# Patient Record
Sex: Male | Born: 1960
Health system: Southern US, Community
[De-identification: ages and names within clinical notes are randomized; demographics above are authoritative.]

## PROBLEM LIST (undated history)

## (undated) DIAGNOSIS — I517 Cardiomegaly: Secondary | ICD-10-CM

## (undated) DIAGNOSIS — Z8719 Personal history of other diseases of the digestive system: Secondary | ICD-10-CM

## (undated) DIAGNOSIS — J189 Pneumonia, unspecified organism: Secondary | ICD-10-CM

## (undated) DIAGNOSIS — R601 Generalized edema: Secondary | ICD-10-CM

## (undated) DIAGNOSIS — N179 Acute kidney failure, unspecified: Secondary | ICD-10-CM

## (undated) DIAGNOSIS — K4020 Bilateral inguinal hernia, without obstruction or gangrene, not specified as recurrent: Secondary | ICD-10-CM

## (undated) DIAGNOSIS — D649 Anemia, unspecified: Secondary | ICD-10-CM

## (undated) DIAGNOSIS — I5189 Other ill-defined heart diseases: Secondary | ICD-10-CM

## (undated) DIAGNOSIS — I10 Essential (primary) hypertension: Secondary | ICD-10-CM

## (undated) DIAGNOSIS — I272 Pulmonary hypertension, unspecified: Secondary | ICD-10-CM

## (undated) DIAGNOSIS — L97909 Non-pressure chronic ulcer of unspecified part of unspecified lower leg with unspecified severity: Secondary | ICD-10-CM

## (undated) DIAGNOSIS — Z9889 Other specified postprocedural states: Secondary | ICD-10-CM

## (undated) DIAGNOSIS — I872 Venous insufficiency (chronic) (peripheral): Secondary | ICD-10-CM

## (undated) DIAGNOSIS — I83009 Varicose veins of unspecified lower extremity with ulcer of unspecified site: Secondary | ICD-10-CM

## (undated) DIAGNOSIS — J45909 Unspecified asthma, uncomplicated: Secondary | ICD-10-CM

## (undated) DIAGNOSIS — Z87898 Personal history of other specified conditions: Secondary | ICD-10-CM

## (undated) DIAGNOSIS — R7989 Other specified abnormal findings of blood chemistry: Secondary | ICD-10-CM

## (undated) DIAGNOSIS — R0600 Dyspnea, unspecified: Secondary | ICD-10-CM

## (undated) DIAGNOSIS — I770 Arteriovenous fistula, acquired: Secondary | ICD-10-CM

## (undated) DIAGNOSIS — M1990 Unspecified osteoarthritis, unspecified site: Secondary | ICD-10-CM

## (undated) DIAGNOSIS — I509 Heart failure, unspecified: Secondary | ICD-10-CM

## (undated) DIAGNOSIS — J90 Pleural effusion, not elsewhere classified: Secondary | ICD-10-CM

## (undated) DIAGNOSIS — K469 Unspecified abdominal hernia without obstruction or gangrene: Secondary | ICD-10-CM

## (undated) DIAGNOSIS — L0390 Cellulitis, unspecified: Secondary | ICD-10-CM

## (undated) DIAGNOSIS — R945 Abnormal results of liver function studies: Secondary | ICD-10-CM

## (undated) DIAGNOSIS — N186 End stage renal disease: Secondary | ICD-10-CM

## (undated) HISTORY — PX: COLONOSCOPY: SHX174

## (undated) HISTORY — DX: Heart failure, unspecified: I50.9

## (undated) HISTORY — DX: Unspecified asthma, uncomplicated: J45.909

## (undated) HISTORY — PX: TOE SURGERY: SHX1073

## (undated) HISTORY — PX: HERNIA REPAIR: SHX51

## (undated) HISTORY — PX: TONSILLECTOMY: SUR1361

---
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
## 1999-01-30 ENCOUNTER — Emergency Department (HOSPITAL_COMMUNITY): Admission: EM | Admit: 1999-01-30 | Discharge: 1999-01-31 | Payer: Self-pay

## 2007-03-26 ENCOUNTER — Emergency Department (HOSPITAL_COMMUNITY): Admission: EM | Admit: 2007-03-26 | Discharge: 2007-03-26 | Payer: Self-pay | Admitting: Emergency Medicine

## 2015-01-12 DIAGNOSIS — N179 Acute kidney failure, unspecified: Secondary | ICD-10-CM

## 2015-01-12 HISTORY — DX: Acute kidney failure, unspecified: N17.9

## 2015-01-29 ENCOUNTER — Encounter (HOSPITAL_COMMUNITY): Payer: Self-pay

## 2015-01-29 ENCOUNTER — Observation Stay (HOSPITAL_COMMUNITY)
Admission: EM | Admit: 2015-01-29 | Discharge: 2015-02-01 | Disposition: A | Discharge status: Home / Self Care | Payer: Self-pay | Attending: Internal Medicine | Admitting: Internal Medicine

## 2015-01-29 ENCOUNTER — Observation Stay (HOSPITAL_COMMUNITY): Payer: Self-pay

## 2015-01-29 DIAGNOSIS — I161 Hypertensive emergency: Principal | ICD-10-CM | POA: Insufficient documentation

## 2015-01-29 DIAGNOSIS — J329 Chronic sinusitis, unspecified: Secondary | ICD-10-CM | POA: Insufficient documentation

## 2015-01-29 DIAGNOSIS — I10 Essential (primary) hypertension: Secondary | ICD-10-CM

## 2015-01-29 DIAGNOSIS — D649 Anemia, unspecified: Secondary | ICD-10-CM | POA: Insufficient documentation

## 2015-01-29 DIAGNOSIS — Z87891 Personal history of nicotine dependence: Secondary | ICD-10-CM | POA: Insufficient documentation

## 2015-01-29 DIAGNOSIS — Z888 Allergy status to other drugs, medicaments and biological substances status: Secondary | ICD-10-CM | POA: Insufficient documentation

## 2015-01-29 DIAGNOSIS — R030 Elevated blood-pressure reading, without diagnosis of hypertension: Secondary | ICD-10-CM

## 2015-01-29 DIAGNOSIS — M109 Gout, unspecified: Secondary | ICD-10-CM | POA: Insufficient documentation

## 2015-01-29 DIAGNOSIS — E876 Hypokalemia: Secondary | ICD-10-CM | POA: Insufficient documentation

## 2015-01-29 DIAGNOSIS — IMO0001 Reserved for inherently not codable concepts without codable children: Secondary | ICD-10-CM

## 2015-01-29 DIAGNOSIS — N179 Acute kidney failure, unspecified: Secondary | ICD-10-CM | POA: Diagnosis present

## 2015-01-29 DIAGNOSIS — D638 Anemia in other chronic diseases classified elsewhere: Secondary | ICD-10-CM | POA: Diagnosis present

## 2015-01-29 DIAGNOSIS — R9431 Abnormal electrocardiogram [ECG] [EKG]: Secondary | ICD-10-CM | POA: Insufficient documentation

## 2015-01-29 DIAGNOSIS — I129 Hypertensive chronic kidney disease with stage 1 through stage 4 chronic kidney disease, or unspecified chronic kidney disease: Secondary | ICD-10-CM | POA: Insufficient documentation

## 2015-01-29 DIAGNOSIS — R748 Abnormal levels of other serum enzymes: Secondary | ICD-10-CM

## 2015-01-29 DIAGNOSIS — N183 Chronic kidney disease, stage 3 (moderate): Secondary | ICD-10-CM | POA: Insufficient documentation

## 2015-01-29 DIAGNOSIS — J069 Acute upper respiratory infection, unspecified: Secondary | ICD-10-CM | POA: Insufficient documentation

## 2015-01-29 HISTORY — DX: Acute kidney failure, unspecified: N17.9

## 2015-01-29 HISTORY — DX: Essential (primary) hypertension: I10

## 2015-01-29 LAB — CBC WITH DIFFERENTIAL/PLATELET
BASOS ABS: 0 10*3/uL (ref 0.0–0.1)
BASOS PCT: 1 %
EOS ABS: 0.6 10*3/uL (ref 0.0–0.7)
Eosinophils Relative: 12 %
HEMATOCRIT: 35.4 % — AB (ref 39.0–52.0)
HEMOGLOBIN: 11.7 g/dL — AB (ref 13.0–17.0)
Lymphocytes Relative: 26 %
Lymphs Abs: 1.3 10*3/uL (ref 0.7–4.0)
MCH: 27.3 pg (ref 26.0–34.0)
MCHC: 33.1 g/dL (ref 30.0–36.0)
MCV: 82.5 fL (ref 78.0–100.0)
MONO ABS: 0.5 10*3/uL (ref 0.1–1.0)
Monocytes Relative: 10 %
NEUTROS ABS: 2.6 10*3/uL (ref 1.7–7.7)
NEUTROS PCT: 51 %
Platelets: 304 10*3/uL (ref 150–400)
RBC: 4.29 MIL/uL (ref 4.22–5.81)
RDW: 12.7 % (ref 11.5–15.5)
WBC: 5 10*3/uL (ref 4.0–10.5)

## 2015-01-29 LAB — URINALYSIS, ROUTINE W REFLEX MICROSCOPIC
Bilirubin Urine: NEGATIVE
Glucose, UA: NEGATIVE mg/dL
Ketones, ur: NEGATIVE mg/dL
LEUKOCYTES UA: NEGATIVE
NITRITE: NEGATIVE
PH: 6 (ref 5.0–8.0)
Protein, ur: 100 mg/dL — AB
SPECIFIC GRAVITY, URINE: 1.014 (ref 1.005–1.030)

## 2015-01-29 LAB — COMPREHENSIVE METABOLIC PANEL
ALBUMIN: 3.5 g/dL (ref 3.5–5.0)
ALT: 10 U/L — ABNORMAL LOW (ref 17–63)
ANION GAP: 11 (ref 5–15)
AST: 13 U/L — AB (ref 15–41)
Alkaline Phosphatase: 49 U/L (ref 38–126)
BILIRUBIN TOTAL: 0.4 mg/dL (ref 0.3–1.2)
BUN: 28 mg/dL — AB (ref 6–20)
CO2: 29 mmol/L (ref 22–32)
Calcium: 9.1 mg/dL (ref 8.9–10.3)
Chloride: 99 mmol/L — ABNORMAL LOW (ref 101–111)
Creatinine, Ser: 2.42 mg/dL — ABNORMAL HIGH (ref 0.61–1.24)
GFR calc Af Amer: 33 mL/min — ABNORMAL LOW (ref >60)
GFR calc non Af Amer: 29 mL/min — ABNORMAL LOW (ref >60)
GLUCOSE: 96 mg/dL (ref 65–99)
POTASSIUM: 3.5 mmol/L (ref 3.5–5.1)
SODIUM: 139 mmol/L (ref 135–145)
TOTAL PROTEIN: 7.7 g/dL (ref 6.5–8.1)

## 2015-01-29 LAB — URINE MICROSCOPIC-ADD ON

## 2015-01-29 LAB — I-STAT TROPONIN, ED: Troponin i, poc: 0.02 ng/mL (ref 0.00–0.08)

## 2015-01-29 MED ORDER — ACETAMINOPHEN 325 MG PO TABS: 650.0000 mg | ORAL_TABLET | Freq: Four times a day (QID) | ORAL | Status: DC | PRN

## 2015-01-29 MED ORDER — AMLODIPINE BESYLATE 10 MG PO TABS
10.0000 mg | ORAL_TABLET | Freq: Every day | ORAL | Status: DC
  Administered 2015-01-30: 10 mg via ORAL
  Filled 2015-01-29: qty 1

## 2015-01-29 MED ORDER — ACETAMINOPHEN 650 MG RE SUPP: 650.0000 mg | Freq: Four times a day (QID) | RECTAL | Status: DC | PRN

## 2015-01-29 MED ORDER — GUAIFENESIN ER 600 MG PO TB12
600.0000 mg | ORAL_TABLET | Freq: Two times a day (BID) | ORAL | Status: DC
  Administered 2015-01-29 – 2015-02-01 (×3): 600 mg via ORAL
  Filled 2015-01-29 (×5): qty 1

## 2015-01-29 MED ORDER — ONDANSETRON HCL 4 MG/2ML IJ SOLN
4.0000 mg | Freq: Four times a day (QID) | INTRAMUSCULAR | Status: DC | PRN
  Filled 2015-01-29: qty 2

## 2015-01-29 MED ORDER — AMLODIPINE BESYLATE 5 MG PO TABS
10.0000 mg | ORAL_TABLET | ORAL | Status: AC
  Administered 2015-01-29: 10 mg via ORAL
  Filled 2015-01-29: qty 2

## 2015-01-29 MED ORDER — LABETALOL HCL 5 MG/ML IV SOLN
10.0000 mg | Freq: Once | INTRAVENOUS | Status: AC
  Administered 2015-01-29: 10 mg via INTRAVENOUS

## 2015-01-29 MED ORDER — LABETALOL HCL 5 MG/ML IV SOLN
10.0000 mg | INTRAVENOUS | Status: DC | PRN
  Administered 2015-01-29 – 2015-01-31 (×5): 10 mg via INTRAVENOUS
  Filled 2015-01-29 (×4): qty 4

## 2015-01-29 MED ORDER — SODIUM CHLORIDE 0.9 % IJ SOLN
3.0000 mL | Freq: Two times a day (BID) | INTRAMUSCULAR | Status: DC
  Administered 2015-01-29 – 2015-01-31 (×4): 3 mL via INTRAVENOUS

## 2015-01-29 MED ORDER — LABETALOL HCL 5 MG/ML IV SOLN
10.0000 mg | Freq: Once | INTRAVENOUS | Status: AC
  Administered 2015-01-29: 10 mg via INTRAVENOUS
  Filled 2015-01-29: qty 4

## 2015-01-29 MED ORDER — ONDANSETRON HCL 4 MG PO TABS: 4.0000 mg | ORAL_TABLET | Freq: Four times a day (QID) | ORAL | Status: DC | PRN

## 2015-01-29 MED ORDER — HEPARIN SODIUM (PORCINE) 5000 UNIT/ML IJ SOLN
5000.0000 [IU] | Freq: Three times a day (TID) | INTRAMUSCULAR | Status: DC
  Administered 2015-01-29 – 2015-01-31 (×5): 5000 [IU] via SUBCUTANEOUS
  Filled 2015-01-29 (×7): qty 1

## 2015-01-29 NOTE — ED Notes (Signed)
MD at bedside. 

## 2015-01-29 NOTE — ED Notes (Signed)
Admitting MD at bedside.

## 2015-01-29 NOTE — Progress Notes (Signed)
Manual BP followed up after PRN given. Manual BP was 210/120. Notified [name], [place]. Will continue to monitor.   Jason [name]

## 2015-01-29 NOTE — H&P (Signed)
[facility] History and Physical  Jason [name] [id number] DOB: Mar 26, 1960 DOA: 01/29/2015  Referring physician: ED PCP: No primary care provider on file.   Chief Complaint: Elevated creatinine  HPI:  Cholesterol is a 55 year old male with a past medical history significant for hypertension; presents from his primary care office with complaints of elevated creatinine. 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 He reports that he's been out of his medications of amlodipine since August 2016. He had been tried on lisinopril, but it caused him to have a chronic cough and this was discontinued. Previously told that he had a bump in his creatinine before, but never returned in ADLs regarding this issue. At the doctor's office when he went today his blood pressure was 215/109. He denies any headache, blurred vision, change in urine output, chest pain, or palpitations. He only complains of having a cold for which she's been using these takes inhalers and drinking plenty of water.  Upon admission into the emergency department patient was seen have a blood pressure as high as 230/154 and creatinine was elevated at 2.42. He was given 20 mg of IV labetalol in the ED.   Review of Systems  Constitutional: Negative for chills and weight loss.  HENT: Negative for hearing loss and tinnitus.   Eyes: Negative for double vision and photophobia.  Respiratory: Negative for hemoptysis and sputum production.   Cardiovascular: Negative for chest pain and palpitations.  Gastrointestinal: Negative for nausea, vomiting and abdominal pain.  Genitourinary: Positive for frequency (patient states he drinks a lot of water). Negative for urgency.  Musculoskeletal: Negative for back pain and neck pain.  Skin: Negative for itching and rash.  Neurological: Negative for tingling, sensory change and speech change.   Endo/Heme/Allergies: Negative for environmental allergies. Does not bruise/bleed easily.  Psychiatric/Behavioral: Negative for suicidal ideas and substance abuse.     Past Medical History  Diagnosis Date  . Hypertension      History reviewed. No pertinent past surgical history.    Social History:  reports that he has quit smoking. He does not have any smokeless tobacco history on file. He reports that he does not drink alcohol. His drug history is not on file.   Allergies  Allergen Reactions  . Lisinopril Cough    No family history on file.      Prior to Admission medications   Not on File     Physical Exam: Filed Vitals:   01/29/15 1830 01/29/15 1840 01/29/15 1845 01/29/15 1915  BP: 189/121 207/112 209/123 181/103  Pulse: 76 77 73 80  Temp:      TempSrc:      Resp: 15 14 14 20   SpO2: 97% 97% 97% 99%     Constitutional: Vital signs reviewed. Patient is a well-developed and well-nourished in no acute distress and cooperative with exam. Alert and oriented x3.  Head: Normocephalic and atraumatic  Ear: TM normal bilaterally  Mouth: no erythema or exudates, MMM  Eyes: PERRL, EOMI, conjunctivae normal, No scleral icterus.  Neck: Supple, Trachea midline normal ROM, No JVD, mass, thyromegaly, or carotid bruit present.  Cardiovascular: RRR, S1 normal, S2 normal, no MRG, pulses symmetric and intact bilaterally  Pulmonary/Chest: CTAB, no wheezes, rales, or rhonchi  Abdominal: Soft. Non-tender, non-distended, bowel sounds are normal, no masses, organomegaly, or guarding present.  GU: no CVA tenderness Musculoskeletal: No joint deformities, erythema, or stiffness,  ROM full and mild tenderness to palpation of the lower right-hand side of the paraspinal muscles.  Ext: no edema and no cyanosis, pulses palpable bilaterally (DP and PT)  Hematology: no cervical, inginal, or axillary adenopathy.  Neurological: A&O x3, Strenght is normal and symmetric bilaterally, cranial nerve  II-XII are grossly intact, no focal motor deficit, sensory intact to light touch bilaterally.  Skin: Warm, dry and intact. No rash, cyanosis, or clubbing.  Psychiatric: Normal mood and affect. speech and behavior is normal. Judgment and thought content normal. Cognition and memory are normal.      Data Review   Micro Results No results found for this or any previous visit (from the past 240 hour(s)).  Radiology Reports No results found.   CBC  Recent Labs Lab 01/29/15 1522  WBC 5.0  HGB 11.7*  HCT 35.4*  PLT 304  MCV 82.5  MCH 27.3  MCHC 33.1  RDW 12.7  LYMPHSABS 1.3  MONOABS 0.5  EOSABS 0.6  BASOSABS 0.0    Chemistries   Recent Labs Lab 01/29/15 1522  NA 139  K 3.5  CL 99*  CO2 29  GLUCOSE 96  BUN 28*  CREATININE 2.42*  CALCIUM 9.1  AST 13*  ALT 10*  ALKPHOS 49  BILITOT 0.4   ------------------------------------------------------------------------------------------------------------------ CrCl cannot be calculated (Unknown ideal weight.). ------------------------------------------------------------------------------------------------------------------ No results for input(s): HGBA1C in the last 72 hours. ------------------------------------------------------------------------------------------------------------------ No results for input(s): CHOL, HDL, LDLCALC, TRIG, CHOLHDL, LDLDIRECT in the last 72 hours. ------------------------------------------------------------------------------------------------------------------ No results for input(s): TSH, T4TOTAL, T3FREE, THYROIDAB in the last 72 hours.  Invalid input(s): FREET3 ------------------------------------------------------------------------------------------------------------------ No results for input(s): VITAMINB12, FOLATE, FERRITIN, TIBC, IRON, RETICCTPCT in the last 72 hours.  Coagulation profile No results for input(s): INR, PROTIME in the last 168 hours.  No results for input(s): DDIMER  in the last 72 hours.  Cardiac Enzymes No results for input(s): CKMB, TROPONINI, MYOGLOBIN in the last 168 hours.  Invalid input(s): CK ------------------------------------------------------------------------------------------------------------------ Invalid input(s): POCBNP   CBG: No results for input(s): GLUCAP in the last 168 hours.     EKG: Independently reviewed. Normal sinus rhythm   Assessment/Plan Principal Problem:    Hypertensive emergency: Acute. Patient with blood pressure is high as 30/154 on admission. Patient notes that he's not been taking his blood pressure medication since August 2016. Previously on amlodipine. Denies any use of over-the-counter cough medication- Admit to a telemetry bed - IV labetalol 10 mg prn q 2hrs sBP greater than 200 - amlodipine 10 mg po qdaily - Labetalol 100 mg by mouth twice a day    Acute kidney injury [facility]): Suspect secondary to patient's hypertension which appears to have been uncontrolled. - Check renal ultrasound - check BMP in am  Anemia: hbg 11.7 on admission - Check TIBC and Iron in am  Upper respiratory infection: Acute. Likely viral in nature versus the possibility of bacterial infection. - Mucinex  Code Status:   full Family Communication: bedside Disposition Plan: admit   Total time spent 55 minutes.Greater than 50% of this time was spent in counseling, explanation of diagnosis, planning of further management, and coordination of care  [facility] Pager [phone]  If 7PM-7AM, please contact night-coverage [email or web address] Password [facility] 01/29/2015, 7:24 PM

## 2015-01-29 NOTE — ED Notes (Signed)
Pt here with high BP and wants his kidney function checked. He went to [facility] on Friday to get prescription for BP meds. They called him with results of blood work and told him to come to ER because his kidney function  was bad and that he had low potassium. Denies CP.

## 2015-01-29 NOTE — ED Provider Notes (Signed)
CSN: [id number]     Arrival date & time 01/29/15  1419 History   First MD Initiated Contact with Patient 01/29/15 1703     Chief Complaint  Patient presents with  . Hypertension  . Abnormal Lab     (Consider location/radiation/quality/duration/timing/severity/associated sxs/prior Treatment) The history is provided by the patient. No language interpreter was used.  Pt is a 55 y.o. male with PMH HTN p/w abnormal lab. States was seen by PCP two days ago for HTN. Was previously on Lisinopril and Amlodipine and had ran out a while back. 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 Currently not on any medications for blood pressure. States was called by PCP Osborne Oman MD) and was told to go to ED because his kidney function was significantly abnormal. He is unsure of what the value was exactly. Otherwise denies chest pain, nausea, vomiting, diarrhea, headache, confusion, dizziness, numbness, tingling, weakness. No change in urination. No recent fevers, chills, SOB. No worsening or alleviating factors. Pt unsure of what his kidney function has been in the past.    Past Medical History  Diagnosis Date  . Hypertension   . AKI (acute kidney injury) ([place]) 01/2015   Past Surgical History  Procedure Laterality Date  . Toe surgery    . Tonsillectomy     Family History  Problem Relation Age of Onset  . Heart attack Mother 39    CABG hx  . Heart disease Mother   . Hypertension Mother   . Healthy Father   . Healthy Brother    Social History  Substance Use Topics  . Smoking status: Former Research scientist (life sciences)  . Smokeless tobacco: Never Used     Comment: quit smoking in 1997  . Alcohol Use: No    Review of Systems  Constitutional: Negative for fever and chills.  HENT: Negative for congestion and rhinorrhea.   Respiratory: Negative for chest tightness and shortness of breath.   Cardiovascular: Negative for chest pain and leg swelling.  Gastrointestinal: Negative for nausea, vomiting, abdominal  pain and diarrhea.  Genitourinary: Negative for dysuria, decreased urine volume and difficulty urinating.  Neurological: Negative for weakness and numbness.  Psychiatric/Behavioral: Negative for confusion and agitation.  All other systems reviewed and are negative.     Allergies  Lisinopril  Home Medications   Prior to Admission medications   Not on File   BP 203/110 mmHg  Pulse 71  Temp(Src) 98.1 F (36.7 C) (Oral)  Resp 15  Ht 6\' 2"  (1.88 m)  Wt 94.484 kg  BMI 26.73 kg/m2  SpO2 100% Physical Exam  Constitutional: He is oriented to person, place, and time. No distress.  HENT:  Head: Normocephalic and atraumatic.  Eyes: Conjunctivae and EOM are normal. Pupils are equal, round, and reactive to light.  Fundoscopic exam:      The right eye shows no hemorrhage.       The left eye shows no hemorrhage.  Neck: Normal range of motion. Neck supple.  Cardiovascular: Normal rate, regular rhythm and normal heart sounds.   No murmur heard. Pulmonary/Chest: Effort normal and breath sounds normal. No respiratory distress. He has no wheezes.  Abdominal: Soft. There is no tenderness.  Musculoskeletal: Normal range of motion. He exhibits no tenderness.  Neurological: He is alert and oriented to person, place, and time. He has normal strength. No cranial nerve deficit (CN II-XII grossly intact). GCS eye subscore is 4. GCS verbal subscore is 5. GCS motor subscore is 6.  Skin: Skin is  warm and dry. He is not diaphoretic.  Psychiatric: He has a normal mood and affect. His behavior is normal.  Nursing note and vitals reviewed.   ED Course  Procedures (including critical care time) Labs Review Labs Reviewed  CBC WITH DIFFERENTIAL/PLATELET - Abnormal; Notable for the following:    Hemoglobin 11.7 (*)    HCT 35.4 (*)    All other components within normal limits  COMPREHENSIVE METABOLIC PANEL - Abnormal; Notable for the following:    Chloride 99 (*)    BUN 28 (*)    Creatinine, Ser 2.42  (*)    AST 13 (*)    ALT 10 (*)    GFR calc non Af Amer 29 (*)    GFR calc Af Amer 33 (*)    All other components within normal limits  URINALYSIS, ROUTINE W REFLEX MICROSCOPIC (NOT AT [facility]) - Abnormal; Notable for the following:    Hgb urine dipstick SMALL (*)    Protein, ur 100 (*)    All other components within normal limits  URINE MICROSCOPIC-ADD ON - Abnormal; Notable for the following:    Squamous Epithelial / LPF 0-5 (*)    Bacteria, UA RARE (*)    All other components within normal limits  BASIC METABOLIC PANEL - Abnormal; Notable for the following:    Potassium 2.9 (*)    CO2 33 (*)    BUN 28 (*)    Creatinine, Ser 2.45 (*)    GFR calc non Af Amer 28 (*)    GFR calc Af Amer 33 (*)    All other components within normal limits  CBC WITH DIFFERENTIAL/PLATELET - Abnormal; Notable for the following:    RBC 4.13 (*)    Hemoglobin 11.3 (*)    HCT 34.2 (*)    All other components within normal limits  IRON AND TIBC - Abnormal; Notable for the following:    Iron 41 (*)    TIBC 183 (*)    All other components within normal limits  POTASSIUM - Abnormal; Notable for the following:    Potassium >7.5 (*)    All other components within normal limits  TROPONIN I - Abnormal; Notable for the following:    Troponin I 0.05 (*)    All other components within normal limits  POTASSIUM - Abnormal; Notable for the following:    Potassium 3.4 (*)    All other components within normal limits  MAGNESIUM  URINE RAPID DRUG SCREEN, HOSP PERFORMED  RETICULOCYTES  TROPONIN I  TROPONIN I  FOLATE  IMMUNOFIXATION ELECTROPHORESIS, URINE (WITH TOT PROT)  PROTEIN ELECTROPHORESIS, SERUM  VITAMIN B12  IRON AND TIBC  FERRITIN  I-STAT TROPOININ, ED    Imaging Review US Renal  01/30/2015  CLINICAL DATA:  55 year old male with acute renal insufficiency. EXAM: RENAL / URINARY TRACT ULTRASOUND COMPLETE COMPARISON:  None. FINDINGS: Right Kidney: Length: 10 cm. The right kidney appears mildly  echogenic. There is no hydronephrosis or echogenic stone. Left Kidney: Length: 10 cm. There is moderate increased echogenicity of the left renal parenchyma. No hydronephrosis or echogenic stone. Bladder: The urinary bladder is only partially distended and appears grossly unremarkable. The prostate gland is mildly enlarged and measures 5 cm in transverse dimension. IMPRESSION: Mild increased renal echogenicity may represent underlying medical renal disease. No hydronephrosis. Electronically Signed   By: Anner Crete M.D.   On: 01/30/2015 03:22   I have personally reviewed and evaluated these images and lab results as part of my medical decision-making.  EKG Interpretation   Date/Time:  Wednesday January 29 2015 15:34:56 EST Ventricular Rate:  70 PR Interval:  146 QRS Duration: 112 QT Interval:  412 QTC Calculation: 444 R Axis:   -5 Text Interpretation:  Normal sinus rhythm Incomplete right bundle branch  block ST \\T \ T wave abnormality, consider inferolateral ischemia Abnormal  ECG No old tracing to compare Confirmed by [facility]  MD, DAVID ([id number]) on  01/29/2015 5:46:16 PM      MDM   Final diagnoses:  Elevated creatine kinase  Elevated blood pressure    Pt presents today with reported abnormal kidney function on OSH lab. Tried to obtain these records via [place] but was unable to do so. Creatinine is 2.42 here with any prior for comparison. No clear indication whether this is acute or not. Pt otherwise does not have evidence of end organ damage -- no AMS, chest pain, ST elevation/depression on EKG. Troponin is negative. Given that it is unclear if this is HTN emergency vs urgency we discussed with medicine who will admit for further evaluation and management. While in ED given Labetalol 10 mg IV twice with good response in BP. Pt stable at time of admission to medicine.    Theodosia Quay, MD [id number]  David Glick, MD [id number]

## 2015-01-30 ENCOUNTER — Ambulatory Visit (HOSPITAL_COMMUNITY): Payer: Self-pay

## 2015-01-30 ENCOUNTER — Encounter (HOSPITAL_COMMUNITY): Payer: Self-pay | Admitting: General Practice

## 2015-01-30 DIAGNOSIS — E876 Hypokalemia: Secondary | ICD-10-CM | POA: Diagnosis present

## 2015-01-30 DIAGNOSIS — I161 Hypertensive emergency: Secondary | ICD-10-CM

## 2015-01-30 DIAGNOSIS — D649 Anemia, unspecified: Secondary | ICD-10-CM

## 2015-01-30 DIAGNOSIS — N179 Acute kidney failure, unspecified: Secondary | ICD-10-CM

## 2015-01-30 DIAGNOSIS — J069 Acute upper respiratory infection, unspecified: Secondary | ICD-10-CM

## 2015-01-30 DIAGNOSIS — R9431 Abnormal electrocardiogram [ECG] [EKG]: Secondary | ICD-10-CM | POA: Diagnosis present

## 2015-01-30 LAB — RAPID URINE DRUG SCREEN, HOSP PERFORMED
Amphetamines: NOT DETECTED
Barbiturates: NOT DETECTED
Benzodiazepines: NOT DETECTED
COCAINE: NOT DETECTED
OPIATES: NOT DETECTED
Tetrahydrocannabinol: NOT DETECTED

## 2015-01-30 LAB — CBC WITH DIFFERENTIAL/PLATELET
BASOS ABS: 0 10*3/uL (ref 0.0–0.1)
Basophils Relative: 0 %
Eosinophils Absolute: 0.7 10*3/uL (ref 0.0–0.7)
Eosinophils Relative: 14 %
HEMATOCRIT: 34.2 % — AB (ref 39.0–52.0)
Hemoglobin: 11.3 g/dL — ABNORMAL LOW (ref 13.0–17.0)
LYMPHS ABS: 1.4 10*3/uL (ref 0.7–4.0)
LYMPHS PCT: 29 %
MCH: 27.4 pg (ref 26.0–34.0)
MCHC: 33 g/dL (ref 30.0–36.0)
MCV: 82.8 fL (ref 78.0–100.0)
MONO ABS: 0.7 10*3/uL (ref 0.1–1.0)
Monocytes Relative: 14 %
NEUTROS ABS: 2.1 10*3/uL (ref 1.7–7.7)
Neutrophils Relative %: 43 %
Platelets: 253 10*3/uL (ref 150–400)
RBC: 4.13 MIL/uL — AB (ref 4.22–5.81)
RDW: 12.7 % (ref 11.5–15.5)
WBC: 4.8 10*3/uL (ref 4.0–10.5)

## 2015-01-30 LAB — POTASSIUM
POTASSIUM: 3.4 mmol/L — AB (ref 3.5–5.1)
Potassium: >7.5 mmol/L (ref 3.5–5.1)

## 2015-01-30 LAB — RETICULOCYTES
RBC.: 4.31 MIL/uL (ref 4.22–5.81)
RETIC COUNT ABSOLUTE: 81.9 10*3/uL (ref 19.0–186.0)
RETIC CT PCT: 1.9 % (ref 0.4–3.1)

## 2015-01-30 LAB — BASIC METABOLIC PANEL
ANION GAP: 7 (ref 5–15)
BUN: 28 mg/dL — ABNORMAL HIGH (ref 6–20)
CHLORIDE: 101 mmol/L (ref 101–111)
CO2: 33 mmol/L — ABNORMAL HIGH (ref 22–32)
Calcium: 8.9 mg/dL (ref 8.9–10.3)
Creatinine, Ser: 2.45 mg/dL — ABNORMAL HIGH (ref 0.61–1.24)
GFR calc Af Amer: 33 mL/min — ABNORMAL LOW (ref >60)
GFR, EST NON AFRICAN AMERICAN: 28 mL/min — AB (ref >60)
GLUCOSE: 93 mg/dL (ref 65–99)
POTASSIUM: 2.9 mmol/L — AB (ref 3.5–5.1)
Sodium: 141 mmol/L (ref 135–145)

## 2015-01-30 LAB — FERRITIN: Ferritin: 348 ng/mL — ABNORMAL HIGH (ref 24–336)

## 2015-01-30 LAB — IRON AND TIBC
Iron: 41 ug/dL — ABNORMAL LOW (ref 45–182)
Iron: 42 ug/dL — ABNORMAL LOW (ref 45–182)
SATURATION RATIOS: 22 % (ref 17.9–39.5)
SATURATION RATIOS: 23 % (ref 17.9–39.5)
TIBC: 183 ug/dL — ABNORMAL LOW (ref 250–450)
TIBC: 181 ug/dL — ABNORMAL LOW (ref 250–450)
UIBC: 142 ug/dL
UIBC: 139 ug/dL

## 2015-01-30 LAB — FOLATE: FOLATE: 11.2 ng/mL (ref >5.9)

## 2015-01-30 LAB — TROPONIN I
TROPONIN I: 0.04 ng/mL — AB (ref <0.031)
Troponin I: 0.05 ng/mL — ABNORMAL HIGH (ref <0.031)

## 2015-01-30 LAB — MAGNESIUM: Magnesium: 2.1 mg/dL (ref 1.7–2.4)

## 2015-01-30 LAB — VITAMIN B12: Vitamin B-12: 196 pg/mL (ref 180–914)

## 2015-01-30 MED ORDER — METOPROLOL TARTRATE 50 MG PO TABS
50.0000 mg | ORAL_TABLET | Freq: Four times a day (QID) | ORAL | Status: DC
  Administered 2015-01-30: 50 mg via ORAL
  Filled 2015-01-30: qty 1

## 2015-01-30 MED ORDER — ASPIRIN EC 81 MG PO TBEC
81.0000 mg | DELAYED_RELEASE_TABLET | Freq: Every day | ORAL | Status: DC
  Administered 2015-01-30 – 2015-02-01 (×3): 81 mg via ORAL
  Filled 2015-01-30 (×3): qty 1

## 2015-01-30 MED ORDER — LABETALOL HCL 100 MG PO TABS
100.0000 mg | ORAL_TABLET | Freq: Two times a day (BID) | ORAL | Status: DC
  Administered 2015-01-30: 100 mg via ORAL
  Filled 2015-01-30 (×2): qty 1

## 2015-01-30 MED ORDER — POTASSIUM CHLORIDE 10 MEQ/100ML IV SOLN
10.0000 meq | INTRAVENOUS | Status: AC
  Administered 2015-01-30 (×2): 10 meq via INTRAVENOUS
  Filled 2015-01-30 (×3): qty 100

## 2015-01-30 MED ORDER — HYDRALAZINE HCL 50 MG PO TABS
50.0000 mg | ORAL_TABLET | Freq: Three times a day (TID) | ORAL | Status: DC
  Administered 2015-01-30 – 2015-01-31 (×4): 50 mg via ORAL
  Filled 2015-01-30 (×4): qty 1

## 2015-01-30 MED ORDER — HYDRALAZINE HCL 20 MG/ML IJ SOLN
10.0000 mg | Freq: Four times a day (QID) | INTRAMUSCULAR | Status: DC | PRN
  Administered 2015-01-30 – 2015-01-31 (×4): 10 mg via INTRAVENOUS
  Filled 2015-01-30 (×3): qty 1

## 2015-01-30 MED ORDER — POTASSIUM CHLORIDE 10 MEQ/100ML IV SOLN: 10.0000 meq | INTRAVENOUS | Status: DC

## 2015-01-30 MED ORDER — LORATADINE 10 MG PO TABS
10.0000 mg | ORAL_TABLET | Freq: Every day | ORAL | Status: DC
  Administered 2015-02-01: 10 mg via ORAL
  Filled 2015-01-30 (×3): qty 1

## 2015-01-30 MED ORDER — POTASSIUM CHLORIDE CRYS ER 20 MEQ PO TBCR
30.0000 meq | EXTENDED_RELEASE_TABLET | ORAL | Status: AC
  Administered 2015-01-30 (×2): 30 meq via ORAL
  Filled 2015-01-30 (×2): qty 1

## 2015-01-30 MED ORDER — FLUTICASONE PROPIONATE 50 MCG/ACT NA SUSP
1.0000 | Freq: Every day | NASAL | Status: DC
Start: 2015-01-30 — End: 2015-02-01
  Filled 2015-01-30: qty 16

## 2015-01-30 MED ORDER — METOPROLOL TARTRATE 50 MG PO TABS
50.0000 mg | ORAL_TABLET | Freq: Two times a day (BID) | ORAL | Status: DC
  Administered 2015-01-30: 50 mg via ORAL
  Filled 2015-01-30: qty 1

## 2015-01-30 NOTE — Progress Notes (Signed)
Pt K this am is 2.9. Jason [name], [place] notified. Pt asymptomatic.   Will continue to monitor.   [name]

## 2015-01-30 NOTE — Consult Note (Signed)
Reason for Consult: abnormal EKG   Referring Physician: Dr. Tana Coast   PCP:  No primary care provider on file.  Primary Cardiologist:NEW  Jason [name] is an 55 y.o. male.    Chief Complaint: admitted from MD office with BP 230/83   HPI:  55 year old male with no prior cardiac hx presents with HTN from MD office.  Pt had not taken meds in awhile but was seeing MD for DOT physical.  Cr on admit was 2.42-today 2.45.  Troponin 0.02.   Pt denies any chest pain or SOB.  He has not had any headaches with his elevated BP- though he has had in the past.   EKG on admit with T wave inversion inf. Lat and ST elevation V1 today with deeper T wave inversions in lat leads. +LVH.  Follow up troponin in process.   Past Medical History  Diagnosis Date  . Hypertension   . AKI (acute kidney injury) ([place]) 01/2015    Past Surgical History  Procedure Laterality Date  . Toe surgery    . Tonsillectomy      Family History  Problem Relation Age of Onset  . Heart attack Mother 77    CABG hx  . Heart disease Mother   . Hypertension Mother   . Healthy Father   . Healthy Brother    Social History:  reports that he has quit smoking. He has never used smokeless tobacco. He reports that he does not drink alcohol or use illicit drugs.  Allergies:  Allergies  Allergen Reactions  . Lisinopril Cough    OUTPATIENT MEDICATIONS: NONE  CURRENT MEDICATIONS: Scheduled Meds: . amLODipine  10 mg Oral Daily  . aspirin EC  81 mg Oral Daily  . fluticasone  1 spray Each Nare Daily  . guaiFENesin  600 mg Oral BID  . heparin  5,000 Units Subcutaneous 3 times per day  . hydrALAZINE  50 mg Oral 3 times per day  . loratadine  10 mg Oral Daily  . metoprolol tartrate  50 mg Oral BID  . potassium chloride  30 mEq Oral Q4H  . potassium chloride  10 mEq Intravenous Q1 Hr x 2  . sodium chloride  3 mL Intravenous Q12H   Continuous Infusions:  PRN Meds:.acetaminophen **OR** acetaminophen, hydrALAZINE,  labetalol, ondansetron **OR** ondansetron (ZOFRAN) IV   Results for orders placed or performed during the hospital encounter of 01/29/15 (from the past 48 hour(s))  CBC with Differential     Status: Abnormal   Collection Time: 01/29/15  3:22 PM  Result Value Ref Range   WBC 5.0 4.0 - 10.5 K/uL   RBC 4.29 4.22 - 5.81 MIL/uL   Hemoglobin 11.7 (L) 13.0 - 17.0 g/dL   HCT 35.4 (L) 39.0 - 52.0 %   MCV 82.5 78.0 - 100.0 fL   MCH 27.3 26.0 - 34.0 pg   MCHC 33.1 30.0 - 36.0 g/dL   RDW 12.7 11.5 - 15.5 %   Platelets 304 150 - 400 K/uL   Neutrophils Relative % 51 %   Neutro Abs 2.6 1.7 - 7.7 K/uL   Lymphocytes Relative 26 %   Lymphs Abs 1.3 0.7 - 4.0 K/uL   Monocytes Relative 10 %   Monocytes Absolute 0.5 0.1 - 1.0 K/uL   Eosinophils Relative 12 %   Eosinophils Absolute 0.6 0.0 - 0.7 K/uL   Basophils Relative 1 %   Basophils Absolute 0.0 0.0 - 0.1 K/uL  Comprehensive metabolic panel     Status: Abnormal   Collection Time: 01/29/15  3:22 PM  Result Value Ref Range   Sodium 139 135 - 145 mmol/L   Potassium 3.5 3.5 - 5.1 mmol/L   Chloride 99 (L) 101 - 111 mmol/L   CO2 29 22 - 32 mmol/L   Glucose, Bld 96 65 - 99 mg/dL   BUN 28 (H) 6 - 20 mg/dL   Creatinine, Ser 2.42 (H) 0.61 - 1.24 mg/dL   Calcium 9.1 8.9 - 10.3 mg/dL   Total Protein 7.7 6.5 - 8.1 g/dL   Albumin 3.5 3.5 - 5.0 g/dL   AST 13 (L) 15 - 41 U/L   ALT 10 (L) 17 - 63 U/L   Alkaline Phosphatase 49 38 - 126 U/L   Total Bilirubin 0.4 0.3 - 1.2 mg/dL   GFR calc non Af Amer 29 (L) >60 mL/min   GFR calc Af Amer 33 (L) >60 mL/min    Comment: (NOTE) The eGFR has been calculated using the CKD EPI equation. This calculation has not been validated in all clinical situations. eGFR's persistently <60 mL/min signify possible Chronic Kidney Disease.    Anion gap 11 5 - 15  Urinalysis, Routine w reflex microscopic (not at [facility])     Status: Abnormal   Collection Time: 01/29/15  6:02 PM  Result Value Ref Range   Color, Urine YELLOW  YELLOW   APPearance CLEAR CLEAR   Specific Gravity, Urine 1.014 1.005 - 1.030   pH 6.0 5.0 - 8.0   Glucose, UA NEGATIVE NEGATIVE mg/dL   Hgb urine dipstick SMALL (A) NEGATIVE   Bilirubin Urine NEGATIVE NEGATIVE   Ketones, ur NEGATIVE NEGATIVE mg/dL   Protein, ur 100 (A) NEGATIVE mg/dL   Nitrite NEGATIVE NEGATIVE   Leukocytes, UA NEGATIVE NEGATIVE  Urine microscopic-add on     Status: Abnormal   Collection Time: 01/29/15  6:02 PM  Result Value Ref Range   Squamous Epithelial / LPF 0-5 (A) NONE SEEN   WBC, UA 0-5 0 - 5 WBC/hpf   RBC / HPF 6-30 0 - 5 RBC/hpf   Bacteria, UA RARE (A) NONE SEEN  I-Stat Troponin, ED (not at [facility])     Status: None   Collection Time: 01/29/15  6:09 PM  Result Value Ref Range   Troponin i, poc 0.02 0.00 - 0.08 ng/mL   Comment 3            Comment: Due to the release kinetics of cTnI, a negative result within the first hours of the onset of symptoms does not rule out myocardial infarction with certainty. If myocardial infarction is still suspected, repeat the test at appropriate intervals.   Basic metabolic panel     Status: Abnormal   Collection Time: 01/30/15  2:50 AM  Result Value Ref Range   Sodium 141 135 - 145 mmol/L   Potassium 2.9 (L) 3.5 - 5.1 mmol/L    Comment: DELTA CHECK NOTED   Chloride 101 101 - 111 mmol/L   CO2 33 (H) 22 - 32 mmol/L   Glucose, Bld 93 65 - 99 mg/dL   BUN 28 (H) 6 - 20 mg/dL   Creatinine, Ser 2.45 (H) 0.61 - 1.24 mg/dL   Calcium 8.9 8.9 - 10.3 mg/dL   GFR calc non Af Amer 28 (L) >60 mL/min   GFR calc Af Amer 33 (L) >60 mL/min    Comment: (NOTE) The eGFR has been calculated using the CKD EPI equation. This  calculation has not been validated in all clinical situations. eGFR's persistently <60 mL/min signify possible Chronic Kidney Disease.    Anion gap 7 5 - 15  CBC with Differential/Platelet     Status: Abnormal   Collection Time: 01/30/15  2:50 AM  Result Value Ref Range   WBC 4.8 4.0 - 10.5 K/uL   RBC 4.13 (L)  4.22 - 5.81 MIL/uL   Hemoglobin 11.3 (L) 13.0 - 17.0 g/dL   HCT 34.2 (L) 39.0 - 52.0 %   MCV 82.8 78.0 - 100.0 fL   MCH 27.4 26.0 - 34.0 pg   MCHC 33.0 30.0 - 36.0 g/dL   RDW 12.7 11.5 - 15.5 %   Platelets 253 150 - 400 K/uL   Neutrophils Relative % 43 %   Neutro Abs 2.1 1.7 - 7.7 K/uL   Lymphocytes Relative 29 %   Lymphs Abs 1.4 0.7 - 4.0 K/uL   Monocytes Relative 14 %   Monocytes Absolute 0.7 0.1 - 1.0 K/uL   Eosinophils Relative 14 %   Eosinophils Absolute 0.7 0.0 - 0.7 K/uL   Basophils Relative 0 %   Basophils Absolute 0.0 0.0 - 0.1 K/uL  Iron and TIBC     Status: Abnormal   Collection Time: 01/30/15  2:50 AM  Result Value Ref Range   Iron 41 (L) 45 - 182 ug/dL   TIBC 183 (L) 250 - 450 ug/dL   Saturation Ratios 22 17.9 - 39.5 %   UIBC 142 ug/dL  Magnesium     Status: None   Collection Time: 01/30/15  7:08 AM  Result Value Ref Range   Magnesium 2.1 1.7 - 2.4 mg/dL  Urine rapid drug screen (hosp performed)     Status: None   Collection Time: 01/30/15  8:46 AM  Result Value Ref Range   Opiates NONE DETECTED NONE DETECTED   Cocaine NONE DETECTED NONE DETECTED   Benzodiazepines NONE DETECTED NONE DETECTED   Amphetamines NONE DETECTED NONE DETECTED   Tetrahydrocannabinol NONE DETECTED NONE DETECTED   Barbiturates NONE DETECTED NONE DETECTED    Comment:        DRUG SCREEN FOR MEDICAL PURPOSES ONLY.  IF CONFIRMATION IS NEEDED FOR ANY PURPOSE, NOTIFY LAB WITHIN 5 DAYS.        LOWEST DETECTABLE LIMITS FOR URINE DRUG SCREEN Drug Class       Cutoff (ng/mL) Amphetamine      1000 Barbiturate      200 Benzodiazepine   200 Tricyclics       300 Opiates          300 Cocaine          300 THC              50   Reticulocytes     Status: None   Collection Time: 01/30/15 12:01 PM  Result Value Ref Range   Retic Ct Pct 1.9 0.4 - 3.1 %   RBC. 4.31 4.22 - 5.81 MIL/uL   Retic Count, Manual 81.9 19.0 - 186.0 K/uL   Us Renal  01/30/2015  CLINICAL DATA:  54-year-old male with  acute renal insufficiency. EXAM: RENAL / URINARY TRACT ULTRASOUND COMPLETE COMPARISON:  None. FINDINGS: Right Kidney: Length: 10 cm. The right kidney appears mildly echogenic. There is no hydronephrosis or echogenic stone. Left Kidney: Length: 10 cm. There is moderate increased echogenicity of the left renal parenchyma. No hydronephrosis or echogenic stone. Bladder: The urinary bladder is only partially distended and appears grossly unremarkable. The prostate   gland is mildly enlarged and measures 5 cm in transverse dimension. IMPRESSION: Mild increased renal echogenicity may represent underlying medical renal disease. No hydronephrosis. Electronically Signed   By: Arash  Radparvar M.D.   On: 01/30/2015 03:22    ROS: General:no colds or fevers, no weight changes Skin:no rashes or ulcers HEENT:no blurred vision, no congestion CV:see HPI PUL:see HPI GI:no diarrhea constipation or melena, no indigestion GU:no hematuria, no dysuria MS:no joint pain, no claudication Neuro:no syncope, no lightheadedness Endo:no diabetes, no thyroid disease   Blood pressure 184/113, pulse 73, temperature 98.1 F (36.7 C), temperature source Oral, resp. rate 15, height 6' 2" (1.88 m), weight 208 lb 4.8 oz (94.484 kg), SpO2 100 %.  Wt Readings from Last 3 Encounters:  01/29/15 208 lb 4.8 oz (94.484 kg)    PE: General:Pleasant affect, NAD Skin:Warm and dry, brisk capillary refill HEENT:normocephalic, sclera clear, mucus membranes moist Neck:supple, no JVD, no bruits  Heart:S1S2 RRR without murmur, gallup, rub or click Lungs:clear without rales, rhonchi, or wheezes Abd:soft, non tender, + BS, do not palpate liver spleen or masses Ext:no lower ext edema, 2+ pedal pulses, 2+ radial pulses Neuro:alert and oriented X 3, MAE, follows commands, + facial symmetry    Assessment/Plan Principal Problem:   Hypertensive emergency Active Problems:   Acute kidney injury (HCC)   Anemia   Acute upper respiratory  infection   Abnormal EKG   Hypokalemia  Abnormal EKG with T wave inversion inf. Lat leads, may be due to LVH, troponin this am P.   Agree with echo-pending.  No chest pain.   HTN- slowly coming down now 184/113-107 , now on amlodipine 10 mg, asa 81 mg, hydralazine 50 mg TID, lopressor 50 mg BID ( has received several doses of labetalol last pm and a dose of amlodipine yesterday.)  Hypokalemia- being replaced.  Acute renal failure- with renal ultrasound Mild increased renal echogenicity may represent underlying medical renal disease.  Per IM  Mild anemia most likely secondary to chronic renal disease-per IM     INGOLD,LAURA R  Nurse Practitioner Certified [facility] Pager [phone] or after 5pm or weekends call [phone] 01/30/2015, 12:57 PM     History and all data above reviewed.  Patient examined.  I agree with the findings as above.  The patient has hypertension that has been difficult to control.  Part of the issue has been related to his inability to afford meds.  He denies chest pain or SOB.  Was referred because of CKD and severely elevated BP.  The patient exam reveals COR:RRR  ,  Lungs: Clear  ,  Abd: Positive bowel sounds, no rebound no guarding, Ext No edema  .  All available labs, radiology testing, previous records reviewed. Agree with documented assessment and plan. HTN:  Difficult to control HTN.  At this point I will increase the beta blocker.  No evidence for a secondary cause.  Abnormal EKG reflects hypertensive effects.  Echo pending.    James Hochrein  5:59 PM  01/30/2015  

## 2015-01-30 NOTE — Care Management Note (Signed)
Case Management Note Marvetta Gibbons RN, BSN Unit 2W-Case Manager [phone]  Patient Details  Name: Jason [name] MRN: [id number] Date of Birth: [birth year]  Subjective/Objective:  Pt admitted with Hypertensive emergency                  Action/Plan: PTA pt lived at home- referral for potential medication needs-  Spoke with pt at bedside- per conversation pt states that he has been going to [facility] when he needs to-does not have a PCP- is agreeable to establishing with [facility] for PCP (explained that [facility] is not currently taking new pt) he can however still use their pharmacy for medication needs- appointment made for Feb. 15 at 9:15 and info given to pt for both clinics. CM will continue to follow for any additional medication needs at discharge, pt would be eligible for [facility] if needed.   Expected Discharge Date:                  Expected Discharge Plan:  Home/Self Care  In-House Referral:     Discharge planning Services  CM Consult, Follow-up appt scheduled, Medication Assistance  Post Acute Care Choice:    Choice offered to:     DME Arranged:    DME Agency:     HH Arranged:    HH Agency:     Status of Service:  In process, will continue to follow  Medicare Important Message Given:    Date Medicare IM Given:    Medicare IM give by:    Date Additional Medicare IM Given:    Additional Medicare Important Message give by:     If discussed at [place] of Stay Meetings, dates discussed:    Additional Comments:  Canyon, Langston, RN 01/30/2015, 10:37 AM

## 2015-01-30 NOTE — Progress Notes (Addendum)
[facility]                                                                              Patient Demographics  Jason [name], is a 55 y.o. male, DOB - Jul 06, 1960, [id number]  Admit date - 01/29/2015   Admitting Physician Norval Morton, MD  Outpatient Primary MD for the patient is No primary care provider on file.  LOS -    Chief Complaint  Patient presents with  . Hypertension  . Abnormal Lab       Brief HPI   Patient is a 55 year old male with a past medical history significant for hypertension; presented from urgent care with complaints of elevated creatinine. 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 He reported that he had been out of his medications of amlodipine since August 2016. He had been tried on lisinopril, but it caused him to have a chronic cough and this was discontinued. Previously told that he had a bump in his creatinine before, but never returned to PCP regarding this issue. At the doctor's office when he went today his blood pressure was 215/109. He denied any headache, blurred vision, change in urine output, chest pain, or palpitations. In ED, BP was 230/154 and creatinine was elevated at 2.42. He was given 20 mg of IV labetalol in the ED. Patient was admitted for further workup   Assessment & Plan    Principal Problem:   Hypertensive emergency with end organ damage - Patient started on amlodipine 10mg , hydralazine 50mg  TID, beta blocker 50mg  BID. BP still elevated. - 2-D echo ordered - Currently asymptomatic, no headache, blurry vision chest pain or shortness of breath - UDS negative  Active Problems:   Abnormal EKG - EKG showed LVH with repolarization changes, T-wave inversions in lateral leads, inferior leads - Obtain 2-D echocardiogram, cardiology consult - Troponin 1 negative. Obtain serial troponins    Acute kidney injury ([place]) -  Patient has likely chronic kidney disease, renal ultrasound showed no hydronephrosis, consistent with chronic medical renal disease    Anemia - Likely anemia of chronic renal disease, will check anemia panel, SPEP, UPEP, urine lites    Acute upper respiratory infection/sinusitis - Continue Flonase, Claritin    Hypokalemia - replaced   Code Status: Full CODE STATUS  Family Communication: Discussed in detail with the patient, all imaging results, lab results explained to the patient    Disposition Plan:   Time Spent in minutes  25 minutes  Procedures  Renal ultrasound  Consults   Cardiology  DVT Prophylaxis  heparin   Medications  Scheduled Meds: . amLODipine  10 mg Oral Daily  . aspirin EC  81 mg Oral Daily  . fluticasone  1 spray Each Nare Daily  . guaiFENesin  600 mg Oral BID  . heparin  5,000 Units Subcutaneous 3 times per day  . hydrALAZINE  50 mg Oral 3 times per day  . loratadine  10 mg Oral Daily  . metoprolol tartrate  50 mg Oral BID  . potassium chloride  30 mEq Oral Q4H  . sodium chloride  3 mL Intravenous Q12H   Continuous Infusions:  PRN Meds:.acetaminophen **OR** acetaminophen, hydrALAZINE, labetalol, ondansetron **OR** ondansetron (ZOFRAN) IV   Antibiotics   Anti-infectives    None        Subjective:   Jason [name] was seen and examined today. Patient denies dizziness, chest pain, shortness of breath, abdominal pain, N/V/D/C, new weakness, numbess, tingling. No acute events overnight.    Objective:   Blood pressure 184/113, pulse 73, temperature 98.1 F (36.7 C), temperature source Oral, resp. rate 15, height 6\' 2"  (1.88 m), weight 94.484 kg (208 lb 4.8 oz), SpO2 100 %.  Wt Readings from Last 3 Encounters:  01/29/15 94.484 kg (208 lb 4.8 oz)     Intake/Output Summary (Last 24 hours) at 01/30/15 1125 Last data filed at 01/30/15 0955  Gross per 24 hour  Intake    363 ml  Output    550 ml  Net   -187 ml    Exam  General: Alert  and oriented x 3, NAD  HEENT:  PERRLA, EOMI, Anicteric Sclera, mucous membranes moist.   Neck: Supple, no JVD, no masses  CVS: S1 S2 auscultated, no rubs, murmurs or gallops. Regular rate and rhythm.  Respiratory: Clear to auscultation bilaterally, no wheezing, rales or rhonchi  Abdomen: Soft, nontender, nondistended, + bowel sounds  Ext: no cyanosis clubbing or edema  Neuro: AAOx3, Cr N's II- XII. Strength 5/5 upper and lower extremities bilaterally  Skin: No rashes  Psych: Normal affect and demeanor, alert and oriented x3    Data Review   Micro Results No results found for this or any previous visit (from the past 240 hour(s)).  Radiology Reports US Renal  01/30/2015  CLINICAL DATA:  55 year old male with acute renal insufficiency. EXAM: RENAL / URINARY TRACT ULTRASOUND COMPLETE COMPARISON:  None. FINDINGS: Right Kidney: Length: 10 cm. The right kidney appears mildly echogenic. There is no hydronephrosis or echogenic stone. Left Kidney: Length: 10 cm. There is moderate increased echogenicity of the left renal parenchyma. No hydronephrosis or echogenic stone. Bladder: The urinary bladder is only partially distended and appears grossly unremarkable. The prostate gland is mildly enlarged and measures 5 cm in transverse dimension. IMPRESSION: Mild increased renal echogenicity may represent underlying medical renal disease. No hydronephrosis. Electronically Signed   By: Anner Crete M.D.   On: 01/30/2015 03:22    CBC  Recent Labs Lab 01/29/15 1522 01/30/15 0250  WBC 5.0 4.8  HGB 11.7* 11.3*  HCT 35.4* 34.2*  PLT 304 253  MCV 82.5 82.8  MCH 27.3 27.4  MCHC 33.1 33.0  RDW 12.7 12.7  LYMPHSABS 1.3 1.4  MONOABS 0.5 0.7  EOSABS 0.6 0.7  BASOSABS 0.0 0.0    Chemistries   Recent Labs Lab 01/29/15 1522 01/30/15 0250 01/30/15 0708  NA 139 141  --   K 3.5 2.9*  --   CL 99* 101  --   CO2 29 33*  --   GLUCOSE 96 93  --   BUN 28* 28*  --   CREATININE 2.42* 2.45*   --   CALCIUM 9.1 8.9  --   MG  --   --  2.1  AST 13*  --   --   ALT 10*  --   --   ALKPHOS 49  --   --   BILITOT 0.4  --   --    ------------------------------------------------------------------------------------------------------------------ estimated creatinine clearance is 40.5 mL/min (by C-G formula based  on Cr of 2.45). ------------------------------------------------------------------------------------------------------------------ No results for input(s): HGBA1C in the last 72 hours. ------------------------------------------------------------------------------------------------------------------ No results for input(s): CHOL, HDL, LDLCALC, TRIG, CHOLHDL, LDLDIRECT in the last 72 hours. ------------------------------------------------------------------------------------------------------------------ No results for input(s): TSH, T4TOTAL, T3FREE, THYROIDAB in the last 72 hours.  Invalid input(s): FREET3 ------------------------------------------------------------------------------------------------------------------  Recent Labs  01/30/15 0250  TIBC 183*  IRON 41*    Coagulation profile No results for input(s): INR, PROTIME in the last 168 hours.  No results for input(s): DDIMER in the last 72 hours.  Cardiac Enzymes No results for input(s): CKMB, TROPONINI, MYOGLOBIN in the last 168 hours.  Invalid input(s): CK ------------------------------------------------------------------------------------------------------------------ Invalid input(s): POCBNP  No results for input(s): GLUCAP in the last 72 hours.   Hilma Steinhilber M.D. [facility] 01/30/2015, 11:25 AM  Pager: [phone] Between 7am to 7pm - call Pager - [phone]  After 7pm go to [email or web address] - password [email or web address]  Call night coverage person covering after 7pm

## 2015-01-31 ENCOUNTER — Ambulatory Visit (HOSPITAL_BASED_OUTPATIENT_CLINIC_OR_DEPARTMENT_OTHER): Payer: Self-pay

## 2015-01-31 DIAGNOSIS — I421 Obstructive hypertrophic cardiomyopathy: Secondary | ICD-10-CM

## 2015-01-31 DIAGNOSIS — I10 Essential (primary) hypertension: Secondary | ICD-10-CM

## 2015-01-31 LAB — UIFE/LIGHT CHAINS/TP QN, 24-HR UR
% BETA, URINE: 11.4 %
ALBUMIN, U: 64.7 %
ALPHA 1 URINE: 4.6 %
Alpha 2, Urine: 4.8 %
Free Kappa/Lambda Ratio: 14.57 — ABNORMAL HIGH (ref 2.04–10.37)
Free Lambda Lt Chains,Ur: 16.4 mg/L — ABNORMAL HIGH (ref 0.24–6.66)
Free Lt Chn Excr Rate: 239 mg/L — ABNORMAL HIGH (ref 1.35–24.19)
GAMMA GLOBULIN URINE: 14.5 %
TOTAL PROTEIN, URINE-UPE24: 71 mg/dL

## 2015-01-31 LAB — IRON AND TIBC
Iron: 98 ug/dL (ref 45–182)
SATURATION RATIOS: 45 % — AB (ref 17.9–39.5)
TIBC: 217 ug/dL — ABNORMAL LOW (ref 250–450)
UIBC: 119 ug/dL

## 2015-01-31 LAB — TROPONIN I: TROPONIN I: 0.03 ng/mL (ref <0.031)

## 2015-01-31 LAB — BASIC METABOLIC PANEL
ANION GAP: 10 (ref 5–15)
BUN: 29 mg/dL — ABNORMAL HIGH (ref 6–20)
CHLORIDE: 100 mmol/L — AB (ref 101–111)
CO2: 30 mmol/L (ref 22–32)
Calcium: 8.9 mg/dL (ref 8.9–10.3)
Creatinine, Ser: 2.54 mg/dL — ABNORMAL HIGH (ref 0.61–1.24)
GFR calc Af Amer: 32 mL/min — ABNORMAL LOW (ref >60)
GFR, EST NON AFRICAN AMERICAN: 27 mL/min — AB (ref >60)
GLUCOSE: 139 mg/dL — AB (ref 65–99)
POTASSIUM: 3.1 mmol/L — AB (ref 3.5–5.1)
Sodium: 140 mmol/L (ref 135–145)

## 2015-01-31 LAB — PROTEIN ELECTROPHORESIS, SERUM
A/G RATIO SPE: 0.9 (ref 0.7–1.7)
ALBUMIN ELP: 3.2 g/dL (ref 2.9–4.4)
ALPHA-2-GLOBULIN: 0.4 g/dL (ref 0.4–1.0)
Alpha-1-Globulin: 0.2 g/dL (ref 0.0–0.4)
BETA GLOBULIN: 1 g/dL (ref 0.7–1.3)
GLOBULIN, TOTAL: 3.5 g/dL (ref 2.2–3.9)
Gamma Globulin: 1.9 g/dL — ABNORMAL HIGH (ref 0.4–1.8)
Total Protein ELP: 6.7 g/dL (ref 6.0–8.5)

## 2015-01-31 LAB — LIPID PANEL
CHOL/HDL RATIO: 3.9 ratio
Cholesterol: 132 mg/dL (ref 0–200)
HDL: 34 mg/dL — AB (ref >40)
LDL CALC: 80 mg/dL (ref 0–99)
Triglycerides: 90 mg/dL (ref <150)
VLDL: 18 mg/dL (ref 0–40)

## 2015-01-31 LAB — URIC ACID: Uric Acid, Serum: 8 mg/dL — ABNORMAL HIGH (ref 4.4–7.6)

## 2015-01-31 LAB — GLUCOSE, CAPILLARY: GLUCOSE-CAPILLARY: 100 mg/dL — AB (ref 65–99)

## 2015-01-31 LAB — TSH: TSH: 1.177 u[IU]/mL (ref 0.350–4.500)

## 2015-01-31 MED ORDER — AMLODIPINE BESYLATE 10 MG PO TABS: 10.0000 mg | ORAL_TABLET | Freq: Every day | ORAL | Status: DC

## 2015-01-31 MED ORDER — HYDRALAZINE HCL 50 MG PO TABS
100.0000 mg | ORAL_TABLET | Freq: Three times a day (TID) | ORAL | Status: DC
  Administered 2015-01-31 – 2015-02-01 (×3): 100 mg via ORAL
  Filled 2015-01-31 (×3): qty 2

## 2015-01-31 MED ORDER — FUROSEMIDE 80 MG PO TABS
80.0000 mg | ORAL_TABLET | Freq: Two times a day (BID) | ORAL | Status: DC
  Administered 2015-01-31 – 2015-02-01 (×2): 80 mg via ORAL
  Filled 2015-01-31 (×2): qty 1

## 2015-01-31 MED ORDER — POTASSIUM CHLORIDE CRYS ER 20 MEQ PO TBCR
40.0000 meq | EXTENDED_RELEASE_TABLET | Freq: Once | ORAL | Status: AC
  Administered 2015-01-31: 40 meq via ORAL
  Filled 2015-01-31: qty 2

## 2015-01-31 MED ORDER — CARVEDILOL 25 MG PO TABS
50.0000 mg | ORAL_TABLET | Freq: Two times a day (BID) | ORAL | Status: DC
  Administered 2015-01-31 – 2015-02-01 (×2): 50 mg via ORAL
  Filled 2015-01-31 (×2): qty 2

## 2015-01-31 MED ORDER — FUROSEMIDE 80 MG PO TABS: 80.0000 mg | ORAL_TABLET | Freq: Two times a day (BID) | ORAL | Status: DC

## 2015-01-31 MED ORDER — FLUTICASONE PROPIONATE 50 MCG/ACT NA SUSP: 1.0000 | Freq: Every day | NASAL | Status: DC | PRN

## 2015-01-31 MED ORDER — CARVEDILOL 25 MG PO TABS: 50.0000 mg | ORAL_TABLET | Freq: Two times a day (BID) | ORAL | Status: DC

## 2015-01-31 MED ORDER — HYDRALAZINE HCL 100 MG PO TABS: 100.0000 mg | ORAL_TABLET | Freq: Three times a day (TID) | ORAL | Status: DC

## 2015-01-31 MED ORDER — ASPIRIN 81 MG PO TBEC: 81.0000 mg | DELAYED_RELEASE_TABLET | Freq: Every day | ORAL | Status: DC

## 2015-01-31 MED ORDER — POTASSIUM CHLORIDE CRYS ER 20 MEQ PO TBCR: 20.0000 meq | EXTENDED_RELEASE_TABLET | Freq: Two times a day (BID) | ORAL | Status: DC

## 2015-01-31 NOTE — Discharge Summary (Signed)
Physician Discharge Summary   Patient ID: Arion Bufkin MRN: [id number] DOB/AGE: [birth year] 54 y.o.  Admit date: 01/29/2015 Discharge date: 01/31/2015  Primary Care Physician:  No primary care provider on file.  Discharge Diagnoses:    . Hypertensive emergency/malignant hypertension  . Acute kidney injury ([place]) with likely chronic kidney disease  . Anemia . sinusitis  . Abnormal EKG . Hypokalemia  Consults:  Cardiology, Dr. Percival Spanish Nephrology, Dr. Jimmy Footman   Recommendations for Outpatient Follow-up:  1. Patient will need antihypertensives titration 2. Please repeat BMET at next visit   DIET: Heart healthy diet    Allergies:   Allergies  Allergen Reactions  . Lisinopril Cough     DISCHARGE MEDICATIONS: Current Discharge Medication List    START taking these medications   Details  amLODipine (NORVASC) 10 MG tablet Take 1 tablet (10 mg total) by mouth at bedtime. Qty: 30 tablet, Refills: 5    aspirin EC 81 MG EC tablet Take 1 tablet (81 mg total) by mouth daily. Qty: 30 tablet, Refills: 5    carvedilol (COREG) 25 MG tablet Take 2 tablets (50 mg total) by mouth 2 (two) times daily with a meal. Qty: 60 tablet, Refills: 5    fluticasone (FLONASE) 50 MCG/ACT nasal spray Place 1 spray into both nostrils daily as needed for allergies or rhinitis. Qty: 16 g, Refills: 2    furosemide (LASIX) 80 MG tablet Take 1 tablet (80 mg total) by mouth 2 (two) times daily. Qty: 60 tablet, Refills: 5    hydrALAZINE (APRESOLINE) 100 MG tablet Take 1 tablet (100 mg total) by mouth every 8 (eight) hours. Qty: 90 tablet, Refills: 5    potassium chloride SA (K-DUR,KLOR-CON) 20 MEQ tablet Take 1 tablet (20 mEq total) by mouth 2 (two) times daily. Qty: 60 tablet, Refills: 5         Brief H and P: For complete details please refer to admission H and P, but in brief Patient is a 55 year old male with a past medical history significant for hypertension; presented from urgent care  with complaints of elevated creatinine. 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 He reported that he had been out of his medications of amlodipine since August 2016. He had been tried on lisinopril, but it caused him to have a chronic cough and this was discontinued. Previously told that he had a bump in his creatinine before, but never returned to PCP regarding this issue. At the doctor's office when he went today his blood pressure was 215/109. He denied any headache, blurred vision, change in urine output, chest pain, or palpitations. In ED, BP was 230/154 and creatinine was elevated at 2.42. He was given 20 mg of IV labetalol in the ED. Patient was admitted for further workup  Hospital Course:   Hypertensive emergency with end organ damage patient presented with BP of 230/154 in ED - Patient was started on amlodipine 10mg , hydralazine 100mg  TID, Coreg 50mg  BID. Cardiology and nephrology were consulted. Nephrology, Dr. Jimmy Footman recommended starting patient on Lasix 80 mg twice a day. - 2-D echo showed EF 99991111, grade 2 diastolic dysfunction - Currently asymptomatic, no headache, blurry vision chest pain or shortness of breath - UDS negative - Renal artery duplex showed 1-59% stenosis bilaterally   Abnormal EKG - EKG showed LVH with repolarization changes, T-wave inversions in lateral leads, inferior leads -  cardiology was consulted -Serial  troponins remain negative, 2-D echo showed EF 99991111, grade 2 diastolic dysfunction   Acute kidney injury ([facility]) - Patient has likely chronic kidney disease, renal ultrasound showed no hydronephrosis, consistent with chronic medical renal disease - No renal artery stenosis on the duplex.   Anemia - Likely anemia of chronic renal disease,  - Anemia panel consistent with anemia of chronic disease, SPEP,UPEP In process    Acute upper respiratory  infection/sinusitis - Continue Flonase   Hypokalemia - replaced   Day of Discharge BP 164/96 mmHg  Pulse 63  Temp(Src) 97.4 F (36.3 C) (Oral)  Resp 18  Ht 6\' 2"  (1.88 m)  Wt 94.484 kg (208 lb 4.8 oz)  BMI 26.73 kg/m2  SpO2 98%  Physical Exam: General: Alert and awake oriented x3 not in any acute distress. HEENT: anicteric sclera, pupils reactive to light and accommodation CVS: S1-S2 clear no murmur rubs or gallops Chest: clear to auscultation bilaterally, no wheezing rales or rhonchi Abdomen: soft nontender, nondistended, normal bowel sounds Extremities: no cyanosis, clubbing or edema noted bilaterally Neuro: Cranial nerves II-XII intact, no focal neurological deficits   The results of significant diagnostics from this hospitalization (including imaging, microbiology, ancillary and laboratory) are listed below for reference.    LAB RESULTS: Basic Metabolic Panel:  Recent Labs Lab 01/30/15 0250 01/30/15 0708  01/30/15 1440 01/31/15 0300  NA 141  --   --   --  140  K 2.9*  --   < > 3.4* 3.1*  CL 101  --   --   --  100*  CO2 33*  --   --   --  30  GLUCOSE 93  --   --   --  139*  BUN 28*  --   --   --  29*  CREATININE 2.45*  --   --   --  2.54*  CALCIUM 8.9  --   --   --  8.9  MG  --  2.1  --   --   --   < > = values in this interval not displayed. Liver Function Tests:  Recent Labs Lab 01/29/15 1522  AST 13*  ALT 10*  ALKPHOS 49  BILITOT 0.4  PROT 7.7  ALBUMIN 3.5   No results for input(s): LIPASE, AMYLASE in the last 168 hours. No results for input(s): AMMONIA in the last 168 hours. CBC:  Recent Labs Lab 01/29/15 1522 01/30/15 0250  WBC 5.0 4.8  NEUTROABS 2.6 2.1  HGB 11.7* 11.3*  HCT 35.4* 34.2*  MCV 82.5 82.8  PLT 304 253   Cardiac Enzymes:  Recent Labs Lab 01/30/15 1706 01/31/15 0019  TROPONINI 0.04* 0.03   BNP: Invalid input(s): POCBNP CBG:  Recent Labs Lab 01/31/15 0645  GLUCAP 100*    Significant Diagnostic Studies:   US Renal  01/30/2015  CLINICAL DATA:  55 year old male with acute renal insufficiency. EXAM: RENAL / URINARY TRACT ULTRASOUND COMPLETE COMPARISON:  None. FINDINGS: Right Kidney: Length: 10 cm. The right kidney appears mildly echogenic. There is no hydronephrosis or echogenic stone. Left Kidney: Length: 10 cm. There is moderate increased echogenicity of the left renal parenchyma. No hydronephrosis or echogenic stone. Bladder: The urinary bladder is only partially distended and appears grossly unremarkable. The prostate gland is mildly enlarged and measures 5 cm in transverse dimension. IMPRESSION: Mild increased renal echogenicity may represent underlying medical renal disease. No hydronephrosis. Electronically Signed   By: Anner Crete M.D.   On: 01/30/2015 03:22  2D ECHO: Study Conclusions  - Left ventricle: The cavity size was normal. Wall thickness was increased in a pattern of severe LVH. Systolic function was normal. The estimated ejection fraction was in the range of 50% to 55%. Features are consistent with a pseudonormal left ventricular filling pattern, with concomitant abnormal relaxation and increased filling pressure (grade 2 diastolic dysfunction). - Left atrium: The atrium was moderately dilated.  Disposition and Follow-up:    DISPOSITION: Home   DISCHARGE FOLLOW-UP Follow-up Information    Follow up with [facility] On 02/26/2015.   Why:  F/U appointment made for 9:15 (new pt establish care)   Contact information:   [place] [phone]       Follow up with DETERDING,JAMES L, MD. Schedule an appointment as soon as possible for a visit in 2 weeks.   Specialty:  Nephrology   Why:  for hospital follow-up   Contact information:   [place] [address] [phone]        Time spent on Discharge: 40 minutes  Signed:   RAI,RIPUDEEP M.D. [facility] 01/31/2015, 3:07  PM Pager: [phone]

## 2015-01-31 NOTE — Consult Note (Signed)
Reason for Consult:CKD, Hypertensive Urgency Referring Physician: Dr. Mauricio Po is an 55 y.o. [name].  HPI: 55 yr [name] with at least 8 yr known HTN.  Stopped his meds 8/16.  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  Sys in mid 200s and dia in mid 100s.  No HA, visual sx, SOB, BP but ankle edema.  Cough with lisinopril, and hx gout.  Mother with HTN and had CABG and CVA.  Healthy brother, and daughter.  Father with dementia in 91s.  No NSAIDS, or decongestants. No FH of renal dz, inherited ms skel, eye or hearing abn.  He denies stones or hematuria. Constitutional: negative Eyes: negative Ears, nose, mouth, throat, and face: congestion Respiratory: negative, quit smoking 18 yr ago Cardiovascular: negative Gastrointestinal: negative Genitourinary:negative Integument/breast: negative Hematologic/lymphatic: anemic Musculoskeletal:op in past on R great toe, Hx gout R ankle Neurological: negative Endocrine: negative Allergic/Immunologic: cough with Lisinopril   Past Medical History  Diagnosis Date  . Hypertension   . AKI (acute kidney injury) ([place]) 01/2015    Past Surgical History  Procedure Laterality Date  . Toe surgery    . Tonsillectomy      Family History  Problem Relation Age of Onset  . Heart attack Mother 44    CABG hx  . Heart disease Mother   . Hypertension Mother   . Healthy Father   . Healthy Brother     Social History:  reports that he has quit smoking. He has never used smokeless tobacco. He reports that he does not drink alcohol or use illicit drugs.  Allergies:  Allergies  Allergen Reactions  . Lisinopril Cough    Medications:  I have reviewed the patient's current medications. Prior to Admission:  No prescriptions prior to admission    Results for orders placed or performed during the hospital encounter of 01/29/15 (from the past 48 hour(s))  CBC with Differential     Status: Abnormal   Collection Time: 01/29/15  3:22 PM  Result Value Ref  Range   WBC 5.0 4.0 - 10.5 K/uL   RBC 4.29 4.22 - 5.81 MIL/uL   Hemoglobin 11.7 (L) 13.0 - 17.0 g/dL   HCT 35.4 (L) 39.0 - 52.0 %   MCV 82.5 78.0 - 100.0 fL   MCH 27.3 26.0 - 34.0 pg   MCHC 33.1 30.0 - 36.0 g/dL   RDW 12.7 11.5 - 15.5 %   Platelets 304 150 - 400 K/uL   Neutrophils Relative % 51 %   Neutro Abs 2.6 1.7 - 7.7 K/uL   Lymphocytes Relative 26 %   Lymphs Abs 1.3 0.7 - 4.0 K/uL   Monocytes Relative 10 %   Monocytes Absolute 0.5 0.1 - 1.0 K/uL   Eosinophils Relative 12 %   Eosinophils Absolute 0.6 0.0 - 0.7 K/uL   Basophils Relative 1 %   Basophils Absolute 0.0 0.0 - 0.1 K/uL  Comprehensive metabolic panel     Status: Abnormal   Collection Time: 01/29/15  3:22 PM  Result Value Ref Range   Sodium 139 135 - 145 mmol/L   Potassium 3.5 3.5 - 5.1 mmol/L   Chloride 99 (L) 101 - 111 mmol/L   CO2 29 22 - 32 mmol/L   Glucose, Bld 96 65 - 99 mg/dL   BUN 28 (H) 6 - 20 mg/dL   Creatinine, Ser 2.42 (H) 0.61 - 1.24 mg/dL   Calcium 9.1 8.9 - 10.3 mg/dL   Total Protein 7.7 6.5 -  8.1 g/dL   Albumin 3.5 3.5 - 5.0 g/dL   AST 13 (L) 15 - 41 U/L   ALT 10 (L) 17 - 63 U/L   Alkaline Phosphatase 49 38 - 126 U/L   Total Bilirubin 0.4 0.3 - 1.2 mg/dL   GFR calc non Af Amer 29 (L) >60 mL/min   GFR calc Af Amer 33 (L) >60 mL/min    Comment: (NOTE) The eGFR has been calculated using the CKD EPI equation. This calculation has not been validated in all clinical situations. eGFR's persistently <60 mL/min signify possible Chronic Kidney Disease.    Anion gap 11 5 - 15  Urinalysis, Routine w reflex microscopic (not at [facility])     Status: Abnormal   Collection Time: 01/29/15  6:02 PM  Result Value Ref Range   Color, Urine YELLOW YELLOW   APPearance CLEAR CLEAR   Specific Gravity, Urine 1.014 1.005 - 1.030   pH 6.0 5.0 - 8.0   Glucose, UA NEGATIVE NEGATIVE mg/dL   Hgb urine dipstick SMALL (A) NEGATIVE   Bilirubin Urine NEGATIVE NEGATIVE   Ketones, ur NEGATIVE NEGATIVE mg/dL   Protein, ur  100 (A) NEGATIVE mg/dL   Nitrite NEGATIVE NEGATIVE   Leukocytes, UA NEGATIVE NEGATIVE  Urine microscopic-add on     Status: Abnormal   Collection Time: 01/29/15  6:02 PM  Result Value Ref Range   Squamous Epithelial / LPF 0-5 (A) NONE SEEN   WBC, UA 0-5 0 - 5 WBC/hpf   RBC / HPF 6-30 0 - 5 RBC/hpf   Bacteria, UA RARE (A) NONE SEEN  I-Stat Troponin, ED (not at [facility])     Status: None   Collection Time: 01/29/15  6:09 PM  Result Value Ref Range   Troponin i, poc 0.02 0.00 - 0.08 ng/mL   Comment 3            Comment: Due to the release kinetics of cTnI, a negative result within the first hours of the onset of symptoms does not rule out myocardial infarction with certainty. If myocardial infarction is still suspected, repeat the test at appropriate intervals.   Basic metabolic panel     Status: Abnormal   Collection Time: 01/30/15  2:50 AM  Result Value Ref Range   Sodium 141 135 - 145 mmol/L   Potassium 2.9 (L) 3.5 - 5.1 mmol/L    Comment: DELTA CHECK NOTED   Chloride 101 101 - 111 mmol/L   CO2 33 (H) 22 - 32 mmol/L   Glucose, Bld 93 65 - 99 mg/dL   BUN 28 (H) 6 - 20 mg/dL   Creatinine, Ser 2.45 (H) 0.61 - 1.24 mg/dL   Calcium 8.9 8.9 - 10.3 mg/dL   GFR calc non Af Amer 28 (L) >60 mL/min   GFR calc Af Amer 33 (L) >60 mL/min    Comment: (NOTE) The eGFR has been calculated using the CKD EPI equation. This calculation has not been validated in all clinical situations. eGFR's persistently <60 mL/min signify possible Chronic Kidney Disease.    Anion gap 7 5 - 15  CBC with Differential/Platelet     Status: Abnormal   Collection Time: 01/30/15  2:50 AM  Result Value Ref Range   WBC 4.8 4.0 - 10.5 K/uL   RBC 4.13 (L) 4.22 - 5.81 MIL/uL   Hemoglobin 11.3 (L) 13.0 - 17.0 g/dL   HCT 34.2 (L) 39.0 - 52.0 %   MCV 82.8 78.0 - 100.0 fL   MCH 27.4 26.0 -  34.0 pg   MCHC 33.0 30.0 - 36.0 g/dL   RDW 12.7 11.5 - 15.5 %   Platelets 253 150 - 400 K/uL   Neutrophils Relative % 43 %    Neutro Abs 2.1 1.7 - 7.7 K/uL   Lymphocytes Relative 29 %   Lymphs Abs 1.4 0.7 - 4.0 K/uL   Monocytes Relative 14 %   Monocytes Absolute 0.7 0.1 - 1.0 K/uL   Eosinophils Relative 14 %   Eosinophils Absolute 0.7 0.0 - 0.7 K/uL   Basophils Relative 0 %   Basophils Absolute 0.0 0.0 - 0.1 K/uL  Iron and TIBC     Status: Abnormal   Collection Time: 01/30/15  2:50 AM  Result Value Ref Range   Iron 41 (L) 45 - 182 ug/dL   TIBC 183 (L) 250 - 450 ug/dL   Saturation Ratios 22 17.9 - 39.5 %   UIBC 142 ug/dL  Magnesium     Status: None   Collection Time: 01/30/15  7:08 AM  Result Value Ref Range   Magnesium 2.1 1.7 - 2.4 mg/dL  Urine rapid drug screen (hosp performed)     Status: None   Collection Time: 01/30/15  8:46 AM  Result Value Ref Range   Opiates NONE DETECTED NONE DETECTED   Cocaine NONE DETECTED NONE DETECTED   Benzodiazepines NONE DETECTED NONE DETECTED   Amphetamines NONE DETECTED NONE DETECTED   Tetrahydrocannabinol NONE DETECTED NONE DETECTED   Barbiturates NONE DETECTED NONE DETECTED    Comment:        DRUG SCREEN FOR MEDICAL PURPOSES ONLY.  IF CONFIRMATION IS NEEDED FOR ANY PURPOSE, NOTIFY LAB WITHIN 5 DAYS.        LOWEST DETECTABLE LIMITS FOR URINE DRUG SCREEN Drug Class       Cutoff (ng/mL) Amphetamine      1000 Barbiturate      200 Benzodiazepine   374 Tricyclics       827 Opiates          300 Cocaine          300 THC              50   Potassium     Status: Abnormal   Collection Time: 01/30/15 12:01 PM  Result Value Ref Range   Potassium >7.5 (HH) 3.5 - 5.1 mmol/L    Comment: REPEATED TO VERIFY NO VISIBLE HEMOLYSIS QUESTIONABLE RESULTS, RECOMMEND RECOLLECT TO VERIFY CRITICAL RESULT CALLED TO, READ BACK BY AND VERIFIED WITH: ANGELICA DAVIS,RN AT 0786 01/30/15 BY ZBEECH.   Troponin I     Status: Abnormal   Collection Time: 01/30/15 12:01 PM  Result Value Ref Range   Troponin I 0.05 (H) <0.031 ng/mL    Comment:        PERSISTENTLY INCREASED  TROPONIN VALUES IN THE RANGE OF 0.04-0.49 ng/mL CAN BE SEEN IN:       -UNSTABLE ANGINA       -CONGESTIVE HEART FAILURE       -MYOCARDITIS       -CHEST TRAUMA       -ARRYHTHMIAS       -LATE PRESENTING MYOCARDIAL INFARCTION       -COPD   CLINICAL FOLLOW-UP RECOMMENDED.   Folate     Status: None   Collection Time: 01/30/15 12:01 PM  Result Value Ref Range   Folate 11.2 >5.9 ng/mL  Reticulocytes     Status: None   Collection Time: 01/30/15 12:01 PM  Result Value Ref Range  Retic Ct Pct 1.9 0.4 - 3.1 %   RBC. 4.31 4.22 - 5.81 MIL/uL   Retic Count, Manual 81.9 19.0 - 186.0 K/uL  Vitamin B12     Status: None   Collection Time: 01/30/15 12:01 PM  Result Value Ref Range   Vitamin B-12 196 180 - 914 pg/mL    Comment: (NOTE) This assay is not validated for testing neonatal or myeloproliferative syndrome specimens for Vitamin B12 levels.   Iron and TIBC     Status: Abnormal   Collection Time: 01/30/15 12:01 PM  Result Value Ref Range   Iron 42 (L) 45 - 182 ug/dL   TIBC 181 (L) 250 - 450 ug/dL   Saturation Ratios 23 17.9 - 39.5 %   UIBC 139 ug/dL  Ferritin     Status: Abnormal   Collection Time: 01/30/15 12:01 PM  Result Value Ref Range   Ferritin 348 (H) 24 - 336 ng/mL  Potassium     Status: Abnormal   Collection Time: 01/30/15  2:40 PM  Result Value Ref Range   Potassium 3.4 (L) 3.5 - 5.1 mmol/L    Comment: RESULTS VERIFIED VIA RECOLLECT  Troponin I     Status: Abnormal   Collection Time: 01/30/15  5:06 PM  Result Value Ref Range   Troponin I 0.04 (H) <0.031 ng/mL    Comment:        PERSISTENTLY INCREASED TROPONIN VALUES IN THE RANGE OF 0.04-0.49 ng/mL CAN BE SEEN IN:       -UNSTABLE ANGINA       -CONGESTIVE HEART FAILURE       -MYOCARDITIS       -CHEST TRAUMA       -ARRYHTHMIAS       -LATE PRESENTING MYOCARDIAL INFARCTION       -COPD   CLINICAL FOLLOW-UP RECOMMENDED.   Troponin I     Status: None   Collection Time: 01/31/15 12:19 AM  Result Value Ref Range    Troponin I 0.03 <0.031 ng/mL    Comment:        NO INDICATION OF MYOCARDIAL INJURY.   Basic metabolic panel     Status: Abnormal   Collection Time: 01/31/15  3:00 AM  Result Value Ref Range   Sodium 140 135 - 145 mmol/L   Potassium 3.1 (L) 3.5 - 5.1 mmol/L   Chloride 100 (L) 101 - 111 mmol/L   CO2 30 22 - 32 mmol/L   Glucose, Bld 139 (H) 65 - 99 mg/dL   BUN 29 (H) 6 - 20 mg/dL   Creatinine, Ser 2.54 (H) 0.61 - 1.24 mg/dL   Calcium 8.9 8.9 - 10.3 mg/dL   GFR calc non Af Amer 27 (L) >60 mL/min   GFR calc Af Amer 32 (L) >60 mL/min    Comment: (NOTE) The eGFR has been calculated using the CKD EPI equation. This calculation has not been validated in all clinical situations. eGFR's persistently <60 mL/min signify possible Chronic Kidney Disease.    Anion gap 10 5 - 15  Lipid panel     Status: Abnormal   Collection Time: 01/31/15  3:00 AM  Result Value Ref Range   Cholesterol 132 0 - 200 mg/dL   Triglycerides 90 <150 mg/dL   HDL 34 (L) >40 mg/dL   Total CHOL/HDL Ratio 3.9 RATIO   VLDL 18 0 - 40 mg/dL   LDL Cholesterol 80 0 - 99 mg/dL    Comment:        Total Cholesterol/HDL:CHD  Risk Coronary Heart Disease Risk Table                     Men   Women  1/2 Average Risk   3.4   3.3  Average Risk       5.0   4.4  2 X Average Risk   9.6   7.1  3 X Average Risk  23.4   11.0        Use the calculated Patient Ratio above and the CHD Risk Table to determine the patient's CHD Risk.        ATP III CLASSIFICATION (LDL):  <100     mg/dL   Optimal  100-129  mg/dL   Near or Above                    Optimal  130-159  mg/dL   Borderline  160-189  mg/dL   High  >190     mg/dL   Very High   Glucose, capillary     Status: Abnormal   Collection Time: 01/31/15  6:45 AM  Result Value Ref Range   Glucose-Capillary 100 (H) 65 - 99 mg/dL  TSH     Status: None   Collection Time: 01/31/15  8:57 AM  Result Value Ref Range   TSH 1.177 0.350 - 4.500 uIU/mL    US Renal  01/30/2015  CLINICAL  DATA:  55 year old [name] with acute renal insufficiency. EXAM: RENAL / URINARY TRACT ULTRASOUND COMPLETE COMPARISON:  None. FINDINGS: Right Kidney: Length: 10 cm. The right kidney appears mildly echogenic. There is no hydronephrosis or echogenic stone. Left Kidney: Length: 10 cm. There is moderate increased echogenicity of the left renal parenchyma. No hydronephrosis or echogenic stone. Bladder: The urinary bladder is only partially distended and appears grossly unremarkable. The prostate gland is mildly enlarged and measures 5 cm in transverse dimension. IMPRESSION: Mild increased renal echogenicity may represent underlying medical renal disease. No hydronephrosis. Electronically Signed   By: Anner Crete M.D.   On: 01/30/2015 03:22    ROS Blood pressure 164/96, pulse 63, temperature 97.4 F (36.3 C), temperature source Oral, resp. rate 18, height 6' 2"  (1.88 m), weight 94.484 kg (208 lb 4.8 oz), SpO2 98 %. Physical Exam Physical Examination: General appearance - alert, well appearing, and in no distress Mental status - alert, oriented to person, place, and time Eyes - art narrowing silver wiring, AV nicking Mouth - mucous membranes moist, pharynx normal without lesions Neck - adenopathy noted PCL Lymphatics - posterior cervical nodes Chest - clear to auscultation, no wheezes, rales or rhonchi, symmetric air entry Heart - S1 and S2 normal, S4 present, systolic murmur SX1/1 at apex LV lift Abdomen - soft, pos bs, liver down 3 cm Musculoskeletal - no joint tenderness, deformity or swelling Extremities - pedal edema 1 +, intact peripheral pulses Skin - dry   Assessment/Plan: 1 CKD 3-4 most likely HTN and scarring in kidneys. Need to eval protein in urine later.  Need to look at secondary complic.  Will get worse before better 2 NONADHERENCE needs close f/u to avoid complic 3 Hypertension: need to use more effective agents , add diuretic. 4. Anemia  eval 5. Metabolic Bone Disease: check  PTH 6 Gout check UA P carvedilol, Lasix, stop metop, PTH, diet , check Fe, Uric acid, follow, counsel  Frederik Standley L 01/31/2015, 11:29 AM

## 2015-01-31 NOTE — Progress Notes (Signed)
Attempted to get patient for renal artery duplex. Pt forgot to be fasting and had breakfast at 9:30am.  Pt has to be NPO for 6 hours for test. Informed Lauren, RN and will get patient down at 3:00pm for test.   Landry Mellow, RDMS, RVT 01/31/2015 9:52 AM

## 2015-01-31 NOTE — Progress Notes (Signed)
*  PRELIMINARY RESULTS* Vascular Ultrasound Renal Artery Duplex has been completed.   Findings suggest 1-59% renal artery stenosis bilaterally.   01/31/2015 2:26 PM Maudry Mayhew, RVT, RDCS, RDMS

## 2015-01-31 NOTE — Progress Notes (Signed)
SUBJECTIVE:  No pain.  No SOB   PHYSICAL EXAM Filed Vitals:   01/30/15 2211 01/31/15 0216 01/31/15 0352 01/31/15 0513  BP: 172/101 177/105 168/100 164/96  Pulse: 63  63   Temp: 97.9 F (36.6 C)  97.4 F (36.3 C)   TempSrc: Oral  Oral   Resp: 20  18   Height:      Weight:      SpO2: 100%  98%    General:  No distress Lungs:  Clear Heart:  RRR Abdomen:  Positive bowel sounds, no rebound no guarding Extremities:  No edema   LABS: Lab Results  Component Value Date   TROPONINI 0.03 01/31/2015   Results for orders placed or performed during the hospital encounter of 01/29/15 (from the past 24 hour(s))  Urine rapid drug screen (hosp performed)     Status: None   Collection Time: 01/30/15  8:46 AM  Result Value Ref Range   Opiates NONE DETECTED NONE DETECTED   Cocaine NONE DETECTED NONE DETECTED   Benzodiazepines NONE DETECTED NONE DETECTED   Amphetamines NONE DETECTED NONE DETECTED   Tetrahydrocannabinol NONE DETECTED NONE DETECTED   Barbiturates NONE DETECTED NONE DETECTED  Potassium     Status: Abnormal   Collection Time: 01/30/15 12:01 PM  Result Value Ref Range   Potassium >7.5 (HH) 3.5 - 5.1 mmol/L  Troponin I     Status: Abnormal   Collection Time: 01/30/15 12:01 PM  Result Value Ref Range   Troponin I 0.05 (H) <0.031 ng/mL  Folate     Status: None   Collection Time: 01/30/15 12:01 PM  Result Value Ref Range   Folate 11.2 >5.9 ng/mL  Reticulocytes     Status: None   Collection Time: 01/30/15 12:01 PM  Result Value Ref Range   Retic Ct Pct 1.9 0.4 - 3.1 %   RBC. 4.31 4.22 - 5.81 MIL/uL   Retic Count, Manual 81.9 19.0 - 186.0 K/uL  Vitamin B12     Status: None   Collection Time: 01/30/15 12:01 PM  Result Value Ref Range   Vitamin B-12 196 180 - 914 pg/mL  Iron and TIBC     Status: Abnormal   Collection Time: 01/30/15 12:01 PM  Result Value Ref Range   Iron 42 (L) 45 - 182 ug/dL   TIBC 181 (L) 250 - 450 ug/dL   Saturation Ratios 23 17.9 - 39.5 %   UIBC 139 ug/dL  Ferritin     Status: Abnormal   Collection Time: 01/30/15 12:01 PM  Result Value Ref Range   Ferritin 348 (H) 24 - 336 ng/mL  Potassium     Status: Abnormal   Collection Time: 01/30/15  2:40 PM  Result Value Ref Range   Potassium 3.4 (L) 3.5 - 5.1 mmol/L  Troponin I     Status: Abnormal   Collection Time: 01/30/15  5:06 PM  Result Value Ref Range   Troponin I 0.04 (H) <0.031 ng/mL  Troponin I     Status: None   Collection Time: 01/31/15 12:19 AM  Result Value Ref Range   Troponin I 0.03 <0.031 ng/mL  Basic metabolic panel     Status: Abnormal   Collection Time: 01/31/15  3:00 AM  Result Value Ref Range   Sodium 140 135 - 145 mmol/L   Potassium 3.1 (L) 3.5 - 5.1 mmol/L   Chloride 100 (L) 101 - 111 mmol/L   CO2 30 22 - 32 mmol/L   Glucose, Bld 139 (  H) 65 - 99 mg/dL   BUN 29 (H) 6 - 20 mg/dL   Creatinine, Ser 2.54 (H) 0.61 - 1.24 mg/dL   Calcium 8.9 8.9 - 10.3 mg/dL   GFR calc non Af Amer 27 (L) >60 mL/min   GFR calc Af Amer 32 (L) >60 mL/min   Anion gap 10 5 - 15  Lipid panel     Status: Abnormal   Collection Time: 01/31/15  3:00 AM  Result Value Ref Range   Cholesterol 132 0 - 200 mg/dL   Triglycerides 90 <150 mg/dL   HDL 34 (L) >40 mg/dL   Total CHOL/HDL Ratio 3.9 RATIO   VLDL 18 0 - 40 mg/dL   LDL Cholesterol 80 0 - 99 mg/dL  Glucose, capillary     Status: Abnormal   Collection Time: 01/31/15  6:45 AM  Result Value Ref Range   Glucose-Capillary 100 (H) 65 - 99 mg/dL    Intake/Output Summary (Last 24 hours) at 01/31/15 0800 Last data filed at 01/30/15 1700  Gross per 24 hour  Intake    723 ml  Output    800 ml  Net    -77 ml     ASSESSMENT AND PLAN:  ABNORMAL EKG:  Echo pending.  Likely secondary to HTN.  Avoid any QT prolonging drugs.    HTN:  BP is improved but not perfect.  He will need a home BP cuff and twice daily BPs to keep a diary.   Can change to Toprol XL 200 mg daily.  Other meds as listed.  He will need further med titration as  an outpatient.     HYPOKALEMIA:  Supplement per primary team.    Minus Breeding 01/31/2015 8:00 AM

## 2015-01-31 NOTE — Progress Notes (Signed)
Echocardiogram 2D Echocardiogram has been performed.  Joelene Millin 01/31/2015, 1:16 PM

## 2015-01-31 NOTE — Progress Notes (Signed)
Have set up at our office for BP and chem in 1 wk and f/u in 3 wk.

## 2015-02-01 DIAGNOSIS — N179 Acute kidney failure, unspecified: Secondary | ICD-10-CM | POA: Insufficient documentation

## 2015-02-01 LAB — PARATHYROID HORMONE, INTACT (NO CA): PTH: 155 pg/mL — AB (ref 15–65)

## 2015-02-01 MED ORDER — ALLOPURINOL 100 MG PO TABS
100.0000 mg | ORAL_TABLET | Freq: Every day | ORAL | Status: DC
  Administered 2015-02-01: 100 mg via ORAL
  Filled 2015-02-01: qty 1

## 2015-02-01 MED ORDER — CALCITRIOL 0.25 MCG PO CAPS
0.2500 ug | ORAL_CAPSULE | Freq: Every day | ORAL | Status: DC
  Administered 2015-02-01: 0.25 ug via ORAL
  Filled 2015-02-01: qty 1

## 2015-02-01 MED ORDER — CALCITRIOL 0.25 MCG PO CAPS: 0.2500 ug | ORAL_CAPSULE | Freq: Every day | ORAL | Status: DC

## 2015-02-01 MED ORDER — ALLOPURINOL 100 MG PO TABS: 100.0000 mg | ORAL_TABLET | Freq: Every day | ORAL | Status: DC

## 2015-02-01 NOTE — Progress Notes (Signed)
Subjective: Interval History: has no complaint , ready to go.  Objective: Vital signs in last 24 hours: Temp:  [98.5 F (36.9 C)] 98.5 F (36.9 C) (01/21 0400) Pulse Rate:  [71-79] 71 (01/21 0400) Resp:  [18] 18 (01/21 0400) BP: (152-212)/(93-132) 158/93 mmHg (01/21 0400) SpO2:  [96 %-100 %] 96 % (01/21 0400) Weight change:   Intake/Output from previous day: 01/20 0701 - 01/21 0700 In: 360 [P.O.:360] Out: 850 [Urine:850] Intake/Output this shift:    General appearance: alert, cooperative and no distress Resp: clear to auscultation bilaterally Cardio: S1, S2 normal and systolic murmur: holosystolic 2/6, blowing at apex GI: soft, non-tender; bowel sounds normal; no masses,  no organomegaly Extremities: extremities normal, atraumatic, no cyanosis or edema  Lab Results:  Recent Labs  01/29/15 1522 01/30/15 0250  WBC 5.0 4.8  HGB 11.7* 11.3*  HCT 35.4* 34.2*  PLT 304 253   BMET:  Recent Labs  01/30/15 0250  01/30/15 1440 01/31/15 0300  NA 141  --   --  140  K 2.9*  < > 3.4* 3.1*  CL 101  --   --  100*  CO2 33*  --   --  30  GLUCOSE 93  --   --  139*  BUN 28*  --   --  29*  CREATININE 2.45*  --   --  2.54*  CALCIUM 8.9  --   --  8.9  < > = values in this interval not displayed.  Recent Labs  01/31/15 1247  PTH 155*   Iron Studies:  Recent Labs  01/30/15 1201 01/31/15 1247  IRON 42* 98  TIBC 181* 217*  FERRITIN 348*  --     Studies/Results: No results found.  I have reviewed the patient's current medications.  Assessment/Plan: 1 CKD 3 from HTN ,Cr will worsen and get better with bp control. Will follow 2 Anemia related to renal dz 3 HPTH add vit D 4 HTN improving f/u P will s/o for now and f/u in our office, arranged      Rayon Mcchristian L 02/01/2015,8:07 AM

## 2015-02-01 NOTE — Discharge Summary (Signed)
Physician Discharge Summary   Patient ID: Jason [name] MRN: [id number] DOB/AGE: April 27, 1960 55 y.o.  Admit date: 01/29/2015 Discharge date: 02/01/2015  Primary Care Physician:  No primary care provider on file.  Discharge Diagnoses:    . Hypertensive emergency/malignant hypertension  . Acute kidney injury ([place]) with likely chronic kidney disease  . Anemia . sinusitis  . Abnormal EKG . Hypokalemia  Consults:  Cardiology, Dr. Percival Spanish Nephrology, Dr. Jimmy Footman   Recommendations for Outpatient Follow-up:  1. Patient will need antihypertensives titration 2. Please repeat BMET at next visit   DIET: Heart healthy diet    Allergies:   Allergies  Allergen Reactions  . Lisinopril Cough     DISCHARGE MEDICATIONS: Current Discharge Medication List    START taking these medications   Details  allopurinol (ZYLOPRIM) 100 MG tablet Take 1 tablet (100 mg total) by mouth daily. Qty: 30 tablet, Refills: 3    amLODipine (NORVASC) 10 MG tablet Take 1 tablet (10 mg total) by mouth at bedtime. Qty: 30 tablet, Refills: 5    aspirin EC 81 MG EC tablet Take 1 tablet (81 mg total) by mouth daily. Qty: 30 tablet, Refills: 5    calcitRIOL (ROCALTROL) 0.25 MCG capsule Take 1 capsule (0.25 mcg total) by mouth daily. Qty: 30 capsule, Refills: 3    carvedilol (COREG) 25 MG tablet Take 2 tablets (50 mg total) by mouth 2 (two) times daily with a meal. Qty: 120 tablet, Refills: 5    fluticasone (FLONASE) 50 MCG/ACT nasal spray Place 1 spray into both nostrils daily as needed for allergies or rhinitis. Qty: 16 g, Refills: 2    furosemide (LASIX) 80 MG tablet Take 1 tablet (80 mg total) by mouth 2 (two) times daily. Qty: 60 tablet, Refills: 5    hydrALAZINE (APRESOLINE) 100 MG tablet Take 1 tablet (100 mg total) by mouth every 8 (eight) hours. Qty: 90 tablet, Refills: 5    potassium chloride SA (K-DUR,KLOR-CON) 20 MEQ tablet Take 1 tablet (20 mEq total) by mouth 2 (two) times daily. Qty:  60 tablet, Refills: 5         Brief H and P: For complete details please refer to admission H and P, but in brief Patient is a 55 year old male with a past medical history significant for hypertension; presented from urgent care with complaints of elevated creatinine. 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 He reported that he had been out of his medications of amlodipine since August 2016. He had been tried on lisinopril, but it caused him to have a chronic cough and this was discontinued. Previously told that he had a bump in his creatinine before, but never returned to PCP regarding this issue. At the doctor's office when he went today his blood pressure was 215/109. He denied any headache, blurred vision, change in urine output, chest pain, or palpitations. In ED, BP was 230/154 and creatinine was elevated at 2.42. He was given 20 mg of IV labetalol in the ED. Patient was admitted for further workup  Hospital Course:   Hypertensive emergency with end organ damage patient presented with BP of 230/154 in ED - Patient was started on amlodipine 10mg , hydralazine 100mg  TID, Coreg 50mg  BID. Cardiology and nephrology were consulted. Nephrology, Dr. Jimmy Footman recommended starting patient on Lasix 80 mg twice a day. - 2-D echo showed EF 99991111, grade 2 diastolic dysfunction - Currently asymptomatic, no  headache, blurry vision chest pain or shortness of breath - UDS negative - Renal artery duplex showed 1-59% stenosis bilaterally   Abnormal EKG - EKG showed LVH with repolarization changes, T-wave inversions in lateral leads, inferior leads -  cardiology was consulted -Serial troponins remain negative, 2-D echo showed EF 99991111, grade 2 diastolic dysfunction   Acute kidney injury ([place]) - Patient has likely chronic kidney disease, renal ultrasound showed no hydronephrosis, consistent with chronic  medical renal disease - No renal artery stenosis on the duplex.   Anemia - Likely anemia of chronic renal disease,  - Anemia panel consistent with anemia of chronic disease, SPEP,UPEP In process    Acute upper respiratory infection/sinusitis - Continue Flonase   Hypokalemia - replaced   Day of Discharge BP 158/93 mmHg  Pulse 71  Temp(Src) 98.5 F (36.9 C) (Oral)  Resp 18  Ht 6\' 2"  (1.88 m)  Wt 94.484 kg (208 lb 4.8 oz)  BMI 26.73 kg/m2  SpO2 96%  Physical Exam: General: Alert and awake oriented x3 not in any acute distress. HEENT: anicteric sclera, pupils reactive to light and accommodation CVS: S1-S2 clear no murmur rubs or gallops Chest: clear to auscultation bilaterally, no wheezing rales or rhonchi Abdomen: soft nontender, nondistended, normal bowel sounds Extremities: no cyanosis, clubbing or edema noted bilaterally Neuro: Cranial nerves II-XII intact, no focal neurological deficits   The results of significant diagnostics from this hospitalization (including imaging, microbiology, ancillary and laboratory) are listed below for reference.    LAB RESULTS: Basic Metabolic Panel:  Recent Labs Lab 01/30/15 0250 01/30/15 0708  01/30/15 1440 01/31/15 0300  NA 141  --   --   --  140  K 2.9*  --   < > 3.4* 3.1*  CL 101  --   --   --  100*  CO2 33*  --   --   --  30  GLUCOSE 93  --   --   --  139*  BUN 28*  --   --   --  29*  CREATININE 2.45*  --   --   --  2.54*  CALCIUM 8.9  --   --   --  8.9  MG  --  2.1  --   --   --   < > = values in this interval not displayed. Liver Function Tests:  Recent Labs Lab 01/29/15 1522  AST 13*  ALT 10*  ALKPHOS 49  BILITOT 0.4  PROT 7.7  ALBUMIN 3.5   No results for input(s): LIPASE, AMYLASE in the last 168 hours. No results for input(s): AMMONIA in the last 168 hours. CBC:  Recent Labs Lab 01/29/15 1522 01/30/15 0250  WBC 5.0 4.8  NEUTROABS 2.6 2.1  HGB 11.7* 11.3*  HCT 35.4* 34.2*  MCV 82.5 82.8  PLT  304 253   Cardiac Enzymes:  Recent Labs Lab 01/30/15 1706 01/31/15 0019  TROPONINI 0.04* 0.03   BNP: Invalid input(s): POCBNP CBG:  Recent Labs Lab 01/31/15 0645  GLUCAP 100*    Significant Diagnostic Studies:  US Renal  01/30/2015  CLINICAL DATA:  55 year old male with acute renal insufficiency. EXAM: RENAL / URINARY TRACT ULTRASOUND COMPLETE COMPARISON:  None. FINDINGS: Right Kidney: Length: 10 cm. The right kidney appears mildly echogenic. There is no hydronephrosis or echogenic stone. Left Kidney: Length: 10 cm. There is moderate increased echogenicity of the left renal parenchyma. No hydronephrosis or echogenic stone. Bladder: The urinary bladder is only partially distended and appears grossly  unremarkable. The prostate gland is mildly enlarged and measures 5 cm in transverse dimension. IMPRESSION: Mild increased renal echogenicity may represent underlying medical renal disease. No hydronephrosis. Electronically Signed   By: Anner Crete M.D.   On: 01/30/2015 03:22    2D ECHO: Study Conclusions  - Left ventricle: The cavity size was normal. Wall thickness was increased in a pattern of severe LVH. Systolic function was normal. The estimated ejection fraction was in the range of 50% to 55%. Features are consistent with a pseudonormal left ventricular filling pattern, with concomitant abnormal relaxation and increased filling pressure (grade 2 diastolic dysfunction). - Left atrium: The atrium was moderately dilated.  Disposition and Follow-up: Discharge Instructions    Diet - low sodium heart healthy    Complete by:  As directed      Increase activity slowly    Complete by:  As directed             DISPOSITION: Home   DISCHARGE FOLLOW-UP Follow-up Information    Follow up with [facility] On 02/26/2015.   Why:  F/U appointment made for 9:15 (new pt establish care)   Contact information:   [place] [phone]       Follow up with DETERDING,JAMES L, MD. Schedule an appointment as soon as possible for a visit in 2 weeks.   Specialty:  Nephrology   Why:  for hospital follow-up   Contact information:   [place] [address] [phone]        Time spent on Discharge: 40 minutes  Signed:   Kentrell Hallahan M.D. [facility] 02/01/2015, 8:20 AM Pager: [phone]

## 2015-02-01 NOTE — Progress Notes (Signed)
[facility]                                                                              Patient Demographics  Jason [name], is a 55 y.o. male, DOB - 27-Sep-1960, [id number]  Admit date - 01/29/2015   Admitting Physician Norval Morton, MD  Outpatient Primary MD for the patient is No primary care provider on file.  LOS -    Chief Complaint  Patient presents with  . Hypertension  . Abnormal Lab       Brief HPI   Patient is a 55 year old male with a past medical history significant for hypertension; presented from urgent care with complaints of elevated creatinine. 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 He reported that he had been out of his medications of amlodipine since August 2016. He had been tried on lisinopril, but it caused him to have a chronic cough and this was discontinued. Previously told that he had a bump in his creatinine before, but never returned to PCP regarding this issue. At the doctor's office when he went today his blood pressure was 215/109. He denied any headache, blurred vision, change in urine output, chest pain, or palpitations. In ED, BP was 230/154 and creatinine was elevated at 2.42. He was given 20 mg of IV labetalol in the ED. Patient was admitted for further workup   Assessment & Plan    Principal Problem:   Hypertensive emergency with end organ damage Patient was started on amlodipine 10mg , hydralazine 100mg  TID, Coreg 50mg  BID. Cardiology and nephrology were consulted. Nephrology, Dr. Jimmy Footman recommended starting patient on Lasix 80 mg twice a day. - 2-D echo showed EF 99991111, grade 2 diastolic dysfunction - Currently asymptomatic, no headache, blurry vision chest pain or shortness of breath - UDS negative - Renal artery duplex showed 1-59% stenosis bilaterally  Active Problems:   Abnormal EKG EKG showed LVH with  repolarization changes, T-wave inversions in lateral leads, inferior leads - cardiology was consulted -Serial troponins remain negative, 2-D echo showed EF 99991111, grade 2 diastolic dysfunction    Acute kidney injury ([place]) - Patient has likely chronic kidney disease, renal ultrasound showed no hydronephrosis, consistent with chronic medical renal disease - No renal artery stenosis on the duplex.    Anemia - Likely anemia of chronic renal disease    Acute upper respiratory infection/sinusitis - Continue Flonase, Claritin    Hypokalemia - replaced   Code Status: Full CODE STATUS  Family Communication: Discussed in detail with the patient, all imaging results, lab results explained to the patient    Disposition Plan: DC summary done yesterday, no changes. BP improving. Stable to be discharged.      Time Spent in minutes  14mins   Procedures  Renal ultrasound  Consults   Cardiology  DVT Prophylaxis  heparin   Medications  Scheduled Meds: . amLODipine  10 mg Oral QHS  . aspirin EC  81 mg Oral Daily  . carvedilol  50 mg Oral BID WC  . fluticasone  1  spray Each Nare Daily  . furosemide  80 mg Oral BID  . guaiFENesin  600 mg Oral BID  . heparin  5,000 Units Subcutaneous 3 times per day  . hydrALAZINE  100 mg Oral 3 times per day  . loratadine  10 mg Oral Daily  . sodium chloride  3 mL Intravenous Q12H   Continuous Infusions:  PRN Meds:.acetaminophen **OR** acetaminophen, hydrALAZINE, labetalol, ondansetron **OR** ondansetron (ZOFRAN) IV   Antibiotics   Anti-infectives    None        Subjective:   Jason [name] was seen and examined today.BP improving.  Patient denies dizziness, chest pain, shortness of breath, abdominal pain, N/V/D/C, new weakness, numbess, tingling. No acute events overnight.    Objective:   Blood pressure 158/93, pulse 71, temperature 98.5 F (36.9 C), temperature source Oral, resp. rate 18, height 6\' 2"  (1.88 m), weight 94.484 kg (208 lb  4.8 oz), SpO2 96 %.  Wt Readings from Last 3 Encounters:  01/29/15 94.484 kg (208 lb 4.8 oz)     Intake/Output Summary (Last 24 hours) at 02/01/15 0706 Last data filed at 01/31/15 1630  Gross per 24 hour  Intake    360 ml  Output    850 ml  Net   -490 ml    Exam  General: Alert and oriented x 3, NAD  HEENT:  PERRLA, EOMI  Neck: Supple, no JVD, no masses  CVS: S1 S2 clear, RRR  Respiratory: CTAB  Abdomen: Soft, NT, ND, NBS  Ext: no cyanosis clubbing or edema  Neuro: no new deficits  Skin: No rashes  Psych: Normal affect and demeanor, alert and oriented x3    Data Review   Micro Results No results found for this or any previous visit (from the past 240 hour(s)).  Radiology Reports US Renal  01/30/2015  CLINICAL DATA:  55 year old male with acute renal insufficiency. EXAM: RENAL / URINARY TRACT ULTRASOUND COMPLETE COMPARISON:  None. FINDINGS: Right Kidney: Length: 10 cm. The right kidney appears mildly echogenic. There is no hydronephrosis or echogenic stone. Left Kidney: Length: 10 cm. There is moderate increased echogenicity of the left renal parenchyma. No hydronephrosis or echogenic stone. Bladder: The urinary bladder is only partially distended and appears grossly unremarkable. The prostate gland is mildly enlarged and measures 5 cm in transverse dimension. IMPRESSION: Mild increased renal echogenicity may represent underlying medical renal disease. No hydronephrosis. Electronically Signed   By: Anner Crete M.D.   On: 01/30/2015 03:22    CBC  Recent Labs Lab 01/29/15 1522 01/30/15 0250  WBC 5.0 4.8  HGB 11.7* 11.3*  HCT 35.4* 34.2*  PLT 304 253  MCV 82.5 82.8  MCH 27.3 27.4  MCHC 33.1 33.0  RDW 12.7 12.7  LYMPHSABS 1.3 1.4  MONOABS 0.5 0.7  EOSABS 0.6 0.7  BASOSABS 0.0 0.0    Chemistries   Recent Labs Lab 01/29/15 1522 01/30/15 0250 01/30/15 0708 01/30/15 1201 01/30/15 1440 01/31/15 0300  NA 139 141  --   --   --  140  K 3.5 2.9*   --  >7.5* 3.4* 3.1*  CL 99* 101  --   --   --  100*  CO2 29 33*  --   --   --  30  GLUCOSE 96 93  --   --   --  139*  BUN 28* 28*  --   --   --  29*  CREATININE 2.42* 2.45*  --   --   --  2.54*  CALCIUM 9.1 8.9  --   --   --  8.9  MG  --   --  2.1  --   --   --   AST 13*  --   --   --   --   --   ALT 10*  --   --   --   --   --   ALKPHOS 49  --   --   --   --   --   BILITOT 0.4  --   --   --   --   --    ------------------------------------------------------------------------------------------------------------------ estimated creatinine clearance is 39.1 mL/min (by C-G formula based on Cr of 2.54). ------------------------------------------------------------------------------------------------------------------ No results for input(s): HGBA1C in the last 72 hours. ------------------------------------------------------------------------------------------------------------------  Recent Labs  01/31/15 0300  CHOL 132  HDL 34*  LDLCALC 80  TRIG 90  CHOLHDL 3.9   ------------------------------------------------------------------------------------------------------------------  Recent Labs  01/31/15 0857  TSH 1.177   ------------------------------------------------------------------------------------------------------------------  Recent Labs  01/30/15 1201 01/31/15 1247  VITAMINB12 196  --   FOLATE 11.2  --   FERRITIN 348*  --   TIBC 181* 217*  IRON 42* 98  RETICCTPCT 1.9  --     Coagulation profile No results for input(s): INR, PROTIME in the last 168 hours.  No results for input(s): DDIMER in the last 72 hours.  Cardiac Enzymes  Recent Labs Lab 01/30/15 1201 01/30/15 1706 01/31/15 0019  TROPONINI 0.05* 0.04* 0.03   ------------------------------------------------------------------------------------------------------------------ Invalid input(s): POCBNP   Recent Labs  01/31/15 0645  GLUCAP 100*     Vanessa Kampf M.D. [facility] 02/01/2015,  7:06 AM  Pager: [phone] Between 7am to 7pm - call Pager - [phone]  After 7pm go to [email or web address] - password [email or web address]  Call night coverage person covering after 7pm

## 2015-02-01 NOTE — Progress Notes (Signed)
Patient has refused heparin and labs during this shift, stating he expects to be discharged this morning and therefore will forego heparin and morning labs.

## 2015-02-04 LAB — ALDOSTERONE + RENIN ACTIVITY W/ RATIO
ALDO / PRA Ratio: 34.9 — ABNORMAL HIGH (ref 0.0–30.0)
Aldosterone: 13.6 ng/dL (ref 0.0–30.0)
PRA LC/MS/MS: 0.39 ng/mL/hr

## 2015-02-07 MED FILL — ?CARVEDILOL 25 MG TABLET: 25 | 30 days supply | Qty: 120 | Fill #0

## 2015-02-07 MED FILL — hydrALAZINE HCL 100 MG TABS: 100 | 30 days supply | Qty: 90 | Fill #0

## 2015-02-07 MED FILL — ?FUROSEMIDE 80MG TABLET: 80 | 30 days supply | Qty: 60 | Fill #0

## 2015-02-07 MED FILL — POTASSIUM CL ER 20 MEQ TAB: 20 | 30 days supply | Qty: 60 | Fill #0

## 2015-02-20 MED FILL — LABETALOL HCL 300 MG TABLET: 300 | 30 days supply | Qty: 60 | Fill #0

## 2015-02-26 ENCOUNTER — Encounter: Payer: Self-pay | Admitting: Family Medicine

## 2015-02-26 ENCOUNTER — Ambulatory Visit (INDEPENDENT_AMBULATORY_CARE_PROVIDER_SITE_OTHER): Payer: Self-pay | Admitting: Family Medicine

## 2015-02-26 VITALS — BP 134/77 | HR 66 | Temp 98.2°F | Resp 14 | Ht 74.0 in | Wt 210.0 lb

## 2015-02-26 DIAGNOSIS — N183 Chronic kidney disease, stage 3 unspecified: Secondary | ICD-10-CM

## 2015-02-26 DIAGNOSIS — I10 Essential (primary) hypertension: Secondary | ICD-10-CM | POA: Insufficient documentation

## 2015-02-26 DIAGNOSIS — K4090 Unilateral inguinal hernia, without obstruction or gangrene, not specified as recurrent: Secondary | ICD-10-CM

## 2015-02-26 LAB — POCT URINALYSIS DIP (DEVICE)
Bilirubin Urine: NEGATIVE
Glucose, UA: NEGATIVE mg/dL
Hgb urine dipstick: NEGATIVE
Ketones, ur: NEGATIVE mg/dL
Leukocytes, UA: NEGATIVE
Nitrite: NEGATIVE
Protein, ur: NEGATIVE mg/dL
Specific Gravity, Urine: 1.015 (ref 1.005–1.030)
Urobilinogen, UA: 0.2 mg/dL (ref 0.0–1.0)
pH: 7 (ref 5.0–8.0)

## 2015-02-26 NOTE — Progress Notes (Signed)
Subjective:    Patient ID: Jason [name], male    DOB: 1960-10-28, 55 y.o.   MRN: [id number]  HPI Jason [name], a 55 year old male with a history of hypertension presents for a post hospital follow up and to establish care. He states that he was a patient of [facility], but has not followed consistently due to lack of payer source. Patient had also been out of his hypertension medications for several months. He was told previously that his creatinine level was elevated but he did not return to PCP for follow up. He states that he was attempting to donate plasma several weeks ago and was found to have a blood pressure of 220/118. He states that he was sent to the emergency room. Patient was admitted and found to stage 3 chronic kidney disease. He states that he has established care with Dr. Jimmy Footman at [facility]. He maintains that he is currently taking medications consistently. He denies headache, dizziness, palpations, chest pains, dysuria, nausea, vomiting, or diarrhea.    Past Medical History  Diagnosis Date  . Hypertension   . AKI (acute kidney injury) ([place]) 01/2015   Past Surgical History  Procedure Laterality Date  . Toe surgery    . Tonsillectomy     Allergies  Allergen Reactions  . Lisinopril Cough   Social History   Social History  . Marital Status: Single    Spouse Name: N/A  . Number of Children: N/A  . Years of Education: N/A   Occupational History  . Not on file.   Social History Main Topics  . Smoking status: Former Research scientist (life sciences)  . Smokeless tobacco: Never Used     Comment: quit smoking in 1997  . Alcohol Use: No  . Drug Use: No  . Sexual Activity: Not on file   Other Topics Concern  . Not on file   Social History Narrative   There is no immunization history on file for this patient. Review of Systems  Constitutional: Negative for fever and fatigue.  HENT: Negative.   Eyes: Negative.   Respiratory: Negative for chest tightness,  shortness of breath and wheezing.   Cardiovascular: Negative.   Gastrointestinal: Negative.        Patient complaining of inguinal hernia  Endocrine: Negative for polydipsia, polyphagia and polyuria.  Genitourinary: Negative for dysuria and hematuria.  Musculoskeletal: Negative.   Skin: Negative.   Neurological: Negative.   Hematological: Negative.   Psychiatric/Behavioral: Negative.        Objective:   Physical Exam  Constitutional: He is oriented to person, place, and time. He appears well-developed and well-nourished.  HENT:  Head: Normocephalic and atraumatic.  Right Ear: External ear normal.  Left Ear: External ear normal.  Nose: Nose normal.  Mouth/Throat: Oropharynx is clear and moist.  Eyes: Conjunctivae and EOM are normal. Pupils are equal, round, and reactive to light.  Neck: Normal range of motion. Neck supple.  Cardiovascular: Normal rate, regular rhythm, normal heart sounds and intact distal pulses.   Pulmonary/Chest: Effort normal.  Abdominal: Soft. Bowel sounds are normal. A hernia is present. Hernia confirmed positive in the right inguinal area (reducible).  Musculoskeletal: Normal range of motion.  Neurological: He is alert and oriented to person, place, and time. He has normal reflexes.  Skin: Skin is warm and dry.  Psychiatric: He has a normal mood and affect. His behavior is normal. Judgment and thought content normal.      BP 134/77 mmHg  Pulse  66  Temp(Src) 98.2 F (36.8 C) (Oral)  Resp 14  Ht 6\' 2"  (1.88 m)  Wt 210 lb (95.255 kg)  BMI 26.95 kg/m2 Assessment & Plan:   1. Essential hypertension Blood pressure is at goal on current medication regimen. Will continue medications at current dosage. No proteinuria present on urinalysis - POCT urinalysis dipstick - POCT urinalysis dip (device) - labetalol (NORMODYNE) 300 MG tablet; Take 300 mg by mouth 2 (two) times daily. - furosemide (LASIX) 80 MG tablet; Take 80 mg by mouth.  2. Chronic kidney  disease, stage 3 (moderate) Previous GFR was 32 and creatinine level 2.54. Patient has established care with Dr. Jimmy Footman, he is to return as scheduled. Will review notes as they become available  3. Right inguinal hernia Right inguinal hernia is non tender to palpation and reducible. Will continue to monitor.    Routine Health Maintenance:  Last eye examination: years ago Prostate check- greater than 1 year (grandfather) Refuse immunizations Non smoker No alcohol use Not sexually active     RTC: Will follow up in 2 months for hypertension and digital rectal examination  The patient was given clear instructions to go to ER or return to medical center if symptoms do not improve, worsen or new problems develop. The patient verbalized understanding. Will notify patient with laboratory results. Dorena Dew, FNP

## 2015-02-26 NOTE — Patient Instructions (Signed)
DASH Eating Plan DASH stands for "Dietary Approaches to Stop Hypertension." The DASH eating plan is a healthy eating plan that has been shown to reduce high blood pressure (hypertension). Additional health benefits may include reducing the risk of type 2 diabetes mellitus, heart disease, and stroke. The DASH eating plan may also help with weight loss. WHAT DO I NEED TO KNOW ABOUT THE DASH EATING PLAN? For the DASH eating plan, you will follow these general guidelines:  Choose foods with a percent daily value for sodium of less than 5% (as listed on the food label).  Use salt-free seasonings or herbs instead of table salt or sea salt.  Check with your health care provider or pharmacist before using salt substitutes.  Eat lower-sodium products, often labeled as "lower sodium" or "no salt added."  Eat fresh foods.  Eat more vegetables, fruits, and low-fat dairy products.  Choose whole grains. Look for the word "whole" as the first word in the ingredient list.  Choose fish and skinless chicken or turkey more often than red meat. Limit fish, poultry, and meat to 6 oz (170 g) each day.  Limit sweets, desserts, sugars, and sugary drinks.  Choose heart-healthy fats.  Limit cheese to 1 oz (28 g) per day.  Eat more home-cooked food and less restaurant, buffet, and fast food.  Limit fried foods.  Cook foods using methods other than frying.  Limit canned vegetables. If you do use them, rinse them well to decrease the sodium.  When eating at a restaurant, ask that your food be prepared with less salt, or no salt if possible. WHAT FOODS CAN I EAT? Seek help from a dietitian for individual calorie needs. Grains Whole grain or whole wheat bread. Brown rice. Whole grain or whole wheat pasta. Quinoa, bulgur, and whole grain cereals. Low-sodium cereals. Corn or whole wheat flour tortillas. Whole grain cornbread. Whole grain crackers. Low-sodium crackers. Vegetables Fresh or frozen vegetables  (raw, steamed, roasted, or grilled). Low-sodium or reduced-sodium tomato and vegetable juices. Low-sodium or reduced-sodium tomato sauce and paste. Low-sodium or reduced-sodium canned vegetables.  Fruits All fresh, canned (in natural juice), or frozen fruits. Meat and Other Protein Products Ground beef (85% or leaner), grass-fed beef, or beef trimmed of fat. Skinless chicken or turkey. Ground chicken or turkey. Pork trimmed of fat. All fish and seafood. Eggs. Dried beans, peas, or lentils. Unsalted nuts and seeds. Unsalted canned beans. Dairy Low-fat dairy products, such as skim or 1% milk, 2% or reduced-fat cheeses, low-fat ricotta or cottage cheese, or plain low-fat yogurt. Low-sodium or reduced-sodium cheeses. Fats and Oils Tub margarines without trans fats. Light or reduced-fat mayonnaise and salad dressings (reduced sodium). Avocado. Safflower, olive, or canola oils. Natural peanut or almond butter. Other Unsalted popcorn and pretzels. The items listed above may not be a complete list of recommended foods or beverages. Contact your dietitian for more options. WHAT FOODS ARE NOT RECOMMENDED? Grains White bread. White pasta. White rice. Refined cornbread. Bagels and croissants. Crackers that contain trans fat. Vegetables Creamed or fried vegetables. Vegetables in a cheese sauce. Regular canned vegetables. Regular canned tomato sauce and paste. Regular tomato and vegetable juices. Fruits Dried fruits. Canned fruit in light or heavy syrup. Fruit juice. Meat and Other Protein Products Fatty cuts of meat. Ribs, chicken wings, bacon, sausage, bologna, salami, chitterlings, fatback, hot dogs, bratwurst, and packaged luncheon meats. Salted nuts and seeds. Canned beans with salt. Dairy Whole or 2% milk, cream, half-and-half, and cream cheese. Whole-fat or sweetened yogurt. Full-fat   cheeses or blue cheese. Nondairy creamers and whipped toppings. Processed cheese, cheese spreads, or cheese  curds. Condiments Onion and garlic salt, seasoned salt, table salt, and sea salt. Canned and packaged gravies. Worcestershire sauce. Tartar sauce. Barbecue sauce. Teriyaki sauce. Soy sauce, including reduced sodium. Steak sauce. Fish sauce. Oyster sauce. Cocktail sauce. Horseradish. Ketchup and mustard. Meat flavorings and tenderizers. Bouillon cubes. Hot sauce. Tabasco sauce. Marinades. Taco seasonings. Relishes. Fats and Oils Butter, stick margarine, lard, shortening, ghee, and bacon fat. Coconut, palm kernel, or palm oils. Regular salad dressings. Other Pickles and olives. Salted popcorn and pretzels. The items listed above may not be a complete list of foods and beverages to avoid. Contact your dietitian for more information. WHERE CAN I FIND MORE INFORMATION? National Heart, Lung, and Blood Institute: travelstabloid.com   This information is not intended to replace advice given to you by your health care provider. Make sure you discuss any questions you have with your health care provider.   Document Released: 12/17/2010 Document Revised: 01/18/2014 Document Reviewed: 11/01/2012 Elsevier Interactive Patient Education 2016 Elsevier Inc. Inguinal Hernia, Adult Muscles help keep everything in the body in its proper place. But if a weak spot in the muscles develops, something can poke through. That is called a hernia. When this happens in the lower part of the belly (abdomen), it is called an inguinal hernia. (It takes its name from a part of the body in this region called the inguinal canal.) A weak spot in the wall of muscles lets some fat or part of the small intestine bulge through. An inguinal hernia can develop at any age. Men get them more often than women. CAUSES  In adults, an inguinal hernia develops over time.  It can be triggered by:  Suddenly straining the muscles of the lower abdomen.  Lifting heavy objects.  Straining to have a bowel  movement. Difficult bowel movements (constipation) can lead to this.  Constant coughing. This may be caused by smoking or lung disease.  Being overweight.  Being pregnant.  Working at a job that requires long periods of standing or heavy lifting.  Having had an inguinal hernia before. One type can be an emergency situation. It is called a strangulated inguinal hernia. It develops if part of the small intestine slips through the weak spot and cannot get back into the abdomen. The blood supply can be cut off. If that happens, part of the intestine may die. This situation requires emergency surgery. SYMPTOMS  Often, a small inguinal hernia has no symptoms. It is found when a healthcare provider does a physical exam. Larger hernias usually have symptoms.   In adults, symptoms may include:  A lump in the groin. This is easier to see when the person is standing. It might disappear when lying down.  In men, a lump in the scrotum.  Pain or burning in the groin. This occurs especially when lifting, straining or coughing.  A dull ache or feeling of pressure in the groin.  Signs of a strangulated hernia can include:  A bulge in the groin that becomes very painful and tender to the touch.  A bulge that turns red or purple.  Fever, nausea and vomiting.  Inability to have a bowel movement or to pass gas. DIAGNOSIS  To decide if you have an inguinal hernia, a healthcare provider will probably do a physical examination.  This will include asking questions about any symptoms you have noticed.  The healthcare provider might feel the groin area  and ask you to cough. If an inguinal hernia is felt, the healthcare provider may try to slide it back into the abdomen.  Usually no other tests are needed. TREATMENT  Treatments can vary. The size of the hernia makes a difference. Options include:  Watchful waiting. This is often suggested if the hernia is small and you have had no symptoms.  No  medical procedure will be done unless symptoms develop.  You will need to watch closely for symptoms. If any occur, contact your healthcare provider right away.  Surgery. This is used if the hernia is larger or you have symptoms.  Open surgery. This is usually an outpatient procedure (you will not stay overnight in a hospital). An cut (incision) is made through the skin in the groin. The hernia is put back inside the abdomen. The weak area in the muscles is then repaired by herniorrhaphy or hernioplasty. Herniorrhaphy: in this type of surgery, the weak muscles are sewn back together. Hernioplasty: a patch or mesh is used to close the weak area in the abdominal wall.  Laparoscopy. In this procedure, a surgeon makes small incisions. A thin tube with a tiny video camera (called a laparoscope) is put into the abdomen. The surgeon repairs the hernia with mesh by looking with the video camera and using two long instruments. HOME CARE INSTRUCTIONS   After surgery to repair an inguinal hernia:  You will need to take pain medicine prescribed by your healthcare provider. Follow all directions carefully.  You will need to take care of the wound from the incision.  Your activity will be restricted for awhile. This will probably include no heavy lifting for several weeks. You also should not do anything too active for a few weeks. When you can return to work will depend on the type of job that you have.  During "watchful waiting" periods, you should:  Maintain a healthy weight.  Eat a diet high in fiber (fruits, vegetables and whole grains).  Drink plenty of fluids to avoid constipation. This means drinking enough water and other liquids to keep your urine clear or pale yellow.  Do not lift heavy objects.  Do not stand for long periods of time.  Quit smoking. This should keep you from developing a frequent cough. SEEK MEDICAL CARE IF:   A bulge develops in your groin area.  You feel pain, a  burning sensation or pressure in the groin. This might be worse if you are lifting or straining.  You develop a fever of more than 100.5 F (38.1 C). SEEK IMMEDIATE MEDICAL CARE IF:   Pain in the groin increases suddenly.  A bulge in the groin gets bigger suddenly and does not go down.  For men, there is sudden pain in the scrotum. Or, the size of the scrotum increases.  A bulge in the groin area becomes red or purple and is painful to touch.  You have nausea or vomiting that does not go away.  You feel your heart beating much faster than normal.  You cannot have a bowel movement or pass gas.  You develop a fever of more than 102.0 F (38.9 C).   This information is not intended to replace advice given to you by your health care provider. Make sure you discuss any questions you have with your health care provider.   Document Released: 05/16/2008 Document Revised: 03/22/2011 Document Reviewed: 07/01/2014 Elsevier Interactive Patient Education [employer].

## 2015-03-03 MED FILL — ?FUROSEMIDE 80MG TABLET: 80 | 30 days supply | Qty: 60 | Fill #1

## 2015-03-06 MED FILL — AMLODIPINE BESYLATE 10 MG T: 10 | 30 days supply | Qty: 30 | Fill #0

## 2015-03-07 MED FILL — CALCITRIOL 0.25 MCG CAPSULE: 0.25 | 31 days supply | Qty: 31 | Fill #0

## 2015-03-20 MED FILL — hydrALAZINE HCL 100 MG TABS: 100 | 30 days supply | Qty: 90 | Fill #1

## 2015-03-20 MED FILL — ?FUROSEMIDE 80MG TABLET: 80 | 30 days supply | Qty: 60 | Fill #2

## 2015-03-20 MED FILL — POTASSIUM CL ER 20 MEQ TAB: 20 | 30 days supply | Qty: 60 | Fill #1

## 2015-04-07 MED FILL — CALCITRIOL 0.25 MCG CAPSULE: 0.25 | 31 days supply | Qty: 31 | Fill #1

## 2015-04-07 MED FILL — AMLODIPINE BESYLATE 10 MG T: 10 | 30 days supply | Qty: 30 | Fill #1

## 2015-04-28 MED FILL — FUROSEMIDE 80 MG TABLET: 80 | 30 days supply | Qty: 60 | Fill #3

## 2015-05-12 ENCOUNTER — Ambulatory Visit: Payer: Self-pay | Admitting: Family Medicine

## 2015-05-12 MED FILL — AMLODIPINE BESYLATE 10 MG T: 10 | 30 days supply | Qty: 30 | Fill #2

## 2015-05-12 MED FILL — CALCITRIOL 0.25 MCG CAPSULE: 0.25 | 31 days supply | Qty: 31 | Fill #2

## 2015-05-26 MED FILL — FUROSEMIDE 80 MG TABLET: 80 | 30 days supply | Qty: 60 | Fill #4

## 2015-05-26 MED FILL — POTASSIUM CL ER 20 MEQ TAB: 20 | 30 days supply | Qty: 60 | Fill #2

## 2015-05-26 MED FILL — BYSTOLIC 20 MG TABLET: 20 | 30 days supply | Qty: 30 | Fill #0

## 2015-06-10 MED FILL — CALCITRIOL 0.25 MCG CAPSULE: 0.25 | 31 days supply | Qty: 31 | Fill #3

## 2015-06-10 MED FILL — hydrALAZINE HCL 100 MG TABS: 100 | 30 days supply | Qty: 90 | Fill #2

## 2015-06-13 MED FILL — AMLODIPINE BESYLATE 10 MG T: 10 | 30 days supply | Qty: 30 | Fill #3

## 2015-06-16 MED FILL — FUROSEMIDE 80 MG TABLET: 80 | 30 days supply | Qty: 60 | Fill #5

## 2015-06-27 MED FILL — POTASSIUM CL ER 20 MEQ TAB: 20 | 30 days supply | Qty: 60 | Fill #3

## 2015-07-07 MED FILL — BYSTOLIC 20 MG TABLET: 20 | 30 days supply | Qty: 30 | Fill #1

## 2015-07-09 MED FILL — CALCITRIOL 0.25 MCG CAPSULE: 0.25 | 31 days supply | Qty: 31 | Fill #4

## 2015-07-29 MED FILL — AMLODIPINE BESYLATE 10 MG T: 10 | 30 days supply | Qty: 30 | Fill #4

## 2015-08-01 IMAGING — US US RENAL
1 series · 14 of 25 positions shown · non-contrast
Comparison: None.

CLINICAL DATA: 54-year-old male with acute renal insufficiency.

EXAM:
RENAL / URINARY TRACT ULTRASOUND COMPLETE

[Series 1: us renal · 0.23mm/px · 14 of 25 slices shown]
[im 1/25]
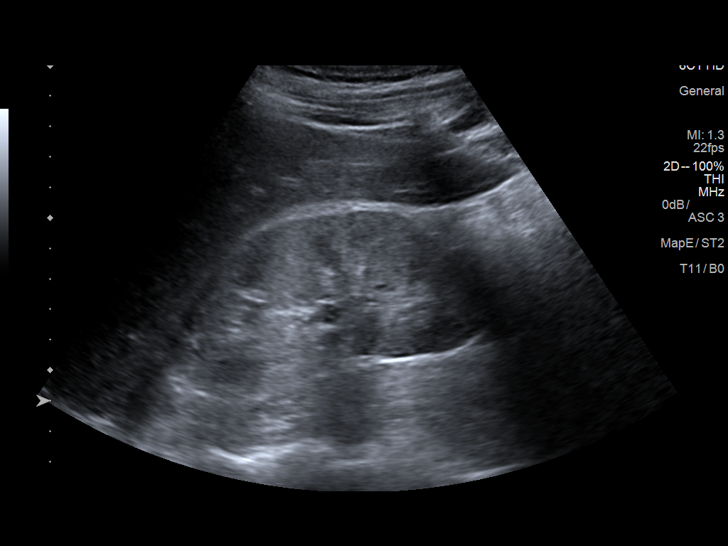
[im 3/25]
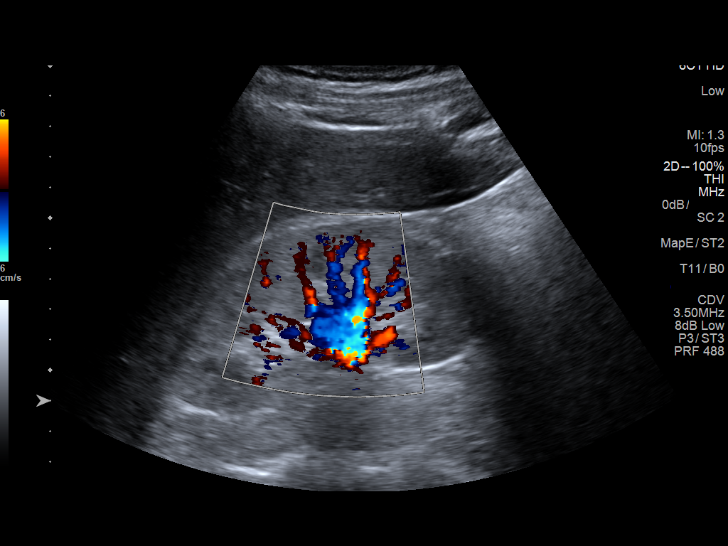
[im 5/25]
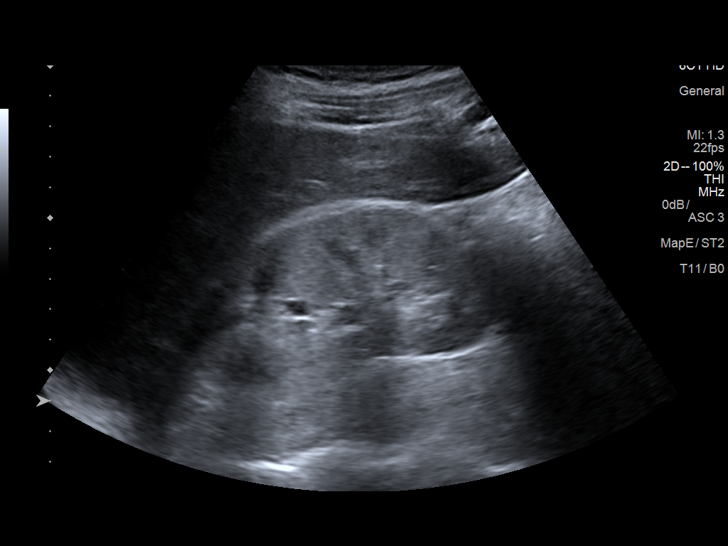
[im 7/25]
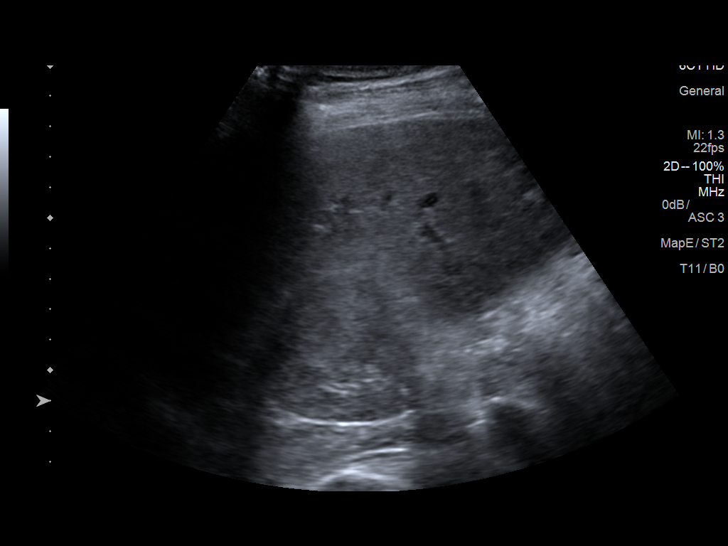
[im 9/25]
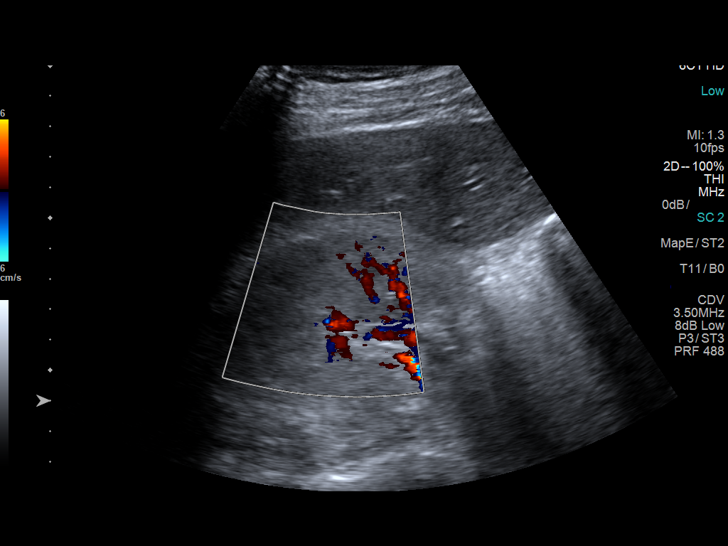
[im 10/25]
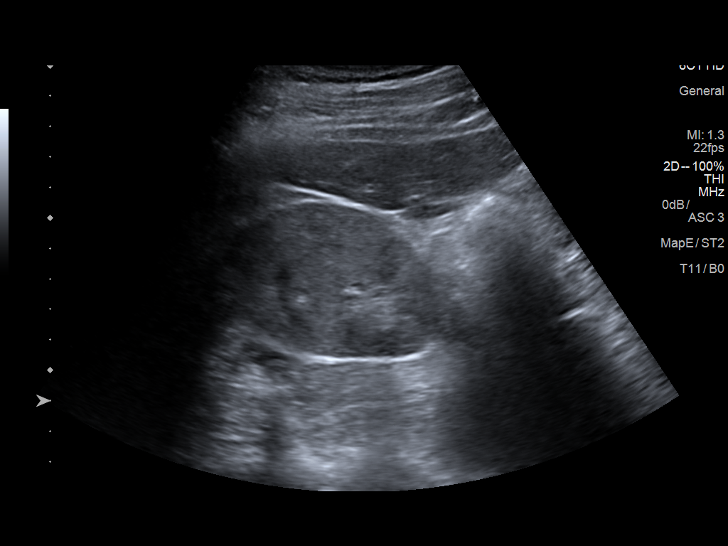
[im 12/25]
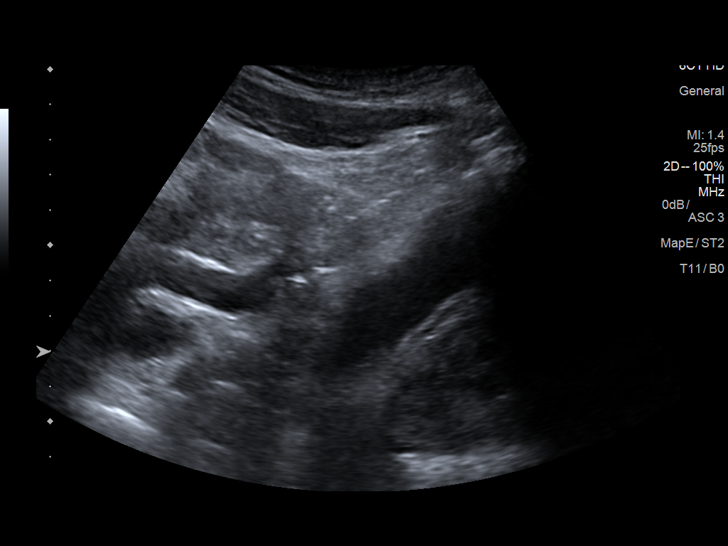
[im 14/25]
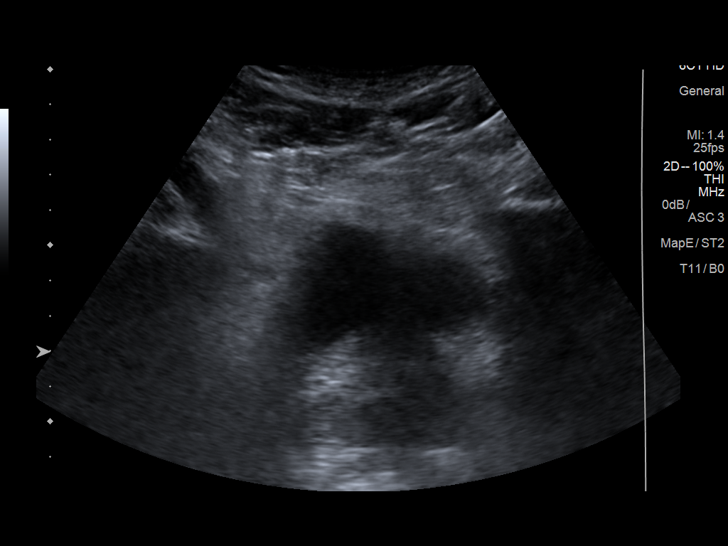
[im 16/25]
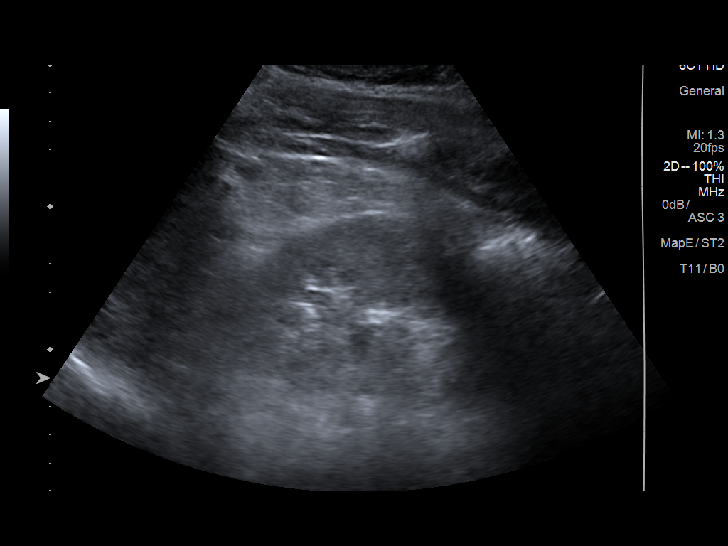
[im 17/25]
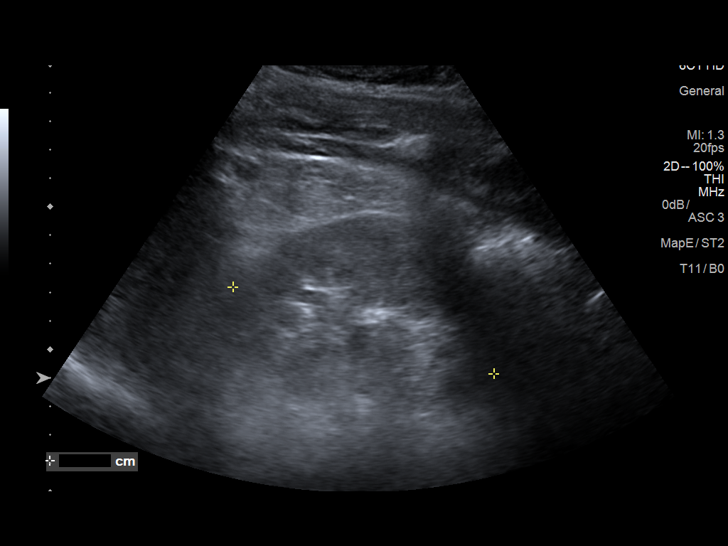
[im 19/25]
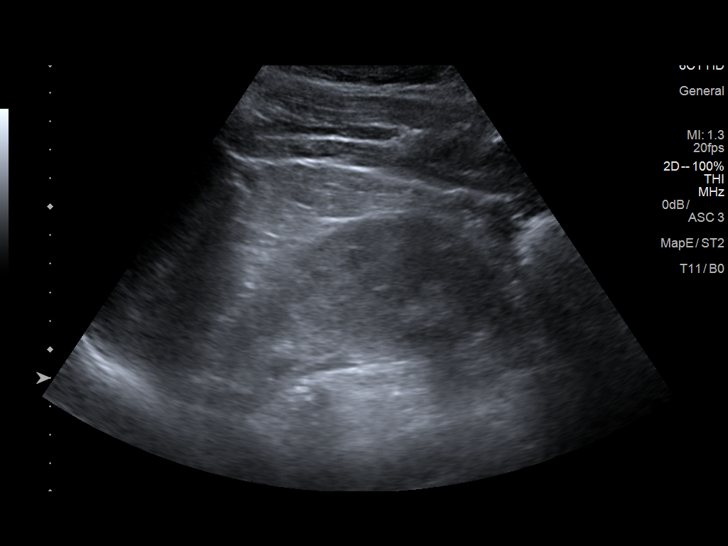
[im 21/25]
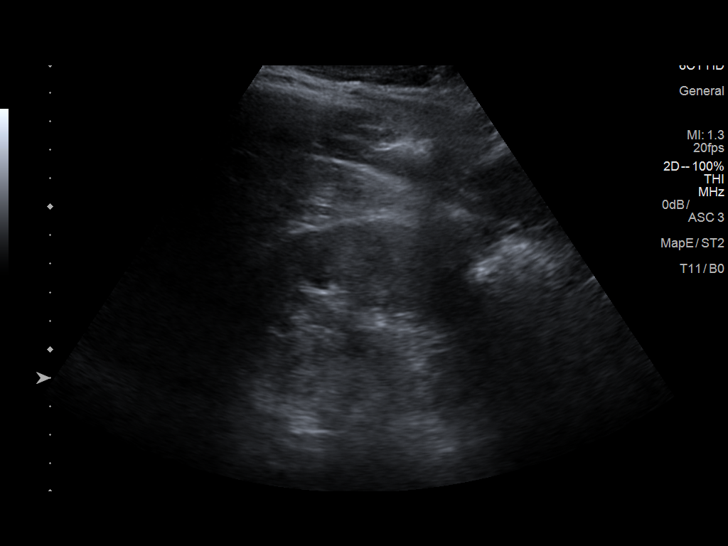
[im 23/25]
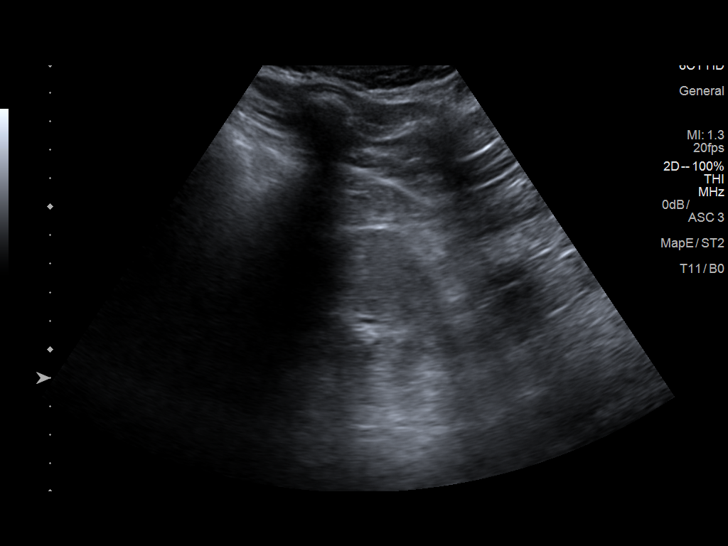
[im 25/25]
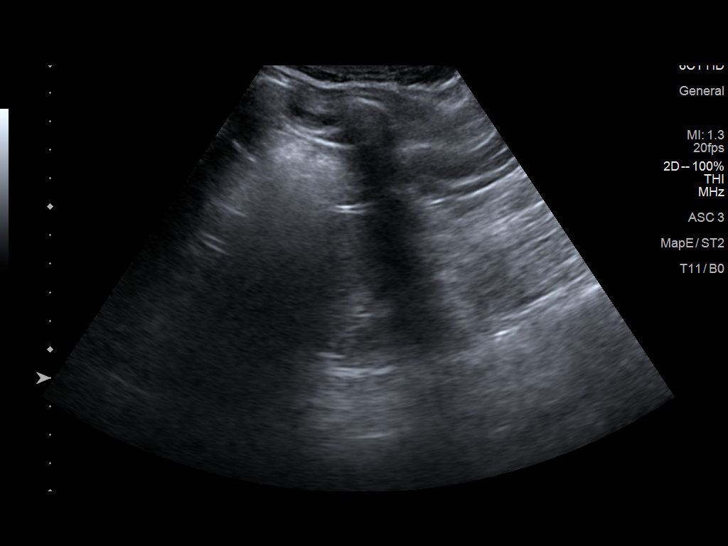

[14 of 25 positions shown; findings below may reference images not displayed]

FINDINGS: Right Kidney:

Length: 10 cm. The right kidney appears mildly echogenic. There is
no hydronephrosis or echogenic stone.

Left Kidney:

Length: 10 cm. There is moderate increased echogenicity of the left
renal parenchyma. No hydronephrosis or echogenic stone.

Bladder:

The urinary bladder is only partially distended and appears grossly
unremarkable.

The prostate gland is mildly enlarged and measures 5 cm in
transverse dimension.
IMPRESSION: Mild increased renal echogenicity may represent underlying medical
renal disease. No hydronephrosis.

## 2015-08-01 MED FILL — hydrALAZINE HCL 100 MG TABS: 100 | 30 days supply | Qty: 90 | Fill #3

## 2015-08-04 MED FILL — POTASSIUM CL ER 20 MEQ TAB: 20 | 30 days supply | Qty: 60 | Fill #4

## 2015-08-14 MED FILL — CALCITRIOL 0.25 MCG CAPSULE: 0.25 | 31 days supply | Qty: 31 | Fill #5

## 2015-08-21 MED FILL — BYSTOLIC 20 MG TABLET: 20 | 30 days supply | Qty: 30 | Fill #2

## 2015-09-16 MED FILL — CALCITRIOL 0.25 MCG CAPSULE: 0.25 | 31 days supply | Qty: 31 | Fill #6

## 2015-10-20 MED FILL — BYSTOLIC 20 MG TABLET: 20 | 30 days supply | Qty: 30 | Fill #3

## 2015-10-20 MED FILL — hydrALAZINE HCL 100 MG TABS: 100 | 30 days supply | Qty: 90 | Fill #4

## 2016-02-21 ENCOUNTER — Encounter (HOSPITAL_COMMUNITY): Payer: Self-pay | Admitting: Emergency Medicine

## 2016-02-21 ENCOUNTER — Emergency Department (HOSPITAL_COMMUNITY)
Admission: EM | Admit: 2016-02-21 | Discharge: 2016-02-22 | Disposition: A | Discharge status: Home / Self Care | Payer: Self-pay | Attending: Emergency Medicine | Admitting: Emergency Medicine

## 2016-02-21 DIAGNOSIS — I10 Essential (primary) hypertension: Secondary | ICD-10-CM

## 2016-02-21 DIAGNOSIS — I129 Hypertensive chronic kidney disease with stage 1 through stage 4 chronic kidney disease, or unspecified chronic kidney disease: Secondary | ICD-10-CM | POA: Insufficient documentation

## 2016-02-21 DIAGNOSIS — Z79899 Other long term (current) drug therapy: Secondary | ICD-10-CM | POA: Insufficient documentation

## 2016-02-21 DIAGNOSIS — J988 Other specified respiratory disorders: Secondary | ICD-10-CM | POA: Insufficient documentation

## 2016-02-21 DIAGNOSIS — Z87891 Personal history of nicotine dependence: Secondary | ICD-10-CM | POA: Insufficient documentation

## 2016-02-21 DIAGNOSIS — J069 Acute upper respiratory infection, unspecified: Secondary | ICD-10-CM

## 2016-02-21 DIAGNOSIS — N189 Chronic kidney disease, unspecified: Secondary | ICD-10-CM | POA: Insufficient documentation

## 2016-02-21 MED ORDER — HYDRALAZINE HCL 25 MG PO TABS
100.0000 mg | ORAL_TABLET | Freq: Once | ORAL | Status: AC
  Administered 2016-02-21: 100 mg via ORAL
  Filled 2016-02-21: qty 4

## 2016-02-21 MED ORDER — AMLODIPINE BESYLATE 5 MG PO TABS
10.0000 mg | ORAL_TABLET | Freq: Once | ORAL | Status: AC
  Administered 2016-02-21: 10 mg via ORAL
  Filled 2016-02-21: qty 2

## 2016-02-21 NOTE — ED Provider Notes (Signed)
[facility] DEPT Provider Note   CSN: [id number] Arrival date & time: 02/21/16  2046   By signing my name below, I, Jason [name], attest that this documentation has been prepared under the direction and in the presence of [name], MD. Electronically signed, Jason [name], ED Scribe. 02/21/16. 11:24 PM.   History   Chief Complaint Chief Complaint  Patient presents with  . Hypertension   The history is provided by the patient and medical records. No language interpreter was used.    HPI Comments: Jason [name] is a 56 y.o. male with Hx of HTN who presents to the Emergency Department complaining of gradually worsening high blood pressure x > 1 month. 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 He adds that he has experienced episodic dizziness and headaches d/t bystalic; he also notes intermittent headaches and palpitations d/t hydralazine. Pt reports he ran out of Amlodipine last month as well. He states that his naturopathic doctor told him to see a cardiologist recently, and he can not go to see one until the 02/26/2016.   Pt also states he has had a new onset fever x 2-3 days. He reports associated fever, congestion and cough. He denies N/V/D.  Past Medical History:  Diagnosis Date  . AKI (acute kidney injury) ([place]) 01/2015  . Hypertension     Patient Active Problem List   Diagnosis Date Noted  . Chronic kidney disease 02/27/2015  . Essential hypertension 02/26/2015  . Right inguinal hernia 02/26/2015  . AKI (acute kidney injury) ([place])   . Acute upper respiratory infection 01/30/2015  . Abnormal EKG 01/30/2015  . Acute kidney injury ([place]) 01/29/2015  . Anemia 01/29/2015    Past Surgical History:  Procedure Laterality Date  . TOE SURGERY    . TONSILLECTOMY         Home Medications    Prior to Admission medications   Medication Sig Start Date End Date Taking? Authorizing Provider  amLODipine (NORVASC)  10 MG tablet Take 1 tablet (10 mg total) by mouth at bedtime. 02/22/16   [name], MD  hydrALAZINE (APRESOLINE) 100 MG tablet Take 1 tablet (100 mg total) by mouth every 8 (eight) hours. 02/22/16   [name], MD  potassium chloride SA (K-DUR,KLOR-CON) 20 MEQ tablet Take 1 tablet (20 mEq total) by mouth 2 (two) times daily. Patient not taking: Reported on 02/21/2016 01/31/15   Ripudeep Krystal Eaton, MD    Family History Family History  Problem Relation Age of Onset  . Heart attack Mother 30    CABG hx  . Heart disease Mother   . Hypertension Mother   . Healthy Father   . Healthy Brother     Social History Social History  Substance Use Topics  . Smoking status: Former Research scientist (life sciences)  . Smokeless tobacco: Never Used     Comment: quit smoking in 1997  . Alcohol use No     Allergies   Lisinopril   Review of Systems Review of Systems  All other systems reviewed and are negative.  A complete 10 system review of systems was obtained and all systems are negative except as noted in the HPI and PMH.    Physical Exam Updated Vital Signs BP (!) 240/137 (BP Location: Right Arm)   Pulse 81   Temp 100.2 F (37.9 C) (Oral)   Resp 18   SpO2 98%   Physical Exam  Constitutional: He is oriented to person, place,  and time. He appears well-developed and well-nourished. No distress.  HENT:  Head: Normocephalic and atraumatic.  Right Ear: Hearing normal.  Left Ear: Hearing normal.  Nose: Nose normal.  Mouth/Throat: Oropharynx is clear and moist and mucous membranes are normal.  Eyes: Conjunctivae and EOM are normal. Pupils are equal, round, and reactive to light.  Neck: Normal range of motion. Neck supple.  Cardiovascular: Regular rhythm, S1 normal and S2 normal.  Exam reveals no gallop and no friction rub.   No murmur heard. Pulmonary/Chest: Effort normal and breath sounds normal. No respiratory distress. He exhibits no tenderness.  Abdominal: Soft. Normal appearance and  bowel sounds are normal. There is no hepatosplenomegaly. There is no tenderness. There is no rebound, no guarding, no tenderness at McBurney's point and negative Murphy's sign. No hernia.  Musculoskeletal: Normal range of motion.  Neurological: He is alert and oriented to person, place, and time. He has normal strength. No cranial nerve deficit or sensory deficit. Coordination normal. GCS eye subscore is 4. GCS verbal subscore is 5. GCS motor subscore is 6.  Skin: Skin is warm, dry and intact. No rash noted. No cyanosis.  Psychiatric: He has a normal mood and affect. His speech is normal and behavior is normal. Thought content normal.  Nursing note and vitals reviewed.    ED Treatments / Results  DIAGNOSTIC STUDIES: Oxygen Saturation is 98% on RA, normal by my interpretation.    COORDINATION OF CARE: 11:20 PM Discussed treatment plan with pt at bedside and pt agreed to plan. Will order lab work and review.  Labs (all labs ordered are listed, but only abnormal results are displayed) Labs Reviewed  CBC - Abnormal; Notable for the following:       Result Value   RBC 4.08 (*)    Hemoglobin 11.2 (*)    HCT 33.2 (*)    All other components within normal limits  BASIC METABOLIC PANEL - Abnormal; Notable for the following:    Potassium 2.8 (*)    Chloride 99 (*)    BUN 34 (*)    Creatinine, Ser 2.98 (*)    Calcium 8.7 (*)    GFR calc non Af Amer 22 (*)    GFR calc Af Amer 26 (*)    All other components within normal limits    EKG  EKG Interpretation None       Radiology No results found.  Procedures Procedures (including critical care time)  Medications Ordered in ED Medications  hydrALAZINE (APRESOLINE) tablet 100 mg (100 mg Oral Given 02/21/16 2356)  amLODipine (NORVASC) tablet 10 mg (10 mg Oral Given 02/21/16 2356)     Initial Impression / Assessment and Plan / ED Course  I have reviewed the triage vital signs and the nursing notes.  Pertinent labs & imaging results  that were available during my care of the patient were reviewed by me and considered in my medical decision making (see chart for details).     Patient presents to the emergency department for evaluation of elevated blood pressure. Patient has been off of his medications for more than a month. He has run out of some of his medications and has been avoiding taking others because of side effects. Patient is asymptomatic other than slight headache. He has normal neurologic function. Basic labs are unremarkable. Blood pressure is improving after being given his home meds. Will restart his medications, have follow-up with his primary doctor.  Patient did have a low-grade fever upon arrival. He reports  sinus congestion, sore throat and cough. Clinically this is consistent with viral upper respiratory infection. No clinical concern for pneumonia.  I personally performed the services described in this documentation, which was scribed in my presence. The recorded information has been reviewed and is accurate.   Final Clinical Impressions(s) / ED Diagnoses   Final diagnoses:  Hypertension, unspecified type  Upper respiratory tract infection, unspecified type    New Prescriptions Current Discharge Medication List       [name], MD 02/22/16 [phone]

## 2016-02-21 NOTE — ED Triage Notes (Signed)
Pt reports elevated BP today, states hasn't been able to get in to see MD. States states taking amlodipine, bystolic, and hydrochlorothiazide. No neuro deficits, headache in triage

## 2016-02-22 LAB — CBC
HEMATOCRIT: 33.2 % — AB (ref 39.0–52.0)
HEMOGLOBIN: 11.2 g/dL — AB (ref 13.0–17.0)
MCH: 27.5 pg (ref 26.0–34.0)
MCHC: 33.7 g/dL (ref 30.0–36.0)
MCV: 81.4 fL (ref 78.0–100.0)
Platelets: 253 10*3/uL (ref 150–400)
RBC: 4.08 MIL/uL — AB (ref 4.22–5.81)
RDW: 13 % (ref 11.5–15.5)
WBC: 6.8 10*3/uL (ref 4.0–10.5)

## 2016-02-22 LAB — BASIC METABOLIC PANEL
Anion gap: 9 (ref 5–15)
BUN: 34 mg/dL — AB (ref 6–20)
CHLORIDE: 99 mmol/L — AB (ref 101–111)
CO2: 30 mmol/L (ref 22–32)
Calcium: 8.7 mg/dL — ABNORMAL LOW (ref 8.9–10.3)
Creatinine, Ser: 2.98 mg/dL — ABNORMAL HIGH (ref 0.61–1.24)
GFR calc non Af Amer: 22 mL/min — ABNORMAL LOW (ref >60)
GFR, EST AFRICAN AMERICAN: 26 mL/min — AB (ref >60)
Glucose, Bld: 83 mg/dL (ref 65–99)
Potassium: 2.8 mmol/L — ABNORMAL LOW (ref 3.5–5.1)
SODIUM: 138 mmol/L (ref 135–145)

## 2016-02-22 MED ORDER — HYDRALAZINE HCL 100 MG PO TABS: 100.0000 mg | ORAL_TABLET | Freq: Three times a day (TID) | ORAL | 5 refills | Status: DC

## 2016-02-22 MED ORDER — AMLODIPINE BESYLATE 10 MG PO TABS: 10.0000 mg | ORAL_TABLET | Freq: Every day | ORAL | 5 refills | Status: DC

## 2016-02-22 NOTE — ED Notes (Signed)
Recollect labs

## 2016-02-23 MED FILL — hydrALAZINE HCL 100 MG TABS: 100 | 30 days supply | Qty: 90 | Fill #0

## 2016-02-23 MED FILL — AMLODIPINE BESYLATE 10 MG T: 10 | 30 days supply | Qty: 30 | Fill #0

## 2016-03-09 ENCOUNTER — Ambulatory Visit: Payer: Self-pay | Admitting: Family Medicine

## 2016-03-17 MED FILL — AMLODIPINE BESYLATE 10 MG T: 10 | 30 days supply | Qty: 30 | Fill #1

## 2016-03-17 MED FILL — hydrALAZINE HCL 100 MG TABS: 100 | 30 days supply | Qty: 90 | Fill #1

## 2016-04-04 ENCOUNTER — Emergency Department (HOSPITAL_COMMUNITY)
Admission: EM | Admit: 2016-04-04 | Discharge: 2016-04-04 | Disposition: A | Discharge status: Home / Self Care | Payer: Self-pay | Attending: Emergency Medicine | Admitting: Emergency Medicine

## 2016-04-04 ENCOUNTER — Encounter (HOSPITAL_COMMUNITY): Payer: Self-pay | Admitting: Emergency Medicine

## 2016-04-04 ENCOUNTER — Other Ambulatory Visit: Payer: Self-pay

## 2016-04-04 ENCOUNTER — Emergency Department (HOSPITAL_BASED_OUTPATIENT_CLINIC_OR_DEPARTMENT_OTHER)
Admission: RE | Admit: 2016-04-04 | Discharge: 2016-04-04 | Disposition: A | Discharge status: Home / Self Care | Payer: Self-pay | Source: Ambulatory Visit | Attending: Emergency Medicine | Admitting: Emergency Medicine

## 2016-04-04 DIAGNOSIS — Z87891 Personal history of nicotine dependence: Secondary | ICD-10-CM | POA: Insufficient documentation

## 2016-04-04 DIAGNOSIS — N189 Chronic kidney disease, unspecified: Secondary | ICD-10-CM | POA: Insufficient documentation

## 2016-04-04 DIAGNOSIS — Z79899 Other long term (current) drug therapy: Secondary | ICD-10-CM | POA: Insufficient documentation

## 2016-04-04 DIAGNOSIS — I129 Hypertensive chronic kidney disease with stage 1 through stage 4 chronic kidney disease, or unspecified chronic kidney disease: Secondary | ICD-10-CM | POA: Insufficient documentation

## 2016-04-04 DIAGNOSIS — I10 Essential (primary) hypertension: Secondary | ICD-10-CM

## 2016-04-04 LAB — VAS US CAROTID
LCCADDIAS: -23 cm/s
LCCAPSYS: 53 cm/s
LEFT ECA DIAS: -15 cm/s
LEFT VERTEBRAL DIAS: -19 cm/s
LICADSYS: -77 cm/s
LICAPSYS: -50 cm/s
Left CCA dist sys: -88 cm/s
Left CCA prox dias: 15 cm/s
Left ICA dist dias: -27 cm/s
Left ICA prox dias: -20 cm/s
RCCADSYS: -47 cm/s
RCCAPSYS: -93 cm/s
RIGHT ECA DIAS: -12 cm/s
RIGHT VERTEBRAL DIAS: 12 cm/s
Right CCA prox dias: -20 cm/s

## 2016-04-04 LAB — I-STAT CHEM 8, ED
BUN: 34 mg/dL — AB (ref 6–20)
CHLORIDE: 101 mmol/L (ref 101–111)
Calcium, Ion: 1.1 mmol/L — ABNORMAL LOW (ref 1.15–1.40)
Creatinine, Ser: 3.2 mg/dL — ABNORMAL HIGH (ref 0.61–1.24)
GLUCOSE: 85 mg/dL (ref 65–99)
HCT: 36 % — ABNORMAL LOW (ref 39.0–52.0)
Hemoglobin: 12.2 g/dL — ABNORMAL LOW (ref 13.0–17.0)
Potassium: 3 mmol/L — ABNORMAL LOW (ref 3.5–5.1)
SODIUM: 142 mmol/L (ref 135–145)
TCO2: 28 mmol/L (ref 0–100)

## 2016-04-04 MED ORDER — POTASSIUM CHLORIDE CRYS ER 20 MEQ PO TBCR
40.0000 meq | EXTENDED_RELEASE_TABLET | Freq: Once | ORAL | Status: AC
  Administered 2016-04-04: 40 meq via ORAL
  Filled 2016-04-04: qty 2

## 2016-04-04 MED ORDER — CARVEDILOL 6.25 MG PO TABS: 6.2500 mg | ORAL_TABLET | Freq: Two times a day (BID) | ORAL | 0 refills | Status: DC

## 2016-04-04 NOTE — ED Notes (Signed)
Patient transported to Ultrasound 

## 2016-04-04 NOTE — ED Triage Notes (Signed)
Pt c/o high blood pressure ongoing for a couple of months. Pt is currently taken hydralazine and amlodipine for HTN. Pt denies any other symptoms. 150/80 was his BP reading today at home.

## 2016-04-04 NOTE — ED Provider Notes (Signed)
Patient care assumed from [facility] and Dr. Rex Kras. Please see her note for further.  Briefly, patient presented with complaint of elevated blood pressure without other complaints. EKG is improved from previous tracing.  Plan at shift change is awaiting i-STAT Chem-8 results. If creatinine is around his baseline we'll discharge with new prescription for Coreg 6.25 mg. Patient has been on this previously, and tolerated this well. Chem-8 reveals a creatinine of 3.2. Most recent was 2.98. We'll discharge with Coreg 6.25 mg twice a day. We'll have him follow-up closely with his primary care provider. I discussed specific return precautions with the patient. I advised the patient to follow-up with their primary care provider this week. I advised the patient to return to the emergency department with new or worsening symptoms or new concerns. The patient verbalized understanding and agreement with plan.    Results for orders placed or performed during the hospital encounter of 04/04/16  I-stat Chem 8, ED  Result Value Ref Range   Sodium 142 135 - 145 mmol/L   Potassium 3.0 (L) 3.5 - 5.1 mmol/L   Chloride 101 101 - 111 mmol/L   BUN 34 (H) 6 - 20 mg/dL   Creatinine, Ser 3.20 (H) 0.61 - 1.24 mg/dL   Glucose, Bld 85 65 - 99 mg/dL   Calcium, Ion 1.10 (L) 1.15 - 1.40 mmol/L   TCO2 28 0 - 100 mmol/L   Hemoglobin 12.2 (L) 13.0 - 17.0 g/dL   HCT 36.0 (L) 39.0 - 52.0 %      EKG Interpretation  Date/Time:  Sunday April 04 2016 16:27:42 EDT Ventricular Rate:  66 PR Interval:    QRS Duration: 120 QT Interval:  428 QTC Calculation: 449 R Axis:   22 Text Interpretation:  Sinus rhythm LVH with secondary repolarization abnormality Anterior ST elevation, probably due to LVH previous T wave inversions in V3 have normalized and appear similar to older EKG Confirmed by LITTLE MD, RACHEL [phone]) on 04/04/2016 4:58:00 PM   Essential hypertension  Chronic kidney disease, unspecified CKD stage        [name], PA-C 04/04/16 [place], MD 04/04/16 [phone]

## 2016-04-04 NOTE — ED Provider Notes (Signed)
[facility] DEPT Provider Note   CSN: [id number] Arrival date & time: 04/04/16  1315     History   Chief Complaint Chief Complaint  Patient presents with  . Hypertension    HPI Jason [name] is a 56 y.o. male with PMH/O HTN who presents with hypertension. Patient reports that when he checked his blood pressure today, it was elevated ~156/90, prompting his ED visit. Patient denies experiencing other symptoms at home. In the ED currently, he has no other complaints. Patient takes Hydralazine and Amlodipine for his HTN and states he has been compliant with both medications. He has altered his diet to restrict sodium, canned and frozen foods. He is currently seeing a homeopathic doctor and is involved in a liquid diet. Patient denies any HA, vision changes, CP, SOB, numbness/tingling/weakness of his extremities.    The history is provided by the patient.    Past Medical History:  Diagnosis Date  . AKI (acute kidney injury) ([place]) 01/2015  . Hypertension     Patient Active Problem List   Diagnosis Date Noted  . Chronic kidney disease 02/27/2015  . Essential hypertension 02/26/2015  . Right inguinal hernia 02/26/2015  . AKI (acute kidney injury) ([place])   . Acute upper respiratory infection 01/30/2015  . Abnormal EKG 01/30/2015  . Acute kidney injury ([place]) 01/29/2015  . Anemia 01/29/2015    Past Surgical History:  Procedure Laterality Date  . TOE SURGERY    . TONSILLECTOMY         Home Medications    Prior to Admission medications   Medication Sig Start Date End Date Taking? Authorizing Provider  amLODipine (NORVASC) 10 MG tablet Take 1 tablet (10 mg total) by mouth at bedtime. 02/22/16  Yes [name], MD  hydrALAZINE (APRESOLINE) 100 MG tablet Take 1 tablet (100 mg total) by mouth every 8 (eight) hours. 02/22/16  Yes [name], MD  carvedilol (COREG) 6.25 MG tablet Take 1 tablet (6.25 mg total) by mouth 2 (two) times daily with a meal. 04/04/16    [name], PA-C  potassium chloride SA (K-DUR,KLOR-CON) 20 MEQ tablet Take 1 tablet (20 mEq total) by mouth 2 (two) times daily. Patient not taking: Reported on 02/21/2016 01/31/15   Ripudeep Krystal Eaton, MD    Family History Family History  Problem Relation Age of Onset  . Heart attack Mother 21    CABG hx  . Heart disease Mother   . Hypertension Mother   . Healthy Father   . Healthy Brother     Social History Social History  Substance Use Topics  . Smoking status: Former Research scientist (life sciences)  . Smokeless tobacco: Never Used     Comment: quit smoking in 1997  . Alcohol use No     Allergies   Lisinopril   Review of Systems Review of Systems  Constitutional: Negative for chills and fever.  HENT: Negative for congestion and rhinorrhea.   Eyes: Negative for visual disturbance.  Respiratory: Negative for cough and shortness of breath.   Cardiovascular: Negative for chest pain.  Gastrointestinal: Negative for abdominal pain, constipation, diarrhea, nausea and vomiting.  Genitourinary: Negative for dysuria and hematuria.  Musculoskeletal: Negative for back pain.  Neurological: Negative for dizziness, syncope, weakness, light-headedness, numbness and headaches.  Psychiatric/Behavioral: Negative for confusion.  All other systems reviewed and are negative.    Physical Exam Updated Vital Signs BP (!) 173/120   Pulse 67   Temp 97.9 F (36.6 C) (Oral)   Resp 13   Ht  6\' 2"  (1.88 m)   Wt 93 kg   SpO2 99%   BMI 26.32 kg/m   Physical Exam  Constitutional: He is oriented to person, place, and time. He appears well-developed and well-nourished.  HENT:  Head: Normocephalic and atraumatic.  Eyes: Conjunctivae, EOM and lids are normal.  Fundoscopic exam:      The right eye shows no papilledema.       The left eye shows no papilledema.  Neck: Full passive range of motion without pain. No JVD present. Carotid bruit is not present (bilaterally).  Right anterior pulsatile mass     Cardiovascular: Normal rate, regular rhythm and intact distal pulses.   Pulmonary/Chest: Effort normal and breath sounds normal.  Abdominal: Soft. Normal appearance. He exhibits no distension. There is no tenderness.  Musculoskeletal: Normal range of motion.  Neurological: He is alert and oriented to person, place, and time.  Cranial nerves III-XII intact Follows commands, Moves all extremities  5/5 strength to BUE and BLE  Sensation intact throughout  Normal finger to nose. No slurred speech. No facial droop.  Skin: Skin is warm and dry. Capillary refill takes less than 2 seconds.  Psychiatric: He has a normal mood and affect. His speech is normal and behavior is normal.     ED Treatments / Results  Labs (all labs ordered are listed, but only abnormal results are displayed) Labs Reviewed  I-STAT CHEM 8, ED - Abnormal; Notable for the following:       Result Value   Potassium 3.0 (*)    BUN 34 (*)    Creatinine, Ser 3.20 (*)    Calcium, Ion 1.10 (*)    Hemoglobin 12.2 (*)    HCT 36.0 (*)    All other components within normal limits  I-STAT CHEM 8, ED    EKG  EKG Interpretation  Date/Time:  Sunday April 04 2016 16:27:42 EDT Ventricular Rate:  66 PR Interval:    QRS Duration: 120 QT Interval:  428 QTC Calculation: 449 R Axis:   22 Text Interpretation:  Sinus rhythm LVH with secondary repolarization abnormality Anterior ST elevation, probably due to LVH previous T wave inversions in V3 have normalized and appear similar to older EKG Confirmed by LITTLE MD, RACHEL ([id number]) on 04/04/2016 4:58:00 PM       Radiology No results found.  Procedures Procedures (including critical care time)  Medications Ordered in ED Medications  potassium chloride SA (K-DUR,KLOR-CON) CR tablet 40 mEq (40 mEq Oral Given 04/04/16 1718)     Initial Impression / Assessment and Plan / ED Course  I have reviewed the triage vital signs and the nursing notes.  Pertinent labs & imaging  results that were available during my care of the patient were reviewed by me and considered in my medical decision making (see chart for details).    56 yo M with known history of HTN presents with HTN. Was checking his BP at home and was concerned when it was elevated. States he has been compliant with his medications: Hydralazine 100 mg tid and Amlodipine 10mg  . On ED arrival, BP is 210/109. Still no complaints throughout exam. During physical exam, a right anterior pulsatile neck was found. No carotid bruit noted bilaterally. Review of records do not show this in previous charts. Since patient has CKD at baseline, likely secondary to HTN, a CTA Neck cannot be done at this time. Will check carotid ultrasound for eval of neck mass. Plan to check BMP to eval BUN/Cr.  4:04 PM: Dr. Rex Kras discussed U/S results with vascular. No abnormality seen at this time.   Review of records indicate that patient had been put on Hydralazine 100 mg, Amlodipine 10, and Coreg 50 mg bid in January 2017 when he was being discharged after a Hypertension Emergency admission. At some point this was discontinued. Discussed with patient and he remembers previously being on Labetalol but was stopped because it made him sick. If EKG and Creatinine show no acute changes from previous, plan to discharge patient on Coreg 6.25 mg bid for hypertension. His PCP has been emailed regarding today's ED visit and need for follow-up. Instructed patient to call her office tomorrow to arrange for an available appointment in the next 2 days.   4:36 PM: EKG and Creatinine still pending. Patient signed out to provider at shift change.  Final Clinical Impressions(s) / ED Diagnoses   Final diagnoses:  Essential hypertension  Chronic kidney disease, unspecified CKD stage    New Prescriptions Discharge Medication List as of 04/04/2016  5:13 PM    START taking these medications   Details  carvedilol (COREG) 6.25 MG tablet Take 1 tablet (6.25  mg total) by mouth 2 (two) times daily with a meal., Starting Sun 04/04/2016, [place], [place] 04/04/16 [place], MD 04/06/16 2029

## 2016-04-04 NOTE — ED Notes (Signed)
EDP at bedside  

## 2016-04-04 NOTE — Progress Notes (Signed)
VASCULAR LAB PRELIMINARY  PRELIMINARY  PRELIMINARY  PRELIMINARY  Carotid duplex completed.    Preliminary report:  No ICA stenosis.  No carotid abnormalities noted.  Called report to Dr. Marius Ditch, [facility], RVT 04/04/2016, 3:43 PM

## 2016-04-05 MED FILL — CARVEDILOL 6.25 MG TABLET: 6.25 | 30 days supply | Qty: 60 | Fill #0

## 2016-04-20 MED FILL — hydrALAZINE HCL 100 MG TABS: 100 | 30 days supply | Qty: 90 | Fill #2

## 2016-04-20 MED FILL — AMLODIPINE BESYLATE 10 MG T: 10 | 30 days supply | Qty: 30 | Fill #2

## 2016-04-25 ENCOUNTER — Emergency Department (HOSPITAL_COMMUNITY)
Admission: EM | Admit: 2016-04-25 | Discharge: 2016-04-25 | Disposition: A | Discharge status: Home / Self Care | Payer: Self-pay | Attending: Emergency Medicine | Admitting: Emergency Medicine

## 2016-04-25 ENCOUNTER — Encounter (HOSPITAL_COMMUNITY): Payer: Self-pay

## 2016-04-25 DIAGNOSIS — I10 Essential (primary) hypertension: Secondary | ICD-10-CM

## 2016-04-25 DIAGNOSIS — Z87891 Personal history of nicotine dependence: Secondary | ICD-10-CM | POA: Insufficient documentation

## 2016-04-25 DIAGNOSIS — N189 Chronic kidney disease, unspecified: Secondary | ICD-10-CM | POA: Insufficient documentation

## 2016-04-25 DIAGNOSIS — I129 Hypertensive chronic kidney disease with stage 1 through stage 4 chronic kidney disease, or unspecified chronic kidney disease: Secondary | ICD-10-CM | POA: Insufficient documentation

## 2016-04-25 DIAGNOSIS — N289 Disorder of kidney and ureter, unspecified: Secondary | ICD-10-CM

## 2016-04-25 DIAGNOSIS — Z79899 Other long term (current) drug therapy: Secondary | ICD-10-CM | POA: Insufficient documentation

## 2016-04-25 MED ORDER — CLONIDINE HCL 0.2 MG PO TABS
0.2000 mg | ORAL_TABLET | Freq: Once | ORAL | Status: AC
  Administered 2016-04-25: 0.2 mg via ORAL
  Filled 2016-04-25: qty 1

## 2016-04-25 MED ORDER — CLONIDINE HCL 0.1 MG PO TABS: 0.1000 mg | ORAL_TABLET | Freq: Every day | ORAL | 0 refills | Status: DC

## 2016-04-25 MED ORDER — LOSARTAN POTASSIUM 50 MG PO TABS: 50.0000 mg | ORAL_TABLET | Freq: Every day | ORAL | 0 refills | Status: DC

## 2016-04-25 MED ORDER — LOSARTAN POTASSIUM 50 MG PO TABS
50.0000 mg | ORAL_TABLET | Freq: Once | ORAL | Status: DC
  Filled 2016-04-25: qty 1

## 2016-04-25 NOTE — ED Provider Notes (Signed)
[facility] DEPT Provider Note   CSN: [id number] Arrival date & time: 04/25/16  2147     History   Chief Complaint Chief Complaint  Patient presents with  . Hypertension    HPI Jason [name] is a 56 y.o. male.  HPI: 50. History of hypertension. On 3 medications. States initially that he used to be on lisinopril and like to get back on it. He was stopped because it gave him a cough. He states that he doesn't see his primary care very often. 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 He states that he had his renal function checked and was told it was "kinda okay". Review his most recent creatinine was above 3. He states that he saw Dr. Daria Pastures of nephrology a year ago and was supposed to follow-up but did not. He is a symptomatically. He has no shortness of breath. He has no chest pain. He has no stroke symptoms. He has no peripheral edema.  Past Medical History:  Diagnosis Date  . AKI (acute kidney injury) ([place]) 01/2015  . Hypertension     Patient Active Problem List   Diagnosis Date Noted  . Chronic kidney disease 02/27/2015  . Essential hypertension 02/26/2015  . Right inguinal hernia 02/26/2015  . AKI (acute kidney injury) ([place])   . Acute upper respiratory infection 01/30/2015  . Abnormal EKG 01/30/2015  . Acute kidney injury ([place]) 01/29/2015  . Anemia 01/29/2015    Past Surgical History:  Procedure Laterality Date  . TOE SURGERY    . TONSILLECTOMY         Home Medications    Prior to Admission medications   Medication Sig Start Date End Date Taking? Authorizing Provider  amLODipine (NORVASC) 10 MG tablet Take 1 tablet (10 mg total) by mouth at bedtime. 02/22/16   [name], MD  carvedilol (COREG) 6.25 MG tablet Take 1 tablet (6.25 mg total) by mouth 2 (two) times daily with a meal. 04/04/16   [name], PA-C  hydrALAZINE (APRESOLINE) 100 MG tablet Take 1 tablet (100 mg total) by mouth every 8  (eight) hours. 02/22/16   [name], MD  losartan (COZAAR) 50 MG tablet Take 1 tablet (50 mg total) by mouth daily. 04/25/16   Tanna Furry, MD  potassium chloride SA (K-DUR,KLOR-CON) 20 MEQ tablet Take 1 tablet (20 mEq total) by mouth 2 (two) times daily. Patient not taking: Reported on 02/21/2016 01/31/15   Ripudeep Krystal Eaton, MD    Family History Family History  Problem Relation Age of Onset  . Heart attack Mother 65    CABG hx  . Heart disease Mother   . Hypertension Mother   . Healthy Father   . Healthy Brother     Social History Social History  Substance Use Topics  . Smoking status: Former Research scientist (life sciences)  . Smokeless tobacco: Never Used     Comment: quit smoking in 1997  . Alcohol use No     Allergies   Lisinopril   Review of Systems Review of Systems  Constitutional: Negative for appetite change, chills, diaphoresis, fatigue and fever.  HENT: Negative for mouth sores, sore throat and trouble swallowing.   Eyes: Negative for visual disturbance.  Respiratory: Negative for cough, chest tightness, shortness of breath and wheezing.   Cardiovascular: Negative for chest pain.  Gastrointestinal: Negative for abdominal distention, abdominal pain, diarrhea, nausea and vomiting.  Endocrine: Negative for polydipsia, polyphagia and polyuria.  Genitourinary: Negative for  dysuria, frequency and hematuria.  Musculoskeletal: Negative for gait problem.  Skin: Negative for color change, pallor and rash.  Neurological: Negative for dizziness, syncope, light-headedness and headaches.  Hematological: Does not bruise/bleed easily.  Psychiatric/Behavioral: Negative for behavioral problems and confusion.     Physical Exam Updated Vital Signs BP (!) 222/113   Pulse 62   Temp 98.4 F (36.9 C) (Oral)   Resp 16   SpO2 99%   Physical Exam  Constitutional: He is oriented to person, place, and time. He appears well-developed and well-nourished. No distress.  HENT:  Head: Normocephalic.   Eyes: Conjunctivae are normal. Pupils are equal, round, and reactive to light. No scleral icterus.  Neck: Normal range of motion. Neck supple. No thyromegaly present.  Cardiovascular: Normal rate and regular rhythm.  Exam reveals no gallop and no friction rub.   No murmur heard. Pulmonary/Chest: Effort normal and breath sounds normal. No respiratory distress. He has no wheezes. He has no rales.  Abdominal: Soft. Bowel sounds are normal. He exhibits no distension. There is no tenderness. There is no rebound.  Musculoskeletal: Normal range of motion.  Neurological: He is alert and oriented to person, place, and time.  Skin: Skin is warm and dry. No rash noted.  Psychiatric: He has a normal mood and affect. His behavior is normal.     ED Treatments / Results  Labs (all labs ordered are listed, but only abnormal results are displayed) Labs Reviewed - No data to display  EKG  EKG Interpretation None       Radiology No results found.  Procedures Procedures (including critical care time)  Medications Ordered in ED Medications  losartan (COZAAR) tablet 50 mg (not administered)     Initial Impression / Assessment and Plan / ED Course  I have reviewed the triage vital signs and the nursing notes.  Pertinent labs & imaging results that were available during my care of the patient were reviewed by me and considered in my medical decision making (see chart for details).     Will add clonidine to his current regimen. I discussed with him his most recent abnormal creatinine which was obtained only 2 weeks ago. Do not feel he needs testing tonight. Have encouraged him follow up with primary care physician and his nephrologist. Ask him to be diligent about checking recording his blood pressure daily. Advised weight control, exercise, salt restriction and diligence with follow-up and medications  Discuss side effects of untreated blood pressure including renal failure resulting in  death, dialysis, her transplantation. Stroke heart disease peripheral edema congestive heart failure.  Final Clinical Impressions(s) / ED Diagnoses   Final diagnoses:  Hypertension, unspecified type    New Prescriptions New Prescriptions   LOSARTAN (COZAAR) 50 MG TABLET    Take 1 tablet (50 mg total) by mouth daily.     Tanna Furry, MD 04/25/16 [phone]

## 2016-04-25 NOTE — ED Triage Notes (Signed)
Pt complaining of hypertension. Pt denies any headache, dizziness, or chest pain. Pt states hx of same, takes meds as rx'd. Pt a/o x 4 at triage. Pt states hx of taking lisinopril, states bp responds best with ace inhibitor. Pt hypertensive at triage.

## 2016-04-25 NOTE — Discharge Instructions (Signed)
Continue current medicines. Begin clonidine as prescribed Follow-up with your primary care physician.  Check and record her blood pressures daily. Your most recent kidney testing shows that your kidney function has worsened over the last year. It is recommended that you be diligent about following with your PCP and Nephrologist to avoid worsening that could result in kidney failure and need for dialysis

## 2016-04-26 MED FILL — cloNIDine HCL 0.1 MG TABS: 0.1 | 30 days supply | Qty: 30 | Fill #0

## 2016-05-03 ENCOUNTER — Encounter (HOSPITAL_COMMUNITY): Payer: Self-pay | Admitting: Emergency Medicine

## 2016-05-03 ENCOUNTER — Emergency Department (HOSPITAL_COMMUNITY)
Admission: EM | Admit: 2016-05-03 | Discharge: 2016-05-03 | Disposition: A | Discharge status: Home / Self Care | Payer: Self-pay | Attending: Emergency Medicine | Admitting: Emergency Medicine

## 2016-05-03 DIAGNOSIS — I10 Essential (primary) hypertension: Secondary | ICD-10-CM

## 2016-05-03 DIAGNOSIS — N189 Chronic kidney disease, unspecified: Secondary | ICD-10-CM | POA: Insufficient documentation

## 2016-05-03 DIAGNOSIS — I129 Hypertensive chronic kidney disease with stage 1 through stage 4 chronic kidney disease, or unspecified chronic kidney disease: Secondary | ICD-10-CM | POA: Insufficient documentation

## 2016-05-03 DIAGNOSIS — Z87891 Personal history of nicotine dependence: Secondary | ICD-10-CM | POA: Insufficient documentation

## 2016-05-03 DIAGNOSIS — Z79899 Other long term (current) drug therapy: Secondary | ICD-10-CM | POA: Insufficient documentation

## 2016-05-03 MED ORDER — CLONIDINE HCL 0.2 MG PO TABS: 0.2000 mg | ORAL_TABLET | Freq: Two times a day (BID) | ORAL | 0 refills | Status: DC

## 2016-05-03 MED ORDER — LORAZEPAM 1 MG PO TABS: 1.0000 mg | ORAL_TABLET | Freq: Three times a day (TID) | ORAL | 0 refills | Status: DC | PRN

## 2016-05-03 NOTE — ED Provider Notes (Signed)
[facility] DEPT Provider Note   CSN: [id number] Arrival date & time: 05/03/16  1953  By signing my name below, I, Jason [name], attest that this documentation has been prepared under the direction and in the presence of Jason Fraise, MD. Electronically Signed: Reola [name], ED Scribe. 05/03/16. 11:24 PM.  History   Chief Complaint Chief Complaint  Patient presents with  . Hypertension   The history is provided by the patient and medical records. No language interpreter was used.  Hypertension  This is a recurrent problem. The current episode started 6 to 12 hours ago. The problem occurs every several days. The problem has been gradually worsening. Pertinent negatives include no chest pain, no abdominal pain, no headaches and no shortness of breath. Exacerbated by: being seen by a clinician. The symptoms are relieved by medications. Treatments tried: HTN medications. The treatment provided no relief.    HPI Comments: Jason [name] is a 56 y.o. male with a PMHx of HTN and CKD, who presents to the Emergency Department complaining of recurrent episodes of hypertension beginning prior to arrival. Per pt, he is prescribed four different medications for this issue, including Amlodipine, Hydralazine, Clonidine, and Coreg. Per prior chart review, pt was seen in the ED for same on 04/25/16 (~8 days ago) and at that time Clonidine was added to his daily regimen. He has been compliant with all of these medications recently; however, he states that his systolic number at home has been running in the 120s-140s consistently, but he will occasionally see a systolic number upwards of 170. Per pt, he does checks his blood pressure at home frequently, sometimes upwards of 20 times a day. He states that he has been anxious recently d/t his elevated blood pressure and that he has been pulled from his job d/t being unable to pass his physical exams b/c of his HTN. Pt also mentions that when he is  being seen by a clinician that his numbers tend to worsen. He was working as a Arts administrator. No h/o prior MIs or CVAs. He has been recently trying to cut out caffeine from his regimen. He denies fever, nausea, vomiting, headache, visual disturbance/loss, chest pain, shortness of breath, syncope, weakness, dizziness, or any other associated symptoms.   PCP: Dorena Dew, FNP  Past Medical History:  Diagnosis Date  . AKI (acute kidney injury) ([place]) 01/2015  . Hypertension    Patient Active Problem List   Diagnosis Date Noted  . Chronic kidney disease 02/27/2015  . Essential hypertension 02/26/2015  . Right inguinal hernia 02/26/2015  . AKI (acute kidney injury) ([place])   . Acute upper respiratory infection 01/30/2015  . Abnormal EKG 01/30/2015  . Acute kidney injury ([place]) 01/29/2015  . Anemia 01/29/2015   Past Surgical History:  Procedure Laterality Date  . TOE SURGERY    . TONSILLECTOMY      Home Medications    Prior to Admission medications   Medication Sig Start Date End Date Taking? Authorizing Provider  amLODipine (NORVASC) 10 MG tablet Take 1 tablet (10 mg total) by mouth at bedtime. 02/22/16   [name], MD  carvedilol (COREG) 6.25 MG tablet Take 1 tablet (6.25 mg total) by mouth 2 (two) times daily with a meal. 04/04/16   [name], PA-C  cloNIDine (CATAPRES) 0.1 MG tablet Take 1 tablet (0.1 mg total) by mouth daily. 04/25/16   Tanna Furry, MD  hydrALAZINE (APRESOLINE) 100 MG tablet Take 1 tablet (100 mg total) by mouth  every 8 (eight) hours. 02/22/16   [name], MD  losartan (COZAAR) 50 MG tablet Take 1 tablet (50 mg total) by mouth daily. 04/25/16   Tanna Furry, MD  potassium chloride SA (K-DUR,KLOR-CON) 20 MEQ tablet Take 1 tablet (20 mEq total) by mouth 2 (two) times daily. Patient not taking: Reported on 02/21/2016 01/31/15   Ripudeep Krystal Eaton, MD   Family History Family History  Problem Relation Age of Onset  . Heart attack Mother  39    CABG hx  . Heart disease Mother   . Hypertension Mother   . Healthy Father   . Healthy Brother    Social History Social History  Substance Use Topics  . Smoking status: Former Research scientist (life sciences)  . Smokeless tobacco: Never Used     Comment: quit smoking in 1997  . Alcohol use No   Allergies   Lisinopril  Review of Systems Review of Systems  Constitutional: Negative for fever.  Eyes: Negative for visual disturbance.  Respiratory: Negative for shortness of breath.   Cardiovascular: Negative for chest pain.  Gastrointestinal: Negative for abdominal pain, nausea and vomiting.  Neurological: Negative for dizziness, syncope, weakness and headaches.  Psychiatric/Behavioral: The patient is nervous/anxious.   All other systems reviewed and are negative.  Physical Exam Updated Vital Signs BP (!) 199/104 (BP Location: Left Arm)   Pulse (!) 56   Temp 98.3 F (36.8 C) (Oral)   Resp 12   SpO2 98%   Physical Exam  CONSTITUTIONAL: Well developed/well nourished HEAD: Normocephalic/atraumatic EYES: EOMI/PERRL ENMT: Mucous membranes moist NECK: supple no meningeal signs SPINE/BACK:entire spine nontender CV: S1/S2 noted, no murmurs/rubs/gallops noted LUNGS: Lungs are clear to auscultation bilaterally, no apparent distress ABDOMEN: soft, nontender NEURO: Pt is awake/alert/appropriate, moves all extremitiesx4.  No facial droop.   EXTREMITIES: pulses normal/equal, full ROM SKIN: warm, color normal PSYCH: mildly anxious  ED Treatments / Results  DIAGNOSTIC STUDIES: Oxygen Saturation is 98% on RA, normal by my interpretation.   COORDINATION OF CARE: 11:24 PM-Discussed next steps with pt. Pt verbalized understanding and is agreeable with the plan.   Labs (all labs ordered are listed, but only abnormal results are displayed) Labs Reviewed - No data to display  EKG  EKG Interpretation None      Radiology No results found.  Procedures Procedures   Medications Ordered in  ED Medications - No data to display  Initial Impression / Assessment and Plan / ED Course  I have reviewed the triage vital signs and the nursing notes.   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 Increased clonidine to 0.2mg  BID 2. Ativan PRN for anxiety 3. Check BP twice a day - he does not need to check 20 times/day  Pt agreeable with plan   Final Clinical Impressions(s) / ED Diagnoses   Final diagnoses:  Essential hypertension   New Prescriptions Discharge Medication List as of 05/03/2016 11:28 PM    START taking these medications   Details  LORazepam (ATIVAN) 1 MG tablet Take 1 tablet (1 mg total) by mouth every 8 (eight) hours as needed for anxiety., Starting Mon 05/03/2016, Print       I personally performed the services described in this documentation, which was scribed in my presence. The recorded information has been  reviewed and is accurate.       Jason Fraise, MD 05/04/16 [phone]

## 2016-05-03 NOTE — ED Triage Notes (Signed)
Patient arrives with complaint of hypertension. Chronic history of the same. States he has been taking numerous different medications for his BP but his blood pressure is wildly variable. Numbers at home are typically 120-140. When at doctors office and occasionally at home he sees SBP readings of 170's. Also endorses anxiety and states that this exacerbates the hypertension.

## 2016-05-04 MED FILL — ?CLONIDINE HCL 0.2 MG TABLE: 0.2 | 14 days supply | Qty: 28 | Fill #0

## 2016-05-26 MED FILL — ?AMLODIPINE BESYLATE 10 MG: 10 | 30 days supply | Qty: 30 | Fill #3

## 2016-05-26 MED FILL — hydrALAZINE HCL 100 MG TABS: 100 | 30 days supply | Qty: 90 | Fill #3

## 2016-06-28 MED FILL — AMLODIPINE BESYLATE 10 MG T: 10 | 30 days supply | Qty: 30 | Fill #4

## 2016-07-27 MED FILL — ?AMLODIPINE BESYLATE 10 MG: 10 | 30 days supply | Qty: 30 | Fill #5

## 2016-07-27 MED FILL — hydrALAZINE HCL 100 MG TABS: 100 | 30 days supply | Qty: 90 | Fill #4

## 2017-01-11 DIAGNOSIS — J90 Pleural effusion, not elsewhere classified: Secondary | ICD-10-CM

## 2017-01-11 HISTORY — DX: Pleural effusion, not elsewhere classified: J90

## 2017-01-16 ENCOUNTER — Emergency Department (HOSPITAL_COMMUNITY): Payer: Medicaid Other

## 2017-01-16 ENCOUNTER — Encounter (HOSPITAL_COMMUNITY): Payer: Self-pay

## 2017-01-16 ENCOUNTER — Inpatient Hospital Stay (HOSPITAL_COMMUNITY)
Admission: EM | Admit: 2017-01-16 | Discharge: 2017-01-26 | DRG: 673 | Disposition: A | Discharge status: Home / Self Care | Payer: Medicaid Other | Attending: Nephrology | Admitting: Nephrology

## 2017-01-16 DIAGNOSIS — I132 Hypertensive heart and chronic kidney disease with heart failure and with stage 5 chronic kidney disease, or end stage renal disease: Secondary | ICD-10-CM | POA: Diagnosis present

## 2017-01-16 DIAGNOSIS — E875 Hyperkalemia: Secondary | ICD-10-CM | POA: Diagnosis present

## 2017-01-16 DIAGNOSIS — L0390 Cellulitis, unspecified: Secondary | ICD-10-CM

## 2017-01-16 DIAGNOSIS — E43 Unspecified severe protein-calorie malnutrition: Secondary | ICD-10-CM | POA: Diagnosis present

## 2017-01-16 DIAGNOSIS — Z87891 Personal history of nicotine dependence: Secondary | ICD-10-CM | POA: Diagnosis not present

## 2017-01-16 DIAGNOSIS — Z79899 Other long term (current) drug therapy: Secondary | ICD-10-CM | POA: Diagnosis not present

## 2017-01-16 DIAGNOSIS — Z9114 Patient's other noncompliance with medication regimen: Secondary | ICD-10-CM

## 2017-01-16 DIAGNOSIS — E8779 Other fluid overload: Secondary | ICD-10-CM

## 2017-01-16 DIAGNOSIS — L03116 Cellulitis of left lower limb: Secondary | ICD-10-CM | POA: Diagnosis present

## 2017-01-16 DIAGNOSIS — D631 Anemia in chronic kidney disease: Secondary | ICD-10-CM | POA: Diagnosis present

## 2017-01-16 DIAGNOSIS — I509 Heart failure, unspecified: Secondary | ICD-10-CM

## 2017-01-16 DIAGNOSIS — J9601 Acute respiratory failure with hypoxia: Secondary | ICD-10-CM | POA: Diagnosis present

## 2017-01-16 DIAGNOSIS — I878 Other specified disorders of veins: Secondary | ICD-10-CM | POA: Diagnosis present

## 2017-01-16 DIAGNOSIS — Z008 Encounter for other general examination: Secondary | ICD-10-CM

## 2017-01-16 DIAGNOSIS — E872 Acidosis: Secondary | ICD-10-CM | POA: Diagnosis present

## 2017-01-16 DIAGNOSIS — R0602 Shortness of breath: Secondary | ICD-10-CM | POA: Diagnosis present

## 2017-01-16 DIAGNOSIS — R030 Elevated blood-pressure reading, without diagnosis of hypertension: Secondary | ICD-10-CM | POA: Diagnosis not present

## 2017-01-16 DIAGNOSIS — M25562 Pain in left knee: Secondary | ICD-10-CM | POA: Diagnosis present

## 2017-01-16 DIAGNOSIS — L03115 Cellulitis of right lower limb: Secondary | ICD-10-CM | POA: Diagnosis present

## 2017-01-16 DIAGNOSIS — I5043 Acute on chronic combined systolic (congestive) and diastolic (congestive) heart failure: Secondary | ICD-10-CM | POA: Diagnosis present

## 2017-01-16 DIAGNOSIS — I83009 Varicose veins of unspecified lower extremity with ulcer of unspecified site: Secondary | ICD-10-CM

## 2017-01-16 DIAGNOSIS — I161 Hypertensive emergency: Secondary | ICD-10-CM | POA: Diagnosis present

## 2017-01-16 DIAGNOSIS — I872 Venous insufficiency (chronic) (peripheral): Secondary | ICD-10-CM | POA: Diagnosis present

## 2017-01-16 DIAGNOSIS — R7989 Other specified abnormal findings of blood chemistry: Secondary | ICD-10-CM

## 2017-01-16 DIAGNOSIS — R778 Other specified abnormalities of plasma proteins: Secondary | ICD-10-CM

## 2017-01-16 DIAGNOSIS — N179 Acute kidney failure, unspecified: Secondary | ICD-10-CM | POA: Diagnosis present

## 2017-01-16 DIAGNOSIS — N186 End stage renal disease: Secondary | ICD-10-CM | POA: Diagnosis present

## 2017-01-16 DIAGNOSIS — I10 Essential (primary) hypertension: Secondary | ICD-10-CM | POA: Diagnosis present

## 2017-01-16 DIAGNOSIS — I472 Ventricular tachycardia: Secondary | ICD-10-CM | POA: Diagnosis present

## 2017-01-16 DIAGNOSIS — N2581 Secondary hyperparathyroidism of renal origin: Secondary | ICD-10-CM | POA: Diagnosis present

## 2017-01-16 DIAGNOSIS — Z992 Dependence on renal dialysis: Secondary | ICD-10-CM | POA: Diagnosis not present

## 2017-01-16 DIAGNOSIS — Z9119 Patient's noncompliance with other medical treatment and regimen: Secondary | ICD-10-CM | POA: Diagnosis not present

## 2017-01-16 DIAGNOSIS — I428 Other cardiomyopathies: Secondary | ICD-10-CM | POA: Diagnosis present

## 2017-01-16 DIAGNOSIS — L97909 Non-pressure chronic ulcer of unspecified part of unspecified lower leg with unspecified severity: Secondary | ICD-10-CM

## 2017-01-16 DIAGNOSIS — I4729 Other ventricular tachycardia: Secondary | ICD-10-CM

## 2017-01-16 DIAGNOSIS — G9349 Other encephalopathy: Secondary | ICD-10-CM | POA: Diagnosis present

## 2017-01-16 HISTORY — DX: Cellulitis, unspecified: L03.90

## 2017-01-16 LAB — I-STAT TROPONIN, ED: Troponin i, poc: 0.43 ng/mL (ref 0.00–0.08)

## 2017-01-16 LAB — BASIC METABOLIC PANEL
Anion gap: 12 (ref 5–15)
BUN: 118 mg/dL — ABNORMAL HIGH (ref 6–20)
CO2: 21 mmol/L — ABNORMAL LOW (ref 22–32)
Calcium: 8.8 mg/dL — ABNORMAL LOW (ref 8.9–10.3)
Chloride: 102 mmol/L (ref 101–111)
Creatinine, Ser: 6.98 mg/dL — ABNORMAL HIGH (ref 0.61–1.24)
GFR calc Af Amer: 9 mL/min — ABNORMAL LOW (ref >60)
GFR calc non Af Amer: 8 mL/min — ABNORMAL LOW (ref >60)
Glucose, Bld: 105 mg/dL — ABNORMAL HIGH (ref 65–99)
Potassium: 5.4 mmol/L — ABNORMAL HIGH (ref 3.5–5.1)
Sodium: 135 mmol/L (ref 135–145)

## 2017-01-16 LAB — RAPID URINE DRUG SCREEN, HOSP PERFORMED
AMPHETAMINES: NOT DETECTED
BENZODIAZEPINES: NOT DETECTED
Barbiturates: NOT DETECTED
Cocaine: NOT DETECTED
OPIATES: NOT DETECTED
Tetrahydrocannabinol: NOT DETECTED

## 2017-01-16 LAB — CBC WITH DIFFERENTIAL/PLATELET
Basophils Absolute: 0 10*3/uL (ref 0.0–0.1)
Basophils Relative: 0 %
Eosinophils Absolute: 0 10*3/uL (ref 0.0–0.7)
Eosinophils Relative: 0 %
HCT: 39.8 % (ref 39.0–52.0)
Hemoglobin: 14.1 g/dL (ref 13.0–17.0)
Lymphocytes Relative: 6 %
Lymphs Abs: 0.4 10*3/uL — ABNORMAL LOW (ref 0.7–4.0)
MCH: 30.3 pg (ref 26.0–34.0)
MCHC: 35.4 g/dL (ref 30.0–36.0)
MCV: 85.4 fL (ref 78.0–100.0)
Monocytes Absolute: 0.5 10*3/uL (ref 0.1–1.0)
Monocytes Relative: 9 %
Neutro Abs: 5.1 10*3/uL (ref 1.7–7.7)
Neutrophils Relative %: 85 %
Platelets: 370 10*3/uL (ref 150–400)
RBC: 4.66 MIL/uL (ref 4.22–5.81)
RDW: 13.3 % (ref 11.5–15.5)
WBC: 6 10*3/uL (ref 4.0–10.5)

## 2017-01-16 LAB — BRAIN NATRIURETIC PEPTIDE: B Natriuretic Peptide: >4500 pg/mL — ABNORMAL HIGH (ref 0.0–100.0)

## 2017-01-16 LAB — I-STAT CHEM 8, ED
BUN: 94 mg/dL — ABNORMAL HIGH (ref 6–20)
Calcium, Ion: 1.13 mmol/L — ABNORMAL LOW (ref 1.15–1.40)
Chloride: 102 mmol/L (ref 101–111)
Creatinine, Ser: 7 mg/dL — ABNORMAL HIGH (ref 0.61–1.24)
Glucose, Bld: 101 mg/dL — ABNORMAL HIGH (ref 65–99)
HCT: 42 % (ref 39.0–52.0)
Hemoglobin: 14.3 g/dL (ref 13.0–17.0)
Potassium: 5.3 mmol/L — ABNORMAL HIGH (ref 3.5–5.1)
Sodium: 136 mmol/L (ref 135–145)
TCO2: 24 mmol/L (ref 22–32)

## 2017-01-16 LAB — URINALYSIS, ROUTINE W REFLEX MICROSCOPIC
Bilirubin Urine: NEGATIVE
Glucose, UA: NEGATIVE mg/dL
Ketones, ur: NEGATIVE mg/dL
Leukocytes, UA: NEGATIVE
Nitrite: NEGATIVE
Protein, ur: 100 mg/dL — AB
Specific Gravity, Urine: 1.013 (ref 1.005–1.030)
Squamous Epithelial / LPF: NONE SEEN
pH: 5 (ref 5.0–8.0)

## 2017-01-16 LAB — HEPATIC FUNCTION PANEL
ALT: 64 U/L — ABNORMAL HIGH (ref 17–63)
AST: 24 U/L (ref 15–41)
Albumin: 2.8 g/dL — ABNORMAL LOW (ref 3.5–5.0)
Alkaline Phosphatase: 48 U/L (ref 38–126)
Bilirubin, Direct: 0.1 mg/dL (ref 0.1–0.5)
Indirect Bilirubin: 0.9 mg/dL (ref 0.3–0.9)
Total Bilirubin: 1 mg/dL (ref 0.3–1.2)
Total Protein: 6.6 g/dL (ref 6.5–8.1)

## 2017-01-16 LAB — PHOSPHORUS: PHOSPHORUS: 7.9 mg/dL — AB (ref 2.5–4.6)

## 2017-01-16 IMAGING — CT CT TIBIA FIBULA *L* W/O CM
2 series · 11 of 20 positions shown, 13 images · non-contrast
Comparison: None.

CLINICAL DATA: Cellulitis.  Evaluate for osteomyelitis.

EXAM:
CT OF THE LOWER LEFT EXTREMITY WITHOUT CONTRAST
TECHNIQUE: Multidetector CT imaging of the lower left extremity was performed
according to the standard protocol.

[Series 4: axial st · axial · 0.66mm/px · z∈[-1766,-1304]mm · 8 of 274 slices shown, 10 images]
[im 22/274  soft-tissue]
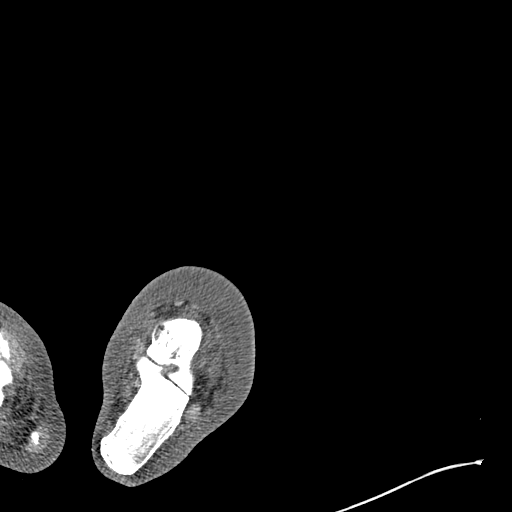
[im 22/274  bone]
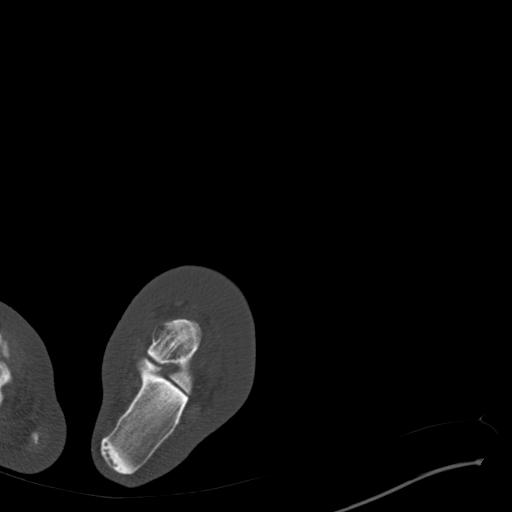
[im 64/274  bone]
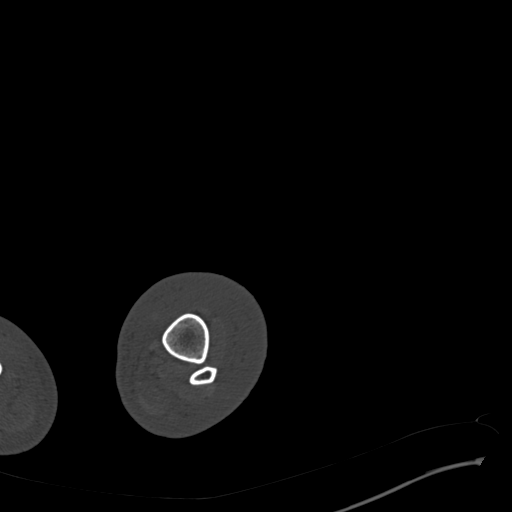
[im 85/274  bone]
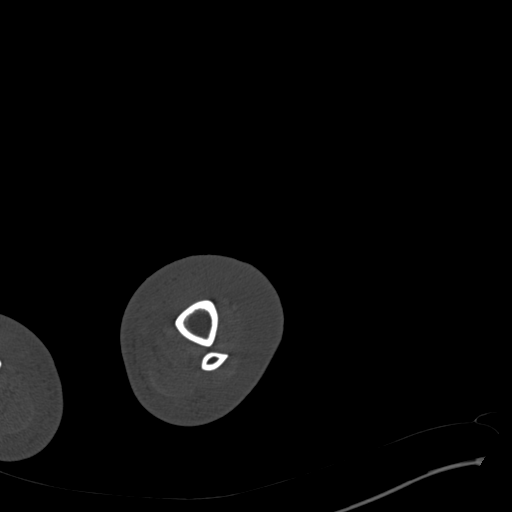
[im 127/274  bone]
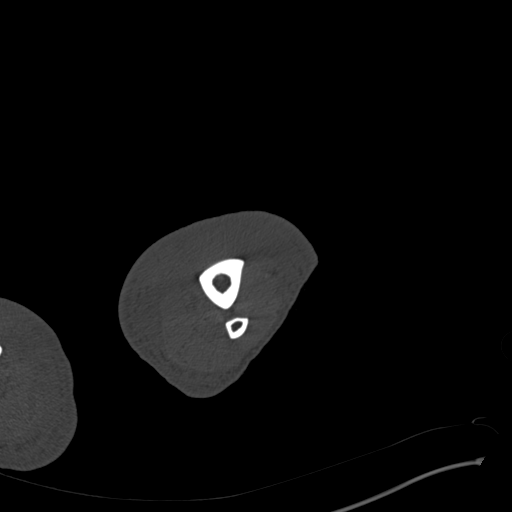
[im 148/274  soft-tissue]
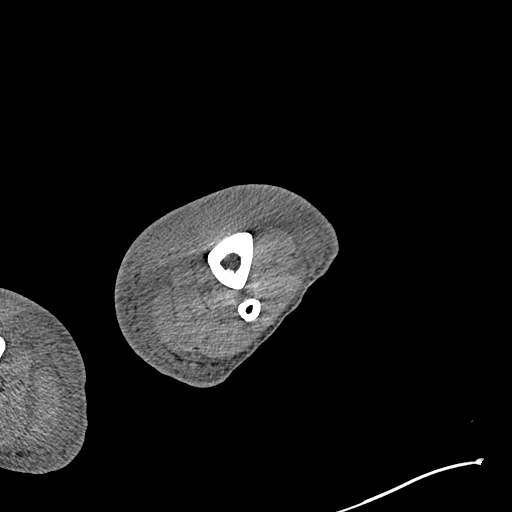
[im 148/274  bone]
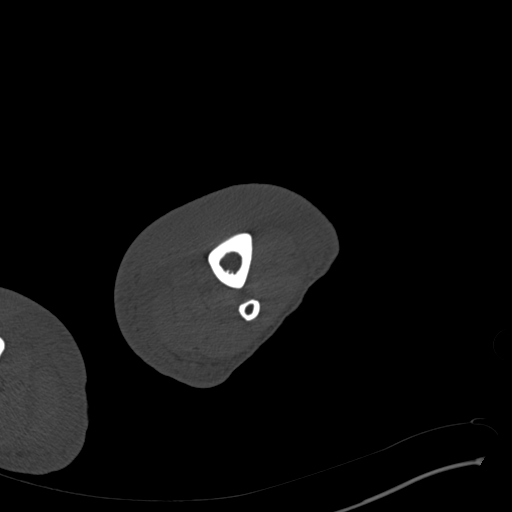
[im 190/274  bone]
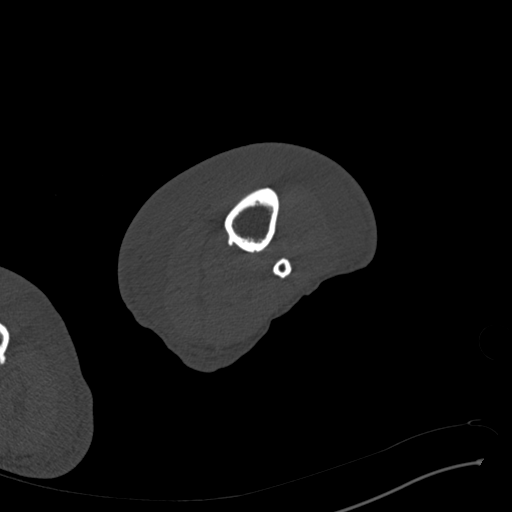
[im 211/274  bone]
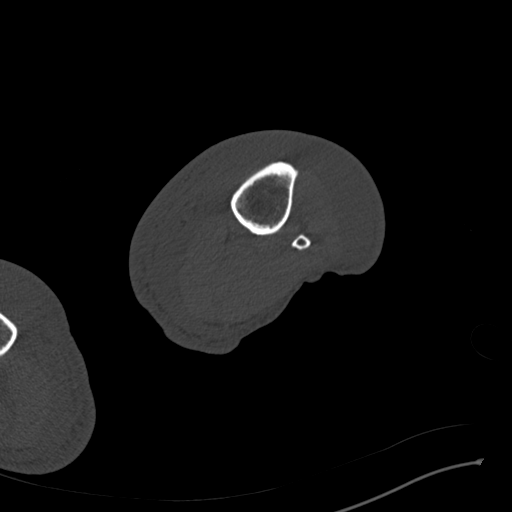
[im 253/274  bone]
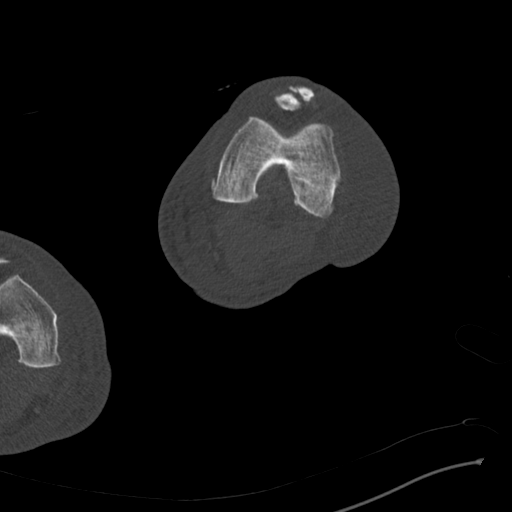

[Series 9: coronal st · coronal · 0.39mm/px · 3 of 97 slices shown]
[im 20/97  bone]
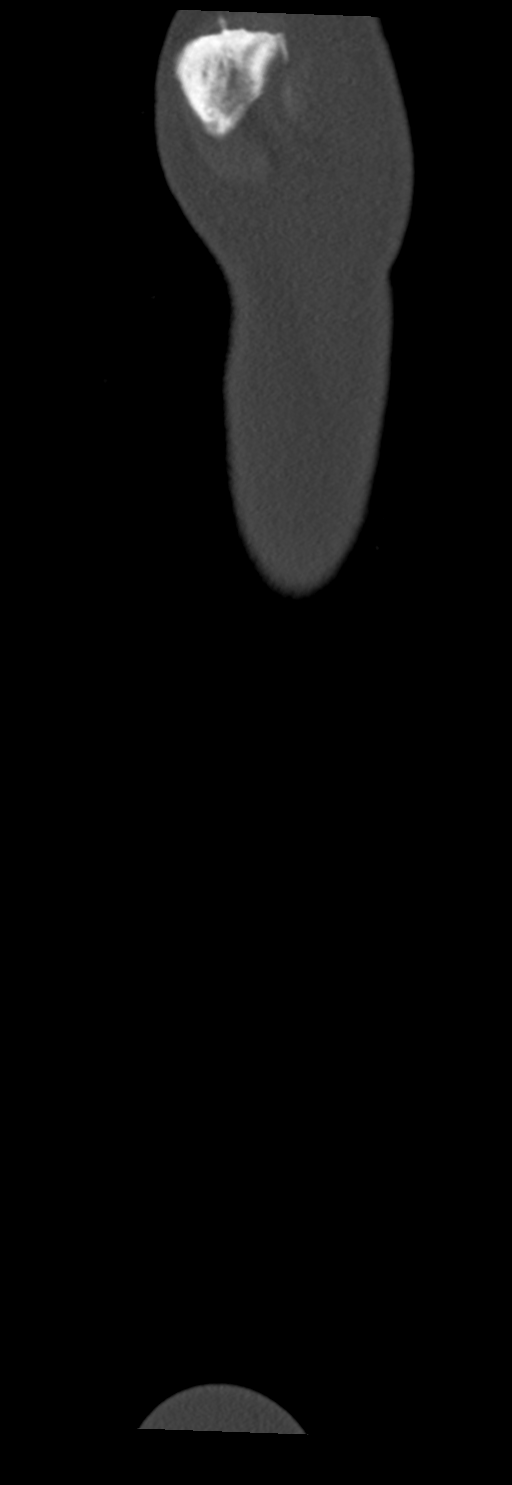
[im 39/97  bone]
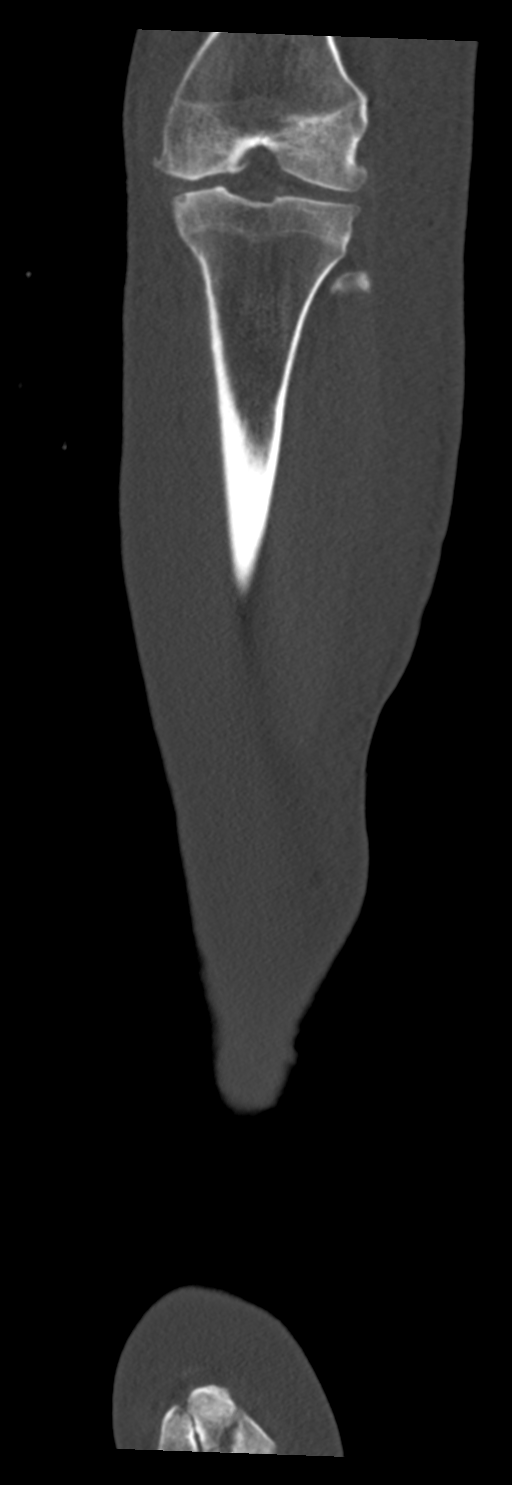
[im 58/97  bone]
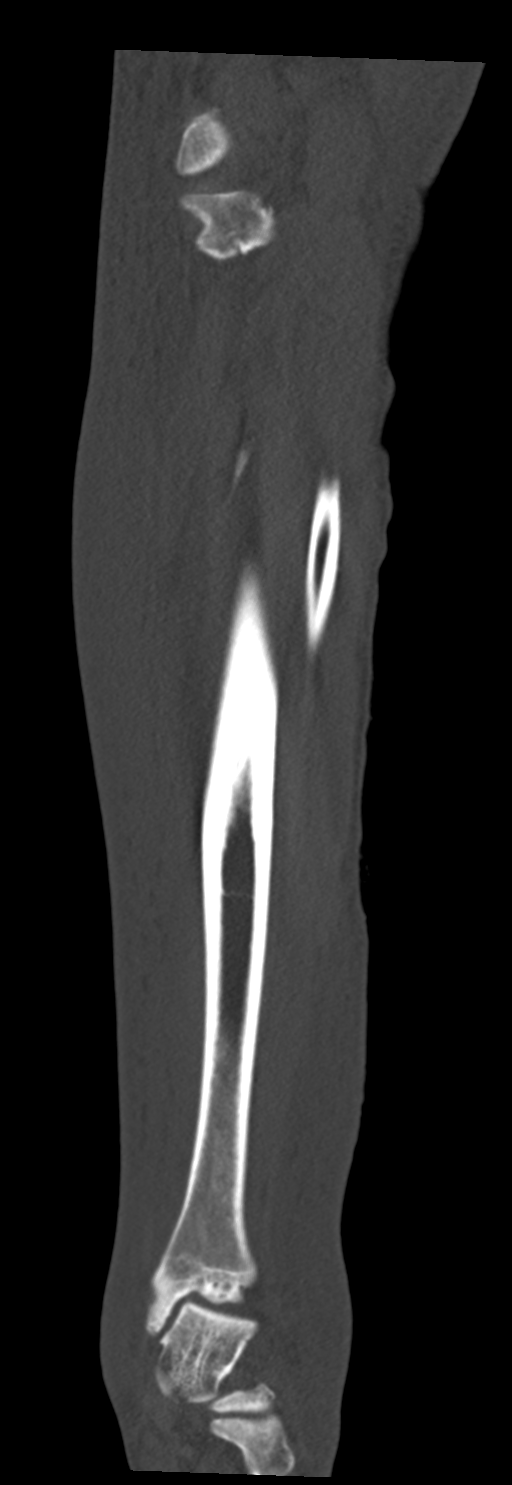

[11 of 20 positions shown; findings below may reference images not displayed]

FINDINGS: Bones/Joint/Cartilage

No fracture or dislocation. Normal alignment. No joint effusion.
Moderate medial femorotibial compartment joint space narrowing with
marginal osteophytes and subchondral sclerosis. Mild lateral
femorotibial compartment joint space narrowing with small marginal
osteophytes. Small patellofemoral compartment marginal osteophytes.
Subchondral cystic changes in the medial corner of the talar dome.
Small anterior tibial plafond marginal osteophyte.

No aggressive osseous lesion. No periosteal reaction or bone
destruction.

Ligaments

Ligaments are suboptimally evaluated by CT.

Muscles and Tendons
Muscles are normal.  No muscle atrophy.

Soft tissue
Severe soft tissue edema within the subcutaneous fat of the left
lower leg. Severe soft tissue edema within the subcutaneous fat of
the visualized portions of the right lower leg. No soft tissue mass.
IMPRESSION: 1. Severe soft tissue edema within the subcutaneous fat of the left
lower leg. Severe soft tissue edema within the subcutaneous fat of
the visualized portions of the right lower leg. Differential
considerations include severe cellulitis versus venous stasis.

## 2017-01-16 IMAGING — CT CT HEAD W/O CM
2 of 4 series · 15 of 47 positions shown, 18 images · non-contrast
Comparison: None.

CLINICAL DATA: Headache hypertension

EXAM:
CT HEAD WITHOUT CONTRAST
TECHNIQUE: Contiguous axial images were obtained from the base of the skull
through the vertex without intravenous contrast.

[Series 2: head wo · axial · 0.48mm/px · z∈[-120,+25]mm · 12 of 33 slices shown, 15 images]
[im 2/33  brain]
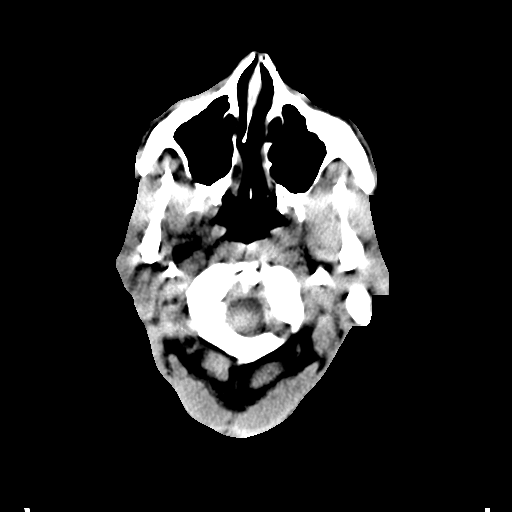
[im 2/33  bone]
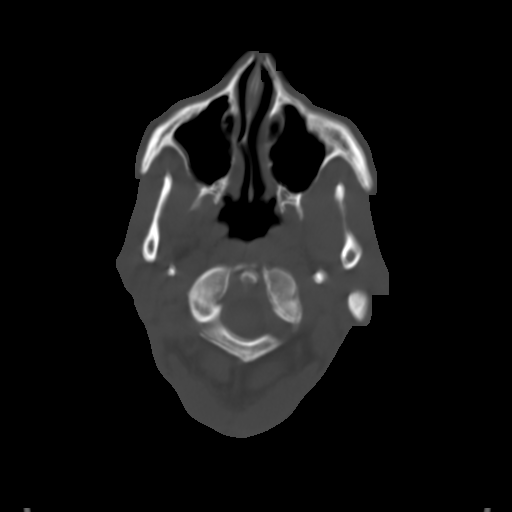
[im 5/33  brain]
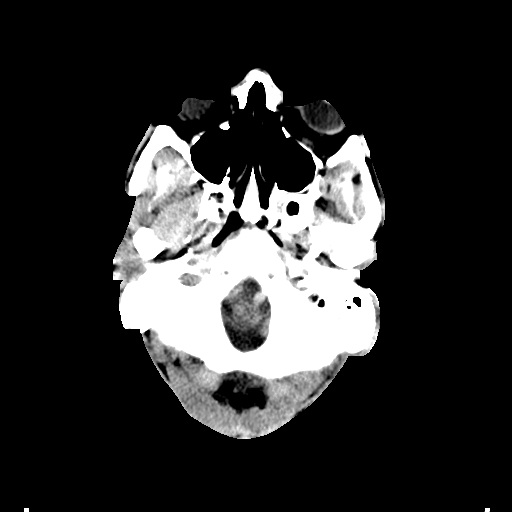
[im 7/33  brain]
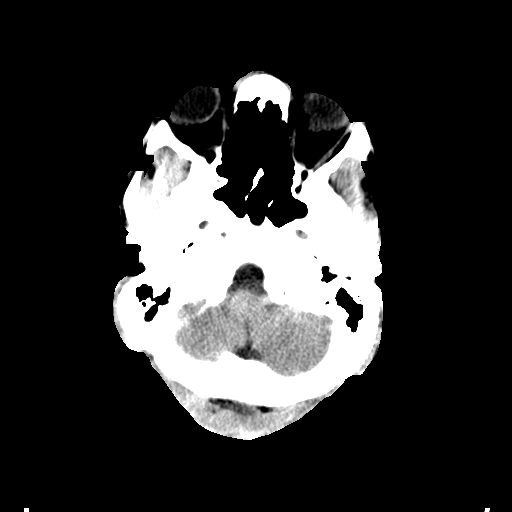
[im 10/33  brain]
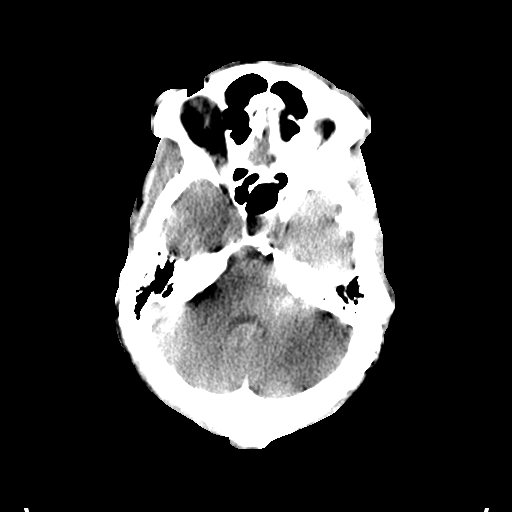
[im 13/33  brain]
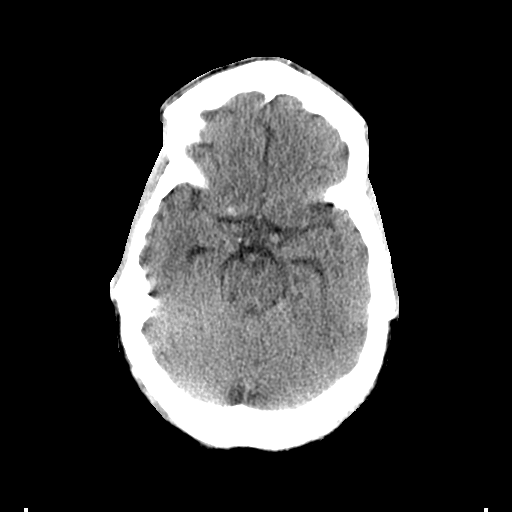
[im 13/33  bone]
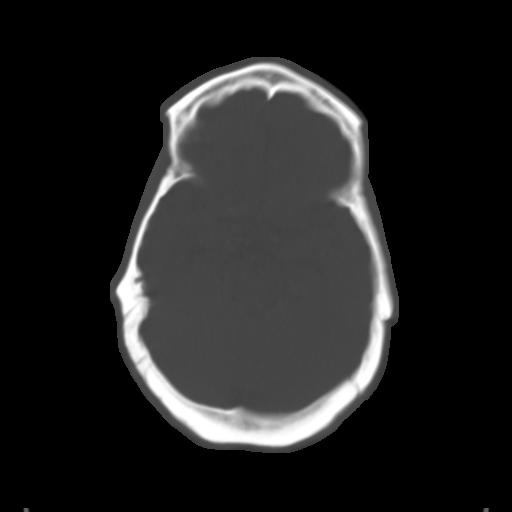
[im 15/33  brain]
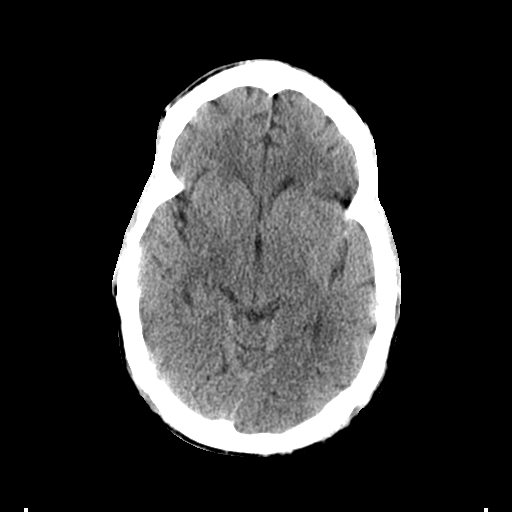
[im 18/33  brain]
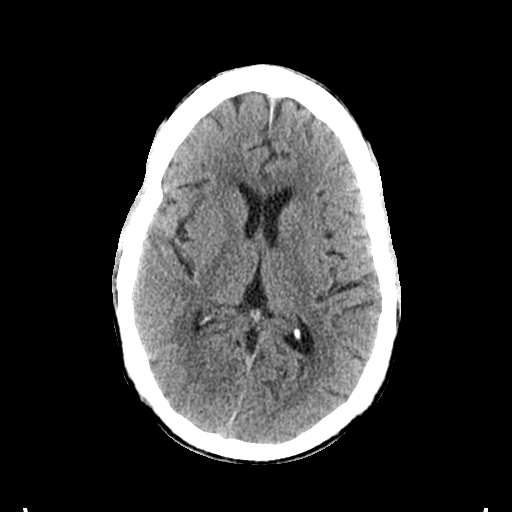
[im 20/33  brain]
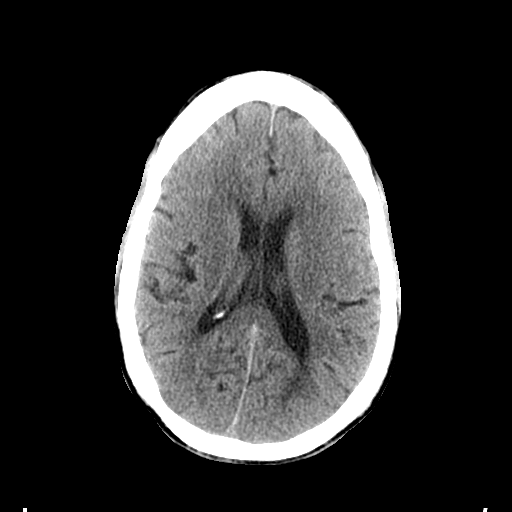
[im 23/33  brain]
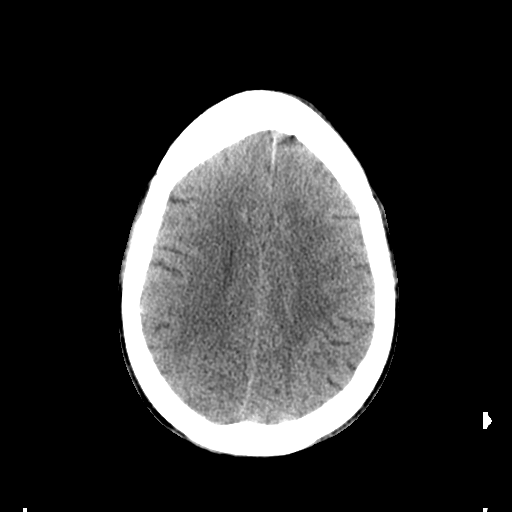
[im 23/33  bone]
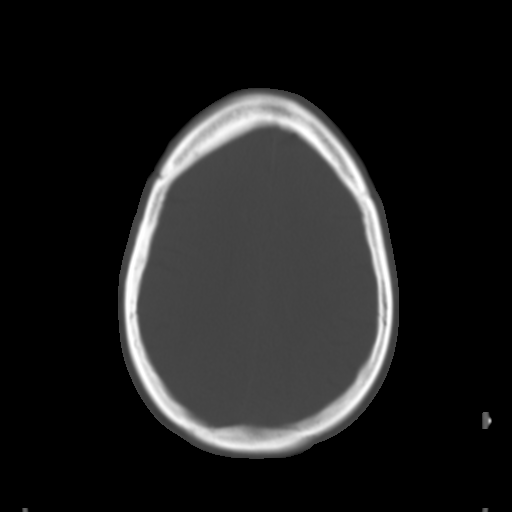
[im 26/33  brain]
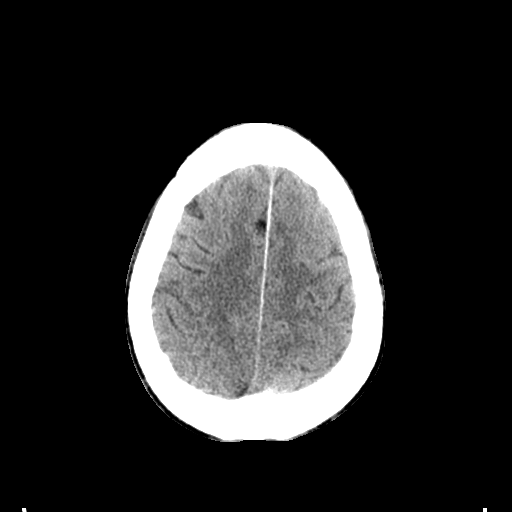
[im 28/33  brain]
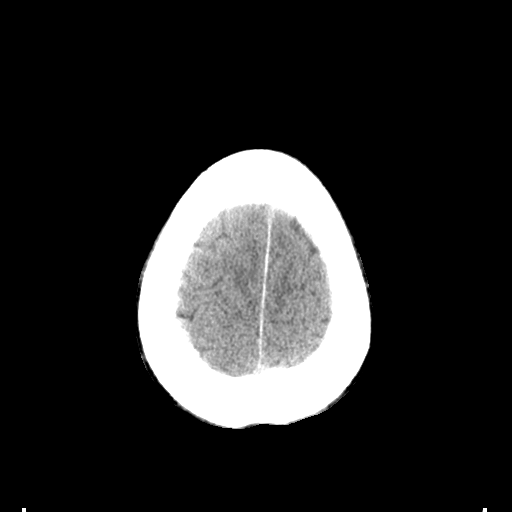
[im 31/33  brain]
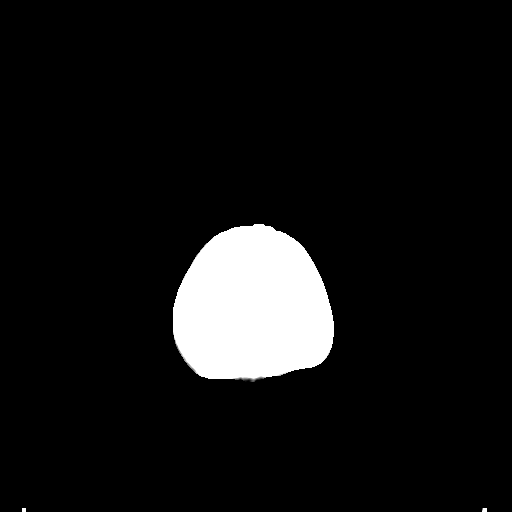

[Series 4: coronal soft tissue · coronal · 0.32mm/px · 3 of 84 slices shown]
[im 28/84  brain]
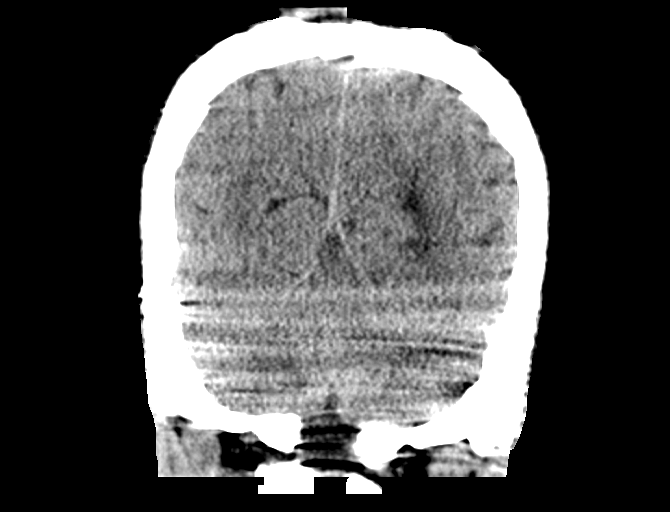
[im 37/84  brain]
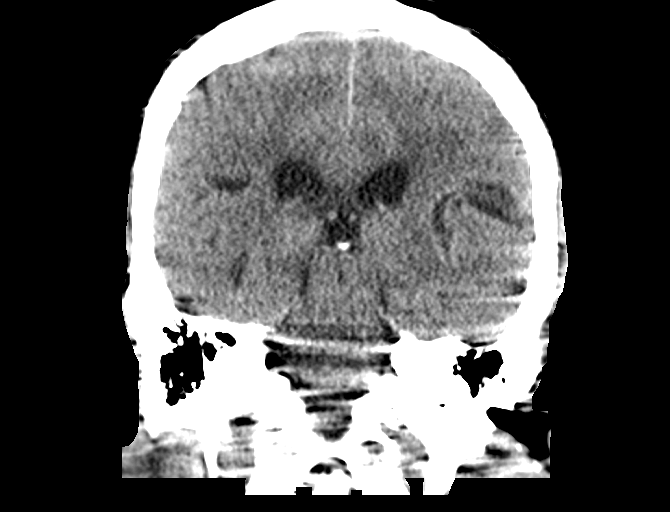
[im 47/84  brain]
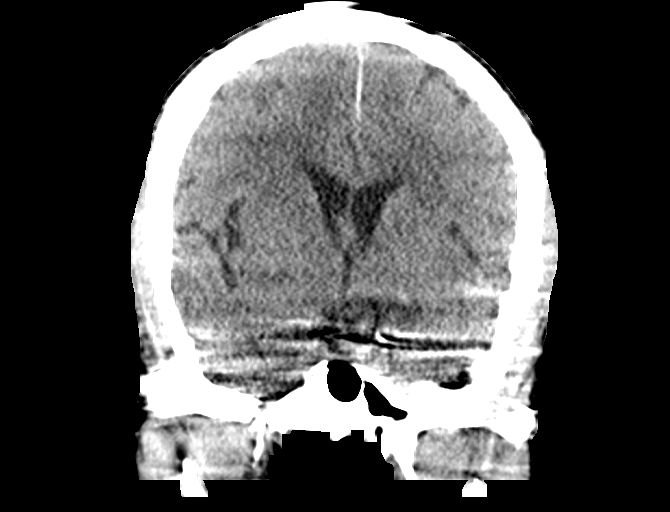

[15 of 47 positions shown; findings below may reference images not displayed]

FINDINGS: Motion degraded images limit assessment.

Brain: No evidence of acute large vascular territory infarction,
hemorrhage, hydrocephalus, extra-axial collection or mass
lesion/mass effect. No intraventricular hemorrhage. No effacement of
the basal cisterns or fourth ventricle. Hyperdensity along the falx
likely related to falcine calcifications, without eccentric or focal
thickening to suggest hemorrhage. Minimal periventricular white
matter hypodensities consistent with small vessel ischemic disease.

Vascular: No hyperdense vessel or unexpected calcification.

Skull: Negative for fracture or focal lesion.

Sinuses/Orbits: No acute finding.

Other: None
IMPRESSION: Limited by motion artifacts. Minimal small vessel ischemic disease.
No acute intracranial abnormality.

## 2017-01-16 IMAGING — CR DG CHEST 2V
2 series · 2 of 2 positions shown · non-contrast
Comparison: Non

CLINICAL DATA: Shortness of breath, BILATERAL lower extremity edema
with weeping wounds, on oxygen, history hypertension, former smoker

EXAM:
CHEST  2 VIEW

[w chest lat]
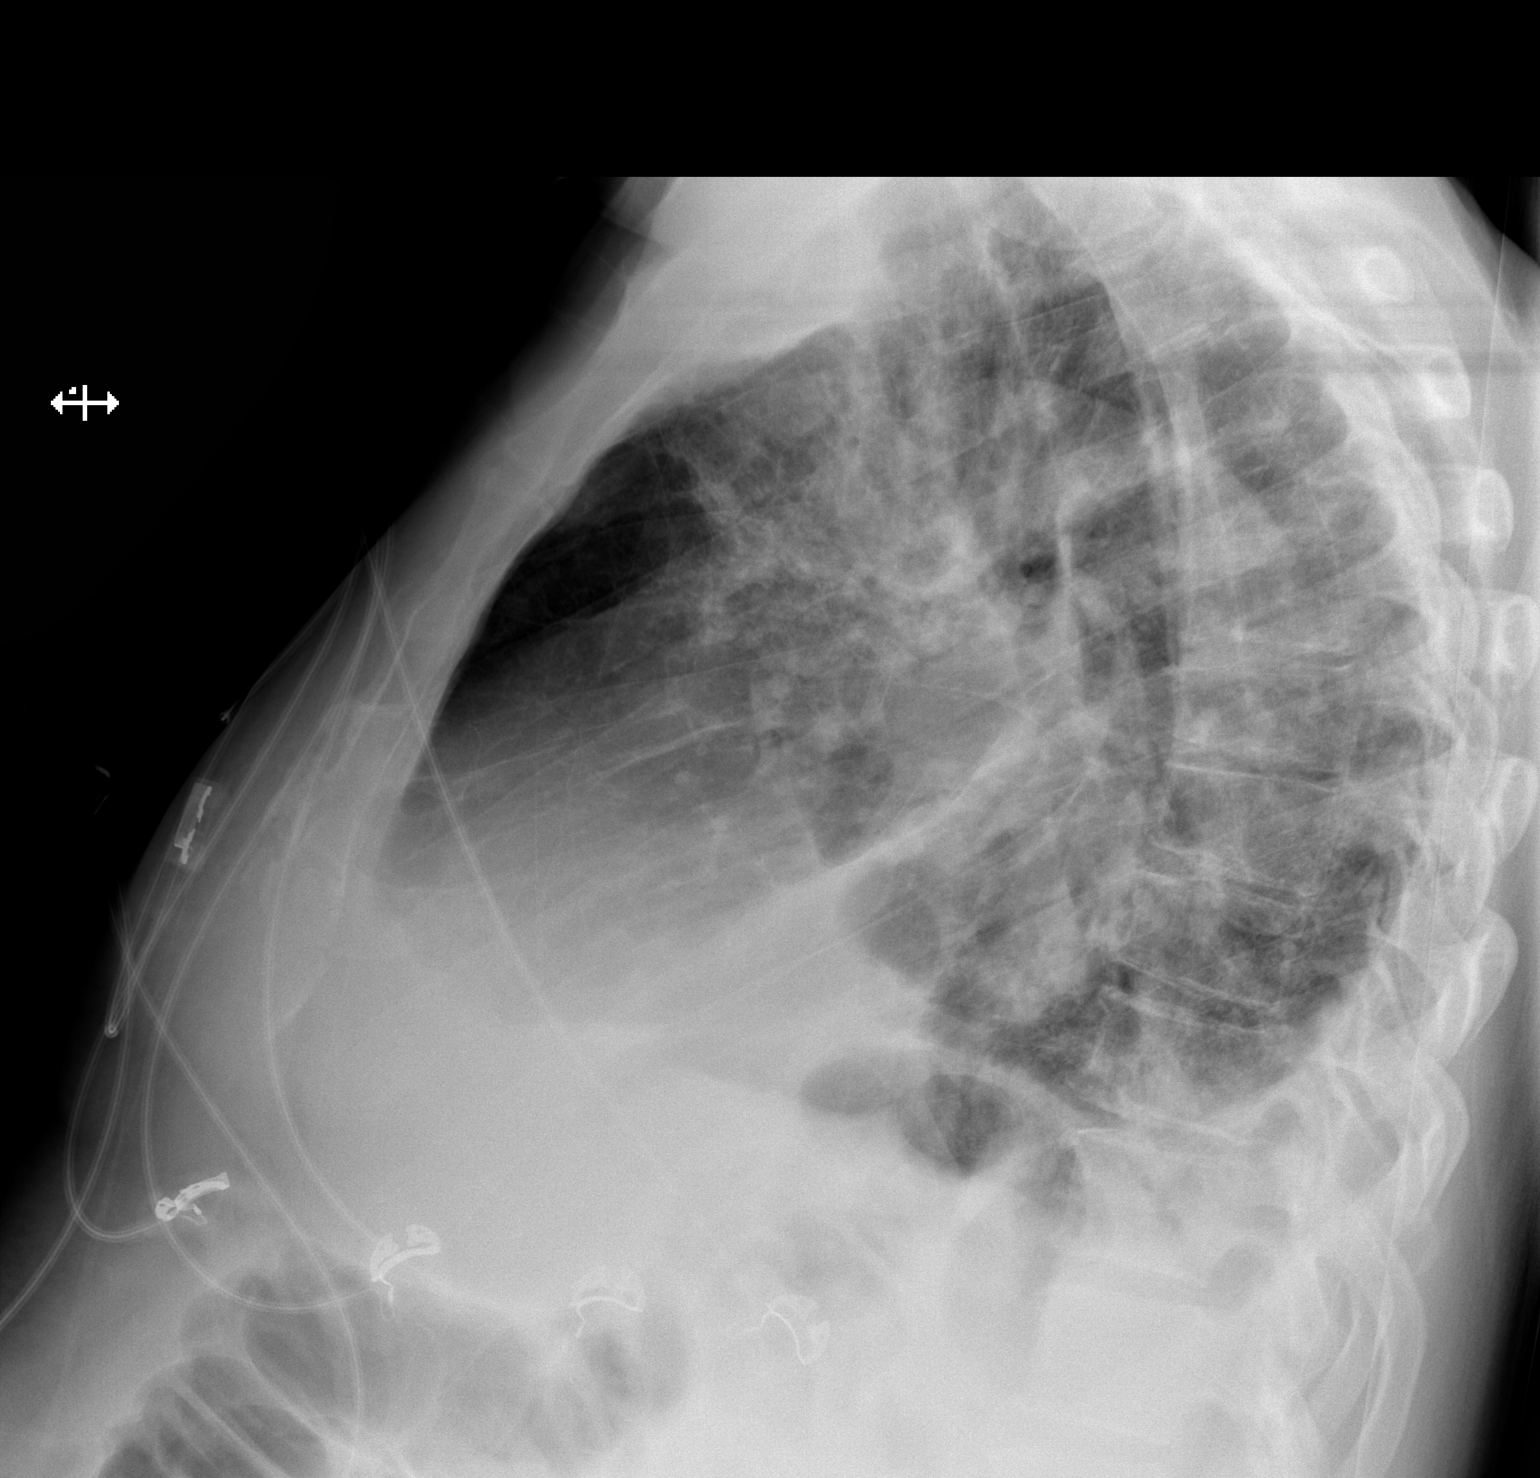

[x chest ap]
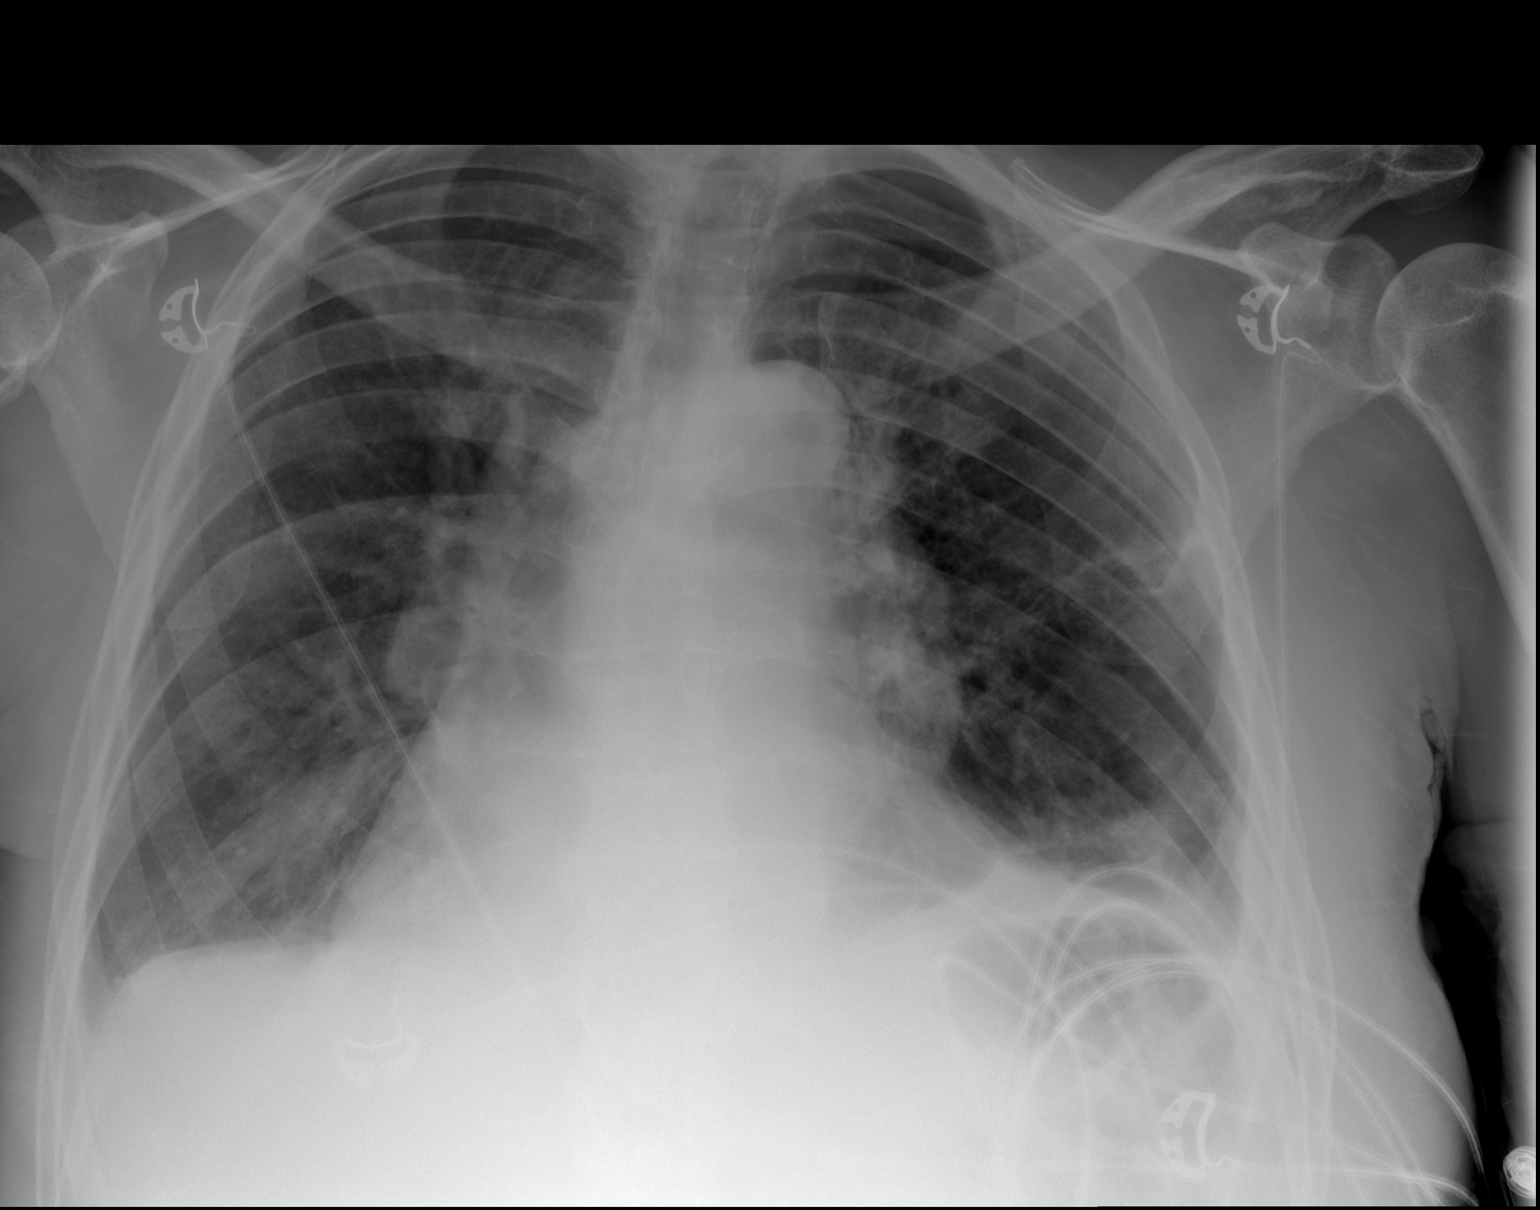

[2 of 2 positions shown; findings below may reference images not displayed]

FINDINGS: Enlargement of cardiac silhouette with pulmonary vascular
congestion.

Rotated to the RIGHT.

Subsegmental atelectasis LEFT mid lung and at both bases greater on
LEFT.

Minimal LEFT pleural effusion.

No definite acute infiltrate, pleural effusion or pneumothorax.
IMPRESSION: Enlargement of cardiac silhouette with vascular congestion.

Scattered atelectasis greater on LEFT with minimal LEFT pleural
effusion.

## 2017-01-16 MED ORDER — AMLODIPINE BESYLATE 10 MG PO TABS
10.0000 mg | ORAL_TABLET | Freq: Every day | ORAL | Status: DC
  Administered 2017-01-16 – 2017-01-25 (×7): 10 mg via ORAL
  Filled 2017-01-16 (×5): qty 1
  Filled 2017-01-16 (×2): qty 2
  Filled 2017-01-16 (×2): qty 1

## 2017-01-16 MED ORDER — CLONIDINE HCL 0.1 MG PO TABS
0.2000 mg | ORAL_TABLET | Freq: Once | ORAL | Status: AC
  Administered 2017-01-16: 0.2 mg via ORAL
  Filled 2017-01-16: qty 2

## 2017-01-16 MED ORDER — LABETALOL HCL 5 MG/ML IV SOLN
10.0000 mg | Freq: Once | INTRAVENOUS | Status: AC
  Administered 2017-01-16: 10 mg via INTRAVENOUS
  Filled 2017-01-16: qty 4

## 2017-01-16 MED ORDER — LABETALOL HCL 5 MG/ML IV SOLN: 10.0000 mg | INTRAVENOUS | Status: DC | PRN

## 2017-01-16 MED ORDER — LABETALOL HCL 5 MG/ML IV SOLN
10.0000 mg | INTRAVENOUS | Status: DC | PRN
  Administered 2017-01-16 – 2017-01-17 (×3): 10 mg via INTRAVENOUS
  Filled 2017-01-16 (×2): qty 4

## 2017-01-16 MED ORDER — FUROSEMIDE 10 MG/ML IJ SOLN
160.0000 mg | Freq: Four times a day (QID) | INTRAVENOUS | Status: DC
  Administered 2017-01-16 – 2017-01-18 (×7): 160 mg via INTRAVENOUS
  Filled 2017-01-16 (×9): qty 16
  Filled 2017-01-16: qty 10
  Filled 2017-01-16: qty 16
  Filled 2017-01-16: qty 4
  Filled 2017-01-16: qty 16

## 2017-01-16 MED ORDER — LABETALOL HCL 5 MG/ML IV SOLN: 10.0000 mg | Freq: Once | INTRAVENOUS | Status: DC

## 2017-01-16 MED ORDER — VANCOMYCIN HCL IN DEXTROSE 1-5 GM/200ML-% IV SOLN: 1000.0000 mg | Freq: Once | INTRAVENOUS | Status: DC

## 2017-01-16 MED ORDER — NITROGLYCERIN IN D5W 200-5 MCG/ML-% IV SOLN
0.0000 ug/min | INTRAVENOUS | Status: DC
  Administered 2017-01-16: 15 ug/min via INTRAVENOUS
  Filled 2017-01-16 (×2): qty 250

## 2017-01-16 MED ORDER — FUROSEMIDE 10 MG/ML IJ SOLN
40.0000 mg | Freq: Once | INTRAMUSCULAR | Status: DC
  Filled 2017-01-16: qty 4

## 2017-01-16 MED ORDER — VANCOMYCIN HCL 10 G IV SOLR
2000.0000 mg | Freq: Once | INTRAVENOUS | Status: AC
  Administered 2017-01-16: 2000 mg via INTRAVENOUS
  Filled 2017-01-16: qty 2000

## 2017-01-16 MED ORDER — HYDRALAZINE HCL 50 MG PO TABS
100.0000 mg | ORAL_TABLET | Freq: Three times a day (TID) | ORAL | Status: DC
  Administered 2017-01-16 – 2017-01-23 (×17): 100 mg via ORAL
  Filled 2017-01-16 (×21): qty 2

## 2017-01-16 MED ORDER — FUROSEMIDE 10 MG/ML IJ SOLN
40.0000 mg | Freq: Once | INTRAMUSCULAR | Status: AC
  Administered 2017-01-16: 40 mg via INTRAVENOUS

## 2017-01-16 MED ORDER — LABETALOL HCL 5 MG/ML IV SOLN
0.5000 mg/min | INTRAVENOUS | Status: DC
  Filled 2017-01-16: qty 100

## 2017-01-16 MED ORDER — CARVEDILOL 25 MG PO TABS
25.0000 mg | ORAL_TABLET | Freq: Two times a day (BID) | ORAL | Status: DC
  Administered 2017-01-16 – 2017-01-26 (×16): 25 mg via ORAL
  Filled 2017-01-16 (×18): qty 1

## 2017-01-16 NOTE — ED Notes (Signed)
Nephrology at bedside

## 2017-01-16 NOTE — ED Notes (Signed)
Dr. Florene Glen, the admitting doctor at bedside.

## 2017-01-16 NOTE — ED Provider Notes (Signed)
[facility] DEPT Provider Note   CSN: [id number] Arrival date & time: 01/16/17  1203     History   Chief Complaint Chief Complaint  Patient presents with  . Leg Swelling    HPI Jason [name] is a 57 y.o. male with history of hypertension and CKD presents today with chief complaint acute onset, progressively worsening bilateral lower extremity edema and shortness of breath for 2 weeks.  He notes that his legs are painful and weeping clear and white fluid.  He endorses orthopnea, DOE, and PND.  He denies chest pain.  No fevers or chills.  States he has not been taking his hypertension medications for several months.  He states this is happened once before.  In January 2017 he was admitted for AKI and malignant hypertension and was able to follow-up with a primary care physician 1 month later but states he was unable to afford any follow-up appointments.  Denies fever, chills, abdominal pain, vomiting, or changes to bowel or bladder.  He does endorse nausea.  States that he tried to "walk off "his symptoms without significant improvement.  He was also put on 2 L via nasal cannula by EMS due to his shortness of breath which he states was helpful.  He does not have a home O2 requirement.  The history is provided by the patient.    Past Medical History:  Diagnosis Date  . AKI (acute kidney injury) ([place]) 01/2015  . Hypertension     Patient Active Problem List   Diagnosis Date Noted  . Chronic kidney disease 02/27/2015  . Essential hypertension 02/26/2015  . Right inguinal hernia 02/26/2015  . AKI (acute kidney injury) ([place])   . Acute upper respiratory infection 01/30/2015  . Abnormal EKG 01/30/2015  . Acute kidney injury ([place]) 01/29/2015  . Anemia 01/29/2015    Past Surgical History:  Procedure Laterality Date  . TOE SURGERY    . TONSILLECTOMY         Home Medications    Prior to Admission medications   Medication Sig Start Date End Date Taking?  Authorizing Provider  Multiple Vitamin (MULTIVITAMIN WITH MINERALS) TABS tablet Take 1 tablet by mouth daily.   Yes [provider]  amLODipine (NORVASC) 10 MG tablet Take 1 tablet (10 mg total) by mouth at bedtime. Patient not taking: Reported on 01/16/2017 02/22/16   [name], MD  carvedilol (COREG) 6.25 MG tablet Take 1 tablet (6.25 mg total) by mouth 2 (two) times daily with a meal. Patient not taking: Reported on 01/16/2017 04/04/16   [name], PA-C  cloNIDine (CATAPRES) 0.2 MG tablet Take 1 tablet (0.2 mg total) by mouth 2 (two) times daily. Patient not taking: Reported on 01/16/2017 05/03/16   [name], MD  hydrALAZINE (APRESOLINE) 100 MG tablet Take 1 tablet (100 mg total) by mouth every 8 (eight) hours. Patient not taking: Reported on 01/16/2017 02/22/16   [name], MD  LORazepam (ATIVAN) 1 MG tablet Take 1 tablet (1 mg total) by mouth every 8 (eight) hours as needed for anxiety. Patient not taking: Reported on 01/16/2017 05/03/16   [name], MD    Family History Family History  Problem Relation Age of Onset  . Heart attack Mother 58       CABG hx  . Heart disease Mother   . Hypertension Mother   . Healthy Father   . Healthy Brother     Social History Social History   Tobacco Use  .  Smoking status: Former Research scientist (life sciences)  . Smokeless tobacco: Never Used  . Tobacco comment: quit smoking in 1997  Substance Use Topics  . Alcohol use: No  . Drug use: No     Allergies   Lisinopril   Review of Systems Review of Systems  Constitutional: Negative for chills and fever.  Respiratory: Positive for shortness of breath.   Cardiovascular: Positive for leg swelling. Negative for chest pain.  Gastrointestinal: Positive for nausea. Negative for abdominal pain and vomiting.  Genitourinary: Negative for decreased urine volume.  Neurological: Negative for headaches.  All other systems reviewed and are negative.    Physical Exam Updated  Vital Signs BP (!) 223/176   Pulse 97   Resp (!) 26   SpO2 98%   Physical Exam  Constitutional: He appears well-developed and well-nourished. No distress.  HENT:  Head: Normocephalic and atraumatic.  Eyes: Conjunctivae are normal. Right eye exhibits no discharge. Left eye exhibits no discharge.  Neck: Normal range of motion. Neck supple. No JVD present. No tracheal deviation present.  Cardiovascular: Normal rate and regular rhythm.  Pitting edema of the bilateral lower extremities, weeping clear and milky fluid, see attached images.  There appears to be a superficial blister of the anterior left lower extremity with some surrounding erythema and warmth.  DP/PT pulses are difficult to palpate but are present using Doppler  Pulmonary/Chest: He has rales. He exhibits no tenderness.  Equal rise and fall of chest, tachypneic at 28 respirations per minute, SPO2 90% on room air, improves to 96% on 4 L via nasal cannula,  Abdominal: Soft. Bowel sounds are normal. He exhibits no distension. There is no tenderness.  Musculoskeletal: He exhibits edema.  Neurological: He is alert.  Fluent speech, no facial droop, answers questions appropriately and follows commands without difficulty.  Able to provide a detailed history  Skin: Skin is warm and dry. There is erythema.  Psychiatric: He has a normal mood and affect. His behavior is normal.  Nursing note and vitals reviewed.      ED Treatments / Results  Labs (all labs ordered are listed, but only abnormal results are displayed) Labs Reviewed  BASIC METABOLIC PANEL - Abnormal; Notable for the following components:      Result Value   Potassium 5.4 (*)    CO2 21 (*)    Glucose, Bld 105 (*)    BUN 118 (*)    Creatinine, Ser 6.98 (*)    Calcium 8.8 (*)    GFR calc non Af Amer 8 (*)    GFR calc Af Amer 9 (*)    All other components within normal limits  BRAIN NATRIURETIC PEPTIDE - Abnormal; Notable for the following components:   B  Natriuretic Peptide >4,500.0 (*)    All other components within normal limits  CBC WITH DIFFERENTIAL/PLATELET - Abnormal; Notable for the following components:   Lymphs Abs 0.4 (*)    All other components within normal limits  HEPATIC FUNCTION PANEL - Abnormal; Notable for the following components:   Albumin 2.8 (*)    ALT 64 (*)    All other components within normal limits  URINALYSIS, ROUTINE W REFLEX MICROSCOPIC - Abnormal; Notable for the following components:   Hgb urine dipstick SMALL (*)    Protein, ur 100 (*)    Bacteria, UA FEW (*)    All other components within normal limits  BLOOD GAS, ARTERIAL - Abnormal; Notable for the following components:   pH, Arterial 7.328 (*)  Bicarbonate 19.8 (*)    Acid-base deficit 5.2 (*)    All other components within normal limits  I-STAT TROPONIN, ED - Abnormal; Notable for the following components:   Troponin i, poc 0.43 (*)    All other components within normal limits  I-STAT CHEM 8, ED - Abnormal; Notable for the following components:   Potassium 5.3 (*)    BUN 94 (*)    Creatinine, Ser 7.00 (*)    Glucose, Bld 101 (*)    Calcium, Ion 1.13 (*)    All other components within normal limits    EKG  EKG Interpretation  Date/Time:  Sunday January 16 2017 12:50:39 EST Ventricular Rate:  98 PR Interval:    QRS Duration: 98 QT Interval:  387 QTC Calculation: 495 R Axis:   -5 Text Interpretation:  Sinus rhythm Borderline T wave abnormalities , new since prior tracing Borderline prolonged QT interval qrs voltage decreased since prior tracing Confirmed by Dorie Rank [phone]) on 01/16/2017 1:31:37 PM       Radiology Dg Chest 2 View  Result Date: 01/16/2017 CLINICAL DATA:  Shortness of breath, BILATERAL lower extremity edema with weeping wounds, on oxygen, history hypertension, former smoker EXAM: CHEST  2 VIEW COMPARISON:  Non FINDINGS: Enlargement of cardiac silhouette with pulmonary vascular congestion. Rotated to the RIGHT.  Subsegmental atelectasis LEFT mid lung and at both bases greater on LEFT. Minimal LEFT pleural effusion. No definite acute infiltrate, pleural effusion or pneumothorax. IMPRESSION: Enlargement of cardiac silhouette with vascular congestion. Scattered atelectasis greater on LEFT with minimal LEFT pleural effusion. Electronically Signed   By: Lavonia Dana M.D.   On: 01/16/2017 13:09    Procedures Procedures (including critical care time)  Medications Ordered in ED Medications  nitroGLYCERIN 50 mg in dextrose 5 % 250 mL (0.2 mg/mL) infusion (30 mcg/min Intravenous Rate/Dose Change 01/16/17 1501)  furosemide (LASIX) injection 40 mg (40 mg Intravenous Given 01/16/17 1407)     Initial Impression / Assessment and Plan / ED Course  I have reviewed the triage vital signs and the nursing notes.  Pertinent labs & imaging results that were available during my care of the patient were reviewed by me and considered in my medical decision making (see chart for details).  Clinical Course as of Jan 18 1043  Sun Jan 16, 2017  1602 Care handoff from [place], [place]:  [facility] Dr. Florene Glen ordering CT head (altered, hypertensive emergency); will admit if systolic BP <638 vs ICU (nitro drip) New onset CHF; acute renal failure Cr 7, Dr. Joelyn Oms w nephrology recommends transfer to [facility] for [facility]; tachypneic on O2  [JR]  1949 BP improved to 453M systolic. CT head neg.  Dr. Florene Glen accepting admission to step-down  [JR]  2049 Pt waiting for bed at [facility]  [JR]    Clinical Course User Index [JR] Robinson, [place] N, PA-C    Patient presents with acute onset, worsening bilateral lower extremity edema shortness of breath.  Appears to be new onset CHF.  He is tachypneic and requiring oxygen via nasal cannula while in the ED.  SPO2 saturations dropped to 88-90% on room air without oxygen.  Chest x-ray shows enlargement of the cardiac silhouette with pulmonary vascular congestion, subsegmental atelectasis of multiple lobes, left  pleural effusion.  He has an extremely elevated BNP as well as a positive troponin likely due to demand ischemia.  No leukocytosis.  He does have a creatinine of 6.98, significantly elevated compared to lab work from 04/04/16.  He has  been persistently hypertensive while in the ED and was placed on a nitroglycerin drip without significant improvement.  He was also given 1 dose of IV Lasix for his volume overload.  Lower extremity edema is consistent with volume overload, less concern for cellulitis or DVT although either are possible.  3:15 PM Spoke with Dr. Joelyn Oms with nephrology who agrees to consult and follow patient while he is in the hospital.  3:22 PM Spoke with Dr. Florene Glen with [facility] hospitalist service who agrees to assume care of patient and bring him into the hospital for further evaluation and management. He recommends labetalol and home medications to attempt to lower patient's blood pressure given he has not yet responded to nitro drip.  Patient seen and evaluated by Dr. Tomi Bamberger who agrees with assessment and plan at this time.  4:23 PM Dr. Florene Glen has seen and evaluated patient.  Mild improvement in BP thus far with nitroglycerin drip.  If patient continues to require titration of nitroglycerin drip he may require admission into the ICU.  Dr. Florene Glen states that he will obtain imaging of the head as the patient appears somewhat acutely confused as well as imaging of the left lower extremity to evaluate for cellulitis.  He also recommends hydralazine.  He states the patient will be stable for admission into a stepdown unit bed if his blood pressure improves to less than 756E systolic. Signed out to PA [place], who is aware of patient's blood pressure and will follow up with Dr. Florene Glen and possibly the critical care team if necessary.   CRITICAL CARE Performed by: Renita Papa   Total critical care time: 50 minutes  Critical care time was exclusive of separately billable procedures and  treating other patients.  Critical care was necessary to treat or prevent imminent or life-threatening deterioration.  Critical care was time spent personally by me on the following activities: development of treatment plan with patient and/or surrogate as well as nursing, discussions with consultants, evaluation of patient's response to treatment, examination of patient, obtaining history from patient or surrogate, ordering and performing treatments and interventions, ordering and review of laboratory studies, ordering and review of radiographic studies, pulse oximetry and re-evaluation of patient's condition.     Final Clinical Impressions(s) / ED Diagnoses   Final diagnoses:  Acute renal failure, unspecified acute renal failure type [facility])  Other hypervolemia    ED Discharge Orders    None       Renita Papa, PA-C 01/17/17 1049    Dorie Rank, MD 01/17/17 1515

## 2017-01-16 NOTE — ED Triage Notes (Addendum)
Per EMS, pt from [facility].  Pt c/o edema BLE with weeping and wounds present.  Swelling x 2 weeks.  HTN.  Not taking his meds.  Vitals:  240/162, hr 103, resp 18, 97% 2L [place], CBG 112

## 2017-01-16 NOTE — ED Notes (Signed)
Pt transported to CT ?

## 2017-01-16 NOTE — ED Notes (Signed)
Date and time results received: 01/16/17 2059 (use smartphrase ".now" to insert current time)  Test: Troponin Critical Value: 0.37  Name of Provider Notified: Dr.Powell  Orders Received? Or Actions Taken?

## 2017-01-16 NOTE — H&P (Signed)
History and Physical    Jason [name] AYT:[id number] DOB: 05/20/1960 DOA: 01/16/2017  PCP: Dorena Dew, FNP  Patient coming from: home  I have personally briefly reviewed patient's old medical records in [place]  Chief Complaint: I had to get checked out  HPI: Jason [name] is Jason [name] 57 y.o. male with medical history significant of hypertension, chronic kidney disease, anemia, inguinal hernia presenting with worsening shortness of breath, lower extremity edema, and altered mental status.  History is limited due to patient's mental status.  He notes that he had to get checked out, but can't clearly say why he's here.  Denies CP, HA, vision changes, abdominal pain.  He notes SOB and lower extremity swelling that is worse.  He can't say over what time these changes have occurred.  He notes that he still makes urine.  No numbness, tingling, or weakness anywhere.  He has note been taking his meds for months.  I spoke to his daughter who noted that he was not as confused Jason [name] few days ago.  He has an infection on his L leg that he can't tell me how long it's been there. They've been telling him to get checked out for Jason [name] while for his blood pressure and swelling.  Per EDP note, sob and lee have been worsening over 2 weeks.      ED Course: Received lasix and placed on nitro gtt.  CXR, EKG, basic labs.  Renal consult.  Admit for hypertensive emergency and renal failure.    Review of Systems: As per HPI otherwise 10 point review of systems negative (limited due to altered mental status above)  Past Medical History:  Diagnosis Date  . AKI (acute kidney injury) ([place]) 01/2015  . Hypertension     Past Surgical History:  Procedure Laterality Date  . TOE SURGERY    . TONSILLECTOMY       reports that he has quit smoking. he has never used smokeless tobacco. He reports that he does not drink alcohol or use drugs.  Allergies  Allergen Reactions  . Lisinopril Cough    Family History  Problem  Relation Age of Onset  . Heart attack Mother 14       CABG hx  . Heart disease Mother   . Hypertension Mother   . Healthy Father   . Healthy Brother    Prior to Admission medications   Medication Sig Start Date End Date Taking? Authorizing Provider  Multiple Vitamin (MULTIVITAMIN WITH MINERALS) TABS tablet Take 1 tablet by mouth daily.   Yes [provider]  amLODipine (NORVASC) 10 MG tablet Take 1 tablet (10 mg total) by mouth at bedtime. Patient not taking: Reported on 01/16/2017 02/22/16   [name], MD  carvedilol (COREG) 6.25 MG tablet Take 1 tablet (6.25 mg total) by mouth 2 (two) times daily with [name] meal. Patient not taking: Reported on 01/16/2017 04/04/16   [name], PA-C  cloNIDine (CATAPRES) 0.2 MG tablet Take 1 tablet (0.2 mg total) by mouth 2 (two) times daily. Patient not taking: Reported on 01/16/2017 05/03/16   [name], MD  hydrALAZINE (APRESOLINE) 100 MG tablet Take 1 tablet (100 mg total) by mouth every 8 (eight) hours. Patient not taking: Reported on 01/16/2017 02/22/16   [name], MD  LORazepam (ATIVAN) 1 MG tablet Take 1 tablet (1 mg total) by mouth every 8 (eight) hours as needed for anxiety. Patient not taking: Reported on 01/16/2017 05/03/16   [name], MD  Physical Exam: Vitals:   01/16/17 1548 01/16/17 1601 01/16/17 1630 01/16/17 1800  BP: (!) 224/178 (!) 205/163 (!) 206/159 (!) 214/161  Pulse: 99 81 84 88  Resp: (!) 26 (!) 24 (!) 23 (!) 23  Temp: 97.6 F (36.4 C)     TempSrc: Oral     SpO2: 99% 100% 100% 100%    Constitutional: NAD, calm, comfortable Vitals:   01/16/17 1548 01/16/17 1601 01/16/17 1630 01/16/17 1800  BP: (!) 224/178 (!) 205/163 (!) 206/159 (!) 214/161  Pulse: 99 81 84 88  Resp: (!) 26 (!) 24 (!) 23 (!) 23  Temp: 97.6 F (36.4 C)     TempSrc: Oral     SpO2: 99% 100% 100% 100%   Eyes: PERRL, lids and conjunctivae normal ENMT: Mucous membranes are moist. Posterior pharynx clear of any  exudate or lesions.Normal dentition.  Neck: normal, supple, no masses, no thyromegaly Respiratory: increased work of breathing, crackles at bases Cardiovascular: Regular rate and rhythm, no murmurs / rubs / gallops.  Palpable pulses. 3 + LEE Abdomen: no tenderness, no masses palpated. No hepatosplenomegaly. Bowel sounds positive.  Musculoskeletal: no clubbing / cyanosis. No joint deformity upper and lower extremities. Good ROM, no contractures. Normal muscle tone.  Skin: See EDP note for image of LLE cellulitis.  Erythema and swelling with white plaque and drainage to LLE.  Neurologic: CN 2-12 intact. Sensation intact. Strength 5/5 in all 4.  Sleepy, falling asleep frequently.  Jason [name]&Ox2 (knew [facility], but not [facility], didn't know month) Psychiatric: Normal judgment and insight. Alert and oriented x 3. Normal mood.   Labs on Admission: I have personally reviewed following labs and imaging studies  CBC: Recent Labs  Lab 01/16/17 1249 01/16/17 1316  WBC 6.0  --   NEUTROABS 5.1  --   HGB 14.1 14.3  HCT 39.8 42.0  MCV 85.4  --   PLT 370  --    Basic Metabolic Panel: Recent Labs  Lab 01/16/17 1249 01/16/17 1314 01/16/17 1316  NA 135  --  136  K 5.4*  --  5.3*  CL 102  --  102  CO2 21*  --   --   GLUCOSE 105*  --  101*  BUN 118*  --  94*  CREATININE 6.98*  --  7.00*  CALCIUM 8.8*  --   --   PHOS  --  7.9*  --    GFR: CrCl cannot be calculated (Unknown ideal weight.). Liver Function Tests: Recent Labs  Lab 01/16/17 1314  AST 24  ALT 64*  ALKPHOS 48  BILITOT 1.0  PROT 6.6  ALBUMIN 2.8*   No results for input(s): LIPASE, AMYLASE in the last 168 hours. No results for input(s): AMMONIA in the last 168 hours. Coagulation Profile: No results for input(s): INR, PROTIME in the last 168 hours. Cardiac Enzymes: No results for input(s): CKTOTAL, CKMB, CKMBINDEX, TROPONINI in the last 168 hours. BNP (last 3 results) No results for input(s): PROBNP in the last 8760 hours. HbA1C: No  results for input(s): HGBA1C in the last 72 hours. CBG: No results for input(s): GLUCAP in the last 168 hours. Lipid Profile: No results for input(s): CHOL, HDL, LDLCALC, TRIG, CHOLHDL, LDLDIRECT in the last 72 hours. Thyroid Function Tests: No results for input(s): TSH, T4TOTAL, FREET4, T3FREE, THYROIDAB in the last 72 hours. Anemia Panel: No results for input(s): VITAMINB12, FOLATE, FERRITIN, TIBC, IRON, RETICCTPCT in the last 72 hours. Urine analysis:    Component Value Date/Time   COLORURINE YELLOW  01/16/2017 [place] 01/16/2017 1420   LABSPEC 1.013 01/16/2017 1420   PHURINE 5.0 01/16/2017 1420   GLUCOSEU NEGATIVE 01/16/2017 1420   HGBUR SMALL (Andrews Tener) 01/16/2017 1420   BILIRUBINUR NEGATIVE 01/16/2017 1420   KETONESUR NEGATIVE 01/16/2017 1420   PROTEINUR 100 (Yusuke Beza) 01/16/2017 1420   UROBILINOGEN 0.2 02/26/2015 1028   NITRITE NEGATIVE 01/16/2017 1420   LEUKOCYTESUR NEGATIVE 01/16/2017 1420    Radiological Exams on Admission: Dg Chest 2 View  Result Date: 01/16/2017 CLINICAL DATA:  Shortness of breath, BILATERAL lower extremity edema with weeping wounds, on oxygen, history hypertension, former smoker EXAM: CHEST  2 VIEW COMPARISON:  Non FINDINGS: Enlargement of cardiac silhouette with pulmonary vascular congestion. Rotated to the RIGHT. Subsegmental atelectasis LEFT mid lung and at both bases greater on LEFT. Minimal LEFT pleural effusion. No definite acute infiltrate, pleural effusion or pneumothorax. IMPRESSION: Enlargement of cardiac silhouette with vascular congestion. Scattered atelectasis greater on LEFT with minimal LEFT pleural effusion. Electronically Signed   By: Lavonia Dana M.D.   On: 01/16/2017 13:09   Ct Head Wo Contrast  Result Date: 01/16/2017 CLINICAL DATA:  Headache hypertension EXAM: CT HEAD WITHOUT CONTRAST TECHNIQUE: Contiguous axial images were obtained from the base of the skull through the vertex without intravenous contrast. COMPARISON:  None.  FINDINGS: Motion degraded images limit assessment. Brain: No evidence of acute large vascular territory infarction, hemorrhage, hydrocephalus, extra-axial collection or mass lesion/mass effect. No intraventricular hemorrhage. No effacement of the basal cisterns or fourth ventricle. Hyperdensity along the falx likely related to falcine calcifications, without eccentric or focal thickening to suggest hemorrhage. Minimal periventricular white matter hypodensities consistent with small vessel ischemic disease. Vascular: No hyperdense vessel or unexpected calcification. Skull: Negative for fracture or focal lesion. Sinuses/Orbits: No acute finding. Other: None IMPRESSION: Limited by motion artifacts. Minimal small vessel ischemic disease. No acute intracranial abnormality. Electronically Signed   By: Ashley Royalty M.D.   On: 01/16/2017 18:09   Ct Tibia Fibula Left Wo Contrast  Result Date: 01/16/2017 CLINICAL DATA:  Cellulitis.  Evaluate for osteomyelitis. EXAM: CT OF THE LOWER LEFT EXTREMITY WITHOUT CONTRAST TECHNIQUE: Multidetector CT imaging of the lower left extremity was performed according to the standard protocol. COMPARISON:  None. FINDINGS: Bones/Joint/Cartilage No fracture or dislocation. Normal alignment. No joint effusion. Moderate medial femorotibial compartment joint space narrowing with marginal osteophytes and subchondral sclerosis. Mild lateral femorotibial compartment joint space narrowing with small marginal osteophytes. Small patellofemoral compartment marginal osteophytes. Subchondral cystic changes in the medial corner of the talar dome. Small anterior tibial plafond marginal osteophyte. No aggressive osseous lesion. No periosteal reaction or bone destruction. Ligaments Ligaments are suboptimally evaluated by CT. Muscles and Tendons Muscles are normal.  No muscle atrophy. Soft tissue Severe soft tissue edema within the subcutaneous fat of the left lower leg. Severe soft tissue edema within the  subcutaneous fat of the visualized portions of the right lower leg. No soft tissue mass. IMPRESSION: 1. Severe soft tissue edema within the subcutaneous fat of the left lower leg. Severe soft tissue edema within the subcutaneous fat of the visualized portions of the right lower leg. Differential considerations include severe cellulitis versus venous stasis. Electronically Signed   By: Kathreen Devoid   On: 01/16/2017 17:32    EKG: Independently reviewed. NSR, prolonged qtc, nonspecific ST-T wave changes. Compared to prior.  Assessment/Plan Active Problems:   Hypertensive emergency  Hypertensive emergency: initially on nitro gtt, but now improved after receiving his prior to admission meds (amlodipine, carvedilol, clonidine,  hydralazine, and also labetalol 10 IV x2).  Nitro gtt has been discontinued at this point.  Initially going to start labetalol gtt, but BP's now in 078'M systolic so will continue pta meds with prn labetalol.   Continue PO meds with labetalol 10 mg q2 hrs prn systolic BP >754  Acute Renal Failure:  Appreciate renal recs, pt baseline creatinine around 3, today creatinine is 7. Lasix 160 IV q6 per nephrology Renal US UA with protein, 0-5 RBC's BP control as above   NPO after midnight as may need procedure for dialysis  Heart Failure Exacerbation  Acute Hypoxic Respiratory Failure: history of grade 2 diastolic dysfunction.  EF 50-55% in 2017. ProBNP over 4000, but in setting of renal disease above.  CXR with vascular congestion, minimal L pleural effusion.  Satting well on 2 L [place].     [ ]  echo pending Lasix as above Consider bipap as needed for work of breathing, but on reevaluation didn't appear to need this (and normal PCO2)  Elevated troponin:  Troponin bump to 0.43, suspect this is probably demand ischemia in the setting of renal failure as he is asymptomatic.  EKG with nonspecific changes.  Will continue to trend troponins.    Acute Encephalopathy:  Possibly related to  hypertension vs renal failure vs infection?  In the room during my interview was intermittently falling asleep, no focal deficits on neuro exam.  ABG without hypercarbia.  Head CT negative.  Improved on reevaluation (BP better at this time, he says he was just sleepy).   Utox negative Follow ammonia and TSH Continue to monitor  Left Lower Extremity Cellulitis:  CT with severe soft tissue edema (cellulitis vs venous stasis).  Purulent on my exam.  Normal WBC count.    Blood cultures Vancomycin LE wound order set (A1c, HIV, c/s wound, nutrition, social work and case management as well as arterial studies)  Metabolic Acidosis: likely related to renal failure above (non anion gap)  DVT prophylaxis: SCD's for now (possible procedure tomorrow)  Code Status: full  Family Communication: daughter at bedside  Disposition Plan: pending  Consults called: nephrology  Admission status: stepdown    Fayrene Helper MD [facility] Pager [phone]  If 7PM-7AM, please contact night-coverage [email or web address] Password [facility]  01/16/2017, 6:27 PM

## 2017-01-16 NOTE — ED Notes (Signed)
Gave pt food and drink per request no reaction to medication noted call light in reach talked with pt about calling nurse for increased shortness of breath, itching and rash.

## 2017-01-16 NOTE — ED Provider Notes (Signed)
See Nils Flack, PA-C note for full HPI and workup.  Clinical Course as of Jan 17 2115  Jason [name] Jan 16, 2017  1602 Care handoff from [place], [place]:  [facility] Dr. Florene Glen ordering CT head (altered, hypertensive emergency); will admit if systolic BP <579 vs ICU (nitro drip) New onset CHF; acute renal failure Cr 7, Dr. Joelyn Oms w nephrology recommends transfer to [facility] for [facility]; tachypneic on O2  [JR]  1949 BP improved to 728A systolic. CT head neg.  Dr. Florene Glen accepting admission to step-down  [JR]  2049 Pt waiting for bed at [facility]  [JR]    Clinical Course User Index [JR] Robinson, [place] N, PA-C      Robinson, [place] N, PA-C 01/16/17 2116    Robinson, [place] N, PA-C 01/16/17 2117    Dorie Rank, MD 01/17/17 [phone]

## 2017-01-16 NOTE — ED Provider Notes (Addendum)
Pt presented to the ED with shortness of breath and leg swelling.  Patient states his symptoms have actually been going on for a couple months but acutely worse in the last couple weeks.  He does have a history of hypertension but has not been taking any of his medications.  On exam, pt is hypertensive, tachypneic.  Severe edema in his lower extremities.  Appears to be in new onset heart failure.  Will check, labs,  Xrays.  Monitor closely.  Labs showed acute renal failure.  Likely related to his uncontrolled htn.  Pt started on nitro drip.  Will need to titrate for BP stabilization. Lasix ordered.  Nephrology consulted.  Consulted with medical service but will need to go to ICU/stepdown.  ABG looks ok but will consider bipap for comfort.   Medical screening examination/treatment/procedure(s) were conducted as a shared visit with non-physician practitioner(s) and myself.  I personally evaluated the patient during the encounter.   EKG Interpretation  Date/Time:  Sunday January 16 2017 12:50:39 EST Ventricular Rate:  98 PR Interval:    QRS Duration: 98 QT Interval:  387 QTC Calculation: 495 R Axis:   -5 Text Interpretation:  Sinus rhythm Borderline T wave abnormalities , new since prior tracing Borderline prolonged QT interval qrs voltage decreased since prior tracing Confirmed by Dorie Rank [phone]) on 01/16/2017 1:31:37 PM        .Critical Care Performed by: Dorie Rank, MD Authorized by: Dorie Rank, MD   Critical care provider statement:    Critical care time (minutes):  45   Critical care was necessary to treat or prevent imminent or life-threatening deterioration of the following conditions:  Renal failure   Critical care was time spent personally by me on the following activities:  Discussions with consultants, evaluation of patient's response to treatment, examination of patient, ordering and performing treatments and interventions, ordering and review of laboratory studies,  ordering and review of radiographic studies, pulse oximetry, re-evaluation of patient's condition, obtaining history from patient or surrogate and review of old charts        Dorie Rank, MD 01/17/17 [phone]

## 2017-01-16 NOTE — Consult Note (Signed)
Jason [name] Admit Date: 01/16/2017 01/16/2017 Jason [name] Requesting Physician:  Jason Bamberger MD  Reason for Consult:  HTN Emergency, Renal Failure, Hyperkalemia HPI:  57 year old male presented to the emergency room today with complaints of dyspnea, leg edema, and a sore on the shin of the left leg.  PMH Incudes:  Severe hypertension  Nonadherence to medical therapy  CKD 4, follows with Jason [name], but has been lost to follow-up and last seen 05/2015  Gout  Presenting blood pressure was 216/157.  Placed on nitroglycerin drip.  Blood pressures with some improvement since that time.  Chest x-ray with enlarged cardiac silhouette and pulmonary vascular congestion without overt edema.    Patient denies use of nonsteroidals.  He has not taking any of his blood pressure medications, stating cost and concerns with taking prescription medications.   Dyspnea is particularly pronounced with exertion and when lying flat.  Presenting serum creatinine is 7.0 with a BUN of 94, potassium 5.3, bicarbonate 24.  Hemoglobin 14.3.  Urinalysis with 2+ protein and no hematuria.  He has developed a draining wound on his left anterior shin with significant edema up into the thighs.  His creatinine was 3.2 at 10 months ago.  Denies N/V, anorexia, dysgeusia.    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  He at least now acknowledges the need for medical therapies given how sick he has become.  He denies Tobacco, Cocaine, Meth, other recreational substances.  Denies HA, chest pain, back pain, abd pain, vision changes at current time.    Creatinine, Ser (mg/dL)  Date Value  01/16/2017 7.00 (H)  01/16/2017 6.98 (H)  04/04/2016 3.20 (H)  02/21/2016 2.98 (H)  01/31/2015 2.54 (H)  01/30/2015 2.45 (H)  01/29/2015 2.42 (H)  ]  ROS NSAIDS: denies IV Contrast none TMP/SMX none Hypotension not present Balance of 12 systems is negative w/ exceptions as above  PMH  Past  Medical History:  Diagnosis Date  . AKI (acute kidney injury) ([place]) 01/2015  . Hypertension    PSH  Past Surgical History:  Procedure Laterality Date  . TOE SURGERY    . TONSILLECTOMY     FH  Family History  Problem Relation Age of Onset  . Heart attack Mother 88       CABG hx  . Heart disease Mother   . Hypertension Mother   . Healthy Father   . Healthy Brother    [place]  reports that he has quit smoking. he has never used smokeless tobacco. He reports that he does not drink alcohol or use drugs. Allergies  Allergies  Allergen Reactions  . Lisinopril Cough   Home medications Prior to Admission medications   Medication Sig Start Date End Date Taking? Authorizing Provider  Multiple Vitamin (MULTIVITAMIN WITH MINERALS) TABS tablet Take 1 tablet by mouth daily.   Yes [provider]  amLODipine (NORVASC) 10 MG tablet Take 1 tablet (10 mg total) by mouth at bedtime. Patient not taking: Reported on 01/16/2017 02/22/16   [name], MD  carvedilol (COREG) 6.25 MG tablet Take 1 tablet (6.25 mg total) by mouth 2 (two) times daily with a meal. Patient not taking: Reported on 01/16/2017 04/04/16   [name], PA-C  cloNIDine (CATAPRES) 0.2 MG tablet Take 1 tablet (0.2 mg total) by mouth 2 (two) times daily. Patient not taking: Reported on 01/16/2017 05/03/16   [name], MD  hydrALAZINE (APRESOLINE) 100 MG tablet Take 1 tablet (  100 mg total) by mouth every 8 (eight) hours. Patient not taking: Reported on 01/16/2017 02/22/16   [name], MD  LORazepam (ATIVAN) 1 MG tablet Take 1 tablet (1 mg total) by mouth every 8 (eight) hours as needed for anxiety. Patient not taking: Reported on 01/16/2017 05/03/16   [name], MD    Current Medications Scheduled Meds: Continuous Infusions: . nitroGLYCERIN 35 mcg/min (01/16/17 1532)   PRN Meds:.  CBC Recent Labs  Lab 01/16/17 1249 01/16/17 1316  WBC 6.0  --   NEUTROABS 5.1  --   HGB 14.1 14.3  HCT  39.8 42.0  MCV 85.4  --   PLT 370  --    Basic Metabolic Panel Recent Labs  Lab 01/16/17 1249 01/16/17 1316  NA 135 136  K 5.4* 5.3*  CL 102 102  CO2 21*  --   GLUCOSE 105* 101*  BUN 118* 94*  CREATININE 6.98* 7.00*  CALCIUM 8.8*  --     Physical Exam  Blood pressure (!) 206/159, pulse 84, temperature 97.6 F (36.4 C), temperature source Oral, resp. rate (!) 23, SpO2 100 %. GEN: Mild distress, appears uncomfortablel ENT: NCAT EYES: EOMI CV: No rub, nl s1s2 PULM: Diminished in bases b/l, coarse in upper lung fields ABD: s/nt/nd SKIN: L quarter sized sore on anterior shin EXT:4+ LEE, ptting into thigns   Assessment 84M with progressive renal failure and established CKD4, HTN emergency, hypervolemia, likely CHF exacerbation, and   1. Progressive Renal Failure, presumed HTN nephropathy, likely ESRD 2. HTN Emergency on NTG gtt 3. Hypervolemia 4. Wound of LLE 5. Nonadherence to medical care  Plan 1. I suspect he has progressive renal failure and is now ESRD with a hypertensive emergency.  He likely will need dialysis but it is not immediately indicated.  Can attempt diuresis and resuming oral antihypertensives with weaning of nitroglycerin for gradual improvement in blood pressure. 2. Lasix 160 IV q6h 3. TTE 4. Restart PO meds: carvedilol 25mg  BID, hydaralazine 100 TID, amlodipine 10mg ; ween NTG gtt as able 5. Tox screen 6. N.p.o. after midnight as he might need procedure for dialysis access tomorrow 7. Daily weights, Daily Renal Panel, Strict I/Os, Avoid nephrotoxins (NSAIDs, judicious IV Contrast)    Jason Grippe MD [phone] pgr 01/16/2017, 4:48 PM

## 2017-01-16 NOTE — Progress Notes (Signed)
Pharmacy - Vancomycin  Assessment: 74 yoM with ESRD presents with cellulitis. MD would like to load patient with vancomycin before transfer to [facility] for HD  Plan:  Most recent weight 93 kg in March 2018  Vanc 2g IV x 1  Pharmacist at [facility] will f/u for subsequent doses based on HD  Reuel Boom, PharmD, BCPS Pager: [phone] 01/16/2017, 5:37 PM

## 2017-01-16 NOTE — ED Notes (Signed)
No respiratory or acute distress noted alert and oriented x 3 call light in reach. 

## 2017-01-17 ENCOUNTER — Inpatient Hospital Stay (HOSPITAL_COMMUNITY): Payer: Medicaid Other

## 2017-01-17 ENCOUNTER — Other Ambulatory Visit: Payer: Self-pay

## 2017-01-17 DIAGNOSIS — R0600 Dyspnea, unspecified: Secondary | ICD-10-CM

## 2017-01-17 DIAGNOSIS — L0390 Cellulitis, unspecified: Secondary | ICD-10-CM

## 2017-01-17 LAB — COMPREHENSIVE METABOLIC PANEL
ALT: 56 U/L (ref 17–63)
ANION GAP: 12 (ref 5–15)
AST: 22 U/L (ref 15–41)
Albumin: 2.7 g/dL — ABNORMAL LOW (ref 3.5–5.0)
Alkaline Phosphatase: 46 U/L (ref 38–126)
BILIRUBIN TOTAL: 0.8 mg/dL (ref 0.3–1.2)
BUN: 118 mg/dL — AB (ref 6–20)
CO2: 22 mmol/L (ref 22–32)
Calcium: 8.3 mg/dL — ABNORMAL LOW (ref 8.9–10.3)
Chloride: 101 mmol/L (ref 101–111)
Creatinine, Ser: 6.5 mg/dL — ABNORMAL HIGH (ref 0.61–1.24)
GFR, EST AFRICAN AMERICAN: 10 mL/min — AB (ref >60)
GFR, EST NON AFRICAN AMERICAN: 9 mL/min — AB (ref >60)
Glucose, Bld: 115 mg/dL — ABNORMAL HIGH (ref 65–99)
POTASSIUM: 5.6 mmol/L — AB (ref 3.5–5.1)
Sodium: 135 mmol/L (ref 135–145)
TOTAL PROTEIN: 6.4 g/dL — AB (ref 6.5–8.1)

## 2017-01-17 LAB — BLOOD GAS, ARTERIAL
Acid-base deficit: 3.4 mmol/L — ABNORMAL HIGH (ref 0.0–2.0)
BICARBONATE: 21.8 mmol/L (ref 20.0–28.0)
DRAWN BY: 441261
O2 Content: 2 L/min
O2 Saturation: 95.9 %
Patient temperature: 98.6
pCO2 arterial: 42.5 mmHg (ref 32.0–48.0)
pH, Arterial: 7.331 — ABNORMAL LOW (ref 7.350–7.450)
pO2, Arterial: 91.3 mmHg (ref 83.0–108.0)

## 2017-01-17 LAB — TROPONIN I
TROPONIN I: 0.37 ng/mL — AB (ref <0.03)
TROPONIN I: 0.3 ng/mL — AB (ref <0.03)
TROPONIN I: 0.29 ng/mL — AB (ref <0.03)

## 2017-01-17 LAB — CBC
HEMATOCRIT: 40.1 % (ref 39.0–52.0)
HEMOGLOBIN: 14 g/dL (ref 13.0–17.0)
MCH: 29.9 pg (ref 26.0–34.0)
MCHC: 34.9 g/dL (ref 30.0–36.0)
MCV: 85.5 fL (ref 78.0–100.0)
Platelets: 362 10*3/uL (ref 150–400)
RBC: 4.69 MIL/uL (ref 4.22–5.81)
RDW: 13.4 % (ref 11.5–15.5)
WBC: 17.6 10*3/uL — ABNORMAL HIGH (ref 4.0–10.5)

## 2017-01-17 LAB — HIV ANTIBODY (ROUTINE TESTING W REFLEX): HIV Screen 4th Generation wRfx: NONREACTIVE

## 2017-01-17 LAB — HEMOGLOBIN A1C
HEMOGLOBIN A1C: 4.9 % (ref 4.8–5.6)
Mean Plasma Glucose: 93.93 mg/dL

## 2017-01-17 NOTE — Progress Notes (Signed)
Attempted to place CPAP on patient as ordered without success.  Tried several mask sizes and types and patient wore for less tha 10 seconds, ripped off device and said "NO".  Informed RN, Aaron Edelman of refusal.

## 2017-01-17 NOTE — ED Notes (Signed)
RN will call this RN to get report.

## 2017-01-17 NOTE — ED Notes (Signed)
ED TO INPATIENT HANDOFF REPORT  Name/Age/Gender Jason [name] 57 y.o. male  Code Status    Code Status Orders  (From admission, onward)        Start     Ordered   01/17/17 0148  Full code  Continuous     01/17/17 0148    Code Status History    Date Active Date Inactive Code Status Order ID Comments User Context   01/29/2015 21:17 02/01/2015 13:22 Full Code [id number]  Norval Morton, MD Inpatient      Home/SNF/Other Home  Chief Complaint edema and hypertension  Level of Care/Admitting Diagnosis ED Disposition    ED Disposition Condition [facility] Area: [place] [[id number]]  Level of Care: Stepdown [14]  Diagnosis: Hypertensive emergency [id number]  Admitting Physician: Elodia Florence [[id number]]  Attending Physician: Cephus Slater, A CALDWELL [phone]  Estimated length of stay: past midnight tomorrow  Certification:: I certify this patient will need inpatient services for at least 2 midnights  PT Class (Do Not Modify): Inpatient [101]  PT Acc Code (Do Not Modify): Private [1]       Medical History Past Medical History:  Diagnosis Date  . AKI (acute kidney injury) ([place]) 01/2015  . Hypertension     Allergies Allergies  Allergen Reactions  . Lisinopril Cough    IV Location/Drains/Wounds Patient Lines/Drains/Airways Status   Active Line/Drains/Airways    Name:   Placement date:   Placement time:   Site:   Days:   Peripheral IV 01/17/17 Right Forearm   01/17/17    0146    Forearm   less than 1   Peripheral IV 01/17/17 Left;Upper Arm   01/17/17    0547    Arm   less than 1          Labs/Imaging Results for orders placed or performed during the hospital encounter of 01/16/17 (from the past 48 hour(s))  Basic metabolic panel     Status: Abnormal   Collection Time: 01/16/17 12:49 PM  Result Value Ref Range   Sodium 135 135 - 145 mmol/L   Potassium 5.4 (H) 3.5 - 5.1 mmol/L    Comment: NO VISIBLE HEMOLYSIS   Chloride 102  101 - 111 mmol/L   CO2 21 (L) 22 - 32 mmol/L   Glucose, Bld 105 (H) 65 - 99 mg/dL   BUN 118 (H) 6 - 20 mg/dL    Comment: RESULTS CONFIRMED BY MANUAL DILUTION   Creatinine, Ser 6.98 (H) 0.61 - 1.24 mg/dL   Calcium 8.8 (L) 8.9 - 10.3 mg/dL   GFR calc non Af Amer 8 (L) >60 mL/min   GFR calc Af Amer 9 (L) >60 mL/min    Comment: (NOTE) The eGFR has been calculated using the CKD EPI equation. This calculation has not been validated in all clinical situations. eGFR's persistently <60 mL/min signify possible Chronic Kidney Disease.    Anion gap 12 5 - 15  Brain natriuretic peptide     Status: Abnormal   Collection Time: 01/16/17 12:49 PM  Result Value Ref Range   B Natriuretic Peptide >4,500.0 (H) 0.0 - 100.0 pg/mL  CBC with Differential     Status: Abnormal   Collection Time: 01/16/17 12:49 PM  Result Value Ref Range   WBC 6.0 4.0 - 10.5 K/uL   RBC 4.66 4.22 - 5.81 MIL/uL   Hemoglobin 14.1 13.0 - 17.0 g/dL   HCT 39.8 39.0 - 52.0 %   MCV 85.4  78.0 - 100.0 fL   MCH 30.3 26.0 - 34.0 pg   MCHC 35.4 30.0 - 36.0 g/dL   RDW 13.3 11.5 - 15.5 %   Platelets 370 150 - 400 K/uL   Neutrophils Relative % 85 %   Neutro Abs 5.1 1.7 - 7.7 K/uL   Lymphocytes Relative 6 %   Lymphs Abs 0.4 (L) 0.7 - 4.0 K/uL   Monocytes Relative 9 %   Monocytes Absolute 0.5 0.1 - 1.0 K/uL   Eosinophils Relative 0 %   Eosinophils Absolute 0.0 0.0 - 0.7 K/uL   Basophils Relative 0 %   Basophils Absolute 0.0 0.0 - 0.1 K/uL  I-stat troponin, ED     Status: Abnormal   Collection Time: 01/16/17  1:14 PM  Result Value Ref Range   Troponin i, poc 0.43 (HH) 0.00 - 0.08 ng/mL   Comment NOTIFIED PHYSICIAN    Comment 3            Comment: Due to the release kinetics of cTnI, a negative result within the first hours of the onset of symptoms does not rule out myocardial infarction with certainty. If myocardial infarction is still suspected, repeat the test at appropriate intervals.   Hepatic function panel     Status:  Abnormal   Collection Time: 01/16/17  1:14 PM  Result Value Ref Range   Total Protein 6.6 6.5 - 8.1 g/dL   Albumin 2.8 (L) 3.5 - 5.0 g/dL   AST 24 15 - 41 U/L   ALT 64 (H) 17 - 63 U/L   Alkaline Phosphatase 48 38 - 126 U/L   Total Bilirubin 1.0 0.3 - 1.2 mg/dL   Bilirubin, Direct 0.1 0.1 - 0.5 mg/dL   Indirect Bilirubin 0.9 0.3 - 0.9 mg/dL  Phosphorus     Status: Abnormal   Collection Time: 01/16/17  1:14 PM  Result Value Ref Range   Phosphorus 7.9 (H) 2.5 - 4.6 mg/dL  I-stat Chem 8, ED     Status: Abnormal   Collection Time: 01/16/17  1:16 PM  Result Value Ref Range   Sodium 136 135 - 145 mmol/L   Potassium 5.3 (H) 3.5 - 5.1 mmol/L   Chloride 102 101 - 111 mmol/L   BUN 94 (H) 6 - 20 mg/dL   Creatinine, Ser 7.00 (H) 0.61 - 1.24 mg/dL   Glucose, Bld 101 (H) 65 - 99 mg/dL   Calcium, Ion 1.13 (L) 1.15 - 1.40 mmol/L   TCO2 24 22 - 32 mmol/L   Hemoglobin 14.3 13.0 - 17.0 g/dL   HCT 42.0 39.0 - 52.0 %  Urinalysis, Routine w reflex microscopic     Status: Abnormal   Collection Time: 01/16/17  2:20 PM  Result Value Ref Range   Color, Urine YELLOW YELLOW   APPearance CLEAR CLEAR   Specific Gravity, Urine 1.013 1.005 - 1.030   pH 5.0 5.0 - 8.0   Glucose, UA NEGATIVE NEGATIVE mg/dL   Hgb urine dipstick SMALL (A) NEGATIVE   Bilirubin Urine NEGATIVE NEGATIVE   Ketones, ur NEGATIVE NEGATIVE mg/dL   Protein, ur 100 (A) NEGATIVE mg/dL   Nitrite NEGATIVE NEGATIVE   Leukocytes, UA NEGATIVE NEGATIVE   RBC / HPF 0-5 0 - 5 RBC/hpf   WBC, UA 0-5 0 - 5 WBC/hpf   Bacteria, UA FEW (A) NONE SEEN   Squamous Epithelial / LPF NONE SEEN NONE SEEN  Blood gas, arterial ([facility] & [facility] ONLY)     Status: Abnormal (Preliminary result)  Collection Time: 01/16/17  3:00 PM  Result Value Ref Range   O2 Content 3.0 L/min   pH, Arterial 7.328 (L) 7.350 - 7.450   pCO2 arterial 38.7 32.0 - 48.0 mmHg   pO2, Arterial 104 83.0 - 108.0 mmHg   Bicarbonate 19.8 (L) 20.0 - 28.0 mmol/L   Acid-base deficit 5.2 (H) 0.0 -  2.0 mmol/L   O2 Saturation 97.0 %   Patient temperature 98.6    Collection site BRACHIAL ARTERY    Drawn by [id number]    Sample type ARTERIAL DRAW   Urine rapid drug screen (hosp performed)     Status: None   Collection Time: 01/16/17  4:15 PM  Result Value Ref Range   Opiates NONE DETECTED NONE DETECTED   Cocaine NONE DETECTED NONE DETECTED   Benzodiazepines NONE DETECTED NONE DETECTED   Amphetamines NONE DETECTED NONE DETECTED   Tetrahydrocannabinol NONE DETECTED NONE DETECTED   Barbiturates NONE DETECTED NONE DETECTED    Comment: (NOTE) DRUG SCREEN FOR MEDICAL PURPOSES ONLY.  IF CONFIRMATION IS NEEDED FOR ANY PURPOSE, NOTIFY LAB WITHIN 5 DAYS. LOWEST DETECTABLE LIMITS FOR URINE DRUG SCREEN Drug Class                     Cutoff (ng/mL) Amphetamine and metabolites    1000 Barbiturate and metabolites    200 Benzodiazepine                 500 Tricyclics and metabolites     300 Opiates and metabolites        300 Cocaine and metabolites        300 THC                            50   Troponin I     Status: Abnormal   Collection Time: 01/16/17  7:44 PM  Result Value Ref Range   Troponin I 0.37 (HH) <0.03 ng/mL    Comment: CRITICAL RESULT CALLED TO, READ BACK BY AND VERIFIED WITH: E GOGGIN,RN 01/17/16 2059 RHOLMES CORRECT DATE OF CALL 01/16/2017   Troponin I     Status: Abnormal   Collection Time: 01/17/17  1:17 AM  Result Value Ref Range   Troponin I 0.30 (HH) <0.03 ng/mL    Comment: CRITICAL VALUE NOTED.  VALUE IS CONSISTENT WITH PREVIOUSLY REPORTED AND CALLED VALUE.  Hemoglobin A1c     Status: None   Collection Time: 01/17/17  1:17 AM  Result Value Ref Range   Hgb A1c MFr Bld 4.9 4.8 - 5.6 %    Comment: (NOTE) Pre diabetes:          5.7%-6.4% Diabetes:              >6.4% Glycemic control for   <7.0% adults with diabetes    Mean Plasma Glucose 93.93 mg/dL    Comment: Performed at [facility] [address], [place], [place] [address]  HIV antibody     Status:  None   Collection Time: 01/17/17  1:17 AM  Result Value Ref Range   HIV Screen 4th Generation wRfx Non Reactive Non Reactive    Comment: (NOTE) Performed At: [facility] [place], [place] [id number] Rush Farmer MD VE:[id number]   Troponin I     Status: Abnormal   Collection Time: 01/17/17  5:31 AM  Result Value Ref Range   Troponin I 0.29 (HH) <0.03 ng/mL  Comment: CRITICAL VALUE NOTED.  VALUE IS CONSISTENT WITH PREVIOUSLY REPORTED AND CALLED VALUE.  Comprehensive metabolic panel     Status: Abnormal   Collection Time: 01/17/17  5:31 AM  Result Value Ref Range   Sodium 135 135 - 145 mmol/L   Potassium 5.6 (H) 3.5 - 5.1 mmol/L   Chloride 101 101 - 111 mmol/L   CO2 22 22 - 32 mmol/L   Glucose, Bld 115 (H) 65 - 99 mg/dL   BUN 118 (H) 6 - 20 mg/dL    Comment: RESULTS CONFIRMED BY MANUAL DILUTION   Creatinine, Ser 6.50 (H) 0.61 - 1.24 mg/dL   Calcium 8.3 (L) 8.9 - 10.3 mg/dL   Total Protein 6.4 (L) 6.5 - 8.1 g/dL   Albumin 2.7 (L) 3.5 - 5.0 g/dL   AST 22 15 - 41 U/L   ALT 56 17 - 63 U/L   Alkaline Phosphatase 46 38 - 126 U/L   Total Bilirubin 0.8 0.3 - 1.2 mg/dL   GFR calc non Af Amer 9 (L) >60 mL/min   GFR calc Af Amer 10 (L) >60 mL/min    Comment: (NOTE) The eGFR has been calculated using the CKD EPI equation. This calculation has not been validated in all clinical situations. eGFR's persistently <60 mL/min signify possible Chronic Kidney Disease.    Anion gap 12 5 - 15  CBC     Status: Abnormal   Collection Time: 01/17/17  5:31 AM  Result Value Ref Range   WBC 17.6 (H) 4.0 - 10.5 K/uL   RBC 4.69 4.22 - 5.81 MIL/uL   Hemoglobin 14.0 13.0 - 17.0 g/dL   HCT 40.1 39.0 - 52.0 %   MCV 85.5 78.0 - 100.0 fL   MCH 29.9 26.0 - 34.0 pg   MCHC 34.9 30.0 - 36.0 g/dL   RDW 13.4 11.5 - 15.5 %   Platelets 362 150 - 400 K/uL  Blood gas, arterial     Status: Abnormal   Collection Time: 01/17/17  2:40 PM  Result Value Ref Range   O2 Content 2.0 L/min    Delivery systems NASAL CANNULA    pH, Arterial 7.331 (L) 7.350 - 7.450   pCO2 arterial 42.5 32.0 - 48.0 mmHg   pO2, Arterial 91.3 83.0 - 108.0 mmHg   Bicarbonate 21.8 20.0 - 28.0 mmol/L   Acid-base deficit 3.4 (H) 0.0 - 2.0 mmol/L   O2 Saturation 95.9 %   Patient temperature 98.6    Collection site LEFT BRACHIAL    Drawn by [id number]    Sample type ARTERIAL DRAW    Allens test (pass/fail) PASS PASS   Dg Chest 2 View  Result Date: 01/16/2017 CLINICAL DATA:  Shortness of breath, BILATERAL lower extremity edema with weeping wounds, on oxygen, history hypertension, former smoker EXAM: CHEST  2 VIEW COMPARISON:  Non FINDINGS: Enlargement of cardiac silhouette with pulmonary vascular congestion. Rotated to the RIGHT. Subsegmental atelectasis LEFT mid lung and at both bases greater on LEFT. Minimal LEFT pleural effusion. No definite acute infiltrate, pleural effusion or pneumothorax. IMPRESSION: Enlargement of cardiac silhouette with vascular congestion. Scattered atelectasis greater on LEFT with minimal LEFT pleural effusion. Electronically Signed   By: Lavonia Dana M.D.   On: 01/16/2017 13:09   Ct Head Wo Contrast  Result Date: 01/16/2017 CLINICAL DATA:  Headache hypertension EXAM: CT HEAD WITHOUT CONTRAST TECHNIQUE: Contiguous axial images were obtained from the base of the skull through the vertex without intravenous contrast. COMPARISON:  None. FINDINGS: Motion degraded images  limit assessment. Brain: No evidence of acute large vascular territory infarction, hemorrhage, hydrocephalus, extra-axial collection or mass lesion/mass effect. No intraventricular hemorrhage. No effacement of the basal cisterns or fourth ventricle. Hyperdensity along the falx likely related to falcine calcifications, without eccentric or focal thickening to suggest hemorrhage. Minimal periventricular white matter hypodensities consistent with small vessel ischemic disease. Vascular: No hyperdense vessel or unexpected calcification.  Skull: Negative for fracture or focal lesion. Sinuses/Orbits: No acute finding. Other: None IMPRESSION: Limited by motion artifacts. Minimal small vessel ischemic disease. No acute intracranial abnormality. Electronically Signed   By: Ashley Royalty M.D.   On: 01/16/2017 18:09   US Renal  Result Date: 01/16/2017 CLINICAL DATA:  Acute renal failure.  History of hypertension. EXAM: RENAL / URINARY TRACT ULTRASOUND COMPLETE COMPARISON:  01/29/2015 FINDINGS: Right Kidney: Length: 9.9 cm. Echogenic renal parenchyma. No focal mass or hydronephrosis. Left Kidney: Length: 9.2 cm. Echogenic renal parenchyma. No focal mass or hydronephrosis. Bladder: Small amount of debris is noted within the bladder. Additional: Bilateral pleural effusions are noted. IMPRESSION: 1. Echogenic renal parenchyma bilaterally, compatible with the history of renal failure. No hydronephrosis. 2. Bilateral pleural effusions. Electronically Signed   By: Nolon Nations M.D.   On: 01/16/2017 18:47   Ct Tibia Fibula Left Wo Contrast  Result Date: 01/16/2017 CLINICAL DATA:  Cellulitis.  Evaluate for osteomyelitis. EXAM: CT OF THE LOWER LEFT EXTREMITY WITHOUT CONTRAST TECHNIQUE: Multidetector CT imaging of the lower left extremity was performed according to the standard protocol. COMPARISON:  None. FINDINGS: Bones/Joint/Cartilage No fracture or dislocation. Normal alignment. No joint effusion. Moderate medial femorotibial compartment joint space narrowing with marginal osteophytes and subchondral sclerosis. Mild lateral femorotibial compartment joint space narrowing with small marginal osteophytes. Small patellofemoral compartment marginal osteophytes. Subchondral cystic changes in the medial corner of the talar dome. Small anterior tibial plafond marginal osteophyte. No aggressive osseous lesion. No periosteal reaction or bone destruction. Ligaments Ligaments are suboptimally evaluated by CT. Muscles and Tendons Muscles are normal.  No muscle  atrophy. Soft tissue Severe soft tissue edema within the subcutaneous fat of the left lower leg. Severe soft tissue edema within the subcutaneous fat of the visualized portions of the right lower leg. No soft tissue mass. IMPRESSION: 1. Severe soft tissue edema within the subcutaneous fat of the left lower leg. Severe soft tissue edema within the subcutaneous fat of the visualized portions of the right lower leg. Differential considerations include severe cellulitis versus venous stasis. Electronically Signed   By: Kathreen Devoid   On: 01/16/2017 17:32    Pending Labs Unresulted Labs (From admission, onward)   Start     Ordered   01/18/17 0500  Protime-INR  Tomorrow morning,   R     01/17/17 1547   01/18/17 5400  Basic metabolic panel  Tomorrow morning,   R     01/17/17 1651   01/17/17 1653  Hepatitis B surface antigen  Once,   R     01/17/17 1653   01/17/17 1653  Hepatitis B surface antibody  Once,   R     01/17/17 1653   01/17/17 1653  Hepatitis B core antibody, IgM  Once,   R     01/17/17 1653   01/16/17 1711  Culture, blood (routine x 2)  BLOOD CULTURE X 2,   R     01/16/17 1711   01/16/17 1612  TSH  Add-on,   R     01/16/17 1611   01/16/17 1612  Ammonia  Add-on,  R     01/16/17 1611      Vitals/Pain Today's Vitals   01/17/17 1530 01/17/17 1600 01/17/17 1618 01/17/17 1651  BP: 135/90 131/86 134/80 125/85  Pulse: 64 62 (!) 59 60  Resp: (!) 24 (!) 22  17  Temp:      TempSrc:      SpO2: 99% 100%  100%  Weight:      Height:      PainSc:        Isolation Precautions No active isolations  Medications Medications  hydrALAZINE (APRESOLINE) tablet 100 mg (100 mg Oral Given 01/17/17 1449)  furosemide (LASIX) 160 mg in dextrose 5 % 50 mL IVPB (0 mg Intravenous Stopped 01/17/17 1430)  carvedilol (COREG) tablet 25 mg (25 mg Oral Given 01/17/17 1618)  amLODipine (NORVASC) tablet 10 mg (10 mg Oral Given 01/17/17 1057)  labetalol (NORMODYNE,TRANDATE) injection 10 mg (10 mg Intravenous  Given 01/17/17 0551)  furosemide (LASIX) injection 40 mg (40 mg Intravenous Given 01/16/17 1407)  cloNIDine (CATAPRES) tablet 0.2 mg (0.2 mg Oral Given 01/16/17 1554)  labetalol (NORMODYNE,TRANDATE) injection 10 mg (10 mg Intravenous Given 01/16/17 1550)  vancomycin (VANCOCIN) 2,000 mg in sodium chloride 0.9 % 500 mL IVPB (0 mg Intravenous Stopped 01/16/17 2344)    Mobility non-ambulatory

## 2017-01-17 NOTE — Care Management Note (Signed)
Case Management Note  CM consulted for medication assistance.  [facility] appointments with his PCP at [facility], Ladene Artist, NP.  Pt needs to check with resources through his PCP for further medication assistance as without being established at one of the [facility] clinics, he does not have access to the [facility] pharmacy and it's resources.  PCP placed on AVS.  CM additionally consulted for equipment and will defer to inpt CM for needs prior to transition home.

## 2017-01-17 NOTE — ED Notes (Signed)
Attempted to call report x2 with no answer. Nurse was off floor with another patient. Awaiting a call back.

## 2017-01-17 NOTE — Progress Notes (Signed)
CSW acknowledges consult for accessing medications for discharge. For further assistance with medication please consult RNCM. At this time there are no further CSW needs. CSW signing off. Please re consult if new need arises.      Virgie Dad Laquanna Veazey, MSW, [place] [facility] Clinical Social Worker [phone]

## 2017-01-17 NOTE — Consult Note (Addendum)
[place] Nurse wound consult note Reason for Consult:Bilateral LEs with edema, full thickness and partial thickness ulcerations L>R. Wound type: Venous insufficiency.  ABI's performed yesterday are: Right = 1.27 and Left = 1.33. Pressure Injury POA: N/A Measurement: Right LE full thickness wound at anterior LE measures 2cm x 2.4cm x 0.2cm with maceration in the periwound area. Red wound bed, moderate drainage (serous) with grey macerated tissue surrounding. Right LE edema, no redness or warmth. Left Le with 14cm x 20cm area of full and scattered partial thickness tissue loss.  5cm x 7cm wound at the anterior LE with wound bed obscured by the presence of white non-viable tissue. Wound bed:As described abvoe Drainage (amount, consistency, odor) Small to moderate amount of serous exudate at this time, but with edematous LEs and Lasix ordered one would expect the exudate to increase.  Periwound:As described above. Dressing procedure/placement/frequency:I have provided guidance for the Nursing staff that included topical care to the ulcerations using an antimicrobial with astringent properties (xeroform gauze) topped with gauze and ABD pads and followed by wrapping from toes to knees with Kerlix roll gauze and then 6-inch ACE bandages wrapped in a similar fashion. Wound care will be twice daily for 4 days then once daily for 14 days.  Patient to follow LEs will benefit from elevation and heels from flotation. I have provided pressure redistribution heel boots (Prevalon) for the bilateral heels.  Patient will require services of [facility] and follow up by their PCP. If you agree, please order/arrange.  Long term, wrapping with Unna's Boots (zinc paste bandages) may be beneficial but this is not recommended until anterior left foot ulcerations have improved.  [facility] nursing team will not follow, but will remain available to this patient, the nursing and medical teams.  Please re-consult if needed. Thanks, Maudie Flakes,  MSN, RN, [place], Arther Abbott  Pager# [phone]

## 2017-01-17 NOTE — ED Notes (Signed)
No respiratory or acute distress noted alert and oriented x 3 call light in reach no reaction to medication noted. 

## 2017-01-17 NOTE — Progress Notes (Addendum)
PROGRESS NOTE    Lyonel Morejon  PJK:[id number] DOB: 1960/07/26 DOA: 01/16/2017 PCP: Wandra Mannan, NP   Brief Narrative:  Corey Laski is Braeden Dolinski 57 y.o. male with medical history significant of hypertension, chronic kidney disease, anemia, inguinal hernia presenting with worsening shortness of breath, lower extremity edema, and altered mental status.  Found to have hypertensive emergency with worsening renal function and heart failure as well as acute encephalopathy and Orlan Aversa LLE cellulitis.   Assessment & Plan:   Principal Problem:   Hypertensive emergency Active Problems:   Acute kidney injury ([place])   Essential hypertension   Chronic kidney disease   Elevated troponin   Acute CHF (congestive heart failure) (HCC)   Cellulitis   Hypertensive emergency: BP improved.   Continue amlodipine, carvedilol, hydralazine as well as lasix (received clonidine initially, but this was not continued and BP has come down well, consider adding this back as needed) Labetalol prn systolic greater than 809.   S/p Nitro gtt has been discontinued at this point.    Acute Renal Failure:  Appreciate renal recs, pt baseline creatinine around 3, on admission creatinine is 7. Slightly improved to 6.5. Lasix 160 IV q6 per nephrology Renal US without hydro UA with protein, 0-5 RBC's BP control as above   Working on transfer [facility], NPO after midnight, will need tunneled dialysis catheter 1/8 (IR order placed - discussed with nephrology)  Heart Failure Exacerbation  Acute Hypoxic Respiratory Failure: history of grade 2 diastolic dysfunction.  EF 50-55% in 2017. ProBNP over 4000, but in setting of renal disease above.  CXR with vascular congestion, minimal L pleural effusion.  Satting well on room air    [ ]  echo with reduced EF (98-33%, grade 2 diastolic dysfunction, elevated PASP - see report) Discussed with cardiology, they will see patient tomorrow Lasix as above Consider bipap as needed for work of breathing  (normal pCO2, but occasionally with increased wob)  Elevated troponin:  Troponin bump to 0.43, downtrending. Suspect this is probably demand ischemia in the setting of renal failure as he is asymptomatic.  EKG with nonspecific changes.   Acute Encephalopathy:  Possibly related to hypertension vs renal failure vs infection?  In the room during my interview was intermittently falling asleep, no focal deficits on neuro exam.  ABG without hypercarbia.  Head CT negative.  He continues to wax and wane, possibly due to uremia.  Utox negative Follow ammonia and TSH Continue to monitor  Left Lower Extremity Cellulitis:  CT with severe soft tissue edema (cellulitis vs venous stasis).  Purulent on my exam.   Elevated WBC on 1/7 Blood cultures Vancomycin LE wound order set (A1c, HIV, c/s wound, nutrition, social work and case management as well as arterial studies)  Metabolic Acidosis: likely related to renal failure above (non anion gap)  DVT prophylaxis: SCD's given need for procedure Code Status: full  Family Communication: daughters at bedside Disposition Plan: pending   Consultants:   Nephrology  Cardiology  IR  Procedures: (Don't include imaging studies which can be auto populated. Include things that cannot be auto populated i.e. Echo, Carotid and venous dopplers, Foley, Bipap, HD, tubes/drains, wound vac, central lines etc) 01/17/17 Echo Study Conclusions  - Left ventricle: The cavity size was normal. There was moderate   concentric hypertrophy. Systolic function was moderately reduced.   The estimated ejection fraction was in the range of 35% to 40%.   Moderate diffuse hypokinesis. Features are consistent with Natashia Roseman   pseudonormal left ventricular filling  pattern, with concomitant   abnormal relaxation and increased filling pressure (grade 2   diastolic dysfunction). Doppler parameters are consistent with   high ventricular filling pressure. - Aortic valve: Trileaflet; mildly  thickened, mildly calcified   leaflets. - Mitral valve: There was mild regurgitation. - Right ventricle: The cavity size was severely dilated. Wall   thickness was normal. - Right atrium: The atrium was severely dilated. - Tricuspid valve: There was mild-moderate regurgitation. - Pulmonary arteries: PA peak pressure: 64 mm Hg (S). - Pericardium, extracardiac: Dylon Correa trivial, free-flowing pericardial   effusion was identified circumferential to the heart. The fluid   had no internal echoes.  Impressions:  - The right ventricular systolic pressure was increased consistent   with moderate pulmonary hypertension.  ABI Final Interpretation: Right: Resting right ankle-brachial index is within normal range. No evidence of significant right lower extremity arterial disease. Left: Resting left ankle-brachial index is within normal range. No evidence of significant left lower extremity arterial disease.  Antimicrobials: Anti-infectives (From admission, onward)   Start     Dose/Rate Route Frequency Ordered Stop   01/16/17 1800  vancomycin (VANCOCIN) 2,000 mg in sodium chloride 0.9 % 500 mL IVPB     2,000 mg 250 mL/hr over 120 Minutes Intravenous  Once 01/16/17 1734 01/16/17 2344   01/16/17 1730  vancomycin (VANCOCIN) IVPB 1000 mg/200 mL premix  Status:  Discontinued     1,000 mg 200 mL/hr over 60 Minutes Intravenous  Once 01/16/17 1720 01/16/17 1734     Subjective: No pain.  Feeling ok.   Still sleepy.  Keisean Skowron&Ox2 (doesn't know month, knows hospital, but difficult time with [facility])  Objective: Vitals:   01/17/17 1530 01/17/17 1600 01/17/17 1618 01/17/17 1651  BP: 135/90 131/86 134/80 125/85  Pulse: 64 62 (!) 59 60  Resp: (!) 24 (!) 22  17  Temp:      TempSrc:      SpO2: 99% 100%  100%  Weight:      Height:        Intake/Output Summary (Last 24 hours) at 01/17/2017 1702 Last data filed at 01/17/2017 1629 Gross per 24 hour  Intake 300 ml  Output -  Net 300 ml   Filed Weights   01/17/17  0213  Weight: 98.9 kg (218 lb)    Examination:  General: No acute distress. Cardiovascular: Heart sounds show Aubrianna Orchard regular rate, and rhythm. No gallops or rubs. No murmurs.  Lungs: Crackles at bases, increased WOB Abdomen: Soft, nontender, nondistended with normal active bowel sounds. No masses. No hepatosplenomegaly. Neurological: Alert and oriented 2. Moves all extremities 4 with equal strength. Cranial nerves II through XII grossly intact. Skin: Warm and dry. No rashes or lesions. Extremities: No clubbing or cyanosis. Bilateral LEE with purulent cellulitis to LLE Psychiatric:  Insight and judgment are impaired.  Data Reviewed: I have personally reviewed following labs and imaging studies  CBC: Recent Labs  Lab 01/16/17 1249 01/16/17 1316 01/17/17 0531  WBC 6.0  --  17.6*  NEUTROABS 5.1  --   --   HGB 14.1 14.3 14.0  HCT 39.8 42.0 40.1  MCV 85.4  --  85.5  PLT 370  --  253   Basic Metabolic Panel: Recent Labs  Lab 01/16/17 1249 01/16/17 1314 01/16/17 1316 01/17/17 0531  NA 135  --  136 135  K 5.4*  --  5.3* 5.6*  CL 102  --  102 101  CO2 21*  --   --  22  GLUCOSE  105*  --  101* 115*  BUN 118*  --  94* 118*  CREATININE 6.98*  --  7.00* 6.50*  CALCIUM 8.8*  --   --  8.3*  PHOS  --  7.9*  --   --    GFR: Estimated Creatinine Clearance: 16.1 mL/min (Haiven Nardone) (by C-G formula based on SCr of 6.5 mg/dL (H)). Liver Function Tests: Recent Labs  Lab 01/16/17 1314 01/17/17 0531  AST 24 22  ALT 64* 56  ALKPHOS 48 46  BILITOT 1.0 0.8  PROT 6.6 6.4*  ALBUMIN 2.8* 2.7*   No results for input(s): LIPASE, AMYLASE in the last 168 hours. No results for input(s): AMMONIA in the last 168 hours. Coagulation Profile: No results for input(s): INR, PROTIME in the last 168 hours. Cardiac Enzymes: Recent Labs  Lab 01/16/17 1944 01/17/17 0117 01/17/17 0531  TROPONINI 0.37* 0.30* 0.29*   BNP (last 3 results) No results for input(s): PROBNP in the last 8760  hours. HbA1C: Recent Labs    01/17/17 0117  HGBA1C 4.9   CBG: No results for input(s): GLUCAP in the last 168 hours. Lipid Profile: No results for input(s): CHOL, HDL, LDLCALC, TRIG, CHOLHDL, LDLDIRECT in the last 72 hours. Thyroid Function Tests: No results for input(s): TSH, T4TOTAL, FREET4, T3FREE, THYROIDAB in the last 72 hours. Anemia Panel: No results for input(s): VITAMINB12, FOLATE, FERRITIN, TIBC, IRON, RETICCTPCT in the last 72 hours. Sepsis Labs: No results for input(s): PROCALCITON, LATICACIDVEN in the last 168 hours.  No results found for this or any previous visit (from the past 240 hour(s)).       Radiology Studies: Dg Chest 2 View  Result Date: 01/16/2017 CLINICAL DATA:  Shortness of breath, BILATERAL lower extremity edema with weeping wounds, on oxygen, history hypertension, former smoker EXAM: CHEST  2 VIEW COMPARISON:  Non FINDINGS: Enlargement of cardiac silhouette with pulmonary vascular congestion. Rotated to the RIGHT. Subsegmental atelectasis LEFT mid lung and at both bases greater on LEFT. Minimal LEFT pleural effusion. No definite acute infiltrate, pleural effusion or pneumothorax. IMPRESSION: Enlargement of cardiac silhouette with vascular congestion. Scattered atelectasis greater on LEFT with minimal LEFT pleural effusion. Electronically Signed   By: Lavonia Dana M.D.   On: 01/16/2017 13:09   Ct Head Wo Contrast  Result Date: 01/16/2017 CLINICAL DATA:  Headache hypertension EXAM: CT HEAD WITHOUT CONTRAST TECHNIQUE: Contiguous axial images were obtained from the base of the skull through the vertex without intravenous contrast. COMPARISON:  None. FINDINGS: Motion degraded images limit assessment. Brain: No evidence of acute large vascular territory infarction, hemorrhage, hydrocephalus, extra-axial collection or mass lesion/mass effect. No intraventricular hemorrhage. No effacement of the basal cisterns or fourth ventricle. Hyperdensity along the falx likely  related to falcine calcifications, without eccentric or focal thickening to suggest hemorrhage. Minimal periventricular white matter hypodensities consistent with small vessel ischemic disease. Vascular: No hyperdense vessel or unexpected calcification. Skull: Negative for fracture or focal lesion. Sinuses/Orbits: No acute finding. Other: None IMPRESSION: Limited by motion artifacts. Minimal small vessel ischemic disease. No acute intracranial abnormality. Electronically Signed   By: Ashley Royalty M.D.   On: 01/16/2017 18:09   US Renal  Result Date: 01/16/2017 CLINICAL DATA:  Acute renal failure.  History of hypertension. EXAM: RENAL / URINARY TRACT ULTRASOUND COMPLETE COMPARISON:  01/29/2015 FINDINGS: Right Kidney: Length: 9.9 cm. Echogenic renal parenchyma. No focal mass or hydronephrosis. Left Kidney: Length: 9.2 cm. Echogenic renal parenchyma. No focal mass or hydronephrosis. Bladder: Small amount of debris is noted within the  bladder. Additional: Bilateral pleural effusions are noted. IMPRESSION: 1. Echogenic renal parenchyma bilaterally, compatible with the history of renal failure. No hydronephrosis. 2. Bilateral pleural effusions. Electronically Signed   By: Nolon Nations M.D.   On: 01/16/2017 18:47   Ct Tibia Fibula Left Wo Contrast  Result Date: 01/16/2017 CLINICAL DATA:  Cellulitis.  Evaluate for osteomyelitis. EXAM: CT OF THE LOWER LEFT EXTREMITY WITHOUT CONTRAST TECHNIQUE: Multidetector CT imaging of the lower left extremity was performed according to the standard protocol. COMPARISON:  None. FINDINGS: Bones/Joint/Cartilage No fracture or dislocation. Normal alignment. No joint effusion. Moderate medial femorotibial compartment joint space narrowing with marginal osteophytes and subchondral sclerosis. Mild lateral femorotibial compartment joint space narrowing with small marginal osteophytes. Small patellofemoral compartment marginal osteophytes. Subchondral cystic changes in the medial corner of  the talar dome. Small anterior tibial plafond marginal osteophyte. No aggressive osseous lesion. No periosteal reaction or bone destruction. Ligaments Ligaments are suboptimally evaluated by CT. Muscles and Tendons Muscles are normal.  No muscle atrophy. Soft tissue Severe soft tissue edema within the subcutaneous fat of the left lower leg. Severe soft tissue edema within the subcutaneous fat of the visualized portions of the right lower leg. No soft tissue mass. IMPRESSION: 1. Severe soft tissue edema within the subcutaneous fat of the left lower leg. Severe soft tissue edema within the subcutaneous fat of the visualized portions of the right lower leg. Differential considerations include severe cellulitis versus venous stasis. Electronically Signed   By: Kathreen Devoid   On: 01/16/2017 17:32        Scheduled Meds: . amLODipine  10 mg Oral Daily  . carvedilol  25 mg Oral BID WC  . hydrALAZINE  100 mg Oral Q8H   Continuous Infusions: . furosemide Stopped (01/17/17 1430)     LOS: 1 day    Time spent: over [place], MD [facility] Pager [phone]  If 7PM-7AM, please contact night-coverage [email or web address] Password [email or web address] 01/17/2017, 5:02 PM

## 2017-01-17 NOTE — ED Notes (Signed)
Pharmacy called for Lasix. They are sending it now.

## 2017-01-17 NOTE — ED Notes (Signed)
ED TO INPATIENT HANDOFF REPORT  Name/Age/Gender Valda Favia 57 y.o. male  Code Status    Code Status Orders  (From admission, onward)        Start     Ordered   01/17/17 0148  Full code  Continuous     01/17/17 0148    Code Status History    Date Active Date Inactive Code Status Order ID Comments User Context   01/29/2015 21:17 02/01/2015 13:22 Full Code [id number]  Norval Morton, MD Inpatient      Home/SNF/Other Home  Chief Complaint edema and hypertension  Level of Care/Admitting Diagnosis ED Disposition    ED Disposition Condition [facility] Area: [place] [[id number]]  Level of Care: Stepdown [14]  Diagnosis: Hypertensive emergency [id number]  Admitting Physician: Elodia Florence [[id number]]  Attending Physician: Cephus Slater, A CALDWELL [phone]  Estimated length of stay: past midnight tomorrow  Certification:: I certify this patient will need inpatient services for at least 2 midnights  PT Class (Do Not Modify): Inpatient [101]  PT Acc Code (Do Not Modify): Private [1]       Medical History Past Medical History:  Diagnosis Date  . AKI (acute kidney injury) ([place]) 01/2015  . Hypertension     Allergies Allergies  Allergen Reactions  . Lisinopril Cough    IV Location/Drains/Wounds Patient Lines/Drains/Airways Status   Active Line/Drains/Airways    Name:   Placement date:   Placement time:   Site:   Days:   Peripheral IV 01/17/17 Right Forearm   01/17/17    0146    Forearm   less than 1   Peripheral IV 01/17/17 Left;Upper Arm   01/17/17    0547    Arm   less than 1          Labs/Imaging Results for orders placed or performed during the hospital encounter of 01/16/17 (from the past 48 hour(s))  Basic metabolic panel     Status: Abnormal   Collection Time: 01/16/17 12:49 PM  Result Value Ref Range   Sodium 135 135 - 145 mmol/L   Potassium 5.4 (H) 3.5 - 5.1 mmol/L    Comment: NO VISIBLE HEMOLYSIS   Chloride 102  101 - 111 mmol/L   CO2 21 (L) 22 - 32 mmol/L   Glucose, Bld 105 (H) 65 - 99 mg/dL   BUN 118 (H) 6 - 20 mg/dL    Comment: RESULTS CONFIRMED BY MANUAL DILUTION   Creatinine, Ser 6.98 (H) 0.61 - 1.24 mg/dL   Calcium 8.8 (L) 8.9 - 10.3 mg/dL   GFR calc non Af Amer 8 (L) >60 mL/min   GFR calc Af Amer 9 (L) >60 mL/min    Comment: (NOTE) The eGFR has been calculated using the CKD EPI equation. This calculation has not been validated in all clinical situations. eGFR's persistently <60 mL/min signify possible Chronic Kidney Disease.    Anion gap 12 5 - 15  Brain natriuretic peptide     Status: Abnormal   Collection Time: 01/16/17 12:49 PM  Result Value Ref Range   B Natriuretic Peptide >4,500.0 (H) 0.0 - 100.0 pg/mL  CBC with Differential     Status: Abnormal   Collection Time: 01/16/17 12:49 PM  Result Value Ref Range   WBC 6.0 4.0 - 10.5 K/uL   RBC 4.66 4.22 - 5.81 MIL/uL   Hemoglobin 14.1 13.0 - 17.0 g/dL   HCT 39.8 39.0 - 52.0 %   MCV 85.4  78.0 - 100.0 fL   MCH 30.3 26.0 - 34.0 pg   MCHC 35.4 30.0 - 36.0 g/dL   RDW 13.3 11.5 - 15.5 %   Platelets 370 150 - 400 K/uL   Neutrophils Relative % 85 %   Neutro Abs 5.1 1.7 - 7.7 K/uL   Lymphocytes Relative 6 %   Lymphs Abs 0.4 (L) 0.7 - 4.0 K/uL   Monocytes Relative 9 %   Monocytes Absolute 0.5 0.1 - 1.0 K/uL   Eosinophils Relative 0 %   Eosinophils Absolute 0.0 0.0 - 0.7 K/uL   Basophils Relative 0 %   Basophils Absolute 0.0 0.0 - 0.1 K/uL  I-stat troponin, ED     Status: Abnormal   Collection Time: 01/16/17  1:14 PM  Result Value Ref Range   Troponin i, poc 0.43 (HH) 0.00 - 0.08 ng/mL   Comment NOTIFIED PHYSICIAN    Comment 3            Comment: Due to the release kinetics of cTnI, a negative result within the first hours of the onset of symptoms does not rule out myocardial infarction with certainty. If myocardial infarction is still suspected, repeat the test at appropriate intervals.   Hepatic function panel     Status:  Abnormal   Collection Time: 01/16/17  1:14 PM  Result Value Ref Range   Total Protein 6.6 6.5 - 8.1 g/dL   Albumin 2.8 (L) 3.5 - 5.0 g/dL   AST 24 15 - 41 U/L   ALT 64 (H) 17 - 63 U/L   Alkaline Phosphatase 48 38 - 126 U/L   Total Bilirubin 1.0 0.3 - 1.2 mg/dL   Bilirubin, Direct 0.1 0.1 - 0.5 mg/dL   Indirect Bilirubin 0.9 0.3 - 0.9 mg/dL  Phosphorus     Status: Abnormal   Collection Time: 01/16/17  1:14 PM  Result Value Ref Range   Phosphorus 7.9 (H) 2.5 - 4.6 mg/dL  I-stat Chem 8, ED     Status: Abnormal   Collection Time: 01/16/17  1:16 PM  Result Value Ref Range   Sodium 136 135 - 145 mmol/L   Potassium 5.3 (H) 3.5 - 5.1 mmol/L   Chloride 102 101 - 111 mmol/L   BUN 94 (H) 6 - 20 mg/dL   Creatinine, Ser 7.00 (H) 0.61 - 1.24 mg/dL   Glucose, Bld 101 (H) 65 - 99 mg/dL   Calcium, Ion 1.13 (L) 1.15 - 1.40 mmol/L   TCO2 24 22 - 32 mmol/L   Hemoglobin 14.3 13.0 - 17.0 g/dL   HCT 42.0 39.0 - 52.0 %  Urinalysis, Routine w reflex microscopic     Status: Abnormal   Collection Time: 01/16/17  2:20 PM  Result Value Ref Range   Color, Urine YELLOW YELLOW   APPearance CLEAR CLEAR   Specific Gravity, Urine 1.013 1.005 - 1.030   pH 5.0 5.0 - 8.0   Glucose, UA NEGATIVE NEGATIVE mg/dL   Hgb urine dipstick SMALL (A) NEGATIVE   Bilirubin Urine NEGATIVE NEGATIVE   Ketones, ur NEGATIVE NEGATIVE mg/dL   Protein, ur 100 (A) NEGATIVE mg/dL   Nitrite NEGATIVE NEGATIVE   Leukocytes, UA NEGATIVE NEGATIVE   RBC / HPF 0-5 0 - 5 RBC/hpf   WBC, UA 0-5 0 - 5 WBC/hpf   Bacteria, UA FEW (A) NONE SEEN   Squamous Epithelial / LPF NONE SEEN NONE SEEN  Blood gas, arterial ([facility] & [facility] ONLY)     Status: Abnormal (Preliminary result)  Collection Time: 01/16/17  3:00 PM  Result Value Ref Range   O2 Content 3.0 L/min   pH, Arterial 7.328 (L) 7.350 - 7.450   pCO2 arterial 38.7 32.0 - 48.0 mmHg   pO2, Arterial 104 83.0 - 108.0 mmHg   Bicarbonate 19.8 (L) 20.0 - 28.0 mmol/L   Acid-base deficit 5.2 (H) 0.0 -  2.0 mmol/L   O2 Saturation 97.0 %   Patient temperature 98.6    Collection site BRACHIAL ARTERY    Drawn by [id number]    Sample type ARTERIAL DRAW   Urine rapid drug screen (hosp performed)     Status: None   Collection Time: 01/16/17  4:15 PM  Result Value Ref Range   Opiates NONE DETECTED NONE DETECTED   Cocaine NONE DETECTED NONE DETECTED   Benzodiazepines NONE DETECTED NONE DETECTED   Amphetamines NONE DETECTED NONE DETECTED   Tetrahydrocannabinol NONE DETECTED NONE DETECTED   Barbiturates NONE DETECTED NONE DETECTED    Comment: (NOTE) DRUG SCREEN FOR MEDICAL PURPOSES ONLY.  IF CONFIRMATION IS NEEDED FOR ANY PURPOSE, NOTIFY LAB WITHIN 5 DAYS. LOWEST DETECTABLE LIMITS FOR URINE DRUG SCREEN Drug Class                     Cutoff (ng/mL) Amphetamine and metabolites    1000 Barbiturate and metabolites    200 Benzodiazepine                 500 Tricyclics and metabolites     300 Opiates and metabolites        300 Cocaine and metabolites        300 THC                            50   Troponin I     Status: Abnormal   Collection Time: 01/16/17  7:44 PM  Result Value Ref Range   Troponin I 0.37 (HH) <0.03 ng/mL    Comment: CRITICAL RESULT CALLED TO, READ BACK BY AND VERIFIED WITH: E GOGGIN,RN 01/17/16 2059 RHOLMES CORRECT DATE OF CALL 01/16/2017   Troponin I     Status: Abnormal   Collection Time: 01/17/17  1:17 AM  Result Value Ref Range   Troponin I 0.30 (HH) <0.03 ng/mL    Comment: CRITICAL VALUE NOTED.  VALUE IS CONSISTENT WITH PREVIOUSLY REPORTED AND CALLED VALUE.  Hemoglobin A1c     Status: None   Collection Time: 01/17/17  1:17 AM  Result Value Ref Range   Hgb A1c MFr Bld 4.9 4.8 - 5.6 %    Comment: (NOTE) Pre diabetes:          5.7%-6.4% Diabetes:              >6.4% Glycemic control for   <7.0% adults with diabetes    Mean Plasma Glucose 93.93 mg/dL    Comment: Performed at [facility] [address], [place], [place] [address]  HIV antibody     Status:  None   Collection Time: 01/17/17  1:17 AM  Result Value Ref Range   HIV Screen 4th Generation wRfx Non Reactive Non Reactive    Comment: (NOTE) Performed At: [facility] [place], [place] [id number] Rush Farmer MD VE:[id number]   Troponin I     Status: Abnormal   Collection Time: 01/17/17  5:31 AM  Result Value Ref Range   Troponin I 0.29 (HH) <0.03 ng/mL  Comment: CRITICAL VALUE NOTED.  VALUE IS CONSISTENT WITH PREVIOUSLY REPORTED AND CALLED VALUE.  Comprehensive metabolic panel     Status: Abnormal   Collection Time: 01/17/17  5:31 AM  Result Value Ref Range   Sodium 135 135 - 145 mmol/L   Potassium 5.6 (H) 3.5 - 5.1 mmol/L   Chloride 101 101 - 111 mmol/L   CO2 22 22 - 32 mmol/L   Glucose, Bld 115 (H) 65 - 99 mg/dL   BUN 118 (H) 6 - 20 mg/dL    Comment: RESULTS CONFIRMED BY MANUAL DILUTION   Creatinine, Ser 6.50 (H) 0.61 - 1.24 mg/dL   Calcium 8.3 (L) 8.9 - 10.3 mg/dL   Total Protein 6.4 (L) 6.5 - 8.1 g/dL   Albumin 2.7 (L) 3.5 - 5.0 g/dL   AST 22 15 - 41 U/L   ALT 56 17 - 63 U/L   Alkaline Phosphatase 46 38 - 126 U/L   Total Bilirubin 0.8 0.3 - 1.2 mg/dL   GFR calc non Af Amer 9 (L) >60 mL/min   GFR calc Af Amer 10 (L) >60 mL/min    Comment: (NOTE) The eGFR has been calculated using the CKD EPI equation. This calculation has not been validated in all clinical situations. eGFR's persistently <60 mL/min signify possible Chronic Kidney Disease.    Anion gap 12 5 - 15  CBC     Status: Abnormal   Collection Time: 01/17/17  5:31 AM  Result Value Ref Range   WBC 17.6 (H) 4.0 - 10.5 K/uL   RBC 4.69 4.22 - 5.81 MIL/uL   Hemoglobin 14.0 13.0 - 17.0 g/dL   HCT 40.1 39.0 - 52.0 %   MCV 85.5 78.0 - 100.0 fL   MCH 29.9 26.0 - 34.0 pg   MCHC 34.9 30.0 - 36.0 g/dL   RDW 13.4 11.5 - 15.5 %   Platelets 362 150 - 400 K/uL  Blood gas, arterial     Status: Abnormal   Collection Time: 01/17/17  2:40 PM  Result Value Ref Range   O2 Content 2.0 L/min    Delivery systems NASAL CANNULA    pH, Arterial 7.331 (L) 7.350 - 7.450   pCO2 arterial 42.5 32.0 - 48.0 mmHg   pO2, Arterial 91.3 83.0 - 108.0 mmHg   Bicarbonate 21.8 20.0 - 28.0 mmol/L   Acid-base deficit 3.4 (H) 0.0 - 2.0 mmol/L   O2 Saturation 95.9 %   Patient temperature 98.6    Collection site LEFT BRACHIAL    Drawn by [id number]    Sample type ARTERIAL DRAW    Allens test (pass/fail) PASS PASS   Dg Chest 2 View  Result Date: 01/16/2017 CLINICAL DATA:  Shortness of breath, BILATERAL lower extremity edema with weeping wounds, on oxygen, history hypertension, former smoker EXAM: CHEST  2 VIEW COMPARISON:  Non FINDINGS: Enlargement of cardiac silhouette with pulmonary vascular congestion. Rotated to the RIGHT. Subsegmental atelectasis LEFT mid lung and at both bases greater on LEFT. Minimal LEFT pleural effusion. No definite acute infiltrate, pleural effusion or pneumothorax. IMPRESSION: Enlargement of cardiac silhouette with vascular congestion. Scattered atelectasis greater on LEFT with minimal LEFT pleural effusion. Electronically Signed   By: Lavonia Dana M.D.   On: 01/16/2017 13:09   Ct Head Wo Contrast  Result Date: 01/16/2017 CLINICAL DATA:  Headache hypertension EXAM: CT HEAD WITHOUT CONTRAST TECHNIQUE: Contiguous axial images were obtained from the base of the skull through the vertex without intravenous contrast. COMPARISON:  None. FINDINGS: Motion degraded images  limit assessment. Brain: No evidence of acute large vascular territory infarction, hemorrhage, hydrocephalus, extra-axial collection or mass lesion/mass effect. No intraventricular hemorrhage. No effacement of the basal cisterns or fourth ventricle. Hyperdensity along the falx likely related to falcine calcifications, without eccentric or focal thickening to suggest hemorrhage. Minimal periventricular white matter hypodensities consistent with small vessel ischemic disease. Vascular: No hyperdense vessel or unexpected calcification.  Skull: Negative for fracture or focal lesion. Sinuses/Orbits: No acute finding. Other: None IMPRESSION: Limited by motion artifacts. Minimal small vessel ischemic disease. No acute intracranial abnormality. Electronically Signed   By: Ashley Royalty M.D.   On: 01/16/2017 18:09   US Renal  Result Date: 01/16/2017 CLINICAL DATA:  Acute renal failure.  History of hypertension. EXAM: RENAL / URINARY TRACT ULTRASOUND COMPLETE COMPARISON:  01/29/2015 FINDINGS: Right Kidney: Length: 9.9 cm. Echogenic renal parenchyma. No focal mass or hydronephrosis. Left Kidney: Length: 9.2 cm. Echogenic renal parenchyma. No focal mass or hydronephrosis. Bladder: Small amount of debris is noted within the bladder. Additional: Bilateral pleural effusions are noted. IMPRESSION: 1. Echogenic renal parenchyma bilaterally, compatible with the history of renal failure. No hydronephrosis. 2. Bilateral pleural effusions. Electronically Signed   By: Nolon Nations M.D.   On: 01/16/2017 18:47   Ct Tibia Fibula Left Wo Contrast  Result Date: 01/16/2017 CLINICAL DATA:  Cellulitis.  Evaluate for osteomyelitis. EXAM: CT OF THE LOWER LEFT EXTREMITY WITHOUT CONTRAST TECHNIQUE: Multidetector CT imaging of the lower left extremity was performed according to the standard protocol. COMPARISON:  None. FINDINGS: Bones/Joint/Cartilage No fracture or dislocation. Normal alignment. No joint effusion. Moderate medial femorotibial compartment joint space narrowing with marginal osteophytes and subchondral sclerosis. Mild lateral femorotibial compartment joint space narrowing with small marginal osteophytes. Small patellofemoral compartment marginal osteophytes. Subchondral cystic changes in the medial corner of the talar dome. Small anterior tibial plafond marginal osteophyte. No aggressive osseous lesion. No periosteal reaction or bone destruction. Ligaments Ligaments are suboptimally evaluated by CT. Muscles and Tendons Muscles are normal.  No muscle  atrophy. Soft tissue Severe soft tissue edema within the subcutaneous fat of the left lower leg. Severe soft tissue edema within the subcutaneous fat of the visualized portions of the right lower leg. No soft tissue mass. IMPRESSION: 1. Severe soft tissue edema within the subcutaneous fat of the left lower leg. Severe soft tissue edema within the subcutaneous fat of the visualized portions of the right lower leg. Differential considerations include severe cellulitis versus venous stasis. Electronically Signed   By: Kathreen Devoid   On: 01/16/2017 17:32    Pending Labs Unresulted Labs (From admission, onward)   Start     Ordered   01/18/17 0500  Protime-INR  Tomorrow morning,   R     01/17/17 1547   01/18/17 0500  CBC  Tomorrow morning,   R     01/17/17 1923   01/18/17 0500  Comprehensive metabolic panel  Tomorrow morning,   R     01/17/17 1923   01/17/17 1653  Hepatitis B surface antigen  Once,   R     01/17/17 1653   01/17/17 1653  Hepatitis B surface antibody  Once,   R     01/17/17 1653   01/17/17 1653  Hepatitis B core antibody, IgM  Once,   R     01/17/17 1653   01/16/17 1711  Culture, blood (routine x 2)  BLOOD CULTURE X 2,   R     01/16/17 1711   01/16/17 1612  TSH  Add-on,   R     01/16/17 1611   01/16/17 1612  Ammonia  Add-on,   R     01/16/17 1611      Vitals/Pain Today's Vitals   01/17/17 1800 01/17/17 1900 01/17/17 2000 01/17/17 2032  BP: 124/89 (!) 137/92 (!) 145/96 (!) 144/100  Pulse: (!) 56 (!) 59 (!) 56 60  Resp: 17 20 (!) 21 18  Temp:    (!) 97.5 F (36.4 C)  TempSrc:    Oral  SpO2: 100% 100% 99% 100%  Weight:      Height:      PainSc:        Isolation Precautions No active isolations  Medications Medications  hydrALAZINE (APRESOLINE) tablet 100 mg (100 mg Oral Given 01/17/17 1449)  furosemide (LASIX) 160 mg in dextrose 5 % 50 mL IVPB (0 mg Intravenous Stopped 01/17/17 1945)  carvedilol (COREG) tablet 25 mg (25 mg Oral Given 01/17/17 1618)  amLODipine  (NORVASC) tablet 10 mg (10 mg Oral Given 01/17/17 1057)  labetalol (NORMODYNE,TRANDATE) injection 10 mg (10 mg Intravenous Given 01/17/17 0551)  furosemide (LASIX) injection 40 mg (40 mg Intravenous Given 01/16/17 1407)  cloNIDine (CATAPRES) tablet 0.2 mg (0.2 mg Oral Given 01/16/17 1554)  labetalol (NORMODYNE,TRANDATE) injection 10 mg (10 mg Intravenous Given 01/16/17 1550)  vancomycin (VANCOCIN) 2,000 mg in sodium chloride 0.9 % 500 mL IVPB (0 mg Intravenous Stopped 01/16/17 2344)    Mobility non-ambulatory

## 2017-01-17 NOTE — Progress Notes (Signed)
  Echocardiogram 2D Echocardiogram has been performed.  Darlina Sicilian M 01/17/2017, 8:35 AM

## 2017-01-17 NOTE — ED Notes (Signed)
No respiratory or acute distress noted resting in bed with eyes closed no reaction to medication noted call light in reach.

## 2017-01-17 NOTE — ED Notes (Signed)
No respiratory or acute distress noted resting in bed no reaction to medication noted call light in reach.

## 2017-01-17 NOTE — ED Notes (Signed)
Paged on pager on call [place] that WBC elevated to 17.6 and potassium 5.6.

## 2017-01-17 NOTE — Progress Notes (Signed)
[facility] Progress Note  Subjective: no new c.o  Vitals:   01/17/17 0438 01/17/17 0530 01/17/17 0641 01/17/17 0901  BP: (!) 170/116 (!) 184/131 (!) 162/108 (!) 177/132  Pulse: 84 75 68 74  Resp: (!) 22 (!) 30 (!) 22   Temp:   97.8 F (36.6 C)   TempSrc:   Oral   SpO2: 96% 96% 95%   Weight:      Height:        Inpatient medications: . amLODipine  10 mg Oral Daily  . carvedilol  25 mg Oral BID WC  . hydrALAZINE  100 mg Oral Q8H   . furosemide Stopped (01/17/17 9373)   labetalol  Exam: Groggy, arouses easily, no resp distress +jvd Chest clear bilat RRR Abd soft no ascites Ext 3-4+ edema bilat, open skin wounds LLE NF, ox 3, gen'd weakness  Renal US > 9 cm kidneys, echogenic, loss of CM diff, no hydro      Impression: 1. CKD - lost to f/u.  Has progressive CKD, now likely ESRD, will need to start on dialysis.  Will consult [facility] for [facility] and perm access.  Left handed, save R arm. In meantime cont IV lasix 160 q 6 for vol overload, renal diet low K.  Await[facility] a [facility].  2. Severe HTN -w HTN'sive crisis, BP's 250/170 on presentation.  Improving w po/ IV meds. Contineu.  3. AMS - ABG ok, he is likely uremic and the high BP's may be an issue as well.  Able to respond to questions.    Plan - as above   Kelly Splinter MD [facility] pager [phone]   01/17/2017, 9:05 AM   Recent Labs  Lab 01/16/17 1249 01/16/17 1314 01/16/17 1316 01/17/17 0531  NA 135  --  136 135  K 5.4*  --  5.3* 5.6*  CL 102  --  102 101  CO2 21*  --   --  22  GLUCOSE 105*  --  101* 115*  BUN 118*  --  94* 118*  CREATININE 6.98*  --  7.00* 6.50*  CALCIUM 8.8*  --   --  8.3*  PHOS  --  7.9*  --   --    Recent Labs  Lab 01/16/17 1314 01/17/17 0531  AST 24 22  ALT 64* 56  ALKPHOS 48 46  BILITOT 1.0 0.8  PROT 6.6 6.4*  ALBUMIN 2.8* 2.7*   Recent Labs  Lab 01/16/17 1249 01/16/17 1316 01/17/17 0531  WBC 6.0  --  17.6*  NEUTROABS 5.1  --   --   HGB  14.1 14.3 14.0  HCT 39.8 42.0 40.1  MCV 85.4  --  85.5  PLT 370  --  362   Iron/TIBC/Ferritin/ %Sat    Component Value Date/Time   IRON 98 01/31/2015 1247   TIBC 217 (L) 01/31/2015 1247   FERRITIN 348 (H) 01/30/2015 1201   IRONPCTSAT 45 (H) 01/31/2015 1247

## 2017-01-17 NOTE — Progress Notes (Signed)
Bilateral ABIs completed. ABIs and Doppler waveforms are within normal limits. ABI RT 1.27 LT 1.33. [place] 01/17/2017 10:55 AM

## 2017-01-17 NOTE — ED Notes (Signed)
Called [facility] for transport 

## 2017-01-18 ENCOUNTER — Inpatient Hospital Stay (HOSPITAL_COMMUNITY): Payer: Medicaid Other

## 2017-01-18 ENCOUNTER — Encounter (HOSPITAL_COMMUNITY): Payer: Self-pay | Admitting: Interventional Radiology

## 2017-01-18 ENCOUNTER — Encounter (HOSPITAL_COMMUNITY)
Admission: EM | Disposition: A | Discharge status: Home / Self Care | Payer: Self-pay | Source: Home / Self Care | Attending: Internal Medicine

## 2017-01-18 DIAGNOSIS — R748 Abnormal levels of other serum enzymes: Secondary | ICD-10-CM

## 2017-01-18 DIAGNOSIS — I5041 Acute combined systolic (congestive) and diastolic (congestive) heart failure: Secondary | ICD-10-CM

## 2017-01-18 DIAGNOSIS — I161 Hypertensive emergency: Secondary | ICD-10-CM

## 2017-01-18 DIAGNOSIS — N185 Chronic kidney disease, stage 5: Secondary | ICD-10-CM

## 2017-01-18 HISTORY — PX: IR FLUORO GUIDE CV LINE RIGHT: IMG2283

## 2017-01-18 HISTORY — PX: IR US GUIDE VASC ACCESS RIGHT: IMG2390

## 2017-01-18 LAB — URIC ACID: Uric Acid, Serum: 13.5 mg/dL — ABNORMAL HIGH (ref 4.4–7.6)

## 2017-01-18 LAB — SEDIMENTATION RATE: Sed Rate: 11 mm/hr (ref 0–16)

## 2017-01-18 LAB — MRSA PCR SCREENING: MRSA by PCR: NEGATIVE

## 2017-01-18 LAB — C-REACTIVE PROTEIN: CRP: 7.3 mg/dL — AB (ref <1.0)

## 2017-01-18 LAB — PHOSPHORUS: PHOSPHORUS: 5.9 mg/dL — AB (ref 2.5–4.6)

## 2017-01-18 IMAGING — US IR US GUIDE VASC ACCESS RIGHT
2 series · 2 of 2 positions shown · non-contrast
Comparison: none

CLINICAL DATA: Renal failure, needs venous access for hemodialysis

EXAM:
EXAM
RIGHT IJ CATHETER PLACEMENT UNDER ULTRASOUND AND FLUOROSCOPIC
GUIDANCE
TECHNIQUE: The procedure, risks (including but not limited to bleeding,
infection, organ damage, pneumothorax), benefits, and alternatives
were explained to the patient. Questions regarding the procedure
were encouraged and answered. The patient understands and consents
to the procedure. Patency of the right IJ vein was confirmed with
ultrasound with image documentation. An appropriate skin site was
determined. Skin site was marked. Region was prepped using maximum
barrier technique including cap and mask, sterile gown, sterile
gloves, large sterile sheet, and Chlorhexidine as cutaneous
antisepsis. The region was infiltrated locally with 1% lidocaine.
Under real-time ultrasound guidance, the right IJ vein was accessed
with a 21 gauge needle; the needle tip within the vein was confirmed
with ultrasound image documentation. The needle exchanged over a 018
guidewire for vascular dilator which allowed advancement of a 20 cm
Mahurkar catheter. This was positioned with the tip at the
cavoatrial junction. Spot chest radiograph shows good positioning
and no pneumothorax. Catheter was flushed and sutured externally
with 0-Prolene sutures. Patient tolerated the procedure well.
FLUOROSCOPY TIME:  Less than 6 seconds
COMPLICATIONS:
COMPLICATIONS
none

[Series 1: ir us guide vasc access right · 1 of 1 slices shown]
[im 1/1]
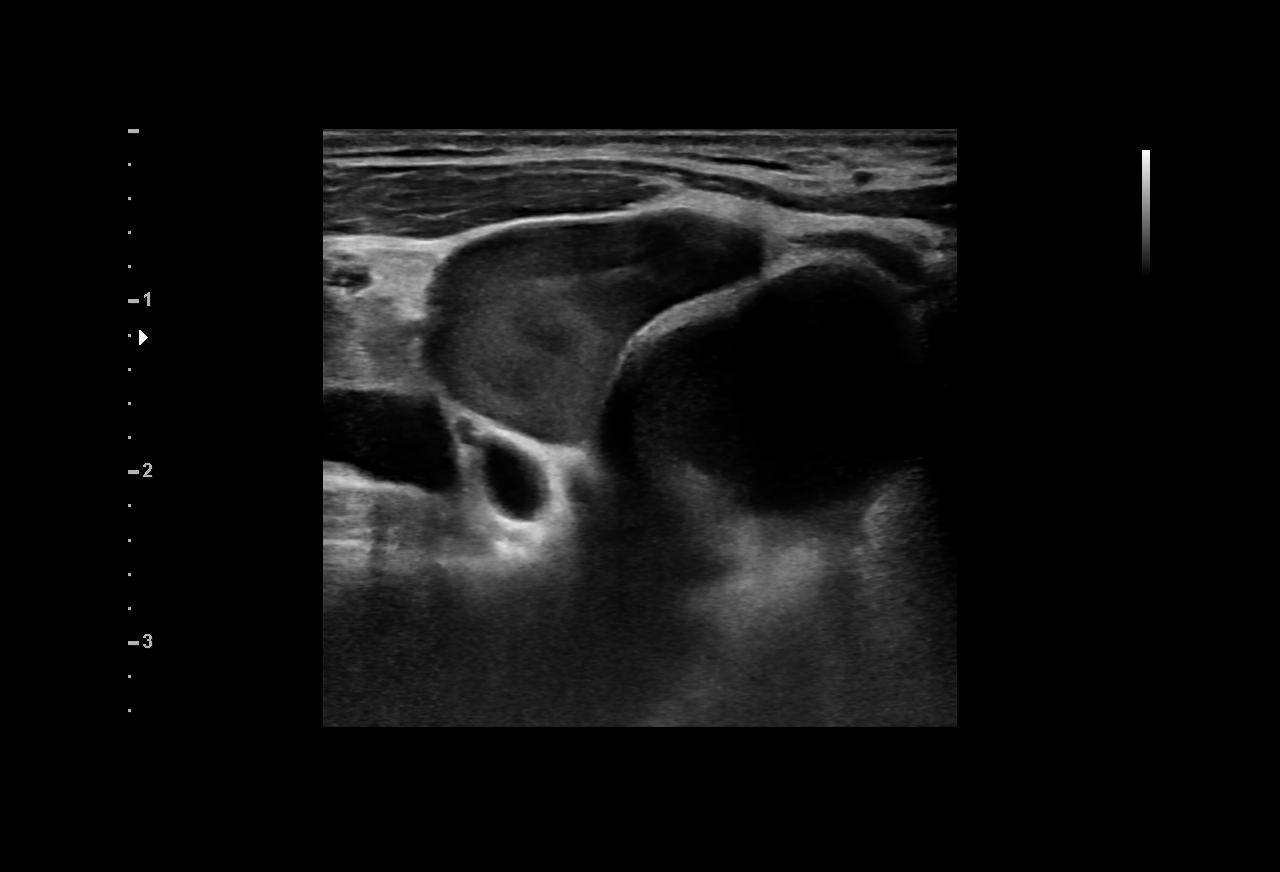

[Series 300: dsa body · 1 of 1 slices shown]
[im 1/1]
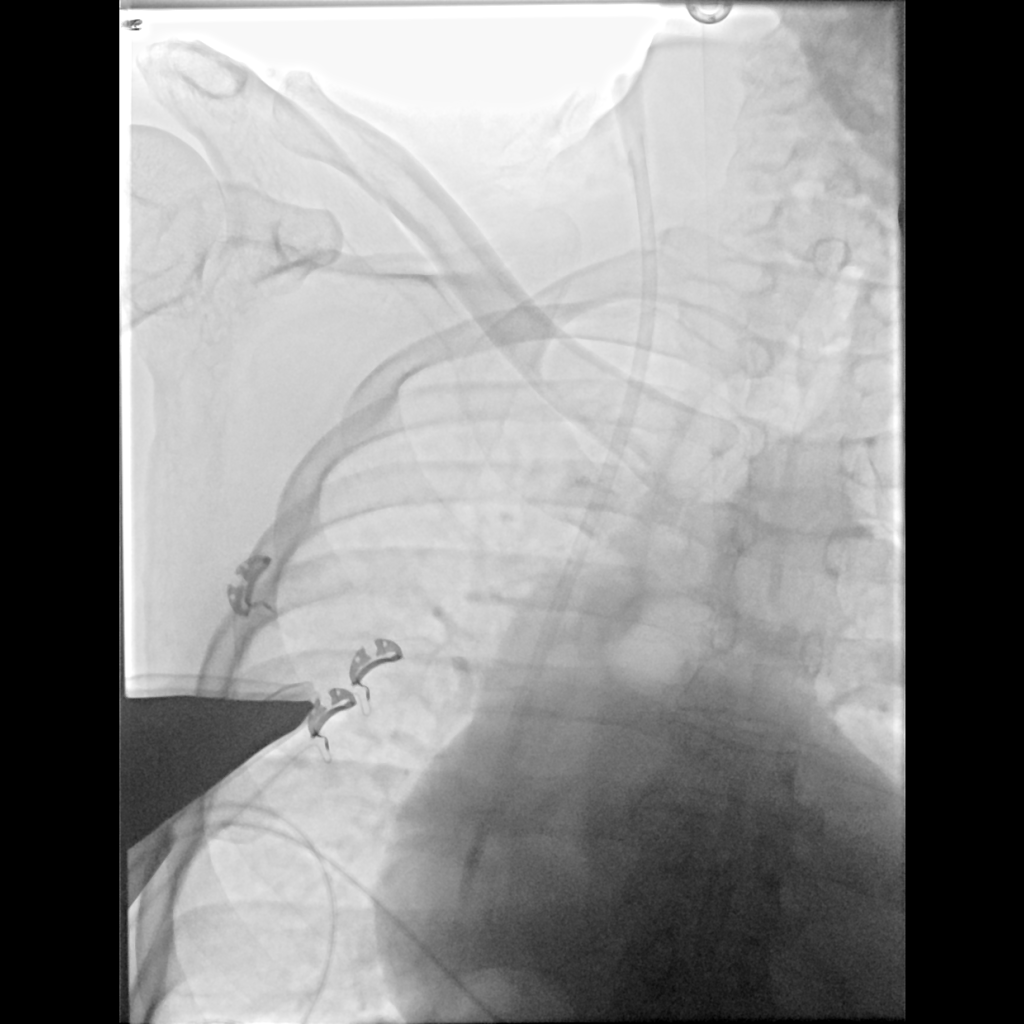

[2 of 2 positions shown; findings below may reference images not displayed]

IMPRESSION: 1. Technically successful right IJ Mahurkar catheter placement.

## 2017-01-18 SURGERY — INSERTION OF DIALYSIS CATHETER
Anesthesia: Monitor Anesthesia Care | Laterality: Right

## 2017-01-18 MED ORDER — LIDOCAINE HCL (PF) 1 % IJ SOLN: 5.0000 mL | INTRAMUSCULAR | Status: DC | PRN

## 2017-01-18 MED ORDER — SODIUM CHLORIDE 0.9 % IV SOLN: 100.0000 mL | INTRAVENOUS | Status: DC | PRN

## 2017-01-18 MED ORDER — LIDOCAINE HCL 1 % IJ SOLN
INTRAMUSCULAR | Status: AC
  Filled 2017-01-18: qty 20

## 2017-01-18 MED ORDER — HEPARIN SODIUM (PORCINE) 1000 UNIT/ML IJ SOLN
INTRAMUSCULAR | Status: DC | PRN
  Administered 2017-01-18: 2.8 mL

## 2017-01-18 MED ORDER — ACETAMINOPHEN 325 MG PO TABS
650.0000 mg | ORAL_TABLET | ORAL | Status: DC | PRN
  Administered 2017-01-20: 650 mg via ORAL
  Filled 2017-01-18: qty 2

## 2017-01-18 MED ORDER — HEPARIN SODIUM (PORCINE) 1000 UNIT/ML DIALYSIS: 1000.0000 [IU] | INTRAMUSCULAR | Status: DC | PRN

## 2017-01-18 MED ORDER — ALTEPLASE 2 MG IJ SOLR: 2.0000 mg | Freq: Once | INTRAMUSCULAR | Status: DC | PRN

## 2017-01-18 MED ORDER — HYDROMORPHONE HCL 1 MG/ML IJ SOLN: 1.0000 mg | INTRAMUSCULAR | Status: DC | PRN

## 2017-01-18 MED ORDER — LIDOCAINE HCL (PF) 1 % IJ SOLN
INTRAMUSCULAR | Status: DC | PRN
  Administered 2017-01-18: 5 mL

## 2017-01-18 MED ORDER — ISOSORBIDE MONONITRATE ER 60 MG PO TB24
60.0000 mg | ORAL_TABLET | Freq: Every day | ORAL | Status: DC
  Administered 2017-01-21 – 2017-01-26 (×6): 60 mg via ORAL
  Filled 2017-01-18 (×7): qty 1

## 2017-01-18 MED ORDER — OXYCODONE-ACETAMINOPHEN 5-325 MG PO TABS
1.0000 | ORAL_TABLET | ORAL | Status: DC | PRN
  Administered 2017-01-18 – 2017-01-19 (×3): 1 via ORAL
  Filled 2017-01-18 (×3): qty 1

## 2017-01-18 MED ORDER — PRO-STAT SUGAR FREE PO LIQD
30.0000 mL | Freq: Two times a day (BID) | ORAL | Status: DC
  Administered 2017-01-18 – 2017-01-23 (×10): 30 mL via ORAL
  Filled 2017-01-18 (×12): qty 30

## 2017-01-18 MED ORDER — HEPARIN SODIUM (PORCINE) 1000 UNIT/ML IJ SOLN
INTRAMUSCULAR | Status: AC
  Filled 2017-01-18: qty 1

## 2017-01-18 MED ORDER — LIDOCAINE-PRILOCAINE 2.5-2.5 % EX CREA
1.0000 "application " | TOPICAL_CREAM | CUTANEOUS | Status: DC | PRN
  Filled 2017-01-18: qty 5

## 2017-01-18 MED ORDER — PENTAFLUOROPROP-TETRAFLUOROETH EX AERO
1.0000 "application " | INHALATION_SPRAY | CUTANEOUS | Status: DC | PRN
  Filled 2017-01-18: qty 30

## 2017-01-18 NOTE — Progress Notes (Signed)
Initial Nutrition Assessment  DOCUMENTATION CODES:   Not applicable  INTERVENTION:   -30 ml Prostat BID, each supplement provides 100 kcals and 15 grams protein  NUTRITION DIAGNOSIS:   Increased nutrient needs related to wound healing as evidenced by estimated needs.  GOAL:   Patient will meet greater than or equal to 90% of their needs  MONITOR:   PO intake, Supplement acceptance, Labs, Weight trends, Skin, I & O's  REASON FOR ASSESSMENT:   Consult Wound healing  ASSESSMENT:   Jason [name] is a 57 y.o. male with medical history significant of hypertension, chronic kidney disease, anemia, inguinal hernia presenting with worsening shortness of breath, lower extremity edema, and altered mental status.  Pt admitted with hypertensive emergency.   1/8- s/p HD cath placed  Reviewed CWOCN note from 01/18/16; pt with RLE full thickness wound and LLE full and partial thickness tissue loss.   Case discussed with RN, who reports pt is very resistant to start HD, however, agreed to HD today after conversation with cardiologist. Jason [name] if pt will need long term HD at this point. Diet was just advanced to renal.   Pt sleeping soundly at time of visit and did not respond when name was called. Unable to obtain further nutrition hx at this time.   Noted UBW around 208#. Noted wt gain, which is likely related to edema.   Labs reviewed.   NUTRITION - FOCUSED PHYSICAL EXAM:    Most Recent Value  Orbital Region  No depletion  Upper Arm Region  No depletion  Thoracic and Lumbar Region  Unable to assess  Buccal Region  No depletion  Temple Region  No depletion  Clavicle Bone Region  Unable to assess  Clavicle and Acromion Bone Region  Unable to assess  Scapular Bone Region  Unable to assess  Dorsal Hand  No depletion  Patellar Region  Unable to assess  Anterior Thigh Region  Unable to assess  Posterior Calf Region  Unable to assess  Edema (RD Assessment)  Moderate  Hair  Reviewed   Eyes  Reviewed  Mouth  Reviewed  Nails  Reviewed       Diet Order:  Diet renal with fluid restriction Fluid restriction: 1200 mL Fluid; Room service appropriate? Yes; Fluid consistency: Thin  EDUCATION NEEDS:   No education needs have been identified at this time  Skin:  Skin Assessment: Skin Integrity Issues: Skin Integrity Issues:: Other (Comment) Other: RLE full thickness wounds, LLE full and partial thickness wounds  Last BM:  01/19/16  Height:   Ht Readings from Last 1 Encounters:  01/17/17 6\' 2"  (1.88 m)    Weight:   Wt Readings from Last 1 Encounters:  01/17/17 218 lb (98.9 kg)    Ideal Body Weight:  86.4 kg  BMI:  Body mass index is 27.99 kg/m.  Estimated Nutritional Needs:   Kcal:  2200-2400  Protein:  130-145 grams  Fluid:  per MD    Jason [name], RD, LDN, CDE Pager: [phone] After hours Pager: [phone]

## 2017-01-18 NOTE — Progress Notes (Signed)
Chief Complaint: Patient was seen in consultation today for dialysis catheter placement at the request of Dr. Jonnie Finner  Referring Physician(s): Dr. Jonnie Finner  Supervising Physician: Arne Cleveland  Patient Status: [facility] - In-pt  History of Present Illness: Jason [name] is a 57 y.o. male with AKI. He is in need of HD and IR is asked to place HD catheter. Chart reviewed, pt uremic with altered mental status. He also has cellulitis of the right leg with rising WBC. Blood cultures pending.   Past Medical History:  Diagnosis Date  . AKI (acute kidney injury) ([place]) 01/2015  . Hypertension     Past Surgical History:  Procedure Laterality Date  . TOE SURGERY    . TONSILLECTOMY      Allergies: Lisinopril  Medications:  Current Facility-Administered Medications:  .  acetaminophen (TYLENOL) tablet 650 mg, 650 mg, Oral, Q4H PRN, Rai, Ripudeep K, MD .  amLODipine (NORVASC) tablet 10 mg, 10 mg, Oral, Daily, Sanford, Ryan B, MD, 10 mg at 01/17/17 1057 .  carvedilol (COREG) tablet 25 mg, 25 mg, Oral, BID WC, Sanford, Ryan B, MD, 25 mg at 01/17/17 1618 .  furosemide (LASIX) 160 mg in dextrose 5 % 50 mL IVPB, 160 mg, Intravenous, Q6H, [name], MD, Stopped at 01/18/17 6433 .  hydrALAZINE (APRESOLINE) tablet 100 mg, 100 mg, Oral, Q8H, Elodia Florence., MD, Stopped at 01/18/17 [phone] .  HYDROmorphone (DILAUDID) injection 1 mg, 1 mg, Intravenous, Q4H PRN, Rai, Ripudeep K, MD .  labetalol (NORMODYNE,TRANDATE) injection 10 mg, 10 mg, Intravenous, Q2H PRN, Elodia Florence., MD, 10 mg at 01/17/17 0551 .  oxyCODONE-acetaminophen (PERCOCET/ROXICET) 5-325 MG per tablet 1 tablet, 1 tablet, Oral, Q4H PRN, Rai, Ripudeep K, MD, 1 tablet at 01/18/17 0815    Family History  Problem Relation Age of Onset  . Heart attack Mother 50       CABG hx  . Heart disease Mother   . Hypertension Mother   . Healthy Father   . Healthy Brother     Social History   Socioeconomic History  .  Marital status: Single    Spouse name: None  . Number of children: None  . Years of education: None  . Highest education level: None  Social Needs  . Financial resource strain: None  . Food insecurity - worry: None  . Food insecurity - inability: None  . Transportation needs - medical: None  . Transportation needs - non-medical: None  Occupational History  . None  Tobacco Use  . Smoking status: Former Research scientist (life sciences)  . Smokeless tobacco: Never Used  . Tobacco comment: quit smoking in 1997  Substance and Sexual Activity  . Alcohol use: No  . Drug use: No  . Sexual activity: None  Other Topics Concern  . None  Social History Narrative  . None     Review of Systems: A 12 point ROS discussed and pertinent positives are indicated in the HPI above.  All other systems are negative.  Review of Systems  Vital Signs: BP (!) 147/98 (BP Location: Right Arm)   Pulse 67   Temp 98.3 F (36.8 C) (Oral)   Resp (!) 26   Ht 6\' 2"  (1.88 m)   Wt 218 lb (98.9 kg)   SpO2 98%   BMI 27.99 kg/m   Physical Exam  Constitutional: He appears well-developed. No distress.  HENT:  Head: Normocephalic.  Mouth/Throat: Oropharynx is clear and moist.  Neck: Normal range of motion. No JVD present.  No tracheal deviation present.  Cardiovascular: Normal rate, regular rhythm and normal heart sounds.  Pulmonary/Chest: Effort normal and breath sounds normal. No respiratory distress.  Neurological:  Difficult to awaken and maintain conversation. He remains drowsy and is not coherent      Imaging: Dg Chest 2 View  Result Date: 01/16/2017 CLINICAL DATA:  Shortness of breath, BILATERAL lower extremity edema with weeping wounds, on oxygen, history hypertension, former smoker EXAM: CHEST  2 VIEW COMPARISON:  Non FINDINGS: Enlargement of cardiac silhouette with pulmonary vascular congestion. Rotated to the RIGHT. Subsegmental atelectasis LEFT mid lung and at both bases greater on LEFT. Minimal LEFT pleural  effusion. No definite acute infiltrate, pleural effusion or pneumothorax. IMPRESSION: Enlargement of cardiac silhouette with vascular congestion. Scattered atelectasis greater on LEFT with minimal LEFT pleural effusion. Electronically Signed   By: Lavonia Dana M.D.   On: 01/16/2017 13:09   Ct Head Wo Contrast  Result Date: 01/16/2017 CLINICAL DATA:  Headache hypertension EXAM: CT HEAD WITHOUT CONTRAST TECHNIQUE: Contiguous axial images were obtained from the base of the skull through the vertex without intravenous contrast. COMPARISON:  None. FINDINGS: Motion degraded images limit assessment. Brain: No evidence of acute large vascular territory infarction, hemorrhage, hydrocephalus, extra-axial collection or mass lesion/mass effect. No intraventricular hemorrhage. No effacement of the basal cisterns or fourth ventricle. Hyperdensity along the falx likely related to falcine calcifications, without eccentric or focal thickening to suggest hemorrhage. Minimal periventricular white matter hypodensities consistent with small vessel ischemic disease. Vascular: No hyperdense vessel or unexpected calcification. Skull: Negative for fracture or focal lesion. Sinuses/Orbits: No acute finding. Other: None IMPRESSION: Limited by motion artifacts. Minimal small vessel ischemic disease. No acute intracranial abnormality. Electronically Signed   By: Ashley Royalty M.D.   On: 01/16/2017 18:09   US Renal  Result Date: 01/16/2017 CLINICAL DATA:  Acute renal failure.  History of hypertension. EXAM: RENAL / URINARY TRACT ULTRASOUND COMPLETE COMPARISON:  01/29/2015 FINDINGS: Right Kidney: Length: 9.9 cm. Echogenic renal parenchyma. No focal mass or hydronephrosis. Left Kidney: Length: 9.2 cm. Echogenic renal parenchyma. No focal mass or hydronephrosis. Bladder: Small amount of debris is noted within the bladder. Additional: Bilateral pleural effusions are noted. IMPRESSION: 1. Echogenic renal parenchyma bilaterally, compatible with the  history of renal failure. No hydronephrosis. 2. Bilateral pleural effusions. Electronically Signed   By: Nolon Nations M.D.   On: 01/16/2017 18:47   Ct Tibia Fibula Left Wo Contrast  Result Date: 01/16/2017 CLINICAL DATA:  Cellulitis.  Evaluate for osteomyelitis. EXAM: CT OF THE LOWER LEFT EXTREMITY WITHOUT CONTRAST TECHNIQUE: Multidetector CT imaging of the lower left extremity was performed according to the standard protocol. COMPARISON:  None. FINDINGS: Bones/Joint/Cartilage No fracture or dislocation. Normal alignment. No joint effusion. Moderate medial femorotibial compartment joint space narrowing with marginal osteophytes and subchondral sclerosis. Mild lateral femorotibial compartment joint space narrowing with small marginal osteophytes. Small patellofemoral compartment marginal osteophytes. Subchondral cystic changes in the medial corner of the talar dome. Small anterior tibial plafond marginal osteophyte. No aggressive osseous lesion. No periosteal reaction or bone destruction. Ligaments Ligaments are suboptimally evaluated by CT. Muscles and Tendons Muscles are normal.  No muscle atrophy. Soft tissue Severe soft tissue edema within the subcutaneous fat of the left lower leg. Severe soft tissue edema within the subcutaneous fat of the visualized portions of the right lower leg. No soft tissue mass. IMPRESSION: 1. Severe soft tissue edema within the subcutaneous fat of the left lower leg. Severe soft tissue edema within the subcutaneous fat  of the visualized portions of the right lower leg. Differential considerations include severe cellulitis versus venous stasis. Electronically Signed   By: Kathreen Devoid   On: 01/16/2017 17:32    Labs:  CBC: Recent Labs    02/21/16 2119 04/04/16 1639 01/16/17 1249 01/16/17 1316 01/17/17 0531  WBC 6.8  --  6.0  --  17.6*  HGB 11.2* 12.2* 14.1 14.3 14.0  HCT 33.2* 36.0* 39.8 42.0 40.1  PLT 253  --  370  --  362    COAGS: No results for input(s):  INR, APTT in the last 8760 hours.  BMP: Recent Labs    02/21/16 2119 04/04/16 1639 01/16/17 1249 01/16/17 1316 01/17/17 0531  NA 138 142 135 136 135  K 2.8* 3.0* 5.4* 5.3* 5.6*  CL 99* 101 102 102 101  CO2 30  --  21*  --  22  GLUCOSE 83 85 105* 101* 115*  BUN 34* 34* 118* 94* 118*  CALCIUM 8.7*  --  8.8*  --  8.3*  CREATININE 2.98* 3.20* 6.98* 7.00* 6.50*  GFRNONAA 22*  --  8*  --  9*  GFRAA 26*  --  9*  --  10*    LIVER FUNCTION TESTS: Recent Labs    01/16/17 1314 01/17/17 0531  BILITOT 1.0 0.8  AST 24 22  ALT 64* 56  ALKPHOS 48 46  PROT 6.6 6.4*  ALBUMIN 2.8* 2.7*    TUMOR MARKERS: No results for input(s): AFPTM, CEA, CA199, CHROMGRNA in the last 8760 hours.  Assessment and Plan: AKI with altered mental status Needs HD Cellulitis of (R)LE, WBC 17k, blood cultures pending. Given acute infectious concerns, plan for temp cath placement Discussed with daughter Kia. Risks and benefits discussed with the patient's daughter including, but not limited to bleeding, infection, vascular injury, pneumothorax which may require chest tube placement, air embolism or even death All questions were answered. Consent signed and in chart.    Thank you for this interesting consult.  I greatly enjoyed meeting Louis Ivery and look forward to participating in their care.  A copy of this report was sent to the requesting provider on this date.  Electronically Signed: Ascencion Dike, PA-C 01/18/2017, 9:37 AM   I spent a total of 20 minutes in face to face in clinical consultation, greater than 50% of which was counseling/coordinating care for hemodialysis catheter placement

## 2017-01-18 NOTE — Care Management Note (Addendum)
Case Management Note  Patient Details  Name: Jason [name] MRN: [id number] Date of Birth: Oct 07, 1960  Subjective/Objective:     Pt admitted with cellulitis, hypertensive emergency, worsening RF  and SOB.                Action/Plan:  PTA from home.  ESRD not yet on outpt HD - however it has been determined that pt will now require perm HD - pt will need to be clipped.  Pt will need appt at [facility] and possibly [facility] at discharge.  CM will continue to follow for discharge needs   Expected Discharge Date:  (unknown)               Expected Discharge Plan:  Home/Self Care  In-House Referral:  Clinical Social Work  Discharge planning Services  CM Consult  Post Acute Care Choice:    Choice offered to:  Patient  DME Arranged:    DME Agency:     HH Arranged:    [place] Agency:     Status of Service:  In process, will continue to follow  If discussed at Long Length of Stay Meetings, dates discussed:    Additional Comments:  Maryclare Labrador, RN 01/18/2017, 1:59 PM

## 2017-01-18 NOTE — Consult Note (Signed)
[facility]ist [facility] [place]  Patient name: Jason [name] MRN: [id number] DOB: Mar 17, 1960 Sex: male    HPI: Jason [name] is a 57 y.o. male in for discussion of hemodialysis access.  Patient was admitted with hypertensive emergency and severe volume overload to [facility].  Was transferred to [facility] and we are consulted for access.  He was on the schedule today catheter and access but was felt by nephrology to be to sick for surgery.  He will have a catheter placed by interventional radiology and we will continue discussion of permanent access.  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  He reports that he has done research into natural treatments for kidney disease and she is to proceed this route.  I explained our role is for technically providing access if he goes on hemodialysis.  He is left-handed.  He also has massive edema of both lower extremities with some skin breakdown resulting from this.  Past Medical History:  Diagnosis Date  . AKI (acute kidney injury) ([place]) 01/2015  . Hypertension     Family History  Problem Relation Age of Onset  . Heart attack Mother 67       CABG hx  . Heart disease Mother   . Hypertension Mother   . Healthy Father   . Healthy Brother     SOCIAL HISTORY: Social History   Tobacco Use  . Smoking status: Former Research scientist (life sciences)  . Smokeless tobacco: Never Used  . Tobacco comment: quit smoking in 1997  Substance Use Topics  . Alcohol use: No    Allergies  Allergen Reactions  . Lisinopril Cough    Current Facility-Administered Medications  Medication Dose Route Frequency Provider Last Rate Last Dose  . amLODipine (NORVASC) tablet 10 mg  10 mg Oral Daily Pearson Grippe B, MD   10 mg at 01/17/17 1057  . carvedilol (COREG) tablet 25 mg  25 mg Oral BID WC Pearson Grippe B, MD   25 mg at 01/17/17 1618  . furosemide (LASIX) 160 mg in dextrose 5 % 50 mL IVPB  160 mg  Intravenous Q6H Pearson Grippe B, MD 66 mL/hr at 01/18/17 0622 160 mg at 01/18/17 6195  . hydrALAZINE (APRESOLINE) tablet 100 mg  100 mg Oral Q8H Elodia Florence., MD   Stopped at 01/18/17 [phone]  . labetalol (NORMODYNE,TRANDATE) injection 10 mg  10 mg Intravenous Q2H PRN Elodia Florence., MD   10 mg at 01/17/17 0551    REVIEW OF SYSTEMS:  [X]  denotes positive finding, [ ]  denotes negative finding Cardiac  Comments:  Chest pain or chest pressure:    Shortness of breath upon exertion:    Short of breath when lying flat:    Irregular heart rhythm:        Vascular    Pain in calf, thigh, or hip brought on by ambulation:    Pain in feet at night that wakes you up from your sleep:     Blood clot in your veins:    Leg swelling:  x         PHYSICAL EXAM: Vitals:   01/17/17 2354 01/18/17 0015 01/18/17 0054 01/18/17 0447  BP:   (!) 138/93 (!) 143/105  Pulse: 65  62 66  Resp: 18 (!) 24 19 (!) 25  Temp: (!) 97.3 F (36.3 C)   98.3 F (36.8 C)  TempSrc: Oral   Oral  SpO2: 99% 98% 96% 98%  Weight:      Height:        GENERAL: The patient is a well-nourished male, in no acute distress. The vital signs are documented above. CARDIOVASCULAR: Palpable radial pulses bilaterally.  Massive edema from his knees distally into his feet.  Dressings intact.  2+ radial pulses PULMONARY: There is good air exchange  MUSCULOSKELETAL: There are no major deformities or cyanosis. NEUROLOGIC: No focal weakness or paresthesias are detected. SKIN: There are no ulcers or rashes noted. PSYCHIATRIC: The patient has a normal affect.  DATA:  Noninvasive studies yesterday revealed normal ankle arm index bilaterally.  MEDICAL ISSUES: Patient's right arm is being saved for eventual access.  He does not have large surface veins.  Vein map pending.  We will follow along and be available for access if he agrees to long-term h hemodialysis.    Rosetta Posner, MD FACS [facility]  [facility] Tel [phone] Pager [phone]

## 2017-01-18 NOTE — Progress Notes (Signed)
After cardiology spoke with patient, he is now agreeing to go to HD. Rai, MD updated and asked for orders about a diet per patient request.

## 2017-01-18 NOTE — Progress Notes (Signed)
Patient returned from getting HD cath placed. Pt stating that medical team is pushing him to dialysis and that he doesn't want to go. States that he has a naturopathic doctor that states he can keep him from needing dialysis. RN informed patient that we would not force him to do anything he doesn't want. Justin Mend, MD and Tana Coast, MD updated. RN will continue to monitor patient.

## 2017-01-18 NOTE — Progress Notes (Signed)
Patient states he does not want to wear CPAP for tonight. RT informed patient if he changes his mind have RN contact RT.

## 2017-01-18 NOTE — Progress Notes (Signed)
Pharmacy Antibiotic Note  Jason [name] is a 57 y.o. male admitted on 01/16/2017 with cellulitis.  Pharmacy has been consulted for vancomycin dosing.  19 yom admitted with acute CHF in setting for acute on chronic kidney injury requiring hemodialysis. Found to have massive peripheral edema with CT showing severe soft tissue edema. WBC 17.6. CRP is 7.3, with sed rate 11. Afebrile. Received 2 gram vancomycin on 1/6. Has produced ~1 L since loading dose. Plan to start HD today.  Anticipate even with some urine production since dose and anticipating new start HD schedule, will be ~18 following first HD session. Will hold on giving dose today. Will plan for new dose following 2nd HD session.   Plan: No vancomycin following first HD session Follow along with HD plan for future dosing Monitor renal plan, cx results, clinical picture, and VR as needed   Height: 6\' 2"  (188 cm) Weight: 218 lb (98.9 kg) IBW/kg (Calculated) : 82.2  Temp (24hrs), Avg:97.9 F (36.6 C), Min:97.3 F (36.3 C), Max:98.3 F (36.8 C)  Recent Labs  Lab 01/16/17 1249 01/16/17 1316 01/17/17 0531  WBC 6.0  --  17.6*  CREATININE 6.98* 7.00* 6.50*    Estimated Creatinine Clearance: 16.1 mL/min (A) (by C-G formula based on SCr of 6.5 mg/dL (H)).    Allergies  Allergen Reactions  . Lisinopril Cough    Antimicrobials this admission: Vancomycin 1/6 >>   Dose adjustments this admission: N/A  Microbiology results: 1/6 BCx: sent 1/7 MRSA PCR: negative  Thank you for allowing pharmacy to be a part of this patient's care.  Doylene Canard, PharmD Clinical Pharmacist  Pager: [phone] Clinical Phone for 01/18/2017 until 3:30pm: [phone] If after 3:30pm, please call main pharmacy at [phone] 01/18/2017 11:51 AM

## 2017-01-18 NOTE — Consult Note (Signed)
Cardiology Consultation:   Patient ID: Eliazar Olivar; [id number]; Dec 20, 1960   Admit date: 01/16/2017 Date of Consult: 01/18/2017  Primary Care Provider: Wandra Mannan, NP Primary Cardiologist: New to [facility] - Dr. [name]   Patient Profile:   Daved Mcfann is a 57 y.o. male with PMH of HTN, CKD, and anemia who is being seen today for the evaluation of an elevated troponin and abnormal EKG at the request of Dr. Tana Coast.  History of Present Illness:   Mr. Christoffel  is a 57 y.o. male with PMH of HTN, CKD, and anemia who presented to [facility] ED 01/16/17 with worsening SOB, LE edema, and AMS. Transferred to [facility] for further management of hypertensive emergency, ESRD, and heart failure.   History limited due to patient's somnolence post catheter placement with IR. Patient states he was in his usual state of health until 2 weeks ago when he noticed progressively worsening SOB and LE edema. He states he had been unable to obtain his medications due to cost. States he experienced SOB at rest and DOE. Denies having this issue in the past. Noted some orthopnea but is able to sleep on 1 pillow. Denied PND, chest pain at rest or with exertion, palpitations, cough, fevers, abdominal pain.    Hospital course: BP improved after receiving short term nitro gtt - remains elevated on po amlodipine, coreg, and hydral with prn labetalol. Labs notable for BNP >4500, Cr 7.0 (previously 3.2 <38yr ago), Trop 0.43>>0.29. EKG non-ischemic, NSR, low voltage, Qtc 495. Nephrology consulted for ESRD with plans to initiate HD, also providing diuresis recs (IV lasix 160mg  q6h). Plans for IR placement of temp cath for HD today.  Past Medical History:  Diagnosis Date  . AKI (acute kidney injury) ([place]) 01/2015  . Hypertension     Past Surgical History:  Procedure Laterality Date  . TOE SURGERY    . TONSILLECTOMY       Home Medications:  Prior to Admission medications   Medication Sig Start Date End Date Taking? Authorizing  Provider  Multiple Vitamin (MULTIVITAMIN WITH MINERALS) TABS tablet Take 1 tablet by mouth daily.   Yes [provider]  amLODipine (NORVASC) 10 MG tablet Take 1 tablet (10 mg total) by mouth at bedtime. Patient not taking: Reported on 01/16/2017 02/22/16   [name], MD  carvedilol (COREG) 6.25 MG tablet Take 1 tablet (6.25 mg total) by mouth 2 (two) times daily with a meal. Patient not taking: Reported on 01/16/2017 04/04/16   [name], PA-C  cloNIDine (CATAPRES) 0.2 MG tablet Take 1 tablet (0.2 mg total) by mouth 2 (two) times daily. Patient not taking: Reported on 01/16/2017 05/03/16   [name], MD  hydrALAZINE (APRESOLINE) 100 MG tablet Take 1 tablet (100 mg total) by mouth every 8 (eight) hours. Patient not taking: Reported on 01/16/2017 02/22/16   [name], MD  LORazepam (ATIVAN) 1 MG tablet Take 1 tablet (1 mg total) by mouth every 8 (eight) hours as needed for anxiety. Patient not taking: Reported on 01/16/2017 05/03/16   [name], MD    Inpatient Medications: Scheduled Meds: . amLODipine  10 mg Oral Daily  . carvedilol  25 mg Oral BID WC  . hydrALAZINE  100 mg Oral Q8H   Continuous Infusions: . furosemide Stopped (01/18/17 0722)   PRN Meds: acetaminophen, HYDROmorphone (DILAUDID) injection, labetalol, oxyCODONE-acetaminophen  Allergies:    Allergies  Allergen Reactions  . Lisinopril Cough    Social History:   Social History  Socioeconomic History  . Marital status: Single    Spouse name: Not on file  . Number of children: Not on file  . Years of education: Not on file  . Highest education level: Not on file  Social Needs  . Financial resource strain: Not on file  . Food insecurity - worry: Not on file  . Food insecurity - inability: Not on file  . Transportation needs - medical: Not on file  . Transportation needs - non-medical: Not on file  Occupational History  . Not on file  Tobacco Use  . Smoking status:  Former Research scientist (life sciences)  . Smokeless tobacco: Never Used  . Tobacco comment: quit smoking in 1997  Substance and Sexual Activity  . Alcohol use: No  . Drug use: No  . Sexual activity: Not on file  Other Topics Concern  . Not on file  Social History Narrative  . Not on file    Family History:    Family History  Problem Relation Age of Onset  . Heart attack Mother 62       CABG hx  . Heart disease Mother   . Hypertension Mother   . Healthy Father   . Healthy Brother      ROS:  Please see the history of present illness.   All other ROS reviewed and negative.     Physical Exam/Data:   Vitals:   01/18/17 0015 01/18/17 0054 01/18/17 0447 01/18/17 0800  BP:  (!) 138/93 (!) 143/105 (!) 147/98  Pulse:  62 66 67  Resp: (!) 24 19 (!) 25 (!) 26  Temp:   98.3 F (36.8 C) 98.3 F (36.8 C)  TempSrc:   Oral Oral  SpO2: 98% 96% 98% 98%  Weight:      Height:        Intake/Output Summary (Last 24 hours) at 01/18/2017 0933 Last data filed at 01/18/2017 0500 Gross per 24 hour  Intake 672 ml  Output 800 ml  Net -128 ml   Filed Weights   01/17/17 0213  Weight: 218 lb (98.9 kg)   Body mass index is 27.99 kg/m.  General:  Somnolent. Well nourished, well developed, in no acute distress HEENT: sclera anicteric  Neck: Temp catheter in R neck prevented assessment of JVD Vascular: No carotid bruits on L; unable to palpate distal pulses bilaterally 2/2 edema. Good distal capillary refil Cardiac:  normal S1, S2; RRR; no murmur, gallops, or rubs Lungs: crackles at bases b/l, no wheezing or rhonchi  Abd: NABS, soft, nontender, no hepatomegaly Ext: 3+ b/l LE edema extending to knees. Both LE wrapped Musculoskeletal:  BUE and BLE strength normal and equal Skin: warm and dry  Neuro:  CNs 2-12 intact, no focal abnormalities noted, A&Ox3 Psych:  Normal affect   EKG:  The EKG was personally reviewed and demonstrates:   NSR, low voltage, no STE/D, no TWI. QTC 495 Telemetry:  Telemetry was personally  reviewed and demonstrates:  NSR  Relevant CV Studies: ECHO 01/17/17 Study Conclusions  - Left ventricle: The cavity size was normal. There was moderate   concentric hypertrophy. Systolic function was moderately reduced.   The estimated ejection fraction was in the range of 35% to 40%.   Moderate diffuse hypokinesis. Features are consistent with a   pseudonormal left ventricular filling pattern, with concomitant   abnormal relaxation and increased filling pressure (grade 2   diastolic dysfunction). Doppler parameters are consistent with   high ventricular filling pressure. - Aortic valve: Trileaflet; mildly thickened,  mildly calcified   leaflets. - Mitral valve: There was mild regurgitation. - Right ventricle: The cavity size was severely dilated. Wall   thickness was normal. - Right atrium: The atrium was severely dilated. - Tricuspid valve: There was mild-moderate regurgitation. - Pulmonary arteries: PA peak pressure: 64 mm Hg (S). - Pericardium, extracardiac: A trivial, free-flowing pericardial   effusion was identified circumferential to the heart. The fluid   had no internal echoes.  Impressions:  - The right ventricular systolic pressure was increased consistent   with moderate pulmonary hypertension.   Laboratory Data:  Chemistry Recent Labs  Lab 01/16/17 1249 01/16/17 1316 01/17/17 0531  NA 135 136 135  K 5.4* 5.3* 5.6*  CL 102 102 101  CO2 21*  --  22  GLUCOSE 105* 101* 115*  BUN 118* 94* 118*  CREATININE 6.98* 7.00* 6.50*  CALCIUM 8.8*  --  8.3*  GFRNONAA 8*  --  9*  GFRAA 9*  --  10*  ANIONGAP 12  --  12    Recent Labs  Lab 01/16/17 1314 01/17/17 0531  PROT 6.6 6.4*  ALBUMIN 2.8* 2.7*  AST 24 22  ALT 64* 56  ALKPHOS 48 46  BILITOT 1.0 0.8   Hematology Recent Labs  Lab 01/16/17 1249 01/16/17 1316 01/17/17 0531  WBC 6.0  --  17.6*  RBC 4.66  --  4.69  HGB 14.1 14.3 14.0  HCT 39.8 42.0 40.1  MCV 85.4  --  85.5  MCH 30.3  --  29.9    MCHC 35.4  --  34.9  RDW 13.3  --  13.4  PLT 370  --  362   Cardiac Enzymes Recent Labs  Lab 01/16/17 1944 01/17/17 0117 01/17/17 0531  TROPONINI 0.37* 0.30* 0.29*    Recent Labs  Lab 01/16/17 1314  TROPIPOC 0.43*    BNP Recent Labs  Lab 01/16/17 1249  BNP >4,500.0*    DDimer No results for input(s): DDIMER in the last 168 hours.  Radiology/Studies:  Dg Chest 2 View  Result Date: 01/16/2017 CLINICAL DATA:  Shortness of breath, BILATERAL lower extremity edema with weeping wounds, on oxygen, history hypertension, former smoker EXAM: CHEST  2 VIEW COMPARISON:  Non FINDINGS: Enlargement of cardiac silhouette with pulmonary vascular congestion. Rotated to the RIGHT. Subsegmental atelectasis LEFT mid lung and at both bases greater on LEFT. Minimal LEFT pleural effusion. No definite acute infiltrate, pleural effusion or pneumothorax. IMPRESSION: Enlargement of cardiac silhouette with vascular congestion. Scattered atelectasis greater on LEFT with minimal LEFT pleural effusion. Electronically Signed   By: Lavonia Dana M.D.   On: 01/16/2017 13:09   Ct Head Wo Contrast  Result Date: 01/16/2017 CLINICAL DATA:  Headache hypertension EXAM: CT HEAD WITHOUT CONTRAST TECHNIQUE: Contiguous axial images were obtained from the base of the skull through the vertex without intravenous contrast. COMPARISON:  None. FINDINGS: Motion degraded images limit assessment. Brain: No evidence of acute large vascular territory infarction, hemorrhage, hydrocephalus, extra-axial collection or mass lesion/mass effect. No intraventricular hemorrhage. No effacement of the basal cisterns or fourth ventricle. Hyperdensity along the falx likely related to falcine calcifications, without eccentric or focal thickening to suggest hemorrhage. Minimal periventricular white matter hypodensities consistent with small vessel ischemic disease. Vascular: No hyperdense vessel or unexpected calcification. Skull: Negative for fracture or  focal lesion. Sinuses/Orbits: No acute finding. Other: None IMPRESSION: Limited by motion artifacts. Minimal small vessel ischemic disease. No acute intracranial abnormality. Electronically Signed   By: Ashley Royalty M.D.   On:  01/16/2017 18:09   US Renal  Result Date: 01/16/2017 CLINICAL DATA:  Acute renal failure.  History of hypertension. EXAM: RENAL / URINARY TRACT ULTRASOUND COMPLETE COMPARISON:  01/29/2015 FINDINGS: Right Kidney: Length: 9.9 cm. Echogenic renal parenchyma. No focal mass or hydronephrosis. Left Kidney: Length: 9.2 cm. Echogenic renal parenchyma. No focal mass or hydronephrosis. Bladder: Small amount of debris is noted within the bladder. Additional: Bilateral pleural effusions are noted. IMPRESSION: 1. Echogenic renal parenchyma bilaterally, compatible with the history of renal failure. No hydronephrosis. 2. Bilateral pleural effusions. Electronically Signed   By: Nolon Nations M.D.   On: 01/16/2017 18:47   Ct Tibia Fibula Left Wo Contrast  Result Date: 01/16/2017 CLINICAL DATA:  Cellulitis.  Evaluate for osteomyelitis. EXAM: CT OF THE LOWER LEFT EXTREMITY WITHOUT CONTRAST TECHNIQUE: Multidetector CT imaging of the lower left extremity was performed according to the standard protocol. COMPARISON:  None. FINDINGS: Bones/Joint/Cartilage No fracture or dislocation. Normal alignment. No joint effusion. Moderate medial femorotibial compartment joint space narrowing with marginal osteophytes and subchondral sclerosis. Mild lateral femorotibial compartment joint space narrowing with small marginal osteophytes. Small patellofemoral compartment marginal osteophytes. Subchondral cystic changes in the medial corner of the talar dome. Small anterior tibial plafond marginal osteophyte. No aggressive osseous lesion. No periosteal reaction or bone destruction. Ligaments Ligaments are suboptimally evaluated by CT. Muscles and Tendons Muscles are normal.  No muscle atrophy. Soft tissue Severe soft tissue  edema within the subcutaneous fat of the left lower leg. Severe soft tissue edema within the subcutaneous fat of the visualized portions of the right lower leg. No soft tissue mass. IMPRESSION: 1. Severe soft tissue edema within the subcutaneous fat of the left lower leg. Severe soft tissue edema within the subcutaneous fat of the visualized portions of the right lower leg. Differential considerations include severe cellulitis versus venous stasis. Electronically Signed   By: Kathreen Devoid   On: 01/16/2017 17:32    Assessment and Plan:   Acute combined heart failure: patient presenting with worsening SOB and b/l LE edema. Previous diagnosis of diastolic heart failure, now with systolic component. Exacerbation likely HTN mediated. - BNP >4500 - CXR with vascular congestion - Echo with reduced EF 35-40% (possibly lower per Dr. Jacalyn Lefevre read) and moderate diffuse hypokinesis. (previously 50-55% 01/2015) - Currently receiving IV lasix 160mg  q6h with only -376mL net output in the last 24 hr. Anticipate he will need HD to correct volume overload status. Nephrology following and patient is s/p IR temp cath placement today - Continue carvedilol (on max dose).  - Continue amlodipine and hydralazine - Will add imdur 60mg  daily  - Encourage compliance with medications. Can talk with SW to determine if any assistance can be provided to cover cost of meds. - Will need repeat ECHO in 3 months to monitor for improvement.   Hypertensive Emergency: BP improved from initial presentation, however still elevated - BP 224/178 on presentation to the ED - Now stable on amlodipine, carvedilol, hydralazine, and prn labetalol. Also receiving IV lasix q6h.  - CT Head negative for acute findings - Continue amlodipine and hydralazine. - Will add imdur 60mg  daily - Monitor BP closely with the initiation of HD.  Elevated Troponin: likely 2/2 demand ischemia in the setting of ESRD and heart failure exacerbation. Patient is  without chest pain  - Troponin peaked 0.43>>0.29 - EKG with NSR, low voltage, no STE/D, no TWI. QTC 495 - No need for further ischemic work up  Acute on Chronic Renal Failure: baseline Cr 3.2,  now presenting with Cr 7.0 - Patient has progressed to ESRD - Nephrology following with plans to initiate HD after placement of temp cath by IR today.   Left lower extremity cellulitis:  - CT LLE with c/f severe cellulitis vs venous stasis - b/l ABI's with no evidence of significant arterial disease in b/l LE - Management per primary team   For questions or updates, please contact [place] Please consult [email or web address] for contact info under Cardiology/STEMI.   Signed, Abigail Butts, PA-C  01/18/2017 9:33 AM [phone] As above, patient seen and examined.  Briefly he is a 57 year old male with past medical history of hypertension, medical noncompliance and chronic kidney disease for evaluation of cardiomyopathy and elevated troponin.  Was admitted on January 6 with complaints of dyspnea on exertion, orthopnea, weight gain and lower extremity edema.  Patient states this had only been present for 2 weeks.  He denies chest pain.  Patient's initial blood pressure was 224/178. Physical exam shows massive volume overload.  BUN 118 and creatinine 6.50.  Echocardiogram was personally reviewed.  Ejection fraction appears to be 15-20%.  There is moderate mitral and tricuspid regurgitation.  Electrocardiogram shows normal sinus rhythm, nonspecific ST changes, prolonged QT interval.  BNP greater than 4500.  Troponin I 0.43, 0.37, 0.30 and 0.29.   1 cardiomyopathy-etiology is likely hypertensive mediated.  He has he was severely hypertensive at time of admission.  Not been compliant with his blood pressure medications.  Would continue carvedilol, amlodipine and hydralazine.  Add isosorbide 60 mg daily.  I have not added an ACE inhibitor given his renal insufficiency.  Would continue medications for 3  months and then repeat echocardiogram assuming his blood pressure has been controlled and he has been compliant with medications.  His LV function will likely improve once blood pressure is controlled.  If not would need ischemia evaluation at that time.  2 elevated troponin-minimal elevation in setting of congestive heart failure and end-stage renal disease.  No clear trend and no chest pain.  Not consistent with acute coronary syndrome.  3 hypertensive emergency-blood pressure severely elevated at time of admission.  Continue carvedilol, amlodipine, hydralazine and add nitrates.  Dialysis is to be initiated.  This should improve his blood pressure and we may need to decrease medications as volume status improves.  4 acute systolic/diastolic congestive heart failure-patient is markedly volume overloaded likely from combination of congestive heart failure and end-stage renal disease.  Volume removal per dialysis.  5 end-stage renal disease-patient is being followed by nephrology and dialysis is to be initiated.  I explained the importance of being compliant with dialysis.  6 lower extremity cellulitis-management per internal medicine.  Continue antibiotics.  Kirk Ruths, MD

## 2017-01-18 NOTE — Progress Notes (Addendum)
[facility]                                                                              Patient Demographics  Jason [name], is a 57 y.o. male, DOB - [birth year], DVV:[id number]  Admit date - 01/16/2017   Admitting Physician A Melven Sartorius., MD  Outpatient Primary MD for the patient is Wandra Mannan, NP  Outpatient specialists:   LOS - 2  days   Medical records reviewed and are as summarized below:    Chief Complaint  Patient presents with  . Leg Swelling       Brief summary   Jason [name] a 57 y.o.malewith medical history significant ofhypertension, chronic kidney disease, anemia, inguinal hernia presenting with worsening shortness of breath, lower extremity edema, and altered mental status. Found to have hypertensive emergency with worsening renal function and heart failure as well as acute encephalopathy and a LLE cellulitis.   Assessment & Plan    Principal Problem:   Hypertensive emergency -BP 224/171 at the time of admission  - BP currently improved, continue amlodipine, Coreg, hydralazine - will continue to improve once patient is started on hemodialysis   Active Problems:   Acute kidney injury ([place]) on chronic kidney disease, stage IV, progressed to new ESRD -Nephrology consulted, plan to start hemodialysis.  Lasix discontinued. -Vascular surgery consulted for permanent axis -IR consulted for [facility], will start dialysis today   Acute hypoxic respiratory failure secondary to acute systolic and diastolic CHF, hypervolemia due to ESRD -2D echo showed EF of 35-40% with moderate diffuse hypokinesis, grade 2 diastolic dysfunction -Per Dr. Florene Glen, cardiology has been consulted and will follow further recommendation patient to start hemodialysis, will -Patient to start hemodialysis today, will help with massive peripheral edema and CHF exacerbation    Elevated troponin -Troponin bumped to 0.43 suspect due to ESRD and demand ischemia -2D echo  with EF of 35-40% with moderate diffuse hypokinesis  -Cardiology to follow  Left lower extremity cellulitis -Massive peripheral edema on exam, CT with severe soft tissue edema -Wound care following, dressings addressed -Continue IV vancomycin pharmacy -CBC, BMET pending, blood cultures in process  Left knee pain -Rule out acute gout, placed on pain control, obtain ESR, CRP, uric acid  Code Status: Full CODE STATUS DVT Prophylaxis: Heparin Family Communication: Discussed in detail with the patient, all imaging results, lab results explained to the patient   Disposition Plan: will need CLIP   Time Spent in minutes  35 minutes  Procedures:  2D echo: Systolic function was moderately reduced.The estimated ejection fraction was in the range of 35% to 40%.Moderate diffuse hypokinesis. Features are consistent with a pseudonormal left ventricular filling pattern, with concomitant abnormal relaxation and increased filling pressure (grade 2 diastolic dysfunction)  ABI: Normal   Consultants:   Nephrology Vascular surgery Interventional radiology  Antimicrobials:      Medications  Scheduled Meds: . amLODipine  10 mg Oral Daily  . carvedilol  25 mg Oral BID WC  . heparin      . hydrALAZINE  100 mg Oral Q8H  . lidocaine       Continuous Infusions: PRN Meds:.acetaminophen,  heparin, HYDROmorphone (DILAUDID) injection, labetalol, lidocaine (PF), oxyCODONE-acetaminophen   Antibiotics   Anti-infectives (From admission, onward)   Start     Dose/Rate Route Frequency Ordered Stop   01/16/17 1800  vancomycin (VANCOCIN) 2,000 mg in sodium chloride 0.9 % 500 mL IVPB     2,000 mg 250 mL/hr over 120 Minutes Intravenous  Once 01/16/17 1734 01/16/17 2344   01/16/17 1730  vancomycin (VANCOCIN) IVPB 1000 mg/200 mL premix  Status:  Discontinued     1,000 mg 200 mL/hr over 60 Minutes Intravenous  Once 01/16/17 1720 01/16/17 1734        Subjective:   Jason [name] was seen and  examined today.  BP improving, complaining of left knee pain, feeling somewhat better from admission.  Patient denies dizziness, chest pain, shortness of breath, abdominal pain, N/V/D/C, new weakness, numbess, tingling. No acute events overnight.    Objective:   Vitals:   01/18/17 0015 01/18/17 0054 01/18/17 0447 01/18/17 0800  BP:  (!) 138/93 (!) 143/105 (!) 147/98  Pulse:  62 66 67  Resp: (!) 24 19 (!) 25 (!) 26  Temp:   98.3 F (36.8 C) 98.3 F (36.8 C)  TempSrc:   Oral Oral  SpO2: 98% 96% 98% 98%  Weight:      Height:        Intake/Output Summary (Last 24 hours) at 01/18/2017 1115 Last data filed at 01/18/2017 0500 Gross per 24 hour  Intake 672 ml  Output 800 ml  Net -128 ml     Wt Readings from Last 3 Encounters:  01/17/17 98.9 kg (218 lb)  04/04/16 93 kg (205 lb)  02/26/15 95.3 kg (210 lb)     Exam  General: Alert and oriented x 3, NAD  Eyes:   HEENT:    Cardiovascular: S1 S2 clear, RRR   Respiratory: Bibasilar crackles  Gastrointestinal: Soft, nontender, nondistended, + bowel sounds  Ext: massive bilateral lower extremity edema with purulent cellulitis to left lower extremity  Neuro: no new deficits  Musculoskeletal: No digital cyanosis, clubbing  Skin: Erythema, drainage on the left lower extremity with cellulitis  Psych: Normal affect and demeanor, alert and oriented x3    Data Reviewed:  I have personally reviewed following labs and imaging studies  Micro Results Recent Results (from the past 240 hour(s))  MRSA PCR Screening     Status: None   Collection Time: 01/17/17  9:31 PM  Result Value Ref Range Status   MRSA by PCR NEGATIVE NEGATIVE Final    Comment:        The GeneXpert MRSA Assay (FDA approved for NASAL specimens only), is one component of a comprehensive MRSA colonization surveillance program. It is not intended to diagnose MRSA infection nor to guide or monitor treatment for MRSA infections.     Radiology Reports Dg  Chest 2 View  Result Date: 01/16/2017 CLINICAL DATA:  Shortness of breath, BILATERAL lower extremity edema with weeping wounds, on oxygen, history hypertension, former smoker EXAM: CHEST  2 VIEW COMPARISON:  Non FINDINGS: Enlargement of cardiac silhouette with pulmonary vascular congestion. Rotated to the RIGHT. Subsegmental atelectasis LEFT mid lung and at both bases greater on LEFT. Minimal LEFT pleural effusion. No definite acute infiltrate, pleural effusion or pneumothorax. IMPRESSION: Enlargement of cardiac silhouette with vascular congestion. Scattered atelectasis greater on LEFT with minimal LEFT pleural effusion. Electronically Signed   By: Mark  Boles M.D.   On: 01/16/2017 13:09   Ct Head Wo Contrast  Result Date: 01/16/2017 CLINICAL DATA:    Headache hypertension EXAM: CT HEAD WITHOUT CONTRAST TECHNIQUE: Contiguous axial images were obtained from the base of the skull through the vertex without intravenous contrast. COMPARISON:  None. FINDINGS: Motion degraded images limit assessment. Brain: No evidence of acute large vascular territory infarction, hemorrhage, hydrocephalus, extra-axial collection or mass lesion/mass effect. No intraventricular hemorrhage. No effacement of the basal cisterns or fourth ventricle. Hyperdensity along the falx likely related to falcine calcifications, without eccentric or focal thickening to suggest hemorrhage. Minimal periventricular white matter hypodensities consistent with small vessel ischemic disease. Vascular: No hyperdense vessel or unexpected calcification. Skull: Negative for fracture or focal lesion. Sinuses/Orbits: No acute finding. Other: None IMPRESSION: Limited by motion artifacts. Minimal small vessel ischemic disease. No acute intracranial abnormality. Electronically Signed   By: Ashley Royalty M.D.   On: 01/16/2017 18:09   US Renal  Result Date: 01/16/2017 CLINICAL DATA:  Acute renal failure.  History of hypertension. EXAM: RENAL / URINARY TRACT ULTRASOUND  COMPLETE COMPARISON:  01/29/2015 FINDINGS: Right Kidney: Length: 9.9 cm. Echogenic renal parenchyma. No focal mass or hydronephrosis. Left Kidney: Length: 9.2 cm. Echogenic renal parenchyma. No focal mass or hydronephrosis. Bladder: Small amount of debris is noted within the bladder. Additional: Bilateral pleural effusions are noted. IMPRESSION: 1. Echogenic renal parenchyma bilaterally, compatible with the history of renal failure. No hydronephrosis. 2. Bilateral pleural effusions. Electronically Signed   By: Nolon Nations M.D.   On: 01/16/2017 18:47   Ct Tibia Fibula Left Wo Contrast  Result Date: 01/16/2017 CLINICAL DATA:  Cellulitis.  Evaluate for osteomyelitis. EXAM: CT OF THE LOWER LEFT EXTREMITY WITHOUT CONTRAST TECHNIQUE: Multidetector CT imaging of the lower left extremity was performed according to the standard protocol. COMPARISON:  None. FINDINGS: Bones/Joint/Cartilage No fracture or dislocation. Normal alignment. No joint effusion. Moderate medial femorotibial compartment joint space narrowing with marginal osteophytes and subchondral sclerosis. Mild lateral femorotibial compartment joint space narrowing with small marginal osteophytes. Small patellofemoral compartment marginal osteophytes. Subchondral cystic changes in the medial corner of the talar dome. Small anterior tibial plafond marginal osteophyte. No aggressive osseous lesion. No periosteal reaction or bone destruction. Ligaments Ligaments are suboptimally evaluated by CT. Muscles and Tendons Muscles are normal.  No muscle atrophy. Soft tissue Severe soft tissue edema within the subcutaneous fat of the left lower leg. Severe soft tissue edema within the subcutaneous fat of the visualized portions of the right lower leg. No soft tissue mass. IMPRESSION: 1. Severe soft tissue edema within the subcutaneous fat of the left lower leg. Severe soft tissue edema within the subcutaneous fat of the visualized portions of the right lower leg.  Differential considerations include severe cellulitis versus venous stasis. Electronically Signed   By: Kathreen Devoid   On: 01/16/2017 17:32    Lab Data:  CBC: Recent Labs  Lab 01/16/17 1249 01/16/17 1316 01/17/17 0531  WBC 6.0  --  17.6*  NEUTROABS 5.1  --   --   HGB 14.1 14.3 14.0  HCT 39.8 42.0 40.1  MCV 85.4  --  85.5  PLT 370  --  696   Basic Metabolic Panel: Recent Labs  Lab 01/16/17 1249 01/16/17 1314 01/16/17 1316 01/17/17 0531  NA 135  --  136 135  K 5.4*  --  5.3* 5.6*  CL 102  --  102 101  CO2 21*  --   --  22  GLUCOSE 105*  --  101* 115*  BUN 118*  --  94* 118*  CREATININE 6.98*  --  7.00* 6.50*  CALCIUM  8.8*  --   --  8.3*  PHOS  --  7.9*  --   --    GFR: Estimated Creatinine Clearance: 16.1 mL/min (A) (by C-G formula based on SCr of 6.5 mg/dL (H)). Liver Function Tests: Recent Labs  Lab 01/16/17 1314 01/17/17 0531  AST 24 22  ALT 64* 56  ALKPHOS 48 46  BILITOT 1.0 0.8  PROT 6.6 6.4*  ALBUMIN 2.8* 2.7*   No results for input(s): LIPASE, AMYLASE in the last 168 hours. No results for input(s): AMMONIA in the last 168 hours. Coagulation Profile: No results for input(s): INR, PROTIME in the last 168 hours. Cardiac Enzymes: Recent Labs  Lab 01/16/17 1944 01/17/17 0117 01/17/17 0531  TROPONINI 0.37* 0.30* 0.29*   BNP (last 3 results) No results for input(s): PROBNP in the last 8760 hours. HbA1C: Recent Labs    01/17/17 0117  HGBA1C 4.9   CBG: No results for input(s): GLUCAP in the last 168 hours. Lipid Profile: No results for input(s): CHOL, HDL, LDLCALC, TRIG, CHOLHDL, LDLDIRECT in the last 72 hours. Thyroid Function Tests: No results for input(s): TSH, T4TOTAL, FREET4, T3FREE, THYROIDAB in the last 72 hours. Anemia Panel: No results for input(s): VITAMINB12, FOLATE, FERRITIN, TIBC, IRON, RETICCTPCT in the last 72 hours. Urine analysis:    Component Value Date/Time   COLORURINE YELLOW 01/16/2017 1420   APPEARANCEUR CLEAR  01/16/2017 1420   LABSPEC 1.013 01/16/2017 1420   PHURINE 5.0 01/16/2017 1420   GLUCOSEU NEGATIVE 01/16/2017 1420   HGBUR SMALL (A) 01/16/2017 1420   BILIRUBINUR NEGATIVE 01/16/2017 1420   KETONESUR NEGATIVE 01/16/2017 1420   PROTEINUR 100 (A) 01/16/2017 1420   UROBILINOGEN 0.2 02/26/2015 1028   NITRITE NEGATIVE 01/16/2017 1420   LEUKOCYTESUR NEGATIVE 01/16/2017 1420       M.D. [facility] 01/18/2017, 11:15 AM  Pager: [phone] Between 7am to 7pm - call Pager - [phone]  After 7pm go to [email or web address] - password [email or web address]  Call night coverage person covering after 7pm   

## 2017-01-18 NOTE — Progress Notes (Signed)
[facility] ROUNDING NOTE   Subjective:   CKD stage 5  Presents to ER with volume overload and needing to start dialysis  Will need to initiate CLIP  Appears ready to start dialysis once permcath placed   Objective:  Vital signs in last 24 hours:  Temp:  [97.3 F (36.3 C)-98.3 F (36.8 C)] 98.3 F (36.8 C) (01/08 0800) Pulse Rate:  [56-67] 67 (01/08 0800) Resp:  [17-26] 26 (01/08 0800) BP: (124-166)/(80-115) 147/98 (01/08 0800) SpO2:  [94 %-100 %] 98 % (01/08 0800)  Weight change:  Filed Weights   01/17/17 0213  Weight: 218 lb (98.9 kg)    Intake/Output: I/O last 3 completed shifts: In: 672 [P.O.:490; IV Piggyback:182] Out: 800 [Urine:800]   Intake/Output this shift:  No intake/output data recorded.  Groggy, arouses easily, no resp distress +jvd Chest clear bilat RRR Abd soft no ascites Ext 3-4+ edema bilat, open skin wounds LLE NF, ox 3, gen'd weakness     Basic Metabolic Panel: Recent Labs  Lab 01/16/17 1249 01/16/17 1314 01/16/17 1316 01/17/17 0531  NA 135  --  136 135  K 5.4*  --  5.3* 5.6*  CL 102  --  102 101  CO2 21*  --   --  22  GLUCOSE 105*  --  101* 115*  BUN 118*  --  94* 118*  CREATININE 6.98*  --  7.00* 6.50*  CALCIUM 8.8*  --   --  8.3*  PHOS  --  7.9*  --   --     Liver Function Tests: Recent Labs  Lab 01/16/17 1314 01/17/17 0531  AST 24 22  ALT 64* 56  ALKPHOS 48 46  BILITOT 1.0 0.8  PROT 6.6 6.4*  ALBUMIN 2.8* 2.7*   No results for input(s): LIPASE, AMYLASE in the last 168 hours. No results for input(s): AMMONIA in the last 168 hours.  CBC: Recent Labs  Lab 01/16/17 1249 01/16/17 1316 01/17/17 0531  WBC 6.0  --  17.6*  NEUTROABS 5.1  --   --   HGB 14.1 14.3 14.0  HCT 39.8 42.0 40.1  MCV 85.4  --  85.5  PLT 370  --  362    Cardiac Enzymes: Recent Labs  Lab 01/16/17 1944 01/17/17 0117 01/17/17 0531  TROPONINI 0.37* 0.30* 0.29*    BNP: Invalid input(s): POCBNP  CBG: No results for  input(s): GLUCAP in the last 168 hours.  Microbiology: Results for orders placed or performed during the hospital encounter of 01/16/17  MRSA PCR Screening     Status: None   Collection Time: 01/17/17  9:31 PM  Result Value Ref Range Status   MRSA by PCR NEGATIVE NEGATIVE Final    Comment:        The GeneXpert MRSA Assay (FDA approved for NASAL specimens only), is one component of a comprehensive MRSA colonization surveillance program. It is not intended to diagnose MRSA infection nor to guide or monitor treatment for MRSA infections.     Coagulation Studies: No results for input(s): LABPROT, INR in the last 72 hours.  Urinalysis: Recent Labs    01/16/17 1420  COLORURINE YELLOW  LABSPEC 1.013  PHURINE 5.0  GLUCOSEU NEGATIVE  HGBUR SMALL*  BILIRUBINUR NEGATIVE  KETONESUR NEGATIVE  PROTEINUR 100*  NITRITE NEGATIVE  LEUKOCYTESUR NEGATIVE      Imaging: Dg Chest 2 View  Result Date: 01/16/2017 CLINICAL DATA:  Shortness of breath, BILATERAL lower extremity edema with weeping wounds, on oxygen, history hypertension, former smoker  EXAM: CHEST  2 VIEW COMPARISON:  Non FINDINGS: Enlargement of cardiac silhouette with pulmonary vascular congestion. Rotated to the RIGHT. Subsegmental atelectasis LEFT mid lung and at both bases greater on LEFT. Minimal LEFT pleural effusion. No definite acute infiltrate, pleural effusion or pneumothorax. IMPRESSION: Enlargement of cardiac silhouette with vascular congestion. Scattered atelectasis greater on LEFT with minimal LEFT pleural effusion. Electronically Signed   By: Lavonia Dana M.D.   On: 01/16/2017 13:09   Ct Head Wo Contrast  Result Date: 01/16/2017 CLINICAL DATA:  Headache hypertension EXAM: CT HEAD WITHOUT CONTRAST TECHNIQUE: Contiguous axial images were obtained from the base of the skull through the vertex without intravenous contrast. COMPARISON:  None. FINDINGS: Motion degraded images limit assessment. Brain: No evidence of acute  large vascular territory infarction, hemorrhage, hydrocephalus, extra-axial collection or mass lesion/mass effect. No intraventricular hemorrhage. No effacement of the basal cisterns or fourth ventricle. Hyperdensity along the falx likely related to falcine calcifications, without eccentric or focal thickening to suggest hemorrhage. Minimal periventricular white matter hypodensities consistent with small vessel ischemic disease. Vascular: No hyperdense vessel or unexpected calcification. Skull: Negative for fracture or focal lesion. Sinuses/Orbits: No acute finding. Other: None IMPRESSION: Limited by motion artifacts. Minimal small vessel ischemic disease. No acute intracranial abnormality. Electronically Signed   By: Ashley Royalty M.D.   On: 01/16/2017 18:09   US Renal  Result Date: 01/16/2017 CLINICAL DATA:  Acute renal failure.  History of hypertension. EXAM: RENAL / URINARY TRACT ULTRASOUND COMPLETE COMPARISON:  01/29/2015 FINDINGS: Right Kidney: Length: 9.9 cm. Echogenic renal parenchyma. No focal mass or hydronephrosis. Left Kidney: Length: 9.2 cm. Echogenic renal parenchyma. No focal mass or hydronephrosis. Bladder: Small amount of debris is noted within the bladder. Additional: Bilateral pleural effusions are noted. IMPRESSION: 1. Echogenic renal parenchyma bilaterally, compatible with the history of renal failure. No hydronephrosis. 2. Bilateral pleural effusions. Electronically Signed   By: Nolon Nations M.D.   On: 01/16/2017 18:47   Ct Tibia Fibula Left Wo Contrast  Result Date: 01/16/2017 CLINICAL DATA:  Cellulitis.  Evaluate for osteomyelitis. EXAM: CT OF THE LOWER LEFT EXTREMITY WITHOUT CONTRAST TECHNIQUE: Multidetector CT imaging of the lower left extremity was performed according to the standard protocol. COMPARISON:  None. FINDINGS: Bones/Joint/Cartilage No fracture or dislocation. Normal alignment. No joint effusion. Moderate medial femorotibial compartment joint space narrowing with  marginal osteophytes and subchondral sclerosis. Mild lateral femorotibial compartment joint space narrowing with small marginal osteophytes. Small patellofemoral compartment marginal osteophytes. Subchondral cystic changes in the medial corner of the talar dome. Small anterior tibial plafond marginal osteophyte. No aggressive osseous lesion. No periosteal reaction or bone destruction. Ligaments Ligaments are suboptimally evaluated by CT. Muscles and Tendons Muscles are normal.  No muscle atrophy. Soft tissue Severe soft tissue edema within the subcutaneous fat of the left lower leg. Severe soft tissue edema within the subcutaneous fat of the visualized portions of the right lower leg. No soft tissue mass. IMPRESSION: 1. Severe soft tissue edema within the subcutaneous fat of the left lower leg. Severe soft tissue edema within the subcutaneous fat of the visualized portions of the right lower leg. Differential considerations include severe cellulitis versus venous stasis. Electronically Signed   By: Kathreen Devoid   On: 01/16/2017 17:32     Medications:   . furosemide Stopped (01/18/17 0722)   . heparin      . lidocaine      . amLODipine  10 mg Oral Daily  . carvedilol  25 mg Oral BID WC  .  hydrALAZINE  100 mg Oral Q8H   acetaminophen, heparin, HYDROmorphone (DILAUDID) injection, labetalol, lidocaine (PF), oxyCODONE-acetaminophen  Assessment/ Plan:  1. CKD - lost to f/u.  Has progressive CKD, now likely ESRD, will need to start on dialysis Appreciate help from Dr Donnetta Hutching  perm access placed .  Left handed, save R arm. Stop lasix   Start dialysis  2. Severe HTN -w HTN'sive crisis, BP's 250/170 on presentation.  Improving w po/ IV meds. Contineu.  3. Anemia  Hb 14 4. Bones  Check Phos and PTH and Calcium    LOS: 2 Rigo Letts W @TODAY @10 :50 AM

## 2017-01-19 ENCOUNTER — Encounter (HOSPITAL_COMMUNITY): Payer: Self-pay

## 2017-01-19 DIAGNOSIS — I10 Essential (primary) hypertension: Secondary | ICD-10-CM

## 2017-01-19 DIAGNOSIS — Z008 Encounter for other general examination: Secondary | ICD-10-CM

## 2017-01-19 DIAGNOSIS — N183 Chronic kidney disease, stage 3 (moderate): Secondary | ICD-10-CM

## 2017-01-19 DIAGNOSIS — Z87891 Personal history of nicotine dependence: Secondary | ICD-10-CM

## 2017-01-19 DIAGNOSIS — I509 Heart failure, unspecified: Secondary | ICD-10-CM

## 2017-01-19 DIAGNOSIS — E8779 Other fluid overload: Secondary | ICD-10-CM

## 2017-01-19 DIAGNOSIS — N186 End stage renal disease: Secondary | ICD-10-CM

## 2017-01-19 DIAGNOSIS — I5021 Acute systolic (congestive) heart failure: Secondary | ICD-10-CM

## 2017-01-19 LAB — GLUCOSE, CAPILLARY: Glucose-Capillary: 99 mg/dL (ref 65–99)

## 2017-01-19 LAB — BASIC METABOLIC PANEL
Anion gap: 12 (ref 5–15)
BUN: 100 mg/dL — AB (ref 6–20)
CO2: 22 mmol/L (ref 22–32)
Calcium: 7.6 mg/dL — ABNORMAL LOW (ref 8.9–10.3)
Chloride: 102 mmol/L (ref 101–111)
Creatinine, Ser: 5.47 mg/dL — ABNORMAL HIGH (ref 0.61–1.24)
GFR, EST AFRICAN AMERICAN: 12 mL/min — AB (ref >60)
GFR, EST NON AFRICAN AMERICAN: 11 mL/min — AB (ref >60)
Glucose, Bld: 92 mg/dL (ref 65–99)
Potassium: 4.3 mmol/L (ref 3.5–5.1)
SODIUM: 136 mmol/L (ref 135–145)

## 2017-01-19 LAB — CBC
HEMATOCRIT: 35.4 % — AB (ref 39.0–52.0)
Hemoglobin: 11.8 g/dL — ABNORMAL LOW (ref 13.0–17.0)
MCH: 28.3 pg (ref 26.0–34.0)
MCHC: 33.3 g/dL (ref 30.0–36.0)
MCV: 84.9 fL (ref 78.0–100.0)
PLATELETS: 290 10*3/uL (ref 150–400)
RBC: 4.17 MIL/uL — AB (ref 4.22–5.81)
RDW: 13.3 % (ref 11.5–15.5)
WBC: 11.3 10*3/uL — AB (ref 4.0–10.5)

## 2017-01-19 LAB — HEPATITIS B CORE ANTIBODY, TOTAL: HEP B C TOTAL AB: NEGATIVE

## 2017-01-19 LAB — HEPATITIS B SURFACE ANTIBODY,QUALITATIVE: Hep B S Ab: NONREACTIVE

## 2017-01-19 LAB — HEPATITIS B SURFACE ANTIGEN: Hepatitis B Surface Ag: NEGATIVE

## 2017-01-19 MED ORDER — VANCOMYCIN HCL IN DEXTROSE 1-5 GM/200ML-% IV SOLN
INTRAVENOUS | Status: AC
  Administered 2017-01-19: 1000 mg via INTRAVENOUS
  Filled 2017-01-19: qty 200

## 2017-01-19 MED ORDER — VANCOMYCIN HCL IN DEXTROSE 1-5 GM/200ML-% IV SOLN
1000.0000 mg | INTRAVENOUS | Status: AC
  Administered 2017-01-19: 1000 mg via INTRAVENOUS

## 2017-01-19 MED ORDER — CALCIUM ACETATE (PHOS BINDER) 667 MG PO CAPS
667.0000 mg | ORAL_CAPSULE | Freq: Three times a day (TID) | ORAL | Status: DC
  Administered 2017-01-19 – 2017-01-25 (×13): 667 mg via ORAL
  Filled 2017-01-19 (×14): qty 1

## 2017-01-19 NOTE — Consult Note (Addendum)
[facility] Face-to-Face Psychiatry Consult   Reason for Consult:  Capacity evaluation Referring Physician:  Dr. Nevada Crane Patient Identification: Harveer Sadler MRN:  [id number] Principal Diagnosis: Evaluation by psychiatric service required Diagnosis:   Patient Active Problem List   Diagnosis Date Noted  . Hypertensive emergency [I16.1] 01/16/2017  . Elevated troponin [R74.8] 01/16/2017  . Acute CHF (congestive heart failure) ([place]) [I50.9] 01/16/2017  . Cellulitis [L03.90] 01/16/2017  . Chronic kidney disease [N18.9] 02/27/2015  . Essential hypertension [I10] 02/26/2015  . Right inguinal hernia [K40.90] 02/26/2015  . AKI (acute kidney injury) ([place]) [N17.9]   . Acute upper respiratory infection [J06.9] 01/30/2015  . Abnormal EKG [R94.31] 01/30/2015  . Acute kidney injury ([place]) [N17.9] 01/29/2015  . Anemia [D64.9] 01/29/2015    Total Time spent with patient: 1 hour  Subjective:   Charle Clear is a 57 y.o. male patient admitted with volume overload.  HPI:   Per chart review, patient has a history of CKD stage 5. He was admitted for volume overload and now likely has ESRD requiring hemodialysis. He has refused permanent access placement. Primary team is concerned that uremia may be playing a role in his decision making ability so psychiatry was consulted for capacity to refuse current treatment.   On interview, patient was initially asleep but easily aroused. He demonstrates understanding the importance of dialysis as he is able to correlate his improved medical condition with receiving dialysis. He agrees to continue treatment. He denies problems with mood or anxiety. He denies SI, HI or AVH. He denies problems with sleep or appetite.    Past Psychiatric History: Denies   Risk to Self: Is patient at risk for suicide?: No Risk to Others:  None. Denies HI. Prior Inpatient Therapy:  Denies  Prior Outpatient Therapy:  He reports seeing a therapist in the past.   Past Medical History:  Past Medical  History:  Diagnosis Date  . AKI (acute kidney injury) ([place]) 01/2015  . Hypertension     Past Surgical History:  Procedure Laterality Date  . IR FLUORO GUIDE CV LINE RIGHT  01/18/2017  . IR US GUIDE VASC ACCESS RIGHT  01/18/2017  . TOE SURGERY    . TONSILLECTOMY     Family History:  Family History  Problem Relation Age of Onset  . Heart attack Mother 56       CABG hx  . Heart disease Mother   . Hypertension Mother   . Healthy Father   . Healthy Brother    Family Psychiatric  History: Denies  Social History:  Social History   Substance and Sexual Activity  Alcohol Use No     Social History   Substance and Sexual Activity  Drug Use No    Social History   Socioeconomic History  . Marital status: Single    Spouse name: None  . Number of children: None  . Years of education: None  . Highest education level: None  Social Needs  . Financial resource strain: None  . Food insecurity - worry: None  . Food insecurity - inability: None  . Transportation needs - medical: None  . Transportation needs - non-medical: None  Occupational History  . None  Tobacco Use  . Smoking status: Former Research scientist (life sciences)  . Smokeless tobacco: Never Used  . Tobacco comment: quit smoking in 1997  Substance and Sexual Activity  . Alcohol use: No  . Drug use: No  . Sexual activity: None  Other Topics Concern  . None  Social History Narrative  .  None   Additional Social History: He lives at home alone. He is single/never married and has 39 y/o twin daughters. He is a Administrator. He denies alcohol, illicit substance or tobacco use.     Allergies:   Allergies  Allergen Reactions  . Lisinopril Cough    Labs:  Results for orders placed or performed during the hospital encounter of 01/16/17 (from the past 48 hour(s))  Blood gas, arterial     Status: Abnormal   Collection Time: 01/17/17  2:40 PM  Result Value Ref Range   O2 Content 2.0 L/min   Delivery systems NASAL CANNULA    pH, Arterial  7.331 (L) 7.350 - 7.450   pCO2 arterial 42.5 32.0 - 48.0 mmHg   pO2, Arterial 91.3 83.0 - 108.0 mmHg   Bicarbonate 21.8 20.0 - 28.0 mmol/L   Acid-base deficit 3.4 (H) 0.0 - 2.0 mmol/L   O2 Saturation 95.9 %   Patient temperature 98.6    Collection site LEFT BRACHIAL    Drawn by [id number]    Sample type ARTERIAL DRAW    Allens test (pass/fail) PASS PASS  MRSA PCR Screening     Status: None   Collection Time: 01/17/17  9:31 PM  Result Value Ref Range   MRSA by PCR NEGATIVE NEGATIVE    Comment:        The GeneXpert MRSA Assay (FDA approved for NASAL specimens only), is one component of a comprehensive MRSA colonization surveillance program. It is not intended to diagnose MRSA infection nor to guide or monitor treatment for MRSA infections.   Sedimentation rate     Status: None   Collection Time: 01/18/17  9:54 AM  Result Value Ref Range   Sed Rate 11 0 - 16 mm/hr  C-reactive protein     Status: Abnormal   Collection Time: 01/18/17  9:54 AM  Result Value Ref Range   CRP 7.3 (H) <1.0 mg/dL  Uric acid     Status: Abnormal   Collection Time: 01/18/17  9:54 AM  Result Value Ref Range   Uric Acid, Serum 13.5 (H) 4.4 - 7.6 mg/dL  Hepatitis B surface antigen     Status: None   Collection Time: 01/18/17  4:48 PM  Result Value Ref Range   Hepatitis B Surface Ag Negative Negative    Comment: (NOTE) Performed At: [facility] [address] [place], [place] [id number] Rush Farmer MD MS:[id number]   Phosphorus     Status: Abnormal   Collection Time: 01/18/17  4:50 PM  Result Value Ref Range   Phosphorus 5.9 (H) 2.5 - 4.6 mg/dL  Hepatitis B surface antibody     Status: None   Collection Time: 01/18/17  4:50 PM  Result Value Ref Range   Hep B S Ab Non Reactive     Comment: (NOTE)              Non Reactive: Inconsistent with immunity,                            less than 10 mIU/mL              Reactive:     Consistent with immunity,                            greater  than 9.9 mIU/mL Performed At: [facility] [address] [place], [place] [id number] Rush Farmer  MD [id number]   Hepatitis B core antibody, total     Status: None   Collection Time: 01/18/17  4:50 PM  Result Value Ref Range   Hep B Core Total Ab Negative Negative    Comment: (NOTE) Performed At: [facility] [place], [place] [id number] Rush Farmer MD [id number]   Basic metabolic panel     Status: Abnormal   Collection Time: 01/19/17  2:55 AM  Result Value Ref Range   Sodium 136 135 - 145 mmol/L   Potassium 4.3 3.5 - 5.1 mmol/L   Chloride 102 101 - 111 mmol/L   CO2 22 22 - 32 mmol/L   Glucose, Bld 92 65 - 99 mg/dL   BUN 100 (H) 6 - 20 mg/dL   Creatinine, Ser 5.47 (H) 0.61 - 1.24 mg/dL   Calcium 7.6 (L) 8.9 - 10.3 mg/dL   GFR calc non Af Amer 11 (L) >60 mL/min   GFR calc Af Amer 12 (L) >60 mL/min    Comment: (NOTE) The eGFR has been calculated using the CKD EPI equation. This calculation has not been validated in all clinical situations. eGFR's persistently <60 mL/min signify possible Chronic Kidney Disease.    Anion gap 12 5 - 15  CBC     Status: Abnormal   Collection Time: 01/19/17  2:55 AM  Result Value Ref Range   WBC 11.3 (H) 4.0 - 10.5 K/uL   RBC 4.17 (L) 4.22 - 5.81 MIL/uL   Hemoglobin 11.8 (L) 13.0 - 17.0 g/dL   HCT 35.4 (L) 39.0 - 52.0 %   MCV 84.9 78.0 - 100.0 fL   MCH 28.3 26.0 - 34.0 pg   MCHC 33.3 30.0 - 36.0 g/dL   RDW 13.3 11.5 - 15.5 %   Platelets 290 150 - 400 K/uL  Glucose, capillary     Status: None   Collection Time: 01/19/17  8:56 AM  Result Value Ref Range   Glucose-Capillary 99 65 - 99 mg/dL   Comment 1 Notify RN    Comment 2 Document in Chart     Current Facility-Administered Medications  Medication Dose Route Frequency Provider Last Rate Last Dose  . 0.9 %  sodium chloride infusion  100 mL Intravenous PRN Roney Jaffe, MD      . 0.9 %  sodium chloride infusion  100 mL Intravenous PRN  Roney Jaffe, MD      . acetaminophen (TYLENOL) tablet 650 mg  650 mg Oral Q4H PRN Rai, Ripudeep K, MD      . alteplase (CATHFLO ACTIVASE) injection 2 mg  2 mg Intracatheter Once PRN Roney Jaffe, MD      . amLODipine (NORVASC) tablet 10 mg  10 mg Oral Daily Pearson Grippe B, MD   10 mg at 01/17/17 1057  . calcium acetate (PHOSLO) capsule 667 mg  667 mg Oral TID WC Edrick Oh, MD      . carvedilol (COREG) tablet 25 mg  25 mg Oral BID WC Pearson Grippe B, MD   25 mg at 01/19/17 0744  . feeding supplement (PRO-STAT SUGAR FREE 64) liquid 30 mL  30 mL Oral BID Rai, Ripudeep K, MD   30 mL at 01/19/17 1100  . heparin injection 1,000 Units  1,000 Units Dialysis PRN Roney Jaffe, MD      . heparin injection   Intracatheter PRN Arne Cleveland, MD   2.8 mL at 01/18/17 1040  . hydrALAZINE (APRESOLINE) tablet 100 mg  100 mg Oral Q8H Powell, A  Clint Lipps., MD   100 mg at 01/19/17 0552  . HYDROmorphone (DILAUDID) injection 1 mg  1 mg Intravenous Q4H PRN Rai, Ripudeep K, MD      . isosorbide mononitrate (IMDUR) 24 hr tablet 60 mg  60 mg Oral Daily Kroeger, Krista M., PA-C      . labetalol (NORMODYNE,TRANDATE) injection 10 mg  10 mg Intravenous Q2H PRN Elodia Florence., MD   10 mg at 01/17/17 0551  . lidocaine (PF) (XYLOCAINE) 1 % injection 5 mL  5 mL Intradermal PRN Roney Jaffe, MD      . lidocaine-prilocaine (EMLA) cream 1 application  1 application Topical PRN Roney Jaffe, MD      . oxyCODONE-acetaminophen (PERCOCET/ROXICET) 5-325 MG per tablet 1 tablet  1 tablet Oral Q4H PRN Rai, Vernelle Emerald, MD   1 tablet at 01/19/17 0744  . pentafluoroprop-tetrafluoroeth (GEBAUERS) aerosol 1 application  1 application Topical PRN Roney Jaffe, MD        Musculoskeletal: Strength & Muscle Tone: UTA as patient was lying down and receiving dialysis. Gait & Station: UTA as patient was lying down and receiving dialysis. Patient leans: N/A  Psychiatric Specialty Exam: Physical Exam  Nursing note  and vitals reviewed. Constitutional: He is oriented to person, place, and time. He appears well-developed and well-nourished.  HENT:  Head: Normocephalic and atraumatic.  Neck: Normal range of motion.  Respiratory: Effort normal.  Musculoskeletal: Normal range of motion.  Neurological: He is alert and oriented to person, place, and time.  Skin: No rash noted.  Psychiatric: He has a normal mood and affect. His speech is normal and behavior is normal. Judgment and thought content normal. Cognition and memory are normal.    Review of Systems  Cardiovascular: Negative for chest pain.  Gastrointestinal: Negative for abdominal pain, constipation, diarrhea, nausea and vomiting.  Psychiatric/Behavioral: Negative for depression, hallucinations, substance abuse and suicidal ideas. The patient is not nervous/anxious and does not have insomnia.     Blood pressure 115/76, pulse 81, temperature 99.1 F (37.3 C), temperature source Oral, resp. rate 20, height 6' 2"  (1.88 m), weight 107.2 kg (236 lb 5.3 oz), SpO2 97 %.Body mass index is 30.34 kg/m.  General Appearance: Well Groomed, middle aged, African American male who is wearing a hospital gown, lying in bed and receiving dialysis. NAD.   Eye Contact:  Fair  Speech:  Clear and Coherent and Normal Rate  Volume:  Normal  Mood:  Euthymic  Affect:  Full Range  Thought Process:  Goal Directed and Linear  Orientation:  Full (Time, Place, and Person)  Thought Content:  Logical  Suicidal Thoughts:  No  Homicidal Thoughts:  No  Memory:  Immediate;   Good Recent;   Good Remote;   Good  Judgement:  Fair  Insight:  Fair  Psychomotor Activity:  Normal  Concentration:  Concentration: Good and Attention Span: Good  Recall:  Good  Fund of Knowledge:  Good  Language:  Good  Akathisia:  No  Handed:  Right  AIMS (if indicated):   N/A  Assets:  Communication Skills Desire for Improvement Financial Resources/Insurance Housing  ADL's:  Intact   Cognition:  WNL  Sleep:   Okay   Assessment: Travarus Trudo is a 57 y.o. male who was admitted with volume overload requiring hemodialysis. Psychiatry was consulted for capacity to refuse dialysis since patient was inconsistent with his decision for dialysis. He demonstrates capacity to refuse dialysis. Fortunately, he is willing to accept treatment since he is  able to see how beneficial it has been for improving his medical condition. If he continues to be inconsistent with his decision for dialysis then he would by default lack capacity due to inability to sustain as choice.    Treatment Plan Summary: -Patient demonstrates capacity to refuse dialysis but fortunately at this time he is willing to accept treatment. Please see assessment for further details.  -Psychiatry will sign off patient at this time. Please consult psychiatry again as needed.   Disposition: No evidence of imminent risk to self or others at present.   Patient does not meet criteria for psychiatric inpatient admission.  Faythe Dingwall, DO 01/19/2017 11:39 AM

## 2017-01-19 NOTE — Care Management Note (Addendum)
Case Management Note  Patient Details  Name: Jason [name] MRN: [id number] Date of Birth: [birth year]  Subjective/Objective:     Pt admitted with cellulitis, hypertensive emergency, worsening RF  and SOB.                Action/Plan:  PTA from home.  ESRD not yet on outpt HD - however it has been determined that pt will now require perm HD - pt will need to be clipped.  Pt will need appt at [facility] and possibly [facility] at discharge.  CM will continue to follow for discharge needs   Expected Discharge Date:  (unknown)               Expected Discharge Plan:  Home/Self Care  In-House Referral:  Clinical Social Work  Discharge planning Services  CM Consult  Post Acute Care Choice:    Choice offered to:  Patient  DME Arranged:    DME Agency:     HH Arranged:    [place] Agency:     Status of Service:  In process, will continue to follow  If discussed at Long Length of Stay Meetings, dates discussed:    Additional Comments: 01/19/2017 Pt refusing SNF (CSW received consult) and per note also refusing perm access for HD, however will allow daily HD here in house for now. CM requested Palliative Consult - attending has decided to persue psych consult first.   CM will continue to follow for discharge needs Maryclare Labrador, RN 01/19/2017, 11:15 AM

## 2017-01-19 NOTE — Progress Notes (Signed)
Progress Note  Patient Name: Jason [name] Date of Encounter: 01/19/2017  Primary Cardiologist: Kirk Ruths, MD   Subjective   No chest pain or dyspnea  Inpatient Medications    Scheduled Meds: . amLODipine  10 mg Oral Daily  . carvedilol  25 mg Oral BID WC  . feeding supplement (PRO-STAT SUGAR FREE 64)  30 mL Oral BID  . hydrALAZINE  100 mg Oral Q8H  . isosorbide mononitrate  60 mg Oral Daily   Continuous Infusions: . sodium chloride    . sodium chloride     PRN Meds: sodium chloride, sodium chloride, acetaminophen, alteplase, heparin, heparin, HYDROmorphone (DILAUDID) injection, labetalol, lidocaine (PF), lidocaine-prilocaine, oxyCODONE-acetaminophen, pentafluoroprop-tetrafluoroeth   Vital Signs    Vitals:   01/19/17 0321 01/19/17 0325 01/19/17 0550 01/19/17 0618  BP:  (!) 172/125 (!) 181/124 (!) 153/99  Pulse: 80     Resp: (!) 23     Temp: 99.1 F (37.3 C)     TempSrc: Oral     SpO2: 97%     Weight:      Height:        Intake/Output Summary (Last 24 hours) at 01/19/2017 0704 Last data filed at 01/19/2017 0556 Gross per 24 hour  Intake 960 ml  Output 4025 ml  Net -3065 ml   Filed Weights   01/17/17 0213 01/18/17 1600  Weight: 218 lb (98.9 kg) 236 lb 5.3 oz (107.2 kg)    Telemetry    Sinus with rare PAC and PVC- Personally Reviewed   Physical Exam   GEN: No acute distress.   Neck: supple Cardiac: RRR Respiratory: Diminished BS bases GI: Soft, nontender, non-distended  MS: 2 + edema; wrapped Neuro:  Nonfocal  Psych: Normal affect   Labs    Chemistry Recent Labs  Lab 01/16/17 1249 01/16/17 1314 01/16/17 1316 01/17/17 0531 01/19/17 0255  NA 135  --  136 135 136  K 5.4*  --  5.3* 5.6* 4.3  CL 102  --  102 101 102  CO2 21*  --   --  22 22  GLUCOSE 105*  --  101* 115* 92  BUN 118*  --  94* 118* 100*  CREATININE 6.98*  --  7.00* 6.50* 5.47*  CALCIUM 8.8*  --   --  8.3* 7.6*  PROT  --  6.6  --  6.4*  --   ALBUMIN  --  2.8*  --  2.7*  --    AST  --  24  --  22  --   ALT  --  64*  --  56  --   ALKPHOS  --  48  --  46  --   BILITOT  --  1.0  --  0.8  --   GFRNONAA 8*  --   --  9* 11*  GFRAA 9*  --   --  10* 12*  ANIONGAP 12  --   --  12 12     Hematology Recent Labs  Lab 01/16/17 1249 01/16/17 1316 01/17/17 0531 01/19/17 0255  WBC 6.0  --  17.6* 11.3*  RBC 4.66  --  4.69 4.17*  HGB 14.1 14.3 14.0 11.8*  HCT 39.8 42.0 40.1 35.4*  MCV 85.4  --  85.5 84.9  MCH 30.3  --  29.9 28.3  MCHC 35.4  --  34.9 33.3  RDW 13.3  --  13.4 13.3  PLT 370  --  362 290    Cardiac Enzymes Recent Labs  Lab 01/16/17 1944 01/17/17 0117 01/17/17 0531  TROPONINI 0.37* 0.30* 0.29*    Recent Labs  Lab 01/16/17 1314  TROPIPOC 0.43*     BNP Recent Labs  Lab 01/16/17 1249  BNP >4,500.0*       Radiology    Ir Fluoro Guide Cv Line Right  Result Date: 01/18/2017 CLINICAL DATA:  Renal failure, needs venous access for hemodialysis EXAM: EXAM RIGHT IJ CATHETER PLACEMENT UNDER ULTRASOUND AND FLUOROSCOPIC GUIDANCE TECHNIQUE: The procedure, risks (including but not limited to bleeding, infection, organ damage, pneumothorax), benefits, and alternatives were explained to the patient. Questions regarding the procedure were encouraged and answered. The patient understands and consents to the procedure. Patency of the right IJ vein was confirmed with ultrasound with image documentation. An appropriate skin site was determined. Skin site was marked. Region was prepped using maximum barrier technique including cap and mask, sterile gown, sterile gloves, large sterile sheet, and Chlorhexidine as cutaneous antisepsis. The region was infiltrated locally with 1% lidocaine. Under real-time ultrasound guidance, the right IJ vein was accessed with a 21 gauge needle; the needle tip within the vein was confirmed with ultrasound image documentation. The needle exchanged over a 018 guidewire for vascular dilator which allowed advancement of a 20 cm Mahurkar  catheter. This was positioned with the tip at the cavoatrial junction. Spot chest radiograph shows good positioning and no pneumothorax. Catheter was flushed and sutured externally with 0-Prolene sutures. Patient tolerated the procedure well. FLUOROSCOPY TIME:  Less than 6 seconds COMPLICATIONS: COMPLICATIONS none IMPRESSION: 1. Technically successful right IJ Mahurkar catheter placement. Electronically Signed   By: Lucrezia Europe M.D.   On: 01/18/2017 12:53   Ir US Guide Vasc Access Right  Result Date: 01/18/2017 CLINICAL DATA:  Renal failure, needs venous access for hemodialysis EXAM: EXAM RIGHT IJ CATHETER PLACEMENT UNDER ULTRASOUND AND FLUOROSCOPIC GUIDANCE TECHNIQUE: The procedure, risks (including but not limited to bleeding, infection, organ damage, pneumothorax), benefits, and alternatives were explained to the patient. Questions regarding the procedure were encouraged and answered. The patient understands and consents to the procedure. Patency of the right IJ vein was confirmed with ultrasound with image documentation. An appropriate skin site was determined. Skin site was marked. Region was prepped using maximum barrier technique including cap and mask, sterile gown, sterile gloves, large sterile sheet, and Chlorhexidine as cutaneous antisepsis. The region was infiltrated locally with 1% lidocaine. Under real-time ultrasound guidance, the right IJ vein was accessed with a 21 gauge needle; the needle tip within the vein was confirmed with ultrasound image documentation. The needle exchanged over a 018 guidewire for vascular dilator which allowed advancement of a 20 cm Mahurkar catheter. This was positioned with the tip at the cavoatrial junction. Spot chest radiograph shows good positioning and no pneumothorax. Catheter was flushed and sutured externally with 0-Prolene sutures. Patient tolerated the procedure well. FLUOROSCOPY TIME:  Less than 6 seconds COMPLICATIONS: COMPLICATIONS none IMPRESSION: 1.  Technically successful right IJ Mahurkar catheter placement. Electronically Signed   By: Lucrezia Europe M.D.   On: 01/18/2017 12:53    Patient Profile     57 year old male with past medical history of hypertension, medical noncompliance and chronic kidney disease for evaluation of cardiomyopathy and elevated troponin.  Was admitted on January 6 with complaints of dyspnea on exertion, orthopnea, weight gain and lower extremity edema.  Patient's initial blood pressure was 224/178. Echocardiogram was personally reviewed.  Ejection fraction appears to be 15-20%.  There is moderate mitral and tricuspid regurgitation. Troponin I 0.43,  0.37, 0.30 and 0.29.     Assessment & Plan    1 cardiomyopathy-etiology felt to be hypertensive mediated.  He has he was severely hypertensive at time of admission.  Not been compliant with his blood pressure medications.  Will continue carvedilol, amlodipine and hydralazine/nitrates.  I have not added an ACE inhibitor given his renal insufficiency.  Would continue medications for 3 months and then repeat echocardiogram assuming his blood pressure has been controlled and he has been compliant with medications.  His LV function will likely improve once blood pressure is controlled.  If not would need ischemia evaluation at that time.  2 elevated troponin-minimal elevation in setting of congestive heart failure and end-stage renal disease.  No plans for further ischemia evaluation at this point.  3 hypertensive emergency-blood pressure severely elevated at time of admission.  Continue carvedilol, amlodipine, hydralazine/nitrates.  Dialysis initiated.  This should also help with BP. Will need to follow closely and may need to reduce meds if BP falls with volume removal.  4 acute systolic/diastolic congestive heart failure-patient remains markedly volume overloaded likely from combination of congestive heart failure and end-stage renal disease.  Dialysis initiated.  5  end-stage renal disease-Nephrology managing.  6 lower extremity cellulitis-Antibiotics per IM.    For questions or updates, please contact [place] Please consult [email or web address] for contact info under Cardiology/STEMI.      Signed, Kirk Ruths, MD  01/19/2017, 7:04 AM

## 2017-01-19 NOTE — Progress Notes (Signed)
Jason [name] ROUNDING NOTE   Subjective:   CKD stage 5  Presents to ER with volume overload . Started  dialysis  Will need  CLIP  Appears ready to start dialysis once permcath placed Patient refused permanent access   He is unsure to whether he wants dialysis for the rest of his life. Certainly uremia may be playing a role in his poor understanding and decisions. We will plan daily dialysis to see if mentally he will clear.    Objective:  Vital signs in last 24 hours:  Temp:  [97.3 F (36.3 C)-99.1 F (37.3 C)] 99.1 F (37.3 C) (01/09 0717) Pulse Rate:  [65-102] 81 (01/09 0744) Resp:  [20-29] 23 (01/09 0321) BP: (109-181)/(45-125) 163/99 (01/09 0717) SpO2:  [96 %-100 %] 97 % (01/09 0321) Weight:  [236 lb 5.3 oz (107.2 kg)] 236 lb 5.3 oz (107.2 kg) (01/08 1600)  Weight change:  Filed Weights   01/17/17 0213 01/18/17 1600  Weight: 218 lb (98.9 kg) 236 lb 5.3 oz (107.2 kg)    Intake/Output: I/O last 3 completed shifts: In: 4098 [P.O.:1200; IV Piggyback:132] Out: 1191 [Urine:1825; Other:3000]   Intake/Output this shift:  No intake/output data recorded.  CVS- RRR RS- CTA ABD- BS present soft non-distended EXT- lower extremity edema wrapped legs    Basic Metabolic Panel: Recent Labs  Lab 01/16/17 1249 01/16/17 1314 01/16/17 1316 01/17/17 0531 01/18/17 1650 01/19/17 0255  NA 135  --  136 135  --  136  K 5.4*  --  5.3* 5.6*  --  4.3  CL 102  --  102 101  --  102  CO2 21*  --   --  22  --  22  GLUCOSE 105*  --  101* 115*  --  92  BUN 118*  --  94* 118*  --  100*  CREATININE 6.98*  --  7.00* 6.50*  --  5.47*  CALCIUM 8.8*  --   --  8.3*  --  7.6*  PHOS  --  7.9*  --   --  5.9*  --     Liver Function Tests: Recent Labs  Lab 01/16/17 1314 01/17/17 0531  AST 24 22  ALT 64* 56  ALKPHOS 48 46  BILITOT 1.0 0.8  PROT 6.6 6.4*  ALBUMIN 2.8* 2.7*   No results for input(s): LIPASE, AMYLASE in the last 168 hours. No results for input(s): AMMONIA  in the last 168 hours.  CBC: Recent Labs  Lab 01/16/17 1249 01/16/17 1316 01/17/17 0531 01/19/17 0255  WBC 6.0  --  17.6* 11.3*  NEUTROABS 5.1  --   --   --   HGB 14.1 14.3 14.0 11.8*  HCT 39.8 42.0 40.1 35.4*  MCV 85.4  --  85.5 84.9  PLT 370  --  362 290    Cardiac Enzymes: Recent Labs  Lab 01/16/17 1944 01/17/17 0117 01/17/17 0531  TROPONINI 0.37* 0.30* 0.29*    BNP: Invalid input(s): POCBNP  CBG: Recent Labs  Lab 01/19/17 4782  NFAOZH 08    Microbiology: Results for orders placed or performed during the hospital encounter of 01/16/17  Culture, blood (routine x 2)     Status: None (Preliminary result)   Collection Time: 01/16/17  5:11 PM  Result Value Ref Range Status   Specimen Description RIGHT ANTECUBITAL  Final   Special Requests   Final    BOTTLES DRAWN AEROBIC AND ANAEROBIC Blood Culture adequate volume   Culture   Final  NO GROWTH 1 DAY Performed at [facility], [place] [address], [place], [place] [address]    Report Status PENDING  Incomplete  Culture, blood (routine x 2)     Status: None (Preliminary result)   Collection Time: 01/16/17  7:44 PM  Result Value Ref Range Status   Specimen Description BLOOD RIGHT ANTECUBITAL  Final   Special Requests   Final    BOTTLES DRAWN AEROBIC AND ANAEROBIC Blood Culture adequate volume   Culture   Final    NO GROWTH 1 DAY Performed at [facility], [place] [address], [place], [place] [address]    Report Status PENDING  Incomplete  MRSA PCR Screening     Status: None   Collection Time: 01/17/17  9:31 PM  Result Value Ref Range Status   MRSA by PCR NEGATIVE NEGATIVE Final    Comment:        The GeneXpert MRSA Assay (FDA approved for NASAL specimens only), is one component of a comprehensive MRSA colonization surveillance program. It is not intended to diagnose MRSA infection nor to guide or monitor treatment for MRSA infections.     Coagulation Studies: No results for input(s):  LABPROT, INR in the last 72 hours.  Urinalysis: Recent Labs    01/16/17 1420  COLORURINE YELLOW  LABSPEC 1.013  PHURINE 5.0  GLUCOSEU NEGATIVE  HGBUR SMALL*  BILIRUBINUR NEGATIVE  KETONESUR NEGATIVE  PROTEINUR 100*  NITRITE NEGATIVE  LEUKOCYTESUR NEGATIVE      Imaging: Ir Fluoro Guide Cv Line Right  Result Date: 01/18/2017 CLINICAL DATA:  Renal failure, needs venous access for hemodialysis EXAM: EXAM RIGHT IJ CATHETER PLACEMENT UNDER ULTRASOUND AND FLUOROSCOPIC GUIDANCE TECHNIQUE: The procedure, risks (including but not limited to bleeding, infection, organ damage, pneumothorax), benefits, and alternatives were explained to the patient. Questions regarding the procedure were encouraged and answered. The patient understands and consents to the procedure. Patency of the right IJ vein was confirmed with ultrasound with image documentation. An appropriate skin site was determined. Skin site was marked. Region was prepped using maximum barrier technique including cap and mask, sterile gown, sterile gloves, large sterile sheet, and Chlorhexidine as cutaneous antisepsis. The region was infiltrated locally with 1% lidocaine. Under real-time ultrasound guidance, the right IJ vein was accessed with a 21 gauge needle; the needle tip within the vein was confirmed with ultrasound image documentation. The needle exchanged over a 018 guidewire for vascular dilator which allowed advancement of a 20 cm Mahurkar catheter. This was positioned with the tip at the cavoatrial junction. Spot chest radiograph shows good positioning and no pneumothorax. Catheter was flushed and sutured externally with 0-Prolene sutures. Patient tolerated the procedure well. FLUOROSCOPY TIME:  Less than 6 seconds COMPLICATIONS: COMPLICATIONS none IMPRESSION: 1. Technically successful right IJ Mahurkar catheter placement. Electronically Signed   By: Lucrezia Europe M.D.   On: 01/18/2017 12:53   Ir US Guide Vasc Access Right  Result Date:  01/18/2017 CLINICAL DATA:  Renal failure, needs venous access for hemodialysis EXAM: EXAM RIGHT IJ CATHETER PLACEMENT UNDER ULTRASOUND AND FLUOROSCOPIC GUIDANCE TECHNIQUE: The procedure, risks (including but not limited to bleeding, infection, organ damage, pneumothorax), benefits, and alternatives were explained to the patient. Questions regarding the procedure were encouraged and answered. The patient understands and consents to the procedure. Patency of the right IJ vein was confirmed with ultrasound with image documentation. An appropriate skin site was determined. Skin site was marked. Region was prepped using maximum barrier technique including cap and mask, sterile  gown, sterile gloves, large sterile sheet, and Chlorhexidine as cutaneous antisepsis. The region was infiltrated locally with 1% lidocaine. Under real-time ultrasound guidance, the right IJ vein was accessed with a 21 gauge needle; the needle tip within the vein was confirmed with ultrasound image documentation. The needle exchanged over a 018 guidewire for vascular dilator which allowed advancement of a 20 cm Mahurkar catheter. This was positioned with the tip at the cavoatrial junction. Spot chest radiograph shows good positioning and no pneumothorax. Catheter was flushed and sutured externally with 0-Prolene sutures. Patient tolerated the procedure well. FLUOROSCOPY TIME:  Less than 6 seconds COMPLICATIONS: COMPLICATIONS none IMPRESSION: 1. Technically successful right IJ Mahurkar catheter placement. Electronically Signed   By: Lucrezia Europe M.D.   On: 01/18/2017 12:53     Medications:   . sodium chloride    . sodium chloride     . amLODipine  10 mg Oral Daily  . carvedilol  25 mg Oral BID WC  . feeding supplement (PRO-STAT SUGAR FREE 64)  30 mL Oral BID  . hydrALAZINE  100 mg Oral Q8H  . isosorbide mononitrate  60 mg Oral Daily   sodium chloride, sodium chloride, acetaminophen, alteplase, heparin, heparin, HYDROmorphone (DILAUDID)  injection, labetalol, lidocaine (PF), lidocaine-prilocaine, oxyCODONE-acetaminophen, pentafluoroprop-tetrafluoroeth  Assessment/ Plan:  1. CKD - lost to f/u. Has progressive CKD, now likely ESRD Started HD  Need permanent  access placed . Left handed, save R arm  2. Severe HTN - improved with dialysis and volume challenged and will continue oral antihypertensives for now  3. Anemia  Hb 11 4. Bones  Check Phos 5.9  and PTH Pending and Calcium 8.6  Start calcium acetate 667 mg 1 with meals      LOS: 3 Rushie Brazel W @TODAY @9 :06 AM

## 2017-01-19 NOTE — Progress Notes (Addendum)
    Patient is still not interested in permanent HD access. He does not want vein mapping procedure to be completed at this time.    We will be available if he changes his mind.  Roxy Horseman PA-C  Please reconsult if pt willing to proceed with access

## 2017-01-19 NOTE — Progress Notes (Addendum)
[facility]                                                                              Patient Demographics  Jason [name], is a 57 y.o. male, DOB - 12-29-1960, HYI:[id number]  Admit date - 01/16/2017   Admitting Physician A Melven Sartorius., MD  Outpatient Primary MD for the patient is Wandra Mannan, NP  Outpatient specialists:   LOS - 3  days   Medical records reviewed and are as summarized below:    Chief Complaint  Patient presents with  . Leg Swelling       Brief summary   Jason [name] a 57 y.o.malewith medical history significant ofhypertension, chronic kidney disease, anemia, inguinal hernia presenting with worsening shortness of breath, lower extremity edema, and altered mental status.in the ED, hypertensive emergency with worsening renal function, heart failure, acute encephalopathy and bilateral chronic venous stasis with stasis dermatitis.   Patient refuses permanent access for dialysis and not sure if he wants dialysis for the rest of his life. Psychiatry consulted to determine capacity. Will also involve palliative care team.  Assessment & Plan    Principal Problem:   Hypertensive emergency -BP 224/171 at the time of admission  - BP currently improved, continue amlodipine, Coreg, hydralazine - will continue to improve once patient is started on hemodialysis  - on clonidine po at home  Active Problems:   Acute kidney injury ([place]) on chronic kidney disease, stage IV, progressed to new ESRD -Nephrology consulted, plan to start hemodialysis.  Lasix discontinued. -Vascular surgery consulted for permanent axis -IR consulted for [facility], will start dialysis when patient is agreeable   Acute hypoxic respiratory failure secondary to acute on chronic HFREF 35-40%, hypervolemia due to ESRD -2D echo showed EF of 35-40% with moderate diffuse hypokinesis, grade 2 diastolic dysfunction -Per Dr. Florene Glen, cardiology has been consulted and will follow  further recommendation -Patient to start hemodialysis if agreeable, will help with peripheral edema and CHF exacerbation    Elevated troponin -Troponin bumped to 0.43 suspect due to ESRD and demand ischemia -2D echo with EF of 35-40%  (01/17/17) with moderate diffuse hypokinesis  -Cardiology following  Bilateral chronic venous stasis with stasis dermatitis with suspected super imposed cellulitis -CT with severe soft tissue edema -Wound care following, dressings addressed -Continue IV vancomycin pharmacy -CBC, BMET pending, blood cultures in process  Left knee pain -Rule out acute gout, placed on pain control, obtain ESR, CRP, uric acid  Medical non compliance -Refuses permanent access for dialysis -Non compliant with medical management -Psychiatry consulted to determine capacity. -Dr. Mariea Clonts following. We appreciate recommendations.  Code Status: Full CODE STATUS DVT Prophylaxis: Heparin Family Communication: No family members at bedside Disposition Plan: will stay another midnight for IV antibiotics, possible hemodialysis and to continue current management  Time Spent in minutes  35 minutes  Procedures:  2D echo: Systolic function was moderately reduced.The estimated ejection fraction was in the range of 35% to 40%.Moderate diffuse hypokinesis. Features are consistent with a pseudonormal left ventricular filling pattern, with concomitant abnormal relaxation and increased filling pressure (grade 2 diastolic dysfunction)  ABI: Normal   Consultants:  Nephrology Vascular surgery Interventional radiology  Antimicrobials:   IV vancomycin day# 3   Medications  Scheduled Meds: . amLODipine  10 mg Oral Daily  . carvedilol  25 mg Oral BID WC  . feeding supplement (PRO-STAT SUGAR FREE 64)  30 mL Oral BID  . hydrALAZINE  100 mg Oral Q8H  . isosorbide mononitrate  60 mg Oral Daily   Continuous Infusions: . sodium chloride    . sodium chloride     PRN Meds:.sodium  chloride, sodium chloride, acetaminophen, alteplase, heparin, heparin, HYDROmorphone (DILAUDID) injection, labetalol, lidocaine (PF), lidocaine-prilocaine, oxyCODONE-acetaminophen, pentafluoroprop-tetrafluoroeth   Antibiotics   Anti-infectives (From admission, onward)   Start     Dose/Rate Route Frequency Ordered Stop   01/16/17 1800  vancomycin (VANCOCIN) 2,000 mg in sodium chloride 0.9 % 500 mL IVPB     2,000 mg 250 mL/hr over 120 Minutes Intravenous  Once 01/16/17 1734 01/16/17 2344   01/16/17 1730  vancomycin (VANCOCIN) IVPB 1000 mg/200 mL premix  Status:  Discontinued     1,000 mg 200 mL/hr over 60 Minutes Intravenous  Once 01/16/17 1720 01/16/17 1734        Subjective:   Jason [name] was seen and examined with no family members at his bedside. States no longer feeling dyspneic. Denies chest pain or cough.  Persistent draining from lower extremities bilaterally. BP improving.   Objective:   Vitals:   01/19/17 0325 01/19/17 0550 01/19/17 0618 01/19/17 0717  BP: (!) 172/125 (!) 181/124 (!) 153/99 (!) 163/99  Pulse:      Resp:      Temp:    99.1 F (37.3 C)  TempSrc:    Oral  SpO2:      Weight:      Height:        Intake/Output Summary (Last 24 hours) at 01/19/2017 0723 Last data filed at 01/19/2017 0556 Gross per 24 hour  Intake 960 ml  Output 4025 ml  Net -3065 ml     Wt Readings from Last 3 Encounters:  01/18/17 107.2 kg (236 lb 5.3 oz)  04/04/16 93 kg (205 lb)  02/26/15 95.3 kg (210 lb)     Exam  General: Alert and oriented x 3, NAD  Eyes: pupils are round and reactive to light  HEENT:  Mucous membranes are dry no erythema or exudates  Cardiovascular: S1 S2 clear, RRR   Respiratory: Bibasilar crackles  Gastrointestinal: Soft, nontender, nondistended, + bowel sounds  Ext: bilateral lower extremity edema up to tights with drainage to bilateral lower extremity  Neuro: no new deficits  Musculoskeletal: No digital cyanosis, clubbing  Skin:  Erythema, drainage on b/l LE  Psych: Normal affect and demeanor, alert and oriented x3    Data Reviewed:  I have personally reviewed following labs and imaging studies  Micro Results Recent Results (from the past 240 hour(s))  Culture, blood (routine x 2)     Status: None (Preliminary result)   Collection Time: 01/16/17  5:11 PM  Result Value Ref Range Status   Specimen Description RIGHT ANTECUBITAL  Final   Special Requests   Final    BOTTLES DRAWN AEROBIC AND ANAEROBIC Blood Culture adequate volume   Culture   Final    NO GROWTH 1 DAY Performed at [facility], [address], [place], [place] [address]    Report Status PENDING  Incomplete  Culture, blood (routine x 2)     Status: None (Preliminary result)   Collection Time: 01/16/17  7:44 PM  Result Value Ref Range Status   Specimen Description BLOOD RIGHT ANTECUBITAL  Final   Special Requests   Final    BOTTLES DRAWN AEROBIC AND ANAEROBIC Blood Culture adequate volume   Culture   Final    NO GROWTH 1 DAY Performed at [facility], [address], [place], [place] [address]    Report Status PENDING  Incomplete  MRSA PCR Screening     Status: None   Collection Time: 01/17/17  9:31 PM  Result Value Ref Range Status   MRSA by PCR NEGATIVE NEGATIVE Final    Comment:        The GeneXpert MRSA Assay (FDA approved for NASAL specimens only), is one component of a comprehensive MRSA colonization surveillance program. It is not intended to diagnose MRSA infection nor to guide or monitor treatment for MRSA infections.     Radiology Reports Dg Chest 2 View  Result Date: 01/16/2017 CLINICAL DATA:  Shortness of breath, BILATERAL lower extremity edema with weeping wounds, on oxygen, history hypertension, former smoker EXAM: CHEST  2 VIEW COMPARISON:  Non FINDINGS: Enlargement of cardiac silhouette with pulmonary vascular congestion. Rotated to the RIGHT. Subsegmental atelectasis LEFT mid lung and at both bases  greater on LEFT. Minimal LEFT pleural effusion. No definite acute infiltrate, pleural effusion or pneumothorax. IMPRESSION: Enlargement of cardiac silhouette with vascular congestion. Scattered atelectasis greater on LEFT with minimal LEFT pleural effusion. Electronically Signed   By: Lavonia Dana M.D.   On: 01/16/2017 13:09   Ct Head Wo Contrast  Result Date: 01/16/2017 CLINICAL DATA:  Headache hypertension EXAM: CT HEAD WITHOUT CONTRAST TECHNIQUE: Contiguous axial images were obtained from the base of the skull through the vertex without intravenous contrast. COMPARISON:  None. FINDINGS: Motion degraded images limit assessment. Brain: No evidence of acute large vascular territory infarction, hemorrhage, hydrocephalus, extra-axial collection or mass lesion/mass effect. No intraventricular hemorrhage. No effacement of the basal cisterns or fourth ventricle. Hyperdensity along the falx likely related to falcine calcifications, without eccentric or focal thickening to suggest hemorrhage. Minimal periventricular white matter hypodensities consistent with small vessel ischemic disease. Vascular: No hyperdense vessel or unexpected calcification. Skull: Negative for fracture or focal lesion. Sinuses/Orbits: No acute finding. Other: None IMPRESSION: Limited by motion artifacts. Minimal small vessel ischemic disease. No acute intracranial abnormality. Electronically Signed   By: Ashley Royalty M.D.   On: 01/16/2017 18:09   US Renal  Result Date: 01/16/2017 CLINICAL DATA:  Acute renal failure.  History of hypertension. EXAM: RENAL / URINARY TRACT ULTRASOUND COMPLETE COMPARISON:  01/29/2015 FINDINGS: Right Kidney: Length: 9.9 cm. Echogenic renal parenchyma. No focal mass or hydronephrosis. Left Kidney: Length: 9.2 cm. Echogenic renal parenchyma. No focal mass or hydronephrosis. Bladder: Small amount of debris is noted within the bladder. Additional: Bilateral pleural effusions are noted. IMPRESSION: 1. Echogenic renal  parenchyma bilaterally, compatible with the history of renal failure. No hydronephrosis. 2. Bilateral pleural effusions. Electronically Signed   By: Nolon Nations M.D.   On: 01/16/2017 18:47   Ct Tibia Fibula Left Wo Contrast  Result Date: 01/16/2017 CLINICAL DATA:  Cellulitis.  Evaluate for osteomyelitis. EXAM: CT OF THE LOWER LEFT EXTREMITY WITHOUT CONTRAST TECHNIQUE: Multidetector CT imaging of the lower left extremity was performed according to the standard protocol. COMPARISON:  None. FINDINGS: Bones/Joint/Cartilage No fracture or dislocation. Normal alignment. No joint effusion. Moderate medial femorotibial compartment joint space narrowing with marginal osteophytes and subchondral sclerosis. Mild lateral femorotibial compartment joint space narrowing with small marginal osteophytes. Small patellofemoral  compartment marginal osteophytes. Subchondral cystic changes in the medial corner of the talar dome. Small anterior tibial plafond marginal osteophyte. No aggressive osseous lesion. No periosteal reaction or bone destruction. Ligaments Ligaments are suboptimally evaluated by CT. Muscles and Tendons Muscles are normal.  No muscle atrophy. Soft tissue Severe soft tissue edema within the subcutaneous fat of the left lower leg. Severe soft tissue edema within the subcutaneous fat of the visualized portions of the right lower leg. No soft tissue mass. IMPRESSION: 1. Severe soft tissue edema within the subcutaneous fat of the left lower leg. Severe soft tissue edema within the subcutaneous fat of the visualized portions of the right lower leg. Differential considerations include severe cellulitis versus venous stasis. Electronically Signed   By: Kathreen Devoid   On: 01/16/2017 17:32   Ir Fluoro Guide Cv Line Right  Result Date: 01/18/2017 CLINICAL DATA:  Renal failure, needs venous access for hemodialysis EXAM: EXAM RIGHT IJ CATHETER PLACEMENT UNDER ULTRASOUND AND FLUOROSCOPIC GUIDANCE TECHNIQUE: The  procedure, risks (including but not limited to bleeding, infection, organ damage, pneumothorax), benefits, and alternatives were explained to the patient. Questions regarding the procedure were encouraged and answered. The patient understands and consents to the procedure. Patency of the right IJ vein was confirmed with ultrasound with image documentation. An appropriate skin site was determined. Skin site was marked. Region was prepped using maximum barrier technique including cap and mask, sterile gown, sterile gloves, large sterile sheet, and Chlorhexidine as cutaneous antisepsis. The region was infiltrated locally with 1% lidocaine. Under real-time ultrasound guidance, the right IJ vein was accessed with a 21 gauge needle; the needle tip within the vein was confirmed with ultrasound image documentation. The needle exchanged over a 018 guidewire for vascular dilator which allowed advancement of a 20 cm Mahurkar catheter. This was positioned with the tip at the cavoatrial junction. Spot chest radiograph shows good positioning and no pneumothorax. Catheter was flushed and sutured externally with 0-Prolene sutures. Patient tolerated the procedure well. FLUOROSCOPY TIME:  Less than 6 seconds COMPLICATIONS: COMPLICATIONS none IMPRESSION: 1. Technically successful right IJ Mahurkar catheter placement. Electronically Signed   By: Lucrezia Europe M.D.   On: 01/18/2017 12:53   Ir US Guide Vasc Access Right  Result Date: 01/18/2017 CLINICAL DATA:  Renal failure, needs venous access for hemodialysis EXAM: EXAM RIGHT IJ CATHETER PLACEMENT UNDER ULTRASOUND AND FLUOROSCOPIC GUIDANCE TECHNIQUE: The procedure, risks (including but not limited to bleeding, infection, organ damage, pneumothorax), benefits, and alternatives were explained to the patient. Questions regarding the procedure were encouraged and answered. The patient understands and consents to the procedure. Patency of the right IJ vein was confirmed with ultrasound with  image documentation. An appropriate skin site was determined. Skin site was marked. Region was prepped using maximum barrier technique including cap and mask, sterile gown, sterile gloves, large sterile sheet, and Chlorhexidine as cutaneous antisepsis. The region was infiltrated locally with 1% lidocaine. Under real-time ultrasound guidance, the right IJ vein was accessed with a 21 gauge needle; the needle tip within the vein was confirmed with ultrasound image documentation. The needle exchanged over a 018 guidewire for vascular dilator which allowed advancement of a 20 cm Mahurkar catheter. This was positioned with the tip at the cavoatrial junction. Spot chest radiograph shows good positioning and no pneumothorax. Catheter was flushed and sutured externally with 0-Prolene sutures. Patient tolerated the procedure well. FLUOROSCOPY TIME:  Less than 6 seconds COMPLICATIONS: COMPLICATIONS none IMPRESSION: 1. Technically successful right IJ Mahurkar catheter placement. Electronically Signed  By: Lucrezia Europe M.D.   On: 01/18/2017 12:53    Lab Data:  CBC: Recent Labs  Lab 01/16/17 1249 01/16/17 1316 01/17/17 0531 01/19/17 0255  WBC 6.0  --  17.6* 11.3*  NEUTROABS 5.1  --   --   --   HGB 14.1 14.3 14.0 11.8*  HCT 39.8 42.0 40.1 35.4*  MCV 85.4  --  85.5 84.9  PLT 370  --  362 599   Basic Metabolic Panel: Recent Labs  Lab 01/16/17 1249 01/16/17 1314 01/16/17 1316 01/17/17 0531 01/18/17 1650 01/19/17 0255  NA 135  --  136 135  --  136  K 5.4*  --  5.3* 5.6*  --  4.3  CL 102  --  102 101  --  102  CO2 21*  --   --  22  --  22  GLUCOSE 105*  --  101* 115*  --  92  BUN 118*  --  94* 118*  --  100*  CREATININE 6.98*  --  7.00* 6.50*  --  5.47*  CALCIUM 8.8*  --   --  8.3*  --  7.6*  PHOS  --  7.9*  --   --  5.9*  --    GFR: Estimated Creatinine Clearance: 19.9 mL/min (A) (by C-G formula based on SCr of 5.47 mg/dL (H)). Liver Function Tests: Recent Labs  Lab 01/16/17 1314  01/17/17 0531  AST 24 22  ALT 64* 56  ALKPHOS 48 46  BILITOT 1.0 0.8  PROT 6.6 6.4*  ALBUMIN 2.8* 2.7*   No results for input(s): LIPASE, AMYLASE in the last 168 hours. No results for input(s): AMMONIA in the last 168 hours. Coagulation Profile: No results for input(s): INR, PROTIME in the last 168 hours. Cardiac Enzymes: Recent Labs  Lab 01/16/17 1944 01/17/17 0117 01/17/17 0531  TROPONINI 0.37* 0.30* 0.29*   BNP (last 3 results) No results for input(s): PROBNP in the last 8760 hours. HbA1C: Recent Labs    01/17/17 0117  HGBA1C 4.9   CBG: No results for input(s): GLUCAP in the last 168 hours. Lipid Profile: No results for input(s): CHOL, HDL, LDLCALC, TRIG, CHOLHDL, LDLDIRECT in the last 72 hours. Thyroid Function Tests: No results for input(s): TSH, T4TOTAL, FREET4, T3FREE, THYROIDAB in the last 72 hours. Anemia Panel: No results for input(s): VITAMINB12, FOLATE, FERRITIN, TIBC, IRON, RETICCTPCT in the last 72 hours. Urine analysis:    Component Value Date/Time   COLORURINE YELLOW 01/16/2017 1420   APPEARANCEUR CLEAR 01/16/2017 1420   LABSPEC 1.013 01/16/2017 1420   PHURINE 5.0 01/16/2017 1420   GLUCOSEU NEGATIVE 01/16/2017 1420   HGBUR SMALL (A) 01/16/2017 1420   BILIRUBINUR NEGATIVE 01/16/2017 1420   KETONESUR NEGATIVE 01/16/2017 1420   PROTEINUR 100 (A) 01/16/2017 1420   UROBILINOGEN 0.2 02/26/2015 1028   NITRITE NEGATIVE 01/16/2017 1420   LEUKOCYTESUR NEGATIVE 01/16/2017 1420     Kayleen Memos M.D. [facility] 01/19/2017, 7:23 AM  Pager: [phone] Between 7am to 7pm - call Pager - [phone]  After 7pm go to [email or web address] - password [email or web address]  Call night coverage person covering after 7pm

## 2017-01-19 NOTE — Progress Notes (Signed)
Pharmacy Antibiotic Note  Jason [name] is a 57 y.o. male on day # 4 Vancomycin for cellulitis.  Vancomycin 2 gm IV x 1 given on 1/6 ~8pm.  New to hemodialysis this admit. First HD session on 1/8 (2.5 hrs, BFR 250 ml/min). Estimated post-HD vanc level was adequate on 1/8 and no additional Vanc given. Currently in 2nd session (2.5 hrs, BFR 250 ml/min). Estimated vanc level post-HD today is ~15 mcg/ml.  Noted plan for 3 hrs at BFR 300 ml/min on 1/10.    Plan:  Vancomycin 1 gm IV x 1 with HD this afternoon.  Random Vanc level in am, to assess dosing estimations.  Re-dose vancomycin as needed.  Height: 6\' 2"  (188 cm) Weight: 236 lb 5.3 oz (107.2 kg) IBW/kg (Calculated) : 82.2  Temp (24hrs), Avg:98.1 F (36.7 C), Min:97.3 F (36.3 C), Max:99.1 F (37.3 C)  Recent Labs  Lab 01/16/17 1249 01/16/17 1316 01/17/17 0531 01/19/17 0255  WBC 6.0  --  17.6* 11.3*  CREATININE 6.98* 7.00* 6.50* 5.47*     Allergies  Allergen Reactions  . Lisinopril Cough    Antimicrobials this admission:  Vancomycin 1/6>>  Dose adjustments this admission:  n/a  Microbiology results: 1/6 blood x 2 - ng x 2 days to date 1/7 MRSA PCR: neg  Thank you for allowing pharmacy to be a part of this patient's care.  Arty Baumgartner, [place] Pager: [phone], or [phone] 01/19/2017 3:23 PM

## 2017-01-20 ENCOUNTER — Inpatient Hospital Stay (HOSPITAL_COMMUNITY): Payer: Medicaid Other | Admitting: Certified Registered"

## 2017-01-20 ENCOUNTER — Telehealth: Payer: Self-pay | Admitting: Vascular Surgery

## 2017-01-20 ENCOUNTER — Encounter (HOSPITAL_COMMUNITY): Payer: Self-pay | Admitting: Surgery

## 2017-01-20 ENCOUNTER — Encounter (HOSPITAL_COMMUNITY)
Admission: EM | Disposition: A | Discharge status: Home / Self Care | Payer: Self-pay | Source: Home / Self Care | Attending: Internal Medicine

## 2017-01-20 ENCOUNTER — Inpatient Hospital Stay (HOSPITAL_COMMUNITY): Payer: Medicaid Other

## 2017-01-20 DIAGNOSIS — N185 Chronic kidney disease, stage 5: Secondary | ICD-10-CM

## 2017-01-20 HISTORY — PX: AV FISTULA PLACEMENT: SHX1204

## 2017-01-20 HISTORY — PX: INSERTION OF DIALYSIS CATHETER: SHX1324

## 2017-01-20 LAB — COMPREHENSIVE METABOLIC PANEL
ALK PHOS: 45 U/L (ref 38–126)
ALT: 24 U/L (ref 17–63)
ANION GAP: 9 (ref 5–15)
AST: 12 U/L — ABNORMAL LOW (ref 15–41)
Albumin: 1.9 g/dL — ABNORMAL LOW (ref 3.5–5.0)
BILIRUBIN TOTAL: 0.7 mg/dL (ref 0.3–1.2)
BUN: 83 mg/dL — ABNORMAL HIGH (ref 6–20)
CALCIUM: 7.2 mg/dL — AB (ref 8.9–10.3)
CO2: 25 mmol/L (ref 22–32)
Chloride: 103 mmol/L (ref 101–111)
Creatinine, Ser: 4.96 mg/dL — ABNORMAL HIGH (ref 0.61–1.24)
GFR calc Af Amer: 14 mL/min — ABNORMAL LOW (ref >60)
GFR, EST NON AFRICAN AMERICAN: 12 mL/min — AB (ref >60)
Glucose, Bld: 117 mg/dL — ABNORMAL HIGH (ref 65–99)
Potassium: 3.8 mmol/L (ref 3.5–5.1)
Sodium: 137 mmol/L (ref 135–145)
TOTAL PROTEIN: 5.1 g/dL — AB (ref 6.5–8.1)

## 2017-01-20 LAB — CBC
HCT: 33.3 % — ABNORMAL LOW (ref 39.0–52.0)
Hemoglobin: 11.1 g/dL — ABNORMAL LOW (ref 13.0–17.0)
MCH: 28.2 pg (ref 26.0–34.0)
MCHC: 33.3 g/dL (ref 30.0–36.0)
MCV: 84.5 fL (ref 78.0–100.0)
PLATELETS: 272 10*3/uL (ref 150–400)
RBC: 3.94 MIL/uL — AB (ref 4.22–5.81)
RDW: 13.1 % (ref 11.5–15.5)
WBC: 9.5 10*3/uL (ref 4.0–10.5)

## 2017-01-20 LAB — VANCOMYCIN, RANDOM: VANCOMYCIN RM: 21

## 2017-01-20 LAB — PARATHYROID HORMONE, INTACT (NO CA): PTH: 273 pg/mL — AB (ref 15–65)

## 2017-01-20 LAB — GLUCOSE, CAPILLARY: Glucose-Capillary: 87 mg/dL (ref 65–99)

## 2017-01-20 IMAGING — DX DG CHEST 1V PORT
1 series · 1 of 1 positions shown · non-contrast
Comparison: [DATE]

CLINICAL DATA: Status post dialysis catheter placement

EXAM:
PORTABLE CHEST 1 VIEW

[chest ap]
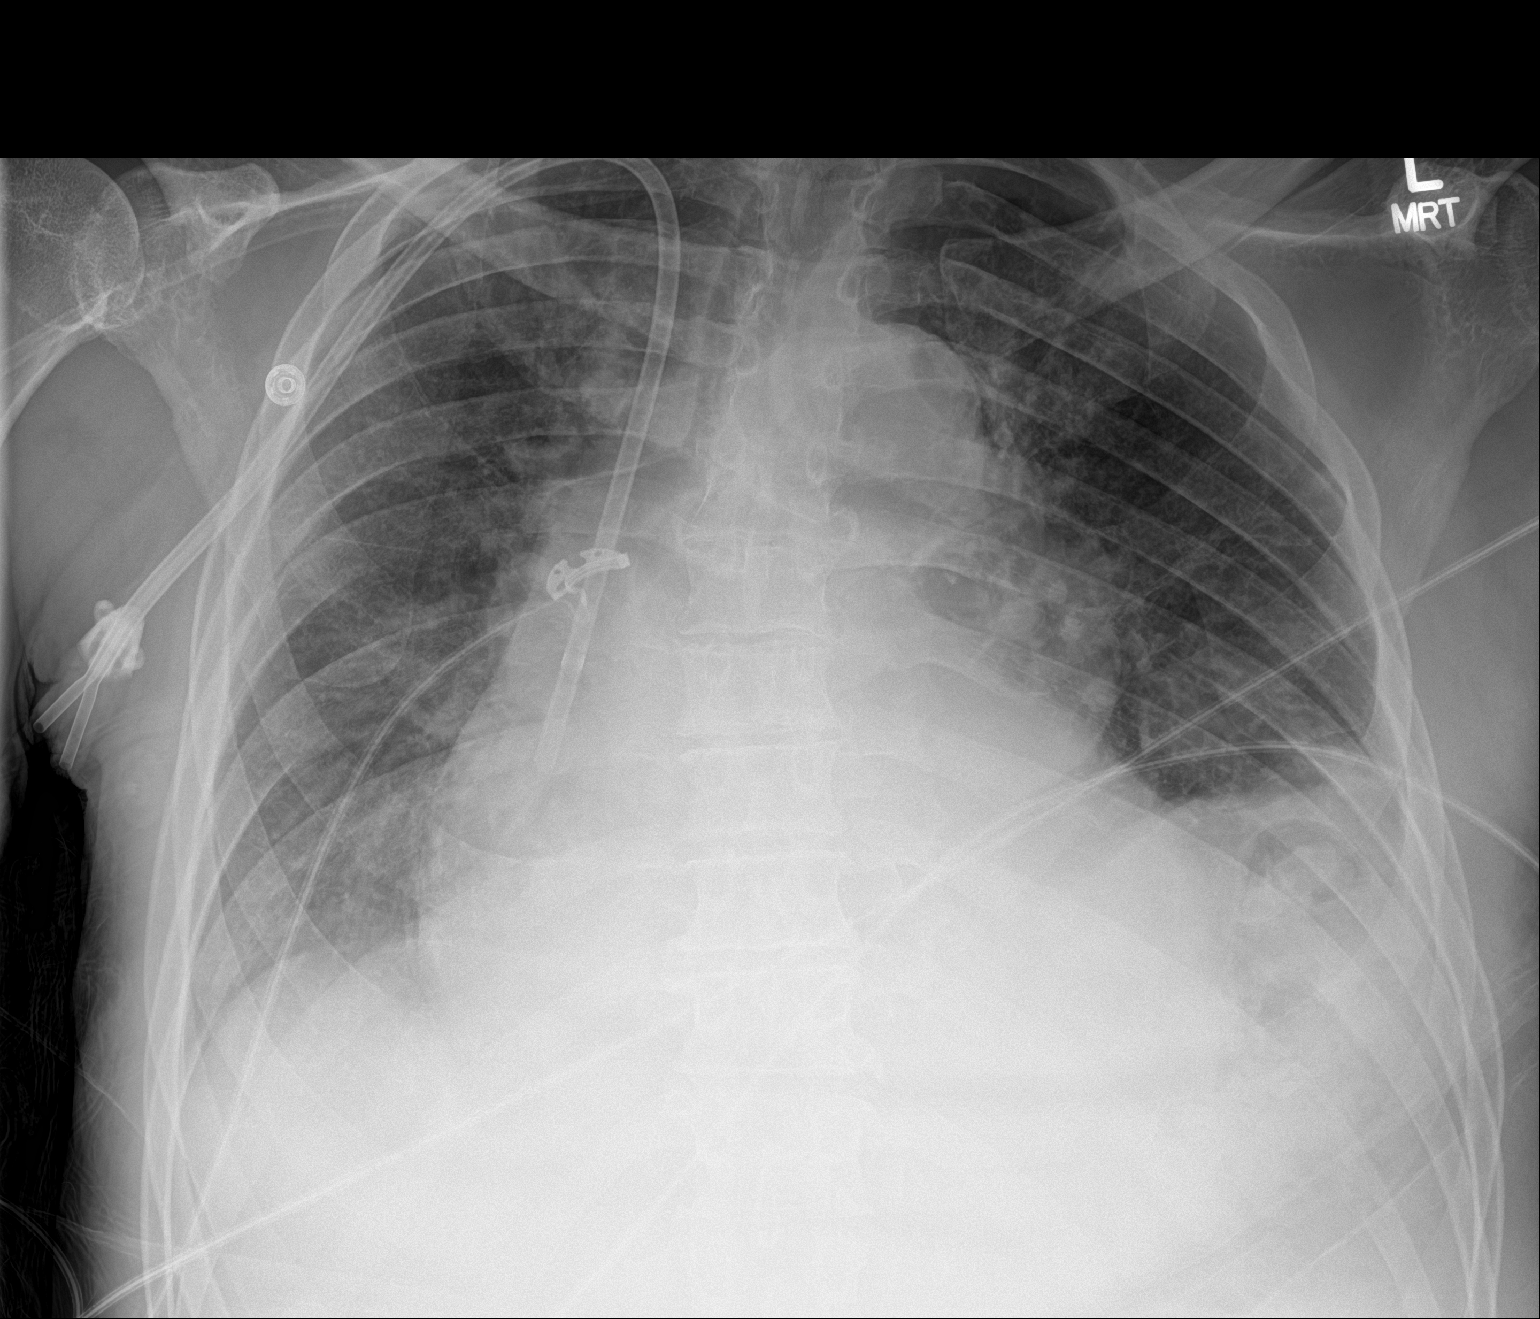

[1 of 1 positions shown; findings below may reference images not displayed]

FINDINGS: Cardiac shadow remains enlarged. New dialysis catheter is noted in
satisfactory position. The overall inspiratory effort is poor
although no pneumothorax is seen. Mild vascular congestion is again
noted. Left basilar atelectatic changes are seen.
IMPRESSION: No pneumothorax following dialysis catheter placement.

Mild left basilar atelectasis.

## 2017-01-20 SURGERY — ARTERIOVENOUS (AV) FISTULA CREATION
Anesthesia: General | Site: Arm Upper | Laterality: Right

## 2017-01-20 MED ORDER — PHENYLEPHRINE HCL 10 MG/ML IJ SOLN
INTRAVENOUS | Status: DC | PRN
  Administered 2017-01-20: 50 ug/min via INTRAVENOUS

## 2017-01-20 MED ORDER — LIDOCAINE 2% (20 MG/ML) 5 ML SYRINGE
INTRAMUSCULAR | Status: AC
  Filled 2017-01-20: qty 5

## 2017-01-20 MED ORDER — OXYCODONE HCL 5 MG PO TABS: 5.0000 mg | ORAL_TABLET | Freq: Once | ORAL | Status: DC | PRN

## 2017-01-20 MED ORDER — HEPARIN SODIUM (PORCINE) 1000 UNIT/ML IJ SOLN
INTRAMUSCULAR | Status: AC
  Filled 2017-01-20: qty 1

## 2017-01-20 MED ORDER — ONDANSETRON HCL 4 MG/2ML IJ SOLN: 4.0000 mg | Freq: Once | INTRAMUSCULAR | Status: DC | PRN

## 2017-01-20 MED ORDER — SODIUM CHLORIDE 0.9 % IV SOLN
INTRAVENOUS | Status: DC | PRN
  Administered 2017-01-20: 13:00:00

## 2017-01-20 MED ORDER — DEXTROSE 5 % IV SOLN: 1.5000 g | INTRAVENOUS | Status: DC

## 2017-01-20 MED ORDER — DEXTROSE 5 % IV SOLN
INTRAVENOUS | Status: DC | PRN
  Administered 2017-01-20: 1.5 g via INTRAVENOUS

## 2017-01-20 MED ORDER — DEXAMETHASONE SODIUM PHOSPHATE 10 MG/ML IJ SOLN
INTRAMUSCULAR | Status: DC | PRN
  Administered 2017-01-20: 5 mg via INTRAVENOUS

## 2017-01-20 MED ORDER — 0.9 % SODIUM CHLORIDE (POUR BTL) OPTIME
TOPICAL | Status: DC | PRN
  Administered 2017-01-20: 1000 mL

## 2017-01-20 MED ORDER — PROPOFOL 10 MG/ML IV BOLUS
INTRAVENOUS | Status: AC
  Filled 2017-01-20: qty 20

## 2017-01-20 MED ORDER — ALBUMIN HUMAN 5 % IV SOLN
INTRAVENOUS | Status: DC | PRN
  Administered 2017-01-20: 15:00:00 via INTRAVENOUS

## 2017-01-20 MED ORDER — LIDOCAINE-EPINEPHRINE (PF) 1 %-1:200000 IJ SOLN
INTRAMUSCULAR | Status: DC | PRN
  Administered 2017-01-20: 15 mL

## 2017-01-20 MED ORDER — PROTAMINE SULFATE 10 MG/ML IV SOLN
INTRAVENOUS | Status: DC | PRN
  Administered 2017-01-20: 40 mg via INTRAVENOUS

## 2017-01-20 MED ORDER — EPHEDRINE 5 MG/ML INJ
INTRAVENOUS | Status: AC
  Filled 2017-01-20: qty 20

## 2017-01-20 MED ORDER — PAPAVERINE HCL 30 MG/ML IJ SOLN
INTRAMUSCULAR | Status: AC
  Filled 2017-01-20: qty 2

## 2017-01-20 MED ORDER — OXYCODONE HCL 5 MG/5ML PO SOLN: 5.0000 mg | Freq: Once | ORAL | Status: DC | PRN

## 2017-01-20 MED ORDER — ONDANSETRON HCL 4 MG/2ML IJ SOLN
INTRAMUSCULAR | Status: AC
  Filled 2017-01-20: qty 2

## 2017-01-20 MED ORDER — FENTANYL CITRATE (PF) 250 MCG/5ML IJ SOLN
INTRAMUSCULAR | Status: AC
  Filled 2017-01-20: qty 5

## 2017-01-20 MED ORDER — PHENYLEPHRINE 40 MCG/ML (10ML) SYRINGE FOR IV PUSH (FOR BLOOD PRESSURE SUPPORT)
PREFILLED_SYRINGE | INTRAVENOUS | Status: AC
  Filled 2017-01-20: qty 10

## 2017-01-20 MED ORDER — PROTAMINE SULFATE 10 MG/ML IV SOLN
INTRAVENOUS | Status: AC
  Filled 2017-01-20: qty 10

## 2017-01-20 MED ORDER — EPHEDRINE 5 MG/ML INJ
INTRAVENOUS | Status: AC
  Filled 2017-01-20: qty 10

## 2017-01-20 MED ORDER — PHENYLEPHRINE 40 MCG/ML (10ML) SYRINGE FOR IV PUSH (FOR BLOOD PRESSURE SUPPORT)
PREFILLED_SYRINGE | INTRAVENOUS | Status: DC | PRN
  Administered 2017-01-20 (×2): 160 ug via INTRAVENOUS
  Administered 2017-01-20: 80 ug via INTRAVENOUS

## 2017-01-20 MED ORDER — FENTANYL CITRATE (PF) 250 MCG/5ML IJ SOLN
INTRAMUSCULAR | Status: DC | PRN
  Administered 2017-01-20 (×2): 25 ug via INTRAVENOUS

## 2017-01-20 MED ORDER — DEXAMETHASONE SODIUM PHOSPHATE 10 MG/ML IJ SOLN
INTRAMUSCULAR | Status: AC
  Filled 2017-01-20: qty 1

## 2017-01-20 MED ORDER — VANCOMYCIN HCL IN DEXTROSE 1-5 GM/200ML-% IV SOLN
1000.0000 mg | Freq: Once | INTRAVENOUS | Status: AC
Start: 2017-01-20 — End: 2017-01-20
  Administered 2017-01-20: 1000 mg via INTRAVENOUS
  Filled 2017-01-20: qty 200

## 2017-01-20 MED ORDER — HEPARIN SODIUM (PORCINE) 1000 UNIT/ML IJ SOLN
INTRAMUSCULAR | Status: DC | PRN
  Administered 2017-01-20: 8000 [IU] via INTRAVENOUS

## 2017-01-20 MED ORDER — FENTANYL CITRATE (PF) 100 MCG/2ML IJ SOLN: 25.0000 ug | INTRAMUSCULAR | Status: DC | PRN

## 2017-01-20 MED ORDER — MIDAZOLAM HCL 5 MG/5ML IJ SOLN
INTRAMUSCULAR | Status: DC | PRN
  Administered 2017-01-20: 2 mg via INTRAVENOUS

## 2017-01-20 MED ORDER — PROPOFOL 10 MG/ML IV BOLUS
INTRAVENOUS | Status: DC | PRN
  Administered 2017-01-20: 180 mg via INTRAVENOUS
  Administered 2017-01-20: 120 mg via INTRAVENOUS

## 2017-01-20 MED ORDER — HEPARIN SODIUM (PORCINE) 1000 UNIT/ML IJ SOLN
INTRAMUSCULAR | Status: DC | PRN
  Administered 2017-01-20: 3.4 [IU] via INTRAVENOUS

## 2017-01-20 MED ORDER — LIDOCAINE HCL (PF) 1 % IJ SOLN
INTRAMUSCULAR | Status: AC
  Filled 2017-01-20: qty 30

## 2017-01-20 MED ORDER — MIDAZOLAM HCL 2 MG/2ML IJ SOLN
INTRAMUSCULAR | Status: AC
  Filled 2017-01-20: qty 2

## 2017-01-20 MED ORDER — EPHEDRINE SULFATE-NACL 50-0.9 MG/10ML-% IV SOSY
PREFILLED_SYRINGE | INTRAVENOUS | Status: DC | PRN
  Administered 2017-01-20: 20 mg via INTRAVENOUS
  Administered 2017-01-20: 10 mg via INTRAVENOUS
  Administered 2017-01-20: 20 mg via INTRAVENOUS
  Administered 2017-01-20: 15 mg via INTRAVENOUS
  Administered 2017-01-20: 5 mg via INTRAVENOUS

## 2017-01-20 MED ORDER — ONDANSETRON HCL 4 MG/2ML IJ SOLN
INTRAMUSCULAR | Status: DC | PRN
  Administered 2017-01-20: 4 mg via INTRAVENOUS

## 2017-01-20 MED ORDER — SODIUM CHLORIDE 0.9 % IV SOLN
INTRAVENOUS | Status: DC
  Administered 2017-01-20: 13:00:00 via INTRAVENOUS

## 2017-01-20 MED ORDER — LIDOCAINE 2% (20 MG/ML) 5 ML SYRINGE
INTRAMUSCULAR | Status: DC | PRN
  Administered 2017-01-20 (×2): 40 mg via INTRAVENOUS

## 2017-01-20 MED ORDER — DEXTROSE 5 % IV SOLN
INTRAVENOUS | Status: AC
  Filled 2017-01-20: qty 1.5

## 2017-01-20 MED ORDER — LIDOCAINE-EPINEPHRINE (PF) 1 %-1:200000 IJ SOLN
INTRAMUSCULAR | Status: AC
  Filled 2017-01-20: qty 30

## 2017-01-20 SURGICAL SUPPLY — 69 items
ADH SKN CLS APL DERMABOND .7 (GAUZE/BANDAGES/DRESSINGS) ×2
ADH SKN CLS LQ APL DERMABOND (GAUZE/BANDAGES/DRESSINGS) ×4
ARMBAND PINK RESTRICT EXTREMIT (MISCELLANEOUS) ×8 IMPLANT
BAG DECANTER FOR FLEXI CONT (MISCELLANEOUS) ×4 IMPLANT
BIOPATCH RED 1 DISK 7.0 (GAUZE/BANDAGES/DRESSINGS) ×3 IMPLANT
BIOPATCH RED 1IN DISK 7.0MM (GAUZE/BANDAGES/DRESSINGS) ×1
CANISTER SUCT 3000ML PPV (MISCELLANEOUS) ×4 IMPLANT
CANNULA VESSEL 3MM 2 BLNT TIP (CANNULA) ×4 IMPLANT
CATH PALINDROME RT-P 15FX19CM (CATHETERS) IMPLANT
CATH PALINDROME RT-P 15FX23CM (CATHETERS) ×2 IMPLANT
CATH PALINDROME RT-P 15FX28CM (CATHETERS) IMPLANT
CATH PALINDROME RT-P 15FX55CM (CATHETERS) IMPLANT
CHLORAPREP W/TINT 26ML (MISCELLANEOUS) ×4 IMPLANT
CLIP VESOCCLUDE MED 6/CT (CLIP) ×4 IMPLANT
CLIP VESOCCLUDE SM WIDE 6/CT (CLIP) ×4 IMPLANT
COVER PROBE W GEL 5X96 (DRAPES) IMPLANT
COVER SURGICAL LIGHT HANDLE (MISCELLANEOUS) ×4 IMPLANT
DECANTER SPIKE VIAL GLASS SM (MISCELLANEOUS) ×6 IMPLANT
DERMABOND ADHESIVE PROPEN (GAUZE/BANDAGES/DRESSINGS) ×4
DERMABOND ADVANCED (GAUZE/BANDAGES/DRESSINGS) ×2
DERMABOND ADVANCED .7 DNX12 (GAUZE/BANDAGES/DRESSINGS) ×2 IMPLANT
DERMABOND ADVANCED .7 DNX6 (GAUZE/BANDAGES/DRESSINGS) IMPLANT
DRAPE C-ARM 42X72 X-RAY (DRAPES) ×4 IMPLANT
DRAPE CHEST BREAST 15X10 FENES (DRAPES) ×4 IMPLANT
DRSG COVADERM 4X6 (GAUZE/BANDAGES/DRESSINGS) ×2 IMPLANT
ELECT REM PT RETURN 9FT ADLT (ELECTROSURGICAL) ×4
ELECTRODE REM PT RTRN 9FT ADLT (ELECTROSURGICAL) ×2 IMPLANT
GAUZE SPONGE 2X2 8PLY STRL LF (GAUZE/BANDAGES/DRESSINGS) IMPLANT
GAUZE SPONGE 4X4 12PLY STRL (GAUZE/BANDAGES/DRESSINGS) ×2 IMPLANT
GAUZE SPONGE 4X4 16PLY XRAY LF (GAUZE/BANDAGES/DRESSINGS) ×4 IMPLANT
GLOVE BIO SURGEON STRL SZ 6.5 (GLOVE) ×1 IMPLANT
GLOVE BIO SURGEON STRL SZ7 (GLOVE) ×2 IMPLANT
GLOVE BIO SURGEON STRL SZ7.5 (GLOVE) ×6 IMPLANT
GLOVE BIO SURGEONS STRL SZ 6.5 (GLOVE) ×1
GLOVE BIOGEL PI IND STRL 6.5 (GLOVE) IMPLANT
GLOVE BIOGEL PI IND STRL 8 (GLOVE) ×2 IMPLANT
GLOVE BIOGEL PI IND STRL 8.5 (GLOVE) IMPLANT
GLOVE BIOGEL PI INDICATOR 6.5 (GLOVE) ×4
GLOVE BIOGEL PI INDICATOR 8 (GLOVE) ×2
GLOVE BIOGEL PI INDICATOR 8.5 (GLOVE) ×2
GOWN STRL REUS W/ TWL LRG LVL3 (GOWN DISPOSABLE) ×6 IMPLANT
GOWN STRL REUS W/TWL LRG LVL3 (GOWN DISPOSABLE) ×12
KIT BASIN OR (CUSTOM PROCEDURE TRAY) ×4 IMPLANT
KIT ROOM TURNOVER OR (KITS) ×4 IMPLANT
NDL 18GX1X1/2 (RX/OR ONLY) (NEEDLE) ×2 IMPLANT
NDL HYPO 25GX1X1/2 BEV (NEEDLE) ×2 IMPLANT
NEEDLE 18GX1X1/2 (RX/OR ONLY) (NEEDLE) ×8 IMPLANT
NEEDLE HYPO 25GX1X1/2 BEV (NEEDLE) ×4 IMPLANT
NS IRRIG 1000ML POUR BTL (IV SOLUTION) ×4 IMPLANT
PACK CV ACCESS (CUSTOM PROCEDURE TRAY) ×4 IMPLANT
PACK SURGICAL SETUP 50X90 (CUSTOM PROCEDURE TRAY) ×4 IMPLANT
PAD ARMBOARD 7.5X6 YLW CONV (MISCELLANEOUS) ×8 IMPLANT
SPONGE GAUZE 2X2 STER 10/PKG (GAUZE/BANDAGES/DRESSINGS) ×2
SPONGE SURGIFOAM ABS GEL 100 (HEMOSTASIS) IMPLANT
SUT ETHILON 3 0 PS 1 (SUTURE) ×4 IMPLANT
SUT PROLENE 6 0 BV (SUTURE) ×4 IMPLANT
SUT VIC AB 3-0 SH 27 (SUTURE) ×8
SUT VIC AB 3-0 SH 27X BRD (SUTURE) ×2 IMPLANT
SUT VIC AB 4-0 PS2 27 (SUTURE) ×4 IMPLANT
SUT VICRYL 4-0 PS2 18IN ABS (SUTURE) ×4 IMPLANT
SYR 10ML LL (SYRINGE) ×6 IMPLANT
SYR 20CC LL (SYRINGE) ×8 IMPLANT
SYR 5ML LL (SYRINGE) ×8 IMPLANT
SYR CONTROL 10ML LL (SYRINGE) ×4 IMPLANT
SYRINGE 20CC LL (MISCELLANEOUS) ×2 IMPLANT
TOWEL GREEN STERILE (TOWEL DISPOSABLE) ×8 IMPLANT
TOWEL GREEN STERILE FF (TOWEL DISPOSABLE) ×4 IMPLANT
UNDERPAD 30X30 (UNDERPADS AND DIAPERS) ×4 IMPLANT
WATER STERILE IRR 1000ML POUR (IV SOLUTION) ×4 IMPLANT

## 2017-01-20 NOTE — Telephone Encounter (Signed)
Sched appt 03/02/17; lab at 3:00 and MD at 3:45. Lm on hm#.

## 2017-01-20 NOTE — Progress Notes (Signed)
   Mr. Beveridge has agreed to proceed with surgery for AV fistula verses graft and TDC placement. NPO now  Surgery scheduled for today with Dr. Scot Dock.   Roxy Horseman PA-C

## 2017-01-20 NOTE — Anesthesia Procedure Notes (Signed)
Procedure Name: LMA Insertion Date/Time: 01/20/2017 1:35 PM Performed by: Freddie Breech, CRNA Pre-anesthesia Checklist: Patient identified, Emergency Drugs available, Suction available and Patient being monitored Patient Re-evaluated:Patient Re-evaluated prior to induction Oxygen Delivery Method: Circle System Utilized Preoxygenation: Pre-oxygenation with 100% oxygen Induction Type: IV induction Ventilation: Mask ventilation without difficulty LMA: LMA inserted LMA Size: 5.0 Number of attempts: 1 Airway Equipment and Method: Bite block Placement Confirmation: positive ETCO2 Tube secured with: Tape Dental Injury: Teeth and Oropharynx as per pre-operative assessment

## 2017-01-20 NOTE — Progress Notes (Signed)
[facility]                                                                              Patient Demographics  Jason [name], is a 57 y.o. male, DOB - January 04, 1961, PJS:[id number]  Admit date - 01/16/2017   Admitting Physician A Melven Sartorius., MD  Outpatient Primary MD for the patient is Wandra Mannan, NP  Outpatient specialists:   LOS - 4  days   Medical records reviewed and are as summarized below:    Chief Complaint  Patient presents with  . Leg Swelling       Brief summary   Jason [name] a 57 y.o.malewith medical history significant ofhypertension, chronic kidney disease, anemia, inguinal hernia presenting with worsening shortness of breath, lower extremity edema, and altered mental status.in the ED, hypertensive emergency with worsening renal function, heart failure, acute encephalopathy and bilateral chronic venous stasis with stasis dermatitis.   Seen and examined at dialysis unit. Denies chest pain, dyspnea or palpitations. Persistent LE edema bilaterally.    Assessment & Plan    Principal Problem:   Hypertensive emergency, improving on HD - BP 224/171 at the time of admission  - BP currently improved, continue amlodipine, Coreg, hydralazine - will continue to improve on hemodialysis  - continue dialysis to improve BP and fluid status - on clonidine po at home  Active Problems:   Acute kidney injury ([place]) on chronic kidney disease, stage IV, progressed to new ESRD -Nephrology consulted, following for hemodialysis.  Lasix discontinued. -Vascular surgery consulted for permanent access -IR consulted for [facility]  Acute hypoxic respiratory failure secondary to acute on chronic HFREF 35-40%, hypervolemia due to ESRD -2D echo showed EF of 35-40% with moderate diffuse hypokinesis, grade 2 diastolic dysfunction -Cardiology has been consulted and following -Hemodialysis will help with peripheral edema and CHF exacerbation   Elevated  troponin -Troponin peaked at 0.43 and trended down suspect due to ESRD and demand ischemia -2D echo with EF of 35-40%  (01/17/17) with moderate diffuse hypokinesis  -Cardiology following -Repeat echo in 3 months, if LVEF not improved cardiology will assess for ischemia  Bilateral chronic venous stasis with stasis dermatitis with suspected super imposed cellulitis -CT with severe soft tissue edema -Wound care following, dressings addressed -Continue IV vancomycin day # 5. Managed by pharmacy -CBC, BMET pending, blood cultures negative in 2 days  Left knee pain -Rule out acute gout, placed on pain control -ESR normal -CRP, uric acid elevated in the setting of ESRD  Medical non compliance -Non compliant with medical management -Psychiatry consulted to determine capacity. -Patient has capacity to refuse treatments  Code Status: Full  DVT Prophylaxis: Heparin sq TID Family Communication: No family members at bedside Disposition Plan: will stay another midnight for IV antibiotics, to continue hemodialysis and to continue blood pressure control.  Time Spent in minutes  35 minutes  Procedures:  2D echo: Systolic function was moderately reduced.The estimated ejection fraction was in the range of 35% to 40%.Moderate diffuse hypokinesis. Features are consistent with a pseudonormal left ventricular filling pattern, with concomitant abnormal relaxation and increased filling pressure (grade 2 diastolic dysfunction)  ABI: Normal  Consultants:   Nephrology Vascular surgery Interventional radiology  Antimicrobials:   IV vancomycin day# 4   Medications  Scheduled Meds: . amLODipine  10 mg Oral Daily  . calcium acetate  667 mg Oral TID WC  . carvedilol  25 mg Oral BID WC  . feeding supplement (PRO-STAT SUGAR FREE 64)  30 mL Oral BID  . hydrALAZINE  100 mg Oral Q8H  . isosorbide mononitrate  60 mg Oral Daily   Continuous Infusions:  PRN Meds:.acetaminophen, heparin,  HYDROmorphone (DILAUDID) injection, labetalol, oxyCODONE-acetaminophen   Antibiotics   Anti-infectives (From admission, onward)   Start     Dose/Rate Route Frequency Ordered Stop   01/19/17 1600  vancomycin (VANCOCIN) IVPB 1000 mg/200 mL premix     1,000 mg 200 mL/hr over 60 Minutes Intravenous To Hemodialysis 01/19/17 1520 01/19/17 1825   01/16/17 1800  vancomycin (VANCOCIN) 2,000 mg in sodium chloride 0.9 % 500 mL IVPB     2,000 mg 250 mL/hr over 120 Minutes Intravenous  Once 01/16/17 1734 01/16/17 2344   01/16/17 1730  vancomycin (VANCOCIN) IVPB 1000 mg/200 mL premix  Status:  Discontinued     1,000 mg 200 mL/hr over 60 Minutes Intravenous  Once 01/16/17 1720 01/16/17 1734       Objective:   Vitals:   01/19/17 2329 01/20/17 0300 01/20/17 0419 01/20/17 0517  BP: (!) 130/91 (!) 144/78  (!) 168/117  Pulse:      Resp: (!) 23 (!) 27    Temp: 99.4 F (37.4 C)  (!) 100.8 F (38.2 C)   TempSrc: Oral  Oral   SpO2: 98% 96%    Weight:      Height:        Intake/Output Summary (Last 24 hours) at 01/20/2017 2376 Last data filed at 01/20/2017 2831 Gross per 24 hour  Intake 1640 ml  Output 3600 ml  Net -1960 ml     Wt Readings from Last 3 Encounters:  01/18/17 107.2 kg (236 lb 5.3 oz)  04/04/16 93 kg (205 lb)  02/26/15 95.3 kg (210 lb)     Exam  General: Alert and oriented x 3, NAD  Eyes: pupils are round and reactive to light  HEENT:  Mucous membranes are dry no erythema or exudates  Cardiovascular: S1 S2 clear, RRR   Respiratory: Bibasilar crackles  Gastrointestinal: Soft, nontender, nondistended, + bowel sounds  Ext: bilateral lower extremity edema up to tights with drainage to bilateral lower extremity  Neuro: no new deficits  Musculoskeletal: No digital cyanosis, clubbing. 3+LE edema with oozing bilaterally.  Skin: Erythema, drainage on b/l LE  Psych: Normal affect and demeanor, alert and oriented x3    Data Reviewed:  I have personally reviewed  following labs and imaging studies  Micro Results Recent Results (from the past 240 hour(s))  Culture, blood (routine x 2)     Status: None (Preliminary result)   Collection Time: 01/16/17  5:11 PM  Result Value Ref Range Status   Specimen Description RIGHT ANTECUBITAL  Final   Special Requests   Final    BOTTLES DRAWN AEROBIC AND ANAEROBIC Blood Culture adequate volume   Culture   Final    NO GROWTH 2 DAYS Performed at [facility], [address], [place], [place] [address]    Report Status PENDING  Incomplete  Culture, blood (routine x 2)     Status: None (Preliminary result)   Collection Time: 01/16/17  7:44 PM  Result Value Ref Range Status  Specimen Description BLOOD RIGHT ANTECUBITAL  Final   Special Requests   Final    BOTTLES DRAWN AEROBIC AND ANAEROBIC Blood Culture adequate volume   Culture   Final    NO GROWTH 2 DAYS Performed at [facility], [address], [place], [place] [address]    Report Status PENDING  Incomplete  MRSA PCR Screening     Status: None   Collection Time: 01/17/17  9:31 PM  Result Value Ref Range Status   MRSA by PCR NEGATIVE NEGATIVE Final    Comment:        The GeneXpert MRSA Assay (FDA approved for NASAL specimens only), is one component of a comprehensive MRSA colonization surveillance program. It is not intended to diagnose MRSA infection nor to guide or monitor treatment for MRSA infections.     Radiology Reports Dg Chest 2 View  Result Date: 01/16/2017 CLINICAL DATA:  Shortness of breath, BILATERAL lower extremity edema with weeping wounds, on oxygen, history hypertension, former smoker EXAM: CHEST  2 VIEW COMPARISON:  Non FINDINGS: Enlargement of cardiac silhouette with pulmonary vascular congestion. Rotated to the RIGHT. Subsegmental atelectasis LEFT mid lung and at both bases greater on LEFT. Minimal LEFT pleural effusion. No definite acute infiltrate, pleural effusion or pneumothorax. IMPRESSION: Enlargement of  cardiac silhouette with vascular congestion. Scattered atelectasis greater on LEFT with minimal LEFT pleural effusion. Electronically Signed   By: Lavonia Dana M.D.   On: 01/16/2017 13:09   Ct Head Wo Contrast  Result Date: 01/16/2017 CLINICAL DATA:  Headache hypertension EXAM: CT HEAD WITHOUT CONTRAST TECHNIQUE: Contiguous axial images were obtained from the base of the skull through the vertex without intravenous contrast. COMPARISON:  None. FINDINGS: Motion degraded images limit assessment. Brain: No evidence of acute large vascular territory infarction, hemorrhage, hydrocephalus, extra-axial collection or mass lesion/mass effect. No intraventricular hemorrhage. No effacement of the basal cisterns or fourth ventricle. Hyperdensity along the falx likely related to falcine calcifications, without eccentric or focal thickening to suggest hemorrhage. Minimal periventricular white matter hypodensities consistent with small vessel ischemic disease. Vascular: No hyperdense vessel or unexpected calcification. Skull: Negative for fracture or focal lesion. Sinuses/Orbits: No acute finding. Other: None IMPRESSION: Limited by motion artifacts. Minimal small vessel ischemic disease. No acute intracranial abnormality. Electronically Signed   By: Ashley Royalty M.D.   On: 01/16/2017 18:09   US Renal  Result Date: 01/16/2017 CLINICAL DATA:  Acute renal failure.  History of hypertension. EXAM: RENAL / URINARY TRACT ULTRASOUND COMPLETE COMPARISON:  01/29/2015 FINDINGS: Right Kidney: Length: 9.9 cm. Echogenic renal parenchyma. No focal mass or hydronephrosis. Left Kidney: Length: 9.2 cm. Echogenic renal parenchyma. No focal mass or hydronephrosis. Bladder: Small amount of debris is noted within the bladder. Additional: Bilateral pleural effusions are noted. IMPRESSION: 1. Echogenic renal parenchyma bilaterally, compatible with the history of renal failure. No hydronephrosis. 2. Bilateral pleural effusions. Electronically Signed    By: Nolon Nations M.D.   On: 01/16/2017 18:47   Ct Tibia Fibula Left Wo Contrast  Result Date: 01/16/2017 CLINICAL DATA:  Cellulitis.  Evaluate for osteomyelitis. EXAM: CT OF THE LOWER LEFT EXTREMITY WITHOUT CONTRAST TECHNIQUE: Multidetector CT imaging of the lower left extremity was performed according to the standard protocol. COMPARISON:  None. FINDINGS: Bones/Joint/Cartilage No fracture or dislocation. Normal alignment. No joint effusion. Moderate medial femorotibial compartment joint space narrowing with marginal osteophytes and subchondral sclerosis. Mild lateral femorotibial compartment joint space narrowing with small marginal osteophytes. Small patellofemoral compartment marginal osteophytes. Subchondral cystic changes in  the medial corner of the talar dome. Small anterior tibial plafond marginal osteophyte. No aggressive osseous lesion. No periosteal reaction or bone destruction. Ligaments Ligaments are suboptimally evaluated by CT. Muscles and Tendons Muscles are normal.  No muscle atrophy. Soft tissue Severe soft tissue edema within the subcutaneous fat of the left lower leg. Severe soft tissue edema within the subcutaneous fat of the visualized portions of the right lower leg. No soft tissue mass. IMPRESSION: 1. Severe soft tissue edema within the subcutaneous fat of the left lower leg. Severe soft tissue edema within the subcutaneous fat of the visualized portions of the right lower leg. Differential considerations include severe cellulitis versus venous stasis. Electronically Signed   By: Kathreen Devoid   On: 01/16/2017 17:32   Ir Fluoro Guide Cv Line Right  Result Date: 01/18/2017 CLINICAL DATA:  Renal failure, needs venous access for hemodialysis EXAM: EXAM RIGHT IJ CATHETER PLACEMENT UNDER ULTRASOUND AND FLUOROSCOPIC GUIDANCE TECHNIQUE: The procedure, risks (including but not limited to bleeding, infection, organ damage, pneumothorax), benefits, and alternatives were explained to the  patient. Questions regarding the procedure were encouraged and answered. The patient understands and consents to the procedure. Patency of the right IJ vein was confirmed with ultrasound with image documentation. An appropriate skin site was determined. Skin site was marked. Region was prepped using maximum barrier technique including cap and mask, sterile gown, sterile gloves, large sterile sheet, and Chlorhexidine as cutaneous antisepsis. The region was infiltrated locally with 1% lidocaine. Under real-time ultrasound guidance, the right IJ vein was accessed with a 21 gauge needle; the needle tip within the vein was confirmed with ultrasound image documentation. The needle exchanged over a 018 guidewire for vascular dilator which allowed advancement of a 20 cm Mahurkar catheter. This was positioned with the tip at the cavoatrial junction. Spot chest radiograph shows good positioning and no pneumothorax. Catheter was flushed and sutured externally with 0-Prolene sutures. Patient tolerated the procedure well. FLUOROSCOPY TIME:  Less than 6 seconds COMPLICATIONS: COMPLICATIONS none IMPRESSION: 1. Technically successful right IJ Mahurkar catheter placement. Electronically Signed   By: Lucrezia Europe M.D.   On: 01/18/2017 12:53   Ir US Guide Vasc Access Right  Result Date: 01/18/2017 CLINICAL DATA:  Renal failure, needs venous access for hemodialysis EXAM: EXAM RIGHT IJ CATHETER PLACEMENT UNDER ULTRASOUND AND FLUOROSCOPIC GUIDANCE TECHNIQUE: The procedure, risks (including but not limited to bleeding, infection, organ damage, pneumothorax), benefits, and alternatives were explained to the patient. Questions regarding the procedure were encouraged and answered. The patient understands and consents to the procedure. Patency of the right IJ vein was confirmed with ultrasound with image documentation. An appropriate skin site was determined. Skin site was marked. Region was prepped using maximum barrier technique including  cap and mask, sterile gown, sterile gloves, large sterile sheet, and Chlorhexidine as cutaneous antisepsis. The region was infiltrated locally with 1% lidocaine. Under real-time ultrasound guidance, the right IJ vein was accessed with a 21 gauge needle; the needle tip within the vein was confirmed with ultrasound image documentation. The needle exchanged over a 018 guidewire for vascular dilator which allowed advancement of a 20 cm Mahurkar catheter. This was positioned with the tip at the cavoatrial junction. Spot chest radiograph shows good positioning and no pneumothorax. Catheter was flushed and sutured externally with 0-Prolene sutures. Patient tolerated the procedure well. FLUOROSCOPY TIME:  Less than 6 seconds COMPLICATIONS: COMPLICATIONS none IMPRESSION: 1. Technically successful right IJ Mahurkar catheter placement. Electronically Signed   By: Eden Emms.D.  On: 01/18/2017 12:53    Lab Data:  CBC: Recent Labs  Lab 01/16/17 1249 01/16/17 1316 01/17/17 0531 01/19/17 0255 01/20/17 0214  WBC 6.0  --  17.6* 11.3* 9.5  NEUTROABS 5.1  --   --   --   --   HGB 14.1 14.3 14.0 11.8* 11.1*  HCT 39.8 42.0 40.1 35.4* 33.3*  MCV 85.4  --  85.5 84.9 84.5  PLT 370  --  362 290 006   Basic Metabolic Panel: Recent Labs  Lab 01/16/17 1249 01/16/17 1314 01/16/17 1316 01/17/17 0531 01/18/17 1650 01/19/17 0255 01/20/17 0214  NA 135  --  136 135  --  136 137  K 5.4*  --  5.3* 5.6*  --  4.3 3.8  CL 102  --  102 101  --  102 103  CO2 21*  --   --  22  --  22 25  GLUCOSE 105*  --  101* 115*  --  92 117*  BUN 118*  --  94* 118*  --  100* 83*  CREATININE 6.98*  --  7.00* 6.50*  --  5.47* 4.96*  CALCIUM 8.8*  --   --  8.3*  --  7.6* 7.2*  PHOS  --  7.9*  --   --  5.9*  --   --    GFR: Estimated Creatinine Clearance: 21.9 mL/min (A) (by C-G formula based on SCr of 4.96 mg/dL (H)). Liver Function Tests: Recent Labs  Lab 01/16/17 1314 01/17/17 0531 01/20/17 0214  AST 24 22 12*  ALT 64* 56  24  ALKPHOS 48 46 45  BILITOT 1.0 0.8 0.7  PROT 6.6 6.4* 5.1*  ALBUMIN 2.8* 2.7* 1.9*   No results for input(s): LIPASE, AMYLASE in the last 168 hours. No results for input(s): AMMONIA in the last 168 hours. Coagulation Profile: No results for input(s): INR, PROTIME in the last 168 hours. Cardiac Enzymes: Recent Labs  Lab 01/16/17 1944 01/17/17 0117 01/17/17 0531  TROPONINI 0.37* 0.30* 0.29*   BNP (last 3 results) No results for input(s): PROBNP in the last 8760 hours. HbA1C: No results for input(s): HGBA1C in the last 72 hours. CBG: Recent Labs  Lab 01/19/17 0856  GLUCAP 99   Lipid Profile: No results for input(s): CHOL, HDL, LDLCALC, TRIG, CHOLHDL, LDLDIRECT in the last 72 hours. Thyroid Function Tests: No results for input(s): TSH, T4TOTAL, FREET4, T3FREE, THYROIDAB in the last 72 hours. Anemia Panel: No results for input(s): VITAMINB12, FOLATE, FERRITIN, TIBC, IRON, RETICCTPCT in the last 72 hours. Urine analysis:    Component Value Date/Time   COLORURINE YELLOW 01/16/2017 1420   APPEARANCEUR CLEAR 01/16/2017 1420   LABSPEC 1.013 01/16/2017 1420   PHURINE 5.0 01/16/2017 1420   GLUCOSEU NEGATIVE 01/16/2017 1420   HGBUR SMALL (A) 01/16/2017 1420   BILIRUBINUR NEGATIVE 01/16/2017 1420   KETONESUR NEGATIVE 01/16/2017 1420   PROTEINUR 100 (A) 01/16/2017 1420   UROBILINOGEN 0.2 02/26/2015 1028   NITRITE NEGATIVE 01/16/2017 1420   LEUKOCYTESUR NEGATIVE 01/16/2017 1420     Kayleen Memos M.D. [facility] 01/20/2017, 7:22 AM  Pager: [phone] Between 7am to 7pm - call Pager - [phone]  After 7pm go to [email or web address] - password [email or web address]  Call night coverage person covering after 7pm

## 2017-01-20 NOTE — H&P (View-Only) (Signed)
   Jason [name] has agreed to proceed with surgery for AV fistula verses graft and TDC placement. NPO now  Surgery scheduled for today with Dr. Scot Dock.   Roxy Horseman PA-C

## 2017-01-20 NOTE — Telephone Encounter (Signed)
-----   Message from Mena Goes, RN sent at 01/20/2017  2:58 PM EST ----- Regarding: 6 weeks w/ duplex   ----- Message ----- From: Angelia Mould, MD Sent: 01/20/2017   2:48 PM To: [facility] Charge Pool Subject: charge                                          PROCEDURE:   1.  Removal of right IJ hemodialysis catheter 2.  Ultrasound-guided placement of right IJ 23 cm hemodialysis catheter 3.  New right brachiocephalic AV fistula  SURGEON: Judeth Cornfield. Scot Dock, MD, FACS  ASSIST: Arlee Muslim PA  This patient needs a follow-up visit in 6 weeks with a duplex at that time to check on the maturation of the fistula.  Thank you.

## 2017-01-20 NOTE — Anesthesia Preprocedure Evaluation (Addendum)
Anesthesia Evaluation  Patient identified by MRN, date of birth, ID band Patient awake    Reviewed: Allergy & Precautions, NPO status , Patient's Chart, lab work & pertinent test results  Airway Mallampati: II  TM Distance: >3 FB Neck ROM: Full    Dental  (+) Teeth Intact, Dental Advisory Given   Pulmonary former smoker,    breath sounds clear to auscultation       Cardiovascular hypertension,  Rhythm:Regular Rate:Normal     Neuro/Psych    GI/Hepatic   Endo/Other    Renal/GU      Musculoskeletal   Abdominal   Peds  Hematology   Anesthesia Other Findings   Reproductive/Obstetrics                             Anesthesia Physical Anesthesia Plan  ASA: III  Anesthesia Plan: General   Post-op Pain Management:    Induction: Intravenous  PONV Risk Score and Plan: Ondansetron and Dexamethasone  Airway Management Planned: LMA  Additional Equipment:   Intra-op Plan:   Post-operative Plan:   Informed Consent: I have reviewed the patients History and Physical, chart, labs and discussed the procedure including the risks, benefits and alternatives for the proposed anesthesia with the patient or authorized representative who has indicated his/her understanding and acceptance.   Dental advisory given  Plan Discussed with: CRNA and Anesthesiologist  Anesthesia Plan Comments:         Anesthesia Quick Evaluation  

## 2017-01-20 NOTE — Progress Notes (Signed)
[facility] ROUNDING NOTE   Subjective:   CKD stage 5 Presents to ER with volume overload . Started  dialysis Will need  CLIP   He is more agreeable to dialysis this morning although I have concerns about his commitment.. Certainly uremia may be playing a role in his poor understanding and decisions. We will plan daily dialysis to see if mentally he will clear. He had another dialysis session this morning and will have Dr Scot Dock place permanent access     Objective:  Vital signs in last 24 hours:  Temp:  [97.6 F (36.4 C)-100.8 F (38.2 C)] 100.8 F (38.2 C) (01/10 0419) Pulse Rate:  [67-76] 70 (01/09 1955) Resp:  [16-27] 27 (01/10 0300) BP: (115-170)/(76-117) 168/117 (01/10 0517) SpO2:  [95 %-98 %] 96 % (01/10 0300)  Weight change:  Filed Weights   01/17/17 0213 01/18/17 1600  Weight: 218 lb (98.9 kg) 236 lb 5.3 oz (107.2 kg)    Intake/Output: I/O last 3 completed shifts: In: 2600 [P.O.:2400; IV Piggyback:200] Out: 6962 [XBMWU:1324; Other:2500]   Intake/Output this shift:  No intake/output data recorded.  CVS- RRR RS- CTA ABD- BS present soft non-distended EXT- lower extremity edema wrapped legs     Basic Metabolic Panel: Recent Labs  Lab 01/16/17 1249 01/16/17 1314 01/16/17 1316 01/17/17 0531 01/18/17 1650 01/19/17 0255 01/20/17 0214  NA 135  --  136 135  --  136 137  K 5.4*  --  5.3* 5.6*  --  4.3 3.8  CL 102  --  102 101  --  102 103  CO2 21*  --   --  22  --  22 25  GLUCOSE 105*  --  101* 115*  --  92 117*  BUN 118*  --  94* 118*  --  100* 83*  CREATININE 6.98*  --  7.00* 6.50*  --  5.47* 4.96*  CALCIUM 8.8*  --   --  8.3*  --  7.6* 7.2*  PHOS  --  7.9*  --   --  5.9*  --   --     Liver Function Tests: Recent Labs  Lab 01/16/17 1314 01/17/17 0531 01/20/17 0214  AST 24 22 12*  ALT 64* 56 24  ALKPHOS 48 46 45  BILITOT 1.0 0.8 0.7  PROT 6.6 6.4* 5.1*  ALBUMIN 2.8* 2.7* 1.9*   No results for input(s): LIPASE, AMYLASE in  the last 168 hours. No results for input(s): AMMONIA in the last 168 hours.  CBC: Recent Labs  Lab 01/16/17 1249 01/16/17 1316 01/17/17 0531 01/19/17 0255 01/20/17 0214  WBC 6.0  --  17.6* 11.3* 9.5  NEUTROABS 5.1  --   --   --   --   HGB 14.1 14.3 14.0 11.8* 11.1*  HCT 39.8 42.0 40.1 35.4* 33.3*  MCV 85.4  --  85.5 84.9 84.5  PLT 370  --  362 290 272    Cardiac Enzymes: Recent Labs  Lab 01/16/17 1944 01/17/17 0117 01/17/17 0531  TROPONINI 0.37* 0.30* 0.29*    BNP: Invalid input(s): POCBNP  CBG: Recent Labs  Lab 01/19/17 4010  UVOZDG 64    Microbiology: Results for orders placed or performed during the hospital encounter of 01/16/17  Culture, blood (routine x 2)     Status: None (Preliminary result)   Collection Time: 01/16/17  5:11 PM  Result Value Ref Range Status   Specimen Description RIGHT ANTECUBITAL  Final   Special Requests   Final  BOTTLES DRAWN AEROBIC AND ANAEROBIC Blood Culture adequate volume   Culture   Final    NO GROWTH 2 DAYS Performed at [facility], [place] [address] [place], [place] [address]    Report Status PENDING  Incomplete  Culture, blood (routine x 2)     Status: None (Preliminary result)   Collection Time: 01/16/17  7:44 PM  Result Value Ref Range Status   Specimen Description BLOOD RIGHT ANTECUBITAL  Final   Special Requests   Final    BOTTLES DRAWN AEROBIC AND ANAEROBIC Blood Culture adequate volume   Culture   Final    NO GROWTH 2 DAYS Performed at [facility], [place] [address], [place], [place] [address]    Report Status PENDING  Incomplete  MRSA PCR Screening     Status: None   Collection Time: 01/17/17  9:31 PM  Result Value Ref Range Status   MRSA by PCR NEGATIVE NEGATIVE Final    Comment:        The GeneXpert MRSA Assay (FDA approved for NASAL specimens only), is one component of a comprehensive MRSA colonization surveillance program. It is not intended to diagnose MRSA infection nor to guide  or monitor treatment for MRSA infections.     Coagulation Studies: No results for input(s): LABPROT, INR in the last 72 hours.  Urinalysis: No results for input(s): COLORURINE, LABSPEC, PHURINE, GLUCOSEU, HGBUR, BILIRUBINUR, KETONESUR, PROTEINUR, UROBILINOGEN, NITRITE, LEUKOCYTESUR in the last 72 hours.  Invalid input(s): APPERANCEUR    Imaging: Ir Fluoro Guide Cv Line Right  Result Date: 01/18/2017 CLINICAL DATA:  Renal failure, needs venous access for hemodialysis EXAM: EXAM RIGHT IJ CATHETER PLACEMENT UNDER ULTRASOUND AND FLUOROSCOPIC GUIDANCE TECHNIQUE: The procedure, risks (including but not limited to bleeding, infection, organ damage, pneumothorax), benefits, and alternatives were explained to the patient. Questions regarding the procedure were encouraged and answered. The patient understands and consents to the procedure. Patency of the right IJ vein was confirmed with ultrasound with image documentation. An appropriate skin site was determined. Skin site was marked. Region was prepped using maximum barrier technique including cap and mask, sterile gown, sterile gloves, large sterile sheet, and Chlorhexidine as cutaneous antisepsis. The region was infiltrated locally with 1% lidocaine. Under real-time ultrasound guidance, the right IJ vein was accessed with a 21 gauge needle; the needle tip within the vein was confirmed with ultrasound image documentation. The needle exchanged over a 018 guidewire for vascular dilator which allowed advancement of a 20 cm Mahurkar catheter. This was positioned with the tip at the cavoatrial junction. Spot chest radiograph shows good positioning and no pneumothorax. Catheter was flushed and sutured externally with 0-Prolene sutures. Patient tolerated the procedure well. FLUOROSCOPY TIME:  Less than 6 seconds COMPLICATIONS: COMPLICATIONS none IMPRESSION: 1. Technically successful right IJ Mahurkar catheter placement. Electronically Signed   By: Lucrezia Europe M.D.    On: 01/18/2017 12:53   Ir US Guide Vasc Access Right  Result Date: 01/18/2017 CLINICAL DATA:  Renal failure, needs venous access for hemodialysis EXAM: EXAM RIGHT IJ CATHETER PLACEMENT UNDER ULTRASOUND AND FLUOROSCOPIC GUIDANCE TECHNIQUE: The procedure, risks (including but not limited to bleeding, infection, organ damage, pneumothorax), benefits, and alternatives were explained to the patient. Questions regarding the procedure were encouraged and answered. The patient understands and consents to the procedure. Patency of the right IJ vein was confirmed with ultrasound with image documentation. An appropriate skin site was determined. Skin site was marked. Region was prepped using maximum barrier technique including cap  and mask, sterile gown, sterile gloves, large sterile sheet, and Chlorhexidine as cutaneous antisepsis. The region was infiltrated locally with 1% lidocaine. Under real-time ultrasound guidance, the right IJ vein was accessed with a 21 gauge needle; the needle tip within the vein was confirmed with ultrasound image documentation. The needle exchanged over a 018 guidewire for vascular dilator which allowed advancement of a 20 cm Mahurkar catheter. This was positioned with the tip at the cavoatrial junction. Spot chest radiograph shows good positioning and no pneumothorax. Catheter was flushed and sutured externally with 0-Prolene sutures. Patient tolerated the procedure well. FLUOROSCOPY TIME:  Less than 6 seconds COMPLICATIONS: COMPLICATIONS none IMPRESSION: 1. Technically successful right IJ Mahurkar catheter placement. Electronically Signed   By: Lucrezia Europe M.D.   On: 01/18/2017 12:53     Medications:   . [START ON 01/21/2017] cefUROXime (ZINACEF)  IV     . amLODipine  10 mg Oral Daily  . calcium acetate  667 mg Oral TID WC  . carvedilol  25 mg Oral BID WC  . feeding supplement (PRO-STAT SUGAR FREE 64)  30 mL Oral BID  . hydrALAZINE  100 mg Oral Q8H  . isosorbide mononitrate  60 mg  Oral Daily   acetaminophen, heparin, HYDROmorphone (DILAUDID) injection, labetalol, oxyCODONE-acetaminophen  Assessment/ Plan:  1. CKD - lost to f/u. Has progressive CKD, now likely ESRD Started HD  Need permanent  accessplaced. Left handed, save R arm Agreeable to dialysis and to placement of access 2. Severe HTN - improved with dialysis and volume challenged and will continue oral antihypertensives for now  3. Anemia Hb 11 4. Bones Check Phos 5.9  and PTH Pending and Calcium 8.6  Start calcium acetate 667 mg 1 with meals        LOS: 4 Laylonie Marzec W @TODAY @8 :58 AM

## 2017-01-20 NOTE — Progress Notes (Signed)
Progress Note  Patient Name: Jason [name] Date of Encounter: 01/20/2017  Primary Cardiologist: Kirk Ruths, MD   Subjective   Pt denies CP or dyspnea  Inpatient Medications    Scheduled Meds: . amLODipine  10 mg Oral Daily  . calcium acetate  667 mg Oral TID WC  . carvedilol  25 mg Oral BID WC  . feeding supplement (PRO-STAT SUGAR FREE 64)  30 mL Oral BID  . hydrALAZINE  100 mg Oral Q8H  . isosorbide mononitrate  60 mg Oral Daily   Continuous Infusions:  PRN Meds: acetaminophen, heparin, HYDROmorphone (DILAUDID) injection, labetalol, oxyCODONE-acetaminophen   Vital Signs    Vitals:   01/19/17 2329 01/20/17 0300 01/20/17 0419 01/20/17 0517  BP: (!) 130/91 (!) 144/78  (!) 168/117  Pulse:      Resp: (!) 23 (!) 27    Temp: 99.4 F (37.4 C)  (!) 100.8 F (38.2 C)   TempSrc: Oral  Oral   SpO2: 98% 96%    Weight:      Height:        Intake/Output Summary (Last 24 hours) at 01/20/2017 0703 Last data filed at 01/20/2017 0524 Gross per 24 hour  Intake 1640 ml  Output 3600 ml  Net -1960 ml   Filed Weights   01/17/17 0213 01/18/17 1600  Weight: 218 lb (98.9 kg) 236 lb 5.3 oz (107.2 kg)    Telemetry    Sinus with occasional PAC and PVC- Personally Reviewed   Physical Exam   GEN: WD/WN No acute distress.   Neck: JVP 10 cm Cardiac: RRR, no murmur Respiratory: Diminished BS bases; no wheeze GI: Soft, nontender, non-distended, no masses MS: 2 + edema; wrapped, erythema noted Neuro:  grossly intact   Labs    Chemistry Recent Labs  Lab 01/16/17 1314  01/17/17 0531 01/19/17 0255 01/20/17 0214  NA  --    < > 135 136 137  K  --    < > 5.6* 4.3 3.8  CL  --    < > 101 102 103  CO2  --   --  22 22 25   GLUCOSE  --    < > 115* 92 117*  BUN  --    < > 118* 100* 83*  CREATININE  --    < > 6.50* 5.47* 4.96*  CALCIUM  --   --  8.3* 7.6* 7.2*  PROT 6.6  --  6.4*  --  5.1*  ALBUMIN 2.8*  --  2.7*  --  1.9*  AST 24  --  22  --  12*  ALT 64*  --  56  --  24    ALKPHOS 48  --  46  --  45  BILITOT 1.0  --  0.8  --  0.7  GFRNONAA  --   --  9* 11* 12*  GFRAA  --   --  10* 12* 14*  ANIONGAP  --   --  12 12 9    < > = values in this interval not displayed.     Hematology Recent Labs  Lab 01/17/17 0531 01/19/17 0255 01/20/17 0214  WBC 17.6* 11.3* 9.5  RBC 4.69 4.17* 3.94*  HGB 14.0 11.8* 11.1*  HCT 40.1 35.4* 33.3*  MCV 85.5 84.9 84.5  MCH 29.9 28.3 28.2  MCHC 34.9 33.3 33.3  RDW 13.4 13.3 13.1  PLT 362 290 272    Cardiac Enzymes Recent Labs  Lab 01/16/17 1944 01/17/17 0117 01/17/17 0531  TROPONINI 0.37* 0.30* 0.29*    Recent Labs  Lab 01/16/17 1314  TROPIPOC 0.43*     BNP Recent Labs  Lab 01/16/17 1249  BNP >4,500.0*       Radiology    Ir Fluoro Guide Cv Line Right  Result Date: 01/18/2017 CLINICAL DATA:  Renal failure, needs venous access for hemodialysis EXAM: EXAM RIGHT IJ CATHETER PLACEMENT UNDER ULTRASOUND AND FLUOROSCOPIC GUIDANCE TECHNIQUE: The procedure, risks (including but not limited to bleeding, infection, organ damage, pneumothorax), benefits, and alternatives were explained to the patient. Questions regarding the procedure were encouraged and answered. The patient understands and consents to the procedure. Patency of the right IJ vein was confirmed with ultrasound with image documentation. An appropriate skin site was determined. Skin site was marked. Region was prepped using maximum barrier technique including cap and mask, sterile gown, sterile gloves, large sterile sheet, and Chlorhexidine as cutaneous antisepsis. The region was infiltrated locally with 1% lidocaine. Under real-time ultrasound guidance, the right IJ vein was accessed with a 21 gauge needle; the needle tip within the vein was confirmed with ultrasound image documentation. The needle exchanged over a 018 guidewire for vascular dilator which allowed advancement of a 20 cm Mahurkar catheter. This was positioned with the tip at the cavoatrial  junction. Spot chest radiograph shows good positioning and no pneumothorax. Catheter was flushed and sutured externally with 0-Prolene sutures. Patient tolerated the procedure well. FLUOROSCOPY TIME:  Less than 6 seconds COMPLICATIONS: COMPLICATIONS none IMPRESSION: 1. Technically successful right IJ Mahurkar catheter placement. Electronically Signed   By: Lucrezia Europe M.D.   On: 01/18/2017 12:53   Ir US Guide Vasc Access Right  Result Date: 01/18/2017 CLINICAL DATA:  Renal failure, needs venous access for hemodialysis EXAM: EXAM RIGHT IJ CATHETER PLACEMENT UNDER ULTRASOUND AND FLUOROSCOPIC GUIDANCE TECHNIQUE: The procedure, risks (including but not limited to bleeding, infection, organ damage, pneumothorax), benefits, and alternatives were explained to the patient. Questions regarding the procedure were encouraged and answered. The patient understands and consents to the procedure. Patency of the right IJ vein was confirmed with ultrasound with image documentation. An appropriate skin site was determined. Skin site was marked. Region was prepped using maximum barrier technique including cap and mask, sterile gown, sterile gloves, large sterile sheet, and Chlorhexidine as cutaneous antisepsis. The region was infiltrated locally with 1% lidocaine. Under real-time ultrasound guidance, the right IJ vein was accessed with a 21 gauge needle; the needle tip within the vein was confirmed with ultrasound image documentation. The needle exchanged over a 018 guidewire for vascular dilator which allowed advancement of a 20 cm Mahurkar catheter. This was positioned with the tip at the cavoatrial junction. Spot chest radiograph shows good positioning and no pneumothorax. Catheter was flushed and sutured externally with 0-Prolene sutures. Patient tolerated the procedure well. FLUOROSCOPY TIME:  Less than 6 seconds COMPLICATIONS: COMPLICATIONS none IMPRESSION: 1. Technically successful right IJ Mahurkar catheter placement.  Electronically Signed   By: Lucrezia Europe M.D.   On: 01/18/2017 12:53    Patient Profile     57 year old male with past medical history of hypertension, medical noncompliance and chronic kidney disease for evaluation of cardiomyopathy and elevated troponin.  Was admitted on January 6 with complaints of dyspnea on exertion, orthopnea, weight gain and lower extremity edema.  Patient's initial blood pressure was 224/178. Echocardiogram was personally reviewed.  Ejection fraction appears to be 15-20%.  There is moderate mitral and tricuspid regurgitation. Troponin I 0.43, 0.37, 0.30 and 0.29.  Assessment & Plan    1 cardiomyopathy-likely hypertensive mediated.  Not compliant with his blood pressure medications at time of admission.  Continue carvedilol, amlodipine and hydralazine/nitrates.  I have not added an ACE inhibitor given his renal insufficiency. Continue medications for 3 months and then repeat echocardiogram assuming his blood pressure has been controlled and he has been compliant with medications.  His LV function hopefully will improve once blood pressure is controlled.  If not would need ischemia evaluation at that time.  2 elevated troponin-no clear trend; no plans for ischemia eval at this point unless LV function does not improve.  3 hypertensive emergency-blood pressure remains mildly elevated.  Continue carvedilol, amlodipine, hydralazine/nitrates.  As volume is removed with dialysis, expect BP to improve.   4 acute systolic/diastolic congestive heart failure-patient remains volume overloaded likely from combination of congestive heart failure and end-stage renal disease.  Dialysis initiated for volume removal.  5 end-stage renal disease-Nephrology managing. Pt states he would be agreeable to AV fistula.  6 lower extremity cellulitis-Antibiotics per IM.    For questions or updates, please contact [facility] Please consult [email or web address] for contact info under  Cardiology/STEMI.      Signed, Kirk Ruths, MD  01/20/2017, 7:03 AM

## 2017-01-20 NOTE — Progress Notes (Signed)
Pharmacy Antibiotic Note  Jason [name] is a 57 y.o. male on day # 5 Vancomycin for cellulitis.  Vancomycin 2 gm IV x 1 given on 1/6 ~8pm.  New to hemodialysis this admit.    - First HD session on 1/8 (2.5 hrs, BFR 250 ml/min). Estimated post-HD vanc level was adequate on 1/8 and no additional Vanc given.   -  2nd session on 1/9 (2.5 hrs, BFR 250 ml/min). Estimated vanc level post-HD ~15 mcg/ml. Vanc 1gm IV given.   Random Vanc level this am = 21 mcg/ml.  HD x 3 hrs at BFR 400 ml/min done this morning, then to OR for AV fistula.  Estimate level ~15 mcg/ml post-HD this am.  Plan:  Vancomycin 1 gm IV x 1 when back on the floor post-op tonight  Will follow up for further HD plans and re-dose Vancomycin post-HD.  No standing Vanc orders.  Height: 6\' 2"  (188 cm) Weight: 219 lb 12.8 oz (99.7 kg) IBW/kg (Calculated) : 82.2  Temp (24hrs), Avg:98.9 F (37.2 C), Min:97.4 F (36.3 C), Max:100.8 F (38.2 C)  Recent Labs  Lab 01/16/17 1249 01/16/17 1316 01/17/17 0531 01/19/17 0255 01/20/17 0214  WBC 6.0  --  17.6* 11.3* 9.5  CREATININE 6.98* 7.00* 6.50* 5.47* 4.96*  VANCORANDOM  --   --   --   --  21     Allergies  Allergen Reactions  . Lisinopril Cough    Antimicrobials this admission:  Vancomycin 1/6>>  Dose adjustments this admission:  n/a  Microbiology results: 1/6 blood x 2 - ng x 3 days to date 1/7 MRSA PCR: neg  Thank you for allowing pharmacy to be a part of this patient's care.  Arty Baumgartner, [place] Pager: [phone], or [phone] 01/20/2017 4:54 PM

## 2017-01-20 NOTE — Op Note (Addendum)
    NAME: Jason [name]    MRN: [id number] DOB: 1960/02/06    DATE OF OPERATION: 01/20/2017  PREOP DIAGNOSIS:    Stage V chronic kidney disease  POSTOP DIAGNOSIS:    Same  PROCEDURE:    1.  Removal of right IJ hemodialysis catheter 2.  Ultrasound-guided placement of right IJ 23 cm hemodialysis catheter 3.  New right brachiocephalic AV fistula  SURGEON: Judeth Cornfield. Scot Dock, MD, FACS  ASSIST: Arlee Muslim PA  ANESTHESIA: General minimal  EBL: Minimal  INDICATIONS:    Jason [name] is a 57 y.o. male who presents for new access.  FINDINGS:   The temporary catheter in the right IJ was too high in the neck and therefore I did not think I could simply change this over a wire.  Therefore I cut the suture remove the catheter and held pressure for hemostasis.  I then placed a new catheter.  The upper arm cephalic vein was 4 mm.  TECHNIQUE:   The patient was taken to the operating room and received a general anesthetic.  The catheter in the right neck was too high up so I decided to remove this and not change the catheter over a wire.  The suture was cut and the catheter removed without difficulty.  Next the neck was prepped and draped in the usual sterile fashion.  Under ultrasound guidance, the right IJ was cannulated and a guidewire introduced into the superior vena cava under fluoroscopic control.  The tract over the wire was dilated.  The dilator and peel-away sheath were advanced over the wire and the wire and dilator removed.  The 23 cm catheter was advanced through the peel-away sheath and positioned in the right atrium.  The exit site for the catheter was selected and the catheter brought through the tunnel.  It was cut to the appropriate length and the distal ports were attached.  Both ports withdrew easily with and flushed with heparinized saline filled with concentrated heparin.  The catheter was secured at its exit site with a 3-0 nylon suture.  The IJ cannulation site was closed  with a 4-0 subcuticular stitch.  Next attention was turned to the right arm.  The forearm cephalic vein had multiple branches and one area that I was concerned was sclerotic.  Therefore elected to place an upper arm brachiocephalic fistula.  The right arm was prepped and draped in the usual sterile fashion.  A transverse incision was made just above the antecubital level.  Here the cephalic vein was dissected free and ligated distally.  It irrigated up nicely with heparinized saline was a 4 mm vein.  The brachial artery was dissected free beneath the fascia.  Patient was heparinized.  The brachial artery was clamped proximally and distally and a longitudinal arteriotomy was made.  The vein was sewn end to side to the artery using continuous 6-0 Prolene suture.  At the completion was an excellent thrill in the fistula and a palpable radial pulse.  The heparin was partially reversed with protamine.  The wound was closed with a deep layer of 3-0 Vicryl the skin closed with 4-0 Vicryl.  Dermabond was applied.  The patient tolerated the procedure well was transferred to the recovery room in stable condition.  All needle and sponge counts were correct.  The  Deitra Mayo, MD, FACS [facility]  DATE OF DICTATION:   01/20/2017

## 2017-01-20 NOTE — Progress Notes (Signed)
Pt is easily arousable, knows his name, that he is in House. Denies pain, VSS. Uses IS up to 1000-1100cc. Dr Linna Caprice here and aware of LOC. OK to tx pt back to [facility].

## 2017-01-20 NOTE — Anesthesia Postprocedure Evaluation (Signed)
Anesthesia Post Note  Patient: Jason [name]  Procedure(s) Performed: ARTERIOVENOUS (AV) FISTULA CREATION (Right Arm Upper) INSERTION OF DIALYSIS CATHETER (Right )     Patient location during evaluation: PACU Anesthesia Type: General Level of consciousness: awake and alert Pain management: pain level controlled Vital Signs Assessment: post-procedure vital signs reviewed and stable Respiratory status: spontaneous breathing, nonlabored ventilation, respiratory function stable and patient connected to nasal cannula oxygen Cardiovascular status: blood pressure returned to baseline and stable Postop Assessment: no apparent nausea or vomiting Anesthetic complications: no    Last Vitals:  Vitals:   01/20/17 2028 01/20/17 2100  BP: 131/90 (!) 136/91  Pulse: 75 73  Resp: 19 17  Temp:    SpO2: 100% 100%    Last Pain:  Vitals:   01/20/17 1917  TempSrc: Oral  PainSc:                  Illianna Paschal COKER

## 2017-01-20 NOTE — Interval H&P Note (Signed)
History and Physical Interval Note:  01/20/2017 12:33 PM  Jason [name]  has presented today for surgery, with the diagnosis of End stage renal disease  The various methods of treatment have been discussed with the patient and family. After consideration of risks, benefits and other options for treatment, the patient has consented to  Procedure(s): ARTERIOVENOUS (AV) FISTULA CREATION (Right) INSERTION OF DIALYSIS CATHETER (Right) as a surgical intervention .  The patient's history has been reviewed, patient examined, no change in status, stable for surgery.  I have reviewed the patient's chart and labs.  Questions were answered to the patient's satisfaction.     Deitra Mayo

## 2017-01-20 NOTE — Transfer of Care (Signed)
Immediate Anesthesia Transfer of Care Note  Patient: Jason [name]  Procedure(s) Performed: ARTERIOVENOUS (AV) FISTULA CREATION (Right Arm Upper) INSERTION OF DIALYSIS CATHETER (Right )  Patient Location: PACU  Anesthesia Type:General  Level of Consciousness: awake and patient cooperative  Airway & Oxygen Therapy: Patient Spontanous Breathing and Patient connected to face mask oxygen  Post-op Assessment: Report given to RN and Post -op Vital signs reviewed and stable  Post vital signs: Reviewed and stable  Last Vitals:  Vitals:   01/20/17 1130 01/20/17 1151  BP: (!) 151/101 (!) 151/101  Pulse: 72 72  Resp: 17 (!) 22  Temp:  36.8 C  SpO2: 100% 100%    Last Pain:  Vitals:   01/20/17 1151  TempSrc: Oral  PainSc:       Patients Stated Pain Goal: 0 (62/03/55 9741)  Complications: No apparent anesthesia complications

## 2017-01-20 NOTE — Progress Notes (Addendum)
Palliative Medicine RN Note: Consult order noted for help due to patient refusing HD and access placement. However, since order was created, patient is now agreeing to both. Paged Dr Nevada Crane for clarification & to see if we are still needed.  Due to high referral volume, there will be a delay seeing him; we will assign a PMT provider as soon as possible.  Marjie Skiff Cherylann Ratel, RN, BSN, [facility] Palliative Medicine Team 01/20/2017 8:44 AM Office [phone]  [phone] Update: Dr Nevada Crane reports PMT is not needed and she will cancel the order.

## 2017-01-21 ENCOUNTER — Encounter (HOSPITAL_COMMUNITY): Payer: Self-pay | Admitting: Vascular Surgery

## 2017-01-21 LAB — CBC
HCT: 35.3 % — ABNORMAL LOW (ref 39.0–52.0)
Hemoglobin: 11.4 g/dL — ABNORMAL LOW (ref 13.0–17.0)
MCH: 28.1 pg (ref 26.0–34.0)
MCHC: 32.3 g/dL (ref 30.0–36.0)
MCV: 86.9 fL (ref 78.0–100.0)
Platelets: 254 10*3/uL (ref 150–400)
RBC: 4.06 MIL/uL — ABNORMAL LOW (ref 4.22–5.81)
RDW: 13.4 % (ref 11.5–15.5)
WBC: 12.7 10*3/uL — AB (ref 4.0–10.5)

## 2017-01-21 LAB — BASIC METABOLIC PANEL
Anion gap: 9 (ref 5–15)
BUN: 81 mg/dL — ABNORMAL HIGH (ref 6–20)
CHLORIDE: 100 mmol/L — AB (ref 101–111)
CO2: 27 mmol/L (ref 22–32)
Calcium: 7.3 mg/dL — ABNORMAL LOW (ref 8.9–10.3)
Creatinine, Ser: 5.12 mg/dL — ABNORMAL HIGH (ref 0.61–1.24)
GFR calc Af Amer: 13 mL/min — ABNORMAL LOW (ref >60)
GFR calc non Af Amer: 12 mL/min — ABNORMAL LOW (ref >60)
Glucose, Bld: 131 mg/dL — ABNORMAL HIGH (ref 65–99)
Potassium: 4.8 mmol/L (ref 3.5–5.1)
SODIUM: 136 mmol/L (ref 135–145)

## 2017-01-21 MED ORDER — HEPARIN SODIUM (PORCINE) 1000 UNIT/ML DIALYSIS: 1000.0000 [IU] | INTRAMUSCULAR | Status: DC | PRN

## 2017-01-21 MED ORDER — LIDOCAINE HCL (PF) 1 % IJ SOLN: 5.0000 mL | INTRAMUSCULAR | Status: DC | PRN

## 2017-01-21 MED ORDER — ALTEPLASE 2 MG IJ SOLR: 2.0000 mg | Freq: Once | INTRAMUSCULAR | Status: DC | PRN

## 2017-01-21 MED ORDER — LIDOCAINE-PRILOCAINE 2.5-2.5 % EX CREA: 1.0000 "application " | TOPICAL_CREAM | CUTANEOUS | Status: DC | PRN

## 2017-01-21 MED ORDER — SODIUM CHLORIDE 0.9 % IV SOLN: 100.0000 mL | INTRAVENOUS | Status: DC | PRN

## 2017-01-21 MED ORDER — CLONIDINE HCL 0.1 MG PO TABS
0.1000 mg | ORAL_TABLET | Freq: Every day | ORAL | Status: DC
  Administered 2017-01-21 – 2017-01-22 (×2): 0.1 mg via ORAL
  Filled 2017-01-21 (×3): qty 1

## 2017-01-21 MED ORDER — PENTAFLUOROPROP-TETRAFLUOROETH EX AERO: 1.0000 "application " | INHALATION_SPRAY | CUTANEOUS | Status: DC | PRN

## 2017-01-21 NOTE — Progress Notes (Signed)
Progress Note  Patient Name: Jason [name] Date of Encounter: 01/21/2017  Primary Cardiologist: Kirk Ruths, MD   Subjective   No SOB or CP  Inpatient Medications    Scheduled Meds: . amLODipine  10 mg Oral Daily  . calcium acetate  667 mg Oral TID WC  . carvedilol  25 mg Oral BID WC  . feeding supplement (PRO-STAT SUGAR FREE 64)  30 mL Oral BID  . hydrALAZINE  100 mg Oral Q8H  . isosorbide mononitrate  60 mg Oral Daily   Continuous Infusions: . sodium chloride Stopped (01/20/17 1508)   PRN Meds: acetaminophen, heparin, HYDROmorphone (DILAUDID) injection, labetalol, oxyCODONE-acetaminophen   Vital Signs    Vitals:   01/20/17 2200 01/20/17 2257 01/21/17 0405 01/21/17 0658  BP: (!) 154/94 (!) 153/103 (!) 154/106 (!) 159/110  Pulse: 73 80 87   Resp: 20 (!) 23 (!) 21   Temp:  98 F (36.7 C) 98.5 F (36.9 C)   TempSrc:  Oral Oral   SpO2: 100% 93% 92%   Weight:      Height:        Intake/Output Summary (Last 24 hours) at 01/21/2017 0805 Last data filed at 01/21/2017 0407 Gross per 24 hour  Intake 1326 ml  Output 1826 ml  Net -500 ml   Filed Weights   01/18/17 1600 01/19/17 1410 01/19/17 1650  Weight: 236 lb 5.3 oz (107.2 kg) 225 lb 5 oz (102.2 kg) 219 lb 12.8 oz (99.7 kg)    Telemetry    Sinus with 6 beats NSVT- Personally Reviewed   Physical Exam   GEN: WD/WN NAD Neck: supple Cardiac: RRR Respiratory: CTA anteriorly GI: Soft, NT/ND MS: 1- 2 + edema; dressings in place Neuro:  no focal findings   Labs    Chemistry Recent Labs  Lab 01/16/17 1314  01/17/17 0531 01/19/17 0255 01/20/17 0214 01/21/17 0305  NA  --    < > 135 136 137 136  K  --    < > 5.6* 4.3 3.8 4.8  CL  --    < > 101 102 103 100*  CO2  --   --  22 22 25 27   GLUCOSE  --    < > 115* 92 117* 131*  BUN  --    < > 118* 100* 83* 81*  CREATININE  --    < > 6.50* 5.47* 4.96* 5.12*  CALCIUM  --   --  8.3* 7.6* 7.2* 7.3*  PROT 6.6  --  6.4*  --  5.1*  --   ALBUMIN 2.8*  --  2.7*   --  1.9*  --   AST 24  --  22  --  12*  --   ALT 64*  --  56  --  24  --   ALKPHOS 48  --  46  --  45  --   BILITOT 1.0  --  0.8  --  0.7  --   GFRNONAA  --   --  9* 11* 12* 12*  GFRAA  --   --  10* 12* 14* 13*  ANIONGAP  --   --  12 12 9 9    < > = values in this interval not displayed.     Hematology Recent Labs  Lab 01/19/17 0255 01/20/17 0214 01/21/17 0305  WBC 11.3* 9.5 12.7*  RBC 4.17* 3.94* 4.06*  HGB 11.8* 11.1* 11.4*  HCT 35.4* 33.3* 35.3*  MCV 84.9 84.5 86.9  MCH 28.3  28.2 28.1  MCHC 33.3 33.3 32.3  RDW 13.3 13.1 13.4  PLT 290 272 254    Cardiac Enzymes Recent Labs  Lab 01/16/17 1944 01/17/17 0117 01/17/17 0531  TROPONINI 0.37* 0.30* 0.29*    Recent Labs  Lab 01/16/17 1314  TROPIPOC 0.43*     BNP Recent Labs  Lab 01/16/17 1249  BNP >4,500.0*       Radiology    Dg Chest Port 1 View  Result Date: 01/20/2017 CLINICAL DATA:  Status post dialysis catheter placement EXAM: PORTABLE CHEST 1 VIEW COMPARISON:  01/16/2017 FINDINGS: Cardiac shadow remains enlarged. New dialysis catheter is noted in satisfactory position. The overall inspiratory effort is poor although no pneumothorax is seen. Mild vascular congestion is again noted. Left basilar atelectatic changes are seen. IMPRESSION: No pneumothorax following dialysis catheter placement. Mild left basilar atelectasis. Electronically Signed   By: Inez Catalina M.D.   On: 01/20/2017 15:28    Patient Profile     57 year old male with past medical history of hypertension, medical noncompliance and chronic kidney disease for evaluation of cardiomyopathy and elevated troponin.  Was admitted on January 6 with complaints of dyspnea on exertion, orthopnea, weight gain and lower extremity edema.  Patient's initial blood pressure was 224/178. Echocardiogram was personally reviewed.  Ejection fraction appears to be 15-20%.  There is moderate mitral and tricuspid regurgitation. Troponin I 0.43, 0.37, 0.30 and 0.29.      Assessment & Plan    1 cardiomyopathy-felt to be hypertensive mediated.  Not compliant with his blood pressure medications at time of admission.  Continue present dose of carvedilol, amlodipine and hydralazine/nitrates.  No ACEI/ARB given his renal insufficiency. Continue medications for 3 months and then repeat echocardiogram assuming his blood pressure has been controlled and he has been compliant with medications.  His LV function hopefully will improve once blood pressure is controlled.  If not would need ischemia evaluation at that time.  2 elevated troponin-no clear trend and no CP; no plans for ischemia eval at this point unless LV function does not improve.  3 hypertensive emergency-blood pressure remains mildly elevated.  Continue carvedilol, amlodipine, hydralazine/nitrates. Add clonidine 0.1 mg daily. As volume is removed with dialysis, expect BP to improve.   4 acute systolic/diastolic congestive heart failure-patient remains volume overloaded but improving.  Dialysis initiated for volume removal.  5 end-stage renal disease-per nephrology  6 lower extremity cellulitis-Antibiotics per IM.    For questions or updates, please contact [place] Please consult [email or web address] for contact info under Cardiology/STEMI.      Signed, Kirk Ruths, MD  01/21/2017, 8:05 AM

## 2017-01-21 NOTE — Progress Notes (Signed)
Pharmacy Antibiotic Note  Jason [name] is a 57 y.o. male on day #6 of Vancomycin for cellulitis.  He is new to dialysis. He was given a vancomycin load on 1/6 and started HD on 1/8. He has not had a full HD session yet (went 1/8, 1/9 and 1/10- all with BFRs 250-348mL/min for varying lengths of treatment).  Random Vancomycin level yesterday was 21 mcg/ml. Patient then went to HD for only 1.5h with BFR 348mL/min which would only remove ~12.5% total of vancomycin. Patient received vancomycin 1g after this abbreviated session. Pre-HD vancomycin level estimated to be ~46mcg/mL today.  Plan: Patient currently in HD and tolerating BFR 322mL/min- if he goes for between 3 and 4h, his vancomycin level post-HD will still be in therapeutic range, therefore will NOT give vancomycin dose today  Noted plan for patient to go to HD tomorrow- will re-dose vancomycin based on tolerance of session  Height: 6\' 2"  (188 cm) Weight: 220 lb 14.4 oz (100.2 kg) IBW/kg (Calculated) : 82.2  Temp (24hrs), Avg:98.4 F (36.9 C), Min:97.4 F (36.3 C), Max:99.1 F (37.3 C)  Recent Labs  Lab 01/16/17 1249 01/16/17 1316 01/17/17 0531 01/19/17 0255 01/20/17 0214 01/21/17 0305  WBC 6.0  --  17.6* 11.3* 9.5 12.7*  CREATININE 6.98* 7.00* 6.50* 5.47* 4.96* 5.12*  VANCORANDOM  --   --   --   --  21  --      Allergies  Allergen Reactions  . Lisinopril Cough    Antimicrobials this admission:  Vancomycin 1/6>>  Dose adjustments this admission: 1/10 VR = 8mcg/mL (pre-HD)  Microbiology results: 1/6 BCx: ngtd 1/7 MRSA PCR: neg  Thank you for allowing pharmacy to be a part of this patient's care.  Burch Marchuk D. Danamarie Minami, PharmD, BCPS Clinical Pharmacist Clinical Phone for 01/21/2017 until 3:30pm: [phone] If after 3:30pm, please call main pharmacy at [phone] 01/21/2017 11:30 AM

## 2017-01-21 NOTE — Progress Notes (Signed)
Bilateral leg wound dressings changed as ordered. Pt tolerated well.

## 2017-01-21 NOTE — Progress Notes (Signed)
[facility] ROUNDING NOTE   Subjective:   CKD stage 5 Presents to ER with volume overload . Started  dialysis Will need  CLIP  Discussed coordinator  He had another dialysis session this morning and will have Dr Scot Dock place permanent access     Objective:  Vital signs in last 24 hours:  Temp:  [97.4 F (36.3 C)-99.1 F (37.3 C)] 99.1 F (37.3 C) (01/11 0811) Pulse Rate:  [70-87] 82 (01/11 0811) Resp:  [16-24] 18 (01/11 0811) BP: (125-184)/(86-113) 154/107 (01/11 0811) SpO2:  [92 %-100 %] 100 % (01/11 0811)  Weight change:  Filed Weights   01/18/17 1600 01/19/17 1410 01/19/17 1650  Weight: 236 lb 5.3 oz (107.2 kg) 225 lb 5 oz (102.2 kg) 219 lb 12.8 oz (99.7 kg)    Intake/Output: I/O last 3 completed shifts: In: 2286 [P.O.:1486; I.V.:600; IV Piggyback:200] Out: 2926 [Urine:1900; Other:1006; Blood:20]   Intake/Output this shift:  Total I/O In: 240 [P.O.:240] Out: 325 [Urine:325]  CVS- RRR RS- CTA ABD- BS present soft non-distended EXT- lower extremity edema wrapped legs     Basic Metabolic Panel: Recent Labs  Lab 01/16/17 1249 01/16/17 1314 01/16/17 1316 01/17/17 0531 01/18/17 1650 01/19/17 0255 01/20/17 0214 01/21/17 0305  NA 135  --  136 135  --  136 137 136  K 5.4*  --  5.3* 5.6*  --  4.3 3.8 4.8  CL 102  --  102 101  --  102 103 100*  CO2 21*  --   --  22  --  22 25 27   GLUCOSE 105*  --  101* 115*  --  92 117* 131*  BUN 118*  --  94* 118*  --  100* 83* 81*  CREATININE 6.98*  --  7.00* 6.50*  --  5.47* 4.96* 5.12*  CALCIUM 8.8*  --   --  8.3*  --  7.6* 7.2* 7.3*  PHOS  --  7.9*  --   --  5.9*  --   --   --     Liver Function Tests: Recent Labs  Lab 01/16/17 1314 01/17/17 0531 01/20/17 0214  AST 24 22 12*  ALT 64* 56 24  ALKPHOS 48 46 45  BILITOT 1.0 0.8 0.7  PROT 6.6 6.4* 5.1*  ALBUMIN 2.8* 2.7* 1.9*   No results for input(s): LIPASE, AMYLASE in the last 168 hours. No results for input(s): AMMONIA in the last 168  hours.  CBC: Recent Labs  Lab 01/16/17 1249 01/16/17 1316 01/17/17 0531 01/19/17 0255 01/20/17 0214 01/21/17 0305  WBC 6.0  --  17.6* 11.3* 9.5 12.7*  NEUTROABS 5.1  --   --   --   --   --   HGB 14.1 14.3 14.0 11.8* 11.1* 11.4*  HCT 39.8 42.0 40.1 35.4* 33.3* 35.3*  MCV 85.4  --  85.5 84.9 84.5 86.9  PLT 370  --  362 290 272 254    Cardiac Enzymes: Recent Labs  Lab 01/16/17 1944 01/17/17 0117 01/17/17 0531  TROPONINI 0.37* 0.30* 0.29*    BNP: Invalid input(s): POCBNP  CBG: Recent Labs  Lab 01/19/17 0856 01/20/17 0914  GLUCAP 99 74    Microbiology: Results for orders placed or performed during the hospital encounter of 01/16/17  Culture, blood (routine x 2)     Status: None (Preliminary result)   Collection Time: 01/16/17  5:11 PM  Result Value Ref Range Status   Specimen Description BLOOD RIGHT ANTECUBITAL  Final   Special Requests  Final    BOTTLES DRAWN AEROBIC AND ANAEROBIC Blood Culture adequate volume   Culture   Final    NO GROWTH 3 DAYS Performed at [facility], [place] [address] [place], [place] [address]    Report Status PENDING  Incomplete  Culture, blood (routine x 2)     Status: None (Preliminary result)   Collection Time: 01/16/17  7:44 PM  Result Value Ref Range Status   Specimen Description BLOOD RIGHT ANTECUBITAL  Final   Special Requests   Final    BOTTLES DRAWN AEROBIC AND ANAEROBIC Blood Culture adequate volume   Culture   Final    NO GROWTH 3 DAYS Performed at [facility], [place] [address], [place], [place] [address]    Report Status PENDING  Incomplete  MRSA PCR Screening     Status: None   Collection Time: 01/17/17  9:31 PM  Result Value Ref Range Status   MRSA by PCR NEGATIVE NEGATIVE Final    Comment:        The GeneXpert MRSA Assay (FDA approved for NASAL specimens only), is one component of a comprehensive MRSA colonization surveillance program. It is not intended to diagnose MRSA infection nor to guide  or monitor treatment for MRSA infections.     Coagulation Studies: No results for input(s): LABPROT, INR in the last 72 hours.  Urinalysis: No results for input(s): COLORURINE, LABSPEC, PHURINE, GLUCOSEU, HGBUR, BILIRUBINUR, KETONESUR, PROTEINUR, UROBILINOGEN, NITRITE, LEUKOCYTESUR in the last 72 hours.  Invalid input(s): APPERANCEUR    Imaging: Dg Chest Port 1 View  Result Date: 01/20/2017 CLINICAL DATA:  Status post dialysis catheter placement EXAM: PORTABLE CHEST 1 VIEW COMPARISON:  01/16/2017 FINDINGS: Cardiac shadow remains enlarged. New dialysis catheter is noted in satisfactory position. The overall inspiratory effort is poor although no pneumothorax is seen. Mild vascular congestion is again noted. Left basilar atelectatic changes are seen. IMPRESSION: No pneumothorax following dialysis catheter placement. Mild left basilar atelectasis. Electronically Signed   By: Inez Catalina M.D.   On: 01/20/2017 15:28     Medications:   . sodium chloride Stopped (01/20/17 1508)   . amLODipine  10 mg Oral Daily  . calcium acetate  667 mg Oral TID WC  . carvedilol  25 mg Oral BID WC  . cloNIDine  0.1 mg Oral Daily  . feeding supplement (PRO-STAT SUGAR FREE 64)  30 mL Oral BID  . hydrALAZINE  100 mg Oral Q8H  . isosorbide mononitrate  60 mg Oral Daily   acetaminophen, heparin, HYDROmorphone (DILAUDID) injection, labetalol, oxyCODONE-acetaminophen  Assessment/ Plan:  1. CKD - lost to f/u. Has progressive CKD, now likely ESRD Started HD . Left handed, save R arm  Appreciate Dr Scot Dock assisting with permanent access 2. Severe HTN - improved with dialysis and volume challenged and will continue oral antihypertensives for now - has another treatment ordered for dialysis and fluid removal  3. Anemia Hb 11 4. Bones Check Phos 5.9  and PTH Pending and Calcium 8.6  Start         calcium acetate 667 mg 1 with meals       LOS: 5 Gemma Ruan W @TODAY @10 :16 AM

## 2017-01-21 NOTE — Progress Notes (Signed)
Accepted at [facility] . 1st treatment Tuesday January 15,2019 at 10:45 .Schedule and chairtime Tuesday,Thursday,Saturday at 12:00pm.

## 2017-01-21 NOTE — Progress Notes (Signed)
Dialysis treatment terminated per Nephrology d/t HR sustaining in 130's despite intervention.  2500 mL ultrafiltrated.  2000 mL net fluid removal.  Patient status unchanged. Lung sounds clear to ausculation in all fields. BLE 3+ pitting edema. Cardiac: NSR.  Cleansed RIJ catheter with chlorhexidine.  Disconnected lines and flushed ports with saline per protocol.  Ports locked with heparin and capped per protocol.    Report given to bedside, RN Robin.

## 2017-01-21 NOTE — Progress Notes (Signed)
Patient arrived to unit by bed.  Reviewed treatment plan and this RN agrees with plan.  Report received from bedside RN, Shirlean Mylar.  Consent verified.  Patient A & O X 4.   Lung sounds diminished to ausculation in all fields. BLE 3+ pitting edema. Cardiac:  NSR.  Removed caps and cleansed RIJ catheter with chlorhedxidine.  Aspirated ports of heparin and flushed them with saline per protocol.  Connected and secured lines, initiated treatment at 1042.  UF Goal of 3500 mL and net fluid removal 3 L.  Will continue to monitor.

## 2017-01-21 NOTE — Progress Notes (Signed)
[facility]                                                                              Patient Demographics  Jason [name], is a 57 y.o. male, DOB - 16-Mar-1960, GUY:[id number]  Admit date - 01/16/2017   Admitting Physician A Jason [name]., MD  Outpatient Primary MD for the patient is Jason Mannan, NP  Outpatient specialists:   LOS - 5  days   Medical records reviewed and are as summarized below:    Chief Complaint  Patient presents with  . Leg Swelling       Brief summary   Jason [name] a 57 y.o.malewith medical history significant ofhypertension, chronic kidney disease, anemia, inguinal hernia presenting with worsening shortness of breath, lower extremity edema, and altered mental status.in the ED, hypertensive emergency with worsening renal function, heart failure, acute encephalopathy and bilateral chronic venous stasis with stasis dermatitis.   Seen and examined with no family member at his bedside. Dialysis terminated prematurelly due to HR in the 130's. 2L fluid removed. Denies chest pain, dyspnea or palpitations. Persistent LE edema 3+ pitting edema bilaterally. Lungs are clear to auscultation. O2 saturation improving.    Assessment & Plan    Principal Problem:    Acute hypoxic respiratory failure secondary to acute on chronic HFREF 35-40%, hypervolemia due to ESRD -2D echo showed EF of 35-40% with moderate diffuse hypokinesis, grade 2 diastolic dysfunction -Cardiology has been consulted and following -Hemodialysis helping with peripheral edema and CHF exacerbation  Hypertensive emergency, improving on HD - BP 224/171 at the time of admission  - BP currently improved, continue amlodipine, Coreg, hydralazine - will continue to improve on hemodialysis  - continue dialysis to improve BP and fluid status - on clonidine po at home  Active Problems:   Acute kidney injury ([place]) on chronic kidney disease, stage IV, progressed to new  ESRD -Nephrology consulted, following for hemodialysis.  Lasix discontinued. -Vascular surgery consulted for permanent access -IR consulted for [facility]   Elevated troponin -Troponin peaked at 0.43 and trended down suspect due to ESRD and demand ischemia -2D echo with EF of 35-40%  (01/17/17) with moderate diffuse hypokinesis  -Cardiology following -Repeat echo in 3 months, if LVEF not improved cardiology will assess for ischemia  Bilateral chronic venous stasis with stasis dermatitis with suspected super imposed cellulitis -CT with severe soft tissue edema -Wound care following, dressings addressed -Continue IV vancomycin day # 6. Managed by pharmacy -CBC, BMET pending, blood cultures negative in 2 days  Left knee pain -Rule out acute gout, placed on pain control -ESR normal -CRP, uric acid elevated in the setting of ESRD  Medical non compliance, improving -Non compliance with medical management is improving  -Psychiatry consulted and determined patient has capacity.  Code Status: Full  DVT Prophylaxis: Heparin sq TID Family Communication: No family members at bedside Disposition Plan: will stay another midnight for IV antibiotics, to continue hemodialysis and to continue blood pressure control.  Time Spent in minutes  35 minutes  Procedures:  2D echo: Systolic function was moderately reduced.The estimated ejection fraction was in the range of 35% to 40%.Moderate  diffuse hypokinesis. Features are consistent with a pseudonormal left ventricular filling pattern, with concomitant abnormal relaxation and increased filling pressure (grade 2 diastolic dysfunction)  ABI: Normal   Consultants:   Nephrology Vascular surgery Interventional radiology  Antimicrobials:   IV vancomycin day# 6   Medications  Scheduled Meds: . amLODipine  10 mg Oral Daily  . calcium acetate  667 mg Oral TID WC  . carvedilol  25 mg Oral BID WC  . cloNIDine  0.1 mg Oral Daily  . feeding  supplement (PRO-STAT SUGAR FREE 64)  30 mL Oral BID  . hydrALAZINE  100 mg Oral Q8H  . isosorbide mononitrate  60 mg Oral Daily   Continuous Infusions: . sodium chloride Stopped (01/20/17 1508)   PRN Meds:.acetaminophen, heparin, HYDROmorphone (DILAUDID) injection, labetalol, oxyCODONE-acetaminophen   Antibiotics   Anti-infectives (From admission, onward)   Start     Dose/Rate Route Frequency Ordered Stop   01/21/17 0600  cefUROXime (ZINACEF) 1.5 g in dextrose 5 % 50 mL IVPB  Status:  Discontinued     1.5 g 100 mL/hr over 30 Minutes Intravenous To ShortStay Surgical 01/20/17 0749 01/20/17 1208   01/20/17 1900  vancomycin (VANCOCIN) IVPB 1000 mg/200 mL premix     1,000 mg 200 mL/hr over 60 Minutes Intravenous  Once 01/20/17 1736 01/20/17 2129   01/20/17 1227  dextrose 5 % with cefUROXime (ZINACEF) ADS Med    Comments:  Jason [name]   : cabinet override      01/20/17 1227 01/20/17 1338   01/19/17 1600  vancomycin (VANCOCIN) IVPB 1000 mg/200 mL premix     1,000 mg 200 mL/hr over 60 Minutes Intravenous To Hemodialysis 01/19/17 1520 01/19/17 1825   01/16/17 1800  vancomycin (VANCOCIN) 2,000 mg in sodium chloride 0.9 % 500 mL IVPB     2,000 mg 250 mL/hr over 120 Minutes Intravenous  Once 01/16/17 1734 01/16/17 2344   01/16/17 1730  vancomycin (VANCOCIN) IVPB 1000 mg/200 mL premix  Status:  Discontinued     1,000 mg 200 mL/hr over 60 Minutes Intravenous  Once 01/16/17 1720 01/16/17 1734       Objective:   Vitals:   01/21/17 1309 01/21/17 1315 01/21/17 1320 01/21/17 1400  BP:  101/76 121/67 125/75  Pulse: 68 (!) 133 76 73  Resp:  18  17  Temp:  98.6 F (37 C)  (!) 97.4 F (36.3 C)  TempSrc:    Axillary  SpO2:    98%  Weight:  98.2 kg (216 lb 7.9 oz)    Height:        Intake/Output Summary (Last 24 hours) at 01/21/2017 1554 Last data filed at 01/21/2017 1320 Gross per 24 hour  Intake 1066 ml  Output 3125 ml  Net -2059 ml     Wt Readings from Last 3 Encounters:   01/21/17 98.2 kg (216 lb 7.9 oz)  04/04/16 93 kg (205 lb)  02/26/15 95.3 kg (210 lb)     Exam  General: Alert and oriented x 3, NAD  Eyes: pupils are round and reactive to light  HEENT:  Mucous membranes are dry no erythema or exudates  Cardiovascular: S1 S2 clear, RRR   Respiratory: Bibasilar crackles  Gastrointestinal: Soft, nontender, nondistended, + bowel sounds  Ext: bilateral lower extremity edema up to tights with drainage to bilateral lower extremity  Neuro: no new deficits  Musculoskeletal: No digital cyanosis, clubbing. 3+LE edema with oozing bilaterally.  Skin: Erythema, drainage on b/l LE  Psych: Normal affect and demeanor,  alert and oriented x3    Data Reviewed:  I have personally reviewed following labs and imaging studies  Micro Results Recent Results (from the past 240 hour(s))  Culture, blood (routine x 2)     Status: None (Preliminary result)   Collection Time: 01/16/17  5:11 PM  Result Value Ref Range Status   Specimen Description BLOOD RIGHT ANTECUBITAL  Final   Special Requests   Final    BOTTLES DRAWN AEROBIC AND ANAEROBIC Blood Culture adequate volume   Culture   Final    NO GROWTH 4 DAYS Performed at [facility], [address], [place], [place] [address]    Report Status PENDING  Incomplete  Culture, blood (routine x 2)     Status: None (Preliminary result)   Collection Time: 01/16/17  7:44 PM  Result Value Ref Range Status   Specimen Description BLOOD RIGHT ANTECUBITAL  Final   Special Requests   Final    BOTTLES DRAWN AEROBIC AND ANAEROBIC Blood Culture adequate volume   Culture   Final    NO GROWTH 4 DAYS Performed at [facility], [place] [address], [place], [place] [address]    Report Status PENDING  Incomplete  MRSA PCR Screening     Status: None   Collection Time: 01/17/17  9:31 PM  Result Value Ref Range Status   MRSA by PCR NEGATIVE NEGATIVE Final    Comment:        The GeneXpert MRSA Assay (FDA approved for  NASAL specimens only), is one component of a comprehensive MRSA colonization surveillance program. It is not intended to diagnose MRSA infection nor to guide or monitor treatment for MRSA infections.     Radiology Reports Dg Chest 2 View  Result Date: 01/16/2017 CLINICAL DATA:  Shortness of breath, BILATERAL lower extremity edema with weeping wounds, on oxygen, history hypertension, former smoker EXAM: CHEST  2 VIEW COMPARISON:  Non FINDINGS: Enlargement of cardiac silhouette with pulmonary vascular congestion. Rotated to the RIGHT. Subsegmental atelectasis LEFT mid lung and at both bases greater on LEFT. Minimal LEFT pleural effusion. No definite acute infiltrate, pleural effusion or pneumothorax. IMPRESSION: Enlargement of cardiac silhouette with vascular congestion. Scattered atelectasis greater on LEFT with minimal LEFT pleural effusion. Electronically Signed   By: Lavonia Dana M.D.   On: 01/16/2017 13:09   Ct Head Wo Contrast  Result Date: 01/16/2017 CLINICAL DATA:  Headache hypertension EXAM: CT HEAD WITHOUT CONTRAST TECHNIQUE: Contiguous axial images were obtained from the base of the skull through the vertex without intravenous contrast. COMPARISON:  None. FINDINGS: Motion degraded images limit assessment. Brain: No evidence of acute large vascular territory infarction, hemorrhage, hydrocephalus, extra-axial collection or mass lesion/mass effect. No intraventricular hemorrhage. No effacement of the basal cisterns or fourth ventricle. Hyperdensity along the falx likely related to falcine calcifications, without eccentric or focal thickening to suggest hemorrhage. Minimal periventricular white matter hypodensities consistent with small vessel ischemic disease. Vascular: No hyperdense vessel or unexpected calcification. Skull: Negative for fracture or focal lesion. Sinuses/Orbits: No acute finding. Other: None IMPRESSION: Limited by motion artifacts. Minimal small vessel ischemic disease. No  acute intracranial abnormality. Electronically Signed   By: Ashley Royalty M.D.   On: 01/16/2017 18:09   US Renal  Result Date: 01/16/2017 CLINICAL DATA:  Acute renal failure.  History of hypertension. EXAM: RENAL / URINARY TRACT ULTRASOUND COMPLETE COMPARISON:  01/29/2015 FINDINGS: Right Kidney: Length: 9.9 cm. Echogenic renal parenchyma. No focal mass or hydronephrosis. Left Kidney: Length: 9.2 cm.  Echogenic renal parenchyma. No focal mass or hydronephrosis. Bladder: Small amount of debris is noted within the bladder. Additional: Bilateral pleural effusions are noted. IMPRESSION: 1. Echogenic renal parenchyma bilaterally, compatible with the history of renal failure. No hydronephrosis. 2. Bilateral pleural effusions. Electronically Signed   By: Nolon Nations M.D.   On: 01/16/2017 18:47   Ct Tibia Fibula Left Wo Contrast  Result Date: 01/16/2017 CLINICAL DATA:  Cellulitis.  Evaluate for osteomyelitis. EXAM: CT OF THE LOWER LEFT EXTREMITY WITHOUT CONTRAST TECHNIQUE: Multidetector CT imaging of the lower left extremity was performed according to the standard protocol. COMPARISON:  None. FINDINGS: Bones/Joint/Cartilage No fracture or dislocation. Normal alignment. No joint effusion. Moderate medial femorotibial compartment joint space narrowing with marginal osteophytes and subchondral sclerosis. Mild lateral femorotibial compartment joint space narrowing with small marginal osteophytes. Small patellofemoral compartment marginal osteophytes. Subchondral cystic changes in the medial corner of the talar dome. Small anterior tibial plafond marginal osteophyte. No aggressive osseous lesion. No periosteal reaction or bone destruction. Ligaments Ligaments are suboptimally evaluated by CT. Muscles and Tendons Muscles are normal.  No muscle atrophy. Soft tissue Severe soft tissue edema within the subcutaneous fat of the left lower leg. Severe soft tissue edema within the subcutaneous fat of the visualized portions of  the right lower leg. No soft tissue mass. IMPRESSION: 1. Severe soft tissue edema within the subcutaneous fat of the left lower leg. Severe soft tissue edema within the subcutaneous fat of the visualized portions of the right lower leg. Differential considerations include severe cellulitis versus venous stasis. Electronically Signed   By: Kathreen Devoid   On: 01/16/2017 17:32   Ir Fluoro Guide Cv Line Right  Result Date: 01/18/2017 CLINICAL DATA:  Renal failure, needs venous access for hemodialysis EXAM: EXAM RIGHT IJ CATHETER PLACEMENT UNDER ULTRASOUND AND FLUOROSCOPIC GUIDANCE TECHNIQUE: The procedure, risks (including but not limited to bleeding, infection, organ damage, pneumothorax), benefits, and alternatives were explained to the patient. Questions regarding the procedure were encouraged and answered. The patient understands and consents to the procedure. Patency of the right IJ vein was confirmed with ultrasound with image documentation. An appropriate skin site was determined. Skin site was marked. Region was prepped using maximum barrier technique including cap and mask, sterile gown, sterile gloves, large sterile sheet, and Chlorhexidine as cutaneous antisepsis. The region was infiltrated locally with 1% lidocaine. Under real-time ultrasound guidance, the right IJ vein was accessed with a 21 gauge needle; the needle tip within the vein was confirmed with ultrasound image documentation. The needle exchanged over a 018 guidewire for vascular dilator which allowed advancement of a 20 cm Mahurkar catheter. This was positioned with the tip at the cavoatrial junction. Spot chest radiograph shows good positioning and no pneumothorax. Catheter was flushed and sutured externally with 0-Prolene sutures. Patient tolerated the procedure well. FLUOROSCOPY TIME:  Less than 6 seconds COMPLICATIONS: COMPLICATIONS none IMPRESSION: 1. Technically successful right IJ Mahurkar catheter placement. Electronically Signed   By:  Lucrezia Europe M.D.   On: 01/18/2017 12:53   Ir US Guide Vasc Access Right  Result Date: 01/18/2017 CLINICAL DATA:  Renal failure, needs venous access for hemodialysis EXAM: EXAM RIGHT IJ CATHETER PLACEMENT UNDER ULTRASOUND AND FLUOROSCOPIC GUIDANCE TECHNIQUE: The procedure, risks (including but not limited to bleeding, infection, organ damage, pneumothorax), benefits, and alternatives were explained to the patient. Questions regarding the procedure were encouraged and answered. The patient understands and consents to the procedure. Patency of the right IJ vein was confirmed with ultrasound with image  documentation. An appropriate skin site was determined. Skin site was marked. Region was prepped using maximum barrier technique including cap and mask, sterile gown, sterile gloves, large sterile sheet, and Chlorhexidine as cutaneous antisepsis. The region was infiltrated locally with 1% lidocaine. Under real-time ultrasound guidance, the right IJ vein was accessed with a 21 gauge needle; the needle tip within the vein was confirmed with ultrasound image documentation. The needle exchanged over a 018 guidewire for vascular dilator which allowed advancement of a 20 cm Mahurkar catheter. This was positioned with the tip at the cavoatrial junction. Spot chest radiograph shows good positioning and no pneumothorax. Catheter was flushed and sutured externally with 0-Prolene sutures. Patient tolerated the procedure well. FLUOROSCOPY TIME:  Less than 6 seconds COMPLICATIONS: COMPLICATIONS none IMPRESSION: 1. Technically successful right IJ Mahurkar catheter placement. Electronically Signed   By: Lucrezia Europe M.D.   On: 01/18/2017 12:53   Dg Chest Port 1 View  Result Date: 01/20/2017 CLINICAL DATA:  Status post dialysis catheter placement EXAM: PORTABLE CHEST 1 VIEW COMPARISON:  01/16/2017 FINDINGS: Cardiac shadow remains enlarged. New dialysis catheter is noted in satisfactory position. The overall inspiratory effort is poor  although no pneumothorax is seen. Mild vascular congestion is again noted. Left basilar atelectatic changes are seen. IMPRESSION: No pneumothorax following dialysis catheter placement. Mild left basilar atelectasis. Electronically Signed   By: Inez Catalina M.D.   On: 01/20/2017 15:28    Lab Data:  CBC: Recent Labs  Lab 01/16/17 1249 01/16/17 1316 01/17/17 0531 01/19/17 0255 01/20/17 0214 01/21/17 0305  WBC 6.0  --  17.6* 11.3* 9.5 12.7*  NEUTROABS 5.1  --   --   --   --   --   HGB 14.1 14.3 14.0 11.8* 11.1* 11.4*  HCT 39.8 42.0 40.1 35.4* 33.3* 35.3*  MCV 85.4  --  85.5 84.9 84.5 86.9  PLT 370  --  362 290 272 373   Basic Metabolic Panel: Recent Labs  Lab 01/16/17 1249 01/16/17 1314 01/16/17 1316 01/17/17 0531 01/18/17 1650 01/19/17 0255 01/20/17 0214 01/21/17 0305  NA 135  --  136 135  --  136 137 136  K 5.4*  --  5.3* 5.6*  --  4.3 3.8 4.8  CL 102  --  102 101  --  102 103 100*  CO2 21*  --   --  22  --  _0 GLUCOSE 105*  --  101* 115*  --  92 117* 131*  BUN 118*  --  94* 118*  --  100* 83* 81*  CREATININE 6.98*  --  7.00* 6.50*  --  5.47* 4.96* 5.12*  CALCIUM 8.8*  --   --  8.3*  --  7.6* 7.2* 7.3*  PHOS  --  7.9*  --   --  5.9*  --   --   --    GFR: Estimated Creatinine Clearance: 19 mL/min (A) (by C-G formula based on SCr of 5.12 mg/dL (H)). Liver Function Tests: Recent Labs  Lab 01/16/17 1314 01/17/17 0531 01/20/17 0214  AST 24 22 12*  ALT 64* 56 24  ALKPHOS 48 46 45  BILITOT 1.0 0.8 0.7  PROT 6.6 6.4* 5.1*  ALBUMIN 2.8* 2.7* 1.9*   No results for input(s): LIPASE, AMYLASE in the last 168 hours. No results for input(s): AMMONIA in the last 168 hours. Coagulation Profile: No results for input(s): INR, PROTIME in the last 168 hours. Cardiac Enzymes: Recent Labs  Lab 01/16/17 1944 01/17/17  0117 01/17/17 0531  TROPONINI 0.37* 0.30* 0.29*   BNP (last 3 results) No results for input(s): PROBNP in the last 8760 hours. HbA1C: No results for  input(s): HGBA1C in the last 72 hours. CBG: Recent Labs  Lab 01/19/17 0856 01/20/17 0914  GLUCAP 99 87   Lipid Profile: No results for input(s): CHOL, HDL, LDLCALC, TRIG, CHOLHDL, LDLDIRECT in the last 72 hours. Thyroid Function Tests: No results for input(s): TSH, T4TOTAL, FREET4, T3FREE, THYROIDAB in the last 72 hours. Anemia Panel: No results for input(s): VITAMINB12, FOLATE, FERRITIN, TIBC, IRON, RETICCTPCT in the last 72 hours. Urine analysis:    Component Value Date/Time   COLORURINE YELLOW 01/16/2017 1420   APPEARANCEUR CLEAR 01/16/2017 1420   LABSPEC 1.013 01/16/2017 1420   PHURINE 5.0 01/16/2017 1420   GLUCOSEU NEGATIVE 01/16/2017 1420   HGBUR SMALL (A) 01/16/2017 1420   BILIRUBINUR NEGATIVE 01/16/2017 1420   KETONESUR NEGATIVE 01/16/2017 1420   PROTEINUR 100 (A) 01/16/2017 1420   UROBILINOGEN 0.2 02/26/2015 1028   NITRITE NEGATIVE 01/16/2017 1420   LEUKOCYTESUR NEGATIVE 01/16/2017 1420     Kayleen Memos M.D. [facility] 01/21/2017, 3:54 PM  Pager: [phone] Between 7am to 7pm - call Pager - [phone]  After 7pm go to [email or web address] - password [email or web address]  Call night coverage person covering after 7pm

## 2017-01-21 NOTE — Care Management Note (Signed)
Case Management Note  Patient Details  Name: Delvin Hedeen MRN: [id number] Date of Birth: 31-Aug-1960  Subjective/Objective:     Pt admitted with cellulitis, hypertensive emergency, worsening RF  and SOB.                Action/Plan:  PTA from home.  ESRD not yet on outpt HD - however it has been determined that pt will now require perm HD - pt will need to be clipped.  Pt will need appt at [facility] and possibly [facility] at discharge.  CM will continue to follow for discharge needs   Expected Discharge Date:  (unknown)               Expected Discharge Plan:  Home/Self Care  In-House Referral:  Clinical Social Work  Discharge planning Services  CM Consult  Post Acute Care Choice:    Choice offered to:  Patient  DME Arranged:    DME Agency:     HH Arranged:    [place] Agency:     Status of Service:  In process, will continue to follow  If discussed at Long Length of Stay Meetings, dates discussed:    Additional Comments: 01/21/2017  Pt is now s/p fistula placement with daily HD assessments.  Pt will need to be CLIPPED.   01/20/17 Pt refusing SNF (CSW received consult) and per note also refusing perm access for HD, however will allow daily HD here in house for now. CM requested Palliative Consult - attending has decided to persue psych consult first.   CM will continue to follow for discharge needs Maryclare Labrador, RN 01/21/2017, 3:53 PM

## 2017-01-21 NOTE — Progress Notes (Addendum)
[facility]  Subjective  - Doing fine after surgery.  No new complaints.   Objective (!) 159/110 87 98.5 F (36.9 C) (Oral) (!) 21 92%  Intake/Output Summary (Last 24 hours) at 01/21/2017 0806 Last data filed at 01/21/2017 0407 Gross per 24 hour  Intake 1326 ml  Output 1826 ml  Net -500 ml    Right hand with 5/5 grip, sensation intact and equal B. Right anti cubital incision healing well.  Palpable thrill throughout entire cephalic vein. Right TDC site clean an dry, no hematoma at previous IJ site.  Assessment/Planning: POD # 1 Right brachial Cephalic fistula creation with excellent palpable thrill.  No sighs or symptoms of steal noted in the right UE.  New TDC site after temp cath removed pressure held without resulting hematoma. Stable post op course.  He will f/u with Dr. Scot Dock in our office in about 6 weeks.  At that time we will order a fistula duplex.  Do not stick right AV fistula for at least 12 weeks.  Roxy Horseman 01/21/2017 8:06 AM --  Laboratory Lab Results: Recent Labs    01/20/17 0214 01/21/17 0305  WBC 9.5 12.7*  HGB 11.1* 11.4*  HCT 33.3* 35.3*  PLT 272 254   BMET Recent Labs    01/20/17 0214 01/21/17 0305  NA 137 136  K 3.8 4.8  CL 103 100*  CO2 25 27  GLUCOSE 117* 131*  BUN 83* 81*  CREATININE 4.96* 5.12*  CALCIUM 7.2* 7.3*    COAG No results found for: INR, PROTIME No results found for: PTT  I have interviewed the patient and examined the patient. I agree with the findings by the PA. Vascular surgery will be available as needed.  Gae Gallop, MD [phone]

## 2017-01-22 DIAGNOSIS — Z992 Dependence on renal dialysis: Secondary | ICD-10-CM

## 2017-01-22 LAB — RENAL FUNCTION PANEL
Albumin: 1.7 g/dL — ABNORMAL LOW (ref 3.5–5.0)
Anion gap: 9 (ref 5–15)
BUN: 71 mg/dL — AB (ref 6–20)
CHLORIDE: 103 mmol/L (ref 101–111)
CO2: 23 mmol/L (ref 22–32)
CREATININE: 4.69 mg/dL — AB (ref 0.61–1.24)
Calcium: 7.1 mg/dL — ABNORMAL LOW (ref 8.9–10.3)
GFR calc Af Amer: 15 mL/min — ABNORMAL LOW (ref >60)
GFR calc non Af Amer: 13 mL/min — ABNORMAL LOW (ref >60)
Glucose, Bld: 104 mg/dL — ABNORMAL HIGH (ref 65–99)
POTASSIUM: 3.9 mmol/L (ref 3.5–5.1)
Phosphorus: 3.6 mg/dL (ref 2.5–4.6)
Sodium: 135 mmol/L (ref 135–145)

## 2017-01-22 LAB — CBC
HEMATOCRIT: 28.1 % — AB (ref 39.0–52.0)
Hemoglobin: 9.3 g/dL — ABNORMAL LOW (ref 13.0–17.0)
MCH: 28.5 pg (ref 26.0–34.0)
MCHC: 33.1 g/dL (ref 30.0–36.0)
MCV: 86.2 fL (ref 78.0–100.0)
PLATELETS: 224 10*3/uL (ref 150–400)
RBC: 3.26 MIL/uL — ABNORMAL LOW (ref 4.22–5.81)
RDW: 13.4 % (ref 11.5–15.5)
WBC: 10.3 10*3/uL (ref 4.0–10.5)

## 2017-01-22 LAB — CULTURE, BLOOD (ROUTINE X 2)
CULTURE: NO GROWTH
Culture: NO GROWTH
Special Requests: ADEQUATE
Special Requests: ADEQUATE

## 2017-01-22 MED ORDER — PENTAFLUOROPROP-TETRAFLUOROETH EX AERO: 1.0000 "application " | INHALATION_SPRAY | CUTANEOUS | Status: DC | PRN

## 2017-01-22 MED ORDER — SODIUM CHLORIDE 0.9 % IV SOLN: 100.0000 mL | INTRAVENOUS | Status: DC | PRN

## 2017-01-22 MED ORDER — HEPARIN SODIUM (PORCINE) 1000 UNIT/ML DIALYSIS: 1000.0000 [IU] | INTRAMUSCULAR | Status: DC | PRN

## 2017-01-22 MED ORDER — ALTEPLASE 2 MG IJ SOLR: 2.0000 mg | Freq: Once | INTRAMUSCULAR | Status: DC | PRN

## 2017-01-22 MED ORDER — VANCOMYCIN HCL IN DEXTROSE 1-5 GM/200ML-% IV SOLN
1000.0000 mg | Freq: Once | INTRAVENOUS | Status: AC
  Administered 2017-01-22: 1000 mg via INTRAVENOUS
  Filled 2017-01-22 (×2): qty 200

## 2017-01-22 MED ORDER — LIDOCAINE-PRILOCAINE 2.5-2.5 % EX CREA: 1.0000 "application " | TOPICAL_CREAM | CUTANEOUS | Status: DC | PRN

## 2017-01-22 MED ORDER — LIDOCAINE HCL (PF) 1 % IJ SOLN: 5.0000 mL | INTRAMUSCULAR | Status: DC | PRN

## 2017-01-22 NOTE — Progress Notes (Signed)
Pt removed bilateral leg dressings and does not want new dressings at this time. He states he wants them to get some air. Explained infection and pt states I can put new dressings back on later today.

## 2017-01-22 NOTE — Progress Notes (Signed)
Progress Note  Patient Name: Jason [name] Date of Encounter: 01/22/2017  Primary Cardiologist: Kirk Ruths, MD   Subjective   Mild dyspnea; no CP  Inpatient Medications    Scheduled Meds: . amLODipine  10 mg Oral Daily  . calcium acetate  667 mg Oral TID WC  . carvedilol  25 mg Oral BID WC  . cloNIDine  0.1 mg Oral Daily  . feeding supplement (PRO-STAT SUGAR FREE 64)  30 mL Oral BID  . hydrALAZINE  100 mg Oral Q8H  . isosorbide mononitrate  60 mg Oral Daily   Continuous Infusions: . sodium chloride Stopped (01/20/17 1508)   PRN Meds: acetaminophen, heparin, HYDROmorphone (DILAUDID) injection, labetalol, oxyCODONE-acetaminophen   Vital Signs    Vitals:   01/21/17 2224 01/21/17 2328 01/22/17 0411 01/22/17 0803  BP: 130/89 (!) 141/84 (!) 147/92 (!) 140/92  Pulse:  80 84 82  Resp:  (!) 24 (!) 28 19  Temp:  98.5 F (36.9 C) 99 F (37.2 C)   TempSrc:  Oral Oral   SpO2:  100% 100% 99%  Weight:      Height:        Intake/Output Summary (Last 24 hours) at 01/22/2017 0838 Last data filed at 01/21/2017 1617 Gross per 24 hour  Intake 480 ml  Output 2325 ml  Net -1845 ml   Filed Weights   01/19/17 1650 01/21/17 1032 01/21/17 1315  Weight: 219 lb 12.8 oz (99.7 kg) 220 lb 14.4 oz (100.2 kg) 216 lb 7.9 oz (98.2 kg)    Telemetry    Sinus with occasional PVC and brief PAT- Personally Reviewed   Physical Exam   GEN: WD, NAD Neck: No JVD Cardiac: RRR, no murmur Respiratory: CTA; no wheeze GI: Soft, NT/ND, no masses MS: 2 + edema Neuro:  grossly intact   Labs    Chemistry Recent Labs  Lab 01/16/17 1314  01/17/17 0531 01/19/17 0255 01/20/17 0214 01/21/17 0305  NA  --    < > 135 136 137 136  K  --    < > 5.6* 4.3 3.8 4.8  CL  --    < > 101 102 103 100*  CO2  --   --  22 22 25 27   GLUCOSE  --    < > 115* 92 117* 131*  BUN  --    < > 118* 100* 83* 81*  CREATININE  --    < > 6.50* 5.47* 4.96* 5.12*  CALCIUM  --   --  8.3* 7.6* 7.2* 7.3*  PROT 6.6  --   6.4*  --  5.1*  --   ALBUMIN 2.8*  --  2.7*  --  1.9*  --   AST 24  --  22  --  12*  --   ALT 64*  --  56  --  24  --   ALKPHOS 48  --  46  --  45  --   BILITOT 1.0  --  0.8  --  0.7  --   GFRNONAA  --   --  9* 11* 12* 12*  GFRAA  --   --  10* 12* 14* 13*  ANIONGAP  --   --  12 12 9 9    < > = values in this interval not displayed.     Hematology Recent Labs  Lab 01/19/17 0255 01/20/17 0214 01/21/17 0305  WBC 11.3* 9.5 12.7*  RBC 4.17* 3.94* 4.06*  HGB 11.8* 11.1* 11.4*  HCT 35.4*  33.3* 35.3*  MCV 84.9 84.5 86.9  MCH 28.3 28.2 28.1  MCHC 33.3 33.3 32.3  RDW 13.3 13.1 13.4  PLT 290 272 254    Cardiac Enzymes Recent Labs  Lab 01/16/17 1944 01/17/17 0117 01/17/17 0531  TROPONINI 0.37* 0.30* 0.29*    Recent Labs  Lab 01/16/17 1314  TROPIPOC 0.43*     BNP Recent Labs  Lab 01/16/17 1249  BNP >4,500.0*       Radiology    Dg Chest Port 1 View  Result Date: 01/20/2017 CLINICAL DATA:  Status post dialysis catheter placement EXAM: PORTABLE CHEST 1 VIEW COMPARISON:  01/16/2017 FINDINGS: Cardiac shadow remains enlarged. New dialysis catheter is noted in satisfactory position. The overall inspiratory effort is poor although no pneumothorax is seen. Mild vascular congestion is again noted. Left basilar atelectatic changes are seen. IMPRESSION: No pneumothorax following dialysis catheter placement. Mild left basilar atelectasis. Electronically Signed   By: Inez Catalina M.D.   On: 01/20/2017 15:28    Patient Profile     57 year old male with past medical history of hypertension, medical noncompliance and chronic kidney disease for evaluation of cardiomyopathy and elevated troponin.  Was admitted on January 6 with complaints of dyspnea on exertion, orthopnea, weight gain and lower extremity edema.  Patient's initial blood pressure was 224/178. Echocardiogram was personally reviewed.  Ejection fraction appears to be 15-20%.  There is moderate mitral and tricuspid  regurgitation. Troponin I 0.43, 0.37, 0.30 and 0.29.     Assessment & Plan    1 cardiomyopathy-likely hypertensive mediated.  Pt was not compliant with his blood pressure medications at time of admission.  Continue present dose of carvedilol, amlodipine and hydralazine/nitrates.  No ACEI/ARB given his renal insufficiency. Plan to continue medications for 3 months and then repeat echocardiogram.  Hopefully LV function will improve once blood pressure is controlled.  If not would need ischemia evaluation at that time.  2 elevated troponin-as outlined previously, no plans for further ischemia eval at this time.  3 hypertensive emergency-blood pressure improved.  Continue carvedilol, amlodipine, hydralazine/nitrates and clonidine 0.1 mg daily. Will need to watch BP closely following DC as he may develop hypotension as more volume is removed with dialysis.  4 acute systolic/diastolic congestive heart failure-continue volume management with dialysis.  5 end-stage renal disease-per nephrology  6 lower extremity cellulitis-Antibiotics per IM.    For questions or updates, please contact [place] Please consult [email or web address] for contact info under Cardiology/STEMI.      Signed, Kirk Ruths, MD  01/22/2017, 8:38 AM

## 2017-01-22 NOTE — Progress Notes (Signed)
Pharmacy Antibiotic Note  Jason [name] is a 57 y.o. male on day #7 of Vancomycin for cellulitis.  He is new to dialysis. He was given a vancomycin load on 1/6 and started HD on 1/8. He has not had a full HD session yet (went 1/8, 1/9 and 1/10- all with BFRs 250-345mL/min for varying lengths of treatment).  Random Vancomycin level 1/10 was 21 mcg/ml. Patient then went to HD for only 1.5h with BFR 319mL/min which would only remove ~12.5% total of vancomycin. Patient received vancomycin 1g after this abbreviated session. Pre-HD vancomycin level estimated to be ~59mcg/mL 1/11 and no vancomycin given after HD 1/11.   HD again 1/12 at BFR 400 and entering 4th hour - will redose today  .  Plan: Vancomcycin 1gm x1 after HD session today 1/12  Height: 6\' 2"  (188 cm) Weight: 218 lb 11.1 oz (99.2 kg) IBW/kg (Calculated) : 82.2  Temp (24hrs), Avg:98.5 F (36.9 C), Min:97.9 F (36.6 C), Max:99.4 F (37.4 C)  Recent Labs  Lab 01/17/17 0531 01/19/17 0255 01/20/17 0214 01/21/17 0305 01/22/17 1324  WBC 17.6* 11.3* 9.5 12.7* 10.3  CREATININE 6.50* 5.47* 4.96* 5.12* 4.69*  VANCORANDOM  --   --  21  --   --      Allergies  Allergen Reactions  . Lisinopril Cough    Antimicrobials this admission:  Vancomycin 1/6>>  Dose adjustments this admission: 1/10 VR = 51mcg/mL (pre-HD)  Microbiology results: 1/6 BCx: ngtd 1/7 MRSA PCR: neg  Bonnita Nasuti Pharm.D. CPP, BCPS Clinical Pharmacist [phone] 01/22/2017 4:01 PM

## 2017-01-22 NOTE — Progress Notes (Signed)
[facility]                                                                              Patient Demographics  Jason [name], is a 57 y.o. male, DOB - 08/01/1960, UXL:[id number]  Admit date - 01/16/2017   Admitting Physician A Melven Sartorius., MD  Outpatient Primary MD for the patient is Wandra Mannan, NP  Outpatient specialists:   LOS - 6  days   Medical records reviewed and are as summarized below:    Chief Complaint  Patient presents with  . Leg Swelling       Brief summary   Jason [name] a 57 y.o.malewith medical history significant ofhypertension, advanced chronic kidney disease lost to follow up, anemia, inguinal hernia presenting with worsening shortness of breath, lower extremity edema, and altered mental status.in the ED, hypertensive emergency, fluid overload with worsening renal function, heart failure, acute encephalopathy and bilateral chronic venous stasis with stasis dermatitis. New ESRD started dialysis 01/19/17. Post R brachial cephalic fistula creation 01/20/17. Follow up with vascular surgery in 6 weeks . Do not stick fistula for at least 12 weeks. New TDC placed on 01/20/17. HD spot [facility].  No acute events overnight. Seen and examined at dialysis unit. Denies any dyspnea or chest pain. Persistent wiping from lower extremity wounds/LE chronic venous stasis.    Assessment & Plan    Principal Problem:    Acute hypoxic respiratory failure secondary to acute on chronic HFREF 35-40%, flash pulmonary edema, hypervolemia due to new ESRD -2D echo showed EF of 35-40% with moderate diffuse hypokinesis, grade 2 diastolic dysfunction -Cardiology following -Hemodialysis helping with peripheral edema and CHF exacerbation  Hypertensive emergency with flash pulmonary edema, improving on HD - BP 224/171 at the time of admission  - BP currently improving - continue amlodipine, Coreg, hydralazine - cardiology added clonidine 0.1 mg  daily - will continue to improve on hemodialysis  - continue dialysis to improve BP and fluid status  Active Problems:   Acute kidney injury ([place]) on advanced chronic kidney disease, stage V, progressed to new ESRD POD # 2 post right brachial cephalic fistula creation (01/20/17) -Nephrology following -Vascular surgery consulted for permanent access -HD spot [facility] -HD today 01/22/17   Elevated troponin in the setting of hypertensive emergency -Troponin peaked at 0.43 and trended down suspect due to ESRD and demand ischemia -2D echo with EF of 35-40%  (01/17/17) with moderate diffuse hypokinesis  -Cardiology following -Repeat echo in 3 months, if LVEF not improved cardiology will assess for ischemia  Bilateral chronic venous stasis with stasis dermatitis with suspected super imposed cellulitis -CT with severe soft tissue edema -Wound care following, dressings addressed -Continue IV vancomycin day # 7. Managed by pharmacy -CBC, BMET pending, blood cultures negative in 2 days  Left knee pain, resolved -Rule out acute gout, placed on pain control -ESR normal -CRP, uric acid elevated in the setting of ESRD  Medical non compliance, improving -Non compliance with medical management is improving  -Psychiatry consulted and determined patient has capacity.  Code Status: Full  DVT Prophylaxis: Heparin sq TID Family Communication: Twin daughters  in his room while he was at the dialysis unit. All questions answered to their satisfaction. Disposition Plan: will stay another midnight for IV antibiotics, to continue hemodialysis and to continue blood pressure control.  Time Spent in minutes  35 minutes  Procedures:  2D echo: Systolic function was moderately reduced.The estimated ejection fraction was in the range of 35% to 40%.Moderate diffuse hypokinesis. Features are consistent with a pseudonormal left ventricular filling pattern, with concomitant abnormal relaxation and  increased filling pressure (grade 2 diastolic dysfunction)  ABI: Normal   Consultants:   Nephrology Vascular surgery Interventional radiology  Antimicrobials:   IV vancomycin day# 7   Medications  Scheduled Meds: . amLODipine  10 mg Oral Daily  . calcium acetate  667 mg Oral TID WC  . carvedilol  25 mg Oral BID WC  . cloNIDine  0.1 mg Oral Daily  . feeding supplement (PRO-STAT SUGAR FREE 64)  30 mL Oral BID  . hydrALAZINE  100 mg Oral Q8H  . isosorbide mononitrate  60 mg Oral Daily   Continuous Infusions: . sodium chloride Stopped (01/20/17 1508)   PRN Meds:.acetaminophen, heparin, HYDROmorphone (DILAUDID) injection, labetalol, oxyCODONE-acetaminophen   Antibiotics   Anti-infectives (From admission, onward)   Start     Dose/Rate Route Frequency Ordered Stop   01/21/17 0600  cefUROXime (ZINACEF) 1.5 g in dextrose 5 % 50 mL IVPB  Status:  Discontinued     1.5 g 100 mL/hr over 30 Minutes Intravenous To [facility]Stay [facility] 01/20/17 0749 01/20/17 1208   01/20/17 1900  vancomycin (VANCOCIN) IVPB 1000 mg/200 mL premix     1,000 mg 200 mL/hr over 60 Minutes Intravenous  Once 01/20/17 1736 01/20/17 2129   01/20/17 1227  dextrose 5 % with cefUROXime (ZINACEF) ADS Med    Comments:  Jason [name]   : cabinet override      01/20/17 1227 01/20/17 1338   01/19/17 1600  vancomycin (VANCOCIN) IVPB 1000 mg/200 mL premix     1,000 mg 200 mL/hr over 60 Minutes Intravenous To Hemodialysis 01/19/17 1520 01/19/17 1825   01/16/17 1800  vancomycin (VANCOCIN) 2,000 mg in sodium chloride 0.9 % 500 mL IVPB     2,000 mg 250 mL/hr over 120 Minutes Intravenous  Once 01/16/17 1734 01/16/17 2344   01/16/17 1730  vancomycin (VANCOCIN) IVPB 1000 mg/200 mL premix  Status:  Discontinued     1,000 mg 200 mL/hr over 60 Minutes Intravenous  Once 01/16/17 1720 01/16/17 1734       Objective:   Vitals:   01/21/17 1939 01/21/17 2224 01/21/17 2328 01/22/17 0411  BP: 126/84 130/89 (!) 141/84 (!)  147/92  Pulse: 73  80 84  Resp: 18  (!) 24 (!) 28  Temp: 98.3 F (36.8 C)  98.5 F (36.9 C) 99 F (37.2 C)  TempSrc: Oral  Oral Oral  SpO2: 100%  100% 100%  Weight:      Height:        Intake/Output Summary (Last 24 hours) at 01/22/2017 0746 Last data filed at 01/21/2017 1617 Gross per 24 hour  Intake 480 ml  Output 2325 ml  Net -1845 ml     Wt Readings from Last 3 Encounters:  01/21/17 98.2 kg (216 lb 7.9 oz)  04/04/16 93 kg (205 lb)  02/26/15 95.3 kg (210 lb)     Exam  General: Alert and oriented x 3, NAD  Eyes: pupils are round and reactive to light  HEENT:  Mucous membranes are dry no erythema or exudates  Cardiovascular: S1 S2 clear, RRR   Respiratory: Bibasilar crackles  Gastrointestinal: Soft, nontender, nondistended, + bowel sounds  Ext: bilateral lower extremity edema up to tights with drainage to bilateral lower extremity  Neuro: no new deficits  Musculoskeletal: No digital cyanosis, clubbing. 3+LE edema with oozing bilaterally.  Skin: Erythema, drainage on b/l LE  Psych: Normal affect and demeanor, alert and oriented x3    Data Reviewed:  I have personally reviewed following labs and imaging studies  Micro Results Recent Results (from the past 240 hour(s))  Culture, blood (routine x 2)     Status: None (Preliminary result)   Collection Time: 01/16/17  5:11 PM  Result Value Ref Range Status   Specimen Description BLOOD RIGHT ANTECUBITAL  Final   Special Requests   Final    BOTTLES DRAWN AEROBIC AND ANAEROBIC Blood Culture adequate volume   Culture   Final    NO GROWTH 4 DAYS Performed at [facility], [address], [place], [place] [address]    Report Status PENDING  Incomplete  Culture, blood (routine x 2)     Status: None (Preliminary result)   Collection Time: 01/16/17  7:44 PM  Result Value Ref Range Status   Specimen Description BLOOD RIGHT ANTECUBITAL  Final   Special Requests   Final    BOTTLES DRAWN AEROBIC AND  ANAEROBIC Blood Culture adequate volume   Culture   Final    NO GROWTH 4 DAYS Performed at [facility], [place] [address], [place], [place] [address]    Report Status PENDING  Incomplete  MRSA PCR Screening     Status: None   Collection Time: 01/17/17  9:31 PM  Result Value Ref Range Status   MRSA by PCR NEGATIVE NEGATIVE Final    Comment:        The GeneXpert MRSA Assay (FDA approved for NASAL specimens only), is one component of a comprehensive MRSA colonization surveillance program. It is not intended to diagnose MRSA infection nor to guide or monitor treatment for MRSA infections.     Radiology Reports Dg Chest 2 View  Result Date: 01/16/2017 CLINICAL DATA:  Shortness of breath, BILATERAL lower extremity edema with weeping wounds, on oxygen, history hypertension, former smoker EXAM: CHEST  2 VIEW COMPARISON:  Non FINDINGS: Enlargement of cardiac silhouette with pulmonary vascular congestion. Rotated to the RIGHT. Subsegmental atelectasis LEFT mid lung and at both bases greater on LEFT. Minimal LEFT pleural effusion. No definite acute infiltrate, pleural effusion or pneumothorax. IMPRESSION: Enlargement of cardiac silhouette with vascular congestion. Scattered atelectasis greater on LEFT with minimal LEFT pleural effusion. Electronically Signed   By: Lavonia Dana M.D.   On: 01/16/2017 13:09   Ct Head Wo Contrast  Result Date: 01/16/2017 CLINICAL DATA:  Headache hypertension EXAM: CT HEAD WITHOUT CONTRAST TECHNIQUE: Contiguous axial images were obtained from the base of the skull through the vertex without intravenous contrast. COMPARISON:  None. FINDINGS: Motion degraded images limit assessment. Brain: No evidence of acute large vascular territory infarction, hemorrhage, hydrocephalus, extra-axial collection or mass lesion/mass effect. No intraventricular hemorrhage. No effacement of the basal cisterns or fourth ventricle. Hyperdensity along the falx likely related to falcine  calcifications, without eccentric or focal thickening to suggest hemorrhage. Minimal periventricular white matter hypodensities consistent with small vessel ischemic disease. Vascular: No hyperdense vessel or unexpected calcification. Skull: Negative for fracture or focal lesion. Sinuses/Orbits: No acute finding. Other: None IMPRESSION: Limited by motion artifacts. Minimal small vessel ischemic disease. No acute intracranial  abnormality. Electronically Signed   By: Ashley Royalty M.D.   On: 01/16/2017 18:09   US Renal  Result Date: 01/16/2017 CLINICAL DATA:  Acute renal failure.  History of hypertension. EXAM: RENAL / URINARY TRACT ULTRASOUND COMPLETE COMPARISON:  01/29/2015 FINDINGS: Right Kidney: Length: 9.9 cm. Echogenic renal parenchyma. No focal mass or hydronephrosis. Left Kidney: Length: 9.2 cm. Echogenic renal parenchyma. No focal mass or hydronephrosis. Bladder: Small amount of debris is noted within the bladder. Additional: Bilateral pleural effusions are noted. IMPRESSION: 1. Echogenic renal parenchyma bilaterally, compatible with the history of renal failure. No hydronephrosis. 2. Bilateral pleural effusions. Electronically Signed   By: Nolon Nations M.D.   On: 01/16/2017 18:47   Ct Tibia Fibula Left Wo Contrast  Result Date: 01/16/2017 CLINICAL DATA:  Cellulitis.  Evaluate for osteomyelitis. EXAM: CT OF THE LOWER LEFT EXTREMITY WITHOUT CONTRAST TECHNIQUE: Multidetector CT imaging of the lower left extremity was performed according to the standard protocol. COMPARISON:  None. FINDINGS: Bones/Joint/Cartilage No fracture or dislocation. Normal alignment. No joint effusion. Moderate medial femorotibial compartment joint space narrowing with marginal osteophytes and subchondral sclerosis. Mild lateral femorotibial compartment joint space narrowing with small marginal osteophytes. Small patellofemoral compartment marginal osteophytes. Subchondral cystic changes in the medial corner of the talar dome.  Small anterior tibial plafond marginal osteophyte. No aggressive osseous lesion. No periosteal reaction or bone destruction. Ligaments Ligaments are suboptimally evaluated by CT. Muscles and Tendons Muscles are normal.  No muscle atrophy. Soft tissue Severe soft tissue edema within the subcutaneous fat of the left lower leg. Severe soft tissue edema within the subcutaneous fat of the visualized portions of the right lower leg. No soft tissue mass. IMPRESSION: 1. Severe soft tissue edema within the subcutaneous fat of the left lower leg. Severe soft tissue edema within the subcutaneous fat of the visualized portions of the right lower leg. Differential considerations include severe cellulitis versus venous stasis. Electronically Signed   By: Kathreen Devoid   On: 01/16/2017 17:32   Ir Fluoro Guide Cv Line Right  Result Date: 01/18/2017 CLINICAL DATA:  Renal failure, needs venous access for hemodialysis EXAM: EXAM RIGHT IJ CATHETER PLACEMENT UNDER ULTRASOUND AND FLUOROSCOPIC GUIDANCE TECHNIQUE: The procedure, risks (including but not limited to bleeding, infection, organ damage, pneumothorax), benefits, and alternatives were explained to the patient. Questions regarding the procedure were encouraged and answered. The patient understands and consents to the procedure. Patency of the right IJ vein was confirmed with ultrasound with image documentation. An appropriate skin site was determined. Skin site was marked. Region was prepped using maximum barrier technique including cap and mask, sterile gown, sterile gloves, large sterile sheet, and Chlorhexidine as cutaneous antisepsis. The region was infiltrated locally with 1% lidocaine. Under real-time ultrasound guidance, the right IJ vein was accessed with a 21 gauge needle; the needle tip within the vein was confirmed with ultrasound image documentation. The needle exchanged over a 018 guidewire for vascular dilator which allowed advancement of a 20 cm Mahurkar catheter.  This was positioned with the tip at the cavoatrial junction. Spot chest radiograph shows good positioning and no pneumothorax. Catheter was flushed and sutured externally with 0-Prolene sutures. Patient tolerated the procedure well. FLUOROSCOPY TIME:  Less than 6 seconds COMPLICATIONS: COMPLICATIONS none IMPRESSION: 1. Technically successful right IJ Mahurkar catheter placement. Electronically Signed   By: Lucrezia Europe M.D.   On: 01/18/2017 12:53   Ir US Guide Vasc Access Right  Result Date: 01/18/2017 CLINICAL DATA:  Renal failure, needs venous access for  hemodialysis EXAM: EXAM RIGHT IJ CATHETER PLACEMENT UNDER ULTRASOUND AND FLUOROSCOPIC GUIDANCE TECHNIQUE: The procedure, risks (including but not limited to bleeding, infection, organ damage, pneumothorax), benefits, and alternatives were explained to the patient. Questions regarding the procedure were encouraged and answered. The patient understands and consents to the procedure. Patency of the right IJ vein was confirmed with ultrasound with image documentation. An appropriate skin site was determined. Skin site was marked. Region was prepped using maximum barrier technique including cap and mask, sterile gown, sterile gloves, large sterile sheet, and Chlorhexidine as cutaneous antisepsis. The region was infiltrated locally with 1% lidocaine. Under real-time ultrasound guidance, the right IJ vein was accessed with a 21 gauge needle; the needle tip within the vein was confirmed with ultrasound image documentation. The needle exchanged over a 018 guidewire for vascular dilator which allowed advancement of a 20 cm Mahurkar catheter. This was positioned with the tip at the cavoatrial junction. Spot chest radiograph shows good positioning and no pneumothorax. Catheter was flushed and sutured externally with 0-Prolene sutures. Patient tolerated the procedure well. FLUOROSCOPY TIME:  Less than 6 seconds COMPLICATIONS: COMPLICATIONS none IMPRESSION: 1. Technically  successful right IJ Mahurkar catheter placement. Electronically Signed   By: Lucrezia Europe M.D.   On: 01/18/2017 12:53   Dg Chest Port 1 View  Result Date: 01/20/2017 CLINICAL DATA:  Status post dialysis catheter placement EXAM: PORTABLE CHEST 1 VIEW COMPARISON:  01/16/2017 FINDINGS: Cardiac shadow remains enlarged. New dialysis catheter is noted in satisfactory position. The overall inspiratory effort is poor although no pneumothorax is seen. Mild vascular congestion is again noted. Left basilar atelectatic changes are seen. IMPRESSION: No pneumothorax following dialysis catheter placement. Mild left basilar atelectasis. Electronically Signed   By: Inez Catalina M.D.   On: 01/20/2017 15:28    Lab Data:  CBC: Recent Labs  Lab 01/16/17 1249 01/16/17 1316 01/17/17 0531 01/19/17 0255 01/20/17 0214 01/21/17 0305  WBC 6.0  --  17.6* 11.3* 9.5 12.7*  NEUTROABS 5.1  --   --   --   --   --   HGB 14.1 14.3 14.0 11.8* 11.1* 11.4*  HCT 39.8 42.0 40.1 35.4* 33.3* 35.3*  MCV 85.4  --  85.5 84.9 84.5 86.9  PLT 370  --  362 290 272 038   Basic Metabolic Panel: Recent Labs  Lab 01/16/17 1249 01/16/17 1314 01/16/17 1316 01/17/17 0531 01/18/17 1650 01/19/17 0255 01/20/17 0214 01/21/17 0305  NA 135  --  136 135  --  136 137 136  K 5.4*  --  5.3* 5.6*  --  4.3 3.8 4.8  CL 102  --  102 101  --  102 103 100*  CO2 21*  --   --  22  --  _0 GLUCOSE 105*  --  101* 115*  --  92 117* 131*  BUN 118*  --  94* 118*  --  100* 83* 81*  CREATININE 6.98*  --  7.00* 6.50*  --  5.47* 4.96* 5.12*  CALCIUM 8.8*  --   --  8.3*  --  7.6* 7.2* 7.3*  PHOS  --  7.9*  --   --  5.9*  --   --   --    GFR: Estimated Creatinine Clearance: 19 mL/min (A) (by C-G formula based on SCr of 5.12 mg/dL (H)). Liver Function Tests: Recent Labs  Lab 01/16/17 1314 01/17/17 0531 01/20/17 0214  AST 24 22 12*  ALT 64* 56 24  ALKPHOS 48  46 45  BILITOT 1.0 0.8 0.7  PROT 6.6 6.4* 5.1*  ALBUMIN 2.8* 2.7* 1.9*   No  results for input(s): LIPASE, AMYLASE in the last 168 hours. No results for input(s): AMMONIA in the last 168 hours. Coagulation Profile: No results for input(s): INR, PROTIME in the last 168 hours. Cardiac Enzymes: Recent Labs  Lab 01/16/17 1944 01/17/17 0117 01/17/17 0531  TROPONINI 0.37* 0.30* 0.29*   BNP (last 3 results) No results for input(s): PROBNP in the last 8760 hours. HbA1C: No results for input(s): HGBA1C in the last 72 hours. CBG: Recent Labs  Lab 01/19/17 0856 01/20/17 0914  GLUCAP 99 87   Lipid Profile: No results for input(s): CHOL, HDL, LDLCALC, TRIG, CHOLHDL, LDLDIRECT in the last 72 hours. Thyroid Function Tests: No results for input(s): TSH, T4TOTAL, FREET4, T3FREE, THYROIDAB in the last 72 hours. Anemia Panel: No results for input(s): VITAMINB12, FOLATE, FERRITIN, TIBC, IRON, RETICCTPCT in the last 72 hours. Urine analysis:    Component Value Date/Time   COLORURINE YELLOW 01/16/2017 1420   APPEARANCEUR CLEAR 01/16/2017 1420   LABSPEC 1.013 01/16/2017 1420   PHURINE 5.0 01/16/2017 1420   GLUCOSEU NEGATIVE 01/16/2017 1420   HGBUR SMALL (A) 01/16/2017 1420   BILIRUBINUR NEGATIVE 01/16/2017 1420   KETONESUR NEGATIVE 01/16/2017 1420   PROTEINUR 100 (A) 01/16/2017 1420   UROBILINOGEN 0.2 02/26/2015 1028   NITRITE NEGATIVE 01/16/2017 1420   LEUKOCYTESUR NEGATIVE 01/16/2017 1420     Kayleen Memos M.D. [facility] 01/22/2017, 7:46 AM  Pager: [phone] Between 7am to 7pm - call Pager - [phone]  After 7pm go to [email or web address] - password [email or web address]  Call night coverage person covering after 7pm

## 2017-01-22 NOTE — Progress Notes (Signed)
[facility] ROUNDING NOTE   Subjective:   CKD stage 5 Presents to ER with volume overload . Started  dialysis Will need  CLIP  Discussed coordinator  Dialysis scheduled [place] [facility] shift   Much improved with dialysis mentally seems clearer  Continues on oral antihypertensives and appreciate the assistance of Dr [name]  Will plan dialysis today with continued removal of fluid  Weight has decreased to 216 lbs from 236 lbs      Objective:  Vital signs in last 24 hours:  Temp:  [97.4 F (36.3 C)-99 F (37.2 C)] 99 F (37.2 C) (01/12 0411) Pulse Rate:  [68-133] 82 (01/12 0803) Resp:  [17-28] 19 (01/12 0803) BP: (101-161)/(67-111) 140/92 (01/12 0803) SpO2:  [98 %-100 %] 99 % (01/12 0803) Weight:  [216 lb 7.9 oz (98.2 kg)-220 lb 14.4 oz (100.2 kg)] 216 lb 7.9 oz (98.2 kg) (01/11 1315)  Weight change:  Filed Weights   01/19/17 1650 01/21/17 1032 01/21/17 1315  Weight: 219 lb 12.8 oz (99.7 kg) 220 lb 14.4 oz (100.2 kg) 216 lb 7.9 oz (98.2 kg)    Intake/Output: I/O last 3 completed shifts: In: 1156 [P.O.:956; IV Piggyback:200] Out: 2925 [Urine:925; Other:2000]   Intake/Output this shift:  Total I/O In: 240 [P.O.:240] Out: -   CVS- RRR RS- CTA ABD- BS present soft non-distended EXT- lower extremity edema wrapped legs     Basic Metabolic Panel: Recent Labs  Lab 01/16/17 1249 01/16/17 1314 01/16/17 1316 01/17/17 0531 01/18/17 1650 01/19/17 0255 01/20/17 0214 01/21/17 0305  NA 135  --  136 135  --  136 137 136  K 5.4*  --  5.3* 5.6*  --  4.3 3.8 4.8  CL 102  --  102 101  --  102 103 100*  CO2 21*  --   --  22  --  22 25 27   GLUCOSE 105*  --  101* 115*  --  92 117* 131*  BUN 118*  --  94* 118*  --  100* 83* 81*  CREATININE 6.98*  --  7.00* 6.50*  --  5.47* 4.96* 5.12*  CALCIUM 8.8*  --   --  8.3*  --  7.6* 7.2* 7.3*  PHOS  --  7.9*  --   --  5.9*  --   --   --     Liver Function Tests: Recent Labs  Lab 01/16/17 1314  01/17/17 0531 01/20/17 0214  AST 24 22 12*  ALT 64* 56 24  ALKPHOS 48 46 45  BILITOT 1.0 0.8 0.7  PROT 6.6 6.4* 5.1*  ALBUMIN 2.8* 2.7* 1.9*   No results for input(s): LIPASE, AMYLASE in the last 168 hours. No results for input(s): AMMONIA in the last 168 hours.  CBC: Recent Labs  Lab 01/16/17 1249 01/16/17 1316 01/17/17 0531 01/19/17 0255 01/20/17 0214 01/21/17 0305  WBC 6.0  --  17.6* 11.3* 9.5 12.7*  NEUTROABS 5.1  --   --   --   --   --   HGB 14.1 14.3 14.0 11.8* 11.1* 11.4*  HCT 39.8 42.0 40.1 35.4* 33.3* 35.3*  MCV 85.4  --  85.5 84.9 84.5 86.9  PLT 370  --  362 290 272 254    Cardiac Enzymes: Recent Labs  Lab 01/16/17 1944 01/17/17 0117 01/17/17 0531  TROPONINI 0.37* 0.30* 0.29*    BNP: Invalid input(s): POCBNP  CBG: Recent Labs  Lab 01/19/17 0856 01/20/17 0914  GLUCAP 99 87    Microbiology: Results  for orders placed or performed during the hospital encounter of 01/16/17  Culture, blood (routine x 2)     Status: None (Preliminary result)   Collection Time: 01/16/17  5:11 PM  Result Value Ref Range Status   Specimen Description BLOOD RIGHT ANTECUBITAL  Final   Special Requests   Final    BOTTLES DRAWN AEROBIC AND ANAEROBIC Blood Culture adequate volume   Culture   Final    NO GROWTH 4 DAYS Performed at [facility], [place] [address] [place], [place] [address]    Report Status PENDING  Incomplete  Culture, blood (routine x 2)     Status: None (Preliminary result)   Collection Time: 01/16/17  7:44 PM  Result Value Ref Range Status   Specimen Description BLOOD RIGHT ANTECUBITAL  Final   Special Requests   Final    BOTTLES DRAWN AEROBIC AND ANAEROBIC Blood Culture adequate volume   Culture   Final    NO GROWTH 4 DAYS Performed at [facility], [place] [address], [place], [place] [address]    Report Status PENDING  Incomplete  MRSA PCR Screening     Status: None   Collection Time: 01/17/17  9:31 PM  Result Value Ref Range Status    MRSA by PCR NEGATIVE NEGATIVE Final    Comment:        The GeneXpert MRSA Assay (FDA approved for NASAL specimens only), is one component of a comprehensive MRSA colonization surveillance program. It is not intended to diagnose MRSA infection nor to guide or monitor treatment for MRSA infections.     Coagulation Studies: No results for input(s): LABPROT, INR in the last 72 hours.  Urinalysis: No results for input(s): COLORURINE, LABSPEC, PHURINE, GLUCOSEU, HGBUR, BILIRUBINUR, KETONESUR, PROTEINUR, UROBILINOGEN, NITRITE, LEUKOCYTESUR in the last 72 hours.  Invalid input(s): APPERANCEUR    Imaging: Dg Chest Port 1 View  Result Date: 01/20/2017 CLINICAL DATA:  Status post dialysis catheter placement EXAM: PORTABLE CHEST 1 VIEW COMPARISON:  01/16/2017 FINDINGS: Cardiac shadow remains enlarged. New dialysis catheter is noted in satisfactory position. The overall inspiratory effort is poor although no pneumothorax is seen. Mild vascular congestion is again noted. Left basilar atelectatic changes are seen. IMPRESSION: No pneumothorax following dialysis catheter placement. Mild left basilar atelectasis. Electronically Signed   By: Inez Catalina M.D.   On: 01/20/2017 15:28     Medications:   . sodium chloride Stopped (01/20/17 1508)   . amLODipine  10 mg Oral Daily  . calcium acetate  667 mg Oral TID WC  . carvedilol  25 mg Oral BID WC  . cloNIDine  0.1 mg Oral Daily  . feeding supplement (PRO-STAT SUGAR FREE 64)  30 mL Oral BID  . hydrALAZINE  100 mg Oral Q8H  . isosorbide mononitrate  60 mg Oral Daily   acetaminophen, heparin, HYDROmorphone (DILAUDID) injection, labetalol, oxyCODONE-acetaminophen  Assessment/ Plan:  1. CKD - lost to f/u. Has progressive CKD, now ESRD Started HD   Appreciate Dr Scot Dock assisting with permanent access 2. Severe HTN - improved with dialysis and volume challenged and will continue oral antihypertensives for now -   ordered for dialysis and fluid  removal   3. Anemia Hb 11 4. Bones Check Phos 5.9  and PTH Pending and Calcium 8.6  Start             calcium acetate 667 mg 1 with meals   Plan dialysis treatment # 5 today      LOS: 6  Adream Parzych W @TODAY @9 :51 AM

## 2017-01-23 DIAGNOSIS — L03119 Cellulitis of unspecified part of limb: Secondary | ICD-10-CM

## 2017-01-23 MED ORDER — DARBEPOETIN ALFA 60 MCG/0.3ML IJ SOSY
60.0000 ug | PREFILLED_SYRINGE | INTRAMUSCULAR | Status: DC
  Administered 2017-01-25: 60 ug via INTRAVENOUS
  Filled 2017-01-23: qty 0.3

## 2017-01-23 MED ORDER — HEPARIN SODIUM (PORCINE) 5000 UNIT/ML IJ SOLN
5000.0000 [IU] | Freq: Three times a day (TID) | INTRAMUSCULAR | Status: DC
  Filled 2017-01-23 (×2): qty 1

## 2017-01-23 MED ORDER — NEPRO/CARBSTEADY PO LIQD
237.0000 mL | Freq: Three times a day (TID) | ORAL | Status: DC
  Administered 2017-01-23: 237 mL via ORAL
  Filled 2017-01-23 (×12): qty 237

## 2017-01-23 MED ORDER — CLONIDINE HCL 0.1 MG PO TABS
0.1000 mg | ORAL_TABLET | Freq: Two times a day (BID) | ORAL | Status: DC
  Administered 2017-01-23 – 2017-01-26 (×7): 0.1 mg via ORAL
  Filled 2017-01-23 (×6): qty 1

## 2017-01-23 NOTE — Progress Notes (Signed)
[facility]                                                                              Patient Demographics  Jason [name], is a 57 y.o. male, DOB - [birth year], GUR:[id number]  Admit date - 01/16/2017   Admitting Physician A Melven Sartorius., MD  Outpatient Primary MD for the patient is Wandra Mannan, NP  Outpatient specialists:   LOS - 7  days   Medical records reviewed and are as summarized below:    Chief Complaint  Patient presents with  . Leg Swelling       Brief summary   Jason [name] a 57 y.o.malewith medical history significant ofhypertension, advanced chronic kidney disease lost to follow up, anemia, inguinal hernia presenting with worsening shortness of breath, lower extremity edema, and altered mental status.in the ED, hypertensive emergency, fluid overload with worsening renal function, heart failure, acute encephalopathy and bilateral chronic venous stasis with stasis dermatitis. New ESRD started dialysis 01/19/17. Post R brachial cephalic fistula creation 01/20/17. Follow up with vascular surgery in 6 weeks . Do not stick fistula for at least 12 weeks. New TDC placed on 01/20/17. HD spot [place] [facility].  No acute events overnight. Patient seen and examined with no family members at his bedside. Denies dyspnea or chest pain. Persistent weeping from LE bilaterally.   Assessment & Plan    Principal Problem:    Acute hypoxic respiratory failure secondary to acute on chronic HFREF 35-40%, flash pulmonary edema, hypervolemia due to new ESRD -2D echo showed EF of 35-40% with moderate diffuse hypokinesis, grade 2 diastolic dysfunction -Cardiology following -Hemodialysis helping with peripheral edema and CHF exacerbation  Hypertensive emergency with flash pulmonary edema, improving on HD - BP 224/171 at the time of admission  - BP currently improving - continue amlodipine, Coreg, hydralazine - cardiology added clonidine 0.1 mg daily -  will continue to improve on hemodialysis  - continue dialysis to improve BP and fluid status  Active Problems:   Acute kidney injury ([place]) on advanced chronic kidney disease, stage V, progressed to new ESRD POD # 3 post right brachial cephalic fistula creation (01/20/17) -Nephrology following -Vascular surgery consulted for permanent access -HD spot [facility] -HD 01/22/17  Bilateral chronic venous stasis with stasis dermatitis with suspected super imposed cellulitis -CT with severe soft tissue edema -Wound care following, dressings addressed -Continue IV vancomycin day # 9. Managed by pharmacy -CBC, BMET pending, blood cultures negative in 2 days  Anemia of chronic disease in the setting of ESRD on HD -hg 9 from 11 -obtain iron studies to rule out iron deficiency -ferritin, iron and tibc, folate, ordered for am labs -cbc am  Severe protein calorie malnutrition -albumin 1.7 -pro stat oral supplement   Elevated troponin in the setting of hypertensive emergency -Troponin peaked at 0.43 and trended down suspect due to ESRD and demand ischemia -2D echo with EF of 35-40%  (01/17/17) with moderate diffuse hypokinesis  -Cardiology following -Repeat echo in 3 months, if LVEF not improved cardiology will assess for ischemia  Left knee pain, resolved -Rule out acute gout, placed on pain control -ESR  normal -CRP, uric acid elevated in the setting of ESRD  Medical non compliance, improving -Non compliance with medical management is improving  -Psychiatry consulted and determined patient has capacity.  Code Status: Full  DVT Prophylaxis: Heparin sq TID Family Communication: none at bedside today Disposition Plan: will stay another midnight for IV antibiotics, to continue hemodialysis and to continue blood pressure control.  Time Spent in minutes  35 minutes  Procedures:  2D echo: Systolic function was moderately reduced.The estimated ejection fraction was in the range  of 35% to 40%.Moderate diffuse hypokinesis. Features are consistent with a pseudonormal left ventricular filling pattern, with concomitant abnormal relaxation and increased filling pressure (grade 2 diastolic dysfunction)  ABI: Normal   Consultants:   Nephrology Vascular surgery Interventional radiology  Antimicrobials:   IV vancomycin day# 9   Medications  Scheduled Meds: . amLODipine  10 mg Oral Daily  . calcium acetate  667 mg Oral TID WC  . carvedilol  25 mg Oral BID WC  . cloNIDine  0.1 mg Oral Daily  . feeding supplement (PRO-STAT SUGAR FREE 64)  30 mL Oral BID  . hydrALAZINE  100 mg Oral Q8H  . isosorbide mononitrate  60 mg Oral Daily   Continuous Infusions: . sodium chloride Stopped (01/20/17 1508)  . sodium chloride    . sodium chloride     PRN Meds:.sodium chloride, sodium chloride, acetaminophen, alteplase, heparin, heparin, HYDROmorphone (DILAUDID) injection, labetalol, lidocaine (PF), lidocaine-prilocaine, oxyCODONE-acetaminophen, pentafluoroprop-tetrafluoroeth   Antibiotics   Anti-infectives (From admission, onward)   Start     Dose/Rate Route Frequency Ordered Stop   01/22/17 1700  vancomycin (VANCOCIN) IVPB 1000 mg/200 mL premix     1,000 mg 200 mL/hr over 60 Minutes Intravenous  Once 01/22/17 1554 01/23/17 0026   01/21/17 0600  cefUROXime (ZINACEF) 1.5 g in dextrose 5 % 50 mL IVPB  Status:  Discontinued     1.5 g 100 mL/hr over 30 Minutes Intravenous To [facility] 01/20/17 0749 01/20/17 1208   01/20/17 1900  vancomycin (VANCOCIN) IVPB 1000 mg/200 mL premix     1,000 mg 200 mL/hr over 60 Minutes Intravenous  Once 01/20/17 1736 01/20/17 2129   01/20/17 1227  dextrose 5 % with cefUROXime (ZINACEF) ADS Med    Comments:  Laurita Quint   : cabinet override      01/20/17 1227 01/20/17 1338   01/19/17 1600  vancomycin (VANCOCIN) IVPB 1000 mg/200 mL premix     1,000 mg 200 mL/hr over 60 Minutes Intravenous To Hemodialysis 01/19/17 1520  01/19/17 1825   01/16/17 1800  vancomycin (VANCOCIN) 2,000 mg in sodium chloride 0.9 % 500 mL IVPB     2,000 mg 250 mL/hr over 120 Minutes Intravenous  Once 01/16/17 1734 01/16/17 2344   01/16/17 1730  vancomycin (VANCOCIN) IVPB 1000 mg/200 mL premix  Status:  Discontinued     1,000 mg 200 mL/hr over 60 Minutes Intravenous  Once 01/16/17 1720 01/16/17 1734       Objective:   Vitals:   01/22/17 1927 01/22/17 2336 01/23/17 0300 01/23/17 0619  BP: 127/66 134/84 (!) 151/105 (!) 157/102  Pulse: 72 78 78   Resp: 19 17 (!) 24   Temp: 97.9 F (36.6 C) 97.9 F (36.6 C) 98.4 F (36.9 C)   TempSrc: Oral Oral Oral   SpO2: 100% 99% 94%   Weight:      Height:        Intake/Output Summary (Last 24 hours) at 01/23/2017 2774 Last data filed at  01/23/2017 0300 Gross per 24 hour  Intake 360 ml  Output 1585 ml  Net -1225 ml     Wt Readings from Last 3 Encounters:  01/22/17 97.5 kg (214 lb 15.2 oz)  04/04/16 93 kg (205 lb)  02/26/15 95.3 kg (210 lb)     Exam  General: Alert and oriented x 3, NAD  Eyes: pupils are round and reactive to light  HEENT:  Mucous membranes are dry no erythema or exudates  Cardiovascular: S1 S2 clear, RRR   Respiratory: Bibasilar crackles  Gastrointestinal: Soft, nontender, nondistended, + bowel sounds  Ext: bilateral lower extremity edema up to tights with drainage to bilateral lower extremity  Neuro: no new deficits  Musculoskeletal: No digital cyanosis, clubbing. 3+LE edema with oozing bilaterally.  Skin: Erythema, drainage on b/l LE  Psych: Normal affect and demeanor, alert and oriented x3    Data Reviewed:  I have personally reviewed following labs and imaging studies  Micro Results Recent Results (from the past 240 hour(s))  Culture, blood (routine x 2)     Status: None   Collection Time: 01/16/17  5:11 PM  Result Value Ref Range Status   Specimen Description BLOOD RIGHT ANTECUBITAL  Final   Special Requests   Final    BOTTLES  DRAWN AEROBIC AND ANAEROBIC Blood Culture adequate volume   Culture   Final    NO GROWTH 5 DAYS Performed at [facility], [address], [place], [place] [address]    Report Status 01/22/2017 FINAL  Final  Culture, blood (routine x 2)     Status: None   Collection Time: 01/16/17  7:44 PM  Result Value Ref Range Status   Specimen Description BLOOD RIGHT ANTECUBITAL  Final   Special Requests   Final    BOTTLES DRAWN AEROBIC AND ANAEROBIC Blood Culture adequate volume   Culture   Final    NO GROWTH 5 DAYS Performed at [facility], [place] [address] [place], [place] [address]    Report Status 01/22/2017 FINAL  Final  MRSA PCR Screening     Status: None   Collection Time: 01/17/17  9:31 PM  Result Value Ref Range Status   MRSA by PCR NEGATIVE NEGATIVE Final    Comment:        The GeneXpert MRSA Assay (FDA approved for NASAL specimens only), is one component of a comprehensive MRSA colonization surveillance program. It is not intended to diagnose MRSA infection nor to guide or monitor treatment for MRSA infections.     Radiology Reports Dg Chest 2 View  Result Date: 01/16/2017 CLINICAL DATA:  Shortness of breath, BILATERAL lower extremity edema with weeping wounds, on oxygen, history hypertension, former smoker EXAM: CHEST  2 VIEW COMPARISON:  Non FINDINGS: Enlargement of cardiac silhouette with pulmonary vascular congestion. Rotated to the RIGHT. Subsegmental atelectasis LEFT mid lung and at both bases greater on LEFT. Minimal LEFT pleural effusion. No definite acute infiltrate, pleural effusion or pneumothorax. IMPRESSION: Enlargement of cardiac silhouette with vascular congestion. Scattered atelectasis greater on LEFT with minimal LEFT pleural effusion. Electronically Signed   By: Lavonia Dana M.D.   On: 01/16/2017 13:09   Ct Head Wo Contrast  Result Date: 01/16/2017 CLINICAL DATA:  Headache hypertension EXAM: CT HEAD WITHOUT CONTRAST TECHNIQUE: Contiguous axial images  were obtained from the base of the skull through the vertex without intravenous contrast. COMPARISON:  None. FINDINGS: Motion degraded images limit assessment. Brain: No evidence of acute large vascular territory infarction, hemorrhage,  hydrocephalus, extra-axial collection or mass lesion/mass effect. No intraventricular hemorrhage. No effacement of the basal cisterns or fourth ventricle. Hyperdensity along the falx likely related to falcine calcifications, without eccentric or focal thickening to suggest hemorrhage. Minimal periventricular white matter hypodensities consistent with small vessel ischemic disease. Vascular: No hyperdense vessel or unexpected calcification. Skull: Negative for fracture or focal lesion. Sinuses/Orbits: No acute finding. Other: None IMPRESSION: Limited by motion artifacts. Minimal small vessel ischemic disease. No acute intracranial abnormality. Electronically Signed   By: Ashley Royalty M.D.   On: 01/16/2017 18:09   US Renal  Result Date: 01/16/2017 CLINICAL DATA:  Acute renal failure.  History of hypertension. EXAM: RENAL / URINARY TRACT ULTRASOUND COMPLETE COMPARISON:  01/29/2015 FINDINGS: Right Kidney: Length: 9.9 cm. Echogenic renal parenchyma. No focal mass or hydronephrosis. Left Kidney: Length: 9.2 cm. Echogenic renal parenchyma. No focal mass or hydronephrosis. Bladder: Small amount of debris is noted within the bladder. Additional: Bilateral pleural effusions are noted. IMPRESSION: 1. Echogenic renal parenchyma bilaterally, compatible with the history of renal failure. No hydronephrosis. 2. Bilateral pleural effusions. Electronically Signed   By: Nolon Nations M.D.   On: 01/16/2017 18:47   Ct Tibia Fibula Left Wo Contrast  Result Date: 01/16/2017 CLINICAL DATA:  Cellulitis.  Evaluate for osteomyelitis. EXAM: CT OF THE LOWER LEFT EXTREMITY WITHOUT CONTRAST TECHNIQUE: Multidetector CT imaging of the lower left extremity was performed according to the standard protocol.  COMPARISON:  None. FINDINGS: Bones/Joint/Cartilage No fracture or dislocation. Normal alignment. No joint effusion. Moderate medial femorotibial compartment joint space narrowing with marginal osteophytes and subchondral sclerosis. Mild lateral femorotibial compartment joint space narrowing with small marginal osteophytes. Small patellofemoral compartment marginal osteophytes. Subchondral cystic changes in the medial corner of the talar dome. Small anterior tibial plafond marginal osteophyte. No aggressive osseous lesion. No periosteal reaction or bone destruction. Ligaments Ligaments are suboptimally evaluated by CT. Muscles and Tendons Muscles are normal.  No muscle atrophy. Soft tissue Severe soft tissue edema within the subcutaneous fat of the left lower leg. Severe soft tissue edema within the subcutaneous fat of the visualized portions of the right lower leg. No soft tissue mass. IMPRESSION: 1. Severe soft tissue edema within the subcutaneous fat of the left lower leg. Severe soft tissue edema within the subcutaneous fat of the visualized portions of the right lower leg. Differential considerations include severe cellulitis versus venous stasis. Electronically Signed   By: Kathreen Devoid   On: 01/16/2017 17:32   Ir Fluoro Guide Cv Line Right  Result Date: 01/18/2017 CLINICAL DATA:  Renal failure, needs venous access for hemodialysis EXAM: EXAM RIGHT IJ CATHETER PLACEMENT UNDER ULTRASOUND AND FLUOROSCOPIC GUIDANCE TECHNIQUE: The procedure, risks (including but not limited to bleeding, infection, organ damage, pneumothorax), benefits, and alternatives were explained to the patient. Questions regarding the procedure were encouraged and answered. The patient understands and consents to the procedure. Patency of the right IJ vein was confirmed with ultrasound with image documentation. An appropriate skin site was determined. Skin site was marked. Region was prepped using maximum barrier technique including cap and  mask, sterile gown, sterile gloves, large sterile sheet, and Chlorhexidine as cutaneous antisepsis. The region was infiltrated locally with 1% lidocaine. Under real-time ultrasound guidance, the right IJ vein was accessed with a 21 gauge needle; the needle tip within the vein was confirmed with ultrasound image documentation. The needle exchanged over a 018 guidewire for vascular dilator which allowed advancement of a 20 cm Mahurkar catheter. This was positioned with the tip at  the cavoatrial junction. Spot chest radiograph shows good positioning and no pneumothorax. Catheter was flushed and sutured externally with 0-Prolene sutures. Patient tolerated the procedure well. FLUOROSCOPY TIME:  Less than 6 seconds COMPLICATIONS: COMPLICATIONS none IMPRESSION: 1. Technically successful right IJ Mahurkar catheter placement. Electronically Signed   By: Lucrezia Europe M.D.   On: 01/18/2017 12:53   Ir US Guide Vasc Access Right  Result Date: 01/18/2017 CLINICAL DATA:  Renal failure, needs venous access for hemodialysis EXAM: EXAM RIGHT IJ CATHETER PLACEMENT UNDER ULTRASOUND AND FLUOROSCOPIC GUIDANCE TECHNIQUE: The procedure, risks (including but not limited to bleeding, infection, organ damage, pneumothorax), benefits, and alternatives were explained to the patient. Questions regarding the procedure were encouraged and answered. The patient understands and consents to the procedure. Patency of the right IJ vein was confirmed with ultrasound with image documentation. An appropriate skin site was determined. Skin site was marked. Region was prepped using maximum barrier technique including cap and mask, sterile gown, sterile gloves, large sterile sheet, and Chlorhexidine as cutaneous antisepsis. The region was infiltrated locally with 1% lidocaine. Under real-time ultrasound guidance, the right IJ vein was accessed with a 21 gauge needle; the needle tip within the vein was confirmed with ultrasound image documentation. The  needle exchanged over a 018 guidewire for vascular dilator which allowed advancement of a 20 cm Mahurkar catheter. This was positioned with the tip at the cavoatrial junction. Spot chest radiograph shows good positioning and no pneumothorax. Catheter was flushed and sutured externally with 0-Prolene sutures. Patient tolerated the procedure well. FLUOROSCOPY TIME:  Less than 6 seconds COMPLICATIONS: COMPLICATIONS none IMPRESSION: 1. Technically successful right IJ Mahurkar catheter placement. Electronically Signed   By: Lucrezia Europe M.D.   On: 01/18/2017 12:53   Dg Chest Port 1 View  Result Date: 01/20/2017 CLINICAL DATA:  Status post dialysis catheter placement EXAM: PORTABLE CHEST 1 VIEW COMPARISON:  01/16/2017 FINDINGS: Cardiac shadow remains enlarged. New dialysis catheter is noted in satisfactory position. The overall inspiratory effort is poor although no pneumothorax is seen. Mild vascular congestion is again noted. Left basilar atelectatic changes are seen. IMPRESSION: No pneumothorax following dialysis catheter placement. Mild left basilar atelectasis. Electronically Signed   By: Inez Catalina M.D.   On: 01/20/2017 15:28    Lab Data:  CBC: Recent Labs  Lab 01/16/17 1249  01/17/17 0531 01/19/17 0255 01/20/17 0214 01/21/17 0305 01/22/17 1324  WBC 6.0  --  17.6* 11.3* 9.5 12.7* 10.3  NEUTROABS 5.1  --   --   --   --   --   --   HGB 14.1   < > 14.0 11.8* 11.1* 11.4* 9.3*  HCT 39.8   < > 40.1 35.4* 33.3* 35.3* 28.1*  MCV 85.4  --  85.5 84.9 84.5 86.9 86.2  PLT 370  --  362 290 272 254 224   < > = values in this interval not displayed.   Basic Metabolic Panel: Recent Labs  Lab 01/16/17 1314  01/17/17 0531 01/18/17 1650 01/19/17 0255 01/20/17 0214 01/21/17 0305 01/22/17 1324  NA  --    < > 135  --  136 137 136 135  K  --    < > 5.6*  --  4.3 3.8 4.8 3.9  CL  --    < > 101  --  102 103 100* 103  CO2  --   --  22  --  22 25 27 23   GLUCOSE  --    < > 115*  --  92 117* 131* 104*    BUN  --    < > 118*  --  100* 83* 81* 71*  CREATININE  --    < > 6.50*  --  5.47* 4.96* 5.12* 4.69*  CALCIUM  --   --  8.3*  --  7.6* 7.2* 7.3* 7.1*  PHOS 7.9*  --   --  5.9*  --   --   --  3.6   < > = values in this interval not displayed.   GFR: Estimated Creatinine Clearance: 20.7 mL/min (A) (by C-G formula based on SCr of 4.69 mg/dL (H)). Liver Function Tests: Recent Labs  Lab 01/16/17 1314 01/17/17 0531 01/20/17 0214 01/22/17 1324  AST 24 22 12*  --   ALT 64* 56 24  --   ALKPHOS 48 46 45  --   BILITOT 1.0 0.8 0.7  --   PROT 6.6 6.4* 5.1*  --   ALBUMIN 2.8* 2.7* 1.9* 1.7*   No results for input(s): LIPASE, AMYLASE in the last 168 hours. No results for input(s): AMMONIA in the last 168 hours. Coagulation Profile: No results for input(s): INR, PROTIME in the last 168 hours. Cardiac Enzymes: Recent Labs  Lab 01/16/17 1944 01/17/17 0117 01/17/17 0531  TROPONINI 0.37* 0.30* 0.29*   BNP (last 3 results) No results for input(s): PROBNP in the last 8760 hours. HbA1C: No results for input(s): HGBA1C in the last 72 hours. CBG: Recent Labs  Lab 01/19/17 0856 01/20/17 0914  GLUCAP 99 87   Lipid Profile: No results for input(s): CHOL, HDL, LDLCALC, TRIG, CHOLHDL, LDLDIRECT in the last 72 hours. Thyroid Function Tests: No results for input(s): TSH, T4TOTAL, FREET4, T3FREE, THYROIDAB in the last 72 hours. Anemia Panel: No results for input(s): VITAMINB12, FOLATE, FERRITIN, TIBC, IRON, RETICCTPCT in the last 72 hours. Urine analysis:    Component Value Date/Time   COLORURINE YELLOW 01/16/2017 1420   APPEARANCEUR CLEAR 01/16/2017 1420   LABSPEC 1.013 01/16/2017 1420   PHURINE 5.0 01/16/2017 1420   GLUCOSEU NEGATIVE 01/16/2017 1420   HGBUR SMALL (A) 01/16/2017 1420   BILIRUBINUR NEGATIVE 01/16/2017 1420   KETONESUR NEGATIVE 01/16/2017 1420   PROTEINUR 100 (A) 01/16/2017 1420   UROBILINOGEN 0.2 02/26/2015 1028   NITRITE NEGATIVE 01/16/2017 1420   LEUKOCYTESUR  NEGATIVE 01/16/2017 1420     Kayleen Memos M.D. [facility] 01/23/2017, 7:31 AM  Pager: [phone] Between 7am to 7pm - call Pager - [phone]  After 7pm go to [email or web address] - password [email or web address]  Call night coverage person covering after 7pm

## 2017-01-23 NOTE — Progress Notes (Signed)
Progress Note  Patient Name: Jason [name] Date of Encounter: 01/23/2017  Primary Cardiologist: Kirk Ruths, MD   Subjective   Denies dyspnea or CP  Inpatient Medications    Scheduled Meds: . amLODipine  10 mg Oral Daily  . calcium acetate  667 mg Oral TID WC  . carvedilol  25 mg Oral BID WC  . cloNIDine  0.1 mg Oral Daily  . feeding supplement (PRO-STAT SUGAR FREE 64)  30 mL Oral BID  . heparin injection (subcutaneous)  5,000 Units Subcutaneous Q8H  . hydrALAZINE  100 mg Oral Q8H  . isosorbide mononitrate  60 mg Oral Daily   Continuous Infusions: . sodium chloride Stopped (01/20/17 1508)  . sodium chloride    . sodium chloride     PRN Meds: sodium chloride, sodium chloride, acetaminophen, alteplase, heparin, heparin, HYDROmorphone (DILAUDID) injection, labetalol, lidocaine (PF), lidocaine-prilocaine, oxyCODONE-acetaminophen, pentafluoroprop-tetrafluoroeth   Vital Signs    Vitals:   01/22/17 2336 01/23/17 0300 01/23/17 0619 01/23/17 0741  BP: 134/84 (!) 151/105 (!) 157/102   Pulse: 78 78  75  Resp: 17 (!) 24    Temp: 97.9 F (36.6 C) 98.4 F (36.9 C)  98.9 F (37.2 C)  TempSrc: Oral Oral  Oral  SpO2: 99% 94%  100%  Weight:      Height:        Intake/Output Summary (Last 24 hours) at 01/23/2017 0836 Last data filed at 01/23/2017 0300 Gross per 24 hour  Intake 360 ml  Output 1585 ml  Net -1225 ml   Filed Weights   01/21/17 1315 01/22/17 1300 01/22/17 1713  Weight: 216 lb 7.9 oz (98.2 kg) 218 lb 11.1 oz (99.2 kg) 214 lb 15.2 oz (97.5 kg)    Telemetry    Sinus with PAT- Personally Reviewed   Physical Exam   GEN: WD, NAD, eating breakfast Neck: supple Cardiac: RRR Respiratory: CTA GI: soft, not tender, not distended MS: 2 + edema (L>R); wrapped Neuro:  no focal findings   Labs    Chemistry Recent Labs  Lab 01/16/17 1314  01/17/17 0531  01/20/17 0214 01/21/17 0305 01/22/17 1324  NA  --    < > 135   < > 137 136 135  K  --    < > 5.6*    < > 3.8 4.8 3.9  CL  --    < > 101   < > 103 100* 103  CO2  --   --  22   < > 25 27 23   GLUCOSE  --    < > 115*   < > 117* 131* 104*  BUN  --    < > 118*   < > 83* 81* 71*  CREATININE  --    < > 6.50*   < > 4.96* 5.12* 4.69*  CALCIUM  --   --  8.3*   < > 7.2* 7.3* 7.1*  PROT 6.6  --  6.4*  --  5.1*  --   --   ALBUMIN 2.8*  --  2.7*  --  1.9*  --  1.7*  AST 24  --  22  --  12*  --   --   ALT 64*  --  56  --  24  --   --   ALKPHOS 48  --  46  --  45  --   --   BILITOT 1.0  --  0.8  --  0.7  --   --  GFRNONAA  --   --  9*   < > 12* 12* 13*  GFRAA  --   --  10*   < > 14* 13* 15*  ANIONGAP  --   --  12   < > 9 9 9    < > = values in this interval not displayed.     Hematology Recent Labs  Lab 01/20/17 0214 01/21/17 0305 01/22/17 1324  WBC 9.5 12.7* 10.3  RBC 3.94* 4.06* 3.26*  HGB 11.1* 11.4* 9.3*  HCT 33.3* 35.3* 28.1*  MCV 84.5 86.9 86.2  MCH 28.2 28.1 28.5  MCHC 33.3 32.3 33.1  RDW 13.1 13.4 13.4  PLT 272 254 224    Cardiac Enzymes Recent Labs  Lab 01/16/17 1944 01/17/17 0117 01/17/17 0531  TROPONINI 0.37* 0.30* 0.29*    Recent Labs  Lab 01/16/17 1314  TROPIPOC 0.43*     BNP Recent Labs  Lab 01/16/17 1249  BNP >4,500.0*      Patient Profile     57 year old male with past medical history of hypertension, medical noncompliance and chronic kidney disease for evaluation of cardiomyopathy and elevated troponin.  Was admitted on January 6 with complaints of dyspnea on exertion, orthopnea, weight gain and lower extremity edema.  Patient's initial blood pressure was 224/178. Echocardiogram was personally reviewed.  Ejection fraction appears to be 15-20%.  There is moderate mitral and tricuspid regurgitation. Troponin I 0.43, 0.37, 0.30 and 0.29.     Assessment & Plan    1 cardiomyopathy-likely hypertensive mediated.  Continue present dose of carvedilol, amlodipine and hydralazine/nitrates.  No ACEI/ARB given his renal insufficiency. Plan to continue medications  for 3 months and then repeat echocardiogram.  Hopefully LV function will improve once blood pressure is controlled.  If not would need ischemia evaluation at that time.  2 elevated troponin-no chest pain; no trend; no further ischemia eval.  3 hypertensive emergency-blood pressure improved but remains mildly elevated.  Continue carvedilol, amlodipine, hydralazine/nitrates and clonidine (increase to 0.1 mg BID). Will need to watch BP closely following DC as he may develop hypotension as more volume is removed with dialysis.  4 acute systolic/diastolic congestive heart failure-Volume management per dialysis.  5 end-stage renal disease-per nephrology  6 lower extremity cellulitis-Antibiotics per IM.    For questions or updates, please contact [place] Please consult [email or web address] for contact info under Cardiology/STEMI.      Signed, Kirk Ruths, MD  01/23/2017, 8:36 AM

## 2017-01-23 NOTE — Progress Notes (Signed)
Patient refused CPAP tonight. There Isn't a machine in the room at this time. RN aware.

## 2017-01-23 NOTE — Progress Notes (Signed)
Pt refusing SQ heparin

## 2017-01-23 NOTE — Progress Notes (Signed)
[facility] ROUNDING NOTE   Subjective:   CKD stage 5 Presents to ER with volume overload . Started  dialysis Will need  CLIP  Discussed coordinator  Dialysis scheduled [place] [facility] shift   Much improved with dialysis mentally seems clearer  Continues on oral antihypertensives and appreciate the assistance of Dr [name]  Will plan dialysis in Tuesday  with continued removal of fluid  Weight has decreased to 214 lbs       Objective:  Vital signs in last 24 hours:  Temp:  [97.9 F (36.6 C)-99.4 F (37.4 C)] 98.9 F (37.2 C) (01/13 0741) Pulse Rate:  [57-78] 75 (01/13 0741) Resp:  [17-29] 24 (01/13 0300) BP: (117-159)/(66-113) 157/102 (01/13 0619) SpO2:  [94 %-100 %] 100 % (01/13 0741) Weight:  [214 lb 15.2 oz (97.5 kg)-218 lb 11.1 oz (99.2 kg)] 214 lb 15.2 oz (97.5 kg) (01/12 1713)  Weight change: -3.3 oz (-1 kg) Filed Weights   01/21/17 1315 01/22/17 1300 01/22/17 1713  Weight: 216 lb 7.9 oz (98.2 kg) 218 lb 11.1 oz (99.2 kg) 214 lb 15.2 oz (97.5 kg)    Intake/Output: I/O last 3 completed shifts: In: 360 [P.O.:360] Out: 1585 [Other:1585]   Intake/Output this shift:  No intake/output data recorded.  CVS- RRR RS- CTA ABD- BS present soft non-distended EXT- lower extremity edema wrapped legs     Basic Metabolic Panel: Recent Labs  Lab 01/16/17 1314  01/17/17 0531 01/18/17 1650 01/19/17 0255 01/20/17 0214 01/21/17 0305 01/22/17 1324  NA  --    < > 135  --  136 137 136 135  K  --    < > 5.6*  --  4.3 3.8 4.8 3.9  CL  --    < > 101  --  102 103 100* 103  CO2  --   --  22  --  22 25 27 23   GLUCOSE  --    < > 115*  --  92 117* 131* 104*  BUN  --    < > 118*  --  100* 83* 81* 71*  CREATININE  --    < > 6.50*  --  5.47* 4.96* 5.12* 4.69*  CALCIUM  --   --  8.3*  --  7.6* 7.2* 7.3* 7.1*  PHOS 7.9*  --   --  5.9*  --   --   --  3.6   < > = values in this interval not displayed.    Liver Function Tests: Recent Labs  Lab  01/16/17 1314 01/17/17 0531 01/20/17 0214 01/22/17 1324  AST 24 22 12*  --   ALT 64* 56 24  --   ALKPHOS 48 46 45  --   BILITOT 1.0 0.8 0.7  --   PROT 6.6 6.4* 5.1*  --   ALBUMIN 2.8* 2.7* 1.9* 1.7*   No results for input(s): LIPASE, AMYLASE in the last 168 hours. No results for input(s): AMMONIA in the last 168 hours.  CBC: Recent Labs  Lab 01/16/17 1249  01/17/17 0531 01/19/17 0255 01/20/17 0214 01/21/17 0305 01/22/17 1324  WBC 6.0  --  17.6* 11.3* 9.5 12.7* 10.3  NEUTROABS 5.1  --   --   --   --   --   --   HGB 14.1   < > 14.0 11.8* 11.1* 11.4* 9.3*  HCT 39.8   < > 40.1 35.4* 33.3* 35.3* 28.1*  MCV 85.4  --  85.5 84.9 84.5 86.9 86.2  PLT  370  --  362 290 272 254 224   < > = values in this interval not displayed.    Cardiac Enzymes: Recent Labs  Lab 01/16/17 1944 01/17/17 0117 01/17/17 0531  TROPONINI 0.37* 0.30* 0.29*    BNP: Invalid input(s): POCBNP  CBG: Recent Labs  Lab 01/19/17 0856 01/20/17 0914  GLUCAP 99 53    Microbiology: Results for orders placed or performed during the hospital encounter of 01/16/17  Culture, blood (routine x 2)     Status: None   Collection Time: 01/16/17  5:11 PM  Result Value Ref Range Status   Specimen Description BLOOD RIGHT ANTECUBITAL  Final   Special Requests   Final    BOTTLES DRAWN AEROBIC AND ANAEROBIC Blood Culture adequate volume   Culture   Final    NO GROWTH 5 DAYS Performed at [facility], [place] [address], [place], [place] [address]    Report Status 01/22/2017 FINAL  Final  Culture, blood (routine x 2)     Status: None   Collection Time: 01/16/17  7:44 PM  Result Value Ref Range Status   Specimen Description BLOOD RIGHT ANTECUBITAL  Final   Special Requests   Final    BOTTLES DRAWN AEROBIC AND ANAEROBIC Blood Culture adequate volume   Culture   Final    NO GROWTH 5 DAYS Performed at [facility], [place] [address] [place], [place] [address]    Report Status 01/22/2017 FINAL  Final   MRSA PCR Screening     Status: None   Collection Time: 01/17/17  9:31 PM  Result Value Ref Range Status   MRSA by PCR NEGATIVE NEGATIVE Final    Comment:        The GeneXpert MRSA Assay (FDA approved for NASAL specimens only), is one component of a comprehensive MRSA colonization surveillance program. It is not intended to diagnose MRSA infection nor to guide or monitor treatment for MRSA infections.     Coagulation Studies: No results for input(s): LABPROT, INR in the last 72 hours.  Urinalysis: No results for input(s): COLORURINE, LABSPEC, PHURINE, GLUCOSEU, HGBUR, BILIRUBINUR, KETONESUR, PROTEINUR, UROBILINOGEN, NITRITE, LEUKOCYTESUR in the last 72 hours.  Invalid input(s): APPERANCEUR    Imaging: No results found.   Medications:   . sodium chloride Stopped (01/20/17 1508)  . sodium chloride    . sodium chloride     . amLODipine  10 mg Oral Daily  . calcium acetate  667 mg Oral TID WC  . carvedilol  25 mg Oral BID WC  . cloNIDine  0.1 mg Oral BID  . feeding supplement (PRO-STAT SUGAR FREE 64)  30 mL Oral BID  . heparin injection (subcutaneous)  5,000 Units Subcutaneous Q8H  . hydrALAZINE  100 mg Oral Q8H  . isosorbide mononitrate  60 mg Oral Daily   sodium chloride, sodium chloride, acetaminophen, alteplase, heparin, heparin, HYDROmorphone (DILAUDID) injection, labetalol, lidocaine (PF), lidocaine-prilocaine, oxyCODONE-acetaminophen, pentafluoroprop-tetrafluoroeth  Assessment/ Plan:  1. CKD - lost to f/u. Has progressive CKD, now ESRD Started HD   Appreciate Dr Scot Dock assisting with permanent access 2. Severe HTN - improved with dialysis and volume challenged and will continue oral antihypertensives for now -   ordered for dialysis and fluid removal   3. Anemia Hb sub 10 will check iron stores and will add darbepoietin  4. Bones   Phos 3.6  and PTH  273   and Calcium 7.1   Start  calcium acetate 667 mg 1 with meals  5. Hypoalbuminemia  Add  nepro with meals   Plan dialysis s/p 5 treatments   [facility] [place]       LOS: 7 Monque Haggar W @TODAY @8 :57 AM

## 2017-01-23 NOTE — Progress Notes (Signed)
Daughter Mia at bedside, she states she is primary caregiver. Her number is [phone]. She states pt does not have anywhere to live at this time.

## 2017-01-24 LAB — CBC
HEMATOCRIT: 27.4 % — AB (ref 39.0–52.0)
Hemoglobin: 9.4 g/dL — ABNORMAL LOW (ref 13.0–17.0)
MCH: 29.4 pg (ref 26.0–34.0)
MCHC: 34.3 g/dL (ref 30.0–36.0)
MCV: 85.6 fL (ref 78.0–100.0)
PLATELETS: 219 10*3/uL (ref 150–400)
RBC: 3.2 MIL/uL — ABNORMAL LOW (ref 4.22–5.81)
RDW: 13.7 % (ref 11.5–15.5)
WBC: 7.4 10*3/uL (ref 4.0–10.5)

## 2017-01-24 LAB — COMPREHENSIVE METABOLIC PANEL
ALT: 32 U/L (ref 17–63)
ANION GAP: 10 (ref 5–15)
AST: 28 U/L (ref 15–41)
Albumin: 1.7 g/dL — ABNORMAL LOW (ref 3.5–5.0)
Alkaline Phosphatase: 41 U/L (ref 38–126)
BILIRUBIN TOTAL: 0.6 mg/dL (ref 0.3–1.2)
BUN: 62 mg/dL — ABNORMAL HIGH (ref 6–20)
CALCIUM: 7.1 mg/dL — AB (ref 8.9–10.3)
CHLORIDE: 99 mmol/L — AB (ref 101–111)
CO2: 26 mmol/L (ref 22–32)
CREATININE: 4.79 mg/dL — AB (ref 0.61–1.24)
GFR calc Af Amer: 14 mL/min — ABNORMAL LOW (ref >60)
GFR, EST NON AFRICAN AMERICAN: 12 mL/min — AB (ref >60)
Glucose, Bld: 129 mg/dL — ABNORMAL HIGH (ref 65–99)
POTASSIUM: 4.1 mmol/L (ref 3.5–5.1)
Sodium: 135 mmol/L (ref 135–145)
Total Protein: 4.9 g/dL — ABNORMAL LOW (ref 6.5–8.1)

## 2017-01-24 NOTE — Care Management Note (Addendum)
Case Management Note  Patient Details  Name: Jason [name] MRN: [id number] Date of Birth: [birth year]  Subjective/Objective:     Pt admitted with cellulitis, hypertensive emergency, worsening RF  and SOB.                Action/Plan:  PTA from home.  ESRD not yet on outpt HD - however it has been determined that pt will now require perm HD - pt will need to be clipped.  Pt will need appt at [facility] and possibly [facility] at discharge.  CM will continue to follow for discharge needs   Expected Discharge Date:  (unknown)               Expected Discharge Plan:  Home/Self Care  In-House Referral:  Clinical Social Work  Discharge planning Services  CM Consult  Post Acute Care Choice:    Choice offered to:  Patient  DME Arranged:    DME Agency:     HH Arranged:    [place] Agency:     Status of Service:  In process, will continue to follow  If discussed at Long Length of Stay Meetings, dates discussed:    Additional Comments: 01/24/2017  CSW reviewed case with AD and will document determinations in Epic  Attending informed that housing resources were offered to pt and pt refused per CSW,  Both CM and CSW attempted to discussed impending discharge with pt - pt acknowledged that he was pending discharge however he continued to communicate that he will come up with his own plan.  Per attending pt is not appropriate for discharge at this time due to blood pressure medications changes today - plan is for pt to have HD tomorrow at [facility].  Physician Advisor informed.     CM informed by bedside nurse that pt is now stating he is homeless - daughter concurs ( CM was told pt by pt that he was from home independent on initial assessement).  Per bedside nurse pt continues to be independently mobilizing here at [facility] - no deficits noted. Pt is now clipped to [facility].   CSW consulted.  CM text paged MD requested discharge consideration   01/21/17 Pt is now s/p fistula placement with daily HD  assessments.  Pt will need to be CLIPPED.   01/20/17 Pt refusing SNF (CSW received consult) and per note also refusing perm access for HD, however will allow daily HD here in house for now. CM requested Palliative Consult - attending has decided to persue psych consult first.   CM will continue to follow for discharge needs Maryclare Labrador, RN 01/24/2017, 8:39 AM

## 2017-01-24 NOTE — Progress Notes (Signed)
Pt refused am labs.

## 2017-01-24 NOTE — Clinical Social Work Note (Addendum)
Clinical Social Work Assessment  Patient Details  Name: Evyn Putzier MRN: [id number] Date of Birth: Mar 13, 1960  Date of referral:  01/24/17               Reason for consult:  Housing Concerns/Homelessness                Permission sought to share information with:  Family Supports Permission granted to share information::  Yes, Verbal Permission Granted  Name::     [place]::     Relationship::  daughter  Contact Information:  [phone]  Housing/Transportation Living arrangements for the past 2 months:  Hotel/Motel Source of Information:  Patient Patient Interpreter Needed:  None Criminal Activity/Legal Involvement Pertinent to Current Situation/Hospitalization:  No - Comment as needed Significant Relationships:  Adult Children Lives with:  Self Do you feel safe going back to the place where you live?  Yes Need for family participation in patient care:  Yes (Comment)  Care giving concerns:  No family at bedside. patient stated he does not have a lot of family but does have his daughter as a support system and she lives in [place].   Facilities manager / plan: CSW met patient at bedside to offer resources for homelessness. 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 Patient stated prior to being hospitalized he was staying in hotel rooms. Patient stated he has some money but "his placed between a rock and a hard place". CSW asked patient to elaborate on what he meant by that but stated its complicated. CSW asked patient if staying with daughter would be a possibility. Patient stated his daughter is looking into a 'plan". CSW printed resources for homeless shelters for patient but patient stated "he is not interested and does not want the resources". CSW made patient aware that he will be discharge soon. Patient stated he wants to get in his truck and drive up to [place] to work. CSW stated that working might not be a good idea since patient is  scheduled to start outpatient dialysis tomorrow. CSW asked patient what she can do to assist him in a discharge plan and patient stated "he does not need CSW help and he will come up with a plan himself". Patient has resources to stay at a hotel upon discharge and patient stated he will work out a plan himself. CSW reached out to social work A.D and he stated that a homeless shelter list is the only resources available to patient.  Employment status:  Therapist, music:  Medicaid In [place] PT Recommendations:  No Follow Up Information / Referral to community resources:  Shelter  Patient/Family's Response to care:  Patient contacted patients daughter while CSW was present in the room and was unable to get a hold of her. Patient aware that he might have some work restriction upon discharge   Patient/Family's Understanding of and Emotional Response to Diagnosis, Current Treatment, and Prognosis:  Patient has question for MD regarding what restriction he has. Patient eager to go back to work as soon as possible   Emotional Assessment Appearance:  Appears stated age Attitude/Demeanor/Rapport:  Self-Confident Affect (typically observed):  Accepting, Calm Orientation:  Oriented to Self, Oriented to Situation, Oriented to  Time Alcohol / Substance use:    Psych involvement (Current and /or in the community):     Discharge Needs  Concerns to be addressed:  Homelessness Readmission within the last 30 days:  No Current  discharge risk:  Homeless Barriers to Discharge:  Homeless with medical needs   Wende Neighbors, LCSW 01/24/2017, 11:30 AM

## 2017-01-24 NOTE — Progress Notes (Signed)
[facility]                                                                              Patient Demographics  Jason [name], is a 57 y.o. male, DOB - [birth year], OHY:[id number]  Admit date - 01/16/2017   Admitting Physician A Melven Sartorius., MD  Outpatient Primary MD for the patient is Wandra Mannan, NP  Outpatient specialists:   LOS - 8  days   Medical records reviewed and are as summarized below:    Chief Complaint  Patient presents with  . Leg Swelling       Brief summary   Jason [name] a 57 y.o.malewith medical history significant ofhypertension, advanced chronic kidney disease lost to follow up, anemia, inguinal hernia, medical non compliance presenting with worsening shortness of breath, lower extremity edema, and altered mental status.in the ED, hypertensive emergency, fluid overload with worsening renal function, heart failure, acute encephalopathy and bilateral chronic venous stasis with stasis dermatitis/cellulitis. New ESRD started dialysis 01/19/17. Post R brachial cephalic fistula creation 01/20/17. Follow up with vascular surgery in 6 weeks . Do not stick fistula for at least 12 weeks. New TDC placed on 01/20/17. HD spot [name] [facility]  01/23/17: Patient refuses chemical DVT ppx heparin sq. 01/24/17: Patient refuses hydralazine po stating that it makes him cough. Hypertensive medications adjusted to maintain compliance. Cardiology also following.  01/24/17: Persistent b/l LE weeping despite fluid removal from dialysis sessions. Wound care specialist consulted for recommendations and to educate patient on how to take care of bilateral lower extremity wounds. Patient denies dyspnea, palpitations or chest pain.   Patient lives in [place]. May need secure housing/placement to continue his dialysis sessions and maintain compliance. Possible discharge tomorrow 01/25/17 after dialysis session, if BP/cellulitis stable and patient can safely be  discharged to a more secure place than motels, if at all possible. Case manager following, [name].  Assessment & Plan    Principal Problem:    Acute hypoxic respiratory failure secondary to acute on chronic HFREF 35-40%, flash pulmonary edema, hypervolemia due to new ESRD -2D echo showed EF of 35-40% with moderate diffuse hypokinesis, grade 2 diastolic dysfunction -Cardiology following -Hemodialysis helping with peripheral edema and acute CHF exacerbation  Hypertensive emergency with flash pulmonary edema, improving on HD - BP 224/171 at the time of admission  - BP currently improving - continue amlodipine, Coreg, imdur - patient refuses hydralazine stating it makes him cough - clonidine 0.1 mg daily - will continue to improve on hemodialysis  - continue dialysis to improve BP and fluid status  Active Problems:   Acute kidney injury ([place]) on advanced chronic kidney disease, stage V, progressed to new ESRD POD # 4 post right brachial cephalic fistula creation (01/20/17) -Nephrology following -Vascular surgery consulted for permanent access -HD spot [facility] -HD 01/22/17  Bilateral chronic venous stasis with stasis dermatitis with suspected super imposed cellulitis -CT with severe soft tissue edema -Wound care specialist consulted for recommendations and for patient education -Continue IV vancomycin day # 8. Managed by pharmacy -CBC, BMET pending, blood cultures negative  Anemia of chronic disease in the setting of ESRD  on HD -hg 9 from 11 -aranesp per nephrology -cbc am  Severe protein calorie malnutrition -albumin 1.7 -pro stat oral supplement -cmp am   Elevated troponin in the setting of hypertensive emergency -Troponin peaked at 0.43 and trended down suspect due to ESRD and demand ischemia -2D echo with EF of 35-40%  (01/17/17) with moderate diffuse hypokinesis  -Cardiology following -Repeat echo in 3 months, if LVEF not improved cardiology will assess for  ischemia  Left knee pain, resolved -Ruled out acute gout -ESR normal -CRP, uric acid elevated in the setting of ESRD  Medical non compliance -Non compliance with medical management  -Psychiatry consulted and determined patient has capacity.  Code Status: Full  DVT Prophylaxis: refuses chemical DVT ppx; foot pumps Family Communication: none at bedside today Disposition Plan: will stay another midnight for IV antibiotics, LE dressing changes, hemodialysis, and to continue blood pressure control.  Time Spent in minutes  35 minutes  Procedures:  2D echo: Systolic function was moderately reduced.The estimated ejection fraction was in the range of 35% to 40%.Moderate diffuse hypokinesis. Features are consistent with a pseudonormal left ventricular filling pattern, with concomitant abnormal relaxation and increased filling pressure (grade 2 diastolic dysfunction)  ABI: Normal   Consultants:   Nephrology Vascular surgery Interventional radiology Wound care specialist  Antimicrobials:   IV vancomycin day# 8   Medications  Scheduled Meds: . amLODipine  10 mg Oral Daily  . calcium acetate  667 mg Oral TID WC  . carvedilol  25 mg Oral BID WC  . cloNIDine  0.1 mg Oral BID  . [START ON 01/25/2017] darbepoetin (ARANESP) injection - DIALYSIS  60 mcg Intravenous Q Tue-HD  . feeding supplement (NEPRO CARB STEADY)  237 mL Oral TID WC  . feeding supplement (PRO-STAT SUGAR FREE 64)  30 mL Oral BID  . heparin injection (subcutaneous)  5,000 Units Subcutaneous Q8H  . isosorbide mononitrate  60 mg Oral Daily   Continuous Infusions: . sodium chloride Stopped (01/20/17 1508)  . sodium chloride    . sodium chloride     PRN Meds:.sodium chloride, sodium chloride, acetaminophen, alteplase, heparin, heparin, HYDROmorphone (DILAUDID) injection, labetalol, lidocaine (PF), lidocaine-prilocaine, oxyCODONE-acetaminophen, pentafluoroprop-tetrafluoroeth   Antibiotics   Anti-infectives  (From admission, onward)   Start     Dose/Rate Route Frequency Ordered Stop   01/22/17 1700  vancomycin (VANCOCIN) IVPB 1000 mg/200 mL premix     1,000 mg 200 mL/hr over 60 Minutes Intravenous  Once 01/22/17 1554 01/23/17 0026   01/21/17 0600  cefUROXime (ZINACEF) 1.5 g in dextrose 5 % 50 mL IVPB  Status:  Discontinued     1.5 g 100 mL/hr over 30 Minutes Intravenous To ShortStay Surgical 01/20/17 0749 01/20/17 1208   01/20/17 1900  vancomycin (VANCOCIN) IVPB 1000 mg/200 mL premix     1,000 mg 200 mL/hr over 60 Minutes Intravenous  Once 01/20/17 1736 01/20/17 2129   01/20/17 1227  dextrose 5 % with cefUROXime (ZINACEF) ADS Med    Comments:  Laurita Quint   : cabinet override      01/20/17 1227 01/20/17 1338   01/19/17 1600  vancomycin (VANCOCIN) IVPB 1000 mg/200 mL premix     1,000 mg 200 mL/hr over 60 Minutes Intravenous To Hemodialysis 01/19/17 1520 01/19/17 1825   01/16/17 1800  vancomycin (VANCOCIN) 2,000 mg in sodium chloride 0.9 % 500 mL IVPB     2,000 mg 250 mL/hr over 120 Minutes Intravenous  Once 01/16/17 1734 01/16/17 2344   01/16/17 1730  vancomycin (VANCOCIN)  IVPB 1000 mg/200 mL premix  Status:  Discontinued     1,000 mg 200 mL/hr over 60 Minutes Intravenous  Once 01/16/17 1720 01/16/17 1734       Objective:   Vitals:   01/23/17 2145 01/24/17 0001 01/24/17 0410 01/24/17 0800  BP: 134/86 (!) 139/97 (!) 144/99 (!) 139/92  Pulse:  75 74   Resp:  (!) 23 (!) 23 16  Temp:  98.9 F (37.2 C) 98.6 F (37 C) 98.8 F (37.1 C)  TempSrc:  Oral Oral Oral  SpO2:  98% 95%   Weight:      Height:        Intake/Output Summary (Last 24 hours) at 01/24/2017 1318 Last data filed at 01/24/2017 0411 Gross per 24 hour  Intake 480 ml  Output 100 ml  Net 380 ml     Wt Readings from Last 3 Encounters:  01/22/17 97.5 kg (214 lb 15.2 oz)  04/04/16 93 kg (205 lb)  02/26/15 95.3 kg (210 lb)     Exam  General: Alert and oriented x 3, NAD  Eyes: pupils are round and reactive to  light  HEENT:  Mucous membranes are dry no erythema or exudates  Cardiovascular: S1 S2 clear, RRR   Respiratory: Bibasilar crackles  Gastrointestinal: Soft, nontender, nondistended, + bowel sounds  Ext: bilateral lower extremity edema up to tights with drainage to bilateral lower extremity  Neuro: no new deficits  Musculoskeletal: No digital cyanosis, clubbing. 3+LE edema with oozing bilaterally.  Skin: Erythema, persistent drainage on b/l LE  Psych: Normal affect and demeanor, alert and oriented x3    Data Reviewed:  I have personally reviewed following labs and imaging studies  Micro Results Recent Results (from the past 240 hour(s))  Culture, blood (routine x 2)     Status: None   Collection Time: 01/16/17  5:11 PM  Result Value Ref Range Status   Specimen Description BLOOD RIGHT ANTECUBITAL  Final   Special Requests   Final    BOTTLES DRAWN AEROBIC AND ANAEROBIC Blood Culture adequate volume   Culture   Final    NO GROWTH 5 DAYS Performed at [facility], [address], [place], [place] [address]    Report Status 01/22/2017 FINAL  Final  Culture, blood (routine x 2)     Status: None   Collection Time: 01/16/17  7:44 PM  Result Value Ref Range Status   Specimen Description BLOOD RIGHT ANTECUBITAL  Final   Special Requests   Final    BOTTLES DRAWN AEROBIC AND ANAEROBIC Blood Culture adequate volume   Culture   Final    NO GROWTH 5 DAYS Performed at [facility], [place] [address], [place], [place] [address]    Report Status 01/22/2017 FINAL  Final  MRSA PCR Screening     Status: None   Collection Time: 01/17/17  9:31 PM  Result Value Ref Range Status   MRSA by PCR NEGATIVE NEGATIVE Final    Comment:        The GeneXpert MRSA Assay (FDA approved for NASAL specimens only), is one component of a comprehensive MRSA colonization surveillance program. It is not intended to diagnose MRSA infection nor to guide or monitor treatment for MRSA  infections.     Radiology Reports Dg Chest 2 View  Result Date: 01/16/2017 CLINICAL DATA:  Shortness of breath, BILATERAL lower extremity edema with weeping wounds, on oxygen, history hypertension, former smoker EXAM: CHEST  2 VIEW COMPARISON:  Non FINDINGS:  Enlargement of cardiac silhouette with pulmonary vascular congestion. Rotated to the RIGHT. Subsegmental atelectasis LEFT mid lung and at both bases greater on LEFT. Minimal LEFT pleural effusion. No definite acute infiltrate, pleural effusion or pneumothorax. IMPRESSION: Enlargement of cardiac silhouette with vascular congestion. Scattered atelectasis greater on LEFT with minimal LEFT pleural effusion. Electronically Signed   By: Lavonia Dana M.D.   On: 01/16/2017 13:09   Ct Head Wo Contrast  Result Date: 01/16/2017 CLINICAL DATA:  Headache hypertension EXAM: CT HEAD WITHOUT CONTRAST TECHNIQUE: Contiguous axial images were obtained from the base of the skull through the vertex without intravenous contrast. COMPARISON:  None. FINDINGS: Motion degraded images limit assessment. Brain: No evidence of acute large vascular territory infarction, hemorrhage, hydrocephalus, extra-axial collection or mass lesion/mass effect. No intraventricular hemorrhage. No effacement of the basal cisterns or fourth ventricle. Hyperdensity along the falx likely related to falcine calcifications, without eccentric or focal thickening to suggest hemorrhage. Minimal periventricular white matter hypodensities consistent with small vessel ischemic disease. Vascular: No hyperdense vessel or unexpected calcification. Skull: Negative for fracture or focal lesion. Sinuses/Orbits: No acute finding. Other: None IMPRESSION: Limited by motion artifacts. Minimal small vessel ischemic disease. No acute intracranial abnormality. Electronically Signed   By: Ashley Royalty M.D.   On: 01/16/2017 18:09   US Renal  Result Date: 01/16/2017 CLINICAL DATA:  Acute renal failure.  History of  hypertension. EXAM: RENAL / URINARY TRACT ULTRASOUND COMPLETE COMPARISON:  01/29/2015 FINDINGS: Right Kidney: Length: 9.9 cm. Echogenic renal parenchyma. No focal mass or hydronephrosis. Left Kidney: Length: 9.2 cm. Echogenic renal parenchyma. No focal mass or hydronephrosis. Bladder: Small amount of debris is noted within the bladder. Additional: Bilateral pleural effusions are noted. IMPRESSION: 1. Echogenic renal parenchyma bilaterally, compatible with the history of renal failure. No hydronephrosis. 2. Bilateral pleural effusions. Electronically Signed   By: Nolon Nations M.D.   On: 01/16/2017 18:47   Ct Tibia Fibula Left Wo Contrast  Result Date: 01/16/2017 CLINICAL DATA:  Cellulitis.  Evaluate for osteomyelitis. EXAM: CT OF THE LOWER LEFT EXTREMITY WITHOUT CONTRAST TECHNIQUE: Multidetector CT imaging of the lower left extremity was performed according to the standard protocol. COMPARISON:  None. FINDINGS: Bones/Joint/Cartilage No fracture or dislocation. Normal alignment. No joint effusion. Moderate medial femorotibial compartment joint space narrowing with marginal osteophytes and subchondral sclerosis. Mild lateral femorotibial compartment joint space narrowing with small marginal osteophytes. Small patellofemoral compartment marginal osteophytes. Subchondral cystic changes in the medial corner of the talar dome. Small anterior tibial plafond marginal osteophyte. No aggressive osseous lesion. No periosteal reaction or bone destruction. Ligaments Ligaments are suboptimally evaluated by CT. Muscles and Tendons Muscles are normal.  No muscle atrophy. Soft tissue Severe soft tissue edema within the subcutaneous fat of the left lower leg. Severe soft tissue edema within the subcutaneous fat of the visualized portions of the right lower leg. No soft tissue mass. IMPRESSION: 1. Severe soft tissue edema within the subcutaneous fat of the left lower leg. Severe soft tissue edema within the subcutaneous fat of  the visualized portions of the right lower leg. Differential considerations include severe cellulitis versus venous stasis. Electronically Signed   By: Kathreen Devoid   On: 01/16/2017 17:32   Ir Fluoro Guide Cv Line Right  Result Date: 01/18/2017 CLINICAL DATA:  Renal failure, needs venous access for hemodialysis EXAM: EXAM RIGHT IJ CATHETER PLACEMENT UNDER ULTRASOUND AND FLUOROSCOPIC GUIDANCE TECHNIQUE: The procedure, risks (including but not limited to bleeding, infection, organ damage, pneumothorax), benefits, and alternatives were explained to  the patient. Questions regarding the procedure were encouraged and answered. The patient understands and consents to the procedure. Patency of the right IJ vein was confirmed with ultrasound with image documentation. An appropriate skin site was determined. Skin site was marked. Region was prepped using maximum barrier technique including cap and mask, sterile gown, sterile gloves, large sterile sheet, and Chlorhexidine as cutaneous antisepsis. The region was infiltrated locally with 1% lidocaine. Under real-time ultrasound guidance, the right IJ vein was accessed with a 21 gauge needle; the needle tip within the vein was confirmed with ultrasound image documentation. The needle exchanged over a 018 guidewire for vascular dilator which allowed advancement of a 20 cm Mahurkar catheter. This was positioned with the tip at the cavoatrial junction. Spot chest radiograph shows good positioning and no pneumothorax. Catheter was flushed and sutured externally with 0-Prolene sutures. Patient tolerated the procedure well. FLUOROSCOPY TIME:  Less than 6 seconds COMPLICATIONS: COMPLICATIONS none IMPRESSION: 1. Technically successful right IJ Mahurkar catheter placement. Electronically Signed   By: Lucrezia Europe M.D.   On: 01/18/2017 12:53   Ir US Guide Vasc Access Right  Result Date: 01/18/2017 CLINICAL DATA:  Renal failure, needs venous access for hemodialysis EXAM: EXAM RIGHT IJ  CATHETER PLACEMENT UNDER ULTRASOUND AND FLUOROSCOPIC GUIDANCE TECHNIQUE: The procedure, risks (including but not limited to bleeding, infection, organ damage, pneumothorax), benefits, and alternatives were explained to the patient. Questions regarding the procedure were encouraged and answered. The patient understands and consents to the procedure. Patency of the right IJ vein was confirmed with ultrasound with image documentation. An appropriate skin site was determined. Skin site was marked. Region was prepped using maximum barrier technique including cap and mask, sterile gown, sterile gloves, large sterile sheet, and Chlorhexidine as cutaneous antisepsis. The region was infiltrated locally with 1% lidocaine. Under real-time ultrasound guidance, the right IJ vein was accessed with a 21 gauge needle; the needle tip within the vein was confirmed with ultrasound image documentation. The needle exchanged over a 018 guidewire for vascular dilator which allowed advancement of a 20 cm Mahurkar catheter. This was positioned with the tip at the cavoatrial junction. Spot chest radiograph shows good positioning and no pneumothorax. Catheter was flushed and sutured externally with 0-Prolene sutures. Patient tolerated the procedure well. FLUOROSCOPY TIME:  Less than 6 seconds COMPLICATIONS: COMPLICATIONS none IMPRESSION: 1. Technically successful right IJ Mahurkar catheter placement. Electronically Signed   By: Lucrezia Europe M.D.   On: 01/18/2017 12:53   Dg Chest Port 1 View  Result Date: 01/20/2017 CLINICAL DATA:  Status post dialysis catheter placement EXAM: PORTABLE CHEST 1 VIEW COMPARISON:  01/16/2017 FINDINGS: Cardiac shadow remains enlarged. New dialysis catheter is noted in satisfactory position. The overall inspiratory effort is poor although no pneumothorax is seen. Mild vascular congestion is again noted. Left basilar atelectatic changes are seen. IMPRESSION: No pneumothorax following dialysis catheter placement.  Mild left basilar atelectasis. Electronically Signed   By: Inez Catalina M.D.   On: 01/20/2017 15:28    Lab Data:  CBC: Recent Labs  Lab 01/19/17 0255 01/20/17 0214 01/21/17 0305 01/22/17 1324  WBC 11.3* 9.5 12.7* 10.3  HGB 11.8* 11.1* 11.4* 9.3*  HCT 35.4* 33.3* 35.3* 28.1*  MCV 84.9 84.5 86.9 86.2  PLT 290 272 254 466   Basic Metabolic Panel: Recent Labs  Lab 01/18/17 1650 01/19/17 0255 01/20/17 0214 01/21/17 0305 01/22/17 1324  NA  --  136 137 136 135  K  --  4.3 3.8 4.8 3.9  CL  --  102 103 100* 103  CO2  --  _0 GLUCOSE  --  92 117* 131* 104*  BUN  --  100* 83* 81* 71*  CREATININE  --  5.47* 4.96* 5.12* 4.69*  CALCIUM  --  7.6* 7.2* 7.3* 7.1*  PHOS 5.9*  --   --   --  3.6   GFR: Estimated Creatinine Clearance: 20.7 mL/min (A) (by C-G formula based on SCr of 4.69 mg/dL (H)). Liver Function Tests: Recent Labs  Lab 01/20/17 0214 01/22/17 1324  AST 12*  --   ALT 24  --   ALKPHOS 45  --   BILITOT 0.7  --   PROT 5.1*  --   ALBUMIN 1.9* 1.7*   No results for input(s): LIPASE, AMYLASE in the last 168 hours. No results for input(s): AMMONIA in the last 168 hours. Coagulation Profile: No results for input(s): INR, PROTIME in the last 168 hours. Cardiac Enzymes: No results for input(s): CKTOTAL, CKMB, CKMBINDEX, TROPONINI in the last 168 hours. BNP (last 3 results) No results for input(s): PROBNP in the last 8760 hours. HbA1C: No results for input(s): HGBA1C in the last 72 hours. CBG: Recent Labs  Lab 01/19/17 0856 01/20/17 0914  GLUCAP 99 87   Lipid Profile: No results for input(s): CHOL, HDL, LDLCALC, TRIG, CHOLHDL, LDLDIRECT in the last 72 hours. Thyroid Function Tests: No results for input(s): TSH, T4TOTAL, FREET4, T3FREE, THYROIDAB in the last 72 hours. Anemia Panel: No results for input(s): VITAMINB12, FOLATE, FERRITIN, TIBC, IRON, RETICCTPCT in the last 72 hours. Urine analysis:    Component Value Date/Time   COLORURINE YELLOW  01/16/2017 1420   APPEARANCEUR CLEAR 01/16/2017 1420   LABSPEC 1.013 01/16/2017 1420   PHURINE 5.0 01/16/2017 1420   GLUCOSEU NEGATIVE 01/16/2017 1420   HGBUR SMALL (A) 01/16/2017 1420   BILIRUBINUR NEGATIVE 01/16/2017 1420   KETONESUR NEGATIVE 01/16/2017 1420   PROTEINUR 100 (A) 01/16/2017 1420   UROBILINOGEN 0.2 02/26/2015 1028   NITRITE NEGATIVE 01/16/2017 1420   LEUKOCYTESUR NEGATIVE 01/16/2017 1420     Kayleen Memos M.D. [facility] 01/24/2017, 1:18 PM  Pager: [phone] Between 7am to 7pm - call Pager - [phone]  After 7pm go to [email or web address] - password [email or web address]  Call night coverage person covering after 7pm

## 2017-01-24 NOTE — Progress Notes (Signed)
CSW met patient in room again to see if patient had any luck in finding family/friends to stay with upon discharge. Patient stated he did not " want to talk about it and when he discharges he will just go". When patient was asked where he would go he stated to [place] "he was not having this conversation any more".   Rhea Pink, MSW,  [place]

## 2017-01-24 NOTE — Progress Notes (Signed)
Progress Note  Patient Name: Jason [name] Date of Encounter: 01/24/2017  Primary Cardiologist: Kirk Ruths, MD   Subjective   For dialysis today Upset that he is still getting hydralazine Has not tolerated in past With coughing   Inpatient Medications    Scheduled Meds: . amLODipine  10 mg Oral Daily  . calcium acetate  667 mg Oral TID WC  . carvedilol  25 mg Oral BID WC  . cloNIDine  0.1 mg Oral BID  . [START ON 01/25/2017] darbepoetin (ARANESP) injection - DIALYSIS  60 mcg Intravenous Q Tue-HD  . feeding supplement (NEPRO CARB STEADY)  237 mL Oral TID WC  . feeding supplement (PRO-STAT SUGAR FREE 64)  30 mL Oral BID  . heparin injection (subcutaneous)  5,000 Units Subcutaneous Q8H  . isosorbide mononitrate  60 mg Oral Daily   Continuous Infusions: . sodium chloride Stopped (01/20/17 1508)  . sodium chloride    . sodium chloride     PRN Meds: sodium chloride, sodium chloride, acetaminophen, alteplase, heparin, heparin, HYDROmorphone (DILAUDID) injection, labetalol, lidocaine (PF), lidocaine-prilocaine, oxyCODONE-acetaminophen, pentafluoroprop-tetrafluoroeth   Vital Signs    Vitals:   01/23/17 1955 01/23/17 2145 01/24/17 0001 01/24/17 0410  BP: 112/79 134/86 (!) 139/97 (!) 144/99  Pulse: 77  75 74  Resp: 20  (!) 23 (!) 23  Temp: 98.7 F (37.1 C)  98.9 F (37.2 C) 98.6 F (37 C)  TempSrc: Oral  Oral Oral  SpO2: 100%  98% 95%  Weight:      Height:        Intake/Output Summary (Last 24 hours) at 01/24/2017 0747 Last data filed at 01/24/2017 0411 Gross per 24 hour  Intake 600 ml  Output 275 ml  Net 325 ml   Filed Weights   01/21/17 1315 01/22/17 1300 01/22/17 1713  Weight: 216 lb 7.9 oz (98.2 kg) 218 lb 11.1 oz (99.2 kg) 214 lb 15.2 oz (97.5 kg)    Telemetry    Sinus with PAT- Personally Reviewed   Physical Exam   Affect appropriate Chronically ill black male  HEENT: normal Neck supple with no adenopathy JVP normal no bruits no thyromegaly Lungs  clear with no wheezing and good diaphragmatic motion Heart:  S1/S2 no murmur, no rub, gallop or click PMI normal dialysis catheter right subclavian  Abdomen: benighn, BS positve, no tenderness, no AAA no bruit.  No HSM or HJR Distal pulses intact with no bruits Plus 2  Edema legs wrapped  Neuro non-focal Skin warm and dry No muscular weakness    Labs    Chemistry Recent Labs  Lab 01/20/17 0214 01/21/17 0305 01/22/17 1324  NA 137 136 135  K 3.8 4.8 3.9  CL 103 100* 103  CO2 25 27 23   GLUCOSE 117* 131* 104*  BUN 83* 81* 71*  CREATININE 4.96* 5.12* 4.69*  CALCIUM 7.2* 7.3* 7.1*  PROT 5.1*  --   --   ALBUMIN 1.9*  --  1.7*  AST 12*  --   --   ALT 24  --   --   ALKPHOS 45  --   --   BILITOT 0.7  --   --   GFRNONAA 12* 12* 13*  GFRAA 14* 13* 15*  ANIONGAP 9 9 9      Hematology Recent Labs  Lab 01/20/17 0214 01/21/17 0305 01/22/17 1324  WBC 9.5 12.7* 10.3  RBC 3.94* 4.06* 3.26*  HGB 11.1* 11.4* 9.3*  HCT 33.3* 35.3* 28.1*  MCV 84.5 86.9 86.2  MCH  28.2 28.1 28.5  MCHC 33.3 32.3 33.1  RDW 13.1 13.4 13.4  PLT 272 254 224    Cardiac Enzymes No results for input(s): TROPONINI in the last 168 hours.  No results for input(s): TROPIPOC in the last 168 hours.   BNP No results for input(s): BNP, PROBNP in the last 168 hours.    Patient Profile     57 year old male with past medical history of hypertension, medical noncompliance and chronic kidney disease for evaluation of cardiomyopathy and elevated troponin.  Was admitted on January 6 with complaints of dyspnea on exertion, orthopnea, weight gain and lower extremity edema.  Patient's initial blood pressure was 224/178. Echocardiogram was personally reviewed.  Ejection fraction appears to be 15-20%.  There is moderate mitral and tricuspid regurgitation. Troponin I 0.43, 0.37, 0.30 and 0.29.     Assessment & Plan    1 cardiomyopathy-likely hypertensive mediated.  Continue present dose of carvedilol, amlodipine  and nitrates.  No ACEI/ARB given his renal insufficiency. No hydralazine due to intolerance  Plan to continue medications for 3 months and then repeat echocardiogram.  Hopefully LV function will improve once blood pressure is controlled.  If not would need ischemia evaluation at that time.  2 elevated troponin-no chest pain; no trend; no further ischemia eval.  3 hypertensive emergency-blood pressure improved have d/c hydralazine due to intolerance will leave up to  Nephrology and primary service to Rx but can consider adding procardia if needed   4 acute systolic/diastolic congestive heart failure-Volume management per dialysis.  5 end-stage renal disease-per nephrology  6 lower extremity cellulitis-Antibiotics per IM.    For questions or updates, please contact [place] Please consult [email or web address] for contact info under Cardiology/STEMI.      Signed, Jenkins Rouge, MD  01/24/2017, 7:47 AM

## 2017-01-24 NOTE — Progress Notes (Signed)
  [facility] Progress Note   Assessment/ Plan:   1.  HTNsive emergency: doing much better.  On Carvedilol, amlodipine, imdur.  Discussed risks/ benefits/ side effects of meds with pt 2.ESRD: TDC and AVF.  Tolerating HD well.  CLIP'd 3. Anemia: getting aranesp 60 q Tuesday, hbg 9.3 4. CKD-MBD: on phoslo, last PTH 273, no calcitriol yet 5. Nutrition: on nepro 6.  Dispo: from a strictly renal perspective OK to go but housing may be a consideration--> to be clear if discharged and truly has no place to go there is a distinct possibility that [facility] could be a nidus of infection  Subjective:    CLIP'd to [facility], starting TTS 2nd shift.  Has TDC and LUE AVF.  Apparently housing is an issue.   Objective:   BP (!) 139/92 (BP Location: Left Arm)   Pulse 74   Temp 98.8 F (37.1 C) (Oral)   Resp 16   Ht 6\' 2"  (1.88 m)   Wt 97.5 kg (214 lb 15.2 oz)   SpO2 95%   BMI 27.60 kg/m   Physical Exam: Gen:NAD, lying in bed CVS: RRR no m/r/g Resp: clear bilaterally Abd: soft nontender NABS Ext: + Unna boots  Labs: BMET Recent Labs  Lab 01/18/17 1650 01/19/17 0255 01/20/17 0214 01/21/17 0305 01/22/17 1324  NA  --  136 137 136 135  K  --  4.3 3.8 4.8 3.9  CL  --  102 103 100* 103  CO2  --  22 25 27 23   GLUCOSE  --  92 117* 131* 104*  BUN  --  100* 83* 81* 71*  CREATININE  --  5.47* 4.96* 5.12* 4.69*  CALCIUM  --  7.6* 7.2* 7.3* 7.1*  PHOS 5.9*  --   --   --  3.6   CBC Recent Labs  Lab 01/19/17 0255 01/20/17 0214 01/21/17 0305 01/22/17 1324  WBC 11.3* 9.5 12.7* 10.3  HGB 11.8* 11.1* 11.4* 9.3*  HCT 35.4* 33.3* 35.3* 28.1*  MCV 84.9 84.5 86.9 86.2  PLT 290 272 254 224    @IMGRELPRIORS @ Medications:    . amLODipine  10 mg Oral Daily  . calcium acetate  667 mg Oral TID WC  . carvedilol  25 mg Oral BID WC  . cloNIDine  0.1 mg Oral BID  . [START ON 01/25/2017] darbepoetin (ARANESP) injection - DIALYSIS  60 mcg Intravenous Q Tue-HD  . feeding supplement (NEPRO  CARB STEADY)  237 mL Oral TID WC  . feeding supplement (PRO-STAT SUGAR FREE 64)  30 mL Oral BID  . heparin injection (subcutaneous)  5,000 Units Subcutaneous Q8H  . isosorbide mononitrate  60 mg Oral Daily     Madelon Lips, MD [facility] pgr [phone] 01/24/2017, 9:52 AM

## 2017-01-25 ENCOUNTER — Other Ambulatory Visit: Payer: Self-pay

## 2017-01-25 DIAGNOSIS — N183 Chronic kidney disease, stage 3 unspecified: Secondary | ICD-10-CM

## 2017-01-25 DIAGNOSIS — I472 Ventricular tachycardia: Secondary | ICD-10-CM

## 2017-01-25 DIAGNOSIS — I4729 Other ventricular tachycardia: Secondary | ICD-10-CM

## 2017-01-25 DIAGNOSIS — Z48812 Encounter for surgical aftercare following surgery on the circulatory system: Secondary | ICD-10-CM

## 2017-01-25 DIAGNOSIS — Z008 Encounter for other general examination: Secondary | ICD-10-CM

## 2017-01-25 LAB — BASIC METABOLIC PANEL
ANION GAP: 10 (ref 5–15)
BUN: 69 mg/dL — ABNORMAL HIGH (ref 6–20)
CALCIUM: 7.1 mg/dL — AB (ref 8.9–10.3)
CHLORIDE: 97 mmol/L — AB (ref 101–111)
CO2: 26 mmol/L (ref 22–32)
Creatinine, Ser: 4.98 mg/dL — ABNORMAL HIGH (ref 0.61–1.24)
GFR calc Af Amer: 14 mL/min — ABNORMAL LOW (ref >60)
GFR calc non Af Amer: 12 mL/min — ABNORMAL LOW (ref >60)
GLUCOSE: 140 mg/dL — AB (ref 65–99)
Potassium: 3.9 mmol/L (ref 3.5–5.1)
Sodium: 133 mmol/L — ABNORMAL LOW (ref 135–145)

## 2017-01-25 LAB — CBC
HEMATOCRIT: 29.5 % — AB (ref 39.0–52.0)
HEMOGLOBIN: 10 g/dL — AB (ref 13.0–17.0)
MCH: 28.8 pg (ref 26.0–34.0)
MCHC: 33.9 g/dL (ref 30.0–36.0)
MCV: 85 fL (ref 78.0–100.0)
Platelets: 223 10*3/uL (ref 150–400)
RBC: 3.47 MIL/uL — ABNORMAL LOW (ref 4.22–5.81)
RDW: 13.5 % (ref 11.5–15.5)
WBC: 7.9 10*3/uL (ref 4.0–10.5)

## 2017-01-25 LAB — VANCOMYCIN, RANDOM: Vancomycin Rm: 16

## 2017-01-25 MED ORDER — CARVEDILOL 25 MG PO TABS: 25.0000 mg | ORAL_TABLET | Freq: Two times a day (BID) | ORAL | 0 refills | Status: DC

## 2017-01-25 MED ORDER — NEPRO/CARBSTEADY PO LIQD: 237.0000 mL | Freq: Three times a day (TID) | ORAL | 0 refills | Status: DC

## 2017-01-25 MED ORDER — AMLODIPINE BESYLATE 10 MG PO TABS: 10.0000 mg | ORAL_TABLET | Freq: Every day | ORAL | 0 refills | Status: DC

## 2017-01-25 MED ORDER — DARBEPOETIN ALFA 60 MCG/0.3ML IJ SOSY
PREFILLED_SYRINGE | INTRAMUSCULAR | Status: AC
  Administered 2017-01-25: 60 ug via INTRAVENOUS
  Filled 2017-01-25: qty 0.3

## 2017-01-25 MED ORDER — CLONIDINE HCL 0.1 MG PO TABS: 0.1000 mg | ORAL_TABLET | Freq: Two times a day (BID) | ORAL | 0 refills | Status: DC

## 2017-01-25 MED ORDER — ISOSORBIDE MONONITRATE ER 60 MG PO TB24: 60.0000 mg | ORAL_TABLET | Freq: Every day | ORAL | 0 refills | Status: DC

## 2017-01-25 MED ORDER — CALCIUM ACETATE (PHOS BINDER) 667 MG PO CAPS: 667.0000 mg | ORAL_CAPSULE | Freq: Three times a day (TID) | ORAL | 0 refills | Status: DC

## 2017-01-25 MED ORDER — NEPRO/CARBSTEADY PO LIQD
237.0000 mL | Freq: Two times a day (BID) | ORAL | Status: DC
  Administered 2017-01-26: 237 mL via ORAL
  Filled 2017-01-25 (×4): qty 237

## 2017-01-25 NOTE — Progress Notes (Signed)
[facility] Progress Note   Subjective:   Tolerating dialysis well. No new complaints. CLIP's to [facility] for TTS 2nd shift.  Objective Vitals:   01/25/17 0930 01/25/17 1000 01/25/17 1030 01/25/17 1054  BP: (!) 151/95 (!) 162/103 (!) 152/104 (!) 149/97  Pulse: 77 75 74 73  Resp:    (!) 24  Temp:    98.3 F (36.8 C)  TempSrc:    Oral  SpO2:    98%  Weight:    97.5 kg (214 lb 15.2 oz)  Height:       Physical Exam General:NAD, chronically ill appearing male, laying in bed Heart: RRR, no murmur, rub or gallop appreciated Lungs: nml WOB, mostly clear with bibasilar crackles, diminished BS Abdomen:soft, NTND, +BS Extremities:4+ pitting edema b/l LE, +ULNA boots b/l LE Dialysis Access: TDC, LU AVF +b/t   Filed Weights   01/22/17 1713 01/25/17 0645 01/25/17 1054  Weight: 97.5 kg (214 lb 15.2 oz) 101.8 kg (224 lb 6.9 oz) 97.5 kg (214 lb 15.2 oz)    Intake/Output Summary (Last 24 hours) at 01/25/2017 1221 Last data filed at 01/25/2017 1054 Gross per 24 hour  Intake 660 ml  Output 4350 ml  Net -3690 ml    Additional Objective Labs: Basic Metabolic Panel: Recent Labs  Lab 01/18/17 1650  01/22/17 1324 01/24/17 1408 01/25/17 0303  NA  --    < > 135 135 133*  K  --    < > 3.9 4.1 3.9  CL  --    < > 103 99* 97*  CO2  --    < > 23 26 26   GLUCOSE  --    < > 104* 129* 140*  BUN  --    < > 71* 62* 69*  CREATININE  --    < > 4.69* 4.79* 4.98*  CALCIUM  --    < > 7.1* 7.1* 7.1*  PHOS 5.9*  --  3.6  --   --    < > = values in this interval not displayed.   Liver Function Tests: Recent Labs  Lab 01/20/17 0214 01/22/17 1324 01/24/17 1408  AST 12*  --  28  ALT 24  --  32  ALKPHOS 45  --  41  BILITOT 0.7  --  0.6  PROT 5.1*  --  4.9*  ALBUMIN 1.9* 1.7* 1.7*   CBC: Recent Labs  Lab 01/20/17 0214 01/21/17 0305 01/22/17 1324 01/24/17 1408 01/25/17 0303  WBC 9.5 12.7* 10.3 7.4 7.9  HGB 11.1* 11.4* 9.3* 9.4* 10.0*  HCT 33.3* 35.3* 28.1* 27.4* 29.5*  MCV  84.5 86.9 86.2 85.6 85.0  PLT 272 254 224 219 223    CBG: Recent Labs  Lab 01/19/17 0856 01/20/17 0914  GLUCAP 99 87    Medications: . sodium chloride Stopped (01/20/17 1508)   . amLODipine  10 mg Oral Daily  . calcium acetate  667 mg Oral TID WC  . carvedilol  25 mg Oral BID WC  . cloNIDine  0.1 mg Oral BID  . darbepoetin (ARANESP) injection - DIALYSIS  60 mcg Intravenous Q Tue-HD  . feeding supplement (NEPRO CARB STEADY)  237 mL Oral TID WC  . feeding supplement (PRO-STAT SUGAR FREE 64)  30 mL Oral BID  . heparin injection (subcutaneous)  5,000 Units Subcutaneous Q8H  . isosorbide mononitrate  60 mg Oral Daily     Assessment/Plan: 1. Hypertensive emergency - improved on carvedilol, amlodipine, Imdur.  2. ESRD - TDC and  AVF. Tolerating HD well. CLIP'd to [facility] TTS 2nd shift. Orders will be written upon D/c. K 3.9. 3. Anemia of CKD- Hgb stable, 10.0 - getting 73mcg aranesp qwk (Tues) 4. Secondary hyperparathyroidism - Cor Ca 1.9, P 3.6, continue binders. No VDRA. 5. HTN/volume - BP improved post HD, titrate down as tolerated.  6. Nutrition - Alb 1.7, Renal diet, with fluid restriction. Renavite, Nepro TID. [place] to D/c from renal standpoint as long as patient has housing/hotel accommodations.  Jen Mow, PA-C [facility] Pager: [phone] 01/25/2017,12:21 PM  LOS: 9 days

## 2017-01-25 NOTE — Consult Note (Addendum)
WOC consult requested for bilat legs.  This was already performed on 1/7; please refer to previous consult note for assessment and measurements, and topical treatment orders have been provided for bedside nurses to perform.  Pt could benefit from home health visits for wound care after discharge, please order if desired. Please re-consult if further assistance is needed.  Thank-you,  Julien Girt MSN, [place], [place], [place], [place]

## 2017-01-25 NOTE — Progress Notes (Deleted)
[facility]                                                                              Patient Demographics  Jason [name], is a 57 y.o. male, DOB - 1960/05/11, OJJ:[id number]  Admit date - 01/16/2017   Admitting Physician A Melven Sartorius., MD  Outpatient Primary MD for the patient is Wandra Mannan, NP  Outpatient specialists:   LOS - 9  days   Medical records reviewed and are as summarized below:    Chief Complaint  Patient presents with  . Leg Swelling       Brief summary   Jason [name] a 57 y.o.malewith medical history significant ofhypertension, advanced chronic kidney disease lost to follow up, anemia, inguinal hernia, medical non compliance presenting with worsening shortness of breath, lower extremity edema, and altered mental status.in the ED, hypertensive emergency, fluid overload with worsening renal function, heart failure, acute encephalopathy and bilateral chronic venous stasis with stasis dermatitis/cellulitis. New ESRD started dialysis 01/19/17. Post R brachial cephalic fistula creation 01/20/17. Follow up with vascular surgery in 6 weeks . Do not stick fistula for at least 12 weeks. New TDC placed on 01/20/17. HD spot [place] [facility].  01/23/17: Patient refuses chemical DVT ppx heparin sq. 01/24/17: Patient refuses hydralazine po stating that it makes him cough. Hypertensive medications adjusted to maintain compliance. Cardiology also following.  01/24/17: Persistent b/l LE weeping despite fluid removal from dialysis sessions. Wound care specialist consulted for recommendations and to educate patient on how to take care of bilateral lower extremity wounds. Patient denies dyspnea, palpitations or chest pain.   Patient lives in [place]. May need secure housing/placement to continue his dialysis sessions and maintain compliance.   Patient is in need of permanent access per nephrology, Dr Deterging, and may not be accepted for outpatient  dialysis without permanent access. Nephrology following Possible discharge tomorrow 01/25/17 after dialysis session, if BP/cellulitis stable and patient can safely be discharged to a more secure place than motels, if at all possible. Case manager following, [name].  Assessment & Plan    Principal Problem:    Acute hypoxic respiratory failure secondary to acute on chronic HFREF 35-40%, flash pulmonary edema, hypervolemia due to new ESRD -2D echo showed EF of 35-40% with moderate diffuse hypokinesis, grade 2 diastolic dysfunction -Cardiology following -Hemodialysis helping with peripheral edema and acute CHF exacerbation  Hypertensive emergency with flash pulmonary edema, improving on HD - BP 224/171 at the time of admission  - BP currently improving - continue amlodipine, Coreg, imdur - patient refuses hydralazine stating it makes him cough - clonidine 0.1 mg daily - will continue to improve on hemodialysis  - continue dialysis to improve BP and fluid status  Active Problems:   Acute kidney injury ([place]) on advanced chronic kidney disease, stage V, progressed to new ESRD POD # 4 post right brachial cephalic fistula creation (01/20/17) -Nephrology following -Vascular surgery consulted for permanent access -HD spot [facility] -HD 01/22/17  Bilateral chronic venous stasis with stasis dermatitis with suspected super imposed cellulitis -CT with severe soft tissue edema -Wound care specialist consulted for recommendations and for patient  education -Continue IV vancomycin day # 8. Managed by pharmacy -CBC, BMET pending, blood cultures negative  Anemia of chronic disease in the setting of ESRD on HD -hg 9 from 11 -aranesp per nephrology -cbc am  Severe protein calorie malnutrition -albumin 1.7 -pro stat oral supplement -cmp am   Elevated troponin in the setting of hypertensive emergency -Troponin peaked at 0.43 and trended down suspect due to ESRD and demand ischemia -2D  echo with EF of 35-40%  (01/17/17) with moderate diffuse hypokinesis  -Cardiology following -Repeat echo in 3 months, if LVEF not improved cardiology will assess for ischemia  Left knee pain, resolved -Ruled out acute gout -ESR normal -CRP, uric acid elevated in the setting of ESRD  Medical non compliance -Non compliance with medical management  -Psychiatry consulted and determined patient has capacity.  Code Status: Full  DVT Prophylaxis: refuses chemical DVT ppx; foot pumps Family Communication: none at bedside today Disposition Plan: will stay another midnight for IV antibiotics, LE dressing changes, hemodialysis, and to continue blood pressure control.  Time Spent in minutes  35 minutes  Procedures:  2D echo: Systolic function was moderately reduced.The estimated ejection fraction was in the range of 35% to 40%.Moderate diffuse hypokinesis. Features are consistent with a pseudonormal left ventricular filling pattern, with concomitant abnormal relaxation and increased filling pressure (grade 2 diastolic dysfunction)  ABI: Normal   Consultants:   Nephrology Vascular surgery Interventional radiology Wound care specialist  Antimicrobials:   IV vancomycin day# 8   Medications  Scheduled Meds: . amLODipine  10 mg Oral Daily  . calcium acetate  667 mg Oral TID WC  . carvedilol  25 mg Oral BID WC  . cloNIDine  0.1 mg Oral BID  . darbepoetin (ARANESP) injection - DIALYSIS  60 mcg Intravenous Q Tue-HD  . feeding supplement (NEPRO CARB STEADY)  237 mL Oral TID WC  . feeding supplement (PRO-STAT SUGAR FREE 64)  30 mL Oral BID  . heparin injection (subcutaneous)  5,000 Units Subcutaneous Q8H  . isosorbide mononitrate  60 mg Oral Daily   Continuous Infusions: . sodium chloride Stopped (01/20/17 1508)   PRN Meds:.acetaminophen, heparin, HYDROmorphone (DILAUDID) injection, labetalol, oxyCODONE-acetaminophen   Antibiotics   Anti-infectives (From admission, onward)     Start     Dose/Rate Route Frequency Ordered Stop   01/22/17 1700  vancomycin (VANCOCIN) IVPB 1000 mg/200 mL premix     1,000 mg 200 mL/hr over 60 Minutes Intravenous  Once 01/22/17 1554 01/23/17 0026   01/21/17 0600  cefUROXime (ZINACEF) 1.5 g in dextrose 5 % 50 mL IVPB  Status:  Discontinued     1.5 g 100 mL/hr over 30 Minutes Intravenous To ShortStay Surgical 01/20/17 0749 01/20/17 1208   01/20/17 1900  vancomycin (VANCOCIN) IVPB 1000 mg/200 mL premix     1,000 mg 200 mL/hr over 60 Minutes Intravenous  Once 01/20/17 1736 01/20/17 2129   01/20/17 1227  dextrose 5 % with cefUROXime (ZINACEF) ADS Med    Comments:  Laurita Quint   : cabinet override      01/20/17 1227 01/20/17 1338   01/19/17 1600  vancomycin (VANCOCIN) IVPB 1000 mg/200 mL premix     1,000 mg 200 mL/hr over 60 Minutes Intravenous To Hemodialysis 01/19/17 1520 01/19/17 1825   01/16/17 1800  vancomycin (VANCOCIN) 2,000 mg in sodium chloride 0.9 % 500 mL IVPB     2,000 mg 250 mL/hr over 120 Minutes Intravenous  Once 01/16/17 1734 01/16/17 2344   01/16/17 1730  vancomycin (VANCOCIN) IVPB 1000 mg/200 mL premix  Status:  Discontinued     1,000 mg 200 mL/hr over 60 Minutes Intravenous  Once 01/16/17 1720 01/16/17 1734       Objective:   Vitals:   01/25/17 1000 01/25/17 1030 01/25/17 1054 01/25/17 1225  BP: (!) 162/103 (!) 152/104 (!) 149/97 (!) 140/99  Pulse: 75 74 73   Resp:   (!) 24   Temp:   98.3 F (36.8 C)   TempSrc:   Oral   SpO2:   98%   Weight:   97.5 kg (214 lb 15.2 oz)   Height:        Intake/Output Summary (Last 24 hours) at 01/25/2017 1406 Last data filed at 01/25/2017 1054 Gross per 24 hour  Intake 660 ml  Output 4350 ml  Net -3690 ml     Wt Readings from Last 3 Encounters:  01/25/17 97.5 kg (214 lb 15.2 oz)  04/04/16 93 kg (205 lb)  02/26/15 95.3 kg (210 lb)     Exam  General: Alert and oriented x 3, NAD  Eyes: pupils are round and reactive to light  HEENT:  Mucous membranes are  dry no erythema or exudates  Cardiovascular: S1 S2 clear, RRR   Respiratory: Bibasilar crackles  Gastrointestinal: Soft, nontender, nondistended, + bowel sounds  Ext: bilateral lower extremity edema up to tights with drainage to bilateral lower extremity  Neuro: no new deficits  Musculoskeletal: No digital cyanosis, clubbing. 3+LE edema with oozing bilaterally.  Skin: Erythema, persistent drainage on b/l LE  Psych: Normal affect and demeanor, alert and oriented x3    Data Reviewed:  I have personally reviewed following labs and imaging studies  Micro Results Recent Results (from the past 240 hour(s))  Culture, blood (routine x 2)     Status: None   Collection Time: 01/16/17  5:11 PM  Result Value Ref Range Status   Specimen Description BLOOD RIGHT ANTECUBITAL  Final   Special Requests   Final    BOTTLES DRAWN AEROBIC AND ANAEROBIC Blood Culture adequate volume   Culture   Final    NO GROWTH 5 DAYS Performed at [facility], [address] [place], [place] [address]    Report Status 01/22/2017 FINAL  Final  Culture, blood (routine x 2)     Status: None   Collection Time: 01/16/17  7:44 PM  Result Value Ref Range Status   Specimen Description BLOOD RIGHT ANTECUBITAL  Final   Special Requests   Final    BOTTLES DRAWN AEROBIC AND ANAEROBIC Blood Culture adequate volume   Culture   Final    NO GROWTH 5 DAYS Performed at [facility], [place] [address], [place], [place] [address]    Report Status 01/22/2017 FINAL  Final  MRSA PCR Screening     Status: None   Collection Time: 01/17/17  9:31 PM  Result Value Ref Range Status   MRSA by PCR NEGATIVE NEGATIVE Final    Comment:        The GeneXpert MRSA Assay (FDA approved for NASAL specimens only), is one component of a comprehensive MRSA colonization surveillance program. It is not intended to diagnose MRSA infection nor to guide or monitor treatment for MRSA infections.     Radiology Reports Dg Chest 2  View  Result Date: 01/16/2017 CLINICAL DATA:  Shortness of breath, BILATERAL lower extremity edema with weeping wounds, on oxygen, history hypertension, former smoker EXAM: CHEST  2 VIEW COMPARISON:  Non  FINDINGS: Enlargement of cardiac silhouette with pulmonary vascular congestion. Rotated to the RIGHT. Subsegmental atelectasis LEFT mid lung and at both bases greater on LEFT. Minimal LEFT pleural effusion. No definite acute infiltrate, pleural effusion or pneumothorax. IMPRESSION: Enlargement of cardiac silhouette with vascular congestion. Scattered atelectasis greater on LEFT with minimal LEFT pleural effusion. Electronically Signed   By: Lavonia Dana M.D.   On: 01/16/2017 13:09   Ct Head Wo Contrast  Result Date: 01/16/2017 CLINICAL DATA:  Headache hypertension EXAM: CT HEAD WITHOUT CONTRAST TECHNIQUE: Contiguous axial images were obtained from the base of the skull through the vertex without intravenous contrast. COMPARISON:  None. FINDINGS: Motion degraded images limit assessment. Brain: No evidence of acute large vascular territory infarction, hemorrhage, hydrocephalus, extra-axial collection or mass lesion/mass effect. No intraventricular hemorrhage. No effacement of the basal cisterns or fourth ventricle. Hyperdensity along the falx likely related to falcine calcifications, without eccentric or focal thickening to suggest hemorrhage. Minimal periventricular white matter hypodensities consistent with small vessel ischemic disease. Vascular: No hyperdense vessel or unexpected calcification. Skull: Negative for fracture or focal lesion. Sinuses/Orbits: No acute finding. Other: None IMPRESSION: Limited by motion artifacts. Minimal small vessel ischemic disease. No acute intracranial abnormality. Electronically Signed   By: Ashley Royalty M.D.   On: 01/16/2017 18:09   US Renal  Result Date: 01/16/2017 CLINICAL DATA:  Acute renal failure.  History of hypertension. EXAM: RENAL / URINARY TRACT ULTRASOUND COMPLETE  COMPARISON:  01/29/2015 FINDINGS: Right Kidney: Length: 9.9 cm. Echogenic renal parenchyma. No focal mass or hydronephrosis. Left Kidney: Length: 9.2 cm. Echogenic renal parenchyma. No focal mass or hydronephrosis. Bladder: Small amount of debris is noted within the bladder. Additional: Bilateral pleural effusions are noted. IMPRESSION: 1. Echogenic renal parenchyma bilaterally, compatible with the history of renal failure. No hydronephrosis. 2. Bilateral pleural effusions. Electronically Signed   By: Nolon Nations M.D.   On: 01/16/2017 18:47   Ct Tibia Fibula Left Wo Contrast  Result Date: 01/16/2017 CLINICAL DATA:  Cellulitis.  Evaluate for osteomyelitis. EXAM: CT OF THE LOWER LEFT EXTREMITY WITHOUT CONTRAST TECHNIQUE: Multidetector CT imaging of the lower left extremity was performed according to the standard protocol. COMPARISON:  None. FINDINGS: Bones/Joint/Cartilage No fracture or dislocation. Normal alignment. No joint effusion. Moderate medial femorotibial compartment joint space narrowing with marginal osteophytes and subchondral sclerosis. Mild lateral femorotibial compartment joint space narrowing with small marginal osteophytes. Small patellofemoral compartment marginal osteophytes. Subchondral cystic changes in the medial corner of the talar dome. Small anterior tibial plafond marginal osteophyte. No aggressive osseous lesion. No periosteal reaction or bone destruction. Ligaments Ligaments are suboptimally evaluated by CT. Muscles and Tendons Muscles are normal.  No muscle atrophy. Soft tissue Severe soft tissue edema within the subcutaneous fat of the left lower leg. Severe soft tissue edema within the subcutaneous fat of the visualized portions of the right lower leg. No soft tissue mass. IMPRESSION: 1. Severe soft tissue edema within the subcutaneous fat of the left lower leg. Severe soft tissue edema within the subcutaneous fat of the visualized portions of the right lower leg. Differential  considerations include severe cellulitis versus venous stasis. Electronically Signed   By: Kathreen Devoid   On: 01/16/2017 17:32   Ir Fluoro Guide Cv Line Right  Result Date: 01/18/2017 CLINICAL DATA:  Renal failure, needs venous access for hemodialysis EXAM: EXAM RIGHT IJ CATHETER PLACEMENT UNDER ULTRASOUND AND FLUOROSCOPIC GUIDANCE TECHNIQUE: The procedure, risks (including but not limited to bleeding, infection, organ damage, pneumothorax), benefits, and alternatives were explained  to the patient. Questions regarding the procedure were encouraged and answered. The patient understands and consents to the procedure. Patency of the right IJ vein was confirmed with ultrasound with image documentation. An appropriate skin site was determined. Skin site was marked. Region was prepped using maximum barrier technique including cap and mask, sterile gown, sterile gloves, large sterile sheet, and Chlorhexidine as cutaneous antisepsis. The region was infiltrated locally with 1% lidocaine. Under real-time ultrasound guidance, the right IJ vein was accessed with a 21 gauge needle; the needle tip within the vein was confirmed with ultrasound image documentation. The needle exchanged over a 018 guidewire for vascular dilator which allowed advancement of a 20 cm Mahurkar catheter. This was positioned with the tip at the cavoatrial junction. Spot chest radiograph shows good positioning and no pneumothorax. Catheter was flushed and sutured externally with 0-Prolene sutures. Patient tolerated the procedure well. FLUOROSCOPY TIME:  Less than 6 seconds COMPLICATIONS: COMPLICATIONS none IMPRESSION: 1. Technically successful right IJ Mahurkar catheter placement. Electronically Signed   By: Lucrezia Europe M.D.   On: 01/18/2017 12:53   Ir US Guide Vasc Access Right  Result Date: 01/18/2017 CLINICAL DATA:  Renal failure, needs venous access for hemodialysis EXAM: EXAM RIGHT IJ CATHETER PLACEMENT UNDER ULTRASOUND AND FLUOROSCOPIC GUIDANCE  TECHNIQUE: The procedure, risks (including but not limited to bleeding, infection, organ damage, pneumothorax), benefits, and alternatives were explained to the patient. Questions regarding the procedure were encouraged and answered. The patient understands and consents to the procedure. Patency of the right IJ vein was confirmed with ultrasound with image documentation. An appropriate skin site was determined. Skin site was marked. Region was prepped using maximum barrier technique including cap and mask, sterile gown, sterile gloves, large sterile sheet, and Chlorhexidine as cutaneous antisepsis. The region was infiltrated locally with 1% lidocaine. Under real-time ultrasound guidance, the right IJ vein was accessed with a 21 gauge needle; the needle tip within the vein was confirmed with ultrasound image documentation. The needle exchanged over a 018 guidewire for vascular dilator which allowed advancement of a 20 cm Mahurkar catheter. This was positioned with the tip at the cavoatrial junction. Spot chest radiograph shows good positioning and no pneumothorax. Catheter was flushed and sutured externally with 0-Prolene sutures. Patient tolerated the procedure well. FLUOROSCOPY TIME:  Less than 6 seconds COMPLICATIONS: COMPLICATIONS none IMPRESSION: 1. Technically successful right IJ Mahurkar catheter placement. Electronically Signed   By: Lucrezia Europe M.D.   On: 01/18/2017 12:53   Dg Chest Port 1 View  Result Date: 01/20/2017 CLINICAL DATA:  Status post dialysis catheter placement EXAM: PORTABLE CHEST 1 VIEW COMPARISON:  01/16/2017 FINDINGS: Cardiac shadow remains enlarged. New dialysis catheter is noted in satisfactory position. The overall inspiratory effort is poor although no pneumothorax is seen. Mild vascular congestion is again noted. Left basilar atelectatic changes are seen. IMPRESSION: No pneumothorax following dialysis catheter placement. Mild left basilar atelectasis. Electronically Signed   By: Inez Catalina M.D.   On: 01/20/2017 15:28    Lab Data:  CBC: Recent Labs  Lab 01/20/17 0214 01/21/17 0305 01/22/17 1324 01/24/17 1408 01/25/17 0303  WBC 9.5 12.7* 10.3 7.4 7.9  HGB 11.1* 11.4* 9.3* 9.4* 10.0*  HCT 33.3* 35.3* 28.1* 27.4* 29.5*  MCV 84.5 86.9 86.2 85.6 85.0  PLT 272 254 224 219 099   Basic Metabolic Panel: Recent Labs  Lab 01/18/17 1650  01/20/17 0214 01/21/17 0305 01/22/17 1324 01/24/17 1408 01/25/17 0303  NA  --    < > 137  136 135 135 133*  K  --    < > 3.8 4.8 3.9 4.1 3.9  CL  --    < > 103 100* 103 99* 97*  CO2  --    < > 25 27 23 26 26   GLUCOSE  --    < > 117* 131* 104* 129* 140*  BUN  --    < > 83* 81* 71* 62* 69*  CREATININE  --    < > 4.96* 5.12* 4.69* 4.79* 4.98*  CALCIUM  --    < > 7.2* 7.3* 7.1* 7.1* 7.1*  PHOS 5.9*  --   --   --  3.6  --   --    < > = values in this interval not displayed.   GFR: Estimated Creatinine Clearance: 19.5 mL/min (A) (by C-G formula based on SCr of 4.98 mg/dL (H)). Liver Function Tests: Recent Labs  Lab 01/20/17 0214 01/22/17 1324 01/24/17 1408  AST 12*  --  28  ALT 24  --  32  ALKPHOS 45  --  41  BILITOT 0.7  --  0.6  PROT 5.1*  --  4.9*  ALBUMIN 1.9* 1.7* 1.7*   No results for input(s): LIPASE, AMYLASE in the last 168 hours. No results for input(s): AMMONIA in the last 168 hours. Coagulation Profile: No results for input(s): INR, PROTIME in the last 168 hours. Cardiac Enzymes: No results for input(s): CKTOTAL, CKMB, CKMBINDEX, TROPONINI in the last 168 hours. BNP (last 3 results) No results for input(s): PROBNP in the last 8760 hours. HbA1C: No results for input(s): HGBA1C in the last 72 hours. CBG: Recent Labs  Lab 01/19/17 0856 01/20/17 0914  GLUCAP 99 87   Lipid Profile: No results for input(s): CHOL, HDL, LDLCALC, TRIG, CHOLHDL, LDLDIRECT in the last 72 hours. Thyroid Function Tests: No results for input(s): TSH, T4TOTAL, FREET4, T3FREE, THYROIDAB in the last 72 hours. Anemia Panel: No  results for input(s): VITAMINB12, FOLATE, FERRITIN, TIBC, IRON, RETICCTPCT in the last 72 hours. Urine analysis:    Component Value Date/Time   COLORURINE YELLOW 01/16/2017 1420   APPEARANCEUR CLEAR 01/16/2017 1420   LABSPEC 1.013 01/16/2017 1420   PHURINE 5.0 01/16/2017 1420   GLUCOSEU NEGATIVE 01/16/2017 1420   HGBUR SMALL (A) 01/16/2017 1420   BILIRUBINUR NEGATIVE 01/16/2017 1420   KETONESUR NEGATIVE 01/16/2017 1420   PROTEINUR 100 (A) 01/16/2017 1420   UROBILINOGEN 0.2 02/26/2015 1028   NITRITE NEGATIVE 01/16/2017 1420   LEUKOCYTESUR NEGATIVE 01/16/2017 1420     Kayleen Memos M.D. [facility] 01/25/2017, 2:06 PM  Pager: [phone] Between 7am to 7pm - call Pager - [phone]  After 7pm go to [email or web address] - password [email or web address]  Call night coverage person covering after 7pm

## 2017-01-25 NOTE — Progress Notes (Signed)
Pt refusing cath lab scheduled for tomorrow. MD notified of pts decision.

## 2017-01-25 NOTE — Progress Notes (Signed)
Pt. Had 30 beat run of vtach. MD notified. Going to come check on patient.

## 2017-01-25 NOTE — Care Management Note (Addendum)
Case Management Note  Patient Details  Name: Jason [name] MRN: [id number] Date of Birth: January 04, 1961  Subjective/Objective:     Pt admitted with cellulitis, hypertensive emergency, worsening RF  and SOB.                Action/Plan:  PTA from home.  ESRD not yet on outpt HD - however it has been determined that pt will now require perm HD - pt will need to be clipped.  Pt will need appt at [facility] and possibly [place] at discharge.  CM will continue to follow for discharge needs   Expected Discharge Date:  (unknown)               Expected Discharge Plan:  Home/Self Care  In-House Referral:  Clinical Social Work  Discharge planning Services  CM Consult  Post Acute Care Choice:    Choice offered to:  Patient  DME Arranged:    DME Agency:     HH Arranged:    [place] Agency:     Status of Service:  In process, will continue to follow  If discussed at Long Length of Stay Meetings, dates discussed:    Additional Comments: 01/25/2017  [facility] received approval for [place] for pt.    Pt in agreement with Pt has appt with [facility] 1/23 at 1pm - information shared with pt and placed on AVS.    CM discussed recommendation of HHRN for wound care with pt, pt in agreement.  Pt informed CM that he has already secured a room at the [facility] located at [place] - room 107.  CM requested HHRN order for pt and informed [facility] of pending charity need.  01/24/17 CSW reviewed case with AD and will document determinations in Epic  Attending informed that housing resources were offered to pt and pt refused per CSW,  Both CM and CSW attempted to discussed impending discharge with pt - pt acknowledged that he was pending discharge however he continued to communicate that he will come up with his own plan.  Per attending pt is not appropriate for discharge at this time due to blood pressure medications changes today - plan is for pt to have HD tomorrow at [facility].  Physician  Advisor informed.     CM informed by bedside nurse that pt is now stating he is homeless - daughter concurs ( CM was told pt by pt that he was from home independent on initial assessement).  Per bedside nurse pt continues to be independently mobilizing here at [facility] - no deficits noted. Pt is now clipped to [facility].   CSW consulted.  CM text paged MD requested discharge consideration   01/21/17 Pt is now s/p fistula placement with daily HD assessments.  Pt will need to be CLIPPED.   01/20/17 Pt refusing SNF (CSW received consult) and per note also refusing perm access for HD, however will allow daily HD here in house for now. CM requested Palliative Consult - attending has decided to persue psych consult first.   CM will continue to follow for discharge needs Maryclare Labrador, RN 01/25/2017, 12:10 PM

## 2017-01-25 NOTE — Discharge Summary (Addendum)
Discharge Summary  Jason [name] VXY:[id number] DOB: [birth year]  PCP: Wandra Mannan, NP  Admit date: 01/16/2017 Discharge date: 01/25/2017  Time spent: 25 minutes   UPDATE: 1522: PATIENT HAD 31 BEAT RUN OF VTACH REPORTED BY RN. ASYMPTOMATIC DURING EPISODE. WILL OBTAIN 12 LEAD EKG, HOLD DISCHARGE, AND MONITOR CLOSELY OVERNIGHT. INFORMED CARDIOLOGY OF THIS CHANGE 01/25/17. THEY WILL EVALUATE THE PATIENT TO DETERMINE IF HE WOULD NEED ANY FURTHER TESTING OR INTERVENTION.  Recommendations for Outpatient Follow-up:  1. Follow up with nephrology post hospitalization 2. Continue hemodialysis TTS 3. Follow up with your PCP within a week 4. Continue wound care for lower extremities 5. Fall precaution  Discharge Diagnoses:  Active Hospital Problems   Diagnosis Date Noted  . Evaluation by psychiatric service required 01/19/2017  . Hypertensive emergency 01/16/2017  . Elevated troponin 01/16/2017  . Acute CHF (congestive heart failure) ([place]) 01/16/2017  . Cellulitis 01/16/2017  . Chronic kidney disease 02/27/2015  . Essential hypertension 02/26/2015  . Acute kidney injury ([place]) 01/29/2015    Resolved Hospital Problems  No resolved problems to display.    Discharge Condition: stable  Diet recommendation: Renal dialysis diet   Vitals:   01/25/17 1054 01/25/17 1225  BP: (!) 149/97 (!) 140/99  Pulse: 73   Resp: (!) 24   Temp: 98.3 F (36.8 C)   SpO2: 98%     History of present illness:  [name] a 57 y.o.malewith medical history significant ofhypertension, advanced chronic kidney disease lost to follow up, anemia, inguinal hernia, medical non compliance presenting with worsening shortness of breath, lower extremity edema, and altered mental status.in the ED, hypertensive emergency, fluid overload with worsening renal function, heart failure, acute encephalopathy and bilateral chronic venous stasis with stasis dermatitis/cellulitis.New ESRD started dialysis 01/19/17. Post R  brachial cephalic fistula creation 01/20/17. Follow up with vascular surgery in 6 weeks . Do not stick fistula for at least 12 weeks. New TDC placed on 01/20/17. HD spot [facility].  01/23/17: Patient refuses chemical DVT ppx heparin sq. 01/24/17: Patient refuses hydralazine po stating that it makes him cough. Hypertensive medications adjusted to maintain compliance. Cardiology also following.  01/24/17: Persistent b/l LE weeping despite fluid removal from dialysis sessions. Wound care specialist consulted for recommendations and to educate patient on how to take care of bilateral lower extremity wounds. Patient denies dyspnea, palpitations or chest pain.   Consulted nephrology about discharge to his motel. They are agreeable. The patient will need to keep his dialysis sessions TTS. Patient will need to follow up with nephrology and his PCP. Will also need to continue wound care in the outpatient setting.  Hospital Course:  Principal Problem:   Evaluation by psychiatric service required Active Problems:   Acute kidney injury [facility])   Essential hypertension   Chronic kidney disease   Hypertensive emergency   Elevated troponin   Acute CHF (congestive heart failure) (H[facility])   Cellulitis  Acute hypoxic respiratory failure secondary to acute on chronic HFREF 35-40%, flash pulmonary edema, hypervolemia due to new ESRD -2D echo showed EF of 35-40% with moderate diffuse hypokinesis, grade 2 diastolic dysfunction -Cardiology following -Hemodialysis helping with peripheral edema and acute CHF exacerbation -continue hemodyalysis TTS  Hypertensive emergency with flash pulmonary edema, improving on HD - BP 224/171 at the time of admission  - BP currently improving - continue amlodipine, Coreg, imdur - patient refuses hydralazine stating it makes him cough - clonidine 0.1 mg daily - will continue to improve on hemodialysis  - continue dialysis to  improve BP and fluid status   Acute  kidney injury ([place]) on advanced chronic kidney disease, stage V, progressed to new ESRD POD # 5 post right brachial cephalic fistula creation (01/20/17) -Nephrology following -Vascular surgery consulted for permanent access -HD spot [facility] -HD 01/22/17, 01/24/17  Bilateral chronic venous stasis with stasis dermatitis with suspected super imposed cellulitis -CT with severe soft tissue edema -Wound care specialist consulted for recommendations and for patient education -Continue IV vancomycin day # 8. Managed by pharmacy -CBC, BMET pending, blood cultures negative  Anemia of chronic disease in the setting of ESRD on HD -hg 9 from 11 -aranesp per nephrology -no overt sign of bleeding  Severe protein calorie malnutrition -albumin 1.7 -pro stat oral supplement -continue oral supplement   Elevated troponin in the setting of hypertensive emergency -Troponin peaked at 0.43 and trended down suspect due to ESRD and demand ischemia -2D echo with EF of 35-40%  (01/17/17) with moderate diffuse hypokinesis  -Cardiology following -Repeat echo in 3 months, if LVEF not improved cardiology will assess for ischemia  Left knee pain, resolved -Ruled out acute gout -ESR normal -CRP, uric acid elevated in the setting of ESRD  Medical non compliance -Non compliance with medical management  -Psychiatry consulted and determined patient has capacity.  Procedures:  2D echo: Systolic function was moderately reduced.The estimated ejection fraction was in the range of 35% to 40%.Moderate diffuse hypokinesis. Features are consistent with a pseudonormal left ventricular filling pattern, with concomitant abnormal relaxation and increased filling pressure (grade 2 diastolic dysfunction)  ABI: Normal   Consultants:   Nephrology Vascular surgery Interventional radiology Wound care specialist   Discharge Exam: BP (!) 140/99   Pulse 73   Temp 98.3 F (36.8 C) (Oral)    Resp (!) 24   Ht _0  (1.88 m)   Wt 97.5 kg (214 lb 15.2 oz)   SpO2 98%   BMI 27.60 kg/m     General: Alert and oriented x 3, NAD  Eyes: pupils are round and reactive to light  HEENT:  Mucous membranes are dry no erythema or exudates  Cardiovascular: S1 S2 clear, RRR   Respiratory: Bibasilar crackles  Gastrointestinal: Soft, nontender, nondistended, + bowel sounds  Ext: bilateral lower extremity edema up to tights with drainage to bilateral lower extremity  Neuro: no new deficits  Musculoskeletal: No digital cyanosis, clubbing. 3+LE edema with oozing bilaterally.  Skin: Erythema, persistent drainage on b/l LE  Psych: Normal affect and demeanor, alert and oriented x3   Discharge Instructions You were cared for by a hospitalist during your hospital stay. If you have any questions about your discharge medications or the care you received while you were in the hospital after you are discharged, you can call the unit and asked to speak with the hospitalist on call if the hospitalist that took care of you is not available. Once you are discharged, your primary care physician will handle any further medical issues. Please note that NO REFILLS for any discharge medications will be authorized once you are discharged, as it is imperative that you return to your primary care physician (or establish a relationship with a primary care physician if you do not have one) for your aftercare needs so that they can reassess your need for medications and monitor your lab values.   Allergies as of 01/25/2017      Reactions   Lisinopril Cough      Medication List    STOP taking these medications  hydrALAZINE 100 MG tablet Commonly known as:  APRESOLINE   LORazepam 1 MG tablet Commonly known as:  ATIVAN     TAKE these medications   amLODipine 10 MG tablet Commonly known as:  NORVASC Take 1 tablet (10 mg total) by mouth daily. Start taking on:  01/26/2017 What changed:  when to take  this   calcium acetate 667 MG capsule Commonly known as:  PHOSLO Take 1 capsule (667 mg total) by mouth 3 (three) times daily with meals.   carvedilol 25 MG tablet Commonly known as:  COREG Take 1 tablet (25 mg total) by mouth 2 (two) times daily with a meal. What changed:    medication strength  how much to take   cloNIDine 0.1 MG tablet Commonly known as:  CATAPRES Take 1 tablet (0.1 mg total) by mouth 2 (two) times daily. What changed:    medication strength  how much to take   feeding supplement (NEPRO CARB STEADY) Liqd Take 237 mLs by mouth 3 (three) times daily with meals.   isosorbide mononitrate 60 MG 24 hr tablet Commonly known as:  IMDUR Take 1 tablet (60 mg total) by mouth daily. Start taking on:  01/26/2017   multivitamin with minerals Tabs tablet Take 1 tablet by mouth daily.      Allergies  Allergen Reactions  . Lisinopril Cough   Follow-up Information    Wandra Mannan, NP Follow up.   Specialty:  Family Medicine Why:  Please follow up with your doctor about medication assistance. Contact information: [place] [address] [phone]        Angelia Mould, MD Follow up in 5 week(s).   Specialties:  Vascular Surgery, Cardiology Why:  Office will call the patient Contact information: [place] [address] [phone]        Lelon Perla, MD Follow up.   Specialty:  Cardiology Contact information: [address] [place] [address] [phone]            The results of significant diagnostics from this hospitalization (including imaging, microbiology, ancillary and laboratory) are listed below for reference.    Significant Diagnostic Studies: Dg Chest 2 View  Result Date: 01/16/2017 CLINICAL DATA:  Shortness of breath, BILATERAL lower extremity edema with weeping wounds, on oxygen, history hypertension, former smoker EXAM: CHEST  2 VIEW COMPARISON:  Non  FINDINGS: Enlargement of cardiac silhouette with pulmonary vascular congestion. Rotated to the RIGHT. Subsegmental atelectasis LEFT mid lung and at both bases greater on LEFT. Minimal LEFT pleural effusion. No definite acute infiltrate, pleural effusion or pneumothorax. IMPRESSION: Enlargement of cardiac silhouette with vascular congestion. Scattered atelectasis greater on LEFT with minimal LEFT pleural effusion. Electronically Signed   By: Lavonia Dana M.D.   On: 01/16/2017 13:09   Ct Head Wo Contrast  Result Date: 01/16/2017 CLINICAL DATA:  Headache hypertension EXAM: CT HEAD WITHOUT CONTRAST TECHNIQUE: Contiguous axial images were obtained from the base of the skull through the vertex without intravenous contrast. COMPARISON:  None. FINDINGS: Motion degraded images limit assessment. Brain: No evidence of acute large vascular territory infarction, hemorrhage, hydrocephalus, extra-axial collection or mass lesion/mass effect. No intraventricular hemorrhage. No effacement of the basal cisterns or fourth ventricle. Hyperdensity along the falx likely related to falcine calcifications, without eccentric or focal thickening to suggest hemorrhage. Minimal periventricular white matter hypodensities consistent with small vessel ischemic disease. Vascular: No hyperdense vessel or unexpected calcification. Skull: Negative for fracture or focal lesion. Sinuses/Orbits: No  acute finding. Other: None IMPRESSION: Limited by motion artifacts. Minimal small vessel ischemic disease. No acute intracranial abnormality. Electronically Signed   By: Ashley Royalty M.D.   On: 01/16/2017 18:09   US Renal  Result Date: 01/16/2017 CLINICAL DATA:  Acute renal failure.  History of hypertension. EXAM: RENAL / URINARY TRACT ULTRASOUND COMPLETE COMPARISON:  01/29/2015 FINDINGS: Right Kidney: Length: 9.9 cm. Echogenic renal parenchyma. No focal mass or hydronephrosis. Left Kidney: Length: 9.2 cm. Echogenic renal parenchyma. No focal mass or  hydronephrosis. Bladder: Small amount of debris is noted within the bladder. Additional: Bilateral pleural effusions are noted. IMPRESSION: 1. Echogenic renal parenchyma bilaterally, compatible with the history of renal failure. No hydronephrosis. 2. Bilateral pleural effusions. Electronically Signed   By: Nolon Nations M.D.   On: 01/16/2017 18:47   Ct Tibia Fibula Left Wo Contrast  Result Date: 01/16/2017 CLINICAL DATA:  Cellulitis.  Evaluate for osteomyelitis. EXAM: CT OF THE LOWER LEFT EXTREMITY WITHOUT CONTRAST TECHNIQUE: Multidetector CT imaging of the lower left extremity was performed according to the standard protocol. COMPARISON:  None. FINDINGS: Bones/Joint/Cartilage No fracture or dislocation. Normal alignment. No joint effusion. Moderate medial femorotibial compartment joint space narrowing with marginal osteophytes and subchondral sclerosis. Mild lateral femorotibial compartment joint space narrowing with small marginal osteophytes. Small patellofemoral compartment marginal osteophytes. Subchondral cystic changes in the medial corner of the talar dome. Small anterior tibial plafond marginal osteophyte. No aggressive osseous lesion. No periosteal reaction or bone destruction. Ligaments Ligaments are suboptimally evaluated by CT. Muscles and Tendons Muscles are normal.  No muscle atrophy. Soft tissue Severe soft tissue edema within the subcutaneous fat of the left lower leg. Severe soft tissue edema within the subcutaneous fat of the visualized portions of the right lower leg. No soft tissue mass. IMPRESSION: 1. Severe soft tissue edema within the subcutaneous fat of the left lower leg. Severe soft tissue edema within the subcutaneous fat of the visualized portions of the right lower leg. Differential considerations include severe cellulitis versus venous stasis. Electronically Signed   By: Kathreen Devoid   On: 01/16/2017 17:32   Ir Fluoro Guide Cv Line Right  Result Date: 01/18/2017 CLINICAL DATA:   Renal failure, needs venous access for hemodialysis EXAM: EXAM RIGHT IJ CATHETER PLACEMENT UNDER ULTRASOUND AND FLUOROSCOPIC GUIDANCE TECHNIQUE: The procedure, risks (including but not limited to bleeding, infection, organ damage, pneumothorax), benefits, and alternatives were explained to the patient. Questions regarding the procedure were encouraged and answered. The patient understands and consents to the procedure. Patency of the right IJ vein was confirmed with ultrasound with image documentation. An appropriate skin site was determined. Skin site was marked. Region was prepped using maximum barrier technique including cap and mask, sterile gown, sterile gloves, large sterile sheet, and Chlorhexidine as cutaneous antisepsis. The region was infiltrated locally with 1% lidocaine. Under real-time ultrasound guidance, the right IJ vein was accessed with a 21 gauge needle; the needle tip within the vein was confirmed with ultrasound image documentation. The needle exchanged over a 018 guidewire for vascular dilator which allowed advancement of a 20 cm Mahurkar catheter. This was positioned with the tip at the cavoatrial junction. Spot chest radiograph shows good positioning and no pneumothorax. Catheter was flushed and sutured externally with 0-Prolene sutures. Patient tolerated the procedure well. FLUOROSCOPY TIME:  Less than 6 seconds COMPLICATIONS: COMPLICATIONS none IMPRESSION: 1. Technically successful right IJ Mahurkar catheter placement. Electronically Signed   By: Lucrezia Europe M.D.   On: 01/18/2017 12:53   Ir US  Guide Vasc Access Right  Result Date: 01/18/2017 CLINICAL DATA:  Renal failure, needs venous access for hemodialysis EXAM: EXAM RIGHT IJ CATHETER PLACEMENT UNDER ULTRASOUND AND FLUOROSCOPIC GUIDANCE TECHNIQUE: The procedure, risks (including but not limited to bleeding, infection, organ damage, pneumothorax), benefits, and alternatives were explained to the patient. Questions regarding the  procedure were encouraged and answered. The patient understands and consents to the procedure. Patency of the right IJ vein was confirmed with ultrasound with image documentation. An appropriate skin site was determined. Skin site was marked. Region was prepped using maximum barrier technique including cap and mask, sterile gown, sterile gloves, large sterile sheet, and Chlorhexidine as cutaneous antisepsis. The region was infiltrated locally with 1% lidocaine. Under real-time ultrasound guidance, the right IJ vein was accessed with a 21 gauge needle; the needle tip within the vein was confirmed with ultrasound image documentation. The needle exchanged over a 018 guidewire for vascular dilator which allowed advancement of a 20 cm Mahurkar catheter. This was positioned with the tip at the cavoatrial junction. Spot chest radiograph shows good positioning and no pneumothorax. Catheter was flushed and sutured externally with 0-Prolene sutures. Patient tolerated the procedure well. FLUOROSCOPY TIME:  Less than 6 seconds COMPLICATIONS: COMPLICATIONS none IMPRESSION: 1. Technically successful right IJ Mahurkar catheter placement. Electronically Signed   By: Lucrezia Europe M.D.   On: 01/18/2017 12:53   Dg Chest Port 1 View  Result Date: 01/20/2017 CLINICAL DATA:  Status post dialysis catheter placement EXAM: PORTABLE CHEST 1 VIEW COMPARISON:  01/16/2017 FINDINGS: Cardiac shadow remains enlarged. New dialysis catheter is noted in satisfactory position. The overall inspiratory effort is poor although no pneumothorax is seen. Mild vascular congestion is again noted. Left basilar atelectatic changes are seen. IMPRESSION: No pneumothorax following dialysis catheter placement. Mild left basilar atelectasis. Electronically Signed   By: Inez Catalina M.D.   On: 01/20/2017 15:28    Microbiology: Recent Results (from the past 240 hour(s))  Culture, blood (routine x 2)     Status: None   Collection Time: 01/16/17  5:11 PM    Result Value Ref Range Status   Specimen Description BLOOD RIGHT ANTECUBITAL  Final   Special Requests   Final    BOTTLES DRAWN AEROBIC AND ANAEROBIC Blood Culture adequate volume   Culture   Final    NO GROWTH 5 DAYS Performed at [facility], [address] [place], [place] [address]    Report Status 01/22/2017 FINAL  Final  Culture, blood (routine x 2)     Status: None   Collection Time: 01/16/17  7:44 PM  Result Value Ref Range Status   Specimen Description BLOOD RIGHT ANTECUBITAL  Final   Special Requests   Final    BOTTLES DRAWN AEROBIC AND ANAEROBIC Blood Culture adequate volume   Culture   Final    NO GROWTH 5 DAYS Performed at [facility], [place] [address], [place], [place] [address]    Report Status 01/22/2017 FINAL  Final  MRSA PCR Screening     Status: None   Collection Time: 01/17/17  9:31 PM  Result Value Ref Range Status   MRSA by PCR NEGATIVE NEGATIVE Final    Comment:        The GeneXpert MRSA Assay (FDA approved for NASAL specimens only), is one component of a comprehensive MRSA colonization surveillance program. It is not intended to diagnose MRSA infection nor to guide or monitor treatment for MRSA infections.      Labs: Basic  Metabolic Panel: Recent Labs  Lab 01/18/17 1650  01/20/17 0214 01/21/17 0305 01/22/17 1324 01/24/17 1408 01/25/17 0303  NA  --    < > 137 136 135 135 133*  K  --    < > 3.8 4.8 3.9 4.1 3.9  CL  --    < > 103 100* 103 99* 97*  CO2  --    < > _0 GLUCOSE  --    < > 117* 131* 104* 129* 140*  BUN  --    < > 83* 81* 71* 62* 69*  CREATININE  --    < > 4.96* 5.12* 4.69* 4.79* 4.98*  CALCIUM  --    < > 7.2* 7.3* 7.1* 7.1* 7.1*  PHOS 5.9*  --   --   --  3.6  --   --    < > = values in this interval not displayed.   Liver Function Tests: Recent Labs  Lab 01/20/17 0214 01/22/17 1324 01/24/17 1408  AST 12*  --  28  ALT 24  --  32  ALKPHOS 45  --  41  BILITOT 0.7  --  0.6  PROT 5.1*  --  4.9*   ALBUMIN 1.9* 1.7* 1.7*   No results for input(s): LIPASE, AMYLASE in the last 168 hours. No results for input(s): AMMONIA in the last 168 hours. CBC: Recent Labs  Lab 01/20/17 0214 01/21/17 0305 01/22/17 1324 01/24/17 1408 01/25/17 0303  WBC 9.5 12.7* 10.3 7.4 7.9  HGB 11.1* 11.4* 9.3* 9.4* 10.0*  HCT 33.3* 35.3* 28.1* 27.4* 29.5*  MCV 84.5 86.9 86.2 85.6 85.0  PLT 272 254 224 219 223   Cardiac Enzymes: No results for input(s): CKTOTAL, CKMB, CKMBINDEX, TROPONINI in the last 168 hours. BNP: BNP (last 3 results) Recent Labs    01/16/17 1249  BNP >4,500.0*    ProBNP (last 3 results) No results for input(s): PROBNP in the last 8760 hours.  CBG: Recent Labs  Lab 01/19/17 0856 01/20/17 0914  GLUCAP 99 87       Signed:  Kayleen Memos, MD [facility] 01/25/2017, 2:46 PM

## 2017-01-25 NOTE — Progress Notes (Signed)
Nutrition Follow-up  DOCUMENTATION CODES:   Not applicable  INTERVENTION:   -D/c 30 ml Prostat TID, due to poor acceptance -Decrease Nepro Shake po to BID, each supplement provides 425 kcal and 19 grams protein  NUTRITION DIAGNOSIS:   Increased nutrient needs related to wound healing as evidenced by estimated needs.  Ongoing  GOAL:   Patient will meet greater than or equal to 90% of their needs  Progressing  MONITOR:   PO intake, Supplement acceptance, Labs, Weight trends, Skin, I & O's  REASON FOR ASSESSMENT:   Consult Wound healing  ASSESSMENT:   Jason [name] is a 57 y.o. male with medical history significant of hypertension, chronic kidney disease, anemia, inguinal hernia presenting with worsening shortness of breath, lower extremity edema, and altered mental status.  1/10- TDC placed  Spoke with pt at bedside, who reports good appetite. PTA he shares that he was consuming most of his nutrition through protein shakes (pt revealed that he has two nutritional shake powders with him-  G[employer] Organic Sugar Free Protein Powder (130 kcals and 22 grams protein per scoop) and Green Vibrace Powder (45 kcals and 2 grams protein per scoop). Pt shares that he mainly drinks protein shakes due to finances and to avoid unhealthy foods at truck stops.   Pt endorses wt loss due to initiation of HD. Of note, pt was much less edematous than from previous admissions.   Per MD notes, pt was scheduled to discharge today, however, discharge cancelled due to a-fib.   Pt was very sleepy at time of visit and went in and out of sleep at time of visit. Will hold off on further education until pt is more alert.   Labs reviewed: Na: 133.   NUTRITION - FOCUSED PHYSICAL EXAM:    Most Recent Value  Orbital Region  No depletion  Upper Arm Region  No depletion  Thoracic and Lumbar Region  No depletion  Buccal Region  No depletion  Temple Region  No depletion  Clavicle Bone Region  No  depletion  Clavicle and Acromion Bone Region  No depletion  Scapular Bone Region  No depletion  Dorsal Hand  No depletion  Patellar Region  No depletion  Anterior Thigh Region  No depletion  Posterior Calf Region  No depletion  Edema (RD Assessment)  Moderate  Hair  Reviewed  Eyes  Reviewed  Mouth  Reviewed  Skin  Reviewed  Nails  Reviewed       Diet Order:  Diet renal with fluid restriction Fluid restriction: 1200 mL Fluid; Room service appropriate? Yes; Fluid consistency: Thin  EDUCATION NEEDS:   No education needs have been identified at this time  Skin:  Skin Assessment: Skin Integrity Issues: Skin Integrity Issues:: Incisions Incisions: rt arm (HD fistula) Other: RLE full thickness wounds, LLE full and partial thickness wounds  Last BM:  01/23/17  Height:   Ht Readings from Last 1 Encounters:  01/17/17 6\' 2"  (1.88 m)    Weight:   Wt Readings from Last 1 Encounters:  01/25/17 214 lb 15.2 oz (97.5 kg)    Ideal Body Weight:  86.4 kg  BMI:  Body mass index is 27.6 kg/m.  Estimated Nutritional Needs:   Kcal:  2200-2400  Protein:  130-145 grams  Fluid:  per MD    Jason [name] A. Jimmye Norman, RD, LDN, CDE Pager: [phone] After hours Pager: [phone]

## 2017-01-25 NOTE — Progress Notes (Signed)
Progress Note  Patient Name: Jason [name] Date of Encounter: 01/25/2017  Primary Cardiologist: Kirk Ruths, MD   Subjective   Patient upset about losing ride home daughter was here but left Telemetry has had multiple long runs of NSVT some as long as 20-30 beats Asymptomatic.   Inpatient Medications    Scheduled Meds: . amLODipine  10 mg Oral Daily  . calcium acetate  667 mg Oral TID WC  . carvedilol  25 mg Oral BID WC  . cloNIDine  0.1 mg Oral BID  . darbepoetin (ARANESP) injection - DIALYSIS  60 mcg Intravenous Q Tue-HD  . [START ON 01/26/2017] feeding supplement (NEPRO CARB STEADY)  237 mL Oral BID BM  . heparin injection (subcutaneous)  5,000 Units Subcutaneous Q8H  . isosorbide mononitrate  60 mg Oral Daily   Continuous Infusions: . sodium chloride Stopped (01/20/17 1508)   PRN Meds: acetaminophen, heparin, HYDROmorphone (DILAUDID) injection, labetalol, oxyCODONE-acetaminophen   Vital Signs    Vitals:   01/25/17 1054 01/25/17 1225 01/25/17 1624 01/25/17 1738  BP: (!) 149/97 (!) 140/99 128/83   Pulse: 73  69 71  Resp: (!) 24  (!) 27   Temp: 98.3 F (36.8 C)  98 F (36.7 C)   TempSrc: Oral  Oral   SpO2: 98%  100%   Weight: 214 lb 15.2 oz (97.5 kg)     Height:        Intake/Output Summary (Last 24 hours) at 01/25/2017 1746 Last data filed at 01/25/2017 1054 Gross per 24 hour  Intake 360 ml  Output 4350 ml  Net -3990 ml   Filed Weights   01/22/17 1713 01/25/17 0645 01/25/17 1054  Weight: 214 lb 15.2 oz (97.5 kg) 224 lb 6.9 oz (101.8 kg) 214 lb 15.2 oz (97.5 kg)    Telemetry    SR 70;s multiple salvo's of NSVT  - Personally Reviewed  ECG    SR rate 68 normal QT non specific ST changes  - Personally Reviewed  Physical Exam  Chronically ill black male  GEN: No acute distress.   Neck: No JVD dialysis catheter right subclavian  Cardiac: RRR, no murmurs, rubs, or gallops.  Respiratory:  coughing with rhonchi  GI: Soft, nontender, non-distended    MS: No edema; No deformity. Neuro:  Nonfocal  Psych: Normal affect   Labs    Chemistry Recent Labs  Lab 01/20/17 0214  01/22/17 1324 01/24/17 1408 01/25/17 0303  NA 137   < > 135 135 133*  K 3.8   < > 3.9 4.1 3.9  CL 103   < > 103 99* 97*  CO2 25   < > 23 26 26   GLUCOSE 117*   < > 104* 129* 140*  BUN 83*   < > 71* 62* 69*  CREATININE 4.96*   < > 4.69* 4.79* 4.98*  CALCIUM 7.2*   < > 7.1* 7.1* 7.1*  PROT 5.1*  --   --  4.9*  --   ALBUMIN 1.9*  --  1.7* 1.7*  --   AST 12*  --   --  28  --   ALT 24  --   --  32  --   ALKPHOS 45  --   --  41  --   BILITOT 0.7  --   --  0.6  --   GFRNONAA 12*   < > 13* 12* 12*  GFRAA 14*   < > 15* 14* 14*  ANIONGAP 9   < >  9 10 10    < > = values in this interval not displayed.     Hematology Recent Labs  Lab 01/22/17 1324 01/24/17 1408 01/25/17 0303  WBC 10.3 7.4 7.9  RBC 3.26* 3.20* 3.47*  HGB 9.3* 9.4* 10.0*  HCT 28.1* 27.4* 29.5*  MCV 86.2 85.6 85.0  MCH 28.5 29.4 28.8  MCHC 33.1 34.3 33.9  RDW 13.4 13.7 13.5  PLT 224 219 223    Cardiac EnzymesNo results for input(s): TROPONINI in the last 168 hours. No results for input(s): TROPIPOC in the last 168 hours.   BNPNo results for input(s): BNP, PROBNP in the last 168 hours.   DDimer No results for input(s): DDIMER in the last 168 hours.   Radiology    No results found.  Cardiac Studies   Echo EF 35-40% mild MR 29-Jan-2017  Patient Profile     57 y.o. male noncompliant with HTN, CKD , anemia Admitted with AMS and volume overload From unRx CRF as well as HTN urgency. Troponin on admission peaked at .43 Cr 7.0 And started on dialysis.   Assessment & Plan    DMC:  No ACE due to renal failure on coreg and nitrates did not tolerate hydralazine with cough  Given non compliance, salvos of NSVT EF 35-40% and elevated troponin on admission favor Right and left cath in am to r/o CAD before d/c. Per Dr [name] plan would then be to Rx Medicine 3 months and repeat echo to see if  he is a candidate for AICD. Explained this to  Patient regarding DCM and risk of sudden death He does not like being in hospital and does Not appear to be interested in close f/u However currently he is willing to have cath in am.  Risks discussed   NSVT: in setting of DCM on coreg r/o CAD with cath in am. No symptoms or syncope no  Reason to consider life vest at this time  CRF: on dialysis now will need fistual in future Rx anemia check Mag K ok this am  HTN: improved with norvasc, clonidine and core   Orders placed and on cath schedule for am   For questions or updates, please contact [place] Please consult [email or web address] for contact info under Cardiology/STEMI.      Signed, Jenkins Rouge, MD  01/25/2017, 5:46 PM

## 2017-01-26 ENCOUNTER — Encounter (HOSPITAL_COMMUNITY)
Admission: EM | Disposition: A | Discharge status: Home / Self Care | Payer: Self-pay | Source: Home / Self Care | Attending: Internal Medicine

## 2017-01-26 DIAGNOSIS — Z992 Dependence on renal dialysis: Secondary | ICD-10-CM

## 2017-01-26 DIAGNOSIS — N186 End stage renal disease: Secondary | ICD-10-CM

## 2017-01-26 SURGERY — RIGHT/LEFT HEART CATH AND CORONARY ANGIOGRAPHY
Anesthesia: LOCAL

## 2017-01-26 MED ORDER — CLONIDINE HCL 0.1 MG PO TABS: 0.1000 mg | ORAL_TABLET | Freq: Two times a day (BID) | ORAL | 0 refills | Status: DC

## 2017-01-26 MED ORDER — CARVEDILOL 25 MG PO TABS: 25.0000 mg | ORAL_TABLET | Freq: Two times a day (BID) | ORAL | 0 refills | Status: DC

## 2017-01-26 MED ORDER — NEPRO/CARBSTEADY PO LIQD: 237.0000 mL | Freq: Three times a day (TID) | ORAL | 0 refills | Status: DC

## 2017-01-26 MED ORDER — AMLODIPINE BESYLATE 10 MG PO TABS: 10.0000 mg | ORAL_TABLET | Freq: Every day | ORAL | 0 refills | Status: DC

## 2017-01-26 MED ORDER — ISOSORBIDE MONONITRATE ER 60 MG PO TB24: 60.0000 mg | ORAL_TABLET | Freq: Every day | ORAL | 0 refills | Status: DC

## 2017-01-26 MED ORDER — CALCIUM ACETATE (PHOS BINDER) 667 MG PO CAPS: 667.0000 mg | ORAL_CAPSULE | Freq: Three times a day (TID) | ORAL | 0 refills | Status: DC

## 2017-01-26 MED FILL — ?CLONIDINE HCL 0.1 MG TABL: 0.1 | 30 days supply | Qty: 60 | Fill #0

## 2017-01-26 MED FILL — ?CARVEDILOL 25 MG TABLET: 25 | 15 days supply | Qty: 30 | Fill #0

## 2017-01-26 MED FILL — AMLODIPINE BESYLATE 10 MG T: 10 | 30 days supply | Qty: 30 | Fill #0

## 2017-01-26 MED FILL — CALCIUM ACETATE 667 MG CAP: 667 | 30 days supply | Qty: 90 | Fill #0

## 2017-01-26 MED FILL — ISOSORBIDE MN ER 60 MG TAB: 60 | 30 days supply | Qty: 30 | Fill #0

## 2017-01-26 NOTE — Progress Notes (Signed)
Explained and discussed discharge instructions, [facility] [address]. Care notes given on dialysis diet. Pt to be discharge home with daugther when she gets here.

## 2017-01-26 NOTE — Progress Notes (Signed)
daugther here, pt going home via w/c with belongings.

## 2017-01-26 NOTE — Progress Notes (Signed)
Progress Note  Patient Name: Jason [name] Date of Encounter: 01/26/2017  Primary Cardiologist: Kirk Ruths, MD   Subjective   Refusing cath insists on going home no chest pain or dyspnea   Inpatient Medications    Scheduled Meds: . amLODipine  10 mg Oral Daily  . calcium acetate  667 mg Oral TID WC  . carvedilol  25 mg Oral BID WC  . cloNIDine  0.1 mg Oral BID  . darbepoetin (ARANESP) injection - DIALYSIS  60 mcg Intravenous Q Tue-HD  . feeding supplement (NEPRO CARB STEADY)  237 mL Oral BID BM  . heparin injection (subcutaneous)  5,000 Units Subcutaneous Q8H  . isosorbide mononitrate  60 mg Oral Daily   Continuous Infusions: . sodium chloride Stopped (01/20/17 1508)   PRN Meds: acetaminophen, heparin, HYDROmorphone (DILAUDID) injection, labetalol, oxyCODONE-acetaminophen   Vital Signs    Vitals:   01/25/17 1738 01/25/17 1927 01/25/17 2301 01/26/17 0423  BP:  120/78 (!) 142/91 (!) 130/96  Pulse: 71 71    Resp:  (!) 23 17 19   Temp:  98.6 F (37 C) 98.7 F (37.1 C) 98.4 F (36.9 C)  TempSrc:  Oral Oral Oral  SpO2:  98%    Weight:      Height:        Intake/Output Summary (Last 24 hours) at 01/26/2017 0826 Last data filed at 01/26/2017 0426 Gross per 24 hour  Intake 120 ml  Output 4200 ml  Net -4080 ml   Filed Weights   01/22/17 1713 01/25/17 0645 01/25/17 1054  Weight: 214 lb 15.2 oz (97.5 kg) 224 lb 6.9 oz (101.8 kg) 214 lb 15.2 oz (97.5 kg)    Telemetry    SR 70;s multiple salvo's of NSVT  - Personally Reviewed  ECG    SR rate 68 normal QT non specific ST changes  - Personally Reviewed  Physical Exam  Chronically ill black male  Dialysis catheter right subclavian HEENT: normal Neck supple with no adenopathy JVP normal no bruits no thyromegaly Lungs clear with no wheezing and good diaphragmatic motion Heart:  S1/S2 no murmur, no rub, gallop or click PMI normal Abdomen: benighn, BS positve, no tenderness, no AAA no bruit.  No HSM or  HJR Distal pulses intact with no bruits No edema Neuro non-focal Skin warm and dry No muscular weakness   Labs    Chemistry Recent Labs  Lab 01/20/17 0214  01/22/17 1324 01/24/17 1408 01/25/17 0303  NA 137   < > 135 135 133*  K 3.8   < > 3.9 4.1 3.9  CL 103   < > 103 99* 97*  CO2 25   < > 23 26 26   GLUCOSE 117*   < > 104* 129* 140*  BUN 83*   < > 71* 62* 69*  CREATININE 4.96*   < > 4.69* 4.79* 4.98*  CALCIUM 7.2*   < > 7.1* 7.1* 7.1*  PROT 5.1*  --   --  4.9*  --   ALBUMIN 1.9*  --  1.7* 1.7*  --   AST 12*  --   --  28  --   ALT 24  --   --  32  --   ALKPHOS 45  --   --  41  --   BILITOT 0.7  --   --  0.6  --   GFRNONAA 12*   < > 13* 12* 12*  GFRAA 14*   < > 15* 14* 14*  ANIONGAP 9   < >  9 10 10    < > = values in this interval not displayed.     Hematology Recent Labs  Lab 01/22/17 1324 01/24/17 1408 01/25/17 0303  WBC 10.3 7.4 7.9  RBC 3.26* 3.20* 3.47*  HGB 9.3* 9.4* 10.0*  HCT 28.1* 27.4* 29.5*  MCV 86.2 85.6 85.0  MCH 28.5 29.4 28.8  MCHC 33.1 34.3 33.9  RDW 13.4 13.7 13.5  PLT 224 219 223    Cardiac EnzymesNo results for input(s): TROPONINI in the last 168 hours. No results for input(s): TROPIPOC in the last 168 hours.   BNPNo results for input(s): BNP, PROBNP in the last 168 hours.   DDimer No results for input(s): DDIMER in the last 168 hours.   Radiology    No results found.  Cardiac Studies   Echo EF 35-40% mild MR 2017-02-07  Patient Profile     57 y.o. male noncompliant with HTN, CKD , anemia Admitted with AMS and volume overload From unRx CRF as well as HTN urgency. Troponin on admission peaked at .43 Cr 7.0 And started on dialysis.   Assessment & Plan    DMC:  Refuses cath to assess ischemic or not. Volume better and dictated by dialysis No ACE due to CRF no hydralazine intolerant will try to arrange outpatient f/u with [facility] If he will show up   NSVT: in setting of DCM on coreg Refuses cath  No symptoms or syncope no   Reason to consider life vest at this time  CRF: on dialysis now will need fistual in future Rx anemia check Mag K ok this am  HTN: improved with norvasc, clonidine and core   D/C home per primary service suspect he will be non compliant with meds and f/u  For questions or updates, please contact [place] Please consult [email or web address] for contact info under Cardiology/STEMI.      Signed, Jenkins Rouge, MD  01/26/2017, 8:26 AM

## 2017-01-26 NOTE — Progress Notes (Signed)
PROGRESS NOTE    Jason [name]  CXK:[id number] DOB: [birth year] DOA: 01/16/2017 PCP: Wandra Mannan, NP   Brief Narrative: 57 year old male with history of hypertension, ESRD on hemodialysis, anemia of chronic kidney disease, medical noncompliance presented with shortness of breath, lower extremity edema and altered mental status.  In the ER patient was found to have hypertensive emergency.  Patient with a new ESRD started hemodialysis on 01/19/17.  Status post AV fistula creation by vascular.  Patient was discharged yesterday however developed 30 beats of V. tach therefore cardiology held discharge order for possible cardiac cath today.  Patient declined cardiac cath.  He reported feeling well.  Denied nausea vomiting chest pain shortness of breath.  He wanted to go home today.  No change in medication or plan.  Recommend to follow-up with nephrologist, cardiologist and vascular surgeon.  Assessment & Plan:   Principal Problem: Acute respiratory failure with hypoxia due to acute on chronic systolic heart failure, flash pulmonary edema in ESRD patient  Active Problems: ESRD  essential hypertension   Hypertensive emergency   Elevated troponin   Acute CHF (congestive heart failure) (HCC)   Cellulitis   NSVT (nonsustained ventricular tachycardia) ([place])  Patient is clinically stable.  Declined cardiac cath.  No nausea vomiting chest pain or shortness of breath.  Education provided to the patient regarding low-salt diet and continue outpatient hemodialysis.  He verbalized understanding.  Eager to go home today.  Please see discharge summary from yesterday by Dr. Nevada Crane for detail hospital course.  Code Status: Full code  family Communication: No family at bedside Disposition Plan: Discharge home today   Subjective: Seen and examined at bedside.  Refused cardiac cath.  Denied headache, dizziness, nausea vomiting chest pain shortness of breath.  Feeling well.  Objective: Vitals:   01/25/17 1738  01/25/17 1927 01/25/17 2301 01/26/17 0423  BP:  120/78 (!) 142/91 (!) 130/96  Pulse: 71 71    Resp:  (!) 23 17 19   Temp:  98.6 F (37 C) 98.7 F (37.1 C) 98.4 F (36.9 C)  TempSrc:  Oral Oral Oral  SpO2:  98%    Weight:      Height:        Intake/Output Summary (Last 24 hours) at 01/26/2017 1116 Last data filed at 01/26/2017 0426 Gross per 24 hour  Intake 120 ml  Output 200 ml  Net -80 ml   Filed Weights   01/22/17 1713 01/25/17 0645 01/25/17 1054  Weight: 97.5 kg (214 lb 15.2 oz) 101.8 kg (224 lb 6.9 oz) 97.5 kg (214 lb 15.2 oz)    Examination:  General exam: Appears calm and comfortable  Respiratory system: Clear to auscultation. Respiratory effort normal. No wheezing or crackle Cardiovascular system: S1 & S2 heard, RRR.  No pedal edema. Gastrointestinal system: Abdomen is nondistended, soft and nontender. Normal bowel sounds heard. Central nervous system: Alert and oriented. No focal neurological deficits. Extremities: Chronic lower extremities venous stasis changes. Skin: No rashes, lesions or ulcers Psychiatry: Judgement and insight appear normal. Mood & affect appropriate.     Data Reviewed: I have personally reviewed following labs and imaging studies  CBC: Recent Labs  Lab 01/20/17 0214 01/21/17 0305 01/22/17 1324 01/24/17 1408 01/25/17 0303  WBC 9.5 12.7* 10.3 7.4 7.9  HGB 11.1* 11.4* 9.3* 9.4* 10.0*  HCT 33.3* 35.3* 28.1* 27.4* 29.5*  MCV 84.5 86.9 86.2 85.6 85.0  PLT 272 254 224 219 970   Basic Metabolic Panel: Recent Labs  Lab 01/20/17 0214 01/21/17 0305  01/22/17 1324 01/24/17 1408 01/25/17 0303  NA 137 136 135 135 133*  K 3.8 4.8 3.9 4.1 3.9  CL 103 100* 103 99* 97*  CO2 25 27 23 26 26   GLUCOSE 117* 131* 104* 129* 140*  BUN 83* 81* 71* 62* 69*  CREATININE 4.96* 5.12* 4.69* 4.79* 4.98*  CALCIUM 7.2* 7.3* 7.1* 7.1* 7.1*  PHOS  --   --  3.6  --   --    GFR: Estimated Creatinine Clearance: 19.5 mL/min (A) (by C-G formula based on SCr of  4.98 mg/dL (H)). Liver Function Tests: Recent Labs  Lab 01/20/17 0214 01/22/17 1324 01/24/17 1408  AST 12*  --  28  ALT 24  --  32  ALKPHOS 45  --  41  BILITOT 0.7  --  0.6  PROT 5.1*  --  4.9*  ALBUMIN 1.9* 1.7* 1.7*   No results for input(s): LIPASE, AMYLASE in the last 168 hours. No results for input(s): AMMONIA in the last 168 hours. Coagulation Profile: No results for input(s): INR, PROTIME in the last 168 hours. Cardiac Enzymes: No results for input(s): CKTOTAL, CKMB, CKMBINDEX, TROPONINI in the last 168 hours. BNP (last 3 results) No results for input(s): PROBNP in the last 8760 hours. HbA1C: No results for input(s): HGBA1C in the last 72 hours. CBG: Recent Labs  Lab 01/20/17 0914  GLUCAP 87   Lipid Profile: No results for input(s): CHOL, HDL, LDLCALC, TRIG, CHOLHDL, LDLDIRECT in the last 72 hours. Thyroid Function Tests: No results for input(s): TSH, T4TOTAL, FREET4, T3FREE, THYROIDAB in the last 72 hours. Anemia Panel: No results for input(s): VITAMINB12, FOLATE, FERRITIN, TIBC, IRON, RETICCTPCT in the last 72 hours. Sepsis Labs: No results for input(s): PROCALCITON, LATICACIDVEN in the last 168 hours.  Recent Results (from the past 240 hour(s))  Culture, blood (routine x 2)     Status: None   Collection Time: 01/16/17  5:11 PM  Result Value Ref Range Status   Specimen Description BLOOD RIGHT ANTECUBITAL  Final   Special Requests   Final    BOTTLES DRAWN AEROBIC AND ANAEROBIC Blood Culture adequate volume   Culture   Final    NO GROWTH 5 DAYS Performed at [facility], [address], [place], [place] [address]    Report Status 01/22/2017 FINAL  Final  Culture, blood (routine x 2)     Status: None   Collection Time: 01/16/17  7:44 PM  Result Value Ref Range Status   Specimen Description BLOOD RIGHT ANTECUBITAL  Final   Special Requests   Final    BOTTLES DRAWN AEROBIC AND ANAEROBIC Blood Culture adequate volume   Culture   Final    NO GROWTH 5  DAYS Performed at [facility], [place] [address], [place], [place] [address]    Report Status 01/22/2017 FINAL  Final  MRSA PCR Screening     Status: None   Collection Time: 01/17/17  9:31 PM  Result Value Ref Range Status   MRSA by PCR NEGATIVE NEGATIVE Final    Comment:        The GeneXpert MRSA Assay (FDA approved for NASAL specimens only), is one component of a comprehensive MRSA colonization surveillance program. It is not intended to diagnose MRSA infection nor to guide or monitor treatment for MRSA infections.          Radiology Studies: No results found.      Scheduled Meds: . amLODipine  10 mg Oral Daily  . calcium  acetate  667 mg Oral TID WC  . carvedilol  25 mg Oral BID WC  . cloNIDine  0.1 mg Oral BID  . darbepoetin (ARANESP) injection - DIALYSIS  60 mcg Intravenous Q Tue-HD  . feeding supplement (NEPRO CARB STEADY)  237 mL Oral BID BM  . heparin injection (subcutaneous)  5,000 Units Subcutaneous Q8H  . isosorbide mononitrate  60 mg Oral Daily   Continuous Infusions: . sodium chloride Stopped (01/20/17 1508)     LOS: 10 days    Dron Tanna Furry, MD [facility] Pager [phone]  If 7PM-7AM, please contact night-coverage [email or web address] Password [email or web address] 01/26/2017, 11:17 AM

## 2017-01-26 NOTE — Plan of Care (Signed)
Care plan education complete, adequate for discharge

## 2017-01-26 NOTE — Care Management Note (Signed)
Case Management Note Original note by:  Elenor Quinones, RN 01/25/17 12:10pm  Patient Details  Name: Jason [name] MRN: [id number] Date of Birth: [birth year]  Subjective/Objective:     Pt admitted with cellulitis, hypertensive emergency, worsening RF  and SOB.                Action/Plan:  PTA from home.  ESRD not yet on outpt HD - however it has been determined that pt will now require perm HD - pt will need to be clipped.  Pt will need appt at [facility] and possibly [place] at discharge.  CM will continue to follow for discharge needs   Expected Discharge Date:  01/26/17               Expected Discharge Plan:  Home/Self Care  In-House Referral:  Clinical Social Work  Discharge planning Services  [place], [place] [facility], [facility], Follow-up appt scheduled, Medication Assistance  Post Acute Care Choice:    Choice offered to:  Patient  DME Arranged:  [place] rolling DME Agency:     HH Arranged:  RN [place] Agency:  [place]  Status of Service:  Completed, signed off  If discussed at [place] of Stay Meetings, dates discussed:    Additional Comments: 01/26/17 J. Kelvis Berger, RN, BSN Pt medically stable for discharge today.  [place] services arranged with [facility] for [facility] for wound care.  Pt requests RW for assistance, as he feels weak often after hemodialysis.  Pt uninsured, but is eligible for medication assistance through [place] letter given with explanation of program benefits.  Provided override of copays, as pt states he cannot afford them.  RW to be delivered to pt's room prior to dc.    01/26/2017  [facility] received approval for [facility] for pt.    Pt in agreement with Pt has appt with [facility] 1/23 at 1pm - information shared with pt and placed on AVS.    CM discussed recommendation of HHRN for wound care with pt, pt in agreement.  Pt informed CM that he has already secured a room at the [facility] located at [place] - room 107.  CM requested HHRN order for pt and informed [facility] of pending charity need.  01/24/17 CSW reviewed case with AD and will document determinations in Epic  Attending informed that housing resources were offered to pt and pt refused per CSW,  Both CM and CSW attempted to discussed impending discharge with pt - pt acknowledged that he was pending discharge however he continued to communicate that he will come up with his own plan.  Per attending pt is not appropriate for discharge at this time due to blood pressure medications changes today - plan is for pt to have HD tomorrow at [facility].  Physician Advisor informed.     CM informed by bedside nurse that pt is now stating he is homeless - daughter concurs ( CM was told pt by pt that he was from home independent on initial assessement).  Per bedside nurse pt continues to be independently mobilizing here at [facility] - no deficits noted. Pt is now clipped to [facility].   CSW consulted.  CM text paged MD requested discharge consideration   01/21/17 Pt is now s/p fistula placement with daily HD assessments.  Pt will need to be CLIPPED.   01/20/17 Pt refusing SNF (CSW received consult) and per note  also refusing perm access for HD, however will allow daily HD here in house for now. CM requested Palliative Consult - attending has decided to persue psych consult first.   CM will continue to follow for discharge needs  Reinaldo Raddle, RN, BSN  Trauma/Neuro ICU Case Manager [phone]

## 2017-01-26 NOTE — Progress Notes (Signed)
[facility] Progress Note   Subjective:   No new complaints.  Ready to be discharged. Has all information needed for scheduled outpatient dialysis tomorrow.  Objective Vitals:   01/25/17 2301 01/26/17 0423 01/26/17 0944 01/26/17 1019  BP: (!) 142/91 (!) 130/96 127/90   Pulse:   72   Resp: 17 19 (!) 33 (!) 27  Temp: 98.7 F (37.1 C) 98.4 F (36.9 C) 98.5 F (36.9 C)   TempSrc: Oral Oral Oral   SpO2:   98%   Weight:      Height:       Physical Exam General: NAD, chronically ill appearing male, laying in bed Heart:RRR, no m/r/g Lungs:nml WOB, diminished BS, Mostly CTAB Abdomen:soft, NTND Extremities:4+ pitting edema B/l, +ULNA boots b/l LE Dialysis Access: TDC, LU AVF maturing +b/t   Filed Weights   01/22/17 1713 01/25/17 0645 01/25/17 1054  Weight: 97.5 kg (214 lb 15.2 oz) 101.8 kg (224 lb 6.9 oz) 97.5 kg (214 lb 15.2 oz)    Intake/Output Summary (Last 24 hours) at 01/26/2017 1616 Last data filed at 01/26/2017 0426 Gross per 24 hour  Intake 120 ml  Output 200 ml  Net -80 ml    Additional Objective Labs: Basic Metabolic Panel: Recent Labs  Lab 01/22/17 1324 01/24/17 1408 01/25/17 0303  NA 135 135 133*  K 3.9 4.1 3.9  CL 103 99* 97*  CO2 23 26 26   GLUCOSE 104* 129* 140*  BUN 71* 62* 69*  CREATININE 4.69* 4.79* 4.98*  CALCIUM 7.1* 7.1* 7.1*  PHOS 3.6  --   --    Liver Function Tests: Recent Labs  Lab 01/20/17 0214 01/22/17 1324 01/24/17 1408  AST 12*  --  28  ALT 24  --  32  ALKPHOS 45  --  41  BILITOT 0.7  --  0.6  PROT 5.1*  --  4.9*  ALBUMIN 1.9* 1.7* 1.7*   No results for input(s): LIPASE, AMYLASE in the last 168 hours. CBC: Recent Labs  Lab 01/20/17 0214 01/21/17 0305 01/22/17 1324 01/24/17 1408 01/25/17 0303  WBC 9.5 12.7* 10.3 7.4 7.9  HGB 11.1* 11.4* 9.3* 9.4* 10.0*  HCT 33.3* 35.3* 28.1* 27.4* 29.5*  MCV 84.5 86.9 86.2 85.6 85.0  PLT 272 254 224 219 223   CBG: Recent Labs  Lab 01/20/17 0914  GLUCAP 87     Medications: . sodium chloride Stopped (01/20/17 1508)   . amLODipine  10 mg Oral Daily  . calcium acetate  667 mg Oral TID WC  . carvedilol  25 mg Oral BID WC  . cloNIDine  0.1 mg Oral BID  . darbepoetin (ARANESP) injection - DIALYSIS  60 mcg Intravenous Q Tue-HD  . feeding supplement (NEPRO CARB STEADY)  237 mL Oral BID BM  . heparin injection (subcutaneous)  5,000 Units Subcutaneous Q8H  . isosorbide mononitrate  60 mg Oral Daily     Assessment/Plan: 1. Hypertensive Emergency - improved on carvedilol, amlodipine, Imdur 2. ESRD - CLIP's to [facility] for 2nd shift, 1st outpatient HD tomorrow. Orders written. 3. Anemia of CKD- Hgb stable, getting 93mcg aranesp qwk (Tues) 4. Secondary hyperparathyroidism - Cor Ca 7.9, P 3.6, continue binders. No VRDA started.   5. HTN/volume - BP controlled. Continues to have increased volume to be addressed at outpatient unit. 6. Nutrition - Renal diet with fluid restriction.  7. Dispo - to D/c today  Jen Mow, PA-C [facility] Pager: [phone] 01/26/2017,4:16 PM  LOS: 10 days

## 2017-02-01 ENCOUNTER — Encounter: Payer: Self-pay | Admitting: Adult Health

## 2017-02-02 ENCOUNTER — Ambulatory Visit: Payer: Self-pay | Admitting: Family Medicine

## 2017-02-07 ENCOUNTER — Ambulatory Visit (INDEPENDENT_AMBULATORY_CARE_PROVIDER_SITE_OTHER): Payer: Self-pay | Admitting: Family Medicine

## 2017-02-07 ENCOUNTER — Encounter: Payer: Self-pay | Admitting: Family Medicine

## 2017-02-07 ENCOUNTER — Ambulatory Visit: Payer: Self-pay | Admitting: Adult Health

## 2017-02-07 VITALS — BP 130/82 | HR 74 | Temp 97.4°F | Resp 14 | Ht 74.0 in | Wt 225.0 lb

## 2017-02-07 DIAGNOSIS — L0390 Cellulitis, unspecified: Secondary | ICD-10-CM

## 2017-02-07 DIAGNOSIS — R748 Abnormal levels of other serum enzymes: Secondary | ICD-10-CM

## 2017-02-07 DIAGNOSIS — L03119 Cellulitis of unspecified part of limb: Secondary | ICD-10-CM

## 2017-02-07 DIAGNOSIS — N186 End stage renal disease: Secondary | ICD-10-CM

## 2017-02-07 MED ORDER — CLONIDINE HCL 0.1 MG PO TABS: 0.1000 mg | ORAL_TABLET | Freq: Two times a day (BID) | ORAL | 1 refills | Status: DC

## 2017-02-07 MED ORDER — ISOSORBIDE MONONITRATE ER 60 MG PO TB24: 60.0000 mg | ORAL_TABLET | Freq: Every day | ORAL | 1 refills | Status: DC

## 2017-02-07 MED ORDER — AMLODIPINE BESYLATE 10 MG PO TABS: 10.0000 mg | ORAL_TABLET | Freq: Every day | ORAL | 1 refills | Status: DC

## 2017-02-07 MED ORDER — CALCIUM ACETATE (PHOS BINDER) 667 MG PO CAPS: 667.0000 mg | ORAL_CAPSULE | Freq: Three times a day (TID) | ORAL | 1 refills | Status: DC

## 2017-02-07 MED ORDER — CARVEDILOL 25 MG PO TABS: 25.0000 mg | ORAL_TABLET | Freq: Two times a day (BID) | ORAL | 1 refills | Status: DC

## 2017-02-07 MED ORDER — ALBUTEROL SULFATE HFA 108 (90 BASE) MCG/ACT IN AERS: 2.0000 | INHALATION_SPRAY | RESPIRATORY_TRACT | 1 refills | Status: DC | PRN

## 2017-02-07 NOTE — Progress Notes (Signed)
Patient ID: Jason [name], male    DOB: [birth year], 57 y.o.   MRN: [id number]  PCP: Scot Jun, FNP  Chief Complaint  Patient presents with  . Establish Care  . Hospitalization Follow-up    Subjective:  HPI Jason [name] is a 57 y.o. male, with ESRD, CHF, HTN, medical non-compliance, presents to establish care and hospital follow-up.   Hospital Follow-up On 01/16/2017, Jason [name] presented to [facility] with a complaint of worsening shortness of breath, bilateral lower leg edema, altered mental status, hypertension crisis, worsening renal function, heart failure, bilateral venous stasis cellulitis. He had been lost to follow-up with nephrology. In brief, he received emergency hemodialysis, resumed on Amlodipine, Coreg, Clonidine, and Imdur, newly diagnosed HF, echo w/ EF 35-40% with grade 2 diastolic dysfunction. Patient underwent a psychiatric evaluation as he declined R/L diagnostic heart catheterization and hydralazine. He was started on vancomycin IV for treatment of cellulitis. He was started on Aranesp for treatment anemia of chronic disease.    Establish Care  Today, he reports missing dialysis treatment two days last week as he is was out of town. Jason [name] reports being diagnosed with with kidney disease  3-4 years ago. He thinks CKD was related to taking  Lisinopril. He reports that he  "took a kidney tonic" and the kidney function improved, he stopped going to [facility]. Currently scheduled to receive dialysis Tuesday, Thursdays, and Fridays. He was previously established with [facility] although has been lost to follow-up. Jason [name] continues to experience pain which is most pronounced in the area where he has the wounds. Home health services has been ordered for patient, although he reports no one has been out to his home. he has limited supplies at home remained to change wound dressings. He denies chest pain, shortness of breath, cough, or new weakness. Social History    Socioeconomic History  . Marital status: Single    Spouse name: Not on file  . Number of children: Not on file  . Years of education: Not on file  . Highest education level: Not on file  Social Needs  . Financial resource strain: Not on file  . Food insecurity - worry: Not on file  . Food insecurity - inability: Not on file  . Transportation needs - medical: Not on file  . Transportation needs - non-medical: Not on file  Occupational History  . Not on file  Tobacco Use  . Smoking status: Former Research scientist (life sciences)  . Smokeless tobacco: Never Used  . Tobacco comment: quit smoking in 1997  Substance and Sexual Activity  . Alcohol use: No  . Drug use: No  . Sexual activity: Not on file  Other Topics Concern  . Not on file  Social History Narrative  . Not on file    Family History  Problem Relation Age of Onset  . Heart attack Mother 77       CABG hx  . Heart disease Mother   . Hypertension Mother   . Healthy Father   . Healthy Brother    Review of Systems  Constitutional: Positive for fatigue.  HENT: Negative.   Respiratory: Negative.   Cardiovascular: Positive for leg swelling.  Gastrointestinal: Positive for abdominal distention.  Genitourinary:       Low urine production   Musculoskeletal: Positive for arthralgias.  Skin: Positive for wound.  Neurological: Negative.   Psychiatric/Behavioral: Negative.     Patient Active Problem List   Diagnosis Date Noted  . ESRD (end stage renal  disease) ([place])   . NSVT (nonsustained ventricular tachycardia) ([place])   . Evaluation by psychiatric service required 01/19/2017  . Hypertensive emergency 01/16/2017  . Elevated troponin 01/16/2017  . Acute CHF (congestive heart failure) ([place]) 01/16/2017  . Cellulitis 01/16/2017  . Chronic kidney disease 02/27/2015  . Essential hypertension 02/26/2015  . Right inguinal hernia 02/26/2015  . AKI (acute kidney injury) ([place])   . Acute upper respiratory infection 01/30/2015  . Abnormal EKG  01/30/2015  . Acute kidney injury ([place]) 01/29/2015  . Anemia 01/29/2015    Allergies  Allergen Reactions  . Lisinopril Cough    Prior to Admission medications   Medication Sig Start Date End Date Taking? Authorizing Provider  amLODipine (NORVASC) 10 MG tablet Take 1 tablet (10 mg total) by mouth daily. 01/26/17  Yes [name], MD  calcium acetate (PHOSLO) 667 MG capsule Take 1 capsule (667 mg total) by mouth 3 (three) times daily with meals. 01/26/17  Yes [name], MD  carvedilol (COREG) 25 MG tablet Take 1 tablet (25 mg total) by mouth 2 (two) times daily with a meal. 01/26/17  Yes [name], MD  cloNIDine (CATAPRES) 0.1 MG tablet Take 1 tablet (0.1 mg total) by mouth 2 (two) times daily. 01/26/17  Yes [name], MD  isosorbide mononitrate (IMDUR) 60 MG 24 hr tablet Take 1 tablet (60 mg total) by mouth daily. 01/26/17  Yes [name], MD  Multiple Vitamin (MULTIVITAMIN WITH MINERALS) TABS tablet Take 1 tablet by mouth daily.   Yes [provider]  Nutritional Supplements (FEEDING SUPPLEMENT, NEPRO CARB STEADY,) LIQD Take 237 mLs by mouth 3 (three) times daily with meals. Patient not taking: Reported on 02/07/2017 01/26/17   [name], MD    Past Medical, Surgical Family and Social History reviewed and updated.    Objective:   Today's Vitals   02/07/17 0925 02/07/17 1000  BP: 140/90 130/82  Pulse: 74   Resp: 14   Temp: (!) 97.4 F (36.3 C)   TempSrc: Oral   SpO2: 96%   Weight: 225 lb (102.1 kg)   Height: 6\' 2"  (1.88 m)     Wt Readings from Last 3 Encounters:  02/07/17 225 lb (102.1 kg)  01/25/17 214 lb 15.2 oz (97.5 kg)  04/04/16 205 lb (93 kg)    Physical Exam  Constitutional: He is oriented to person, place, and time. He appears well-developed and well-nourished.  HENT:  Head: Normocephalic and atraumatic.  Right Ear: External ear normal.  Left Ear: External ear normal.  Eyes: Conjunctivae  are normal. Pupils are equal, round, and reactive to light.  Neck: Normal range of motion. Neck supple.  Cardiovascular: Normal rate, regular rhythm, normal heart sounds and intact distal pulses.  Pulmonary/Chest: Effort normal and breath sounds normal.  Abdominal: He exhibits distension.  Musculoskeletal: Normal range of motion.  Neurological: He is alert and oriented to person, place, and time.  Skin: There is erythema.  Bilateral leg wounds visualized.  Persistent weeping noted bilateral lower extremities. Erythema noted bilaterally on the anterior and laterally on both lower bilateral extremities.  Psychiatric: His affect is inappropriate. His speech is delayed. He is slowed. He expresses inappropriate judgment.   Assessment & Plan:  1. ESRD (end stage renal disease) ([place]), patient continues to remain noncompliant with dialysis despite recent hospitalization.  Admitted today that he is missed to hemodialysis sessions.  He continues to have persistent lower extremity edema today. He has sustained a weight gain  of 11 pounds in less than 10 days. He is scheduled for dialysis on tomorrow and reports that he will make his appointment.  I am having him reestablish with nephrology at [facility].  Will check a CMP.  2. Cellulitis, unspecified cellulitis site, persistent. Will repeat CBC today. If WBC elevated will extend antibiotic coverage.  3. Elevated liver enzymes, likely secondary to severe renal disease. Repeating CMP today.   Meds ordered this encounter  Medications  . isosorbide mononitrate (IMDUR) 60 MG 24 hr tablet    Sig: Take 1 tablet (60 mg total) by mouth daily.    Dispense:  90 tablet    Refill:  1    Order Specific Question:   Supervising Provider    Answer:   Tresa Garter [id number]  . cloNIDine (CATAPRES) 0.1 MG tablet    Sig: Take 1 tablet (0.1 mg total) by mouth 2 (two) times daily.    Dispense:  180 tablet    Refill:  1    Order Specific Question:    Supervising Provider    Answer:   Tresa Garter [id number]  . carvedilol (COREG) 25 MG tablet    Sig: Take 1 tablet (25 mg total) by mouth 2 (two) times daily with a meal.    Dispense:  90 tablet    Refill:  1    Order Specific Question:   Supervising Provider    Answer:   Tresa Garter [id number]  . amLODipine (NORVASC) 10 MG tablet    Sig: Take 1 tablet (10 mg total) by mouth daily.    Dispense:  90 tablet    Refill:  1    Order Specific Question:   Supervising Provider    Answer:   Tresa Garter [id number]  . calcium acetate (PHOSLO) 667 MG capsule    Sig: Take 1 capsule (667 mg total) by mouth 3 (three) times daily with meals.    Dispense:  90 capsule    Refill:  1    Order Specific Question:   Supervising Provider    Answer:   Tresa Garter [id number]  . albuterol (PROVENTIL HFA;VENTOLIN HFA) 108 (90 Base) MCG/ACT inhaler    Sig: Inhale 2 puffs into the lungs every 4 (four) hours as needed for wheezing or shortness of breath (cough, shortness of breath or wheezing.).    Dispense:  1 Inhaler    Refill:  1    Order Specific Question:   Supervising Provider    Answer:   Tresa Garter [id number]   Orders Placed This Encounter  Procedures  . CBC with Differential  . Comprehensive metabolic panel  . Hepatitis c vrs RNA detect by PCR-qual    Standing Status:   Future    Standing Expiration Date:   02/10/2018  . Hepatic Function Panel    Standing Status:   Future    Standing Expiration Date:   02/10/2018  . Ambulatory referral to Nephrology    Referral Priority:   Routine    Referral Type:   Consultation    Referral Reason:   Specialty Services Required    Requested Specialty:   Nephrology    Number of Visits Requested:   1   Patient advised of the importance of returning for follow-up visit for wound evaluation and to evaluate weight gain.  Advised to return in 4 days  Carroll Sage. [name], MSN, FNP-C [facility]  [address] [place], [place] [address]  [phone]    

## 2017-02-07 NOTE — Progress Notes (Signed)
Bilateral lower extemities cleansed with sterile NS.  Vaseline gauze applied to open wounds and each extremity covered with 4x4s and kerlix gauze.  Secured with medipore tape.  Patient tolerated procedure well.

## 2017-02-08 LAB — COMPREHENSIVE METABOLIC PANEL
ALBUMIN: 2.3 g/dL — AB (ref 3.5–5.5)
ALT: 113 IU/L — ABNORMAL HIGH (ref 0–44)
AST: 97 IU/L — ABNORMAL HIGH (ref 0–40)
Albumin/Globulin Ratio: 0.7 — ABNORMAL LOW (ref 1.2–2.2)
Alkaline Phosphatase: 86 IU/L (ref 39–117)
BUN / CREAT RATIO: 9 (ref 9–20)
BUN: 46 mg/dL — AB (ref 6–24)
Bilirubin Total: 0.3 mg/dL (ref 0.0–1.2)
CO2: 23 mmol/L (ref 20–29)
CREATININE: 4.97 mg/dL — AB (ref 0.76–1.27)
Calcium: 7.8 mg/dL — ABNORMAL LOW (ref 8.7–10.2)
Chloride: 99 mmol/L (ref 96–106)
GFR calc non Af Amer: 12 mL/min/{1.73_m2} — ABNORMAL LOW (ref >59)
GFR, EST AFRICAN AMERICAN: 14 mL/min/{1.73_m2} — AB (ref >59)
GLOBULIN, TOTAL: 3.4 g/dL (ref 1.5–4.5)
GLUCOSE: 89 mg/dL (ref 65–99)
Potassium: 4.2 mmol/L (ref 3.5–5.2)
SODIUM: 137 mmol/L (ref 134–144)
TOTAL PROTEIN: 5.7 g/dL — AB (ref 6.0–8.5)

## 2017-02-08 LAB — CBC WITH DIFFERENTIAL/PLATELET
BASOS ABS: 0 10*3/uL (ref 0.0–0.2)
Basos: 1 %
EOS (ABSOLUTE): 0.2 10*3/uL (ref 0.0–0.4)
EOS: 3 %
HEMATOCRIT: 27.5 % — AB (ref 37.5–51.0)
HEMOGLOBIN: 9 g/dL — AB (ref 13.0–17.7)
IMMATURE GRANS (ABS): 0 10*3/uL (ref 0.0–0.1)
Immature Granulocytes: 1 %
LYMPHS: 19 %
Lymphocytes Absolute: 1 10*3/uL (ref 0.7–3.1)
MCH: 28.6 pg (ref 26.6–33.0)
MCHC: 32.7 g/dL (ref 31.5–35.7)
MCV: 87 fL (ref 79–97)
MONOCYTES: 12 %
Monocytes Absolute: 0.6 10*3/uL (ref 0.1–0.9)
NEUTROS ABS: 3.4 10*3/uL (ref 1.4–7.0)
Neutrophils: 64 %
Platelets: 297 10*3/uL (ref 150–379)
RBC: 3.15 x10E6/uL — AB (ref 4.14–5.80)
RDW: 15 % (ref 12.3–15.4)
WBC: 5.3 10*3/uL (ref 3.4–10.8)

## 2017-02-10 ENCOUNTER — Telehealth: Payer: Self-pay | Admitting: Family Medicine

## 2017-02-10 NOTE — Telephone Encounter (Signed)
Patient notified

## 2017-02-10 NOTE — Telephone Encounter (Signed)
Contact patient to advise recent labs indicated abnormal liver enzymes.  This could be secondary to recently diagnosed heart failure and or kidney disease.  I will repeat labs at his next scheduled follow-up which is on tomorrow.  Jason [name]. [name], MSN, FNP-C [facility]  [address] [place], [place] [address] [phone]

## 2017-02-11 ENCOUNTER — Ambulatory Visit: Payer: Self-pay | Admitting: Family Medicine

## 2017-02-11 DIAGNOSIS — Z992 Dependence on renal dialysis: Secondary | ICD-10-CM | POA: Diagnosis not present

## 2017-02-11 DIAGNOSIS — I129 Hypertensive chronic kidney disease with stage 1 through stage 4 chronic kidney disease, or unspecified chronic kidney disease: Secondary | ICD-10-CM | POA: Diagnosis not present

## 2017-02-11 DIAGNOSIS — N186 End stage renal disease: Secondary | ICD-10-CM | POA: Diagnosis not present

## 2017-02-15 ENCOUNTER — Telehealth: Payer: Self-pay

## 2017-02-15 ENCOUNTER — Ambulatory Visit (INDEPENDENT_AMBULATORY_CARE_PROVIDER_SITE_OTHER): Payer: Self-pay | Admitting: Family Medicine

## 2017-02-15 ENCOUNTER — Encounter: Payer: Self-pay | Admitting: Family Medicine

## 2017-02-15 VITALS — BP 162/84 | HR 100 | Temp 97.8°F | Resp 16 | Ht 74.0 in | Wt 233.0 lb

## 2017-02-15 DIAGNOSIS — R748 Abnormal levels of other serum enzymes: Secondary | ICD-10-CM

## 2017-02-15 DIAGNOSIS — L03116 Cellulitis of left lower limb: Secondary | ICD-10-CM

## 2017-02-15 MED ORDER — CARVEDILOL 25 MG PO TABS: 25.0000 mg | ORAL_TABLET | Freq: Two times a day (BID) | ORAL | 1 refills | Status: DC

## 2017-02-15 NOTE — Progress Notes (Signed)
Patient ID: Jason [name], male    DOB: [birth year], 57 y.o.   MRN: [id number]  PCP: Scot Jun, FNP  Chief Complaint  Patient presents with  . Follow-up  . Cellulitis    Subjective:  HPI Jason [name] is a 57 y.o. male with ESRD, Accelerated HTN, CHF, medication non-compliance presents for evaluation of  cellulitis infection of bilateral legs. He continues to remain non-compliant with HD treatments indicating that he missed a total of 2/3 HD treatments schedule for the prior week. Complains that his left leg continues to drain and has an odor. No recent wound culture collected since [facility].  He reports home health services is coming out to his residence to perform wound care and provide supplies for wound changes.Denies fever, chills, or chest pain. Continue to experience SOB, reports that SOB hasn't worsened it at baseline. Social History   Socioeconomic History  . Marital status: Single    Spouse name: Not on file  . Number of children: Not on file  . Years of education: Not on file  . Highest education level: Not on file  Social Needs  . Financial resource strain: Not on file  . Food insecurity - worry: Not on file  . Food insecurity - inability: Not on file  . Transportation needs - medical: Not on file  . Transportation needs - non-medical: Not on file  Occupational History  . Not on file  Tobacco Use  . Smoking status: Former Research scientist (life sciences)  . Smokeless tobacco: Never Used  . Tobacco comment: quit smoking in 1997  Substance and Sexual Activity  . Alcohol use: No  . Drug use: No  . Sexual activity: Not on file  Other Topics Concern  . Not on file  Social History Narrative  . Not on file    Family History  Problem Relation Age of Onset  . Heart attack Mother 43       CABG hx  . Heart disease Mother   . Hypertension Mother   . Healthy Father   . Healthy Brother    Review of Systems  Constitutional: Positive for fatigue.  Respiratory:  Positive for shortness of breath.   Genitourinary: Negative.   Neurological: Negative.   Psychiatric/Behavioral: Negative.     Patient Active Problem List   Diagnosis Date Noted  . ESRD (end stage renal disease) ([place])   . NSVT (nonsustained ventricular tachycardia) ([place])   . Evaluation by psychiatric service required 01/19/2017  . Hypertensive emergency 01/16/2017  . Elevated troponin 01/16/2017  . Acute CHF (congestive heart failure) ([place]) 01/16/2017  . Cellulitis 01/16/2017  . Chronic kidney disease 02/27/2015  . Essential hypertension 02/26/2015  . Right inguinal hernia 02/26/2015  . AKI (acute kidney injury) ([place])   . Acute upper respiratory infection 01/30/2015  . Abnormal EKG 01/30/2015  . Acute kidney injury ([place]) 01/29/2015  . Anemia 01/29/2015    Allergies  Allergen Reactions  . Lisinopril Cough    Prior to Admission medications   Medication Sig Start Date End Date Taking? Authorizing Provider  albuterol (PROVENTIL HFA;VENTOLIN HFA) 108 (90 Base) MCG/ACT inhaler Inhale 2 puffs into the lungs every 4 (four) hours as needed for wheezing or shortness of breath (cough, shortness of breath or wheezing.). 02/07/17  Yes Scot Jun, FNP  amLODipine (NORVASC) 10 MG tablet Take 1 tablet (10 mg total) by mouth daily. 02/07/17  Yes Scot Jun, FNP  calcium acetate (PHOSLO) 667 MG capsule Take 1 capsule (667 mg  total) by mouth 3 (three) times daily with meals. 02/07/17  Yes Scot Jun, FNP  carvedilol (COREG) 25 MG tablet Take 1 tablet (25 mg total) by mouth 2 (two) times daily with a meal. 02/07/17  Yes Scot Jun, FNP  cloNIDine (CATAPRES) 0.1 MG tablet Take 1 tablet (0.1 mg total) by mouth 2 (two) times daily. 02/07/17  Yes Scot Jun, FNP  isosorbide mononitrate (IMDUR) 60 MG 24 hr tablet Take 1 tablet (60 mg total) by mouth daily. 02/07/17  Yes Scot Jun, FNP  Multiple Vitamin (MULTIVITAMIN WITH MINERALS) TABS tablet Take 1 tablet by  mouth daily.   Yes [provider]  Nutritional Supplements (FEEDING SUPPLEMENT, NEPRO CARB STEADY,) LIQD Take 237 mLs by mouth 3 (three) times daily with meals. 01/26/17  Yes [name], MD    Past Medical, Surgical Family and Social History reviewed and updated.    Objective:   Today's Vitals   02/15/17 1005  BP: (!) 180/100  Pulse: 100  Resp: 16  Temp: 97.8 F (36.6 C)  TempSrc: Oral  SpO2: 96%  Weight: 233 lb (105.7 kg)  Height: 6\' 2"  (1.88 m)    Wt Readings from Last 3 Encounters:  02/15/17 233 lb (105.7 kg)  02/07/17 225 lb (102.1 kg)  01/25/17 214 lb 15.2 oz (97.5 kg)    Physical Exam  Cardiovascular: Normal rate, regular rhythm, normal heart sounds and intact distal pulses.  Pulmonary/Chest: Effort normal and breath sounds normal.  Psychiatric: His affect is blunt. Cognition and memory are impaired. He expresses inappropriate judgment.  Wound care visit only  Wounds undressed. Bilateral lower extremities gross edema with presence of persistent "weeping" serosanguinous  -greater drainage from left lower extremities compared to right. Odor present. Wound culture obtain.     Assessment & Plan:  1. Cellulitis of left lower extremity, - WOUND CULTURE obtained. He will continue wound care through [facility]. Continue daily dressing changes. Will consider referral to ID vs initiation of antibiotic depending on growth of culture.  2. Elevated liver enzymes, will repeat hepatic function today and add Hep C with reflex.   Accelerated BP not addressed as patient is in route to hemodialysis after visit today.   Orders Placed This Encounter  Procedures  . WOUND CULTURE    Order Specific Question:   Source    Answer:   left lower leg    RTC:  3 months for chronic condition management.   Carroll Sage. [name], MSN, FNP-C [facility]  [address] [place], [place] [address] [phone]

## 2017-02-15 NOTE — Telephone Encounter (Signed)
Bilateral lower extemities cleansed with sterile NS.  Vaseline gauze applied to open wounds and each extremity covered with 4x4s and kerlix gauze.  Secured with medipore tape.  Patient tolerated procedure well.

## 2017-02-17 ENCOUNTER — Telehealth: Payer: Self-pay | Admitting: Family Medicine

## 2017-02-17 DIAGNOSIS — A498 Other bacterial infections of unspecified site: Secondary | ICD-10-CM

## 2017-02-17 DIAGNOSIS — T148XXA Other injury of unspecified body region, initial encounter: Principal | ICD-10-CM

## 2017-02-17 DIAGNOSIS — L089 Local infection of the skin and subcutaneous tissue, unspecified: Secondary | ICD-10-CM

## 2017-02-17 NOTE — Telephone Encounter (Signed)
I am placing a referral for infectious disease.  Patient's recent wound culture of bilateral leg wounds grew out Pseudomonas aeruginosa.  Patient has end-stage renal disease and receiving hemodialysis 3 times weekly.  His wounds appear to be delayed in healing.  I am referring patient to infectious disease for further evaluation and management of infection.   Morey Hummingbird please call regional infectious disease to obtain the next available appointment for patient.  Contact patient to advise of appointment and the medical necessity of him attending this appointment.Marland Kitchen

## 2017-02-17 NOTE — Telephone Encounter (Signed)
Patient notified and appointment scheduled for tomorrow at 8:30am. Patient is aware of appointment and states he will be there.

## 2017-02-18 ENCOUNTER — Encounter: Payer: Self-pay | Admitting: Infectious Diseases

## 2017-02-18 ENCOUNTER — Telehealth: Payer: Self-pay | Admitting: Infectious Diseases

## 2017-02-18 ENCOUNTER — Ambulatory Visit (INDEPENDENT_AMBULATORY_CARE_PROVIDER_SITE_OTHER): Payer: Self-pay | Admitting: Infectious Diseases

## 2017-02-18 VITALS — BP 171/101 | HR 132 | Temp 98.1°F | Ht 74.0 in | Wt 220.0 lb

## 2017-02-18 DIAGNOSIS — R601 Generalized edema: Secondary | ICD-10-CM | POA: Insufficient documentation

## 2017-02-18 DIAGNOSIS — L97822 Non-pressure chronic ulcer of other part of left lower leg with fat layer exposed: Secondary | ICD-10-CM

## 2017-02-18 DIAGNOSIS — I872 Venous insufficiency (chronic) (peripheral): Secondary | ICD-10-CM

## 2017-02-18 DIAGNOSIS — I10 Essential (primary) hypertension: Secondary | ICD-10-CM

## 2017-02-18 DIAGNOSIS — N186 End stage renal disease: Secondary | ICD-10-CM

## 2017-02-18 LAB — WOUND CULTURE

## 2017-02-18 LAB — HEPATIC FUNCTION PANEL
ALT: 31 IU/L (ref 0–44)
AST: 16 IU/L (ref 0–40)
Albumin: 2.8 g/dL — ABNORMAL LOW (ref 3.5–5.5)
Alkaline Phosphatase: 60 IU/L (ref 39–117)
Bilirubin Total: 0.4 mg/dL (ref 0.0–1.2)
Bilirubin, Direct: 0.13 mg/dL (ref 0.00–0.40)
TOTAL PROTEIN: 5.9 g/dL — AB (ref 6.0–8.5)

## 2017-02-18 LAB — HEPATITIS C VRS RNA DETECT BY PCR-QUAL: HCV RNA NAA QUALITATIVE: NEGATIVE

## 2017-02-18 NOTE — Telephone Encounter (Signed)
Please call patient to inform of appointment with the [facility] at [facility] Location set for February 22nd 2019 @ 9:15 am. They will call him if an earlier appointment comes up. Thank you.

## 2017-02-18 NOTE — Assessment & Plan Note (Addendum)
I called [facility] team to discuss this patient. Dry weight reportedly around 97 kg however he is up to 106 kg per last office visit with PCP. Our scale has him 10 lbs down however I doubt this is accurate; he easily appears to be up 20-30 lbs with fluid. He has pitting edema up to his thighs/hips and abdominal bloating. He has serous/clear fluid weeping out of his legs and evidence of bullae that have ruptured recently with full thickness skin loss. He is new to dialysis and had an AVF created 01/20/17. Serum albumin < 2 which is also not helping.   I believe he needs extra dialysis treatments to get some of this fluid off to not only allow these wounds to heal but to allow him to move and breathe better. I advised Mr. Kerby that he should never miss any of his HD sessions as this wound will never heal putting him at high risk for bacterial infection in his current state, bacteremias and infection of dialysis catheters/grafts.

## 2017-02-18 NOTE — Patient Instructions (Addendum)
Will try to get you into the wound center for treatment.   Controlling your swelling is going to be a major part of how we can get this to heal for you.  For now I would like to hold off on antibiotics in hopes we can get a better sample of what is.   Will try to get some Santyl for your wound to see if this can help [place] out.   Please come back in 3 weeks to see me again.

## 2017-02-18 NOTE — Assessment & Plan Note (Signed)
Uncontrolled and with tachycardia today - His heart rate on reassessment during my exam was down to about 110 upon reassessment. He informed our team he is out of his blood pressure medications - likely he has no refills after recent hospitalization. I will contact his PCP to discuss with her but also advised Jason [name] to call on his own behalf to the pharmacy and PCP team.

## 2017-02-18 NOTE — Assessment & Plan Note (Addendum)
Mr. Coury has chronic venous insufficiency with chronic ulcerations present. At this point I do not feel as if antibiotics are warranted and meticulous wound care is what he needs in addition to better fluid management with HD. There are many areas of full thickness skin loss d/t ruptured bullae/vesicles related to his anasarca. On exam his leg is cool to the touch and non-painful. The only drainage I see is serous and I observe no odor outside his xeroform gauze. He has no fevers and normal WBC.The yellow material in the wound bed is slough/fibrin that is not easily removed and needs to be debrided. Superficial wound swab noted with Pseudomonas (pansensitive) and MRSA, however these swabs are not helpful with chronic wounds to guide therapy as they are almost always colonized with bacteria that is not necessarily causing infection. Culturing tissue sample after debridement has occurred to see if there is a true offending pathogen contributing to poor healing would be the recommended approach.   Considering his arterial blood flow is adequate I have advised him to wrap his legs with every dressing change to his knee with ACE wraps to offer at least some compression which will be key to his wound healing. I would like to prescribe Santyl for him however without insurance this is not an option for him considering as it is $50 a tube even with patient assistance.   I have called the wound center to make an appointment for him and first available in [place] location is Feb 22. I have made him an appointment at 9:15. I will hold off on antibiotics at this point - should he develop pain, warmth of this site or fevers I would start him on ciprofloxacin, doxycycline and metronidazole; alternatively if his adherence with HD improved we could do vancomycin / ceftaz during HD sessions with PO metronidazole. I will have him return in 1 month to follow with coordination of care and clinical signs of infection. I have given him  precautions to present to ED; he is currently not insured but has medicaid pending.

## 2017-02-18 NOTE — Progress Notes (Signed)
Patient Name: Jason [name]  MRN: [id number]  DOB: [birth year]    PCP: Scot Jun, FNP  Referring Provider: Scot Jun, FNP   Chief Complaint  Patient presents with  . Wound Infection  . Leg Swelling    Patient Active Problem List   Diagnosis Date Noted  . Anasarca 02/18/2017  . ESRD (end stage renal disease) ([place])   . NSVT (nonsustained ventricular tachycardia) ([place])   . Evaluation by psychiatric service required 01/19/2017  . Elevated troponin 01/16/2017  . Acute CHF (congestive heart failure) ([place]) 01/16/2017  . Venous stasis ulcer ([employer]) 01/16/2017  . Chronic kidney disease 02/27/2015  . Essential hypertension 02/26/2015  . Right inguinal hernia 02/26/2015  . Abnormal EKG 01/30/2015  . Anemia 01/29/2015   SUBJECTIVE:   HPI/ROS:  Jason [name] is a 57 y.o. AA non-diabetic male with ESRD newly placed on dialysis referred for evaluation and treatment of left lower leg chronic non-healing wound.   He was hospitalized from 1/06 - 01/26/2017 for chest pain, headaches, vision changes and abdominal pain. Found to have significant SOB and bilateral lower extremity swelling, hypertensive emergency a/c CHF exacerbation and acute encephalopathy. CT of LLE with severe soft tissue edema (cellulitis vs venous stasis) without evidence of osteomyelitis involvement. Drainage was described as purulent at the time of the exam a month prior. Resting Left ABI on 01/17/17 was within normal range. Echo 01/17/17 with reduced EF 35-40%. AVF was placed during this hospitalization and it was recommended to start on dialysis. He also was recommended cardiac catheterization considering his EF has worsened however he declined. He was not given any antibiotics during inpatient admission for this wound and has not had any outpatient.   He has seen his PCP a few times recently for follow up on his chronic medical conditions. Wounds on his left lower extremity have been present at least a month but does  not recall when they originally erupted. During hospitalization he experienced a great deal of pain around these tissue but reports no pain now around the wounds on his legs. He has had several fluid blisters erupt to which he pops them and rips the skin off. A superficial wound culture from one of these sites was obtained 3 days ago in PCP office showing Pseudomonas (pansensitive) and MRSA (S-tetracyclines, linezolid, bactrim). He reports no fevers,   He has gained at least 25 lbs since the beginning of the year. Lost a few pounds since his last HD session yesterday and will be going back tomorrow. His schedule is M-W-F and reports he has been compliant with these appointments.   Review of Systems  Constitutional: Positive for malaise/fatigue. Negative for chills and fever.  HENT: Negative for tinnitus.   Eyes: Negative for blurred vision and photophobia.  Respiratory: Positive for shortness of breath. Negative for cough and sputum production.   Cardiovascular: Positive for leg swelling. Negative for chest pain.  Gastrointestinal: Negative for diarrhea, nausea and vomiting.  Genitourinary: Negative for dysuria.  Skin: Positive for rash.  Neurological: Negative for headaches.     Past Medical History:  Diagnosis Date  . AKI (acute kidney injury) ([place]) 01/2015  . CHF (congestive heart failure) ([place])   . Hypertension     Social History   Tobacco Use  . Smoking status: Former Research scientist (life sciences)  . Smokeless tobacco: Never Used  . Tobacco comment: quit smoking in 1997  Substance Use Topics  . Alcohol use: No  . Drug use: No    Family  History  Problem Relation Age of Onset  . Heart attack Mother 97       CABG hx  . Heart disease Mother   . Hypertension Mother   . Healthy Father   . Healthy Brother      Allergies  Allergen Reactions  . Lisinopril Cough     OBJECTIVE: Vitals:   02/18/17 0839  BP: (!) 171/101  Pulse: (!) 132  Temp: 98.1 F (36.7 C)  TempSrc: Oral  Weight: 220  lb (99.8 kg)  Height: 6\' 2"  (1.88 m)   Body mass index is 28.25 kg/m.  Physical Exam  Constitutional: He is oriented to person, place, and time and well-developed, well-nourished, and in no distress.  Chronically ill appearing AA male.   HENT:  Mouth/Throat: Oropharynx is clear and moist. No oral lesions. Normal dentition. No dental caries.  Eyes: Pupils are equal, round, and reactive to light. No scleral icterus.  Cardiovascular: Regular rhythm, S1 normal, S2 normal and intact distal pulses. Tachycardia present.  Murmur heard.  Systolic murmur is present. Pulmonary/Chest: Effort normal and breath sounds normal.  Abdominal: Soft. He exhibits distension and ascites. There is no tenderness.  Musculoskeletal: He exhibits edema (4+ edema from ankle to hips/thighs bilateral lower extremeties. ).  Lymphadenopathy:    He has no cervical adenopathy.  Neurological: He is alert and oriented to person, place, and time.  Skin: Skin is warm and dry. No rash noted.  Chronic venous stasis dermatitis noted to bilateral LE to knees.   Psychiatric: Mood and affect normal.  Vitals reviewed. Lower Extremity Leg Wounds to Left leg = Anterior shin and posterior calf with several areas of annular full-thickness skin loss. Serous drainage without odor. Red wound bed. Distal anterior and lateral shin/ankle wound noted with 100% yellow slough to wound bed. This is not easily removed from wound bed. Large coalesced ulceration with irregular boarders. No significant odor observed.            ASSESSMENT & PLAN:  Problem List Items Addressed This Visit      Cardiovascular and Mediastinum   Essential hypertension    Uncontrolled and with tachycardia today - His heart rate on reassessment during my exam was down to about 110 upon reassessment. He informed our team he is out of his blood pressure medications - likely he has no refills after recent hospitalization. I will contact his PCP to discuss with her  but also advised Jaycen to call on his own behalf to the pharmacy and PCP team.         Musculoskeletal and Integument   Venous stasis ulcer ([place]) - Primary    Mr. Kamaka has chronic venous insufficiency with chronic ulcerations present. At this point I do not feel as if antibiotics are warranted and meticulous wound care is what he needs in addition to better fluid management with HD. There are many areas of full thickness skin loss d/t ruptured bullae/vesicles related to his anasarca. On exam his leg is cool to the touch and non-painful. The only drainage I see is serous and I observe no odor outside his xeroform gauze. He has no fevers and normal WBC.The yellow material in the wound bed is slough/fibrin that is not easily removed and needs to be debrided. Superficial wound swab noted with Pseudomonas (pansensitive) and MRSA, however these swabs are not helpful with chronic wounds to guide therapy as they are almost always colonized with bacteria that is not necessarily causing infection. Culturing tissue sample after debridement  has occurred to see if there is a true offending pathogen contributing to poor healing would be the recommended approach.   Considering his arterial blood flow is adequate I have advised him to wrap his legs with every dressing change to his knee with ACE wraps to offer at least some compression which will be key to his wound healing. I would like to prescribe Santyl for him however without insurance this is not an option for him considering as it is $50 a tube even with patient assistance.   I have called the wound center to make an appointment for him and first available in [place] location is Feb 22. I have made him an appointment at 9:15. I will hold off on antibiotics at this point - should he develop pain, warmth of this site or fevers I would start him on ciprofloxacin, doxycycline and metronidazole; alternatively if his adherence with HD improved we could do vancomycin /  ceftaz during HD sessions with PO metronidazole. I will have him return in 1 month to follow with coordination of care and clinical signs of infection. I have given him precautions to present to ED; he is currently not insured but has medicaid pending.         Genitourinary   ESRD (end stage renal disease) ([place])     Other   Anasarca    I called [facility] team to discuss this patient. Dry weight reportedly around 97 kg however he is up to 106 kg per last office visit with PCP. Our scale has him 10 lbs down however I doubt this is accurate; he easily appears to be up 20-30 lbs with fluid. He has pitting edema up to his thighs/hips and abdominal bloating. He has serous/clear fluid weeping out of his legs and evidence of bullae that have ruptured recently with full thickness skin loss. He is new to dialysis and had an AVF created 01/20/17. Serum albumin < 2 which is also not helping.   I believe he needs extra dialysis treatments to get some of this fluid off to not only allow these wounds to heal but to allow him to move and breathe better. I advised Mr. Zelek that he should never miss any of his HD sessions as this wound will never heal putting him at high risk for bacterial infection in his current state, bacteremias and infection of dialysis catheters/grafts.              Janene Madeira, MSN, [facility]  02/18/2017  4:23 PM

## 2017-02-21 ENCOUNTER — Ambulatory Visit: Payer: Self-pay | Admitting: Adult Health

## 2017-02-21 ENCOUNTER — Ambulatory Visit: Payer: Self-pay | Admitting: Infectious Diseases

## 2017-02-23 ENCOUNTER — Ambulatory Visit: Payer: Self-pay | Admitting: Adult Health

## 2017-02-24 ENCOUNTER — Ambulatory Visit: Payer: Self-pay | Admitting: Physician Assistant

## 2017-02-25 ENCOUNTER — Telehealth: Payer: Self-pay

## 2017-02-25 MED FILL — ISOSORBIDE MN ER 60 MG TAB: 60 | 30 days supply | Qty: 30 | Fill #0

## 2017-02-25 MED FILL — CALCIUM ACETATE 667 MG CAP: 667 | 30 days supply | Qty: 90 | Fill #0

## 2017-02-25 MED FILL — ?CLONIDINE HCL 0.1 MG TABL: 0.1 | 30 days supply | Qty: 60 | Fill #0

## 2017-02-25 MED FILL — ?CARVEDILOL 25 MG TABLET: 25 | 30 days supply | Qty: 60 | Fill #0

## 2017-02-25 MED FILL — AMLODIPINE BESYLATE 10 MG T: 10 | 30 days supply | Qty: 30 | Fill #0

## 2017-02-25 NOTE — Telephone Encounter (Signed)
Called to inform Jason [name] that he has an appointment with [facility] wound [facility] center on 03/04/17 at 915am per the request of [facility],NP. Patient confirmed the understanding of this information. Pola Corn, LPN

## 2017-03-02 ENCOUNTER — Encounter: Payer: Self-pay | Admitting: Vascular Surgery

## 2017-03-02 ENCOUNTER — Ambulatory Visit (INDEPENDENT_AMBULATORY_CARE_PROVIDER_SITE_OTHER): Payer: Self-pay | Admitting: Vascular Surgery

## 2017-03-02 ENCOUNTER — Ambulatory Visit (HOSPITAL_COMMUNITY)
Admission: RE | Admit: 2017-03-02 | Discharge: 2017-03-02 | Disposition: A | Discharge status: Home / Self Care | Payer: Medicaid Other | Source: Ambulatory Visit | Attending: Vascular Surgery | Admitting: Vascular Surgery

## 2017-03-02 VITALS — BP 183/95 | HR 66 | Temp 98.5°F | Resp 20 | Ht 74.0 in | Wt 222.1 lb

## 2017-03-02 DIAGNOSIS — N183 Chronic kidney disease, stage 3 unspecified: Secondary | ICD-10-CM

## 2017-03-02 DIAGNOSIS — Z992 Dependence on renal dialysis: Secondary | ICD-10-CM

## 2017-03-02 DIAGNOSIS — Z48812 Encounter for surgical aftercare following surgery on the circulatory system: Secondary | ICD-10-CM | POA: Diagnosis present

## 2017-03-02 DIAGNOSIS — N186 End stage renal disease: Secondary | ICD-10-CM

## 2017-03-02 NOTE — Progress Notes (Signed)
   Patient name: Jason [name] MRN: [id number] DOB: 1960/08/06 Sex: male  REASON FOR VISIT:   Follow-up after right brachiocephalic AV fistula  HPI:   Jason [name] is a pleasant 57 y.o. male who had a tunneled dialysis catheter placed and a right brachiocephalic fistula on 07/30/9468.  He comes in for a follow-up visit.  He denies any pain or paresthesias in his right arm.  He is on dialysis Tuesdays Thursdays and Saturdays.  Current Outpatient Medications  Medication Sig Dispense Refill  . amLODipine (NORVASC) 10 MG tablet Take 1 tablet (10 mg total) by mouth daily. 90 tablet 1  . carvedilol (COREG) 25 MG tablet Take 1 tablet (25 mg total) by mouth 2 (two) times daily with a meal. 90 tablet 1  . cloNIDine (CATAPRES) 0.1 MG tablet Take 1 tablet (0.1 mg total) by mouth 2 (two) times daily. 180 tablet 1  . Multiple Vitamin (MULTIVITAMIN WITH MINERALS) TABS tablet Take 1 tablet by mouth daily.    . Nutritional Supplements (FEEDING SUPPLEMENT, NEPRO CARB STEADY,) LIQD Take 237 mLs by mouth 3 (three) times daily with meals. 30 Can 0  . albuterol (PROVENTIL HFA;VENTOLIN HFA) 108 (90 Base) MCG/ACT inhaler Inhale 2 puffs into the lungs every 4 (four) hours as needed for wheezing or shortness of breath (cough, shortness of breath or wheezing.). (Patient not taking: Reported on 02/18/2017) 1 Inhaler 1  . calcium acetate (PHOSLO) 667 MG capsule Take 1 capsule (667 mg total) by mouth 3 (three) times daily with meals. (Patient not taking: Reported on 02/18/2017) 90 capsule 1  . isosorbide mononitrate (IMDUR) 60 MG 24 hr tablet Take 1 tablet (60 mg total) by mouth daily. (Patient not taking: Reported on 02/18/2017) 90 tablet 1   No current facility-administered medications for this visit.     REVIEW OF SYSTEMS:  [X]  denotes positive finding, [ ]  denotes negative finding Cardiac  Comments:  Chest pain or chest pressure:    Shortness of breath upon exertion:    Short of breath when lying flat:    Irregular  heart rhythm:    Constitutional    Fever or chills:     PHYSICAL EXAM:   Vitals:   03/02/17 1600  BP: (!) 183/95  Pulse: 66  Resp: 20  Temp: 98.5 F (36.9 C)  TempSrc: Oral  SpO2: 93%  Weight: 222 lb 1.6 oz (100.7 kg)  Height: 6\' 2"  (1.88 m)    GENERAL: The patient is a well-nourished male, in no acute distress. The vital signs are documented above. CARDIOVASCULAR: There is a regular rate and rhythm. PULMONARY: There is good air exchange bilaterally without wheezing or rales. He has an excellent thrill in his right upper arm fistula which appears to be maturing adequately.  It is somewhat tortuous. He has a palpable right radial pulse.  DATA:   DUPLEX OF RIGHT BRACHIOCEPHALIC AV FISTULA: I have independently interpreted the duplex of his right brachiocephalic AV fistula.  The diameters of the fistula range from 0.5-0.6 cm.  There are some elevated velocities in the distal upper arm and mid upper arm.  MEDICAL ISSUES:   END-STAGE RENAL DISEASE: The patient is doing well status post placement of a right brachiocephalic AV fistula.  I think they can begin using this for dialysis starting April 16.  I will see him back as needed.  Jason [name] [facility] [phone]

## 2017-03-04 ENCOUNTER — Other Ambulatory Visit (HOSPITAL_COMMUNITY)
Admission: RE | Admit: 2017-03-04 | Discharge: 2017-03-04 | Disposition: A | Discharge status: Home / Self Care | Payer: Medicaid Other | Source: Other Acute Inpatient Hospital | Attending: Internal Medicine | Admitting: Internal Medicine

## 2017-03-04 ENCOUNTER — Encounter (HOSPITAL_BASED_OUTPATIENT_CLINIC_OR_DEPARTMENT_OTHER): Payer: Medicaid Other | Attending: Internal Medicine

## 2017-03-04 DIAGNOSIS — L97212 Non-pressure chronic ulcer of right calf with fat layer exposed: Secondary | ICD-10-CM | POA: Diagnosis not present

## 2017-03-04 DIAGNOSIS — L97823 Non-pressure chronic ulcer of other part of left lower leg with necrosis of muscle: Secondary | ICD-10-CM | POA: Diagnosis not present

## 2017-03-04 DIAGNOSIS — L97223 Non-pressure chronic ulcer of left calf with necrosis of muscle: Secondary | ICD-10-CM | POA: Insufficient documentation

## 2017-03-04 DIAGNOSIS — L97329 Non-pressure chronic ulcer of left ankle with unspecified severity: Secondary | ICD-10-CM | POA: Insufficient documentation

## 2017-03-04 DIAGNOSIS — I890 Lymphedema, not elsewhere classified: Secondary | ICD-10-CM | POA: Diagnosis not present

## 2017-03-04 DIAGNOSIS — L97822 Non-pressure chronic ulcer of other part of left lower leg with fat layer exposed: Secondary | ICD-10-CM | POA: Diagnosis not present

## 2017-03-04 DIAGNOSIS — L97812 Non-pressure chronic ulcer of other part of right lower leg with fat layer exposed: Secondary | ICD-10-CM | POA: Insufficient documentation

## 2017-03-04 DIAGNOSIS — I87333 Chronic venous hypertension (idiopathic) with ulcer and inflammation of bilateral lower extremity: Secondary | ICD-10-CM | POA: Insufficient documentation

## 2017-03-04 DIAGNOSIS — N186 End stage renal disease: Secondary | ICD-10-CM | POA: Diagnosis not present

## 2017-03-04 DIAGNOSIS — Z86718 Personal history of other venous thrombosis and embolism: Secondary | ICD-10-CM | POA: Insufficient documentation

## 2017-03-04 DIAGNOSIS — I87313 Chronic venous hypertension (idiopathic) with ulcer of bilateral lower extremity: Secondary | ICD-10-CM | POA: Diagnosis not present

## 2017-03-04 DIAGNOSIS — L97222 Non-pressure chronic ulcer of left calf with fat layer exposed: Secondary | ICD-10-CM | POA: Diagnosis not present

## 2017-03-07 LAB — AEROBIC CULTURE W GRAM STAIN (SUPERFICIAL SPECIMEN): Gram Stain: NONE SEEN

## 2017-03-07 LAB — AEROBIC CULTURE  (SUPERFICIAL SPECIMEN)

## 2017-03-11 ENCOUNTER — Encounter (HOSPITAL_BASED_OUTPATIENT_CLINIC_OR_DEPARTMENT_OTHER): Payer: Medicaid Other | Attending: Internal Medicine

## 2017-03-11 DIAGNOSIS — Z992 Dependence on renal dialysis: Secondary | ICD-10-CM | POA: Insufficient documentation

## 2017-03-11 DIAGNOSIS — I87313 Chronic venous hypertension (idiopathic) with ulcer of bilateral lower extremity: Secondary | ICD-10-CM | POA: Diagnosis not present

## 2017-03-11 DIAGNOSIS — L97822 Non-pressure chronic ulcer of other part of left lower leg with fat layer exposed: Secondary | ICD-10-CM | POA: Diagnosis not present

## 2017-03-11 DIAGNOSIS — Z87891 Personal history of nicotine dependence: Secondary | ICD-10-CM | POA: Insufficient documentation

## 2017-03-11 DIAGNOSIS — N186 End stage renal disease: Secondary | ICD-10-CM | POA: Insufficient documentation

## 2017-03-11 DIAGNOSIS — L97219 Non-pressure chronic ulcer of right calf with unspecified severity: Secondary | ICD-10-CM | POA: Insufficient documentation

## 2017-03-11 DIAGNOSIS — Z86718 Personal history of other venous thrombosis and embolism: Secondary | ICD-10-CM | POA: Diagnosis not present

## 2017-03-11 DIAGNOSIS — I87333 Chronic venous hypertension (idiopathic) with ulcer and inflammation of bilateral lower extremity: Secondary | ICD-10-CM | POA: Insufficient documentation

## 2017-03-11 DIAGNOSIS — I890 Lymphedema, not elsewhere classified: Secondary | ICD-10-CM | POA: Diagnosis not present

## 2017-03-11 DIAGNOSIS — I132 Hypertensive heart and chronic kidney disease with heart failure and with stage 5 chronic kidney disease, or end stage renal disease: Secondary | ICD-10-CM | POA: Insufficient documentation

## 2017-03-11 DIAGNOSIS — L97229 Non-pressure chronic ulcer of left calf with unspecified severity: Secondary | ICD-10-CM | POA: Insufficient documentation

## 2017-03-11 DIAGNOSIS — L97812 Non-pressure chronic ulcer of other part of right lower leg with fat layer exposed: Secondary | ICD-10-CM | POA: Insufficient documentation

## 2017-03-11 DIAGNOSIS — I509 Heart failure, unspecified: Secondary | ICD-10-CM | POA: Diagnosis not present

## 2017-03-11 DIAGNOSIS — L97212 Non-pressure chronic ulcer of right calf with fat layer exposed: Secondary | ICD-10-CM | POA: Diagnosis not present

## 2017-03-11 DIAGNOSIS — L97222 Non-pressure chronic ulcer of left calf with fat layer exposed: Secondary | ICD-10-CM | POA: Diagnosis not present

## 2017-03-11 DIAGNOSIS — I129 Hypertensive chronic kidney disease with stage 1 through stage 4 chronic kidney disease, or unspecified chronic kidney disease: Secondary | ICD-10-CM | POA: Diagnosis not present

## 2017-03-11 DIAGNOSIS — L97819 Non-pressure chronic ulcer of other part of right lower leg with unspecified severity: Secondary | ICD-10-CM | POA: Diagnosis not present

## 2017-03-15 ENCOUNTER — Ambulatory Visit: Payer: Self-pay | Admitting: Family Medicine

## 2017-03-16 ENCOUNTER — Ambulatory Visit: Payer: Self-pay | Admitting: Family Medicine

## 2017-03-18 ENCOUNTER — Ambulatory Visit: Payer: Self-pay | Admitting: Infectious Diseases

## 2017-03-22 DIAGNOSIS — I87333 Chronic venous hypertension (idiopathic) with ulcer and inflammation of bilateral lower extremity: Secondary | ICD-10-CM | POA: Diagnosis not present

## 2017-03-22 DIAGNOSIS — L97822 Non-pressure chronic ulcer of other part of left lower leg with fat layer exposed: Secondary | ICD-10-CM | POA: Diagnosis not present

## 2017-03-22 DIAGNOSIS — L97819 Non-pressure chronic ulcer of other part of right lower leg with unspecified severity: Secondary | ICD-10-CM | POA: Diagnosis not present

## 2017-03-22 DIAGNOSIS — L97222 Non-pressure chronic ulcer of left calf with fat layer exposed: Secondary | ICD-10-CM | POA: Diagnosis not present

## 2017-03-29 DIAGNOSIS — I87333 Chronic venous hypertension (idiopathic) with ulcer and inflammation of bilateral lower extremity: Secondary | ICD-10-CM | POA: Diagnosis not present

## 2017-03-30 ENCOUNTER — Encounter: Payer: Self-pay | Admitting: Family Medicine

## 2017-03-30 ENCOUNTER — Ambulatory Visit (INDEPENDENT_AMBULATORY_CARE_PROVIDER_SITE_OTHER): Payer: Self-pay | Admitting: Family Medicine

## 2017-03-30 VITALS — BP 160/94 | HR 70 | Temp 98.8°F | Resp 14 | Ht 74.0 in | Wt 206.0 lb

## 2017-03-30 DIAGNOSIS — I10 Essential (primary) hypertension: Secondary | ICD-10-CM

## 2017-03-30 DIAGNOSIS — D638 Anemia in other chronic diseases classified elsewhere: Secondary | ICD-10-CM

## 2017-03-30 DIAGNOSIS — N186 End stage renal disease: Secondary | ICD-10-CM

## 2017-03-30 MED ORDER — CLONIDINE HCL 0.1 MG PO TABS
0.1000 mg | ORAL_TABLET | Freq: Once | ORAL | Status: AC
  Administered 2017-03-30: 0.1 mg via ORAL

## 2017-03-30 NOTE — Progress Notes (Signed)
Patient ID: Jason [name], male    DOB: 04-Mar-1960, 57 y.o.   MRN: [id number]  PCP: Scot Jun, FNP  Chief Complaint  Patient presents with  . Follow-up    Cellulitis and paperwork for DMV  . Hypertension    Subjective:  HPI Jason [name] is a 57 y.o. male with ESRD, Accelerated HTN, CHF, medication non-compliance presents for evaluation of cellulitis infection of bilateral legs. Today he reports improved compliance with HD. His last treatment was on yesterday. His BP on arrival today is elevated and he has not taken his medication. In review of recent office visits at other practices, his BP has consistently remained in the 510'C systolic and >58 diastolic in spite of his reported medication compliance. Cellulitis of the legs and wounds are improving. He was previously referred to wound care as his last wound culture grew staph aureus and he is now completing a one month course of vancomycin prescribed by wound care (per patient). He continues to experience intermittent dyspnea and lower extremities edema although marked improvement since discharge from hospital. He denies chest pain, new weakness, dizziness, productive cough, or PND. Social History   Socioeconomic History  . Marital status: Single    Spouse name: Not on file  . Number of children: Not on file  . Years of education: Not on file  . Highest education level: Not on file  Social Needs  . Financial resource strain: Not on file  . Food insecurity - worry: Not on file  . Food insecurity - inability: Not on file  . Transportation needs - medical: Not on file  . Transportation needs - non-medical: Not on file  Occupational History  . Not on file  Tobacco Use  . Smoking status: Former Research scientist (life sciences)  . Smokeless tobacco: Never Used  . Tobacco comment: quit smoking in 1997  Substance and Sexual Activity  . Alcohol use: No  . Drug use: No  . Sexual activity: Not on file  Other Topics Concern  . Not on file  Social History  Narrative  . Not on file    Family History  Problem Relation Age of Onset  . Heart attack Mother 38       CABG hx  . Heart disease Mother   . Hypertension Mother   . Healthy Father   . Healthy Brother    Review of Systems Pertinent negatives list in HPI   Patient Active Problem List   Diagnosis Date Noted  . Anasarca 02/18/2017  . ESRD (end stage renal disease) ([place])   . NSVT (nonsustained ventricular tachycardia) ([place])   . Evaluation by psychiatric service required 01/19/2017  . Elevated troponin 01/16/2017  . Acute CHF (congestive heart failure) ([place]) 01/16/2017  . Venous stasis ulcer ([place]) 01/16/2017  . Chronic kidney disease 02/27/2015  . Essential hypertension 02/26/2015  . Right inguinal hernia 02/26/2015  . Abnormal EKG 01/30/2015  . Anemia 01/29/2015    Allergies  Allergen Reactions  . Lisinopril Cough    Prior to Admission medications   Medication Sig Start Date End Date Taking? Authorizing Provider  amLODipine (NORVASC) 10 MG tablet Take 1 tablet (10 mg total) by mouth daily. 02/07/17  Yes Scot Jun, FNP  calcium acetate (PHOSLO) 667 MG capsule Take 1 capsule (667 mg total) by mouth 3 (three) times daily with meals. 02/07/17  Yes Scot Jun, FNP  carvedilol (COREG) 25 MG tablet Take 1 tablet (25 mg total) by mouth 2 (two) times daily with a  meal. 02/15/17  Yes Scot Jun, FNP  cloNIDine (CATAPRES) 0.1 MG tablet Take 1 tablet (0.1 mg total) by mouth 2 (two) times daily. 02/07/17  Yes Scot Jun, FNP  isosorbide mononitrate (IMDUR) 60 MG 24 hr tablet Take 1 tablet (60 mg total) by mouth daily. 02/07/17  Yes Scot Jun, FNP  Multiple Vitamin (MULTIVITAMIN WITH MINERALS) TABS tablet Take 1 tablet by mouth daily.   Yes [provider]  albuterol (PROVENTIL HFA;VENTOLIN HFA) 108 (90 Base) MCG/ACT inhaler Inhale 2 puffs into the lungs every 4 (four) hours as needed for wheezing or shortness of breath (cough, shortness of  breath or wheezing.). Patient not taking: Reported on 02/18/2017 02/07/17   Scot Jun, FNP  Nutritional Supplements (FEEDING SUPPLEMENT, NEPRO CARB STEADY,) LIQD Take 237 mLs by mouth 3 (three) times daily with meals. Patient not taking: Reported on 03/30/2017 01/26/17   [name], MD    Past Medical, Surgical Family and Social History reviewed and updated.    Objective:   Today's Vitals   03/30/17 0802 03/30/17 0832  BP: (!) 176/100 (!) 160/94  Pulse: 70   Resp: 14   Temp: 98.8 F (37.1 C)   TempSrc: Oral   SpO2: 98%   Weight: 206 lb (93.4 kg)   Height: 6\' 2"  (1.88 m)     Wt Readings from Last 3 Encounters:  03/30/17 206 lb (93.4 kg)  03/02/17 222 lb 1.6 oz (100.7 kg)  02/18/17 220 lb (99.8 kg)    Physical Exam  Constitutional: He appears well-developed and well-nourished.  HENT:  Head: Normocephalic and atraumatic.  Eyes: Pupils are equal, round, and reactive to light.  Neck: Normal range of motion. Neck supple.  Cardiovascular: Normal rate, regular rhythm, normal heart sounds and intact distal pulses.  Pulmonary/Chest: Effort normal and breath sounds normal. No respiratory distress. He has no rales. He exhibits no tenderness.  Abdominal: Soft. Bowel sounds are normal. He exhibits no distension.  Musculoskeletal:  Legs are completed wrap and dressed. Did not evaluate as patient chronic skin ulcer are treated by wound center   Skin: Skin is warm and dry.  Psychiatric: He has a normal mood and affect. His behavior is normal. Judgment and thought content normal.  Today, an improved focus on improving health and appropriate judgement relating to health.    Assessment & Plan:  1. ESRD (end stage renal disease) ([place]), last GFR 14.  Patient continues hemodialysis every Tuesday, Thursday, and Saturday.  Reinforced the importance of compliance.  Will recheck a CMP today to evaluate renal and liver functioning.  2. Anemia of chronic disease, checking CBC  3.  Accelerated hypertension, no medication taken prior to visit today. Given  cloNIDine (CATAPRES) tablet 0.1 mg. Reinforced the importance of medication compliance. .We have discussed target BP range and blood pressure goal. I have advised patient to check BP regularly and to call us back or report to clinic if the numbers are consistently higher than 140/90. We discussed the importance of compliance with medical therapy and DASH diet recommended, consequences of uncontrolled hypertension discussed.  Continue current BP medications   Orders Placed This Encounter  Procedures  . Comprehensive metabolic panel  . CBC    Carroll Sage. [name], MSN, FNP-C [facility]  [address] [place],  [id number] [phone]

## 2017-03-30 NOTE — Patient Instructions (Signed)
Keep all follow-up appointments and dialysis schedule.  I will review and complete DMV paperwork based on information from all recent medical visits.  It is imperative that you consistently take your blood pressure medications to improve blood pressure control.     Chronic Kidney Disease, Adult Chronic kidney disease (CKD) happens when the kidneys are damaged during a time of 3 or more months. The kidneys are two organs that do many important jobs in the body. These jobs include:  Removing wastes and extra fluids from the blood.  Making hormones that maintain the amount of fluid in your tissues and blood vessels.  Making sure that the body has the right amount of fluids and chemicals.  Most of the time, this condition does not go away, but it can usually be controlled. Steps must be taken to slow down the kidney damage or stop it from getting worse. Otherwise, the kidneys may stop working. Follow these instructions at home:  Follow your diet as told by your doctor. You may need to avoid alcohol, salty foods (sodium), and foods that are high in potassium, calcium, and protein.  Take over-the-counter and prescription medicines only as told by your doctor. Do not take any new medicines unless your doctor says you can do that. These include vitamins and minerals. ? Medicines and nutritional supplements can make kidney damage worse. ? Your doctor may need to change how much medicine you take.  Do not use any tobacco products. These include cigarettes, chewing tobacco, and e-cigarettes. If you need help quitting, ask your doctor.  Keep all follow-up visits as told by your doctor. This is important.  Check your blood pressure. Tell your doctor if there are changes to your blood pressure.  Get to a healthy weight. Stay at that weight. If you need help with this, ask your doctor.  Start or continue an exercise plan. Try to exercise at least 30 minutes a day, 5 days a week.  Stay  up-to-date with your shots (immunizations) as told by your doctor. Contact a doctor if:  Your symptoms get worse.  You have new symptoms. Get help right away if:  You have symptoms of end-stage kidney disease. These include: ? Headaches. ? Skin that is darker or lighter than normal. ? Numbness in your hands or feet. ? Easy bruising. ? Having hiccups often. ? Chest pain. ? Shortness of breath. ? Stopping of menstrual periods in women.  You have a fever.  You are making very little pee (urine).  You have pain or bleeding when you pee (urinate). This information is not intended to replace advice given to you by your health care provider. Make sure you discuss any questions you have with your health care provider. Document Released: 03/24/2009 Document Revised: 06/05/2015 Document Reviewed: 08/27/2011 Elsevier Interactive Patient Education  2017 Else[employer] Inc[employer]    Edema Edema is when you have too much fluid in your body or under your skin. Edema may make your legs, feet, and ankles swell up. Swelling is also common in looser tissues, like around your eyes. This is a common condition. It gets more common as you get older. There are many possible causes of edema. Eating too much salt (sodium) and being on your feet or sitting for a long time can cause edema in your legs, feet, and ankles. Hot weather may make edema worse. Edema is usually painless. Your skin may look swollen or shiny. Follow these instructions at home:  Keep the swollen body part raised (elevated)  above the level of your heart when you are sitting or lying down.  Do not sit still or stand for a long time.  Do not wear tight clothes. Do not wear garters on your upper legs.  Exercise your legs. This can help the swelling go down.  Wear elastic bandages or support stockings as told by your doctor.  Eat a low-salt (low-sodium) diet to reduce fluid as told by your doctor.  Depending on the cause of your swelling,  you may need to limit how much fluid you drink (fluid restriction).  Take over-the-counter and prescription medicines only as told by your doctor. Contact a doctor if:  Treatment is not working.  You have heart, liver, or kidney disease and have symptoms of edema.  You have sudden and unexplained weight gain. Get help right away if:  You have shortness of breath or chest pain.  You cannot breathe when you lie down.  You have pain, redness, or warmth in the swollen areas.  You have heart, liver, or kidney disease and get edema all of a sudden.  You have a fever and your symptoms get worse all of a sudden. Summary  Edema is when you have too much fluid in your body or under your skin.  Edema may make your legs, feet, and ankles swell up. Swelling is also common in looser tissues, like around your eyes.  Raise (elevate) the swollen body part above the level of your heart when you are sitting or lying down.  Follow your doctor's instructions about diet and how much fluid you can drink (fluid restriction). This information is not intended to replace advice given to you by your health care provider. Make sure you discuss any questions you have with your health care provider. Document Released: 06/16/2007 Document Revised: 01/16/2016 Document Reviewed: 01/16/2016 Elsevier Interactive Patient Education  2017 [employer].

## 2017-03-31 LAB — CBC
HEMATOCRIT: 31.8 % — AB (ref 37.5–51.0)
Hemoglobin: 10.5 g/dL — ABNORMAL LOW (ref 13.0–17.7)
MCH: 28.3 pg (ref 26.6–33.0)
MCHC: 33 g/dL (ref 31.5–35.7)
MCV: 86 fL (ref 79–97)
PLATELETS: 211 10*3/uL (ref 150–379)
RBC: 3.71 x10E6/uL — ABNORMAL LOW (ref 4.14–5.80)
RDW: 15.2 % (ref 12.3–15.4)
WBC: 5.6 10*3/uL (ref 3.4–10.8)

## 2017-03-31 LAB — COMPREHENSIVE METABOLIC PANEL
ALT: 23 IU/L (ref 0–44)
AST: 27 IU/L (ref 0–40)
Albumin/Globulin Ratio: 1.1 — ABNORMAL LOW (ref 1.2–2.2)
Albumin: 3.2 g/dL — ABNORMAL LOW (ref 3.5–5.5)
Alkaline Phosphatase: 46 IU/L (ref 39–117)
BUN / CREAT RATIO: 6 — AB (ref 9–20)
BUN: 25 mg/dL — AB (ref 6–24)
Bilirubin Total: 0.4 mg/dL (ref 0.0–1.2)
CO2: 30 mmol/L — AB (ref 20–29)
CREATININE: 4.1 mg/dL — AB (ref 0.76–1.27)
Calcium: 8.8 mg/dL (ref 8.7–10.2)
Chloride: 99 mmol/L (ref 96–106)
GFR, EST AFRICAN AMERICAN: 18 mL/min/{1.73_m2} — AB (ref >59)
GFR, EST NON AFRICAN AMERICAN: 15 mL/min/{1.73_m2} — AB (ref >59)
GLOBULIN, TOTAL: 2.9 g/dL (ref 1.5–4.5)
GLUCOSE: 91 mg/dL (ref 65–99)
Potassium: 4.2 mmol/L (ref 3.5–5.2)
SODIUM: 139 mmol/L (ref 134–144)
TOTAL PROTEIN: 6.1 g/dL (ref 6.0–8.5)

## 2017-04-01 ENCOUNTER — Other Ambulatory Visit: Payer: Self-pay | Admitting: Internal Medicine

## 2017-04-01 ENCOUNTER — Ambulatory Visit (HOSPITAL_COMMUNITY)
Admission: RE | Admit: 2017-04-01 | Discharge: 2017-04-01 | Disposition: A | Discharge status: Home / Self Care | Payer: Medicaid Other | Source: Ambulatory Visit | Attending: Vascular Surgery | Admitting: Vascular Surgery

## 2017-04-01 DIAGNOSIS — L97909 Non-pressure chronic ulcer of unspecified part of unspecified lower leg with unspecified severity: Secondary | ICD-10-CM | POA: Insufficient documentation

## 2017-04-01 DIAGNOSIS — I83009 Varicose veins of unspecified lower extremity with ulcer of unspecified site: Secondary | ICD-10-CM | POA: Diagnosis not present

## 2017-04-05 ENCOUNTER — Telehealth: Payer: Self-pay | Admitting: Family Medicine

## 2017-04-05 DIAGNOSIS — L97822 Non-pressure chronic ulcer of other part of left lower leg with fat layer exposed: Secondary | ICD-10-CM | POA: Diagnosis not present

## 2017-04-05 DIAGNOSIS — I87313 Chronic venous hypertension (idiopathic) with ulcer of bilateral lower extremity: Secondary | ICD-10-CM | POA: Diagnosis not present

## 2017-04-05 DIAGNOSIS — L97222 Non-pressure chronic ulcer of left calf with fat layer exposed: Secondary | ICD-10-CM | POA: Diagnosis not present

## 2017-04-05 DIAGNOSIS — I87333 Chronic venous hypertension (idiopathic) with ulcer and inflammation of bilateral lower extremity: Secondary | ICD-10-CM | POA: Diagnosis not present

## 2017-04-05 DIAGNOSIS — L97212 Non-pressure chronic ulcer of right calf with fat layer exposed: Secondary | ICD-10-CM | POA: Diagnosis not present

## 2017-04-05 MED FILL — ISOSORBIDE MN ER 60 MG TAB: 60 | 30 days supply | Qty: 30 | Fill #1

## 2017-04-05 NOTE — Telephone Encounter (Signed)
Contact patient to advise DMV paperwork is completed. The cost of paperwork is $10.00. Please make copies and provide patient with the original.  Jason [name]. [name], MSN, FNP-C [facility]  [address] [name], [place] [address] [phone]

## 2017-04-06 MED FILL — ?CARVEDILOL 25 MG TABLET: 25 | 30 days supply | Qty: 60 | Fill #1

## 2017-04-06 MED FILL — AMLODIPINE BESYLATE 10 MG T: 10 | 30 days supply | Qty: 30 | Fill #1

## 2017-04-06 NOTE — Telephone Encounter (Signed)
Left a vm that paperwork is ready for pick up and the cost is 10 dollars.

## 2017-04-11 DIAGNOSIS — Z992 Dependence on renal dialysis: Secondary | ICD-10-CM | POA: Diagnosis not present

## 2017-04-11 DIAGNOSIS — I129 Hypertensive chronic kidney disease with stage 1 through stage 4 chronic kidney disease, or unspecified chronic kidney disease: Secondary | ICD-10-CM | POA: Diagnosis not present

## 2017-04-11 DIAGNOSIS — N186 End stage renal disease: Secondary | ICD-10-CM | POA: Diagnosis not present

## 2017-04-12 ENCOUNTER — Encounter (HOSPITAL_BASED_OUTPATIENT_CLINIC_OR_DEPARTMENT_OTHER): Payer: Medicare Other | Attending: Internal Medicine

## 2017-04-12 DIAGNOSIS — I890 Lymphedema, not elsewhere classified: Secondary | ICD-10-CM | POA: Insufficient documentation

## 2017-04-12 DIAGNOSIS — I87313 Chronic venous hypertension (idiopathic) with ulcer of bilateral lower extremity: Secondary | ICD-10-CM | POA: Diagnosis not present

## 2017-04-12 DIAGNOSIS — D509 Iron deficiency anemia, unspecified: Secondary | ICD-10-CM | POA: Diagnosis not present

## 2017-04-12 DIAGNOSIS — L97229 Non-pressure chronic ulcer of left calf with unspecified severity: Secondary | ICD-10-CM | POA: Diagnosis not present

## 2017-04-12 DIAGNOSIS — N186 End stage renal disease: Secondary | ICD-10-CM | POA: Insufficient documentation

## 2017-04-12 DIAGNOSIS — Z992 Dependence on renal dialysis: Secondary | ICD-10-CM | POA: Diagnosis not present

## 2017-04-12 DIAGNOSIS — I110 Hypertensive heart disease with heart failure: Secondary | ICD-10-CM | POA: Insufficient documentation

## 2017-04-12 DIAGNOSIS — Z86718 Personal history of other venous thrombosis and embolism: Secondary | ICD-10-CM | POA: Diagnosis not present

## 2017-04-12 DIAGNOSIS — L97219 Non-pressure chronic ulcer of right calf with unspecified severity: Secondary | ICD-10-CM | POA: Diagnosis not present

## 2017-04-12 DIAGNOSIS — D631 Anemia in chronic kidney disease: Secondary | ICD-10-CM | POA: Diagnosis not present

## 2017-04-12 DIAGNOSIS — L97822 Non-pressure chronic ulcer of other part of left lower leg with fat layer exposed: Secondary | ICD-10-CM | POA: Diagnosis not present

## 2017-04-12 DIAGNOSIS — I87332 Chronic venous hypertension (idiopathic) with ulcer and inflammation of left lower extremity: Secondary | ICD-10-CM | POA: Insufficient documentation

## 2017-04-12 DIAGNOSIS — I120 Hypertensive chronic kidney disease with stage 5 chronic kidney disease or end stage renal disease: Secondary | ICD-10-CM | POA: Diagnosis not present

## 2017-04-12 DIAGNOSIS — I739 Peripheral vascular disease, unspecified: Secondary | ICD-10-CM | POA: Diagnosis not present

## 2017-04-12 DIAGNOSIS — I509 Heart failure, unspecified: Secondary | ICD-10-CM | POA: Diagnosis not present

## 2017-04-12 DIAGNOSIS — N2581 Secondary hyperparathyroidism of renal origin: Secondary | ICD-10-CM | POA: Diagnosis not present

## 2017-04-14 DIAGNOSIS — N2581 Secondary hyperparathyroidism of renal origin: Secondary | ICD-10-CM | POA: Diagnosis not present

## 2017-04-14 DIAGNOSIS — D509 Iron deficiency anemia, unspecified: Secondary | ICD-10-CM | POA: Diagnosis not present

## 2017-04-14 DIAGNOSIS — Z992 Dependence on renal dialysis: Secondary | ICD-10-CM | POA: Diagnosis not present

## 2017-04-14 DIAGNOSIS — D631 Anemia in chronic kidney disease: Secondary | ICD-10-CM | POA: Diagnosis not present

## 2017-04-14 DIAGNOSIS — N186 End stage renal disease: Secondary | ICD-10-CM | POA: Diagnosis not present

## 2017-04-16 DIAGNOSIS — D509 Iron deficiency anemia, unspecified: Secondary | ICD-10-CM | POA: Diagnosis not present

## 2017-04-16 DIAGNOSIS — N186 End stage renal disease: Secondary | ICD-10-CM | POA: Diagnosis not present

## 2017-04-16 DIAGNOSIS — N2581 Secondary hyperparathyroidism of renal origin: Secondary | ICD-10-CM | POA: Diagnosis not present

## 2017-04-16 DIAGNOSIS — D631 Anemia in chronic kidney disease: Secondary | ICD-10-CM | POA: Diagnosis not present

## 2017-04-16 DIAGNOSIS — Z992 Dependence on renal dialysis: Secondary | ICD-10-CM | POA: Diagnosis not present

## 2017-04-18 MED FILL — cloNIDine HCL 0.1 MG TABS: 0.1 | 30 days supply | Qty: 60 | Fill #1

## 2017-04-19 DIAGNOSIS — D509 Iron deficiency anemia, unspecified: Secondary | ICD-10-CM | POA: Diagnosis not present

## 2017-04-19 DIAGNOSIS — N186 End stage renal disease: Secondary | ICD-10-CM | POA: Diagnosis not present

## 2017-04-19 DIAGNOSIS — Z992 Dependence on renal dialysis: Secondary | ICD-10-CM | POA: Diagnosis not present

## 2017-04-19 DIAGNOSIS — D631 Anemia in chronic kidney disease: Secondary | ICD-10-CM | POA: Diagnosis not present

## 2017-04-19 DIAGNOSIS — N2581 Secondary hyperparathyroidism of renal origin: Secondary | ICD-10-CM | POA: Diagnosis not present

## 2017-04-21 DIAGNOSIS — Z992 Dependence on renal dialysis: Secondary | ICD-10-CM | POA: Diagnosis not present

## 2017-04-21 DIAGNOSIS — N2581 Secondary hyperparathyroidism of renal origin: Secondary | ICD-10-CM | POA: Diagnosis not present

## 2017-04-21 DIAGNOSIS — D631 Anemia in chronic kidney disease: Secondary | ICD-10-CM | POA: Diagnosis not present

## 2017-04-21 DIAGNOSIS — D509 Iron deficiency anemia, unspecified: Secondary | ICD-10-CM | POA: Diagnosis not present

## 2017-04-21 DIAGNOSIS — N186 End stage renal disease: Secondary | ICD-10-CM | POA: Diagnosis not present

## 2017-04-23 DIAGNOSIS — D509 Iron deficiency anemia, unspecified: Secondary | ICD-10-CM | POA: Diagnosis not present

## 2017-04-23 DIAGNOSIS — Z992 Dependence on renal dialysis: Secondary | ICD-10-CM | POA: Diagnosis not present

## 2017-04-23 DIAGNOSIS — D631 Anemia in chronic kidney disease: Secondary | ICD-10-CM | POA: Diagnosis not present

## 2017-04-23 DIAGNOSIS — N2581 Secondary hyperparathyroidism of renal origin: Secondary | ICD-10-CM | POA: Diagnosis not present

## 2017-04-23 DIAGNOSIS — N186 End stage renal disease: Secondary | ICD-10-CM | POA: Diagnosis not present

## 2017-04-25 DIAGNOSIS — D509 Iron deficiency anemia, unspecified: Secondary | ICD-10-CM | POA: Diagnosis not present

## 2017-04-25 DIAGNOSIS — N186 End stage renal disease: Secondary | ICD-10-CM | POA: Diagnosis not present

## 2017-04-25 DIAGNOSIS — Z992 Dependence on renal dialysis: Secondary | ICD-10-CM | POA: Diagnosis not present

## 2017-04-25 DIAGNOSIS — D631 Anemia in chronic kidney disease: Secondary | ICD-10-CM | POA: Diagnosis not present

## 2017-04-25 DIAGNOSIS — N2581 Secondary hyperparathyroidism of renal origin: Secondary | ICD-10-CM | POA: Diagnosis not present

## 2017-04-28 DIAGNOSIS — N186 End stage renal disease: Secondary | ICD-10-CM | POA: Diagnosis not present

## 2017-04-28 DIAGNOSIS — N2581 Secondary hyperparathyroidism of renal origin: Secondary | ICD-10-CM | POA: Diagnosis not present

## 2017-04-28 DIAGNOSIS — D509 Iron deficiency anemia, unspecified: Secondary | ICD-10-CM | POA: Diagnosis not present

## 2017-04-28 DIAGNOSIS — Z992 Dependence on renal dialysis: Secondary | ICD-10-CM | POA: Diagnosis not present

## 2017-04-28 DIAGNOSIS — D631 Anemia in chronic kidney disease: Secondary | ICD-10-CM | POA: Diagnosis not present

## 2017-04-29 DIAGNOSIS — I890 Lymphedema, not elsewhere classified: Secondary | ICD-10-CM | POA: Diagnosis not present

## 2017-04-29 DIAGNOSIS — I739 Peripheral vascular disease, unspecified: Secondary | ICD-10-CM | POA: Diagnosis not present

## 2017-04-29 DIAGNOSIS — L97822 Non-pressure chronic ulcer of other part of left lower leg with fat layer exposed: Secondary | ICD-10-CM | POA: Diagnosis not present

## 2017-04-29 DIAGNOSIS — I509 Heart failure, unspecified: Secondary | ICD-10-CM | POA: Diagnosis not present

## 2017-04-29 DIAGNOSIS — I87332 Chronic venous hypertension (idiopathic) with ulcer and inflammation of left lower extremity: Secondary | ICD-10-CM | POA: Diagnosis not present

## 2017-04-29 DIAGNOSIS — L928 Other granulomatous disorders of the skin and subcutaneous tissue: Secondary | ICD-10-CM | POA: Diagnosis not present

## 2017-04-29 DIAGNOSIS — I110 Hypertensive heart disease with heart failure: Secondary | ICD-10-CM | POA: Diagnosis not present

## 2017-04-30 DIAGNOSIS — N2581 Secondary hyperparathyroidism of renal origin: Secondary | ICD-10-CM | POA: Diagnosis not present

## 2017-04-30 DIAGNOSIS — D509 Iron deficiency anemia, unspecified: Secondary | ICD-10-CM | POA: Diagnosis not present

## 2017-04-30 DIAGNOSIS — D631 Anemia in chronic kidney disease: Secondary | ICD-10-CM | POA: Diagnosis not present

## 2017-04-30 DIAGNOSIS — N186 End stage renal disease: Secondary | ICD-10-CM | POA: Diagnosis not present

## 2017-04-30 DIAGNOSIS — Z992 Dependence on renal dialysis: Secondary | ICD-10-CM | POA: Diagnosis not present

## 2017-05-02 ENCOUNTER — Telehealth: Payer: Self-pay

## 2017-05-02 NOTE — Telephone Encounter (Signed)
Patient notified

## 2017-05-02 NOTE — Telephone Encounter (Signed)
-----   Message from Dorena Dew, [place] sent at 05/02/2017  8:38 AM EDT ----- Regarding: schedule appt Please schedule Mr. Himebaugh for an appointment with me. I have never seen this patient, yet requested to complete disability forms.   Donia Pounds  MSN, FNP-C [facility] [address] [place], [place] [address] [phone]

## 2017-05-02 NOTE — Telephone Encounter (Signed)
Left a vm for patient to callback 

## 2017-05-02 NOTE — Telephone Encounter (Signed)
-----   Message from Dorena Dew, [place] sent at 05/02/2017  8:38 AM EDT ----- Regarding: schedule appt Please schedule Jason [name] for an appointment with me. I have never seen this patient, yet requested to complete disability forms.   Donia Pounds  MSN, FNP-C [facility] [address] [place], [place] [address] [phone]

## 2017-05-03 DIAGNOSIS — D631 Anemia in chronic kidney disease: Secondary | ICD-10-CM | POA: Diagnosis not present

## 2017-05-03 DIAGNOSIS — N2581 Secondary hyperparathyroidism of renal origin: Secondary | ICD-10-CM | POA: Diagnosis not present

## 2017-05-03 DIAGNOSIS — D509 Iron deficiency anemia, unspecified: Secondary | ICD-10-CM | POA: Diagnosis not present

## 2017-05-03 DIAGNOSIS — N186 End stage renal disease: Secondary | ICD-10-CM | POA: Diagnosis not present

## 2017-05-03 DIAGNOSIS — Z992 Dependence on renal dialysis: Secondary | ICD-10-CM | POA: Diagnosis not present

## 2017-05-05 DIAGNOSIS — D631 Anemia in chronic kidney disease: Secondary | ICD-10-CM | POA: Diagnosis not present

## 2017-05-05 DIAGNOSIS — N2581 Secondary hyperparathyroidism of renal origin: Secondary | ICD-10-CM | POA: Diagnosis not present

## 2017-05-05 DIAGNOSIS — N186 End stage renal disease: Secondary | ICD-10-CM | POA: Diagnosis not present

## 2017-05-05 DIAGNOSIS — Z992 Dependence on renal dialysis: Secondary | ICD-10-CM | POA: Diagnosis not present

## 2017-05-05 DIAGNOSIS — D509 Iron deficiency anemia, unspecified: Secondary | ICD-10-CM | POA: Diagnosis not present

## 2017-05-06 ENCOUNTER — Ambulatory Visit (INDEPENDENT_AMBULATORY_CARE_PROVIDER_SITE_OTHER): Payer: Medicare Other | Admitting: Family Medicine

## 2017-05-06 ENCOUNTER — Encounter: Payer: Self-pay | Admitting: Family Medicine

## 2017-05-06 VITALS — BP 152/80 | HR 60 | Temp 97.5°F | Resp 16 | Ht 74.0 in | Wt 196.0 lb

## 2017-05-06 DIAGNOSIS — N186 End stage renal disease: Secondary | ICD-10-CM | POA: Diagnosis not present

## 2017-05-06 DIAGNOSIS — I739 Peripheral vascular disease, unspecified: Secondary | ICD-10-CM | POA: Diagnosis not present

## 2017-05-06 DIAGNOSIS — I10 Essential (primary) hypertension: Secondary | ICD-10-CM

## 2017-05-06 DIAGNOSIS — I509 Heart failure, unspecified: Secondary | ICD-10-CM | POA: Diagnosis not present

## 2017-05-06 DIAGNOSIS — L97822 Non-pressure chronic ulcer of other part of left lower leg with fat layer exposed: Secondary | ICD-10-CM | POA: Diagnosis not present

## 2017-05-06 DIAGNOSIS — K4030 Unilateral inguinal hernia, with obstruction, without gangrene, not specified as recurrent: Secondary | ICD-10-CM | POA: Diagnosis not present

## 2017-05-06 DIAGNOSIS — I87332 Chronic venous hypertension (idiopathic) with ulcer and inflammation of left lower extremity: Secondary | ICD-10-CM | POA: Diagnosis not present

## 2017-05-06 DIAGNOSIS — I890 Lymphedema, not elsewhere classified: Secondary | ICD-10-CM | POA: Diagnosis not present

## 2017-05-06 DIAGNOSIS — L97229 Non-pressure chronic ulcer of left calf with unspecified severity: Secondary | ICD-10-CM | POA: Diagnosis not present

## 2017-05-06 DIAGNOSIS — I110 Hypertensive heart disease with heart failure: Secondary | ICD-10-CM | POA: Diagnosis not present

## 2017-05-06 DIAGNOSIS — I87312 Chronic venous hypertension (idiopathic) with ulcer of left lower extremity: Secondary | ICD-10-CM | POA: Diagnosis not present

## 2017-05-06 LAB — BLOOD GAS, ARTERIAL
Acid-base deficit: 5.2 mmol/L — ABNORMAL HIGH (ref 0.0–2.0)
Bicarbonate: 19.8 mmol/L — ABNORMAL LOW (ref 20.0–28.0)
Drawn by: 103701
O2 Content: 3 L/min
O2 Saturation: 97 %
Patient temperature: 98.6
pCO2 arterial: 38.7 mmHg (ref 32.0–48.0)
pH, Arterial: 7.328 — ABNORMAL LOW (ref 7.350–7.450)
pO2, Arterial: 104 mmHg (ref 83.0–108.0)

## 2017-05-06 MED ORDER — NEPRO/CARBSTEADY PO LIQD: 237.0000 mL | Freq: Three times a day (TID) | ORAL | 2 refills | Status: DC

## 2017-05-06 NOTE — Progress Notes (Signed)
Chief Complaint  Patient presents with  . paperwork    Subjective:    Patient ID: Jason [name]., male    DOB: 1960/08/31, 57 y.o.   MRN: [id number]  HPI Jason [name], a 57 year old male with a history of end-stage renal disease, hypertension, congestive heart failure, and cellulitis presents for completion of disability forms and complaining of right hernia pain.  Patient reports that he has been taking all medications consistently over the past several weeks.  He says that he typically takes amlodipine at bedtime, but took all other antihypertensives prior to arrival.  He states that his blood pressure typically remains elevated and falls 409 and 811 systolically.  Patient undergoes dialysis on Monday, Wednesday, and Friday.  Patient is requesting completion of disability forms on today.  Patient also presents for evaluation of right inguinal hernia.  Jason [name] states that hernia has gotten progressively larger over the past several months.  He says that hernia is no longer reducible.  He has not been evaluated for this problem in the past.  Also, patient endorses periodic discomfort.  Patient denies fever, fatigue, abdominal pain, dysuria, difficulty urinating, incontinence of urine or stool, or chronic constipation.  He does not have a history of groin surgery. Past Medical History:  Diagnosis Date  . AKI (acute kidney injury) ([place]) 01/2015  . CHF (congestive heart failure) ([place])   . Hypertension    Social History   Socioeconomic History  . Marital status: Single    Spouse name: Not on file  . Number of children: Not on file  . Years of education: Not on file  . Highest education level: Not on file  Occupational History  . Not on file  Social Needs  . Financial resource strain: Not on file  . Food insecurity:    Worry: Not on file    Inability: Not on file  . Transportation needs:    Medical: Not on file    Non-medical: Not on file  Tobacco Use  . Smoking status: Former  Research scientist (life sciences)  . Smokeless tobacco: Never Used  . Tobacco comment: quit smoking in 1997  Substance and Sexual Activity  . Alcohol use: No  . Drug use: No  . Sexual activity: Not on file  Lifestyle  . Physical activity:    Days per week: Not on file    Minutes per session: Not on file  . Stress: Not on file  Relationships  . Social connections:    Talks on phone: Not on file    Gets together: Not on file    Attends religious service: Not on file    Active member of club or organization: Not on file    Attends meetings of clubs or organizations: Not on file    Relationship status: Not on file  . Intimate partner violence:    Fear of current or ex partner: Not on file    Emotionally abused: Not on file    Physically abused: Not on file    Forced sexual activity: Not on file  Other Topics Concern  . Not on file  Social History Narrative  . Not on file   Review of Systems  Constitutional: Negative.   HENT: Negative.   Eyes: Negative.   Respiratory: Negative.   Cardiovascular: Negative.   Endocrine: Negative.   Genitourinary: Negative for difficulty urinating, discharge, dysuria, hematuria, penile swelling, scrotal swelling, testicular pain and urgency.       Inguinal hernia  Musculoskeletal: Negative.  Allergic/Immunologic: Negative.   Neurological: Negative.   Hematological: Negative.   Psychiatric/Behavioral: Negative.        Objective:   Physical Exam  Constitutional: He is oriented to person, place, and time. He appears well-developed and well-nourished.  Eyes: Pupils are equal, round, and reactive to light.  Cardiovascular: Normal rate, regular rhythm, normal heart sounds and intact distal pulses.  Pulmonary/Chest: Effort normal and breath sounds normal.  Abdominal: Soft. Bowel sounds are normal. A hernia is present. Hernia confirmed positive in the right inguinal area.    Neurological: He is alert and oriented to person, place, and time.  Skin: Skin is warm and  dry.  Psychiatric: He has a normal mood and affect. His behavior is normal. Judgment and thought content normal.      BP (!) 152/80 (BP Location: Left Arm, Patient Position: Sitting, Cuff Size: Small)   Pulse 60   Temp (!) 97.5 F (36.4 C) (Oral)   Resp 16   Ht 6\' 2"  (1.88 m)   Wt 196 lb (88.9 kg)   SpO2 95%   BMI 25.16 kg/m  Assessment & Plan:  Irreducible right inguinal hernia Hernia irreducible and bulging on physical exam.  Patient warrants referral to general surgery for further work-up and evaluation - Ambulatory referral to General Surgery   Essential hypertension Blood pressure is at goal on current medication regimen, no medication changes warranted on today. - Basic Metabolic Panel - CBC with Differential   ESRD (end stage renal disease) ([place]) Continue dialysis as scheduled - Nutritional Supplements (FEEDING SUPPLEMENT, NEPRO CARB STEADY,) LIQD; Take 237 mLs by mouth 3 (three) times daily with meals.  Dispense: 30 Can; Refill: 2    [employer] disability forms completed during appointment   RTC: Follow-up in office as previously scheduled for chronic conditions   Donia Pounds  MSN, FNP-C [facility] [address] [place], [place] [address] [phone]

## 2017-05-06 NOTE — Patient Instructions (Signed)
You have a right inguinal hernia that is irreducible, you may warrant surgical repair.  I have sent an urgent referral to general surgery.  If you have not heard from them within 1 week please give [place] a call.  Refrain from lifting any objects greater than 20 pounds.  Also, refrain from straining with defecation or urination. Continue dialysis as previously schedule. Take all antihypertensive medications consistently in order to achieve positive outcomes.  Please continue to follow a renal diet.

## 2017-05-07 DIAGNOSIS — D631 Anemia in chronic kidney disease: Secondary | ICD-10-CM | POA: Diagnosis not present

## 2017-05-07 DIAGNOSIS — N186 End stage renal disease: Secondary | ICD-10-CM | POA: Diagnosis not present

## 2017-05-07 DIAGNOSIS — D509 Iron deficiency anemia, unspecified: Secondary | ICD-10-CM | POA: Diagnosis not present

## 2017-05-07 DIAGNOSIS — N2581 Secondary hyperparathyroidism of renal origin: Secondary | ICD-10-CM | POA: Diagnosis not present

## 2017-05-07 DIAGNOSIS — Z992 Dependence on renal dialysis: Secondary | ICD-10-CM | POA: Diagnosis not present

## 2017-05-07 LAB — CBC WITH DIFFERENTIAL/PLATELET
BASOS ABS: 0 10*3/uL (ref 0.0–0.2)
Basos: 1 %
EOS (ABSOLUTE): 0.4 10*3/uL (ref 0.0–0.4)
Eos: 7 %
HEMOGLOBIN: 9.9 g/dL — AB (ref 13.0–17.7)
Hematocrit: 30.6 % — ABNORMAL LOW (ref 37.5–51.0)
IMMATURE GRANS (ABS): 0 10*3/uL (ref 0.0–0.1)
Immature Granulocytes: 0 %
LYMPHS ABS: 0.8 10*3/uL (ref 0.7–3.1)
LYMPHS: 17 %
MCH: 28.4 pg (ref 26.6–33.0)
MCHC: 32.4 g/dL (ref 31.5–35.7)
MCV: 88 fL (ref 79–97)
MONOCYTES: 9 %
Monocytes Absolute: 0.4 10*3/uL (ref 0.1–0.9)
NEUTROS PCT: 66 %
Neutrophils Absolute: 3.2 10*3/uL (ref 1.4–7.0)
Platelets: 211 10*3/uL (ref 150–379)
RBC: 3.48 x10E6/uL — AB (ref 4.14–5.80)
RDW: 16.2 % — ABNORMAL HIGH (ref 12.3–15.4)
WBC: 4.9 10*3/uL (ref 3.4–10.8)

## 2017-05-07 LAB — BASIC METABOLIC PANEL
BUN/Creatinine Ratio: 5 — ABNORMAL LOW (ref 9–20)
BUN: 23 mg/dL (ref 6–24)
CALCIUM: 9.3 mg/dL (ref 8.7–10.2)
CO2: 30 mmol/L — AB (ref 20–29)
Chloride: 99 mmol/L (ref 96–106)
Creatinine, Ser: 4.46 mg/dL — ABNORMAL HIGH (ref 0.76–1.27)
GFR calc Af Amer: 16 mL/min/{1.73_m2} — ABNORMAL LOW (ref >59)
GFR calc non Af Amer: 14 mL/min/{1.73_m2} — ABNORMAL LOW (ref >59)
GLUCOSE: 94 mg/dL (ref 65–99)
Potassium: 3.5 mmol/L (ref 3.5–5.2)
Sodium: 141 mmol/L (ref 134–144)

## 2017-05-09 ENCOUNTER — Telehealth: Payer: Self-pay

## 2017-05-09 DIAGNOSIS — Z452 Encounter for adjustment and management of vascular access device: Secondary | ICD-10-CM | POA: Diagnosis not present

## 2017-05-09 NOTE — Telephone Encounter (Signed)
-----   Message from Dorena Dew, [place] sent at 05/07/2017  5:52 PM EDT ----- Regarding: lab results Please fax labs to [facility]. Patient will also need a fist available appointment at [facility] for right inguinal hernia.   Donia Pounds  MSN, FNP-C [facility] [address] [place], [place] [address] [phone]

## 2017-05-09 NOTE — Telephone Encounter (Signed)
Referral and notes have been faxed for STAT appointment

## 2017-05-10 DIAGNOSIS — D509 Iron deficiency anemia, unspecified: Secondary | ICD-10-CM | POA: Diagnosis not present

## 2017-05-10 DIAGNOSIS — N2581 Secondary hyperparathyroidism of renal origin: Secondary | ICD-10-CM | POA: Diagnosis not present

## 2017-05-10 DIAGNOSIS — Z992 Dependence on renal dialysis: Secondary | ICD-10-CM | POA: Diagnosis not present

## 2017-05-10 DIAGNOSIS — N186 End stage renal disease: Secondary | ICD-10-CM | POA: Diagnosis not present

## 2017-05-10 DIAGNOSIS — D631 Anemia in chronic kidney disease: Secondary | ICD-10-CM | POA: Diagnosis not present

## 2017-05-11 DIAGNOSIS — Z992 Dependence on renal dialysis: Secondary | ICD-10-CM | POA: Diagnosis not present

## 2017-05-11 DIAGNOSIS — N186 End stage renal disease: Secondary | ICD-10-CM | POA: Diagnosis not present

## 2017-05-11 DIAGNOSIS — I129 Hypertensive chronic kidney disease with stage 1 through stage 4 chronic kidney disease, or unspecified chronic kidney disease: Secondary | ICD-10-CM | POA: Diagnosis not present

## 2017-05-12 DIAGNOSIS — N2581 Secondary hyperparathyroidism of renal origin: Secondary | ICD-10-CM | POA: Diagnosis not present

## 2017-05-12 DIAGNOSIS — Z992 Dependence on renal dialysis: Secondary | ICD-10-CM | POA: Diagnosis not present

## 2017-05-12 DIAGNOSIS — N186 End stage renal disease: Secondary | ICD-10-CM | POA: Diagnosis not present

## 2017-05-12 DIAGNOSIS — D509 Iron deficiency anemia, unspecified: Secondary | ICD-10-CM | POA: Diagnosis not present

## 2017-05-12 DIAGNOSIS — D631 Anemia in chronic kidney disease: Secondary | ICD-10-CM | POA: Diagnosis not present

## 2017-05-13 ENCOUNTER — Encounter (HOSPITAL_BASED_OUTPATIENT_CLINIC_OR_DEPARTMENT_OTHER): Payer: Medicare Other | Attending: Internal Medicine

## 2017-05-13 DIAGNOSIS — Z992 Dependence on renal dialysis: Secondary | ICD-10-CM | POA: Insufficient documentation

## 2017-05-13 DIAGNOSIS — I509 Heart failure, unspecified: Secondary | ICD-10-CM | POA: Diagnosis not present

## 2017-05-13 DIAGNOSIS — Z86718 Personal history of other venous thrombosis and embolism: Secondary | ICD-10-CM | POA: Diagnosis not present

## 2017-05-13 DIAGNOSIS — I87313 Chronic venous hypertension (idiopathic) with ulcer of bilateral lower extremity: Secondary | ICD-10-CM | POA: Diagnosis not present

## 2017-05-13 DIAGNOSIS — L97829 Non-pressure chronic ulcer of other part of left lower leg with unspecified severity: Secondary | ICD-10-CM | POA: Insufficient documentation

## 2017-05-13 DIAGNOSIS — I132 Hypertensive heart and chronic kidney disease with heart failure and with stage 5 chronic kidney disease, or end stage renal disease: Secondary | ICD-10-CM | POA: Insufficient documentation

## 2017-05-13 DIAGNOSIS — I890 Lymphedema, not elsewhere classified: Secondary | ICD-10-CM | POA: Diagnosis not present

## 2017-05-13 DIAGNOSIS — N186 End stage renal disease: Secondary | ICD-10-CM | POA: Insufficient documentation

## 2017-05-13 DIAGNOSIS — L97822 Non-pressure chronic ulcer of other part of left lower leg with fat layer exposed: Secondary | ICD-10-CM | POA: Diagnosis not present

## 2017-05-13 MED FILL — CARVEDILOL 25 MG TABLET: 25 | 30 days supply | Qty: 60 | Fill #2

## 2017-05-13 MED FILL — AMLODIPINE BESYLATE 10 MG T: 10 | 30 days supply | Qty: 30 | Fill #2

## 2017-05-13 MED FILL — CALCIUM ACETATE 667 MG CAP: 667 | 30 days supply | Qty: 90 | Fill #1

## 2017-05-13 MED FILL — ISOSORBIDE MN ER 60 MG TAB: 60 | 30 days supply | Qty: 30 | Fill #2

## 2017-05-14 DIAGNOSIS — D631 Anemia in chronic kidney disease: Secondary | ICD-10-CM | POA: Diagnosis not present

## 2017-05-14 DIAGNOSIS — N2581 Secondary hyperparathyroidism of renal origin: Secondary | ICD-10-CM | POA: Diagnosis not present

## 2017-05-14 DIAGNOSIS — Z992 Dependence on renal dialysis: Secondary | ICD-10-CM | POA: Diagnosis not present

## 2017-05-14 DIAGNOSIS — D509 Iron deficiency anemia, unspecified: Secondary | ICD-10-CM | POA: Diagnosis not present

## 2017-05-14 DIAGNOSIS — N186 End stage renal disease: Secondary | ICD-10-CM | POA: Diagnosis not present

## 2017-05-17 DIAGNOSIS — D509 Iron deficiency anemia, unspecified: Secondary | ICD-10-CM | POA: Diagnosis not present

## 2017-05-17 DIAGNOSIS — D631 Anemia in chronic kidney disease: Secondary | ICD-10-CM | POA: Diagnosis not present

## 2017-05-17 DIAGNOSIS — N2581 Secondary hyperparathyroidism of renal origin: Secondary | ICD-10-CM | POA: Diagnosis not present

## 2017-05-17 DIAGNOSIS — N186 End stage renal disease: Secondary | ICD-10-CM | POA: Diagnosis not present

## 2017-05-17 DIAGNOSIS — Z992 Dependence on renal dialysis: Secondary | ICD-10-CM | POA: Diagnosis not present

## 2017-05-19 DIAGNOSIS — Z992 Dependence on renal dialysis: Secondary | ICD-10-CM | POA: Diagnosis not present

## 2017-05-19 DIAGNOSIS — D631 Anemia in chronic kidney disease: Secondary | ICD-10-CM | POA: Diagnosis not present

## 2017-05-19 DIAGNOSIS — D509 Iron deficiency anemia, unspecified: Secondary | ICD-10-CM | POA: Diagnosis not present

## 2017-05-19 DIAGNOSIS — N2581 Secondary hyperparathyroidism of renal origin: Secondary | ICD-10-CM | POA: Diagnosis not present

## 2017-05-19 DIAGNOSIS — N186 End stage renal disease: Secondary | ICD-10-CM | POA: Diagnosis not present

## 2017-05-20 DIAGNOSIS — L97829 Non-pressure chronic ulcer of other part of left lower leg with unspecified severity: Secondary | ICD-10-CM | POA: Diagnosis not present

## 2017-05-20 DIAGNOSIS — N186 End stage renal disease: Secondary | ICD-10-CM | POA: Diagnosis not present

## 2017-05-20 DIAGNOSIS — I87312 Chronic venous hypertension (idiopathic) with ulcer of left lower extremity: Secondary | ICD-10-CM | POA: Diagnosis not present

## 2017-05-20 DIAGNOSIS — I890 Lymphedema, not elsewhere classified: Secondary | ICD-10-CM | POA: Diagnosis not present

## 2017-05-20 DIAGNOSIS — Z86718 Personal history of other venous thrombosis and embolism: Secondary | ICD-10-CM | POA: Diagnosis not present

## 2017-05-20 DIAGNOSIS — I132 Hypertensive heart and chronic kidney disease with heart failure and with stage 5 chronic kidney disease, or end stage renal disease: Secondary | ICD-10-CM | POA: Diagnosis not present

## 2017-05-20 DIAGNOSIS — I509 Heart failure, unspecified: Secondary | ICD-10-CM | POA: Diagnosis not present

## 2017-05-24 DIAGNOSIS — N186 End stage renal disease: Secondary | ICD-10-CM | POA: Diagnosis not present

## 2017-05-24 DIAGNOSIS — D631 Anemia in chronic kidney disease: Secondary | ICD-10-CM | POA: Diagnosis not present

## 2017-05-24 DIAGNOSIS — Z992 Dependence on renal dialysis: Secondary | ICD-10-CM | POA: Diagnosis not present

## 2017-05-24 DIAGNOSIS — N2581 Secondary hyperparathyroidism of renal origin: Secondary | ICD-10-CM | POA: Diagnosis not present

## 2017-05-24 DIAGNOSIS — D509 Iron deficiency anemia, unspecified: Secondary | ICD-10-CM | POA: Diagnosis not present

## 2017-05-26 DIAGNOSIS — N2581 Secondary hyperparathyroidism of renal origin: Secondary | ICD-10-CM | POA: Diagnosis not present

## 2017-05-26 DIAGNOSIS — D631 Anemia in chronic kidney disease: Secondary | ICD-10-CM | POA: Diagnosis not present

## 2017-05-26 DIAGNOSIS — D509 Iron deficiency anemia, unspecified: Secondary | ICD-10-CM | POA: Diagnosis not present

## 2017-05-26 DIAGNOSIS — N186 End stage renal disease: Secondary | ICD-10-CM | POA: Diagnosis not present

## 2017-05-26 DIAGNOSIS — Z992 Dependence on renal dialysis: Secondary | ICD-10-CM | POA: Diagnosis not present

## 2017-05-30 ENCOUNTER — Ambulatory Visit: Payer: Self-pay | Admitting: Surgery

## 2017-05-30 ENCOUNTER — Encounter: Payer: Self-pay | Admitting: Surgery

## 2017-05-30 ENCOUNTER — Ambulatory Visit: Payer: Self-pay | Admitting: Family Medicine

## 2017-05-30 DIAGNOSIS — N186 End stage renal disease: Secondary | ICD-10-CM | POA: Diagnosis not present

## 2017-05-30 DIAGNOSIS — Z01818 Encounter for other preprocedural examination: Secondary | ICD-10-CM | POA: Diagnosis not present

## 2017-05-30 DIAGNOSIS — K429 Umbilical hernia without obstruction or gangrene: Secondary | ICD-10-CM | POA: Diagnosis not present

## 2017-05-30 DIAGNOSIS — K4090 Unilateral inguinal hernia, without obstruction or gangrene, not specified as recurrent: Secondary | ICD-10-CM | POA: Insufficient documentation

## 2017-05-30 DIAGNOSIS — Z992 Dependence on renal dialysis: Secondary | ICD-10-CM | POA: Diagnosis not present

## 2017-05-30 DIAGNOSIS — I770 Arteriovenous fistula, acquired: Secondary | ICD-10-CM | POA: Insufficient documentation

## 2017-05-30 NOTE — H&P (Signed)
[name] Documented: 05/30/2017 9:29 AM Location: [facility] Patient #: [id number] DOB: [birth year] Single / Language: Cleophus Molt / Race: Black or African American Male  History of Present Illness Adin Hector MD; 05/30/2017 10:19 AM) The patient is a 57 year old male who presents with an inguinal hernia. Note for "Inguinal hernia": ` ` ` Patient sent for surgical consultation at the request of Donia Pounds MSN, FNP-C  Chief Complaint: Inguinal hernia  The patient is a 57 year old male with end-stage renal disease dialysis dependent. Sounds like he was relying on holistic doctors but has transitioned back to allopathic medicine. Getting hypertension and kidney disease under control. Gets dialysis on Tuesdays Thursdays Saturdays now. Hypertension. Noted right groin bulging about 8 years ago. It has gradually gotten larger. Worse over the past few months. It is out all the time. Getting more difficult to reduce. Mentioned to his primary care provider. Large inguinal hernia noted Surgical consultation requested. He did have some chronic leg ulcers that have been followed by wound ostomy care. Had to wear a wound care boot for several months. However they are healed up and there is one small scab on his left leg. No other infections. Just has 1 small scab remaining. He used to smoke. He quit. He is not on any blood thinners. He claims he moves his bowels about twice a day. No prior abdominal surgery. Claims he can walk 20-30 minutes without much difficulty.   (Review of systems as stated in this history (HPI) or in the review of systems. Otherwise all other 12 point ROS are negative) ` ` `   Past Surgical History Illene Regulus, CMA; 05/30/2017 10:08 AM) Dialysis Shunt / Fistula  Diagnostic Studies History Lars Mage Spillers, CMA; 05/30/2017 10:08 AM) Colonoscopy within last year  Allergies (Tanisha A. Owens Shark, [place]; 05/30/2017 9:33  AM) Lisinopril *ANTIHYPERTENSIVES* Allergies Reconciled  Medication History (Tanisha A. Owens Shark, [place]; 05/30/2017 9:33 AM) Carvedilol (25MG  Tablet, Oral) Active. AmLODIPine Besylate (10MG  Tablet, Oral) Active. Calcium Acetate (Phos Binder) (667MG  Capsule, Oral) Active. Isosorbide Mononitrate ER (60MG  Tablet ER 24HR, Oral) Active. Rena-Vite (Oral) Active. Medications Reconciled  Social History Illene Regulus, CMA; 05/30/2017 10:08 AM) Alcohol use Remotely quit alcohol use. Caffeine use Coffee, Tea. No drug use Tobacco use Former smoker.  Family History Illene Regulus, [place]; 05/30/2017 10:08 AM) Arthritis Mother. Cerebrovascular Accident Mother. Heart Disease Mother. Hypertension Mother.  Other Problems Lars Mage Spillers, CMA; 05/30/2017 10:08 AM) Chronic Renal Failure Syndrome Congestive Heart Failure High blood pressure     Review of Systems (Alisha Spillers CMA; 05/30/2017 10:08 AM) General Not Present- Appetite Loss, Chills, Fatigue, Fever, Night Sweats, Weight Gain and Weight Loss. Skin Present- Non-Healing Wounds. Not Present- Change in Wart/Mole, Dryness, Hives, Jaundice, New Lesions, Rash and Ulcer. HEENT Not Present- Earache, Hearing Loss, Hoarseness, Nose Bleed, Oral Ulcers, Ringing in the Ears, Seasonal Allergies, Sinus Pain, Sore Throat, Visual Disturbances, Wears glasses/contact lenses and Yellow Eyes. Respiratory Not Present- Bloody sputum, Chronic Cough, Difficulty Breathing, Snoring and Wheezing. Breast Not Present- Breast Mass, Breast Pain, Nipple Discharge and Skin Changes. Cardiovascular Not Present- Chest Pain, Difficulty Breathing Lying Down, Leg Cramps, Palpitations, Rapid Heart Rate, Shortness of Breath and Swelling of Extremities. Gastrointestinal Not Present- Abdominal Pain, Bloating, Bloody Stool, Change in Bowel Habits, Chronic diarrhea, Constipation, Difficulty Swallowing, Excessive gas, Gets full quickly at meals, Hemorrhoids,  Indigestion, Nausea, Rectal Pain and Vomiting. Male Genitourinary Not Present- Blood in Urine, Change in Urinary Stream, Frequency, Impotence, Nocturia, Painful Urination, Urgency and Urine  Leakage. Musculoskeletal Not Present- Back Pain, Joint Pain, Joint Stiffness, Muscle Pain, Muscle Weakness and Swelling of Extremities. Neurological Not Present- Decreased Memory, Fainting, Headaches, Numbness, Seizures, Tingling, Tremor, Trouble walking and Weakness. Psychiatric Not Present- Anxiety, Bipolar, Change in Sleep Pattern, Depression, Fearful and Frequent crying. Endocrine Not Present- Cold Intolerance, Excessive Hunger, Hair Changes, Heat Intolerance, Hot flashes and New Diabetes. Hematology Not Present- Blood Thinners, Easy Bruising, Excessive bleeding, Gland problems, HIV and Persistent Infections.  Vitals (Tanisha A. Brown RMA; 05/30/2017 9:31 AM) 05/30/2017 9:30 AM Weight: 195.6 lb Height: 74in Body Surface Area: 2.15 m Body Mass Index: 25.11 kg/m  Temp.: 97.77F  Pulse: 83 (Regular)  BP: 156/84 (Sitting, Left Arm, Standard)      Physical Exam Adin Hector MD; 05/30/2017 9:53 AM)  General Mental Status-Alert. General Appearance-Not in acute distress, Not Sickly. Orientation-Oriented X3. Hydration-Well hydrated. Voice-Normal.  Integumentary Global Assessment Upon inspection and palpation of skin surfaces of the - Axillae: non-tender, no inflammation or ulceration, no drainage. and Distribution of scalp and body hair is normal. General Characteristics Temperature - normal warmth is noted.  Head and Neck Head-normocephalic, atraumatic with no lesions or palpable masses. Face Global Assessment - atraumatic, no absence of expression. Neck Global Assessment - no abnormal movements, no bruit auscultated on the right, no bruit auscultated on the left, no decreased range of motion, non-tender. Trachea-midline. Thyroid Gland Characteristics -  non-tender.  Eye Eyeball - Left-Extraocular movements intact, No Nystagmus. Eyeball - Right-Extraocular movements intact, No Nystagmus. Cornea - Left-No Hazy. Cornea - Right-No Hazy. Sclera/Conjunctiva - Left-No scleral icterus, No Discharge. Sclera/Conjunctiva - Right-No scleral icterus, No Discharge. Pupil - Left-Direct reaction to light normal. Pupil - Right-Direct reaction to light normal.  ENMT Ears Pinna - Left - no drainage observed, no generalized tenderness observed. Right - no drainage observed, no generalized tenderness observed. Nose and Sinuses External Inspection of the Nose - no destructive lesion observed. Inspection of the nares - Left - quiet respiration. Right - quiet respiration. Mouth and Throat Lips - Upper Lip - no fissures observed, no pallor noted. Lower Lip - no fissures observed, no pallor noted. Nasopharynx - no discharge present. Oral Cavity/Oropharynx - Tongue - no dryness observed. Oral Mucosa - no cyanosis observed. Hypopharynx - no evidence of airway distress observed.  Chest and Lung Exam Inspection Movements - Normal and Symmetrical. Accessory muscles - No use of accessory muscles in breathing. Palpation Palpation of the chest reveals - Non-tender. Auscultation Breath sounds - Normal and Clear.  Cardiovascular Auscultation Rhythm - Regular. Murmurs & Other Heart Sounds - Auscultation of the heart reveals - No Murmurs and No Systolic Clicks.  Abdomen Inspection Inspection of the abdomen reveals - No Visible peristalsis and No Abnormal pulsations. Umbilicus - No Bleeding, No Urine drainage. Palpation/Percussion Palpation and Percussion of the abdomen reveal - Soft, Non Tender, No Rebound tenderness, No Rigidity (guarding) and No Cutaneous hyperesthesia. Note: 2 cm umbilical mass reduces down to about a 15 mm umbilical hernia. Diastases. Otherwise the abdomen is soft and flat. Nontender. Not distended. No guarding.  Male  Genitourinary Sexual Maturity Tanner 5 - Adult hair pattern and Adult penile size and shape. Note: Very large right inguinal hernia filling up the scrotum. Highly suspicious for bowel within it. Ultimately reducible. More subtle impulse in left groin. Testes cords and phallus otherwise normal  Peripheral Vascular Upper Extremity Inspection - Left - No Cyanotic nailbeds, Not Ischemic. Right - No Cyanotic nailbeds, Not Ischemic.  Neurologic Neurologic evaluation reveals -normal attention  span and ability to concentrate, able to name objects and repeat phrases. Appropriate fund of knowledge , normal sensation and normal coordination. Mental Status Affect - not angry, not paranoid. Cranial Nerves-Normal Bilaterally. Gait-Normal.  Neuropsychiatric Mental status exam performed with findings of-able to articulate well with normal speech/language, rate, volume and coherence, thought content normal with ability to perform basic computations and apply abstract reasoning and no evidence of hallucinations, delusions, obsessions or homicidal/suicidal ideation.  Musculoskeletal Global Assessment Spine, Ribs and Pelvis - no instability, subluxation or laxity. Right Upper Extremity - no instability, subluxation or laxity. 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  Some chronic skin thickening and changes on bilateral calves. 1 Band-Aid on the left anterior calf. About 1+ edema.  Lymphatic Head & Neck  General Head & Neck Lymphatics: Bilateral - Description - No Localized lymphadenopathy. Axillary  General Axillary Region: Bilateral - Description - No Localized lymphadenopathy. Femoral & Inguinal  Generalized Femoral & Inguinal Lymphatics: Left - Description - No Localized lymphadenopathy. Right - Description - No Localized lymphadenopathy.    Assessment & Plan Adin Hector MD; 05/30/2017 9:56 AM)  Carolynn Serve HERNIA (K40.90) Impression: Very large right  inguinal hernia going down to scrotum. Sensitive reducible. Small impulse on left side.  Standard of care would be repair of hernias with mesh. Laparoscopic underlay repair. Most likely use thicker density mesh with 3 sheets and possibly absorbable tacks given the large hernia size. Have to see how big the defect really is intraoperatively. Usually the other side has a hernia and placing contralateral mesh as well to avoid recurrence. He's had some wound issues but no major ulceration or infection. This resolved manner. I recurrence of be unacceptably high without mesh. Much higher risk of having a postoperative hematoma or seroma usually the scrotal ones will improve over several months.  He is wish to be aggressive and get this fixed. We will work to do an off dialysis day. Usually gets dialysis Tuesday/Thursday/Saturday  Hopefully the umbilical hernia small (just primarily repair.   UMBILICAL HERNIA WITHOUT OBSTRUCTION OR GANGRENE (K42.9)   PREOP - ING HERNIA - ENCOUNTER FOR PREOPERATIVE EXAMINATION FOR GENERAL SURGICAL PROCEDURE (Z01.818)  Current Plans You are being scheduled for surgery- Our schedulers will call you.  You should hear from our office's scheduling department within 5 working days about the location, date, and time of surgery. We try to make accommodations for patient's preferences in scheduling surgery, but sometimes the OR schedule or the surgeon's schedule prevents [place] from making those accommodations.  If you have not heard from our office [phone]) in 5 working days, call the office and ask for your surgeon's nurse.  If you have other questions about your diagnosis, plan, or surgery, call the office and ask for your surgeon's nurse.  Written instructions provided The anatomy & physiology of the abdominal wall and pelvic floor was discussed. The pathophysiology of hernias in the inguinal and pelvic region was discussed. Natural history risks such as  progressive enlargement, pain, incarceration, and strangulation was discussed. Contributors to complications such as smoking, obesity, diabetes, prior surgery, etc were discussed.  I feel the risks of no intervention will lead to serious problems that outweigh the operative risks; therefore, I recommended surgery to reduce and repair the hernia. I explained laparoscopic techniques with possible need for an open approach. I noted usual use of mesh to patch and/or buttress hernia repair  Risks such as bleeding, infection, abscess, need for further treatment, heart attack,  death, and other risks were discussed. I noted a good likelihood this will help address the problem. Goals of post-operative recovery were discussed as well. Possibility that this will not correct all symptoms was explained. I stressed the importance of low-impact activity, aggressive pain control, avoiding constipation, & not pushing through pain to minimize risk of post-operative chronic pain or injury. Possibility of reherniation was discussed. We will work to minimize complications.  An educational handout further explaining the pathology & treatment options was given as well. Questions were answered. The patient expresses understanding & wishes to proceed with surgery.  Pt Education - Pamphlet Given - Laparoscopic Hernia Repair: discussed with patient and provided information. Pt Education - [facility] Pain Control (Darnisha Vernet) Pt Education - [facility] Hernia Post-Op HCI (Janean Eischen): discussed with patient and provided information. Pt Education - [facility] Mesh education: discussed with patient and provided information.  Adin Hector, M.D., F.A.C.S. Gastrointestinal and Minimally Invasive Surgery [facility] [address] [place], [place] [id number] [phone] Main / Paging

## 2017-05-30 NOTE — H&P (Signed)
Jason [name] Documented: 05/30/2017 9:29 AM Location: [facility] Patient #: [id number] DOB: [birth year] Single / Language: Cleophus Molt / Race: Black or African American Male  History of Present Illness Adin Hector MD; 05/30/2017 10:19 AM) The patient is a 57 year old male who presents with an inguinal hernia. Note for "Inguinal hernia": ` ` ` Patient sent for surgical consultation at the request of Donia Pounds MSN, FNP-C  Chief Complaint: Inguinal hernia  The patient is a 57 year old male with end-stage renal disease dialysis dependent. Sounds like he was relying on holistic doctors but has transitioned back to allopathic medicine. Getting hypertension and kidney disease under control. Gets dialysis on Tuesdays Thursdays Saturdays now. Hypertension. Noted right groin bulging about 8 years ago. It has gradually gotten larger. Worse over the past few months. It is out all the time. Getting more difficult to reduce. Mentioned to his primary care provider. Large inguinal hernia noted Surgical consultation requested. He did have some chronic leg ulcers that have been followed by wound ostomy care. Had to wear a wound care boot for several months. However they are healed up and there is one small scab on his left leg. No other infections. Just has 1 small scab remaining. He used to smoke. He quit. He is not on any blood thinners. He claims he moves his bowels about twice a day. No prior abdominal surgery. Claims he can walk 20-30 minutes without much difficulty.   (Review of systems as stated in this history (HPI) or in the review of systems. Otherwise all other 12 point ROS are negative) ` ` `   Past Surgical History Illene Regulus, CMA; 05/30/2017 10:08 AM) Dialysis Shunt / Fistula  Diagnostic Studies History Lars Mage Spillers, CMA; 05/30/2017 10:08 AM) Colonoscopy within last year  Allergies (Tanisha A. Owens Shark, [place]; 05/30/2017 9:33  AM) Lisinopril *ANTIHYPERTENSIVES* Allergies Reconciled  Medication History (Tanisha A. Owens Shark, [place]; 05/30/2017 9:33 AM) Carvedilol (25MG  Tablet, Oral) Active. AmLODIPine Besylate (10MG  Tablet, Oral) Active. Calcium Acetate (Phos Binder) (667MG  Capsule, Oral) Active. Isosorbide Mononitrate ER (60MG  Tablet ER 24HR, Oral) Active. Rena-Vite (Oral) Active. Medications Reconciled  Social History Illene Regulus, CMA; 05/30/2017 10:08 AM) Alcohol use Remotely quit alcohol use. Caffeine use Coffee, Tea. No drug use Tobacco use Former smoker.  Family History Illene Regulus, [place]; 05/30/2017 10:08 AM) Arthritis Mother. Cerebrovascular Accident Mother. Heart Disease Mother. Hypertension Mother.  Other Problems Lars Mage Spillers, CMA; 05/30/2017 10:08 AM) Chronic Renal Failure Syndrome Congestive Heart Failure High blood pressure     Review of Systems (Alisha Spillers CMA; 05/30/2017 10:08 AM) General Not Present- Appetite Loss, Chills, Fatigue, Fever, Night Sweats, Weight Gain and Weight Loss. Skin Present- Non-Healing Wounds. Not Present- Change in Wart/Mole, Dryness, Hives, Jaundice, New Lesions, Rash and Ulcer. HEENT Not Present- Earache, Hearing Loss, Hoarseness, Nose Bleed, Oral Ulcers, Ringing in the Ears, Seasonal Allergies, Sinus Pain, Sore Throat, Visual Disturbances, Wears glasses/contact lenses and Yellow Eyes. Respiratory Not Present- Bloody sputum, Chronic Cough, Difficulty Breathing, Snoring and Wheezing. Breast Not Present- Breast Mass, Breast Pain, Nipple Discharge and Skin Changes. Cardiovascular Not Present- Chest Pain, Difficulty Breathing Lying Down, Leg Cramps, Palpitations, Rapid Heart Rate, Shortness of Breath and Swelling of Extremities. Gastrointestinal Not Present- Abdominal Pain, Bloating, Bloody Stool, Change in Bowel Habits, Chronic diarrhea, Constipation, Difficulty Swallowing, Excessive gas, Gets full quickly at meals, Hemorrhoids,  Indigestion, Nausea, Rectal Pain and Vomiting. Male Genitourinary Not Present- Blood in Urine, Change in Urinary Stream, Frequency, Impotence, Nocturia, Painful Urination, Urgency and Urine  Leakage. Musculoskeletal Not Present- Back Pain, Joint Pain, Joint Stiffness, Muscle Pain, Muscle Weakness and Swelling of Extremities. Neurological Not Present- Decreased Memory, Fainting, Headaches, Numbness, Seizures, Tingling, Tremor, Trouble walking and Weakness. Psychiatric Not Present- Anxiety, Bipolar, Change in Sleep Pattern, Depression, Fearful and Frequent crying. Endocrine Not Present- Cold Intolerance, Excessive Hunger, Hair Changes, Heat Intolerance, Hot flashes and New Diabetes. Hematology Not Present- Blood Thinners, Easy Bruising, Excessive bleeding, Gland problems, HIV and Persistent Infections.  Vitals (Tanisha A. Brown RMA; 05/30/2017 9:31 AM) 05/30/2017 9:30 AM Weight: 195.6 lb Height: 74in Body Surface Area: 2.15 m Body Mass Index: 25.11 kg/m  Temp.: 97.93F  Pulse: 83 (Regular)  BP: 156/84 (Sitting, Left Arm, Standard)      Physical Exam Adin Hector MD; 05/30/2017 9:53 AM)  General Mental Status-Alert. General Appearance-Not in acute distress, Not Sickly. Orientation-Oriented X3. Hydration-Well hydrated. Voice-Normal.  Integumentary Global Assessment Upon inspection and palpation of skin surfaces of the - Axillae: non-tender, no inflammation or ulceration, no drainage. and Distribution of scalp and body hair is normal. General Characteristics Temperature - normal warmth is noted.  Head and Neck Head-normocephalic, atraumatic with no lesions or palpable masses. Face Global Assessment - atraumatic, no absence of expression. Neck Global Assessment - no abnormal movements, no bruit auscultated on the right, no bruit auscultated on the left, no decreased range of motion, non-tender. Trachea-midline. Thyroid Gland Characteristics -  non-tender.  Eye Eyeball - Left-Extraocular movements intact, No Nystagmus. Eyeball - Right-Extraocular movements intact, No Nystagmus. Cornea - Left-No Hazy. Cornea - Right-No Hazy. Sclera/Conjunctiva - Left-No scleral icterus, No Discharge. Sclera/Conjunctiva - Right-No scleral icterus, No Discharge. Pupil - Left-Direct reaction to light normal. Pupil - Right-Direct reaction to light normal.  ENMT Ears Pinna - Left - no drainage observed, no generalized tenderness observed. Right - no drainage observed, no generalized tenderness observed. Nose and Sinuses External Inspection of the Nose - no destructive lesion observed. Inspection of the nares - Left - quiet respiration. Right - quiet respiration. Mouth and Throat Lips - Upper Lip - no fissures observed, no pallor noted. Lower Lip - no fissures observed, no pallor noted. Nasopharynx - no discharge present. Oral Cavity/Oropharynx - Tongue - no dryness observed. Oral Mucosa - no cyanosis observed. Hypopharynx - no evidence of airway distress observed.  Chest and Lung Exam Inspection Movements - Normal and Symmetrical. Accessory muscles - No use of accessory muscles in breathing. Palpation Palpation of the chest reveals - Non-tender. Auscultation Breath sounds - Normal and Clear.  Cardiovascular Auscultation Rhythm - Regular. Murmurs & Other Heart Sounds - Auscultation of the heart reveals - No Murmurs and No Systolic Clicks.  Abdomen Inspection Inspection of the abdomen reveals - No Visible peristalsis and No Abnormal pulsations. Umbilicus - No Bleeding, No Urine drainage. Palpation/Percussion Palpation and Percussion of the abdomen reveal - Soft, Non Tender, No Rebound tenderness, No Rigidity (guarding) and No Cutaneous hyperesthesia. Note: 2 cm umbilical mass reduces down to about a 15 mm umbilical hernia. Diastases. Otherwise the abdomen is soft and flat. Nontender. Not distended. No guarding.  Male  Genitourinary Sexual Maturity Tanner 5 - Adult hair pattern and Adult penile size and shape. Note: Very large right inguinal hernia filling up the scrotum. Highly suspicious for bowel within it. Ultimately reducible. More subtle impulse in left groin. Testes cords and phallus otherwise normal  Peripheral Vascular Upper Extremity Inspection - Left - No Cyanotic nailbeds, Not Ischemic. Right - No Cyanotic nailbeds, Not Ischemic.  Neurologic Neurologic evaluation reveals -normal attention  span and ability to concentrate, able to name objects and repeat phrases. Appropriate fund of knowledge , normal sensation and normal coordination. Mental Status Affect - not angry, not paranoid. Cranial Nerves-Normal Bilaterally. Gait-Normal.  Neuropsychiatric Mental status exam performed with findings of-able to articulate well with normal speech/language, rate, volume and coherence, thought content normal with ability to perform basic computations and apply abstract reasoning and no evidence of hallucinations, delusions, obsessions or homicidal/suicidal ideation.  Musculoskeletal Global Assessment Spine, Ribs and Pelvis - no instability, subluxation or laxity. Right Upper Extremity - no instability, subluxation or laxity. 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  Some chronic skin thickening and changes on bilateral calves. 1 Band-Aid on the left anterior calf. About 1+ edema.  Lymphatic Head & Neck  General Head & Neck Lymphatics: Bilateral - Description - No Localized lymphadenopathy. Axillary  General Axillary Region: Bilateral - Description - No Localized lymphadenopathy. Femoral & Inguinal  Generalized Femoral & Inguinal Lymphatics: Left - Description - No Localized lymphadenopathy. Right - Description - No Localized lymphadenopathy.    Assessment & Plan Adin Hector MD; 05/30/2017 10:22 AM)  Carolynn Serve HERNIA (K40.90) Impression: Very large right  inguinal hernia going down to scrotum. Sensitive reducible. Small impulse on left side.  Standard of care would be repair of hernias with mesh. Laparoscopic underlay repair. Most likely use thicker density mesh with 3 sheets and possibly absorbable tacks given the large hernia size. Have to see how big the defect really is intraoperatively. Usually the other side has a hernia and placing contralateral mesh as well to avoid recurrence. He's had some wound issues but no major ulceration or infection. This resolved manner. I recurrence of be unacceptably high without mesh. Much higher risk of having a postoperative hematoma or seroma usually the scrotal ones will improve over several months.  He is wish to be aggressive and get this fixed. We will work to do an off dialysis day. Usually gets dialysis Tuesday/Thursday/Saturday  Hopefully the umbilical hernia small (just primarily repair.   UMBILICAL HERNIA WITHOUT OBSTRUCTION OR GANGRENE (K42.9)   PREOP - ING HERNIA - ENCOUNTER FOR PREOPERATIVE EXAMINATION FOR GENERAL SURGICAL PROCEDURE (Z01.818)  Current Plans You are being scheduled for surgery- Our schedulers will call you.  You should hear from our office's scheduling department within 5 working days about the location, date, and time of surgery. We try to make accommodations for patient's preferences in scheduling surgery, but sometimes the OR schedule or the surgeon's schedule prevents [place] from making those accommodations.  If you have not heard from our office [phone]) in 5 working days, call the office and ask for your surgeon's nurse.  If you have other questions about your diagnosis, plan, or surgery, call the office and ask for your surgeon's nurse.  Written instructions provided The anatomy & physiology of the abdominal wall and pelvic floor was discussed. The pathophysiology of hernias in the inguinal and pelvic region was discussed. Natural history risks such as  progressive enlargement, pain, incarceration, and strangulation was discussed. Contributors to complications such as smoking, obesity, diabetes, prior surgery, etc were discussed.  I feel the risks of no intervention will lead to serious problems that outweigh the operative risks; therefore, I recommended surgery to reduce and repair the hernia. I explained laparoscopic techniques with possible need for an open approach. I noted usual use of mesh to patch and/or buttress hernia repair  Risks such as bleeding, infection, abscess, need for further treatment, heart attack,  death, and other risks were discussed. I noted a good likelihood this will help address the problem. Goals of post-operative recovery were discussed as well. Possibility that this will not correct all symptoms was explained. I stressed the importance of low-impact activity, aggressive pain control, avoiding constipation, & not pushing through pain to minimize risk of post-operative chronic pain or injury. Possibility of reherniation was discussed. We will work to minimize complications.  An educational handout further explaining the pathology & treatment options was given as well. Questions were answered. The patient expresses understanding & wishes to proceed with surgery.  Pt Education - Pamphlet Given - Laparoscopic Hernia Repair: discussed with patient and provided information. Pt Education - [facility] Pain Control (Mervyn Pflaum) Pt Education - [facility] Hernia Post-Op HCI (Sonita Michiels): discussed with patient and provided information. Pt Education - [facility] Mesh education: discussed with patient and provided information.  ESRD (END STAGE RENAL DISEASE) ON DIALYSIS (N18.6) Impression: Plan to do surgery on an off- dialysis day. (Usually gets dialysis Tuesday/Thursday/Saturday)  Adin Hector, M.D., F.A.C.S. Gastrointestinal and Minimally Invasive Surgery [facility] [address] [place], [place]  [id number] [phone] Main / Paging

## 2017-05-31 DIAGNOSIS — N186 End stage renal disease: Secondary | ICD-10-CM | POA: Diagnosis not present

## 2017-05-31 DIAGNOSIS — Z992 Dependence on renal dialysis: Secondary | ICD-10-CM | POA: Diagnosis not present

## 2017-05-31 DIAGNOSIS — D631 Anemia in chronic kidney disease: Secondary | ICD-10-CM | POA: Diagnosis not present

## 2017-05-31 DIAGNOSIS — D509 Iron deficiency anemia, unspecified: Secondary | ICD-10-CM | POA: Diagnosis not present

## 2017-05-31 DIAGNOSIS — N2581 Secondary hyperparathyroidism of renal origin: Secondary | ICD-10-CM | POA: Diagnosis not present

## 2017-06-02 DIAGNOSIS — N186 End stage renal disease: Secondary | ICD-10-CM | POA: Diagnosis not present

## 2017-06-02 DIAGNOSIS — D631 Anemia in chronic kidney disease: Secondary | ICD-10-CM | POA: Diagnosis not present

## 2017-06-02 DIAGNOSIS — Z992 Dependence on renal dialysis: Secondary | ICD-10-CM | POA: Diagnosis not present

## 2017-06-02 DIAGNOSIS — N2581 Secondary hyperparathyroidism of renal origin: Secondary | ICD-10-CM | POA: Diagnosis not present

## 2017-06-02 DIAGNOSIS — D509 Iron deficiency anemia, unspecified: Secondary | ICD-10-CM | POA: Diagnosis not present

## 2017-06-04 DIAGNOSIS — Z992 Dependence on renal dialysis: Secondary | ICD-10-CM | POA: Diagnosis not present

## 2017-06-04 DIAGNOSIS — D509 Iron deficiency anemia, unspecified: Secondary | ICD-10-CM | POA: Diagnosis not present

## 2017-06-04 DIAGNOSIS — D631 Anemia in chronic kidney disease: Secondary | ICD-10-CM | POA: Diagnosis not present

## 2017-06-04 DIAGNOSIS — N2581 Secondary hyperparathyroidism of renal origin: Secondary | ICD-10-CM | POA: Diagnosis not present

## 2017-06-04 DIAGNOSIS — N186 End stage renal disease: Secondary | ICD-10-CM | POA: Diagnosis not present

## 2017-06-07 DIAGNOSIS — Z992 Dependence on renal dialysis: Secondary | ICD-10-CM | POA: Diagnosis not present

## 2017-06-07 DIAGNOSIS — D631 Anemia in chronic kidney disease: Secondary | ICD-10-CM | POA: Diagnosis not present

## 2017-06-07 DIAGNOSIS — N186 End stage renal disease: Secondary | ICD-10-CM | POA: Diagnosis not present

## 2017-06-07 DIAGNOSIS — D509 Iron deficiency anemia, unspecified: Secondary | ICD-10-CM | POA: Diagnosis not present

## 2017-06-07 DIAGNOSIS — N2581 Secondary hyperparathyroidism of renal origin: Secondary | ICD-10-CM | POA: Diagnosis not present

## 2017-06-09 DIAGNOSIS — N2581 Secondary hyperparathyroidism of renal origin: Secondary | ICD-10-CM | POA: Diagnosis not present

## 2017-06-09 DIAGNOSIS — N186 End stage renal disease: Secondary | ICD-10-CM | POA: Diagnosis not present

## 2017-06-09 DIAGNOSIS — D509 Iron deficiency anemia, unspecified: Secondary | ICD-10-CM | POA: Diagnosis not present

## 2017-06-09 DIAGNOSIS — Z992 Dependence on renal dialysis: Secondary | ICD-10-CM | POA: Diagnosis not present

## 2017-06-09 DIAGNOSIS — D631 Anemia in chronic kidney disease: Secondary | ICD-10-CM | POA: Diagnosis not present

## 2017-06-11 DIAGNOSIS — D509 Iron deficiency anemia, unspecified: Secondary | ICD-10-CM | POA: Diagnosis not present

## 2017-06-11 DIAGNOSIS — D631 Anemia in chronic kidney disease: Secondary | ICD-10-CM | POA: Diagnosis not present

## 2017-06-11 DIAGNOSIS — N186 End stage renal disease: Secondary | ICD-10-CM | POA: Diagnosis not present

## 2017-06-11 DIAGNOSIS — Z992 Dependence on renal dialysis: Secondary | ICD-10-CM | POA: Diagnosis not present

## 2017-06-11 DIAGNOSIS — I129 Hypertensive chronic kidney disease with stage 1 through stage 4 chronic kidney disease, or unspecified chronic kidney disease: Secondary | ICD-10-CM | POA: Diagnosis not present

## 2017-06-11 DIAGNOSIS — N2581 Secondary hyperparathyroidism of renal origin: Secondary | ICD-10-CM | POA: Diagnosis not present

## 2017-06-14 DIAGNOSIS — D509 Iron deficiency anemia, unspecified: Secondary | ICD-10-CM | POA: Diagnosis not present

## 2017-06-14 DIAGNOSIS — D631 Anemia in chronic kidney disease: Secondary | ICD-10-CM | POA: Diagnosis not present

## 2017-06-14 DIAGNOSIS — N2581 Secondary hyperparathyroidism of renal origin: Secondary | ICD-10-CM | POA: Diagnosis not present

## 2017-06-14 DIAGNOSIS — N186 End stage renal disease: Secondary | ICD-10-CM | POA: Diagnosis not present

## 2017-06-14 DIAGNOSIS — Z992 Dependence on renal dialysis: Secondary | ICD-10-CM | POA: Diagnosis not present

## 2017-06-14 MED FILL — cloNIDine HCL 0.1 MG TABS: 0.1 | 30 days supply | Qty: 60 | Fill #2

## 2017-06-16 DIAGNOSIS — N2581 Secondary hyperparathyroidism of renal origin: Secondary | ICD-10-CM | POA: Diagnosis not present

## 2017-06-16 DIAGNOSIS — N186 End stage renal disease: Secondary | ICD-10-CM | POA: Diagnosis not present

## 2017-06-16 DIAGNOSIS — Z992 Dependence on renal dialysis: Secondary | ICD-10-CM | POA: Diagnosis not present

## 2017-06-16 DIAGNOSIS — D631 Anemia in chronic kidney disease: Secondary | ICD-10-CM | POA: Diagnosis not present

## 2017-06-16 DIAGNOSIS — D509 Iron deficiency anemia, unspecified: Secondary | ICD-10-CM | POA: Diagnosis not present

## 2017-06-18 DIAGNOSIS — N2581 Secondary hyperparathyroidism of renal origin: Secondary | ICD-10-CM | POA: Diagnosis not present

## 2017-06-18 DIAGNOSIS — N186 End stage renal disease: Secondary | ICD-10-CM | POA: Diagnosis not present

## 2017-06-18 DIAGNOSIS — D509 Iron deficiency anemia, unspecified: Secondary | ICD-10-CM | POA: Diagnosis not present

## 2017-06-18 DIAGNOSIS — D631 Anemia in chronic kidney disease: Secondary | ICD-10-CM | POA: Diagnosis not present

## 2017-06-18 DIAGNOSIS — Z992 Dependence on renal dialysis: Secondary | ICD-10-CM | POA: Diagnosis not present

## 2017-06-21 DIAGNOSIS — N186 End stage renal disease: Secondary | ICD-10-CM | POA: Diagnosis not present

## 2017-06-21 DIAGNOSIS — D509 Iron deficiency anemia, unspecified: Secondary | ICD-10-CM | POA: Diagnosis not present

## 2017-06-21 DIAGNOSIS — Z992 Dependence on renal dialysis: Secondary | ICD-10-CM | POA: Diagnosis not present

## 2017-06-21 DIAGNOSIS — N2581 Secondary hyperparathyroidism of renal origin: Secondary | ICD-10-CM | POA: Diagnosis not present

## 2017-06-21 DIAGNOSIS — D631 Anemia in chronic kidney disease: Secondary | ICD-10-CM | POA: Diagnosis not present

## 2017-06-23 DIAGNOSIS — D509 Iron deficiency anemia, unspecified: Secondary | ICD-10-CM | POA: Diagnosis not present

## 2017-06-23 DIAGNOSIS — D631 Anemia in chronic kidney disease: Secondary | ICD-10-CM | POA: Diagnosis not present

## 2017-06-23 DIAGNOSIS — N186 End stage renal disease: Secondary | ICD-10-CM | POA: Diagnosis not present

## 2017-06-23 DIAGNOSIS — Z992 Dependence on renal dialysis: Secondary | ICD-10-CM | POA: Diagnosis not present

## 2017-06-23 DIAGNOSIS — N2581 Secondary hyperparathyroidism of renal origin: Secondary | ICD-10-CM | POA: Diagnosis not present

## 2017-06-25 DIAGNOSIS — D631 Anemia in chronic kidney disease: Secondary | ICD-10-CM | POA: Diagnosis not present

## 2017-06-25 DIAGNOSIS — D509 Iron deficiency anemia, unspecified: Secondary | ICD-10-CM | POA: Diagnosis not present

## 2017-06-25 DIAGNOSIS — N186 End stage renal disease: Secondary | ICD-10-CM | POA: Diagnosis not present

## 2017-06-25 DIAGNOSIS — N2581 Secondary hyperparathyroidism of renal origin: Secondary | ICD-10-CM | POA: Diagnosis not present

## 2017-06-25 DIAGNOSIS — Z992 Dependence on renal dialysis: Secondary | ICD-10-CM | POA: Diagnosis not present

## 2017-06-28 DIAGNOSIS — D631 Anemia in chronic kidney disease: Secondary | ICD-10-CM | POA: Diagnosis not present

## 2017-06-28 DIAGNOSIS — N186 End stage renal disease: Secondary | ICD-10-CM | POA: Diagnosis not present

## 2017-06-28 DIAGNOSIS — N2581 Secondary hyperparathyroidism of renal origin: Secondary | ICD-10-CM | POA: Diagnosis not present

## 2017-06-28 DIAGNOSIS — Z992 Dependence on renal dialysis: Secondary | ICD-10-CM | POA: Diagnosis not present

## 2017-06-28 DIAGNOSIS — D509 Iron deficiency anemia, unspecified: Secondary | ICD-10-CM | POA: Diagnosis not present

## 2017-06-30 DIAGNOSIS — N2581 Secondary hyperparathyroidism of renal origin: Secondary | ICD-10-CM | POA: Diagnosis not present

## 2017-06-30 DIAGNOSIS — D509 Iron deficiency anemia, unspecified: Secondary | ICD-10-CM | POA: Diagnosis not present

## 2017-06-30 DIAGNOSIS — N186 End stage renal disease: Secondary | ICD-10-CM | POA: Diagnosis not present

## 2017-06-30 DIAGNOSIS — Z992 Dependence on renal dialysis: Secondary | ICD-10-CM | POA: Diagnosis not present

## 2017-06-30 DIAGNOSIS — D631 Anemia in chronic kidney disease: Secondary | ICD-10-CM | POA: Diagnosis not present

## 2017-07-02 DIAGNOSIS — N2581 Secondary hyperparathyroidism of renal origin: Secondary | ICD-10-CM | POA: Diagnosis not present

## 2017-07-02 DIAGNOSIS — Z992 Dependence on renal dialysis: Secondary | ICD-10-CM | POA: Diagnosis not present

## 2017-07-02 DIAGNOSIS — D509 Iron deficiency anemia, unspecified: Secondary | ICD-10-CM | POA: Diagnosis not present

## 2017-07-02 DIAGNOSIS — N186 End stage renal disease: Secondary | ICD-10-CM | POA: Diagnosis not present

## 2017-07-02 DIAGNOSIS — D631 Anemia in chronic kidney disease: Secondary | ICD-10-CM | POA: Diagnosis not present

## 2017-07-04 MED FILL — ISOSORBIDE MN ER 60 MG TAB: 60 | 30 days supply | Qty: 30 | Fill #3

## 2017-07-05 ENCOUNTER — Encounter (HOSPITAL_COMMUNITY): Payer: Self-pay

## 2017-07-05 DIAGNOSIS — N2581 Secondary hyperparathyroidism of renal origin: Secondary | ICD-10-CM | POA: Diagnosis not present

## 2017-07-05 DIAGNOSIS — D631 Anemia in chronic kidney disease: Secondary | ICD-10-CM | POA: Diagnosis not present

## 2017-07-05 DIAGNOSIS — N186 End stage renal disease: Secondary | ICD-10-CM | POA: Diagnosis not present

## 2017-07-05 DIAGNOSIS — D509 Iron deficiency anemia, unspecified: Secondary | ICD-10-CM | POA: Diagnosis not present

## 2017-07-05 DIAGNOSIS — Z992 Dependence on renal dialysis: Secondary | ICD-10-CM | POA: Diagnosis not present

## 2017-07-05 NOTE — Pre-Procedure Instructions (Signed)
Jason [name].  07/05/2017    Your procedure is scheduled on Friday, July 08, 2017 at 7:30 AM.   Report to [facility] Entrance "A" Admitting Office at 5:30 AM.   Call this number if you have problems the morning of surgery: [phone]   Questions prior to day of surgery, please call [phone] between 8 & 4 PM.   Remember:  Do not eat food after midnight Thursday, 07/07/17.  You may drink clear liquids until 4:30 AM.  Clear liquids allowed are:  Water, Juice (non-citric and without pulp), Carbonated beverages, Clear Tea, Black Coffee only, Plain Jell-O only, Gatorade and Plain Popsicles only   Please finish carbohydrate drink (ENSURE) by 4:30 AM.    Take these medicines the morning of surgery with A SIP OF WATER: Carvedilol (Coreg), Clonidine (Catapres), Isosorbide Mononitrate (Imdur)  Stop Multivitamins as of today. Do not use NSAIDS (Ibuprofen, Aleve, etc) or Aspirin products prior to surgery.    Do not wear jewelry.  Do not wear lotions, powders, cologne or deodorant.  Men may shave face and neck.  Do not bring valuables to the hospital.  [facility] is not responsible for any belongings or valuables.  Contacts, dentures or bridgework may not be worn into surgery.  Leave your suitcase in the car.  After surgery it may be brought to your room.  For patients admitted to the hospital, discharge time will be determined by your treatment team.  Patients discharged the day of surgery will not be allowed to drive home.   [place] [facility] - Preparing for Surgery  Before surgery, you can play an important role.  Because skin is not sterile, your skin needs to be as free of germs as possible.  You can reduce the number of germs on you skin by washing with CHG (chlorahexidine gluconate) soap before surgery.  CHG is an antiseptic cleaner which kills germs and bonds with the skin to continue killing germs even after washing.  Oral Hygiene is also important in reducing the risk of  infection.  Remember to brush your teeth with your regular toothpaste the morning of surgery.  Please DO NOT use if you have an allergy to CHG or antibacterial soaps.  If your skin becomes reddened/irritated stop using the CHG and inform your nurse when you arrive at Short Stay.  Do not shave (including legs and underarms) for at least 48 hours prior to the first CHG shower.  You may shave your face.  Please follow these instructions carefully:   1.  Shower with CHG Soap the night before surgery and the morning of Surgery.  2.  If you choose to wash your hair, wash your hair first as usual with your normal shampoo.  3.  After you shampoo, rinse your hair and body thoroughly to remove the shampoo. 4.  Use CHG as you would any other liquid soap.  You can apply chg directly to the skin and wash gently with a      scrungie or washcloth.           5.  Apply the CHG Soap to your body ONLY FROM THE NECK DOWN.   Do not use on open wounds or open sores. Avoid contact with your eyes, ears, mouth and genitals (private parts).  Wash genitals (private parts) with your normal soap.  6.  Wash thoroughly, paying special attention to the area where your surgery will be performed.  7.  Thoroughly rinse your body with warm  water from the neck down.  8.  DO NOT shower/wash with your normal soap after using and rinsing off the CHG Soap.  9.  Pat yourself dry with a clean towel.            10.  Wear clean pajamas.            11.  Place clean sheets on your bed the night of your first shower and do not sleep with pets.  Day of Surgery  Shower as above. Do not apply any lotions/deodrants the morning of surgery.   Please wear clean clothes to the hospital/surgery center. Remember to brush your teeth with toothpaste.   Please read over the fact sheets that you were given.

## 2017-07-06 ENCOUNTER — Encounter (HOSPITAL_COMMUNITY): Payer: Self-pay

## 2017-07-06 ENCOUNTER — Other Ambulatory Visit: Payer: Self-pay

## 2017-07-06 ENCOUNTER — Encounter (HOSPITAL_COMMUNITY)
Admission: RE | Admit: 2017-07-06 | Discharge: 2017-07-06 | Disposition: A | Discharge status: Home / Self Care | Payer: Medicare Other | Source: Ambulatory Visit | Attending: Surgery | Admitting: Surgery

## 2017-07-06 DIAGNOSIS — Z87891 Personal history of nicotine dependence: Secondary | ICD-10-CM | POA: Insufficient documentation

## 2017-07-06 DIAGNOSIS — I132 Hypertensive heart and chronic kidney disease with heart failure and with stage 5 chronic kidney disease, or end stage renal disease: Secondary | ICD-10-CM | POA: Insufficient documentation

## 2017-07-06 DIAGNOSIS — Z79899 Other long term (current) drug therapy: Secondary | ICD-10-CM | POA: Insufficient documentation

## 2017-07-06 DIAGNOSIS — N186 End stage renal disease: Secondary | ICD-10-CM | POA: Diagnosis not present

## 2017-07-06 DIAGNOSIS — Z01818 Encounter for other preprocedural examination: Secondary | ICD-10-CM | POA: Diagnosis not present

## 2017-07-06 DIAGNOSIS — K4020 Bilateral inguinal hernia, without obstruction or gangrene, not specified as recurrent: Secondary | ICD-10-CM | POA: Insufficient documentation

## 2017-07-06 DIAGNOSIS — Z01812 Encounter for preprocedural laboratory examination: Secondary | ICD-10-CM | POA: Diagnosis not present

## 2017-07-06 DIAGNOSIS — I509 Heart failure, unspecified: Secondary | ICD-10-CM | POA: Diagnosis not present

## 2017-07-06 DIAGNOSIS — K429 Umbilical hernia without obstruction or gangrene: Secondary | ICD-10-CM | POA: Insufficient documentation

## 2017-07-06 DIAGNOSIS — Z992 Dependence on renal dialysis: Secondary | ICD-10-CM | POA: Diagnosis not present

## 2017-07-06 HISTORY — DX: Abnormal results of liver function studies: R94.5

## 2017-07-06 HISTORY — DX: Other specified abnormal findings of blood chemistry: R79.89

## 2017-07-06 HISTORY — DX: End stage renal disease: N18.6

## 2017-07-06 HISTORY — DX: Anemia, unspecified: D64.9

## 2017-07-06 HISTORY — DX: Pneumonia, unspecified organism: J18.9

## 2017-07-06 LAB — BASIC METABOLIC PANEL
Anion gap: 8 (ref 5–15)
BUN: 38 mg/dL — AB (ref 6–20)
CHLORIDE: 104 mmol/L (ref 98–111)
CO2: 30 mmol/L (ref 22–32)
Calcium: 8.6 mg/dL — ABNORMAL LOW (ref 8.9–10.3)
Creatinine, Ser: 5.7 mg/dL — ABNORMAL HIGH (ref 0.61–1.24)
GFR calc Af Amer: 12 mL/min — ABNORMAL LOW (ref >60)
GFR calc non Af Amer: 10 mL/min — ABNORMAL LOW (ref >60)
GLUCOSE: 90 mg/dL (ref 70–99)
Potassium: 3.8 mmol/L (ref 3.5–5.1)
Sodium: 142 mmol/L (ref 135–145)

## 2017-07-06 LAB — CBC
HCT: 39.2 % (ref 39.0–52.0)
Hemoglobin: 12.3 g/dL — ABNORMAL LOW (ref 13.0–17.0)
MCH: 28.3 pg (ref 26.0–34.0)
MCHC: 31.4 g/dL (ref 30.0–36.0)
MCV: 90.3 fL (ref 78.0–100.0)
Platelets: 190 10*3/uL (ref 150–400)
RBC: 4.34 MIL/uL (ref 4.22–5.81)
RDW: 13.8 % (ref 11.5–15.5)
WBC: 5.7 10*3/uL (ref 4.0–10.5)

## 2017-07-06 NOTE — Progress Notes (Signed)
Sunday Spillers from Dr. Gladstone Pih office called back and stated that Dr. Elie Confer said the pt's insurance would not cover if he stayed overnight. She states if he needs to reschedule until he can find someone to drive him home and be with him at home the 24 hours after surgery he can call the office and they can change the surgery date. I called pt back and gave him this information. He states he thinks he may have worked it out and found someone. I told him if he doesn't, he needs to call Dr. Gladstone Pih office. He voiced understanding.

## 2017-07-06 NOTE — Progress Notes (Signed)
Pt has hx of CHF (new onset Jan. 2019), denies any other cardiac history. Pt was hospitalized in Jan. 2019 for fluid overload, was started on dialysis at the time. Pt was instructed to follow up with his PCP, Dr. Scot Dock (Vascular surgeon) and Dr. [name] (cardiologist). Pt states he has seen his PCP and Dr. Scot Dock. He didn't remember anything about seeing a cardiologist. Pt denies any recent chest pain or sob. Pt states he is not diabetic. Pt has dialysis on T/Th/S at [facility]. Pt states he really does not have anyone that can drive him home and be with him the 24 hours after surgery and he states he thinks he would feel better if he could stay over night. I told him I would call Dr. Gladstone Pih office and put that request in for him. I spoke with Sunday Spillers at Dr. Gladstone Pih office and she states she will ask Dr. Elie Confer, but is not sure that he can make that happen.

## 2017-07-07 ENCOUNTER — Telehealth: Payer: Self-pay | Admitting: Physician Assistant

## 2017-07-07 ENCOUNTER — Encounter (HOSPITAL_COMMUNITY): Payer: Self-pay | Admitting: Physician Assistant

## 2017-07-07 DIAGNOSIS — N186 End stage renal disease: Secondary | ICD-10-CM | POA: Diagnosis not present

## 2017-07-07 DIAGNOSIS — D509 Iron deficiency anemia, unspecified: Secondary | ICD-10-CM | POA: Diagnosis not present

## 2017-07-07 DIAGNOSIS — D631 Anemia in chronic kidney disease: Secondary | ICD-10-CM | POA: Diagnosis not present

## 2017-07-07 DIAGNOSIS — N2581 Secondary hyperparathyroidism of renal origin: Secondary | ICD-10-CM | POA: Diagnosis not present

## 2017-07-07 DIAGNOSIS — Z992 Dependence on renal dialysis: Secondary | ICD-10-CM | POA: Diagnosis not present

## 2017-07-07 NOTE — Telephone Encounter (Signed)
New Message       [facility] Pre-operative Risk Assessment    Request for surgical clearance:  1. What type of surgery is being performed? Bilateral Hernia Repair  2. When is this surgery scheduled? TBD   3. What type of clearance is required (medical clearance vs. Pharmacy clearance to hold med vs. Both)? Both   4. Are there any medications that need to be held prior to surgery and how long? Cardiology sugggestion   5. Practice name and name of physician performing surgery? [facility] Dr. Michael Boston    6. What is your office phone number [phone]    7.   What is your office fax number [phone]  8.   Anesthesia type (None, local, MAC, general) ? General     Jason [name] 07/07/2017, 12:32 PM  _________________________________________________________________   (provider comments below)

## 2017-07-07 NOTE — Telephone Encounter (Signed)
   Primary Cardiologist:Jason [name], MD  Chart reviewed as part of pre-operative protocol coverage. Because of Jason MOHAMMAD Jr.'s past medical history and time since last visit, he/she will require a follow-up visit in order to better assess preoperative cardiovascular risk. He has history of HTN, CKD, anemia, CHF, noncompliance. During 01/2017 admission was admitted with volume overload and HTN urgency. Found to have elevated troponin, low EF, non sustained V-tach, progression to dialysis. He refused cath. Dr. Johnsie Cancel raised concern for noncompliance with recommendations and follow-up. He has not followed up with cardiology as scheduled. I also see numerous appointments to ID cancelled as well.  Pre-op covering staff: - Please schedule appointment and call patient to inform them.  I will forward this message via Epic fax function. Please call with questions.   Charlie Pitter, PA-C  07/07/2017, 4:39 PM

## 2017-07-07 NOTE — Progress Notes (Signed)
Anesthesia Chart Review:  Case:  [id number] Date/Time:  07/08/17 0715   Procedures:      LAPAROSCOPIC BILATERAL INGUINAL HERNIA REPAIR (Bilateral )     PRIMARY UMBILICAL HERNIA REPAIR (N/A )     INSERTION OF MESH (N/A )   Anesthesia type:  General   Pre-op diagnosis:  Bilateral inguinal (right giant scrotal) hernias. Umbilical hernia   Location:  [facility] OR ROOM 02 / [place] [facility]   Surgeon:  Michael Boston, MD      DISCUSSION:Jarrod Sandate is a 57 year old male former smoker with a history of end-stage renal disease (HD on T/TH/S), hypertension (poor compliance, congestive heart failure EF 35-40% per last ECHO, may be lower per Dr. [name]).  Pt admitted 01/16/2017-01/25/2017 for acute hypoxic respiratory failure secondary to acute on chronic HFREF, flash pulmonary edema, hypervolemia due to new ESRD. He was started on dialysis and status improved. ECHO performed during admission showed EF 35-40%, however per Dr. [name] may be lower 15-20%. Peak troponin 0.43 but thought to be due to demand ischemia in setting of ESRD. He had several runs of NSVT 20-30beats, cath recommended during admission per Dr. Jenkins Rouge to rule out ischemia but patient refused. He was also to f/u 20mo outpatient for repeat ECHO and plan at that time was to consider AICD if EF did not improve, however patient did not f/u. He has not seen cardiology since admission.  Reviewed case with Anesthesia Dr. Brayton Caves and this patient will need cardiology f/u and clearance prior to undergoing surgery. [facility] triage nurse notified and she stated she will notify the patient and Dr. Johney Maine.     VS: BP (!) 170/98 Comment: notified Ship broker  Pulse 65   Temp 36.8 C   Resp 18   Ht 6\' 2"  (1.88 m)   Wt 185 lb 6.4 oz (84.1 kg)   SpO2 100%   BMI 23.80 kg/m    At PAT appointment pt stated he had run out of clonidine 07/05/2017 and planned to pick up a refill 07/06/2017.   PROVIDERS: Scot Jun, FNP and Dorena Dew, FNP provide  primary care - last seen 05/06/2017  Kirk Ruths, MD Cardiologist - pt has only seen during admission 01/16/2017, per noted was supposed to f/u for repeat ECHO 31months post discharge but did not  Blima Dessert, MD vascular surgeon last seen 20/20/2019   LABS: Needs I-STAT 4 on DOS  (all labs ordered are listed, but only abnormal results are displayed)  Labs Reviewed  BASIC METABOLIC PANEL - Abnormal; Notable for the following components:      Result Value   BUN 38 (*)    Creatinine, Ser 5.70 (*)    Calcium 8.6 (*)    GFR calc non Af Amer 10 (*)    GFR calc Af Amer 12 (*)    All other components within normal limits  CBC - Abnormal; Notable for the following components:   Hemoglobin 12.3 (*)    All other components within normal limits     IMAGES:  PORTABLE CHEST 1 VIEW  COMPARISON:  01/16/2017  FINDINGS: Cardiac shadow remains enlarged. New dialysis catheter is noted in satisfactory position. The overall inspiratory effort is poor although no pneumothorax is seen. Mild vascular congestion is again noted. Left basilar atelectatic changes are seen.  IMPRESSION: No pneumothorax following dialysis catheter placement.  Mild left basilar atelectasis.   EKG:   Dated 01/25/2017 NSR with T wave abnormality, consider anterior ischemia   CV:  ECHO dated 01/17/2017 Study Conclusions  - Left ventricle: The cavity size was normal. There was moderate   concentric hypertrophy. Systolic function was moderately reduced.   The estimated ejection fraction was in the range of 35% to 40%.   Moderate diffuse hypokinesis. Features are consistent with a   pseudonormal left ventricular filling pattern, with concomitant   abnormal relaxation and increased filling pressure (grade 2   diastolic dysfunction). Doppler parameters are consistent with   high ventricular filling pressure. - Aortic valve: Trileaflet; mildly thickened, mildly calcified   leaflets. - Mitral  valve: There was mild regurgitation. - Right ventricle: The cavity size was severely dilated. Wall   thickness was normal. - Right atrium: The atrium was severely dilated. - Tricuspid valve: There was mild-moderate regurgitation. - Pulmonary arteries: PA peak pressure: 64 mm Hg (S). - Pericardium, extracardiac: A trivial, free-flowing pericardial   effusion was identified circumferential to the heart. The fluid   had no internal echoes.  Impressions:  - The right ventricular systolic pressure was increased consistent   with moderate pulmonary hypertension.  Carotid U/S dated 04/04/2016 with no impression/read - ED note dates same day indicates U/S reviewed with vascular and no abnormality was seen   Past Medical History:  Diagnosis Date  . AKI (acute kidney injury) ([place]) 01/2015  . Anemia   . CHF (congestive heart failure) ([place])   . Elevated LFTs    per patient  . End stage renal disease [facility])    Dialysis T/Th/Sa  Started 02/2017  . Hypertension   . Pneumonia     Past Surgical History:  Procedure Laterality Date  . AV FISTULA PLACEMENT Right 01/20/2017   Procedure: ARTERIOVENOUS (AV) FISTULA CREATION;  Surgeon: Angelia Mould, MD;  Location: [place];  Service: Vascular;  Laterality: Right;  . INSERTION OF DIALYSIS CATHETER Right 01/20/2017   Procedure: INSERTION OF DIALYSIS CATHETER;  Surgeon: Angelia Mould, MD;  Location: [place];  Service: Vascular;  Laterality: Right;  . IR FLUORO GUIDE CV LINE RIGHT  01/18/2017  . IR US GUIDE VASC ACCESS RIGHT  01/18/2017  . TOE SURGERY    . TONSILLECTOMY      MEDICATIONS: . albuterol (PROVENTIL HFA;VENTOLIN HFA) 108 (90 Base) MCG/ACT inhaler  . amLODipine (NORVASC) 10 MG tablet  . calcium acetate (PHOSLO) 667 MG capsule  . carvedilol (COREG) 25 MG tablet  . cloNIDine (CATAPRES) 0.1 MG tablet  . isosorbide mononitrate (IMDUR) 60 MG 24 hr tablet  . lidocaine-prilocaine (EMLA) cream  . Multiple Vitamin (MULTIVITAMIN WITH  MINERALS) TABS tablet  . Multiple Vitamins-Minerals (ESSENTIAL BALANCE PO)  . Nutritional Supplements (FEEDING SUPPLEMENT, NEPRO CARB STEADY,) LIQD   No current facility-administered medications for this encounter.    Wynonia Musty [facility]/Anesthesiology Phone [phone] 07/07/2017 9:53 AM

## 2017-07-08 ENCOUNTER — Encounter (HOSPITAL_COMMUNITY): Admission: RE | Payer: Self-pay | Source: Ambulatory Visit

## 2017-07-08 ENCOUNTER — Ambulatory Visit (HOSPITAL_COMMUNITY): Admission: RE | Admit: 2017-07-08 | Payer: Medicare Other | Source: Ambulatory Visit | Admitting: Surgery

## 2017-07-08 SURGERY — REPAIR, HERNIA, INGUINAL, BILATERAL, LAPAROSCOPIC
Anesthesia: General

## 2017-07-08 NOTE — Telephone Encounter (Signed)
Pt called in scheduled appt on 7/24 with R. Barrett.

## 2017-07-09 DIAGNOSIS — Z992 Dependence on renal dialysis: Secondary | ICD-10-CM | POA: Diagnosis not present

## 2017-07-09 DIAGNOSIS — N2581 Secondary hyperparathyroidism of renal origin: Secondary | ICD-10-CM | POA: Diagnosis not present

## 2017-07-09 DIAGNOSIS — D631 Anemia in chronic kidney disease: Secondary | ICD-10-CM | POA: Diagnosis not present

## 2017-07-09 DIAGNOSIS — D509 Iron deficiency anemia, unspecified: Secondary | ICD-10-CM | POA: Diagnosis not present

## 2017-07-09 DIAGNOSIS — N186 End stage renal disease: Secondary | ICD-10-CM | POA: Diagnosis not present

## 2017-07-11 DIAGNOSIS — N186 End stage renal disease: Secondary | ICD-10-CM | POA: Diagnosis not present

## 2017-07-11 DIAGNOSIS — I129 Hypertensive chronic kidney disease with stage 1 through stage 4 chronic kidney disease, or unspecified chronic kidney disease: Secondary | ICD-10-CM | POA: Diagnosis not present

## 2017-07-11 DIAGNOSIS — Z992 Dependence on renal dialysis: Secondary | ICD-10-CM | POA: Diagnosis not present

## 2017-07-11 MED FILL — cloNIDine HCL 0.1 MG TABS: 0.1 | 30 days supply | Qty: 60 | Fill #3

## 2017-07-12 DIAGNOSIS — Z992 Dependence on renal dialysis: Secondary | ICD-10-CM | POA: Diagnosis not present

## 2017-07-12 DIAGNOSIS — N2581 Secondary hyperparathyroidism of renal origin: Secondary | ICD-10-CM | POA: Diagnosis not present

## 2017-07-12 DIAGNOSIS — N186 End stage renal disease: Secondary | ICD-10-CM | POA: Diagnosis not present

## 2017-07-12 DIAGNOSIS — D509 Iron deficiency anemia, unspecified: Secondary | ICD-10-CM | POA: Diagnosis not present

## 2017-07-14 DIAGNOSIS — N2581 Secondary hyperparathyroidism of renal origin: Secondary | ICD-10-CM | POA: Diagnosis not present

## 2017-07-14 DIAGNOSIS — Z992 Dependence on renal dialysis: Secondary | ICD-10-CM | POA: Diagnosis not present

## 2017-07-14 DIAGNOSIS — N186 End stage renal disease: Secondary | ICD-10-CM | POA: Diagnosis not present

## 2017-07-14 DIAGNOSIS — D509 Iron deficiency anemia, unspecified: Secondary | ICD-10-CM | POA: Diagnosis not present

## 2017-07-16 DIAGNOSIS — D509 Iron deficiency anemia, unspecified: Secondary | ICD-10-CM | POA: Diagnosis not present

## 2017-07-16 DIAGNOSIS — N186 End stage renal disease: Secondary | ICD-10-CM | POA: Diagnosis not present

## 2017-07-16 DIAGNOSIS — N2581 Secondary hyperparathyroidism of renal origin: Secondary | ICD-10-CM | POA: Diagnosis not present

## 2017-07-16 DIAGNOSIS — Z992 Dependence on renal dialysis: Secondary | ICD-10-CM | POA: Diagnosis not present

## 2017-07-19 DIAGNOSIS — D509 Iron deficiency anemia, unspecified: Secondary | ICD-10-CM | POA: Diagnosis not present

## 2017-07-19 DIAGNOSIS — N186 End stage renal disease: Secondary | ICD-10-CM | POA: Diagnosis not present

## 2017-07-19 DIAGNOSIS — N2581 Secondary hyperparathyroidism of renal origin: Secondary | ICD-10-CM | POA: Diagnosis not present

## 2017-07-19 DIAGNOSIS — Z992 Dependence on renal dialysis: Secondary | ICD-10-CM | POA: Diagnosis not present

## 2017-07-19 IMAGING — US US RENAL
1 series · 14 of 25 positions shown · non-contrast
Comparison: [DATE]

CLINICAL DATA: Acute renal failure.  History of hypertension.

EXAM:
RENAL / URINARY TRACT ULTRASOUND COMPLETE

[Series 1: us renal · 0.30mm/px · 14 of 41 slices shown]
[im 1/41]
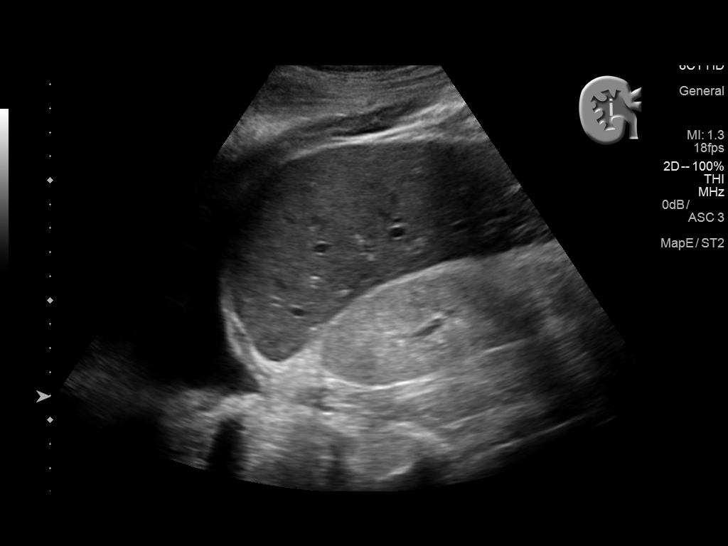
[im 4/41]
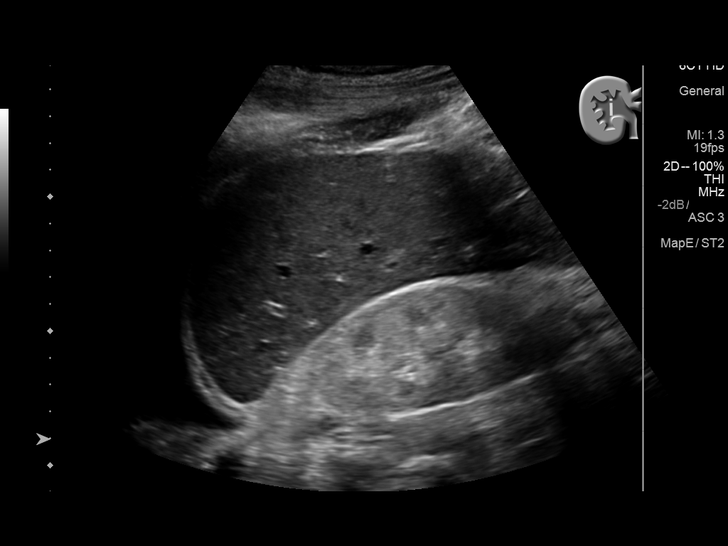
[im 7/41]
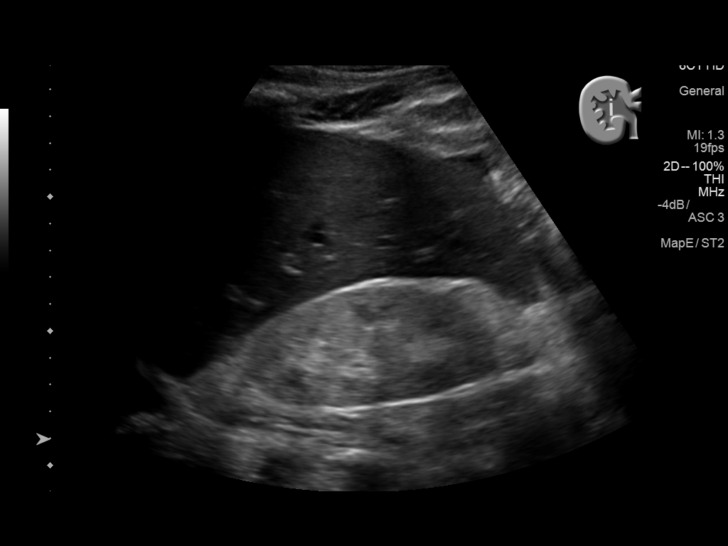
[im 11/41]
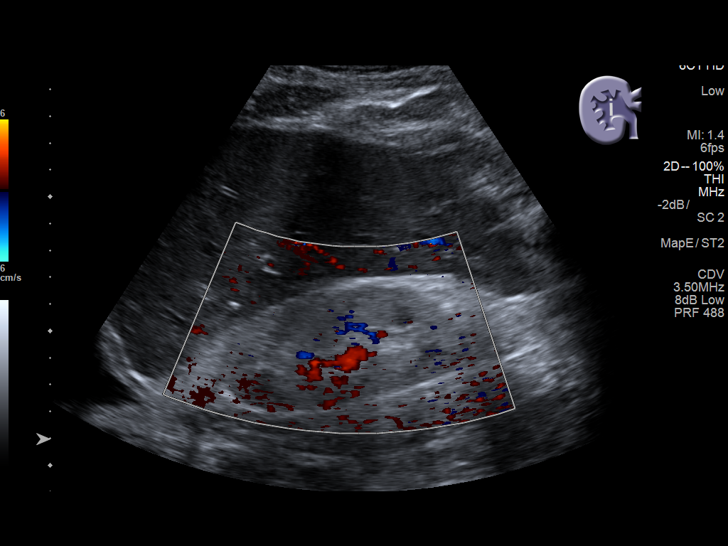
[im 14/41]
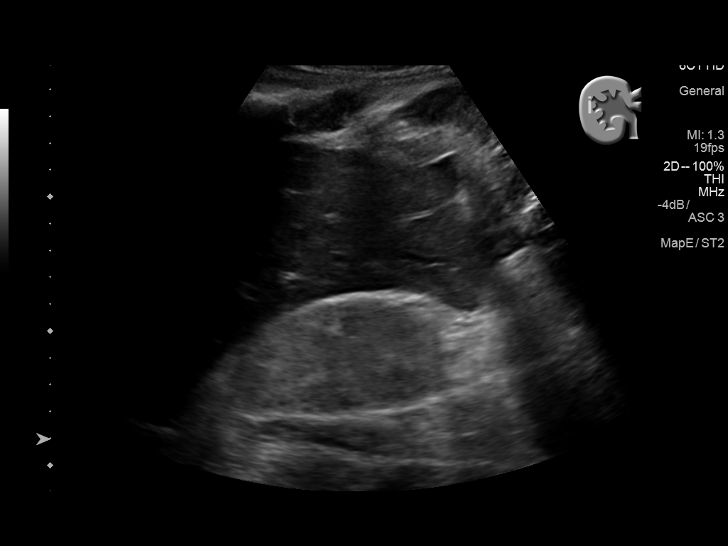
[im 16/41]
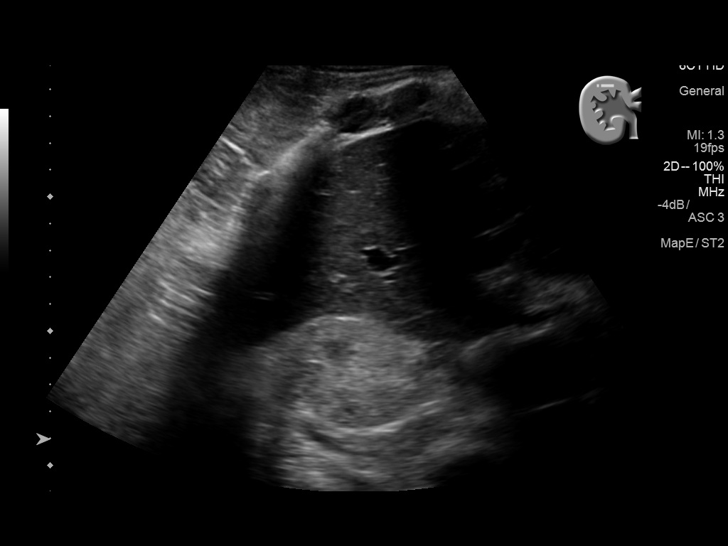
[im 19/41]
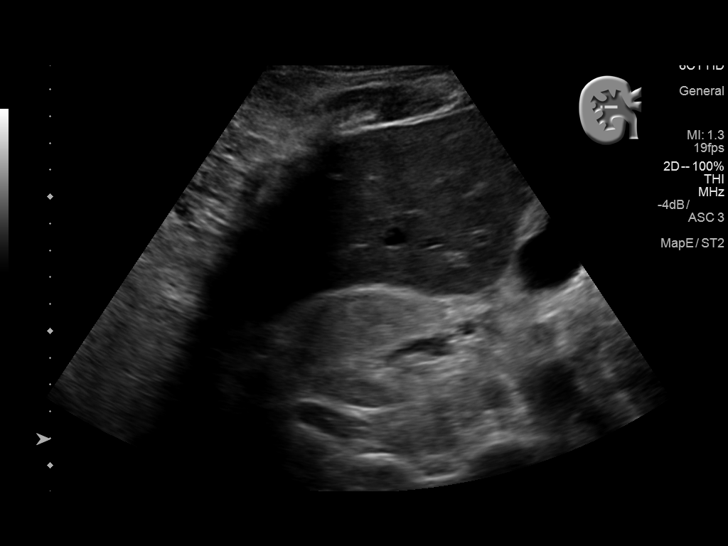
[im 22/41]
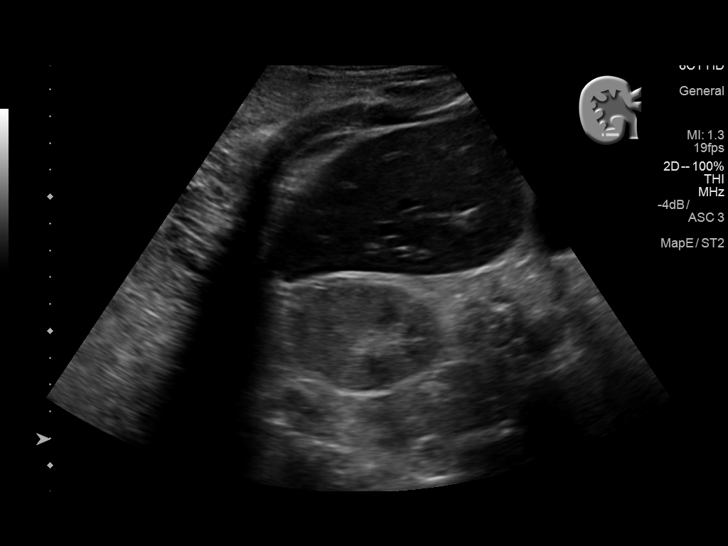
[im 26/41]
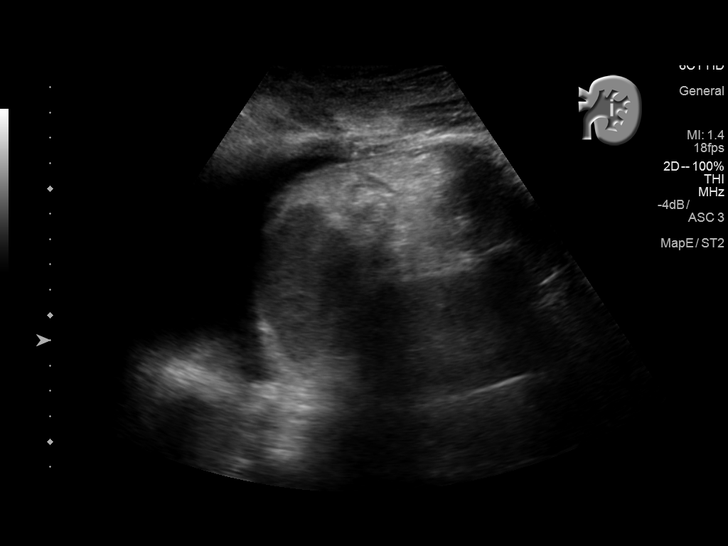
[im 27/41]
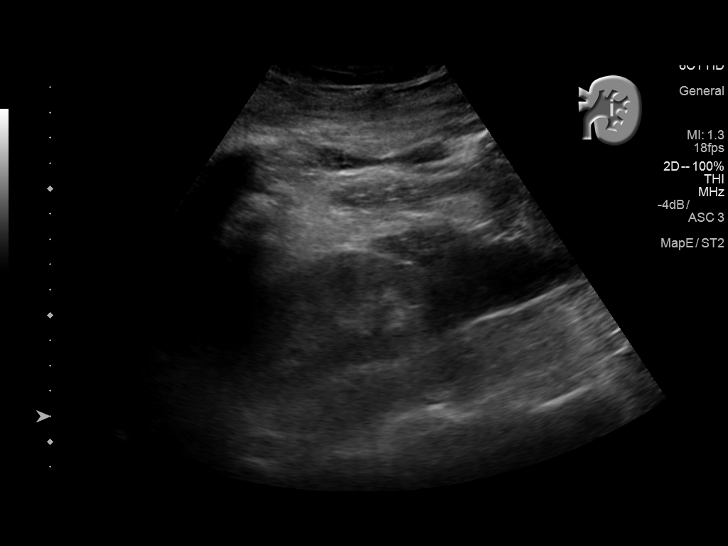
[im 31/41]
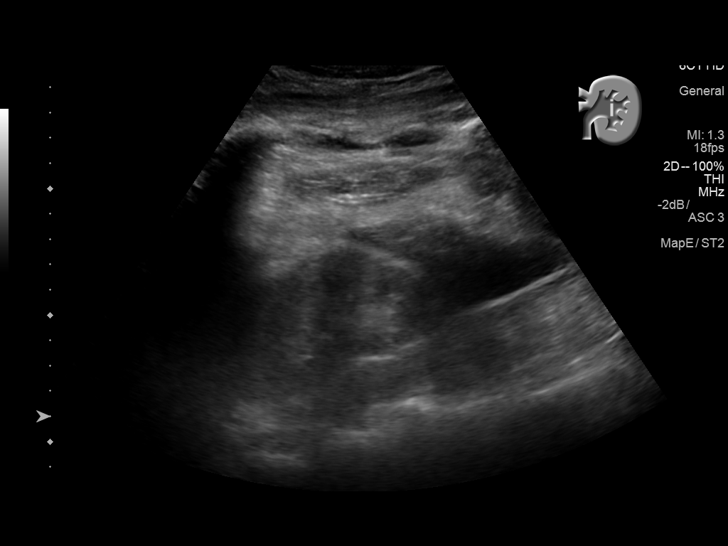
[im 34/41]
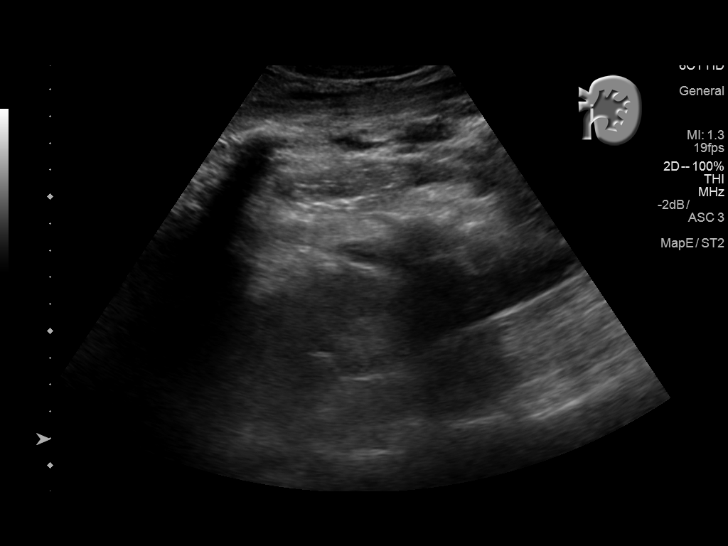
[im 37/41]
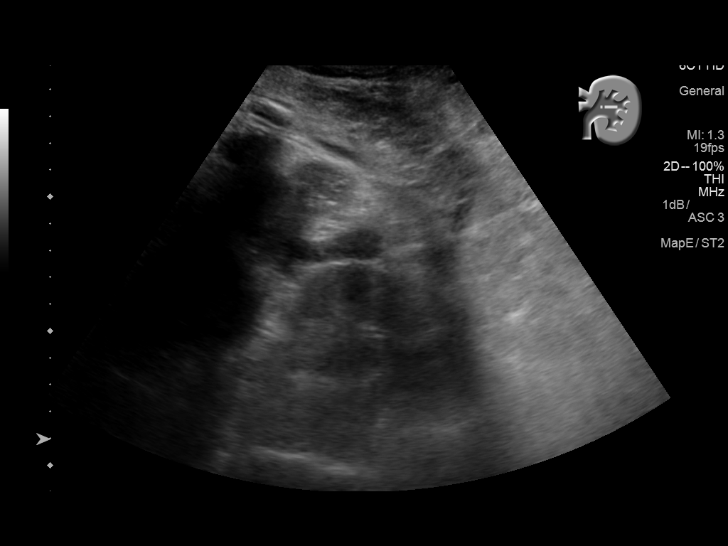
[im 41/41]
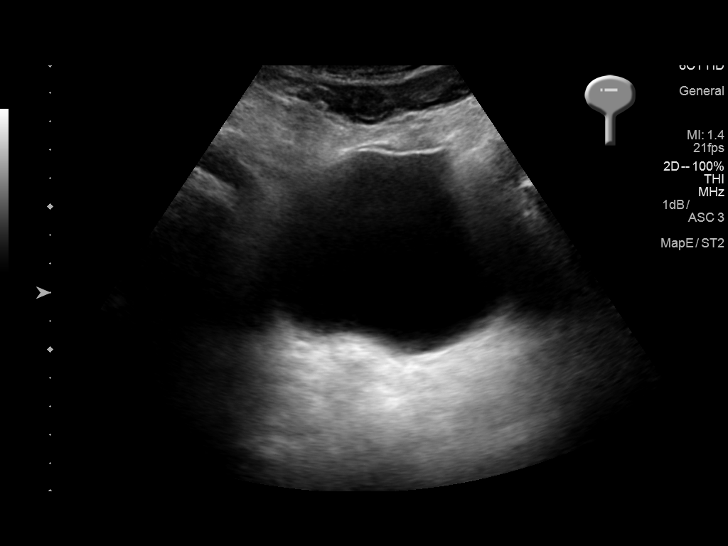

[14 of 25 positions shown; findings below may reference images not displayed]

FINDINGS: Right Kidney:

Length: 9.9 cm. Echogenic renal parenchyma. No focal mass or
hydronephrosis.

Left Kidney:

Length: 9.2 cm. Echogenic renal parenchyma. No focal mass or
hydronephrosis.

Bladder:

Small amount of debris is noted within the bladder.

Additional:

Bilateral pleural effusions are noted.
IMPRESSION: 1. Echogenic renal parenchyma bilaterally, compatible with the
history of renal failure. No hydronephrosis.
2. Bilateral pleural effusions.

## 2017-07-21 DIAGNOSIS — N2581 Secondary hyperparathyroidism of renal origin: Secondary | ICD-10-CM | POA: Diagnosis not present

## 2017-07-21 DIAGNOSIS — Z992 Dependence on renal dialysis: Secondary | ICD-10-CM | POA: Diagnosis not present

## 2017-07-21 DIAGNOSIS — N186 End stage renal disease: Secondary | ICD-10-CM | POA: Diagnosis not present

## 2017-07-21 DIAGNOSIS — D509 Iron deficiency anemia, unspecified: Secondary | ICD-10-CM | POA: Diagnosis not present

## 2017-07-23 DIAGNOSIS — D509 Iron deficiency anemia, unspecified: Secondary | ICD-10-CM | POA: Diagnosis not present

## 2017-07-23 DIAGNOSIS — N186 End stage renal disease: Secondary | ICD-10-CM | POA: Diagnosis not present

## 2017-07-23 DIAGNOSIS — Z992 Dependence on renal dialysis: Secondary | ICD-10-CM | POA: Diagnosis not present

## 2017-07-23 DIAGNOSIS — N2581 Secondary hyperparathyroidism of renal origin: Secondary | ICD-10-CM | POA: Diagnosis not present

## 2017-07-26 DIAGNOSIS — N2581 Secondary hyperparathyroidism of renal origin: Secondary | ICD-10-CM | POA: Diagnosis not present

## 2017-07-26 DIAGNOSIS — N186 End stage renal disease: Secondary | ICD-10-CM | POA: Diagnosis not present

## 2017-07-26 DIAGNOSIS — Z992 Dependence on renal dialysis: Secondary | ICD-10-CM | POA: Diagnosis not present

## 2017-07-26 DIAGNOSIS — D509 Iron deficiency anemia, unspecified: Secondary | ICD-10-CM | POA: Diagnosis not present

## 2017-07-27 ENCOUNTER — Other Ambulatory Visit: Payer: Self-pay

## 2017-07-27 NOTE — Telephone Encounter (Signed)
Patient has a appointment schedule to meet provider but was last in office in April.

## 2017-07-28 DIAGNOSIS — N2581 Secondary hyperparathyroidism of renal origin: Secondary | ICD-10-CM | POA: Diagnosis not present

## 2017-07-28 DIAGNOSIS — Z992 Dependence on renal dialysis: Secondary | ICD-10-CM | POA: Diagnosis not present

## 2017-07-28 DIAGNOSIS — D509 Iron deficiency anemia, unspecified: Secondary | ICD-10-CM | POA: Diagnosis not present

## 2017-07-28 DIAGNOSIS — N186 End stage renal disease: Secondary | ICD-10-CM | POA: Diagnosis not present

## 2017-07-28 NOTE — Telephone Encounter (Signed)
Jason [name],  Patient has cancelled last 2 scheduled appointments. I have never seen patient. Please inform him that he needs to make follow up appointment within the next 2 weeks. After he schedules appointment, we will give him enough meds up to that appointment. Let me know and I will send 'days supply' to pharmacy.   Thanks!

## 2017-07-30 DIAGNOSIS — D509 Iron deficiency anemia, unspecified: Secondary | ICD-10-CM | POA: Diagnosis not present

## 2017-07-30 DIAGNOSIS — N186 End stage renal disease: Secondary | ICD-10-CM | POA: Diagnosis not present

## 2017-07-30 DIAGNOSIS — Z992 Dependence on renal dialysis: Secondary | ICD-10-CM | POA: Diagnosis not present

## 2017-07-30 DIAGNOSIS — N2581 Secondary hyperparathyroidism of renal origin: Secondary | ICD-10-CM | POA: Diagnosis not present

## 2017-08-02 DIAGNOSIS — N186 End stage renal disease: Secondary | ICD-10-CM | POA: Diagnosis not present

## 2017-08-02 DIAGNOSIS — D509 Iron deficiency anemia, unspecified: Secondary | ICD-10-CM | POA: Diagnosis not present

## 2017-08-02 DIAGNOSIS — Z992 Dependence on renal dialysis: Secondary | ICD-10-CM | POA: Diagnosis not present

## 2017-08-02 DIAGNOSIS — N2581 Secondary hyperparathyroidism of renal origin: Secondary | ICD-10-CM | POA: Diagnosis not present

## 2017-08-03 ENCOUNTER — Ambulatory Visit (INDEPENDENT_AMBULATORY_CARE_PROVIDER_SITE_OTHER): Payer: Medicare Other | Admitting: Physician Assistant

## 2017-08-03 ENCOUNTER — Encounter: Payer: Self-pay | Admitting: Physician Assistant

## 2017-08-03 VITALS — BP 144/98 | HR 77 | Ht 74.0 in | Wt 184.0 lb

## 2017-08-03 DIAGNOSIS — I5042 Chronic combined systolic (congestive) and diastolic (congestive) heart failure: Secondary | ICD-10-CM

## 2017-08-03 DIAGNOSIS — I10 Essential (primary) hypertension: Secondary | ICD-10-CM | POA: Diagnosis not present

## 2017-08-03 DIAGNOSIS — Z13220 Encounter for screening for lipoid disorders: Secondary | ICD-10-CM

## 2017-08-03 DIAGNOSIS — Z01810 Encounter for preprocedural cardiovascular examination: Secondary | ICD-10-CM

## 2017-08-03 MED ORDER — HYDRALAZINE HCL 25 MG PO TABS: 25.0000 mg | ORAL_TABLET | Freq: Two times a day (BID) | ORAL | 6 refills | Status: DC

## 2017-08-03 MED FILL — CARVEDILOL 25 MG TABLET: 25 | 30 days supply | Qty: 60 | Fill #0

## 2017-08-03 MED FILL — ISOSORBIDE MN ER 60 MG TAB: 60 | 30 days supply | Qty: 30 | Fill #4

## 2017-08-03 MED FILL — AMLODIPINE BESYLATE 10 MG T: 10 | 30 days supply | Qty: 30 | Fill #3

## 2017-08-03 NOTE — Patient Instructions (Addendum)
Medication Instructions: Your physician recommends that you continue on your current medications as directed. Please refer to the Current Medication list given to you today.  STOP Clonidine  START Hydralazine 25 mg twice daily.  Labwork: Your physician recommends that you return for a FASTING lipid profile and hepatic function panel today.   Testing/Procedures: Your physician has requested that you have an echocardiogram. Echocardiography is a painless test that uses sound waves to create images of your heart. It provides your doctor with information about the size and shape of your heart and how well your heart's chambers and valves are working. This procedure takes approximately one hour. There are no restrictions for this procedure.  Follow-Up: Your physician wants you to follow-up in: 1 year with Dr. [name] if all testing normal. You will receive a reminder letter in the mail two months in advance. If you don't receive a letter, please call our office to schedule the follow-up appointment.   Any Other Special Instructions are lister below:  Suanne Marker will send your clearance information to Dr. Johney Maine.  Your gaol blood pressure is 130/80    If you need a refill on your cardiac medications before your next appointment, please call your pharmacy.

## 2017-08-03 NOTE — Progress Notes (Signed)
Cardiology Office Note   Date:  08/03/2017 nu  ID:  Jason Ducharme., DOB 10-Oct-1960, MRN [id number]  PCP:  [name], FNP  [place]  [facility] in [place], [place]  Address: [address], [place], [place] [address]  Phone: [phone] Cardiologist: Dr. [name], 01/18/2017 in hospital Rosaria Ferries, PA-C   Chief Complaint  Patient presents with  . Follow-up    Surgical clearance.    History of Present Illness: Jason [name]. is a 57 y.o. male with a history of HTN, CKD>>ESRD on HD, HTN, S-D-CHF, anemia, non-compliance w/ meds and appts, chronic venous stasis and history of stasis dermatitis  Admitted 01/16/2017-01/25/2017 for hypertensive urgency and CHF, EF now 35-40%, dialysis initiated, Patient Refused cath 6/27 phone notes regarding bilateral hernia repair, appointment made  Jason [name]. presents for cardiology follow up.  He is not missing any HD appts.   Breathing is good now, he can walk and run some.   His activity is limited by his hernia. He wants to get this fixed.   He never gets chest pain.   He can walk up a flight of stairs without stopping.   He has no palpitations, does not feel his heart skip or race. Was not aware of anything when he had the VT in the hospital.  No presyncope or syncope.   He has seen Dr Nuriddin in the past, likes the naturopathic approach.   Past Medical History:  Diagnosis Date  . AKI (acute kidney injury) ([place]) 01/2015  . Anemia   . CHF (congestive heart failure) ([place])   . Elevated LFTs    per patient  . End stage renal disease [facility])    Dialysis T/Th/Sa  Started 02/2017  . Hypertension   . Pneumonia     Past Surgical History:  Procedure Laterality Date  . AV FISTULA PLACEMENT Right 01/20/2017   Procedure: ARTERIOVENOUS (AV) FISTULA CREATION;  Surgeon: Angelia Mould, MD;  Location: [place];  Service: Vascular;  Laterality: Right;  . INSERTION OF DIALYSIS CATHETER Right  01/20/2017   Procedure: INSERTION OF DIALYSIS CATHETER;  Surgeon: Angelia Mould, MD;  Location: [place];  Service: Vascular;  Laterality: Right;  . IR FLUORO GUIDE CV LINE RIGHT  01/18/2017  . IR US GUIDE VASC ACCESS RIGHT  01/18/2017  . TOE SURGERY    . TONSILLECTOMY      Current Outpatient Medications  Medication Sig Dispense Refill  . albuterol (PROVENTIL HFA;VENTOLIN HFA) 108 (90 Base) MCG/ACT inhaler Inhale 2 puffs into the lungs every 4 (four) hours as needed for wheezing or shortness of breath (cough, shortness of breath or wheezing.). 1 Inhaler 1  . amLODipine (NORVASC) 10 MG tablet Take 1 tablet (10 mg total) by mouth daily. (Patient taking differently: Take 10 mg by mouth at bedtime. ) 90 tablet 1  . calcium acetate (PHOSLO) 667 MG capsule Take 1 capsule (667 mg total) by mouth 3 (three) times daily with meals. 90 capsule 1  . carvedilol (COREG) 25 MG tablet Take 1 tablet (25 mg total) by mouth 2 (two) times daily with a meal. 90 tablet 1  . cloNIDine (CATAPRES) 0.1 MG tablet Take 1 tablet (0.1 mg total) by mouth 2 (two) times daily. 180 tablet 1  . isosorbide mononitrate (IMDUR) 60 MG 24 hr tablet Take 1 tablet (60 mg total) by mouth daily. 90 tablet 1  . lidocaine-prilocaine (EMLA) cream Apply 1 application topically See admin instructions.  Apply 1-2 hours before dialysis. Cover with occlusive dressing 3 days weekly (Tuesday, Thursday, & Saturdays)  5  . Multiple Vitamin (MULTIVITAMIN WITH MINERALS) TABS tablet Take 2 tablets by mouth 2 (two) times daily. Life 120 vitamin    . Multiple Vitamins-Minerals (ESSENTIAL BALANCE PO) Take 1 tablet by mouth daily.    . Nutritional Supplements (FEEDING SUPPLEMENT, NEPRO CARB STEADY,) LIQD Take 237 mLs by mouth 3 (three) times daily with meals. 30 Can 2   No current facility-administered medications for this visit.     Allergies:   Lisinopril    Social History:  The patient  reports that he has quit smoking. He has never used smokeless  tobacco. He reports that he drank alcohol. He reports that he has current or past drug history.   Family History:  The patient's family history includes Healthy in his brother and father; Heart attack (age of onset: 54) in his mother; Heart disease in his mother; Hypertension in his mother.    ROS:  Please see the history of present illness. All other systems are reviewed and negative.    PHYSICAL EXAM: VS:  BP (!) 144/98 (BP Location: Left Arm, Patient Position: Sitting, Cuff Size: Normal)   Pulse 77   Ht 6\' 2"  (1.88 m)   Wt 184 lb (83.5 kg)   BMI 23.62 kg/m  , BMI Body mass index is 23.62 kg/m. GEN: Well nourished, well developed, male in no acute distress  HEENT: normal for age  Neck: minimal JVD, no carotid bruit, no masses Cardiac: RRR; soft murmur, no rubs, or gallops Respiratory:  clear to auscultation bilaterally, normal work of breathing GI: soft, nontender, nondistended, + BS MS: no deformity or atrophy; no edema; distal pulses are 2+ in all 4 extremities   Skin: warm and dry, no rash Neuro:  Strength and sensation are intact Psych: euthymic mood, full affect   EKG:  EKG is ordered today. The ekg ordered today demonstrates   ECHO: 01/17/2017 - Left ventricle: The cavity size was normal. There was moderate   concentric hypertrophy. Systolic function was moderately reduced.   The estimated ejection fraction was in the range of 35% to 40%.   Moderate diffuse hypokinesis. Features are consistent with a   pseudonormal left ventricular filling pattern, with concomitant   abnormal relaxation and increased filling pressure (grade 2   diastolic dysfunction). Doppler parameters are consistent with   high ventricular filling pressure. - Aortic valve: Trileaflet; mildly thickened, mildly calcified   leaflets. - Mitral valve: There was mild regurgitation. - Right ventricle: The cavity size was severely dilated. Wall   thickness was normal. - Right atrium: The atrium was  severely dilated. - Tricuspid valve: There was mild-moderate regurgitation. - Pulmonary arteries: PA peak pressure: 64 mm Hg (S). - Pericardium, extracardiac: A trivial, free-flowing pericardial   effusion was identified circumferential to the heart. The fluid   had no internal echoes.  Impressions:  - The right ventricular systolic pressure was increased consistent   with moderate pulmonary hypertension.  Recent Labs: 01/16/2017: B Natriuretic Peptide >4,500.0 03/30/2017: ALT 23 07/06/2017: BUN 38; Creatinine, Ser 5.70; Hemoglobin 12.3; Platelets 190; Potassium 3.8; Sodium 142    Lipid Panel    Component Value Date/Time   CHOL 132 01/31/2015 0300   TRIG 90 01/31/2015 0300   HDL 34 (L) 01/31/2015 0300   CHOLHDL 3.9 01/31/2015 0300   VLDL 18 01/31/2015 0300   LDLCALC 80 01/31/2015 0300     Wt Readings from Last  3 Encounters:  08/03/17 184 lb (83.5 kg)  07/06/17 185 lb 6.4 oz (84.1 kg)  05/06/17 196 lb (88.9 kg)     Other studies Reviewed: Additional studies/ records that were reviewed today include: Office notes, hospital records and testing.  ASSESSMENT AND PLAN:  1.  Chronic combined systolic and diastolic CHF: Volume management is currently with dialysis.  This is going well.  He is breathing better and no significant volume overload by exam. -Continue beta-blocker and nitrates. -Add hydralazine - We will repeat the echocardiogram, but this does not have to be done prior to surgery. - He still refuses a cardiac catheterization  2.  Preop cardiovascular evaluation: -Although his EF was decreased on last echo, there are no regional wall motion abnormalities. - His activity level is good, once his volume status is stabilized. - He can achieve well over 4 METS, his only limitation is currently the hernias themselves. - No further cardiac work-up is required at this time and he is at acceptable risk for the planned procedure without any additional testing.  3.   Hypertension: - His diastolic pressure is still high.  The dose of clonidine is low. - Because of his CHF, he would benefit from hydralazine.  I will stop the clonidine and start hydralazine, which can be uptitrated. -For compliance issues, I will only start the hydralazine at twice daily instead of 3 times daily or 4 times daily dosing. -If his EF does not improve, discuss with MD and alternative to amlodipine.  4.  Abnormal LFTs - Recheck today  5.  Unknown lipid status: -Check lipids today.   Current medicines are reviewed at length with the patient today.  The patient does not have concerns regarding medicines.  The following changes have been made: Stop clonidine, add hydralazine  Labs/ tests ordered today include:   Orders Placed This Encounter  Procedures  . Lipid panel  . Hepatic function panel  . EKG 12-Lead  . ECHOCARDIOGRAM COMPLETE     Disposition:   FU with Dr. [name]  Signed, Rosaria Ferries, PA-C  08/03/2017 2:13 PM    [facility] Phone: [phone]; Fax: [phone]  This note was written with the assistance of speech recognition software. Please excuse any transcriptional errors.

## 2017-08-04 DIAGNOSIS — Z992 Dependence on renal dialysis: Secondary | ICD-10-CM | POA: Diagnosis not present

## 2017-08-04 DIAGNOSIS — D509 Iron deficiency anemia, unspecified: Secondary | ICD-10-CM | POA: Diagnosis not present

## 2017-08-04 DIAGNOSIS — N2581 Secondary hyperparathyroidism of renal origin: Secondary | ICD-10-CM | POA: Diagnosis not present

## 2017-08-04 DIAGNOSIS — N186 End stage renal disease: Secondary | ICD-10-CM | POA: Diagnosis not present

## 2017-08-04 LAB — HEPATIC FUNCTION PANEL
ALK PHOS: 46 IU/L (ref 39–117)
ALT: 10 IU/L (ref 0–44)
AST: 13 IU/L (ref 0–40)
Albumin: 4.1 g/dL (ref 3.5–5.5)
BILIRUBIN, DIRECT: 0.15 mg/dL (ref 0.00–0.40)
Bilirubin Total: 0.5 mg/dL (ref 0.0–1.2)
Total Protein: 7.2 g/dL (ref 6.0–8.5)

## 2017-08-04 LAB — LIPID PANEL
Chol/HDL Ratio: 2.8 ratio (ref 0.0–5.0)
Cholesterol, Total: 138 mg/dL (ref 100–199)
HDL: 50 mg/dL (ref >39)
LDL Calculated: 77 mg/dL (ref 0–99)
Triglycerides: 57 mg/dL (ref 0–149)
VLDL Cholesterol Cal: 11 mg/dL (ref 5–40)

## 2017-08-05 ENCOUNTER — Ambulatory Visit: Payer: Self-pay | Admitting: Family Medicine

## 2017-08-06 DIAGNOSIS — N2581 Secondary hyperparathyroidism of renal origin: Secondary | ICD-10-CM | POA: Diagnosis not present

## 2017-08-06 DIAGNOSIS — D509 Iron deficiency anemia, unspecified: Secondary | ICD-10-CM | POA: Diagnosis not present

## 2017-08-06 DIAGNOSIS — N186 End stage renal disease: Secondary | ICD-10-CM | POA: Diagnosis not present

## 2017-08-06 DIAGNOSIS — Z992 Dependence on renal dialysis: Secondary | ICD-10-CM | POA: Diagnosis not present

## 2017-08-08 ENCOUNTER — Telehealth: Payer: Self-pay | Admitting: Physician Assistant

## 2017-08-08 NOTE — Telephone Encounter (Signed)
Spoke with pt, aware of lab results. 

## 2017-08-08 NOTE — Telephone Encounter (Signed)
Patient returning call from last week to the Nurse.

## 2017-08-09 DIAGNOSIS — Z992 Dependence on renal dialysis: Secondary | ICD-10-CM | POA: Diagnosis not present

## 2017-08-09 DIAGNOSIS — D509 Iron deficiency anemia, unspecified: Secondary | ICD-10-CM | POA: Diagnosis not present

## 2017-08-09 DIAGNOSIS — N186 End stage renal disease: Secondary | ICD-10-CM | POA: Diagnosis not present

## 2017-08-09 DIAGNOSIS — N2581 Secondary hyperparathyroidism of renal origin: Secondary | ICD-10-CM | POA: Diagnosis not present

## 2017-08-11 DIAGNOSIS — Z992 Dependence on renal dialysis: Secondary | ICD-10-CM | POA: Diagnosis not present

## 2017-08-11 DIAGNOSIS — D509 Iron deficiency anemia, unspecified: Secondary | ICD-10-CM | POA: Diagnosis not present

## 2017-08-11 DIAGNOSIS — N186 End stage renal disease: Secondary | ICD-10-CM | POA: Diagnosis not present

## 2017-08-11 DIAGNOSIS — N2581 Secondary hyperparathyroidism of renal origin: Secondary | ICD-10-CM | POA: Diagnosis not present

## 2017-08-11 DIAGNOSIS — I129 Hypertensive chronic kidney disease with stage 1 through stage 4 chronic kidney disease, or unspecified chronic kidney disease: Secondary | ICD-10-CM | POA: Diagnosis not present

## 2017-08-13 DIAGNOSIS — N2581 Secondary hyperparathyroidism of renal origin: Secondary | ICD-10-CM | POA: Diagnosis not present

## 2017-08-13 DIAGNOSIS — D509 Iron deficiency anemia, unspecified: Secondary | ICD-10-CM | POA: Diagnosis not present

## 2017-08-13 DIAGNOSIS — N186 End stage renal disease: Secondary | ICD-10-CM | POA: Diagnosis not present

## 2017-08-13 DIAGNOSIS — Z992 Dependence on renal dialysis: Secondary | ICD-10-CM | POA: Diagnosis not present

## 2017-08-15 ENCOUNTER — Telehealth: Payer: Self-pay

## 2017-08-15 NOTE — Telephone Encounter (Signed)
-----   Message from [name], [place] sent at 08/11/2017  6:16 PM EDT ----- Regarding: "Follow Up" Morey Hummingbird,  Please contact patient to remind him that it is important for him to follow up. I have never seen patient, and he has had several cancellations with Maudie Mercury. Possibly transportation problems? Remind him that he needs to be assessed in office with labs, blood pressures, etc in order to continue to get refills on medications to ensure that his meds continue to be safe for him.    Andria Frames,  Please schedule patient for follow up appointment as soon as he can come in to office.   Thanks.

## 2017-08-15 NOTE — Telephone Encounter (Signed)
Patient notified and will schedule appointment 

## 2017-08-16 DIAGNOSIS — Z992 Dependence on renal dialysis: Secondary | ICD-10-CM | POA: Diagnosis not present

## 2017-08-16 DIAGNOSIS — N2581 Secondary hyperparathyroidism of renal origin: Secondary | ICD-10-CM | POA: Diagnosis not present

## 2017-08-16 DIAGNOSIS — N186 End stage renal disease: Secondary | ICD-10-CM | POA: Diagnosis not present

## 2017-08-16 DIAGNOSIS — D509 Iron deficiency anemia, unspecified: Secondary | ICD-10-CM | POA: Diagnosis not present

## 2017-08-17 ENCOUNTER — Other Ambulatory Visit: Payer: Self-pay

## 2017-08-17 ENCOUNTER — Ambulatory Visit (HOSPITAL_COMMUNITY): Payer: Medicare Other | Attending: Cardiology

## 2017-08-17 DIAGNOSIS — I5042 Chronic combined systolic (congestive) and diastolic (congestive) heart failure: Secondary | ICD-10-CM | POA: Diagnosis not present

## 2017-08-17 DIAGNOSIS — I5189 Other ill-defined heart diseases: Secondary | ICD-10-CM

## 2017-08-17 DIAGNOSIS — I110 Hypertensive heart disease with heart failure: Secondary | ICD-10-CM | POA: Diagnosis not present

## 2017-08-17 DIAGNOSIS — I517 Cardiomegaly: Secondary | ICD-10-CM

## 2017-08-17 HISTORY — DX: Other ill-defined heart diseases: I51.89

## 2017-08-17 HISTORY — DX: Cardiomegaly: I51.7

## 2017-08-18 DIAGNOSIS — N186 End stage renal disease: Secondary | ICD-10-CM | POA: Diagnosis not present

## 2017-08-18 DIAGNOSIS — N2581 Secondary hyperparathyroidism of renal origin: Secondary | ICD-10-CM | POA: Diagnosis not present

## 2017-08-18 DIAGNOSIS — D509 Iron deficiency anemia, unspecified: Secondary | ICD-10-CM | POA: Diagnosis not present

## 2017-08-18 DIAGNOSIS — Z992 Dependence on renal dialysis: Secondary | ICD-10-CM | POA: Diagnosis not present

## 2017-08-20 DIAGNOSIS — D509 Iron deficiency anemia, unspecified: Secondary | ICD-10-CM | POA: Diagnosis not present

## 2017-08-20 DIAGNOSIS — N2581 Secondary hyperparathyroidism of renal origin: Secondary | ICD-10-CM | POA: Diagnosis not present

## 2017-08-20 DIAGNOSIS — N186 End stage renal disease: Secondary | ICD-10-CM | POA: Diagnosis not present

## 2017-08-20 DIAGNOSIS — Z992 Dependence on renal dialysis: Secondary | ICD-10-CM | POA: Diagnosis not present

## 2017-08-23 ENCOUNTER — Ambulatory Visit: Payer: Self-pay | Admitting: Family Medicine

## 2017-08-23 DIAGNOSIS — D509 Iron deficiency anemia, unspecified: Secondary | ICD-10-CM | POA: Diagnosis not present

## 2017-08-23 DIAGNOSIS — N2581 Secondary hyperparathyroidism of renal origin: Secondary | ICD-10-CM | POA: Diagnosis not present

## 2017-08-23 DIAGNOSIS — Z992 Dependence on renal dialysis: Secondary | ICD-10-CM | POA: Diagnosis not present

## 2017-08-23 DIAGNOSIS — N186 End stage renal disease: Secondary | ICD-10-CM | POA: Diagnosis not present

## 2017-08-24 ENCOUNTER — Telehealth: Payer: Self-pay | Admitting: Physician Assistant

## 2017-08-24 ENCOUNTER — Ambulatory Visit: Payer: Self-pay | Admitting: Family Medicine

## 2017-08-24 ENCOUNTER — Ambulatory Visit (INDEPENDENT_AMBULATORY_CARE_PROVIDER_SITE_OTHER): Payer: Medicare Other | Admitting: Family Medicine

## 2017-08-24 ENCOUNTER — Encounter: Payer: Self-pay | Admitting: Family Medicine

## 2017-08-24 VITALS — BP 142/90 | HR 66 | Temp 98.6°F | Ht 74.0 in | Wt 183.0 lb

## 2017-08-24 DIAGNOSIS — Z09 Encounter for follow-up examination after completed treatment for conditions other than malignant neoplasm: Secondary | ICD-10-CM | POA: Diagnosis not present

## 2017-08-24 DIAGNOSIS — I10 Essential (primary) hypertension: Secondary | ICD-10-CM | POA: Diagnosis not present

## 2017-08-24 DIAGNOSIS — R8290 Unspecified abnormal findings in urine: Secondary | ICD-10-CM

## 2017-08-24 DIAGNOSIS — L918 Other hypertrophic disorders of the skin: Secondary | ICD-10-CM

## 2017-08-24 LAB — POCT URINALYSIS DIP (MANUAL ENTRY)
Bilirubin, UA: NEGATIVE
Blood, UA: NEGATIVE
Glucose, UA: NEGATIVE mg/dL
Ketones, POC UA: NEGATIVE mg/dL
Nitrite, UA: NEGATIVE
Protein Ur, POC: 100 mg/dL — AB
Spec Grav, UA: 1.025 (ref 1.010–1.025)
Urobilinogen, UA: 0.2 E.U./dL
pH, UA: 5 (ref 5.0–8.0)

## 2017-08-24 MED ORDER — CALCIUM ACETATE (PHOS BINDER) 667 MG PO CAPS: 667.0000 mg | ORAL_CAPSULE | Freq: Three times a day (TID) | ORAL | 1 refills | Status: DC

## 2017-08-24 MED ORDER — SULFAMETHOXAZOLE-TRIMETHOPRIM 800-160 MG PO TABS: 1.0000 | ORAL_TABLET | Freq: Two times a day (BID) | ORAL | 0 refills | Status: DC

## 2017-08-24 NOTE — Telephone Encounter (Signed)
New problem   Pt returning call to nurse concerning results

## 2017-08-24 NOTE — Telephone Encounter (Signed)
Called patient, gave results of ECHO.  Patient verified understanding, no questions or concerns.

## 2017-08-24 NOTE — Progress Notes (Signed)
Follow Up  Subjective:    Patient ID: Jason [name]., male    DOB: [birth year], 57 y.o.   MRN: [id number]   Chief Complaint  Patient presents with  . Follow-up    Chronic conditions   HPI  Mr. Jason [name] has a past medical history of Hypertension, ESRD, CHF, Anemia, and AKI. He is here today for follow up.  Current Status: Since his last office visit, he is doing/ well, but has c/o of upper left chest skin tag. He states that he has always had this skin tag, but it is now getting bigger and changing.   He has an abdominal hernia, and a left and right groin hernia. He is schedule for a triple hernia repair 09/2017.  He denies fevers, chills, fatigue, recent infections, weight loss, and night sweats.   He has not had any headaches, visual changes, dizziness, and falls.   No chest pain, heart palpitations, cough and shortness of breath reported.   No reports of GI problems such as nausea, vomiting, diarrhea, and constipation. He has no reports of blood in stools, dysuria and hematuria.   No depression or anxiety reported.   He denies pain today.   Past Medical History:  Diagnosis Date  . AKI (acute kidney injury) ([place]) 01/2015  . Anemia   . CHF (congestive heart failure) ([place])   . Elevated LFTs    per patient  . End stage renal disease [facility])    Dialysis T/Th/Sa  Started 02/2017  . Hypertension   . Pneumonia     Family History  Problem Relation Age of Onset  . Heart attack Mother 29       CABG hx  . Heart disease Mother   . Hypertension Mother   . Healthy Father   . Healthy Brother     Social History   Socioeconomic History  . Marital status: Single    Spouse name: Not on file  . Number of children: Not on file  . Years of education: Not on file  . Highest education level: Not on file  Occupational History  . Not on file  Social Needs  . Financial resource strain: Not on file  . Food insecurity:    Worry: Not on file    Inability: Not on file  . Transportation  needs:    Medical: Not on file    Non-medical: Not on file  Tobacco Use  . Smoking status: Former Research scientist (life sciences)  . Smokeless tobacco: Never Used  . Tobacco comment: quit smoking in 1997  Substance and Sexual Activity  . Alcohol use: Not Currently  . Drug use: Not Currently    Comment: marijuana  . Sexual activity: Not on file  Lifestyle  . Physical activity:    Days per week: Not on file    Minutes per session: Not on file  . Stress: Not on file  Relationships  . Social connections:    Talks on phone: Not on file    Gets together: Not on file    Attends religious service: Not on file    Active member of club or organization: Not on file    Attends meetings of clubs or organizations: Not on file    Relationship status: Not on file  . Intimate partner violence:    Fear of current or ex partner: Not on file    Emotionally abused: Not on file    Physically abused: Not on file    Forced sexual activity: Not on  file  Other Topics Concern  . Not on file  Social History Narrative  . Not on file    Past Surgical History:  Procedure Laterality Date  . AV FISTULA PLACEMENT Right 01/20/2017   Procedure: ARTERIOVENOUS (AV) FISTULA CREATION;  Surgeon: Angelia Mould, MD;  Location: [place];  Service: Vascular;  Laterality: Right;  . INSERTION OF DIALYSIS CATHETER Right 01/20/2017   Procedure: INSERTION OF DIALYSIS CATHETER;  Surgeon: Angelia Mould, MD;  Location: [place];  Service: Vascular;  Laterality: Right;  . IR FLUORO GUIDE CV LINE RIGHT  01/18/2017  . IR US GUIDE VASC ACCESS RIGHT  01/18/2017  . TOE SURGERY    . TONSILLECTOMY      There is no immunization history on file for this patient.  Current Meds  Medication Sig  . albuterol (PROVENTIL HFA;VENTOLIN HFA) 108 (90 Base) MCG/ACT inhaler Inhale 2 puffs into the lungs every 4 (four) hours as needed for wheezing or shortness of breath (cough, shortness of breath or wheezing.).  Marland Kitchen amLODipine (NORVASC) 10 MG tablet Take 1  tablet (10 mg total) by mouth daily. (Patient taking differently: Take 10 mg by mouth at bedtime. )  . calcium acetate (PHOSLO) 667 MG capsule Take 1 capsule (667 mg total) by mouth 3 (three) times daily with meals.  . carvedilol (COREG) 25 MG tablet Take 1 tablet (25 mg total) by mouth 2 (two) times daily with a meal.  . hydrALAZINE (APRESOLINE) 25 MG tablet Take 1 tablet (25 mg total) by mouth 2 (two) times daily.  . isosorbide mononitrate (IMDUR) 60 MG 24 hr tablet Take 1 tablet (60 mg total) by mouth daily.  Marland Kitchen lidocaine-prilocaine (EMLA) cream Apply 1 application topically See admin instructions. Apply 1-2 hours before dialysis. Cover with occlusive dressing 3 days weekly (Tuesday, Thursday, & Saturdays)  . [DISCONTINUED] calcium acetate (PHOSLO) 667 MG capsule Take 1 capsule (667 mg total) by mouth 3 (three) times daily with meals.    Allergies  Allergen Reactions  . Lisinopril Cough    BP (!) 142/90 (BP Location: Left Arm, Patient Position: Sitting, Cuff Size: Small)   Pulse 66   Temp 98.6 F (37 C) (Oral)   Ht 6\' 2"  (1.88 m)   Wt 183 lb (83 kg)   SpO2 98%   BMI 23.50 kg/m   Review of Systems  Constitutional: Negative.   HENT: Negative.   Respiratory: Negative.   Cardiovascular: Negative.   Gastrointestinal: Negative.   Genitourinary: Negative.   Musculoskeletal: Negative.   Skin: Negative.        Upper left chest skin tag.    Neurological: Positive for dizziness (Occasionally).  Hematological: Negative.   Psychiatric/Behavioral: Negative.   All other systems reviewed and are negative.  Objective:   Physical Exam  Constitutional: He is oriented to person, place, and time.  Cardiovascular: Normal rate, regular rhythm, normal heart sounds and intact distal pulses.  Pulmonary/Chest: Effort normal and breath sounds normal.  Abdominal: There is guarding (mid abdominal/left & right groin areas; r/t plan for triple hernia repair. ).  Musculoskeletal: Normal range of  motion.  Neurological: He is alert and oriented to person, place, and time.  Skin: Skin is warm and dry.  Graft in upper right arm, for use of Hemodialysis.  Upper left chest skin tag.    Assessment & Plan:   1. Essential hypertension Blood pressure is elevated at 142/90. We will possibly initiate additional antihypertensive med at next office visit. He will continue Carvedilol, Hydralazine,  and Amlodipine as prescribed. He will continue to decrease high sodium intake, excessive alcohol intake, increase potassium intake, smoking cessation, and increase physical activity of at least 30 minutes of cardio activity daily. He will continue to follow Heart Healthy or DASH diet.  - POCT urinalysis dipstick  2. Skin tag Recent changes in skin tag. We will refer him to Dermatology for further evaluation.  - Ambulatory referral to Dermatology  3. Hypocalcemia Calcium level decreased at 8.6 on 07/06/2017. He will continue Calcium supplement as prescribed. - calcium acetate (PHOSLO) 667 MG capsule; Take 1 capsule (667 mg total) by mouth 3 (three) times daily with meals.  Dispense: 90 capsule; Refill: 1  4. Abnormal urinalysis - Urine Culture  5. Follow up He will follow up in 3 months.   Meds ordered this encounter  Medications  . calcium acetate (PHOSLO) 667 MG capsule    Sig: Take 1 capsule (667 mg total) by mouth 3 (three) times daily with meals.    Dispense:  90 capsule    Refill:  1  . sulfamethoxazole-trimethoprim (BACTRIM DS,SEPTRA DS) 800-160 MG tablet    Sig: Take 1 tablet by mouth 2 (two) times daily.    Dispense:  14 tablet    Refill:  0    Referral Orders     Ambulatory referral to Dermatology  Kathe Becton,  MSN, FNP-C Patient [facility] [address] [place], [place] [address] [phone]

## 2017-08-25 DIAGNOSIS — N186 End stage renal disease: Secondary | ICD-10-CM | POA: Diagnosis not present

## 2017-08-25 DIAGNOSIS — Z992 Dependence on renal dialysis: Secondary | ICD-10-CM | POA: Diagnosis not present

## 2017-08-25 DIAGNOSIS — D509 Iron deficiency anemia, unspecified: Secondary | ICD-10-CM | POA: Diagnosis not present

## 2017-08-25 DIAGNOSIS — N2581 Secondary hyperparathyroidism of renal origin: Secondary | ICD-10-CM | POA: Diagnosis not present

## 2017-08-26 LAB — URINE CULTURE: Organism ID, Bacteria: NO GROWTH

## 2017-08-27 DIAGNOSIS — D509 Iron deficiency anemia, unspecified: Secondary | ICD-10-CM | POA: Diagnosis not present

## 2017-08-27 DIAGNOSIS — Z992 Dependence on renal dialysis: Secondary | ICD-10-CM | POA: Diagnosis not present

## 2017-08-27 DIAGNOSIS — N2581 Secondary hyperparathyroidism of renal origin: Secondary | ICD-10-CM | POA: Diagnosis not present

## 2017-08-27 DIAGNOSIS — N186 End stage renal disease: Secondary | ICD-10-CM | POA: Diagnosis not present

## 2017-08-30 ENCOUNTER — Telehealth: Payer: Self-pay | Admitting: Cardiology

## 2017-08-30 DIAGNOSIS — N2581 Secondary hyperparathyroidism of renal origin: Secondary | ICD-10-CM | POA: Diagnosis not present

## 2017-08-30 DIAGNOSIS — N186 End stage renal disease: Secondary | ICD-10-CM | POA: Diagnosis not present

## 2017-08-30 DIAGNOSIS — D509 Iron deficiency anemia, unspecified: Secondary | ICD-10-CM | POA: Diagnosis not present

## 2017-08-30 DIAGNOSIS — Z992 Dependence on renal dialysis: Secondary | ICD-10-CM | POA: Diagnosis not present

## 2017-08-30 NOTE — Telephone Encounter (Signed)
Spoke with pt who states Sunday he passed out and his blood pressure was 80/50. He states he was instructed by his Nephrologist to stop hydralazine if he starts having symptoms and start back taking clonidine 0.1mg . Pt calling to inform Dr. [name]. Routing to MD

## 2017-08-30 NOTE — Telephone Encounter (Signed)
Spoke with pt, Aware of dr crenshaw's recommendations.  Follow up scheduled  

## 2017-08-30 NOTE — Telephone Encounter (Signed)
Stop clonidine and hydralazine and follow BP; paov Kirk Ruths

## 2017-08-30 NOTE — Telephone Encounter (Signed)
New Message:  Pt c/o medication issue:  1. Name of Medication:hydrALAZINE (APRESOLINE) 25 MG tablet  2. How are you currently taking this medication (dosage and times per day)?Take 1 tablet (25 mg total) by mouth 2 (two) times daily.  3. Are you having a reaction (difficulty breathing--STAT)? No  4. What is your medication issue? Pt states the medication is causing him to pass out

## 2017-09-01 DIAGNOSIS — Z992 Dependence on renal dialysis: Secondary | ICD-10-CM | POA: Diagnosis not present

## 2017-09-01 DIAGNOSIS — D509 Iron deficiency anemia, unspecified: Secondary | ICD-10-CM | POA: Diagnosis not present

## 2017-09-01 DIAGNOSIS — N186 End stage renal disease: Secondary | ICD-10-CM | POA: Diagnosis not present

## 2017-09-01 DIAGNOSIS — N2581 Secondary hyperparathyroidism of renal origin: Secondary | ICD-10-CM | POA: Diagnosis not present

## 2017-09-03 DIAGNOSIS — D509 Iron deficiency anemia, unspecified: Secondary | ICD-10-CM | POA: Diagnosis not present

## 2017-09-03 DIAGNOSIS — N186 End stage renal disease: Secondary | ICD-10-CM | POA: Diagnosis not present

## 2017-09-03 DIAGNOSIS — N2581 Secondary hyperparathyroidism of renal origin: Secondary | ICD-10-CM | POA: Diagnosis not present

## 2017-09-03 DIAGNOSIS — Z992 Dependence on renal dialysis: Secondary | ICD-10-CM | POA: Diagnosis not present

## 2017-09-06 ENCOUNTER — Encounter (HOSPITAL_COMMUNITY): Payer: Self-pay

## 2017-09-06 DIAGNOSIS — Z992 Dependence on renal dialysis: Secondary | ICD-10-CM | POA: Diagnosis not present

## 2017-09-06 DIAGNOSIS — N2581 Secondary hyperparathyroidism of renal origin: Secondary | ICD-10-CM | POA: Diagnosis not present

## 2017-09-06 DIAGNOSIS — N186 End stage renal disease: Secondary | ICD-10-CM | POA: Diagnosis not present

## 2017-09-06 DIAGNOSIS — D509 Iron deficiency anemia, unspecified: Secondary | ICD-10-CM | POA: Diagnosis not present

## 2017-09-06 NOTE — Progress Notes (Signed)
Cardiology Office Note   Date:  09/07/2017   ID:  Jason Mariner., DOB 06/04/1960, MRN [id number]  PCP:  [name], FNP  Cardiologist: Dr. [name] Chief Complaint  Patient presents with  . Follow-up    last sunday passed out, denies chest pains, SOB, swelling     History of Present Illness: Jason Dahir. is a 57 y.o. male who presents for ongoing assessment and management of hypertension, history of mixed CHF, chronic venous stasis, chronic kidney disease with hemodialysis, and anemia.  Also a history of medical noncompliance with medications and appointments.  Most recent echocardiogram revealed EF of 35% to 40%.  The patient refused cardiac catheterization.  He was last seen in the office on 08/03/2017 by Jason Ferries, PA-C, and was cleared to have hernia repair.  The patient called our office on 08/30/2017 stating that when he takes his medications he feels as if he is going to pass out.  Specifically he mentioned hydralazine.  He was asked to stop taking clonidine and hydralazine and follow-up in our office for blood pressure check and further evaluation.  He states that he started a health store medication called Circu-Flow along with the prior medications. Once he began this, continued his dialysis, BP dropped to 73'A systolic He actually did pass out once before calling us. He has not had any further episodes since stopping hydralazine and clonidine. He continues dialysis ESRD.   Past Medical History:  Diagnosis Date  . A-V fistula ([facility])    Right arm  . Abdominal hernia   . AKI (acute kidney injury) ([place]) 01/2015  . Anasarca   . Anemia   . Bilateral inguinal hernia   . Bilateral pleural effusion 01/2017  . Cellulitis 01/16/2017   Bilateral lower extremity  . CHF (congestive heart failure) ([place])   . Chronic venous insufficiency   . Concentric left ventricular hypertrophy 08/17/2017   Moderated, noted on ECHO  . Diastolic dysfunction 19/37/9024   noted on ECHO    . Elevated LFTs    per patient, resolved  . End stage renal disease [facility])    Dialysis T/Th/Sa  Started 02/2017  . Hypertension   . Pneumonia   . Pulmonary hypertension ([facility])    Moderate  . Venous stasis ulcer ([place])     Past Surgical History:  Procedure Laterality Date  . AV FISTULA PLACEMENT Right 01/20/2017   Procedure: ARTERIOVENOUS (AV) FISTULA CREATION;  Surgeon: Angelia Mould, MD;  Location: [place];  Service: Vascular;  Laterality: Right;  . INSERTION OF DIALYSIS CATHETER Right 01/20/2017   Procedure: INSERTION OF DIALYSIS CATHETER;  Surgeon: Angelia Mould, MD;  Location: [place];  Service: Vascular;  Laterality: Right;  . IR FLUORO GUIDE CV LINE RIGHT  01/18/2017  . IR US GUIDE VASC ACCESS RIGHT  01/18/2017  . TOE SURGERY    . TONSILLECTOMY       Current Outpatient Medications  Medication Sig Dispense Refill  . ALOE VERA JUICE LIQD Take 30 mLs by mouth daily.    Marland Kitchen amLODipine (NORVASC) 10 MG tablet Take 1 tablet (10 mg total) by mouth daily. (Patient taking differently: Take 10 mg by mouth at bedtime. ) 90 tablet 1  . calcium acetate (PHOSLO) 667 MG capsule Take 1 capsule (667 mg total) by mouth 3 (three) times daily with meals. 90 capsule 1  . isosorbide mononitrate (IMDUR) 60 MG 24 hr tablet Take 1 tablet (60 mg total) by mouth daily. 90 tablet 1  .  lidocaine-prilocaine (EMLA) cream Apply 1 application topically See admin instructions. Apply 1-2 hours before dialysis. Cover with occlusive dressing 3 days weekly (Tuesday, Thursday, & Saturdays)  5  . Multiple Vitamins-Minerals (ESSENTIAL BALANCE PO) Take 1 tablet by mouth daily.    Marland Kitchen tetrahydrozoline 0.05 % ophthalmic solution Place 1 drop into both eyes daily.    Marland Kitchen UNABLE TO FIND Take 1 capsule by mouth 3 (three) times daily. Circu Flow-- herbal supplement    . hydrALAZINE (APRESOLINE) 25 MG tablet Take 1 tablet (25 mg total) by mouth 2 (two) times daily. (Patient not taking: Reported on 09/07/2017) 60 tablet 6  .  sulfamethoxazole-trimethoprim (BACTRIM DS,SEPTRA DS) 800-160 MG tablet Take 1 tablet by mouth 2 (two) times daily. (Patient not taking: Reported on 09/07/2017) 14 tablet 0   No current facility-administered medications for this visit.     Allergies:   Lisinopril    Social History:  The patient  reports that he has quit smoking. He has never used smokeless tobacco. He reports that he drinks alcohol. He reports that he has current or past drug history.   Family History:  The patient's family history includes Healthy in his brother and father; Heart attack (age of onset: 29) in his mother; Heart disease in his mother; Hypertension in his mother.    ROS: All other systems are reviewed and negative. Unless otherwise mentioned in H&P    PHYSICAL EXAM: VS:  BP (!) 142/80 (BP Location: Left Arm, Patient Position: Sitting)   Pulse 85   Ht 6\' 2"  (1.88 m)   Wt 183 lb 6.4 oz (83.2 kg)   SpO2 96%   BMI 23.55 kg/m  , BMI Body mass index is 23.55 kg/m. GEN: Well nourished, well developed, in no acute distress  HEENT: normal  Neck: no JVD, carotid bruits, or masses Cardiac: RRR; no murmurs, rubs, or gallops,no edema  Respiratory:  clear to auscultation bilaterally, normal work of breathing GI: soft, nontender, nondistended, + BS MS: no deformity or atrophy. Dialysis shunt in upper right arm with good palpable thrill.   Skin: warm and dry, no rash Neuro:  Strength and sensation are intact Psych: euthymic mood, full affect   EKG:  Not completed this office visit.   Recent Labs: 01/16/2017: B Natriuretic Peptide >4,500.0 07/06/2017: BUN 38; Creatinine, Ser 5.70; Hemoglobin 12.3; Platelets 190; Potassium 3.8; Sodium 142 08/03/2017: ALT 10    Lipid Panel    Component Value Date/Time   CHOL 138 08/03/2017 0909   TRIG 57 08/03/2017 0909   HDL 50 08/03/2017 0909   CHOLHDL 2.8 08/03/2017 0909   CHOLHDL 3.9 01/31/2015 0300   VLDL 18 01/31/2015 0300   LDLCALC 77 08/03/2017 0909      Wt  Readings from Last 3 Encounters:  09/07/17 183 lb 6.4 oz (83.2 kg)  09/07/17 184 lb 7 oz (83.7 kg)  08/24/17 183 lb (83 kg)      Other studies Reviewed: Echocardiogram 09-11-17 Left ventricle: The cavity size was normal. There was moderate   concentric hypertrophy. Systolic function was normal. The   estimated ejection fraction was in the range of 50% to 55%. Wall   motion was normal; there were no regional wall motion   abnormalities. Doppler parameters are consistent with abnormal   left ventricular relaxation (indeterminate for quantitation of   diastolic dysfunction). - Aortic valve: Transvalvular velocity was within the normal range.   There was no stenosis. There was no regurgitation. - Mitral valve: There was trivial regurgitation. -  Right ventricle: The cavity size was moderately dilated. Wall   thickness was normal. Systolic function was reduced. - Tricuspid valve: There was trivial regurgitation. Peak RV-RA   gradient (S): 36 mm Hg. - Pulmonic valve: There was no significant regurgitation. - Pulmonary arteries: Systolic pressure was moderately increased.   PA peak pressure: 39 mm Hg (S).  ASSESSMENT AND PLAN:  1.  Syncope: This was associated with medications and addition of OTC health store medication called "Circu-Flow"  Along with dialysis. He is now better off hydralazine and clonidine. BP is slightly elevated today. Due for dialysis in am. .  2. Hypertension: He is to continue amlodipine and isosorbide. Continue to monitor BP at home as he has been doing. BP 130/80 on average at home per patient report.   3. ESRD: Continue with dialysis as directed. Labs per nephrology.    Current medicines are reviewed at length with the patient today.    Labs/ tests ordered today include:  None  Phill Myron. West Pugh, ANP, AACC   09/07/2017 2:39 PM    [facility] [address], [place], [place] [address] Phone: [phone]; Fax: [phone]

## 2017-09-06 NOTE — Patient Instructions (Addendum)
Your procedure is scheduled on: Sept. 18, 2019   Surgery Time:  12:15PM-2:45PM   Report to [facility] Main  Entrance    Report to admitting at 10:15 AM   Call this number if you have problems the morning of surgery [phone]   Do not eat food or drink liquids :After Midnight.   Do NOT smoke after Midnight   Take these medicines the morning of surgery with A SIP OF WATER: Carvedilol, Isosorbide                               You may not have any metal on your body including jewelry, and body piercings             Do not wear  lotions, powders, perfumes/cologne, or deodorant                           Men may shave face and neck.   Do not bring valuables to the hospital. [place].   Contacts, dentures or bridgework may not be worn into surgery.    Patients discharged the day of surgery will not be allowed to drive home.   Special Instructions: Bring a copy of your healthcare power of attorney and living will documents         the day of surgery if you haven't scanned them in before.              Please read over the following fact sheets you were given:   [facility] - Preparing for Surgery Before surgery, you can play an important role.  Because skin is not sterile, your skin needs to be as free of germs as possible.  You can reduce the number of germs on your skin by washing with CHG (chlorahexidine gluconate) soap before surgery.  CHG is an antiseptic cleaner which kills germs and bonds with the skin to continue killing germs even after washing. Please DO NOT use if you have an allergy to CHG or antibacterial soaps.  If your skin becomes reddened/irritated stop using the CHG and inform your nurse when you arrive at Short Stay. Do not shave (including legs and underarms) for at least 48 hours prior to the first CHG shower.  You may shave your face/neck.  Please follow these instructions carefully:  1.  Shower  with CHG Soap the night before surgery and the  morning of surgery.  2.  If you choose to wash your hair, wash your hair first as usual with your normal  shampoo.  3.  After you shampoo, rinse your hair and body thoroughly to remove the shampoo.                             4.  Use CHG as you would any other liquid soap.  You can apply chg directly to the skin and wash.  Gently with a scrungie or clean washcloth.  5.  Apply the CHG Soap to your body ONLY FROM THE NECK DOWN.   Do   not use on face/ open                           Wound  or open sores. Avoid contact with eyes, ears mouth and   genitals (private parts).                       Wash face,  Genitals (private parts) with your normal soap.             6.  Wash thoroughly, paying special attention to the area where your    surgery  will be performed.  7.  Thoroughly rinse your body with warm water from the neck down.  8.  DO NOT shower/wash with your normal soap after using and rinsing off the CHG Soap.                9.  Pat yourself dry with a clean towel.            10.  Wear clean pajamas.            11.  Place clean sheets on your bed the night of your first shower and do not  sleep with pets. Day of Surgery : Do not apply any lotions/deodorants the morning of surgery.  Please wear clean clothes to the hospital/surgery center.  FAILURE TO FOLLOW THESE INSTRUCTIONS MAY RESULT IN THE CANCELLATION OF YOUR SURGERY  PATIENT SIGNATURE_________________________________  NURSE SIGNATURE__________________________________  ________________________________________________________________________

## 2017-09-06 NOTE — Pre-Procedure Instructions (Signed)
The following are in epic: Cardiac clearance Lauretta Chester, PA 08/03/2017 EKG 08/03/2017 ECHO 08/17/2017 CXR 01/20/2017

## 2017-09-07 ENCOUNTER — Other Ambulatory Visit: Payer: Self-pay

## 2017-09-07 ENCOUNTER — Encounter: Payer: Self-pay | Admitting: Adult Health

## 2017-09-07 ENCOUNTER — Ambulatory Visit: Payer: Self-pay | Admitting: Surgery

## 2017-09-07 ENCOUNTER — Encounter (HOSPITAL_COMMUNITY): Payer: Self-pay

## 2017-09-07 ENCOUNTER — Encounter (HOSPITAL_COMMUNITY)
Admission: RE | Admit: 2017-09-07 | Discharge: 2017-09-07 | Disposition: A | Discharge status: Home / Self Care | Payer: Medicare Other | Source: Ambulatory Visit | Attending: Surgery | Admitting: Surgery

## 2017-09-07 ENCOUNTER — Ambulatory Visit (INDEPENDENT_AMBULATORY_CARE_PROVIDER_SITE_OTHER): Payer: Medicare Other | Admitting: Adult Health

## 2017-09-07 VITALS — BP 142/80 | HR 85 | Ht 74.0 in | Wt 183.4 lb

## 2017-09-07 DIAGNOSIS — N186 End stage renal disease: Secondary | ICD-10-CM

## 2017-09-07 DIAGNOSIS — I10 Essential (primary) hypertension: Secondary | ICD-10-CM | POA: Diagnosis not present

## 2017-09-07 DIAGNOSIS — R55 Syncope and collapse: Secondary | ICD-10-CM | POA: Diagnosis not present

## 2017-09-07 DIAGNOSIS — Z01818 Encounter for other preprocedural examination: Secondary | ICD-10-CM | POA: Insufficient documentation

## 2017-09-07 DIAGNOSIS — K4020 Bilateral inguinal hernia, without obstruction or gangrene, not specified as recurrent: Secondary | ICD-10-CM | POA: Insufficient documentation

## 2017-09-07 HISTORY — DX: Arteriovenous fistula, acquired: I77.0

## 2017-09-07 HISTORY — DX: Unspecified abdominal hernia without obstruction or gangrene: K46.9

## 2017-09-07 HISTORY — DX: Generalized edema: R60.1

## 2017-09-07 HISTORY — DX: Varicose veins of unspecified lower extremity with ulcer of unspecified site: L97.909

## 2017-09-07 HISTORY — DX: Bilateral inguinal hernia, without obstruction or gangrene, not specified as recurrent: K40.20

## 2017-09-07 HISTORY — DX: Pulmonary hypertension, unspecified: I27.20

## 2017-09-07 HISTORY — DX: Pleural effusion, not elsewhere classified: J90

## 2017-09-07 HISTORY — DX: Cardiomegaly: I51.7

## 2017-09-07 HISTORY — DX: Cellulitis, unspecified: L03.90

## 2017-09-07 HISTORY — DX: Other ill-defined heart diseases: I51.89

## 2017-09-07 HISTORY — DX: Venous insufficiency (chronic) (peripheral): I87.2

## 2017-09-07 HISTORY — DX: Non-pressure chronic ulcer of unspecified part of unspecified lower leg with unspecified severity: I83.009

## 2017-09-07 NOTE — H&P (Signed)
[name] Documented: 05/30/2017 9:29 AM Location: [facility] Patient #: [id number] DOB: August 21, 1960 Single / Language: Cleophus Molt / Race: Black or African American Male   History of Present Illness Adin Hector MD; 05/30/2017 10:19 AM) The patient is a 57 year old male who presents with an inguinal hernia. Note for "Inguinal hernia": ` ` ` Patient sent for surgical consultation at the request of Donia Pounds MSN, FNP-C  Chief Complaint: Inguinal hernia  The patient is a 57 year old male with end-stage renal disease dialysis dependent. Sounds like he was relying on holistic doctors but has transitioned back to allopathic medicine. Getting hypertension and kidney disease under control. Gets dialysis on Tuesdays Thursdays Saturdays now. Hypertension. Noted right groin bulging about 8 years ago. It has gradually gotten larger. Worse over the past few months. It is out all the time. Getting more difficult to reduce. Mentioned to his primary care provider. Large inguinal hernia noted Surgical consultation requested. He did have some chronic leg ulcers that have been followed by wound ostomy care. Had to wear a wound care boot for several months. However they are healed up and there is one small scab on his left leg. No other infections. Just has 1 small scab remaining. He used to smoke. He quit. He is not on any blood thinners. He claims he moves his bowels about twice a day. No prior abdominal surgery. Claims he can walk 20-30 minutes without much difficulty.   (Review of systems as stated in this history (HPI) or in the review of systems. Otherwise all other 12 point ROS are negative) ` ` `   Past Surgical History Illene Regulus, CMA; 05/30/2017 10:08 AM) Dialysis Shunt / Fistula   Diagnostic Studies History Lars Mage Spillers, CMA; 05/30/2017 10:08 AM) Colonoscopy  within last year  Allergies (Tanisha A. Owens Shark, [place]; 05/30/2017 9:33  AM) Lisinopril *ANTIHYPERTENSIVES*  Allergies Reconciled   Medication History (Tanisha A. Owens Shark, [place]; 05/30/2017 9:33 AM) Carvedilol (25MG  Tablet, Oral) Active. AmLODIPine Besylate (10MG  Tablet, Oral) Active. Calcium Acetate (Phos Binder) (667MG  Capsule, Oral) Active. Isosorbide Mononitrate ER (60MG  Tablet ER 24HR, Oral) Active. Rena-Vite (Oral) Active. Medications Reconciled  Social History Illene Regulus, CMA; 05/30/2017 10:08 AM) Alcohol use  Remotely quit alcohol use. Caffeine use  Coffee, Tea. No drug use  Tobacco use  Former smoker.  Family History Illene Regulus, [place]; 05/30/2017 10:08 AM) Arthritis  Mother. Cerebrovascular Accident  Mother. Heart Disease  Mother. Hypertension  Mother.  Other Problems Lars Mage Spillers, CMA; 05/30/2017 10:08 AM) Chronic Renal Failure Syndrome  Congestive Heart Failure  High blood pressure     Review of Systems (Alisha Spillers CMA; 05/30/2017 10:08 AM) General Not Present- Appetite Loss, Chills, Fatigue, Fever, Night Sweats, Weight Gain and Weight Loss. Skin Present- Non-Healing Wounds. Not Present- Change in Wart/Mole, Dryness, Hives, Jaundice, New Lesions, Rash and Ulcer. HEENT Not Present- Earache, Hearing Loss, Hoarseness, Nose Bleed, Oral Ulcers, Ringing in the Ears, Seasonal Allergies, Sinus Pain, Sore Throat, Visual Disturbances, Wears glasses/contact lenses and Yellow Eyes. Respiratory Not Present- Bloody sputum, Chronic Cough, Difficulty Breathing, Snoring and Wheezing. Breast Not Present- Breast Mass, Breast Pain, Nipple Discharge and Skin Changes. Cardiovascular Not Present- Chest Pain, Difficulty Breathing Lying Down, Leg Cramps, Palpitations, Rapid Heart Rate, Shortness of Breath and Swelling of Extremities. Gastrointestinal Not Present- Abdominal Pain, Bloating, Bloody Stool, Change in Bowel Habits, Chronic diarrhea, Constipation, Difficulty Swallowing, Excessive gas, Gets full quickly at meals, Hemorrhoids,  Indigestion, Nausea, Rectal Pain and Vomiting. Male Genitourinary Not  Present- Blood in Urine, Change in Urinary Stream, Frequency, Impotence, Nocturia, Painful Urination, Urgency and Urine Leakage. Musculoskeletal Not Present- Back Pain, Joint Pain, Joint Stiffness, Muscle Pain, Muscle Weakness and Swelling of Extremities. Neurological Not Present- Decreased Memory, Fainting, Headaches, Numbness, Seizures, Tingling, Tremor, Trouble walking and Weakness. Psychiatric Not Present- Anxiety, Bipolar, Change in Sleep Pattern, Depression, Fearful and Frequent crying. Endocrine Not Present- Cold Intolerance, Excessive Hunger, Hair Changes, Heat Intolerance, Hot flashes and New Diabetes. Hematology Not Present- Blood Thinners, Easy Bruising, Excessive bleeding, Gland problems, HIV and Persistent Infections.  Vitals (Tanisha A. Brown RMA; 05/30/2017 9:31 AM) 05/30/2017 9:30 AM Weight: 195.6 lb Height: 74in Body Surface Area: 2.15 m Body Mass Index: 25.11 kg/m  Temp.: 97.53F  Pulse: 83 (Regular)  BP: 156/84 (Sitting, Left Arm, Standard)       Physical Exam Adin Hector MD; 05/30/2017 9:53 AM) General Mental Status-Alert. General Appearance-Not in acute distress, Not Sickly. Orientation-Oriented X3. Hydration-Well hydrated. Voice-Normal.  Integumentary Global Assessment Upon inspection and palpation of skin surfaces of the - Axillae: non-tender, no inflammation or ulceration, no drainage. and Distribution of scalp and body hair is normal. General Characteristics Temperature - normal warmth is noted.  Head and Neck Head-normocephalic, atraumatic with no lesions or palpable masses. Face Global Assessment - atraumatic, no absence of expression. Neck Global Assessment - no abnormal movements, no bruit auscultated on the right, no bruit auscultated on the left, no decreased range of motion, non-tender. Trachea-midline. Thyroid Gland Characteristics -  non-tender.  Eye Eyeball - Left-Extraocular movements intact, No Nystagmus. Eyeball - Right-Extraocular movements intact, No Nystagmus. Cornea - Left-No Hazy. Cornea - Right-No Hazy. Sclera/Conjunctiva - Left-No scleral icterus, No Discharge. Sclera/Conjunctiva - Right-No scleral icterus, No Discharge. Pupil - Left-Direct reaction to light normal. Pupil - Right-Direct reaction to light normal.  ENMT Ears Pinna - Left - no drainage observed, no generalized tenderness observed. Right - no drainage observed, no generalized tenderness observed. Nose and Sinuses External Inspection of the Nose - no destructive lesion observed. Inspection of the nares - Left - quiet respiration. Right - quiet respiration. Mouth and Throat Lips - Upper Lip - no fissures observed, no pallor noted. Lower Lip - no fissures observed, no pallor noted. Nasopharynx - no discharge present. Oral Cavity/Oropharynx - Tongue - no dryness observed. Oral Mucosa - no cyanosis observed. Hypopharynx - no evidence of airway distress observed.  Chest and Lung Exam Inspection Movements - Normal and Symmetrical. Accessory muscles - No use of accessory muscles in breathing. Palpation Palpation of the chest reveals - Non-tender. Auscultation Breath sounds - Normal and Clear.  Cardiovascular Auscultation Rhythm - Regular. Murmurs & Other Heart Sounds - Auscultation of the heart reveals - No Murmurs and No Systolic Clicks.  Abdomen Inspection Inspection of the abdomen reveals - No Visible peristalsis and No Abnormal pulsations. Umbilicus - No Bleeding, No Urine drainage. Palpation/Percussion Palpation and Percussion of the abdomen reveal - Soft, Non Tender, No Rebound tenderness, No Rigidity (guarding) and No Cutaneous hyperesthesia. Note: 2 cm umbilical mass reduces down to about a 15 mm umbilical hernia. Diastases. Otherwise the abdomen is soft and flat. Nontender. Not distended. No guarding.   Male  Genitourinary Sexual Maturity Tanner 5 - Adult hair pattern and Adult penile size and shape. Note: Very large right inguinal hernia filling up the scrotum. Highly suspicious for bowel within it. Ultimately reducible. More subtle impulse in left groin. Testes cords and phallus otherwise normal   Peripheral Vascular Upper Extremity Inspection - Left - No  Cyanotic nailbeds, Not Ischemic. Right - No Cyanotic nailbeds, Not Ischemic.  Neurologic Neurologic evaluation reveals -normal attention span and ability to concentrate, able to name objects and repeat phrases. Appropriate fund of knowledge , normal sensation and normal coordination. Mental Status Affect - not angry, not paranoid. Cranial Nerves-Normal Bilaterally. Gait-Normal.  Neuropsychiatric Mental status exam performed with findings of-able to articulate well with normal speech/language, rate, volume and coherence, thought content normal with ability to perform basic computations and apply abstract reasoning and no evidence of hallucinations, delusions, obsessions or homicidal/suicidal ideation.  Musculoskeletal Global Assessment Spine, Ribs and Pelvis - no instability, subluxation or laxity. Right Upper Extremity - no instability, subluxation or laxity. 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  Some chronic skin thickening and changes on bilateral calves. 1 Band-Aid on the left anterior calf. About 1+ edema.   Lymphatic Head & Neck  General Head & Neck Lymphatics: Bilateral - Description - No Localized lymphadenopathy. Axillary  General Axillary Region: Bilateral - Description - No Localized lymphadenopathy. Femoral & Inguinal  Generalized Femoral & Inguinal Lymphatics: Left - Description - No Localized lymphadenopathy. Right - Description - No Localized lymphadenopathy.    Assessment & Plan Adin Hector MD; 05/30/2017 10:22 AM) Carolynn Serve HERNIA (K40.90) Impression: Very large  right inguinal hernia going down to scrotum. Sensitive reducible. Small impulse on left side.  Standard of care would be repair of hernias with mesh. Laparoscopic underlay repair. Most likely use thicker density mesh with 3 sheets and possibly absorbable tacks given the large hernia size. Have to see how big the defect really is intraoperatively. Usually the other side has a hernia and placing contralateral mesh as well to avoid recurrence. He's had some wound issues but no major ulceration or infection. This resolved manner. I recurrence of be unacceptably high without mesh. Much higher risk of having a postoperative hematoma or seroma usually the scrotal ones will improve over several months.  He is wish to be aggressive and get this fixed. We will work to do an off dialysis day. Usually gets dialysis Tuesday/Thursday/Saturday  Hopefully the umbilical hernia small (just primarily repair. UMBILICAL HERNIA WITHOUT OBSTRUCTION OR GANGRENE (K42.9) PREOP - ING HERNIA - ENCOUNTER FOR PREOPERATIVE EXAMINATION FOR GENERAL SURGICAL PROCEDURE (Z01.818) Current Plans You are being scheduled for surgery- Our schedulers will call you.  You should hear from our office's scheduling department within 5 working days about the location, date, and time of surgery. We try to make accommodations for patient's preferences in scheduling surgery, but sometimes the OR schedule or the surgeon's schedule prevents [place] from making those accommodations.  If you have not heard from our office [phone]) in 5 working days, call the office and ask for your surgeon's nurse.  If you have other questions about your diagnosis, plan, or surgery, call the office and ask for your surgeon's nurse.  Written instructions provided The anatomy & physiology of the abdominal wall and pelvic floor was discussed. The pathophysiology of hernias in the inguinal and pelvic region was discussed. Natural history risks such as progressive  enlargement, pain, incarceration, and strangulation was discussed. Contributors to complications such as smoking, obesity, diabetes, prior surgery, etc were discussed.  I feel the risks of no intervention will lead to serious problems that outweigh the operative risks; therefore, I recommended surgery to reduce and repair the hernia. I explained laparoscopic techniques with possible need for an open approach. I noted usual use of mesh to patch and/or buttress hernia repair  Risks such as bleeding, infection, abscess, need for further treatment, heart attack, death, and other risks were discussed. I noted a good likelihood this will help address the problem. Goals of post-operative recovery were discussed as well. Possibility that this will not correct all symptoms was explained. I stressed the importance of low-impact activity, aggressive pain control, avoiding constipation, & not pushing through pain to minimize risk of post-operative chronic pain or injury. Possibility of reherniation was discussed. We will work to minimize complications.  An educational handout further explaining the pathology & treatment options was given as well. Questions were answered. The patient expresses understanding & wishes to proceed with surgery.  Pt Education - Pamphlet Given - Laparoscopic Hernia Repair: discussed with patient and provided information. Pt Education - CCS Pain Control (Suzy Kugel) Pt Education - CCS Hernia Post-Op HCI (Tao Satz): discussed with patient and provided information. Pt Education - CCS Mesh education: discussed with patient and provided information. ESRD (END STAGE RENAL DISEASE) ON DIALYSIS (N18.6) Impression: Plan to do surgery on an off- dialysis day. (Usually gets dialysis Tuesday/Thursday/Saturday)  : Cardiac clearance done.  Plan to do on off-dialysis today.  Adin Hector, MD, FACS, MASCRS Gastrointestinal and Minimally Invasive Surgery    [address], [place], [place] [address] [phone] Main / Paging [phone] Fax

## 2017-09-07 NOTE — Patient Instructions (Signed)
Medication Instructions:  NO CHANGES- Your physician recommends that you continue on your current medications as directed. Please refer to the Current Medication list given to you today.  If you need a refill on your cardiac medications before your next appointment, please call your pharmacy.  Follow-Up: Your physician wants you to follow-up in: [place].   Thank you for choosing [facility] at [facility]!!

## 2017-09-08 DIAGNOSIS — D509 Iron deficiency anemia, unspecified: Secondary | ICD-10-CM | POA: Diagnosis not present

## 2017-09-08 DIAGNOSIS — N2581 Secondary hyperparathyroidism of renal origin: Secondary | ICD-10-CM | POA: Diagnosis not present

## 2017-09-08 DIAGNOSIS — Z992 Dependence on renal dialysis: Secondary | ICD-10-CM | POA: Diagnosis not present

## 2017-09-08 DIAGNOSIS — N186 End stage renal disease: Secondary | ICD-10-CM | POA: Diagnosis not present

## 2017-09-11 DIAGNOSIS — Z992 Dependence on renal dialysis: Secondary | ICD-10-CM | POA: Diagnosis not present

## 2017-09-11 DIAGNOSIS — I129 Hypertensive chronic kidney disease with stage 1 through stage 4 chronic kidney disease, or unspecified chronic kidney disease: Secondary | ICD-10-CM | POA: Diagnosis not present

## 2017-09-11 DIAGNOSIS — N186 End stage renal disease: Secondary | ICD-10-CM | POA: Diagnosis not present

## 2017-09-13 DIAGNOSIS — N186 End stage renal disease: Secondary | ICD-10-CM | POA: Diagnosis not present

## 2017-09-13 DIAGNOSIS — D509 Iron deficiency anemia, unspecified: Secondary | ICD-10-CM | POA: Diagnosis not present

## 2017-09-13 DIAGNOSIS — N2581 Secondary hyperparathyroidism of renal origin: Secondary | ICD-10-CM | POA: Diagnosis not present

## 2017-09-13 DIAGNOSIS — Z992 Dependence on renal dialysis: Secondary | ICD-10-CM | POA: Diagnosis not present

## 2017-09-15 DIAGNOSIS — N186 End stage renal disease: Secondary | ICD-10-CM | POA: Diagnosis not present

## 2017-09-15 DIAGNOSIS — N2581 Secondary hyperparathyroidism of renal origin: Secondary | ICD-10-CM | POA: Diagnosis not present

## 2017-09-15 DIAGNOSIS — D509 Iron deficiency anemia, unspecified: Secondary | ICD-10-CM | POA: Diagnosis not present

## 2017-09-15 DIAGNOSIS — Z992 Dependence on renal dialysis: Secondary | ICD-10-CM | POA: Diagnosis not present

## 2017-09-16 ENCOUNTER — Telehealth: Payer: Self-pay

## 2017-09-16 NOTE — Telephone Encounter (Signed)
Patient was calling back in regards into paperwork. Left a vm for patient to callback

## 2017-09-17 DIAGNOSIS — D509 Iron deficiency anemia, unspecified: Secondary | ICD-10-CM | POA: Diagnosis not present

## 2017-09-17 DIAGNOSIS — N2581 Secondary hyperparathyroidism of renal origin: Secondary | ICD-10-CM | POA: Diagnosis not present

## 2017-09-17 DIAGNOSIS — Z992 Dependence on renal dialysis: Secondary | ICD-10-CM | POA: Diagnosis not present

## 2017-09-17 DIAGNOSIS — N186 End stage renal disease: Secondary | ICD-10-CM | POA: Diagnosis not present

## 2017-09-20 DIAGNOSIS — N2581 Secondary hyperparathyroidism of renal origin: Secondary | ICD-10-CM | POA: Diagnosis not present

## 2017-09-20 DIAGNOSIS — N186 End stage renal disease: Secondary | ICD-10-CM | POA: Diagnosis not present

## 2017-09-20 DIAGNOSIS — Z992 Dependence on renal dialysis: Secondary | ICD-10-CM | POA: Diagnosis not present

## 2017-09-20 DIAGNOSIS — D509 Iron deficiency anemia, unspecified: Secondary | ICD-10-CM | POA: Diagnosis not present

## 2017-09-21 DIAGNOSIS — N2581 Secondary hyperparathyroidism of renal origin: Secondary | ICD-10-CM | POA: Diagnosis not present

## 2017-09-21 DIAGNOSIS — D509 Iron deficiency anemia, unspecified: Secondary | ICD-10-CM | POA: Diagnosis not present

## 2017-09-21 DIAGNOSIS — Z992 Dependence on renal dialysis: Secondary | ICD-10-CM | POA: Diagnosis not present

## 2017-09-21 DIAGNOSIS — N186 End stage renal disease: Secondary | ICD-10-CM | POA: Diagnosis not present

## 2017-09-24 DIAGNOSIS — D509 Iron deficiency anemia, unspecified: Secondary | ICD-10-CM | POA: Diagnosis not present

## 2017-09-24 DIAGNOSIS — N186 End stage renal disease: Secondary | ICD-10-CM | POA: Diagnosis not present

## 2017-09-24 DIAGNOSIS — Z992 Dependence on renal dialysis: Secondary | ICD-10-CM | POA: Diagnosis not present

## 2017-09-24 DIAGNOSIS — N2581 Secondary hyperparathyroidism of renal origin: Secondary | ICD-10-CM | POA: Diagnosis not present

## 2017-09-27 MED ORDER — BUPIVACAINE LIPOSOME 1.3 % IJ SUSP
20.0000 mL | Freq: Once | INTRAMUSCULAR | Status: DC
  Filled 2017-09-27: qty 20

## 2017-09-28 ENCOUNTER — Encounter (HOSPITAL_COMMUNITY): Payer: Self-pay | Admitting: *Deleted

## 2017-09-28 ENCOUNTER — Ambulatory Visit (HOSPITAL_COMMUNITY): Payer: Medicare Other | Admitting: Anesthesiology

## 2017-09-28 ENCOUNTER — Encounter (HOSPITAL_COMMUNITY)
Admission: RE | Disposition: A | Discharge status: Home / Self Care | Payer: Self-pay | Source: Ambulatory Visit | Attending: Surgery

## 2017-09-28 ENCOUNTER — Ambulatory Visit (HOSPITAL_COMMUNITY)
Admission: RE | Admit: 2017-09-28 | Discharge: 2017-09-28 | Disposition: A | Discharge status: Home / Self Care | Payer: Medicare Other | Source: Ambulatory Visit | Attending: Surgery | Admitting: Surgery

## 2017-09-28 DIAGNOSIS — I272 Pulmonary hypertension, unspecified: Secondary | ICD-10-CM | POA: Insufficient documentation

## 2017-09-28 DIAGNOSIS — Z8261 Family history of arthritis: Secondary | ICD-10-CM | POA: Insufficient documentation

## 2017-09-28 DIAGNOSIS — Z8249 Family history of ischemic heart disease and other diseases of the circulatory system: Secondary | ICD-10-CM | POA: Diagnosis not present

## 2017-09-28 DIAGNOSIS — J90 Pleural effusion, not elsewhere classified: Secondary | ICD-10-CM | POA: Insufficient documentation

## 2017-09-28 DIAGNOSIS — K469 Unspecified abdominal hernia without obstruction or gangrene: Secondary | ICD-10-CM | POA: Diagnosis present

## 2017-09-28 DIAGNOSIS — Z5309 Procedure and treatment not carried out because of other contraindication: Secondary | ICD-10-CM | POA: Insufficient documentation

## 2017-09-28 DIAGNOSIS — D631 Anemia in chronic kidney disease: Secondary | ICD-10-CM | POA: Diagnosis not present

## 2017-09-28 DIAGNOSIS — Z87891 Personal history of nicotine dependence: Secondary | ICD-10-CM | POA: Insufficient documentation

## 2017-09-28 DIAGNOSIS — K4090 Unilateral inguinal hernia, without obstruction or gangrene, not specified as recurrent: Secondary | ICD-10-CM | POA: Diagnosis not present

## 2017-09-28 DIAGNOSIS — I509 Heart failure, unspecified: Secondary | ICD-10-CM | POA: Diagnosis not present

## 2017-09-28 DIAGNOSIS — N186 End stage renal disease: Secondary | ICD-10-CM | POA: Diagnosis not present

## 2017-09-28 DIAGNOSIS — I132 Hypertensive heart and chronic kidney disease with heart failure and with stage 5 chronic kidney disease, or end stage renal disease: Secondary | ICD-10-CM | POA: Insufficient documentation

## 2017-09-28 DIAGNOSIS — I472 Ventricular tachycardia: Secondary | ICD-10-CM | POA: Insufficient documentation

## 2017-09-28 DIAGNOSIS — Z823 Family history of stroke: Secondary | ICD-10-CM | POA: Diagnosis not present

## 2017-09-28 DIAGNOSIS — I872 Venous insufficiency (chronic) (peripheral): Secondary | ICD-10-CM | POA: Insufficient documentation

## 2017-09-28 DIAGNOSIS — Z992 Dependence on renal dialysis: Secondary | ICD-10-CM | POA: Diagnosis not present

## 2017-09-28 DIAGNOSIS — I739 Peripheral vascular disease, unspecified: Secondary | ICD-10-CM | POA: Diagnosis not present

## 2017-09-28 DIAGNOSIS — Z888 Allergy status to other drugs, medicaments and biological substances status: Secondary | ICD-10-CM | POA: Insufficient documentation

## 2017-09-28 LAB — POCT I-STAT 4, (NA,K, GLUC, HGB,HCT)
Glucose, Bld: 99 mg/dL (ref 70–99)
HCT: 31 % — ABNORMAL LOW (ref 39.0–52.0)
HEMOGLOBIN: 10.5 g/dL — AB (ref 13.0–17.0)
Potassium: 3.5 mmol/L (ref 3.5–5.1)
SODIUM: 138 mmol/L (ref 135–145)

## 2017-09-28 SURGERY — REPAIR, HERNIA, INGUINAL, BILATERAL, LAPAROSCOPIC
Anesthesia: General

## 2017-09-28 MED ORDER — ACETAMINOPHEN 500 MG PO TABS
1000.0000 mg | ORAL_TABLET | ORAL | Status: AC
  Administered 2017-09-28: 1000 mg via ORAL
  Filled 2017-09-28: qty 2

## 2017-09-28 MED ORDER — SODIUM CHLORIDE 0.9 % IV SOLN
INTRAVENOUS | Status: DC
  Administered 2017-09-28: 10:00:00 via INTRAVENOUS

## 2017-09-28 MED ORDER — CHLORHEXIDINE GLUCONATE CLOTH 2 % EX PADS: 6.0000 | MEDICATED_PAD | Freq: Once | CUTANEOUS | Status: DC

## 2017-09-28 MED ORDER — CEFAZOLIN SODIUM-DEXTROSE 2-4 GM/100ML-% IV SOLN
2.0000 g | INTRAVENOUS | Status: DC
  Filled 2017-09-28: qty 100

## 2017-09-28 MED ORDER — GABAPENTIN 300 MG PO CAPS
300.0000 mg | ORAL_CAPSULE | ORAL | Status: AC
  Administered 2017-09-28: 300 mg via ORAL
  Filled 2017-09-28: qty 1

## 2017-09-28 MED ORDER — LACTATED RINGERS IV SOLN: INTRAVENOUS | Status: DC

## 2017-09-28 NOTE — Anesthesia Preprocedure Evaluation (Deleted)
Anesthesia Evaluation  Patient identified by MRN, date of birth, ID band  Reviewed: Allergy & Precautions, NPO status , Patient's Chart, lab work & pertinent test results  Airway Mallampati: II  TM Distance: >3 FB     Dental   Pulmonary pneumonia, former smoker,    breath sounds clear to auscultation       Cardiovascular hypertension, + Peripheral Vascular Disease and +CHF   Rhythm:Regular Rate:Normal     Neuro/Psych    GI/Hepatic History noted. CG   Endo/Other    Renal/GU Renal disease     Musculoskeletal   Abdominal   Peds  Hematology  (+) anemia ,   Anesthesia Other Findings   Reproductive/Obstetrics                             Anesthesia Physical Anesthesia Plan  ASA: III  Anesthesia Plan: General   Post-op Pain Management:    Induction: Intravenous  PONV Risk Score and Plan: Treatment may vary due to age or medical condition, Ondansetron, Dexamethasone and Midazolam  Airway Management Planned: Oral ETT  Additional Equipment:   Intra-op Plan:   Post-operative Plan: Possible Post-op intubation/ventilation  Informed Consent: I have reviewed the patients History and Physical, chart, labs and discussed the procedure including the risks, benefits and alternatives for the proposed anesthesia with the patient or authorized representative who has indicated his/her understanding and acceptance.   Dental advisory given  Plan Discussed with: CRNA and Anesthesiologist  Anesthesia Plan Comments:         Anesthesia Quick Evaluation

## 2017-09-28 NOTE — Progress Notes (Signed)
Jason [name].  1960/04/30 [id number]  Patient Care Team: [name], FNP as PCP - General (Family Medicine) [name] [name], MD as PCP - Cardiology (Cardiology) Center, [facility] [name], [name], MD as Consulting Physician (Cardiology) [name], MD as Consulting Physician (Nephrology) [name], MD as Consulting Physician (Nephrology)  This patient is a 57 y.o.male   Patient due for elective hernia repair today.  Had to be rescheduled for cardiac clearance.  However, patient has not had dialysis for 4 days.  Skipped his dialysis treatment yesterday.  Coming in hypertensive.  Concern for fluid overload.  Dr. [name] with anesthesia concerned about placing the patient under anesthesia for surgery and wished to postpone surgery.  Patient notified.  I strongly recommend that the patient reach out to his dialysis center and get dialyzed as soon as possible.  Dr. [name] to reach out to his nephrologist as well.  Patient Active Problem List   Diagnosis Date Noted  . ESRD (end stage renal disease) ([place])     Priority: High  . Scrotal hernia, right 05/30/2017  . Arteriovenous fistula (Right arm) for dialysis 05/30/2017  . Umbilical hernia 98/33/8250  . Anasarca 02/18/2017  . NSVT (nonsustained ventricular tachycardia) ([place])   . Evaluation by psychiatric service required 01/19/2017  . Elevated troponin 01/16/2017  . Acute CHF (congestive heart failure) ([place]) 01/16/2017  . Venous stasis ulcer ([place]) 01/16/2017  . Essential hypertension 02/26/2015  . Abnormal EKG 01/30/2015  . Anemia 01/29/2015    Past Medical History:  Diagnosis Date  . A-V fistula (HCC)    Right arm  . Abdominal hernia   . AKI (acute kidney injury) ([employer]) 01/2015  . Anasarca   . Anemia   . Bilateral inguinal hernia   . Bilateral pleural effusion 01/2017  . Cellulitis 01/16/2017   Bilateral lower extremity  . CHF (congestive heart failure) ([place])   . Chronic venous  insufficiency   . Concentric left ventricular hypertrophy 08/17/2017   Moderated, noted on ECHO  . Diastolic dysfunction 53/97/6734   noted on ECHO  . Elevated LFTs    per patient, resolved  . End stage renal disease [facility])    Dialysis T/Th/Sa  Started 02/2017  . Hypertension   . Pneumonia   . Pulmonary hypertension (HCC)    Moderate  . Venous stasis ulcer ([place])     Past Surgical History:  Procedure Laterality Date  . AV FISTULA PLACEMENT Right 01/20/2017   Procedure: ARTERIOVENOUS (AV) FISTULA CREATION;  Surgeon: Angelia Mould, MD;  Location: [facility];  Service: Vascular;  Laterality: Right;  . INSERTION OF DIALYSIS CATHETER Right 01/20/2017   Procedure: INSERTION OF DIALYSIS CATHETER;  Surgeon: Angelia Mould, MD;  Location: [facility];  Service: Vascular;  Laterality: Right;  . IR FLUORO GUIDE CV LINE RIGHT  01/18/2017  . IR US GUIDE VASC ACCESS RIGHT  01/18/2017  . TOE SURGERY    . TONSILLECTOMY      Social History   Socioeconomic History  . Marital status: Single    Spouse name: Not on file  . Number of children: Not on file  . Years of education: Not on file  . Highest education level: Not on file  Occupational History  . Not on file  Social Needs  . Financial resource strain: Not on file  . Food insecurity:    Worry: Not on file    Inability: Not on file  . Transportation needs:    Medical: Not  on file    Non-medical: Not on file  Tobacco Use  . Smoking status: Former Research scientist (life sciences)  . Smokeless tobacco: Never Used  . Tobacco comment: quit smoking in 1997  Substance and Sexual Activity  . Alcohol use: Yes    Comment: social  . Drug use: Not Currently    Comment: marijuana  . Sexual activity: Not on file  Lifestyle  . Physical activity:    Days per week: Not on file    Minutes per session: Not on file  . Stress: Not on file  Relationships  . Social connections:    Talks on phone: Not on file    Gets together: Not on file    Attends religious service:  Not on file    Active member of club or organization: Not on file    Attends meetings of clubs or organizations: Not on file    Relationship status: Not on file  . Intimate partner violence:    Fear of current or ex partner: Not on file    Emotionally abused: Not on file    Physically abused: Not on file    Forced sexual activity: Not on file  Other Topics Concern  . Not on file  Social History Narrative  . Not on file    Family History  Problem Relation Age of Onset  . Heart attack Mother 89       CABG hx  . Heart disease Mother   . Hypertension Mother   . Healthy Father   . Healthy Brother     Current Facility-Administered Medications  Medication Dose Route Frequency Provider Last Rate Last Dose  . 0.9 %  sodium chloride infusion   Intravenous Continuous Belinda Block, MD 75 mL/hr at 09/28/17 1028    . bupivacaine liposome (EXPAREL) 1.3 % injection 266 mg  20 mL Infiltration Once Napoleon Form, RPH      . ceFAZolin (ANCEF) IVPB 2g/100 mL premix  2 g Intravenous On Call to OR Michael Boston, MD      . Chlorhexidine Gluconate Cloth 2 % PADS 6 each  6 each Topical Once Michael Boston, MD       And  . Chlorhexidine Gluconate Cloth 2 % PADS 6 each  6 each Topical Once Michael Boston, MD         Allergies  Allergen Reactions  . Lisinopril Cough    BP (!) 182/98   Pulse (!) 50   Temp 98.6 F (37 C) (Oral)   Resp 14   Ht 6\' 2"  (1.88 m)   Wt 81.6 kg   SpO2 100%   BMI 23.11 kg/m   No results found.  Note: This dictation was prepared with Dragon/digital dictation along with [employer]. Any transcriptional errors that result from this process are unintentional.   .Adin Hector, M.D., F.A.C.S. Gastrointestinal and Minimally Invasive Surgery [facility] [address], [place], [place] [address] [phone] Main / Paging  09/28/2017 12:29 PM

## 2017-09-28 NOTE — Progress Notes (Signed)
Patient needs to make sure he has dialysis the day before or day of his surgery going forward.

## 2017-09-29 DIAGNOSIS — N186 End stage renal disease: Secondary | ICD-10-CM | POA: Diagnosis not present

## 2017-09-29 DIAGNOSIS — D509 Iron deficiency anemia, unspecified: Secondary | ICD-10-CM | POA: Diagnosis not present

## 2017-09-29 DIAGNOSIS — Z992 Dependence on renal dialysis: Secondary | ICD-10-CM | POA: Diagnosis not present

## 2017-09-29 DIAGNOSIS — N2581 Secondary hyperparathyroidism of renal origin: Secondary | ICD-10-CM | POA: Diagnosis not present

## 2017-10-01 DIAGNOSIS — Z992 Dependence on renal dialysis: Secondary | ICD-10-CM | POA: Diagnosis not present

## 2017-10-01 DIAGNOSIS — N2581 Secondary hyperparathyroidism of renal origin: Secondary | ICD-10-CM | POA: Diagnosis not present

## 2017-10-01 DIAGNOSIS — N186 End stage renal disease: Secondary | ICD-10-CM | POA: Diagnosis not present

## 2017-10-01 DIAGNOSIS — D509 Iron deficiency anemia, unspecified: Secondary | ICD-10-CM | POA: Diagnosis not present

## 2017-10-03 ENCOUNTER — Telehealth: Payer: Self-pay

## 2017-10-03 MED ORDER — CALCIUM ACETATE (PHOS BINDER) 667 MG PO CAPS: 667.0000 mg | ORAL_CAPSULE | Freq: Three times a day (TID) | ORAL | 1 refills | Status: DC

## 2017-10-03 MED ORDER — ISOSORBIDE MONONITRATE ER 60 MG PO TB24: 60.0000 mg | ORAL_TABLET | Freq: Every day | ORAL | 1 refills | Status: DC

## 2017-10-03 MED ORDER — CARVEDILOL 25 MG PO TABS: 25.0000 mg | ORAL_TABLET | Freq: Two times a day (BID) | ORAL | 2 refills | Status: DC

## 2017-10-03 MED ORDER — AMLODIPINE BESYLATE 10 MG PO TABS: 10.0000 mg | ORAL_TABLET | Freq: Every day | ORAL | 1 refills | Status: DC

## 2017-10-03 NOTE — Telephone Encounter (Signed)
Patient was advise to keep his dialysis appointment as scheduled and to follow up with his surgeon for the hernia surgery. Patient agrees and will keep appointment and call his doctor office.

## 2017-10-03 NOTE — Telephone Encounter (Signed)
Medication sent to pharmacy  

## 2017-10-04 DIAGNOSIS — N2581 Secondary hyperparathyroidism of renal origin: Secondary | ICD-10-CM | POA: Diagnosis not present

## 2017-10-04 DIAGNOSIS — N186 End stage renal disease: Secondary | ICD-10-CM | POA: Diagnosis not present

## 2017-10-04 DIAGNOSIS — D509 Iron deficiency anemia, unspecified: Secondary | ICD-10-CM | POA: Diagnosis not present

## 2017-10-04 DIAGNOSIS — Z992 Dependence on renal dialysis: Secondary | ICD-10-CM | POA: Diagnosis not present

## 2017-10-06 DIAGNOSIS — N186 End stage renal disease: Secondary | ICD-10-CM | POA: Diagnosis not present

## 2017-10-06 DIAGNOSIS — N2581 Secondary hyperparathyroidism of renal origin: Secondary | ICD-10-CM | POA: Diagnosis not present

## 2017-10-06 DIAGNOSIS — D509 Iron deficiency anemia, unspecified: Secondary | ICD-10-CM | POA: Diagnosis not present

## 2017-10-06 DIAGNOSIS — Z992 Dependence on renal dialysis: Secondary | ICD-10-CM | POA: Diagnosis not present

## 2017-10-08 DIAGNOSIS — D509 Iron deficiency anemia, unspecified: Secondary | ICD-10-CM | POA: Diagnosis not present

## 2017-10-08 DIAGNOSIS — Z992 Dependence on renal dialysis: Secondary | ICD-10-CM | POA: Diagnosis not present

## 2017-10-08 DIAGNOSIS — N2581 Secondary hyperparathyroidism of renal origin: Secondary | ICD-10-CM | POA: Diagnosis not present

## 2017-10-08 DIAGNOSIS — N186 End stage renal disease: Secondary | ICD-10-CM | POA: Diagnosis not present

## 2017-10-11 DIAGNOSIS — N186 End stage renal disease: Secondary | ICD-10-CM | POA: Diagnosis not present

## 2017-10-11 DIAGNOSIS — Z23 Encounter for immunization: Secondary | ICD-10-CM | POA: Diagnosis not present

## 2017-10-11 DIAGNOSIS — Z992 Dependence on renal dialysis: Secondary | ICD-10-CM | POA: Diagnosis not present

## 2017-10-11 DIAGNOSIS — Z87898 Personal history of other specified conditions: Secondary | ICD-10-CM

## 2017-10-11 DIAGNOSIS — N2581 Secondary hyperparathyroidism of renal origin: Secondary | ICD-10-CM | POA: Diagnosis not present

## 2017-10-11 DIAGNOSIS — I129 Hypertensive chronic kidney disease with stage 1 through stage 4 chronic kidney disease, or unspecified chronic kidney disease: Secondary | ICD-10-CM | POA: Diagnosis not present

## 2017-10-11 DIAGNOSIS — D631 Anemia in chronic kidney disease: Secondary | ICD-10-CM | POA: Diagnosis not present

## 2017-10-11 DIAGNOSIS — D509 Iron deficiency anemia, unspecified: Secondary | ICD-10-CM | POA: Diagnosis not present

## 2017-10-11 HISTORY — DX: Personal history of other specified conditions: Z87.898

## 2017-10-13 DIAGNOSIS — N186 End stage renal disease: Secondary | ICD-10-CM | POA: Diagnosis not present

## 2017-10-13 DIAGNOSIS — Z992 Dependence on renal dialysis: Secondary | ICD-10-CM | POA: Diagnosis not present

## 2017-10-13 DIAGNOSIS — D631 Anemia in chronic kidney disease: Secondary | ICD-10-CM | POA: Diagnosis not present

## 2017-10-13 DIAGNOSIS — D509 Iron deficiency anemia, unspecified: Secondary | ICD-10-CM | POA: Diagnosis not present

## 2017-10-13 DIAGNOSIS — Z23 Encounter for immunization: Secondary | ICD-10-CM | POA: Diagnosis not present

## 2017-10-13 DIAGNOSIS — N2581 Secondary hyperparathyroidism of renal origin: Secondary | ICD-10-CM | POA: Diagnosis not present

## 2017-10-15 DIAGNOSIS — D631 Anemia in chronic kidney disease: Secondary | ICD-10-CM | POA: Diagnosis not present

## 2017-10-15 DIAGNOSIS — N2581 Secondary hyperparathyroidism of renal origin: Secondary | ICD-10-CM | POA: Diagnosis not present

## 2017-10-15 DIAGNOSIS — Z23 Encounter for immunization: Secondary | ICD-10-CM | POA: Diagnosis not present

## 2017-10-15 DIAGNOSIS — N186 End stage renal disease: Secondary | ICD-10-CM | POA: Diagnosis not present

## 2017-10-15 DIAGNOSIS — D509 Iron deficiency anemia, unspecified: Secondary | ICD-10-CM | POA: Diagnosis not present

## 2017-10-15 DIAGNOSIS — Z992 Dependence on renal dialysis: Secondary | ICD-10-CM | POA: Diagnosis not present

## 2017-10-18 DIAGNOSIS — N186 End stage renal disease: Secondary | ICD-10-CM | POA: Diagnosis not present

## 2017-10-18 DIAGNOSIS — Z23 Encounter for immunization: Secondary | ICD-10-CM | POA: Diagnosis not present

## 2017-10-18 DIAGNOSIS — D509 Iron deficiency anemia, unspecified: Secondary | ICD-10-CM | POA: Diagnosis not present

## 2017-10-18 DIAGNOSIS — D631 Anemia in chronic kidney disease: Secondary | ICD-10-CM | POA: Diagnosis not present

## 2017-10-18 DIAGNOSIS — N2581 Secondary hyperparathyroidism of renal origin: Secondary | ICD-10-CM | POA: Diagnosis not present

## 2017-10-18 DIAGNOSIS — Z992 Dependence on renal dialysis: Secondary | ICD-10-CM | POA: Diagnosis not present

## 2017-10-20 DIAGNOSIS — Z992 Dependence on renal dialysis: Secondary | ICD-10-CM | POA: Diagnosis not present

## 2017-10-20 DIAGNOSIS — D509 Iron deficiency anemia, unspecified: Secondary | ICD-10-CM | POA: Diagnosis not present

## 2017-10-20 DIAGNOSIS — N186 End stage renal disease: Secondary | ICD-10-CM | POA: Diagnosis not present

## 2017-10-20 DIAGNOSIS — N2581 Secondary hyperparathyroidism of renal origin: Secondary | ICD-10-CM | POA: Diagnosis not present

## 2017-10-20 DIAGNOSIS — Z23 Encounter for immunization: Secondary | ICD-10-CM | POA: Diagnosis not present

## 2017-10-20 DIAGNOSIS — D631 Anemia in chronic kidney disease: Secondary | ICD-10-CM | POA: Diagnosis not present

## 2017-10-22 DIAGNOSIS — Z992 Dependence on renal dialysis: Secondary | ICD-10-CM | POA: Diagnosis not present

## 2017-10-22 DIAGNOSIS — D509 Iron deficiency anemia, unspecified: Secondary | ICD-10-CM | POA: Diagnosis not present

## 2017-10-22 DIAGNOSIS — N2581 Secondary hyperparathyroidism of renal origin: Secondary | ICD-10-CM | POA: Diagnosis not present

## 2017-10-22 DIAGNOSIS — Z23 Encounter for immunization: Secondary | ICD-10-CM | POA: Diagnosis not present

## 2017-10-22 DIAGNOSIS — D631 Anemia in chronic kidney disease: Secondary | ICD-10-CM | POA: Diagnosis not present

## 2017-10-22 DIAGNOSIS — N186 End stage renal disease: Secondary | ICD-10-CM | POA: Diagnosis not present

## 2017-10-25 DIAGNOSIS — Z23 Encounter for immunization: Secondary | ICD-10-CM | POA: Diagnosis not present

## 2017-10-25 DIAGNOSIS — N186 End stage renal disease: Secondary | ICD-10-CM | POA: Diagnosis not present

## 2017-10-25 DIAGNOSIS — N2581 Secondary hyperparathyroidism of renal origin: Secondary | ICD-10-CM | POA: Diagnosis not present

## 2017-10-25 DIAGNOSIS — D631 Anemia in chronic kidney disease: Secondary | ICD-10-CM | POA: Diagnosis not present

## 2017-10-25 DIAGNOSIS — D509 Iron deficiency anemia, unspecified: Secondary | ICD-10-CM | POA: Diagnosis not present

## 2017-10-25 DIAGNOSIS — Z992 Dependence on renal dialysis: Secondary | ICD-10-CM | POA: Diagnosis not present

## 2017-10-27 DIAGNOSIS — D509 Iron deficiency anemia, unspecified: Secondary | ICD-10-CM | POA: Diagnosis not present

## 2017-10-27 DIAGNOSIS — Z992 Dependence on renal dialysis: Secondary | ICD-10-CM | POA: Diagnosis not present

## 2017-10-27 DIAGNOSIS — Z23 Encounter for immunization: Secondary | ICD-10-CM | POA: Diagnosis not present

## 2017-10-27 DIAGNOSIS — N186 End stage renal disease: Secondary | ICD-10-CM | POA: Diagnosis not present

## 2017-10-27 DIAGNOSIS — D631 Anemia in chronic kidney disease: Secondary | ICD-10-CM | POA: Diagnosis not present

## 2017-10-27 DIAGNOSIS — N2581 Secondary hyperparathyroidism of renal origin: Secondary | ICD-10-CM | POA: Diagnosis not present

## 2017-10-29 DIAGNOSIS — N186 End stage renal disease: Secondary | ICD-10-CM | POA: Diagnosis not present

## 2017-10-29 DIAGNOSIS — D509 Iron deficiency anemia, unspecified: Secondary | ICD-10-CM | POA: Diagnosis not present

## 2017-10-29 DIAGNOSIS — N2581 Secondary hyperparathyroidism of renal origin: Secondary | ICD-10-CM | POA: Diagnosis not present

## 2017-10-29 DIAGNOSIS — Z23 Encounter for immunization: Secondary | ICD-10-CM | POA: Diagnosis not present

## 2017-10-29 DIAGNOSIS — D631 Anemia in chronic kidney disease: Secondary | ICD-10-CM | POA: Diagnosis not present

## 2017-10-29 DIAGNOSIS — Z992 Dependence on renal dialysis: Secondary | ICD-10-CM | POA: Diagnosis not present

## 2017-11-01 DIAGNOSIS — D631 Anemia in chronic kidney disease: Secondary | ICD-10-CM | POA: Diagnosis not present

## 2017-11-01 DIAGNOSIS — Z23 Encounter for immunization: Secondary | ICD-10-CM | POA: Diagnosis not present

## 2017-11-01 DIAGNOSIS — N186 End stage renal disease: Secondary | ICD-10-CM | POA: Diagnosis not present

## 2017-11-01 DIAGNOSIS — D509 Iron deficiency anemia, unspecified: Secondary | ICD-10-CM | POA: Diagnosis not present

## 2017-11-01 DIAGNOSIS — Z992 Dependence on renal dialysis: Secondary | ICD-10-CM | POA: Diagnosis not present

## 2017-11-01 DIAGNOSIS — N2581 Secondary hyperparathyroidism of renal origin: Secondary | ICD-10-CM | POA: Diagnosis not present

## 2017-11-03 DIAGNOSIS — D509 Iron deficiency anemia, unspecified: Secondary | ICD-10-CM | POA: Diagnosis not present

## 2017-11-03 DIAGNOSIS — N186 End stage renal disease: Secondary | ICD-10-CM | POA: Diagnosis not present

## 2017-11-03 DIAGNOSIS — D631 Anemia in chronic kidney disease: Secondary | ICD-10-CM | POA: Diagnosis not present

## 2017-11-03 DIAGNOSIS — Z992 Dependence on renal dialysis: Secondary | ICD-10-CM | POA: Diagnosis not present

## 2017-11-03 DIAGNOSIS — N2581 Secondary hyperparathyroidism of renal origin: Secondary | ICD-10-CM | POA: Diagnosis not present

## 2017-11-03 DIAGNOSIS — Z23 Encounter for immunization: Secondary | ICD-10-CM | POA: Diagnosis not present

## 2017-11-05 DIAGNOSIS — N186 End stage renal disease: Secondary | ICD-10-CM | POA: Diagnosis not present

## 2017-11-05 DIAGNOSIS — D631 Anemia in chronic kidney disease: Secondary | ICD-10-CM | POA: Diagnosis not present

## 2017-11-05 DIAGNOSIS — N2581 Secondary hyperparathyroidism of renal origin: Secondary | ICD-10-CM | POA: Diagnosis not present

## 2017-11-05 DIAGNOSIS — D509 Iron deficiency anemia, unspecified: Secondary | ICD-10-CM | POA: Diagnosis not present

## 2017-11-05 DIAGNOSIS — Z23 Encounter for immunization: Secondary | ICD-10-CM | POA: Diagnosis not present

## 2017-11-05 DIAGNOSIS — Z992 Dependence on renal dialysis: Secondary | ICD-10-CM | POA: Diagnosis not present

## 2017-11-08 DIAGNOSIS — D509 Iron deficiency anemia, unspecified: Secondary | ICD-10-CM | POA: Diagnosis not present

## 2017-11-08 DIAGNOSIS — N2581 Secondary hyperparathyroidism of renal origin: Secondary | ICD-10-CM | POA: Diagnosis not present

## 2017-11-08 DIAGNOSIS — Z23 Encounter for immunization: Secondary | ICD-10-CM | POA: Diagnosis not present

## 2017-11-08 DIAGNOSIS — Z992 Dependence on renal dialysis: Secondary | ICD-10-CM | POA: Diagnosis not present

## 2017-11-08 DIAGNOSIS — N186 End stage renal disease: Secondary | ICD-10-CM | POA: Diagnosis not present

## 2017-11-08 DIAGNOSIS — D631 Anemia in chronic kidney disease: Secondary | ICD-10-CM | POA: Diagnosis not present

## 2017-11-10 DIAGNOSIS — Z992 Dependence on renal dialysis: Secondary | ICD-10-CM | POA: Diagnosis not present

## 2017-11-10 DIAGNOSIS — D631 Anemia in chronic kidney disease: Secondary | ICD-10-CM | POA: Diagnosis not present

## 2017-11-10 DIAGNOSIS — N186 End stage renal disease: Secondary | ICD-10-CM | POA: Diagnosis not present

## 2017-11-10 DIAGNOSIS — N2581 Secondary hyperparathyroidism of renal origin: Secondary | ICD-10-CM | POA: Diagnosis not present

## 2017-11-10 DIAGNOSIS — D509 Iron deficiency anemia, unspecified: Secondary | ICD-10-CM | POA: Diagnosis not present

## 2017-11-10 DIAGNOSIS — Z23 Encounter for immunization: Secondary | ICD-10-CM | POA: Diagnosis not present

## 2017-11-10 NOTE — Patient Instructions (Addendum)
Eli Pattillo.  11/10/2017   Your procedure is scheduled on: 11-18-17     Report to [facility] Main  Entrance    Report to Admitting at  8:00 AM    Call this number if you have problems the morning of surgery [phone]     Remember: Do not eat food or drink liquids :After Midnight.   BRUSH YOUR TEETH MORNING OF SURGERY AND RINSE YOUR MOUTH OUT, NO CHEWING GUM CANDY OR MINTS.     Take these medicines the morning of surgery with A SIP OF WATER:Isosorbide Mononitrate (Per pt's preference). You may also bring and use your eyedrops as needed.                                You may not have any metal on your body including hair pins and              piercings  Do not wear jewelry, lotions, powders, cologne, or deodorant             Men may shave face and neck.   Do not bring valuables to the hospital. [place].  Contacts, dentures or bridgework may not be worn into surgery.  .     Patients discharged the day of surgery will not be allowed to drive home.  Name and phone number of your driver: Treylin Burtch [phone]  Special Instructions: N/A              Please read over the following fact sheets you were given: _____________________________________________________________________             [facility] - Preparing for Surgery Before surgery, you can play an important role.  Because skin is not sterile, your skin needs to be as free of germs as possible.  You can reduce the number of germs on your skin by washing with CHG (chlorahexidine gluconate) soap before surgery.  CHG is an antiseptic cleaner which kills germs and bonds with the skin to continue killing germs even after washing. Please DO NOT use if you have an allergy to CHG or antibacterial soaps.  If your skin becomes reddened/irritated stop using the CHG and inform your nurse when you arrive at Short Stay. Do not shave (including legs and  underarms) for at least 48 hours prior to the first CHG shower.  You may shave your face/neck. Please follow these instructions carefully:  1.  Shower with CHG Soap the night before surgery and the  morning of Surgery.  2.  If you choose to wash your hair, wash your hair first as usual with your  normal  shampoo.  3.  After you shampoo, rinse your hair and body thoroughly to remove the  shampoo.                           4.  Use CHG as you would any other liquid soap.  You can apply chg directly  to the skin and wash                       Gently with a scrungie or clean washcloth.  5.  Apply the  CHG Soap to your body ONLY FROM THE NECK DOWN.   Do not use on face/ open                           Wound or open sores. Avoid contact with eyes, ears mouth and genitals (private parts).                       Wash face,  Genitals (private parts) with your normal soap.             6.  Wash thoroughly, paying special attention to the area where your surgery  will be performed.  7.  Thoroughly rinse your body with warm water from the neck down.  8.  DO NOT shower/wash with your normal soap after using and rinsing off  the CHG Soap.                9.  Pat yourself dry with a clean towel.            10.  Wear clean pajamas.            11.  Place clean sheets on your bed the night of your first shower and do not  sleep with pets. Day of Surgery : Do not apply any lotions/deodorants the morning of surgery.  Please wear clean clothes to the hospital/surgery center.  FAILURE TO FOLLOW THESE INSTRUCTIONS MAY RESULT IN THE CANCELLATION OF YOUR SURGERY PATIENT SIGNATURE_________________________________  NURSE SIGNATURE__________________________________  ________________________________________________________________________

## 2017-11-10 NOTE — Progress Notes (Signed)
08-04-17 (Epic) EKG  08-17-17 (Epic) CHO

## 2017-11-11 ENCOUNTER — Encounter (HOSPITAL_COMMUNITY)
Admission: RE | Admit: 2017-11-11 | Discharge: 2017-11-11 | Disposition: A | Discharge status: Home / Self Care | Payer: Medicare Other | Source: Ambulatory Visit | Attending: Surgery | Admitting: Surgery

## 2017-11-11 ENCOUNTER — Other Ambulatory Visit: Payer: Self-pay

## 2017-11-11 ENCOUNTER — Encounter (HOSPITAL_COMMUNITY): Payer: Self-pay

## 2017-11-11 DIAGNOSIS — D631 Anemia in chronic kidney disease: Secondary | ICD-10-CM | POA: Diagnosis not present

## 2017-11-11 DIAGNOSIS — N186 End stage renal disease: Secondary | ICD-10-CM | POA: Diagnosis not present

## 2017-11-11 DIAGNOSIS — Z01818 Encounter for other preprocedural examination: Secondary | ICD-10-CM | POA: Diagnosis not present

## 2017-11-11 DIAGNOSIS — Z8719 Personal history of other diseases of the digestive system: Secondary | ICD-10-CM

## 2017-11-11 DIAGNOSIS — N2581 Secondary hyperparathyroidism of renal origin: Secondary | ICD-10-CM | POA: Diagnosis not present

## 2017-11-11 DIAGNOSIS — Z9889 Other specified postprocedural states: Secondary | ICD-10-CM

## 2017-11-11 DIAGNOSIS — I129 Hypertensive chronic kidney disease with stage 1 through stage 4 chronic kidney disease, or unspecified chronic kidney disease: Secondary | ICD-10-CM | POA: Diagnosis not present

## 2017-11-11 DIAGNOSIS — Z992 Dependence on renal dialysis: Secondary | ICD-10-CM | POA: Diagnosis not present

## 2017-11-11 DIAGNOSIS — D509 Iron deficiency anemia, unspecified: Secondary | ICD-10-CM | POA: Diagnosis not present

## 2017-11-11 HISTORY — DX: Personal history of other diseases of the digestive system: Z87.19

## 2017-11-11 HISTORY — DX: Other specified postprocedural states: Z98.890

## 2017-11-11 NOTE — Progress Notes (Signed)
Pt reports that he takes his Carvedilol (Coreg) BID (in the afternoon and evening). He has noted that taking his combination of Carvedilol and Imdur in the morning makes his BP too low.

## 2017-11-12 DIAGNOSIS — Z992 Dependence on renal dialysis: Secondary | ICD-10-CM | POA: Diagnosis not present

## 2017-11-12 DIAGNOSIS — D509 Iron deficiency anemia, unspecified: Secondary | ICD-10-CM | POA: Diagnosis not present

## 2017-11-12 DIAGNOSIS — N186 End stage renal disease: Secondary | ICD-10-CM | POA: Diagnosis not present

## 2017-11-12 DIAGNOSIS — N2581 Secondary hyperparathyroidism of renal origin: Secondary | ICD-10-CM | POA: Diagnosis not present

## 2017-11-12 DIAGNOSIS — D631 Anemia in chronic kidney disease: Secondary | ICD-10-CM | POA: Diagnosis not present

## 2017-11-15 DIAGNOSIS — Z992 Dependence on renal dialysis: Secondary | ICD-10-CM | POA: Diagnosis not present

## 2017-11-15 DIAGNOSIS — N2581 Secondary hyperparathyroidism of renal origin: Secondary | ICD-10-CM | POA: Diagnosis not present

## 2017-11-15 DIAGNOSIS — N186 End stage renal disease: Secondary | ICD-10-CM | POA: Diagnosis not present

## 2017-11-15 DIAGNOSIS — D509 Iron deficiency anemia, unspecified: Secondary | ICD-10-CM | POA: Diagnosis not present

## 2017-11-15 DIAGNOSIS — D631 Anemia in chronic kidney disease: Secondary | ICD-10-CM | POA: Diagnosis not present

## 2017-11-15 NOTE — Progress Notes (Addendum)
Cardiac clearance dated 08-03-17 by cardiologist for pt's  Hernia surgery scheduled on 09/28/17.Surgery was cancelled by Dr. Mason Jim, Anesthesiologist  because pt had missed hemodialysis for 4 days. As result, there was hemodynamic and hypertension concerns. Pt is now schedule for the same procedure on 11/18/17.   Discussed chart with Dr. Fransisco Beau, Anesthesiologist. Cardiac clearance is still acceptable as long as pt is not  symptomatic for chest pain, etc. Pt denied chest pain during PAT appt.

## 2017-11-17 DIAGNOSIS — D631 Anemia in chronic kidney disease: Secondary | ICD-10-CM | POA: Diagnosis not present

## 2017-11-17 DIAGNOSIS — D509 Iron deficiency anemia, unspecified: Secondary | ICD-10-CM | POA: Diagnosis not present

## 2017-11-17 DIAGNOSIS — N2581 Secondary hyperparathyroidism of renal origin: Secondary | ICD-10-CM | POA: Diagnosis not present

## 2017-11-17 DIAGNOSIS — N186 End stage renal disease: Secondary | ICD-10-CM | POA: Diagnosis not present

## 2017-11-17 DIAGNOSIS — Z992 Dependence on renal dialysis: Secondary | ICD-10-CM | POA: Diagnosis not present

## 2017-11-18 ENCOUNTER — Encounter (HOSPITAL_COMMUNITY)
Admission: RE | Disposition: A | Discharge status: Home / Self Care | Payer: Self-pay | Source: Ambulatory Visit | Attending: Surgery

## 2017-11-18 ENCOUNTER — Ambulatory Visit (HOSPITAL_COMMUNITY): Payer: Medicare Other | Admitting: Certified Registered"

## 2017-11-18 ENCOUNTER — Other Ambulatory Visit: Payer: Self-pay

## 2017-11-18 ENCOUNTER — Ambulatory Visit (HOSPITAL_COMMUNITY)
Admission: RE | Admit: 2017-11-18 | Discharge: 2017-11-18 | Disposition: A | Discharge status: Home / Self Care | Payer: Medicare Other | Source: Ambulatory Visit | Attending: Surgery | Admitting: Surgery

## 2017-11-18 ENCOUNTER — Encounter (HOSPITAL_COMMUNITY): Payer: Self-pay | Admitting: *Deleted

## 2017-11-18 DIAGNOSIS — D176 Benign lipomatous neoplasm of spermatic cord: Secondary | ICD-10-CM | POA: Diagnosis not present

## 2017-11-18 DIAGNOSIS — I872 Venous insufficiency (chronic) (peripheral): Secondary | ICD-10-CM | POA: Insufficient documentation

## 2017-11-18 DIAGNOSIS — Z8249 Family history of ischemic heart disease and other diseases of the circulatory system: Secondary | ICD-10-CM | POA: Diagnosis not present

## 2017-11-18 DIAGNOSIS — Z823 Family history of stroke: Secondary | ICD-10-CM | POA: Diagnosis not present

## 2017-11-18 DIAGNOSIS — K429 Umbilical hernia without obstruction or gangrene: Secondary | ICD-10-CM | POA: Diagnosis present

## 2017-11-18 DIAGNOSIS — I509 Heart failure, unspecified: Secondary | ICD-10-CM | POA: Diagnosis not present

## 2017-11-18 DIAGNOSIS — K4020 Bilateral inguinal hernia, without obstruction or gangrene, not specified as recurrent: Secondary | ICD-10-CM | POA: Diagnosis not present

## 2017-11-18 DIAGNOSIS — I272 Pulmonary hypertension, unspecified: Secondary | ICD-10-CM | POA: Diagnosis not present

## 2017-11-18 DIAGNOSIS — N5082 Scrotal pain: Secondary | ICD-10-CM | POA: Diagnosis not present

## 2017-11-18 DIAGNOSIS — Z87891 Personal history of nicotine dependence: Secondary | ICD-10-CM | POA: Diagnosis not present

## 2017-11-18 DIAGNOSIS — N186 End stage renal disease: Secondary | ICD-10-CM | POA: Diagnosis not present

## 2017-11-18 DIAGNOSIS — Z992 Dependence on renal dialysis: Secondary | ICD-10-CM | POA: Diagnosis not present

## 2017-11-18 DIAGNOSIS — Z8261 Family history of arthritis: Secondary | ICD-10-CM | POA: Diagnosis not present

## 2017-11-18 DIAGNOSIS — Z79899 Other long term (current) drug therapy: Secondary | ICD-10-CM | POA: Diagnosis not present

## 2017-11-18 DIAGNOSIS — I132 Hypertensive heart and chronic kidney disease with heart failure and with stage 5 chronic kidney disease, or end stage renal disease: Secondary | ICD-10-CM | POA: Insufficient documentation

## 2017-11-18 DIAGNOSIS — K4090 Unilateral inguinal hernia, without obstruction or gangrene, not specified as recurrent: Secondary | ICD-10-CM

## 2017-11-18 HISTORY — PX: INSERTION OF MESH: SHX5868

## 2017-11-18 HISTORY — PX: LAPAROSCOPIC INGUINAL HERNIA WITH UMBILICAL HERNIA: SHX5658

## 2017-11-18 LAB — BASIC METABOLIC PANEL
Anion gap: 14 (ref 5–15)
BUN: 62 mg/dL — AB (ref 6–20)
CALCIUM: 9.3 mg/dL (ref 8.9–10.3)
CO2: 28 mmol/L (ref 22–32)
CREATININE: 8.62 mg/dL — AB (ref 0.61–1.24)
Chloride: 91 mmol/L — ABNORMAL LOW (ref 98–111)
GFR calc Af Amer: 7 mL/min — ABNORMAL LOW (ref >60)
GFR, EST NON AFRICAN AMERICAN: 6 mL/min — AB (ref >60)
Glucose, Bld: 99 mg/dL (ref 70–99)
Potassium: 5.8 mmol/L — ABNORMAL HIGH (ref 3.5–5.1)
Sodium: 133 mmol/L — ABNORMAL LOW (ref 135–145)

## 2017-11-18 LAB — CBC
HEMATOCRIT: 40.4 % (ref 39.0–52.0)
HEMOGLOBIN: 13.4 g/dL (ref 13.0–17.0)
MCH: 29.5 pg (ref 26.0–34.0)
MCHC: 33.2 g/dL (ref 30.0–36.0)
MCV: 88.8 fL (ref 80.0–100.0)
NRBC: 0 % (ref 0.0–0.2)
Platelets: 250 10*3/uL (ref 150–400)
RBC: 4.55 MIL/uL (ref 4.22–5.81)
RDW: 11.9 % (ref 11.5–15.5)
WBC: 8.3 10*3/uL (ref 4.0–10.5)

## 2017-11-18 SURGERY — LAPAROSCOPIC INGUINAL HERNIA WITH UMBILICAL HERNIA
Anesthesia: General

## 2017-11-18 MED ORDER — KETAMINE HCL 10 MG/ML IJ SOLN
INTRAMUSCULAR | Status: DC | PRN
  Administered 2017-11-18 (×2): 10 mg via INTRAVENOUS

## 2017-11-18 MED ORDER — PROPOFOL 10 MG/ML IV BOLUS
INTRAVENOUS | Status: DC | PRN
  Administered 2017-11-18: 180 mg via INTRAVENOUS

## 2017-11-18 MED ORDER — KETAMINE HCL 10 MG/ML IJ SOLN
INTRAMUSCULAR | Status: AC
  Filled 2017-11-18: qty 1

## 2017-11-18 MED ORDER — PROPOFOL 10 MG/ML IV BOLUS
INTRAVENOUS | Status: AC
  Filled 2017-11-18: qty 20

## 2017-11-18 MED ORDER — CISATRACURIUM BESYLATE 20 MG/10ML IV SOLN
INTRAVENOUS | Status: AC
  Filled 2017-11-18: qty 10

## 2017-11-18 MED ORDER — ONDANSETRON HCL 4 MG/2ML IJ SOLN: 4.0000 mg | Freq: Four times a day (QID) | INTRAMUSCULAR | Status: DC | PRN

## 2017-11-18 MED ORDER — 0.9 % SODIUM CHLORIDE (POUR BTL) OPTIME
TOPICAL | Status: DC | PRN
  Administered 2017-11-18: 1000 mL

## 2017-11-18 MED ORDER — BUPIVACAINE-EPINEPHRINE (PF) 0.25% -1:200000 IJ SOLN
INTRAMUSCULAR | Status: AC
  Filled 2017-11-18: qty 60

## 2017-11-18 MED ORDER — GABAPENTIN 300 MG PO CAPS: 300.0000 mg | ORAL_CAPSULE | Freq: Two times a day (BID) | ORAL | 1 refills | Status: DC

## 2017-11-18 MED ORDER — BUPIVACAINE LIPOSOME 1.3 % IJ SUSP
20.0000 mL | Freq: Once | INTRAMUSCULAR | Status: DC
  Filled 2017-11-18: qty 20

## 2017-11-18 MED ORDER — CEFAZOLIN SODIUM-DEXTROSE 2-4 GM/100ML-% IV SOLN
2.0000 g | INTRAVENOUS | Status: AC
  Administered 2017-11-18: 2 g via INTRAVENOUS
  Filled 2017-11-18: qty 100

## 2017-11-18 MED ORDER — FENTANYL CITRATE (PF) 250 MCG/5ML IJ SOLN
INTRAMUSCULAR | Status: DC | PRN
  Administered 2017-11-18 (×5): 50 ug via INTRAVENOUS

## 2017-11-18 MED ORDER — DEXAMETHASONE SODIUM PHOSPHATE 10 MG/ML IJ SOLN
INTRAMUSCULAR | Status: DC | PRN
  Administered 2017-11-18: 8 mg via INTRAVENOUS

## 2017-11-18 MED ORDER — MIDAZOLAM HCL 2 MG/2ML IJ SOLN
INTRAMUSCULAR | Status: DC | PRN
  Administered 2017-11-18 (×2): 1 mg via INTRAVENOUS

## 2017-11-18 MED ORDER — TRAMADOL HCL 50 MG PO TABS: 50.0000 mg | ORAL_TABLET | Freq: Four times a day (QID) | ORAL | 0 refills | Status: DC | PRN

## 2017-11-18 MED ORDER — SODIUM CHLORIDE 0.9 % IV SOLN
INTRAVENOUS | Status: DC
  Administered 2017-11-18: 09:00:00 via INTRAVENOUS

## 2017-11-18 MED ORDER — FENTANYL CITRATE (PF) 250 MCG/5ML IJ SOLN
INTRAMUSCULAR | Status: AC
  Filled 2017-11-18: qty 5

## 2017-11-18 MED ORDER — CISATRACURIUM BESYLATE (PF) 10 MG/5ML IV SOLN
INTRAVENOUS | Status: DC | PRN
  Administered 2017-11-18 (×2): 2 mg via INTRAVENOUS
  Administered 2017-11-18: 1 mg via INTRAVENOUS
  Administered 2017-11-18: 8 mg via INTRAVENOUS

## 2017-11-18 MED ORDER — LIDOCAINE 2% (20 MG/ML) 5 ML SYRINGE
INTRAMUSCULAR | Status: DC | PRN
  Administered 2017-11-18: 60 mg via INTRAVENOUS

## 2017-11-18 MED ORDER — GLYCOPYRROLATE 0.2 MG/ML IJ SOLN
INTRAMUSCULAR | Status: DC | PRN
  Administered 2017-11-18: .8 mg via INTRAVENOUS

## 2017-11-18 MED ORDER — NEOSTIGMINE METHYLSULFATE 3 MG/3ML IV SOSY
PREFILLED_SYRINGE | INTRAVENOUS | Status: AC
  Filled 2017-11-18: qty 3

## 2017-11-18 MED ORDER — BUPIVACAINE-EPINEPHRINE 0.25% -1:200000 IJ SOLN
INTRAMUSCULAR | Status: DC | PRN
  Administered 2017-11-18: 60 mL

## 2017-11-18 MED ORDER — CHLORHEXIDINE GLUCONATE CLOTH 2 % EX PADS: 6.0000 | MEDICATED_PAD | Freq: Once | CUTANEOUS | Status: DC

## 2017-11-18 MED ORDER — FENTANYL CITRATE (PF) 100 MCG/2ML IJ SOLN: 25.0000 ug | INTRAMUSCULAR | Status: DC | PRN

## 2017-11-18 MED ORDER — NEOSTIGMINE METHYLSULFATE 10 MG/10ML IV SOLN
INTRAVENOUS | Status: DC | PRN
  Administered 2017-11-18: 4.5 mg via INTRAVENOUS

## 2017-11-18 MED ORDER — LIDOCAINE 2% (20 MG/ML) 5 ML SYRINGE
INTRAMUSCULAR | Status: DC | PRN
  Administered 2017-11-18: 1 mg/kg/h via INTRAVENOUS

## 2017-11-18 MED ORDER — MIDAZOLAM HCL 2 MG/2ML IJ SOLN
INTRAMUSCULAR | Status: AC
  Filled 2017-11-18: qty 2

## 2017-11-18 MED ORDER — OXYCODONE HCL 5 MG/5ML PO SOLN
5.0000 mg | Freq: Once | ORAL | Status: DC | PRN
  Filled 2017-11-18: qty 5

## 2017-11-18 MED ORDER — ACETAMINOPHEN 500 MG PO TABS
1000.0000 mg | ORAL_TABLET | ORAL | Status: AC
  Administered 2017-11-18: 1000 mg via ORAL
  Filled 2017-11-18: qty 2

## 2017-11-18 MED ORDER — GABAPENTIN 300 MG PO CAPS
300.0000 mg | ORAL_CAPSULE | ORAL | Status: AC
  Administered 2017-11-18: 300 mg via ORAL
  Filled 2017-11-18: qty 1

## 2017-11-18 MED ORDER — BUPIVACAINE LIPOSOME 1.3 % IJ SUSP
INTRAMUSCULAR | Status: DC | PRN
  Administered 2017-11-18: 20 mL

## 2017-11-18 MED ORDER — GLYCOPYRROLATE PF 0.2 MG/ML IJ SOSY
PREFILLED_SYRINGE | INTRAMUSCULAR | Status: AC
  Filled 2017-11-18: qty 1

## 2017-11-18 MED ORDER — OXYCODONE HCL 5 MG PO TABS: 5.0000 mg | ORAL_TABLET | Freq: Once | ORAL | Status: DC | PRN

## 2017-11-18 MED ORDER — SODIUM CHLORIDE 0.9 % IV SOLN
INTRAVENOUS | Status: DC | PRN
  Administered 2017-11-18: 10 ug/min via INTRAVENOUS

## 2017-11-18 MED ORDER — ESMOLOL HCL 100 MG/10ML IV SOLN
INTRAVENOUS | Status: DC | PRN
  Administered 2017-11-18 (×2): 10 mg via INTRAVENOUS

## 2017-11-18 SURGICAL SUPPLY — 49 items
CABLE HIGH FREQUENCY MONO STRZ (ELECTRODE) ×4 IMPLANT
CHLORAPREP W/TINT 26ML (MISCELLANEOUS) ×4 IMPLANT
COVER SURGICAL LIGHT HANDLE (MISCELLANEOUS) ×4 IMPLANT
COVER WAND RF STERILE (DRAPES) ×2 IMPLANT
DECANTER SPIKE VIAL GLASS SM (MISCELLANEOUS) ×4 IMPLANT
DEVICE SECURE STRAP 25 ABSORB (INSTRUMENTS) IMPLANT
DRAPE WARM FLUID 44X44 (DRAPE) ×4 IMPLANT
DRSG TEGADERM 2-3/8X2-3/4 SM (GAUZE/BANDAGES/DRESSINGS) ×8 IMPLANT
DRSG TEGADERM 4X4.75 (GAUZE/BANDAGES/DRESSINGS) ×4 IMPLANT
ELECT REM PT RETURN 15FT ADLT (MISCELLANEOUS) ×4 IMPLANT
GAUZE SPONGE 2X2 8PLY STRL LF (GAUZE/BANDAGES/DRESSINGS) ×2 IMPLANT
GLOVE BIOGEL PI IND STRL 6 (GLOVE) IMPLANT
GLOVE BIOGEL PI IND STRL 7.0 (GLOVE) IMPLANT
GLOVE BIOGEL PI IND STRL 7.5 (GLOVE) IMPLANT
GLOVE BIOGEL PI INDICATOR 6 (GLOVE) ×4
GLOVE BIOGEL PI INDICATOR 7.0 (GLOVE) ×4
GLOVE BIOGEL PI INDICATOR 7.5 (GLOVE) ×2
GLOVE ECLIPSE 8.0 STRL XLNG CF (GLOVE) ×4 IMPLANT
GLOVE INDICATOR 8.0 STRL GRN (GLOVE) ×4 IMPLANT
GLOVE SURG SS PI 6.0 STRL IVOR (GLOVE) ×4 IMPLANT
GLOVE SURG SS PI 7.0 STRL IVOR (GLOVE) ×2 IMPLANT
GOWN STRL REUS W/ TWL LRG LVL3 (GOWN DISPOSABLE) IMPLANT
GOWN STRL REUS W/TWL LRG LVL3 (GOWN DISPOSABLE) ×16
GOWN STRL REUS W/TWL XL LVL3 (GOWN DISPOSABLE) ×8 IMPLANT
IRRIG SUCT STRYKERFLOW 2 WTIP (MISCELLANEOUS)
IRRIGATION SUCT STRKRFLW 2 WTP (MISCELLANEOUS) IMPLANT
KIT BASIN OR (CUSTOM PROCEDURE TRAY) ×4 IMPLANT
MARKER SKIN DUAL TIP RULER LAB (MISCELLANEOUS) ×2 IMPLANT
MESH HERNIA 6X6 BARD (Mesh General) IMPLANT
MESH HERNIA BARD 6X6 (Mesh General) ×2 IMPLANT
MESH ULTRAPRO 6X6 15CM15CM (Mesh General) ×2 IMPLANT
NDL INSUFFLATION 14GA 120MM (NEEDLE) IMPLANT
NEEDLE INSUFFLATION 14GA 120MM (NEEDLE) IMPLANT
PAD POSITIONING PINK XL (MISCELLANEOUS) ×4 IMPLANT
SCISSORS LAP 5X35 DISP (ENDOMECHANICALS) ×4 IMPLANT
SLEEVE ADV FIXATION 5X100MM (TROCAR) ×4 IMPLANT
SPONGE GAUZE 2X2 STER 10/PKG (GAUZE/BANDAGES/DRESSINGS) ×2
SUT MNCRL AB 4-0 PS2 18 (SUTURE) ×4 IMPLANT
SUT PDS AB 1 CT1 27 (SUTURE) ×10 IMPLANT
SUT VIC AB 2-0 SH 27 (SUTURE) ×8
SUT VIC AB 2-0 SH 27X BRD (SUTURE) IMPLANT
SUT VICRYL 0 UR6 27IN ABS (SUTURE) ×4 IMPLANT
TACKER 5MM HERNIA 3.5CML NAB (ENDOMECHANICALS) IMPLANT
TOWEL OR 17X26 10 PK STRL BLUE (TOWEL DISPOSABLE) ×4 IMPLANT
TOWEL OR NON WOVEN STRL DISP B (DISPOSABLE) ×4 IMPLANT
TRAY LAPAROSCOPIC (CUSTOM PROCEDURE TRAY) ×4 IMPLANT
TROCAR ADV FIXATION 5X100MM (TROCAR) ×4 IMPLANT
TROCAR XCEL BLUNT TIP 100MML (ENDOMECHANICALS) ×4 IMPLANT
TUBING INSUF HEATED (TUBING) ×4 IMPLANT

## 2017-11-18 NOTE — Anesthesia Postprocedure Evaluation (Signed)
Anesthesia Post Note  Patient: Jason [name].  Procedure(s) Performed: LAPAROSCOPIC BILATERAL INGUINAL HERNIA REPAIR WITH UMBILICAL HERNIA REPAIR WITH MESH (Bilateral ) INSERTION OF MESH (N/A )     Patient location during evaluation: PACU Anesthesia Type: General Level of consciousness: awake and alert Pain management: pain level controlled Vital Signs Assessment: post-procedure vital signs reviewed and stable Respiratory status: spontaneous breathing, nonlabored ventilation, respiratory function stable and patient connected to nasal cannula oxygen Cardiovascular status: blood pressure returned to baseline and stable Postop Assessment: no apparent nausea or vomiting Anesthetic complications: no    Last Vitals:  Vitals:   11/18/17 1354 11/18/17 1420  BP: (!) 153/76 (!) 150/70  Pulse: (!) 55 (!) 58  Resp: 18 16  Temp: 36.7 C 36.7 C  SpO2: 97% 98%    Last Pain:  Vitals:   11/18/17 1420  TempSrc:   PainSc: 0-No pain                 Naesha Buckalew S

## 2017-11-18 NOTE — Op Note (Signed)
11/18/2017  12:41 PM  PATIENT:  Jason [name]  57 y.o. male  Patient Care Team: [name], FNP as PCP - General (Family Medicine) [name] [name], MD as PCP - Cardiology (Cardiology) Center, [facility] [name], [name], MD as Consulting Physician (Cardiology) [name], MD as Consulting Physician (Nephrology) [name], MD as Consulting Physician (Nephrology) Michael Boston, MD as Consulting Physician (General Surgery)  PRE-OPERATIVE DIAGNOSIS:  Bilateral inguinal (right giant scrotal) hernias. Umbilical hernia  POST-OPERATIVE DIAGNOSIS:   Bilateral inguinal (right giant scrotal) hernias.  Umbilical hernia  PROCEDURE:  LAPAROSCOPIC BILATERAL INGUINAL HERNIA REPAIR WITH MESH PRIMARY UMBILICAL HERNIA REPAIR   SURGEON:  Adin Hector, MD  ASSISTANT: None  ANESTHESIA:     Regional ilioinguinal and genitofemoral and spermatic cord nerve blocks  General  EBL:  Total I/O In: 400 [I.V.:400] Out: 25 [Blood:25].  See anesthesia record  Delay start of Pharmacological VTE agent (>24hrs) due to surgical blood loss or risk of bleeding:  no  DRAINS: NONE  SPECIMEN:  NONE  DISPOSITION OF SPECIMEN:  N/A  COUNTS:  YES  PLAN OF CARE: Discharge to home after PACU  PATIENT DISPOSITION:  PACU - hemodynamically stable.  INDICATION: Renal failure stable on hemodialysis.  Hypertension.  Large right inguinal hernia going down to scrotum with episode of pain.  Reducible.  I recommended operative exploration and repair.  To be scheduled first time due to missing dialysis.  Cardiac is ready to proceed with surgery.  The anatomy & physiology of the abdominal wall and pelvic floor was discussed.  The pathophysiology of hernias in the inguinal and pelvic region was discussed.  Natural history risks such as progressive enlargement, pain, incarceration & strangulation was discussed.   Contributors to complications such as smoking, obesity,  diabetes, prior surgery, etc were discussed.    I feel the risks of no intervention will lead to serious problems that outweigh the operative risks; therefore, I recommended surgery to reduce and repair the hernia.  I explained laparoscopic techniques with possible need for an open approach.  I noted usual use of mesh to patch and/or buttress hernia repair  Risks such as bleeding, infection, abscess, need for further treatment, heart attack, death, and other risks were discussed.  I noted a good likelihood this will help address the problem.   Goals of post-operative recovery were discussed as well.  Possibility that this will not correct all symptoms was explained.  I stressed the importance of low-impact activity, aggressive pain control, avoiding constipation, & not pushing through pain to minimize risk of post-operative chronic pain or injury. Possibility of reherniation was discussed.  We will work to minimize complications.     An educational handout further explaining the pathology & treatment options was given as well.  Questions were answered.  The patient expresses understanding & wishes to proceed with surgery.  OR FINDINGS: Moderate size right indirect inguinal hernia going down to scrotum.  No masses though.  No direct space femoral or obturator hernias.  The left side small indirect inguinal hernia.  No direct space femoral or obturator hernias.  Umbilical hernia 15 mm containing preperitoneal fat and omentum.  Primarily repaired.  Excess skin excised umbilical plasty.  DESCRIPTION:  The patient was identified & brought into the operating room. The patient was positioned supine with arms tucked. SCDs were active during the entire case. The patient underwent general anesthesia without any difficulty.  The abdomen was prepped and draped in a sterile  fashion. The patient's bladder was emptied.  A Surgical Timeout confirmed our plan.  I made a transverse incision through the inferior  umbilical fold.  I made a small transverse nick through the anterior rectus fascia contralateral to the inguinal hernia side and placed a 0-vicryl stitch through the fascia.  I placed a Hasson trocar into the preperitoneal plane.  Entry was clean.  We induced carbon dioxide insufflation. Camera inspection revealed no injury.  I used a 89mm angled scope to bluntly free the peritoneum off the infraumbilical anterior abdominal wall.  I created enough of a preperitoneal pocket to place 70mm ports into the right & left mid-abdomen into this preperitoneal cavity.  I focused attention on the RIGHT pelvis since that was the dominant hernia side.   I used blunt & focused sharp dissection to free the peritoneum off the flank and down to the pubic rim.  I freed the anteriolateral bladder wall off the anteriolateral pelvic wall, sparing midline attachments.   I located a swath of peritoneum going into a hernia fascial defect at the  internal ring consistent with  an indirect inguinal hernia.  I gradually freed the peritoneal hernia sac out of the scrotum and off safely and reduced it into the preperitoneal space.  I freed the peritoneum off the spermatic vessels & vas deferens.  I freed peritoneum off the retroperitoneum along the psoas muscle.  Spermatic cord lipoma was dissected away & removed.  Patient had very redundant hernia sac.  I excised excess tissue and removed it.  I primarily closed the peritoneal defect using 2-0 Vicryl laparoscopic intracorporeal suturing.  I checked & assured hemostasis.     I turned attention on the opposite  LEFT pelvis.  I did dissection in a similar, mirror-image fashion. The patient had an indirect inguinal hernia.Marland Kitchen   Spermatic cord lipoma was dissected away & removed.    I checked & assured hemostasis.  As anticipated, surgical dissection was challenged by dense adhesions and poor planes resulting in the need for careful repair of the resulting peritoneal hernia sac defect.  Repair was  done with 2-0 vicryl minimally invasive intracorporeal suturing using absorbable suture    I chose 15x15 cm sheets of mesh size.  Because the right side had a larger defect, I used a Bard standard weight loss that was smaller so I chose ultra-lightweight polypropylene mesh (Ultrapro) for the left side.  I cut a single sigmoid-shaped slit ~6cm from a corner of each mesh.  I placed the meshes into the preperitoneal space & laid them as overlapping diamonds such that at the inferior points, a 6x6 cm corner flap rested in the true anterolateral pelvis, covering the obturator & femoral foramina.   I allowed the bladder to return to the pubis, this helping tuck the corners of the mesh in the anteriolateral pelvis.  The medial corners overlapped each other across midline cephalad to the pubic rim.   This provided >2 inch coverage around the hernias. Because the defects well covered and not particularly large, I did not place any tacks.   I held the hernia sacs cephalad & evacuated carbon dioxide.  I closed the fascia with absorbable suture.  I closed the skin using 4-0 monocryl stitch.  Sterile dressings were applied.   The patient was extubated & arrived in the PACU in stable condition..  I had discussed postoperative care with the patient in the holding area.  Instructions are written in the chart.  I discussed operative findings, updated  the patient's status, discussed probable steps to recovery, and gave postoperative recommendations to the patient's family.  Recommendations were made.  Questions were answered.  She expressed understanding & appreciation.   Adin Hector, M.D., F.A.C.S. Gastrointestinal and Minimally Invasive Surgery [facility] [address] [place], [place] [address] [phone] Main / Paging  11/18/2017 12:41 PM

## 2017-11-18 NOTE — Interval H&P Note (Signed)
History and Physical Interval Note:  11/18/2017 9:44 AM  Jason [name].  has presented today for surgery, with the diagnosis of Bilateral inguinal (right giant scrotal) hernias. Umbilical hernia  The various methods of treatment have been discussed with the patient and family. After consideration of risks, benefits and other options for treatment, the patient has consented to  Procedure(s): LAPAROSCOPIC POSSIBLE OPEN  BILATERAL INGUINAL HERNIA REPAIR WITH UMBILICAL HERNIA REPAIR WITH MESH (Bilateral) INSERTION OF MESH (N/A) as a surgical intervention .  The patient's history has been reviewed, patient examined, no change in status, stable for surgery.  I have reviewed the patient's chart and labs.  Questions were answered to the patient's satisfaction.     I have re-reviewed the the patient's records, history, medications, and allergies.  I have re-examined the patient.  I again discussed intraoperative plans and goals of post-operative recovery.  The patient agrees to proceed.  Jason [name].  Oct 16, 1960 [id number]  Patient Care Team: [name], FNP as PCP - General (Family Medicine) [name] [name], MD as PCP - Cardiology (Cardiology) Center, [facility] [name], [name], MD as Consulting Physician (Cardiology) [name], MD as Consulting Physician (Nephrology) [name], MD as Consulting Physician (Nephrology) Michael Boston, MD as Consulting Physician (General Surgery)  Patient Active Problem List   Diagnosis Date Noted  . ESRD (end stage renal disease) ([place])     Priority: High  . Scrotal hernia, right 05/30/2017  . Arteriovenous fistula (Right arm) for dialysis 05/30/2017  . Umbilical hernia 54/27/0623  . Anasarca 02/18/2017  . NSVT (nonsustained ventricular tachycardia) ([place])   . Evaluation by psychiatric service required 01/19/2017  . Elevated troponin 01/16/2017  . Acute CHF (congestive heart failure) ([place]) 01/16/2017  .  Venous stasis ulcer ([place]) 01/16/2017  . Essential hypertension 02/26/2015  . Abnormal EKG 01/30/2015  . Anemia 01/29/2015    Past Medical History:  Diagnosis Date  . A-V fistula (H[facility])    Right arm  . Abdominal hernia   . AKI (acute kidney injury) ([place]) 01/2015  . Anasarca   . Anemia   . Bilateral inguinal hernia   . Bilateral pleural effusion 01/2017  . Cellulitis 01/16/2017   Bilateral lower extremity  . CHF (congestive heart failure) ([place])   . Chronic venous insufficiency   . Concentric left ventricular hypertrophy 08/17/2017   Moderated, noted on ECHO  . Diastolic dysfunction 76/28/3151   noted on ECHO  . Elevated LFTs    per patient, resolved  . End stage renal disease [facility])    Dialysis T/Th/Sa  Started 02/2017  . Hypertension   . Pneumonia   . Pulmonary hypertension (HCC)    Moderate  . Venous stasis ulcer ([place])     Past Surgical History:  Procedure Laterality Date  . AV FISTULA PLACEMENT Right 01/20/2017   Procedure: ARTERIOVENOUS (AV) FISTULA CREATION;  Surgeon: Angelia Mould, MD;  Location: [facility];  Service: Vascular;  Laterality: Right;  . INSERTION OF DIALYSIS CATHETER Right 01/20/2017   Procedure: INSERTION OF DIALYSIS CATHETER;  Surgeon: Angelia Mould, MD;  Location: [facility];  Service: Vascular;  Laterality: Right;  . IR FLUORO GUIDE CV LINE RIGHT  01/18/2017  . IR US GUIDE VASC ACCESS RIGHT  01/18/2017  . TOE SURGERY    . TONSILLECTOMY      Social History   Socioeconomic History  . Marital status: Single    Spouse name: Not on file  . Number of children:  Not on file  . Years of education: Not on file  . Highest education level: Not on file  Occupational History  . Not on file  Social Needs  . Financial resource strain: Not on file  . Food insecurity:    Worry: Not on file    Inability: Not on file  . Transportation needs:    Medical: Not on file    Non-medical: Not on file  Tobacco Use  . Smoking status: Former Smoker    Years:  17.00  . Smokeless tobacco: Never Used  . Tobacco comment: quit smoking in 1997  Substance and Sexual Activity  . Alcohol use: Yes    Comment: social  . Drug use: Not Currently    Comment: marijuana  . Sexual activity: Not on file  Lifestyle  . Physical activity:    Days per week: Not on file    Minutes per session: Not on file  . Stress: Not on file  Relationships  . Social connections:    Talks on phone: Not on file    Gets together: Not on file    Attends religious service: Not on file    Active member of club or organization: Not on file    Attends meetings of clubs or organizations: Not on file    Relationship status: Not on file  . Intimate partner violence:    Fear of current or ex partner: Not on file    Emotionally abused: Not on file    Physically abused: Not on file    Forced sexual activity: Not on file  Other Topics Concern  . Not on file  Social History Narrative  . Not on file    Family History  Problem Relation Age of Onset  . Heart attack Mother 96       CABG hx  . Heart disease Mother   . Hypertension Mother   . Healthy Father   . Healthy Brother     Medications Prior to Admission  Medication Sig Dispense Refill Last Dose  . ALOE VERA JUICE LIQD Take 30 mLs by mouth daily.   Past Month at Unknown time  . amLODipine (NORVASC) 10 MG tablet Take 1 tablet (10 mg total) by mouth daily. 90 tablet 1 11/17/2017 at Unknown time  . calcium acetate (PHOSLO) 667 MG capsule Take 1 capsule (667 mg total) by mouth 3 (three) times daily with meals. 90 capsule 1 11/17/2017 at Unknown time  . carvedilol (COREG) 25 MG tablet Take 1 tablet (25 mg total) by mouth 2 (two) times daily with a meal. 60 tablet 2 11/18/2017 at 0835  . isosorbide mononitrate (IMDUR) 60 MG 24 hr tablet Take 1 tablet (60 mg total) by mouth daily. 90 tablet 1 11/17/2017 at Unknown time  . lidocaine-prilocaine (EMLA) cream Apply 1 application topically See admin instructions. Apply 1-2 hours before  dialysis. Cover with occlusive dressing 3 days weekly (Tuesday, Thursday, & Saturdays)  5 11/17/2017 at Unknown time  . multivitamin (RENA-VIT) TABS tablet Take 1 tablet by mouth daily.  11 11/17/2017 at Unknown time  . tetrahydrozoline 0.05 % ophthalmic solution Place 1 drop into both eyes daily.   11/18/2017 at 0600  . UNABLE TO FIND Take 1 capsule by mouth 3 (three) times daily. Circu Flow-- herbal supplement   Past Month at Unknown time    Current Facility-Administered Medications  Medication Dose Route Frequency Provider Last Rate Last Dose  . 0.9 %  sodium chloride infusion   Intravenous Continuous  Albertha Ghee, MD 10 mL/hr at 11/18/17 0840    . acetaminophen (TYLENOL) tablet 1,000 mg  1,000 mg Oral On Call to OR Michael Boston, MD      . bupivacaine liposome (EXPAREL) 1.3 % injection 266 mg  20 mL Infiltration Once Michael Boston, MD      . ceFAZolin (ANCEF) IVPB 2g/100 mL premix  2 g Intravenous On Call to OR Michael Boston, MD      . Chlorhexidine Gluconate Cloth 2 % PADS 6 each  6 each Topical Once Michael Boston, MD       And  . Chlorhexidine Gluconate Cloth 2 % PADS 6 each  6 each Topical Once Michael Boston, MD      . gabapentin (NEURONTIN) capsule 300 mg  300 mg Oral On Call to OR Michael Boston, MD         Allergies  Allergen Reactions  . Lisinopril Cough    BP (!) 122/92   Pulse (!) 58   Temp 97.7 F (36.5 C) (Oral)   Resp 16   Ht 6\' 2"  (1.88 m)   Wt 82.7 kg   SpO2 100%   BMI 23.42 kg/m   Labs: No results found for this or any previous visit (from the past 48 hour(s)).  Imaging / Studies: No results found.   Adin Hector, M.D., F.A.C.S. Gastrointestinal and Minimally Invasive Surgery [facility] [address], [place], [place] [address] [phone] Main / Paging  11/18/2017 9:44 AM   Adin Hector

## 2017-11-18 NOTE — H&P (Signed)
[name] DOB: November 26, 1960   Patient Care Team: [name], FNP as PCP - General (Family Medicine) [name] [name], MD as PCP - Cardiology (Cardiology) [facility] [name], [name], MD as Consulting Physician (Cardiology) [name], MD as Consulting Physician (Nephrology) [name], MD as Consulting Physician (Nephrology) Michael Boston, MD as Consulting Physician (General Surgery)   Patient sent for surgical consultation at the request of Donia Pounds MSN, FNP-C  Chief Complaint: Inguinal hernia  The patient is a 57 year old male with end-stage renal disease dialysis dependent. Sounds like he was relying on holistic doctors but has transitioned back to allopathic medicine. Getting hypertension and kidney disease under control. Gets dialysis on Tuesdays Thursdays Saturdays now. Hypertension. Noted right groin bulging about 8 years ago. It has gradually gotten larger. Worse over the past few months. It is out all the time. Getting more difficult to reduce. Mentioned to his primary care provider. Large inguinal hernia noted Surgical consultation requested. He did have some chronic leg ulcers that have been followed by wound ostomy care. Had to wear a wound care boot for several months. However they are healed up and there is one small scab on his left leg. No other infections. Just has 1 small scab remaining. He used to smoke. He quit. He is not on any blood thinners. He claims he moves his bowels about twice a day. No prior abdominal surgery. Claims he can walk 20-30 minutes without much difficulty.   Case cancelled August due to pt missing HD x 4 days & desire for cardiac clearance.  Cardiac clearance done.  Pt Ready for surgery.  Did HD yesterday as recommended.  No new events  (Review of systems as stated in this history (HPI) or in the review of systems. Otherwise all other 12 point ROS are  negative) ` ` `   Past Surgical History Illene Regulus, CMA; 05/30/2017 10:08 AM) Dialysis Shunt / Fistula   Diagnostic Studies History Lars Mage Spillers, CMA; 05/30/2017 10:08 AM) Colonoscopy  within last year  Allergies (Tanisha A. Owens Shark, [place]; 05/30/2017 9:33 AM) Lisinopril *ANTIHYPERTENSIVES*  Allergies Reconciled   Medication History (Tanisha A. Owens Shark, [place]; 05/30/2017 9:33 AM) Carvedilol (25MG  Tablet, Oral) Active. AmLODIPine Besylate (10MG  Tablet, Oral) Active. Calcium Acetate (Phos Binder) (667MG  Capsule, Oral) Active. Isosorbide Mononitrate ER (60MG  Tablet ER 24HR, Oral) Active. Rena-Vite (Oral) Active. Medications Reconciled  Social History Illene Regulus, CMA; 05/30/2017 10:08 AM) Alcohol use  Remotely quit alcohol use. Caffeine use  Coffee, Tea. No drug use  Tobacco use  Former smoker.  Family History Illene Regulus, [place]; 05/30/2017 10:08 AM) Arthritis  Mother. Cerebrovascular Accident  Mother. Heart Disease  Mother. Hypertension  Mother.  Other Problems Lars Mage Spillers, CMA; 05/30/2017 10:08 AM) Chronic Renal Failure Syndrome  Congestive Heart Failure  High blood pressure     Review of Systems (Alisha Spillers CMA; 05/30/2017 10:08 AM) General Not Present- Appetite Loss, Chills, Fatigue, Fever, Night Sweats, Weight Gain and Weight Loss. Skin Present- Non-Healing Wounds. Not Present- Change in Wart/Mole, Dryness, Hives, Jaundice, New Lesions, Rash and Ulcer. HEENT Not Present- Earache, Hearing Loss, Hoarseness, Nose Bleed, Oral Ulcers, Ringing in the Ears, Seasonal Allergies, Sinus Pain, Sore Throat, Visual Disturbances, Wears glasses/contact lenses and Yellow Eyes. Respiratory Not Present- Bloody sputum, Chronic Cough, Difficulty Breathing, Snoring and Wheezing. Breast Not Present- Breast Mass, Breast Pain, Nipple Discharge and Skin Changes. Cardiovascular Not Present- Chest Pain, Difficulty Breathing Lying Down, Leg Cramps,  Palpitations, Rapid Heart Rate, Shortness  of Breath and Swelling of Extremities. Gastrointestinal Not Present- Abdominal Pain, Bloating, Bloody Stool, Change in Bowel Habits, Chronic diarrhea, Constipation, Difficulty Swallowing, Excessive gas, Gets full quickly at meals, Hemorrhoids, Indigestion, Nausea, Rectal Pain and Vomiting. Male Genitourinary Not Present- Blood in Urine, Change in Urinary Stream, Frequency, Impotence, Nocturia, Painful Urination, Urgency and Urine Leakage. Musculoskeletal Not Present- Back Pain, Joint Pain, Joint Stiffness, Muscle Pain, Muscle Weakness and Swelling of Extremities. Neurological Not Present- Decreased Memory, Fainting, Headaches, Numbness, Seizures, Tingling, Tremor, Trouble walking and Weakness. Psychiatric Not Present- Anxiety, Bipolar, Change in Sleep Pattern, Depression, Fearful and Frequent crying. Endocrine Not Present- Cold Intolerance, Excessive Hunger, Hair Changes, Heat Intolerance, Hot flashes and New Diabetes. Hematology Not Present- Blood Thinners, Easy Bruising, Excessive bleeding, Gland problems, HIV and Persistent Infections.  Vitals (Tanisha A. Brown RMA; 05/30/2017 9:31 AM) 05/30/2017 9:30 AM Weight: 195.6 lb Height: 74in Body Surface Area: 2.15 m Body Mass Index: 25.11 kg/m  Temp.: 97.65F  Pulse: 83 (Regular)  BP: 156/84 (Sitting, Left Arm, Standard)   BP (!) 122/92   Pulse (!) 58   Temp 97.7 F (36.5 C) (Oral)   Resp 16   Ht 6\' 2"  (1.88 m)   Wt 82.7 kg   SpO2 100%   BMI 23.42 kg/m      Physical Exam Adin Hector MD; 05/30/2017 9:53 AM) General Mental Status-Alert. General Appearance-Not in acute distress, Not Sickly. Orientation-Oriented X3. Hydration-Well hydrated. Voice-Normal.  Integumentary Global Assessment Upon inspection and palpation of skin surfaces of the - Axillae: non-tender, no inflammation or ulceration, no drainage. and Distribution of scalp and body hair is  normal. General Characteristics Temperature - normal warmth is noted.  Head and Neck Head-normocephalic, atraumatic with no lesions or palpable masses. Face Global Assessment - atraumatic, no absence of expression. Neck Global Assessment - no abnormal movements, no bruit auscultated on the right, no bruit auscultated on the left, no decreased range of motion, non-tender. Trachea-midline. Thyroid Gland Characteristics - non-tender.  Eye Eyeball - Left-Extraocular movements intact, No Nystagmus. Eyeball - Right-Extraocular movements intact, No Nystagmus. Cornea - Left-No Hazy. Cornea - Right-No Hazy. Sclera/Conjunctiva - Left-No scleral icterus, No Discharge. Sclera/Conjunctiva - Right-No scleral icterus, No Discharge. Pupil - Left-Direct reaction to light normal. Pupil - Right-Direct reaction to light normal.  ENMT Ears Pinna - Left - no drainage observed, no generalized tenderness observed. Right - no drainage observed, no generalized tenderness observed. Nose and Sinuses External Inspection of the Nose - no destructive lesion observed. Inspection of the nares - Left - quiet respiration. Right - quiet respiration. Mouth and Throat Lips - Upper Lip - no fissures observed, no pallor noted. Lower Lip - no fissures observed, no pallor noted. Nasopharynx - no discharge present. Oral Cavity/Oropharynx - Tongue - no dryness observed. Oral Mucosa - no cyanosis observed. Hypopharynx - no evidence of airway distress observed.  Chest and Lung Exam Inspection Movements - Normal and Symmetrical. Accessory muscles - No use of accessory muscles in breathing. Palpation Palpation of the chest reveals - Non-tender. Auscultation Breath sounds - Normal and Clear.  Cardiovascular Auscultation Rhythm - Regular. Murmurs & Other Heart Sounds - Auscultation of the heart reveals - No Murmurs and No Systolic Clicks.  Abdomen Inspection Inspection of the abdomen reveals -  No Visible peristalsis and No Abnormal pulsations. Umbilicus - No Bleeding, No Urine drainage. Palpation/Percussion Palpation and Percussion of the abdomen reveal - Soft, Non Tender, No Rebound tenderness, No Rigidity (guarding) and No Cutaneous hyperesthesia. Note: 2 cm umbilical  mass reduces down to about a 15 mm umbilical hernia. Diastases. Otherwise the abdomen is soft and flat. Nontender. Not distended. No guarding.   Male Genitourinary Sexual Maturity Tanner 5 - Adult hair pattern and Adult penile size and shape. Note: Very large right inguinal hernia filling up the scrotum. Highly suspicious for bowel within it. Ultimately reducible. More subtle impulse in left groin. Testes cords and phallus otherwise normal   Peripheral Vascular Upper Extremity Inspection - Left - No Cyanotic nailbeds, Not Ischemic. Right - No Cyanotic nailbeds, Not Ischemic.  Neurologic Neurologic evaluation reveals -normal attention span and ability to concentrate, able to name objects and repeat phrases. Appropriate fund of knowledge , normal sensation and normal coordination. Mental Status Affect - not angry, not paranoid. Cranial Nerves-Normal Bilaterally. Gait-Normal.  Neuropsychiatric Mental status exam performed with findings of-able to articulate well with normal speech/language, rate, volume and coherence, thought content normal with ability to perform basic computations and apply abstract reasoning and no evidence of hallucinations, delusions, obsessions or homicidal/suicidal ideation.  Musculoskeletal Global Assessment Spine, Ribs and Pelvis - no instability, subluxation or laxity. Right Upper Extremity - no instability, subluxation or laxity. 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  Some chronic skin thickening and changes on bilateral calves. 1 Band-Aid on the left anterior calf. About 1+ edema.   Lymphatic Head & Neck  General Head &  Neck Lymphatics: Bilateral - Description - No Localized lymphadenopathy. Axillary  General Axillary Region: Bilateral - Description - No Localized lymphadenopathy. Femoral & Inguinal  Generalized Femoral & Inguinal Lymphatics: Left - Description - No Localized lymphadenopathy. Right - Description - No Localized lymphadenopathy.    Assessment & Plan SCROTAL HERNIA (K40.90) Impression: Very large right inguinal hernia going down to scrotum. Sensitive reducible. Small impulse on left side.  Standard of care would be repair of hernias with mesh. Laparoscopic underlay repair. Most likely use thicker density mesh with 3 sheets and possibly absorbable tacks given the large hernia size. Have to see how big the defect really is intraoperatively. Usually the other side has a hernia and placing contralateral mesh as well to avoid recurrence. He's had some wound issues but no major ulceration or infection. This resolved manner. I recurrence of be unacceptably high without mesh. Much higher risk of having a postoperative hematoma or seroma usually the scrotal ones will improve over several months.  He is wish to be aggressive and get this fixed. We will work to do an off dialysis day. Usually gets dialysis Tuesday/Thursday/Saturday  Hopefully the umbilical hernia small - just primarily repair.  The anatomy & physiology of the abdominal wall and pelvic floor was discussed. The pathophysiology of hernias in the inguinal and pelvic region was discussed. Natural history risks such as progressive enlargement, pain, incarceration, and strangulation was discussed. Contributors to complications such as smoking, obesity, diabetes, prior surgery, etc were discussed.  I feel the risks of no intervention will lead to serious problems that outweigh the operative risks; therefore, I recommended surgery to reduce and repair the hernia. I explained laparoscopic techniques with possible need for an open  approach. I noted usual use of mesh to patch and/or buttress hernia repair  Risks such as bleeding, infection, abscess, need for further treatment, heart attack, death, and other risks were discussed. I noted a good likelihood this will help address the problem. Goals of post-operative recovery were discussed as well. Possibility that this will not correct all symptoms was explained. I stressed the importance  of low-impact activity, aggressive pain control, avoiding constipation, & not pushing through pain to minimize risk of post-operative chronic pain or injury. Possibility of reherniation was discussed. We will work to minimize complications.  An educational handout further explaining the pathology & treatment options was given as well. Questions were answered. The patient expresses understanding & wishes to proceed with surgery.    Adin Hector, MD, FACS, MASCRS Gastrointestinal and Minimally Invasive Surgery    [address], [place], [place] [address] [phone] Main / Paging [phone] Fax

## 2017-11-18 NOTE — Anesthesia Preprocedure Evaluation (Signed)
Anesthesia Evaluation  Patient identified by MRN, date of birth, ID band Patient awake    Reviewed: Allergy & Precautions, H&P , NPO status , Patient's Chart, lab work & pertinent test results  Airway Mallampati: II   Neck ROM: full    Dental   Pulmonary former smoker,    breath sounds clear to auscultation       Cardiovascular hypertension, +CHF   Rhythm:regular Rate:Normal  LVH. Moderate pulmonary HTN   Neuro/Psych    GI/Hepatic   Endo/Other    Renal/GU ESRF and DialysisRenal disease     Musculoskeletal   Abdominal   Peds  Hematology   Anesthesia Other Findings   Reproductive/Obstetrics                             Anesthesia Physical Anesthesia Plan  ASA: III  Anesthesia Plan: General   Post-op Pain Management:    Induction: Intravenous  PONV Risk Score and Plan: 2 and Ondansetron, Dexamethasone, Midazolam and Treatment may vary due to age or medical condition  Airway Management Planned: Oral ETT  Additional Equipment:   Intra-op Plan:   Post-operative Plan: Extubation in OR  Informed Consent: I have reviewed the patients History and Physical, chart, labs and discussed the procedure including the risks, benefits and alternatives for the proposed anesthesia with the patient or authorized representative who has indicated his/her understanding and acceptance.     Plan Discussed with: CRNA, Anesthesiologist and Surgeon  Anesthesia Plan Comments:         Anesthesia Quick Evaluation

## 2017-11-18 NOTE — Anesthesia Procedure Notes (Signed)
Date/Time: 11/18/2017 12:57 PM Performed by: Cynda Familia, CRNA Oxygen Delivery Method: Simple face mask Placement Confirmation: positive ETCO2 and breath sounds checked- equal and bilateral Dental Injury: Teeth and Oropharynx as per pre-operative assessment

## 2017-11-18 NOTE — Transfer of Care (Signed)
Immediate Anesthesia Transfer of Care Note  Patient: Jason [name].  Procedure(s) Performed: LAPAROSCOPIC BILATERAL INGUINAL HERNIA REPAIR WITH UMBILICAL HERNIA REPAIR WITH MESH (Bilateral ) INSERTION OF MESH (N/A )  Patient Location: PACU  Anesthesia Type:General  Level of Consciousness: sedated  Airway & Oxygen Therapy: Patient Spontanous Breathing and Patient connected to face mask oxygen  Post-op Assessment: Report given to RN and Post -op Vital signs reviewed and stable  Post vital signs: Reviewed and stable  Last Vitals:  Vitals Value Taken Time  BP 143/65 11/18/2017  1:01 PM  Temp 37.1 C 11/18/2017  1:00 PM  Pulse 60 11/18/2017  1:12 PM  Resp 20 11/18/2017  1:12 PM  SpO2 100 % 11/18/2017  1:12 PM  Vitals shown include unvalidated device data.  Last Pain:  Vitals:   11/18/17 1300  TempSrc:   PainSc: Asleep      Patients Stated Pain Goal: 4 (20/35/59 7416)  Complications: No apparent anesthesia complications

## 2017-11-18 NOTE — Progress Notes (Signed)
Dr Marcie Bal aware pt ready for tx to phase 2, discharge home.

## 2017-11-18 NOTE — Anesthesia Procedure Notes (Signed)
Procedure Name: Intubation Date/Time: 11/18/2017 10:25 AM Performed by: Cynda Familia, CRNA Pre-anesthesia Checklist: Patient identified, Emergency Drugs available, Suction available and Patient being monitored Patient Re-evaluated:Patient Re-evaluated prior to induction Oxygen Delivery Method: Circle System Utilized Preoxygenation: Pre-oxygenation with 100% oxygen Induction Type: IV induction Ventilation: Mask ventilation without difficulty Laryngoscope Size: Miller and 2 Grade View: Grade I Tube type: Oral Number of attempts: 1 Airway Equipment and Method: Stylet and Oral airway Placement Confirmation: ETT inserted through vocal cords under direct vision,  positive ETCO2 and breath sounds checked- equal and bilateral Secured at: 23 cm Tube secured with: Tape Dental Injury: Teeth and Oropharynx as per pre-operative assessment  Comments: Smooth IV induction Hodierne-- intubation AM CRNA atraumatic-- teeth and mouth as preop- dental advisory given in preop-- bilat BS

## 2017-11-18 NOTE — Discharge Instructions (Signed)
HERNIA REPAIR: POST OP INSTRUCTIONS ° °###################################################################### ° °EAT °Gradually transition to a high fiber diet with a fiber supplement over the next few weeks after discharge.  Start with a pureed / full liquid diet (see below) ° °WALK °Walk an hour a day.  Control your pain to do that.   ° °CONTROL PAIN °Control pain so that you can walk, sleep, tolerate sneezing/coughing, and go up/down stairs. ° °HAVE A BOWEL MOVEMENT DAILY °Keep your bowels regular to avoid problems.  OK to try a laxative to override constipation.  OK to use an antidairrheal to slow down diarrhea.  Call if not better after 2 tries ° °CALL IF YOU HAVE PROBLEMS/CONCERNS °Call if you are still struggling despite following these instructions. °Call if you have concerns not answered by these instructions ° °###################################################################### ° ° ° °1. DIET: Follow a light bland diet the first 24 hours after arrival home, such as soup, liquids, crackers, etc.  Be sure to include lots of fluids daily.  Advance to a low fat / high fiber diet over the next few days after surgery.  Avoid fast food or heavy meals the first week as your are more likely to get nauseated.   ° °2. Take your usually prescribed home medications unless otherwise directed. ° °3. PAIN CONTROL: °a. Pain is best controlled by a usual combination of three different methods TOGETHER: °i. Ice/Heat °ii. Over the counter pain medication °iii. Prescription pain medication °b. Most patients will experience some swelling and bruising around the hernia(s) such as the bellybutton, groins, or old incisions.  Ice packs or heating pads (30-60 minutes up to 6 times a day) will help. Use ice for the first few days to help decrease swelling and bruising, then switch to heat to help relax tight/sore spots and speed recovery.  Some people prefer to use ice alone, heat alone, alternating between ice & heat.  Experiment  to what works for you.  Swelling and bruising can take several weeks to resolve.   °c. It is helpful to take an over-the-counter pain medication regularly for the first few weeks.  Choose one of the following that works best for you: °i. Naproxen (Aleve, etc)  Two 220mg tabs twice a day °ii. Ibuprofen (Advil, etc) Three 200mg tabs four times a day (every meal & bedtime) °iii. Acetaminophen (Tylenol, etc) 325-650mg four times a day (every meal & bedtime) °d. A  prescription for pain medication should be given to you upon discharge.  Take your pain medication as prescribed.  °i. If you are having problems/concerns with the prescription medicine (does not control pain, nausea, vomiting, rash, itching, etc), please call us [phone] to see if we need to switch you to a different pain medicine that will work better for you and/or control your side effect better. °ii. If you need a refill on your pain medication, please contact your pharmacy.  They will contact our office to request authorization. Prescriptions will not be filled after 5 pm or on week-ends. ° °4. Avoid getting constipated.  Between the surgery and the pain medications, it is common to experience some constipation.  Increasing fluid intake and taking a fiber supplement (such as Metamucil, Citrucel, FiberCon, MiraLax, etc) 1-2 times a day regularly will usually help prevent this problem from occurring.  A mild laxative (prune juice, Milk of Magnesia, MiraLax, etc) should be taken according to package directions if there are no bowel movements after 48 hours.   ° °5. Wash / shower every   day.  You may shower over the dressings as they are waterproof.   ° °6. Remove your waterproof bandages, skin tapes, and other bandages 5 days after surgery. You may replace a dressing/Band-Aid to cover the incision for comfort if you wish. You may leave the incisions open to air.  You may replace a dressing/Band-Aid to cover an incision for comfort if you wish.   Continue to shower over incision(s) after the dressing is off. ° °7. ACTIVITIES as tolerated:   °a. You may resume regular (light) daily activities beginning the next day--such as daily self-care, walking, climbing stairs--gradually increasing activities as tolerated.  Control your pain so that you can walk an hour a day.  If you can walk 30 minutes without difficulty, it is safe to try more intense activity such as jogging, treadmill, bicycling, low-impact aerobics, swimming, etc. °b. Save the most intensive and strenuous activity for last such as sit-ups, heavy lifting, contact sports, etc  Refrain from any heavy lifting or straining until you are off narcotics for pain control.   °c. DO NOT PUSH THROUGH PAIN.  Let pain be your guide: If it hurts to do something, don't do it.  Pain is your body warning you to avoid that activity for another week until the pain goes down. °d. You may drive when you are no longer taking prescription pain medication, you can comfortably wear a seatbelt, and you can safely maneuver your car and apply brakes. °e. You may have sexual intercourse when it is comfortable.  ° °8. FOLLOW UP in our office °a. Please call [facility] at [phone] to set up an appointment to see your surgeon in the office for a follow-up appointment approximately 2-3 weeks after your surgery. °b. Make sure that you call for this appointment the day you arrive home to insure a convenient appointment time. ° °9.  If you have disability of FMLA / Family leave forms, please bring the forms to the office for processing.  (do not give to your surgeon). ° °WHEN TO CALL US [phone]: °1. Poor pain control °2. Reactions / problems with new medications (rash/itching, nausea, etc)  °3. Fever over 101.5 F (38.5 C) °4. Inability to urinate °5. Nausea and/or vomiting °6. Worsening swelling or bruising °7. Continued bleeding from incision. °8. Increased pain, redness, or drainage from the incision ° ° The clinic staff is  available to answer your questions during regular business hours (8:30am-5pm).  Please don’t hesitate to call and ask to speak to one of our nurses for clinical concerns.  ° If you have a medical emergency, go to the nearest emergency room or call 911. ° A surgeon from [facility] is always on call at the hospitals in [place] ° °[facility] °[address], [address], [place], [place]  [address] ? ° [address] [address], [place], [place]   [address] °MAIN: [phone] ? TOLL FREE: [phone] ? FAX: [phone] °[email or web address] ° °

## 2017-11-19 DIAGNOSIS — N186 End stage renal disease: Secondary | ICD-10-CM | POA: Diagnosis not present

## 2017-11-19 DIAGNOSIS — D509 Iron deficiency anemia, unspecified: Secondary | ICD-10-CM | POA: Diagnosis not present

## 2017-11-19 DIAGNOSIS — D631 Anemia in chronic kidney disease: Secondary | ICD-10-CM | POA: Diagnosis not present

## 2017-11-19 DIAGNOSIS — N2581 Secondary hyperparathyroidism of renal origin: Secondary | ICD-10-CM | POA: Diagnosis not present

## 2017-11-19 DIAGNOSIS — Z992 Dependence on renal dialysis: Secondary | ICD-10-CM | POA: Diagnosis not present

## 2017-11-22 ENCOUNTER — Ambulatory Visit (INDEPENDENT_AMBULATORY_CARE_PROVIDER_SITE_OTHER): Payer: Medicare Other | Admitting: Family Medicine

## 2017-11-22 ENCOUNTER — Encounter (HOSPITAL_COMMUNITY): Payer: Self-pay | Admitting: Surgery

## 2017-11-22 ENCOUNTER — Ambulatory Visit: Payer: Self-pay | Admitting: Family Medicine

## 2017-11-22 VITALS — BP 110/78 | HR 68 | Temp 98.2°F | Ht 74.0 in | Wt 184.0 lb

## 2017-11-22 DIAGNOSIS — I10 Essential (primary) hypertension: Secondary | ICD-10-CM

## 2017-11-22 DIAGNOSIS — Z09 Encounter for follow-up examination after completed treatment for conditions other than malignant neoplasm: Secondary | ICD-10-CM

## 2017-11-22 DIAGNOSIS — Z992 Dependence on renal dialysis: Secondary | ICD-10-CM | POA: Diagnosis not present

## 2017-11-22 DIAGNOSIS — N186 End stage renal disease: Secondary | ICD-10-CM

## 2017-11-22 DIAGNOSIS — D509 Iron deficiency anemia, unspecified: Secondary | ICD-10-CM | POA: Diagnosis not present

## 2017-11-22 DIAGNOSIS — D631 Anemia in chronic kidney disease: Secondary | ICD-10-CM | POA: Diagnosis not present

## 2017-11-22 DIAGNOSIS — N2581 Secondary hyperparathyroidism of renal origin: Secondary | ICD-10-CM | POA: Diagnosis not present

## 2017-11-22 LAB — POCT URINALYSIS DIP (MANUAL ENTRY)
Bilirubin, UA: NEGATIVE
Glucose, UA: NEGATIVE mg/dL
Ketones, POC UA: NEGATIVE mg/dL
Leukocytes, UA: NEGATIVE
Nitrite, UA: NEGATIVE
Protein Ur, POC: 100 mg/dL — AB
Spec Grav, UA: 1.02 (ref 1.010–1.025)
Urobilinogen, UA: 0.2 E.U./dL
pH, UA: 5 (ref 5.0–8.0)

## 2017-11-22 NOTE — Progress Notes (Signed)
Follow Up  Subjective:    Patient ID: Jason [name]., male    DOB: [birth year], 57 y.o.   MRN: [id number]   Chief Complaint  Patient presents with  . Follow-up    3 month on chronic condition    HPI  Mr. Fofana is a 57 year old male with a past medical history of Venous Stasis Ulcer, Pulmonary Hypertension, Pneumonia, Hypertension, ESRD, CHF, A-V Fistula, Cellulitis, Anemia, Ansasarca, AKI, and Abdominal and Inguial Hernias. He is here today for follow up.    Current Status: Since his last office visit, he is doing well with no complaints. He is S/p: Triple hernia surgery 11/18/2017. He tolerated surgery well with no complications. He will follow up with Dr. Johney Maine in 2 weeks. He has resumed Dialysis treatments on T, R, Sat. He is tolerated treatments well. He states that he my be transitioning to home dialysis treatments. He denies visual changes, chest pain, cough, shortness of breath, heart palpitations, and falls. He has occasionally headaches and dizziness with position changes. Denies severe headaches, confusion, seizures, double vision, and blurred vision, nausea and vomiting.   He denies fevers, chills, recent infections, weight loss, and night sweats. No reports of GI problems such as diarrhea, and constipation. He has no reports of blood in stools, dysuria and hematuria. No depression or anxiety reported. He denies pain today.   Review of Systems  Constitutional: Negative.   HENT: Negative.   Eyes: Negative.   Respiratory: Negative.   Cardiovascular: Negative.   Gastrointestinal: Negative.   Genitourinary:       Currently on Dialysis treatments T, R, and Sat  Musculoskeletal: Negative.   Allergic/Immunologic: Negative.   Neurological: Positive for dizziness and headaches.  Hematological: Negative.   Psychiatric/Behavioral: Negative.    Objective:   Physical Exam  Constitutional: He is oriented to person, place, and time. He appears well-developed and well-nourished.     HENT:  Head: Normocephalic and atraumatic.  Eyes: Pupils are equal, round, and reactive to light. Conjunctivae and EOM are normal.  Neck: Normal range of motion. Neck supple.  Cardiovascular: Normal rate, regular rhythm, normal heart sounds and intact distal pulses.  Pulmonary/Chest: Effort normal and breath sounds normal.  Abdominal: Soft. Bowel sounds are normal.  Musculoskeletal: Normal range of motion.  Neurological: He is alert and oriented to person, place, and time.  Skin: Skin is warm.  Psychiatric: He has a normal mood and affect. His behavior is normal. Judgment and thought content normal.  Nursing note and vitals reviewed.  Assessment & Plan:   1. Essential hypertension Antihypertensive medications are effective. Blood pressure is 110/78 today. He will continue Amlodipine, Carvedilol, and Isosorbide as prescribed. He will continue to decrease high sodium intake, excessive alcohol intake, increase potassium intake, smoking cessation, and increase physical activity of at least 30 minutes of cardio activity daily. He will continue to follow Heart Healthy or DASH diet.  2. ESRD (end stage renal disease) ([place]) He will continue Dialysis treatments on T, Thursday, and Saturdays.   3. Has received maintenance renal dialysis treatments ([place])  4. Accelerated hypertension Stable today.   5. Follow up He will follow up in 6 months.  - POCT urinalysis dipstick  No orders of the defined types were placed in this encounter.   Kathe Becton,  MSN, FNP-C [facility] [address] [place], [place] [address] [phone]

## 2017-11-23 ENCOUNTER — Telehealth: Payer: Self-pay

## 2017-11-23 NOTE — Telephone Encounter (Signed)
Patient will come in tomorrow to have his ears cleaned

## 2017-11-24 DIAGNOSIS — N2581 Secondary hyperparathyroidism of renal origin: Secondary | ICD-10-CM | POA: Diagnosis not present

## 2017-11-24 DIAGNOSIS — Z992 Dependence on renal dialysis: Secondary | ICD-10-CM | POA: Diagnosis not present

## 2017-11-24 DIAGNOSIS — D631 Anemia in chronic kidney disease: Secondary | ICD-10-CM | POA: Diagnosis not present

## 2017-11-24 DIAGNOSIS — N186 End stage renal disease: Secondary | ICD-10-CM | POA: Diagnosis not present

## 2017-11-24 DIAGNOSIS — D509 Iron deficiency anemia, unspecified: Secondary | ICD-10-CM | POA: Diagnosis not present

## 2017-11-26 DIAGNOSIS — N2581 Secondary hyperparathyroidism of renal origin: Secondary | ICD-10-CM | POA: Diagnosis not present

## 2017-11-26 DIAGNOSIS — D631 Anemia in chronic kidney disease: Secondary | ICD-10-CM | POA: Diagnosis not present

## 2017-11-26 DIAGNOSIS — N186 End stage renal disease: Secondary | ICD-10-CM | POA: Diagnosis not present

## 2017-11-26 DIAGNOSIS — Z992 Dependence on renal dialysis: Secondary | ICD-10-CM | POA: Diagnosis not present

## 2017-11-26 DIAGNOSIS — D509 Iron deficiency anemia, unspecified: Secondary | ICD-10-CM | POA: Diagnosis not present

## 2017-11-28 NOTE — Progress Notes (Signed)
HPI: Follow-up cardiomyopathy and congestive heart failure.  Patient seen January 2019 with elevated troponin.  Echocardiogram showed ejection fraction 35 to 40%, moderate diastolic dysfunction, mild mitral regurgitation, severe biatrial enlargement, mild to moderate tricuspid regurgitation and moderate pulmonary hypertension.  Cardiomyopathy felt likely secondary to uncontrolled hypertension.  He was also noted to have nonsustained ventricular tachycardia.  Cardiac catheterization was recommended but patient declined.  Treated with medications.  Follow-up echocardiogram August 2019 showed ejection fraction 50 to 55%, moderate right ventricular enlargement.  Abdominal CT October 2019 showed partially imaged mediastinal mass and follow-up CT with contrast recommended.  Patient is being evaluated for renal transplant at [facility].  Since last seen, he denies dyspnea, chest pain or palpitations.  He is having difficulties with low blood pressure and orthostatic symptoms.  Current Outpatient Medications  Medication Sig Dispense Refill  . ALOE VERA JUICE LIQD Take 30 mLs by mouth daily.    Marland Kitchen amLODipine (NORVASC) 10 MG tablet Take 1 tablet (10 mg total) by mouth daily. 90 tablet 1  . calcium acetate (PHOSLO) 667 MG capsule Take 1 capsule (667 mg total) by mouth 3 (three) times daily with meals. 90 capsule 1  . carvedilol (COREG) 25 MG tablet Take 1 tablet (25 mg total) by mouth 2 (two) times daily with a meal. 60 tablet 2  . isosorbide mononitrate (IMDUR) 60 MG 24 hr tablet Take 1 tablet (60 mg total) by mouth daily. 90 tablet 1  . lidocaine-prilocaine (EMLA) cream Apply 1 application topically See admin instructions. Apply 1-2 hours before dialysis. Cover with occlusive dressing 3 days weekly (Tuesday, Thursday, & Saturdays)  5  . multivitamin (RENA-VIT) TABS tablet Take 1 tablet by mouth daily.  11  . tetrahydrozoline 0.05 % ophthalmic solution Place 1 drop into both eyes daily.    . traMADol  (ULTRAM) 50 MG tablet Take 1-2 tablets (50-100 mg total) by mouth every 6 (six) hours as needed for moderate pain or severe pain. 30 tablet 0  . UNABLE TO FIND Take 1 capsule by mouth 3 (three) times daily. Circu Flow-- herbal supplement     No current facility-administered medications for this visit.      Past Medical History:  Diagnosis Date  . A-V fistula (HCC)    Right arm  . Abdominal hernia   . AKI (acute kidney injury) ([place]) 01/2015  . Anasarca   . Anemia   . Bilateral inguinal hernia   . Bilateral pleural effusion 01/2017  . Cellulitis 01/16/2017   Bilateral lower extremity  . CHF (congestive heart failure) ([place])   . Chronic venous insufficiency   . Concentric left ventricular hypertrophy 08/17/2017   Moderated, noted on ECHO  . Diastolic dysfunction 08/67/6195   noted on ECHO  . Elevated LFTs    per patient, resolved  . End stage renal disease [facility])    Dialysis T/Th/Sa  Started 02/2017  . Hypertension   . Pneumonia   . Pulmonary hypertension (HCC)    Moderate  . Venous stasis ulcer ([place])     Past Surgical History:  Procedure Laterality Date  . AV FISTULA PLACEMENT Right 01/20/2017   Procedure: ARTERIOVENOUS (AV) FISTULA CREATION;  Surgeon: Angelia Mould, MD;  Location: [facility];  Service: Vascular;  Laterality: Right;  . INSERTION OF DIALYSIS CATHETER Right 01/20/2017   Procedure: INSERTION OF DIALYSIS CATHETER;  Surgeon: Angelia Mould, MD;  Location: [facility];  Service: Vascular;  Laterality: Right;  . INSERTION OF MESH N/A  11/18/2017   Procedure: INSERTION OF MESH;  Surgeon: Michael Boston, MD;  Location: [facility];  Service: General;  Laterality: N/A;  . IR FLUORO GUIDE CV LINE RIGHT  01/18/2017  . IR US GUIDE VASC ACCESS RIGHT  01/18/2017  . LAPAROSCOPIC INGUINAL HERNIA WITH UMBILICAL HERNIA Bilateral 11/18/2017   Procedure: LAPAROSCOPIC BILATERAL INGUINAL HERNIA REPAIR WITH UMBILICAL HERNIA REPAIR WITH MESH;  Surgeon: Michael Boston, MD;  Location: [facility];   Service: General;  Laterality: Bilateral;  . TOE SURGERY    . TONSILLECTOMY      Social History   Socioeconomic History  . Marital status: Single    Spouse name: Not on file  . Number of children: Not on file  . Years of education: Not on file  . Highest education level: Not on file  Occupational History  . Not on file  Social Needs  . Financial resource strain: Not on file  . Food insecurity:    Worry: Not on file    Inability: Not on file  . Transportation needs:    Medical: Not on file    Non-medical: Not on file  Tobacco Use  . Smoking status: Former Smoker    Years: 17.00  . Smokeless tobacco: Never Used  . Tobacco comment: quit smoking in 1997  Substance and Sexual Activity  . Alcohol use: Yes    Comment: social  . Drug use: Not Currently    Comment: marijuana  . Sexual activity: Not on file  Lifestyle  . Physical activity:    Days per week: Not on file    Minutes per session: Not on file  . Stress: Not on file  Relationships  . Social connections:    Talks on phone: Not on file    Gets together: Not on file    Attends religious service: Not on file    Active member of club or organization: Not on file    Attends meetings of clubs or organizations: Not on file    Relationship status: Not on file  . Intimate partner violence:    Fear of current or ex partner: Not on file    Emotionally abused: Not on file    Physically abused: Not on file    Forced sexual activity: Not on file  Other Topics Concern  . Not on file  Social History Narrative  . Not on file    Family History  Problem Relation Age of Onset  . Heart attack Mother 79       CABG hx  . Heart disease Mother   . Hypertension Mother   . Healthy Father   . Healthy Brother     ROS: no fevers or chills, productive cough, hemoptysis, dysphasia, odynophagia, melena, hematochezia, dysuria, hematuria, rash, seizure activity, orthopnea, PND, pedal edema, claudication. Remaining systems are  negative.  Physical Exam: Well-developed well-nourished in no acute distress.  Skin is warm and dry.  HEENT is normal.  Neck is supple.  Chest is clear to auscultation with normal expansion.  Cardiovascular exam is regular rate and rhythm.  Abdominal exam nontender or distended. No masses palpated. Extremities show no edema.  AV fistula right upper extremity neuro grossly intact   A/P  1 chronic diastolic congestive heart failure-patient is now on dialysis and blood pressure/volume status is much improved.    2 preoperative evaluation prior to renal transplant-patient is scheduled for a functional study at [facility].  3 hypertension-patient's blood pressure is now low following initiation of dialysis.  He has orthostatic symptoms as well.  I will discontinue isosorbide and continue carvedilol and amlodipine.  If symptoms persist we will decrease or discontinue amlodipine.  4 Mediastinal mass/lung mass-patient had a CT last week at [facility] showing mediastinal cyst but also lung nodule.  I have asked him to follow-up at [facility] for this issue.  Kirk Ruths, MD

## 2017-11-29 DIAGNOSIS — D631 Anemia in chronic kidney disease: Secondary | ICD-10-CM | POA: Diagnosis not present

## 2017-11-29 DIAGNOSIS — N2581 Secondary hyperparathyroidism of renal origin: Secondary | ICD-10-CM | POA: Diagnosis not present

## 2017-11-29 DIAGNOSIS — D509 Iron deficiency anemia, unspecified: Secondary | ICD-10-CM | POA: Diagnosis not present

## 2017-11-29 DIAGNOSIS — N186 End stage renal disease: Secondary | ICD-10-CM | POA: Diagnosis not present

## 2017-11-29 DIAGNOSIS — Z992 Dependence on renal dialysis: Secondary | ICD-10-CM | POA: Diagnosis not present

## 2017-12-01 DIAGNOSIS — D631 Anemia in chronic kidney disease: Secondary | ICD-10-CM | POA: Diagnosis not present

## 2017-12-01 DIAGNOSIS — D509 Iron deficiency anemia, unspecified: Secondary | ICD-10-CM | POA: Diagnosis not present

## 2017-12-01 DIAGNOSIS — Z992 Dependence on renal dialysis: Secondary | ICD-10-CM | POA: Diagnosis not present

## 2017-12-01 DIAGNOSIS — N2581 Secondary hyperparathyroidism of renal origin: Secondary | ICD-10-CM | POA: Diagnosis not present

## 2017-12-01 DIAGNOSIS — N186 End stage renal disease: Secondary | ICD-10-CM | POA: Diagnosis not present

## 2017-12-03 DIAGNOSIS — D631 Anemia in chronic kidney disease: Secondary | ICD-10-CM | POA: Diagnosis not present

## 2017-12-03 DIAGNOSIS — N2581 Secondary hyperparathyroidism of renal origin: Secondary | ICD-10-CM | POA: Diagnosis not present

## 2017-12-03 DIAGNOSIS — D509 Iron deficiency anemia, unspecified: Secondary | ICD-10-CM | POA: Diagnosis not present

## 2017-12-03 DIAGNOSIS — Z992 Dependence on renal dialysis: Secondary | ICD-10-CM | POA: Diagnosis not present

## 2017-12-03 DIAGNOSIS — N186 End stage renal disease: Secondary | ICD-10-CM | POA: Diagnosis not present

## 2017-12-05 ENCOUNTER — Ambulatory Visit (INDEPENDENT_AMBULATORY_CARE_PROVIDER_SITE_OTHER): Payer: Medicare Other | Admitting: Cardiology

## 2017-12-05 ENCOUNTER — Encounter: Payer: Self-pay | Admitting: Cardiology

## 2017-12-05 VITALS — BP 120/68 | HR 60 | Ht 74.0 in | Wt 193.6 lb

## 2017-12-05 DIAGNOSIS — I5032 Chronic diastolic (congestive) heart failure: Secondary | ICD-10-CM | POA: Diagnosis not present

## 2017-12-05 DIAGNOSIS — I10 Essential (primary) hypertension: Secondary | ICD-10-CM

## 2017-12-05 DIAGNOSIS — I428 Other cardiomyopathies: Secondary | ICD-10-CM

## 2017-12-05 NOTE — Patient Instructions (Signed)
Medication Instructions:  STOP ISOSORBIDE If you need a refill on your cardiac medications before your next appointment, please call your pharmacy.   Lab work: If you have labs (blood work) drawn today and your tests are completely normal, you will receive your results only by: Marland Kitchen MyChart Message (if you have MyChart) OR . A paper copy in the mail If you have any lab test that is abnormal or we need to change your treatment, we will call you to review the results.  Follow-Up: At [facility], you and your health needs are our priority.  As part of our continuing mission to provide you with exceptional heart care, we have created designated Provider Care Teams.  These Care Teams include your primary Cardiologist (physician) and Advanced Practice Providers (APPs -  Physician Assistants and Nurse Practitioners) who all work together to provide you with the care you need, when you need it. You will need a follow up appointment in 6 months.  Please call our office 2 months in advance to schedule this appointment.  You may see Kirk Ruths, MD or one of the following Advanced Practice Providers on your designated Care Team:   Kerin Ransom, PA-C Roby Lofts, [place] . Sande Rives, PA-C

## 2017-12-06 DIAGNOSIS — Z992 Dependence on renal dialysis: Secondary | ICD-10-CM | POA: Diagnosis not present

## 2017-12-06 DIAGNOSIS — D509 Iron deficiency anemia, unspecified: Secondary | ICD-10-CM | POA: Diagnosis not present

## 2017-12-06 DIAGNOSIS — N186 End stage renal disease: Secondary | ICD-10-CM | POA: Diagnosis not present

## 2017-12-06 DIAGNOSIS — D631 Anemia in chronic kidney disease: Secondary | ICD-10-CM | POA: Diagnosis not present

## 2017-12-06 DIAGNOSIS — N2581 Secondary hyperparathyroidism of renal origin: Secondary | ICD-10-CM | POA: Diagnosis not present

## 2017-12-07 DIAGNOSIS — D631 Anemia in chronic kidney disease: Secondary | ICD-10-CM | POA: Diagnosis not present

## 2017-12-07 DIAGNOSIS — Z992 Dependence on renal dialysis: Secondary | ICD-10-CM | POA: Diagnosis not present

## 2017-12-07 DIAGNOSIS — N186 End stage renal disease: Secondary | ICD-10-CM | POA: Diagnosis not present

## 2017-12-07 DIAGNOSIS — D509 Iron deficiency anemia, unspecified: Secondary | ICD-10-CM | POA: Diagnosis not present

## 2017-12-07 DIAGNOSIS — N2581 Secondary hyperparathyroidism of renal origin: Secondary | ICD-10-CM | POA: Diagnosis not present

## 2017-12-10 DIAGNOSIS — N186 End stage renal disease: Secondary | ICD-10-CM | POA: Diagnosis not present

## 2017-12-10 DIAGNOSIS — N2581 Secondary hyperparathyroidism of renal origin: Secondary | ICD-10-CM | POA: Diagnosis not present

## 2017-12-10 DIAGNOSIS — D631 Anemia in chronic kidney disease: Secondary | ICD-10-CM | POA: Diagnosis not present

## 2017-12-10 DIAGNOSIS — D509 Iron deficiency anemia, unspecified: Secondary | ICD-10-CM | POA: Diagnosis not present

## 2017-12-10 DIAGNOSIS — Z992 Dependence on renal dialysis: Secondary | ICD-10-CM | POA: Diagnosis not present

## 2017-12-11 DIAGNOSIS — I129 Hypertensive chronic kidney disease with stage 1 through stage 4 chronic kidney disease, or unspecified chronic kidney disease: Secondary | ICD-10-CM | POA: Diagnosis not present

## 2017-12-11 DIAGNOSIS — N186 End stage renal disease: Secondary | ICD-10-CM | POA: Diagnosis not present

## 2017-12-11 DIAGNOSIS — Z992 Dependence on renal dialysis: Secondary | ICD-10-CM | POA: Diagnosis not present

## 2017-12-13 DIAGNOSIS — D509 Iron deficiency anemia, unspecified: Secondary | ICD-10-CM | POA: Diagnosis not present

## 2017-12-13 DIAGNOSIS — N2581 Secondary hyperparathyroidism of renal origin: Secondary | ICD-10-CM | POA: Diagnosis not present

## 2017-12-13 DIAGNOSIS — Z992 Dependence on renal dialysis: Secondary | ICD-10-CM | POA: Diagnosis not present

## 2017-12-13 DIAGNOSIS — N186 End stage renal disease: Secondary | ICD-10-CM | POA: Diagnosis not present

## 2017-12-15 DIAGNOSIS — N2581 Secondary hyperparathyroidism of renal origin: Secondary | ICD-10-CM | POA: Diagnosis not present

## 2017-12-15 DIAGNOSIS — N186 End stage renal disease: Secondary | ICD-10-CM | POA: Diagnosis not present

## 2017-12-15 DIAGNOSIS — Z992 Dependence on renal dialysis: Secondary | ICD-10-CM | POA: Diagnosis not present

## 2017-12-15 DIAGNOSIS — D509 Iron deficiency anemia, unspecified: Secondary | ICD-10-CM | POA: Diagnosis not present

## 2017-12-17 DIAGNOSIS — Z992 Dependence on renal dialysis: Secondary | ICD-10-CM | POA: Diagnosis not present

## 2017-12-17 DIAGNOSIS — N2581 Secondary hyperparathyroidism of renal origin: Secondary | ICD-10-CM | POA: Diagnosis not present

## 2017-12-17 DIAGNOSIS — D509 Iron deficiency anemia, unspecified: Secondary | ICD-10-CM | POA: Diagnosis not present

## 2017-12-17 DIAGNOSIS — N186 End stage renal disease: Secondary | ICD-10-CM | POA: Diagnosis not present

## 2017-12-20 DIAGNOSIS — Z992 Dependence on renal dialysis: Secondary | ICD-10-CM | POA: Diagnosis not present

## 2017-12-20 DIAGNOSIS — N2581 Secondary hyperparathyroidism of renal origin: Secondary | ICD-10-CM | POA: Diagnosis not present

## 2017-12-20 DIAGNOSIS — N186 End stage renal disease: Secondary | ICD-10-CM | POA: Diagnosis not present

## 2017-12-20 DIAGNOSIS — D509 Iron deficiency anemia, unspecified: Secondary | ICD-10-CM | POA: Diagnosis not present

## 2017-12-21 ENCOUNTER — Telehealth: Payer: Self-pay

## 2017-12-21 MED ORDER — CALCIUM ACETATE (PHOS BINDER) 667 MG PO CAPS: 667.0000 mg | ORAL_CAPSULE | Freq: Three times a day (TID) | ORAL | 1 refills | Status: DC

## 2017-12-21 NOTE — Telephone Encounter (Signed)
Medication sent to pharmacy  

## 2017-12-22 ENCOUNTER — Telehealth: Payer: Self-pay | Admitting: Cardiology

## 2017-12-22 DIAGNOSIS — Z992 Dependence on renal dialysis: Secondary | ICD-10-CM | POA: Diagnosis not present

## 2017-12-22 DIAGNOSIS — N2581 Secondary hyperparathyroidism of renal origin: Secondary | ICD-10-CM | POA: Diagnosis not present

## 2017-12-22 DIAGNOSIS — N186 End stage renal disease: Secondary | ICD-10-CM | POA: Diagnosis not present

## 2017-12-22 DIAGNOSIS — D509 Iron deficiency anemia, unspecified: Secondary | ICD-10-CM | POA: Diagnosis not present

## 2017-12-22 NOTE — Telephone Encounter (Signed)
New message   Pt c/o medication issue:  1. Name of Medication:amLODipine (NORVASC) 10 MG tablet  2. How are you currently taking this medication (dosage and times per day)? 1 time daily  3. Are you having a reaction (difficulty breathing--STAT)? No   4. What is your medication issue?patient states that this medication is lowering his bp too much. Patient states that his bp is around 90/66.  Patient has dizziness, lightheadedness.

## 2017-12-22 NOTE — Telephone Encounter (Signed)
Left message of results for pt  

## 2017-12-23 NOTE — Telephone Encounter (Signed)
Called patient, LVM to call back regarding medication and BP issues.  Left call back number.

## 2017-12-23 NOTE — Telephone Encounter (Signed)
Pt called back. He states that his BP has been running low in the 100's he states that he gets "really dizzy" he states that he feels syncopal even when he gets up to go to the bathroom in the middle of the night. He is on dialysis, hid BP before dialysis is 108/66 after dialysis it "goes down after" BP after 90/60 at this point is when he is dizzy. He takes amlodipine and carvedilol at night and labetalol. He would like to know if he can stop one of his BP medications?  Pt is away from his house right now, he will call back when he returns home and update BP and medication mg's as he does not have these available at this time.

## 2017-12-23 NOTE — Telephone Encounter (Signed)
Left message to call back  

## 2017-12-23 NOTE — Telephone Encounter (Signed)
DC amlodipine and follow BP Jason [name]  

## 2017-12-24 DIAGNOSIS — Z992 Dependence on renal dialysis: Secondary | ICD-10-CM | POA: Diagnosis not present

## 2017-12-24 DIAGNOSIS — D509 Iron deficiency anemia, unspecified: Secondary | ICD-10-CM | POA: Diagnosis not present

## 2017-12-24 DIAGNOSIS — N186 End stage renal disease: Secondary | ICD-10-CM | POA: Diagnosis not present

## 2017-12-24 DIAGNOSIS — N2581 Secondary hyperparathyroidism of renal origin: Secondary | ICD-10-CM | POA: Diagnosis not present

## 2017-12-26 NOTE — Telephone Encounter (Signed)
Spoke with pt, Aware of dr crenshaw's recommendations.  °

## 2017-12-27 DIAGNOSIS — N2581 Secondary hyperparathyroidism of renal origin: Secondary | ICD-10-CM | POA: Diagnosis not present

## 2017-12-27 DIAGNOSIS — Z992 Dependence on renal dialysis: Secondary | ICD-10-CM | POA: Diagnosis not present

## 2017-12-27 DIAGNOSIS — N186 End stage renal disease: Secondary | ICD-10-CM | POA: Diagnosis not present

## 2017-12-27 DIAGNOSIS — D509 Iron deficiency anemia, unspecified: Secondary | ICD-10-CM | POA: Diagnosis not present

## 2017-12-29 DIAGNOSIS — D509 Iron deficiency anemia, unspecified: Secondary | ICD-10-CM | POA: Diagnosis not present

## 2017-12-29 DIAGNOSIS — N186 End stage renal disease: Secondary | ICD-10-CM | POA: Diagnosis not present

## 2017-12-29 DIAGNOSIS — Z992 Dependence on renal dialysis: Secondary | ICD-10-CM | POA: Diagnosis not present

## 2017-12-29 DIAGNOSIS — N2581 Secondary hyperparathyroidism of renal origin: Secondary | ICD-10-CM | POA: Diagnosis not present

## 2018-01-01 DIAGNOSIS — N186 End stage renal disease: Secondary | ICD-10-CM | POA: Diagnosis not present

## 2018-01-01 DIAGNOSIS — N2581 Secondary hyperparathyroidism of renal origin: Secondary | ICD-10-CM | POA: Diagnosis not present

## 2018-01-01 DIAGNOSIS — D509 Iron deficiency anemia, unspecified: Secondary | ICD-10-CM | POA: Diagnosis not present

## 2018-01-01 DIAGNOSIS — Z992 Dependence on renal dialysis: Secondary | ICD-10-CM | POA: Diagnosis not present

## 2018-01-05 DIAGNOSIS — D509 Iron deficiency anemia, unspecified: Secondary | ICD-10-CM | POA: Diagnosis not present

## 2018-01-05 DIAGNOSIS — Z992 Dependence on renal dialysis: Secondary | ICD-10-CM | POA: Diagnosis not present

## 2018-01-05 DIAGNOSIS — N186 End stage renal disease: Secondary | ICD-10-CM | POA: Diagnosis not present

## 2018-01-05 DIAGNOSIS — N2581 Secondary hyperparathyroidism of renal origin: Secondary | ICD-10-CM | POA: Diagnosis not present

## 2018-01-06 DIAGNOSIS — Z992 Dependence on renal dialysis: Secondary | ICD-10-CM | POA: Diagnosis not present

## 2018-01-06 DIAGNOSIS — N186 End stage renal disease: Secondary | ICD-10-CM | POA: Diagnosis not present

## 2018-01-06 DIAGNOSIS — D509 Iron deficiency anemia, unspecified: Secondary | ICD-10-CM | POA: Diagnosis not present

## 2018-01-06 DIAGNOSIS — N2581 Secondary hyperparathyroidism of renal origin: Secondary | ICD-10-CM | POA: Diagnosis not present

## 2018-01-09 DIAGNOSIS — Z992 Dependence on renal dialysis: Secondary | ICD-10-CM | POA: Diagnosis not present

## 2018-01-09 DIAGNOSIS — D509 Iron deficiency anemia, unspecified: Secondary | ICD-10-CM | POA: Diagnosis not present

## 2018-01-09 DIAGNOSIS — N186 End stage renal disease: Secondary | ICD-10-CM | POA: Diagnosis not present

## 2018-01-09 DIAGNOSIS — N2581 Secondary hyperparathyroidism of renal origin: Secondary | ICD-10-CM | POA: Diagnosis not present

## 2018-01-11 DIAGNOSIS — Z992 Dependence on renal dialysis: Secondary | ICD-10-CM | POA: Diagnosis not present

## 2018-01-11 DIAGNOSIS — I129 Hypertensive chronic kidney disease with stage 1 through stage 4 chronic kidney disease, or unspecified chronic kidney disease: Secondary | ICD-10-CM | POA: Diagnosis not present

## 2018-01-11 DIAGNOSIS — N186 End stage renal disease: Secondary | ICD-10-CM | POA: Diagnosis not present

## 2018-01-12 DIAGNOSIS — N186 End stage renal disease: Secondary | ICD-10-CM | POA: Diagnosis not present

## 2018-01-12 DIAGNOSIS — D631 Anemia in chronic kidney disease: Secondary | ICD-10-CM | POA: Diagnosis not present

## 2018-01-12 DIAGNOSIS — N2581 Secondary hyperparathyroidism of renal origin: Secondary | ICD-10-CM | POA: Diagnosis not present

## 2018-01-12 DIAGNOSIS — Z992 Dependence on renal dialysis: Secondary | ICD-10-CM | POA: Diagnosis not present

## 2018-01-16 DIAGNOSIS — Z992 Dependence on renal dialysis: Secondary | ICD-10-CM | POA: Diagnosis not present

## 2018-01-16 DIAGNOSIS — N2581 Secondary hyperparathyroidism of renal origin: Secondary | ICD-10-CM | POA: Diagnosis not present

## 2018-01-16 DIAGNOSIS — D631 Anemia in chronic kidney disease: Secondary | ICD-10-CM | POA: Diagnosis not present

## 2018-01-16 DIAGNOSIS — N186 End stage renal disease: Secondary | ICD-10-CM | POA: Diagnosis not present

## 2018-01-17 DIAGNOSIS — D631 Anemia in chronic kidney disease: Secondary | ICD-10-CM | POA: Diagnosis not present

## 2018-01-17 DIAGNOSIS — N186 End stage renal disease: Secondary | ICD-10-CM | POA: Diagnosis not present

## 2018-01-17 DIAGNOSIS — Z992 Dependence on renal dialysis: Secondary | ICD-10-CM | POA: Diagnosis not present

## 2018-01-17 DIAGNOSIS — N2581 Secondary hyperparathyroidism of renal origin: Secondary | ICD-10-CM | POA: Diagnosis not present

## 2018-01-19 DIAGNOSIS — N2581 Secondary hyperparathyroidism of renal origin: Secondary | ICD-10-CM | POA: Diagnosis not present

## 2018-01-19 DIAGNOSIS — N186 End stage renal disease: Secondary | ICD-10-CM | POA: Diagnosis not present

## 2018-01-19 DIAGNOSIS — D631 Anemia in chronic kidney disease: Secondary | ICD-10-CM | POA: Diagnosis not present

## 2018-01-19 DIAGNOSIS — Z992 Dependence on renal dialysis: Secondary | ICD-10-CM | POA: Diagnosis not present

## 2018-01-21 DIAGNOSIS — Z992 Dependence on renal dialysis: Secondary | ICD-10-CM | POA: Diagnosis not present

## 2018-01-21 DIAGNOSIS — N186 End stage renal disease: Secondary | ICD-10-CM | POA: Diagnosis not present

## 2018-01-21 DIAGNOSIS — D631 Anemia in chronic kidney disease: Secondary | ICD-10-CM | POA: Diagnosis not present

## 2018-01-21 DIAGNOSIS — N2581 Secondary hyperparathyroidism of renal origin: Secondary | ICD-10-CM | POA: Diagnosis not present

## 2018-01-24 DIAGNOSIS — N2581 Secondary hyperparathyroidism of renal origin: Secondary | ICD-10-CM | POA: Diagnosis not present

## 2018-01-24 DIAGNOSIS — Z992 Dependence on renal dialysis: Secondary | ICD-10-CM | POA: Diagnosis not present

## 2018-01-24 DIAGNOSIS — N186 End stage renal disease: Secondary | ICD-10-CM | POA: Diagnosis not present

## 2018-01-24 DIAGNOSIS — D631 Anemia in chronic kidney disease: Secondary | ICD-10-CM | POA: Diagnosis not present

## 2018-01-26 DIAGNOSIS — N2581 Secondary hyperparathyroidism of renal origin: Secondary | ICD-10-CM | POA: Diagnosis not present

## 2018-01-26 DIAGNOSIS — Z992 Dependence on renal dialysis: Secondary | ICD-10-CM | POA: Diagnosis not present

## 2018-01-26 DIAGNOSIS — D631 Anemia in chronic kidney disease: Secondary | ICD-10-CM | POA: Diagnosis not present

## 2018-01-26 DIAGNOSIS — N186 End stage renal disease: Secondary | ICD-10-CM | POA: Diagnosis not present

## 2018-01-28 DIAGNOSIS — N186 End stage renal disease: Secondary | ICD-10-CM | POA: Diagnosis not present

## 2018-01-28 DIAGNOSIS — Z992 Dependence on renal dialysis: Secondary | ICD-10-CM | POA: Diagnosis not present

## 2018-01-28 DIAGNOSIS — N2581 Secondary hyperparathyroidism of renal origin: Secondary | ICD-10-CM | POA: Diagnosis not present

## 2018-01-28 DIAGNOSIS — D631 Anemia in chronic kidney disease: Secondary | ICD-10-CM | POA: Diagnosis not present

## 2018-01-30 DIAGNOSIS — T82858A Stenosis of vascular prosthetic devices, implants and grafts, initial encounter: Secondary | ICD-10-CM | POA: Diagnosis not present

## 2018-01-30 DIAGNOSIS — Z992 Dependence on renal dialysis: Secondary | ICD-10-CM | POA: Diagnosis not present

## 2018-01-30 DIAGNOSIS — I871 Compression of vein: Secondary | ICD-10-CM | POA: Diagnosis not present

## 2018-01-30 DIAGNOSIS — N186 End stage renal disease: Secondary | ICD-10-CM | POA: Diagnosis not present

## 2018-01-31 DIAGNOSIS — Z992 Dependence on renal dialysis: Secondary | ICD-10-CM | POA: Diagnosis not present

## 2018-01-31 DIAGNOSIS — L853 Xerosis cutis: Secondary | ICD-10-CM | POA: Diagnosis not present

## 2018-01-31 DIAGNOSIS — N2581 Secondary hyperparathyroidism of renal origin: Secondary | ICD-10-CM | POA: Diagnosis not present

## 2018-01-31 DIAGNOSIS — L910 Hypertrophic scar: Secondary | ICD-10-CM | POA: Diagnosis not present

## 2018-01-31 DIAGNOSIS — N186 End stage renal disease: Secondary | ICD-10-CM | POA: Diagnosis not present

## 2018-01-31 DIAGNOSIS — L918 Other hypertrophic disorders of the skin: Secondary | ICD-10-CM | POA: Diagnosis not present

## 2018-01-31 DIAGNOSIS — R208 Other disturbances of skin sensation: Secondary | ICD-10-CM | POA: Diagnosis not present

## 2018-01-31 DIAGNOSIS — D631 Anemia in chronic kidney disease: Secondary | ICD-10-CM | POA: Diagnosis not present

## 2018-01-31 DIAGNOSIS — D485 Neoplasm of uncertain behavior of skin: Secondary | ICD-10-CM | POA: Diagnosis not present

## 2018-02-02 ENCOUNTER — Telehealth: Payer: Self-pay

## 2018-02-02 MED ORDER — CARVEDILOL 25 MG PO TABS: 25.0000 mg | ORAL_TABLET | Freq: Two times a day (BID) | ORAL | 2 refills | Status: DC

## 2018-02-02 NOTE — Telephone Encounter (Signed)
Medication sent to pharmacy  

## 2018-02-03 DIAGNOSIS — D631 Anemia in chronic kidney disease: Secondary | ICD-10-CM | POA: Diagnosis not present

## 2018-02-03 DIAGNOSIS — N186 End stage renal disease: Secondary | ICD-10-CM | POA: Diagnosis not present

## 2018-02-03 DIAGNOSIS — N2581 Secondary hyperparathyroidism of renal origin: Secondary | ICD-10-CM | POA: Diagnosis not present

## 2018-02-03 DIAGNOSIS — Z992 Dependence on renal dialysis: Secondary | ICD-10-CM | POA: Diagnosis not present

## 2018-02-04 DIAGNOSIS — D631 Anemia in chronic kidney disease: Secondary | ICD-10-CM | POA: Diagnosis not present

## 2018-02-04 DIAGNOSIS — N2581 Secondary hyperparathyroidism of renal origin: Secondary | ICD-10-CM | POA: Diagnosis not present

## 2018-02-04 DIAGNOSIS — N186 End stage renal disease: Secondary | ICD-10-CM | POA: Diagnosis not present

## 2018-02-04 DIAGNOSIS — Z992 Dependence on renal dialysis: Secondary | ICD-10-CM | POA: Diagnosis not present

## 2018-02-07 DIAGNOSIS — N186 End stage renal disease: Secondary | ICD-10-CM | POA: Diagnosis not present

## 2018-02-07 DIAGNOSIS — D631 Anemia in chronic kidney disease: Secondary | ICD-10-CM | POA: Diagnosis not present

## 2018-02-07 DIAGNOSIS — N2581 Secondary hyperparathyroidism of renal origin: Secondary | ICD-10-CM | POA: Diagnosis not present

## 2018-02-07 DIAGNOSIS — Z992 Dependence on renal dialysis: Secondary | ICD-10-CM | POA: Diagnosis not present

## 2018-02-09 DIAGNOSIS — N2581 Secondary hyperparathyroidism of renal origin: Secondary | ICD-10-CM | POA: Diagnosis not present

## 2018-02-09 DIAGNOSIS — N186 End stage renal disease: Secondary | ICD-10-CM | POA: Diagnosis not present

## 2018-02-09 DIAGNOSIS — D631 Anemia in chronic kidney disease: Secondary | ICD-10-CM | POA: Diagnosis not present

## 2018-02-09 DIAGNOSIS — Z992 Dependence on renal dialysis: Secondary | ICD-10-CM | POA: Diagnosis not present

## 2018-02-11 DIAGNOSIS — N2581 Secondary hyperparathyroidism of renal origin: Secondary | ICD-10-CM | POA: Diagnosis not present

## 2018-02-11 DIAGNOSIS — I129 Hypertensive chronic kidney disease with stage 1 through stage 4 chronic kidney disease, or unspecified chronic kidney disease: Secondary | ICD-10-CM | POA: Diagnosis not present

## 2018-02-11 DIAGNOSIS — Z992 Dependence on renal dialysis: Secondary | ICD-10-CM | POA: Diagnosis not present

## 2018-02-11 DIAGNOSIS — D631 Anemia in chronic kidney disease: Secondary | ICD-10-CM | POA: Diagnosis not present

## 2018-02-11 DIAGNOSIS — N186 End stage renal disease: Secondary | ICD-10-CM | POA: Diagnosis not present

## 2018-02-14 DIAGNOSIS — D631 Anemia in chronic kidney disease: Secondary | ICD-10-CM | POA: Diagnosis not present

## 2018-02-14 DIAGNOSIS — N2581 Secondary hyperparathyroidism of renal origin: Secondary | ICD-10-CM | POA: Diagnosis not present

## 2018-02-14 DIAGNOSIS — Z992 Dependence on renal dialysis: Secondary | ICD-10-CM | POA: Diagnosis not present

## 2018-02-14 DIAGNOSIS — N186 End stage renal disease: Secondary | ICD-10-CM | POA: Diagnosis not present

## 2018-02-16 DIAGNOSIS — Z992 Dependence on renal dialysis: Secondary | ICD-10-CM | POA: Diagnosis not present

## 2018-02-16 DIAGNOSIS — N186 End stage renal disease: Secondary | ICD-10-CM | POA: Diagnosis not present

## 2018-02-16 DIAGNOSIS — N2581 Secondary hyperparathyroidism of renal origin: Secondary | ICD-10-CM | POA: Diagnosis not present

## 2018-02-16 DIAGNOSIS — D631 Anemia in chronic kidney disease: Secondary | ICD-10-CM | POA: Diagnosis not present

## 2018-02-18 DIAGNOSIS — Z992 Dependence on renal dialysis: Secondary | ICD-10-CM | POA: Diagnosis not present

## 2018-02-18 DIAGNOSIS — N186 End stage renal disease: Secondary | ICD-10-CM | POA: Diagnosis not present

## 2018-02-18 DIAGNOSIS — D631 Anemia in chronic kidney disease: Secondary | ICD-10-CM | POA: Diagnosis not present

## 2018-02-18 DIAGNOSIS — N2581 Secondary hyperparathyroidism of renal origin: Secondary | ICD-10-CM | POA: Diagnosis not present

## 2018-02-21 ENCOUNTER — Encounter: Payer: Self-pay | Admitting: Gastroenterology

## 2018-02-22 DIAGNOSIS — Z992 Dependence on renal dialysis: Secondary | ICD-10-CM | POA: Diagnosis not present

## 2018-02-22 DIAGNOSIS — N2581 Secondary hyperparathyroidism of renal origin: Secondary | ICD-10-CM | POA: Diagnosis not present

## 2018-02-22 DIAGNOSIS — D631 Anemia in chronic kidney disease: Secondary | ICD-10-CM | POA: Diagnosis not present

## 2018-02-22 DIAGNOSIS — N186 End stage renal disease: Secondary | ICD-10-CM | POA: Diagnosis not present

## 2018-02-23 DIAGNOSIS — N186 End stage renal disease: Secondary | ICD-10-CM | POA: Diagnosis not present

## 2018-02-23 DIAGNOSIS — D631 Anemia in chronic kidney disease: Secondary | ICD-10-CM | POA: Diagnosis not present

## 2018-02-23 DIAGNOSIS — N2581 Secondary hyperparathyroidism of renal origin: Secondary | ICD-10-CM | POA: Diagnosis not present

## 2018-02-23 DIAGNOSIS — Z992 Dependence on renal dialysis: Secondary | ICD-10-CM | POA: Diagnosis not present

## 2018-02-25 DIAGNOSIS — Z992 Dependence on renal dialysis: Secondary | ICD-10-CM | POA: Diagnosis not present

## 2018-02-25 DIAGNOSIS — N186 End stage renal disease: Secondary | ICD-10-CM | POA: Diagnosis not present

## 2018-02-25 DIAGNOSIS — D631 Anemia in chronic kidney disease: Secondary | ICD-10-CM | POA: Diagnosis not present

## 2018-02-25 DIAGNOSIS — N2581 Secondary hyperparathyroidism of renal origin: Secondary | ICD-10-CM | POA: Diagnosis not present

## 2018-02-28 DIAGNOSIS — N2581 Secondary hyperparathyroidism of renal origin: Secondary | ICD-10-CM | POA: Diagnosis not present

## 2018-02-28 DIAGNOSIS — Z992 Dependence on renal dialysis: Secondary | ICD-10-CM | POA: Diagnosis not present

## 2018-02-28 DIAGNOSIS — D631 Anemia in chronic kidney disease: Secondary | ICD-10-CM | POA: Diagnosis not present

## 2018-02-28 DIAGNOSIS — N186 End stage renal disease: Secondary | ICD-10-CM | POA: Diagnosis not present

## 2018-03-02 DIAGNOSIS — N186 End stage renal disease: Secondary | ICD-10-CM | POA: Diagnosis not present

## 2018-03-02 DIAGNOSIS — D631 Anemia in chronic kidney disease: Secondary | ICD-10-CM | POA: Diagnosis not present

## 2018-03-02 DIAGNOSIS — N2581 Secondary hyperparathyroidism of renal origin: Secondary | ICD-10-CM | POA: Diagnosis not present

## 2018-03-02 DIAGNOSIS — Z992 Dependence on renal dialysis: Secondary | ICD-10-CM | POA: Diagnosis not present

## 2018-03-04 DIAGNOSIS — N2581 Secondary hyperparathyroidism of renal origin: Secondary | ICD-10-CM | POA: Diagnosis not present

## 2018-03-04 DIAGNOSIS — Z992 Dependence on renal dialysis: Secondary | ICD-10-CM | POA: Diagnosis not present

## 2018-03-04 DIAGNOSIS — N186 End stage renal disease: Secondary | ICD-10-CM | POA: Diagnosis not present

## 2018-03-04 DIAGNOSIS — D631 Anemia in chronic kidney disease: Secondary | ICD-10-CM | POA: Diagnosis not present

## 2018-03-06 ENCOUNTER — Ambulatory Visit (AMBULATORY_SURGERY_CENTER): Payer: Self-pay

## 2018-03-06 VITALS — Ht 74.0 in | Wt 187.8 lb

## 2018-03-06 DIAGNOSIS — Z1211 Encounter for screening for malignant neoplasm of colon: Secondary | ICD-10-CM

## 2018-03-06 MED ORDER — PEG-KCL-NACL-NASULF-NA ASC-C 140 G PO SOLR: 1.0000 | Freq: Once | ORAL | Status: AC

## 2018-03-06 NOTE — Progress Notes (Signed)
Per pt, no allergies to soy or egg products.Pt not taking any weight loss meds or using  O2 at home.  Pt refused emmi video. 

## 2018-03-07 DIAGNOSIS — N2581 Secondary hyperparathyroidism of renal origin: Secondary | ICD-10-CM | POA: Diagnosis not present

## 2018-03-07 DIAGNOSIS — Z992 Dependence on renal dialysis: Secondary | ICD-10-CM | POA: Diagnosis not present

## 2018-03-07 DIAGNOSIS — D631 Anemia in chronic kidney disease: Secondary | ICD-10-CM | POA: Diagnosis not present

## 2018-03-07 DIAGNOSIS — N186 End stage renal disease: Secondary | ICD-10-CM | POA: Diagnosis not present

## 2018-03-09 DIAGNOSIS — N2581 Secondary hyperparathyroidism of renal origin: Secondary | ICD-10-CM | POA: Diagnosis not present

## 2018-03-09 DIAGNOSIS — Z992 Dependence on renal dialysis: Secondary | ICD-10-CM | POA: Diagnosis not present

## 2018-03-09 DIAGNOSIS — D631 Anemia in chronic kidney disease: Secondary | ICD-10-CM | POA: Diagnosis not present

## 2018-03-09 DIAGNOSIS — N186 End stage renal disease: Secondary | ICD-10-CM | POA: Diagnosis not present

## 2018-03-11 DIAGNOSIS — N2581 Secondary hyperparathyroidism of renal origin: Secondary | ICD-10-CM | POA: Diagnosis not present

## 2018-03-11 DIAGNOSIS — Z992 Dependence on renal dialysis: Secondary | ICD-10-CM | POA: Diagnosis not present

## 2018-03-11 DIAGNOSIS — D631 Anemia in chronic kidney disease: Secondary | ICD-10-CM | POA: Diagnosis not present

## 2018-03-11 DIAGNOSIS — N186 End stage renal disease: Secondary | ICD-10-CM | POA: Diagnosis not present

## 2018-03-12 DIAGNOSIS — Z992 Dependence on renal dialysis: Secondary | ICD-10-CM | POA: Diagnosis not present

## 2018-03-12 DIAGNOSIS — N186 End stage renal disease: Secondary | ICD-10-CM | POA: Diagnosis not present

## 2018-03-12 DIAGNOSIS — I129 Hypertensive chronic kidney disease with stage 1 through stage 4 chronic kidney disease, or unspecified chronic kidney disease: Secondary | ICD-10-CM | POA: Diagnosis not present

## 2018-03-13 DIAGNOSIS — I43 Cardiomyopathy in diseases classified elsewhere: Secondary | ICD-10-CM | POA: Diagnosis not present

## 2018-03-13 DIAGNOSIS — J9859 Other diseases of mediastinum, not elsewhere classified: Secondary | ICD-10-CM | POA: Diagnosis not present

## 2018-03-13 DIAGNOSIS — Z87891 Personal history of nicotine dependence: Secondary | ICD-10-CM | POA: Diagnosis not present

## 2018-03-13 DIAGNOSIS — R911 Solitary pulmonary nodule: Secondary | ICD-10-CM | POA: Diagnosis not present

## 2018-03-13 DIAGNOSIS — I119 Hypertensive heart disease without heart failure: Secondary | ICD-10-CM | POA: Diagnosis not present

## 2018-03-13 DIAGNOSIS — N186 End stage renal disease: Secondary | ICD-10-CM | POA: Diagnosis not present

## 2018-03-13 DIAGNOSIS — J45909 Unspecified asthma, uncomplicated: Secondary | ICD-10-CM | POA: Diagnosis not present

## 2018-03-13 DIAGNOSIS — J438 Other emphysema: Secondary | ICD-10-CM | POA: Diagnosis not present

## 2018-03-15 DIAGNOSIS — D509 Iron deficiency anemia, unspecified: Secondary | ICD-10-CM | POA: Diagnosis not present

## 2018-03-15 DIAGNOSIS — D631 Anemia in chronic kidney disease: Secondary | ICD-10-CM | POA: Diagnosis not present

## 2018-03-15 DIAGNOSIS — N186 End stage renal disease: Secondary | ICD-10-CM | POA: Diagnosis not present

## 2018-03-15 DIAGNOSIS — Z992 Dependence on renal dialysis: Secondary | ICD-10-CM | POA: Diagnosis not present

## 2018-03-15 DIAGNOSIS — N2581 Secondary hyperparathyroidism of renal origin: Secondary | ICD-10-CM | POA: Diagnosis not present

## 2018-03-16 DIAGNOSIS — Z992 Dependence on renal dialysis: Secondary | ICD-10-CM | POA: Diagnosis not present

## 2018-03-16 DIAGNOSIS — D631 Anemia in chronic kidney disease: Secondary | ICD-10-CM | POA: Diagnosis not present

## 2018-03-16 DIAGNOSIS — N2581 Secondary hyperparathyroidism of renal origin: Secondary | ICD-10-CM | POA: Diagnosis not present

## 2018-03-16 DIAGNOSIS — N186 End stage renal disease: Secondary | ICD-10-CM | POA: Diagnosis not present

## 2018-03-16 DIAGNOSIS — D509 Iron deficiency anemia, unspecified: Secondary | ICD-10-CM | POA: Diagnosis not present

## 2018-03-18 DIAGNOSIS — D509 Iron deficiency anemia, unspecified: Secondary | ICD-10-CM | POA: Diagnosis not present

## 2018-03-18 DIAGNOSIS — Z992 Dependence on renal dialysis: Secondary | ICD-10-CM | POA: Diagnosis not present

## 2018-03-18 DIAGNOSIS — N186 End stage renal disease: Secondary | ICD-10-CM | POA: Diagnosis not present

## 2018-03-18 DIAGNOSIS — D631 Anemia in chronic kidney disease: Secondary | ICD-10-CM | POA: Diagnosis not present

## 2018-03-18 DIAGNOSIS — N2581 Secondary hyperparathyroidism of renal origin: Secondary | ICD-10-CM | POA: Diagnosis not present

## 2018-03-20 ENCOUNTER — Ambulatory Visit (AMBULATORY_SURGERY_CENTER): Payer: Medicare Other | Admitting: Gastroenterology

## 2018-03-20 ENCOUNTER — Encounter: Payer: Self-pay | Admitting: Gastroenterology

## 2018-03-20 ENCOUNTER — Other Ambulatory Visit: Payer: Self-pay

## 2018-03-20 VITALS — BP 151/68 | HR 54 | Temp 98.9°F | Resp 18 | Ht 74.0 in | Wt 187.0 lb

## 2018-03-20 DIAGNOSIS — Z1211 Encounter for screening for malignant neoplasm of colon: Secondary | ICD-10-CM | POA: Diagnosis not present

## 2018-03-20 DIAGNOSIS — Z9889 Other specified postprocedural states: Secondary | ICD-10-CM

## 2018-03-20 HISTORY — DX: Other specified postprocedural states: Z98.890

## 2018-03-20 MED ORDER — SODIUM CHLORIDE 0.9 % IV SOLN: 500.0000 mL | Freq: Once | INTRAVENOUS | Status: DC

## 2018-03-20 NOTE — Progress Notes (Signed)
PT taken to PACU. Monitors in place. VSS. Report given to RN. 

## 2018-03-20 NOTE — Progress Notes (Signed)
Pt's states no medical or surgical changes since previsit or office visit. 

## 2018-03-20 NOTE — Op Note (Signed)
Jason [name] Patient Name: Jason [name] Procedure Date: 03/20/2018 9:44 AM MRN: [id number] Endoscopist: Mallie Mussel L. Loletha Carrow , MD Age: 58 Referring MD:  Date of Birth: 02/28/1960 Gender: Male Account #: [id number] Procedure:                Colonoscopy Indications:              Screening for colorectal malignant neoplasm, This                            is the patient's first colonoscopy Medicines:                Monitored Anesthesia Care Procedure:                Pre-Anesthesia Assessment:                           - Prior to the procedure, a History and Physical                            was performed, and patient medications and                            allergies were reviewed. The patient's tolerance of                            previous anesthesia was also reviewed. The risks                            and benefits of the procedure and the sedation                            options and risks were discussed with the patient.                            All questions were answered, and informed consent                            was obtained. Prior Anticoagulants: The patient has                            taken no previous anticoagulant or antiplatelet                            agents. ASA Grade Assessment: III - A patient with                            severe systemic disease. After reviewing the risks                            and benefits, the patient was deemed in                            satisfactory condition to undergo the procedure.  After obtaining informed consent, the colonoscope                            was passed under direct vision. Throughout the                            procedure, the patient's blood pressure, pulse, and                            oxygen saturations were monitored continuously. The                            Model CF-HQ190L [id number]) scope was introduced                            through the anus and  advanced to the the cecum,                            identified by appendiceal orifice and ileocecal                            valve. The colonoscopy was performed without                            difficulty. The patient tolerated the procedure                            well. The quality of the bowel preparation was                            excellent. The ileocecal valve, appendiceal                            orifice, and rectum were photographed. Scope In: 9:57:28 AM Scope Out: 10:13:08 AM Scope Withdrawal Time: 0 hours 9 minutes 35 seconds  Total Procedure Duration: 0 hours 15 minutes 40 seconds  Findings:                 The perianal and digital rectal examinations were                            normal.                           Internal hemorrhoids were found.                           The exam was otherwise without abnormality on                            direct and retroflexion views. Complications:            No immediate complications. Estimated Blood Loss:     Estimated blood loss: none. Impression:               - Internal hemorrhoids.                           -  The examination was otherwise normal on direct                            and retroflexion views.                           - No specimens collected. Recommendation:           - Patient has a contact number available for                            emergencies. The signs and symptoms of potential                            delayed complications were discussed with the                            patient. Return to normal activities tomorrow.                            Written discharge instructions were provided to the                            patient.                           - Resume previous diet.                           - Continue present medications.                           - Repeat colonoscopy in 10 years for screening                            purposes. Henry L. Loletha Carrow, MD 03/20/2018 10:24:12  AM This report has been signed electronically. CC Letter to:             Katrine Coho, MD ([facility])

## 2018-03-20 NOTE — Patient Instructions (Signed)
Start with a meal that is low in gas-producing foods such as: EGGS GRITS TOAST PANCAKES/WAFFLES LEAN MEAT  Handout on hemorrhoids given today. Repeat colonoscopy not for 10 years.    YOU HAD AN ENDOSCOPIC PROCEDURE TODAY AT [facility]:   Refer to the procedure report that was given to you for any specific questions about what was found during the examination.  If the procedure report does not answer your questions, please call your gastroenterologist to clarify.  If you requested that your care partner not be given the details of your procedure findings, then the procedure report has been included in a sealed envelope for you to review at your convenience later.  YOU SHOULD EXPECT: Some feelings of bloating in the abdomen. Passage of more gas than usual.  Walking can help get rid of the air that was put into your GI tract during the procedure and reduce the bloating. If you had a lower endoscopy (such as a colonoscopy or flexible sigmoidoscopy) you may notice spotting of blood in your stool or on the toilet paper. If you underwent a bowel prep for your procedure, you may not have a normal bowel movement for a few days.  Please Note:  You might notice some irritation and congestion in your nose or some drainage.  This is from the oxygen used during your procedure.  There is no need for concern and it should clear up in a day or so.  SYMPTOMS TO REPORT IMMEDIATELY:   Following lower endoscopy (colonoscopy or flexible sigmoidoscopy):  Excessive amounts of blood in the stool  Significant tenderness or worsening of abdominal pains  Swelling of the abdomen that is new, acute  Fever of 100F or higher   For urgent or emergent issues, a gastroenterologist can be reached at any hour by calling [phone].   DIET:  We do recommend a small meal at first, but then you may proceed to your regular diet.  Drink plenty of fluids but you should avoid alcoholic beverages for 24  hours.  ACTIVITY:  You should plan to take it easy for the rest of today and you should NOT DRIVE or use heavy machinery until tomorrow (because of the sedation medicines used during the test).    FOLLOW UP: Our staff will call the number listed on your records the next business day following your procedure to check on you and address any questions or concerns that you may have regarding the information given to you following your procedure. If we do not reach you, we will leave a message.  However, if you are feeling well and you are not experiencing any problems, there is no need to return our call.  We will assume that you have returned to your regular daily activities without incident.  If any biopsies were taken you will be contacted by phone or by letter within the next 1-3 weeks.  Please call us at [phone] if you have not heard about the biopsies in 3 weeks.    SIGNATURES/CONFIDENTIALITY: You and/or your care partner have signed paperwork which will be entered into your electronic medical record.  These signatures attest to the fact that that the information above on your After Visit Summary has been reviewed and is understood.  Full responsibility of the confidentiality of this discharge information lies with you and/or your care-partner.

## 2018-03-21 ENCOUNTER — Telehealth: Payer: Self-pay

## 2018-03-21 DIAGNOSIS — N2581 Secondary hyperparathyroidism of renal origin: Secondary | ICD-10-CM | POA: Diagnosis not present

## 2018-03-21 DIAGNOSIS — D631 Anemia in chronic kidney disease: Secondary | ICD-10-CM | POA: Diagnosis not present

## 2018-03-21 DIAGNOSIS — D509 Iron deficiency anemia, unspecified: Secondary | ICD-10-CM | POA: Diagnosis not present

## 2018-03-21 DIAGNOSIS — N186 End stage renal disease: Secondary | ICD-10-CM | POA: Diagnosis not present

## 2018-03-21 DIAGNOSIS — Z992 Dependence on renal dialysis: Secondary | ICD-10-CM | POA: Diagnosis not present

## 2018-03-21 NOTE — Telephone Encounter (Signed)
  Follow up Call-  Call back number 03/20/2018  Post procedure Call Back phone  # [phone]  Permission to leave phone message Yes  Some recent data might be hidden     Patient questions:  Do you have a fever, pain , or abdominal swelling? No. Pain Score  0 *  Have you tolerated food without any problems? Yes.    Have you been able to return to your normal activities? Yes.    Do you have any questions about your discharge instructions: Diet   No. Medications  No. Follow up visit  No.  Do you have questions or concerns about your Care? No.  Actions: * If pain score is 4 or above: No action needed, pain <4.  No problems noted per pt. maw

## 2018-03-22 DIAGNOSIS — J9859 Other diseases of mediastinum, not elsewhere classified: Secondary | ICD-10-CM | POA: Diagnosis not present

## 2018-03-23 ENCOUNTER — Other Ambulatory Visit: Payer: Self-pay

## 2018-03-23 ENCOUNTER — Emergency Department (HOSPITAL_COMMUNITY)
Admission: EM | Admit: 2018-03-23 | Discharge: 2018-03-23 | Disposition: A | Discharge status: Home / Self Care | Payer: Medicare Other | Attending: Emergency Medicine | Admitting: Emergency Medicine

## 2018-03-23 ENCOUNTER — Encounter (HOSPITAL_COMMUNITY): Payer: Self-pay

## 2018-03-23 DIAGNOSIS — Y829 Unspecified medical devices associated with adverse incidents: Secondary | ICD-10-CM | POA: Insufficient documentation

## 2018-03-23 DIAGNOSIS — Z87891 Personal history of nicotine dependence: Secondary | ICD-10-CM | POA: Diagnosis not present

## 2018-03-23 DIAGNOSIS — I132 Hypertensive heart and chronic kidney disease with heart failure and with stage 5 chronic kidney disease, or end stage renal disease: Secondary | ICD-10-CM | POA: Insufficient documentation

## 2018-03-23 DIAGNOSIS — Z992 Dependence on renal dialysis: Secondary | ICD-10-CM | POA: Insufficient documentation

## 2018-03-23 DIAGNOSIS — T829XXA Unspecified complication of cardiac and vascular prosthetic device, implant and graft, initial encounter: Secondary | ICD-10-CM

## 2018-03-23 DIAGNOSIS — T8249XA Other complication of vascular dialysis catheter, initial encounter: Secondary | ICD-10-CM | POA: Insufficient documentation

## 2018-03-23 DIAGNOSIS — Z79899 Other long term (current) drug therapy: Secondary | ICD-10-CM | POA: Insufficient documentation

## 2018-03-23 DIAGNOSIS — R58 Hemorrhage, not elsewhere classified: Secondary | ICD-10-CM | POA: Diagnosis not present

## 2018-03-23 DIAGNOSIS — N186 End stage renal disease: Secondary | ICD-10-CM | POA: Insufficient documentation

## 2018-03-23 DIAGNOSIS — I5032 Chronic diastolic (congestive) heart failure: Secondary | ICD-10-CM | POA: Diagnosis not present

## 2018-03-23 DIAGNOSIS — N2581 Secondary hyperparathyroidism of renal origin: Secondary | ICD-10-CM | POA: Diagnosis not present

## 2018-03-23 DIAGNOSIS — D631 Anemia in chronic kidney disease: Secondary | ICD-10-CM | POA: Diagnosis not present

## 2018-03-23 DIAGNOSIS — D509 Iron deficiency anemia, unspecified: Secondary | ICD-10-CM | POA: Diagnosis not present

## 2018-03-23 NOTE — ED Triage Notes (Signed)
Pt arrives with [facility] from dialysis center [facility] in [place]) c/o fistula bleeding after completion of dialysis tx approximately around 1300.  Pt goes to dialysis on Tue/Thur/Sat and reports this is the first time this has happened. Bleeding is stopped currently with use of clamp. Per EMS, dialysis RN attempted to remove clamp x3, but fistula continued bleeding. Pt a&o x4.  EMS vitals: BP 140/92 HR 66 RR 18 O2 sat currently 100 on RA

## 2018-03-23 NOTE — Discharge Instructions (Signed)
Please follow-up with your vascular surgeon in regards to today's visit.  Monitor your symptoms.  If you continue to have bleeding through the Ace wrap on your arm you will need to return to the emergency department immediately.  Please return the emergency department for any new or worsening symptoms in the meantime.

## 2018-03-23 NOTE — ED Notes (Signed)
Patient Alert and oriented to baseline. Stable and ambulatory to baseline. Patient verbalized understanding of the discharge instructions.  Patient belongings were taken by the patient.   

## 2018-03-23 NOTE — ED Provider Notes (Signed)
[facility] EMERGENCY DEPARTMENT Provider Note   CSN: [id number] Arrival date & time: 03/23/18  1355    History   Chief Complaint Chief Complaint  Patient presents with  . Vascular Access Problem    fistula bleeding after dialysis tx    HPI Jason [name]. is a 58 y.o. male.     HPI   58 year old male with history of ESRD on dialysis, CHF, anemia, hypertension, pulmonary hypertension, who presents the emergency department today for evaluation of a bleeding fistula.  Patient states he completed his full dialysis treatment today.  After taking out 1 of the dialysis catheters he continued to have bleeding from the right upper extremity fistula for about an hour.  The second dialysis catheter was kept in place and a clamp was placed on the area of bleeding.  He was sent to the ED for further evaluation.  He denies other complaints at this time.  States that he did not have a significant amount of bleeding.  Vital signs and route within normal limits other than mild hypertension.  Past Medical History:  Diagnosis Date  . A-V fistula (HCC)    Right arm  . Abdominal hernia   . AKI (acute kidney injury) ([place]) 01/2015  . Anasarca   . Anemia   . Asthma    as a child  . Bilateral inguinal hernia   . Bilateral pleural effusion 01/2017  . Cellulitis 01/16/2017   Bilateral lower extremity  . CHF (congestive heart failure) ([place])   . Chronic venous insufficiency   . Concentric left ventricular hypertrophy 08/17/2017   Moderated, noted on ECHO  . Diastolic dysfunction 63/87/5643   noted on ECHO  . Elevated LFTs    per patient, resolved  . End stage renal disease [facility])    Dialysis T/Th/Sa  Started 02/2017/pt is waiting for a kidney transplant  . Hypertension   . Pneumonia   . Pulmonary hypertension (HCC)    Moderate  . Venous stasis ulcer ([place])    bil legs/ healed up now per pt (03/06/18)    Patient Active Problem List   Diagnosis Date Noted  . Left inguinal  hernia s/p lap repair w mesh 11/18/2017 11/18/2017  . Scrotal hernia, right, s/p lap repair w mesh 11/18/2017 05/30/2017  . Arteriovenous fistula (Right arm) for dialysis 05/30/2017  . Umbilical hernia s/p primary repair 11/18/2017 05/30/2017  . Anasarca 02/18/2017  . ESRD (end stage renal disease) ([place])   . NSVT (nonsustained ventricular tachycardia) ([place])   . Evaluation by psychiatric service required 01/19/2017  . Elevated troponin 01/16/2017  . Acute CHF (congestive heart failure) ([place]) 01/16/2017  . Venous stasis ulcer ([place]) 01/16/2017  . Essential hypertension 02/26/2015  . Abnormal EKG 01/30/2015  . Anemia 01/29/2015    Past Surgical History:  Procedure Laterality Date  . AV FISTULA PLACEMENT Right 01/20/2017   Procedure: ARTERIOVENOUS (AV) FISTULA CREATION;  Surgeon: Angelia Mould, MD;  Location: [facility];  Service: Vascular;  Laterality: Right;  . HERNIA REPAIR    . INSERTION OF DIALYSIS CATHETER Right 01/20/2017   Procedure: INSERTION OF DIALYSIS CATHETER;  Surgeon: Angelia Mould, MD;  Location: [facility];  Service: Vascular;  Laterality: Right;  . INSERTION OF MESH N/A 11/18/2017   Procedure: INSERTION OF MESH;  Surgeon: Michael Boston, MD;  [facility];  Service: General;  Laterality: N/A;  . IR FLUORO GUIDE CV LINE RIGHT  01/18/2017  . IR US GUIDE VASC ACCESS RIGHT  01/18/2017  .  LAPAROSCOPIC INGUINAL HERNIA WITH UMBILICAL HERNIA Bilateral 11/18/2017   Procedure: LAPAROSCOPIC BILATERAL INGUINAL HERNIA REPAIR WITH UMBILICAL HERNIA REPAIR WITH MESH;  Surgeon: Michael Boston, MD;  [facility];  Service: General;  Laterality: Bilateral;  . TOE SURGERY     right fracture in big toe  . TONSILLECTOMY          Home Medications    Prior to Admission medications   Medication Sig Start Date End Date Taking? Authorizing Provider  ALOE VERA JUICE LIQD Take 30 mLs by mouth daily.   Yes [provider]  calcium acetate (PHOSLO) 667 MG capsule Take 1 capsule  (667 mg total) by mouth 3 (three) times daily with meals. Patient taking differently: Take 667 mg by mouth 3 (three) times daily with meals. Lanthanum Carbonate-Take 1000 mg tid 12/21/17  Yes [name], FNP  carvedilol (COREG) 25 MG tablet Take 1 tablet (25 mg total) by mouth 2 (two) times daily with a meal. 02/02/18  Yes [name], FNP  lidocaine-prilocaine (EMLA) cream Apply 1 application topically See admin instructions. Apply 1-2 hours before dialysis. Cover with occlusive dressing 3 days weekly (Tuesday, Thursday, & Saturdays) 06/07/17  Yes [provider]  multivitamin (RENA-VIT) TABS tablet Take 1 tablet by mouth daily. 10/20/17  Yes [provider]  NON FORMULARY Vit d IV-prior to dialysis 3 times a week ( per pt)   Yes [provider]  tetrahydrozoline 0.05 % ophthalmic solution Place 1 drop into both eyes daily.   Yes [provider]  UNABLE TO FIND Take 3 capsules by mouth 3 (three) times daily. Circu Flow-- herbal supplement     [provider]    Family History Family History  Problem Relation Age of Onset  . Heart attack Mother 44       CABG hx  . Heart disease Mother   . Hypertension Mother   . Stroke Mother   . Diabetes Father   . Healthy Brother   . Prostate cancer Maternal Grandfather   . Colon cancer Neg Hx   . Colon polyps Neg Hx   . Esophageal cancer Neg Hx   . Stomach cancer Neg Hx   . Rectal cancer Neg Hx     Social History Social History   Tobacco Use  . Smoking status: Former Smoker    Years: 17.00    Last attempt to quit: 1997    Years since quitting: 23.2  . Smokeless tobacco: Never Used  . Tobacco comment: quit smoking in 1997  Substance Use Topics  . Alcohol use: Not Currently    Comment: every several months, rare  . Drug use: Not Currently    Comment: marijuana     Allergies   Lisinopril   Review of Systems Review of Systems  Constitutional: Negative for fever.  HENT:  Negative for ear pain and sore throat.   Eyes: Negative for visual disturbance.  Respiratory: Negative for cough and shortness of breath.   Cardiovascular: Negative for chest pain.  Gastrointestinal: Negative for abdominal pain, nausea and vomiting.  Genitourinary: Negative for flank pain.  Musculoskeletal: Negative for back pain.  Skin:       Fistula bleeding  Neurological: Negative for dizziness, light-headedness and headaches.  All other systems reviewed and are negative.    Physical Exam Updated Vital Signs BP (!) 142/101   Pulse (!) 58   Temp 98.1 F (36.7 C) (Oral)   Resp 13   Ht 6\' 2"  (1.88 m)  Wt 81.6 kg   SpO2 99%   BMI 23.11 kg/m   Physical Exam Vitals signs and nursing note reviewed.  Constitutional:      General: He is not in acute distress.    Appearance: He is well-developed. He is not ill-appearing or toxic-appearing.  HENT:     Head: Normocephalic and atraumatic.  Eyes:     Conjunctiva/sclera: Conjunctivae normal.  Neck:     Musculoskeletal: Neck supple.  Cardiovascular:     Rate and Rhythm: Normal rate and regular rhythm.     Heart sounds: Normal heart sounds. No murmur.  Pulmonary:     Effort: Pulmonary effort is normal. No respiratory distress.     Breath sounds: Normal breath sounds.  Abdominal:     General: Bowel sounds are normal.     Palpations: Abdomen is soft.     Tenderness: There is no abdominal tenderness.  Musculoskeletal:     Comments: AV fistula noticed to right upper extremity.  Palpable thrill noted.  Clamp in place initially.  On removal of clamp and removal of second dialysis catheter, there is no continued bleeding.  Skin:    General: Skin is warm and dry.  Neurological:     Mental Status: He is alert.      ED Treatments / Results  Labs (all labs ordered are listed, but only abnormal results are displayed) Labs Reviewed - No data to display  EKG None  Radiology No results found.  Procedures Procedures  (including critical care time)  Medications Ordered in ED Medications - No data to display   Initial Impression / Assessment and Plan / ED Course  I have reviewed the triage vital signs and the nursing notes.  Pertinent labs & imaging results that were available during my care of the patient were reviewed by me and considered in my medical decision making (see chart for details).     Final Clinical Impressions(s) / ED Diagnoses   Final diagnoses:  Complication of vascular access for dialysis, initial encounter   Patient presenting for evaluation of bleeding from his AV fistula after completing dialysis prior to arrival.  He arrived to the ED with clamp placed over the area of concern.  Second dialysis catheter still in place.  IV team was consulted who removed the dialysis catheter.  On removal of this they also removed the clamp.  There was no continued bleeding after removal of the clamp.  Patient has been observed for several hours in the ED without recurrence of bleeding.  On my reevaluation after removal of the clamp, he is in no acute distress.  Again, no continued bleeding noted.  Gauze placed over the wound with coband over top.  Patient advised to follow-up with his vascular surgeon in regards to symptoms today.  He will be advised to return to the ER for any new or worsening symptoms.  He voiced understanding plan reasons to return.  All questions answered.  Patient stable for discharge.  Discussed case with Dr. Rogene Houston who agrees with current workup and plan.  ED Discharge Orders    None       Bishop Dublin 03/23/18 1629    Fredia Sorrow, MD 03/23/18 1736

## 2018-03-25 DIAGNOSIS — N2581 Secondary hyperparathyroidism of renal origin: Secondary | ICD-10-CM | POA: Diagnosis not present

## 2018-03-25 DIAGNOSIS — N186 End stage renal disease: Secondary | ICD-10-CM | POA: Diagnosis not present

## 2018-03-25 DIAGNOSIS — D509 Iron deficiency anemia, unspecified: Secondary | ICD-10-CM | POA: Diagnosis not present

## 2018-03-25 DIAGNOSIS — D631 Anemia in chronic kidney disease: Secondary | ICD-10-CM | POA: Diagnosis not present

## 2018-03-25 DIAGNOSIS — Z992 Dependence on renal dialysis: Secondary | ICD-10-CM | POA: Diagnosis not present

## 2018-03-28 DIAGNOSIS — D509 Iron deficiency anemia, unspecified: Secondary | ICD-10-CM | POA: Diagnosis not present

## 2018-03-28 DIAGNOSIS — N2581 Secondary hyperparathyroidism of renal origin: Secondary | ICD-10-CM | POA: Diagnosis not present

## 2018-03-28 DIAGNOSIS — N186 End stage renal disease: Secondary | ICD-10-CM | POA: Diagnosis not present

## 2018-03-28 DIAGNOSIS — D631 Anemia in chronic kidney disease: Secondary | ICD-10-CM | POA: Diagnosis not present

## 2018-03-28 DIAGNOSIS — Z992 Dependence on renal dialysis: Secondary | ICD-10-CM | POA: Diagnosis not present

## 2018-03-29 DIAGNOSIS — J984 Other disorders of lung: Secondary | ICD-10-CM | POA: Diagnosis not present

## 2018-03-29 DIAGNOSIS — I272 Pulmonary hypertension, unspecified: Secondary | ICD-10-CM | POA: Diagnosis not present

## 2018-03-29 DIAGNOSIS — J9859 Other diseases of mediastinum, not elsewhere classified: Secondary | ICD-10-CM | POA: Diagnosis not present

## 2018-03-29 DIAGNOSIS — I120 Hypertensive chronic kidney disease with stage 5 chronic kidney disease or end stage renal disease: Secondary | ICD-10-CM | POA: Diagnosis not present

## 2018-03-29 DIAGNOSIS — I132 Hypertensive heart and chronic kidney disease with heart failure and with stage 5 chronic kidney disease, or end stage renal disease: Secondary | ICD-10-CM | POA: Diagnosis not present

## 2018-03-29 DIAGNOSIS — I5030 Unspecified diastolic (congestive) heart failure: Secondary | ICD-10-CM | POA: Diagnosis not present

## 2018-03-29 DIAGNOSIS — Z79899 Other long term (current) drug therapy: Secondary | ICD-10-CM | POA: Diagnosis not present

## 2018-03-29 DIAGNOSIS — R911 Solitary pulmonary nodule: Secondary | ICD-10-CM | POA: Diagnosis not present

## 2018-03-29 DIAGNOSIS — Z992 Dependence on renal dialysis: Secondary | ICD-10-CM | POA: Diagnosis not present

## 2018-03-29 DIAGNOSIS — Z87891 Personal history of nicotine dependence: Secondary | ICD-10-CM | POA: Diagnosis not present

## 2018-03-29 DIAGNOSIS — N186 End stage renal disease: Secondary | ICD-10-CM | POA: Diagnosis not present

## 2018-03-29 DIAGNOSIS — R590 Localized enlarged lymph nodes: Secondary | ICD-10-CM | POA: Diagnosis not present

## 2018-03-30 DIAGNOSIS — D509 Iron deficiency anemia, unspecified: Secondary | ICD-10-CM | POA: Diagnosis not present

## 2018-03-30 DIAGNOSIS — N186 End stage renal disease: Secondary | ICD-10-CM | POA: Diagnosis not present

## 2018-03-30 DIAGNOSIS — N2581 Secondary hyperparathyroidism of renal origin: Secondary | ICD-10-CM | POA: Diagnosis not present

## 2018-03-30 DIAGNOSIS — Z992 Dependence on renal dialysis: Secondary | ICD-10-CM | POA: Diagnosis not present

## 2018-03-30 DIAGNOSIS — D631 Anemia in chronic kidney disease: Secondary | ICD-10-CM | POA: Diagnosis not present

## 2018-04-01 DIAGNOSIS — D631 Anemia in chronic kidney disease: Secondary | ICD-10-CM | POA: Diagnosis not present

## 2018-04-01 DIAGNOSIS — N2581 Secondary hyperparathyroidism of renal origin: Secondary | ICD-10-CM | POA: Diagnosis not present

## 2018-04-01 DIAGNOSIS — D509 Iron deficiency anemia, unspecified: Secondary | ICD-10-CM | POA: Diagnosis not present

## 2018-04-01 DIAGNOSIS — N186 End stage renal disease: Secondary | ICD-10-CM | POA: Diagnosis not present

## 2018-04-01 DIAGNOSIS — Z992 Dependence on renal dialysis: Secondary | ICD-10-CM | POA: Diagnosis not present

## 2018-04-04 DIAGNOSIS — D509 Iron deficiency anemia, unspecified: Secondary | ICD-10-CM | POA: Diagnosis not present

## 2018-04-04 DIAGNOSIS — N2581 Secondary hyperparathyroidism of renal origin: Secondary | ICD-10-CM | POA: Diagnosis not present

## 2018-04-04 DIAGNOSIS — Z992 Dependence on renal dialysis: Secondary | ICD-10-CM | POA: Diagnosis not present

## 2018-04-04 DIAGNOSIS — D631 Anemia in chronic kidney disease: Secondary | ICD-10-CM | POA: Diagnosis not present

## 2018-04-04 DIAGNOSIS — N186 End stage renal disease: Secondary | ICD-10-CM | POA: Diagnosis not present

## 2018-04-05 DIAGNOSIS — I871 Compression of vein: Secondary | ICD-10-CM | POA: Diagnosis not present

## 2018-04-05 DIAGNOSIS — Z992 Dependence on renal dialysis: Secondary | ICD-10-CM | POA: Diagnosis not present

## 2018-04-05 DIAGNOSIS — N186 End stage renal disease: Secondary | ICD-10-CM | POA: Diagnosis not present

## 2018-04-06 DIAGNOSIS — D509 Iron deficiency anemia, unspecified: Secondary | ICD-10-CM | POA: Diagnosis not present

## 2018-04-06 DIAGNOSIS — N2581 Secondary hyperparathyroidism of renal origin: Secondary | ICD-10-CM | POA: Diagnosis not present

## 2018-04-06 DIAGNOSIS — D631 Anemia in chronic kidney disease: Secondary | ICD-10-CM | POA: Diagnosis not present

## 2018-04-06 DIAGNOSIS — N186 End stage renal disease: Secondary | ICD-10-CM | POA: Diagnosis not present

## 2018-04-06 DIAGNOSIS — Z992 Dependence on renal dialysis: Secondary | ICD-10-CM | POA: Diagnosis not present

## 2018-04-08 DIAGNOSIS — D631 Anemia in chronic kidney disease: Secondary | ICD-10-CM | POA: Diagnosis not present

## 2018-04-08 DIAGNOSIS — N186 End stage renal disease: Secondary | ICD-10-CM | POA: Diagnosis not present

## 2018-04-08 DIAGNOSIS — D509 Iron deficiency anemia, unspecified: Secondary | ICD-10-CM | POA: Diagnosis not present

## 2018-04-08 DIAGNOSIS — Z992 Dependence on renal dialysis: Secondary | ICD-10-CM | POA: Diagnosis not present

## 2018-04-08 DIAGNOSIS — N2581 Secondary hyperparathyroidism of renal origin: Secondary | ICD-10-CM | POA: Diagnosis not present

## 2018-04-11 DIAGNOSIS — D509 Iron deficiency anemia, unspecified: Secondary | ICD-10-CM | POA: Diagnosis not present

## 2018-04-11 DIAGNOSIS — N2581 Secondary hyperparathyroidism of renal origin: Secondary | ICD-10-CM | POA: Diagnosis not present

## 2018-04-11 DIAGNOSIS — Z992 Dependence on renal dialysis: Secondary | ICD-10-CM | POA: Diagnosis not present

## 2018-04-11 DIAGNOSIS — D631 Anemia in chronic kidney disease: Secondary | ICD-10-CM | POA: Diagnosis not present

## 2018-04-11 DIAGNOSIS — N186 End stage renal disease: Secondary | ICD-10-CM | POA: Diagnosis not present

## 2018-04-12 DIAGNOSIS — I129 Hypertensive chronic kidney disease with stage 1 through stage 4 chronic kidney disease, or unspecified chronic kidney disease: Secondary | ICD-10-CM | POA: Diagnosis not present

## 2018-04-12 DIAGNOSIS — Z992 Dependence on renal dialysis: Secondary | ICD-10-CM | POA: Diagnosis not present

## 2018-04-12 DIAGNOSIS — N186 End stage renal disease: Secondary | ICD-10-CM | POA: Diagnosis not present

## 2018-04-13 DIAGNOSIS — N186 End stage renal disease: Secondary | ICD-10-CM | POA: Diagnosis not present

## 2018-04-13 DIAGNOSIS — N2581 Secondary hyperparathyroidism of renal origin: Secondary | ICD-10-CM | POA: Diagnosis not present

## 2018-04-13 DIAGNOSIS — D631 Anemia in chronic kidney disease: Secondary | ICD-10-CM | POA: Diagnosis not present

## 2018-04-13 DIAGNOSIS — Z992 Dependence on renal dialysis: Secondary | ICD-10-CM | POA: Diagnosis not present

## 2018-04-15 DIAGNOSIS — D631 Anemia in chronic kidney disease: Secondary | ICD-10-CM | POA: Diagnosis not present

## 2018-04-15 DIAGNOSIS — Z992 Dependence on renal dialysis: Secondary | ICD-10-CM | POA: Diagnosis not present

## 2018-04-15 DIAGNOSIS — N186 End stage renal disease: Secondary | ICD-10-CM | POA: Diagnosis not present

## 2018-04-15 DIAGNOSIS — N2581 Secondary hyperparathyroidism of renal origin: Secondary | ICD-10-CM | POA: Diagnosis not present

## 2018-04-18 DIAGNOSIS — D631 Anemia in chronic kidney disease: Secondary | ICD-10-CM | POA: Diagnosis not present

## 2018-04-18 DIAGNOSIS — N2581 Secondary hyperparathyroidism of renal origin: Secondary | ICD-10-CM | POA: Diagnosis not present

## 2018-04-18 DIAGNOSIS — N186 End stage renal disease: Secondary | ICD-10-CM | POA: Diagnosis not present

## 2018-04-18 DIAGNOSIS — Z992 Dependence on renal dialysis: Secondary | ICD-10-CM | POA: Diagnosis not present

## 2018-04-20 DIAGNOSIS — N2581 Secondary hyperparathyroidism of renal origin: Secondary | ICD-10-CM | POA: Diagnosis not present

## 2018-04-20 DIAGNOSIS — D631 Anemia in chronic kidney disease: Secondary | ICD-10-CM | POA: Diagnosis not present

## 2018-04-20 DIAGNOSIS — Z992 Dependence on renal dialysis: Secondary | ICD-10-CM | POA: Diagnosis not present

## 2018-04-20 DIAGNOSIS — N186 End stage renal disease: Secondary | ICD-10-CM | POA: Diagnosis not present

## 2018-04-22 DIAGNOSIS — N186 End stage renal disease: Secondary | ICD-10-CM | POA: Diagnosis not present

## 2018-04-22 DIAGNOSIS — N2581 Secondary hyperparathyroidism of renal origin: Secondary | ICD-10-CM | POA: Diagnosis not present

## 2018-04-22 DIAGNOSIS — D631 Anemia in chronic kidney disease: Secondary | ICD-10-CM | POA: Diagnosis not present

## 2018-04-22 DIAGNOSIS — Z992 Dependence on renal dialysis: Secondary | ICD-10-CM | POA: Diagnosis not present

## 2018-04-25 DIAGNOSIS — D631 Anemia in chronic kidney disease: Secondary | ICD-10-CM | POA: Diagnosis not present

## 2018-04-25 DIAGNOSIS — N2581 Secondary hyperparathyroidism of renal origin: Secondary | ICD-10-CM | POA: Diagnosis not present

## 2018-04-25 DIAGNOSIS — Z992 Dependence on renal dialysis: Secondary | ICD-10-CM | POA: Diagnosis not present

## 2018-04-25 DIAGNOSIS — N186 End stage renal disease: Secondary | ICD-10-CM | POA: Diagnosis not present

## 2018-04-27 DIAGNOSIS — N2581 Secondary hyperparathyroidism of renal origin: Secondary | ICD-10-CM | POA: Diagnosis not present

## 2018-04-27 DIAGNOSIS — N186 End stage renal disease: Secondary | ICD-10-CM | POA: Diagnosis not present

## 2018-04-27 DIAGNOSIS — D631 Anemia in chronic kidney disease: Secondary | ICD-10-CM | POA: Diagnosis not present

## 2018-04-27 DIAGNOSIS — Z992 Dependence on renal dialysis: Secondary | ICD-10-CM | POA: Diagnosis not present

## 2018-04-29 DIAGNOSIS — N186 End stage renal disease: Secondary | ICD-10-CM | POA: Diagnosis not present

## 2018-04-29 DIAGNOSIS — D631 Anemia in chronic kidney disease: Secondary | ICD-10-CM | POA: Diagnosis not present

## 2018-04-29 DIAGNOSIS — N2581 Secondary hyperparathyroidism of renal origin: Secondary | ICD-10-CM | POA: Diagnosis not present

## 2018-04-29 DIAGNOSIS — Z992 Dependence on renal dialysis: Secondary | ICD-10-CM | POA: Diagnosis not present

## 2018-05-02 DIAGNOSIS — N186 End stage renal disease: Secondary | ICD-10-CM | POA: Diagnosis not present

## 2018-05-02 DIAGNOSIS — Z992 Dependence on renal dialysis: Secondary | ICD-10-CM | POA: Diagnosis not present

## 2018-05-02 DIAGNOSIS — D631 Anemia in chronic kidney disease: Secondary | ICD-10-CM | POA: Diagnosis not present

## 2018-05-02 DIAGNOSIS — N2581 Secondary hyperparathyroidism of renal origin: Secondary | ICD-10-CM | POA: Diagnosis not present

## 2018-05-06 DIAGNOSIS — N186 End stage renal disease: Secondary | ICD-10-CM | POA: Diagnosis not present

## 2018-05-06 DIAGNOSIS — Z992 Dependence on renal dialysis: Secondary | ICD-10-CM | POA: Diagnosis not present

## 2018-05-06 DIAGNOSIS — N2581 Secondary hyperparathyroidism of renal origin: Secondary | ICD-10-CM | POA: Diagnosis not present

## 2018-05-06 DIAGNOSIS — D631 Anemia in chronic kidney disease: Secondary | ICD-10-CM | POA: Diagnosis not present

## 2018-05-09 DIAGNOSIS — N186 End stage renal disease: Secondary | ICD-10-CM | POA: Diagnosis not present

## 2018-05-09 DIAGNOSIS — N2581 Secondary hyperparathyroidism of renal origin: Secondary | ICD-10-CM | POA: Diagnosis not present

## 2018-05-09 DIAGNOSIS — Z992 Dependence on renal dialysis: Secondary | ICD-10-CM | POA: Diagnosis not present

## 2018-05-09 DIAGNOSIS — D631 Anemia in chronic kidney disease: Secondary | ICD-10-CM | POA: Diagnosis not present

## 2018-05-11 DIAGNOSIS — N186 End stage renal disease: Secondary | ICD-10-CM | POA: Diagnosis not present

## 2018-05-11 DIAGNOSIS — Z992 Dependence on renal dialysis: Secondary | ICD-10-CM | POA: Diagnosis not present

## 2018-05-11 DIAGNOSIS — D631 Anemia in chronic kidney disease: Secondary | ICD-10-CM | POA: Diagnosis not present

## 2018-05-11 DIAGNOSIS — N2581 Secondary hyperparathyroidism of renal origin: Secondary | ICD-10-CM | POA: Diagnosis not present

## 2018-05-12 DIAGNOSIS — Z992 Dependence on renal dialysis: Secondary | ICD-10-CM | POA: Diagnosis not present

## 2018-05-12 DIAGNOSIS — N186 End stage renal disease: Secondary | ICD-10-CM | POA: Diagnosis not present

## 2018-05-12 DIAGNOSIS — I129 Hypertensive chronic kidney disease with stage 1 through stage 4 chronic kidney disease, or unspecified chronic kidney disease: Secondary | ICD-10-CM | POA: Diagnosis not present

## 2018-05-16 DIAGNOSIS — Z992 Dependence on renal dialysis: Secondary | ICD-10-CM | POA: Diagnosis not present

## 2018-05-16 DIAGNOSIS — N2581 Secondary hyperparathyroidism of renal origin: Secondary | ICD-10-CM | POA: Diagnosis not present

## 2018-05-16 DIAGNOSIS — N186 End stage renal disease: Secondary | ICD-10-CM | POA: Diagnosis not present

## 2018-05-16 DIAGNOSIS — D631 Anemia in chronic kidney disease: Secondary | ICD-10-CM | POA: Diagnosis not present

## 2018-05-16 DIAGNOSIS — D509 Iron deficiency anemia, unspecified: Secondary | ICD-10-CM | POA: Diagnosis not present

## 2018-05-17 ENCOUNTER — Telehealth: Payer: Self-pay | Admitting: *Deleted

## 2018-05-17 NOTE — Telephone Encounter (Signed)
05/17/18 LMOM @ 10:10 am, [id number] up appointment.

## 2018-05-18 DIAGNOSIS — D509 Iron deficiency anemia, unspecified: Secondary | ICD-10-CM | POA: Diagnosis not present

## 2018-05-18 DIAGNOSIS — Z992 Dependence on renal dialysis: Secondary | ICD-10-CM | POA: Diagnosis not present

## 2018-05-18 DIAGNOSIS — N186 End stage renal disease: Secondary | ICD-10-CM | POA: Diagnosis not present

## 2018-05-18 DIAGNOSIS — N2581 Secondary hyperparathyroidism of renal origin: Secondary | ICD-10-CM | POA: Diagnosis not present

## 2018-05-18 DIAGNOSIS — D631 Anemia in chronic kidney disease: Secondary | ICD-10-CM | POA: Diagnosis not present

## 2018-05-23 ENCOUNTER — Ambulatory Visit: Payer: Self-pay | Admitting: Family Medicine

## 2018-05-26 ENCOUNTER — Ambulatory Visit (INDEPENDENT_AMBULATORY_CARE_PROVIDER_SITE_OTHER): Payer: Medicare Other | Admitting: Family Medicine

## 2018-05-26 ENCOUNTER — Other Ambulatory Visit: Payer: Self-pay

## 2018-05-26 ENCOUNTER — Encounter: Payer: Self-pay | Admitting: Family Medicine

## 2018-05-26 VITALS — BP 162/90 | HR 60 | Temp 98.1°F | Ht 74.0 in | Wt 197.0 lb

## 2018-05-26 DIAGNOSIS — E538 Deficiency of other specified B group vitamins: Secondary | ICD-10-CM

## 2018-05-26 DIAGNOSIS — I10 Essential (primary) hypertension: Secondary | ICD-10-CM

## 2018-05-26 DIAGNOSIS — Z992 Dependence on renal dialysis: Secondary | ICD-10-CM | POA: Diagnosis not present

## 2018-05-26 DIAGNOSIS — R8290 Unspecified abnormal findings in urine: Secondary | ICD-10-CM

## 2018-05-26 DIAGNOSIS — N186 End stage renal disease: Secondary | ICD-10-CM | POA: Diagnosis not present

## 2018-05-26 DIAGNOSIS — Z09 Encounter for follow-up examination after completed treatment for conditions other than malignant neoplasm: Secondary | ICD-10-CM

## 2018-05-26 DIAGNOSIS — E559 Vitamin D deficiency, unspecified: Secondary | ICD-10-CM | POA: Diagnosis not present

## 2018-05-26 LAB — POCT URINALYSIS DIP (MANUAL ENTRY)
Bilirubin, UA: NEGATIVE
Glucose, UA: NEGATIVE mg/dL
Ketones, POC UA: NEGATIVE mg/dL
Nitrite, UA: NEGATIVE
Protein Ur, POC: 30 mg/dL — AB
Spec Grav, UA: 1.01 (ref 1.010–1.025)
Urobilinogen, UA: 0.2 E.U./dL
pH, UA: 5.5 (ref 5.0–8.0)

## 2018-05-26 NOTE — Progress Notes (Signed)
[facility]   Established Patient Office Visit  Subjective:  Patient ID: Jason Losasso., male    DOB: July 03, 1960  Age: 58 y.o. MRN: [id number]  CC:  Chief Complaint  Patient presents with  . Follow-up    chronic condition     HPI Jason [name] is a 58 year old male presents for Follow Up today.   Past Medical History:  Diagnosis Date  . A-V fistula (HCC)    Right arm  . Abdominal hernia   . AKI (acute kidney injury) ([place]) 01/2015  . Anasarca   . Anemia   . Asthma    as a child  . Bilateral inguinal hernia   . Bilateral pleural effusion 01/2017  . Cellulitis 01/16/2017   Bilateral lower extremity  . CHF (congestive heart failure) ([place])   . Chronic venous insufficiency   . Concentric left ventricular hypertrophy 08/17/2017   Moderated, noted on ECHO  . Diastolic dysfunction 24/58/0998   noted on ECHO  . Elevated LFTs    per patient, resolved  . End stage renal disease [facility])    Dialysis T/Th/Sa  Started 02/2017/pt is waiting for a kidney transplant  . Hypertension   . Pneumonia   . Pulmonary hypertension (HCC)    Moderate  . Venous stasis ulcer ([place])    bil legs/ healed up now per pt (03/06/18)   Current Status: Since his last office visit, he is doing well with no complaints. He denies visual changes, chest pain, cough, shortness of breath, heart palpitations, and falls. He has  occasional headaches and dizziness with position changes. Denies severe headaches, confusion, seizures, double vision, and blurred vision, nausea and vomiting. He continues his Dialysis treatments on days Tues, Thurs, and Saturdays. He has been tolerating his treatments well. Since his last office visit, he He followed up with Dr. Johney Maine for Hernia surgery on 11/18/2017. He completed his Colonoscopy on 03/20/2018.  He denies fevers, chills, fatigue, recent infections, weight loss, and night sweats. He has not had any visual changes, and  falls. No chest pain, heart palpitations, cough and shortness of breath reported. No reports of GI problems such as diarrhea, and constipation. He has no reports of blood in stools, dysuria and hematuria. No depression or anxiety today. He denies pain today.    Past Surgical History:  Procedure Laterality Date  . AV FISTULA PLACEMENT Right 01/20/2017   Procedure: ARTERIOVENOUS (AV) FISTULA CREATION;  Surgeon: Angelia Mould, MD;  Location: [place];  Service: Vascular;  Laterality: Right;  . HERNIA REPAIR    . INSERTION OF DIALYSIS CATHETER Right 01/20/2017   Procedure: INSERTION OF DIALYSIS CATHETER;  Surgeon: Angelia Mould, MD;  Location: [facility];  Service: Vascular;  Laterality: Right;  . INSERTION OF MESH N/A 11/18/2017   Procedure: INSERTION OF MESH;  Surgeon: Michael Boston, MD;  [facility];  Service: General;  Laterality: N/A;  . IR FLUORO GUIDE CV LINE RIGHT  01/18/2017  . IR US GUIDE VASC ACCESS RIGHT  01/18/2017  . LAPAROSCOPIC INGUINAL HERNIA WITH UMBILICAL HERNIA Bilateral 11/18/2017   Procedure: LAPAROSCOPIC BILATERAL INGUINAL HERNIA REPAIR WITH UMBILICAL HERNIA REPAIR WITH MESH;  Surgeon: Michael Boston, MD;  [facility];  Service: General;  Laterality: Bilateral;  . TOE SURGERY     right fracture in big toe  . TONSILLECTOMY      Family History  Problem Relation Age of Onset  . Heart attack  Mother 36       CABG hx  . Heart disease Mother   . Hypertension Mother   . Stroke Mother   . Diabetes Father   . Healthy Brother   . Prostate cancer Maternal Grandfather   . Colon cancer Neg Hx   . Colon polyps Neg Hx   . Esophageal cancer Neg Hx   . Stomach cancer Neg Hx   . Rectal cancer Neg Hx     Social History   Socioeconomic History  . Marital status: Single    Spouse name: Not on file  . Number of children: Not on file  . Years of education: Not on file  . Highest education level: Not on file  Occupational History  . Not on file  Social Needs  .  Financial resource strain: Not on file  . Food insecurity:    Worry: Not on file    Inability: Not on file  . Transportation needs:    Medical: Not on file    Non-medical: Not on file  Tobacco Use  . Smoking status: Former Smoker    Years: 17.00    Last attempt to quit: 1997    Years since quitting: 23.3  . Smokeless tobacco: Never Used  . Tobacco comment: quit smoking in 1997  Substance and Sexual Activity  . Alcohol use: Not Currently    Comment: every several months, rare  . Drug use: Not Currently    Comment: marijuana  . Sexual activity: Not on file  Lifestyle  . Physical activity:    Days per week: Not on file    Minutes per session: Not on file  . Stress: Not on file  Relationships  . Social connections:    Talks on phone: Not on file    Gets together: Not on file    Attends religious service: Not on file    Active member of club or organization: Not on file    Attends meetings of clubs or organizations: Not on file    Relationship status: Not on file  . Intimate partner violence:    Fear of current or ex partner: Not on file    Emotionally abused: Not on file    Physically abused: Not on file    Forced sexual activity: Not on file  Other Topics Concern  . Not on file  Social History Narrative  . Not on file    Outpatient Medications Prior to Visit  Medication Sig Dispense Refill  . carvedilol (COREG) 25 MG tablet Take 1 tablet (25 mg total) by mouth 2 (two) times daily with a meal. 60 tablet 2  . lidocaine-prilocaine (EMLA) cream Apply 1 application topically See admin instructions. Apply 1-2 hours before dialysis. Cover with occlusive dressing 3 days weekly (Tuesday, Thursday, & Saturdays)  5  . multivitamin (RENA-VIT) TABS tablet Take 1 tablet by mouth daily.  11  . NON FORMULARY Vit d IV-prior to dialysis 3 times a week ( per pt)    . calcium acetate (PHOSLO) 667 MG capsule Take 1 capsule (667 mg total) by mouth 3 (three) times daily with meals. (Patient  taking differently: Take 667 mg by mouth 3 (three) times daily with meals. Lanthanum Carbonate-Take 1000 mg tid) 90 capsule 1  . ALOE VERA JUICE LIQD Take 30 mLs by mouth daily.    Marland Kitchen tetrahydrozoline 0.05 % ophthalmic solution Place 1 drop into both eyes daily.    Marland Kitchen UNABLE TO FIND Take 3 capsules by mouth 3 (  three) times daily. Circu Flow-- herbal supplement      No facility-administered medications prior to visit.     Allergies  Allergen Reactions  . Lisinopril Cough    ROS Review of Systems  Constitutional: Negative.   HENT: Negative.   Eyes: Negative.   Respiratory: Negative.   Cardiovascular: Negative.   Gastrointestinal: Negative.   Endocrine: Negative.   Genitourinary:       Hemodialysis treatments Tuesday, Thursday, and Saturdays.   Musculoskeletal: Negative.   Skin: Negative.   Allergic/Immunologic: Negative.   Neurological: Negative.   Hematological: Negative.   Psychiatric/Behavioral: Negative.       Objective:    Physical Exam  Constitutional: He is oriented to person, place, and time. He appears well-developed and well-nourished.  HENT:  Head: Normocephalic and atraumatic.  Eyes: Conjunctivae are normal.  Neck: Normal range of motion. Neck supple.  Cardiovascular: Normal rate, regular rhythm, normal heart sounds and intact distal pulses.  Pulmonary/Chest: Effort normal and breath sounds normal.  Abdominal: Soft. Bowel sounds are normal.  Musculoskeletal: Normal range of motion.  Neurological: He is alert and oriented to person, place, and time. He has normal reflexes.  Skin: Skin is warm and dry.  Psychiatric: He has a normal mood and affect. His behavior is normal. Judgment and thought content normal.  Nursing note and vitals reviewed.   BP (!) 162/90   Pulse 60   Temp 98.1 F (36.7 C) (Oral)   Ht 6\' 2"  (1.88 m)   Wt 197 lb (89.4 kg)   SpO2 100%   BMI 25.29 kg/m  Wt Readings from Last 3 Encounters:  05/26/18 197 lb (89.4 kg)  03/23/18 180 lb  (81.6 kg)  03/20/18 187 lb (84.8 kg)     Health Maintenance Due  Topic Date Due  . Hepatitis C Screening  09[birth year]  . TETANUS/TDAP  06/28/1980    There are no preventive care reminders to display for this patient.  Lab Results  Component Value Date   TSH 1.177 01/31/2015   Lab Results  Component Value Date   WBC 8.3 11/18/2017   HGB 13.4 11/18/2017   HCT 40.4 11/18/2017   MCV 88.8 11/18/2017   PLT 250 11/18/2017   Lab Results  Component Value Date   NA 133 (L) 11/18/2017   K 5.8 (H) 11/18/2017   CO2 28 11/18/2017   GLUCOSE 99 11/18/2017   BUN 62 (H) 11/18/2017   CREATININE 8.62 (H) 11/18/2017   BILITOT 0.5 08/03/2017   ALKPHOS 46 08/03/2017   AST 13 08/03/2017   ALT 10 08/03/2017   PROT 7.2 08/03/2017   ALBUMIN 4.1 08/03/2017   CALCIUM 9.3 11/18/2017   ANIONGAP 14 11/18/2017   Lab Results  Component Value Date   CHOL 138 08/03/2017   Lab Results  Component Value Date   HDL 50 08/03/2017   Lab Results  Component Value Date   LDLCALC 77 08/03/2017   Lab Results  Component Value Date   TRIG 57 08/03/2017   Lab Results  Component Value Date   CHOLHDL 2.8 08/03/2017   Lab Results  Component Value Date   HGBA1C 4.9 01/17/2017      Assessment & Plan:   1. Accelerated hypertension Blood pressures are elevated today. patient states that he has not taken his blood pressure medications as of yet today. Clonidine 0.2 mg given to patient in office and blood pressure readings decreasing, butremain elevated. He denies severe headaches, confusion, seizures, double vision, and blurred vision, nausea and  vomiting. He will report to ED if he experiences these symptoms. Patient verbalized understanding.    2. Essential hypertension - POCT urinalysis dipstick - CBC with Differential - Comprehensive metabolic panel - Lipid Panel - TSH  3. ESRD (end stage renal disease) ([place]) He will continue Dialysis treatments as prescribed.   4. Hemodialysis patient  [facility]) Continue treatments on Tuesdays, Thursdays, and Saturdays.   5. Vitamin D deficiency - Vitamin D, 25-hydroxy  6. Vitamin B12 deficiency - Vitamin B12  7. Abnormal urinalysis Results are pending. - Urine Culture  8. Follow up Follow up .in 1 week for blood pressure check only. Follow up in 1 month for office visit.   No orders of the defined types were placed in this encounter.  Orders Placed This Encounter  Procedures  . Urine Culture  . CBC with Differential  . Comprehensive metabolic panel  . Lipid Panel  . TSH  . Vitamin D, 25-hydroxy  . Vitamin B12  . POCT urinalysis dipstick   Referral Orders  No referral(s) requested today    No orders of the defined types were placed in this encounter.   Orders Placed This Encounter  Procedures  . Urine Culture  . CBC with Differential  . Comprehensive metabolic panel  . Lipid Panel  . TSH  . Vitamin D, 25-hydroxy  . Vitamin B12  . POCT urinalysis dipstick    Referral Orders  No referral(s) requested today    Jason Becton,  MSN, FNP-C [facility] [address] [place], [place] [address] [phone]  No orders of the defined types were placed in this encounter.   Orders Placed This Encounter  Procedures  . Urine Culture  . CBC with Differential  . Comprehensive metabolic panel  . Lipid Panel  . TSH  . Vitamin D, 25-hydroxy  . Vitamin B12  . POCT urinalysis dipstick    Referral Orders  No referral(s) requested today    Problem List Items Addressed This Visit      Cardiovascular and Mediastinum   Essential hypertension   Relevant Orders   POCT urinalysis dipstick (Completed)   CBC with Differential   Comprehensive metabolic panel   Lipid Panel   TSH     Genitourinary   ESRD (end stage renal disease) ([place])    Other Visit Diagnoses    Accelerated hypertension    -  Primary   Hemodialysis patient ([place])       Vitamin D deficiency        Relevant Orders   Vitamin D, 25-hydroxy   Vitamin B12 deficiency       Relevant Orders   Vitamin B12   Abnormal urinalysis       Relevant Orders   Urine Culture   Follow up          No orders of the defined types were placed in this encounter.   Follow-up: No follow-ups on file.    Jason Glatter, FNP

## 2018-05-27 DIAGNOSIS — D631 Anemia in chronic kidney disease: Secondary | ICD-10-CM | POA: Diagnosis not present

## 2018-05-27 DIAGNOSIS — Z992 Dependence on renal dialysis: Secondary | ICD-10-CM | POA: Diagnosis not present

## 2018-05-27 DIAGNOSIS — N186 End stage renal disease: Secondary | ICD-10-CM | POA: Diagnosis not present

## 2018-05-27 DIAGNOSIS — N2581 Secondary hyperparathyroidism of renal origin: Secondary | ICD-10-CM | POA: Diagnosis not present

## 2018-05-27 DIAGNOSIS — D509 Iron deficiency anemia, unspecified: Secondary | ICD-10-CM | POA: Diagnosis not present

## 2018-05-27 LAB — CBC WITH DIFFERENTIAL/PLATELET
Basophils Absolute: 0 10*3/uL (ref 0.0–0.2)
Basos: 1 %
EOS (ABSOLUTE): 0.6 10*3/uL — ABNORMAL HIGH (ref 0.0–0.4)
Eos: 14 %
Hematocrit: 29.8 % — ABNORMAL LOW (ref 37.5–51.0)
Hemoglobin: 10.3 g/dL — ABNORMAL LOW (ref 13.0–17.7)
Immature Grans (Abs): 0 10*3/uL (ref 0.0–0.1)
Immature Granulocytes: 0 %
Lymphocytes Absolute: 1 10*3/uL (ref 0.7–3.1)
Lymphs: 24 %
MCH: 30.4 pg (ref 26.6–33.0)
MCHC: 34.6 g/dL (ref 31.5–35.7)
MCV: 88 fL (ref 79–97)
Monocytes Absolute: 0.4 10*3/uL (ref 0.1–0.9)
Monocytes: 10 %
Neutrophils Absolute: 2.1 10*3/uL (ref 1.4–7.0)
Neutrophils: 51 %
Platelets: 247 10*3/uL (ref 150–450)
RBC: 3.39 x10E6/uL — ABNORMAL LOW (ref 4.14–5.80)
RDW: 12.5 % (ref 11.6–15.4)
WBC: 4.2 10*3/uL (ref 3.4–10.8)

## 2018-05-27 LAB — LIPID PANEL
Chol/HDL Ratio: 3.6 ratio (ref 0.0–5.0)
Cholesterol, Total: 134 mg/dL (ref 100–199)
HDL: 37 mg/dL — ABNORMAL LOW (ref >39)
LDL Calculated: 78 mg/dL (ref 0–99)
Triglycerides: 94 mg/dL (ref 0–149)
VLDL Cholesterol Cal: 19 mg/dL (ref 5–40)

## 2018-05-27 LAB — COMPREHENSIVE METABOLIC PANEL
ALT: 7 IU/L (ref 0–44)
AST: 8 IU/L (ref 0–40)
Albumin/Globulin Ratio: 1.5 (ref 1.2–2.2)
Albumin: 4.1 g/dL (ref 3.8–4.9)
Alkaline Phosphatase: 39 IU/L (ref 39–117)
BUN/Creatinine Ratio: 7 — ABNORMAL LOW (ref 9–20)
BUN: 93 mg/dL (ref 6–24)
Bilirubin Total: 0.4 mg/dL (ref 0.0–1.2)
CO2: 19 mmol/L — ABNORMAL LOW (ref 20–29)
Calcium: 8.6 mg/dL — ABNORMAL LOW (ref 8.7–10.2)
Chloride: 95 mmol/L — ABNORMAL LOW (ref 96–106)
Creatinine, Ser: 13.01 mg/dL — ABNORMAL HIGH (ref 0.76–1.27)
GFR calc Af Amer: 4 mL/min/{1.73_m2} — ABNORMAL LOW (ref >59)
GFR calc non Af Amer: 4 mL/min/{1.73_m2} — ABNORMAL LOW (ref >59)
Globulin, Total: 2.7 g/dL (ref 1.5–4.5)
Glucose: 90 mg/dL (ref 65–99)
Potassium: 4.7 mmol/L (ref 3.5–5.2)
Sodium: 135 mmol/L (ref 134–144)
Total Protein: 6.8 g/dL (ref 6.0–8.5)

## 2018-05-27 LAB — TSH: TSH: 1.6 u[IU]/mL (ref 0.450–4.500)

## 2018-05-27 LAB — VITAMIN D 25 HYDROXY (VIT D DEFICIENCY, FRACTURES): Vit D, 25-Hydroxy: 23.3 ng/mL — ABNORMAL LOW (ref 30.0–100.0)

## 2018-05-27 LAB — VITAMIN B12: Vitamin B-12: 403 pg/mL (ref 232–1245)

## 2018-05-28 ENCOUNTER — Encounter: Payer: Self-pay | Admitting: Family Medicine

## 2018-05-28 DIAGNOSIS — Z992 Dependence on renal dialysis: Secondary | ICD-10-CM | POA: Insufficient documentation

## 2018-05-28 DIAGNOSIS — E559 Vitamin D deficiency, unspecified: Secondary | ICD-10-CM | POA: Insufficient documentation

## 2018-05-28 LAB — URINE CULTURE: Organism ID, Bacteria: NO GROWTH

## 2018-05-30 DIAGNOSIS — Z992 Dependence on renal dialysis: Secondary | ICD-10-CM | POA: Diagnosis not present

## 2018-05-30 DIAGNOSIS — D631 Anemia in chronic kidney disease: Secondary | ICD-10-CM | POA: Diagnosis not present

## 2018-05-30 DIAGNOSIS — N2581 Secondary hyperparathyroidism of renal origin: Secondary | ICD-10-CM | POA: Diagnosis not present

## 2018-05-30 DIAGNOSIS — N186 End stage renal disease: Secondary | ICD-10-CM | POA: Diagnosis not present

## 2018-05-30 DIAGNOSIS — D509 Iron deficiency anemia, unspecified: Secondary | ICD-10-CM | POA: Diagnosis not present

## 2018-06-06 DIAGNOSIS — N2581 Secondary hyperparathyroidism of renal origin: Secondary | ICD-10-CM | POA: Diagnosis not present

## 2018-06-06 DIAGNOSIS — D631 Anemia in chronic kidney disease: Secondary | ICD-10-CM | POA: Diagnosis not present

## 2018-06-06 DIAGNOSIS — N186 End stage renal disease: Secondary | ICD-10-CM | POA: Diagnosis not present

## 2018-06-06 DIAGNOSIS — D509 Iron deficiency anemia, unspecified: Secondary | ICD-10-CM | POA: Diagnosis not present

## 2018-06-06 DIAGNOSIS — Z992 Dependence on renal dialysis: Secondary | ICD-10-CM | POA: Diagnosis not present

## 2018-06-08 DIAGNOSIS — N2581 Secondary hyperparathyroidism of renal origin: Secondary | ICD-10-CM | POA: Diagnosis not present

## 2018-06-08 DIAGNOSIS — Z992 Dependence on renal dialysis: Secondary | ICD-10-CM | POA: Diagnosis not present

## 2018-06-08 DIAGNOSIS — N186 End stage renal disease: Secondary | ICD-10-CM | POA: Diagnosis not present

## 2018-06-08 DIAGNOSIS — D631 Anemia in chronic kidney disease: Secondary | ICD-10-CM | POA: Diagnosis not present

## 2018-06-08 DIAGNOSIS — D509 Iron deficiency anemia, unspecified: Secondary | ICD-10-CM | POA: Diagnosis not present

## 2018-06-10 DIAGNOSIS — N2581 Secondary hyperparathyroidism of renal origin: Secondary | ICD-10-CM | POA: Diagnosis not present

## 2018-06-10 DIAGNOSIS — D631 Anemia in chronic kidney disease: Secondary | ICD-10-CM | POA: Diagnosis not present

## 2018-06-10 DIAGNOSIS — D509 Iron deficiency anemia, unspecified: Secondary | ICD-10-CM | POA: Diagnosis not present

## 2018-06-10 DIAGNOSIS — N186 End stage renal disease: Secondary | ICD-10-CM | POA: Diagnosis not present

## 2018-06-10 DIAGNOSIS — Z992 Dependence on renal dialysis: Secondary | ICD-10-CM | POA: Diagnosis not present

## 2018-06-12 DIAGNOSIS — I129 Hypertensive chronic kidney disease with stage 1 through stage 4 chronic kidney disease, or unspecified chronic kidney disease: Secondary | ICD-10-CM | POA: Diagnosis not present

## 2018-06-12 DIAGNOSIS — N186 End stage renal disease: Secondary | ICD-10-CM | POA: Diagnosis not present

## 2018-06-12 DIAGNOSIS — Z992 Dependence on renal dialysis: Secondary | ICD-10-CM | POA: Diagnosis not present

## 2018-06-13 DIAGNOSIS — D631 Anemia in chronic kidney disease: Secondary | ICD-10-CM | POA: Diagnosis not present

## 2018-06-13 DIAGNOSIS — N186 End stage renal disease: Secondary | ICD-10-CM | POA: Diagnosis not present

## 2018-06-13 DIAGNOSIS — Z992 Dependence on renal dialysis: Secondary | ICD-10-CM | POA: Diagnosis not present

## 2018-06-13 DIAGNOSIS — D509 Iron deficiency anemia, unspecified: Secondary | ICD-10-CM | POA: Diagnosis not present

## 2018-06-13 DIAGNOSIS — N2581 Secondary hyperparathyroidism of renal origin: Secondary | ICD-10-CM | POA: Diagnosis not present

## 2018-06-15 DIAGNOSIS — Z992 Dependence on renal dialysis: Secondary | ICD-10-CM | POA: Diagnosis not present

## 2018-06-15 DIAGNOSIS — D631 Anemia in chronic kidney disease: Secondary | ICD-10-CM | POA: Diagnosis not present

## 2018-06-15 DIAGNOSIS — D509 Iron deficiency anemia, unspecified: Secondary | ICD-10-CM | POA: Diagnosis not present

## 2018-06-15 DIAGNOSIS — N186 End stage renal disease: Secondary | ICD-10-CM | POA: Diagnosis not present

## 2018-06-15 DIAGNOSIS — N2581 Secondary hyperparathyroidism of renal origin: Secondary | ICD-10-CM | POA: Diagnosis not present

## 2018-06-20 DIAGNOSIS — N2581 Secondary hyperparathyroidism of renal origin: Secondary | ICD-10-CM | POA: Diagnosis not present

## 2018-06-20 DIAGNOSIS — D509 Iron deficiency anemia, unspecified: Secondary | ICD-10-CM | POA: Diagnosis not present

## 2018-06-20 DIAGNOSIS — N186 End stage renal disease: Secondary | ICD-10-CM | POA: Diagnosis not present

## 2018-06-20 DIAGNOSIS — Z992 Dependence on renal dialysis: Secondary | ICD-10-CM | POA: Diagnosis not present

## 2018-06-20 DIAGNOSIS — D631 Anemia in chronic kidney disease: Secondary | ICD-10-CM | POA: Diagnosis not present

## 2018-06-22 DIAGNOSIS — N2581 Secondary hyperparathyroidism of renal origin: Secondary | ICD-10-CM | POA: Diagnosis not present

## 2018-06-22 DIAGNOSIS — D631 Anemia in chronic kidney disease: Secondary | ICD-10-CM | POA: Diagnosis not present

## 2018-06-22 DIAGNOSIS — D509 Iron deficiency anemia, unspecified: Secondary | ICD-10-CM | POA: Diagnosis not present

## 2018-06-22 DIAGNOSIS — Z992 Dependence on renal dialysis: Secondary | ICD-10-CM | POA: Diagnosis not present

## 2018-06-22 DIAGNOSIS — N186 End stage renal disease: Secondary | ICD-10-CM | POA: Diagnosis not present

## 2018-06-23 ENCOUNTER — Telehealth: Payer: Self-pay

## 2018-06-23 NOTE — Telephone Encounter (Signed)
Patient is negative for the screening and will be coming into office.

## 2018-06-26 ENCOUNTER — Ambulatory Visit: Payer: Self-pay | Admitting: Family Medicine

## 2018-06-27 DIAGNOSIS — Z992 Dependence on renal dialysis: Secondary | ICD-10-CM | POA: Diagnosis not present

## 2018-06-27 DIAGNOSIS — D509 Iron deficiency anemia, unspecified: Secondary | ICD-10-CM | POA: Diagnosis not present

## 2018-06-27 DIAGNOSIS — N186 End stage renal disease: Secondary | ICD-10-CM | POA: Diagnosis not present

## 2018-06-27 DIAGNOSIS — N2581 Secondary hyperparathyroidism of renal origin: Secondary | ICD-10-CM | POA: Diagnosis not present

## 2018-06-27 DIAGNOSIS — D631 Anemia in chronic kidney disease: Secondary | ICD-10-CM | POA: Diagnosis not present

## 2018-06-29 DIAGNOSIS — Z992 Dependence on renal dialysis: Secondary | ICD-10-CM | POA: Diagnosis not present

## 2018-06-29 DIAGNOSIS — N186 End stage renal disease: Secondary | ICD-10-CM | POA: Diagnosis not present

## 2018-06-29 DIAGNOSIS — D509 Iron deficiency anemia, unspecified: Secondary | ICD-10-CM | POA: Diagnosis not present

## 2018-06-29 DIAGNOSIS — D631 Anemia in chronic kidney disease: Secondary | ICD-10-CM | POA: Diagnosis not present

## 2018-06-29 DIAGNOSIS — N2581 Secondary hyperparathyroidism of renal origin: Secondary | ICD-10-CM | POA: Diagnosis not present

## 2018-07-01 DIAGNOSIS — D631 Anemia in chronic kidney disease: Secondary | ICD-10-CM | POA: Diagnosis not present

## 2018-07-01 DIAGNOSIS — Z992 Dependence on renal dialysis: Secondary | ICD-10-CM | POA: Diagnosis not present

## 2018-07-01 DIAGNOSIS — D509 Iron deficiency anemia, unspecified: Secondary | ICD-10-CM | POA: Diagnosis not present

## 2018-07-01 DIAGNOSIS — N2581 Secondary hyperparathyroidism of renal origin: Secondary | ICD-10-CM | POA: Diagnosis not present

## 2018-07-01 DIAGNOSIS — N186 End stage renal disease: Secondary | ICD-10-CM | POA: Diagnosis not present

## 2018-07-04 DIAGNOSIS — Z992 Dependence on renal dialysis: Secondary | ICD-10-CM | POA: Diagnosis not present

## 2018-07-04 DIAGNOSIS — D631 Anemia in chronic kidney disease: Secondary | ICD-10-CM | POA: Diagnosis not present

## 2018-07-04 DIAGNOSIS — N186 End stage renal disease: Secondary | ICD-10-CM | POA: Diagnosis not present

## 2018-07-04 DIAGNOSIS — N2581 Secondary hyperparathyroidism of renal origin: Secondary | ICD-10-CM | POA: Diagnosis not present

## 2018-07-04 DIAGNOSIS — D509 Iron deficiency anemia, unspecified: Secondary | ICD-10-CM | POA: Diagnosis not present

## 2018-07-08 DIAGNOSIS — D509 Iron deficiency anemia, unspecified: Secondary | ICD-10-CM | POA: Diagnosis not present

## 2018-07-08 DIAGNOSIS — N186 End stage renal disease: Secondary | ICD-10-CM | POA: Diagnosis not present

## 2018-07-08 DIAGNOSIS — D631 Anemia in chronic kidney disease: Secondary | ICD-10-CM | POA: Diagnosis not present

## 2018-07-08 DIAGNOSIS — N2581 Secondary hyperparathyroidism of renal origin: Secondary | ICD-10-CM | POA: Diagnosis not present

## 2018-07-08 DIAGNOSIS — Z992 Dependence on renal dialysis: Secondary | ICD-10-CM | POA: Diagnosis not present

## 2018-07-11 DIAGNOSIS — N2581 Secondary hyperparathyroidism of renal origin: Secondary | ICD-10-CM | POA: Diagnosis not present

## 2018-07-11 DIAGNOSIS — N186 End stage renal disease: Secondary | ICD-10-CM | POA: Diagnosis not present

## 2018-07-11 DIAGNOSIS — D509 Iron deficiency anemia, unspecified: Secondary | ICD-10-CM | POA: Diagnosis not present

## 2018-07-11 DIAGNOSIS — Z992 Dependence on renal dialysis: Secondary | ICD-10-CM | POA: Diagnosis not present

## 2018-07-11 DIAGNOSIS — D631 Anemia in chronic kidney disease: Secondary | ICD-10-CM | POA: Diagnosis not present

## 2018-07-12 DIAGNOSIS — I129 Hypertensive chronic kidney disease with stage 1 through stage 4 chronic kidney disease, or unspecified chronic kidney disease: Secondary | ICD-10-CM | POA: Diagnosis not present

## 2018-07-12 DIAGNOSIS — Z992 Dependence on renal dialysis: Secondary | ICD-10-CM | POA: Diagnosis not present

## 2018-07-12 DIAGNOSIS — N186 End stage renal disease: Secondary | ICD-10-CM | POA: Diagnosis not present

## 2018-07-13 DIAGNOSIS — D509 Iron deficiency anemia, unspecified: Secondary | ICD-10-CM | POA: Diagnosis not present

## 2018-07-13 DIAGNOSIS — N186 End stage renal disease: Secondary | ICD-10-CM | POA: Diagnosis not present

## 2018-07-13 DIAGNOSIS — Z992 Dependence on renal dialysis: Secondary | ICD-10-CM | POA: Diagnosis not present

## 2018-07-13 DIAGNOSIS — D631 Anemia in chronic kidney disease: Secondary | ICD-10-CM | POA: Diagnosis not present

## 2018-07-13 DIAGNOSIS — N2581 Secondary hyperparathyroidism of renal origin: Secondary | ICD-10-CM | POA: Diagnosis not present

## 2018-07-15 DIAGNOSIS — N186 End stage renal disease: Secondary | ICD-10-CM | POA: Diagnosis not present

## 2018-07-15 DIAGNOSIS — N2581 Secondary hyperparathyroidism of renal origin: Secondary | ICD-10-CM | POA: Diagnosis not present

## 2018-07-15 DIAGNOSIS — D509 Iron deficiency anemia, unspecified: Secondary | ICD-10-CM | POA: Diagnosis not present

## 2018-07-15 DIAGNOSIS — D631 Anemia in chronic kidney disease: Secondary | ICD-10-CM | POA: Diagnosis not present

## 2018-07-15 DIAGNOSIS — Z992 Dependence on renal dialysis: Secondary | ICD-10-CM | POA: Diagnosis not present

## 2018-07-18 DIAGNOSIS — D631 Anemia in chronic kidney disease: Secondary | ICD-10-CM | POA: Diagnosis not present

## 2018-07-18 DIAGNOSIS — Z992 Dependence on renal dialysis: Secondary | ICD-10-CM | POA: Diagnosis not present

## 2018-07-18 DIAGNOSIS — N186 End stage renal disease: Secondary | ICD-10-CM | POA: Diagnosis not present

## 2018-07-18 DIAGNOSIS — D509 Iron deficiency anemia, unspecified: Secondary | ICD-10-CM | POA: Diagnosis not present

## 2018-07-18 DIAGNOSIS — N2581 Secondary hyperparathyroidism of renal origin: Secondary | ICD-10-CM | POA: Diagnosis not present

## 2018-07-20 DIAGNOSIS — N2581 Secondary hyperparathyroidism of renal origin: Secondary | ICD-10-CM | POA: Diagnosis not present

## 2018-07-20 DIAGNOSIS — D509 Iron deficiency anemia, unspecified: Secondary | ICD-10-CM | POA: Diagnosis not present

## 2018-07-20 DIAGNOSIS — D631 Anemia in chronic kidney disease: Secondary | ICD-10-CM | POA: Diagnosis not present

## 2018-07-20 DIAGNOSIS — N186 End stage renal disease: Secondary | ICD-10-CM | POA: Diagnosis not present

## 2018-07-20 DIAGNOSIS — Z992 Dependence on renal dialysis: Secondary | ICD-10-CM | POA: Diagnosis not present

## 2018-07-22 DIAGNOSIS — N2581 Secondary hyperparathyroidism of renal origin: Secondary | ICD-10-CM | POA: Diagnosis not present

## 2018-07-22 DIAGNOSIS — D631 Anemia in chronic kidney disease: Secondary | ICD-10-CM | POA: Diagnosis not present

## 2018-07-22 DIAGNOSIS — N186 End stage renal disease: Secondary | ICD-10-CM | POA: Diagnosis not present

## 2018-07-22 DIAGNOSIS — Z992 Dependence on renal dialysis: Secondary | ICD-10-CM | POA: Diagnosis not present

## 2018-07-22 DIAGNOSIS — D509 Iron deficiency anemia, unspecified: Secondary | ICD-10-CM | POA: Diagnosis not present

## 2018-07-27 DIAGNOSIS — D631 Anemia in chronic kidney disease: Secondary | ICD-10-CM | POA: Diagnosis not present

## 2018-07-27 DIAGNOSIS — D509 Iron deficiency anemia, unspecified: Secondary | ICD-10-CM | POA: Diagnosis not present

## 2018-07-27 DIAGNOSIS — N186 End stage renal disease: Secondary | ICD-10-CM | POA: Diagnosis not present

## 2018-07-27 DIAGNOSIS — Z992 Dependence on renal dialysis: Secondary | ICD-10-CM | POA: Diagnosis not present

## 2018-07-27 DIAGNOSIS — N2581 Secondary hyperparathyroidism of renal origin: Secondary | ICD-10-CM | POA: Diagnosis not present

## 2018-08-01 DIAGNOSIS — N2581 Secondary hyperparathyroidism of renal origin: Secondary | ICD-10-CM | POA: Diagnosis not present

## 2018-08-01 DIAGNOSIS — D631 Anemia in chronic kidney disease: Secondary | ICD-10-CM | POA: Diagnosis not present

## 2018-08-01 DIAGNOSIS — Z992 Dependence on renal dialysis: Secondary | ICD-10-CM | POA: Diagnosis not present

## 2018-08-01 DIAGNOSIS — D509 Iron deficiency anemia, unspecified: Secondary | ICD-10-CM | POA: Diagnosis not present

## 2018-08-01 DIAGNOSIS — N186 End stage renal disease: Secondary | ICD-10-CM | POA: Diagnosis not present

## 2018-08-02 ENCOUNTER — Telehealth: Payer: Self-pay | Admitting: *Deleted

## 2018-08-02 NOTE — Telephone Encounter (Signed)
Left message for pt to call, he has an appointment 08-07-2018 and it needs to change to virtual or rescheduled.

## 2018-08-04 ENCOUNTER — Encounter: Payer: Self-pay | Admitting: *Deleted

## 2018-08-04 NOTE — Telephone Encounter (Signed)
I called to confirm pt appt on 08-07-18 with Dr [name].

## 2018-08-07 ENCOUNTER — Other Ambulatory Visit: Payer: Self-pay

## 2018-08-07 ENCOUNTER — Telehealth (INDEPENDENT_AMBULATORY_CARE_PROVIDER_SITE_OTHER): Payer: Medicare Other | Admitting: Cardiology

## 2018-08-07 DIAGNOSIS — I5032 Chronic diastolic (congestive) heart failure: Secondary | ICD-10-CM | POA: Diagnosis not present

## 2018-08-07 DIAGNOSIS — I428 Other cardiomyopathies: Secondary | ICD-10-CM

## 2018-08-07 DIAGNOSIS — I10 Essential (primary) hypertension: Secondary | ICD-10-CM

## 2018-08-07 NOTE — Patient Instructions (Signed)

## 2018-08-07 NOTE — Progress Notes (Signed)
Virtual Visit via Video Note   This visit type was conducted due to national recommendations for restrictions regarding the COVID-19 Pandemic (e.g. social distancing) in an effort to limit this patient's exposure and mitigate transmission in our community.  Due to his co-morbid illnesses, this patient is at least at moderate risk for complications without adequate follow up.  This format is felt to be most appropriate for this patient at this time.  All issues noted in this document were discussed and addressed.  A limited physical exam was performed with this format.  Please refer to the patient's chart for his consent to telehealth for [facility].   Date:  08/07/2018   ID:  Jason [name]., DOB November 03, 1960, MRN [id number]  Patient Location:Home Provider Location: Home  PCP:  [name], FNP  Cardiologist:  Dr [name]  Evaluation Performed:  Follow-Up Visit  Chief Complaint:  FU CM and CHF  History of Present Illness:    Follow-up cardiomyopathy and congestive heart failure.  Patient seen January 2019 with elevated troponin.  Echocardiogram showed ejection fraction 35 to 40%, moderate diastolic dysfunction, mild mitral regurgitation, severe biatrial enlargement, mild to moderate tricuspid regurgitation and moderate pulmonary hypertension.  Cardiomyopathy felt likely secondary to uncontrolled hypertension.  He was also noted to have nonsustained ventricular tachycardia.  Cardiac catheterization was recommended but patient declined.  Treated with medications.  Follow-up echocardiogram August 2019 showed ejection fraction 50 to 55%, moderate right ventricular enlargement.  Patient is being evaluated for renal transplant at [place] echocardiogram at [facility] January 2020 showed no ST changes, normal left ventricular function at rest and no inducible ischemia.  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  Since last seen, the patient denies any dyspnea on exertion, orthopnea, PND, pedal edema, palpitations, syncope or chest pain.   The patient does not have symptoms concerning for COVID-19 infection (fever, chills, cough, or new shortness of breath).    Past Medical History:  Diagnosis Date   A-V fistula ([place])    Right arm   Abdominal hernia    AKI (acute kidney injury) ([place]) 01/2015   Anasarca    Anemia    Asthma    as a child   Bilateral inguinal hernia    Bilateral pleural effusion 01/2017   Cellulitis 01/16/2017   Bilateral lower extremity   CHF (congestive heart failure) (HCC)    Chronic venous insufficiency    Concentric left ventricular hypertrophy 08/17/2017   Moderated, noted on ECHO   Diastolic dysfunction 49/70/2637   noted on ECHO   Elevated LFTs    per patient, resolved   End stage renal disease ([place])    Dialysis T/Th/Sa  Started 02/2017/pt is waiting for a kidney transplant   H/O right inguinal hernia repair 11/2017   History of colonoscopy 03/20/2018   Hypertension    Pneumonia    Pulmonary hypertension ([place])    Moderate   Venous stasis ulcer ([place])    bil legs/ healed up now per pt (03/06/18)   Past Surgical History:  Procedure Laterality Date   AV FISTULA PLACEMENT Right 01/20/2017   Procedure: ARTERIOVENOUS (AV) FISTULA CREATION;  Surgeon: Angelia Mould, MD;  [facility];  Service: Vascular;  Laterality: Right;   HERNIA REPAIR     INSERTION OF DIALYSIS CATHETER Right 01/20/2017   Procedure: INSERTION OF DIALYSIS CATHETER;  Surgeon: Angelia Mould, MD;  Location: [place];  Service: Vascular;  Laterality: Right;   INSERTION OF MESH N/A 11/18/2017   Procedure: INSERTION OF MESH;  Surgeon: Michael Boston, MD;  Location: [facility] ORS;  Service: General;  Laterality: N/A;   IR FLUORO GUIDE CV LINE RIGHT  01/18/2017   IR US GUIDE [place] RIGHT  01/18/2017    LAPAROSCOPIC INGUINAL HERNIA WITH UMBILICAL HERNIA Bilateral 11/18/2017   Procedure: LAPAROSCOPIC BILATERAL INGUINAL HERNIA REPAIR WITH UMBILICAL HERNIA REPAIR WITH MESH;  Surgeon: Michael Boston, MD;  [facility];  Service: General;  Laterality: Bilateral;   TOE SURGERY     right fracture in big toe   TONSILLECTOMY       Current Meds  Medication Sig   amLODipine (NORVASC) 10 MG tablet Take 10 mg by mouth every evening.   carvedilol (COREG) 25 MG tablet Take 1 tablet (25 mg total) by mouth 2 (two) times daily with a meal.   lidocaine-prilocaine (EMLA) cream Apply 1 application topically See admin instructions. Apply 1-2 hours before dialysis. Cover with occlusive dressing 3 days weekly (Tuesday, Thursday, & Saturdays)   multivitamin (RENA-VIT) TABS tablet Take 1 tablet by mouth daily.   NON FORMULARY Vit d IV-prior to dialysis 3 times a week ( per pt)   OVER THE COUNTER MEDICATION Renadyl - Take 2 capsules by mouth daily.     Allergies:   Lisinopril   Social History   Tobacco Use   Smoking status: Former Smoker    Years: 17.00    Quit date: 1997    Years since quitting: 23.5   Smokeless tobacco: Never Used   Tobacco comment: quit smoking in 1997  Substance Use Topics   Alcohol use: Not Currently    Comment: every several months, rare   Drug use: Not Currently    Comment: marijuana     Family Hx: The patient's family history includes Diabetes in his father; Healthy in his brother; Heart attack (age of onset: 56) in his mother; Heart disease in his mother; Hypertension in his mother; Prostate cancer in his maternal grandfather; Stroke in his mother. There is no history of Colon cancer, Colon polyps, Esophageal cancer, Stomach cancer, or Rectal cancer.  ROS:   Please see the history of present illness.    No Fever, chills  or productive cough All other systems reviewed and are negative.  Recent Labs: 05/26/2018: ALT 7; BUN 93; Creatinine, Ser 13.01; Hemoglobin  10.3; Platelets 247; Potassium 4.7; Sodium 135; TSH 1.600   Recent Lipid Panel Lab Results  Component Value Date/Time   CHOL 134 05/26/2018 08:49 AM   TRIG 94 05/26/2018 08:49 AM   HDL 37 (L) 05/26/2018 08:49 AM   CHOLHDL 3.6 05/26/2018 08:49 AM   CHOLHDL 3.9 01/31/2015 03:00 AM   LDLCALC 78 05/26/2018 08:49 AM    Wt Readings from Last 3 Encounters:  05/26/18 197 lb (89.4 kg)  03/23/18 180 lb (81.6 kg)  03/20/18 187 lb (84.8 kg)     Objective:    Vital Signs:  There were no vitals taken for this visit.   VITAL SIGNS:  reviewed NAD Answers questions appropriately Normal affect Remainder of physical examination not performed (telehealth visit; coronavirus pandemic)  ASSESSMENT & PLAN:    1. Chronic diastolic congestive heart failure-volume status is managed by dialysis. 2. Hypertension-plan to continue amlodipine and carvedilol. 3. cardiomyopathy-this was felt secondary to uncontrolled hypertension and his LV function has now normalized.  Note there was no ischemia  on his stress echocardiogram.  Continue present medications for blood pressure control. 4. Mediastinal mass-followed at [place] [facility]: The importance of social distancing was discussed today.  Time:   Today, I have spent 16 minutes with the patient with telehealth technology discussing the above problems.     Medication Adjustments/Labs and Tests Ordered: Current medicines are reviewed at length with the patient today.  Concerns regarding medicines are outlined above.   Tests Ordered: No orders of the defined types were placed in this encounter.   Medication Changes: No orders of the defined types were placed in this encounter.   Follow Up:  Virtual Visit or In Person in 6 month(s)  Signed, Kirk Ruths, MD  08/07/2018 8:17 AM    [place]

## 2018-08-08 DIAGNOSIS — N2581 Secondary hyperparathyroidism of renal origin: Secondary | ICD-10-CM | POA: Diagnosis not present

## 2018-08-08 DIAGNOSIS — Z992 Dependence on renal dialysis: Secondary | ICD-10-CM | POA: Diagnosis not present

## 2018-08-08 DIAGNOSIS — D631 Anemia in chronic kidney disease: Secondary | ICD-10-CM | POA: Diagnosis not present

## 2018-08-08 DIAGNOSIS — D509 Iron deficiency anemia, unspecified: Secondary | ICD-10-CM | POA: Diagnosis not present

## 2018-08-08 DIAGNOSIS — N186 End stage renal disease: Secondary | ICD-10-CM | POA: Diagnosis not present

## 2018-08-10 DIAGNOSIS — D509 Iron deficiency anemia, unspecified: Secondary | ICD-10-CM | POA: Diagnosis not present

## 2018-08-10 DIAGNOSIS — N2581 Secondary hyperparathyroidism of renal origin: Secondary | ICD-10-CM | POA: Diagnosis not present

## 2018-08-10 DIAGNOSIS — N186 End stage renal disease: Secondary | ICD-10-CM | POA: Diagnosis not present

## 2018-08-10 DIAGNOSIS — D631 Anemia in chronic kidney disease: Secondary | ICD-10-CM | POA: Diagnosis not present

## 2018-08-10 DIAGNOSIS — Z992 Dependence on renal dialysis: Secondary | ICD-10-CM | POA: Diagnosis not present

## 2018-08-12 DIAGNOSIS — I129 Hypertensive chronic kidney disease with stage 1 through stage 4 chronic kidney disease, or unspecified chronic kidney disease: Secondary | ICD-10-CM | POA: Diagnosis not present

## 2018-08-12 DIAGNOSIS — D509 Iron deficiency anemia, unspecified: Secondary | ICD-10-CM | POA: Diagnosis not present

## 2018-08-12 DIAGNOSIS — N2581 Secondary hyperparathyroidism of renal origin: Secondary | ICD-10-CM | POA: Diagnosis not present

## 2018-08-12 DIAGNOSIS — Z992 Dependence on renal dialysis: Secondary | ICD-10-CM | POA: Diagnosis not present

## 2018-08-12 DIAGNOSIS — N186 End stage renal disease: Secondary | ICD-10-CM | POA: Diagnosis not present

## 2018-08-12 DIAGNOSIS — D631 Anemia in chronic kidney disease: Secondary | ICD-10-CM | POA: Diagnosis not present

## 2018-08-15 DIAGNOSIS — Z992 Dependence on renal dialysis: Secondary | ICD-10-CM | POA: Diagnosis not present

## 2018-08-15 DIAGNOSIS — D631 Anemia in chronic kidney disease: Secondary | ICD-10-CM | POA: Diagnosis not present

## 2018-08-15 DIAGNOSIS — D509 Iron deficiency anemia, unspecified: Secondary | ICD-10-CM | POA: Diagnosis not present

## 2018-08-15 DIAGNOSIS — N2581 Secondary hyperparathyroidism of renal origin: Secondary | ICD-10-CM | POA: Diagnosis not present

## 2018-08-15 DIAGNOSIS — N186 End stage renal disease: Secondary | ICD-10-CM | POA: Diagnosis not present

## 2018-08-19 DIAGNOSIS — Z992 Dependence on renal dialysis: Secondary | ICD-10-CM | POA: Diagnosis not present

## 2018-08-19 DIAGNOSIS — D509 Iron deficiency anemia, unspecified: Secondary | ICD-10-CM | POA: Diagnosis not present

## 2018-08-19 DIAGNOSIS — D631 Anemia in chronic kidney disease: Secondary | ICD-10-CM | POA: Diagnosis not present

## 2018-08-19 DIAGNOSIS — N2581 Secondary hyperparathyroidism of renal origin: Secondary | ICD-10-CM | POA: Diagnosis not present

## 2018-08-19 DIAGNOSIS — N186 End stage renal disease: Secondary | ICD-10-CM | POA: Diagnosis not present

## 2018-08-22 DIAGNOSIS — Z992 Dependence on renal dialysis: Secondary | ICD-10-CM | POA: Diagnosis not present

## 2018-08-22 DIAGNOSIS — N2581 Secondary hyperparathyroidism of renal origin: Secondary | ICD-10-CM | POA: Diagnosis not present

## 2018-08-22 DIAGNOSIS — N186 End stage renal disease: Secondary | ICD-10-CM | POA: Diagnosis not present

## 2018-08-22 DIAGNOSIS — D631 Anemia in chronic kidney disease: Secondary | ICD-10-CM | POA: Diagnosis not present

## 2018-08-22 DIAGNOSIS — D509 Iron deficiency anemia, unspecified: Secondary | ICD-10-CM | POA: Diagnosis not present

## 2018-08-23 ENCOUNTER — Other Ambulatory Visit: Payer: Self-pay

## 2018-08-23 ENCOUNTER — Ambulatory Visit (INDEPENDENT_AMBULATORY_CARE_PROVIDER_SITE_OTHER): Payer: Medicare Other | Admitting: Family Medicine

## 2018-08-23 ENCOUNTER — Encounter: Payer: Self-pay | Admitting: Family Medicine

## 2018-08-23 VITALS — BP 128/78 | HR 60 | Temp 97.8°F | Ht 74.0 in | Wt 189.6 lb

## 2018-08-23 DIAGNOSIS — Z87898 Personal history of other specified conditions: Secondary | ICD-10-CM

## 2018-08-23 DIAGNOSIS — Z09 Encounter for follow-up examination after completed treatment for conditions other than malignant neoplasm: Secondary | ICD-10-CM

## 2018-08-23 DIAGNOSIS — H6123 Impacted cerumen, bilateral: Secondary | ICD-10-CM

## 2018-08-23 DIAGNOSIS — Z992 Dependence on renal dialysis: Secondary | ICD-10-CM | POA: Diagnosis not present

## 2018-08-23 DIAGNOSIS — I10 Essential (primary) hypertension: Secondary | ICD-10-CM | POA: Diagnosis not present

## 2018-08-23 LAB — POCT URINALYSIS DIP (MANUAL ENTRY)
Bilirubin, UA: NEGATIVE
Glucose, UA: NEGATIVE mg/dL
Ketones, POC UA: NEGATIVE mg/dL
Leukocytes, UA: NEGATIVE
Nitrite, UA: NEGATIVE
Protein Ur, POC: >=300 mg/dL — AB
Spec Grav, UA: 1.02 (ref 1.010–1.025)
Urobilinogen, UA: 0.2 E.U./dL
pH, UA: 7 (ref 5.0–8.0)

## 2018-08-23 NOTE — Progress Notes (Signed)
Patient [facility]   Established Patient Office Visit  Subjective:  Patient ID: Jason Jay., male    DOB: 1960-08-28  Age: 58 y.o. MRN: [id number]  CC:  Chief Complaint  Patient presents with  . Follow-up    Chronic condition     HPI Jason Reser. is a a 58 year old male who presents for follow up today.   Past Medical History:  Diagnosis Date  . A-V fistula (HCC)    Right arm  . Abdominal hernia   . AKI (acute kidney injury) ([place]) 01/2015  . Anasarca   . Anemia   . Asthma    as a child  . Bilateral inguinal hernia   . Bilateral pleural effusion 01/2017  . Cellulitis 01/16/2017   Bilateral lower extremity  . CHF (congestive heart failure) ([place])   . Chronic venous insufficiency   . Concentric left ventricular hypertrophy 08/17/2017   Moderated, noted on ECHO  . Diastolic dysfunction 26/37/8588   noted on ECHO  . Elevated LFTs    per patient, resolved  . End stage renal disease [facility])    Dialysis T/Th/Sa  Started 02/2017/pt is waiting for a kidney transplant  . H/O right inguinal hernia repair 11/2017  . History of colonoscopy 03/20/2018  . Hypertension   . Pneumonia   . Pulmonary hypertension (HCC)    Moderate  . Venous stasis ulcer ([place])    bil legs/ healed up now per pt (03/06/18)   Current Status: Since his last office visit, he is doing well with no complaints. He denies visual changes, chest pain, cough, shortness of breath, heart palpitations, and falls. He has occasional headaches and dizziness with position changes. Denies severe headaches, confusion, seizures, double vision, and blurred vision, nausea and vomiting. He continues hemodialysis treatments regularly on Tues, Thurs, and Fridays. He has c/o increased earwax in both ears. He has had lavage, but it has not been effective in removing all earwax.   He denies fevers, chills, fatigue, recent infections, weight loss, and night sweats. He has not had  any visual changes, and falls. No chest pain, heart palpitations, cough and shortness of breath reported. No reports of GI problems such as diarrhea, and constipation. He has no reports of blood in stools, dysuria and hematuria. No depression or anxiety reported. He denies pain today.   Past Surgical History:  Procedure Laterality Date  . AV FISTULA PLACEMENT Right 01/20/2017   Procedure: ARTERIOVENOUS (AV) FISTULA CREATION;  Surgeon: Angelia Mould, MD;  Location: [place];  Service: Vascular;  Laterality: Right;  . HERNIA REPAIR    . INSERTION OF DIALYSIS CATHETER Right 01/20/2017   Procedure: INSERTION OF DIALYSIS CATHETER;  Surgeon: Angelia Mould, MD;  Location: [place];  Service: Vascular;  Laterality: Right;  . INSERTION OF MESH N/A 11/18/2017   Procedure: INSERTION OF MESH;  Surgeon: Michael Boston, MD;  [facility];  Service: General;  Laterality: N/A;  . IR FLUORO GUIDE CV LINE RIGHT  01/18/2017  . IR US GUIDE VASC ACCESS RIGHT  01/18/2017  . LAPAROSCOPIC INGUINAL HERNIA WITH UMBILICAL HERNIA Bilateral 11/18/2017   Procedure: LAPAROSCOPIC BILATERAL INGUINAL HERNIA REPAIR WITH UMBILICAL HERNIA REPAIR WITH MESH;  Surgeon: Michael Boston, MD;  [facility];  Service: General;  Laterality: Bilateral;  . TOE SURGERY     right fracture in big toe  . TONSILLECTOMY      Family History  Problem Relation Age  of Onset  . Heart attack Mother 65       CABG hx  . Heart disease Mother   . Hypertension Mother   . Stroke Mother   . Diabetes Father   . Healthy Brother   . Prostate cancer Maternal Grandfather   . Colon cancer Neg Hx   . Colon polyps Neg Hx   . Esophageal cancer Neg Hx   . Stomach cancer Neg Hx   . Rectal cancer Neg Hx     Social History   Socioeconomic History  . Marital status: Single    Spouse name: Not on file  . Number of children: Not on file  . Years of education: Not on file  . Highest education level: Not on file  Occupational History  . Not on  file  Social Needs  . Financial resource strain: Not on file  . Food insecurity    Worry: Not on file    Inability: Not on file  . Transportation needs    Medical: Not on file    Non-medical: Not on file  Tobacco Use  . Smoking status: Former Smoker    Years: 17.00    Quit date: 1997    Years since quitting: 23.6  . Smokeless tobacco: Never Used  . Tobacco comment: quit smoking in 1997  Substance and Sexual Activity  . Alcohol use: Not Currently    Comment: every several months, rare  . Drug use: Not Currently    Comment: marijuana  . Sexual activity: Not on file  Lifestyle  . Physical activity    Days per week: Not on file    Minutes per session: Not on file  . Stress: Not on file  Relationships  . Social Herbalist on phone: Not on file    Gets together: Not on file    Attends religious service: Not on file    Active member of club or organization: Not on file    Attends meetings of clubs or organizations: Not on file    Relationship status: Not on file  . Intimate partner violence    Fear of current or ex partner: Not on file    Emotionally abused: Not on file    Physically abused: Not on file    Forced sexual activity: Not on file  Other Topics Concern  . Not on file  Social History Narrative  . Not on file    Outpatient Medications Prior to Visit  Medication Sig Dispense Refill  . amLODipine (NORVASC) 10 MG tablet Take 10 mg by mouth every evening.    . carvedilol (COREG) 25 MG tablet Take 1 tablet (25 mg total) by mouth 2 (two) times daily with a meal. 60 tablet 2  . lidocaine-prilocaine (EMLA) cream Apply 1 application topically See admin instructions. Apply 1-2 hours before dialysis. Cover with occlusive dressing 3 days weekly (Tuesday, Thursday, & Saturdays)  5  . multivitamin (RENA-VIT) TABS tablet Take 1 tablet by mouth daily.  11  . NON FORMULARY Vit d IV-prior to dialysis 3 times a week ( per pt)    . OVER THE COUNTER MEDICATION Renadyl -  Take 2 capsules by mouth daily.     No facility-administered medications prior to visit.     Allergies  Allergen Reactions  . Lisinopril Cough    ROS Review of Systems  Constitutional: Negative.   HENT: Negative.   Eyes: Negative.   Respiratory: Negative.   Cardiovascular: Negative.   Gastrointestinal:  Negative.   Endocrine: Negative.   Genitourinary: Negative.   Musculoskeletal: Negative.   Skin:       Healed scars  Allergic/Immunologic: Negative.   Neurological: Positive for dizziness (occasional ) and headaches (occasional ).  Hematological: Negative.   Psychiatric/Behavioral: Negative.       Objective:    Physical Exam  Constitutional: He is oriented to person, place, and time. He appears well-developed and well-nourished.    HENT:  Head: Normocephalic and atraumatic.  cerumem impaction   Eyes: Conjunctivae are normal.  Neck: Normal range of motion. Neck supple.  Cardiovascular: Normal rate, regular rhythm, normal heart sounds and intact distal pulses.  Pulmonary/Chest: Effort normal and breath sounds normal.  Abdominal: Soft. Bowel sounds are normal.  Musculoskeletal: Normal range of motion.  Neurological: He is alert and oriented to person, place, and time. He has normal reflexes.  Skin: Skin is warm and dry.  Psychiatric: He has a normal mood and affect. His behavior is normal. Judgment and thought content normal.  Nursing note and vitals reviewed.   BP 128/78   Pulse 60   Temp 97.8 F (36.6 C) (Oral)   Ht 6\' 2"  (1.88 m)   Wt 189 lb 9.6 oz (86 kg)   SpO2 97%   BMI 24.34 kg/m  Wt Readings from Last 3 Encounters:  08/23/18 189 lb 9.6 oz (86 kg)  05/26/18 197 lb (89.4 kg)  03/23/18 180 lb (81.6 kg)     There are no preventive care reminders to display for this patient.  There are no preventive care reminders to display for this patient.  Lab Results  Component Value Date   TSH 1.600 05/26/2018   Lab Results  Component Value Date   WBC  3.8 08/23/2018   HGB 10.3 (L) 08/23/2018   HCT 30.2 (L) 08/23/2018   MCV 84 08/23/2018   PLT 196 08/23/2018   Lab Results  Component Value Date   NA 137 08/23/2018   K 4.2 08/23/2018   CO2 25 08/23/2018   GLUCOSE 99 08/23/2018   BUN 40 (H) 08/23/2018   CREATININE 7.16 (H) 08/23/2018   BILITOT 0.4 08/23/2018   ALKPHOS 49 08/23/2018   AST 6 08/23/2018   ALT 5 08/23/2018   PROT 6.6 08/23/2018   ALBUMIN 4.1 08/23/2018   CALCIUM 8.8 08/23/2018   ANIONGAP 14 11/18/2017   Lab Results  Component Value Date   CHOL 134 05/26/2018   Lab Results  Component Value Date   HDL 37 (L) 05/26/2018   Lab Results  Component Value Date   LDLCALC 78 05/26/2018   Lab Results  Component Value Date   TRIG 94 05/26/2018   Lab Results  Component Value Date   CHOLHDL 3.6 05/26/2018   Lab Results  Component Value Date   HGBA1C 4.9 01/17/2017      Assessment & Plan:   1. Essential hypertension The current medical regimen is effective; blood pressure is stable at 128/78 today; continue present plan and medications as prescribed. She will continue to decrease high sodium intake, excessive alcohol intake, increase potassium intake, smoking cessation, and increase physical activity of at least 30 minutes of cardio activity daily. She will continue to follow Heart Healthy or DASH diet. - CBC with Differential - Comprehensive metabolic panel  2. Bilateral impacted cerumen - Ambulatory referral to ENT  3. Hemodialysis patient [facility]) Tolerating treatments will. Continue treatments as prescribed.   4. History of elevated PSA Most recent PSA from [facility],  on 11/07/2017 was mildly elevated at 6.63. We will re-evaluate in a few months.    5. Follow up He will follow up in 6 months.  - POCT urinalysis dipstick  No orders of the defined types were placed in this encounter.   Orders Placed This Encounter  Procedures  . CBC with Differential  . Comprehensive  metabolic panel  . Ambulatory referral to ENT  . POCT urinalysis dipstick     Referral Orders     Ambulatory referral to ENT   Kathe Becton,  MSN, FNP-BC [place] [address] [place], [place] [address] [phone] fax    Problem List Items Addressed This Visit      Cardiovascular and Mediastinum   Essential hypertension - Primary   Relevant Orders   CBC with Differential (Completed)   Comprehensive metabolic panel (Completed)     Other   Hemodialysis patient [facility])    Other Visit Diagnoses    Bilateral impacted cerumen       Relevant Orders   Ambulatory referral to ENT   History of elevated PSA       Follow up       Relevant Orders   POCT urinalysis dipstick (Completed)      No orders of the defined types were placed in this encounter.   Follow-up: Return in about 6 months (around 02/23/2019).    [name], FNP

## 2018-08-24 ENCOUNTER — Encounter: Payer: Self-pay | Admitting: Family Medicine

## 2018-08-24 DIAGNOSIS — Z87898 Personal history of other specified conditions: Secondary | ICD-10-CM | POA: Insufficient documentation

## 2018-08-24 DIAGNOSIS — H6123 Impacted cerumen, bilateral: Secondary | ICD-10-CM | POA: Insufficient documentation

## 2018-08-24 LAB — COMPREHENSIVE METABOLIC PANEL
ALT: 5 IU/L (ref 0–44)
AST: 6 IU/L (ref 0–40)
Albumin/Globulin Ratio: 1.6 (ref 1.2–2.2)
Albumin: 4.1 g/dL (ref 3.8–4.9)
Alkaline Phosphatase: 49 IU/L (ref 39–117)
BUN/Creatinine Ratio: 6 — ABNORMAL LOW (ref 9–20)
BUN: 40 mg/dL — ABNORMAL HIGH (ref 6–24)
Bilirubin Total: 0.4 mg/dL (ref 0.0–1.2)
CO2: 25 mmol/L (ref 20–29)
Calcium: 8.8 mg/dL (ref 8.7–10.2)
Chloride: 97 mmol/L (ref 96–106)
Creatinine, Ser: 7.16 mg/dL — ABNORMAL HIGH (ref 0.76–1.27)
GFR calc Af Amer: 9 mL/min/{1.73_m2} — ABNORMAL LOW (ref >59)
GFR calc non Af Amer: 8 mL/min/{1.73_m2} — ABNORMAL LOW (ref >59)
Globulin, Total: 2.5 g/dL (ref 1.5–4.5)
Glucose: 99 mg/dL (ref 65–99)
Potassium: 4.2 mmol/L (ref 3.5–5.2)
Sodium: 137 mmol/L (ref 134–144)
Total Protein: 6.6 g/dL (ref 6.0–8.5)

## 2018-08-24 LAB — CBC WITH DIFFERENTIAL/PLATELET
Basophils Absolute: 0 10*3/uL (ref 0.0–0.2)
Basos: 1 %
EOS (ABSOLUTE): 0.7 10*3/uL — ABNORMAL HIGH (ref 0.0–0.4)
Eos: 19 %
Hematocrit: 30.2 % — ABNORMAL LOW (ref 37.5–51.0)
Hemoglobin: 10.3 g/dL — ABNORMAL LOW (ref 13.0–17.7)
Immature Grans (Abs): 0 10*3/uL (ref 0.0–0.1)
Immature Granulocytes: 0 %
Lymphocytes Absolute: 0.8 10*3/uL (ref 0.7–3.1)
Lymphs: 21 %
MCH: 28.5 pg (ref 26.6–33.0)
MCHC: 34.1 g/dL (ref 31.5–35.7)
MCV: 84 fL (ref 79–97)
Monocytes Absolute: 0.3 10*3/uL (ref 0.1–0.9)
Monocytes: 8 %
Neutrophils Absolute: 1.9 10*3/uL (ref 1.4–7.0)
Neutrophils: 51 %
Platelets: 196 10*3/uL (ref 150–450)
RBC: 3.61 x10E6/uL — ABNORMAL LOW (ref 4.14–5.80)
RDW: 12.2 % (ref 11.6–15.4)
WBC: 3.8 10*3/uL (ref 3.4–10.8)

## 2018-08-29 DIAGNOSIS — N186 End stage renal disease: Secondary | ICD-10-CM | POA: Diagnosis not present

## 2018-08-29 DIAGNOSIS — D631 Anemia in chronic kidney disease: Secondary | ICD-10-CM | POA: Diagnosis not present

## 2018-08-29 DIAGNOSIS — Z992 Dependence on renal dialysis: Secondary | ICD-10-CM | POA: Diagnosis not present

## 2018-08-29 DIAGNOSIS — D509 Iron deficiency anemia, unspecified: Secondary | ICD-10-CM | POA: Diagnosis not present

## 2018-08-29 DIAGNOSIS — N2581 Secondary hyperparathyroidism of renal origin: Secondary | ICD-10-CM | POA: Diagnosis not present

## 2018-08-31 DIAGNOSIS — N2581 Secondary hyperparathyroidism of renal origin: Secondary | ICD-10-CM | POA: Diagnosis not present

## 2018-08-31 DIAGNOSIS — D509 Iron deficiency anemia, unspecified: Secondary | ICD-10-CM | POA: Diagnosis not present

## 2018-08-31 DIAGNOSIS — D631 Anemia in chronic kidney disease: Secondary | ICD-10-CM | POA: Diagnosis not present

## 2018-08-31 DIAGNOSIS — N186 End stage renal disease: Secondary | ICD-10-CM | POA: Diagnosis not present

## 2018-08-31 DIAGNOSIS — Z992 Dependence on renal dialysis: Secondary | ICD-10-CM | POA: Diagnosis not present

## 2018-09-02 DIAGNOSIS — Z992 Dependence on renal dialysis: Secondary | ICD-10-CM | POA: Diagnosis not present

## 2018-09-02 DIAGNOSIS — D631 Anemia in chronic kidney disease: Secondary | ICD-10-CM | POA: Diagnosis not present

## 2018-09-02 DIAGNOSIS — N2581 Secondary hyperparathyroidism of renal origin: Secondary | ICD-10-CM | POA: Diagnosis not present

## 2018-09-02 DIAGNOSIS — D509 Iron deficiency anemia, unspecified: Secondary | ICD-10-CM | POA: Diagnosis not present

## 2018-09-02 DIAGNOSIS — N186 End stage renal disease: Secondary | ICD-10-CM | POA: Diagnosis not present

## 2018-09-05 DIAGNOSIS — D631 Anemia in chronic kidney disease: Secondary | ICD-10-CM | POA: Diagnosis not present

## 2018-09-05 DIAGNOSIS — N186 End stage renal disease: Secondary | ICD-10-CM | POA: Diagnosis not present

## 2018-09-05 DIAGNOSIS — Z992 Dependence on renal dialysis: Secondary | ICD-10-CM | POA: Diagnosis not present

## 2018-09-05 DIAGNOSIS — N2581 Secondary hyperparathyroidism of renal origin: Secondary | ICD-10-CM | POA: Diagnosis not present

## 2018-09-05 DIAGNOSIS — D509 Iron deficiency anemia, unspecified: Secondary | ICD-10-CM | POA: Diagnosis not present

## 2018-09-07 DIAGNOSIS — Z992 Dependence on renal dialysis: Secondary | ICD-10-CM | POA: Diagnosis not present

## 2018-09-07 DIAGNOSIS — D509 Iron deficiency anemia, unspecified: Secondary | ICD-10-CM | POA: Diagnosis not present

## 2018-09-07 DIAGNOSIS — N186 End stage renal disease: Secondary | ICD-10-CM | POA: Diagnosis not present

## 2018-09-07 DIAGNOSIS — N2581 Secondary hyperparathyroidism of renal origin: Secondary | ICD-10-CM | POA: Diagnosis not present

## 2018-09-07 DIAGNOSIS — D631 Anemia in chronic kidney disease: Secondary | ICD-10-CM | POA: Diagnosis not present

## 2018-09-09 DIAGNOSIS — Z992 Dependence on renal dialysis: Secondary | ICD-10-CM | POA: Diagnosis not present

## 2018-09-09 DIAGNOSIS — N2581 Secondary hyperparathyroidism of renal origin: Secondary | ICD-10-CM | POA: Diagnosis not present

## 2018-09-09 DIAGNOSIS — D631 Anemia in chronic kidney disease: Secondary | ICD-10-CM | POA: Diagnosis not present

## 2018-09-09 DIAGNOSIS — D509 Iron deficiency anemia, unspecified: Secondary | ICD-10-CM | POA: Diagnosis not present

## 2018-09-09 DIAGNOSIS — N186 End stage renal disease: Secondary | ICD-10-CM | POA: Diagnosis not present

## 2018-09-16 DIAGNOSIS — Z992 Dependence on renal dialysis: Secondary | ICD-10-CM | POA: Diagnosis not present

## 2018-09-16 DIAGNOSIS — D509 Iron deficiency anemia, unspecified: Secondary | ICD-10-CM | POA: Diagnosis not present

## 2018-09-16 DIAGNOSIS — D631 Anemia in chronic kidney disease: Secondary | ICD-10-CM | POA: Diagnosis not present

## 2018-09-16 DIAGNOSIS — N2581 Secondary hyperparathyroidism of renal origin: Secondary | ICD-10-CM | POA: Diagnosis not present

## 2018-09-16 DIAGNOSIS — N186 End stage renal disease: Secondary | ICD-10-CM | POA: Diagnosis not present

## 2018-09-20 DIAGNOSIS — T161XXA Foreign body in right ear, initial encounter: Secondary | ICD-10-CM | POA: Diagnosis not present

## 2018-09-20 DIAGNOSIS — H900 Conductive hearing loss, bilateral: Secondary | ICD-10-CM | POA: Diagnosis not present

## 2018-09-23 DIAGNOSIS — Z992 Dependence on renal dialysis: Secondary | ICD-10-CM | POA: Diagnosis not present

## 2018-09-23 DIAGNOSIS — D509 Iron deficiency anemia, unspecified: Secondary | ICD-10-CM | POA: Diagnosis not present

## 2018-09-23 DIAGNOSIS — D631 Anemia in chronic kidney disease: Secondary | ICD-10-CM | POA: Diagnosis not present

## 2018-09-23 DIAGNOSIS — N186 End stage renal disease: Secondary | ICD-10-CM | POA: Diagnosis not present

## 2018-09-23 DIAGNOSIS — N2581 Secondary hyperparathyroidism of renal origin: Secondary | ICD-10-CM | POA: Diagnosis not present

## 2018-09-30 DIAGNOSIS — D631 Anemia in chronic kidney disease: Secondary | ICD-10-CM | POA: Diagnosis not present

## 2018-09-30 DIAGNOSIS — D509 Iron deficiency anemia, unspecified: Secondary | ICD-10-CM | POA: Diagnosis not present

## 2018-09-30 DIAGNOSIS — Z992 Dependence on renal dialysis: Secondary | ICD-10-CM | POA: Diagnosis not present

## 2018-09-30 DIAGNOSIS — N186 End stage renal disease: Secondary | ICD-10-CM | POA: Diagnosis not present

## 2018-09-30 DIAGNOSIS — N2581 Secondary hyperparathyroidism of renal origin: Secondary | ICD-10-CM | POA: Diagnosis not present

## 2018-10-03 DIAGNOSIS — Z992 Dependence on renal dialysis: Secondary | ICD-10-CM | POA: Diagnosis not present

## 2018-10-03 DIAGNOSIS — D509 Iron deficiency anemia, unspecified: Secondary | ICD-10-CM | POA: Diagnosis not present

## 2018-10-03 DIAGNOSIS — N2581 Secondary hyperparathyroidism of renal origin: Secondary | ICD-10-CM | POA: Diagnosis not present

## 2018-10-03 DIAGNOSIS — D631 Anemia in chronic kidney disease: Secondary | ICD-10-CM | POA: Diagnosis not present

## 2018-10-03 DIAGNOSIS — N186 End stage renal disease: Secondary | ICD-10-CM | POA: Diagnosis not present

## 2018-10-07 DIAGNOSIS — N2581 Secondary hyperparathyroidism of renal origin: Secondary | ICD-10-CM | POA: Diagnosis not present

## 2018-10-07 DIAGNOSIS — D631 Anemia in chronic kidney disease: Secondary | ICD-10-CM | POA: Diagnosis not present

## 2018-10-07 DIAGNOSIS — Z992 Dependence on renal dialysis: Secondary | ICD-10-CM | POA: Diagnosis not present

## 2018-10-07 DIAGNOSIS — N186 End stage renal disease: Secondary | ICD-10-CM | POA: Diagnosis not present

## 2018-10-07 DIAGNOSIS — D509 Iron deficiency anemia, unspecified: Secondary | ICD-10-CM | POA: Diagnosis not present

## 2018-10-12 DIAGNOSIS — Z992 Dependence on renal dialysis: Secondary | ICD-10-CM | POA: Diagnosis not present

## 2018-10-12 DIAGNOSIS — I129 Hypertensive chronic kidney disease with stage 1 through stage 4 chronic kidney disease, or unspecified chronic kidney disease: Secondary | ICD-10-CM | POA: Diagnosis not present

## 2018-10-12 DIAGNOSIS — N186 End stage renal disease: Secondary | ICD-10-CM | POA: Diagnosis not present

## 2018-10-17 DIAGNOSIS — N2581 Secondary hyperparathyroidism of renal origin: Secondary | ICD-10-CM | POA: Diagnosis not present

## 2018-10-17 DIAGNOSIS — D631 Anemia in chronic kidney disease: Secondary | ICD-10-CM | POA: Diagnosis not present

## 2018-10-17 DIAGNOSIS — D509 Iron deficiency anemia, unspecified: Secondary | ICD-10-CM | POA: Diagnosis not present

## 2018-10-17 DIAGNOSIS — Z992 Dependence on renal dialysis: Secondary | ICD-10-CM | POA: Diagnosis not present

## 2018-10-17 DIAGNOSIS — N186 End stage renal disease: Secondary | ICD-10-CM | POA: Diagnosis not present

## 2018-10-28 DIAGNOSIS — N186 End stage renal disease: Secondary | ICD-10-CM | POA: Diagnosis not present

## 2018-10-28 DIAGNOSIS — D509 Iron deficiency anemia, unspecified: Secondary | ICD-10-CM | POA: Diagnosis not present

## 2018-10-28 DIAGNOSIS — Z992 Dependence on renal dialysis: Secondary | ICD-10-CM | POA: Diagnosis not present

## 2018-10-28 DIAGNOSIS — D631 Anemia in chronic kidney disease: Secondary | ICD-10-CM | POA: Diagnosis not present

## 2018-10-28 DIAGNOSIS — N2581 Secondary hyperparathyroidism of renal origin: Secondary | ICD-10-CM | POA: Diagnosis not present

## 2018-11-07 DIAGNOSIS — N186 End stage renal disease: Secondary | ICD-10-CM | POA: Diagnosis not present

## 2018-11-07 DIAGNOSIS — Z992 Dependence on renal dialysis: Secondary | ICD-10-CM | POA: Diagnosis not present

## 2018-11-07 DIAGNOSIS — D509 Iron deficiency anemia, unspecified: Secondary | ICD-10-CM | POA: Diagnosis not present

## 2018-11-07 DIAGNOSIS — N2581 Secondary hyperparathyroidism of renal origin: Secondary | ICD-10-CM | POA: Diagnosis not present

## 2018-11-07 DIAGNOSIS — D631 Anemia in chronic kidney disease: Secondary | ICD-10-CM | POA: Diagnosis not present

## 2018-11-09 DIAGNOSIS — D509 Iron deficiency anemia, unspecified: Secondary | ICD-10-CM | POA: Diagnosis not present

## 2018-11-09 DIAGNOSIS — D631 Anemia in chronic kidney disease: Secondary | ICD-10-CM | POA: Diagnosis not present

## 2018-11-09 DIAGNOSIS — N2581 Secondary hyperparathyroidism of renal origin: Secondary | ICD-10-CM | POA: Diagnosis not present

## 2018-11-09 DIAGNOSIS — N186 End stage renal disease: Secondary | ICD-10-CM | POA: Diagnosis not present

## 2018-11-09 DIAGNOSIS — Z992 Dependence on renal dialysis: Secondary | ICD-10-CM | POA: Diagnosis not present

## 2018-11-11 DIAGNOSIS — N2581 Secondary hyperparathyroidism of renal origin: Secondary | ICD-10-CM | POA: Diagnosis not present

## 2018-11-11 DIAGNOSIS — D631 Anemia in chronic kidney disease: Secondary | ICD-10-CM | POA: Diagnosis not present

## 2018-11-11 DIAGNOSIS — N186 End stage renal disease: Secondary | ICD-10-CM | POA: Diagnosis not present

## 2018-11-11 DIAGNOSIS — Z992 Dependence on renal dialysis: Secondary | ICD-10-CM | POA: Diagnosis not present

## 2018-11-11 DIAGNOSIS — D509 Iron deficiency anemia, unspecified: Secondary | ICD-10-CM | POA: Diagnosis not present

## 2018-11-12 DIAGNOSIS — Z992 Dependence on renal dialysis: Secondary | ICD-10-CM | POA: Diagnosis not present

## 2018-11-12 DIAGNOSIS — I129 Hypertensive chronic kidney disease with stage 1 through stage 4 chronic kidney disease, or unspecified chronic kidney disease: Secondary | ICD-10-CM | POA: Diagnosis not present

## 2018-11-12 DIAGNOSIS — N186 End stage renal disease: Secondary | ICD-10-CM | POA: Diagnosis not present

## 2018-11-14 DIAGNOSIS — D509 Iron deficiency anemia, unspecified: Secondary | ICD-10-CM | POA: Diagnosis not present

## 2018-11-14 DIAGNOSIS — N186 End stage renal disease: Secondary | ICD-10-CM | POA: Diagnosis not present

## 2018-11-14 DIAGNOSIS — D631 Anemia in chronic kidney disease: Secondary | ICD-10-CM | POA: Diagnosis not present

## 2018-11-14 DIAGNOSIS — Z992 Dependence on renal dialysis: Secondary | ICD-10-CM | POA: Diagnosis not present

## 2018-11-14 DIAGNOSIS — N2581 Secondary hyperparathyroidism of renal origin: Secondary | ICD-10-CM | POA: Diagnosis not present

## 2018-11-16 DIAGNOSIS — Z992 Dependence on renal dialysis: Secondary | ICD-10-CM | POA: Diagnosis not present

## 2018-11-16 DIAGNOSIS — D631 Anemia in chronic kidney disease: Secondary | ICD-10-CM | POA: Diagnosis not present

## 2018-11-16 DIAGNOSIS — N186 End stage renal disease: Secondary | ICD-10-CM | POA: Diagnosis not present

## 2018-11-16 DIAGNOSIS — N2581 Secondary hyperparathyroidism of renal origin: Secondary | ICD-10-CM | POA: Diagnosis not present

## 2018-11-16 DIAGNOSIS — D509 Iron deficiency anemia, unspecified: Secondary | ICD-10-CM | POA: Diagnosis not present

## 2018-11-21 DIAGNOSIS — Z992 Dependence on renal dialysis: Secondary | ICD-10-CM | POA: Diagnosis not present

## 2018-11-21 DIAGNOSIS — N186 End stage renal disease: Secondary | ICD-10-CM | POA: Diagnosis not present

## 2018-11-21 DIAGNOSIS — D509 Iron deficiency anemia, unspecified: Secondary | ICD-10-CM | POA: Diagnosis not present

## 2018-11-21 DIAGNOSIS — D631 Anemia in chronic kidney disease: Secondary | ICD-10-CM | POA: Diagnosis not present

## 2018-11-21 DIAGNOSIS — N2581 Secondary hyperparathyroidism of renal origin: Secondary | ICD-10-CM | POA: Diagnosis not present

## 2018-11-23 DIAGNOSIS — D509 Iron deficiency anemia, unspecified: Secondary | ICD-10-CM | POA: Diagnosis not present

## 2018-11-23 DIAGNOSIS — N2581 Secondary hyperparathyroidism of renal origin: Secondary | ICD-10-CM | POA: Diagnosis not present

## 2018-11-23 DIAGNOSIS — Z992 Dependence on renal dialysis: Secondary | ICD-10-CM | POA: Diagnosis not present

## 2018-11-23 DIAGNOSIS — N186 End stage renal disease: Secondary | ICD-10-CM | POA: Diagnosis not present

## 2018-11-23 DIAGNOSIS — D631 Anemia in chronic kidney disease: Secondary | ICD-10-CM | POA: Diagnosis not present

## 2018-11-25 DIAGNOSIS — D509 Iron deficiency anemia, unspecified: Secondary | ICD-10-CM | POA: Diagnosis not present

## 2018-11-25 DIAGNOSIS — D631 Anemia in chronic kidney disease: Secondary | ICD-10-CM | POA: Diagnosis not present

## 2018-11-25 DIAGNOSIS — Z992 Dependence on renal dialysis: Secondary | ICD-10-CM | POA: Diagnosis not present

## 2018-11-25 DIAGNOSIS — N186 End stage renal disease: Secondary | ICD-10-CM | POA: Diagnosis not present

## 2018-11-25 DIAGNOSIS — N2581 Secondary hyperparathyroidism of renal origin: Secondary | ICD-10-CM | POA: Diagnosis not present

## 2018-11-28 DIAGNOSIS — N186 End stage renal disease: Secondary | ICD-10-CM | POA: Diagnosis not present

## 2018-11-28 DIAGNOSIS — D631 Anemia in chronic kidney disease: Secondary | ICD-10-CM | POA: Diagnosis not present

## 2018-11-28 DIAGNOSIS — N2581 Secondary hyperparathyroidism of renal origin: Secondary | ICD-10-CM | POA: Diagnosis not present

## 2018-11-28 DIAGNOSIS — Z992 Dependence on renal dialysis: Secondary | ICD-10-CM | POA: Diagnosis not present

## 2018-11-28 DIAGNOSIS — D509 Iron deficiency anemia, unspecified: Secondary | ICD-10-CM | POA: Diagnosis not present

## 2018-12-02 DIAGNOSIS — D631 Anemia in chronic kidney disease: Secondary | ICD-10-CM | POA: Diagnosis not present

## 2018-12-02 DIAGNOSIS — N2581 Secondary hyperparathyroidism of renal origin: Secondary | ICD-10-CM | POA: Diagnosis not present

## 2018-12-02 DIAGNOSIS — N186 End stage renal disease: Secondary | ICD-10-CM | POA: Diagnosis not present

## 2018-12-02 DIAGNOSIS — D509 Iron deficiency anemia, unspecified: Secondary | ICD-10-CM | POA: Diagnosis not present

## 2018-12-02 DIAGNOSIS — Z992 Dependence on renal dialysis: Secondary | ICD-10-CM | POA: Diagnosis not present

## 2018-12-06 DIAGNOSIS — N186 End stage renal disease: Secondary | ICD-10-CM | POA: Diagnosis not present

## 2018-12-06 DIAGNOSIS — D631 Anemia in chronic kidney disease: Secondary | ICD-10-CM | POA: Diagnosis not present

## 2018-12-06 DIAGNOSIS — D509 Iron deficiency anemia, unspecified: Secondary | ICD-10-CM | POA: Diagnosis not present

## 2018-12-06 DIAGNOSIS — N2581 Secondary hyperparathyroidism of renal origin: Secondary | ICD-10-CM | POA: Diagnosis not present

## 2018-12-06 DIAGNOSIS — Z992 Dependence on renal dialysis: Secondary | ICD-10-CM | POA: Diagnosis not present

## 2019-01-12 DIAGNOSIS — N186 End stage renal disease: Secondary | ICD-10-CM | POA: Diagnosis not present

## 2019-01-12 DIAGNOSIS — Z992 Dependence on renal dialysis: Secondary | ICD-10-CM | POA: Diagnosis not present

## 2019-01-12 DIAGNOSIS — I129 Hypertensive chronic kidney disease with stage 1 through stage 4 chronic kidney disease, or unspecified chronic kidney disease: Secondary | ICD-10-CM | POA: Diagnosis not present

## 2019-01-13 DIAGNOSIS — D631 Anemia in chronic kidney disease: Secondary | ICD-10-CM | POA: Diagnosis not present

## 2019-01-13 DIAGNOSIS — N2581 Secondary hyperparathyroidism of renal origin: Secondary | ICD-10-CM | POA: Diagnosis not present

## 2019-01-13 DIAGNOSIS — D509 Iron deficiency anemia, unspecified: Secondary | ICD-10-CM | POA: Diagnosis not present

## 2019-01-13 DIAGNOSIS — Z992 Dependence on renal dialysis: Secondary | ICD-10-CM | POA: Diagnosis not present

## 2019-01-13 DIAGNOSIS — N186 End stage renal disease: Secondary | ICD-10-CM | POA: Diagnosis not present

## 2019-01-16 DIAGNOSIS — N2581 Secondary hyperparathyroidism of renal origin: Secondary | ICD-10-CM | POA: Diagnosis not present

## 2019-01-16 DIAGNOSIS — D509 Iron deficiency anemia, unspecified: Secondary | ICD-10-CM | POA: Diagnosis not present

## 2019-01-16 DIAGNOSIS — N186 End stage renal disease: Secondary | ICD-10-CM | POA: Diagnosis not present

## 2019-01-16 DIAGNOSIS — Z992 Dependence on renal dialysis: Secondary | ICD-10-CM | POA: Diagnosis not present

## 2019-01-16 DIAGNOSIS — D631 Anemia in chronic kidney disease: Secondary | ICD-10-CM | POA: Diagnosis not present

## 2019-01-18 DIAGNOSIS — D509 Iron deficiency anemia, unspecified: Secondary | ICD-10-CM | POA: Diagnosis not present

## 2019-01-18 DIAGNOSIS — N186 End stage renal disease: Secondary | ICD-10-CM | POA: Diagnosis not present

## 2019-01-18 DIAGNOSIS — N2581 Secondary hyperparathyroidism of renal origin: Secondary | ICD-10-CM | POA: Diagnosis not present

## 2019-01-18 DIAGNOSIS — Z992 Dependence on renal dialysis: Secondary | ICD-10-CM | POA: Diagnosis not present

## 2019-01-18 DIAGNOSIS — D631 Anemia in chronic kidney disease: Secondary | ICD-10-CM | POA: Diagnosis not present

## 2019-01-20 DIAGNOSIS — D631 Anemia in chronic kidney disease: Secondary | ICD-10-CM | POA: Diagnosis not present

## 2019-01-20 DIAGNOSIS — D509 Iron deficiency anemia, unspecified: Secondary | ICD-10-CM | POA: Diagnosis not present

## 2019-01-20 DIAGNOSIS — N2581 Secondary hyperparathyroidism of renal origin: Secondary | ICD-10-CM | POA: Diagnosis not present

## 2019-01-20 DIAGNOSIS — Z992 Dependence on renal dialysis: Secondary | ICD-10-CM | POA: Diagnosis not present

## 2019-01-20 DIAGNOSIS — N186 End stage renal disease: Secondary | ICD-10-CM | POA: Diagnosis not present

## 2019-01-25 DIAGNOSIS — Z992 Dependence on renal dialysis: Secondary | ICD-10-CM | POA: Diagnosis not present

## 2019-01-25 DIAGNOSIS — N186 End stage renal disease: Secondary | ICD-10-CM | POA: Diagnosis not present

## 2019-01-25 DIAGNOSIS — D509 Iron deficiency anemia, unspecified: Secondary | ICD-10-CM | POA: Diagnosis not present

## 2019-01-25 DIAGNOSIS — N2581 Secondary hyperparathyroidism of renal origin: Secondary | ICD-10-CM | POA: Diagnosis not present

## 2019-01-25 DIAGNOSIS — D631 Anemia in chronic kidney disease: Secondary | ICD-10-CM | POA: Diagnosis not present

## 2019-01-30 ENCOUNTER — Telehealth: Payer: Self-pay | Admitting: *Deleted

## 2019-01-30 DIAGNOSIS — N2581 Secondary hyperparathyroidism of renal origin: Secondary | ICD-10-CM | POA: Diagnosis not present

## 2019-01-30 DIAGNOSIS — D631 Anemia in chronic kidney disease: Secondary | ICD-10-CM | POA: Diagnosis not present

## 2019-01-30 DIAGNOSIS — Z992 Dependence on renal dialysis: Secondary | ICD-10-CM | POA: Diagnosis not present

## 2019-01-30 DIAGNOSIS — N186 End stage renal disease: Secondary | ICD-10-CM | POA: Diagnosis not present

## 2019-01-30 DIAGNOSIS — D509 Iron deficiency anemia, unspecified: Secondary | ICD-10-CM | POA: Diagnosis not present

## 2019-01-30 NOTE — Telephone Encounter (Signed)
Jason [name] will call back later to schedule his follow up visit.

## 2019-02-03 ENCOUNTER — Other Ambulatory Visit: Payer: Self-pay

## 2019-02-03 ENCOUNTER — Emergency Department (HOSPITAL_COMMUNITY)
Admission: EM | Admit: 2019-02-03 | Discharge: 2019-02-03 | Disposition: A | Discharge status: Home / Self Care | Payer: Medicare Other | Attending: Emergency Medicine | Admitting: Emergency Medicine

## 2019-02-03 DIAGNOSIS — N2581 Secondary hyperparathyroidism of renal origin: Secondary | ICD-10-CM | POA: Diagnosis not present

## 2019-02-03 DIAGNOSIS — I132 Hypertensive heart and chronic kidney disease with heart failure and with stage 5 chronic kidney disease, or end stage renal disease: Secondary | ICD-10-CM | POA: Insufficient documentation

## 2019-02-03 DIAGNOSIS — I5032 Chronic diastolic (congestive) heart failure: Secondary | ICD-10-CM | POA: Insufficient documentation

## 2019-02-03 DIAGNOSIS — D509 Iron deficiency anemia, unspecified: Secondary | ICD-10-CM | POA: Diagnosis not present

## 2019-02-03 DIAGNOSIS — R0902 Hypoxemia: Secondary | ICD-10-CM | POA: Diagnosis not present

## 2019-02-03 DIAGNOSIS — Z992 Dependence on renal dialysis: Secondary | ICD-10-CM | POA: Diagnosis not present

## 2019-02-03 DIAGNOSIS — T82838A Hemorrhage of vascular prosthetic devices, implants and grafts, initial encounter: Secondary | ICD-10-CM | POA: Diagnosis not present

## 2019-02-03 DIAGNOSIS — I10 Essential (primary) hypertension: Secondary | ICD-10-CM | POA: Diagnosis not present

## 2019-02-03 DIAGNOSIS — Z87891 Personal history of nicotine dependence: Secondary | ICD-10-CM | POA: Diagnosis not present

## 2019-02-03 DIAGNOSIS — D631 Anemia in chronic kidney disease: Secondary | ICD-10-CM | POA: Diagnosis not present

## 2019-02-03 DIAGNOSIS — N186 End stage renal disease: Secondary | ICD-10-CM | POA: Insufficient documentation

## 2019-02-03 DIAGNOSIS — T829XXA Unspecified complication of cardiac and vascular prosthetic device, implant and graft, initial encounter: Secondary | ICD-10-CM

## 2019-02-03 DIAGNOSIS — Z79899 Other long term (current) drug therapy: Secondary | ICD-10-CM | POA: Diagnosis not present

## 2019-02-03 DIAGNOSIS — Y712 Prosthetic and other implants, materials and accessory cardiovascular devices associated with adverse incidents: Secondary | ICD-10-CM | POA: Insufficient documentation

## 2019-02-03 DIAGNOSIS — R58 Hemorrhage, not elsewhere classified: Secondary | ICD-10-CM | POA: Diagnosis not present

## 2019-02-03 NOTE — ED Triage Notes (Signed)
Pt completed HD and went home. About 1.5 hours after pt took bandage off and it began to bleed uncontrollably. Pt alert and oriented X4. Bleeding controlled at triage.

## 2019-02-03 NOTE — ED Notes (Signed)
Patient verbalizes understanding of discharge instructions. Opportunity for questioning and answers were provided. Armband removed by staff, pt discharged from ED ambulatory.   

## 2019-02-03 NOTE — ED Provider Notes (Signed)
[facility] DEPART[facility] Provider Note   CSN: [id number] Arrival date & time: 02/03/19  1848     History No chief complaint on file.   Jason Parke. is a 59 y.o. male who presents with a bleeding fistula. The patient states that he went to dialysis and completed a full session. He went home and took his bandage off and the blood started spurting out. He grabbed a t-shirt and called 911. When they got there the bleeding had slowed. By the time he got to the ED bleeding has stopped. Currently he reports he feels fine. He has not had any other problems with the fistula.   HPI     Past Medical History:  Diagnosis Date  . A-V fistula (HCC)    Right arm  . Abdominal hernia   . AKI (acute kidney injury) ([place]) 01/2015  . Anasarca   . Anemia   . Asthma    as a child  . Bilateral inguinal hernia   . Bilateral pleural effusion 01/2017  . Cellulitis 01/16/2017   Bilateral lower extremity  . CHF (congestive heart failure) ([place])   . Chronic venous insufficiency   . Concentric left ventricular hypertrophy 08/17/2017   Moderated, noted on ECHO  . Diastolic dysfunction 25/42/7062   noted on ECHO  . Elevated LFTs    per patient, resolved  . End stage renal disease [facility])    Dialysis T/Th/Sa  Started 02/2017/pt is waiting for a kidney transplant  . H/O right inguinal hernia repair 11/2017  . History of colonoscopy 03/20/2018  . History of elevated PSA 10/2017  . Hypertension   . Pneumonia   . Pulmonary hypertension (HCC)    Moderate  . Venous stasis ulcer ([place])    bil legs/ healed up now per pt (03/06/18)    Patient Active Problem List   Diagnosis Date Noted  . Bilateral impacted cerumen 08/24/2018  . History of elevated PSA 08/24/2018  . Hemodialysis patient ([place]) 05/28/2018  . Vitamin D deficiency 05/28/2018  . Left inguinal hernia s/p lap repair w mesh 11/18/2017 11/18/2017  . Scrotal hernia, right, s/p lap repair w mesh 11/18/2017 05/30/2017  .  Arteriovenous fistula (Right arm) for dialysis 05/30/2017  . Umbilical hernia s/p primary repair 11/18/2017 05/30/2017  . Anasarca 02/18/2017  . ESRD (end stage renal disease) ([place])   . NSVT (nonsustained ventricular tachycardia) ([place])   . Evaluation by psychiatric service required 01/19/2017  . Elevated troponin 01/16/2017  . Acute CHF (congestive heart failure) ([place]) 01/16/2017  . Venous stasis ulcer ([place]) 01/16/2017  . Essential hypertension 02/26/2015  . Abnormal EKG 01/30/2015  . Anemia 01/29/2015    Past Surgical History:  Procedure Laterality Date  . AV FISTULA PLACEMENT Right 01/20/2017   Procedure: ARTERIOVENOUS (AV) FISTULA CREATION;  Surgeon: Angelia Mould, MD;  Location: [facility];  Service: Vascular;  Laterality: Right;  . HERNIA REPAIR    . INSERTION OF DIALYSIS CATHETER Right 01/20/2017   Procedure: INSERTION OF DIALYSIS CATHETER;  Surgeon: Angelia Mould, MD;  Location: [facility];  Service: Vascular;  Laterality: Right;  . INSERTION OF MESH N/A 11/18/2017   Procedure: INSERTION OF MESH;  Surgeon: Michael Boston, MD;  Location: [facility];  Service: General;  Laterality: N/A;  . IR FLUORO GUIDE CV LINE RIGHT  01/18/2017  . IR US GUIDE VASC ACCESS RIGHT  01/18/2017  . LAPAROSCOPIC INGUINAL HERNIA WITH UMBILICAL HERNIA Bilateral 11/18/2017   Procedure: LAPAROSCOPIC BILATERAL INGUINAL HERNIA REPAIR WITH  UMBILICAL HERNIA REPAIR WITH MESH;  Surgeon: Michael Boston, MD;  Location: [facility];  Service: General;  Laterality: Bilateral;  . TOE SURGERY     right fracture in big toe  . TONSILLECTOMY         Family History  Problem Relation Age of Onset  . Heart attack Mother 106       CABG hx  . Heart disease Mother   . Hypertension Mother   . Stroke Mother   . Diabetes Father   . Healthy Brother   . Prostate cancer Maternal Grandfather   . Colon cancer Neg Hx   . Colon polyps Neg Hx   . Esophageal cancer Neg Hx   . Stomach cancer Neg Hx   . Rectal cancer Neg Hx      Social History   Tobacco Use  . Smoking status: Former Smoker    Years: 17.00    Quit date: 1997    Years since quitting: 24.0  . Smokeless tobacco: Never Used  . Tobacco comment: quit smoking in 1997  Substance Use Topics  . Alcohol use: Not Currently    Comment: every several months, rare  . Drug use: Not Currently    Comment: marijuana    Home Medications Prior to Admission medications   Medication Sig Start Date End Date Taking? Authorizing Provider  amLODipine (NORVASC) 10 MG tablet Take 10 mg by mouth every evening. 06/20/18   [provider]  carvedilol (COREG) 25 MG tablet Take 1 tablet (25 mg total) by mouth 2 (two) times daily with a meal. 02/02/18   [name], FNP  lidocaine-prilocaine (EMLA) cream Apply 1 application topically See admin instructions. Apply 1-2 hours before dialysis. Cover with occlusive dressing 3 days weekly (Tuesday, Thursday, & Saturdays) 06/07/17   [provider]  multivitamin (RENA-VIT) TABS tablet Take 1 tablet by mouth daily. 10/20/17   [provider]  NON FORMULARY Vit d IV-prior to dialysis 3 times a week ( per pt)    [provider]  OVER THE COUNTER MEDICATION Renadyl - Take 2 capsules by mouth daily.    [provider]    Allergies    Lisinopril  Review of Systems   Review of Systems  Cardiovascular:       +bleeding fistula  Neurological: Negative for dizziness.  Hematological: Does not bruise/bleed easily.  All other systems reviewed and are negative.   Physical Exam Updated Vital Signs BP (!) 159/101 (BP Location: Left Arm)   Pulse 69   Temp 97.9 F (36.6 C) (Oral)   Resp 16   Ht 6\' 2"  (1.88 m)   Wt 83.9 kg   SpO2 95%   BMI 23.75 kg/m   Physical Exam Vitals and nursing note reviewed.  Constitutional:      General: He is not in acute distress.    Appearance: Normal appearance. He is well-developed. He is not ill-appearing.  HENT:     Head: Normocephalic and  atraumatic.  Eyes:     General: No scleral icterus.       Right eye: No discharge.        Left eye: No discharge.     Conjunctiva/sclera: Conjunctivae normal.     Pupils: Pupils are equal, round, and reactive to light.  Cardiovascular:     Rate and Rhythm: Normal rate.  Pulmonary:     Effort: Pulmonary effort is normal. No respiratory distress.  Abdominal:     General: There is no distension.  Musculoskeletal:     Cervical back: Normal range of motion.     Comments: Fistula in RUE with palpable thrill. Bandage removed and there is no bleeding  Skin:    General: Skin is warm and dry.  Neurological:     Mental Status: He is alert and oriented to person, place, and time.  Psychiatric:        Behavior: Behavior normal.     ED Results / Procedures / Treatments   Labs (all labs ordered are listed, but only abnormal results are displayed) Labs Reviewed - No data to display  EKG None  Radiology No results found.  Procedures Procedures (including critical care time)  Medications Ordered in ED Medications - No data to display  ED Course  I have reviewed the triage vital signs and the nursing notes.  Pertinent labs & imaging results that were available during my care of the patient were reviewed by me and considered in my medical decision making (see chart for details).  59 year old male with ESRD on dialysis presents with bleeding fistula. He is hypertensive but otherwise vitals are normal. He has no complaints on exam. It sounds like there was not a significant amount of bleeding at home. Do not feel we need to check labs today. Pt was observed for 2 hours and there has been no recurrence of bleeding. Will d/c.  MDM Rules/Calculators/A&P                       Final Clinical Impression(s) / ED Diagnoses Final diagnoses:  Complication of vascular access for dialysis, initial encounter    Rx / DC Orders ED Discharge Orders    None       Recardo Evangelist,  PA-C 02/04/19 1551    Lajean Saver, MD 02/04/19 [phone]

## 2019-02-06 DIAGNOSIS — N186 End stage renal disease: Secondary | ICD-10-CM | POA: Diagnosis not present

## 2019-02-06 DIAGNOSIS — D631 Anemia in chronic kidney disease: Secondary | ICD-10-CM | POA: Diagnosis not present

## 2019-02-06 DIAGNOSIS — Z992 Dependence on renal dialysis: Secondary | ICD-10-CM | POA: Diagnosis not present

## 2019-02-06 DIAGNOSIS — D509 Iron deficiency anemia, unspecified: Secondary | ICD-10-CM | POA: Diagnosis not present

## 2019-02-06 DIAGNOSIS — N2581 Secondary hyperparathyroidism of renal origin: Secondary | ICD-10-CM | POA: Diagnosis not present

## 2019-02-10 DIAGNOSIS — D509 Iron deficiency anemia, unspecified: Secondary | ICD-10-CM | POA: Diagnosis not present

## 2019-02-10 DIAGNOSIS — D631 Anemia in chronic kidney disease: Secondary | ICD-10-CM | POA: Diagnosis not present

## 2019-02-10 DIAGNOSIS — Z992 Dependence on renal dialysis: Secondary | ICD-10-CM | POA: Diagnosis not present

## 2019-02-10 DIAGNOSIS — N2581 Secondary hyperparathyroidism of renal origin: Secondary | ICD-10-CM | POA: Diagnosis not present

## 2019-02-10 DIAGNOSIS — N186 End stage renal disease: Secondary | ICD-10-CM | POA: Diagnosis not present

## 2019-02-12 DIAGNOSIS — N186 End stage renal disease: Secondary | ICD-10-CM | POA: Diagnosis not present

## 2019-02-12 DIAGNOSIS — Z992 Dependence on renal dialysis: Secondary | ICD-10-CM | POA: Diagnosis not present

## 2019-02-12 DIAGNOSIS — I129 Hypertensive chronic kidney disease with stage 1 through stage 4 chronic kidney disease, or unspecified chronic kidney disease: Secondary | ICD-10-CM | POA: Diagnosis not present

## 2019-02-17 ENCOUNTER — Emergency Department (HOSPITAL_COMMUNITY): Payer: Medicare Other

## 2019-02-17 ENCOUNTER — Encounter (HOSPITAL_COMMUNITY): Payer: Self-pay

## 2019-02-17 ENCOUNTER — Inpatient Hospital Stay (HOSPITAL_COMMUNITY)
Admission: EM | Admit: 2019-02-17 | Discharge: 2019-02-19 | DRG: 304 | Disposition: A | Discharge status: Home / Self Care | Payer: Medicare Other | Attending: Internal Medicine | Admitting: Internal Medicine

## 2019-02-17 ENCOUNTER — Other Ambulatory Visit: Payer: Self-pay

## 2019-02-17 DIAGNOSIS — D649 Anemia, unspecified: Secondary | ICD-10-CM

## 2019-02-17 DIAGNOSIS — I272 Pulmonary hypertension, unspecified: Secondary | ICD-10-CM | POA: Diagnosis present

## 2019-02-17 DIAGNOSIS — I160 Hypertensive urgency: Secondary | ICD-10-CM | POA: Diagnosis present

## 2019-02-17 DIAGNOSIS — I161 Hypertensive emergency: Secondary | ICD-10-CM | POA: Diagnosis not present

## 2019-02-17 DIAGNOSIS — D631 Anemia in chronic kidney disease: Secondary | ICD-10-CM | POA: Diagnosis not present

## 2019-02-17 DIAGNOSIS — R600 Localized edema: Secondary | ICD-10-CM | POA: Diagnosis not present

## 2019-02-17 DIAGNOSIS — Z20822 Contact with and (suspected) exposure to covid-19: Secondary | ICD-10-CM | POA: Diagnosis present

## 2019-02-17 DIAGNOSIS — I674 Hypertensive encephalopathy: Secondary | ICD-10-CM | POA: Diagnosis not present

## 2019-02-17 DIAGNOSIS — G4440 Drug-induced headache, not elsewhere classified, not intractable: Secondary | ICD-10-CM | POA: Diagnosis present

## 2019-02-17 DIAGNOSIS — N2581 Secondary hyperparathyroidism of renal origin: Secondary | ICD-10-CM | POA: Diagnosis present

## 2019-02-17 DIAGNOSIS — I5042 Chronic combined systolic (congestive) and diastolic (congestive) heart failure: Secondary | ICD-10-CM | POA: Diagnosis present

## 2019-02-17 DIAGNOSIS — Z9119 Patient's noncompliance with other medical treatment and regimen: Secondary | ICD-10-CM

## 2019-02-17 DIAGNOSIS — R778 Other specified abnormalities of plasma proteins: Secondary | ICD-10-CM | POA: Diagnosis not present

## 2019-02-17 DIAGNOSIS — Z992 Dependence on renal dialysis: Secondary | ICD-10-CM

## 2019-02-17 DIAGNOSIS — D509 Iron deficiency anemia, unspecified: Secondary | ICD-10-CM | POA: Diagnosis not present

## 2019-02-17 DIAGNOSIS — Z87891 Personal history of nicotine dependence: Secondary | ICD-10-CM

## 2019-02-17 DIAGNOSIS — Z833 Family history of diabetes mellitus: Secondary | ICD-10-CM

## 2019-02-17 DIAGNOSIS — I10 Essential (primary) hypertension: Secondary | ICD-10-CM | POA: Diagnosis not present

## 2019-02-17 DIAGNOSIS — R0902 Hypoxemia: Secondary | ICD-10-CM | POA: Diagnosis not present

## 2019-02-17 DIAGNOSIS — R4182 Altered mental status, unspecified: Secondary | ICD-10-CM | POA: Diagnosis not present

## 2019-02-17 DIAGNOSIS — N186 End stage renal disease: Secondary | ICD-10-CM

## 2019-02-17 DIAGNOSIS — Z79899 Other long term (current) drug therapy: Secondary | ICD-10-CM

## 2019-02-17 DIAGNOSIS — T463X5A Adverse effect of coronary vasodilators, initial encounter: Secondary | ICD-10-CM | POA: Diagnosis present

## 2019-02-17 DIAGNOSIS — D638 Anemia in other chronic diseases classified elsewhere: Secondary | ICD-10-CM | POA: Diagnosis present

## 2019-02-17 DIAGNOSIS — I132 Hypertensive heart and chronic kidney disease with heart failure and with stage 5 chronic kidney disease, or end stage renal disease: Secondary | ICD-10-CM | POA: Diagnosis present

## 2019-02-17 DIAGNOSIS — J810 Acute pulmonary edema: Secondary | ICD-10-CM

## 2019-02-17 DIAGNOSIS — R0989 Other specified symptoms and signs involving the circulatory and respiratory systems: Secondary | ICD-10-CM | POA: Diagnosis not present

## 2019-02-17 LAB — CBC WITH DIFFERENTIAL/PLATELET
Abs Immature Granulocytes: 0.01 10*3/uL (ref 0.00–0.07)
Basophils Absolute: 0.1 10*3/uL (ref 0.0–0.1)
Basophils Relative: 1 %
Eosinophils Absolute: 0.5 10*3/uL (ref 0.0–0.5)
Eosinophils Relative: 12 %
HCT: 29.9 % — ABNORMAL LOW (ref 39.0–52.0)
Hemoglobin: 9.8 g/dL — ABNORMAL LOW (ref 13.0–17.0)
Immature Granulocytes: 0 %
Lymphocytes Relative: 16 %
Lymphs Abs: 0.7 10*3/uL (ref 0.7–4.0)
MCH: 29.3 pg (ref 26.0–34.0)
MCHC: 32.8 g/dL (ref 30.0–36.0)
MCV: 89.5 fL (ref 80.0–100.0)
Monocytes Absolute: 0.3 10*3/uL (ref 0.1–1.0)
Monocytes Relative: 8 %
Neutro Abs: 2.6 10*3/uL (ref 1.7–7.7)
Neutrophils Relative %: 63 %
Platelets: 145 10*3/uL — ABNORMAL LOW (ref 150–400)
RBC: 3.34 MIL/uL — ABNORMAL LOW (ref 4.22–5.81)
RDW: 12.5 % (ref 11.5–15.5)
WBC: 4.2 10*3/uL (ref 4.0–10.5)
nRBC: 0 % (ref 0.0–0.2)

## 2019-02-17 LAB — COMPREHENSIVE METABOLIC PANEL
ALT: 31 U/L (ref 0–44)
AST: 18 U/L (ref 15–41)
Albumin: 3.6 g/dL (ref 3.5–5.0)
Alkaline Phosphatase: 34 U/L — ABNORMAL LOW (ref 38–126)
Anion gap: 13 (ref 5–15)
BUN: 50 mg/dL — ABNORMAL HIGH (ref 6–20)
CO2: 26 mmol/L (ref 22–32)
Calcium: 10.1 mg/dL (ref 8.9–10.3)
Chloride: 100 mmol/L (ref 98–111)
Creatinine, Ser: 8.41 mg/dL — ABNORMAL HIGH (ref 0.61–1.24)
GFR calc Af Amer: 7 mL/min — ABNORMAL LOW (ref >60)
GFR calc non Af Amer: 6 mL/min — ABNORMAL LOW (ref >60)
Glucose, Bld: 91 mg/dL (ref 70–99)
Potassium: 3.8 mmol/L (ref 3.5–5.1)
Sodium: 139 mmol/L (ref 135–145)
Total Bilirubin: 1.4 mg/dL — ABNORMAL HIGH (ref 0.3–1.2)
Total Protein: 6.7 g/dL (ref 6.5–8.1)

## 2019-02-17 LAB — TROPONIN I (HIGH SENSITIVITY)
Troponin I (High Sensitivity): 77 ng/L — ABNORMAL HIGH (ref <18)
Troponin I (High Sensitivity): 67 ng/L — ABNORMAL HIGH (ref <18)

## 2019-02-17 LAB — RESPIRATORY PANEL BY RT PCR (FLU A&B, COVID)
Influenza A by PCR: NEGATIVE
Influenza B by PCR: NEGATIVE
SARS Coronavirus 2 by RT PCR: NEGATIVE

## 2019-02-17 IMAGING — CT CT HEAD W/O CM
4 series · 16 of 47 positions shown, 18 images · non-contrast
Comparison: CT head dated [DATE].

CLINICAL DATA: Subarachnoid hemorrhage suspected. Altered mental
status status post dialysis.

EXAM:
CT HEAD WITHOUT CONTRAST
TECHNIQUE: Contiguous axial images were obtained from the base of the skull
through the vertex without intravenous contrast.

[Series 3: head wo · axial · 0.44mm/px · z∈[-120,+6]mm · 7 of 35 slices shown, 9 images]
[im 5/35  brain]
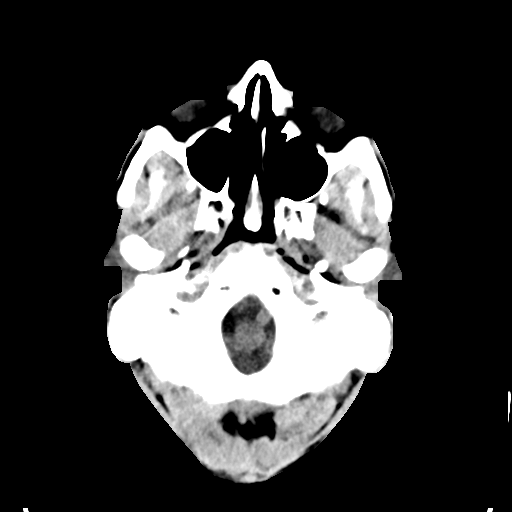
[im 5/35  bone]
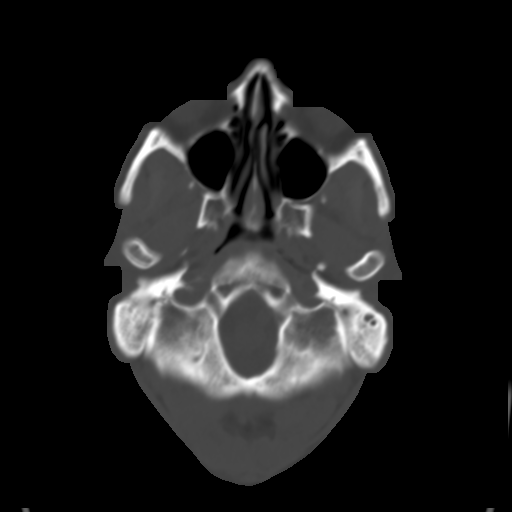
[im 9/35  brain]
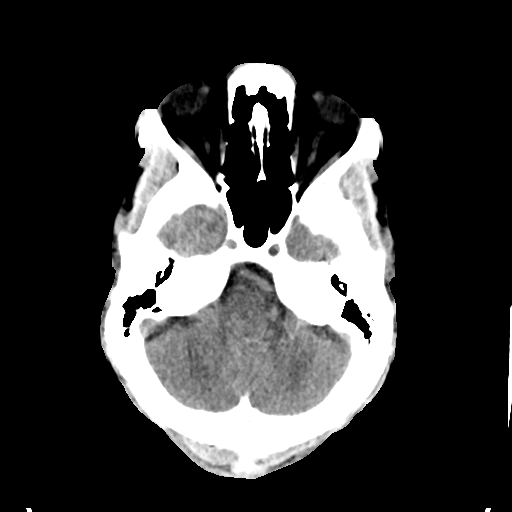
[im 13/35  brain]
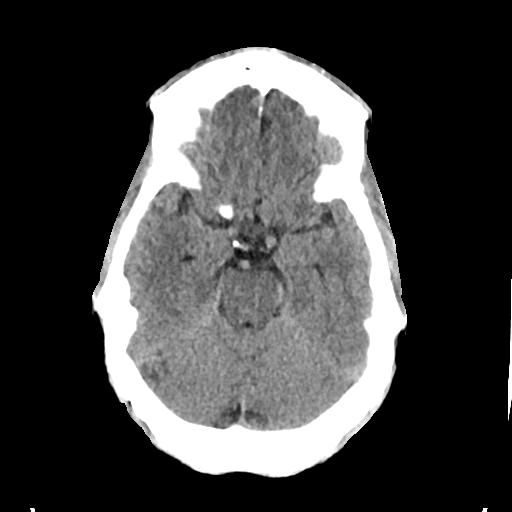
[im 18/35  brain]
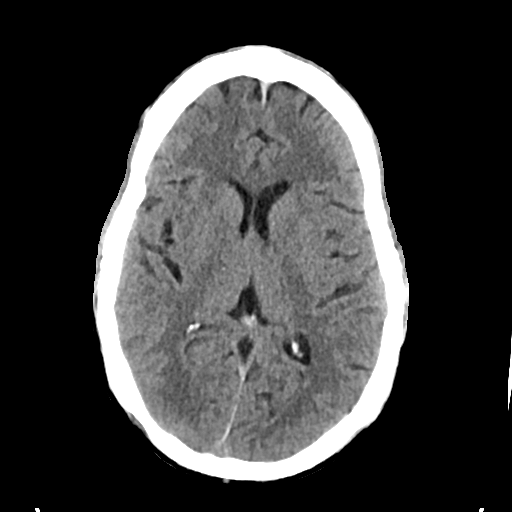
[im 22/35  brain]
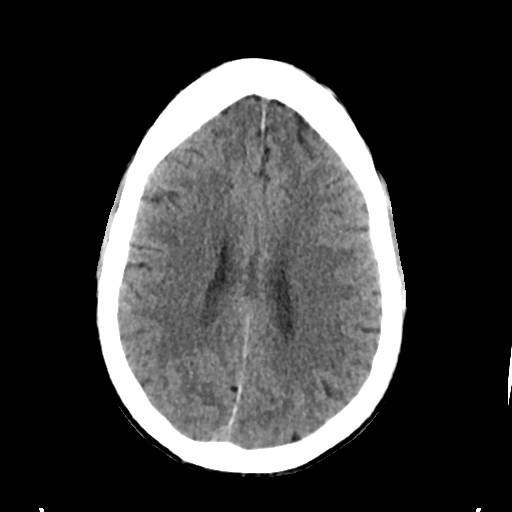
[im 22/35  bone]
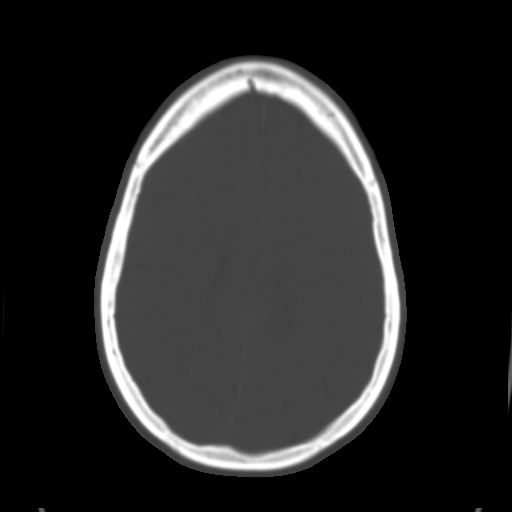
[im 26/35  brain]
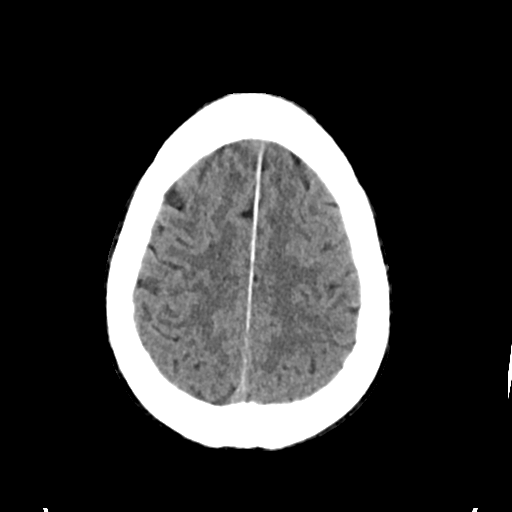
[im 30/35  brain]
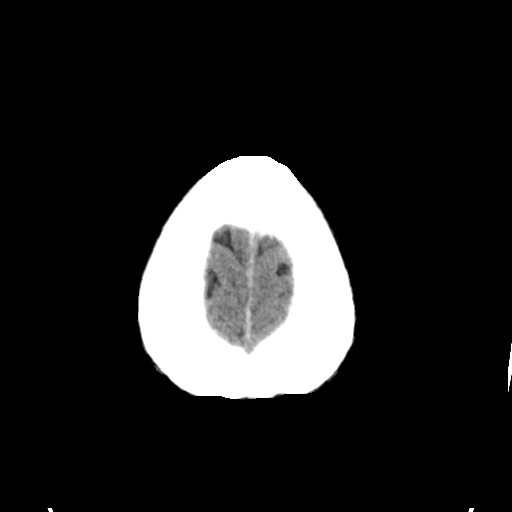

[Series 4: head bone · axial · 0.44mm/px · z∈[-124,-90]mm · 3 of 86 slices shown]
[im 9/86  bone]
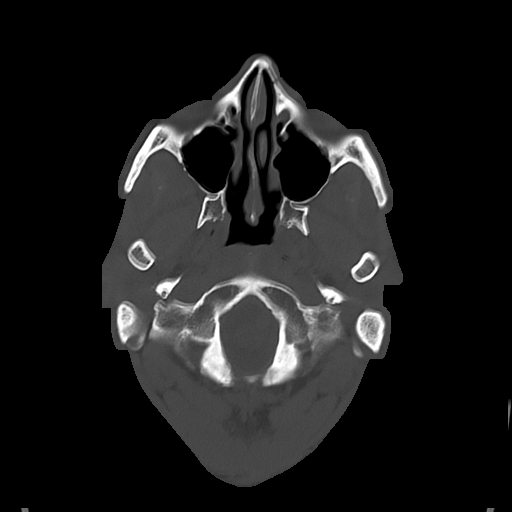
[im 18/86  bone]
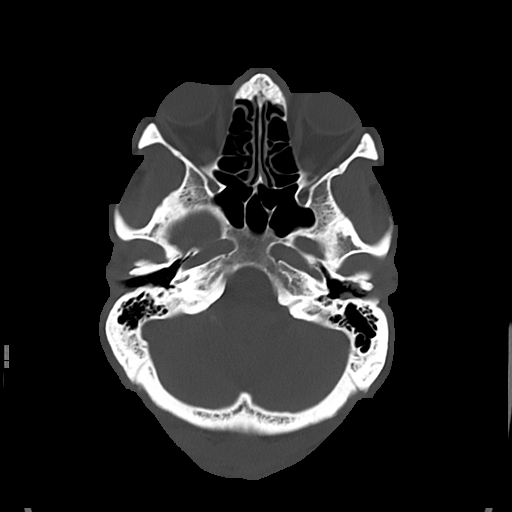
[im 26/86  bone]
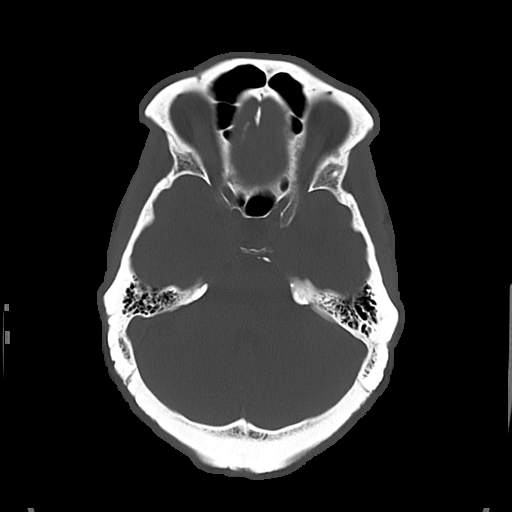

[Series 5: cor soft · coronal · 0.37mm/px · 3 of 81 slices shown]
[im 27/81  brain]
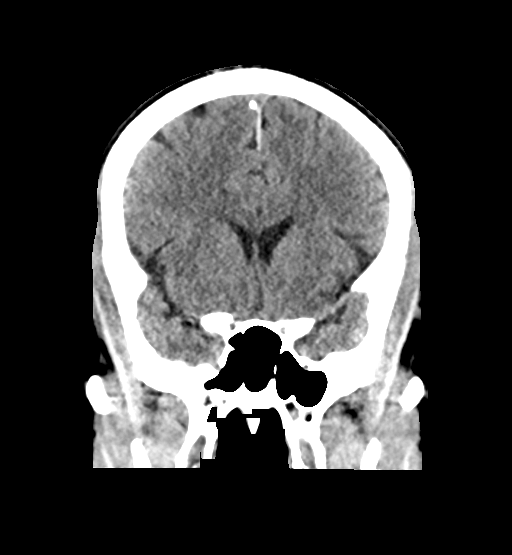
[im 36/81  brain]
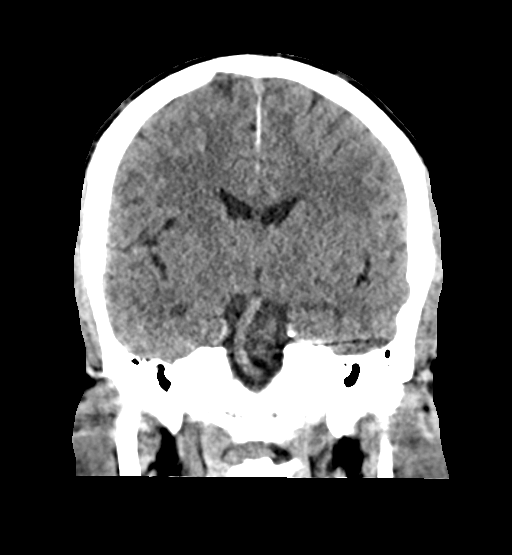
[im 45/81  brain]
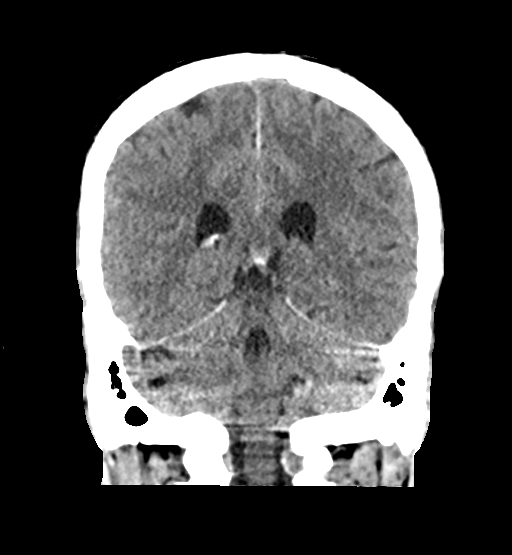

[Series 6: sag soft · sagittal · 0.38mm/px · 3 of 61 slices shown]
[im 21/61  brain]
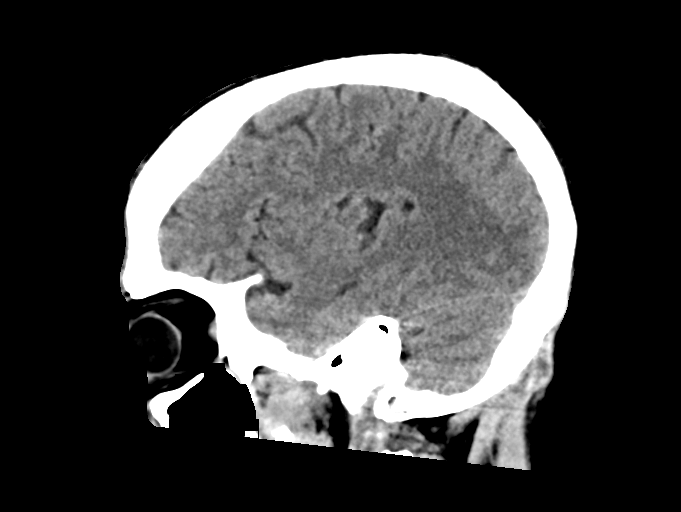
[im 31/61  brain]
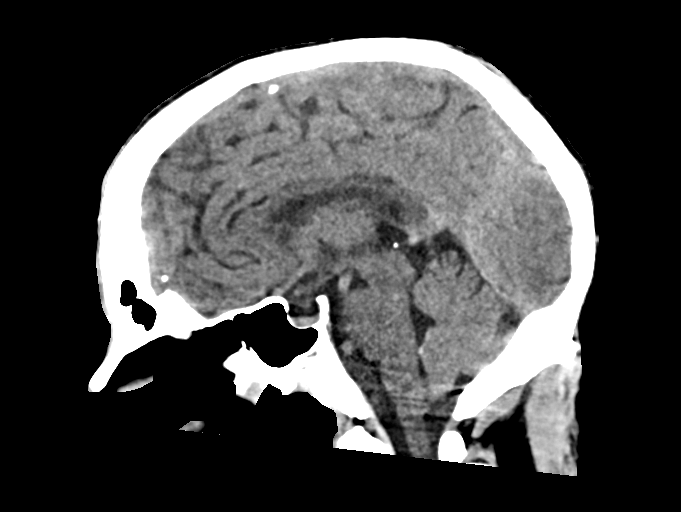
[im 41/61  brain]
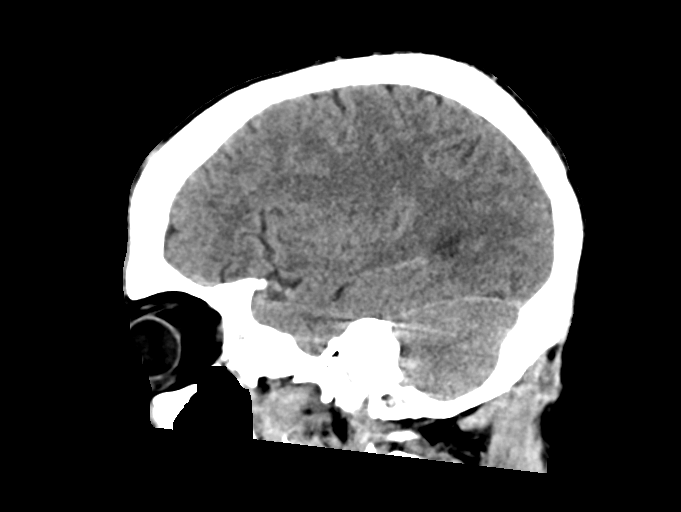

[16 of 47 positions shown; findings below may reference images not displayed]

FINDINGS: Brain: No evidence of acute infarction, hemorrhage, hydrocephalus,
extra-axial collection or mass lesion/mass effect.

Vascular: No hyperdense vessel or unexpected calcification.

Skull: Normal. Negative for fracture or focal lesion.

Sinuses/Orbits: No acute finding.

Other: None.
IMPRESSION: No acute intracranial abnormality.

## 2019-02-17 IMAGING — DX DG CHEST 1V PORT
1 series · 1 of 1 positions shown · non-contrast
Comparison: [DATE]

CLINICAL DATA: Change in mental status

EXAM:
PORTABLE CHEST 1 VIEW

[chest ap]
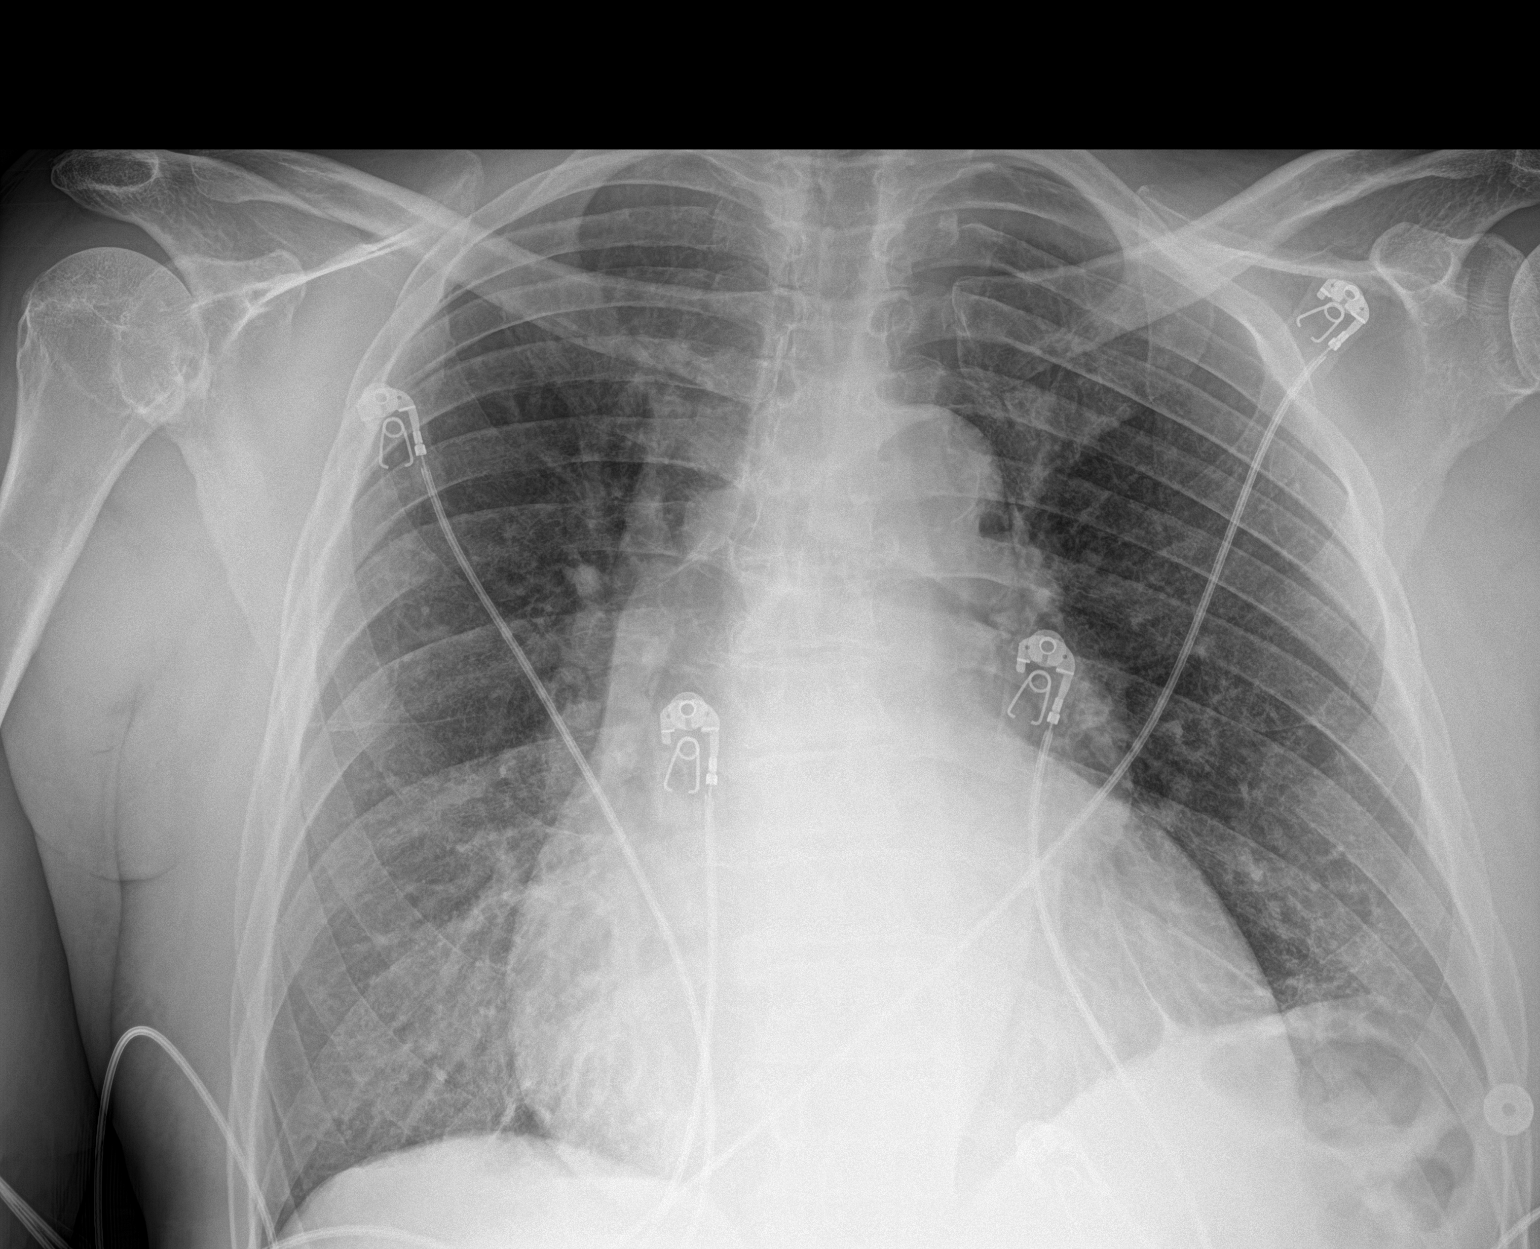

[1 of 1 positions shown; findings below may reference images not displayed]

FINDINGS: There is unchanged cardiomegaly. Mild prominence of the central
pulmonary vasculature is seen. No large airspace consolidation or
pleural effusion. Aortic knob calcifications. The visualized
skeletal structures are unremarkable.
IMPRESSION: Cardiomegaly and pulmonary vascular congestion.

## 2019-02-17 MED ORDER — AMLODIPINE BESYLATE 5 MG PO TABS
10.0000 mg | ORAL_TABLET | Freq: Once | ORAL | Status: AC
  Administered 2019-02-17: 10 mg via ORAL
  Filled 2019-02-17: qty 2

## 2019-02-17 MED ORDER — CARVEDILOL 12.5 MG PO TABS
25.0000 mg | ORAL_TABLET | Freq: Once | ORAL | Status: AC
  Administered 2019-02-17: 25 mg via ORAL
  Filled 2019-02-17: qty 2

## 2019-02-17 MED ORDER — HYDRALAZINE HCL 20 MG/ML IJ SOLN
10.0000 mg | Freq: Once | INTRAMUSCULAR | Status: AC
  Administered 2019-02-17: 10 mg via INTRAVENOUS
  Filled 2019-02-17: qty 1

## 2019-02-17 MED ORDER — NITROGLYCERIN IN D5W 200-5 MCG/ML-% IV SOLN
0.0000 ug/min | INTRAVENOUS | Status: DC
  Administered 2019-02-17: 5 ug/min via INTRAVENOUS
  Administered 2019-02-19: 20 ug/min via INTRAVENOUS
  Filled 2019-02-17 (×2): qty 250

## 2019-02-17 MED ORDER — CLONIDINE HCL 0.2 MG PO TABS
0.2000 mg | ORAL_TABLET | Freq: Once | ORAL | Status: AC
  Administered 2019-02-17: 0.2 mg via ORAL
  Filled 2019-02-17: qty 1

## 2019-02-17 NOTE — H&P (Addendum)
Jason [name]. VPX:[id number] DOB: 16-Jun-1960 DOA: 02/17/2019     PCP: [name], FNP   Outpatient Specialists:  CARDS:  Dr. [name] NEphrology:    Dr. Hollie Salk   Patient arrived to ER on 02/17/19 at 1817  Patient coming from: home Lives alone     Chief Complaint:   Chief Complaint  Patient presents with  . Altered Mental Status    HPI: Jason [name] is a 59 y.o. male with medical history significant of CHF, end-stage renal disease on hemodialysis Tuesday Thursday Saturday, HTN    Presented with   altered mental status from his dialysis center today she received normal dialysis treatment today 4.3 L was taken off patient fell asleep during dialysis like he usually does when he woke up he was only oriented to self and place which is not his baseline he is usually fully oriented x4.   Pt reports a headache that has been on and off. EMS was called on arrival noted to have a significantly elevated blood pressure 260/120 no seizure activity he has not taken his blood pressure medications this morning as he was going for dialysis. Patient usually on Norvasc and Coreg  He reports his BP has been staying up lately Has been in running in 180  Denies any COVID exposure Denies SOB, CP, COUGH fever or chills but endorses runny nose Denies N/VD   Reports his left leg has been swelling more lately especially when he is driving  Infectious risk factors:  Reports confusion     In  ER  COVID TEST     PCR testing  Pending  No results found for: SARSCOV2NAA   Regarding pertinent Chronic problems:       HTN on Norvasc and Coreg   chronic CHF diastolic  - last echo 4627 moderate concentric hypertrophy.     End-stage renal disease on hemodialysis Tuesday Thursday Saturday through right arm fistula    While in ER:  His home medications were restarted and he was given a dose of hydralazine IV Now back to baseline Noted to be hypoxic down to mid 80% Started on  Nitro drip  ER Provider Called: nephrology    Dr.Bhandari  They Recommend admit to medicine  start on nitro drip Will see in AM    The following Work up has been ordered so far:  Orders Placed This Encounter  Procedures  . Respiratory Panel by RT PCR (Flu A&B, Covid) - Nasopharyngeal Swab  . DG Chest Port 1 View  . CT Head Wo Contrast  . CBC with Differential/Platelet  . Comprehensive metabolic panel  . Consult to nephrology  ALL PATIENTS BEING ADMITTED/HAVING PROCEDURES NEED COVID-19 SCREENING  . Consult for [place] Admission  ALL PATIENTS BEING ADMITTED/HAVING PROCEDURES NEED COVID-19 SCREENING  . EKG 12-Lead  . EKG 12-Lead    Following Medications were ordered in ER: Medications  nitroGLYCERIN 50 mg in dextrose 5 % 250 mL (0.2 mg/mL) infusion (5 mcg/min Intravenous New Bag/Given 02/17/19 2232)  cloNIDine (CATAPRES) tablet 0.2 mg (0.2 mg Oral Given 02/17/19 1846)  amLODipine (NORVASC) tablet 10 mg (10 mg Oral Given 02/17/19 1957)  carvedilol (COREG) tablet 25 mg (25 mg Oral Given 02/17/19 1957)  hydrALAZINE (APRESOLINE) injection 10 mg (10 mg Intravenous Given 02/17/19 1958)     Significant initial  Findings: Abnormal Labs Reviewed  CBC WITH DIFFERENTIAL/PLATELET - Abnormal; Notable for the following components:      Result Value   RBC  3.34 (*)    Hemoglobin 9.8 (*)    HCT 29.9 (*)    Platelets 145 (*)    All other components within normal limits  COMPREHENSIVE METABOLIC PANEL - Abnormal; Notable for the following components:   BUN 50 (*)    Creatinine, Ser 8.41 (*)    Alkaline Phosphatase 34 (*)    Total Bilirubin 1.4 (*)    GFR calc non Af Amer 6 (*)    GFR calc Af Amer 7 (*)    All other components within normal limits  TROPONIN I (HIGH SENSITIVITY) - Abnormal; Notable for the following components:   Troponin I (High Sensitivity) 77 (*)    All other components within normal limits  TROPONIN I (HIGH SENSITIVITY) - Abnormal; Notable for the following components:     Troponin I (High Sensitivity) 67 (*)    All other components within normal limits    Otherwise labs showing:    Recent Labs  Lab 02/17/19 1833  NA 139  K 3.8  CO2 26  GLUCOSE 91  BUN 50*  CREATININE 8.41*  CALCIUM 10.1    Cr    Lab Results  Component Value Date   CREATININE 8.41 (H) 02/17/2019   CREATININE 7.16 (H) 08/23/2018   CREATININE 13.01 (H) 05/26/2018    Recent Labs  Lab 02/17/19 1833  AST 18  ALT 31  ALKPHOS 34*  BILITOT 1.4*  PROT 6.7  ALBUMIN 3.6   Lab Results  Component Value Date   CALCIUM 10.1 02/17/2019   PHOS 3.6 01/22/2017     WBC      Component Value Date/Time   WBC 4.2 02/17/2019 1833   ANC    Component Value Date/Time   NEUTROABS 2.6 02/17/2019 1833   NEUTROABS 1.9 08/23/2018 0840   ALC No components found for: LYMPHAB    Plt: Lab Results  Component Value Date   PLT 145 (L) 02/17/2019     COVID-19 Labs  No results for input(s): DDIMER, FERRITIN, LDH, CRP in the last 72 hours.  No results found for: SARSCOV2NAA    HG/HCT  Stable,     Component Value Date/Time   HGB 9.8 (L) 02/17/2019 1833   HGB 10.3 (L) 08/23/2018 0840   HCT 29.9 (L) 02/17/2019 1833   HCT 30.2 (L) 08/23/2018 0840    Troponin 67     ECG: Ordered Personally reviewed by me showing: HR : 84 Rhythm:  NSR,   no evidence of ischemic changes QTC 428      UA not ordered        Ordered  CT HEAD  NON acute  CXR -cardiomegaly with pulmonary vascular congestion     ED Triage Vitals  Enc Vitals Group     BP 02/17/19 1838 (!) 244/127     Pulse Rate 02/17/19 1838 82     Resp 02/17/19 1838 16     Temp 02/17/19 1840 98.9 F (37.2 C)     Temp Source 02/17/19 1840 Oral     SpO2 02/17/19 1835 97 %     Weight 02/17/19 1840 175 lb (79.4 kg)     Height 02/17/19 1840 6\' 2"  (1.88 m)     Head Circumference --      Peak Flow --      Pain Score 02/17/19 1839 0     Pain Loc --      Pain Edu? --      Excl. in [place]? --   FOYD(74)@  Latest   Blood pressure (!) 222/118, pulse 69, temperature 98.9 F (37.2 C), temperature source Oral, resp. rate (!) 23, height 6\' 2"  (1.88 m), weight 79.4 kg, SpO2 97 %.    Hospitalist was called for admission for hypertensive urgency   Review of Systems:    Pertinent positives include: Confusion  Constitutional:  No weight loss, night sweats, Fevers, chills, fatigue, weight loss  HEENT:  No headaches, Difficulty swallowing,Tooth/dental problems,Sore throat,  No sneezing, itching, ear ache, nasal congestion, post nasal drip,  Cardio-vascular:  No chest pain, Orthopnea, PND, anasarca, dizziness, palpitations.no Bilateral lower extremity swelling  GI:  No heartburn, indigestion, abdominal pain, nausea, vomiting, diarrhea, change in bowel habits, loss of appetite, melena, blood in stool, hematemesis Resp:  no shortness of breath at rest. No dyspnea on exertion, No excess mucus, no productive cough, No non-productive cough, No coughing up of blood.No change in color of mucus.No wheezing. Skin:  no rash or lesions. No jaundice GU:  no dysuria, change in color of urine, no urgency or frequency. No straining to urinate.  No flank pain.  Musculoskeletal:  No joint pain or no joint swelling. No decreased range of motion. No back pain.  Psych:  No change in mood or affect. No depression or anxiety. No memory loss.  Neuro: no localizing neurological complaints, no tingling, no weakness, no double vision, no gait abnormality, no slurred speech,   All systems reviewed and apart from [place] all are negative  Past Medical History:   Past Medical History:  Diagnosis Date  . A-V fistula (HCC)    Right arm  . Abdominal hernia   . AKI (acute kidney injury) ([place]) 01/2015  . Anasarca   . Anemia   . Asthma    as a child  . Bilateral inguinal hernia   . Bilateral pleural effusion 01/2017  . Cellulitis 01/16/2017   Bilateral lower extremity  . CHF (congestive heart failure) ([place])   . Chronic venous  insufficiency   . Concentric left ventricular hypertrophy 08/17/2017   Moderated, noted on ECHO  . Diastolic dysfunction 37/34/2876   noted on ECHO  . Elevated LFTs    per patient, resolved  . End stage renal disease [facility])    Dialysis T/Th/Sa  Started 02/2017/pt is waiting for a kidney transplant  . H/O right inguinal hernia repair 11/2017  . History of colonoscopy 03/20/2018  . History of elevated PSA 10/2017  . Hypertension   . Pneumonia   . Pulmonary hypertension (HCC)    Moderate  . Venous stasis ulcer ([place])    bil legs/ healed up now per pt (03/06/18)     Past Surgical History:  Procedure Laterality Date  . AV FISTULA PLACEMENT Right 01/20/2017   Procedure: ARTERIOVENOUS (AV) FISTULA CREATION;  Surgeon: Angelia Mould, MD;  Location: [facility];  Service: Vascular;  Laterality: Right;  . HERNIA REPAIR    . INSERTION OF DIALYSIS CATHETER Right 01/20/2017   Procedure: INSERTION OF DIALYSIS CATHETER;  Surgeon: Angelia Mould, MD;  Location: [facility];  Service: Vascular;  Laterality: Right;  . INSERTION OF MESH N/A 11/18/2017   Procedure: INSERTION OF MESH;  Surgeon: Michael Boston, MD;  Location: [facility];  Service: General;  Laterality: N/A;  . IR FLUORO GUIDE CV LINE RIGHT  01/18/2017  . IR US GUIDE VASC ACCESS RIGHT  01/18/2017  . LAPAROSCOPIC INGUINAL HERNIA WITH UMBILICAL HERNIA Bilateral 11/18/2017   Procedure: LAPAROSCOPIC BILATERAL INGUINAL HERNIA REPAIR WITH UMBILICAL HERNIA REPAIR WITH MESH;  Surgeon: Michael Boston, MD;  Location: [facility];  Service: General;  Laterality: Bilateral;  . TOE SURGERY     right fracture in big toe  . TONSILLECTOMY      Social History:  Ambulatory independently      reports that he quit smoking about 24 years ago. He quit after 17.00 years of use. He has never used smokeless tobacco. He reports previous alcohol use. He reports previous drug use.   Family History:   Family History  Problem Relation Age of Onset  . Heart attack Mother 41        CABG hx  . Heart disease Mother   . Hypertension Mother   . Stroke Mother   . Diabetes Father   . Healthy Brother   . Prostate cancer Maternal Grandfather   . Colon cancer Neg Hx   . Colon polyps Neg Hx   . Esophageal cancer Neg Hx   . Stomach cancer Neg Hx   . Rectal cancer Neg Hx     Allergies: Allergies  Allergen Reactions  . Lisinopril Cough     Prior to Admission medications   Medication Sig Start Date End Date Taking? Authorizing Provider  amLODipine (NORVASC) 10 MG tablet Take 10 mg by mouth every evening. 06/20/18   [provider]  carvedilol (COREG) 25 MG tablet Take 1 tablet (25 mg total) by mouth 2 (two) times daily with a meal. 02/02/18   [name], FNP  lidocaine-prilocaine (EMLA) cream Apply 1 application topically See admin instructions. Apply 1-2 hours before dialysis. Cover with occlusive dressing 3 days weekly (Tuesday, Thursday, & Saturdays) 06/07/17   [provider]  multivitamin (RENA-VIT) TABS tablet Take 1 tablet by mouth daily. 10/20/17   [provider]  NON FORMULARY Vit d IV-prior to dialysis 3 times a week ( per pt)    [provider]  OVER THE COUNTER MEDICATION Renadyl - Take 2 capsules by mouth daily.    [provider]   Physical Exam: Blood pressure (!) 222/118, pulse 69, temperature 98.9 F (37.2 C), temperature source Oral, resp. rate (!) 23, height 6\' 2"  (1.88 m), weight 79.4 kg, SpO2 97 %. 1. General:  in No  Acute distress   Chronically ill -appearing 2. Psychological: Alert and  Oriented 3. Head/ENT:    Dry Mucous Membranes                          Head Non traumatic, neck supple                           Poor Dentition 4. SKIN:   decreased Skin turgor,  Skin clean Dry and intact no rash 5. Heart: Regular rate and rhythm no Murmur, no Rub or gallop 6. Lungs: no wheezes or crackles   7. Abdomen: Soft, non-tender, Non distended  bowel sounds present 8. Lower extremities: no  clubbing, cyanosis, no  edema 9. Neurologically  strength 5 out of 5 in all 4 extremities cranial nerves II through XII intact 10. MSK: Normal range of motion   All other LABS:     Recent Labs  Lab 02/17/19 1833  WBC 4.2  NEUTROABS 2.6  HGB 9.8*  HCT 29.9*  MCV 89.5  PLT 145*     Recent Labs  Lab 02/17/19 1833  NA 139  K 3.8  CL 100  CO2 26  GLUCOSE 91  BUN 50*  CREATININE 8.41*  CALCIUM 10.1     Recent Labs  Lab 02/17/19 1833  AST 18  ALT 31  ALKPHOS 34*  BILITOT 1.4*  PROT 6.7  ALBUMIN 3.6    Cultures:    Component Value Date/Time   SDES  03/04/2017 1041    LEG LEFT ANT DISTAL LOWER Performed at [facility], [place] [address], [place], [place] [address]    SPECREQUEST  03/04/2017 1041    NONE Performed at [facility], [place] [address] [place], [place] [address]    CULT  03/04/2017 1041    ABUNDANT MORGANELLA MORGANII WITHIN MIXED ORGANISMS NO STAPHYLOCOCCUS AUREUS ISOLATED NO GROUP A STREP (S.PYOGENES) ISOLATED Performed at [facility], [place] [address] [place], [place] [address]    REPTSTATUS 03/07/2017 FINAL 03/04/2017 1041     Radiological Exams on Admission: CT Head Wo Contrast  Result Date: 02/17/2019 CLINICAL DATA:  Subarachnoid hemorrhage suspected. Altered mental status status post dialysis. EXAM: CT HEAD WITHOUT CONTRAST TECHNIQUE: Contiguous axial images were obtained from the base of the skull through the vertex without intravenous contrast. COMPARISON:  CT head dated January 16, 2017. FINDINGS: Brain: No evidence of acute infarction, hemorrhage, hydrocephalus, extra-axial collection or mass lesion/mass effect. Vascular: No hyperdense vessel or unexpected calcification. Skull: Normal. Negative for fracture or focal lesion. Sinuses/Orbits: No acute finding. Other: None. IMPRESSION: No acute intracranial abnormality. Electronically Signed   By: Constance Holster M.D.   On: 02/17/2019 19:27   DG  Chest Port 1 View  Result Date: 02/17/2019 CLINICAL DATA:  Change in mental status EXAM: PORTABLE CHEST 1 VIEW COMPARISON:  January 20, 2017 FINDINGS: There is unchanged cardiomegaly. Mild prominence of the central pulmonary vasculature is seen. No large airspace consolidation or pleural effusion. Aortic knob calcifications. The visualized skeletal structures are unremarkable. IMPRESSION: Cardiomegaly and pulmonary vascular congestion. Electronically Signed   By: Prudencio Pair M.D.   On: 02/17/2019 19:38    Chart has been reviewed   Assessment/Plan   59 y.o. male with medical history significant of CHF, end-stage renal disease on hemodialysis Tuesday Thursday Saturday, HTN Admitted for hypertensive emergency  Present on Admission: . Hypertensive emergency -admit to stepdown on nitroglycerin drip, avoid over aggressive drop in blood pressure.  Given neurological complaints obtain MRI to evaluate for PRESS Still appears to be somewhat fluid up nephrology has been consulted may need repeat dialysis in a.m.  Acute encephalopathy transient in the setting of severe hypotension most likely secondary to hypertensive emergency Obtain MRI to evaluate for PRESS  . Anemia -chronic stable likely secondary to CKD, will obtain anemia panel in Am   Left leg edema - obtain dopplers  . Essential hypertension -poorly controlled will likely need adjustments of home medications  . Elevated troponin -in the setting of hypertensive emergency.  No chest pain no EKG changes to suggest ischemia.  Continue to follow-up with an echogram,   Acute on chronic  diastolic CHF  Treated with hemodialysis at this time.  Appreciate nephrology input repeat echo  . ESRD (end stage renal disease) [facility]) on hemodialysis Tuesday Thursday Saturday nephrology is aware and appreciate their input regarding treatment    Other plan as per orders.  DVT prophylaxis:  SCD    Code Status:  FULL CODE  as per patient   I had  personally discussed CODE STATUS with patient   Family Communication:   Family not at  Bedside    Disposition Plan:     To  home once workup is complete and patient is stable                                       Consults called:   Nephrology   Admission status:  ED Disposition    ED Disposition Condition [place]: [place] [[id number]]  Level of Care: Progressive [102]  Admit to Progressive based on following criteria: CARDIOVASCULAR & THORACIC of moderate stability with acute coronary syndrome symptoms/low risk myocardial infarction/hypertensive urgency/arrhythmias/heart failure potentially compromising stability and stable post cardiovascular intervention patients.  Covid Evaluation: Symptomatic Person Under Investigation (PUI)  Diagnosis: Hypertensive urgency [[id number]]  Admitting Physician: Toy Baker [[id number]]  Attending Physician: Toy Baker [[id number]]       Obs      Level of care         SDU tele indefinitely please discontinue once patient no longer qualifies   Precautions: admitted as COVID negative No active isolations    PPE: Used by the provider:   P100  eye Goggles,  Gloves  gown   Karsynn Deweese 02/17/2019, 11:52 PM    [facility]     after 2 AM please page floor coverage PA If 7AM-7PM, please contact the day team taking care of the patient using [email or web address]   Patient was evaluated in the context of the global COVID-19 pandemic, which necessitated consideration that the patient might be at risk for infection with the SARS-CoV-2 virus that causes COVID-19. Institutional protocols and algorithms that pertain to the evaluation of patients at risk for COVID-19 are in a state of rapid change based on information released by regulatory bodies including the CDC and federal and state organizations. These policies and algorithms were followed during the patient's care.

## 2019-02-17 NOTE — ED Triage Notes (Signed)
Pt arrived via [facility] from dialysis for AMS. Pt normally A&Ox4. Pt received full dialysis tx 4.25 hrs, 4.3L were taken off. Pt fell asleep during dialysis like normal then woke up A&Ox2 to self and placed. Pt is A&Ox2 to self and place. Pt is NSR on monitor, BP is elevated.

## 2019-02-17 NOTE — ED Notes (Signed)
Pt transported to CT ?

## 2019-02-17 NOTE — ED Provider Notes (Signed)
[facility] EMERGENCY DEPARTMENT Provider Note   CSN: [id number] Arrival date & time: 02/17/19  1817     History Chief Complaint  Patient presents with  . Altered Mental Status    Jason [name]. is a 59 y.o. male history of CHF, ESRD on dialysis (last HD today), here presenting with altered mental status.  Patient was at dialysis and took a nap and was noted to be altered.  He was noted to be very confused at dialysis.  He did finish dialysis and they sent him over here to the ER for evaluation.  Patient denies any chest pain or shortness of breath and did not know what happened.  There was no seizure activity noted.  EMS did notice that his blood pressure was very elevated at 260/120.  Patient states that he did take his blood pressure medicines this morning.  The history is provided by the patient.  Level V caveat- AMS      Past Medical History:  Diagnosis Date  . A-V fistula (HCC)    Right arm  . Abdominal hernia   . AKI (acute kidney injury) ([place]) 01/2015  . Anasarca   . Anemia   . Asthma    as a child  . Bilateral inguinal hernia   . Bilateral pleural effusion 01/2017  . Cellulitis 01/16/2017   Bilateral lower extremity  . CHF (congestive heart failure) ([place])   . Chronic venous insufficiency   . Concentric left ventricular hypertrophy 08/17/2017   Moderated, noted on ECHO  . Diastolic dysfunction 01/74/9449   noted on ECHO  . Elevated LFTs    per patient, resolved  . End stage renal disease [facility])    Dialysis T/Th/Sa  Started 02/2017/pt is waiting for a kidney transplant  . H/O right inguinal hernia repair 11/2017  . History of colonoscopy 03/20/2018  . History of elevated PSA 10/2017  . Hypertension   . Pneumonia   . Pulmonary hypertension (HCC)    Moderate  . Venous stasis ulcer ([place])    bil legs/ healed up now per pt (03/06/18)    Patient Active Problem List   Diagnosis Date Noted  . Bilateral impacted cerumen 08/24/2018  . History  of elevated PSA 08/24/2018  . Hemodialysis patient ([place]) 05/28/2018  . Vitamin D deficiency 05/28/2018  . Left inguinal hernia s/p lap repair w mesh 11/18/2017 11/18/2017  . Scrotal hernia, right, s/p lap repair w mesh 11/18/2017 05/30/2017  . Arteriovenous fistula (Right arm) for dialysis 05/30/2017  . Umbilical hernia s/p primary repair 11/18/2017 05/30/2017  . Anasarca 02/18/2017  . ESRD (end stage renal disease) ([place])   . NSVT (nonsustained ventricular tachycardia) ([place])   . Evaluation by psychiatric service required 01/19/2017  . Elevated troponin 01/16/2017  . Acute CHF (congestive heart failure) ([place]) 01/16/2017  . Venous stasis ulcer ([place]) 01/16/2017  . Essential hypertension 02/26/2015  . Abnormal EKG 01/30/2015  . Anemia 01/29/2015    Past Surgical History:  Procedure Laterality Date  . AV FISTULA PLACEMENT Right 01/20/2017   Procedure: ARTERIOVENOUS (AV) FISTULA CREATION;  Surgeon: Angelia Mould, MD;  Location: [facility];  Service: Vascular;  Laterality: Right;  . HERNIA REPAIR    . INSERTION OF DIALYSIS CATHETER Right 01/20/2017   Procedure: INSERTION OF DIALYSIS CATHETER;  Surgeon: Angelia Mould, MD;  Location: [facility];  Service: Vascular;  Laterality: Right;  . INSERTION OF MESH N/A 11/18/2017   Procedure: INSERTION OF MESH;  Surgeon: Michael Boston, MD;  Location: [facility];  Service: General;  Laterality: N/A;  . IR FLUORO GUIDE CV LINE RIGHT  01/18/2017  . IR US GUIDE VASC ACCESS RIGHT  01/18/2017  . LAPAROSCOPIC INGUINAL HERNIA WITH UMBILICAL HERNIA Bilateral 11/18/2017   Procedure: LAPAROSCOPIC BILATERAL INGUINAL HERNIA REPAIR WITH UMBILICAL HERNIA REPAIR WITH MESH;  Surgeon: Michael Boston, MD;  Location: [facility];  Service: General;  Laterality: Bilateral;  . TOE SURGERY     right fracture in big toe  . TONSILLECTOMY         Family History  Problem Relation Age of Onset  . Heart attack Mother 32       CABG hx  . Heart disease Mother   . Hypertension Mother     . Stroke Mother   . Diabetes Father   . Healthy Brother   . Prostate cancer Maternal Grandfather   . Colon cancer Neg Hx   . Colon polyps Neg Hx   . Esophageal cancer Neg Hx   . Stomach cancer Neg Hx   . Rectal cancer Neg Hx     Social History   Tobacco Use  . Smoking status: Former Smoker    Years: 17.00    Quit date: 1997    Years since quitting: 24.1  . Smokeless tobacco: Never Used  . Tobacco comment: quit smoking in 1997  Substance Use Topics  . Alcohol use: Not Currently    Comment: every several months, rare  . Drug use: Not Currently    Comment: marijuana    Home Medications Prior to Admission medications   Medication Sig Start Date End Date Taking? Authorizing Provider  amLODipine (NORVASC) 10 MG tablet Take 10 mg by mouth every evening. 06/20/18   [provider]  carvedilol (COREG) 25 MG tablet Take 1 tablet (25 mg total) by mouth 2 (two) times daily with a meal. 02/02/18   [name], FNP  lidocaine-prilocaine (EMLA) cream Apply 1 application topically See admin instructions. Apply 1-2 hours before dialysis. Cover with occlusive dressing 3 days weekly (Tuesday, Thursday, & Saturdays) 06/07/17   [provider]  multivitamin (RENA-VIT) TABS tablet Take 1 tablet by mouth daily. 10/20/17   [provider]  NON FORMULARY Vit d IV-prior to dialysis 3 times a week ( per pt)    [provider]  OVER THE COUNTER MEDICATION Renadyl - Take 2 capsules by mouth daily.    [provider]    Allergies    Lisinopril  Review of Systems   Review of Systems  Unable to perform ROS: Mental status change  All other systems reviewed and are negative.   Physical Exam Updated Vital Signs BP (!) 230/121   Pulse 69   Temp 98.9 F (37.2 C) (Oral)   Resp (!) 22   Ht 6\' 2"  (1.88 m)   Wt 79.4 kg   SpO2 95%   BMI 22.47 kg/m   Physical Exam Vitals and nursing note reviewed.  Constitutional:      Comments: Chronically ill    HENT:     Head: Normocephalic.     Nose: Nose normal.     Mouth/Throat:     Mouth: Mucous membranes are moist.  Eyes:     Extraocular Movements: Extraocular movements intact.     Pupils: Pupils are equal, round, and reactive to light.  Cardiovascular:     Rate and Rhythm: Normal rate and regular rhythm.     Pulses: Normal pulses.     Heart sounds: Normal  heart sounds.  Pulmonary:     Effort: Pulmonary effort is normal.     Breath sounds: Normal breath sounds.  Abdominal:     General: Abdomen is flat.     Palpations: Abdomen is soft.  Musculoskeletal:        General: Normal range of motion.     Cervical back: Normal range of motion.  Skin:    General: Skin is warm.     Capillary Refill: Capillary refill takes less than 2 seconds.  Neurological:     Comments: CN 2- 12 intact, nl strength and sensation throughout, nl finger to nose bilaterally   Psychiatric:        Mood and Affect: Mood normal.     ED Results / Procedures / Treatments   Labs (all labs ordered are listed, but only abnormal results are displayed) Labs Reviewed  CBC WITH DIFFERENTIAL/PLATELET - Abnormal; Notable for the following components:      Result Value   RBC 3.34 (*)    Hemoglobin 9.8 (*)    HCT 29.9 (*)    Platelets 145 (*)    All other components within normal limits  COMPREHENSIVE METABOLIC PANEL - Abnormal; Notable for the following components:   BUN 50 (*)    Creatinine, Ser 8.41 (*)    Alkaline Phosphatase 34 (*)    Total Bilirubin 1.4 (*)    GFR calc non Af Amer 6 (*)    GFR calc Af Amer 7 (*)    All other components within normal limits  TROPONIN I (HIGH SENSITIVITY) - Abnormal; Notable for the following components:   Troponin I (High Sensitivity) 77 (*)    All other components within normal limits  TROPONIN I (HIGH SENSITIVITY) - Abnormal; Notable for the following components:   Troponin I (High Sensitivity) 67 (*)    All other components within normal limits    EKG EKG  Interpretation  Date/Time:  Saturday February 17 2019 18:32:29 EST Ventricular Rate:  84 PR Interval:    QRS Duration: 100 QT Interval:  362 QTC Calculation: 428 R Axis:   6 Text Interpretation: Sinus rhythm Probable left atrial enlargement RSR' in V1 or V2, probably normal variant LVH with secondary repolarization abnormality No significant change since last tracing Confirmed by Wandra Arthurs ([id number]) on 02/17/2019 6:34:36 PM   Radiology CT Head Wo Contrast  Result Date: 02/17/2019 CLINICAL DATA:  Subarachnoid hemorrhage suspected. Altered mental status status post dialysis. EXAM: CT HEAD WITHOUT CONTRAST TECHNIQUE: Contiguous axial images were obtained from the base of the skull through the vertex without intravenous contrast. COMPARISON:  CT head dated January 16, 2017. FINDINGS: Brain: No evidence of acute infarction, hemorrhage, hydrocephalus, extra-axial collection or mass lesion/mass effect. Vascular: No hyperdense vessel or unexpected calcification. Skull: Normal. Negative for fracture or focal lesion. Sinuses/Orbits: No acute finding. Other: None. IMPRESSION: No acute intracranial abnormality. Electronically Signed   By: Constance Holster M.D.   On: 02/17/2019 19:27   DG Chest Port 1 View  Result Date: 02/17/2019 CLINICAL DATA:  Change in mental status EXAM: PORTABLE CHEST 1 VIEW COMPARISON:  January 20, 2017 FINDINGS: There is unchanged cardiomegaly. Mild prominence of the central pulmonary vasculature is seen. No large airspace consolidation or pleural effusion. Aortic knob calcifications. The visualized skeletal structures are unremarkable. IMPRESSION: Cardiomegaly and pulmonary vascular congestion. Electronically Signed   By: Prudencio Pair M.D.   On: 02/17/2019 19:38    Procedures Procedures (including critical care time)  CRITICAL CARE Performed by: Shanon Brow  Tamsen Meek   Total critical care time: 30 minutes  Critical care time was exclusive of separately billable procedures and treating  other patients.  Critical care was necessary to treat or prevent imminent or life-threatening deterioration.  Critical care was time spent personally by me on the following activities: development of treatment plan with patient and/or surrogate as well as nursing, discussions with consultants, evaluation of patient's response to treatment, examination of patient, obtaining history from patient or surrogate, ordering and performing treatments and interventions, ordering and review of laboratory studies, ordering and review of radiographic studies, pulse oximetry and re-evaluation of patient's condition.   Medications Ordered in ED Medications  cloNIDine (CATAPRES) tablet 0.2 mg (0.2 mg Oral Given 02/17/19 1846)  amLODipine (NORVASC) tablet 10 mg (10 mg Oral Given 02/17/19 1957)  carvedilol (COREG) tablet 25 mg (25 mg Oral Given 02/17/19 1957)  hydrALAZINE (APRESOLINE) injection 10 mg (10 mg Intravenous Given 02/17/19 1958)    ED Course  I have reviewed the triage vital signs and the nursing notes.  Pertinent labs & imaging results that were available during my care of the patient were reviewed by me and considered in my medical decision making (see chart for details).    MDM Rules/Calculators/A&P                      Keena Heesch. is a 59 y.o. male history dialysis, hypertension here presenting with altered mental status.  Patient apparently fell asleep and then was confused during dialysis .  Patient appears to be back to baseline.  His initial blood pressure was very elevated at 240/120. Concern for possible head bleed versus hypertensive encephalopathy. Will get labs and CT head and reassess.  We will give his blood pressure medicine as well.     10:22 PM BP still 224/112 despite IV hydralazine and PO BP meds. CT head unremarkable. His O2 dropped to 88% on RA and CXR showed pulmonary edema. I talked to Dr. Carolin Sicks from nephrology. He recommend IV nitro drip to control BP. He will see  patient in AM to decide about another dialysis session. Likely hypertensive encephalopathy.     Final Clinical Impression(s) / ED Diagnoses Final diagnoses:  None    Rx / DC Orders ED Discharge Orders    None       Drenda Freeze, MD 02/17/19 [phone]

## 2019-02-17 NOTE — ED Notes (Signed)
Placed pt on 2L 02 via [place]

## 2019-02-18 ENCOUNTER — Observation Stay (HOSPITAL_BASED_OUTPATIENT_CLINIC_OR_DEPARTMENT_OTHER): Payer: Medicare Other

## 2019-02-18 ENCOUNTER — Observation Stay (HOSPITAL_COMMUNITY): Payer: Medicare Other

## 2019-02-18 DIAGNOSIS — Z9119 Patient's noncompliance with other medical treatment and regimen: Secondary | ICD-10-CM | POA: Diagnosis not present

## 2019-02-18 DIAGNOSIS — I674 Hypertensive encephalopathy: Secondary | ICD-10-CM | POA: Diagnosis present

## 2019-02-18 DIAGNOSIS — Z992 Dependence on renal dialysis: Secondary | ICD-10-CM | POA: Diagnosis not present

## 2019-02-18 DIAGNOSIS — I5042 Chronic combined systolic (congestive) and diastolic (congestive) heart failure: Secondary | ICD-10-CM | POA: Diagnosis present

## 2019-02-18 DIAGNOSIS — I340 Nonrheumatic mitral (valve) insufficiency: Secondary | ICD-10-CM

## 2019-02-18 DIAGNOSIS — I361 Nonrheumatic tricuspid (valve) insufficiency: Secondary | ICD-10-CM | POA: Diagnosis not present

## 2019-02-18 DIAGNOSIS — I160 Hypertensive urgency: Secondary | ICD-10-CM | POA: Diagnosis not present

## 2019-02-18 DIAGNOSIS — I350 Nonrheumatic aortic (valve) stenosis: Secondary | ICD-10-CM | POA: Diagnosis not present

## 2019-02-18 DIAGNOSIS — R609 Edema, unspecified: Secondary | ICD-10-CM | POA: Diagnosis not present

## 2019-02-18 DIAGNOSIS — I161 Hypertensive emergency: Secondary | ICD-10-CM | POA: Diagnosis present

## 2019-02-18 DIAGNOSIS — N2581 Secondary hyperparathyroidism of renal origin: Secondary | ICD-10-CM | POA: Diagnosis present

## 2019-02-18 DIAGNOSIS — I272 Pulmonary hypertension, unspecified: Secondary | ICD-10-CM | POA: Diagnosis present

## 2019-02-18 DIAGNOSIS — Z20822 Contact with and (suspected) exposure to covid-19: Secondary | ICD-10-CM | POA: Diagnosis present

## 2019-02-18 DIAGNOSIS — Z833 Family history of diabetes mellitus: Secondary | ICD-10-CM | POA: Diagnosis not present

## 2019-02-18 DIAGNOSIS — D631 Anemia in chronic kidney disease: Secondary | ICD-10-CM | POA: Diagnosis present

## 2019-02-18 DIAGNOSIS — Z87891 Personal history of nicotine dependence: Secondary | ICD-10-CM | POA: Diagnosis not present

## 2019-02-18 DIAGNOSIS — I132 Hypertensive heart and chronic kidney disease with heart failure and with stage 5 chronic kidney disease, or end stage renal disease: Secondary | ICD-10-CM | POA: Diagnosis present

## 2019-02-18 DIAGNOSIS — E8770 Fluid overload, unspecified: Secondary | ICD-10-CM | POA: Diagnosis not present

## 2019-02-18 DIAGNOSIS — R0902 Hypoxemia: Secondary | ICD-10-CM | POA: Diagnosis present

## 2019-02-18 DIAGNOSIS — Z79899 Other long term (current) drug therapy: Secondary | ICD-10-CM | POA: Diagnosis not present

## 2019-02-18 DIAGNOSIS — N186 End stage renal disease: Secondary | ICD-10-CM | POA: Diagnosis present

## 2019-02-18 DIAGNOSIS — T463X5A Adverse effect of coronary vasodilators, initial encounter: Secondary | ICD-10-CM | POA: Diagnosis present

## 2019-02-18 DIAGNOSIS — G4440 Drug-induced headache, not elsewhere classified, not intractable: Secondary | ICD-10-CM | POA: Diagnosis present

## 2019-02-18 LAB — HEPATITIS B SURFACE ANTIGEN: Hepatitis B Surface Ag: NONREACTIVE

## 2019-02-18 LAB — CBC
HCT: 28.5 % — ABNORMAL LOW (ref 39.0–52.0)
HCT: 26.7 % — ABNORMAL LOW (ref 39.0–52.0)
Hemoglobin: 9.7 g/dL — ABNORMAL LOW (ref 13.0–17.0)
Hemoglobin: 9.2 g/dL — ABNORMAL LOW (ref 13.0–17.0)
MCH: 30 pg (ref 26.0–34.0)
MCH: 30.9 pg (ref 26.0–34.0)
MCHC: 34 g/dL (ref 30.0–36.0)
MCHC: 34.5 g/dL (ref 30.0–36.0)
MCV: 88.2 fL (ref 80.0–100.0)
MCV: 89.6 fL (ref 80.0–100.0)
Platelets: DECREASED 10*3/uL (ref 150–400)
Platelets: 145 10*3/uL — ABNORMAL LOW (ref 150–400)
RBC: 3.23 MIL/uL — ABNORMAL LOW (ref 4.22–5.81)
RBC: 2.98 MIL/uL — ABNORMAL LOW (ref 4.22–5.81)
RDW: 12.4 % (ref 11.5–15.5)
RDW: 12.2 % (ref 11.5–15.5)
WBC: 3.7 10*3/uL — ABNORMAL LOW (ref 4.0–10.5)
WBC: 3 10*3/uL — ABNORMAL LOW (ref 4.0–10.5)
nRBC: 0 % (ref 0.0–0.2)
nRBC: 0 % (ref 0.0–0.2)

## 2019-02-18 LAB — RENAL FUNCTION PANEL
Albumin: 3.2 g/dL — ABNORMAL LOW (ref 3.5–5.0)
Anion gap: 11 (ref 5–15)
BUN: 62 mg/dL — ABNORMAL HIGH (ref 6–20)
CO2: 25 mmol/L (ref 22–32)
Calcium: 8.8 mg/dL — ABNORMAL LOW (ref 8.9–10.3)
Chloride: 100 mmol/L (ref 98–111)
Creatinine, Ser: 10.42 mg/dL — ABNORMAL HIGH (ref 0.61–1.24)
GFR calc Af Amer: 6 mL/min — ABNORMAL LOW (ref >60)
GFR calc non Af Amer: 5 mL/min — ABNORMAL LOW (ref >60)
Glucose, Bld: 129 mg/dL — ABNORMAL HIGH (ref 70–99)
Phosphorus: 6.4 mg/dL — ABNORMAL HIGH (ref 2.5–4.6)
Potassium: 4.9 mmol/L (ref 3.5–5.1)
Sodium: 136 mmol/L (ref 135–145)

## 2019-02-18 LAB — COMPREHENSIVE METABOLIC PANEL
ALT: 30 U/L (ref 0–44)
AST: 19 U/L (ref 15–41)
Albumin: 3.4 g/dL — ABNORMAL LOW (ref 3.5–5.0)
Alkaline Phosphatase: 35 U/L — ABNORMAL LOW (ref 38–126)
Anion gap: 13 (ref 5–15)
BUN: 58 mg/dL — ABNORMAL HIGH (ref 6–20)
CO2: 26 mmol/L (ref 22–32)
Calcium: 9.2 mg/dL (ref 8.9–10.3)
Chloride: 99 mmol/L (ref 98–111)
Creatinine, Ser: 9.49 mg/dL — ABNORMAL HIGH (ref 0.61–1.24)
GFR calc Af Amer: 6 mL/min — ABNORMAL LOW (ref >60)
GFR calc non Af Amer: 5 mL/min — ABNORMAL LOW (ref >60)
Glucose, Bld: 114 mg/dL — ABNORMAL HIGH (ref 70–99)
Potassium: 4.1 mmol/L (ref 3.5–5.1)
Sodium: 138 mmol/L (ref 135–145)
Total Bilirubin: 1.1 mg/dL (ref 0.3–1.2)
Total Protein: 6.1 g/dL — ABNORMAL LOW (ref 6.5–8.1)

## 2019-02-18 LAB — IRON AND TIBC
Iron: 62 ug/dL (ref 45–182)
Saturation Ratios: 30 % (ref 17.9–39.5)
TIBC: 209 ug/dL — ABNORMAL LOW (ref 250–450)
UIBC: 147 ug/dL

## 2019-02-18 LAB — RETICULOCYTES
Immature Retic Fract: 5.9 % (ref 2.3–15.9)
RBC.: 3.19 MIL/uL — ABNORMAL LOW (ref 4.22–5.81)
Retic Count, Absolute: 48.5 10*3/uL (ref 19.0–186.0)
Retic Ct Pct: 1.5 % (ref 0.4–3.1)

## 2019-02-18 LAB — MRSA PCR SCREENING: MRSA by PCR: NEGATIVE

## 2019-02-18 LAB — ECHOCARDIOGRAM COMPLETE
Height: 74 in
Weight: 3051.17 oz

## 2019-02-18 LAB — HIV ANTIBODY (ROUTINE TESTING W REFLEX): HIV Screen 4th Generation wRfx: NONREACTIVE

## 2019-02-18 LAB — FERRITIN: Ferritin: 609 ng/mL — ABNORMAL HIGH (ref 24–336)

## 2019-02-18 LAB — VITAMIN B12: Vitamin B-12: 301 pg/mL (ref 180–914)

## 2019-02-18 LAB — MAGNESIUM: Magnesium: 2.1 mg/dL (ref 1.7–2.4)

## 2019-02-18 LAB — PHOSPHORUS: Phosphorus: 5.2 mg/dL — ABNORMAL HIGH (ref 2.5–4.6)

## 2019-02-18 LAB — TROPONIN I (HIGH SENSITIVITY): Troponin I (High Sensitivity): 72 ng/L — ABNORMAL HIGH (ref <18)

## 2019-02-18 LAB — TSH: TSH: 2.208 u[IU]/mL (ref 0.350–4.500)

## 2019-02-18 LAB — FOLATE: Folate: 9 ng/mL (ref >5.9)

## 2019-02-18 MED ORDER — LIDOCAINE HCL (PF) 1 % IJ SOLN: 5.0000 mL | INTRAMUSCULAR | Status: DC | PRN

## 2019-02-18 MED ORDER — ONDANSETRON HCL 4 MG/2ML IJ SOLN: 4.0000 mg | Freq: Four times a day (QID) | INTRAMUSCULAR | Status: DC | PRN

## 2019-02-18 MED ORDER — DOXERCALCIFEROL 4 MCG/2ML IV SOLN: 5.0000 ug | Freq: Once | INTRAVENOUS | Status: AC

## 2019-02-18 MED ORDER — PENTAFLUOROPROP-TETRAFLUOROETH EX AERO: 1.0000 "application " | INHALATION_SPRAY | CUTANEOUS | Status: DC | PRN

## 2019-02-18 MED ORDER — CLONIDINE HCL 0.1 MG PO TABS
0.1000 mg | ORAL_TABLET | Freq: Once | ORAL | Status: AC
  Administered 2019-02-18: 0.1 mg via ORAL
  Filled 2019-02-18: qty 1

## 2019-02-18 MED ORDER — HEPARIN SODIUM (PORCINE) 1000 UNIT/ML IJ SOLN
INTRAMUSCULAR | Status: AC
  Administered 2019-02-18: 6000 [IU] via INTRAVENOUS_CENTRAL
  Filled 2019-02-18: qty 6

## 2019-02-18 MED ORDER — HEPARIN SODIUM (PORCINE) 1000 UNIT/ML DIALYSIS: 6000.0000 [IU] | INTRAMUSCULAR | Status: DC | PRN

## 2019-02-18 MED ORDER — DOXERCALCIFEROL 4 MCG/2ML IV SOLN
INTRAVENOUS | Status: AC
  Administered 2019-02-18: 5 ug via INTRAVENOUS
  Filled 2019-02-18: qty 4

## 2019-02-18 MED ORDER — SODIUM CHLORIDE 0.9 % IV SOLN: 100.0000 mL | INTRAVENOUS | Status: DC | PRN

## 2019-02-18 MED ORDER — SODIUM CHLORIDE 0.9% FLUSH
3.0000 mL | Freq: Two times a day (BID) | INTRAVENOUS | Status: DC
  Administered 2019-02-18 – 2019-02-19 (×2): 3 mL via INTRAVENOUS

## 2019-02-18 MED ORDER — CARVEDILOL 25 MG PO TABS
25.0000 mg | ORAL_TABLET | Freq: Two times a day (BID) | ORAL | Status: DC
  Administered 2019-02-18 – 2019-02-19 (×4): 25 mg via ORAL
  Filled 2019-02-18 (×4): qty 1

## 2019-02-18 MED ORDER — CHLORHEXIDINE GLUCONATE CLOTH 2 % EX PADS
6.0000 | MEDICATED_PAD | Freq: Every day | CUTANEOUS | Status: DC
  Administered 2019-02-18: 6 via TOPICAL

## 2019-02-18 MED ORDER — HYDROCODONE-ACETAMINOPHEN 5-325 MG PO TABS
ORAL_TABLET | ORAL | Status: AC
  Administered 2019-02-18: 1 via ORAL
  Filled 2019-02-18: qty 1

## 2019-02-18 MED ORDER — ALTEPLASE 2 MG IJ SOLR: 2.0000 mg | Freq: Once | INTRAMUSCULAR | Status: DC | PRN

## 2019-02-18 MED ORDER — HEPARIN SODIUM (PORCINE) 5000 UNIT/ML IJ SOLN
5000.0000 [IU] | Freq: Three times a day (TID) | INTRAMUSCULAR | Status: DC
  Administered 2019-02-18 – 2019-02-19 (×5): 5000 [IU] via SUBCUTANEOUS
  Filled 2019-02-18 (×5): qty 1

## 2019-02-18 MED ORDER — ACETAMINOPHEN 325 MG PO TABS: 650.0000 mg | ORAL_TABLET | Freq: Four times a day (QID) | ORAL | Status: DC | PRN

## 2019-02-18 MED ORDER — HYDRALAZINE HCL 25 MG PO TABS
25.0000 mg | ORAL_TABLET | Freq: Three times a day (TID) | ORAL | Status: DC
  Administered 2019-02-18 – 2019-02-19 (×5): 25 mg via ORAL
  Filled 2019-02-18 (×6): qty 1

## 2019-02-18 MED ORDER — LIDOCAINE-PRILOCAINE 2.5-2.5 % EX CREA: 1.0000 "application " | TOPICAL_CREAM | CUTANEOUS | Status: DC | PRN

## 2019-02-18 MED ORDER — SODIUM CHLORIDE 0.9% FLUSH: 3.0000 mL | INTRAVENOUS | Status: DC | PRN

## 2019-02-18 MED ORDER — DARBEPOETIN ALFA 60 MCG/0.3ML IJ SOSY: 60.0000 ug | PREFILLED_SYRINGE | Freq: Once | INTRAMUSCULAR | Status: AC

## 2019-02-18 MED ORDER — ONDANSETRON HCL 4 MG PO TABS: 4.0000 mg | ORAL_TABLET | Freq: Four times a day (QID) | ORAL | Status: DC | PRN

## 2019-02-18 MED ORDER — DARBEPOETIN ALFA 60 MCG/0.3ML IJ SOSY
PREFILLED_SYRINGE | INTRAMUSCULAR | Status: AC
  Administered 2019-02-18: 60 ug via INTRAVENOUS
  Filled 2019-02-18: qty 0.3

## 2019-02-18 MED ORDER — HYDROCODONE-ACETAMINOPHEN 5-325 MG PO TABS: 1.0000 | ORAL_TABLET | ORAL | Status: DC | PRN

## 2019-02-18 MED ORDER — SODIUM CHLORIDE 0.9 % IV SOLN: 250.0000 mL | INTRAVENOUS | Status: DC | PRN

## 2019-02-18 MED ORDER — AMLODIPINE BESYLATE 10 MG PO TABS
10.0000 mg | ORAL_TABLET | Freq: Every evening | ORAL | Status: DC
  Administered 2019-02-18 – 2019-02-19 (×2): 10 mg via ORAL
  Filled 2019-02-18 (×3): qty 1

## 2019-02-18 MED ORDER — HEPARIN SODIUM (PORCINE) 1000 UNIT/ML DIALYSIS: 1000.0000 [IU] | INTRAMUSCULAR | Status: DC | PRN

## 2019-02-18 MED ORDER — SEVELAMER CARBONATE 2.4 G PO PACK
2.4000 g | PACK | Freq: Three times a day (TID) | ORAL | Status: DC
  Administered 2019-02-18 – 2019-02-19 (×5): 2.4 g via ORAL
  Filled 2019-02-18 (×6): qty 1

## 2019-02-18 MED ORDER — ACETAMINOPHEN 650 MG RE SUPP: 650.0000 mg | Freq: Four times a day (QID) | RECTAL | Status: DC | PRN

## 2019-02-18 NOTE — Progress Notes (Signed)
Pt. sbp remains elevated >180 as documented. Nephrology Jen Mow PA at bedside orders to administer scheduled hydralazine dose. Pt. Stable

## 2019-02-18 NOTE — Progress Notes (Signed)
PROGRESS NOTE    Jason [name].  VCB:[id number] DOB: [birth year] DOA: 02/17/2019 PCP: [name], FNP  Brief Narrative: 59 year old male with ESRD on hemodialysis TTS, hypertension, noncompliance was sent to the emergency room with mild confusion and altered mental status during dialysis yesterday, at baseline he is AAO x4, in the emergency room reported a headache, blood pressure was in the 384Y systolic, chest x-ray was unremarkable, he was started on a nitroglycerin drip   Assessment & Plan:  Hypertensive emergency Likely hypertensive encephalopathy -Likely primarily from volume overload with missed HD this past week -Nephrology consulting, plan for extra HD today -Wean off nitroglycerin drip -Add p.o. hydralazine, continue carvedilol and amlodipine -Mental status has improved, he is AAO x3, back to baseline, no need for MRI at this time  End-stage renal disease on hemodialysis TTS -History of noncompliance -Missed 2 dialysis treatments last week and 7 in the last month -Nephrology consulting, plan for extra HD today  Anemia of chronic disease -Continue EPO  Chronic systolic and diastolic CHF -Volume managed with HD -Continue carvedilol  DVT prophylaxis: Heparin subcutaneous Code Status: Full code Family Communication: No family at bedside Disposition Plan: Home possibly tomorrow  Consultants:   Nephrology   Procedures:   Antimicrobials:    Subjective: -Mentation improved, complains of headache from nitroglycerin drip -Denies any shortness of breath  Objective: Vitals:   02/18/19 0740 02/18/19 0744 02/18/19 0839 02/18/19 1055  BP: (!) 190/113  (!) 196/113 (!) 177/111  Pulse:   72   Resp: 12  13 (!) 23  Temp:   98.8 F (37.1 C)   TempSrc:   Oral   SpO2:   92%   Weight:  86.5 kg    Height:        Intake/Output Summary (Last 24 hours) at 02/18/2019 1304 Last data filed at 02/18/2019 0046 Gross per 24 hour  Intake 6.05 ml  Output --  Net 6.05 ml    Filed Weights   02/17/19 1840 02/18/19 0744  Weight: 79.4 kg 86.5 kg    Examination:  General exam: Pleasant middle-aged male sitting comfortably in bed, AAOx3 Respiratory system: Few basilar rales Cardiovascular system: S1 & S2 heard, RRR. Gastrointestinal system: Abdomen is nondistended, soft and nontender.Normal bowel sounds heard. Central nervous system: Alert and oriented. No focal neurological deficits. Extremities: No edema Skin: No rashes, lesions or ulcers Psychiatry: Mood & affect appropriate.     Data Reviewed:   CBC: Recent Labs  Lab 02/17/19 1833 02/18/19 0219  WBC 4.2 3.7*  NEUTROABS 2.6  --   HGB 9.8* 9.7*  HCT 29.9* 28.5*  MCV 89.5 88.2  PLT 145* PLATELET CLUMPS NOTED ON SMEAR, COUNT APPEARS DECREASED   Basic Metabolic Panel: Recent Labs  Lab 02/17/19 1833 02/18/19 0219  NA 139 138  K 3.8 4.1  CL 100 99  CO2 26 26  GLUCOSE 91 114*  BUN 50* 58*  CREATININE 8.41* 9.49*  CALCIUM 10.1 9.2  MG  --  2.1  PHOS  --  5.2*   GFR: Estimated Creatinine Clearance: 10 mL/min (A) (by C-G formula based on SCr of 9.49 mg/dL (H)). Liver Function Tests: Recent Labs  Lab 02/17/19 1833 02/18/19 0219  AST 18 19  ALT 31 30  ALKPHOS 34* 35*  BILITOT 1.4* 1.1  PROT 6.7 6.1*  ALBUMIN 3.6 3.4*   No results for input(s): LIPASE, AMYLASE in the last 168 hours. No results for input(s): AMMONIA in the last 168 hours. Coagulation Profile: No  results for input(s): INR, PROTIME in the last 168 hours. Cardiac Enzymes: No results for input(s): CKTOTAL, CKMB, CKMBINDEX, TROPONINI in the last 168 hours. BNP (last 3 results) No results for input(s): PROBNP in the last 8760 hours. HbA1C: No results for input(s): HGBA1C in the last 72 hours. CBG: No results for input(s): GLUCAP in the last 168 hours. Lipid Profile: No results for input(s): CHOL, HDL, LDLCALC, TRIG, CHOLHDL, LDLDIRECT in the last 72 hours. Thyroid Function Tests: Recent Labs    02/18/19 0219   TSH 2.208   Anemia Panel: Recent Labs    02/18/19 0219  VITAMINB12 301  FOLATE 9.0  FERRITIN 609*  TIBC 209*  IRON 62  RETICCTPCT 1.5   Urine analysis:    Component Value Date/Time   COLORURINE YELLOW 01/16/2017 1420   APPEARANCEUR CLEAR 01/16/2017 1420   LABSPEC 1.013 01/16/2017 1420   PHURINE 5.0 01/16/2017 1420   GLUCOSEU NEGATIVE 01/16/2017 1420   HGBUR SMALL (A) 01/16/2017 1420   BILIRUBINUR negative 08/23/2018 0802   KETONESUR negative 08/23/2018 0802   KETONESUR NEGATIVE 01/16/2017 1420   PROTEINUR >=300 (A) 08/23/2018 0802   PROTEINUR 100 (A) 01/16/2017 1420   UROBILINOGEN 0.2 08/23/2018 0802   UROBILINOGEN 0.2 02/26/2015 1028   NITRITE Negative 08/23/2018 0802   NITRITE NEGATIVE 01/16/2017 1420   LEUKOCYTESUR Negative 08/23/2018 0802   Sepsis Labs: @LABRCNTIP (procalcitonin:4,lacticidven:4)  ) Recent Results (from the past 240 hour(s))  Respiratory Panel by RT PCR (Flu A&B, Covid) - Nasopharyngeal Swab     Status: None   Collection Time: 02/17/19  9:11 PM   Specimen: Nasopharyngeal Swab  Result Value Ref Range Status   SARS Coronavirus 2 by RT PCR NEGATIVE NEGATIVE Final    Comment: (NOTE) SARS-CoV-2 target nucleic acids are NOT DETECTED. The SARS-CoV-2 RNA is generally detectable in upper respiratoy specimens during the acute phase of infection. The lowest concentration of SARS-CoV-2 viral copies this assay can detect is 131 copies/mL. A negative result does not preclude SARS-Cov-2 infection and should not be used as the sole basis for treatment or other patient management decisions. A negative result may occur with  improper specimen collection/handling, submission of specimen other than nasopharyngeal swab, presence of viral mutation(s) within the areas targeted by this assay, and inadequate number of viral copies (<131 copies/mL). A negative result must be combined with clinical observations, patient history, and epidemiological information.  The expected result is Negative. Fact Sheet for Patients:  PinkCheek.be Fact Sheet for Healthcare Providers:  GravelBags.it This test is not yet ap proved or cleared by the [place] FDA and  has been authorized for detection and/or diagnosis of SARS-CoV-2 by FDA under an Emergency Use Authorization (EUA). This EUA will remain  in effect (meaning this test can be used) for the duration of the COVID-19 declaration under Section 564(b)(1) of the Act, 21 U.S.C. section 360bbb-3(b)(1), unless the authorization is terminated or revoked sooner.    Influenza A by PCR NEGATIVE NEGATIVE Final   Influenza B by PCR NEGATIVE NEGATIVE Final    Comment: (NOTE) The Xpert Xpress SARS-CoV-2/FLU/RSV assay is intended as an aid in  the diagnosis of influenza from Nasopharyngeal swab specimens and  should not be used as a sole basis for treatment. Nasal washings and  aspirates are unacceptable for Xpert Xpress SARS-CoV-2/FLU/RSV  testing. Fact Sheet for Patients: PinkCheek.be Fact Sheet for Healthcare Providers: GravelBags.it This test is not yet approved or cleared by the [place] FDA and  has been authorized for detection and/or diagnosis  of SARS-CoV-2 by  FDA under an Emergency Use Authorization (EUA). This EUA will remain  in effect (meaning this test can be used) for the duration of the  Covid-19 declaration under Section 564(b)(1) of the Act, 21  U.S.C. section 360bbb-3(b)(1), unless the authorization is  terminated or revoked. Performed at [facility], [place] [address] [place], [place] [address]   MRSA PCR Screening     Status: None   Collection Time: 02/18/19  7:43 AM   Specimen: Nasal Mucosa; Nasopharyngeal  Result Value Ref Range Status   MRSA by PCR NEGATIVE NEGATIVE Final    Comment:        The GeneXpert MRSA Assay (FDA approved for NASAL  specimens only), is one component of a comprehensive MRSA colonization surveillance program. It is not intended to diagnose MRSA infection nor to guide or monitor treatment for MRSA infections. Performed at [facility], [place] [address], [place], [place] [address]          Radiology Studies: CT Head Wo Contrast  Result Date: 02/17/2019 CLINICAL DATA:  Subarachnoid hemorrhage suspected. Altered mental status status post dialysis. EXAM: CT HEAD WITHOUT CONTRAST TECHNIQUE: Contiguous axial images were obtained from the base of the skull through the vertex without intravenous contrast. COMPARISON:  CT head dated January 16, 2017. FINDINGS: Brain: No evidence of acute infarction, hemorrhage, hydrocephalus, extra-axial collection or mass lesion/mass effect. Vascular: No hyperdense vessel or unexpected calcification. Skull: Normal. Negative for fracture or focal lesion. Sinuses/Orbits: No acute finding. Other: None. IMPRESSION: No acute intracranial abnormality. Electronically Signed   By: Constance Holster M.D.   On: 02/17/2019 19:27   DG Chest Port 1 View  Result Date: 02/17/2019 CLINICAL DATA:  Change in mental status EXAM: PORTABLE CHEST 1 VIEW COMPARISON:  January 20, 2017 FINDINGS: There is unchanged cardiomegaly. Mild prominence of the central pulmonary vasculature is seen. No large airspace consolidation or pleural effusion. Aortic knob calcifications. The visualized skeletal structures are unremarkable. IMPRESSION: Cardiomegaly and pulmonary vascular congestion. Electronically Signed   By: Prudencio Pair M.D.   On: 02/17/2019 19:38   ECHOCARDIOGRAM COMPLETE  Result Date: 02/18/2019   ECHOCARDIOGRAM REPORT   Patient Name:   Jason [name]. Date of Exam: 02/18/2019 Medical Rec #:  [id number]         Height:       74.0 in Accession #:    [id number]        Weight:       190.7 lb Date of Birth:  19-Jun-1960         BSA:          2.13 m Patient Age:    31 years          BP:           196/113  mmHg Patient Gender: M                 HR:           72 bpm. Exam Location:  Inpatient Procedure: 2D Echo Indications:    Elevated Troponin  History:        Patient has prior history of Echocardiogram examinations, most                 recent 08/17/2017. CHF, Arrythmias:NSVT; Risk                 Factors:Hypertension. Abnormal EKG  ESRD.  Sonographer:    Vikki Ports Turrentine Referring Phys: [place]  1. Left ventricular ejection fraction, by visual estimation, is 50 to 55%. The left ventricle has low normal function. There is moderately increased left ventricular hypertrophy.  2. Elevated left ventricular end-diastolic pressure.  3. Left ventricular diastolic parameters are consistent with Grade I diastolic dysfunction (impaired relaxation).  4. The left ventricle has no regional wall motion abnormalities.  5. Global right ventricle has normal systolic function.The right ventricular size is normal. No increase in right ventricular wall thickness.  6. Left atrial size was moderately dilated.  7. Right atrial size was normal.  8. The mitral valve is grossly normal. Mild mitral valve regurgitation.  9. The tricuspid valve is grossly normal. 10. The tricuspid valve is grossly normal. Tricuspid valve regurgitation is mild-moderate. 11. The aortic valve is tricuspid. Aortic valve regurgitation is not visualized. Very mild aortic valve stenosis. 12. The pulmonic valve was grossly normal. Pulmonic valve regurgitation is not visualized. 13. Moderately elevated pulmonary artery systolic pressure. 14. The inferior vena cava is normal in size with greater than 50% respiratory variability, suggesting right atrial pressure of 3 mmHg. FINDINGS  Left Ventricle: Left ventricular ejection fraction, by visual estimation, is 50 to 55%. The left ventricle has low normal function. The left ventricle has no regional wall motion abnormalities. The left ventricular internal cavity size was the left  ventricle is normal in size. There is moderately increased left ventricular hypertrophy. Concentric left ventricular hypertrophy. Left ventricular diastolic parameters are consistent with Grade I diastolic dysfunction (impaired relaxation). Elevated left  ventricular end-diastolic pressure. Right Ventricle: The right ventricular size is normal. No increase in right ventricular wall thickness. Global RV systolic function is has normal systolic function. The tricuspid regurgitant velocity is 3.56 m/s, and with an assumed right atrial pressure  of 3 mmHg, the estimated right ventricular systolic pressure is moderately elevated at 53.7 mmHg. Left Atrium: Left atrial size was moderately dilated. Right Atrium: Right atrial size was normal in size Pericardium: There is no evidence of pericardial effusion. Mitral Valve: The mitral valve is grossly normal. Mild mitral valve regurgitation. Tricuspid Valve: The tricuspid valve is grossly normal. Tricuspid valve regurgitation is mild-moderate. Aortic Valve: The aortic valve is tricuspid. . There is mild thickening of the aortic valve. Aortic valve regurgitation is not visualized. Mild aortic stenosis is present. Mild aortic valve annular calcification. There is mild thickening of the aortic valve. Aortic valve mean gradient measures 10.8 mmHg. Aortic valve peak gradient measures 17.3 mmHg. Aortic valve area, by VTI measures 2.01 cm. Pulmonic Valve: The pulmonic valve was grossly normal. Pulmonic valve regurgitation is not visualized. Pulmonic regurgitation is not visualized. Aorta: The aortic root is normal in size and structure. Venous: The inferior vena cava is normal in size with greater than 50% respiratory variability, suggesting right atrial pressure of 3 mmHg. IAS/Shunts: No atrial level shunt detected by color flow Doppler.  LEFT VENTRICLE PLAX 2D LVIDd:         5.65 cm  Diastology LVIDs:         4.00 cm  LV e' lateral:   3.71 cm/s LV PW:         1.30 cm  LV E/e'  lateral: 26.1 LV IVS:        1.25 cm  LV e' medial:    3.85 cm/s LVOT diam:     2.00 cm  LV E/e' medial:  25.1 LV SV:  87 ml LV SV Index:   40.79 LVOT Area:     3.14 cm  RIGHT VENTRICLE RV S prime:     14.70 cm/s TAPSE (M-mode): 2.8 cm LEFT ATRIUM             Index       RIGHT ATRIUM           Index LA diam:        5.40 cm 2.54 cm/m  RA Area:     23.60 cm LA Vol (A2C):   82.1 ml 38.55 ml/m RA Volume:   73.90 ml  34.70 ml/m LA Vol (A4C):   87.2 ml 40.94 ml/m LA Biplane Vol: 85.0 ml 39.91 ml/m  AORTIC VALVE AV Area (Vmax):    2.02 cm AV Area (Vmean):   1.97 cm AV Area (VTI):     2.01 cm AV Vmax:           208.00 cm/s AV Vmean:          154.500 cm/s AV VTI:            0.448 m AV Peak Grad:      17.3 mmHg AV Mean Grad:      10.8 mmHg LVOT Vmax:         134.00 cm/s LVOT Vmean:        96.800 cm/s LVOT VTI:          0.287 m LVOT/AV VTI ratio: 0.64  AORTA Ao Root diam: 3.40 cm MITRAL VALVE                         TRICUSPID VALVE MV Area (PHT): 3.31 cm              TR Peak grad:   50.7 mmHg MV PHT:        66.41 msec            TR Vmax:        356.00 cm/s MV Decel Time: 229 msec MV E velocity: 96.80 cm/s  103 cm/s  SHUNTS MV A velocity: 128.00 cm/s 70.3 cm/s Systemic VTI:  0.29 m MV E/A ratio:  0.76        1.5       Systemic Diam: 2.00 cm  Kate Sable MD Electronically signed by Kate Sable MD Signature Date/Time: 02/18/2019/10:33:13 AM    Final    VAS [place] LOWER EXTREMITY VENOUS (DVT)  Result Date: 02/18/2019  Lower Venous DVTStudy Indications: Edema.  Performing Technologist: Antonieta Pert RDMS, RVT  Examination Guidelines: A complete evaluation includes B-mode imaging, spectral Doppler, color Doppler, and power Doppler as needed of all accessible portions of each vessel. Bilateral testing is considered an integral part of a complete examination. Limited examinations for reoccurring indications may be performed as noted. The reflux portion of the exam is performed with the patient in reverse  Trendelenburg.  +-----+---------------+---------+-----------+----------+--------------+ RIGHTCompressibilityPhasicitySpontaneityPropertiesThrombus Aging +-----+---------------+---------+-----------+----------+--------------+ CFV  Full           Yes      Yes                                 +-----+---------------+---------+-----------+----------+--------------+   +---------+---------------+---------+-----------+----------+--------------+ LEFT     CompressibilityPhasicitySpontaneityPropertiesThrombus Aging +---------+---------------+---------+-----------+----------+--------------+ CFV      Full           Yes      Yes                                 +---------+---------------+---------+-----------+----------+--------------+  SFJ      Full                                                        +---------+---------------+---------+-----------+----------+--------------+ FV Prox  Full                                                        +---------+---------------+---------+-----------+----------+--------------+ FV Mid   Full                                         duplicated     +---------+---------------+---------+-----------+----------+--------------+ FV DistalFull                                                        +---------+---------------+---------+-----------+----------+--------------+ PFV      Full                                                        +---------+---------------+---------+-----------+----------+--------------+ POP      Full           Yes      Yes                                 +---------+---------------+---------+-----------+----------+--------------+ PTV      Full                                                        +---------+---------------+---------+-----------+----------+--------------+ PERO     Full                                                         +---------+---------------+---------+-----------+----------+--------------+ GSV      Full                                                        +---------+---------------+---------+-----------+----------+--------------+     Summary: RIGHT: - No evidence of deep vein thrombosis in the lower extremity. No indirect evidence of obstruction proximal to the inguinal ligament.  LEFT: - There is no evidence of deep vein thrombosis in the lower extremity.  - No cystic structure found in the popliteal fossa.  *See table(s) above for measurements and  observations. Electronically signed by Ruta Hinds MD on 02/18/2019 at 11:29:29 AM.    Final         Scheduled Meds: . amLODipine  10 mg Oral QPM  . carvedilol  25 mg Oral BID WC  . Chlorhexidine Gluconate Cloth  6 each Topical Q0600  . darbepoetin (ARANESP) injection - DIALYSIS  60 mcg Intravenous Once in dialysis  . doxercalciferol  5 mcg Intravenous Once in dialysis  . heparin  5,000 Units Subcutaneous Q8H  . hydrALAZINE  25 mg Oral Q8H  . sevelamer carbonate  2.4 g Oral TID WC  . sodium chloride flush  3 mL Intravenous Q12H   Continuous Infusions: . sodium chloride    . nitroGLYCERIN 40 mcg/min (02/18/19 0850)     LOS: 0 days    Time spent: 66min  Domenic Polite, MD [facility]  02/18/2019, 1:04 PM

## 2019-02-18 NOTE — Progress Notes (Signed)
SBP 201 Dr. Carolin Sicks aware orders for clonidine 0.1mg  x1 dose

## 2019-02-18 NOTE — Consult Note (Addendum)
[facility] Renal Consultation Note    Indication for Consultation:  Management of ESRD/hemodialysis; anemia, hypertension/volume and secondary hyperparathyroidism  ZSW:FUXNAT, Ellie Lunch, FNP  HPI: Jason [name]. is a 59 y.o. male. ESRD on HD TTS at [facility], first starting in 01/2017.  Past medical history significant for poorly controlled HTN, pulmonary HTN, diastolic CHF, mediastinal mass and medical noncompliance.  Of note patient is non compliant with prescribed dialysis regimen.  He has a history of large intradialytic weight gains, missed 2 of 3 treatments this week and 7 treatments in the last month.     Patient presented to the ED last night due to altered mental status.  He completed his OP dialysis and was noted to be lethargic, confused and slow to respond post treatment.  Blood pressures were elevated 230s/130s with no response to clonidine so he was sent to the ED. Net total of 3.9L removed during dialysis and patient left 3.5kg over dry weight.   Seen and examined at bedside.  He started feeling back to normal a few hours after dialysis yesterday.  Blood pressures have been elevated lately and reports compliance with medications.  Denies SOB, CP, n/v/d, abdominal pain, weakness, dizziness, fatigue and confusion. Admits to increased swelling in LLE vs RLE, which has been present for about a year.  Pertinent findings in work up include hypertension with BP 200s/100s, CXR showing vascular congestion, standing weight 86.5kg, CT head with no acute findings, SCr 9.49, BUN 58, and flat troponin.   Past Medical History:  Diagnosis Date  . A-V fistula (HCC)    Right arm  . Abdominal hernia   . AKI (acute kidney injury) ([place]) 01/2015  . Anasarca   . Anemia   . Asthma    as a child  . Bilateral inguinal hernia   . Bilateral pleural effusion 01/2017  . Cellulitis 01/16/2017   Bilateral lower extremity  . CHF (congestive heart failure) ([place])   . Chronic venous  insufficiency   . Concentric left ventricular hypertrophy 08/17/2017   Moderated, noted on ECHO  . Diastolic dysfunction 55/73/2202   noted on ECHO  . Elevated LFTs    per patient, resolved  . End stage renal disease [facility])    Dialysis T/Th/Sa  Started 02/2017/pt is waiting for a kidney transplant  . H/O right inguinal hernia repair 11/2017  . History of colonoscopy 03/20/2018  . History of elevated PSA 10/2017  . Hypertension   . Pneumonia   . Pulmonary hypertension (HCC)    Moderate  . Venous stasis ulcer ([place])    bil legs/ healed up now per pt (03/06/18)   Past Surgical History:  Procedure Laterality Date  . AV FISTULA PLACEMENT Right 01/20/2017   Procedure: ARTERIOVENOUS (AV) FISTULA CREATION;  Surgeon: Angelia Mould, MD;  Location: [place];  Service: Vascular;  Laterality: Right;  . HERNIA REPAIR    . INSERTION OF DIALYSIS CATHETER Right 01/20/2017   Procedure: INSERTION OF DIALYSIS CATHETER;  Surgeon: Angelia Mould, MD;  Location: [place];  Service: Vascular;  Laterality: Right;  . INSERTION OF MESH N/A 11/18/2017   Procedure: INSERTION OF MESH;  Surgeon: Michael Boston, MD;  Location: [facility];  Service: General;  Laterality: N/A;  . IR FLUORO GUIDE CV LINE RIGHT  01/18/2017  . IR US GUIDE VASC ACCESS RIGHT  01/18/2017  . LAPAROSCOPIC INGUINAL HERNIA WITH UMBILICAL HERNIA Bilateral 11/18/2017   Procedure: LAPAROSCOPIC BILATERAL INGUINAL HERNIA REPAIR WITH UMBILICAL HERNIA REPAIR WITH MESH;  Surgeon: Michael Boston, MD;  Location: [facility];  Service: General;  Laterality: Bilateral;  . TOE SURGERY     right fracture in big toe  . TONSILLECTOMY     Family History  Problem Relation Age of Onset  . Heart attack Mother 81       CABG hx  . Heart disease Mother   . Hypertension Mother   . Stroke Mother   . Diabetes Father   . Healthy Brother   . Prostate cancer Maternal Grandfather   . Colon cancer Neg Hx   . Colon polyps Neg Hx   . Esophageal cancer Neg Hx   . Stomach  cancer Neg Hx   . Rectal cancer Neg Hx    Social History:  reports that he quit smoking about 24 years ago. He quit after 17.00 years of use. He has never used smokeless tobacco. He reports previous alcohol use. He reports previous drug use. Allergies  Allergen Reactions  . Lisinopril Cough   Prior to Admission medications   Medication Sig Start Date End Date Taking? Authorizing Provider  amLODipine (NORVASC) 10 MG tablet Take 10 mg by mouth every evening. 06/20/18   [provider]  carvedilol (COREG) 25 MG tablet Take 1 tablet (25 mg total) by mouth 2 (two) times daily with a meal. 02/02/18   [name], FNP  lidocaine-prilocaine (EMLA) cream Apply 1 application topically See admin instructions. Apply 1-2 hours before dialysis. Cover with occlusive dressing 3 days weekly (Tuesday, Thursday, & Saturdays) 06/07/17   [provider]  multivitamin (RENA-VIT) TABS tablet Take 1 tablet by mouth daily. 10/20/17   [provider]  NON FORMULARY Vit d IV-prior to dialysis 3 times a week ( per pt)    [provider]  OVER THE COUNTER MEDICATION Renadyl - Take 2 capsules by mouth daily.    [provider]   Current Facility-Administered Medications  Medication Dose Route Frequency Provider Last Rate Last Admin  . 0.9 %  sodium chloride infusion  250 mL Intravenous PRN Toy Baker, MD      . acetaminophen (TYLENOL) tablet 650 mg  650 mg Oral Q6H PRN Doutova, Anastassia, MD       Or  . acetaminophen (TYLENOL) suppository 650 mg  650 mg Rectal Q6H PRN Doutova, Anastassia, MD      . amLODipine (NORVASC) tablet 10 mg  10 mg Oral QPM Doutova, Anastassia, MD      . carvedilol (COREG) tablet 25 mg  25 mg Oral BID WC Doutova, Anastassia, MD      . heparin injection 5,000 Units  5,000 Units Subcutaneous Q8H Toy Baker, MD   5,000 Units at 02/18/19 0518  . HYDROcodone-acetaminophen (NORCO/VICODIN) 5-325 MG per tablet 1-2 tablet  1-2 tablet Oral  Q4H PRN Doutova, Anastassia, MD      . nitroGLYCERIN 50 mg in dextrose 5 % 250 mL (0.2 mg/mL) infusion  0-200 mcg/min Intravenous Continuous Doutova, Anastassia, MD 10.5 mL/hr at 02/18/19 0521 35 mcg/min at 02/18/19 0521  . ondansetron (ZOFRAN) tablet 4 mg  4 mg Oral Q6H PRN Toy Baker, MD       Or  . ondansetron (ZOFRAN) injection 4 mg  4 mg Intravenous Q6H PRN Doutova, Anastassia, MD      . sodium chloride flush (NS) 0.9 % injection 3 mL  3 mL Intravenous Q12H Doutova, Anastassia, MD      . sodium chloride flush (NS) 0.9 % injection 3 mL  3 mL Intravenous PRN  Toy Baker, MD       Labs: Basic Metabolic Panel: Recent Labs  Lab 02/17/19 1833 02/18/19 0219  NA 139 138  K 3.8 4.1  CL 100 99  CO2 26 26  GLUCOSE 91 114*  BUN 50* 58*  CREATININE 8.41* 9.49*  CALCIUM 10.1 9.2  PHOS  --  5.2*   Liver Function Tests: Recent Labs  Lab 02/17/19 1833 02/18/19 0219  AST 18 19  ALT 31 30  ALKPHOS 34* 35*  BILITOT 1.4* 1.1  PROT 6.7 6.1*  ALBUMIN 3.6 3.4*   CBC: Recent Labs  Lab 02/17/19 1833 02/18/19 0219  WBC 4.2 3.7*  NEUTROABS 2.6  --   HGB 9.8* 9.7*  HCT 29.9* 28.5*  MCV 89.5 88.2  PLT 145* PLATELET CLUMPS NOTED ON SMEAR, COUNT APPEARS DECREASED   Iron Studies:  Recent Labs    02/18/19 0219  IRON 62  TIBC 209*  FERRITIN 609*   Studies/Results: CT Head Wo Contrast  Result Date: 02/17/2019 CLINICAL DATA:  Subarachnoid hemorrhage suspected. Altered mental status status post dialysis. EXAM: CT HEAD WITHOUT CONTRAST TECHNIQUE: Contiguous axial images were obtained from the base of the skull through the vertex without intravenous contrast. COMPARISON:  CT head dated January 16, 2017. FINDINGS: Brain: No evidence of acute infarction, hemorrhage, hydrocephalus, extra-axial collection or mass lesion/mass effect. Vascular: No hyperdense vessel or unexpected calcification. Skull: Normal. Negative for fracture or focal lesion. Sinuses/Orbits: No acute finding.  Other: None. IMPRESSION: No acute intracranial abnormality. Electronically Signed   By: Constance Holster M.D.   On: 02/17/2019 19:27   DG Chest Port 1 View  Result Date: 02/17/2019 CLINICAL DATA:  Change in mental status EXAM: PORTABLE CHEST 1 VIEW COMPARISON:  January 20, 2017 FINDINGS: There is unchanged cardiomegaly. Mild prominence of the central pulmonary vasculature is seen. No large airspace consolidation or pleural effusion. Aortic knob calcifications. The visualized skeletal structures are unremarkable. IMPRESSION: Cardiomegaly and pulmonary vascular congestion. Electronically Signed   By: Prudencio Pair M.D.   On: 02/17/2019 19:38    ROS: All others negative except those listed in HPI.  Physical Exam: Vitals:   02/18/19 0610 02/18/19 0710 02/18/19 0740 02/18/19 0744  BP: (!) 176/90 (!) 186/95 (!) 190/113   Pulse:      Resp: 19 (!) 21 12   Temp:      TempSrc:      SpO2:      Weight:    86.5 kg  Height:         General: WDWN, NAD, chronically ill appearing male Head: NCAT sclera not icteric MMM Neck: Supple. No lymphadenopathy Lungs: mostly CTA bilaterally. No wheeze, rales or rhonchi. Breathing is unlabored. Heart: RRR. No murmur, rubs or gallops.  Abdomen: soft, nontender, +BS, no guarding, no rebound tenderness.  Lower extremities:trace edema on L, no edema on R, no ischemic changes, or open wounds  Neuro: AAOx3. Moves all extremities spontaneously. Psych:  Responds to questions appropriately with a normal affect. Dialysis Access: aneurysmal RU AVF +b/t  Dialysis Orders:  TTS - [facility]  4.25hrs, BFR 450, DFR 800,  EDW 84kg, 2K/ 3.5Ca  Access: RU AVF  Heparin 6000 Mircera 60 mcg q2wks - last 1/19 Venofer 50 qwk Hectorol 36mcg IV qHD    Assessment/Plan: 1.  Hypertensive emergency - on PO home meds and nitro drip. BP improved to 170-180s/90-100s. Likely partially due to increased volume.  Expect improvement post HD.  2.  ESRD -  On HD TTS.  Noncompliant, missed 2  treatments this week.  Plan for HD today using 3K bath. K 4.1.  3. Volume  - CXR shows vascular congestion, trace edema on exam, BP elevated, 2.5kg over dry weight which they have recently been lowering.  Likely has loss weight. Plan for UF 3-4L as tolerated. Keep SBP>150.  4.  Anemia of CKD - Hgb 9.7, tsat 30%. Missed last ESA dose, will give today, aranesp 20mcg. 5.  Secondary Hyperparathyroidism -  Ca and phos in goal. Continue binders, VDRA 6.  Nutrition - Renal diet w/fluid restrictions.   Jen Mow, PA-C [facility] Pager: [phone] 02/18/2019, 7:48 AM   Nephrology attending:  Patient was seen and examined at bedside.  Chart and available lab, imaging studies reviewed.  I agree with assessment and plan as outlined above.  ESRD on HD, noncompliant with outpatient treatment admitted with hypertensive emergency and fluid overload/acute pulmonary edema.  He is currently on nitro for hypertensive emergency, SBP around 250s in ER. He reports some shortness of breath but denied chest pain, nausea or vomiting. We will plan for dialysis today for extra UF.  Continue current medication.  I have discussed with the dialysis on-call nurse.  Lawson Radar, MD [facility].

## 2019-02-18 NOTE — ED Notes (Signed)
Report attempted. RN to call back.

## 2019-02-18 NOTE — Progress Notes (Signed)
Left lower extremity venous duplex complete.  Please see CV Proc tab for preliminary results. Lita Mains- RDMS, RVT 11:03 AM  02/18/2019

## 2019-02-18 NOTE — Progress Notes (Signed)
Dr. Carolin Sicks at pt. Bedside during HD tx. sbp 201. Orders to continue uf as ordered for 4L. Pt. Currently on nitro drip as documented. Pt. Stable.

## 2019-02-18 NOTE — Progress Notes (Signed)
  Echocardiogram 2D Echocardiogram has been performed.  Jason [name] A Jason [name] 02/18/2019, 9:14 AM

## 2019-02-19 MED ORDER — HYDRALAZINE HCL 25 MG PO TABS: 25.0000 mg | ORAL_TABLET | Freq: Three times a day (TID) | ORAL | 1 refills | Status: DC

## 2019-02-19 NOTE — Progress Notes (Signed)
Subjective:  No cos , thinks may dc today/   Objective Vital signs in last 24 hours: Vitals:   02/19/19 0041 02/19/19 0555 02/19/19 0747 02/19/19 0846  BP: (!) 165/98 (!) 178/105 (!) 179/107 (!) 156/93  Pulse: 75 74    Resp: 17 (!) 22 (!) 23 11  Temp:  99.3 F (37.4 C)    TempSrc:  Oral    SpO2:  91%    Weight:  82.3 kg    Height:       Weight change: 7.121 kg  Physical Exam: General:  Alert NAD male Lungs: CTA bilaterally. No wheeze, rales or rhonchi. Breathing is unlabored. Heart: RRR. No murmur, rubs or gallops.  Abdomen: soft, nontender,nondistended  +BS, no  Lower extremities: no pedal edema Dialysis Access: aneurysmal RU AVF +b/t  Dialysis Orders:  TTS - [facility]  4.25hrs, BFR 450, DFR 800,  EDW 84kg, 2K/ 3.5Ca  Access: RU AVF  Heparin 6000 Mircera 60 mcg q2wks - last 1/19 Venofer 50 qwk Hectorol 57mcg IV qHD    Problem/Plan: 1.  Hypertensive emergency -due to  Missed HD X 2 with bp improved this am 4 l uf hd yest. / below edw today  And lower edw on dc and continue home PO meds Expect improvement post HD.  2.  ESRD -  On HD TTS.  Noncompliant, missed 2 treatments last week. HD yest next tomor  Ok from  renal standpoint for dc    3. Volume  - CXR shows vascular congestion, trace edema on admit exam, BP elevated, 2.5kg over dry weight which they have recently been lowering.  Likely has loss weight. lowered at dc 82.5 4.  Anemia of CKD - Hgb 9.2, tsat 30%. Missed last ESA dose, will given 02/18/19  aranesp 41mcg. 5.  Secondary Hyperparathyroidism -  Ca and phos in goal. Continue binders, VDRA 6. Nutrition - Renal diet w/fluid restrictions.  Ernest Haber, PA-C [facility] Beeper [phone] 02/19/2019,11:13 AM  LOS: 1 day   Labs: Basic Metabolic Panel: Recent Labs  Lab 02/17/19 1833 02/18/19 0219 02/18/19 1311  NA 139 138 136  K 3.8 4.1 4.9  CL 100 99 100  CO2 26 26 25   GLUCOSE 91 114* 129*  BUN 50* 58* 62*  CREATININE 8.41* 9.49* 10.42*   CALCIUM 10.1 9.2 8.8*  PHOS  --  5.2* 6.4*   Liver Function Tests: Recent Labs  Lab 02/17/19 1833 02/18/19 0219 02/18/19 1311  AST 18 19  --   ALT 31 30  --   ALKPHOS 34* 35*  --   BILITOT 1.4* 1.1  --   PROT 6.7 6.1*  --   ALBUMIN 3.6 3.4* 3.2*   No results for input(s): LIPASE, AMYLASE in the last 168 hours. No results for input(s): AMMONIA in the last 168 hours. CBC: Recent Labs  Lab 02/17/19 1833 02/18/19 0219 02/18/19 1311  WBC 4.2 3.7* 3.0*  NEUTROABS 2.6  --   --   HGB 9.8* 9.7* 9.2*  HCT 29.9* 28.5* 26.7*  MCV 89.5 88.2 89.6  PLT 145* PLATELET CLUMPS NOTED ON SMEAR, COUNT APPEARS DECREASED 145*   Cardiac Enzymes: No results for input(s): CKTOTAL, CKMB, CKMBINDEX, TROPONINI in the last 168 hours. CBG: No results for input(s): GLUCAP in the last 168 hours.  Studies/Results: CT Head Wo Contrast  Result Date: 02/17/2019 CLINICAL DATA:  Subarachnoid hemorrhage suspected. Altered mental status status post dialysis. EXAM: CT HEAD WITHOUT CONTRAST TECHNIQUE: Contiguous axial images were obtained from the base  of the skull through the vertex without intravenous contrast. COMPARISON:  CT head dated January 16, 2017. FINDINGS: Brain: No evidence of acute infarction, hemorrhage, hydrocephalus, extra-axial collection or mass lesion/mass effect. Vascular: No hyperdense vessel or unexpected calcification. Skull: Normal. Negative for fracture or focal lesion. Sinuses/Orbits: No acute finding. Other: None. IMPRESSION: No acute intracranial abnormality. Electronically Signed   By: Constance Holster M.D.   On: 02/17/2019 19:27   DG Chest Port 1 View  Result Date: 02/17/2019 CLINICAL DATA:  Change in mental status EXAM: PORTABLE CHEST 1 VIEW COMPARISON:  January 20, 2017 FINDINGS: There is unchanged cardiomegaly. Mild prominence of the central pulmonary vasculature is seen. No large airspace consolidation or pleural effusion. Aortic knob calcifications. The visualized skeletal  structures are unremarkable. IMPRESSION: Cardiomegaly and pulmonary vascular congestion. Electronically Signed   By: Prudencio Pair M.D.   On: 02/17/2019 19:38   ECHOCARDIOGRAM COMPLETE  Result Date: 02/18/2019   ECHOCARDIOGRAM REPORT   Patient Name:   Jason [name]. Date of Exam: 02/18/2019 Medical Rec #:  [id number]         Height:       74.0 in Accession #:    [id number]        Weight:       190.7 lb Date of Birth:  [birth year]         BSA:          2.13 m Patient Age:    59 years          BP:           196/113 mmHg Patient Gender: M                 HR:           72 bpm. Exam Location:  Inpatient Procedure: 2D Echo Indications:    Elevated Troponin  History:        Patient has prior history of Echocardiogram examinations, most                 recent 08/17/2017. CHF, Arrythmias:NSVT; Risk                 Factors:Hypertension. Abnormal EKG                 ESRD.  Sonographer:    Vikki Ports Turrentine Referring Phys: [place]  1. Left ventricular ejection fraction, by visual estimation, is 50 to 55%. The left ventricle has low normal function. There is moderately increased left ventricular hypertrophy.  2. Elevated left ventricular end-diastolic pressure.  3. Left ventricular diastolic parameters are consistent with Grade I diastolic dysfunction (impaired relaxation).  4. The left ventricle has no regional wall motion abnormalities.  5. Global right ventricle has normal systolic function.The right ventricular size is normal. No increase in right ventricular wall thickness.  6. Left atrial size was moderately dilated.  7. Right atrial size was normal.  8. The mitral valve is grossly normal. Mild mitral valve regurgitation.  9. The tricuspid valve is grossly normal. 10. The tricuspid valve is grossly normal. Tricuspid valve regurgitation is mild-moderate. 11. The aortic valve is tricuspid. Aortic valve regurgitation is not visualized. Very mild aortic valve stenosis. 12. The pulmonic valve was  grossly normal. Pulmonic valve regurgitation is not visualized. 13. Moderately elevated pulmonary artery systolic pressure. 14. The inferior vena cava is normal in size with greater than 50% respiratory variability, suggesting right atrial pressure of 3 mmHg. FINDINGS  Left Ventricle: Left  ventricular ejection fraction, by visual estimation, is 50 to 55%. The left ventricle has low normal function. The left ventricle has no regional wall motion abnormalities. The left ventricular internal cavity size was the left ventricle is normal in size. There is moderately increased left ventricular hypertrophy. Concentric left ventricular hypertrophy. Left ventricular diastolic parameters are consistent with Grade I diastolic dysfunction (impaired relaxation). Elevated left  ventricular end-diastolic pressure. Right Ventricle: The right ventricular size is normal. No increase in right ventricular wall thickness. Global RV systolic function is has normal systolic function. The tricuspid regurgitant velocity is 3.56 m/s, and with an assumed right atrial pressure  of 3 mmHg, the estimated right ventricular systolic pressure is moderately elevated at 53.7 mmHg. Left Atrium: Left atrial size was moderately dilated. Right Atrium: Right atrial size was normal in size Pericardium: There is no evidence of pericardial effusion. Mitral Valve: The mitral valve is grossly normal. Mild mitral valve regurgitation. Tricuspid Valve: The tricuspid valve is grossly normal. Tricuspid valve regurgitation is mild-moderate. Aortic Valve: The aortic valve is tricuspid. . There is mild thickening of the aortic valve. Aortic valve regurgitation is not visualized. Mild aortic stenosis is present. Mild aortic valve annular calcification. There is mild thickening of the aortic valve. Aortic valve mean gradient measures 10.8 mmHg. Aortic valve peak gradient measures 17.3 mmHg. Aortic valve area, by VTI measures 2.01 cm. Pulmonic Valve: The pulmonic valve  was grossly normal. Pulmonic valve regurgitation is not visualized. Pulmonic regurgitation is not visualized. Aorta: The aortic root is normal in size and structure. Venous: The inferior vena cava is normal in size with greater than 50% respiratory variability, suggesting right atrial pressure of 3 mmHg. IAS/Shunts: No atrial level shunt detected by color flow Doppler.  LEFT VENTRICLE PLAX 2D LVIDd:         5.65 cm  Diastology LVIDs:         4.00 cm  LV e' lateral:   3.71 cm/s LV PW:         1.30 cm  LV E/e' lateral: 26.1 LV IVS:        1.25 cm  LV e' medial:    3.85 cm/s LVOT diam:     2.00 cm  LV E/e' medial:  25.1 LV SV:         87 ml LV SV Index:   40.79 LVOT Area:     3.14 cm  RIGHT VENTRICLE RV S prime:     14.70 cm/s TAPSE (M-mode): 2.8 cm LEFT ATRIUM             Index       RIGHT ATRIUM           Index LA diam:        5.40 cm 2.54 cm/m  RA Area:     23.60 cm LA Vol (A2C):   82.1 ml 38.55 ml/m RA Volume:   73.90 ml  34.70 ml/m LA Vol (A4C):   87.2 ml 40.94 ml/m LA Biplane Vol: 85.0 ml 39.91 ml/m  AORTIC VALVE AV Area (Vmax):    2.02 cm AV Area (Vmean):   1.97 cm AV Area (VTI):     2.01 cm AV Vmax:           208.00 cm/s AV Vmean:          154.500 cm/s AV VTI:            0.448 m AV Peak Grad:      17.3 mmHg AV Mean Grad:  10.8 mmHg LVOT Vmax:         134.00 cm/s LVOT Vmean:        96.800 cm/s LVOT VTI:          0.287 m LVOT/AV VTI ratio: 0.64  AORTA Ao Root diam: 3.40 cm MITRAL VALVE                         TRICUSPID VALVE MV Area (PHT): 3.31 cm              TR Peak grad:   50.7 mmHg MV PHT:        66.41 msec            TR Vmax:        356.00 cm/s MV Decel Time: 229 msec MV E velocity: 96.80 cm/s  103 cm/s  SHUNTS MV A velocity: 128.00 cm/s 70.3 cm/s Systemic VTI:  0.29 m MV E/A ratio:  0.76        1.5       Systemic Diam: 2.00 cm  Kate Sable MD Electronically signed by Kate Sable MD Signature Date/Time: 02/18/2019/10:33:13 AM    Final    VAS [place] LOWER EXTREMITY VENOUS  (DVT)  Result Date: 02/18/2019  Lower Venous DVTStudy Indications: Edema.  Performing Technologist: Antonieta Pert RDMS, RVT  Examination Guidelines: A complete evaluation includes B-mode imaging, spectral Doppler, color Doppler, and power Doppler as needed of all accessible portions of each vessel. Bilateral testing is considered an integral part of a complete examination. Limited examinations for reoccurring indications may be performed as noted. The reflux portion of the exam is performed with the patient in reverse Trendelenburg.  +-----+---------------+---------+-----------+----------+--------------+ RIGHTCompressibilityPhasicitySpontaneityPropertiesThrombus Aging +-----+---------------+---------+-----------+----------+--------------+ CFV  Full           Yes      Yes                                 +-----+---------------+---------+-----------+----------+--------------+   +---------+---------------+---------+-----------+----------+--------------+ LEFT     CompressibilityPhasicitySpontaneityPropertiesThrombus Aging +---------+---------------+---------+-----------+----------+--------------+ CFV      Full           Yes      Yes                                 +---------+---------------+---------+-----------+----------+--------------+ SFJ      Full                                                        +---------+---------------+---------+-----------+----------+--------------+ FV Prox  Full                                                        +---------+---------------+---------+-----------+----------+--------------+ FV Mid   Full                                         duplicated     +---------+---------------+---------+-----------+----------+--------------+ FV DistalFull                                                        +---------+---------------+---------+-----------+----------+--------------+  PFV      Full                                                         +---------+---------------+---------+-----------+----------+--------------+ POP      Full           Yes      Yes                                 +---------+---------------+---------+-----------+----------+--------------+ PTV      Full                                                        +---------+---------------+---------+-----------+----------+--------------+ PERO     Full                                                        +---------+---------------+---------+-----------+----------+--------------+ GSV      Full                                                        +---------+---------------+---------+-----------+----------+--------------+     Summary: RIGHT: - No evidence of deep vein thrombosis in the lower extremity. No indirect evidence of obstruction proximal to the inguinal ligament.  LEFT: - There is no evidence of deep vein thrombosis in the lower extremity.  - No cystic structure found in the popliteal fossa.  *See table(s) above for measurements and observations. Electronically signed by Ruta Hinds MD on 02/18/2019 at 11:29:29 AM.    Final    Medications: . sodium chloride    . nitroGLYCERIN 10 mcg/min (02/19/19 0822)   . amLODipine  10 mg Oral QPM  . carvedilol  25 mg Oral BID WC  . Chlorhexidine Gluconate Cloth  6 each Topical Q0600  . heparin  5,000 Units Subcutaneous Q8H  . hydrALAZINE  25 mg Oral Q8H  . sevelamer carbonate  2.4 g Oral TID WC  . sodium chloride flush  3 mL Intravenous Q12H

## 2019-02-20 DIAGNOSIS — N186 End stage renal disease: Secondary | ICD-10-CM | POA: Diagnosis not present

## 2019-02-20 DIAGNOSIS — D631 Anemia in chronic kidney disease: Secondary | ICD-10-CM | POA: Diagnosis not present

## 2019-02-20 DIAGNOSIS — D509 Iron deficiency anemia, unspecified: Secondary | ICD-10-CM | POA: Diagnosis not present

## 2019-02-20 DIAGNOSIS — N2581 Secondary hyperparathyroidism of renal origin: Secondary | ICD-10-CM | POA: Diagnosis not present

## 2019-02-20 DIAGNOSIS — Z992 Dependence on renal dialysis: Secondary | ICD-10-CM | POA: Diagnosis not present

## 2019-02-22 DIAGNOSIS — N186 End stage renal disease: Secondary | ICD-10-CM | POA: Diagnosis not present

## 2019-02-22 DIAGNOSIS — Z992 Dependence on renal dialysis: Secondary | ICD-10-CM | POA: Diagnosis not present

## 2019-02-22 DIAGNOSIS — D631 Anemia in chronic kidney disease: Secondary | ICD-10-CM | POA: Diagnosis not present

## 2019-02-22 DIAGNOSIS — D509 Iron deficiency anemia, unspecified: Secondary | ICD-10-CM | POA: Diagnosis not present

## 2019-02-22 DIAGNOSIS — N2581 Secondary hyperparathyroidism of renal origin: Secondary | ICD-10-CM | POA: Diagnosis not present

## 2019-02-23 ENCOUNTER — Ambulatory Visit: Payer: Self-pay | Admitting: Family Medicine

## 2019-02-27 DIAGNOSIS — D631 Anemia in chronic kidney disease: Secondary | ICD-10-CM | POA: Diagnosis not present

## 2019-02-27 DIAGNOSIS — Z992 Dependence on renal dialysis: Secondary | ICD-10-CM | POA: Diagnosis not present

## 2019-02-27 DIAGNOSIS — D509 Iron deficiency anemia, unspecified: Secondary | ICD-10-CM | POA: Diagnosis not present

## 2019-02-27 DIAGNOSIS — N186 End stage renal disease: Secondary | ICD-10-CM | POA: Diagnosis not present

## 2019-02-27 DIAGNOSIS — N2581 Secondary hyperparathyroidism of renal origin: Secondary | ICD-10-CM | POA: Diagnosis not present

## 2019-02-28 NOTE — Discharge Summary (Signed)
Physician Discharge Summary  Jason [name]. HEN:[id number] DOB: 07-30-1960 DOA: 02/17/2019  PCP: [name], FNP  Admit date: 02/17/2019 Discharge date: 02/19/2019  Time spent: 35 minutes  Recommendations for Outpatient Follow-up:  PCP in 1 week Cardiology Dr. [name] in 1 month  Discharge Diagnoses:  Principal Problem:   Hypertensive emergency Active Problems:   Anemia   Essential hypertension   Elevated troponin   ESRD (end stage renal disease) ([place])   Hypertensive urgency   Leg edema, left   Discharge Condition: Improved  Diet recommendation: Renal  Filed Weights   02/18/19 1240 02/18/19 1648 02/19/19 0555  Weight: 86.5 kg 84.5 kg 82.3 kg    History of present illness:   59 year old male with ESRD on hemodialysis TTS, hypertension, noncompliance was sent to the emergency room with mild confusion and altered mental status during dialysis yesterday, at baseline he is AAO x4, in the emergency room reported a headache, blood pressure was in the 361W systolic, chest x-ray was unremarkable, he was started on a nitroglycerin drip  Hospital Course:   Hypertensive emergency Likely hypertensive encephalopathy -Likely primarily from volume overload with missed HD this past week -Nephrology consulted, underwent 2 extra hemodialysis treatments and subsequently improved, weaned off nitroglycerin drip -Continued on carvedilol and amlodipine, added p.o. hydralazine at discharge  -Mental status improved to baseline without any focal deficits at the time of discharge   End-stage renal disease on hemodialysis TTS -History of noncompliance -Missed 2 dialysis treatments last week and 7 in the last month -Nephrology consulted and received extra dialysis treatments, emphasized importance of compliance with dialysis  Anemia of chronic disease -Continue EPO  Chronic systolic and diastolic CHF -Volume managed with HD -Continue carvedilol   Discharge Exam: Vitals:    02/19/19 1433 02/19/19 1645  BP: (!) 173/92 (!) 161/104  Pulse:  78  Resp: 19 19  Temp:  98.2 F (36.8 C)  SpO2:  92%    General: AAOx3 Cardiovascular: S1S2/RRR Respiratory: CTAB  Discharge Instructions   Discharge Instructions    Discharge instructions   Complete by: As directed    Renal Diet   Increase activity slowly   Complete by: As directed      Allergies as of 02/19/2019      Reactions   Lisinopril Cough      Medication List    TAKE these medications   amLODipine 10 MG tablet Commonly known as: NORVASC Take 10 mg by mouth every evening.   carvedilol 25 MG tablet Commonly known as: COREG Take 1 tablet (25 mg total) by mouth 2 (two) times daily with a meal.   hydrALAZINE 25 MG tablet Commonly known as: APRESOLINE Take 1 tablet (25 mg total) by mouth every 8 (eight) hours.   multivitamin Tabs tablet Take 1 tablet by mouth daily.   Renvela 2.4 g Pack Generic drug: sevelamer carbonate Take 2.4 g by mouth 3 (three) times daily with meals.      Allergies  Allergen Reactions  . Lisinopril Cough   Follow-up Information    [name], FNP. Schedule an appointment as soon as possible for a visit in 1 week(s).   Specialty: Family Medicine Contact information: [place] [address] [phone]        Lelon Perla, MD .   Specialty: Cardiology Contact information: [address] [place] [address] [phone]            The results of significant diagnostics from this  hospitalization (including imaging, microbiology, ancillary and laboratory) are listed below for reference.    Significant Diagnostic Studies: CT Head Wo Contrast  Result Date: 02/17/2019 CLINICAL DATA:  Subarachnoid hemorrhage suspected. Altered mental status status post dialysis. EXAM: CT HEAD WITHOUT CONTRAST TECHNIQUE: Contiguous axial images were obtained from the base of the skull through the vertex without intravenous  contrast. COMPARISON:  CT head dated January 16, 2017. FINDINGS: Brain: No evidence of acute infarction, hemorrhage, hydrocephalus, extra-axial collection or mass lesion/mass effect. Vascular: No hyperdense vessel or unexpected calcification. Skull: Normal. Negative for fracture or focal lesion. Sinuses/Orbits: No acute finding. Other: None. IMPRESSION: No acute intracranial abnormality. Electronically Signed   By: Constance Holster M.D.   On: 02/17/2019 19:27   DG Chest Port 1 View  Result Date: 02/17/2019 CLINICAL DATA:  Change in mental status EXAM: PORTABLE CHEST 1 VIEW COMPARISON:  January 20, 2017 FINDINGS: There is unchanged cardiomegaly. Mild prominence of the central pulmonary vasculature is seen. No large airspace consolidation or pleural effusion. Aortic knob calcifications. The visualized skeletal structures are unremarkable. IMPRESSION: Cardiomegaly and pulmonary vascular congestion. Electronically Signed   By: Prudencio Pair M.D.   On: 02/17/2019 19:38   ECHOCARDIOGRAM COMPLETE  Result Date: 02/18/2019   ECHOCARDIOGRAM REPORT   Patient Name:   Jason [name]. Date of Exam: 02/18/2019 Medical Rec #:  [id number]         Height:       74.0 in Accession #:    [id number]        Weight:       190.7 lb Date of Birth:  30-Apr-1960         BSA:          2.13 m Patient Age:    59 years          BP:           196/113 mmHg Patient Gender: M                 HR:           72 bpm. Exam Location:  Inpatient Procedure: 2D Echo Indications:    Elevated Troponin  History:        Patient has prior history of Echocardiogram examinations, most                 recent 08/17/2017. CHF, Arrythmias:NSVT; Risk                 Factors:Hypertension. Abnormal EKG                 ESRD.  Sonographer:    Vikki Ports [place] Referring Phys: [place]  1. Left ventricular ejection fraction, by visual estimation, is 50 to 55%. The left ventricle has low normal function. There is moderately increased left  ventricular hypertrophy.  2. Elevated left ventricular end-diastolic pressure.  3. Left ventricular diastolic parameters are consistent with Grade I diastolic dysfunction (impaired relaxation).  4. The left ventricle has no regional wall motion abnormalities.  5. Global right ventricle has normal systolic function.The right ventricular size is normal. No increase in right ventricular wall thickness.  6. Left atrial size was moderately dilated.  7. Right atrial size was normal.  8. The mitral valve is grossly normal. Mild mitral valve regurgitation.  9. The tricuspid valve is grossly normal. 10. The tricuspid valve is grossly normal. Tricuspid valve regurgitation is mild-moderate. 11. The aortic valve is tricuspid. Aortic valve regurgitation is not visualized.  Very mild aortic valve stenosis. 12. The pulmonic valve was grossly normal. Pulmonic valve regurgitation is not visualized. 13. Moderately elevated pulmonary artery systolic pressure. 14. The inferior vena cava is normal in size with greater than 50% respiratory variability, suggesting right atrial pressure of 3 mmHg. FINDINGS  Left Ventricle: Left ventricular ejection fraction, by visual estimation, is 50 to 55%. The left ventricle has low normal function. The left ventricle has no regional wall motion abnormalities. The left ventricular internal cavity size was the left ventricle is normal in size. There is moderately increased left ventricular hypertrophy. Concentric left ventricular hypertrophy. Left ventricular diastolic parameters are consistent with Grade I diastolic dysfunction (impaired relaxation). Elevated left  ventricular end-diastolic pressure. Right Ventricle: The right ventricular size is normal. No increase in right ventricular wall thickness. Global RV systolic function is has normal systolic function. The tricuspid regurgitant velocity is 3.56 m/s, and with an assumed right atrial pressure  of 3 mmHg, the estimated right ventricular systolic  pressure is moderately elevated at 53.7 mmHg. Left Atrium: Left atrial size was moderately dilated. Right Atrium: Right atrial size was normal in size Pericardium: There is no evidence of pericardial effusion. Mitral Valve: The mitral valve is grossly normal. Mild mitral valve regurgitation. Tricuspid Valve: The tricuspid valve is grossly normal. Tricuspid valve regurgitation is mild-moderate. Aortic Valve: The aortic valve is tricuspid. . There is mild thickening of the aortic valve. Aortic valve regurgitation is not visualized. Mild aortic stenosis is present. Mild aortic valve annular calcification. There is mild thickening of the aortic valve. Aortic valve mean gradient measures 10.8 mmHg. Aortic valve peak gradient measures 17.3 mmHg. Aortic valve area, by VTI measures 2.01 cm. Pulmonic Valve: The pulmonic valve was grossly normal. Pulmonic valve regurgitation is not visualized. Pulmonic regurgitation is not visualized. Aorta: The aortic root is normal in size and structure. Venous: The inferior vena cava is normal in size with greater than 50% respiratory variability, suggesting right atrial pressure of 3 mmHg. IAS/Shunts: No atrial level shunt detected by color flow Doppler.  LEFT VENTRICLE PLAX 2D LVIDd:         5.65 cm  Diastology LVIDs:         4.00 cm  LV e' lateral:   3.71 cm/s LV PW:         1.30 cm  LV E/e' lateral: 26.1 LV IVS:        1.25 cm  LV e' medial:    3.85 cm/s LVOT diam:     2.00 cm  LV E/e' medial:  25.1 LV SV:         87 ml LV SV Index:   40.79 LVOT Area:     3.14 cm  RIGHT VENTRICLE RV S prime:     14.70 cm/s TAPSE (M-mode): 2.8 cm LEFT ATRIUM             Index       RIGHT ATRIUM           Index LA diam:        5.40 cm 2.54 cm/m  RA Area:     23.60 cm LA Vol (A2C):   82.1 ml 38.55 ml/m RA Volume:   73.90 ml  34.70 ml/m LA Vol (A4C):   87.2 ml 40.94 ml/m LA Biplane Vol: 85.0 ml 39.91 ml/m  AORTIC VALVE AV Area (Vmax):    2.02 cm AV Area (Vmean):   1.97 cm AV Area (VTI):     2.01  cm AV Vmax:  208.00 cm/s AV Vmean:          154.500 cm/s AV VTI:            0.448 m AV Peak Grad:      17.3 mmHg AV Mean Grad:      10.8 mmHg LVOT Vmax:         134.00 cm/s LVOT Vmean:        96.800 cm/s LVOT VTI:          0.287 m LVOT/AV VTI ratio: 0.64  AORTA Ao Root diam: 3.40 cm MITRAL VALVE                         TRICUSPID VALVE MV Area (PHT): 3.31 cm              TR Peak grad:   50.7 mmHg MV PHT:        66.41 msec            TR Vmax:        356.00 cm/s MV Decel Time: 229 msec MV E velocity: 96.80 cm/s  103 cm/s  SHUNTS MV A velocity: 128.00 cm/s 70.3 cm/s Systemic VTI:  0.29 m MV E/A ratio:  0.76        1.5       Systemic Diam: 2.00 cm  Kate Sable MD Electronically signed by Kate Sable MD Signature Date/Time: 02/18/2019/10:33:13 AM    Final    VAS [place] LOWER EXTREMITY VENOUS (DVT)  Result Date: 02/18/2019  Lower Venous DVTStudy Indications: Edema.  Performing Technologist: Antonieta Pert RDMS, RVT  Examination Guidelines: A complete evaluation includes B-mode imaging, spectral Doppler, color Doppler, and power Doppler as needed of all accessible portions of each vessel. Bilateral testing is considered an integral part of a complete examination. Limited examinations for reoccurring indications may be performed as noted. The reflux portion of the exam is performed with the patient in reverse Trendelenburg.  +-----+---------------+---------+-----------+----------+--------------+ RIGHTCompressibilityPhasicitySpontaneityPropertiesThrombus Aging +-----+---------------+---------+-----------+----------+--------------+ CFV  Full           Yes      Yes                                 +-----+---------------+---------+-----------+----------+--------------+   +---------+---------------+---------+-----------+----------+--------------+ LEFT     CompressibilityPhasicitySpontaneityPropertiesThrombus Aging +---------+---------------+---------+-----------+----------+--------------+  CFV      Full           Yes      Yes                                 +---------+---------------+---------+-----------+----------+--------------+ SFJ      Full                                                        +---------+---------------+---------+-----------+----------+--------------+ FV Prox  Full                                                        +---------+---------------+---------+-----------+----------+--------------+ FV Mid   Full  duplicated     +---------+---------------+---------+-----------+----------+--------------+ FV DistalFull                                                        +---------+---------------+---------+-----------+----------+--------------+ PFV      Full                                                        +---------+---------------+---------+-----------+----------+--------------+ POP      Full           Yes      Yes                                 +---------+---------------+---------+-----------+----------+--------------+ PTV      Full                                                        +---------+---------------+---------+-----------+----------+--------------+ PERO     Full                                                        +---------+---------------+---------+-----------+----------+--------------+ GSV      Full                                                        +---------+---------------+---------+-----------+----------+--------------+     Summary: RIGHT: - No evidence of deep vein thrombosis in the lower extremity. No indirect evidence of obstruction proximal to the inguinal ligament.  LEFT: - There is no evidence of deep vein thrombosis in the lower extremity.  - No cystic structure found in the popliteal fossa.  *See table(s) above for measurements and observations. Electronically signed by Ruta Hinds MD on 02/18/2019 at 11:29:29 AM.    Final      Microbiology: No results found for this or any previous visit (from the past 240 hour(s)).   Labs: Basic Metabolic Panel: No results for input(s): NA, K, CL, CO2, GLUCOSE, BUN, CREATININE, CALCIUM, MG, PHOS in the last 168 hours. Liver Function Tests: No results for input(s): AST, ALT, ALKPHOS, BILITOT, PROT, ALBUMIN in the last 168 hours. No results for input(s): LIPASE, AMYLASE in the last 168 hours. No results for input(s): AMMONIA in the last 168 hours. CBC: No results for input(s): WBC, NEUTROABS, HGB, HCT, MCV, PLT in the last 168 hours. Cardiac Enzymes: No results for input(s): CKTOTAL, CKMB, CKMBINDEX, TROPONINI in the last 168 hours. BNP: BNP (last 3 results) No results for input(s): BNP in the last 8760 hours.  ProBNP (last 3 results) No results for input(s): PROBNP in the last 8760 hours.  CBG: No results for input(s): GLUCAP in  the last 168 hours.     Signed:  Domenic Polite MD.  [facility] 02/28/2019, 3:37 PM

## 2019-03-01 DIAGNOSIS — Z992 Dependence on renal dialysis: Secondary | ICD-10-CM | POA: Diagnosis not present

## 2019-03-01 DIAGNOSIS — N186 End stage renal disease: Secondary | ICD-10-CM | POA: Diagnosis not present

## 2019-03-01 DIAGNOSIS — N2581 Secondary hyperparathyroidism of renal origin: Secondary | ICD-10-CM | POA: Diagnosis not present

## 2019-03-01 DIAGNOSIS — D631 Anemia in chronic kidney disease: Secondary | ICD-10-CM | POA: Diagnosis not present

## 2019-03-01 DIAGNOSIS — D509 Iron deficiency anemia, unspecified: Secondary | ICD-10-CM | POA: Diagnosis not present

## 2019-03-03 DIAGNOSIS — D631 Anemia in chronic kidney disease: Secondary | ICD-10-CM | POA: Diagnosis not present

## 2019-03-03 DIAGNOSIS — D509 Iron deficiency anemia, unspecified: Secondary | ICD-10-CM | POA: Diagnosis not present

## 2019-03-03 DIAGNOSIS — N2581 Secondary hyperparathyroidism of renal origin: Secondary | ICD-10-CM | POA: Diagnosis not present

## 2019-03-03 DIAGNOSIS — N186 End stage renal disease: Secondary | ICD-10-CM | POA: Diagnosis not present

## 2019-03-03 DIAGNOSIS — Z992 Dependence on renal dialysis: Secondary | ICD-10-CM | POA: Diagnosis not present

## 2019-03-07 ENCOUNTER — Ambulatory Visit: Payer: Self-pay | Admitting: Family Medicine

## 2019-03-08 DIAGNOSIS — D631 Anemia in chronic kidney disease: Secondary | ICD-10-CM | POA: Diagnosis not present

## 2019-03-08 DIAGNOSIS — Z992 Dependence on renal dialysis: Secondary | ICD-10-CM | POA: Diagnosis not present

## 2019-03-08 DIAGNOSIS — N2581 Secondary hyperparathyroidism of renal origin: Secondary | ICD-10-CM | POA: Diagnosis not present

## 2019-03-08 DIAGNOSIS — N186 End stage renal disease: Secondary | ICD-10-CM | POA: Diagnosis not present

## 2019-03-08 DIAGNOSIS — D509 Iron deficiency anemia, unspecified: Secondary | ICD-10-CM | POA: Diagnosis not present

## 2019-03-09 ENCOUNTER — Telehealth: Payer: Self-pay | Admitting: Family Medicine

## 2019-03-09 NOTE — Telephone Encounter (Signed)
Pt was called and reminded of there appointment 

## 2019-03-10 DIAGNOSIS — N186 End stage renal disease: Secondary | ICD-10-CM | POA: Diagnosis not present

## 2019-03-10 DIAGNOSIS — N2581 Secondary hyperparathyroidism of renal origin: Secondary | ICD-10-CM | POA: Diagnosis not present

## 2019-03-10 DIAGNOSIS — Z992 Dependence on renal dialysis: Secondary | ICD-10-CM | POA: Diagnosis not present

## 2019-03-10 DIAGNOSIS — D631 Anemia in chronic kidney disease: Secondary | ICD-10-CM | POA: Diagnosis not present

## 2019-03-10 DIAGNOSIS — D509 Iron deficiency anemia, unspecified: Secondary | ICD-10-CM | POA: Diagnosis not present

## 2019-03-12 ENCOUNTER — Ambulatory Visit: Payer: Self-pay | Admitting: Family Medicine

## 2019-03-12 DIAGNOSIS — I120 Hypertensive chronic kidney disease with stage 5 chronic kidney disease or end stage renal disease: Secondary | ICD-10-CM | POA: Diagnosis not present

## 2019-03-12 DIAGNOSIS — N186 End stage renal disease: Secondary | ICD-10-CM | POA: Diagnosis not present

## 2019-03-12 DIAGNOSIS — Z992 Dependence on renal dialysis: Secondary | ICD-10-CM | POA: Diagnosis not present

## 2019-03-13 DIAGNOSIS — N186 End stage renal disease: Secondary | ICD-10-CM | POA: Diagnosis not present

## 2019-03-13 DIAGNOSIS — D631 Anemia in chronic kidney disease: Secondary | ICD-10-CM | POA: Diagnosis not present

## 2019-03-13 DIAGNOSIS — Z23 Encounter for immunization: Secondary | ICD-10-CM | POA: Diagnosis not present

## 2019-03-13 DIAGNOSIS — Z992 Dependence on renal dialysis: Secondary | ICD-10-CM | POA: Diagnosis not present

## 2019-03-13 DIAGNOSIS — N2581 Secondary hyperparathyroidism of renal origin: Secondary | ICD-10-CM | POA: Diagnosis not present

## 2019-03-13 DIAGNOSIS — D509 Iron deficiency anemia, unspecified: Secondary | ICD-10-CM | POA: Diagnosis not present

## 2019-03-16 ENCOUNTER — Telehealth: Payer: Self-pay | Admitting: Family Medicine

## 2019-03-16 NOTE — Telephone Encounter (Signed)
Pt called and reminded of appointment 

## 2019-03-17 DIAGNOSIS — D509 Iron deficiency anemia, unspecified: Secondary | ICD-10-CM | POA: Diagnosis not present

## 2019-03-17 DIAGNOSIS — N2581 Secondary hyperparathyroidism of renal origin: Secondary | ICD-10-CM | POA: Diagnosis not present

## 2019-03-17 DIAGNOSIS — D631 Anemia in chronic kidney disease: Secondary | ICD-10-CM | POA: Diagnosis not present

## 2019-03-17 DIAGNOSIS — Z992 Dependence on renal dialysis: Secondary | ICD-10-CM | POA: Diagnosis not present

## 2019-03-17 DIAGNOSIS — N186 End stage renal disease: Secondary | ICD-10-CM | POA: Diagnosis not present

## 2019-03-19 ENCOUNTER — Ambulatory Visit (INDEPENDENT_AMBULATORY_CARE_PROVIDER_SITE_OTHER): Payer: Medicare Other | Admitting: Family Medicine

## 2019-03-19 ENCOUNTER — Encounter: Payer: Self-pay | Admitting: Family Medicine

## 2019-03-19 ENCOUNTER — Other Ambulatory Visit: Payer: Self-pay

## 2019-03-19 VITALS — BP 137/83 | HR 66 | Temp 98.3°F | Ht 74.0 in | Wt 186.2 lb

## 2019-03-19 DIAGNOSIS — I674 Hypertensive encephalopathy: Secondary | ICD-10-CM | POA: Diagnosis not present

## 2019-03-19 DIAGNOSIS — Z13220 Encounter for screening for lipoid disorders: Secondary | ICD-10-CM | POA: Diagnosis not present

## 2019-03-19 DIAGNOSIS — E538 Deficiency of other specified B group vitamins: Secondary | ICD-10-CM

## 2019-03-19 DIAGNOSIS — N186 End stage renal disease: Secondary | ICD-10-CM | POA: Diagnosis not present

## 2019-03-19 DIAGNOSIS — E559 Vitamin D deficiency, unspecified: Secondary | ICD-10-CM

## 2019-03-19 DIAGNOSIS — Z992 Dependence on renal dialysis: Secondary | ICD-10-CM | POA: Diagnosis not present

## 2019-03-19 DIAGNOSIS — I10 Essential (primary) hypertension: Secondary | ICD-10-CM

## 2019-03-19 DIAGNOSIS — Z0000 Encounter for general adult medical examination without abnormal findings: Secondary | ICD-10-CM

## 2019-03-19 DIAGNOSIS — Z09 Encounter for follow-up examination after completed treatment for conditions other than malignant neoplasm: Secondary | ICD-10-CM | POA: Diagnosis not present

## 2019-03-19 DIAGNOSIS — Z1329 Encounter for screening for other suspected endocrine disorder: Secondary | ICD-10-CM | POA: Diagnosis not present

## 2019-03-19 LAB — POCT URINALYSIS DIPSTICK
Bilirubin, UA: NEGATIVE
Blood, UA: NEGATIVE
Glucose, UA: NEGATIVE
Ketones, UA: NEGATIVE
Leukocytes, UA: NEGATIVE
Nitrite, UA: NEGATIVE
Protein, UA: POSITIVE — AB
Spec Grav, UA: 1.02 (ref 1.010–1.025)
Urobilinogen, UA: 0.2 E.U./dL
pH, UA: 7.5 (ref 5.0–8.0)

## 2019-03-19 NOTE — Progress Notes (Signed)
Patient [name]   Established Patient Office Visit  Subjective:  Patient ID: Jason Clarin., male    DOB: 04-12-1960  Age: 59 y.o. MRN: [id number]  CC:  Chief Complaint  Patient presents with  . Follow-up    HTN  . Hospitalization Follow-up    02/17/2019 ED Hypertensive -    HPI Jason [name]. is a 59 year old male who presents for Hospital Follow Up today.   Past Medical History:  Diagnosis Date  . A-V fistula (HCC)    Right arm  . Abdominal hernia   . AKI (acute kidney injury) ([place]) 01/2015  . Anasarca   . Anemia   . Asthma    as a child  . Bilateral inguinal hernia   . Bilateral pleural effusion 01/2017  . Cellulitis 01/16/2017   Bilateral lower extremity  . CHF (congestive heart failure) ([place])   . Chronic venous insufficiency   . Concentric left ventricular hypertrophy 08/17/2017   Moderated, noted on ECHO  . Diastolic dysfunction 81/85/6314   noted on ECHO  . Elevated LFTs    per patient, resolved  . End stage renal disease [facility])    Dialysis T/Th/Sa  Started 02/2017/pt is waiting for a kidney transplant  . H/O right inguinal hernia repair 11/2017  . History of colonoscopy 03/20/2018  . History of elevated PSA 10/2017  . Hypertension   . Pneumonia   . Pulmonary hypertension (HCC)    Moderate  . Venous stasis ulcer ([place])    bil legs/ healed up now per pt (03/06/18)    Current Status: Since his last office visit, he has been admitted to [facility] on 02/17/2019- 02/19/2019, as he was transferred from the dialysis center with blood pressure complications. Today he is doing well with no complaints. He is now currently on Hydralazine 3 times a day. He is currently receiving dialysis treatments on Tuesdays, Thursday, and Saturdays. He is also has to schedule appointment with Cardiologist.  He denies visual changes, chest pain, cough, shortness of breath, heart palpitations, and falls. He has  occasional headaches and dizziness with position changes. Denies severe headaches, confusion, seizures, double vision, and blurred vision, nausea and vomiting. He denies fevers, chills, fatigue, recent infections, weight loss, and night sweats. No reports of GI problems such as diarrhea, and constipation. He has no reports of blood in stools, dysuria and hematuria. No depression or anxiety reported today. He denies suicidal ideations, homicidal ideations, or auditory hallucinations. He denies pain today.    Past Surgical History:  Procedure Laterality Date  . AV FISTULA PLACEMENT Right 01/20/2017   Procedure: ARTERIOVENOUS (AV) FISTULA CREATION;  Surgeon: Angelia Mould, MD;  Location: [facility] [place];  Service: Vascular;  Laterality: Right;  . HERNIA REPAIR    . INSERTION OF DIALYSIS CATHETER Right 01/20/2017   Procedure: INSERTION OF DIALYSIS CATHETER;  Surgeon: Angelia Mould, MD;  Location: [place];  Service: Vascular;  Laterality: Right;  . INSERTION OF MESH N/A 11/18/2017   Procedure: INSERTION OF MESH;  Surgeon: Michael Boston, MD;  [facility];  Service: General;  Laterality: N/A;  . IR FLUORO GUIDE CV LINE RIGHT  01/18/2017  . IR US GUIDE VASC ACCESS RIGHT  01/18/2017  . LAPAROSCOPIC INGUINAL HERNIA WITH UMBILICAL HERNIA Bilateral 11/18/2017   Procedure: LAPAROSCOPIC BILATERAL INGUINAL HERNIA REPAIR WITH UMBILICAL HERNIA REPAIR WITH MESH;  Surgeon: Michael Boston, MD;  [facility];  Service: General;  Laterality: Bilateral;  . TOE SURGERY     right fracture in big toe  . TONSILLECTOMY      Family History  Problem Relation Age of Onset  . Heart attack Mother 84       CABG hx  . Heart disease Mother   . Hypertension Mother   . Stroke Mother   . Diabetes Father   . Healthy Brother   . Prostate cancer Maternal Grandfather   . Colon cancer Neg Hx   . Colon polyps Neg Hx   . Esophageal cancer Neg Hx   . Stomach cancer Neg Hx   . Rectal cancer Neg Hx     Social History    Socioeconomic History  . Marital status: Single    Spouse name: Not on file  . Number of children: Not on file  . Years of education: Not on file  . Highest education level: Not on file  Occupational History  . Not on file  Tobacco Use  . Smoking status: Former Smoker    Years: 17.00    Quit date: 1997    Years since quitting: 24.2  . Smokeless tobacco: Never Used  . Tobacco comment: quit smoking in 1997  Substance and Sexual Activity  . Alcohol use: Not Currently    Comment: every several months, rare  . Drug use: Not Currently    Comment: marijuana  . Sexual activity: Not Currently  Other Topics Concern  . Not on file  Social History Narrative  . Not on file   Social Determinants of Health   Financial Resource Strain:   . Difficulty of Paying Living Expenses:   Food Insecurity:   . Worried About Charity fundraiser in the Last Year:   . Arboriculturist in the Last Year:   Transportation Needs:   . Film/video editor (Medical):   Marland Kitchen Lack of Transportation (Non-Medical):   Physical Activity:   . Days of Exercise per Week:   . Minutes of Exercise per Session:   Stress:   . Feeling of Stress :   Social Connections:   . Frequency of Communication with Friends and Family:   . Frequency of Social Gatherings with Friends and Family:   . Attends Religious Services:   . Active Member of Clubs or Organizations:   . Attends Archivist Meetings:   Marland Kitchen Marital Status:   Intimate Partner Violence:   . Fear of Current or Ex-Partner:   . Emotionally Abused:   Marland Kitchen Physically Abused:   . Sexually Abused:     Outpatient Medications Prior to Visit  Medication Sig Dispense Refill  . amLODipine (NORVASC) 10 MG tablet Take 10 mg by mouth every evening.    . carvedilol (COREG) 25 MG tablet Take 1 tablet (25 mg total) by mouth 2 (two) times daily with a meal. 60 tablet 2  . cholecalciferol (VITAMIN D3) 25 MCG (1000 UNIT) tablet Take 1,000 Units by mouth daily.    Marland Kitchen  doxercalciferol (HECTOROL) 0.5 MCG capsule Doxercalciferol (Hectorol)    . hydrALAZINE (APRESOLINE) 25 MG tablet Take 1 tablet (25 mg total) by mouth every 8 (eight) hours. 90 tablet 1  . multivitamin (RENA-VIT) TABS tablet Take 1 tablet by mouth daily.  11  . sevelamer carbonate (RENVELA) 2.4 g PACK Take 2.4 g by mouth 3 (three) times daily with meals.    . hydrALAZINE (APRESOLINE) 50 MG tablet Take by mouth.     No facility-administered medications prior to  visit.    Allergies  Allergen Reactions  . Lisinopril Cough    ROS Review of Systems  Constitutional: Negative.   HENT: Negative.   Eyes: Negative.   Respiratory: Negative.   Cardiovascular: Negative.   Gastrointestinal: Negative.   Endocrine: Negative.   Genitourinary: Negative.   Musculoskeletal: Negative.   Skin: Negative.   Allergic/Immunologic: Negative.   Neurological: Positive for dizziness (occasional ) and headaches (occasional ).  Hematological: Negative.   Psychiatric/Behavioral: Negative.       Objective:    Physical Exam  Constitutional: He is oriented to person, place, and time. He appears well-developed and well-nourished.  HENT:  Head: Normocephalic and atraumatic.  Eyes: Conjunctivae are normal.  Cardiovascular: Normal rate, regular rhythm, normal heart sounds and intact distal pulses.  Pulmonary/Chest: Effort normal and breath sounds normal.  Abdominal: Soft. Bowel sounds are normal.  Musculoskeletal:        General: Normal range of motion.     Cervical back: Normal range of motion and neck supple.  Neurological: He is alert and oriented to person, place, and time. He has normal reflexes.  Skin: Skin is warm and dry.     Psychiatric: He has a normal mood and affect. His behavior is normal. Judgment and thought content normal.  Nursing note and vitals reviewed.   BP 137/83   Pulse 66   Temp 98.3 F (36.8 C) (Oral)   Ht 6\' 2"  (1.88 m)   Wt 186 lb 3.2 oz (84.5 kg)   SpO2 96%   BMI 23.91  kg/m  Wt Readings from Last 3 Encounters:  03/19/19 186 lb 3.2 oz (84.5 kg)  02/19/19 181 lb 6.4 oz (82.3 kg)  02/03/19 185 lb (83.9 kg)     There are no preventive care reminders to display for this patient.  There are no preventive care reminders to display for this patient.  Lab Results  Component Value Date   TSH 1.950 03/19/2019   Lab Results  Component Value Date   WBC 4.0 03/19/2019   HGB 9.4 (L) 03/19/2019   HCT 29.6 (L) 03/19/2019   MCV 90 03/19/2019   PLT 227 03/19/2019   Lab Results  Component Value Date   NA 136 02/18/2019   K 4.9 02/18/2019   CO2 25 02/18/2019   GLUCOSE 129 (H) 02/18/2019   BUN 62 (H) 02/18/2019   CREATININE 10.42 (H) 02/18/2019   BILITOT 1.1 02/18/2019   ALKPHOS 35 (L) 02/18/2019   AST 19 02/18/2019   ALT 30 02/18/2019   PROT 6.1 (L) 02/18/2019   ALBUMIN 3.2 (L) 02/18/2019   CALCIUM 8.8 (L) 02/18/2019   ANIONGAP 11 02/18/2019   Lab Results  Component Value Date   CHOL 123 03/19/2019   Lab Results  Component Value Date   HDL 49 03/19/2019   Lab Results  Component Value Date   LDLCALC 61 03/19/2019   Lab Results  Component Value Date   TRIG 61 03/19/2019   Lab Results  Component Value Date   CHOLHDL 2.5 03/19/2019   Lab Results  Component Value Date   HGBA1C 4.9 01/17/2017      Assessment & Plan:   1. Hospital discharge follow-up  2. Hypertensive encephalopathy Resolved. - CBC with Differential  3. Essential hypertension The current medical regimen is effective; blood pressure is stable at 137/83 today; continue present plan and medications as prescribed. He will continue to take medications as prescribed, to decrease high sodium intake, excessive alcohol intake, increase potassium intake, smoking  cessation, and increase physical activity of at least 30 minutes of cardio activity daily. He will continue to follow Heart Healthy or DASH diet.  4. ESRD (end stage renal disease) ([place])  5. Hemodialysis patient  ([place])  6. Vitamin D deficiency - Vitamin D, 25-hydroxy  7. Vitamin B12 deficiency - Vitamin B12  8. Healthcare maintenance - POCT urinalysis dipstick - Comprehensive metabolic panel; Future - TSH - Lipid Panel  9. Follow up He will follow up in 6 months.   No orders of the defined types were placed in this encounter.   Orders Placed This Encounter  Procedures  . Comprehensive metabolic panel  . CBC with Differential  . TSH  . Lipid Panel  . Vitamin B12  . Vitamin D, 25-hydroxy  . POCT urinalysis dipstick    Referral Orders  No referral(s) requested today    Kathe Becton,  MSN, FNP-BC [facility] [address] [place], [place] [address] [phone]   Problem List Items Addressed This Visit      Cardiovascular and Mediastinum   Essential hypertension     Genitourinary   ESRD (end stage renal disease) ([place])     Other   Hemodialysis patient ([place])   Vitamin D deficiency   Relevant Orders   Vitamin D, 25-hydroxy (Completed)    Other Visit Diagnoses    Hospital discharge follow-up    -  Primary   Hypertensive encephalopathy       Relevant Orders   CBC with Differential (Completed)   Vitamin B12 deficiency       Relevant Orders   Vitamin B12 (Completed)   Healthcare maintenance       Relevant Orders   POCT urinalysis dipstick (Completed)   Comprehensive metabolic panel   TSH (Completed)   Lipid Panel (Completed)   Follow up          No orders of the defined types were placed in this encounter.   Follow-up: Return in about 6 months (around 09/19/2019).    [name], FNP

## 2019-03-20 DIAGNOSIS — D509 Iron deficiency anemia, unspecified: Secondary | ICD-10-CM | POA: Diagnosis not present

## 2019-03-20 DIAGNOSIS — N2581 Secondary hyperparathyroidism of renal origin: Secondary | ICD-10-CM | POA: Diagnosis not present

## 2019-03-20 DIAGNOSIS — Z992 Dependence on renal dialysis: Secondary | ICD-10-CM | POA: Diagnosis not present

## 2019-03-20 DIAGNOSIS — N186 End stage renal disease: Secondary | ICD-10-CM | POA: Diagnosis not present

## 2019-03-20 DIAGNOSIS — D631 Anemia in chronic kidney disease: Secondary | ICD-10-CM | POA: Diagnosis not present

## 2019-03-20 LAB — CBC WITH DIFFERENTIAL/PLATELET
Basophils Absolute: 0.1 10*3/uL (ref 0.0–0.2)
Basos: 1 %
EOS (ABSOLUTE): 0.6 10*3/uL — ABNORMAL HIGH (ref 0.0–0.4)
Eos: 14 %
Hematocrit: 29.6 % — ABNORMAL LOW (ref 37.5–51.0)
Hemoglobin: 9.4 g/dL — ABNORMAL LOW (ref 13.0–17.7)
Immature Grans (Abs): 0 10*3/uL (ref 0.0–0.1)
Immature Granulocytes: 0 %
Lymphocytes Absolute: 0.6 10*3/uL — ABNORMAL LOW (ref 0.7–3.1)
Lymphs: 16 %
MCH: 28.7 pg (ref 26.6–33.0)
MCHC: 31.8 g/dL (ref 31.5–35.7)
MCV: 90 fL (ref 79–97)
Monocytes Absolute: 0.4 10*3/uL (ref 0.1–0.9)
Monocytes: 10 %
Neutrophils Absolute: 2.3 10*3/uL (ref 1.4–7.0)
Neutrophils: 59 %
Platelets: 227 10*3/uL (ref 150–450)
RBC: 3.28 x10E6/uL — ABNORMAL LOW (ref 4.14–5.80)
RDW: 12.5 % (ref 11.6–15.4)
WBC: 4 10*3/uL (ref 3.4–10.8)

## 2019-03-20 LAB — TSH: TSH: 1.95 u[IU]/mL (ref 0.450–4.500)

## 2019-03-20 LAB — LIPID PANEL
Chol/HDL Ratio: 2.5 ratio (ref 0.0–5.0)
Cholesterol, Total: 123 mg/dL (ref 100–199)
HDL: 49 mg/dL (ref >39)
LDL Chol Calc (NIH): 61 mg/dL (ref 0–99)
Triglycerides: 61 mg/dL (ref 0–149)
VLDL Cholesterol Cal: 13 mg/dL (ref 5–40)

## 2019-03-20 LAB — VITAMIN B12: Vitamin B-12: 450 pg/mL (ref 232–1245)

## 2019-03-20 LAB — VITAMIN D 25 HYDROXY (VIT D DEFICIENCY, FRACTURES): Vit D, 25-Hydroxy: 48.8 ng/mL (ref 30.0–100.0)

## 2019-03-22 DIAGNOSIS — Z992 Dependence on renal dialysis: Secondary | ICD-10-CM | POA: Diagnosis not present

## 2019-03-22 DIAGNOSIS — D509 Iron deficiency anemia, unspecified: Secondary | ICD-10-CM | POA: Diagnosis not present

## 2019-03-22 DIAGNOSIS — N2581 Secondary hyperparathyroidism of renal origin: Secondary | ICD-10-CM | POA: Diagnosis not present

## 2019-03-22 DIAGNOSIS — N186 End stage renal disease: Secondary | ICD-10-CM | POA: Diagnosis not present

## 2019-03-22 DIAGNOSIS — D631 Anemia in chronic kidney disease: Secondary | ICD-10-CM | POA: Diagnosis not present

## 2019-03-24 DIAGNOSIS — N2581 Secondary hyperparathyroidism of renal origin: Secondary | ICD-10-CM | POA: Diagnosis not present

## 2019-03-24 DIAGNOSIS — Z992 Dependence on renal dialysis: Secondary | ICD-10-CM | POA: Diagnosis not present

## 2019-03-24 DIAGNOSIS — D631 Anemia in chronic kidney disease: Secondary | ICD-10-CM | POA: Diagnosis not present

## 2019-03-24 DIAGNOSIS — N186 End stage renal disease: Secondary | ICD-10-CM | POA: Diagnosis not present

## 2019-03-24 DIAGNOSIS — D509 Iron deficiency anemia, unspecified: Secondary | ICD-10-CM | POA: Diagnosis not present

## 2019-03-29 DIAGNOSIS — N2581 Secondary hyperparathyroidism of renal origin: Secondary | ICD-10-CM | POA: Diagnosis not present

## 2019-03-29 DIAGNOSIS — D509 Iron deficiency anemia, unspecified: Secondary | ICD-10-CM | POA: Diagnosis not present

## 2019-03-29 DIAGNOSIS — D631 Anemia in chronic kidney disease: Secondary | ICD-10-CM | POA: Diagnosis not present

## 2019-03-29 DIAGNOSIS — N186 End stage renal disease: Secondary | ICD-10-CM | POA: Diagnosis not present

## 2019-03-29 DIAGNOSIS — Z992 Dependence on renal dialysis: Secondary | ICD-10-CM | POA: Diagnosis not present

## 2019-03-31 DIAGNOSIS — N186 End stage renal disease: Secondary | ICD-10-CM | POA: Diagnosis not present

## 2019-03-31 DIAGNOSIS — D509 Iron deficiency anemia, unspecified: Secondary | ICD-10-CM | POA: Diagnosis not present

## 2019-03-31 DIAGNOSIS — D631 Anemia in chronic kidney disease: Secondary | ICD-10-CM | POA: Diagnosis not present

## 2019-03-31 DIAGNOSIS — N2581 Secondary hyperparathyroidism of renal origin: Secondary | ICD-10-CM | POA: Diagnosis not present

## 2019-03-31 DIAGNOSIS — Z992 Dependence on renal dialysis: Secondary | ICD-10-CM | POA: Diagnosis not present

## 2019-04-03 DIAGNOSIS — D509 Iron deficiency anemia, unspecified: Secondary | ICD-10-CM | POA: Diagnosis not present

## 2019-04-03 DIAGNOSIS — N2581 Secondary hyperparathyroidism of renal origin: Secondary | ICD-10-CM | POA: Diagnosis not present

## 2019-04-03 DIAGNOSIS — D631 Anemia in chronic kidney disease: Secondary | ICD-10-CM | POA: Diagnosis not present

## 2019-04-03 DIAGNOSIS — N186 End stage renal disease: Secondary | ICD-10-CM | POA: Diagnosis not present

## 2019-04-03 DIAGNOSIS — Z992 Dependence on renal dialysis: Secondary | ICD-10-CM | POA: Diagnosis not present

## 2019-04-05 DIAGNOSIS — D509 Iron deficiency anemia, unspecified: Secondary | ICD-10-CM | POA: Diagnosis not present

## 2019-04-05 DIAGNOSIS — Z992 Dependence on renal dialysis: Secondary | ICD-10-CM | POA: Diagnosis not present

## 2019-04-05 DIAGNOSIS — N186 End stage renal disease: Secondary | ICD-10-CM | POA: Diagnosis not present

## 2019-04-05 DIAGNOSIS — N2581 Secondary hyperparathyroidism of renal origin: Secondary | ICD-10-CM | POA: Diagnosis not present

## 2019-04-05 DIAGNOSIS — D631 Anemia in chronic kidney disease: Secondary | ICD-10-CM | POA: Diagnosis not present

## 2019-04-07 DIAGNOSIS — N2581 Secondary hyperparathyroidism of renal origin: Secondary | ICD-10-CM | POA: Diagnosis not present

## 2019-04-07 DIAGNOSIS — D631 Anemia in chronic kidney disease: Secondary | ICD-10-CM | POA: Diagnosis not present

## 2019-04-07 DIAGNOSIS — D509 Iron deficiency anemia, unspecified: Secondary | ICD-10-CM | POA: Diagnosis not present

## 2019-04-07 DIAGNOSIS — N186 End stage renal disease: Secondary | ICD-10-CM | POA: Diagnosis not present

## 2019-04-07 DIAGNOSIS — Z992 Dependence on renal dialysis: Secondary | ICD-10-CM | POA: Diagnosis not present

## 2019-04-12 DIAGNOSIS — N2581 Secondary hyperparathyroidism of renal origin: Secondary | ICD-10-CM | POA: Diagnosis not present

## 2019-04-12 DIAGNOSIS — D509 Iron deficiency anemia, unspecified: Secondary | ICD-10-CM | POA: Diagnosis not present

## 2019-04-12 DIAGNOSIS — Z992 Dependence on renal dialysis: Secondary | ICD-10-CM | POA: Diagnosis not present

## 2019-04-12 DIAGNOSIS — D631 Anemia in chronic kidney disease: Secondary | ICD-10-CM | POA: Diagnosis not present

## 2019-04-12 DIAGNOSIS — I129 Hypertensive chronic kidney disease with stage 1 through stage 4 chronic kidney disease, or unspecified chronic kidney disease: Secondary | ICD-10-CM | POA: Diagnosis not present

## 2019-04-12 DIAGNOSIS — N186 End stage renal disease: Secondary | ICD-10-CM | POA: Diagnosis not present

## 2019-04-14 DIAGNOSIS — N186 End stage renal disease: Secondary | ICD-10-CM | POA: Diagnosis not present

## 2019-04-14 DIAGNOSIS — D631 Anemia in chronic kidney disease: Secondary | ICD-10-CM | POA: Diagnosis not present

## 2019-04-14 DIAGNOSIS — D509 Iron deficiency anemia, unspecified: Secondary | ICD-10-CM | POA: Diagnosis not present

## 2019-04-14 DIAGNOSIS — Z992 Dependence on renal dialysis: Secondary | ICD-10-CM | POA: Diagnosis not present

## 2019-04-14 DIAGNOSIS — N2581 Secondary hyperparathyroidism of renal origin: Secondary | ICD-10-CM | POA: Diagnosis not present

## 2019-04-26 ENCOUNTER — Inpatient Hospital Stay (HOSPITAL_COMMUNITY)
Admission: EM | Admit: 2019-04-26 | Discharge: 2019-04-30 | DRG: 304 | Disposition: A | Discharge status: Home / Self Care | Payer: Medicare Other | Attending: Internal Medicine | Admitting: Internal Medicine

## 2019-04-26 ENCOUNTER — Other Ambulatory Visit: Payer: Self-pay

## 2019-04-26 ENCOUNTER — Encounter (HOSPITAL_COMMUNITY): Payer: Self-pay

## 2019-04-26 ENCOUNTER — Emergency Department (HOSPITAL_COMMUNITY): Payer: Medicare Other

## 2019-04-26 DIAGNOSIS — I5043 Acute on chronic combined systolic (congestive) and diastolic (congestive) heart failure: Secondary | ICD-10-CM | POA: Diagnosis present

## 2019-04-26 DIAGNOSIS — Z8701 Personal history of pneumonia (recurrent): Secondary | ICD-10-CM

## 2019-04-26 DIAGNOSIS — I161 Hypertensive emergency: Secondary | ICD-10-CM | POA: Diagnosis not present

## 2019-04-26 DIAGNOSIS — D631 Anemia in chronic kidney disease: Secondary | ICD-10-CM | POA: Diagnosis not present

## 2019-04-26 DIAGNOSIS — Z992 Dependence on renal dialysis: Secondary | ICD-10-CM | POA: Diagnosis not present

## 2019-04-26 DIAGNOSIS — Z8249 Family history of ischemic heart disease and other diseases of the circulatory system: Secondary | ICD-10-CM | POA: Diagnosis not present

## 2019-04-26 DIAGNOSIS — Z79899 Other long term (current) drug therapy: Secondary | ICD-10-CM | POA: Diagnosis not present

## 2019-04-26 DIAGNOSIS — D638 Anemia in other chronic diseases classified elsewhere: Secondary | ICD-10-CM | POA: Diagnosis present

## 2019-04-26 DIAGNOSIS — I132 Hypertensive heart and chronic kidney disease with heart failure and with stage 5 chronic kidney disease, or end stage renal disease: Secondary | ICD-10-CM | POA: Diagnosis present

## 2019-04-26 DIAGNOSIS — Z9114 Patient's other noncompliance with medication regimen: Secondary | ICD-10-CM | POA: Diagnosis not present

## 2019-04-26 DIAGNOSIS — Z20822 Contact with and (suspected) exposure to covid-19: Secondary | ICD-10-CM | POA: Diagnosis present

## 2019-04-26 DIAGNOSIS — E875 Hyperkalemia: Secondary | ICD-10-CM | POA: Diagnosis present

## 2019-04-26 DIAGNOSIS — I5032 Chronic diastolic (congestive) heart failure: Secondary | ICD-10-CM | POA: Diagnosis present

## 2019-04-26 DIAGNOSIS — I160 Hypertensive urgency: Secondary | ICD-10-CM | POA: Diagnosis present

## 2019-04-26 DIAGNOSIS — Z87891 Personal history of nicotine dependence: Secondary | ICD-10-CM | POA: Diagnosis not present

## 2019-04-26 DIAGNOSIS — I120 Hypertensive chronic kidney disease with stage 5 chronic kidney disease or end stage renal disease: Secondary | ICD-10-CM | POA: Diagnosis not present

## 2019-04-26 DIAGNOSIS — N2581 Secondary hyperparathyroidism of renal origin: Secondary | ICD-10-CM | POA: Diagnosis present

## 2019-04-26 DIAGNOSIS — R392 Extrarenal uremia: Secondary | ICD-10-CM | POA: Diagnosis not present

## 2019-04-26 DIAGNOSIS — I272 Pulmonary hypertension, unspecified: Secondary | ICD-10-CM | POA: Diagnosis present

## 2019-04-26 DIAGNOSIS — E8770 Fluid overload, unspecified: Secondary | ICD-10-CM | POA: Diagnosis not present

## 2019-04-26 DIAGNOSIS — N250 Renal osteodystrophy: Secondary | ICD-10-CM | POA: Diagnosis not present

## 2019-04-26 DIAGNOSIS — I5042 Chronic combined systolic (congestive) and diastolic (congestive) heart failure: Secondary | ICD-10-CM | POA: Diagnosis not present

## 2019-04-26 DIAGNOSIS — N186 End stage renal disease: Secondary | ICD-10-CM | POA: Diagnosis present

## 2019-04-26 DIAGNOSIS — Z888 Allergy status to other drugs, medicaments and biological substances status: Secondary | ICD-10-CM

## 2019-04-26 DIAGNOSIS — Z9115 Patient's noncompliance with renal dialysis: Secondary | ICD-10-CM

## 2019-04-26 DIAGNOSIS — Z9111 Patient's noncompliance with dietary regimen: Secondary | ICD-10-CM

## 2019-04-26 DIAGNOSIS — I119 Hypertensive heart disease without heart failure: Secondary | ICD-10-CM | POA: Diagnosis not present

## 2019-04-26 LAB — POCT I-STAT EG7
Acid-base deficit: 7 mmol/L — ABNORMAL HIGH (ref 0.0–2.0)
Bicarbonate: 19.2 mmol/L — ABNORMAL LOW (ref 20.0–28.0)
Calcium, Ion: 1.1 mmol/L — ABNORMAL LOW (ref 1.15–1.40)
HCT: 26 % — ABNORMAL LOW (ref 39.0–52.0)
Hemoglobin: 8.8 g/dL — ABNORMAL LOW (ref 13.0–17.0)
O2 Saturation: 98 %
Potassium: 5.2 mmol/L — ABNORMAL HIGH (ref 3.5–5.1)
Sodium: 136 mmol/L (ref 135–145)
TCO2: 20 mmol/L — ABNORMAL LOW (ref 22–32)
pCO2, Ven: 40.6 mmHg — ABNORMAL LOW (ref 44.0–60.0)
pH, Ven: 7.282 (ref 7.250–7.430)
pO2, Ven: 121 mmHg — ABNORMAL HIGH (ref 32.0–45.0)

## 2019-04-26 LAB — CBC WITH DIFFERENTIAL/PLATELET
Abs Immature Granulocytes: 0.04 10*3/uL (ref 0.00–0.07)
Basophils Absolute: 0 10*3/uL (ref 0.0–0.1)
Basophils Relative: 1 %
Eosinophils Absolute: 0.2 10*3/uL (ref 0.0–0.5)
Eosinophils Relative: 5 %
HCT: 26.3 % — ABNORMAL LOW (ref 39.0–52.0)
Hemoglobin: 8.8 g/dL — ABNORMAL LOW (ref 13.0–17.0)
Immature Granulocytes: 1 %
Lymphocytes Relative: 10 %
Lymphs Abs: 0.4 10*3/uL — ABNORMAL LOW (ref 0.7–4.0)
MCH: 30.2 pg (ref 26.0–34.0)
MCHC: 33.5 g/dL (ref 30.0–36.0)
MCV: 90.4 fL (ref 80.0–100.0)
Monocytes Absolute: 0.2 10*3/uL (ref 0.1–1.0)
Monocytes Relative: 5 %
Neutro Abs: 3.2 10*3/uL (ref 1.7–7.7)
Neutrophils Relative %: 78 %
Platelets: 180 10*3/uL (ref 150–400)
RBC: 2.91 MIL/uL — ABNORMAL LOW (ref 4.22–5.81)
RDW: 12.8 % (ref 11.5–15.5)
WBC: 4.1 10*3/uL (ref 4.0–10.5)
nRBC: 0 % (ref 0.0–0.2)

## 2019-04-26 LAB — CBC
HCT: 24.8 % — ABNORMAL LOW (ref 39.0–52.0)
Hemoglobin: 8 g/dL — ABNORMAL LOW (ref 13.0–17.0)
MCH: 29.9 pg (ref 26.0–34.0)
MCHC: 32.3 g/dL (ref 30.0–36.0)
MCV: 92.5 fL (ref 80.0–100.0)
Platelets: 145 10*3/uL — ABNORMAL LOW (ref 150–400)
RBC: 2.68 MIL/uL — ABNORMAL LOW (ref 4.22–5.81)
RDW: 12.9 % (ref 11.5–15.5)
WBC: 3.5 10*3/uL — ABNORMAL LOW (ref 4.0–10.5)
nRBC: 0 % (ref 0.0–0.2)

## 2019-04-26 LAB — RESPIRATORY PANEL BY RT PCR (FLU A&B, COVID)
Influenza A by PCR: NEGATIVE
Influenza B by PCR: NEGATIVE
SARS Coronavirus 2 by RT PCR: NEGATIVE

## 2019-04-26 LAB — IRON AND TIBC
Iron: 60 ug/dL (ref 45–182)
Saturation Ratios: 27 % (ref 17.9–39.5)
TIBC: 223 ug/dL — ABNORMAL LOW (ref 250–450)
UIBC: 163 ug/dL

## 2019-04-26 LAB — TSH: TSH: 2.089 u[IU]/mL (ref 0.350–4.500)

## 2019-04-26 LAB — FERRITIN: Ferritin: 692 ng/mL — ABNORMAL HIGH (ref 24–336)

## 2019-04-26 LAB — CREATININE, SERUM
Creatinine, Ser: 24.75 mg/dL — ABNORMAL HIGH (ref 0.61–1.24)
GFR calc Af Amer: 2 mL/min — ABNORMAL LOW (ref >60)
GFR calc non Af Amer: 2 mL/min — ABNORMAL LOW (ref >60)

## 2019-04-26 LAB — MAGNESIUM: Magnesium: 3.2 mg/dL — ABNORMAL HIGH (ref 1.7–2.4)

## 2019-04-26 IMAGING — DX DG CHEST 1V PORT
1 series · 1 of 1 positions shown · non-contrast
Comparison: [DATE]

CLINICAL DATA: Hypertension and missed dialysis.

EXAM:
PORTABLE CHEST 1 VIEW

[chest ap]
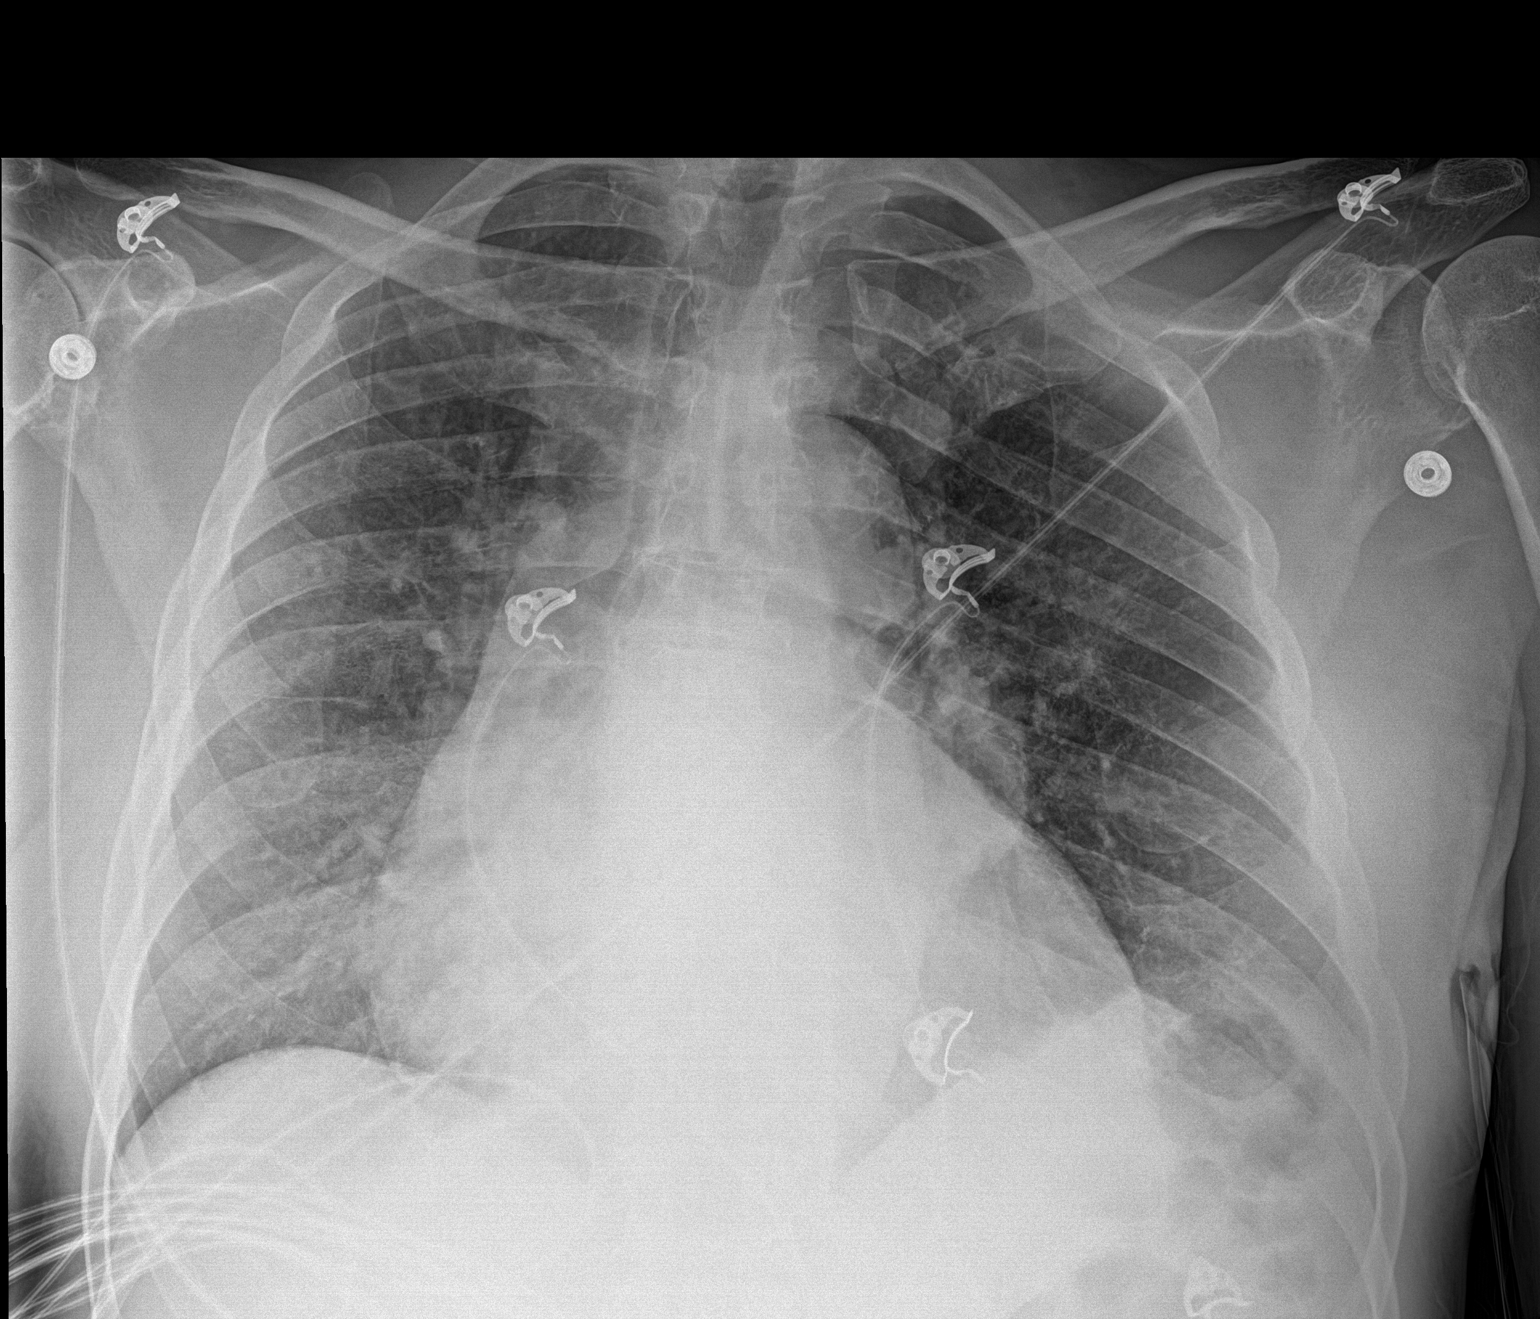

[1 of 1 positions shown; findings below may reference images not displayed]

FINDINGS: Patient is slightly rotated to the left. Lungs are adequately
inflated demonstrate mild hazy prominence of the perihilar vessels
suggesting minimal vascular congestion. Mild stable cardiomegaly. No
effusion. Remainder the exam is unchanged.
IMPRESSION: Mild cardiomegaly with suggestion of minimal vascular congestion.

## 2019-04-26 MED ORDER — LIDOCAINE-PRILOCAINE 2.5-2.5 % EX CREA
1.0000 "application " | TOPICAL_CREAM | CUTANEOUS | Status: DC | PRN
  Filled 2019-04-26: qty 5

## 2019-04-26 MED ORDER — ONDANSETRON HCL 4 MG PO TABS: 4.0000 mg | ORAL_TABLET | Freq: Four times a day (QID) | ORAL | Status: DC | PRN

## 2019-04-26 MED ORDER — LABETALOL HCL 5 MG/ML IV SOLN
20.0000 mg | INTRAVENOUS | Status: DC | PRN
  Administered 2019-04-26 (×3): 20 mg via INTRAVENOUS
  Filled 2019-04-26 (×3): qty 4

## 2019-04-26 MED ORDER — HEPARIN SODIUM (PORCINE) 1000 UNIT/ML DIALYSIS
1000.0000 [IU] | INTRAMUSCULAR | Status: DC | PRN
  Filled 2019-04-26: qty 1

## 2019-04-26 MED ORDER — HEPARIN SODIUM (PORCINE) 5000 UNIT/ML IJ SOLN
5000.0000 [IU] | Freq: Three times a day (TID) | INTRAMUSCULAR | Status: DC
  Administered 2019-04-26 – 2019-04-29 (×11): 5000 [IU] via SUBCUTANEOUS
  Filled 2019-04-26 (×12): qty 1

## 2019-04-26 MED ORDER — SODIUM CHLORIDE 0.9 % IV SOLN: 100.0000 mL | INTRAVENOUS | Status: DC | PRN

## 2019-04-26 MED ORDER — HYDRALAZINE HCL 25 MG PO TABS: 25.0000 mg | ORAL_TABLET | Freq: Three times a day (TID) | ORAL | Status: DC

## 2019-04-26 MED ORDER — ACETAMINOPHEN 650 MG RE SUPP: 650.0000 mg | Freq: Four times a day (QID) | RECTAL | Status: DC | PRN

## 2019-04-26 MED ORDER — CHLORHEXIDINE GLUCONATE CLOTH 2 % EX PADS
6.0000 | MEDICATED_PAD | Freq: Every day | CUTANEOUS | Status: DC
  Administered 2019-04-28 – 2019-04-30 (×3): 6 via TOPICAL

## 2019-04-26 MED ORDER — LIDOCAINE HCL (PF) 1 % IJ SOLN: 5.0000 mL | INTRAMUSCULAR | Status: DC | PRN

## 2019-04-26 MED ORDER — RENA-VITE PO TABS
1.0000 | ORAL_TABLET | Freq: Every day | ORAL | Status: DC
  Administered 2019-04-26 – 2019-04-30 (×5): 1 via ORAL
  Filled 2019-04-26 (×6): qty 1

## 2019-04-26 MED ORDER — LABETALOL HCL 5 MG/ML IV SOLN
0.5000 mg/min | INTRAVENOUS | Status: DC
  Filled 2019-04-26: qty 100

## 2019-04-26 MED ORDER — SEVELAMER CARBONATE 2.4 G PO PACK
2.4000 g | PACK | Freq: Three times a day (TID) | ORAL | Status: DC
  Administered 2019-04-26 – 2019-04-30 (×10): 2.4 g via ORAL
  Filled 2019-04-26 (×13): qty 1

## 2019-04-26 MED ORDER — LABETALOL HCL 5 MG/ML IV SOLN
0.5000 mg/min | INTRAVENOUS | Status: DC
  Administered 2019-04-27: 2 mg/min via INTRAVENOUS
  Filled 2019-04-26 (×4): qty 200

## 2019-04-26 MED ORDER — AMLODIPINE BESYLATE 5 MG PO TABS: 10.0000 mg | ORAL_TABLET | Freq: Every evening | ORAL | Status: DC

## 2019-04-26 MED ORDER — ALTEPLASE 2 MG IJ SOLR: 2.0000 mg | Freq: Once | INTRAMUSCULAR | Status: DC | PRN

## 2019-04-26 MED ORDER — ONDANSETRON HCL 4 MG/2ML IJ SOLN
4.0000 mg | Freq: Four times a day (QID) | INTRAMUSCULAR | Status: DC | PRN
  Filled 2019-04-26: qty 2

## 2019-04-26 MED ORDER — ACETAMINOPHEN 325 MG PO TABS: 650.0000 mg | ORAL_TABLET | Freq: Four times a day (QID) | ORAL | Status: DC | PRN

## 2019-04-26 MED ORDER — PENTAFLUOROPROP-TETRAFLUOROETH EX AERO
1.0000 "application " | INHALATION_SPRAY | CUTANEOUS | Status: DC | PRN
  Filled 2019-04-26: qty 116

## 2019-04-26 MED ORDER — HYDRALAZINE HCL 25 MG PO TABS
25.0000 mg | ORAL_TABLET | Freq: Three times a day (TID) | ORAL | Status: DC
  Administered 2019-04-26 – 2019-04-30 (×10): 25 mg via ORAL
  Filled 2019-04-26 (×12): qty 1

## 2019-04-26 MED ORDER — LABETALOL HCL 5 MG/ML IV SOLN
0.5000 mg/min | INTRAVENOUS | Status: DC
  Administered 2019-04-26: 3 mg/min via INTRAVENOUS
  Administered 2019-04-26: 0.5 mg/min via INTRAVENOUS
  Administered 2019-04-26: 3 mg/min via INTRAVENOUS
  Filled 2019-04-26 (×6): qty 100

## 2019-04-26 NOTE — Consult Note (Signed)
Referring Provider: No ref. provider found Primary Care Physician:  [name], FNP Primary Nephrologist:     Reason for Consultation: Medical management end-stage renal disease: Maintenance euvolemia, evaluation treatment secondary hyperparathyroidism, evaluation and treatment of anemia.  HPI: This is a 59 year old gentleman who is noncompliant end-stage renal disease dialysis.  His last dialysis treatment was 04/13/2019.  He usually dialyzes at [facility] kidney center.  He presents to the emergency room with ongoing headaches.  Of note he was hospitalized for hypertensive urgency February 2021.  It is suspected that he has chronic medical noncompliance.  Blood pressure in the emergency room was 280/140.  Home medications amlodipine 10 mg daily Coreg 25 mg daily hydralazine 25 mg every 8 hours multivitamins 1 daily Renvela 2.4 g 3 times daily with meals  Blood pressure 200 2617 pulse 71 temperature 98.3 O2 sats 97% 2 L nasal cannula  ABG pH 7.28 PO2 121 PCO2 40 sodium 136 potassium 5.2 hemoglobin 8.8  Chest x-ray mild cardiomegaly minimal vascular congestion    Past Medical History:  Diagnosis Date  . A-V fistula (HCC)    Right arm  . Abdominal hernia   . AKI (acute kidney injury) ([place]) 01/2015  . Anasarca   . Anemia   . Asthma    as a child  . Bilateral inguinal hernia   . Bilateral pleural effusion 01/2017  . Cellulitis 01/16/2017   Bilateral lower extremity  . CHF (congestive heart failure) ([place])   . Chronic venous insufficiency   . Concentric left ventricular hypertrophy 08/17/2017   Moderated, noted on ECHO  . Diastolic dysfunction 67/61/9509   noted on ECHO  . Elevated LFTs    per patient, resolved  . End stage renal disease [facility])    Dialysis T/Th/Sa  Started 02/2017/pt is waiting for a kidney transplant  . H/O right inguinal hernia repair 11/2017  . History of colonoscopy 03/20/2018  . History of elevated PSA 10/2017  . Hypertension   . Pneumonia   .  Pulmonary hypertension (HCC)    Moderate  . Venous stasis ulcer ([place])    bil legs/ healed up now per pt (03/06/18)    Past Surgical History:  Procedure Laterality Date  . AV FISTULA PLACEMENT Right 01/20/2017   Procedure: ARTERIOVENOUS (AV) FISTULA CREATION;  Surgeon: Angelia Mould, MD;  Location: [place];  Service: Vascular;  Laterality: Right;  . HERNIA REPAIR    . INSERTION OF DIALYSIS CATHETER Right 01/20/2017   Procedure: INSERTION OF DIALYSIS CATHETER;  Surgeon: Angelia Mould, MD;  Location: [place];  Service: Vascular;  Laterality: Right;  . INSERTION OF MESH N/A 11/18/2017   Procedure: INSERTION OF MESH;  Surgeon: Michael Boston, MD;  [facility];  Service: General;  Laterality: N/A;  . IR FLUORO GUIDE CV LINE RIGHT  01/18/2017  . IR US GUIDE VASC ACCESS RIGHT  01/18/2017  . LAPAROSCOPIC INGUINAL HERNIA WITH UMBILICAL HERNIA Bilateral 11/18/2017   Procedure: LAPAROSCOPIC BILATERAL INGUINAL HERNIA REPAIR WITH UMBILICAL HERNIA REPAIR WITH MESH;  Surgeon: Michael Boston, MD;  [facility];  Service: General;  Laterality: Bilateral;  . TOE SURGERY     right fracture in big toe  . TONSILLECTOMY      Prior to Admission medications   Medication Sig Start Date End Date Taking? Authorizing Provider  amLODipine (NORVASC) 10 MG tablet Take 10 mg by mouth every evening. 06/20/18  Yes [provider]  carvedilol (COREG) 25 MG tablet Take 1 tablet (25 mg total)  by mouth 2 (two) times daily with a meal. 02/02/18  Yes [name], FNP  hydrALAZINE (APRESOLINE) 25 MG tablet Take 1 tablet (25 mg total) by mouth every 8 (eight) hours. 02/19/19  Yes Domenic Polite, MD  multivitamin (RENA-VIT) TABS tablet Take 1 tablet by mouth daily. 10/20/17  Yes [provider]  sevelamer carbonate (RENVELA) 2.4 g PACK Take 2.4 g by mouth 3 (three) times daily with meals.   Yes [provider]    Current Facility-Administered Medications  Medication Dose Route  Frequency Provider Last Rate Last Admin  . labetalol (NORMODYNE) 500 mg in dextrose 5 % 125 mL (4 mg/mL) infusion  0.5-3 mg/min Intravenous Titrated Daleen Bo, MD      . labetalol (NORMODYNE) injection 20 mg  20 mg Intravenous Q10 min PRN Daleen Bo, MD   20 mg at 04/26/19 1500   Current Outpatient Medications  Medication Sig Dispense Refill  . amLODipine (NORVASC) 10 MG tablet Take 10 mg by mouth every evening.    . carvedilol (COREG) 25 MG tablet Take 1 tablet (25 mg total) by mouth 2 (two) times daily with a meal. 60 tablet 2  . hydrALAZINE (APRESOLINE) 25 MG tablet Take 1 tablet (25 mg total) by mouth every 8 (eight) hours. 90 tablet 1  . multivitamin (RENA-VIT) TABS tablet Take 1 tablet by mouth daily.  11  . sevelamer carbonate (RENVELA) 2.4 g PACK Take 2.4 g by mouth 3 (three) times daily with meals.      Allergies as of 04/26/2019 - Review Complete 04/26/2019  Allergen Reaction Noted  . Lisinopril Cough 01/29/2015    Family History  Problem Relation Age of Onset  . Heart attack Mother 53       CABG hx  . Heart disease Mother   . Hypertension Mother   . Stroke Mother   . Diabetes Father   . Healthy Brother   . Prostate cancer Maternal Grandfather   . Colon cancer Neg Hx   . Colon polyps Neg Hx   . Esophageal cancer Neg Hx   . Stomach cancer Neg Hx   . Rectal cancer Neg Hx     Social History   Socioeconomic History  . Marital status: Single    Spouse name: Not on file  . Number of children: Not on file  . Years of education: Not on file  . Highest education level: Not on file  Occupational History  . Not on file  Tobacco Use  . Smoking status: Former Smoker    Years: 17.00    Quit date: 1997    Years since quitting: 24.3  . Smokeless tobacco: Never Used  . Tobacco comment: quit smoking in 1997  Substance and Sexual Activity  . Alcohol use: Not Currently    Comment: every several months, rare  . Drug use: Not Currently    Comment: marijuana  .  Sexual activity: Not Currently  Other Topics Concern  . Not on file  Social History Narrative  . Not on file   Social Determinants of Health   Financial Resource Strain:   . Difficulty of Paying Living Expenses:   Food Insecurity:   . Worried About Charity fundraiser in the Last Year:   . Arboriculturist in the Last Year:   Transportation Needs:   . Film/video editor (Medical):   Marland Kitchen Lack of Transportation (Non-Medical):   Physical Activity:   . Days of Exercise per Week:   .  Minutes of Exercise per Session:   Stress:   . Feeling of Stress :   Social Connections:   . Frequency of Communication with Friends and Family:   . Frequency of Social Gatherings with Friends and Family:   . Attends Religious Services:   . Active Member of Clubs or Organizations:   . Attends Archivist Meetings:   Marland Kitchen Marital Status:   Intimate Partner Violence:   . Fear of Current or Ex-Partner:   . Emotionally Abused:   Marland Kitchen Physically Abused:   . Sexually Abused:     Review of Systems: Gen: Denies any fever, chills, sweats, admits to fatigue weakness malaise HEENT: No visual complaints, No history of Retinopathy. Normal external appearance No Epistaxis or Sore throat. No sinusitis.   CV: Denies chest pain, angina, palpitations, syncope, orthopnea, PND, peripheral edema, and claudication. Resp: Denies dyspnea at rest, dyspnea with exercise, cough, sputum, wheezing, coughing up blood, and pleurisy. GI: Denies vomiting blood, jaundice, and fecal incontinence.   Denies dysphagia or odynophagia. GU : Denies urinary burning, blood in urine, urinary frequency, urinary hesitancy, nocturnal urination, and urinary incontinence.  No renal calculi. MS: Denies joint pain, limitation of movement, and swelling, stiffness, low back pain, extremity pain. Denies muscle weakness, cramps, atrophy.  No use of non steroidal antiinflammatory drugs. Derm: Denies rash, itching, dry skin, hives, moles, warts, or  unhealing ulcers.  Psych: Denies depression, anxiety, memory loss, suicidal ideation, hallucinations, paranoia, and confusion. Heme: Denies bruising, bleeding, and enlarged lymph nodes. Neuro: Complains of headache no diplopia dysarthria dysphagia no history of CVA seizures Endocrine No DM.  No Thyroid disease.  No Adrenal disease.  Physical Exam: Vital signs in last 24 hours: Temp:  [98.3 F (36.8 C)] 98.3 F (36.8 C) (04/15 1216) Pulse Rate:  [69-86] 71 (04/15 1500) Resp:  [14-25] 19 (04/15 1515) BP: (191-230)/(100-130) 226/117 (04/15 1515) SpO2:  [94 %-100 %] 100 % (04/15 1500) Weight:  [81.6 kg] 81.6 kg (04/15 1217)   General:   Alert,  Well-developed, nondistressed Head:  Normocephalic and atraumatic. Eyes:  Sclera clear, no icterus.   Conjunctiva pink. Ears:  Normal auditory acuity. Nose:  No deformity, discharge,  or lesions. Mouth:  No deformity or lesions, dentition normal. Neck:  Supple; no masses or thyromegaly. JVP not elevated Lungs: Diminished air entry poor respiratory effort Heart:  Regular rate and rhythm; no murmurs, clicks, rubs,  or gallops. Abdomen:  Soft, nontender and nondistended. No masses, hepatosplenomegaly or hernias noted. Normal bowel sounds, without guarding, and without rebound.   Msk:  Symmetrical without gross deformities. Normal posture. Pulses:  No carotid, renal, femoral bruits. DP and PT symmetrical and equal Extremities:  Without clubbing or edema.  AV graft right upper arm thrill and bruit Neurologic:  Alert and  oriented x4;  grossly normal neurologically. Skin:  Intact without significant lesions or rashes. Cervical Nodes:  No significant cervical adenopathy. Psych:  Alert and cooperative. Normal mood and affect.  Intake/Output from previous day: No intake/output data recorded. Intake/Output this shift: No intake/output data recorded.  Lab Results: Recent Labs    04/26/19 1227 04/26/19 1414  WBC 4.1  --   HGB 8.8* 8.8*  HCT  26.3* 26.0*  PLT 180  --    BMET Recent Labs    04/26/19 1414  NA 136  K 5.2*   LFT No results for input(s): PROT, ALBUMIN, AST, ALT, ALKPHOS, BILITOT, BILIDIR, IBILI in the last 72 hours. PT/INR No results for input(s): LABPROT, INR  in the last 72 hours. Hepatitis Panel No results for input(s): HEPBSAG, HCVAB, HEPAIGM, HEPBIGM in the last 72 hours.  Studies/Results: DG Chest Port 1 View  Result Date: 04/26/2019 CLINICAL DATA:  Hypertension and missed dialysis. EXAM: PORTABLE CHEST 1 VIEW COMPARISON:  02/17/2019 FINDINGS: Patient is slightly rotated to the left. Lungs are adequately inflated demonstrate mild hazy prominence of the perihilar vessels suggesting minimal vascular congestion. Mild stable cardiomegaly. No effusion. Remainder the exam is unchanged. IMPRESSION: Mild cardiomegaly with suggestion of minimal vascular congestion. Electronically Signed   By: Marin Olp M.D.   On: 04/26/2019 12:51   Dialysis NR 200 a  K2/calcium 3.5   Duration    4 hours 15 minutes   Weight variation greater than 3 L   blood pressure post 200/112 mmHg Missed 6/12  dialysis treatments in the last 30 days    Estimated dry weight 81.5 kg  Hectorol 6 mcg TIW Micera 75 mcg every 2 weeks Iron 50 mg weekly Heparin 6000 units   Assessment/Plan:  ESRD-poor compliance frequent misses to dialysis at least 50% of time.  His last dialysis treatment 04/13/2018.  No urgent need for dialysis this evening we will have to postpone dialysis due to high volumes of dialysis patients during surge.  There are no urgent indications for dialysis at this time.  Concerns regarding disequilibrium.  We will dialyze 04/27/2019  ANEMIA-patient is noncompliant with ESA's.  Has severe hypertension at this time.  We will not schedule any additional ESA dosing  MBD-continue binders we will continue to follow calcium phosphorus.  His last PTH was 468 March 2021.  Phosphorus 7.2.  Receives Hectorol 6 mcg q.  dialysis  HTN/VOL-remarkably does not appear to be significantly volume overloaded despite missing almost 2 weeks of dialysis.  This would suggest considerable residual renal function.  ACCESS-AV graft  Hypertensive urgency.  Patient is chronically hypertensive.  Medical noncompliance suspected.  Would recommend screening for use of recreational drugs.  Resist the temptation to normalize blood pressure.  I would aim for blood pressure control of 180 mmHg - [id number] systolic in next 24 hours patient is chronically hypertensive.  Blood pressure will need to be lowered gently   LOS: 0 Sherril Croon @TODAY @3 :34 PM

## 2019-04-26 NOTE — ED Notes (Signed)
This nurse reached out to dialysis. informed pt is on the schedule for dialysis tomorrow.

## 2019-04-26 NOTE — ED Triage Notes (Signed)
Pt bib ems, reporting that the pt called out for hypertension. 280/140 is the pressure for the pt. Pt missed dialysis for a week. Pt said that he was sick so didn't go. Pt also reports that he's having memory issues.   90hr 22rr Pt desats to 83 on ra. Pt placed on 2L

## 2019-04-26 NOTE — ED Notes (Signed)
Spoke to Dr. Doristine Bosworth. She instructed to titrate down to 2 on labetalol, continue to monitor and give medications already ordered.

## 2019-04-26 NOTE — ED Notes (Signed)
Pt close to goal last 2 BP's 179/94 and 185/94. However HR slightly has trended downward to 49-51 range. Drip has been paused and MD notified.

## 2019-04-26 NOTE — ED Provider Notes (Signed)
[facility] EMERGENCY DEPARTMENT Provider Note   CSN: [id number] Arrival date & time: 04/26/19  1205     History Chief Complaint  Patient presents with  . Hypertension    Jason Smail. is a 59 y.o. male. Patient seen at 12:38 PM. HPI He presents for evaluation of headache ongoing for several days.  He has been dialyzed recently and taking very little of his blood pressure medicine over the last week or so.  He denies nausea, vomiting, chest pain or significant shortness of breath.  Hospitalized for hypertensive urgency, early February 2021.  There are no other known modifying factors    Past Medical History:  Diagnosis Date  . A-V fistula (HCC)    Right arm  . Abdominal hernia   . AKI (acute kidney injury) ([place]) 01/2015  . Anasarca   . Anemia   . Asthma    as a child  . Bilateral inguinal hernia   . Bilateral pleural effusion 01/2017  . Cellulitis 01/16/2017   Bilateral lower extremity  . CHF (congestive heart failure) ([place])   . Chronic venous insufficiency   . Concentric left ventricular hypertrophy 08/17/2017   Moderated, noted on ECHO  . Diastolic dysfunction 38/10/1749   noted on ECHO  . Elevated LFTs    per patient, resolved  . End stage renal disease [facility])    Dialysis T/Th/Sa  Started 02/2017/pt is waiting for a kidney transplant  . H/O right inguinal hernia repair 11/2017  . History of colonoscopy 03/20/2018  . History of elevated PSA 10/2017  . Hypertension   . Pneumonia   . Pulmonary hypertension (HCC)    Moderate  . Venous stasis ulcer ([place])    bil legs/ healed up now per pt (03/06/18)    Patient Active Problem List   Diagnosis Date Noted  . Hypertensive urgency 02/17/2019  . Hypertensive emergency 02/17/2019  . Leg edema, left 02/17/2019  . Bilateral impacted cerumen 08/24/2018  . History of elevated PSA 08/24/2018  . Hemodialysis patient ([place]) 05/28/2018  . Vitamin D deficiency 05/28/2018  . Left inguinal hernia s/p lap  repair w mesh 11/18/2017 11/18/2017  . Scrotal hernia, right, s/p lap repair w mesh 11/18/2017 05/30/2017  . Arteriovenous fistula (Right arm) for dialysis 05/30/2017  . Umbilical hernia s/p primary repair 11/18/2017 05/30/2017  . Anasarca 02/18/2017  . ESRD (end stage renal disease) ([place])   . NSVT (nonsustained ventricular tachycardia) ([place])   . Evaluation by psychiatric service required 01/19/2017  . Elevated troponin 01/16/2017  . Acute CHF (congestive heart failure) ([place]) 01/16/2017  . Venous stasis ulcer ([place]) 01/16/2017  . Essential hypertension 02/26/2015  . Abnormal EKG 01/30/2015  . Anemia 01/29/2015    Past Surgical History:  Procedure Laterality Date  . AV FISTULA PLACEMENT Right 01/20/2017   Procedure: ARTERIOVENOUS (AV) FISTULA CREATION;  Surgeon: Angelia Mould, MD;  Location: [facility];  Service: Vascular;  Laterality: Right;  . HERNIA REPAIR    . INSERTION OF DIALYSIS CATHETER Right 01/20/2017   Procedure: INSERTION OF DIALYSIS CATHETER;  Surgeon: Angelia Mould, MD;  Location: [place] [facility];  Service: Vascular;  Laterality: Right;  . INSERTION OF MESH N/A 11/18/2017   Procedure: INSERTION OF MESH;  Surgeon: Michael Boston, MD;  [facility];  Service: General;  Laterality: N/A;  . IR FLUORO GUIDE CV LINE RIGHT  01/18/2017  . IR US GUIDE VASC ACCESS RIGHT  01/18/2017  . LAPAROSCOPIC INGUINAL HERNIA WITH UMBILICAL HERNIA Bilateral 11/18/2017  Procedure: LAPAROSCOPIC BILATERAL INGUINAL HERNIA REPAIR WITH UMBILICAL HERNIA REPAIR WITH MESH;  Surgeon: Michael Boston, MD;  [facility];  Service: General;  Laterality: Bilateral;  . TOE SURGERY     right fracture in big toe  . TONSILLECTOMY         Family History  Problem Relation Age of Onset  . Heart attack Mother 38       CABG hx  . Heart disease Mother   . Hypertension Mother   . Stroke Mother   . Diabetes Father   . Healthy Brother   . Prostate cancer Maternal Grandfather   . Colon cancer Neg Hx   . Colon  polyps Neg Hx   . Esophageal cancer Neg Hx   . Stomach cancer Neg Hx   . Rectal cancer Neg Hx     Social History   Tobacco Use  . Smoking status: Former Smoker    Years: 17.00    Quit date: 1997    Years since quitting: 24.3  . Smokeless tobacco: Never Used  . Tobacco comment: quit smoking in 1997  Substance Use Topics  . Alcohol use: Not Currently    Comment: every several months, rare  . Drug use: Not Currently    Comment: marijuana    Home Medications Prior to Admission medications   Medication Sig Start Date End Date Taking? Authorizing Provider  amLODipine (NORVASC) 10 MG tablet Take 10 mg by mouth every evening. 06/20/18   [provider]  carvedilol (COREG) 25 MG tablet Take 1 tablet (25 mg total) by mouth 2 (two) times daily with a meal. 02/02/18   [name], FNP  cholecalciferol (VITAMIN D3) 25 MCG (1000 UNIT) tablet Take 1,000 Units by mouth daily.    [provider]  doxercalciferol (HECTOROL) 0.5 MCG capsule Doxercalciferol (Hectorol) 02/22/19 02/21/20  [provider]  hydrALAZINE (APRESOLINE) 25 MG tablet Take 1 tablet (25 mg total) by mouth every 8 (eight) hours. 02/19/19   Domenic Polite, MD  multivitamin (RENA-VIT) TABS tablet Take 1 tablet by mouth daily. 10/20/17   [provider]  sevelamer carbonate (RENVELA) 2.4 g PACK Take 2.4 g by mouth 3 (three) times daily with meals.    [provider]    Allergies    Lisinopril  Review of Systems   Review of Systems  All other systems reviewed and are negative.   Physical Exam Updated Vital Signs BP (!) 221/130   Pulse 86   Temp 98.3 F (36.8 C) (Oral)   Resp 19   Ht 6\' 2"  (1.88 m)   Wt 81.6 kg   SpO2 94%   BMI 23.11 kg/m   Physical Exam Vitals and nursing note reviewed.  Constitutional:      General: He is not in acute distress.    Appearance: He is well-developed. He is not ill-appearing, toxic-appearing or diaphoretic.  HENT:     Head:  Normocephalic and atraumatic.     Right Ear: External ear normal.     Left Ear: External ear normal.  Eyes:     Conjunctiva/sclera: Conjunctivae normal.     Pupils: Pupils are equal, round, and reactive to light.  Neck:     Trachea: Phonation normal.  Cardiovascular:     Rate and Rhythm: Normal rate and regular rhythm.     Heart sounds: Normal heart sounds.     Comments: Normal pulsation, right upper arm vascular fistula. Pulmonary:     Effort: Pulmonary effort is normal.  Breath sounds: Normal breath sounds.  Abdominal:     Palpations: Abdomen is soft.     Tenderness: There is no abdominal tenderness.  Musculoskeletal:        General: Normal range of motion.     Cervical back: Normal range of motion and neck supple.  Skin:    General: Skin is warm and dry.  Neurological:     Mental Status: He is alert and oriented to person, place, and time.     Cranial Nerves: No cranial nerve deficit.     Sensory: No sensory deficit.     Motor: No abnormal muscle tone.     Coordination: Coordination normal.  Psychiatric:        Mood and Affect: Mood normal.        Behavior: Behavior normal.        Thought Content: Thought content normal.        Judgment: Judgment normal.     ED Results / Procedures / Treatments   Labs (all labs ordered are listed, but only abnormal results are displayed) Labs Reviewed - No data to display  EKG None  Radiology No results found.  Procedures .Critical Care Performed by: Daleen Bo, MD Authorized by: Daleen Bo, MD   Critical care provider statement:    Critical care time (minutes):  35   Critical care start time:  04/26/2019 12:38 PM   Critical care time was exclusive of:  Separately billable procedures and treating other patients   Critical care was necessary to treat or prevent imminent or life-threatening deterioration of the following conditions:  Circulatory failure   Critical care was time spent personally by me on the  following activities:  Blood draw for specimens, development of treatment plan with patient or surrogate, discussions with consultants, evaluation of patient's response to treatment, examination of patient, obtaining history from patient or surrogate, ordering and performing treatments and interventions, ordering and review of laboratory studies, pulse oximetry, re-evaluation of patient's condition, review of old charts and ordering and review of radiographic studies   (including critical care time)  Medications Ordered in ED Medications - No data to display  ED Course  I have reviewed the triage vital signs and the nursing notes.  Pertinent labs & imaging results that were available during my care of the patient were reviewed by me and considered in my medical decision making (see chart for details).  Clinical Course as of Apr 25 1516  Thu Apr 26, 2019  1446 Abnormal, CO2 low, PO2 high, bicarb low, total CO2 low, potassium high, calcium low, hematocrit low  POCT I-Stat EG7(!) [EW]  1447 Normal  Respiratory Panel by RT PCR (Flu A&B, Covid) - Nasopharyngeal Swab [EW]  1447 Normal except hemoglobin low  CBC with Differential(!) [EW]  1447 Cardiomegaly, pulmonary vascular congestion, interpreted by me   [EW]  1506 During inadvertent pause and bolusing labetalol, patient's blood pressure again became markedly elevated at 230/130.  Repeat bolus ordered and will initiate treatment with labetalol infusion.  Patient will be admitted for treatment of hypertensive urgency.   [EW]  1510 Case discussed with nephrologist, Dr. Justin Mend who will evaluate the patient as a consultant.  He agrees with hospitalist admission.   [EW]    Clinical Course User Index [EW] Daleen Bo, MD   MDM Rules/Calculators/A&P                       Patient Vitals for the past 24 hrs:  BP  Temp Temp src Pulse Resp SpO2 Height Weight  04/26/19 1515 (!) 226/117 - - - 19 - - -  04/26/19 1500 (!) 224/124 - - 71 (!) 21 100  % - -  04/26/19 1430 (!) 227/124 - - 69 19 95 % - -  04/26/19 1415 (!) 191/100 - - 69 14 96 % - -  04/26/19 1346 (!) 228/124 - - - (!) 22 - - -  04/26/19 1330 (!) 200/108 - - 73 (!) 25 97 % - -  04/26/19 1315 (!) 219/122 - - 73 (!) 23 97 % - -  04/26/19 1230 (!) 230/124 - - 78 (!) 23 94 % - -  04/26/19 1217 - - - - - - 6\' 2"  (1.88 m) 81.6 kg  04/26/19 1216 (!) 221/130 98.3 F (36.8 C) Oral 86 19 94 % - -    3:33 PM Reevaluation with update and discussion. After initial assessment and treatment, an updated evaluation reveals he continues to be fairly comfortable has no further complaints.  He does not use oxygen at home.  Findings discussed with the patient and all questions were answered. Daleen Bo   Medical Decision Making:  This patient is presenting for evaluation of headache, and missing dialysis, which does require a range of treatment options, and is a complaint that involves a high risk of morbidity and mortality. The differential diagnoses include acute renal failure, hypertensive urgency, metabolic instability, hyperkalemia. I decided  to review old records, and in summary patient who frequently does not take medicines and has prior admission for hypertensive urgency, recently. I did not need additional historical information from anyone. Clinical Laboratory Tests Ordered, included CBC, venous blood gas.  Labs are reassuring with exception of mild elevation of potassium.  Notable anemia, which is essentially at baseline. Radiologic Tests Ordered, included chest x-ray. I independently Visualized: Radiographic images, which show pulmonary vascular congestion.; Cardiac Monitor Tracing which shows normal sinus rhythm.   Critical Interventions-blood pressure improves with IV labetalol but rebounded, requiring placement on labetalol drip.  Neurologic status is stable and not concerning for acute intracranial bleeding or encephalopathy.  Mild elevation of potassium, likely related to not  dialyzing.  After These Interventions, the Patient was reevaluated and was found to require hospitalization for treatment of hypertensive urgency, and medication noncompliance.  CRITICAL CARE-yes Performed by: Daleen Bo   Nursing Notes Reviewed/ Care Coordinated Applicable Imaging Reviewed Interpretation of Laboratory Data incorporated into ED treatment   3:27 PM-Consult complete with hospitalist. Patient case explained and discussed.  She agrees to admit patient for further evaluation and treatment. Call ended at 3:25 PM  Final Clinical Impression(s) / ED Diagnoses Final diagnoses:  Hypertensive urgency  Hyperkalemia  End stage renal disease [facility])    Rx / DC Orders ED Discharge Orders    None       Daleen Bo, MD 04/26/19 [phone]

## 2019-04-26 NOTE — H&P (Signed)
History and Physical    Jason [name]. PHX:[id number] DOB: [birth year] DOA: 04/26/2019  PCP: [name], FNP  Patient coming from: Home  I have personally briefly reviewed patient's old medical records in [place]  Chief Complaint: Headache and elevated blood pressure  HPI: Jason [name]. is a 59 y.o. male with medical history significant of hypertension, chronic combined systolic and diastolic congestive heart failure end-stage renal disease on hemodialysis (MWF), anemia of chronic disease, noncompliance with antihypertensive and dialysis appointments presents to emergency department due to headache.  Patient tells me that he has missed about 1 to 2 weeks of dialysis appointment as he was not feeling well.  Reports frontal headache and exertional shortness of breath associated with leg swelling.  Reports cough, congestion, nausea, vomiting and diarrhea since 1 week.  He vomited once and had loose stool this morning. no history of hematemesis, melena, decreased appetite, generalized weakness, loss of consciousness, dizziness, head trauma, seizures, blurry vision, chest pain, urinary symptoms.   Of note: Patient admitted in February 2021 with hypertensive urgency/emergency.  He lives alone.  No history of smoking, alcohol, street drug use.  ED Course: Upon arrival to ED: Patient's blood pressure noted to be in 230/130 CMP shows potassium of 5.2, CBC: H&H of 8.8/26.3.  Chest x-ray shows mild cardiomegaly and minimal vascular congestion.  COVID-19 pending.  EDP consulted nephrology. [facility] hospitalist consulted for admission for hypertensive urgency.  Review of Systems: As per HPI otherwise negative.    Past Medical History:  Diagnosis Date  . A-V fistula (HCC)    Right arm  . Abdominal hernia   . AKI (acute kidney injury) ([place]) 01/2015  . Anasarca   . Anemia   . Asthma    as a child  . Bilateral inguinal hernia   . Bilateral pleural effusion 01/2017  . Cellulitis  01/16/2017   Bilateral lower extremity  . CHF (congestive heart failure) ([place])   . Chronic venous insufficiency   . Concentric left ventricular hypertrophy 08/17/2017   Moderated, noted on ECHO  . Diastolic dysfunction 01/65/5374   noted on ECHO  . Elevated LFTs    per patient, resolved  . End stage renal disease [facility])    Dialysis T/Th/Sa  Started 02/2017/pt is waiting for a kidney transplant  . H/O right inguinal hernia repair 11/2017  . History of colonoscopy 03/20/2018  . History of elevated PSA 10/2017  . Hypertension   . Pneumonia   . Pulmonary hypertension (HCC)    Moderate  . Venous stasis ulcer ([place])    bil legs/ healed up now per pt (03/06/18)    Past Surgical History:  Procedure Laterality Date  . AV FISTULA PLACEMENT Right 01/20/2017   Procedure: ARTERIOVENOUS (AV) FISTULA CREATION;  Surgeon: Angelia Mould, MD;  Location: [place];  Service: Vascular;  Laterality: Right;  . HERNIA REPAIR    . INSERTION OF DIALYSIS CATHETER Right 01/20/2017   Procedure: INSERTION OF DIALYSIS CATHETER;  Surgeon: Angelia Mould, MD;  Location: [place];  Service: Vascular;  Laterality: Right;  . INSERTION OF MESH N/A 11/18/2017   Procedure: INSERTION OF MESH;  Surgeon: Michael Boston, MD;  [facility];  Service: General;  Laterality: N/A;  . IR FLUORO GUIDE CV LINE RIGHT  01/18/2017  . IR US GUIDE VASC ACCESS RIGHT  01/18/2017  . LAPAROSCOPIC INGUINAL HERNIA WITH UMBILICAL HERNIA Bilateral 11/18/2017   Procedure: LAPAROSCOPIC BILATERAL INGUINAL HERNIA REPAIR WITH UMBILICAL HERNIA REPAIR WITH MESH;  Surgeon: Michael Boston, MD;  [facility];  Service: General;  Laterality: Bilateral;  . TOE SURGERY     right fracture in big toe  . TONSILLECTOMY       reports that he quit smoking about 24 years ago. He quit after 17.00 years of use. He has never used smokeless tobacco. He reports previous alcohol use. He reports previous drug use.  Allergies  Allergen Reactions  .  Lisinopril Cough    Family History  Problem Relation Age of Onset  . Heart attack Mother 46       CABG hx  . Heart disease Mother   . Hypertension Mother   . Stroke Mother   . Diabetes Father   . Healthy Brother   . Prostate cancer Maternal Grandfather   . Colon cancer Neg Hx   . Colon polyps Neg Hx   . Esophageal cancer Neg Hx   . Stomach cancer Neg Hx   . Rectal cancer Neg Hx     Prior to Admission medications   Medication Sig Start Date End Date Taking? Authorizing Provider  amLODipine (NORVASC) 10 MG tablet Take 10 mg by mouth every evening. 06/20/18  Yes [provider]  carvedilol (COREG) 25 MG tablet Take 1 tablet (25 mg total) by mouth 2 (two) times daily with a meal. 02/02/18  Yes [name], FNP  hydrALAZINE (APRESOLINE) 25 MG tablet Take 1 tablet (25 mg total) by mouth every 8 (eight) hours. 02/19/19  Yes Domenic Polite, MD  multivitamin (RENA-VIT) TABS tablet Take 1 tablet by mouth daily. 10/20/17  Yes [provider]  sevelamer carbonate (RENVELA) 2.4 g PACK Take 2.4 g by mouth 3 (three) times daily with meals.   Yes [provider]    Physical Exam: Vitals:   04/26/19 1415 04/26/19 1430 04/26/19 1500 04/26/19 1515  BP: (!) 191/100 (!) 227/124 (!) 224/124 (!) 226/117  Pulse: 69 69 71   Resp: 14 19 (!) 21 19  Temp:      TempSrc:      SpO2: 96% 95% 100%   Weight:      Height:        Constitutional: NAD, calm, comfortable, communicating well, on room air Eyes: PERRL, lids and conjunctivae normal ENMT: Mucous membranes are moist. Posterior pharynx clear of any exudate or lesions.Normal dentition.  Neck: normal, supple, no masses, no thyromegaly Respiratory: clear to auscultation bilaterally, no wheezing, no crackles. Normal respiratory effort. No accessory muscle use.  Cardiovascular: Regular rate and rhythm, no murmurs / rubs / gallops.  Bilateral 1+ pitting edema positive. 2+ pedal pulses. No carotid bruits.  Abdomen: no  tenderness, no masses palpated. No hepatosplenomegaly. Bowel sounds positive.  Musculoskeletal: no clubbing / cyanosis. No joint deformity upper and lower extremities. Good ROM, no contractures. Normal muscle tone.  Skin: no rashes, lesions, ulcers. No induration Neurologic: CN 2-12 grossly intact. Sensation intact, DTR normal. Strength 5/5 in all 4.  Psychiatric: Normal judgment and insight. Alert and oriented x 3. Normal mood.    Labs on Admission: I have personally reviewed following labs and imaging studies  CBC: Recent Labs  Lab 04/26/19 1227 04/26/19 1414  WBC 4.1  --   NEUTROABS 3.2  --   HGB 8.8* 8.8*  HCT 26.3* 26.0*  MCV 90.4  --   PLT 180  --    Basic Metabolic Panel: Recent Labs  Lab 04/26/19 1414  NA 136  K 5.2*   GFR: CrCl cannot be calculated (Patient's  most recent lab result is older than the maximum 21 days allowed.). Liver Function Tests: No results for input(s): AST, ALT, ALKPHOS, BILITOT, PROT, ALBUMIN in the last 168 hours. No results for input(s): LIPASE, AMYLASE in the last 168 hours. No results for input(s): AMMONIA in the last 168 hours. Coagulation Profile: No results for input(s): INR, PROTIME in the last 168 hours. Cardiac Enzymes: No results for input(s): CKTOTAL, CKMB, CKMBINDEX, TROPONINI in the last 168 hours. BNP (last 3 results) No results for input(s): PROBNP in the last 8760 hours. HbA1C: No results for input(s): HGBA1C in the last 72 hours. CBG: No results for input(s): GLUCAP in the last 168 hours. Lipid Profile: No results for input(s): CHOL, HDL, LDLCALC, TRIG, CHOLHDL, LDLDIRECT in the last 72 hours. Thyroid Function Tests: No results for input(s): TSH, T4TOTAL, FREET4, T3FREE, THYROIDAB in the last 72 hours. Anemia Panel: No results for input(s): VITAMINB12, FOLATE, FERRITIN, TIBC, IRON, RETICCTPCT in the last 72 hours. Urine analysis:    Component Value Date/Time   COLORURINE YELLOW 01/16/2017 1420   APPEARANCEUR CLEAR  01/16/2017 1420   LABSPEC 1.013 01/16/2017 1420   PHURINE 5.0 01/16/2017 1420   GLUCOSEU NEGATIVE 01/16/2017 1420   HGBUR SMALL (A) 01/16/2017 1420   BILIRUBINUR Negative 03/19/2019 1354   KETONESUR negative 08/23/2018 0802   KETONESUR NEGATIVE 01/16/2017 1420   PROTEINUR Positive (A) 03/19/2019 1354   PROTEINUR 100 (A) 01/16/2017 1420   UROBILINOGEN 0.2 03/19/2019 1354   UROBILINOGEN 0.2 02/26/2015 1028   NITRITE Negative 03/19/2019 1354   NITRITE NEGATIVE 01/16/2017 1420   LEUKOCYTESUR Negative 03/19/2019 1354    Radiological Exams on Admission: DG Chest Port 1 View  Result Date: 04/26/2019 CLINICAL DATA:  Hypertension and missed dialysis. EXAM: PORTABLE CHEST 1 VIEW COMPARISON:  02/17/2019 FINDINGS: Patient is slightly rotated to the left. Lungs are adequately inflated demonstrate mild hazy prominence of the perihilar vessels suggesting minimal vascular congestion. Mild stable cardiomegaly. No effusion. Remainder the exam is unchanged. IMPRESSION: Mild cardiomegaly with suggestion of minimal vascular congestion. Electronically Signed   By: Marin Olp M.D.   On: 04/26/2019 12:51    Assessment/Plan Principal Problem:   Hypertensive urgency Active Problems:   Anemia of chronic disease   ESRD (end stage renal disease) on dialysis (HCC)   Hyperkalemia   Chronic combined systolic (congestive) and diastolic (congestive) heart failure (HCC)   Hypertensive urgency: -Likely secondary to volume overload secondary to noncompliance with dialysis and antihypertensive medication. -Presented with headache, exertional shortness of breath and leg swelling.  He is alert and oriented.  No focal deficit noted on exam. -He is afebrile with no leukocytosis.  COVID-19 pending.  Potassium of 5.2.  Chest x-ray shows: Mild cardiomegaly and minimal vascular congestion. -EDP consulted nephrology for dialysis. -On IV labetalol drip. -Admit patient to stepdown unit.  Continue IV labetalol.  Monitor  vitals closely. -Continue to hold his home meds of hydralazine, Coreg and amlodipine-to avoid rapid correction of blood pressure.  End-stage renal disease on hemodialysis: -Missed last 2 weeks dialysis appointment -Potassium is elevated. -EDP consulted nephrology.  Hyperkalemia: Likely secondary to missed dialysis appointment -We will get a stat EKG. -On telemetry.  Acute on chronic combined systolic and diastolic congestive heart failure: -Reviewed echo from 02/18/2019-showed ejection fraction of 50 to 55% and grade 1 diastolic dysfunction. -Patient has leg swelling as well as vascular congestion on chest x-ray-likely secondary to missed dialysis. -Strict INO's and daily weight. -Monitor signs for fluid overload.  Check electrolytes  Anemia of  chronic disease: -Likely in the setting of ESRD. -H&H today is 8.8/26.3-was 9.4/29.52-month ago.  No history of melena. -Continue to monitor.  Check iron studies.  Transfuse as needed.  DVT prophylaxis: Heparin subcutaneous, SCD/TED  code Status: Full code  family Communication: None present at bedside.  Plan of care discussed with patient in length and he verbalized understanding and agreed with it. Disposition Plan: Likely home in 1 to 2 days Consults called: Nephrology by EDP Admission status: Inpatient   Mckinley Jewel MD [facility] Pager [phone]  If 7PM-7AM, please contact night-coverage [email or web address] Password [facility]  04/26/2019, 3:49 PM

## 2019-04-26 NOTE — ED Notes (Signed)
hospitalist at bedside

## 2019-04-26 NOTE — ED Notes (Signed)
Attending notified of pt bp and labetol at maximum dose. No new orders given. Will coninue to monitor.

## 2019-04-27 LAB — COMPREHENSIVE METABOLIC PANEL
ALT: 32 U/L (ref 0–44)
AST: 14 U/L — ABNORMAL LOW (ref 15–41)
Albumin: 3.7 g/dL (ref 3.5–5.0)
Alkaline Phosphatase: 23 U/L — ABNORMAL LOW (ref 38–126)
Anion gap: 20 — ABNORMAL HIGH (ref 5–15)
BUN: 188 mg/dL — ABNORMAL HIGH (ref 6–20)
CO2: 20 mmol/L — ABNORMAL LOW (ref 22–32)
Calcium: 8.8 mg/dL — ABNORMAL LOW (ref 8.9–10.3)
Chloride: 98 mmol/L (ref 98–111)
Creatinine, Ser: 24.1 mg/dL — ABNORMAL HIGH (ref 0.61–1.24)
GFR calc Af Amer: 2 mL/min — ABNORMAL LOW (ref >60)
GFR calc non Af Amer: 2 mL/min — ABNORMAL LOW (ref >60)
Glucose, Bld: 132 mg/dL — ABNORMAL HIGH (ref 70–99)
Potassium: 5.6 mmol/L — ABNORMAL HIGH (ref 3.5–5.1)
Sodium: 138 mmol/L (ref 135–145)
Total Bilirubin: 1 mg/dL (ref 0.3–1.2)
Total Protein: 6.4 g/dL — ABNORMAL LOW (ref 6.5–8.1)

## 2019-04-27 LAB — CBC
HCT: 24.2 % — ABNORMAL LOW (ref 39.0–52.0)
Hemoglobin: 7.9 g/dL — ABNORMAL LOW (ref 13.0–17.0)
MCH: 29.5 pg (ref 26.0–34.0)
MCHC: 32.6 g/dL (ref 30.0–36.0)
MCV: 90.3 fL (ref 80.0–100.0)
Platelets: 145 10*3/uL — ABNORMAL LOW (ref 150–400)
RBC: 2.68 MIL/uL — ABNORMAL LOW (ref 4.22–5.81)
RDW: 12.7 % (ref 11.5–15.5)
WBC: 3.1 10*3/uL — ABNORMAL LOW (ref 4.0–10.5)
nRBC: 0 % (ref 0.0–0.2)

## 2019-04-27 LAB — HEPATITIS B SURFACE ANTIGEN: Hepatitis B Surface Ag: NONREACTIVE

## 2019-04-27 MED ORDER — CARVEDILOL 25 MG PO TABS
25.0000 mg | ORAL_TABLET | Freq: Two times a day (BID) | ORAL | Status: DC
  Administered 2019-04-27 – 2019-04-30 (×7): 25 mg via ORAL
  Filled 2019-04-27 (×7): qty 1

## 2019-04-27 MED ORDER — HYDRALAZINE HCL 20 MG/ML IJ SOLN
10.0000 mg | INTRAMUSCULAR | Status: DC | PRN
  Administered 2019-04-27 (×2): 10 mg via INTRAVENOUS
  Filled 2019-04-27 (×2): qty 1

## 2019-04-27 MED ORDER — AMLODIPINE BESYLATE 10 MG PO TABS
10.0000 mg | ORAL_TABLET | Freq: Every day | ORAL | Status: DC
  Administered 2019-04-27 – 2019-04-30 (×4): 10 mg via ORAL
  Filled 2019-04-27 (×4): qty 1

## 2019-04-27 NOTE — ED Notes (Signed)
SDU  bfast ordered  

## 2019-04-27 NOTE — ED Notes (Signed)
Pt dropped first hydralazine tablet. RN and pt unable to locate where it fell. 2nd tablet pulled from pyxis and given to pt. RN instructed pt NOT to take 1st pill if found.

## 2019-04-27 NOTE — Procedures (Signed)
I was present at this dialysis session. I have reviewed the session itself and made appropriate changes.   Vital signs in last 24 hours:  Temp:  [97.7 F (36.5 C)-98.3 F (36.8 C)] 97.7 F (36.5 C) (04/16 0434) Pulse Rate:  [48-86] 55 (04/16 0630) Resp:  [14-25] 16 (04/16 0630) BP: (162-230)/(88-130) 204/88 (04/16 0633) SpO2:  [90 %-100 %] 100 % (04/16 0630) Weight:  [81.6 kg] 81.6 kg (04/15 1217) Weight change:  Filed Weights   04/26/19 1217  Weight: 81.6 kg    Recent Labs  Lab 04/27/19 0234  NA 138  K 5.6*  CL 98  CO2 20*  GLUCOSE 132*  BUN 188*  CREATININE 24.10*  CALCIUM 8.8*    Recent Labs  Lab 04/26/19 1227 04/26/19 1227 04/26/19 1414 04/26/19 1850 04/27/19 0234  WBC 4.1  --   --  3.5* 3.1*  NEUTROABS 3.2  --   --   --   --   HGB 8.8*   < > 8.8* 8.0* 7.9*  HCT 26.3*   < > 26.0* 24.8* 24.2*  MCV 90.4  --   --  92.5 90.3  PLT 180  --   --  145* 145*   < > = values in this interval not displayed.    Scheduled Meds: . amLODipine  10 mg Oral Daily  . carvedilol  25 mg Oral BID WC  . Chlorhexidine Gluconate Cloth  6 each Topical Q0600  . heparin  5,000 Units Subcutaneous Q8H  . hydrALAZINE  25 mg Oral Q8H  . multivitamin  1 tablet Oral Daily  . sevelamer carbonate  2.4 g Oral TID WC   Continuous Infusions: . sodium chloride    . sodium chloride     PRN Meds:.sodium chloride, sodium chloride, acetaminophen **OR** acetaminophen, alteplase, heparin, hydrALAZINE, lidocaine (PF), lidocaine-prilocaine, ondansetron **OR** ondansetron (ZOFRAN) IV, pentafluoroprop-tetrafluoroeth     Dialysis NR 200 a  K2/calcium 3.5   Duration    4 hours 15 minutes   Weight variation greater than 3 L   blood pressure post 200/112 mmHg Missed 6/12  dialysis treatments in the last 30 days    Estimated dry weight 81.5 kg  Hectorol 6 mcg TIW Micera 75 mcg every 2 weeks Iron 50 mg weekly Heparin 6000 units   Assessment/Plan: 1. Uremia- due to noncompliance with HD.  Last  HD was 04/14/19.  Will need to lower bfr/dfr due to significantly elevated BUN 2. Hypertensive emergency- due to noncompliance.  Improving with HD, meds and UF 3. Anemia of ESRD- dropped due to missed doses of ESA.  Will dose today 4. Secondary HPTH- poorly controlled due to noncompliance with meds and diet. Resume binders and vitamin D 5. Vascular access- having issues with low bfr will need imaging. 6. Acute on chronic combined systolic and diastolic CHF- UF with HD 7. Medical noncompliance- I had a frank discussion with him regarding the increased risk of sudden death with skipping dialysis.  He denies any suicidal ideation.   Donetta Potts,  MD 04/27/2019, 8:51 AM

## 2019-04-27 NOTE — Plan of Care (Signed)
  Problem: Education: Goal: Knowledge of General Education information will improve Description: Including pain rating scale, medication(s)/side effects and non-pharmacologic comfort measures Outcome: Progressing   Problem: Health Behavior/Discharge Planning: Goal: Ability to manage health-related needs will improve Outcome: Progressing   Problem: Clinical Measurements: Goal: Will remain free from infection Outcome: Progressing Goal: Respiratory complications will improve Outcome: Progressing   Problem: Pain Managment: Goal: General experience of comfort will improve Outcome: Progressing   Problem: Safety: Goal: Ability to remain free from injury will improve Outcome: Progressing

## 2019-04-27 NOTE — Progress Notes (Signed)
PROGRESS NOTE    Jason [name].  PJK:[id number] DOB: May 12, 1960 DOA: 04/26/2019 PCP: [name], FNP    Brief Narrative:  34 with history of hypertension, chronic combined systolic and diastolic congestive heart failure, ESRD on hemodialysis supposed to be Monday Wednesday Friday, last hemodialysis 13 days ago, anemia of chronic disease and noncompliance and missing dialysis appointments presented to the emergency room with headache. In the emergency room, blood pressures 230/130, potassium 5.2.  Patient on room air.  Admitted with dialysis need and hypertensive urgency.   Assessment & Plan:   Principal Problem:   Hypertensive urgency Active Problems:   Anemia of chronic disease   ESRD (end stage renal disease) on dialysis (HCC)   Hyperkalemia   Chronic combined systolic (congestive) and diastolic (congestive) heart failure (HCC)  Hypertensive urgency: Resume home medication including amlodipine, Coreg and as needed hydralazine.  Blood pressures improving with dialysis.  Continue to monitor.  Uptitrate as needed.  ESRD on hemodialysis, missed dialysis for 2 weeks: With greatly elevated urea and creatinine.  Received dialysis today.  Nephrology following.  They continue to follow-up for hemodialysis need.  Hyperkalemia: Due to above.  Received dialysis today.  Chronic combined heart failure: Euvolemic today.  Anemia of chronic disease: Receiving Epogen with dialysis.    DVT prophylaxis: Subcu heparin Code Status: Full code Family Communication: None Disposition Plan: patient is from home. Anticipated DC to home, Barriers to discharge need for subsequent dialysis, very high blood pressures   Consultants:   Nephrology  Procedures:   HD  Antimicrobials:   None   Subjective: Patient seen and examined while receiving dialysis in the morning rounds.  Denies any complaints.  Objective: Vitals:   04/27/19 0630 04/27/19 0633 04/27/19 1142 04/27/19 1423  BP:  (!) 204/88 (!) 204/88 (!) 196/103 (!) 171/88  Pulse: (!) 55  76 70  Resp: 16  20   Temp:   (!) 97.5 F (36.4 C)   TempSrc:   Oral   SpO2: 100%  90%   Weight:      Height:       No intake or output data in the 24 hours ending 04/27/19 1552 Filed Weights   04/26/19 1217  Weight: 81.6 kg    Examination:  General exam: Appears calm and comfortable, chronically sick looking.  Not in any discomfort. Respiratory system: Clear to auscultation. Respiratory effort normal. Cardiovascular system: S1 & S2 heard, RRR.  Gastrointestinal system: Abdomen is nondistended, soft and nontender.  Central nervous system: Alert and oriented. No focal neurological deficits. Extremities: Symmetric 5 x 5 power. Skin: No rashes, lesions or ulcers Psychiatry: Judgement and insight appear normal. Mood & affect appropriate.  Left upper extremity AV fistula receiving dialysis.    Data Reviewed: I have personally reviewed following labs and imaging studies  CBC: Recent Labs  Lab 04/26/19 1227 04/26/19 1414 04/26/19 1850 04/27/19 0234  WBC 4.1  --  3.5* 3.1*  NEUTROABS 3.2  --   --   --   HGB 8.8* 8.8* 8.0* 7.9*  HCT 26.3* 26.0* 24.8* 24.2*  MCV 90.4  --  92.5 90.3  PLT 180  --  145* 809*   Basic Metabolic Panel: Recent Labs  Lab 04/26/19 1414 04/26/19 1850 04/27/19 0234  NA 136  --  138  K 5.2*  --  5.6*  CL  --   --  98  CO2  --   --  20*  GLUCOSE  --   --  132*  BUN  --   --  188*  CREATININE  --  24.75* 24.10*  CALCIUM  --   --  8.8*  MG  --  3.2*  --    GFR: Estimated Creatinine Clearance: 3.9 mL/min (A) (by C-G formula based on SCr of 24.1 mg/dL (H)). Liver Function Tests: Recent Labs  Lab 04/27/19 0234  AST 14*  ALT 32  ALKPHOS 23*  BILITOT 1.0  PROT 6.4*  ALBUMIN 3.7   No results for input(s): LIPASE, AMYLASE in the last 168 hours. No results for input(s): AMMONIA in the last 168 hours. Coagulation Profile: No results for input(s): INR, PROTIME in the last 168  hours. Cardiac Enzymes: No results for input(s): CKTOTAL, CKMB, CKMBINDEX, TROPONINI in the last 168 hours. BNP (last 3 results) No results for input(s): PROBNP in the last 8760 hours. HbA1C: No results for input(s): HGBA1C in the last 72 hours. CBG: No results for input(s): GLUCAP in the last 168 hours. Lipid Profile: No results for input(s): CHOL, HDL, LDLCALC, TRIG, CHOLHDL, LDLDIRECT in the last 72 hours. Thyroid Function Tests: Recent Labs    04/26/19 1850  TSH 2.089   Anemia Panel: Recent Labs    04/26/19 1850  FERRITIN 692*  TIBC 223*  IRON 60   Sepsis Labs: No results for input(s): PROCALCITON, LATICACIDVEN in the last 168 hours.  Recent Results (from the past 240 hour(s))  Respiratory Panel by RT PCR (Flu A&B, Covid) - Nasopharyngeal Swab     Status: None   Collection Time: 04/26/19  1:10 PM   Specimen: Nasopharyngeal Swab  Result Value Ref Range Status   SARS Coronavirus 2 by RT PCR NEGATIVE NEGATIVE Final    Comment: (NOTE) SARS-CoV-2 target nucleic acids are NOT DETECTED. The SARS-CoV-2 RNA is generally detectable in upper respiratoy specimens during the acute phase of infection. The lowest concentration of SARS-CoV-2 viral copies this assay can detect is 131 copies/mL. A negative result does not preclude SARS-Cov-2 infection and should not be used as the sole basis for treatment or other patient management decisions. A negative result may occur with  improper specimen collection/handling, submission of specimen other than nasopharyngeal swab, presence of viral mutation(s) within the areas targeted by this assay, and inadequate number of viral copies (<131 copies/mL). A negative result must be combined with clinical observations, patient history, and epidemiological information. The expected result is Negative. Fact Sheet for Patients:  PinkCheek.be Fact Sheet for Healthcare Providers:    GravelBags.it This test is not yet ap proved or cleared by the [place] FDA and  has been authorized for detection and/or diagnosis of SARS-CoV-2 by FDA under an Emergency Use Authorization (EUA). This EUA will remain  in effect (meaning this test can be used) for the duration of the COVID-19 declaration under Section 564(b)(1) of the Act, 21 U.S.C. section 360bbb-3(b)(1), unless the authorization is terminated or revoked sooner.    Influenza A by PCR NEGATIVE NEGATIVE Final   Influenza B by PCR NEGATIVE NEGATIVE Final    Comment: (NOTE) The Xpert Xpress SARS-CoV-2/FLU/RSV assay is intended as an aid in  the diagnosis of influenza from Nasopharyngeal swab specimens and  should not be used as a sole basis for treatment. Nasal washings and  aspirates are unacceptable for Xpert Xpress SARS-CoV-2/FLU/RSV  testing. Fact Sheet for Patients: PinkCheek.be Fact Sheet for Healthcare Providers: GravelBags.it This test is not yet approved or cleared by the [place] FDA and  has been authorized for detection and/or diagnosis of SARS-CoV-2 by  FDA under an Emergency Use Authorization (EUA). This EUA will remain  in effect (meaning this test can be used) for the duration of the  Covid-19 declaration under Section 564(b)(1) of the Act, 21  U.S.C. section 360bbb-3(b)(1), unless the authorization is  terminated or revoked. Performed at [facility], [place] [address] [place], [place] [address]          Radiology Studies: DG Chest Port 1 View  Result Date: 04/26/2019 CLINICAL DATA:  Hypertension and missed dialysis. EXAM: PORTABLE CHEST 1 VIEW COMPARISON:  02/17/2019 FINDINGS: Patient is slightly rotated to the left. Lungs are adequately inflated demonstrate mild hazy prominence of the perihilar vessels suggesting minimal vascular congestion. Mild stable cardiomegaly. No effusion. Remainder the  exam is unchanged. IMPRESSION: Mild cardiomegaly with suggestion of minimal vascular congestion. Electronically Signed   By: Marin Olp M.D.   On: 04/26/2019 12:51        Scheduled Meds: . amLODipine  10 mg Oral Daily  . carvedilol  25 mg Oral BID WC  . Chlorhexidine Gluconate Cloth  6 each Topical Q0600  . heparin  5,000 Units Subcutaneous Q8H  . hydrALAZINE  25 mg Oral Q8H  . multivitamin  1 tablet Oral Daily  . sevelamer carbonate  2.4 g Oral TID WC   Continuous Infusions: . sodium chloride    . sodium chloride       LOS: 1 day    Time spent: 30 minutes    Barb Merino, MD [facility] Pager [phone]

## 2019-04-28 LAB — DRUG PROFILE, UR, 9 DRUGS (LABCORP)
Amphetamines, Urine: NEGATIVE ng/mL
Barbiturate, Ur: NEGATIVE ng/mL
Benzodiazepine Quant, Ur: NEGATIVE ng/mL
Cannabinoid Quant, Ur: NEGATIVE ng/mL
Cocaine (Metab.): NEGATIVE ng/mL
Methadone Screen, Urine: NEGATIVE ng/mL
Opiate Quant, Ur: NEGATIVE ng/mL
Phencyclidine, Ur: NEGATIVE ng/mL
Propoxyphene, Urine: NEGATIVE ng/mL

## 2019-04-28 NOTE — Progress Notes (Signed)
Pt pulled one of his IVs out and removed telemetry box because they were uncomfortable, he is also refusing to stand for morning weights because he doesn't feel well and does not want nausea medication.

## 2019-04-28 NOTE — Progress Notes (Signed)
Patient ID: Jason [name]., male   DOB: 01-Jun-1960, 59 y.o.   MRN: [id number] S: Complaining of HA and abdominal pain this morning.  Admits to having difficulty concentrating. O:BP (!) 188/104 (BP Location: Left Arm)   Pulse 72   Temp 97.7 F (36.5 C) (Oral)   Resp (!) 22   Ht 6\' 2"  (1.88 m)   Wt 86.9 kg   SpO2 (!) 88%   BMI 24.60 kg/m   Intake/Output Summary (Last 24 hours) at 04/28/2019 1120 Last data filed at 04/28/2019 0600 Gross per 24 hour  Intake 840 ml  Output 0 ml  Net 840 ml   Intake/Output: I/O last 3 completed shifts: In: 840 [P.O.:840] Out: 2000 [Other:2000]  Intake/Output this shift:  No intake/output data recorded. Weight change: 7.053 kg Gen: NAD CVS: no rub Resp: cta Abd: +BS, soft, NT/ND Ext: no edema, RUE AVF +T/B  Recent Labs  Lab 04/26/19 1414 04/26/19 1850 04/27/19 0234  NA 136  --  138  K 5.2*  --  5.6*  CL  --   --  98  CO2  --   --  20*  GLUCOSE  --   --  132*  BUN  --   --  188*  CREATININE  --  24.75* 24.10*  ALBUMIN  --   --  3.7  CALCIUM  --   --  8.8*  AST  --   --  14*  ALT  --   --  32   Liver Function Tests: Recent Labs  Lab 04/27/19 0234  AST 14*  ALT 32  ALKPHOS 23*  BILITOT 1.0  PROT 6.4*  ALBUMIN 3.7   No results for input(s): LIPASE, AMYLASE in the last 168 hours. No results for input(s): AMMONIA in the last 168 hours. CBC: Recent Labs  Lab 04/26/19 1227 04/26/19 1227 04/26/19 1414 04/26/19 1850 04/27/19 0234  WBC 4.1  --   --  3.5* 3.1*  NEUTROABS 3.2  --   --   --   --   HGB 8.8*   < > 8.8* 8.0* 7.9*  HCT 26.3*   < > 26.0* 24.8* 24.2*  MCV 90.4  --   --  92.5 90.3  PLT 180  --   --  145* 145*   < > = values in this interval not displayed.   Cardiac Enzymes: No results for input(s): CKTOTAL, CKMB, CKMBINDEX, TROPONINI in the last 168 hours. CBG: No results for input(s): GLUCAP in the last 168 hours.  Iron Studies:  Recent Labs    04/26/19 1850  IRON 60  TIBC 223*  FERRITIN 692*    Studies/Results: DG Chest Port 1 View  Result Date: 04/26/2019 CLINICAL DATA:  Hypertension and missed dialysis. EXAM: PORTABLE CHEST 1 VIEW COMPARISON:  02/17/2019 FINDINGS: Patient is slightly rotated to the left. Lungs are adequately inflated demonstrate mild hazy prominence of the perihilar vessels suggesting minimal vascular congestion. Mild stable cardiomegaly. No effusion. Remainder the exam is unchanged. IMPRESSION: Mild cardiomegaly with suggestion of minimal vascular congestion. Electronically Signed   By: Marin Olp M.D.   On: 04/26/2019 12:51   . amLODipine  10 mg Oral Daily  . carvedilol  25 mg Oral BID WC  . Chlorhexidine Gluconate Cloth  6 each Topical Q0600  . heparin  5,000 Units Subcutaneous Q8H  . hydrALAZINE  25 mg Oral Q8H  . multivitamin  1 tablet Oral Daily  . sevelamer carbonate  2.4 g Oral TID WC  BMET    Component Value Date/Time   NA 138 04/27/2019 0234   NA 137 08/23/2018 0840   K 5.6 (H) 04/27/2019 0234   CL 98 04/27/2019 0234   CO2 20 (L) 04/27/2019 0234   GLUCOSE 132 (H) 04/27/2019 0234   BUN 188 (H) 04/27/2019 0234   BUN 40 (H) 08/23/2018 0840   CREATININE 24.10 (H) 04/27/2019 0234   CALCIUM 8.8 (L) 04/27/2019 0234   GFRNONAA 2 (L) 04/27/2019 0234   GFRAA 2 (L) 04/27/2019 0234   CBC    Component Value Date/Time   WBC 3.1 (L) 04/27/2019 0234   RBC 2.68 (L) 04/27/2019 0234   HGB 7.9 (L) 04/27/2019 0234   HGB 9.4 (L) 03/19/2019 1429   HCT 24.2 (L) 04/27/2019 0234   HCT 29.6 (L) 03/19/2019 1429   PLT 145 (L) 04/27/2019 0234   PLT 227 03/19/2019 1429   MCV 90.3 04/27/2019 0234   MCV 90 03/19/2019 1429   MCH 29.5 04/27/2019 0234   MCHC 32.6 04/27/2019 0234   RDW 12.7 04/27/2019 0234   RDW 12.5 03/19/2019 1429   LYMPHSABS 0.4 (L) 04/26/2019 1227   LYMPHSABS 0.6 (L) 03/19/2019 1429   MONOABS 0.2 04/26/2019 1227   EOSABS 0.2 04/26/2019 1227   EOSABS 0.6 (H) 03/19/2019 1429   BASOSABS 0.0 04/26/2019 1227   BASOSABS 0.1 03/19/2019  1429    DialysisNR200 a K2/calcium 3.5 Duration 4 hours 15 minutes  Weight variation greater than 3 L blood pressure post 200/112 mmHg Missed 6/12dialysis treatments in the last 30 days  Estimated dry weight 81.5 kg  Hectorol 6 mcg TIW Micera75 mcg every 2 weeks Iron 50 mg weekly Heparin 6000 units   Assessment/Plan: 1. Uremia- due to noncompliance with HD.  Last HD was 04/14/19.  Will need to lower bfr/dfr due to significantly elevated BUN and treat as a new start.  Plan for short HD again today. 2. Hypertensive emergency- due to noncompliance.  Improving with HD, meds and UF 3. Anemia of ESRD- dropped due to missed doses of ESA.  May need to transfuse if continues to drop. 4. Secondary HPTH- poorly controlled due to noncompliance with meds and diet. Resume binders and vitamin D 5. Vascular access- having issues with low bfr will need imaging. 6. Acute on chronic combined systolic and diastolic CHF- UF with HD 7. Medical noncompliance- I had a frank discussion with him regarding the increased risk of sudden death with skipping dialysis.  He denies any suicidal ideation and was planning on going back to dialysis last week but "I didn't even know what day it was".  I told him that was due to his uremia.    Donetta Potts, MD [employer] [phone]

## 2019-04-28 NOTE — Plan of Care (Signed)
  Problem: Education: Goal: Knowledge of General Education information will improve Description: Including pain rating scale, medication(s)/side effects and non-pharmacologic comfort measures Outcome: Progressing   Problem: Health Behavior/Discharge Planning: Goal: Ability to manage health-related needs will improve 04/28/2019 2152 by Mollie Germany, RN Outcome: Progressing 04/28/2019 2151 by Mollie Germany, RN Outcome: Progressing   Problem: Clinical Measurements: Goal: Ability to maintain clinical measurements within normal limits will improve Outcome: Progressing Goal: Will remain free from infection Outcome: Progressing Goal: Respiratory complications will improve Outcome: Progressing

## 2019-04-28 NOTE — Progress Notes (Signed)
PROGRESS NOTE    Jason [name].  GLO:[id number] DOB: 1961/01/02 DOA: 04/26/2019 PCP: [name], FNP    Brief Narrative:  67 with history of hypertension, chronic combined systolic and diastolic congestive heart failure, ESRD on hemodialysis supposed to be Monday Wednesday Friday, last hemodialysis 13 days ago, anemia of chronic disease and noncompliance and missing dialysis appointments presented to the emergency room with headache. In the emergency room, blood pressures 230/130, potassium 5.2.  Patient on room air.  Admitted with dialysis need and hypertensive urgency.   Assessment & Plan:   Principal Problem:   Hypertensive urgency Active Problems:   Anemia of chronic disease   ESRD (end stage renal disease) on dialysis (HCC)   Hyperkalemia   Chronic combined systolic (congestive) and diastolic (congestive) heart failure (HCC)  Hypertensive urgency: Resumed home medication including amlodipine, Coreg and as needed hydralazine.  Blood pressures improving with dialysis.  Continue to monitor.  Uptitrate as needed. Mostly remains on the higher blood pressure range.  Goal of blood pressure is less than 180.  ESRD on hemodialysis, missed dialysis for 2 weeks: With greatly elevated urea and creatinine. Received dialysis, 04/27/2019.  Followed by nephrology, will probably need another subsequent dialysis in the hospital.   Hyperkalemia: Due to above.  On dialysis  Chronic combined heart failure: Euvolemic today.  Anemia of chronic disease: Receiving Epogen with dialysis.    DVT prophylaxis: Subcu heparin Code Status: Full code Family Communication: None Disposition Plan:  Status is: Inpatient  Remains inpatient appropriate because:Persistent severe electrolyte disturbances   Dispo: The patient is from: Home              Anticipated d/c is to: Home              Anticipated d/c date is: 2 days              Patient currently is not medically stable to  d/c.     Consultants:   Nephrology  Procedures:   HD  Antimicrobials:   None   Subjective: Patient seen and examined.  Overnight events noted.  Blood pressures less than 200. He has some shortness of breath but mostly on room air.  Did not sleep well last night.  Headache has improved.  Objective: Vitals:   04/28/19 0020 04/28/19 0442 04/28/19 0449 04/28/19 0816  BP: (!) 170/92 (!) 185/93  (!) 188/104  Pulse: 73 72  72  Resp: 18 (!) 22    Temp: 98.6 F (37 C) 97.7 F (36.5 C)    TempSrc: Oral Oral    SpO2: 90% (!) 84%  (!) 88%  Weight:   86.9 kg   Height:        Intake/Output Summary (Last 24 hours) at 04/28/2019 1009 Last data filed at 04/28/2019 0600 Gross per 24 hour  Intake 840 ml  Output 2000 ml  Net -1160 ml   Filed Weights   04/27/19 0740 04/27/19 1042 04/28/19 0449  Weight: 88.7 kg 86.7 kg 86.9 kg    Examination:  General exam: Appears calm and comfortable, chronically sick looking.  Not in any discomfort. Respiratory system: Clear to auscultation. Respiratory effort normal. Cardiovascular system: S1 & S2 heard, RRR.  Gastrointestinal system: Abdomen is nondistended, soft and nontender.  Central nervous system: Alert and oriented. No focal neurological deficits. Extremities: Symmetric 5 x 5 power. Skin: No rashes, lesions or ulcers Psychiatry: Judgement and insight appear normal. Mood & affect appropriate.  Right upper extremity fistula present.  Data Reviewed: I have personally reviewed following labs and imaging studies  CBC: Recent Labs  Lab 04/26/19 1227 04/26/19 1414 04/26/19 1850 04/27/19 0234  WBC 4.1  --  3.5* 3.1*  NEUTROABS 3.2  --   --   --   HGB 8.8* 8.8* 8.0* 7.9*  HCT 26.3* 26.0* 24.8* 24.2*  MCV 90.4  --  92.5 90.3  PLT 180  --  145* 295*   Basic Metabolic Panel: Recent Labs  Lab 04/26/19 1414 04/26/19 1850 04/27/19 0234  NA 136  --  138  K 5.2*  --  5.6*  CL  --   --  98  CO2  --   --  20*  GLUCOSE  --    --  132*  BUN  --   --  188*  CREATININE  --  24.75* 24.10*  CALCIUM  --   --  8.8*  MG  --  3.2*  --    GFR: Estimated Creatinine Clearance: 3.9 mL/min (A) (by C-G formula based on SCr of 24.1 mg/dL (H)). Liver Function Tests: Recent Labs  Lab 04/27/19 0234  AST 14*  ALT 32  ALKPHOS 23*  BILITOT 1.0  PROT 6.4*  ALBUMIN 3.7   No results for input(s): LIPASE, AMYLASE in the last 168 hours. No results for input(s): AMMONIA in the last 168 hours. Coagulation Profile: No results for input(s): INR, PROTIME in the last 168 hours. Cardiac Enzymes: No results for input(s): CKTOTAL, CKMB, CKMBINDEX, TROPONINI in the last 168 hours. BNP (last 3 results) No results for input(s): PROBNP in the last 8760 hours. HbA1C: No results for input(s): HGBA1C in the last 72 hours. CBG: No results for input(s): GLUCAP in the last 168 hours. Lipid Profile: No results for input(s): CHOL, HDL, LDLCALC, TRIG, CHOLHDL, LDLDIRECT in the last 72 hours. Thyroid Function Tests: Recent Labs    04/26/19 1850  TSH 2.089   Anemia Panel: Recent Labs    04/26/19 1850  FERRITIN 692*  TIBC 223*  IRON 60   Sepsis Labs: No results for input(s): PROCALCITON, LATICACIDVEN in the last 168 hours.  Recent Results (from the past 240 hour(s))  Respiratory Panel by RT PCR (Flu A&B, Covid) - Nasopharyngeal Swab     Status: None   Collection Time: 04/26/19  1:10 PM   Specimen: Nasopharyngeal Swab  Result Value Ref Range Status   SARS Coronavirus 2 by RT PCR NEGATIVE NEGATIVE Final    Comment: (NOTE) SARS-CoV-2 target nucleic acids are NOT DETECTED. The SARS-CoV-2 RNA is generally detectable in upper respiratoy specimens during the acute phase of infection. The lowest concentration of SARS-CoV-2 viral copies this assay can detect is 131 copies/mL. A negative result does not preclude SARS-Cov-2 infection and should not be used as the sole basis for treatment or other patient management decisions. A negative  result may occur with  improper specimen collection/handling, submission of specimen other than nasopharyngeal swab, presence of viral mutation(s) within the areas targeted by this assay, and inadequate number of viral copies (<131 copies/mL). A negative result must be combined with clinical observations, patient history, and epidemiological information. The expected result is Negative. Fact Sheet for Patients:  PinkCheek.be Fact Sheet for Healthcare Providers:  GravelBags.it This test is not yet ap proved or cleared by the [place] FDA and  has been authorized for detection and/or diagnosis of SARS-CoV-2 by FDA under an Emergency Use Authorization (EUA). This EUA will remain  in effect (meaning this test can be used) for  the duration of the COVID-19 declaration under Section 564(b)(1) of the Act, 21 U.S.C. section 360bbb-3(b)(1), unless the authorization is terminated or revoked sooner.    Influenza A by PCR NEGATIVE NEGATIVE Final   Influenza B by PCR NEGATIVE NEGATIVE Final    Comment: (NOTE) The Xpert Xpress SARS-CoV-2/FLU/RSV assay is intended as an aid in  the diagnosis of influenza from Nasopharyngeal swab specimens and  should not be used as a sole basis for treatment. Nasal washings and  aspirates are unacceptable for Xpert Xpress SARS-CoV-2/FLU/RSV  testing. Fact Sheet for Patients: PinkCheek.be Fact Sheet for Healthcare Providers: GravelBags.it This test is not yet approved or cleared by the [place] FDA and  has been authorized for detection and/or diagnosis of SARS-CoV-2 by  FDA under an Emergency Use Authorization (EUA). This EUA will remain  in effect (meaning this test can be used) for the duration of the  Covid-19 declaration under Section 564(b)(1) of the Act, 21  U.S.C. section 360bbb-3(b)(1), unless the authorization is  terminated or  revoked. Performed at [facility], [place] [address], [place], [place] [address]          Radiology Studies: DG Chest Port 1 View  Result Date: 04/26/2019 CLINICAL DATA:  Hypertension and missed dialysis. EXAM: PORTABLE CHEST 1 VIEW COMPARISON:  02/17/2019 FINDINGS: Patient is slightly rotated to the left. Lungs are adequately inflated demonstrate mild hazy prominence of the perihilar vessels suggesting minimal vascular congestion. Mild stable cardiomegaly. No effusion. Remainder the exam is unchanged. IMPRESSION: Mild cardiomegaly with suggestion of minimal vascular congestion. Electronically Signed   By: Marin Olp M.D.   On: 04/26/2019 12:51        Scheduled Meds: . amLODipine  10 mg Oral Daily  . carvedilol  25 mg Oral BID WC  . Chlorhexidine Gluconate Cloth  6 each Topical Q0600  . heparin  5,000 Units Subcutaneous Q8H  . hydrALAZINE  25 mg Oral Q8H  . multivitamin  1 tablet Oral Daily  . sevelamer carbonate  2.4 g Oral TID WC   Continuous Infusions: . sodium chloride    . sodium chloride       LOS: 2 days    Time spent: 30 minutes    Barb Merino, MD [facility] Pager [phone]

## 2019-04-28 NOTE — Plan of Care (Signed)
  Problem: Health Behavior/Discharge Planning: Goal: Ability to manage health-related needs will improve Outcome: Progressing   Problem: Clinical Measurements: Goal: Ability to maintain clinical measurements within normal limits will improve Outcome: Progressing Goal: Will remain free from infection Outcome: Progressing   

## 2019-04-29 DIAGNOSIS — I5042 Chronic combined systolic (congestive) and diastolic (congestive) heart failure: Secondary | ICD-10-CM

## 2019-04-29 DIAGNOSIS — Z992 Dependence on renal dialysis: Secondary | ICD-10-CM

## 2019-04-29 DIAGNOSIS — D638 Anemia in other chronic diseases classified elsewhere: Secondary | ICD-10-CM

## 2019-04-29 DIAGNOSIS — N186 End stage renal disease: Secondary | ICD-10-CM

## 2019-04-29 LAB — CBC
HCT: 22 % — ABNORMAL LOW (ref 39.0–52.0)
HCT: 25.4 % — ABNORMAL LOW (ref 39.0–52.0)
Hemoglobin: 7.3 g/dL — ABNORMAL LOW (ref 13.0–17.0)
Hemoglobin: 8.4 g/dL — ABNORMAL LOW (ref 13.0–17.0)
MCH: 29.8 pg (ref 26.0–34.0)
MCH: 29.9 pg (ref 26.0–34.0)
MCHC: 33.2 g/dL (ref 30.0–36.0)
MCHC: 33.1 g/dL (ref 30.0–36.0)
MCV: 89.8 fL (ref 80.0–100.0)
MCV: 90.4 fL (ref 80.0–100.0)
Platelets: 160 10*3/uL (ref 150–400)
Platelets: 201 10*3/uL (ref 150–400)
RBC: 2.45 MIL/uL — ABNORMAL LOW (ref 4.22–5.81)
RBC: 2.81 MIL/uL — ABNORMAL LOW (ref 4.22–5.81)
RDW: 12.5 % (ref 11.5–15.5)
RDW: 12.5 % (ref 11.5–15.5)
WBC: 3.1 10*3/uL — ABNORMAL LOW (ref 4.0–10.5)
WBC: 4.4 10*3/uL (ref 4.0–10.5)
nRBC: 0 % (ref 0.0–0.2)
nRBC: 0 % (ref 0.0–0.2)

## 2019-04-29 LAB — RENAL FUNCTION PANEL
Albumin: 2.9 g/dL — ABNORMAL LOW (ref 3.5–5.0)
Albumin: 3.5 g/dL (ref 3.5–5.0)
Anion gap: 11 (ref 5–15)
Anion gap: 12 (ref 5–15)
BUN: 64 mg/dL — ABNORMAL HIGH (ref 6–20)
BUN: 69 mg/dL — ABNORMAL HIGH (ref 6–20)
CO2: 25 mmol/L (ref 22–32)
CO2: 25 mmol/L (ref 22–32)
Calcium: 7.3 mg/dL — ABNORMAL LOW (ref 8.9–10.3)
Calcium: 8.2 mg/dL — ABNORMAL LOW (ref 8.9–10.3)
Chloride: 102 mmol/L (ref 98–111)
Chloride: 99 mmol/L (ref 98–111)
Creatinine, Ser: 10.66 mg/dL — ABNORMAL HIGH (ref 0.61–1.24)
Creatinine, Ser: 12.17 mg/dL — ABNORMAL HIGH (ref 0.61–1.24)
GFR calc Af Amer: 6 mL/min — ABNORMAL LOW (ref >60)
GFR calc Af Amer: 5 mL/min — ABNORMAL LOW (ref >60)
GFR calc non Af Amer: 5 mL/min — ABNORMAL LOW (ref >60)
GFR calc non Af Amer: 4 mL/min — ABNORMAL LOW (ref >60)
Glucose, Bld: 98 mg/dL (ref 70–99)
Glucose, Bld: 111 mg/dL — ABNORMAL HIGH (ref 70–99)
Phosphorus: 3.7 mg/dL (ref 2.5–4.6)
Phosphorus: 4.5 mg/dL (ref 2.5–4.6)
Potassium: 3.4 mmol/L — ABNORMAL LOW (ref 3.5–5.1)
Potassium: 3.7 mmol/L (ref 3.5–5.1)
Sodium: 138 mmol/L (ref 135–145)
Sodium: 136 mmol/L (ref 135–145)

## 2019-04-29 NOTE — Progress Notes (Signed)
Pt returned to unit from HD. 1.7L taken off per HD RN.

## 2019-04-29 NOTE — Progress Notes (Signed)
Patient ID: Jason Mariner., male   DOB: 1960/05/11, 59 y.o.   MRN: [id number] S: Feels much better after his second dialysis which unfortunately finished early this morning. O:BP (!) 175/95 (BP Location: Left Arm)   Pulse 63   Temp 97.8 F (36.6 C) (Oral)   Resp 20   Ht 6\' 2"  (1.88 m)   Wt 85.5 kg   SpO2 94%   BMI 24.21 kg/m   Intake/Output Summary (Last 24 hours) at 04/29/2019 1030 Last data filed at 04/29/2019 0932 Gross per 24 hour  Intake 836 ml  Output 1 ml  Net 835 ml   Intake/Output: I/O last 3 completed shifts: In: 71 [P.O.:956] Out: 1 [Stool:1]  Intake/Output this shift:  Total I/O In: 240 [P.O.:240] Out: -  Weight change: -3.152 kg Gen: NAD CVS: no rub Resp: cta Abd: benign Ext: no edema, RUE AVF +T/B  Recent Labs  Lab 04/26/19 1414 04/26/19 1850 04/27/19 0234 04/29/19 0241 04/29/19 0353  NA 136  --  138 138 136  K 5.2*  --  5.6* 3.4* 3.7  CL  --   --  98 102 99  CO2  --   --  20* 25 25  GLUCOSE  --   --  132* 98 111*  BUN  --   --  188* 64* 69*  CREATININE  --  24.75* 24.10* 10.66* 12.17*  ALBUMIN  --   --  3.7 2.9* 3.5  CALCIUM  --   --  8.8* 7.3* 8.2*  PHOS  --   --   --  3.7 4.5  AST  --   --  14*  --   --   ALT  --   --  32  --   --    Liver Function Tests: Recent Labs  Lab 04/27/19 0234 04/29/19 0241 04/29/19 0353  AST 14*  --   --   ALT 32  --   --   ALKPHOS 23*  --   --   BILITOT 1.0  --   --   PROT 6.4*  --   --   ALBUMIN 3.7 2.9* 3.5   No results for input(s): LIPASE, AMYLASE in the last 168 hours. No results for input(s): AMMONIA in the last 168 hours. CBC: Recent Labs  Lab 04/26/19 1227 04/26/19 1414 04/26/19 1850 04/26/19 1850 04/27/19 0234 04/29/19 0241 04/29/19 0353  WBC 4.1   < > 3.5*   < > 3.1* 3.1* 4.4  NEUTROABS 3.2  --   --   --   --   --   --   HGB 8.8*   < > 8.0*   < > 7.9* 7.3* 8.4*  HCT 26.3*   < > 24.8*   < > 24.2* 22.0* 25.4*  MCV 90.4  --  92.5  --  90.3 89.8 90.4  PLT 180   < > 145*   < > 145*  160 201   < > = values in this interval not displayed.   Cardiac Enzymes: No results for input(s): CKTOTAL, CKMB, CKMBINDEX, TROPONINI in the last 168 hours. CBG: No results for input(s): GLUCAP in the last 168 hours.  Iron Studies:  Recent Labs    04/26/19 1850  IRON 60  TIBC 223*  FERRITIN 692*   Studies/Results: No results found. Marland Kitchen amLODipine  10 mg Oral Daily  . carvedilol  25 mg Oral BID WC  . Chlorhexidine Gluconate Cloth  6 each Topical Q0600  . heparin  5,000 Units Subcutaneous Q8H  . hydrALAZINE  25 mg Oral Q8H  . multivitamin  1 tablet Oral Daily  . sevelamer carbonate  2.4 g Oral TID WC    BMET    Component Value Date/Time   NA 136 04/29/2019 0353   NA 137 08/23/2018 0840   K 3.7 04/29/2019 0353   CL 99 04/29/2019 0353   CO2 25 04/29/2019 0353   GLUCOSE 111 (H) 04/29/2019 0353   BUN 69 (H) 04/29/2019 0353   BUN 40 (H) 08/23/2018 0840   CREATININE 12.17 (H) 04/29/2019 0353   CALCIUM 8.2 (L) 04/29/2019 0353   GFRNONAA 4 (L) 04/29/2019 0353   GFRAA 5 (L) 04/29/2019 0353   CBC    Component Value Date/Time   WBC 4.4 04/29/2019 0353   RBC 2.81 (L) 04/29/2019 0353   HGB 8.4 (L) 04/29/2019 0353   HGB 9.4 (L) 03/19/2019 1429   HCT 25.4 (L) 04/29/2019 0353   HCT 29.6 (L) 03/19/2019 1429   PLT 201 04/29/2019 0353   PLT 227 03/19/2019 1429   MCV 90.4 04/29/2019 0353   MCV 90 03/19/2019 1429   MCH 29.9 04/29/2019 0353   MCHC 33.1 04/29/2019 0353   RDW 12.5 04/29/2019 0353   RDW 12.5 03/19/2019 1429   LYMPHSABS 0.4 (L) 04/26/2019 1227   LYMPHSABS 0.6 (L) 03/19/2019 1429   MONOABS 0.2 04/26/2019 1227   EOSABS 0.2 04/26/2019 1227   EOSABS 0.6 (H) 03/19/2019 1429   BASOSABS 0.0 04/26/2019 1227   BASOSABS 0.1 03/19/2019 1429    Dialysis:  [place], TTS, NR200 a K2/calcium 3.5 Duration 4 hours 15 minutes  Weight variation greater than 3 L blood pressure post 200/112 mmHg Missed 2 weeks of dialysis treatments in the last 30 days  Estimated dry  weight 81.5 kg  Hectorol 6 mcg TIW Micera75 mcg every 2 weeks Iron 50 mg weekly Heparin 6000 units   Assessment/Plan: 1. Uremia- due to noncompliance with HD. Last HD was 04/14/19. Will need to lower bfr/dfr due to significantly elevated BUN and treat as a new start.  Plan to get back on regular schedule Tuesday. 2. Hypertensive emergency- due to noncompliance. Improving with HD, meds and UF 3. Anemia of ESRD- dropped due to missed doses of ESA. May need to transfuse if continues to drop. 4. Secondary HPTH- poorly controlled due to noncompliance with meds and diet. Resume binders and vitamin D 5. Vascular access- having issues with low bfr will need imaging. 6. Acute on chronic combined systolic and diastolic CHF- UF with HD 7. Medical noncompliance- I had a frank discussion with him regarding the increased risk of sudden death with skipping dialysis. He denies any suicidal ideation and was planning on going back to dialysis last week but "I didn't even know what day it was".  I told him that was due to his uremia.    Donetta Potts, MD [employer] [phone]

## 2019-04-29 NOTE — Plan of Care (Signed)
  Problem: Nutrition: Goal: Adequate nutrition will be maintained Outcome: Progressing   Problem: Coping: Goal: Level of anxiety will decrease Outcome: Progressing   Problem: Pain Managment: Goal: General experience of comfort will improve Outcome: Progressing   

## 2019-04-29 NOTE — Progress Notes (Addendum)
PROGRESS NOTE    Jason [name].  AJO:[id number] DOB: [birth year] DOA: 04/26/2019 PCP: [name], FNP    Brief Narrative:  6 with history of hypertension, chronic combined systolic and diastolic congestive heart failure, ESRD on hemodialysis supposed to be Monday Wednesday Friday, last hemodialysis 13 days ago, anemia of chronic disease and noncompliance and missing dialysis appointments presented to the emergency room with headache.  In the emergency room, blood pressures 230/130, potassium 5.2.  Patient on room air.  Admitted with dialysis need and hypertensive urgency.  Following hospitalization and dialysis and resumption of home medications, blood pressures have improved, although trending back up again this morning.  Hemoglobin down to 7.5.  Nephrology following.  Plans for more dialysis today.  Patient states he is feeling better and thinking more clearly.   Assessment & Plan:   Principal Problem:   Hypertensive urgency Active Problems:   Anemia of chronic disease   ESRD (end stage renal disease) on dialysis (HCC)   Hyperkalemia   Chronic combined systolic (congestive) and diastolic (congestive) heart failure (HCC)  Hypertensive urgency: Restarted on home medication including amlodipine, Coreg and as needed hydralazine.  Blood pressures improving with dialysis.  Continue to monitor.  Uptitrate as needed.  Mostly remains on the higher blood pressure range.  Goal of blood pressure is less than 150  ESRD on hemodialysis, missed dialysis for 2 weeks: With greatly elevated urea and creatinine. Received dialysis, 04/27/2019, mild hemodialysis 4/17.  And getting again today. He will then be back on schedule with next dialysis formally planned for 4/20, possibly as outpatient  Hyperkalemia: Due to above.  On dialysis  Chronic combined heart failure: Euvolemic today.  Anemia of chronic disease: Receiving Epogen with dialysis.  Transfuse as needed    DVT prophylaxis: Subcu  heparin Code Status: Full code Family Communication: None Disposition Plan:  Anticipate discharge to home, possibly as early as tomorrow once cleared by nephrology.   Consultants:   Nephrology  Procedures:   HD  Antimicrobials:   None    Objective: Vitals:   04/28/19 1922 04/29/19 0332 04/29/19 0508 04/29/19 0814  BP: (!) 117/99 (!) 123/106 (!) 182/93 (!) 175/95  Pulse: 65 67 65 63  Resp: (!) 21 20  20   Temp: 98.3 F (36.8 C) 98.2 F (36.8 C)  97.8 F (36.6 C)  TempSrc: Oral Oral  Oral  SpO2: 96% 97%  94%  Weight:  85.5 kg    Height:        Intake/Output Summary (Last 24 hours) at 04/29/2019 1144 Last data filed at 04/29/2019 0932 Gross per 24 hour  Intake 836 ml  Output 1 ml  Net 835 ml   Filed Weights   04/27/19 1042 04/28/19 0449 04/29/19 0332  Weight: 86.7 kg 86.9 kg 85.5 kg    Examination:  General exam: Alert and oriented x3, no acute distress HEENT: Normocephalic, atraumatic, mucous membranes are moist Respiratory system: Clear to auscultation. Respiratory effort normal. Cardiovascular system: Regular rate and rhythm, S1-S2 Gastrointestinal system: Abdomen is nondistended, soft and nontender.  Positive bowel sounds Neuro: No focal deficits Extremities: No clubbing or cyanosis or edema.  Fistula noted in right upper extremity Skin: No rashes, lesions or ulcers Psychiatry: Appropriate, no evidence of psychoses    Data Reviewed: I have personally reviewed following labs and imaging studies  CBC: Recent Labs  Lab 04/26/19 1227 04/26/19 1227 04/26/19 1414 04/26/19 1850 04/27/19 0234 04/29/19 0241 04/29/19 0353  WBC 4.1  --   --  3.5* 3.1* 3.1* 4.4  NEUTROABS 3.2  --   --   --   --   --   --   HGB 8.8*   < > 8.8* 8.0* 7.9* 7.3* 8.4*  HCT 26.3*   < > 26.0* 24.8* 24.2* 22.0* 25.4*  MCV 90.4  --   --  92.5 90.3 89.8 90.4  PLT 180  --   --  145* 145* 160 201   < > = values in this interval not displayed.   Basic Metabolic Panel: Recent  Labs  Lab 04/26/19 1414 04/26/19 1850 04/27/19 0234 04/29/19 0241 04/29/19 0353  NA 136  --  138 138 136  K 5.2*  --  5.6* 3.4* 3.7  CL  --   --  98 102 99  CO2  --   --  20* 25 25  GLUCOSE  --   --  132* 98 111*  BUN  --   --  188* 64* 69*  CREATININE  --  24.75* 24.10* 10.66* 12.17*  CALCIUM  --   --  8.8* 7.3* 8.2*  MG  --  3.2*  --   --   --   PHOS  --   --   --  3.7 4.5   GFR: Estimated Creatinine Clearance: 7.8 mL/min (A) (by C-G formula based on SCr of 12.17 mg/dL (H)). Liver Function Tests: Recent Labs  Lab 04/27/19 0234 04/29/19 0241 04/29/19 0353  AST 14*  --   --   ALT 32  --   --   ALKPHOS 23*  --   --   BILITOT 1.0  --   --   PROT 6.4*  --   --   ALBUMIN 3.7 2.9* 3.5   No results for input(s): LIPASE, AMYLASE in the last 168 hours. No results for input(s): AMMONIA in the last 168 hours. Coagulation Profile: No results for input(s): INR, PROTIME in the last 168 hours. Cardiac Enzymes: No results for input(s): CKTOTAL, CKMB, CKMBINDEX, TROPONINI in the last 168 hours. BNP (last 3 results) No results for input(s): PROBNP in the last 8760 hours. HbA1C: No results for input(s): HGBA1C in the last 72 hours. CBG: No results for input(s): GLUCAP in the last 168 hours. Lipid Profile: No results for input(s): CHOL, HDL, LDLCALC, TRIG, CHOLHDL, LDLDIRECT in the last 72 hours. Thyroid Function Tests: Recent Labs    04/26/19 1850  TSH 2.089   Anemia Panel: Recent Labs    04/26/19 1850  FERRITIN 692*  TIBC 223*  IRON 60   Sepsis Labs: No results for input(s): PROCALCITON, LATICACIDVEN in the last 168 hours.  Recent Results (from the past 240 hour(s))  Respiratory Panel by RT PCR (Flu A&B, Covid) - Nasopharyngeal Swab     Status: None   Collection Time: 04/26/19  1:10 PM   Specimen: Nasopharyngeal Swab  Result Value Ref Range Status   SARS Coronavirus 2 by RT PCR NEGATIVE NEGATIVE Final    Comment: (NOTE) SARS-CoV-2 target nucleic acids are NOT  DETECTED. The SARS-CoV-2 RNA is generally detectable in upper respiratoy specimens during the acute phase of infection. The lowest concentration of SARS-CoV-2 viral copies this assay can detect is 131 copies/mL. A negative result does not preclude SARS-Cov-2 infection and should not be used as the sole basis for treatment or other patient management decisions. A negative result may occur with  improper specimen collection/handling, submission of specimen other than nasopharyngeal swab, presence of viral mutation(s) within the areas targeted by this  assay, and inadequate number of viral copies (<131 copies/mL). A negative result must be combined with clinical observations, patient history, and epidemiological information. The expected result is Negative. Fact Sheet for Patients:  PinkCheek.be Fact Sheet for Healthcare Providers:  GravelBags.it This test is not yet ap proved or cleared by the [place] FDA and  has been authorized for detection and/or diagnosis of SARS-CoV-2 by FDA under an Emergency Use Authorization (EUA). This EUA will remain  in effect (meaning this test can be used) for the duration of the COVID-19 declaration under Section 564(b)(1) of the Act, 21 U.S.C. section 360bbb-3(b)(1), unless the authorization is terminated or revoked sooner.    Influenza A by PCR NEGATIVE NEGATIVE Final   Influenza B by PCR NEGATIVE NEGATIVE Final    Comment: (NOTE) The Xpert Xpress SARS-CoV-2/FLU/RSV assay is intended as an aid in  the diagnosis of influenza from Nasopharyngeal swab specimens and  should not be used as a sole basis for treatment. Nasal washings and  aspirates are unacceptable for Xpert Xpress SARS-CoV-2/FLU/RSV  testing. Fact Sheet for Patients: PinkCheek.be Fact Sheet for Healthcare Providers: GravelBags.it This test is not yet approved or cleared  by the [place] FDA and  has been authorized for detection and/or diagnosis of SARS-CoV-2 by  FDA under an Emergency Use Authorization (EUA). This EUA will remain  in effect (meaning this test can be used) for the duration of the  Covid-19 declaration under Section 564(b)(1) of the Act, 21  U.S.C. section 360bbb-3(b)(1), unless the authorization is  terminated or revoked. Performed at [facility], [place] [address] [place], [place] [address]          Radiology Studies: No results found.      Scheduled Meds: . amLODipine  10 mg Oral Daily  . carvedilol  25 mg Oral BID WC  . Chlorhexidine Gluconate Cloth  6 each Topical Q0600  . heparin  5,000 Units Subcutaneous Q8H  . hydrALAZINE  25 mg Oral Q8H  . multivitamin  1 tablet Oral Daily  . sevelamer carbonate  2.4 g Oral TID WC   Continuous Infusions: . sodium chloride    . sodium chloride       LOS: 3 days    Time spent: 30 minutes    Annita Brod, MD [facility]

## 2019-04-30 DIAGNOSIS — E8770 Fluid overload, unspecified: Secondary | ICD-10-CM | POA: Diagnosis not present

## 2019-04-30 DIAGNOSIS — N186 End stage renal disease: Secondary | ICD-10-CM | POA: Diagnosis not present

## 2019-04-30 DIAGNOSIS — I161 Hypertensive emergency: Secondary | ICD-10-CM | POA: Diagnosis not present

## 2019-04-30 DIAGNOSIS — Z992 Dependence on renal dialysis: Secondary | ICD-10-CM | POA: Diagnosis not present

## 2019-04-30 LAB — CBC
HCT: 22.6 % — ABNORMAL LOW (ref 39.0–52.0)
Hemoglobin: 7.5 g/dL — ABNORMAL LOW (ref 13.0–17.0)
MCH: 30.6 pg (ref 26.0–34.0)
MCHC: 33.2 g/dL (ref 30.0–36.0)
MCV: 92.2 fL (ref 80.0–100.0)
Platelets: 167 10*3/uL (ref 150–400)
RBC: 2.45 MIL/uL — ABNORMAL LOW (ref 4.22–5.81)
RDW: 12.6 % (ref 11.5–15.5)
WBC: 4 10*3/uL (ref 4.0–10.5)
nRBC: 0 % (ref 0.0–0.2)

## 2019-04-30 NOTE — Discharge Summary (Signed)
Physician Discharge Summary  Jason [name]. HMC:[id number] DOB: 1960-11-10 DOA: 04/26/2019  PCP: [name], FNP  Admit date: 04/26/2019 Discharge date: 04/30/2019  Admitted From: Home Disposition: Home  Recommendations for Outpatient Follow-up:  1. Go to dialysis center tomorrow.  Discharge Condition: Stable CODE STATUS: Full code Diet recommendation: Low-salt diet  Discharge summary: 59 year old gentleman with history of hypertension, chronic combined systolic and diastolic congestive heart failure, ESRD hemodialysis P on Monday Wednesday Friday schedule, noncompliance to dialysis sessions presented to the emergency room after missing 2 weeks of hemodialysis with headache and blood pressure of 230/130 and potassium of 5.2.  Patient was admitted to the hospital, treated with temporizing measures, initially with labetalol infusion and then ultimately with oral blood pressure medications.  Admitted and treated with 2 subsequent hemodialysis sessions with improvement of symptoms and hemodynamics.  Currently stabilized.  Blood pressure control still remains a challenge, however was not consistently taking medications at home hence put back on his 3 drug regimen.  Extensively counseled by this provider and nephrologist.  He is agreeable to continue to keep up with his dialysis schedule and he is a schedule next dialysis tomorrow in his regular dialysis center.  Clinically stable to be discharged today as per nephrology plan to keep up with dialysis tomorrow.  Discharge Diagnoses:  Principal Problem:   Hypertensive urgency Active Problems:   Anemia of chronic disease   ESRD (end stage renal disease) on dialysis (HCC)   Hyperkalemia   Chronic combined systolic (congestive) and diastolic (congestive) heart failure [facility])    Discharge Instructions  Discharge Instructions    Diet - low sodium heart healthy   Complete by: As directed    Discharge instructions   Complete by: As  directed    Go to your hemodialysis tomorrow. Take all 3 of your blood pressure medicines.  If you do not have medications or run out of it, you can discuss with providers at dialysis center.   Increase activity slowly   Complete by: As directed      Allergies as of 04/30/2019      Reactions   Lisinopril Cough      Medication List    TAKE these medications   amLODipine 10 MG tablet Commonly known as: NORVASC Take 10 mg by mouth every evening.   carvedilol 25 MG tablet Commonly known as: COREG Take 1 tablet (25 mg total) by mouth 2 (two) times daily with a meal.   hydrALAZINE 25 MG tablet Commonly known as: APRESOLINE Take 1 tablet (25 mg total) by mouth every 8 (eight) hours.   multivitamin Tabs tablet Take 1 tablet by mouth daily.   Renvela 2.4 g Pack Generic drug: sevelamer carbonate Take 2.4 g by mouth 3 (three) times daily with meals.       Allergies  Allergen Reactions  . Lisinopril Cough    Consultations:  Nephrology   Procedures/Studies: DG Chest Port 1 View  Result Date: 04/26/2019 CLINICAL DATA:  Hypertension and missed dialysis. EXAM: PORTABLE CHEST 1 VIEW COMPARISON:  02/17/2019 FINDINGS: Patient is slightly rotated to the left. Lungs are adequately inflated demonstrate mild hazy prominence of the perihilar vessels suggesting minimal vascular congestion. Mild stable cardiomegaly. No effusion. Remainder the exam is unchanged. IMPRESSION: Mild cardiomegaly with suggestion of minimal vascular congestion. Electronically Signed   By: Marin Olp M.D.   On: 04/26/2019 12:51      Subjective: Patient seen and examined.  No overnight events.  Denies any headache or nausea  vomiting.  Blood pressure elevated before taking antihypertensives, however after all 3 antihypertensives blood pressures getting down.   Discharge Exam: Vitals:   04/30/19 0757 04/30/19 0905  BP: (!) 201/99 (!) 181/92  Pulse: 67 66  Resp:    Temp: 98.2 F (36.8 C)   SpO2: 97%     Vitals:   04/30/19 0230 04/30/19 0615 04/30/19 0757 04/30/19 0905  BP: (!) 176/95 (!) 128/109 (!) 201/99 (!) 181/92  Pulse: 60 70 67 66  Resp: 20     Temp: 97.8 F (36.6 C)  98.2 F (36.8 C)   TempSrc: Oral  Oral   SpO2: 96%  97%   Weight:      Height:        General: Pt is alert, awake, not in acute distress Cardiovascular: RRR, S1/S2 +, no rubs, no gallops Respiratory: CTA bilaterally, no wheezing, no rhonchi Abdominal: Soft, NT, ND, bowel sounds + Extremities: no edema, no cyanosis Right upper extremity AV fistula with good thrill.    The results of significant diagnostics from this hospitalization (including imaging, microbiology, ancillary and laboratory) are listed below for reference.     Microbiology: Recent Results (from the past 240 hour(s))  Respiratory Panel by RT PCR (Flu A&B, Covid) - Nasopharyngeal Swab     Status: None   Collection Time: 04/26/19  1:10 PM   Specimen: Nasopharyngeal Swab  Result Value Ref Range Status   SARS Coronavirus 2 by RT PCR NEGATIVE NEGATIVE Final    Comment: (NOTE) SARS-CoV-2 target nucleic acids are NOT DETECTED. The SARS-CoV-2 RNA is generally detectable in upper respiratoy specimens during the acute phase of infection. The lowest concentration of SARS-CoV-2 viral copies this assay can detect is 131 copies/mL. A negative result does not preclude SARS-Cov-2 infection and should not be used as the sole basis for treatment or other patient management decisions. A negative result may occur with  improper specimen collection/handling, submission of specimen other than nasopharyngeal swab, presence of viral mutation(s) within the areas targeted by this assay, and inadequate number of viral copies (<131 copies/mL). A negative result must be combined with clinical observations, patient history, and epidemiological information. The expected result is Negative. Fact Sheet for Patients:   PinkCheek.be Fact Sheet for Healthcare Providers:  GravelBags.it This test is not yet ap proved or cleared by the [place] FDA and  has been authorized for detection and/or diagnosis of SARS-CoV-2 by FDA under an Emergency Use Authorization (EUA). This EUA will remain  in effect (meaning this test can be used) for the duration of the COVID-19 declaration under Section 564(b)(1) of the Act, 21 U.S.C. section 360bbb-3(b)(1), unless the authorization is terminated or revoked sooner.    Influenza A by PCR NEGATIVE NEGATIVE Final   Influenza B by PCR NEGATIVE NEGATIVE Final    Comment: (NOTE) The Xpert Xpress SARS-CoV-2/FLU/RSV assay is intended as an aid in  the diagnosis of influenza from Nasopharyngeal swab specimens and  should not be used as a sole basis for treatment. Nasal washings and  aspirates are unacceptable for Xpert Xpress SARS-CoV-2/FLU/RSV  testing. Fact Sheet for Patients: PinkCheek.be Fact Sheet for Healthcare Providers: GravelBags.it This test is not yet approved or cleared by the [place] FDA and  has been authorized for detection and/or diagnosis of SARS-CoV-2 by  FDA under an Emergency Use Authorization (EUA). This EUA will remain  in effect (meaning this test can be used) for the duration of the  Covid-19 declaration under Section 564(b)(1) of the Act,  21  U.S.C. section 360bbb-3(b)(1), unless the authorization is  terminated or revoked. Performed at [facility], [place] [address] [place], [place] [address]      Labs: BNP (last 3 results) No results for input(s): BNP in the last 8760 hours. Basic Metabolic Panel: Recent Labs  Lab 04/26/19 1414 04/26/19 1850 04/27/19 0234 04/29/19 0241 04/29/19 0353  NA 136  --  138 138 136  K 5.2*  --  5.6* 3.4* 3.7  CL  --   --  98 102 99  CO2  --   --  20* 25 25  GLUCOSE  --   --  132*  98 111*  BUN  --   --  188* 64* 69*  CREATININE  --  24.75* 24.10* 10.66* 12.17*  CALCIUM  --   --  8.8* 7.3* 8.2*  MG  --  3.2*  --   --   --   PHOS  --   --   --  3.7 4.5   Liver Function Tests: Recent Labs  Lab 04/27/19 0234 04/29/19 0241 04/29/19 0353  AST 14*  --   --   ALT 32  --   --   ALKPHOS 23*  --   --   BILITOT 1.0  --   --   PROT 6.4*  --   --   ALBUMIN 3.7 2.9* 3.5   No results for input(s): LIPASE, AMYLASE in the last 168 hours. No results for input(s): AMMONIA in the last 168 hours. CBC: Recent Labs  Lab 04/26/19 1227 04/26/19 1414 04/26/19 1850 04/27/19 0234 04/29/19 0241 04/29/19 0353 04/30/19 0546  WBC 4.1   < > 3.5* 3.1* 3.1* 4.4 4.0  NEUTROABS 3.2  --   --   --   --   --   --   HGB 8.8*   < > 8.0* 7.9* 7.3* 8.4* 7.5*  HCT 26.3*   < > 24.8* 24.2* 22.0* 25.4* 22.6*  MCV 90.4   < > 92.5 90.3 89.8 90.4 92.2  PLT 180   < > 145* 145* 160 201 167   < > = values in this interval not displayed.   Cardiac Enzymes: No results for input(s): CKTOTAL, CKMB, CKMBINDEX, TROPONINI in the last 168 hours. BNP: Invalid input(s): POCBNP CBG: No results for input(s): GLUCAP in the last 168 hours. D-Dimer No results for input(s): DDIMER in the last 72 hours. Hgb A1c No results for input(s): HGBA1C in the last 72 hours. Lipid Profile No results for input(s): CHOL, HDL, LDLCALC, TRIG, CHOLHDL, LDLDIRECT in the last 72 hours. Thyroid function studies No results for input(s): TSH, T4TOTAL, T3FREE, THYROIDAB in the last 72 hours.  Invalid input(s): FREET3 Anemia work up No results for input(s): VITAMINB12, FOLATE, FERRITIN, TIBC, IRON, RETICCTPCT in the last 72 hours. Urinalysis    Component Value Date/Time   COLORURINE YELLOW 01/16/2017 1420   APPEARANCEUR CLEAR 01/16/2017 1420   LABSPEC 1.013 01/16/2017 1420   PHURINE 5.0 01/16/2017 1420   GLUCOSEU NEGATIVE 01/16/2017 1420   HGBUR SMALL (A) 01/16/2017 1420   BILIRUBINUR Negative 03/19/2019 1354    KETONESUR negative 08/23/2018 0802   KETONESUR NEGATIVE 01/16/2017 1420   PROTEINUR Positive (A) 03/19/2019 1354   PROTEINUR 100 (A) 01/16/2017 1420   UROBILINOGEN 0.2 03/19/2019 1354   UROBILINOGEN 0.2 02/26/2015 1028   NITRITE Negative 03/19/2019 1354   NITRITE NEGATIVE 01/16/2017 1420   LEUKOCYTESUR Negative 03/19/2019 1354   Sepsis Labs Invalid input(s): PROCALCITONIN,  WBC,  [place] [facility] Recent Results (from the past 240 hour(s))  Respiratory Panel by RT PCR (Flu A&B, Covid) - Nasopharyngeal Swab     Status: None   Collection Time: 04/26/19  1:10 PM   Specimen: Nasopharyngeal Swab  Result Value Ref Range Status   SARS Coronavirus 2 by RT PCR NEGATIVE NEGATIVE Final    Comment: (NOTE) SARS-CoV-2 target nucleic acids are NOT DETECTED. The SARS-CoV-2 RNA is generally detectable in upper respiratoy specimens during the acute phase of infection. The lowest concentration of SARS-CoV-2 viral copies this assay can detect is 131 copies/mL. A negative result does not preclude SARS-Cov-2 infection and should not be used as the sole basis for treatment or other patient management decisions. A negative result may occur with  improper specimen collection/handling, submission of specimen other than nasopharyngeal swab, presence of viral mutation(s) within the areas targeted by this assay, and inadequate number of viral copies (<131 copies/mL). A negative result must be combined with clinical observations, patient history, and epidemiological information. The expected result is Negative. Fact Sheet for Patients:  PinkCheek.be Fact Sheet for Healthcare Providers:  GravelBags.it This test is not yet ap proved or cleared by the [place] FDA and  has been authorized for detection and/or diagnosis of SARS-CoV-2 by FDA under an Emergency Use Authorization (EUA). This EUA will remain  in effect (meaning this test can be  used) for the duration of the COVID-19 declaration under Section 564(b)(1) of the Act, 21 U.S.C. section 360bbb-3(b)(1), unless the authorization is terminated or revoked sooner.    Influenza A by PCR NEGATIVE NEGATIVE Final   Influenza B by PCR NEGATIVE NEGATIVE Final    Comment: (NOTE) The Xpert Xpress SARS-CoV-2/FLU/RSV assay is intended as an aid in  the diagnosis of influenza from Nasopharyngeal swab specimens and  should not be used as a sole basis for treatment. Nasal washings and  aspirates are unacceptable for Xpert Xpress SARS-CoV-2/FLU/RSV  testing. Fact Sheet for Patients: PinkCheek.be Fact Sheet for Healthcare Providers: GravelBags.it This test is not yet approved or cleared by the [place] FDA and  has been authorized for detection and/or diagnosis of SARS-CoV-2 by  FDA under an Emergency Use Authorization (EUA). This EUA will remain  in effect (meaning this test can be used) for the duration of the  Covid-19 declaration under Section 564(b)(1) of the Act, 21  U.S.C. section 360bbb-3(b)(1), unless the authorization is  terminated or revoked. Performed at [facility], [place] [address], [place], [place] [address]      Time coordinating discharge: 32 minutes  SIGNED:   Barb Merino, MD  [facility] 04/30/2019, 11:23 AM

## 2019-04-30 NOTE — TOC Initial Note (Signed)
Transition of Care (TOC) - Initial/Assessment Note    Patient Details  Name: Jason [name]. MRN: [id number] Date of Birth: [birth year]  Transition of [facility]) CM/SW Contact:    Zenon Mayo, RN Phone Number: 04/30/2019, 1:20 PM  Clinical Narrative:                 Patient is for dc today, his daughter will be coming to pick him up.  He goes to [facility] on [address]  For medications.  He has no needs.  Expected Discharge Plan: Home/Self Care Barriers to Discharge: No Barriers Identified   Patient Goals and CMS Choice Patient states their goals for this hospitalization and ongoing recovery are:: get better CMS Medicare.gov Compare Post Acute Care list provided to:: Patient    Expected Discharge Plan and Services Expected Discharge Plan: Home/Self Care In-house Referral: NA Discharge Planning Services: CM Consult   Living arrangements for the past 2 months: Single Family Home Expected Discharge Date: 04/30/19                 DME Agency: NA       HH Arranged: NA          Prior Living Arrangements/Services Living arrangements for the past 2 months: Single Family Home Lives with:: Self Patient language and need for interpreter reviewed:: Yes Do you feel safe going back to the place where you live?: Yes      Need for Family Participation in Patient Care: Yes (Comment) Care giver support system in place?: Yes (comment)   Criminal Activity/Legal Involvement Pertinent to Current Situation/Hospitalization: No - Comment as needed  Activities of Daily Living      Permission Sought/Granted                  Emotional Assessment Appearance:: Appears stated age Attitude/Demeanor/Rapport: Engaged Affect (typically observed): Appropriate Orientation: : Oriented to Situation, Oriented to  Time, Oriented to Place, Oriented to Self Alcohol / Substance Use: Not Applicable Psych Involvement: No (comment)  Admission diagnosis:  End stage renal disease ([place])  [N18.6] Hyperkalemia [E87.5] Hypertensive urgency [I16.0] Patient Active Problem List   Diagnosis Date Noted  . Hyperkalemia 04/26/2019  . Chronic combined systolic (congestive) and diastolic (congestive) heart failure ([place]) 04/26/2019  . Hypertensive urgency 02/17/2019  . Hypertensive emergency 02/17/2019  . Leg edema, left 02/17/2019  . Bilateral impacted cerumen 08/24/2018  . History of elevated PSA 08/24/2018  . Hemodialysis patient ([place]) 05/28/2018  . Vitamin D deficiency 05/28/2018  . Left inguinal hernia s/p lap repair w mesh 11/18/2017 11/18/2017  . Scrotal hernia, right, s/p lap repair w mesh 11/18/2017 05/30/2017  . Arteriovenous fistula (Right arm) for dialysis 05/30/2017  . Umbilical hernia s/p primary repair 11/18/2017 05/30/2017  . Anasarca 02/18/2017  . ESRD (end stage renal disease) on dialysis ([place])   . NSVT (nonsustained ventricular tachycardia) ([place])   . Evaluation by psychiatric service required 01/19/2017  . Elevated troponin 01/16/2017  . Acute CHF (congestive heart failure) ([place]) 01/16/2017  . Venous stasis ulcer ([place]) 01/16/2017  . Essential hypertension 02/26/2015  . Abnormal EKG 01/30/2015  . Anemia of chronic disease 01/29/2015   PCP:  [name], FNP Pharmacy:   [facility] [address], [place] - [place] [address] Phone: [phone] Fax: [phone]  [facility] [place], [place] - [address] [place], [address] [place] [address] Phone: [phone] Fax: [phone]  Social Determinants of Health (SDOH) Interventions    Readmission Risk Interventions Readmission Risk Prevention Plan 04/30/2019  Transportation Screening Complete  PCP or Specialist Appt within 3-5 Days Complete  HRI or [place] Complete  Social Work Consult for [place] Planning/Counseling Complete  Palliative Care Screening Not  Applicable  Medication Review Press photographer) Complete  Some recent data might be hidden

## 2019-04-30 NOTE — Care Management Important Message (Signed)
Important Message  Patient Details  Name: Jason [name]. MRN: [id number] Date of Birth: May 01, 1960   Medicare Important Message Given:  Yes     Shelda Altes 04/30/2019, 9:41 AM

## 2019-05-01 ENCOUNTER — Telehealth: Payer: Self-pay | Admitting: Nurse Practitioner

## 2019-05-01 DIAGNOSIS — N186 End stage renal disease: Secondary | ICD-10-CM | POA: Diagnosis not present

## 2019-05-01 DIAGNOSIS — D631 Anemia in chronic kidney disease: Secondary | ICD-10-CM | POA: Diagnosis not present

## 2019-05-01 DIAGNOSIS — Z992 Dependence on renal dialysis: Secondary | ICD-10-CM | POA: Diagnosis not present

## 2019-05-01 DIAGNOSIS — D509 Iron deficiency anemia, unspecified: Secondary | ICD-10-CM | POA: Diagnosis not present

## 2019-05-01 DIAGNOSIS — N2581 Secondary hyperparathyroidism of renal origin: Secondary | ICD-10-CM | POA: Diagnosis not present

## 2019-05-01 NOTE — Telephone Encounter (Signed)
Transition of care contact from inpatient facility  Date of Discharge: 04/30/2019 Date of Contact: 05/01/2019 Method of contact: Phone  Attempted to contact patient to discuss transition of care from inpatient admission. Patient did not answer the phone. Message was left on the patient's voicemail with call back number [phone].

## 2019-05-02 ENCOUNTER — Telehealth: Payer: Self-pay | Admitting: Nephrology

## 2019-05-02 NOTE — Telephone Encounter (Signed)
Transition of care contact from inpatient facility  Date of discharge: 04/30/19 Date of contact: 05/02/19 Method: Phone Spoke to: Patient  Patient contacted to discuss transition of care from recent inpatient hospitalization. Patient was admitted to [facility] from 4/15-4/19/21 with discharge diagnosis of hypertensive urgency 2/2 missed dialysis  Medication changes were reviewed.  Patient will follow up with his/her outpatient HD unit on: He did attend dialysis on 4/20 and his next appointment is tomorrow 4/22.

## 2019-05-03 DIAGNOSIS — N2581 Secondary hyperparathyroidism of renal origin: Secondary | ICD-10-CM | POA: Diagnosis not present

## 2019-05-03 DIAGNOSIS — D509 Iron deficiency anemia, unspecified: Secondary | ICD-10-CM | POA: Diagnosis not present

## 2019-05-03 DIAGNOSIS — Z992 Dependence on renal dialysis: Secondary | ICD-10-CM | POA: Diagnosis not present

## 2019-05-03 DIAGNOSIS — N186 End stage renal disease: Secondary | ICD-10-CM | POA: Diagnosis not present

## 2019-05-03 DIAGNOSIS — D631 Anemia in chronic kidney disease: Secondary | ICD-10-CM | POA: Diagnosis not present

## 2019-05-05 DIAGNOSIS — N186 End stage renal disease: Secondary | ICD-10-CM | POA: Diagnosis not present

## 2019-05-05 DIAGNOSIS — Z992 Dependence on renal dialysis: Secondary | ICD-10-CM | POA: Diagnosis not present

## 2019-05-05 DIAGNOSIS — D509 Iron deficiency anemia, unspecified: Secondary | ICD-10-CM | POA: Diagnosis not present

## 2019-05-05 DIAGNOSIS — N2581 Secondary hyperparathyroidism of renal origin: Secondary | ICD-10-CM | POA: Diagnosis not present

## 2019-05-05 DIAGNOSIS — D631 Anemia in chronic kidney disease: Secondary | ICD-10-CM | POA: Diagnosis not present

## 2019-05-08 DIAGNOSIS — Z992 Dependence on renal dialysis: Secondary | ICD-10-CM | POA: Diagnosis not present

## 2019-05-08 DIAGNOSIS — D509 Iron deficiency anemia, unspecified: Secondary | ICD-10-CM | POA: Diagnosis not present

## 2019-05-08 DIAGNOSIS — N186 End stage renal disease: Secondary | ICD-10-CM | POA: Diagnosis not present

## 2019-05-08 DIAGNOSIS — D631 Anemia in chronic kidney disease: Secondary | ICD-10-CM | POA: Diagnosis not present

## 2019-05-08 DIAGNOSIS — N2581 Secondary hyperparathyroidism of renal origin: Secondary | ICD-10-CM | POA: Diagnosis not present

## 2019-05-09 ENCOUNTER — Inpatient Hospital Stay: Payer: Self-pay | Admitting: Family Medicine

## 2019-05-10 DIAGNOSIS — Z992 Dependence on renal dialysis: Secondary | ICD-10-CM | POA: Diagnosis not present

## 2019-05-10 DIAGNOSIS — N2581 Secondary hyperparathyroidism of renal origin: Secondary | ICD-10-CM | POA: Diagnosis not present

## 2019-05-10 DIAGNOSIS — N186 End stage renal disease: Secondary | ICD-10-CM | POA: Diagnosis not present

## 2019-05-10 DIAGNOSIS — D631 Anemia in chronic kidney disease: Secondary | ICD-10-CM | POA: Diagnosis not present

## 2019-05-10 DIAGNOSIS — D509 Iron deficiency anemia, unspecified: Secondary | ICD-10-CM | POA: Diagnosis not present

## 2019-05-12 DIAGNOSIS — Z992 Dependence on renal dialysis: Secondary | ICD-10-CM | POA: Diagnosis not present

## 2019-05-12 DIAGNOSIS — I129 Hypertensive chronic kidney disease with stage 1 through stage 4 chronic kidney disease, or unspecified chronic kidney disease: Secondary | ICD-10-CM | POA: Diagnosis not present

## 2019-05-12 DIAGNOSIS — N186 End stage renal disease: Secondary | ICD-10-CM | POA: Diagnosis not present

## 2019-05-12 DIAGNOSIS — D509 Iron deficiency anemia, unspecified: Secondary | ICD-10-CM | POA: Diagnosis not present

## 2019-05-12 DIAGNOSIS — N2581 Secondary hyperparathyroidism of renal origin: Secondary | ICD-10-CM | POA: Diagnosis not present

## 2019-05-12 DIAGNOSIS — D631 Anemia in chronic kidney disease: Secondary | ICD-10-CM | POA: Diagnosis not present

## 2019-05-15 DIAGNOSIS — N186 End stage renal disease: Secondary | ICD-10-CM | POA: Diagnosis not present

## 2019-05-15 DIAGNOSIS — D631 Anemia in chronic kidney disease: Secondary | ICD-10-CM | POA: Diagnosis not present

## 2019-05-15 DIAGNOSIS — N2581 Secondary hyperparathyroidism of renal origin: Secondary | ICD-10-CM | POA: Diagnosis not present

## 2019-05-15 DIAGNOSIS — D509 Iron deficiency anemia, unspecified: Secondary | ICD-10-CM | POA: Diagnosis not present

## 2019-05-15 DIAGNOSIS — Z992 Dependence on renal dialysis: Secondary | ICD-10-CM | POA: Diagnosis not present

## 2019-05-17 DIAGNOSIS — D631 Anemia in chronic kidney disease: Secondary | ICD-10-CM | POA: Diagnosis not present

## 2019-05-17 DIAGNOSIS — N186 End stage renal disease: Secondary | ICD-10-CM | POA: Diagnosis not present

## 2019-05-17 DIAGNOSIS — D509 Iron deficiency anemia, unspecified: Secondary | ICD-10-CM | POA: Diagnosis not present

## 2019-05-17 DIAGNOSIS — N2581 Secondary hyperparathyroidism of renal origin: Secondary | ICD-10-CM | POA: Diagnosis not present

## 2019-05-17 DIAGNOSIS — Z992 Dependence on renal dialysis: Secondary | ICD-10-CM | POA: Diagnosis not present

## 2019-05-19 DIAGNOSIS — D509 Iron deficiency anemia, unspecified: Secondary | ICD-10-CM | POA: Diagnosis not present

## 2019-05-19 DIAGNOSIS — N2581 Secondary hyperparathyroidism of renal origin: Secondary | ICD-10-CM | POA: Diagnosis not present

## 2019-05-19 DIAGNOSIS — N186 End stage renal disease: Secondary | ICD-10-CM | POA: Diagnosis not present

## 2019-05-19 DIAGNOSIS — Z992 Dependence on renal dialysis: Secondary | ICD-10-CM | POA: Diagnosis not present

## 2019-05-19 DIAGNOSIS — D631 Anemia in chronic kidney disease: Secondary | ICD-10-CM | POA: Diagnosis not present

## 2019-05-21 ENCOUNTER — Telehealth (INDEPENDENT_AMBULATORY_CARE_PROVIDER_SITE_OTHER): Payer: Medicare Other | Admitting: Cardiology

## 2019-05-21 ENCOUNTER — Encounter: Payer: Self-pay | Admitting: Cardiology

## 2019-05-21 VITALS — Ht 74.0 in

## 2019-05-21 DIAGNOSIS — I10 Essential (primary) hypertension: Secondary | ICD-10-CM | POA: Diagnosis not present

## 2019-05-21 DIAGNOSIS — Z992 Dependence on renal dialysis: Secondary | ICD-10-CM

## 2019-05-21 DIAGNOSIS — J9859 Other diseases of mediastinum, not elsewhere classified: Secondary | ICD-10-CM | POA: Insufficient documentation

## 2019-05-21 DIAGNOSIS — I5042 Chronic combined systolic (congestive) and diastolic (congestive) heart failure: Secondary | ICD-10-CM

## 2019-05-21 DIAGNOSIS — N186 End stage renal disease: Secondary | ICD-10-CM

## 2019-05-21 NOTE — Patient Instructions (Signed)
Medication Instructions:  Your physician recommends that you continue on your current medications as directed. Please refer to the Current Medication list given to you today.  *If you need a refill on your cardiac medications before your next appointment, please call your pharmacy*    Follow-Up: At [facility], you and your health needs are our priority.  As part of our continuing mission to provide you with exceptional heart care, we have created designated Provider Care Teams.  These Care Teams include your primary Cardiologist (physician) and Advanced Practice Providers (APPs -  Physician Assistants and Nurse Practitioners) who all work together to provide you with the care you need, when you need it.  We recommend signing up for the patient portal called "MyChart".  Sign up information is provided on this After Visit Summary.  MyChart is used to connect with patients for Virtual Visits (Telemedicine).  Patients are able to view lab/test results, encounter notes, upcoming appointments, etc.  Non-urgent messages can be sent to your provider as well.   To learn more about what you can do with MyChart, go to [email or web address].    Your next appointment:   6 month(s)  The format for your next appointment:   In Person  Provider:   Kirk Ruths, MD   Other Instructions Please call our office 2 months in advance to schedule your follow-up appointment with Dr. [name].

## 2019-05-21 NOTE — Progress Notes (Signed)
Virtual Visit via Telephone Note   This visit type was conducted due to national recommendations for restrictions regarding the COVID-19 Pandemic (e.g. social distancing) in an effort to limit this patient's exposure and mitigate transmission in our community.  Due to his co-morbid illnesses, this patient is at least at moderate risk for complications without adequate follow up.  This format is felt to be most appropriate for this patient at this time.  The patient did not have access to video technology/had technical difficulties with video requiring transitioning to audio format only (telephone).  All issues noted in this document were discussed and addressed.  No physical exam could be performed with this format.  Please refer to the patient's chart for his  consent to telehealth for [facility].   The patient was identified using 2 identifiers.  Date:  05/21/2019   ID:  Jason [name]., DOB 1960-01-20, MRN [id number]  Patient Location: Home Provider Location: Home  PCP:  [name], FNP  Cardiologist:  Kirk Ruths, MD  Electrophysiologist:  None   Evaluation Performed:  Follow-Up Visit  Chief Complaint:  none  History of Present Illness:    Jason [name]. is a 59 y.o. 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  The patient saw Dr. [name] in July 2020.  He was contacted today for routine follow-up.  His last echocardiogram Feb 2021 showed an ejection fraction of 50 to 55% with LVH and moderate LAE.  His RV had been dilated on a prior echo in 2019 but that was not noted on his echo Feb 2021.  He was previously on the transplant list at [facility] and in January 2020 had a stress echo that showed no ischemia.  A CT scan in March 2020 at [facility] revealed a large mediastinal mass felt to  represent a cyst with some degree of mass-effect on the heart and the mainstem bronchus.  I asked the patient about follow-up that was done regarding his mediastinal mass.  In reviewing the records in epic, it appears that he did have a bronchoscopy that showed no malignant cells.  Follow-up surveillance CT scans were recommended.  The patient was unsure about any of this.  He did not remember having a bronchoscopy, and did not know why he was being seen at [facility], he never did follow-up there.  From a cardiac standpoint he seems to be doing well.  He was admitted in April 2021 after he missed a couple dialysis treatments.  Since then he says he has been doing well, he is compliant with his dialysis.  He does say that he has some dyspnea on exertion and some "wheezing".  The patient does not have symptoms concerning for COVID-19 infection (fever, chills, cough, or new shortness of breath).    Past Medical History:  Diagnosis Date  . A-V fistula (HCC)    Right arm  . Abdominal hernia   . AKI (acute kidney injury) ([place]) 01/2015  . Anasarca   . Anemia   . Asthma    as a child  . Bilateral inguinal hernia   . Bilateral pleural effusion 01/2017  . Cellulitis 01/16/2017   Bilateral lower extremity  . CHF (congestive heart failure) ([place])   . Chronic venous insufficiency   . Concentric left ventricular hypertrophy 08/17/2017  Moderated, noted on ECHO  . Diastolic dysfunction 65/46/5035   noted on ECHO  . Elevated LFTs    per patient, resolved  . End stage renal disease [facility])    Dialysis T/Th/Sa  Started 02/2017/pt is waiting for a kidney transplant  . H/O right inguinal hernia repair 11/2017  . History of colonoscopy 03/20/2018  . History of elevated PSA 10/2017  . Hypertension   . Pneumonia   . Pulmonary hypertension (HCC)    Moderate  . Venous stasis ulcer ([place])    bil legs/ healed up now per pt (03/06/18)   Past Surgical History:  Procedure Laterality Date  . AV FISTULA  PLACEMENT Right 01/20/2017   Procedure: ARTERIOVENOUS (AV) FISTULA CREATION;  Surgeon: Angelia Mould, MD;  Location: [facility];  Service: Vascular;  Laterality: Right;  . HERNIA REPAIR    . INSERTION OF DIALYSIS CATHETER Right 01/20/2017   Procedure: INSERTION OF DIALYSIS CATHETER;  Surgeon: Angelia Mould, MD;  Location: [place];  Service: Vascular;  Laterality: Right;  . INSERTION OF MESH N/A 11/18/2017   Procedure: INSERTION OF MESH;  Surgeon: Michael Boston, MD;  Location: [facility];  Service: General;  Laterality: N/A;  . IR FLUORO GUIDE CV LINE RIGHT  01/18/2017  . IR US GUIDE VASC ACCESS RIGHT  01/18/2017  . LAPAROSCOPIC INGUINAL HERNIA WITH UMBILICAL HERNIA Bilateral 11/18/2017   Procedure: LAPAROSCOPIC BILATERAL INGUINAL HERNIA REPAIR WITH UMBILICAL HERNIA REPAIR WITH MESH;  Surgeon: Michael Boston, MD;  Location: [facility];  Service: General;  Laterality: Bilateral;  . TOE SURGERY     right fracture in big toe  . TONSILLECTOMY       Current Meds  Medication Sig  . amLODipine (NORVASC) 10 MG tablet Take 10 mg by mouth every evening.  . carvedilol (COREG) 25 MG tablet Take 1 tablet (25 mg total) by mouth 2 (two) times daily with a meal.  . hydrALAZINE (APRESOLINE) 25 MG tablet Take 1 tablet (25 mg total) by mouth every 8 (eight) hours.  . sevelamer carbonate (RENVELA) 2.4 g PACK Take 2.4 g by mouth 3 (three) times daily with meals.     Allergies:   Lisinopril   Social History   Tobacco Use  . Smoking status: Former Smoker    Years: 17.00    Quit date: 1997    Years since quitting: 24.3  . Smokeless tobacco: Never Used  . Tobacco comment: quit smoking in 1997  Substance Use Topics  . Alcohol use: Not Currently    Comment: every several months, rare  . Drug use: Not Currently    Comment: marijuana     Family Hx: The patient's family history includes Diabetes in his father; Healthy in his brother; Heart attack (age of onset: 84) in his mother; Heart disease in his mother;  Hypertension in his mother; Prostate cancer in his maternal grandfather; Stroke in his mother. There is no history of Colon cancer, Colon polyps, Esophageal cancer, Stomach cancer, or Rectal cancer.  ROS:   Please see the history of present illness.    All other systems reviewed and are negative.   Prior CV studies:   The following studies were reviewed today: Echo Feb 2021- IMPRESSIONS    1. Left ventricular ejection fraction, by visual estimation, is 50 to  55%. The left ventricle has low normal function. There is moderately  increased left ventricular hypertrophy.  2. Elevated left ventricular end-diastolic pressure.  3. Left ventricular diastolic parameters are consistent with Grade I  diastolic dysfunction (impaired relaxation).  4. The left ventricle has no regional wall motion abnormalities.  5. Global right ventricle has normal systolic function.The right  ventricular size is normal. No increase in right ventricular wall  thickness.  6. Left atrial size was moderately dilated.  7. Right atrial size was normal.  8. The mitral valve is grossly normal. Mild mitral valve regurgitation.  9. The tricuspid valve is grossly normal.  10. The tricuspid valve is grossly normal. Tricuspid valve regurgitation  is mild-moderate.  11. The aortic valve is tricuspid. Aortic valve regurgitation is not  visualized. Very mild aortic valve stenosis.  12. The pulmonic valve was grossly normal. Pulmonic valve regurgitation is  not visualized.  13. Moderately elevated pulmonary artery systolic pressure.  14. The inferior vena cava is normal in size with greater than 50%  respiratory variability, suggesting right atrial pressure of 3 mmHg.    Labs/Other Tests and Data Reviewed:    EKG:  An ECG dated 04/27/2019 was personally reviewed today and demonstrated:  NSR, HR 64, incomplete RBBB, LAE, QTc 496  Recent Labs: 04/26/2019: Magnesium 3.2; TSH 2.089 04/27/2019: ALT 32 04/29/2019: BUN  69; Creatinine, Ser 12.17; Potassium 3.7; Sodium 136 04/30/2019: Hemoglobin 7.5; Platelets 167   Recent Lipid Panel Lab Results  Component Value Date/Time   CHOL 123 03/19/2019 02:29 PM   TRIG 61 03/19/2019 02:29 PM   HDL 49 03/19/2019 02:29 PM   CHOLHDL 2.5 03/19/2019 02:29 PM   CHOLHDL 3.9 01/31/2015 03:00 AM   LDLCALC 61 03/19/2019 02:29 PM    Wt Readings from Last 3 Encounters:  04/30/19 192 lb 3.2 oz (87.2 kg)  03/19/19 186 lb 3.2 oz (84.5 kg)  02/19/19 181 lb 6.4 oz (82.3 kg)     Objective:    Vital Signs:  Ht 6\' 2"  (1.88 m)   BMI 24.68 kg/m    VITAL SIGNS:  reviewed  ASSESSMENT & PLAN:    H/O HTN CM- His echo Feb 2021 showed preserved LVF  ESRD- HD T-Th Sat, followed at [facility]  HTN- Controlled  Mediastinal mass- This was evaluated at [facility] and was apparently a cyst. He was also noted to have a LUL nodule.  I encouraged him to contact [facility] for follow up and provided him with contact numbers.   Plan- As above - f/u Dr [name] in 6 months in the office.   COVID-19 Education: The signs and symptoms of COVID-19 were discussed with the patient and how to seek care for testing (follow up with PCP or arrange E-visit).  The importance of social distancing was discussed today.  Time:   Today, I have spent 15 minutes with the patient with telehealth technology discussing the above problems.     Medication Adjustments/Labs and Tests Ordered: Current medicines are reviewed at length with the patient today.  Concerns regarding medicines are outlined above.   Tests Ordered: No orders of the defined types were placed in this encounter.   Medication Changes: No orders of the defined types were placed in this encounter.   Follow Up:  In Person with Dr [name] in 6 months  Signed, Kerin Ransom, PA-C  05/21/2019 2:22 PM    [place]

## 2019-05-22 DIAGNOSIS — N2581 Secondary hyperparathyroidism of renal origin: Secondary | ICD-10-CM | POA: Diagnosis not present

## 2019-05-22 DIAGNOSIS — Z992 Dependence on renal dialysis: Secondary | ICD-10-CM | POA: Diagnosis not present

## 2019-05-22 DIAGNOSIS — N186 End stage renal disease: Secondary | ICD-10-CM | POA: Diagnosis not present

## 2019-05-22 DIAGNOSIS — D631 Anemia in chronic kidney disease: Secondary | ICD-10-CM | POA: Diagnosis not present

## 2019-05-22 DIAGNOSIS — D509 Iron deficiency anemia, unspecified: Secondary | ICD-10-CM | POA: Diagnosis not present

## 2019-05-24 DIAGNOSIS — D631 Anemia in chronic kidney disease: Secondary | ICD-10-CM | POA: Diagnosis not present

## 2019-05-24 DIAGNOSIS — Z992 Dependence on renal dialysis: Secondary | ICD-10-CM | POA: Diagnosis not present

## 2019-05-24 DIAGNOSIS — N186 End stage renal disease: Secondary | ICD-10-CM | POA: Diagnosis not present

## 2019-05-24 DIAGNOSIS — N2581 Secondary hyperparathyroidism of renal origin: Secondary | ICD-10-CM | POA: Diagnosis not present

## 2019-05-24 DIAGNOSIS — D509 Iron deficiency anemia, unspecified: Secondary | ICD-10-CM | POA: Diagnosis not present

## 2019-05-26 DIAGNOSIS — D631 Anemia in chronic kidney disease: Secondary | ICD-10-CM | POA: Diagnosis not present

## 2019-05-26 DIAGNOSIS — Z992 Dependence on renal dialysis: Secondary | ICD-10-CM | POA: Diagnosis not present

## 2019-05-26 DIAGNOSIS — D509 Iron deficiency anemia, unspecified: Secondary | ICD-10-CM | POA: Diagnosis not present

## 2019-05-26 DIAGNOSIS — N186 End stage renal disease: Secondary | ICD-10-CM | POA: Diagnosis not present

## 2019-05-26 DIAGNOSIS — N2581 Secondary hyperparathyroidism of renal origin: Secondary | ICD-10-CM | POA: Diagnosis not present

## 2019-05-29 DIAGNOSIS — N2581 Secondary hyperparathyroidism of renal origin: Secondary | ICD-10-CM | POA: Diagnosis not present

## 2019-05-29 DIAGNOSIS — D509 Iron deficiency anemia, unspecified: Secondary | ICD-10-CM | POA: Diagnosis not present

## 2019-05-29 DIAGNOSIS — Z992 Dependence on renal dialysis: Secondary | ICD-10-CM | POA: Diagnosis not present

## 2019-05-29 DIAGNOSIS — N186 End stage renal disease: Secondary | ICD-10-CM | POA: Diagnosis not present

## 2019-05-29 DIAGNOSIS — D631 Anemia in chronic kidney disease: Secondary | ICD-10-CM | POA: Diagnosis not present

## 2019-05-30 ENCOUNTER — Telehealth: Payer: Self-pay | Admitting: Cardiology

## 2019-05-30 NOTE — Telephone Encounter (Signed)
° ° °  Pt said he was advised by Dr. [name] that he needs to visit his pulmonologist again, he said the doctor wen to [facility] and needs a referral. He is wondering if DR. Crenshaw can send referral for him. He said he wouldn't remember the doctor's name and Dr. [name] knows since he's the one who referred him a year a go  Please advise

## 2019-05-30 NOTE — Telephone Encounter (Signed)
Pt aware appears Dr Bellinger's office should be contacting pt to schedule a ct .as well as, f/u appt Per messages in pt's chart./cy

## 2019-05-31 DIAGNOSIS — N2581 Secondary hyperparathyroidism of renal origin: Secondary | ICD-10-CM | POA: Diagnosis not present

## 2019-05-31 DIAGNOSIS — N186 End stage renal disease: Secondary | ICD-10-CM | POA: Diagnosis not present

## 2019-05-31 DIAGNOSIS — Z992 Dependence on renal dialysis: Secondary | ICD-10-CM | POA: Diagnosis not present

## 2019-05-31 DIAGNOSIS — D631 Anemia in chronic kidney disease: Secondary | ICD-10-CM | POA: Diagnosis not present

## 2019-05-31 DIAGNOSIS — D509 Iron deficiency anemia, unspecified: Secondary | ICD-10-CM | POA: Diagnosis not present

## 2019-06-02 DIAGNOSIS — N2581 Secondary hyperparathyroidism of renal origin: Secondary | ICD-10-CM | POA: Diagnosis not present

## 2019-06-02 DIAGNOSIS — D509 Iron deficiency anemia, unspecified: Secondary | ICD-10-CM | POA: Diagnosis not present

## 2019-06-02 DIAGNOSIS — D631 Anemia in chronic kidney disease: Secondary | ICD-10-CM | POA: Diagnosis not present

## 2019-06-02 DIAGNOSIS — N186 End stage renal disease: Secondary | ICD-10-CM | POA: Diagnosis not present

## 2019-06-02 DIAGNOSIS — Z992 Dependence on renal dialysis: Secondary | ICD-10-CM | POA: Diagnosis not present

## 2019-06-05 DIAGNOSIS — Z992 Dependence on renal dialysis: Secondary | ICD-10-CM | POA: Diagnosis not present

## 2019-06-05 DIAGNOSIS — N2581 Secondary hyperparathyroidism of renal origin: Secondary | ICD-10-CM | POA: Diagnosis not present

## 2019-06-05 DIAGNOSIS — D631 Anemia in chronic kidney disease: Secondary | ICD-10-CM | POA: Diagnosis not present

## 2019-06-05 DIAGNOSIS — D509 Iron deficiency anemia, unspecified: Secondary | ICD-10-CM | POA: Diagnosis not present

## 2019-06-05 DIAGNOSIS — N186 End stage renal disease: Secondary | ICD-10-CM | POA: Diagnosis not present

## 2019-06-09 DIAGNOSIS — N2581 Secondary hyperparathyroidism of renal origin: Secondary | ICD-10-CM | POA: Diagnosis not present

## 2019-06-09 DIAGNOSIS — N186 End stage renal disease: Secondary | ICD-10-CM | POA: Diagnosis not present

## 2019-06-09 DIAGNOSIS — D631 Anemia in chronic kidney disease: Secondary | ICD-10-CM | POA: Diagnosis not present

## 2019-06-09 DIAGNOSIS — Z992 Dependence on renal dialysis: Secondary | ICD-10-CM | POA: Diagnosis not present

## 2019-06-09 DIAGNOSIS — D509 Iron deficiency anemia, unspecified: Secondary | ICD-10-CM | POA: Diagnosis not present

## 2019-06-18 ENCOUNTER — Ambulatory Visit: Payer: Self-pay | Admitting: Family Medicine

## 2019-07-12 DIAGNOSIS — N186 End stage renal disease: Secondary | ICD-10-CM | POA: Diagnosis not present

## 2019-07-12 DIAGNOSIS — Z992 Dependence on renal dialysis: Secondary | ICD-10-CM | POA: Diagnosis not present

## 2019-07-12 DIAGNOSIS — I129 Hypertensive chronic kidney disease with stage 1 through stage 4 chronic kidney disease, or unspecified chronic kidney disease: Secondary | ICD-10-CM | POA: Diagnosis not present

## 2019-07-17 DIAGNOSIS — D509 Iron deficiency anemia, unspecified: Secondary | ICD-10-CM | POA: Diagnosis not present

## 2019-07-17 DIAGNOSIS — N186 End stage renal disease: Secondary | ICD-10-CM | POA: Diagnosis not present

## 2019-07-17 DIAGNOSIS — D631 Anemia in chronic kidney disease: Secondary | ICD-10-CM | POA: Diagnosis not present

## 2019-07-17 DIAGNOSIS — N2581 Secondary hyperparathyroidism of renal origin: Secondary | ICD-10-CM | POA: Diagnosis not present

## 2019-07-17 DIAGNOSIS — Z992 Dependence on renal dialysis: Secondary | ICD-10-CM | POA: Diagnosis not present

## 2019-07-19 DIAGNOSIS — D631 Anemia in chronic kidney disease: Secondary | ICD-10-CM | POA: Diagnosis not present

## 2019-07-19 DIAGNOSIS — N2581 Secondary hyperparathyroidism of renal origin: Secondary | ICD-10-CM | POA: Diagnosis not present

## 2019-07-19 DIAGNOSIS — Z992 Dependence on renal dialysis: Secondary | ICD-10-CM | POA: Diagnosis not present

## 2019-07-19 DIAGNOSIS — D509 Iron deficiency anemia, unspecified: Secondary | ICD-10-CM | POA: Diagnosis not present

## 2019-07-19 DIAGNOSIS — N186 End stage renal disease: Secondary | ICD-10-CM | POA: Diagnosis not present

## 2019-07-21 DIAGNOSIS — N186 End stage renal disease: Secondary | ICD-10-CM | POA: Diagnosis not present

## 2019-07-21 DIAGNOSIS — N2581 Secondary hyperparathyroidism of renal origin: Secondary | ICD-10-CM | POA: Diagnosis not present

## 2019-07-21 DIAGNOSIS — D631 Anemia in chronic kidney disease: Secondary | ICD-10-CM | POA: Diagnosis not present

## 2019-07-21 DIAGNOSIS — Z992 Dependence on renal dialysis: Secondary | ICD-10-CM | POA: Diagnosis not present

## 2019-07-21 DIAGNOSIS — D509 Iron deficiency anemia, unspecified: Secondary | ICD-10-CM | POA: Diagnosis not present

## 2019-07-24 DIAGNOSIS — N186 End stage renal disease: Secondary | ICD-10-CM | POA: Diagnosis not present

## 2019-07-24 DIAGNOSIS — D631 Anemia in chronic kidney disease: Secondary | ICD-10-CM | POA: Diagnosis not present

## 2019-07-24 DIAGNOSIS — Z992 Dependence on renal dialysis: Secondary | ICD-10-CM | POA: Diagnosis not present

## 2019-07-24 DIAGNOSIS — N2581 Secondary hyperparathyroidism of renal origin: Secondary | ICD-10-CM | POA: Diagnosis not present

## 2019-07-24 DIAGNOSIS — D509 Iron deficiency anemia, unspecified: Secondary | ICD-10-CM | POA: Diagnosis not present

## 2019-07-26 DIAGNOSIS — N186 End stage renal disease: Secondary | ICD-10-CM | POA: Diagnosis not present

## 2019-07-26 DIAGNOSIS — D509 Iron deficiency anemia, unspecified: Secondary | ICD-10-CM | POA: Diagnosis not present

## 2019-07-26 DIAGNOSIS — N2581 Secondary hyperparathyroidism of renal origin: Secondary | ICD-10-CM | POA: Diagnosis not present

## 2019-07-26 DIAGNOSIS — Z992 Dependence on renal dialysis: Secondary | ICD-10-CM | POA: Diagnosis not present

## 2019-07-26 DIAGNOSIS — D631 Anemia in chronic kidney disease: Secondary | ICD-10-CM | POA: Diagnosis not present

## 2019-07-28 DIAGNOSIS — D509 Iron deficiency anemia, unspecified: Secondary | ICD-10-CM | POA: Diagnosis not present

## 2019-07-28 DIAGNOSIS — D631 Anemia in chronic kidney disease: Secondary | ICD-10-CM | POA: Diagnosis not present

## 2019-07-28 DIAGNOSIS — Z992 Dependence on renal dialysis: Secondary | ICD-10-CM | POA: Diagnosis not present

## 2019-07-28 DIAGNOSIS — N2581 Secondary hyperparathyroidism of renal origin: Secondary | ICD-10-CM | POA: Diagnosis not present

## 2019-07-28 DIAGNOSIS — N186 End stage renal disease: Secondary | ICD-10-CM | POA: Diagnosis not present

## 2019-08-02 DIAGNOSIS — D631 Anemia in chronic kidney disease: Secondary | ICD-10-CM | POA: Diagnosis not present

## 2019-08-02 DIAGNOSIS — N2581 Secondary hyperparathyroidism of renal origin: Secondary | ICD-10-CM | POA: Diagnosis not present

## 2019-08-02 DIAGNOSIS — D509 Iron deficiency anemia, unspecified: Secondary | ICD-10-CM | POA: Diagnosis not present

## 2019-08-02 DIAGNOSIS — N186 End stage renal disease: Secondary | ICD-10-CM | POA: Diagnosis not present

## 2019-08-02 DIAGNOSIS — Z992 Dependence on renal dialysis: Secondary | ICD-10-CM | POA: Diagnosis not present

## 2019-08-04 DIAGNOSIS — N2581 Secondary hyperparathyroidism of renal origin: Secondary | ICD-10-CM | POA: Diagnosis not present

## 2019-08-04 DIAGNOSIS — D509 Iron deficiency anemia, unspecified: Secondary | ICD-10-CM | POA: Diagnosis not present

## 2019-08-04 DIAGNOSIS — N186 End stage renal disease: Secondary | ICD-10-CM | POA: Diagnosis not present

## 2019-08-04 DIAGNOSIS — Z992 Dependence on renal dialysis: Secondary | ICD-10-CM | POA: Diagnosis not present

## 2019-08-04 DIAGNOSIS — D631 Anemia in chronic kidney disease: Secondary | ICD-10-CM | POA: Diagnosis not present

## 2019-08-07 DIAGNOSIS — D631 Anemia in chronic kidney disease: Secondary | ICD-10-CM | POA: Diagnosis not present

## 2019-08-07 DIAGNOSIS — D509 Iron deficiency anemia, unspecified: Secondary | ICD-10-CM | POA: Diagnosis not present

## 2019-08-07 DIAGNOSIS — N186 End stage renal disease: Secondary | ICD-10-CM | POA: Diagnosis not present

## 2019-08-07 DIAGNOSIS — Z992 Dependence on renal dialysis: Secondary | ICD-10-CM | POA: Diagnosis not present

## 2019-08-07 DIAGNOSIS — N2581 Secondary hyperparathyroidism of renal origin: Secondary | ICD-10-CM | POA: Diagnosis not present

## 2019-08-09 DIAGNOSIS — N2581 Secondary hyperparathyroidism of renal origin: Secondary | ICD-10-CM | POA: Diagnosis not present

## 2019-08-09 DIAGNOSIS — N186 End stage renal disease: Secondary | ICD-10-CM | POA: Diagnosis not present

## 2019-08-09 DIAGNOSIS — D509 Iron deficiency anemia, unspecified: Secondary | ICD-10-CM | POA: Diagnosis not present

## 2019-08-09 DIAGNOSIS — Z992 Dependence on renal dialysis: Secondary | ICD-10-CM | POA: Diagnosis not present

## 2019-08-09 DIAGNOSIS — D631 Anemia in chronic kidney disease: Secondary | ICD-10-CM | POA: Diagnosis not present

## 2019-08-11 DIAGNOSIS — Z992 Dependence on renal dialysis: Secondary | ICD-10-CM | POA: Diagnosis not present

## 2019-08-11 DIAGNOSIS — D631 Anemia in chronic kidney disease: Secondary | ICD-10-CM | POA: Diagnosis not present

## 2019-08-11 DIAGNOSIS — N186 End stage renal disease: Secondary | ICD-10-CM | POA: Diagnosis not present

## 2019-08-11 DIAGNOSIS — N2581 Secondary hyperparathyroidism of renal origin: Secondary | ICD-10-CM | POA: Diagnosis not present

## 2019-08-11 DIAGNOSIS — D509 Iron deficiency anemia, unspecified: Secondary | ICD-10-CM | POA: Diagnosis not present

## 2019-08-12 DIAGNOSIS — Z992 Dependence on renal dialysis: Secondary | ICD-10-CM | POA: Diagnosis not present

## 2019-08-12 DIAGNOSIS — I129 Hypertensive chronic kidney disease with stage 1 through stage 4 chronic kidney disease, or unspecified chronic kidney disease: Secondary | ICD-10-CM | POA: Diagnosis not present

## 2019-08-12 DIAGNOSIS — N186 End stage renal disease: Secondary | ICD-10-CM | POA: Diagnosis not present

## 2019-08-14 DIAGNOSIS — D631 Anemia in chronic kidney disease: Secondary | ICD-10-CM | POA: Diagnosis not present

## 2019-08-14 DIAGNOSIS — N186 End stage renal disease: Secondary | ICD-10-CM | POA: Diagnosis not present

## 2019-08-14 DIAGNOSIS — N2581 Secondary hyperparathyroidism of renal origin: Secondary | ICD-10-CM | POA: Diagnosis not present

## 2019-08-14 DIAGNOSIS — Z992 Dependence on renal dialysis: Secondary | ICD-10-CM | POA: Diagnosis not present

## 2019-08-14 DIAGNOSIS — D509 Iron deficiency anemia, unspecified: Secondary | ICD-10-CM | POA: Diagnosis not present

## 2019-08-16 DIAGNOSIS — N2581 Secondary hyperparathyroidism of renal origin: Secondary | ICD-10-CM | POA: Diagnosis not present

## 2019-08-16 DIAGNOSIS — D509 Iron deficiency anemia, unspecified: Secondary | ICD-10-CM | POA: Diagnosis not present

## 2019-08-16 DIAGNOSIS — D631 Anemia in chronic kidney disease: Secondary | ICD-10-CM | POA: Diagnosis not present

## 2019-08-16 DIAGNOSIS — Z992 Dependence on renal dialysis: Secondary | ICD-10-CM | POA: Diagnosis not present

## 2019-08-16 DIAGNOSIS — N186 End stage renal disease: Secondary | ICD-10-CM | POA: Diagnosis not present

## 2019-08-21 DIAGNOSIS — N186 End stage renal disease: Secondary | ICD-10-CM | POA: Diagnosis not present

## 2019-08-21 DIAGNOSIS — N2581 Secondary hyperparathyroidism of renal origin: Secondary | ICD-10-CM | POA: Diagnosis not present

## 2019-08-21 DIAGNOSIS — D631 Anemia in chronic kidney disease: Secondary | ICD-10-CM | POA: Diagnosis not present

## 2019-08-21 DIAGNOSIS — D509 Iron deficiency anemia, unspecified: Secondary | ICD-10-CM | POA: Diagnosis not present

## 2019-08-21 DIAGNOSIS — Z992 Dependence on renal dialysis: Secondary | ICD-10-CM | POA: Diagnosis not present

## 2019-08-23 DIAGNOSIS — D631 Anemia in chronic kidney disease: Secondary | ICD-10-CM | POA: Diagnosis not present

## 2019-08-23 DIAGNOSIS — N2581 Secondary hyperparathyroidism of renal origin: Secondary | ICD-10-CM | POA: Diagnosis not present

## 2019-08-23 DIAGNOSIS — Z992 Dependence on renal dialysis: Secondary | ICD-10-CM | POA: Diagnosis not present

## 2019-08-23 DIAGNOSIS — D509 Iron deficiency anemia, unspecified: Secondary | ICD-10-CM | POA: Diagnosis not present

## 2019-08-23 DIAGNOSIS — N186 End stage renal disease: Secondary | ICD-10-CM | POA: Diagnosis not present

## 2019-08-25 DIAGNOSIS — N186 End stage renal disease: Secondary | ICD-10-CM | POA: Diagnosis not present

## 2019-08-25 DIAGNOSIS — Z992 Dependence on renal dialysis: Secondary | ICD-10-CM | POA: Diagnosis not present

## 2019-08-25 DIAGNOSIS — D509 Iron deficiency anemia, unspecified: Secondary | ICD-10-CM | POA: Diagnosis not present

## 2019-08-25 DIAGNOSIS — D631 Anemia in chronic kidney disease: Secondary | ICD-10-CM | POA: Diagnosis not present

## 2019-08-25 DIAGNOSIS — N2581 Secondary hyperparathyroidism of renal origin: Secondary | ICD-10-CM | POA: Diagnosis not present

## 2019-08-30 DIAGNOSIS — N186 End stage renal disease: Secondary | ICD-10-CM | POA: Diagnosis not present

## 2019-08-30 DIAGNOSIS — D509 Iron deficiency anemia, unspecified: Secondary | ICD-10-CM | POA: Diagnosis not present

## 2019-08-30 DIAGNOSIS — D631 Anemia in chronic kidney disease: Secondary | ICD-10-CM | POA: Diagnosis not present

## 2019-08-30 DIAGNOSIS — N2581 Secondary hyperparathyroidism of renal origin: Secondary | ICD-10-CM | POA: Diagnosis not present

## 2019-08-30 DIAGNOSIS — Z992 Dependence on renal dialysis: Secondary | ICD-10-CM | POA: Diagnosis not present

## 2019-08-30 DIAGNOSIS — R05 Cough: Secondary | ICD-10-CM | POA: Diagnosis not present

## 2019-09-01 DIAGNOSIS — D631 Anemia in chronic kidney disease: Secondary | ICD-10-CM | POA: Diagnosis not present

## 2019-09-01 DIAGNOSIS — N186 End stage renal disease: Secondary | ICD-10-CM | POA: Diagnosis not present

## 2019-09-01 DIAGNOSIS — D509 Iron deficiency anemia, unspecified: Secondary | ICD-10-CM | POA: Diagnosis not present

## 2019-09-01 DIAGNOSIS — Z992 Dependence on renal dialysis: Secondary | ICD-10-CM | POA: Diagnosis not present

## 2019-09-01 DIAGNOSIS — N2581 Secondary hyperparathyroidism of renal origin: Secondary | ICD-10-CM | POA: Diagnosis not present

## 2019-09-04 DIAGNOSIS — N186 End stage renal disease: Secondary | ICD-10-CM | POA: Diagnosis not present

## 2019-09-04 DIAGNOSIS — Z992 Dependence on renal dialysis: Secondary | ICD-10-CM | POA: Diagnosis not present

## 2019-09-04 DIAGNOSIS — D509 Iron deficiency anemia, unspecified: Secondary | ICD-10-CM | POA: Diagnosis not present

## 2019-09-04 DIAGNOSIS — D631 Anemia in chronic kidney disease: Secondary | ICD-10-CM | POA: Diagnosis not present

## 2019-09-04 DIAGNOSIS — N2581 Secondary hyperparathyroidism of renal origin: Secondary | ICD-10-CM | POA: Diagnosis not present

## 2019-09-06 DIAGNOSIS — N186 End stage renal disease: Secondary | ICD-10-CM | POA: Diagnosis not present

## 2019-09-06 DIAGNOSIS — D509 Iron deficiency anemia, unspecified: Secondary | ICD-10-CM | POA: Diagnosis not present

## 2019-09-06 DIAGNOSIS — N2581 Secondary hyperparathyroidism of renal origin: Secondary | ICD-10-CM | POA: Diagnosis not present

## 2019-09-06 DIAGNOSIS — D631 Anemia in chronic kidney disease: Secondary | ICD-10-CM | POA: Diagnosis not present

## 2019-09-06 DIAGNOSIS — Z992 Dependence on renal dialysis: Secondary | ICD-10-CM | POA: Diagnosis not present

## 2019-09-08 DIAGNOSIS — Z992 Dependence on renal dialysis: Secondary | ICD-10-CM | POA: Diagnosis not present

## 2019-09-08 DIAGNOSIS — N2581 Secondary hyperparathyroidism of renal origin: Secondary | ICD-10-CM | POA: Diagnosis not present

## 2019-09-08 DIAGNOSIS — N186 End stage renal disease: Secondary | ICD-10-CM | POA: Diagnosis not present

## 2019-09-08 DIAGNOSIS — D509 Iron deficiency anemia, unspecified: Secondary | ICD-10-CM | POA: Diagnosis not present

## 2019-09-08 DIAGNOSIS — D631 Anemia in chronic kidney disease: Secondary | ICD-10-CM | POA: Diagnosis not present

## 2019-09-11 DIAGNOSIS — D631 Anemia in chronic kidney disease: Secondary | ICD-10-CM | POA: Diagnosis not present

## 2019-09-11 DIAGNOSIS — Z992 Dependence on renal dialysis: Secondary | ICD-10-CM | POA: Diagnosis not present

## 2019-09-11 DIAGNOSIS — N2581 Secondary hyperparathyroidism of renal origin: Secondary | ICD-10-CM | POA: Diagnosis not present

## 2019-09-11 DIAGNOSIS — D509 Iron deficiency anemia, unspecified: Secondary | ICD-10-CM | POA: Diagnosis not present

## 2019-09-11 DIAGNOSIS — N186 End stage renal disease: Secondary | ICD-10-CM | POA: Diagnosis not present

## 2019-09-12 DIAGNOSIS — N186 End stage renal disease: Secondary | ICD-10-CM | POA: Diagnosis not present

## 2019-09-12 DIAGNOSIS — I129 Hypertensive chronic kidney disease with stage 1 through stage 4 chronic kidney disease, or unspecified chronic kidney disease: Secondary | ICD-10-CM | POA: Diagnosis not present

## 2019-09-12 DIAGNOSIS — Z992 Dependence on renal dialysis: Secondary | ICD-10-CM | POA: Diagnosis not present

## 2019-09-15 DIAGNOSIS — Z992 Dependence on renal dialysis: Secondary | ICD-10-CM | POA: Diagnosis not present

## 2019-09-15 DIAGNOSIS — N186 End stage renal disease: Secondary | ICD-10-CM | POA: Diagnosis not present

## 2019-09-15 DIAGNOSIS — D631 Anemia in chronic kidney disease: Secondary | ICD-10-CM | POA: Diagnosis not present

## 2019-09-15 DIAGNOSIS — D509 Iron deficiency anemia, unspecified: Secondary | ICD-10-CM | POA: Diagnosis not present

## 2019-09-15 DIAGNOSIS — N2581 Secondary hyperparathyroidism of renal origin: Secondary | ICD-10-CM | POA: Diagnosis not present

## 2019-09-18 DIAGNOSIS — Z992 Dependence on renal dialysis: Secondary | ICD-10-CM | POA: Diagnosis not present

## 2019-09-18 DIAGNOSIS — D631 Anemia in chronic kidney disease: Secondary | ICD-10-CM | POA: Diagnosis not present

## 2019-09-18 DIAGNOSIS — N2581 Secondary hyperparathyroidism of renal origin: Secondary | ICD-10-CM | POA: Diagnosis not present

## 2019-09-18 DIAGNOSIS — D509 Iron deficiency anemia, unspecified: Secondary | ICD-10-CM | POA: Diagnosis not present

## 2019-09-18 DIAGNOSIS — N186 End stage renal disease: Secondary | ICD-10-CM | POA: Diagnosis not present

## 2019-09-21 DIAGNOSIS — Z992 Dependence on renal dialysis: Secondary | ICD-10-CM | POA: Diagnosis not present

## 2019-09-21 DIAGNOSIS — N186 End stage renal disease: Secondary | ICD-10-CM | POA: Diagnosis not present

## 2019-09-21 DIAGNOSIS — D631 Anemia in chronic kidney disease: Secondary | ICD-10-CM | POA: Diagnosis not present

## 2019-09-21 DIAGNOSIS — D509 Iron deficiency anemia, unspecified: Secondary | ICD-10-CM | POA: Diagnosis not present

## 2019-09-21 DIAGNOSIS — N2581 Secondary hyperparathyroidism of renal origin: Secondary | ICD-10-CM | POA: Diagnosis not present

## 2019-09-22 DIAGNOSIS — N2581 Secondary hyperparathyroidism of renal origin: Secondary | ICD-10-CM | POA: Diagnosis not present

## 2019-09-22 DIAGNOSIS — Z992 Dependence on renal dialysis: Secondary | ICD-10-CM | POA: Diagnosis not present

## 2019-09-22 DIAGNOSIS — D631 Anemia in chronic kidney disease: Secondary | ICD-10-CM | POA: Diagnosis not present

## 2019-09-22 DIAGNOSIS — N186 End stage renal disease: Secondary | ICD-10-CM | POA: Diagnosis not present

## 2019-09-22 DIAGNOSIS — D509 Iron deficiency anemia, unspecified: Secondary | ICD-10-CM | POA: Diagnosis not present

## 2019-09-25 DIAGNOSIS — Z992 Dependence on renal dialysis: Secondary | ICD-10-CM | POA: Diagnosis not present

## 2019-09-25 DIAGNOSIS — D509 Iron deficiency anemia, unspecified: Secondary | ICD-10-CM | POA: Diagnosis not present

## 2019-09-25 DIAGNOSIS — N2581 Secondary hyperparathyroidism of renal origin: Secondary | ICD-10-CM | POA: Diagnosis not present

## 2019-09-25 DIAGNOSIS — N186 End stage renal disease: Secondary | ICD-10-CM | POA: Diagnosis not present

## 2019-09-25 DIAGNOSIS — D631 Anemia in chronic kidney disease: Secondary | ICD-10-CM | POA: Diagnosis not present

## 2019-09-27 DIAGNOSIS — D631 Anemia in chronic kidney disease: Secondary | ICD-10-CM | POA: Diagnosis not present

## 2019-09-27 DIAGNOSIS — N2581 Secondary hyperparathyroidism of renal origin: Secondary | ICD-10-CM | POA: Diagnosis not present

## 2019-09-27 DIAGNOSIS — D509 Iron deficiency anemia, unspecified: Secondary | ICD-10-CM | POA: Diagnosis not present

## 2019-09-27 DIAGNOSIS — N186 End stage renal disease: Secondary | ICD-10-CM | POA: Diagnosis not present

## 2019-09-27 DIAGNOSIS — Z992 Dependence on renal dialysis: Secondary | ICD-10-CM | POA: Diagnosis not present

## 2019-10-01 DIAGNOSIS — D509 Iron deficiency anemia, unspecified: Secondary | ICD-10-CM | POA: Diagnosis not present

## 2019-10-01 DIAGNOSIS — Z992 Dependence on renal dialysis: Secondary | ICD-10-CM | POA: Diagnosis not present

## 2019-10-01 DIAGNOSIS — D631 Anemia in chronic kidney disease: Secondary | ICD-10-CM | POA: Diagnosis not present

## 2019-10-01 DIAGNOSIS — N2581 Secondary hyperparathyroidism of renal origin: Secondary | ICD-10-CM | POA: Diagnosis not present

## 2019-10-01 DIAGNOSIS — N186 End stage renal disease: Secondary | ICD-10-CM | POA: Diagnosis not present

## 2019-10-04 DIAGNOSIS — D631 Anemia in chronic kidney disease: Secondary | ICD-10-CM | POA: Diagnosis not present

## 2019-10-04 DIAGNOSIS — D509 Iron deficiency anemia, unspecified: Secondary | ICD-10-CM | POA: Diagnosis not present

## 2019-10-04 DIAGNOSIS — Z992 Dependence on renal dialysis: Secondary | ICD-10-CM | POA: Diagnosis not present

## 2019-10-04 DIAGNOSIS — N186 End stage renal disease: Secondary | ICD-10-CM | POA: Diagnosis not present

## 2019-10-04 DIAGNOSIS — N2581 Secondary hyperparathyroidism of renal origin: Secondary | ICD-10-CM | POA: Diagnosis not present

## 2019-10-06 DIAGNOSIS — D631 Anemia in chronic kidney disease: Secondary | ICD-10-CM | POA: Diagnosis not present

## 2019-10-06 DIAGNOSIS — N186 End stage renal disease: Secondary | ICD-10-CM | POA: Diagnosis not present

## 2019-10-06 DIAGNOSIS — N2581 Secondary hyperparathyroidism of renal origin: Secondary | ICD-10-CM | POA: Diagnosis not present

## 2019-10-06 DIAGNOSIS — Z992 Dependence on renal dialysis: Secondary | ICD-10-CM | POA: Diagnosis not present

## 2019-10-06 DIAGNOSIS — D509 Iron deficiency anemia, unspecified: Secondary | ICD-10-CM | POA: Diagnosis not present

## 2019-10-09 DIAGNOSIS — D509 Iron deficiency anemia, unspecified: Secondary | ICD-10-CM | POA: Diagnosis not present

## 2019-10-09 DIAGNOSIS — Z992 Dependence on renal dialysis: Secondary | ICD-10-CM | POA: Diagnosis not present

## 2019-10-09 DIAGNOSIS — D631 Anemia in chronic kidney disease: Secondary | ICD-10-CM | POA: Diagnosis not present

## 2019-10-09 DIAGNOSIS — N2581 Secondary hyperparathyroidism of renal origin: Secondary | ICD-10-CM | POA: Diagnosis not present

## 2019-10-09 DIAGNOSIS — N186 End stage renal disease: Secondary | ICD-10-CM | POA: Diagnosis not present

## 2019-10-11 DIAGNOSIS — N2581 Secondary hyperparathyroidism of renal origin: Secondary | ICD-10-CM | POA: Diagnosis not present

## 2019-10-11 DIAGNOSIS — D509 Iron deficiency anemia, unspecified: Secondary | ICD-10-CM | POA: Diagnosis not present

## 2019-10-11 DIAGNOSIS — N186 End stage renal disease: Secondary | ICD-10-CM | POA: Diagnosis not present

## 2019-10-11 DIAGNOSIS — Z992 Dependence on renal dialysis: Secondary | ICD-10-CM | POA: Diagnosis not present

## 2019-10-11 DIAGNOSIS — D631 Anemia in chronic kidney disease: Secondary | ICD-10-CM | POA: Diagnosis not present

## 2019-10-12 DIAGNOSIS — Z992 Dependence on renal dialysis: Secondary | ICD-10-CM | POA: Diagnosis not present

## 2019-10-12 DIAGNOSIS — I129 Hypertensive chronic kidney disease with stage 1 through stage 4 chronic kidney disease, or unspecified chronic kidney disease: Secondary | ICD-10-CM | POA: Diagnosis not present

## 2019-10-12 DIAGNOSIS — N186 End stage renal disease: Secondary | ICD-10-CM | POA: Diagnosis not present

## 2019-10-16 DIAGNOSIS — D631 Anemia in chronic kidney disease: Secondary | ICD-10-CM | POA: Diagnosis not present

## 2019-10-16 DIAGNOSIS — N186 End stage renal disease: Secondary | ICD-10-CM | POA: Diagnosis not present

## 2019-10-16 DIAGNOSIS — Z992 Dependence on renal dialysis: Secondary | ICD-10-CM | POA: Diagnosis not present

## 2019-10-16 DIAGNOSIS — N2581 Secondary hyperparathyroidism of renal origin: Secondary | ICD-10-CM | POA: Diagnosis not present

## 2019-10-16 DIAGNOSIS — D509 Iron deficiency anemia, unspecified: Secondary | ICD-10-CM | POA: Diagnosis not present

## 2019-10-18 DIAGNOSIS — Z992 Dependence on renal dialysis: Secondary | ICD-10-CM | POA: Diagnosis not present

## 2019-10-18 DIAGNOSIS — D509 Iron deficiency anemia, unspecified: Secondary | ICD-10-CM | POA: Diagnosis not present

## 2019-10-18 DIAGNOSIS — N2581 Secondary hyperparathyroidism of renal origin: Secondary | ICD-10-CM | POA: Diagnosis not present

## 2019-10-18 DIAGNOSIS — D631 Anemia in chronic kidney disease: Secondary | ICD-10-CM | POA: Diagnosis not present

## 2019-10-18 DIAGNOSIS — N186 End stage renal disease: Secondary | ICD-10-CM | POA: Diagnosis not present

## 2019-10-20 DIAGNOSIS — Z992 Dependence on renal dialysis: Secondary | ICD-10-CM | POA: Diagnosis not present

## 2019-10-20 DIAGNOSIS — N2581 Secondary hyperparathyroidism of renal origin: Secondary | ICD-10-CM | POA: Diagnosis not present

## 2019-10-20 DIAGNOSIS — D631 Anemia in chronic kidney disease: Secondary | ICD-10-CM | POA: Diagnosis not present

## 2019-10-20 DIAGNOSIS — D509 Iron deficiency anemia, unspecified: Secondary | ICD-10-CM | POA: Diagnosis not present

## 2019-10-20 DIAGNOSIS — N186 End stage renal disease: Secondary | ICD-10-CM | POA: Diagnosis not present

## 2019-10-24 DIAGNOSIS — N186 End stage renal disease: Secondary | ICD-10-CM | POA: Diagnosis not present

## 2019-10-24 DIAGNOSIS — D631 Anemia in chronic kidney disease: Secondary | ICD-10-CM | POA: Diagnosis not present

## 2019-10-24 DIAGNOSIS — D509 Iron deficiency anemia, unspecified: Secondary | ICD-10-CM | POA: Diagnosis not present

## 2019-10-24 DIAGNOSIS — Z992 Dependence on renal dialysis: Secondary | ICD-10-CM | POA: Diagnosis not present

## 2019-10-24 DIAGNOSIS — N2581 Secondary hyperparathyroidism of renal origin: Secondary | ICD-10-CM | POA: Diagnosis not present

## 2019-10-30 ENCOUNTER — Emergency Department (HOSPITAL_COMMUNITY): Payer: Medicare Other

## 2019-10-30 ENCOUNTER — Observation Stay (HOSPITAL_COMMUNITY): Payer: Medicare Other

## 2019-10-30 ENCOUNTER — Encounter (HOSPITAL_COMMUNITY): Payer: Self-pay | Admitting: Internal Medicine

## 2019-10-30 ENCOUNTER — Inpatient Hospital Stay (HOSPITAL_COMMUNITY)
Admission: EM | Admit: 2019-10-30 | Discharge: 2019-11-03 | DRG: 304 | Disposition: A | Discharge status: Home / Self Care | Payer: Medicare Other | Source: Other Acute Inpatient Hospital | Attending: Family Medicine | Admitting: Family Medicine

## 2019-10-30 DIAGNOSIS — R410 Disorientation, unspecified: Secondary | ICD-10-CM | POA: Diagnosis not present

## 2019-10-30 DIAGNOSIS — E7220 Disorder of urea cycle metabolism, unspecified: Secondary | ICD-10-CM | POA: Diagnosis present

## 2019-10-30 DIAGNOSIS — D638 Anemia in other chronic diseases classified elsewhere: Secondary | ICD-10-CM | POA: Diagnosis not present

## 2019-10-30 DIAGNOSIS — I10 Essential (primary) hypertension: Secondary | ICD-10-CM | POA: Diagnosis not present

## 2019-10-30 DIAGNOSIS — N2581 Secondary hyperparathyroidism of renal origin: Secondary | ICD-10-CM | POA: Diagnosis not present

## 2019-10-30 DIAGNOSIS — Z79899 Other long term (current) drug therapy: Secondary | ICD-10-CM

## 2019-10-30 DIAGNOSIS — I517 Cardiomegaly: Secondary | ICD-10-CM | POA: Diagnosis not present

## 2019-10-30 DIAGNOSIS — R29810 Facial weakness: Secondary | ICD-10-CM | POA: Diagnosis not present

## 2019-10-30 DIAGNOSIS — I674 Hypertensive encephalopathy: Secondary | ICD-10-CM | POA: Diagnosis not present

## 2019-10-30 DIAGNOSIS — R4701 Aphasia: Secondary | ICD-10-CM | POA: Diagnosis not present

## 2019-10-30 DIAGNOSIS — D631 Anemia in chronic kidney disease: Secondary | ICD-10-CM | POA: Diagnosis not present

## 2019-10-30 DIAGNOSIS — I248 Other forms of acute ischemic heart disease: Secondary | ICD-10-CM | POA: Diagnosis present

## 2019-10-30 DIAGNOSIS — Z823 Family history of stroke: Secondary | ICD-10-CM

## 2019-10-30 DIAGNOSIS — G9340 Encephalopathy, unspecified: Secondary | ICD-10-CM

## 2019-10-30 DIAGNOSIS — I132 Hypertensive heart and chronic kidney disease with heart failure and with stage 5 chronic kidney disease, or end stage renal disease: Secondary | ICD-10-CM | POA: Diagnosis present

## 2019-10-30 DIAGNOSIS — J984 Other disorders of lung: Secondary | ICD-10-CM | POA: Diagnosis present

## 2019-10-30 DIAGNOSIS — I5032 Chronic diastolic (congestive) heart failure: Secondary | ICD-10-CM

## 2019-10-30 DIAGNOSIS — R4781 Slurred speech: Secondary | ICD-10-CM | POA: Diagnosis not present

## 2019-10-30 DIAGNOSIS — Z833 Family history of diabetes mellitus: Secondary | ICD-10-CM

## 2019-10-30 DIAGNOSIS — Z87891 Personal history of nicotine dependence: Secondary | ICD-10-CM

## 2019-10-30 DIAGNOSIS — I43 Cardiomyopathy in diseases classified elsewhere: Secondary | ICD-10-CM | POA: Diagnosis present

## 2019-10-30 DIAGNOSIS — Z20822 Contact with and (suspected) exposure to covid-19: Secondary | ICD-10-CM | POA: Diagnosis present

## 2019-10-30 DIAGNOSIS — I161 Hypertensive emergency: Secondary | ICD-10-CM | POA: Diagnosis not present

## 2019-10-30 DIAGNOSIS — Z992 Dependence on renal dialysis: Secondary | ICD-10-CM | POA: Diagnosis not present

## 2019-10-30 DIAGNOSIS — J811 Chronic pulmonary edema: Secondary | ICD-10-CM | POA: Diagnosis not present

## 2019-10-30 DIAGNOSIS — R778 Other specified abnormalities of plasma proteins: Secondary | ICD-10-CM | POA: Diagnosis not present

## 2019-10-30 DIAGNOSIS — R404 Transient alteration of awareness: Secondary | ICD-10-CM | POA: Diagnosis not present

## 2019-10-30 DIAGNOSIS — R590 Localized enlarged lymph nodes: Secondary | ICD-10-CM | POA: Diagnosis present

## 2019-10-30 DIAGNOSIS — J9859 Other diseases of mediastinum, not elsewhere classified: Secondary | ICD-10-CM | POA: Diagnosis not present

## 2019-10-30 DIAGNOSIS — I5042 Chronic combined systolic (congestive) and diastolic (congestive) heart failure: Secondary | ICD-10-CM

## 2019-10-30 DIAGNOSIS — N186 End stage renal disease: Secondary | ICD-10-CM

## 2019-10-30 DIAGNOSIS — E8770 Fluid overload, unspecified: Secondary | ICD-10-CM | POA: Diagnosis present

## 2019-10-30 DIAGNOSIS — R0902 Hypoxemia: Secondary | ICD-10-CM | POA: Diagnosis not present

## 2019-10-30 DIAGNOSIS — Z8249 Family history of ischemic heart disease and other diseases of the circulatory system: Secondary | ICD-10-CM

## 2019-10-30 DIAGNOSIS — I6523 Occlusion and stenosis of bilateral carotid arteries: Secondary | ICD-10-CM | POA: Diagnosis not present

## 2019-10-30 DIAGNOSIS — R29818 Other symptoms and signs involving the nervous system: Secondary | ICD-10-CM | POA: Diagnosis not present

## 2019-10-30 DIAGNOSIS — I169 Hypertensive crisis, unspecified: Secondary | ICD-10-CM

## 2019-10-30 DIAGNOSIS — J439 Emphysema, unspecified: Secondary | ICD-10-CM | POA: Diagnosis present

## 2019-10-30 DIAGNOSIS — I499 Cardiac arrhythmia, unspecified: Secondary | ICD-10-CM | POA: Diagnosis not present

## 2019-10-30 DIAGNOSIS — D509 Iron deficiency anemia, unspecified: Secondary | ICD-10-CM | POA: Diagnosis not present

## 2019-10-30 LAB — DIFFERENTIAL
Abs Immature Granulocytes: 0.01 10*3/uL (ref 0.00–0.07)
Basophils Absolute: 0 10*3/uL (ref 0.0–0.1)
Basophils Relative: 1 %
Eosinophils Absolute: 0.3 10*3/uL (ref 0.0–0.5)
Eosinophils Relative: 7 %
Immature Granulocytes: 0 %
Lymphocytes Relative: 10 %
Lymphs Abs: 0.4 10*3/uL — ABNORMAL LOW (ref 0.7–4.0)
Monocytes Absolute: 0.3 10*3/uL (ref 0.1–1.0)
Monocytes Relative: 7 %
Neutro Abs: 2.8 10*3/uL (ref 1.7–7.7)
Neutrophils Relative %: 75 %

## 2019-10-30 LAB — CBC
HCT: 31.3 % — ABNORMAL LOW (ref 39.0–52.0)
Hemoglobin: 10.4 g/dL — ABNORMAL LOW (ref 13.0–17.0)
MCH: 29.9 pg (ref 26.0–34.0)
MCHC: 33.2 g/dL (ref 30.0–36.0)
MCV: 89.9 fL (ref 80.0–100.0)
Platelets: 186 10*3/uL (ref 150–400)
RBC: 3.48 MIL/uL — ABNORMAL LOW (ref 4.22–5.81)
RDW: 12.8 % (ref 11.5–15.5)
WBC: 3.8 10*3/uL — ABNORMAL LOW (ref 4.0–10.5)
nRBC: 0 % (ref 0.0–0.2)

## 2019-10-30 LAB — URINALYSIS, ROUTINE W REFLEX MICROSCOPIC
Bacteria, UA: NONE SEEN
Bilirubin Urine: NEGATIVE
Glucose, UA: 50 mg/dL — AB
Hgb urine dipstick: NEGATIVE
Ketones, ur: 5 mg/dL — AB
Leukocytes,Ua: NEGATIVE
Nitrite: NEGATIVE
Protein, ur: >=300 mg/dL — AB
Specific Gravity, Urine: 1.013 (ref 1.005–1.030)
pH: 7 (ref 5.0–8.0)

## 2019-10-30 LAB — TROPONIN I (HIGH SENSITIVITY)
Troponin I (High Sensitivity): 145 ng/L (ref <18)
Troponin I (High Sensitivity): 144 ng/L (ref <18)

## 2019-10-30 LAB — COMPREHENSIVE METABOLIC PANEL
ALT: 17 U/L (ref 0–44)
AST: 16 U/L (ref 15–41)
Albumin: 4.2 g/dL (ref 3.5–5.0)
Alkaline Phosphatase: 32 U/L — ABNORMAL LOW (ref 38–126)
Anion gap: 17 — ABNORMAL HIGH (ref 5–15)
BUN: 50 mg/dL — ABNORMAL HIGH (ref 6–20)
CO2: 26 mmol/L (ref 22–32)
Calcium: 10.6 mg/dL — ABNORMAL HIGH (ref 8.9–10.3)
Chloride: 95 mmol/L — ABNORMAL LOW (ref 98–111)
Creatinine, Ser: 8.65 mg/dL — ABNORMAL HIGH (ref 0.61–1.24)
GFR, Estimated: 6 mL/min — ABNORMAL LOW (ref >60)
Glucose, Bld: 88 mg/dL (ref 70–99)
Potassium: 3.9 mmol/L (ref 3.5–5.1)
Sodium: 138 mmol/L (ref 135–145)
Total Bilirubin: 1.5 mg/dL — ABNORMAL HIGH (ref 0.3–1.2)
Total Protein: 7.5 g/dL (ref 6.5–8.1)

## 2019-10-30 LAB — PROTIME-INR
INR: 1.2 (ref 0.8–1.2)
Prothrombin Time: 14.3 seconds (ref 11.4–15.2)

## 2019-10-30 LAB — APTT: aPTT: 34 seconds (ref 24–36)

## 2019-10-30 LAB — I-STAT CHEM 8, ED
BUN: 51 mg/dL — ABNORMAL HIGH (ref 6–20)
Calcium, Ion: 1.18 mmol/L (ref 1.15–1.40)
Chloride: 97 mmol/L — ABNORMAL LOW (ref 98–111)
Creatinine, Ser: 9.3 mg/dL — ABNORMAL HIGH (ref 0.61–1.24)
Glucose, Bld: 86 mg/dL (ref 70–99)
HCT: 31 % — ABNORMAL LOW (ref 39.0–52.0)
Hemoglobin: 10.5 g/dL — ABNORMAL LOW (ref 13.0–17.0)
Potassium: 3.8 mmol/L (ref 3.5–5.1)
Sodium: 136 mmol/L (ref 135–145)
TCO2: 29 mmol/L (ref 22–32)

## 2019-10-30 LAB — TSH: TSH: 1.826 u[IU]/mL (ref 0.350–4.500)

## 2019-10-30 LAB — RESP PANEL BY RT PCR (RSV, FLU A&B, COVID)
Influenza A by PCR: NEGATIVE
Influenza B by PCR: NEGATIVE
Respiratory Syncytial Virus by PCR: NEGATIVE
SARS Coronavirus 2 by RT PCR: NEGATIVE

## 2019-10-30 LAB — AMMONIA: Ammonia: 37 umol/L — ABNORMAL HIGH (ref 9–35)

## 2019-10-30 LAB — ETHANOL: Alcohol, Ethyl (B): <10 mg/dL (ref <10)

## 2019-10-30 IMAGING — MR MR HEAD W/O CM
3 series · 48 of 48 positions shown · non-contrast
Comparison: CT head, CTA and CTP today.

CLINICAL DATA: 59-year-old male code stroke presentation today.
Aphasia.

EXAM:
MRI HEAD WITHOUT CONTRAST
TECHNIQUE: Multiplanar, multiecho pulse sequences of the brain and surrounding
structures were obtained without intravenous contrast.

[Series 2: DWI · axial · 3.0mm · 0.94mm/px · z∈[-74,+87]mm · 27 of 110 slices shown]
[im 1/110]
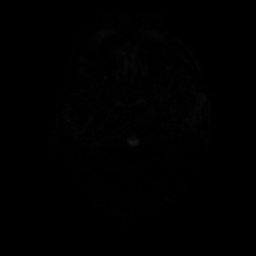
[im 5/110]
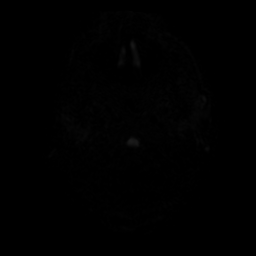
[im 9/110]
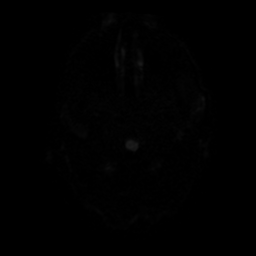
[im 13/110]
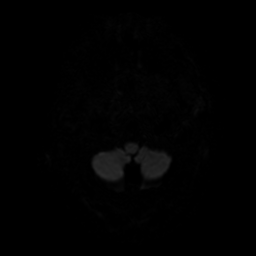
[im 17/110]
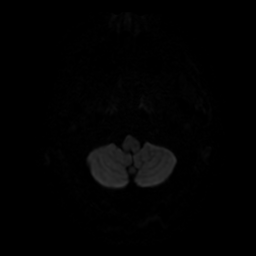
[im 21/110]
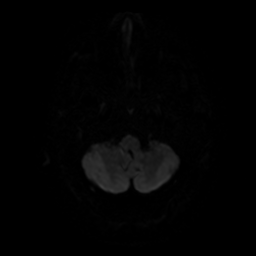
[im 26/110]
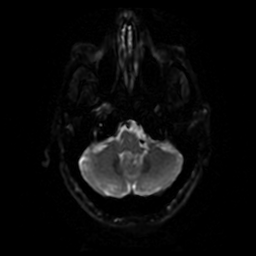
[im 30/110]
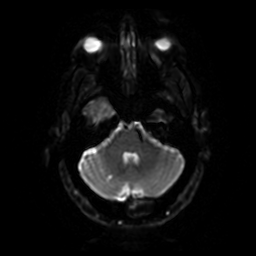
[im 34/110]
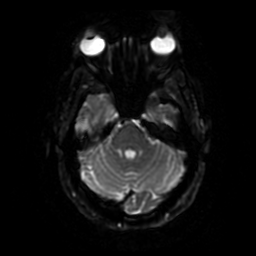
[im 38/110]
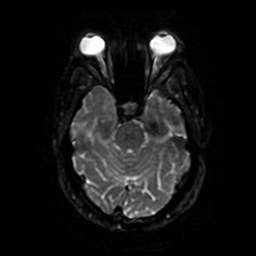
[im 42/110]
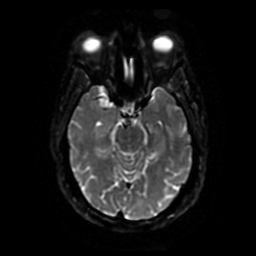
[im 47/110]
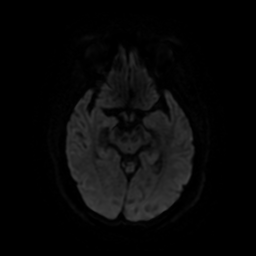
[im 51/110]
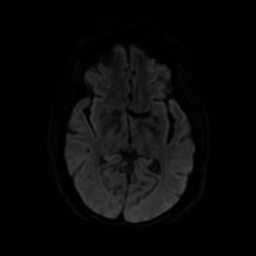
[im 55/110]
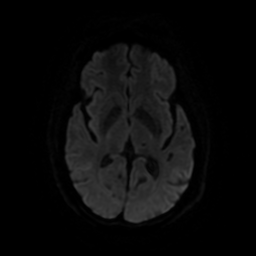
[im 59/110]
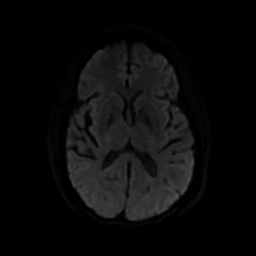
[im 63/110]
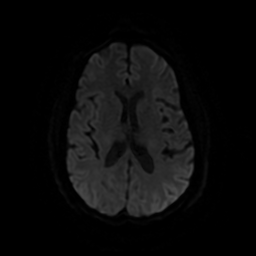
[im 68/110]
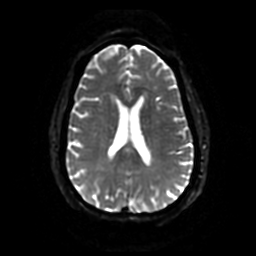
[im 72/110]
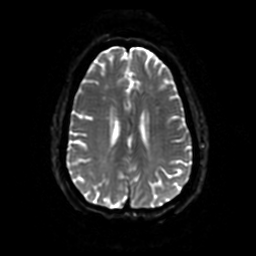
[im 76/110]
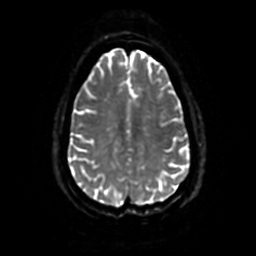
[im 80/110]
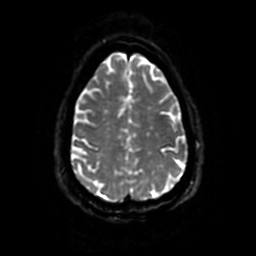
[im 84/110]
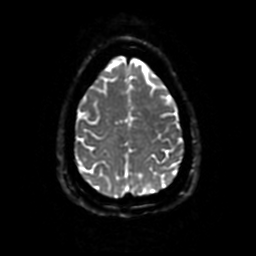
[im 89/110]
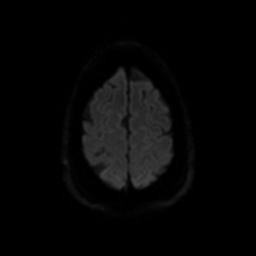
[im 93/110]
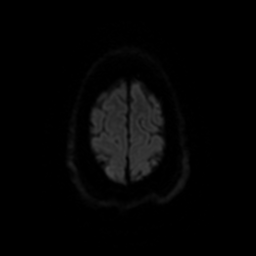
[im 97/110]
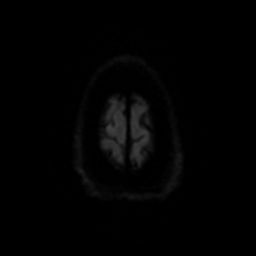
[im 101/110]
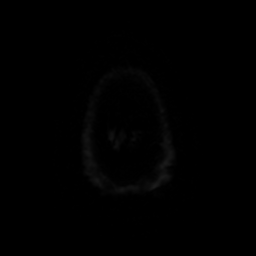
[im 105/110]
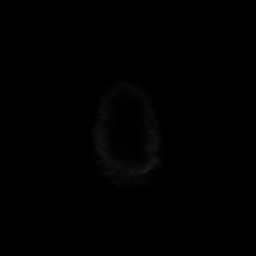
[im 110/110]
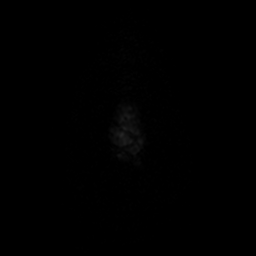

[Series 3: FLAIR · axial · 3.0mm · 0.45mm/px · z∈[-69,+86]mm · 7 of 27 slices shown]
[im 1/27]
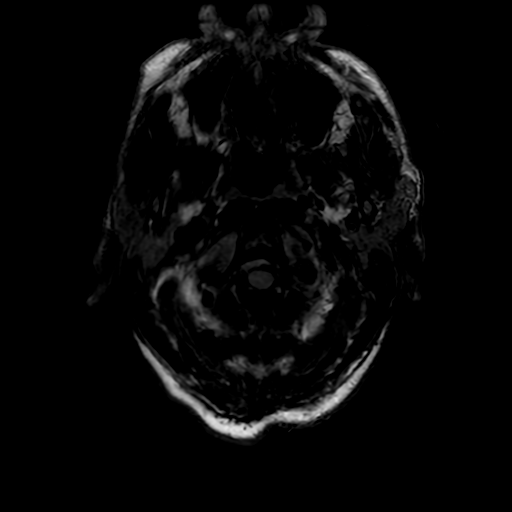
[im 5/27]
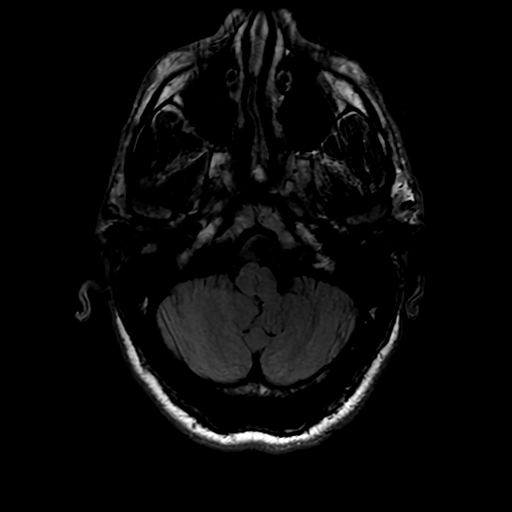
[im 9/27]
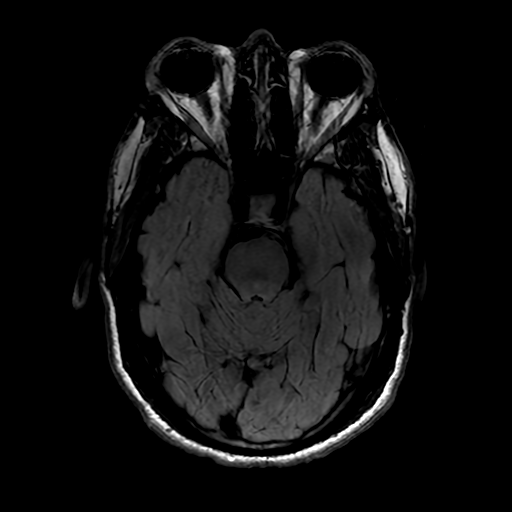
[im 14/27]
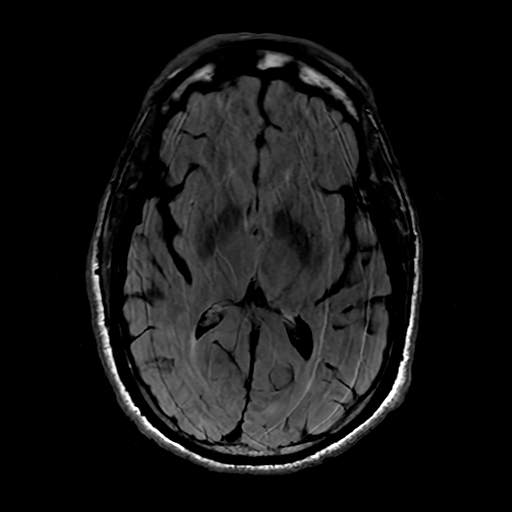
[im 18/27]
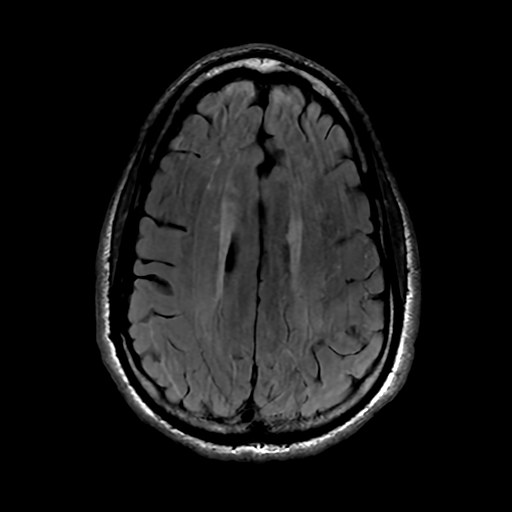
[im 22/27]
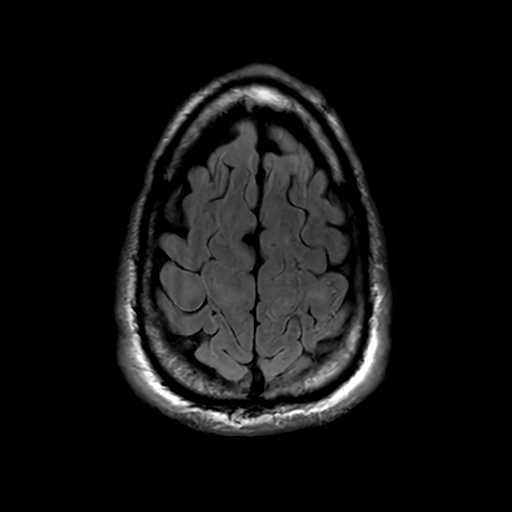
[im 27/27]
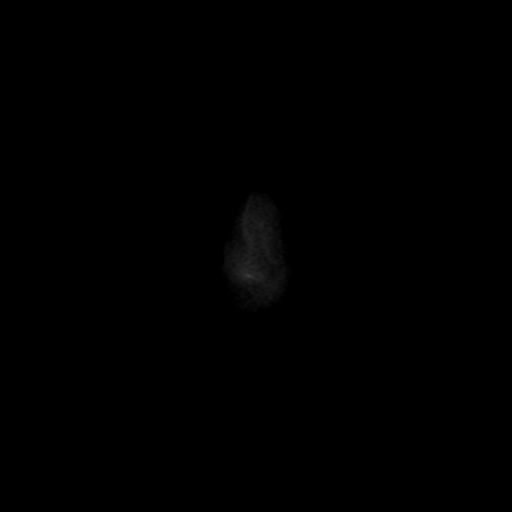

[Series 250: ADC · axial · 3.0mm · 0.94mm/px · z∈[-74,+87]mm · 14 of 55 slices shown]
[im 1/55]
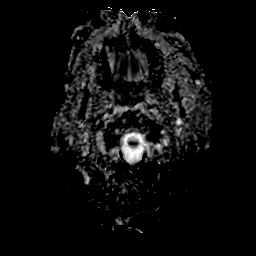
[im 5/55]
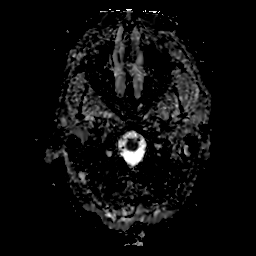
[im 9/55]
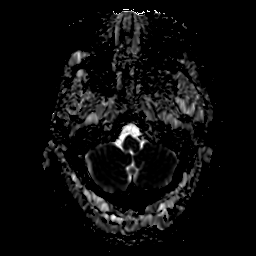
[im 13/55]
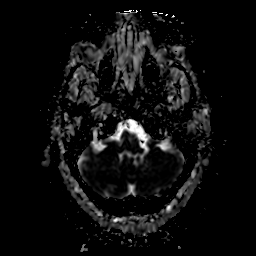
[im 17/55]
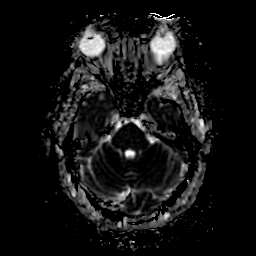
[im 21/55]
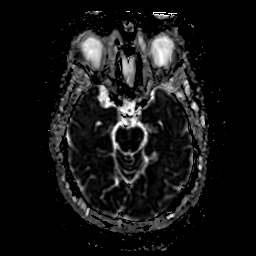
[im 25/55]
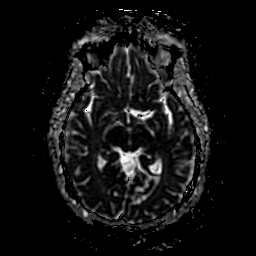
[im 30/55]
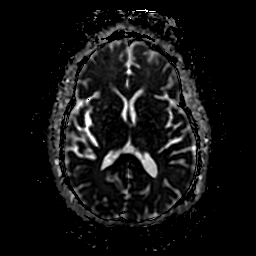
[im 34/55]
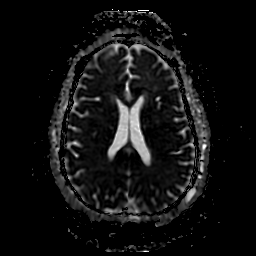
[im 38/55]
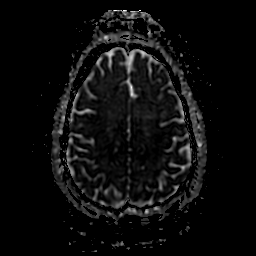
[im 42/55]
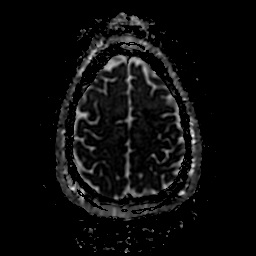
[im 46/55]
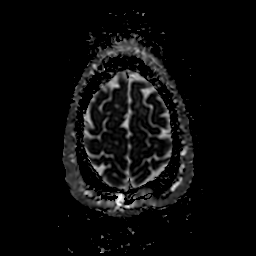
[im 50/55]
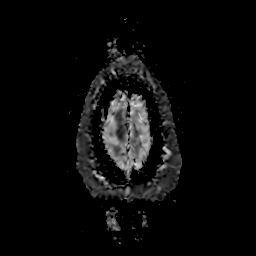
[im 55/55]
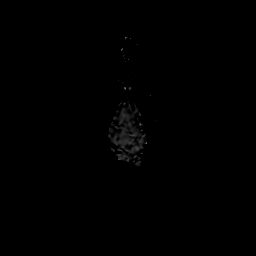

[48 of 48 positions shown; findings below may reference images not displayed]

FINDINGS: The examination had to be discontinued prior to completion due to
patient inability to continue. Axial DWI and FLAIR only obtained.

No restricted diffusion to suggest acute infarction.

No intracranial mass effect. No ventriculomegaly. FLAIR images
demonstrate patchy mild to moderate for age nonspecific bilateral
cerebral white matter hyperintensity. No cortical encephalomalacia
identified. FLAIR signal elsewhere within normal limits. However,
trace DWI images suggest chronic microhemorrhage in the dorsal left
thalamus (susceptibility on series 2, image 28), and perhaps also in
the right cerebellum.
IMPRESSION: 1. Truncated exam, negative for acute infarct.
2. Mild to moderate for age nonspecific white matter signal changes.
Several chronic microhemorrhages suspected.

## 2019-10-30 IMAGING — CT CT HEAD CODE STROKE
4 series · 17 of 47 positions shown, 19 images · non-contrast
Comparison: [DATE]

CLINICAL DATA: Code stroke.  Aphasia

EXAM:
CT HEAD WITHOUT CONTRAST
TECHNIQUE: Contiguous axial images were obtained from the base of the skull
through the vertex without intravenous contrast.

[Series 3: head wo · axial · 0.52mm/px · z∈[-134,+6]mm · 7 of 38 slices shown, 9 images]
[im 5/38  brain]
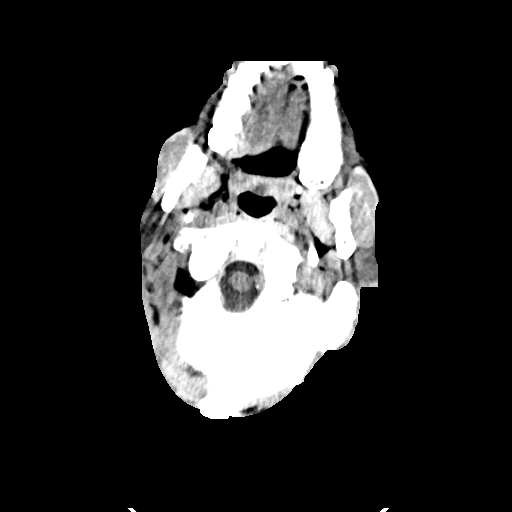
[im 5/38  bone]
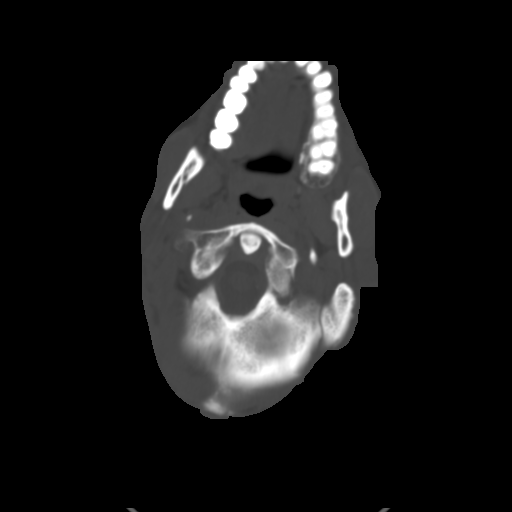
[im 10/38  brain]
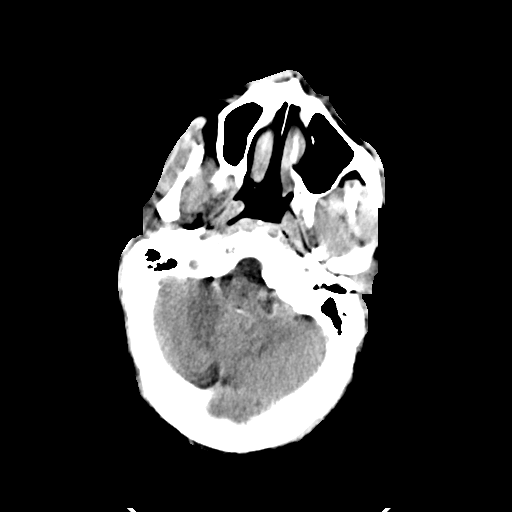
[im 14/38  brain]
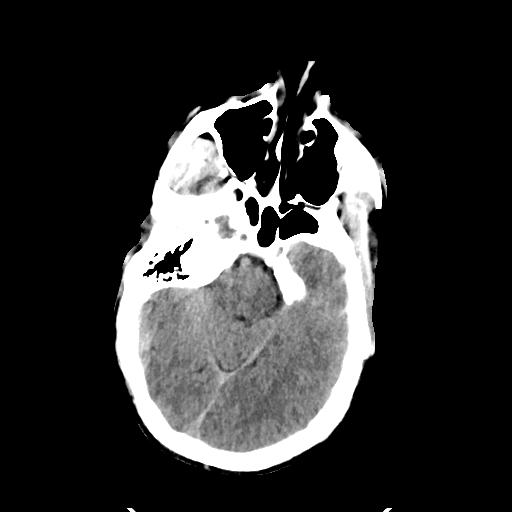
[im 19/38  brain]
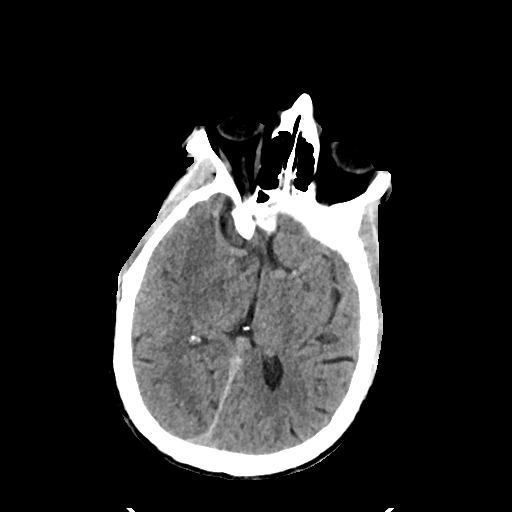
[im 24/38  brain]
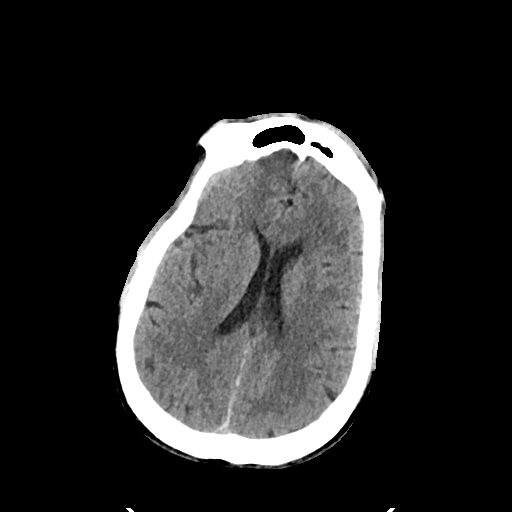
[im 24/38  bone]
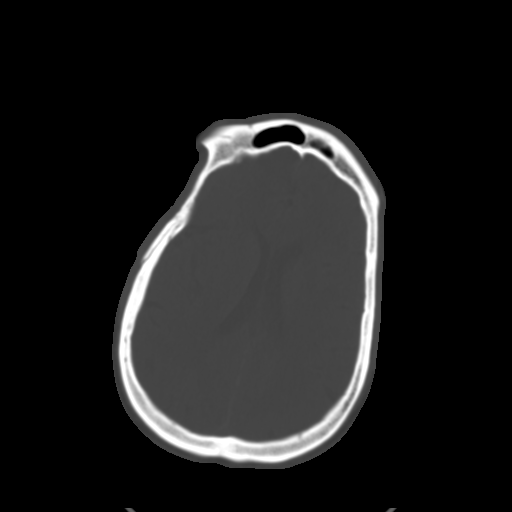
[im 28/38  brain]
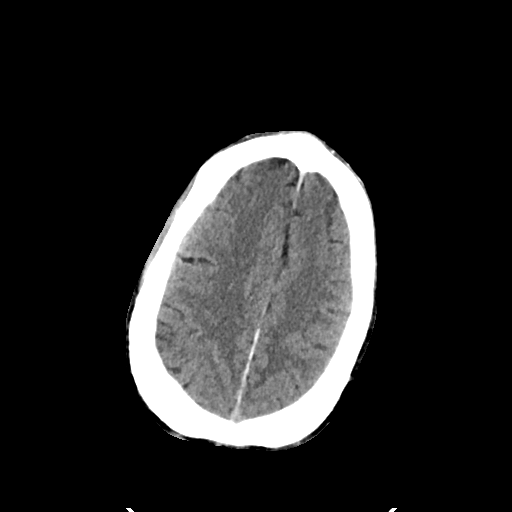
[im 33/38  brain]
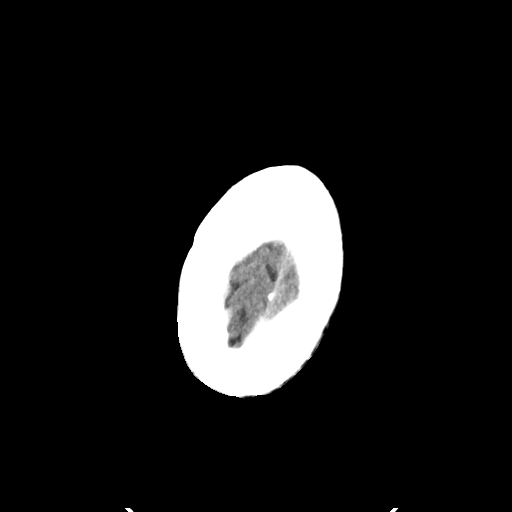

[Series 4: head bone · axial · 0.52mm/px · z∈[-136,-72]mm · 4 of 93 slices shown]
[im 10/93  bone]
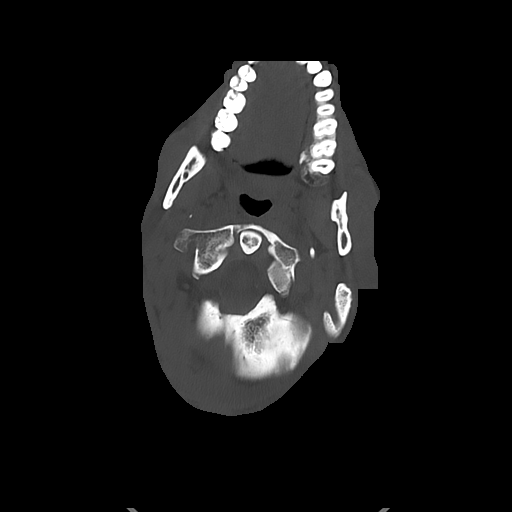
[im 19/93  bone]
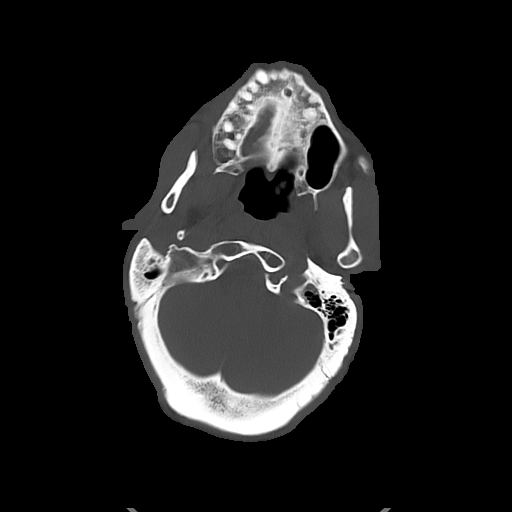
[im 28/93  bone]
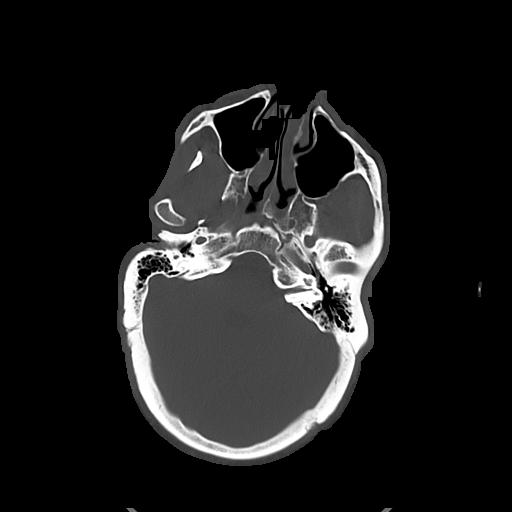
[im 42/93  bone]
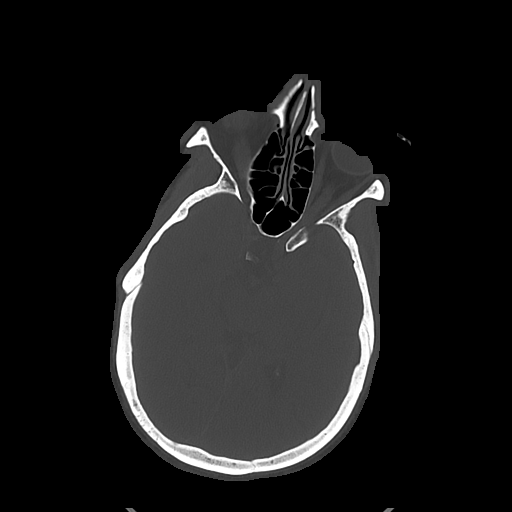

[Series 5: cor soft · coronal · 0.35mm/px · 3 of 81 slices shown]
[im 27/81  brain]
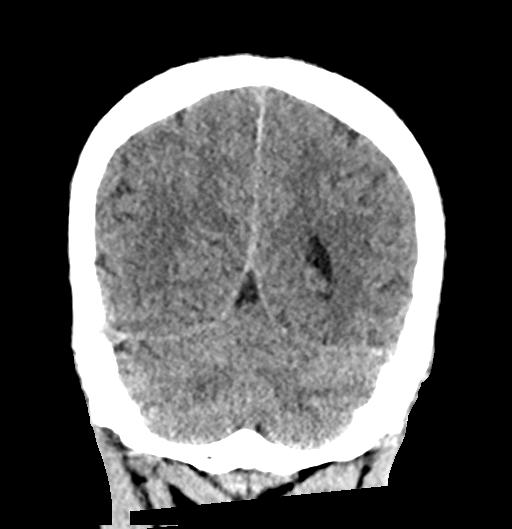
[im 36/81  brain]
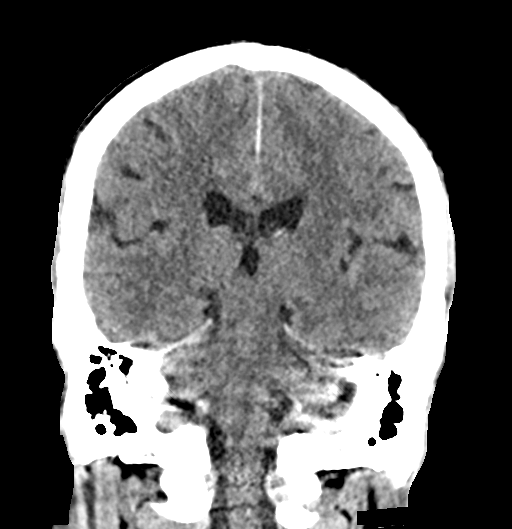
[im 45/81  brain]
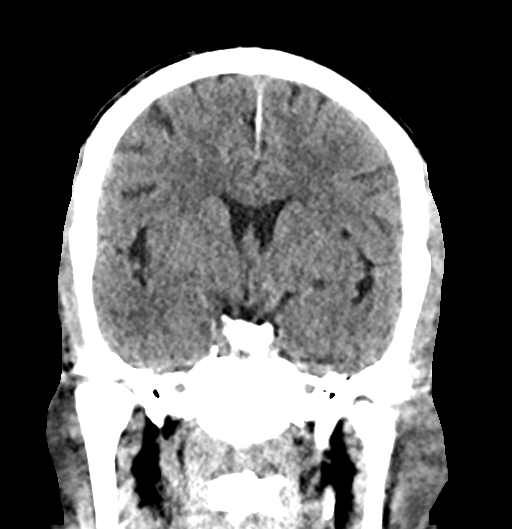

[Series 6: sag soft · sagittal · 0.37mm/px · 3 of 57 slices shown]
[im 20/57  brain]
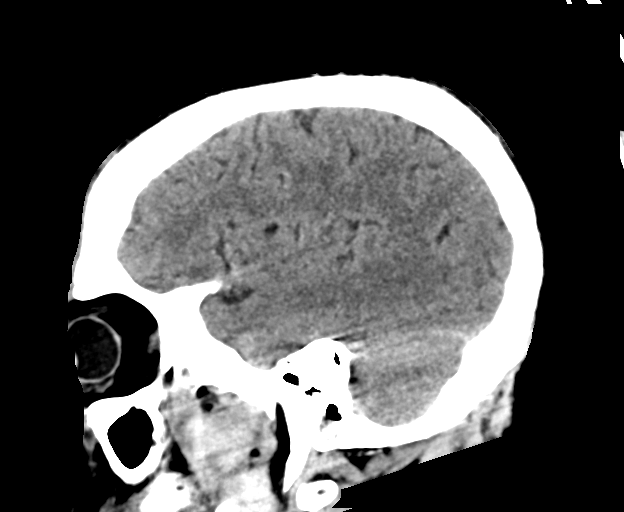
[im 29/57  brain]
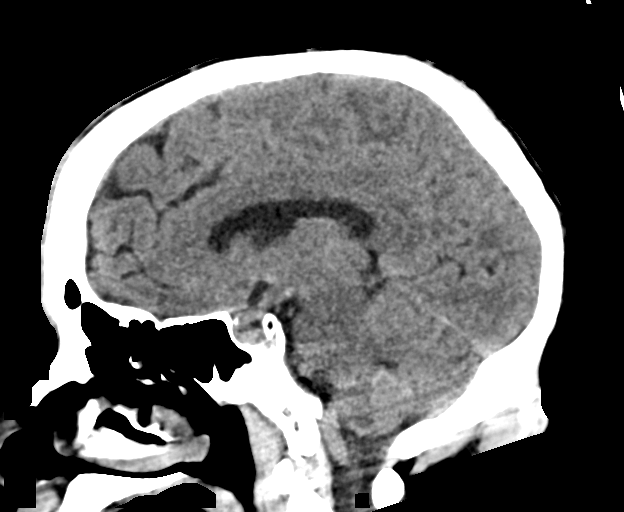
[im 37/57  brain]
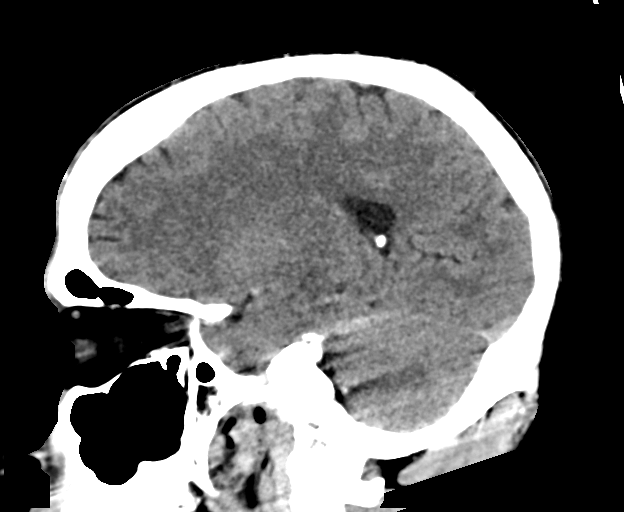

[17 of 47 positions shown; findings below may reference images not displayed]

FINDINGS: Brain: Normal appearance without evidence of atrophy, old or acute
infarction, mass lesion, hemorrhage, hydrocephalus or extra-axial
collection.

Vascular: There is atherosclerotic calcification of the major
vessels at the base of the brain.

Skull: Negative

Sinuses/Orbits: Clear/normal

Other: None

ASPECTS (Alberta Stroke Program Early CT Score)

- Ganglionic level infarction (caudate, lentiform nuclei, internal
capsule, insula, M1-M3 cortex): 7

- Supraganglionic infarction (M4-M6 cortex): 3

Total score (0-10 with 10 being normal): 10
IMPRESSION: 1. Normal head CT for age. Atherosclerotic calcification of the
major vessels at the base of the brain.
2. ASPECTS is 10.
3. These results were communicated to Dr. MARTA HENIEK at [DATE] pmon
[DATE]by text page via the [employer] messaging system.

## 2019-10-30 IMAGING — DX DG CHEST 1V
1 series · 1 of 1 positions shown · non-contrast
Comparison: [DATE]

CLINICAL DATA: Elevated blood pressure.

EXAM:
CHEST  1 VIEW

[chest]
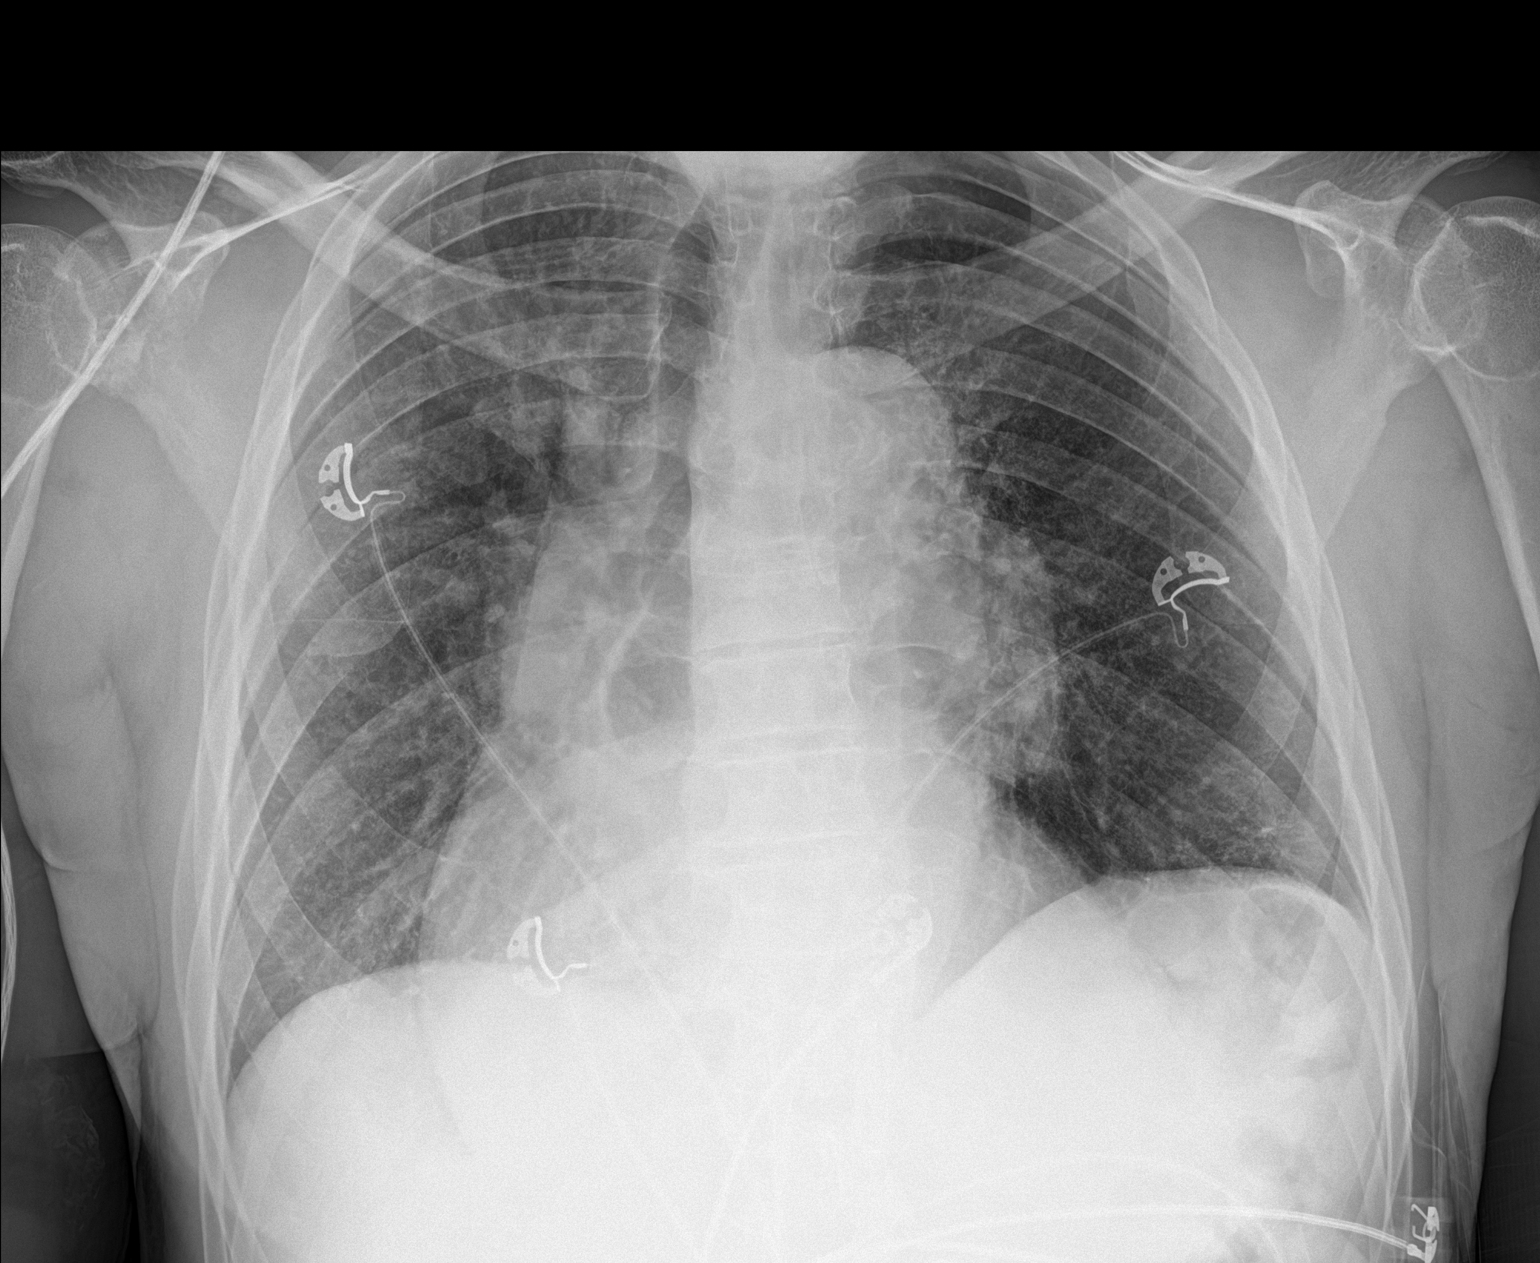

[1 of 1 positions shown; findings below may reference images not displayed]

FINDINGS: The study is limited secondary to patient rotation. Mild perihilar
pulmonary vascular prominence is noted. There is no evidence of
acute infiltrate, pleural effusion or pneumothorax. There is
moderate severity enlargement of the cardiac silhouette. The
visualized skeletal structures are unremarkable.
IMPRESSION: Cardiomegaly with mild pulmonary vascular congestion.

## 2019-10-30 MED ORDER — HYDRALAZINE HCL 20 MG/ML IJ SOLN
5.0000 mg | Freq: Four times a day (QID) | INTRAMUSCULAR | Status: DC
  Administered 2019-10-31 (×2): 5 mg via INTRAVENOUS
  Filled 2019-10-30 (×2): qty 1

## 2019-10-30 MED ORDER — ONDANSETRON HCL 4 MG/2ML IJ SOLN: 4.0000 mg | Freq: Four times a day (QID) | INTRAMUSCULAR | Status: DC | PRN

## 2019-10-30 MED ORDER — CLEVIDIPINE BUTYRATE 0.5 MG/ML IV EMUL
0.0000 mg/h | INTRAVENOUS | Status: DC
  Administered 2019-10-30: 1 mg/h via INTRAVENOUS
  Administered 2019-10-30: 16 mg/h via INTRAVENOUS
  Filled 2019-10-30 (×3): qty 50

## 2019-10-30 MED ORDER — ACETAMINOPHEN 325 MG PO TABS: 650.0000 mg | ORAL_TABLET | Freq: Four times a day (QID) | ORAL | Status: DC | PRN

## 2019-10-30 MED ORDER — IOHEXOL 350 MG/ML SOLN
100.0000 mL | Freq: Once | INTRAVENOUS | Status: AC | PRN
  Administered 2019-10-30: 100 mL via INTRAVENOUS

## 2019-10-30 MED ORDER — METOPROLOL TARTRATE 5 MG/5ML IV SOLN
5.0000 mg | Freq: Four times a day (QID) | INTRAVENOUS | Status: DC
  Administered 2019-10-31: 5 mg via INTRAVENOUS
  Filled 2019-10-30 (×2): qty 5

## 2019-10-30 MED ORDER — LABETALOL HCL 5 MG/ML IV SOLN: 10.0000 mg | INTRAVENOUS | Status: DC | PRN

## 2019-10-30 MED ORDER — METOPROLOL TARTRATE 5 MG/5ML IV SOLN
5.0000 mg | Freq: Once | INTRAVENOUS | Status: AC
  Administered 2019-10-30: 5 mg via INTRAVENOUS
  Filled 2019-10-30: qty 5

## 2019-10-30 MED ORDER — ACETAMINOPHEN 650 MG RE SUPP: 650.0000 mg | Freq: Four times a day (QID) | RECTAL | Status: DC | PRN

## 2019-10-30 MED ORDER — LORAZEPAM 2 MG/ML IJ SOLN: 2.0000 mg | INTRAMUSCULAR | Status: AC

## 2019-10-30 MED ORDER — ONDANSETRON HCL 4 MG PO TABS: 4.0000 mg | ORAL_TABLET | Freq: Four times a day (QID) | ORAL | Status: DC | PRN

## 2019-10-30 MED ORDER — LORAZEPAM 2 MG/ML IJ SOLN
INTRAMUSCULAR | Status: AC
  Administered 2019-10-30: 2 mg via INTRAVENOUS
  Filled 2019-10-30: qty 1

## 2019-10-30 MED ORDER — ONDANSETRON HCL 4 MG/2ML IJ SOLN
INTRAMUSCULAR | Status: AC
  Administered 2019-10-30: 4 mg
  Filled 2019-10-30: qty 2

## 2019-10-30 NOTE — ED Notes (Signed)
[place] daughter [phone] would like an update

## 2019-10-30 NOTE — ED Notes (Signed)
Pt sitting up, nodded yes when asked if he felt like he was going to vomit.

## 2019-10-30 NOTE — Consult Note (Addendum)
Neurology Consultation Reason for Consult: Aphasia Referring Physician: Viona Gilmore  CC: Aphasia  History is obtained from: EMS  HPI: Jason [name]. is a 59 y.o. male with a history of ESRD who presented to dialysis today around 12:30 PM.  At that time, he was able to speak normally and speaking in full sentences.  He then started treatment, and between the start of treatment in the end of treatment, he only said one word answers "uh huh, yes, etc." when dialysis was finishing, he was noted to be slightly diaphoretic, and a blood pressure showed 676 systolic (was 720 systolic on initiation of therapy).  It was at that time that was noted that he was confused.  Due to the confusion, EMS was called and he was activated as a code stroke en route  Initially I am suspecting that he was an acute ischemic infarct, but given that he was outside the IV TPA window, other treatment modalities were considered.  CTA/CTP did not reveal source of his symptoms, but there is still consideration of giving IV TPA under wake-up protocol if his MRI was amenable to this.  He was therefore taken for a stat MRI (I was accompanying him for all of this) which was negative for acute ischemic infarct.  LKW: 12:30 PM tpa given?: no, outside of window    ROS: Unable to obtain due to altered mental status.   Past Medical History:  Diagnosis Date  . A-V fistula (HCC)    Right arm  . Abdominal hernia   . AKI (acute kidney injury) ([place]) 01/2015  . Anasarca   . Anemia   . Asthma    as a child  . Bilateral inguinal hernia   . Bilateral pleural effusion 01/2017  . Cellulitis 01/16/2017   Bilateral lower extremity  . CHF (congestive heart failure) ([place])   . Chronic venous insufficiency   . Concentric left ventricular hypertrophy 08/17/2017   Moderated, noted on ECHO  . Diastolic dysfunction 94/70/9628   noted on ECHO  . Elevated LFTs    per patient, resolved  . End stage renal disease [facility])    Dialysis T/Th/Sa   Started 02/2017/pt is waiting for a kidney transplant  . H/O right inguinal hernia repair 11/2017  . History of colonoscopy 03/20/2018  . History of elevated PSA 10/2017  . Hypertension   . Pneumonia   . Pulmonary hypertension (HCC)    Moderate  . Venous stasis ulcer ([place])    bil legs/ healed up now per pt (03/06/18)     Family History  Problem Relation Age of Onset  . Heart attack Mother 57       CABG hx  . Heart disease Mother   . Hypertension Mother   . Stroke Mother   . Diabetes Father   . Healthy Brother   . Prostate cancer Maternal Grandfather   . Colon cancer Neg Hx   . Colon polyps Neg Hx   . Esophageal cancer Neg Hx   . Stomach cancer Neg Hx   . Rectal cancer Neg Hx      Social History:  reports that he quit smoking about 24 years ago. He quit after 17.00 years of use. He has never used smokeless tobacco. He reports previous alcohol use. He reports previous drug use.   Exam: Current vital signs: Wt 78.5 kg   BMI 22.22 kg/m  Vital signs in last 24 hours: Weight:  [78.5 kg] 78.5 kg (10/19 1700)   Physical Exam  Constitutional: Appears well-developed and well-nourished.  Psych:  Eyes: No scleral injection HENT: No OP obstrucion MSK: no joint deformities.  Cardiovascular: Normal rate and regular rhythm.  Respiratory: Effort normal, non-labored breathing GI: Soft.  No distension. There is no tenderness.  Skin: WDI  Neuro: Mental Status: Patient is awake, alert, he is able to tell me his name, able to name "pinky" when I show my little finger, but then perseverates on pinky. When I show my thumb, a pen, watch, all are "pinky" He answers yes/no but not clearly appropriately. Whn I ask to make a fist, he lifts his hand, but does not make a fist, does not close eyes to command either.  Cranial Nerves: II: blinks to threat bilaterally. . Pupils are equal, round, and reactive to light.   III,IV, VI: EOMI without ptosis or diploplia.  V: Facial sensation is  symmetric to temperature VII: Facial movement is symmetric.  VIII: hearing is intact to voice X: Uvula elevates symmetrically XI: Shoulder shrug is symmetric. XII: tongue is midline without atrophy or fasciculations.  Motor: Tone is normal. Bulk is normal. ? RUE drift, though with poor effort in all extremities, difficult to be certain. .  Sensory: Responds to stimuli bilaterally.  Cerebellar: No clear ataxia, but does not perform.    I have reviewed labs in epic and the results pertinent to this consultation are: Glucose 86 BUN 51  I have reviewed the images obtained:CT head - negative.   Impression: 59 year old male with acute confusional state/aphasia in the setting of severe hypertension.  Initially I was suspecting an ischemic stroke, but he was not a candidate for IV TPA due to being outside the window.  CTA/CTP/MRI were all negative, essentially ruling out ischemic stroke as an etiology.  At this point, I would favor ruling out seizure as another potential cause of focal neurological changes, but if this is negative then I think that hypertensive encephalopathy would be the most likely culprit.  Dialysis disequilibrium syndrome could also be a consideration.  Recommendations: 1) ammonia, TSH 2) stat EEG 3) no contraindication to management of blood pressure as hypertensive emergency per internal medicine/ER 4) neurology will continue to follow  This patient is critically ill and at significant risk of neurological worsening, death and care requires constant monitoring of vital signs, hemodynamics,respiratory and cardiac monitoring, neurological assessment, discussion with family, other specialists and medical decision making of high complexity. I spent 65 minutes of neurocritical care time  in the care of  this patient. This was time spent independent of any time provided by nurse practitioner or PA.  Roland Rack, MD [facility] [phone]  If 7pm-  7am, please page neurology on call as listed in [place]. 10/30/2019  7:51 PM

## 2019-10-30 NOTE — ED Provider Notes (Signed)
Patient cleared to go to CT for CODE STROKE. Protecting Airway.    Margarita Mail, PA-C 10/30/19 1715    Drenda Freeze, MD 10/30/19 2119

## 2019-10-30 NOTE — Progress Notes (Signed)
Stat  EEG complete - results pending.  

## 2019-10-30 NOTE — Procedures (Signed)
History: 59 year old male presenting with aphasia  Sedation: 2 mg Ativan given few hours prior to EEG  Technique: This is a 21 channel routine scalp EEG performed at the bedside with bipolar and monopolar montages arranged in accordance to the international 10/20 system of electrode placement. One channel was dedicated to EKG recording.    Background: There is a posterior dominant rhythm of 7 to 8 Hz which is seen bilaterally.  In addition, there is admixed generalized irregular delta and theta range activities.  Clear sleep structures are not recorded.  Photic stimulation: Physiologic driving is now performed  EEG Abnormalities: 1) generalized irregular slow activity 2) slow posterior dominant rhythm  Clinical Interpretation: This EEG is consistent with a generalized nonspecific cerebral dysfunction (encephalopathy). There was no seizure or seizure predisposition recorded on this study. Please note that lack of epileptiform activity on EEG does not preclude the possibility of epilepsy.   Roland Rack, MD [facility] [phone]  If 7pm- 7am, please page neurology on call as listed in [place].

## 2019-10-30 NOTE — ED Provider Notes (Signed)
[facility] Emergency Department Provider Note MRN:  [id number]  Arrival date & time: 10/30/19     Chief Complaint   Code Stroke   History of Present Illness   Jason [name]. is a 59 y.o. year-old male with a history of ESRD presenting to the ED with chief complaint of code stroke.  Last known normal 12:30 PM.  Since then demonstrating dense aphasia, occasionally says "yeah" but otherwise unable to make words.  No other obvious deficits.  I was unable to obtain an accurate HPI, PMH, or ROS due to the patient's aphasia.  Level 5 caveat. Review of Systems  Positive for aphasia.  Patient's Health History    Past Medical History:  Diagnosis Date  . A-V fistula (HCC)    Right arm  . Abdominal hernia   . AKI (acute kidney injury) ([place]) 01/2015  . Anasarca   . Anemia   . Asthma    as a child  . Bilateral inguinal hernia   . Bilateral pleural effusion 01/2017  . Cellulitis 01/16/2017   Bilateral lower extremity  . CHF (congestive heart failure) ([place])   . Chronic venous insufficiency   . Concentric left ventricular hypertrophy 08/17/2017   Moderated, noted on ECHO  . Diastolic dysfunction 32/67/1245   noted on ECHO  . Elevated LFTs    per patient, resolved  . End stage renal disease [facility])    Dialysis T/Th/Sa  Started 02/2017/pt is waiting for a kidney transplant  . H/O right inguinal hernia repair 11/2017  . History of colonoscopy 03/20/2018  . History of elevated PSA 10/2017  . Hypertension   . Pneumonia   . Pulmonary hypertension (HCC)    Moderate  . Venous stasis ulcer ([place])    bil legs/ healed up now per pt (03/06/18)    Past Surgical History:  Procedure Laterality Date  . AV FISTULA PLACEMENT Right 01/20/2017   Procedure: ARTERIOVENOUS (AV) FISTULA CREATION;  Surgeon: Angelia Mould, MD;  Location: [place];  Service: Vascular;  Laterality: Right;  . HERNIA REPAIR    . INSERTION OF DIALYSIS CATHETER Right 01/20/2017   Procedure:  INSERTION OF DIALYSIS CATHETER;  Surgeon: Angelia Mould, MD;  Location: [place];  Service: Vascular;  Laterality: Right;  . INSERTION OF MESH N/A 11/18/2017   Procedure: INSERTION OF MESH;  Surgeon: Ferlando Lia Boston, MD;  Location: [facility];  Service: General;  Laterality: N/A;  . IR FLUORO GUIDE CV LINE RIGHT  01/18/2017  . IR US GUIDE VASC ACCESS RIGHT  01/18/2017  . LAPAROSCOPIC INGUINAL HERNIA WITH UMBILICAL HERNIA Bilateral 11/18/2017   Procedure: LAPAROSCOPIC BILATERAL INGUINAL HERNIA REPAIR WITH UMBILICAL HERNIA REPAIR WITH MESH;  Surgeon: Braxtyn Bojarski Boston, MD;  Location: [facility];  Service: General;  Laterality: Bilateral;  . TOE SURGERY     right fracture in big toe  . TONSILLECTOMY      Family History  Problem Relation Age of Onset  . Heart attack Mother 84       CABG hx  . Heart disease Mother   . Hypertension Mother   . Stroke Mother   . Diabetes Father   . Healthy Brother   . Prostate cancer Maternal Grandfather   . Colon cancer Neg Hx   . Colon polyps Neg Hx   . Esophageal cancer Neg Hx   . Stomach cancer Neg Hx   . Rectal cancer Neg Hx     Social History   Socioeconomic History  . Marital status:  Single    Spouse name: Not on file  . Number of children: Not on file  . Years of education: Not on file  . Highest education level: Not on file  Occupational History  . Not on file  Tobacco Use  . Smoking status: Former Smoker    Years: 17.00    Quit date: 1997    Years since quitting: 24.8  . Smokeless tobacco: Never Used  . Tobacco comment: quit smoking in 1997  Vaping Use  . Vaping Use: Never used  Substance and Sexual Activity  . Alcohol use: Not Currently    Comment: every several months, rare  . Drug use: Not Currently    Comment: marijuana  . Sexual activity: Not Currently  Other Topics Concern  . Not on file  Social History Narrative  . Not on file   Social Determinants of Health   Financial Resource Strain:   . Difficulty of Paying Living Expenses:  Not on file  Food Insecurity:   . Worried About Charity fundraiser in the Last Year: Not on file  . Ran Out of Food in the Last Year: Not on file  Transportation Needs:   . Lack of Transportation (Medical): Not on file  . Lack of Transportation (Non-Medical): Not on file  Physical Activity:   . Days of Exercise per Week: Not on file  . Minutes of Exercise per Session: Not on file  Stress:   . Feeling of Stress : Not on file  Social Connections:   . Frequency of Communication with Friends and Family: Not on file  . Frequency of Social Gatherings with Friends and Family: Not on file  . Attends Religious Services: Not on file  . Active Member of Clubs or Organizations: Not on file  . Attends Archivist Meetings: Not on file  . Marital Status: Not on file  Intimate Partner Violence:   . Fear of Current or Ex-Partner: Not on file  . Emotionally Abused: Not on file  . Physically Abused: Not on file  . Sexually Abused: Not on file     Physical Exam   Vitals:   10/30/19 2315 10/30/19 2330  BP:    Pulse: 76 79  Resp: 20 20  SpO2: 98% 94%    CONSTITUTIONAL: Well-appearing, NAD NEURO: Awake, alert, dense expressive aphasia, moves all extremities equally EYES:  eyes equal and reactive ENT/NECK:  no LAD, no JVD CARDIO: Regular rate, well-perfused, normal S1 and S2 PULM:  CTAB no wheezing or rhonchi GI/GU:  normal bowel sounds, non-distended, non-tender MSK/SPINE:  No gross deformities, no edema SKIN:  no rash, atraumatic PSYCH:  Appropriate speech and behavior  *Additional and/or pertinent findings included in MDM below  Diagnostic and Interventional Summary    EKG Interpretation  Date/Time:  Tuesday October 30 2019 19:14:16 EDT Ventricular Rate:  77 PR Interval:    QRS Duration: 110 QT Interval:  447 QTC Calculation: 506 R Axis:   -47 Text Interpretation: Sinus rhythm Consider right atrial enlargement LAD, consider left anterior fascicular block Left  ventricular hypertrophy Borderline T abnormalities, lateral leads Prolonged QT interval Confirmed by Gerlene Fee [phone]) on 10/30/2019 11:35:22 PM      Labs Reviewed  CBC - Abnormal; Notable for the following components:      Result Value   WBC 3.8 (*)    RBC 3.48 (*)    Hemoglobin 10.4 (*)    HCT 31.3 (*)    All other components within normal limits  DIFFERENTIAL - Abnormal; Notable for the following components:   Lymphs Abs 0.4 (*)    All other components within normal limits  COMPREHENSIVE METABOLIC PANEL - Abnormal; Notable for the following components:   Chloride 95 (*)    BUN 50 (*)    Creatinine, Ser 8.65 (*)    Calcium 10.6 (*)    Alkaline Phosphatase 32 (*)    Total Bilirubin 1.5 (*)    GFR, Estimated 6 (*)    Anion gap 17 (*)    All other components within normal limits  AMMONIA - Abnormal; Notable for the following components:   Ammonia 37 (*)    All other components within normal limits  I-STAT CHEM 8, ED - Abnormal; Notable for the following components:   Chloride 97 (*)    BUN 51 (*)    Creatinine, Ser 9.30 (*)    Hemoglobin 10.5 (*)    HCT 31.0 (*)    All other components within normal limits  TROPONIN I (HIGH SENSITIVITY) - Abnormal; Notable for the following components:   Troponin I (High Sensitivity) 145 (*)    All other components within normal limits  TROPONIN I (HIGH SENSITIVITY) - Abnormal; Notable for the following components:   Troponin I (High Sensitivity) 144 (*)    All other components within normal limits  RESP PANEL BY RT PCR (RSV, FLU A&B, COVID)  ETHANOL  PROTIME-INR  APTT  TSH  RAPID URINE DRUG SCREEN, HOSP PERFORMED  URINALYSIS, ROUTINE W REFLEX MICROSCOPIC  VITAMIN B12  FOLATE  BLOOD GAS, VENOUS  COMPREHENSIVE METABOLIC PANEL  CBC WITH DIFFERENTIAL/PLATELET    DG Chest 1 View  Final Result    MR BRAIN WO CONTRAST  Final Result    CT Code Stroke CTA Head W/WO contrast  Final Result  Addendum 1 of 1  ADDENDUM REPORT:  10/30/2019 21:59    ADDENDUM:  In addition to the initially described findings, note is made of a  2.5 x 1.6 cm irregular nodule at the medial left upper lobe. This is  described in the initial finding section of the report, but omitted  from the impression by mistake. Further evaluation with dedicated  cross-sectional imaging of the chest recommended for complete  characterization. Underlying emphysema noted as well. Emphysema  (ICD10-J43.9).    Addended findings were communicated by telephone at the time of  interpretation on 10/30/2019 at 9:56 pm to Dr. Curly Shores, who verbally  acknowledged these results.      Electronically Signed    By: Jeannine Boga M.D.    On: 10/30/2019 21:59      Final    CT Code Stroke CTA Neck W/WO contrast  Final Result  Addendum 1 of 1  ADDENDUM REPORT: 10/30/2019 21:59    ADDENDUM:  In addition to the initially described findings, note is made of a  2.5 x 1.6 cm irregular nodule at the medial left upper lobe. This is  described in the initial finding section of the report, but omitted  from the impression by mistake. Further evaluation with dedicated  cross-sectional imaging of the chest recommended for complete  characterization. Underlying emphysema noted as well. Emphysema  (ICD10-J43.9).    Addended findings were communicated by telephone at the time of  interpretation on 10/30/2019 at 9:56 pm to Dr. Curly Shores, who verbally  acknowledged these results.      Electronically Signed    By: Jeannine Boga M.D.    On: 10/30/2019 21:59      Final  CT CEREBRAL PERFUSION W CONTRAST  Final Result  Addendum 1 of 1  ADDENDUM REPORT: 10/30/2019 21:59    ADDENDUM:  In addition to the initially described findings, note is made of a  2.5 x 1.6 cm irregular nodule at the medial left upper lobe. This is  described in the initial finding section of the report, but omitted  from the impression by mistake. Further evaluation with  dedicated  cross-sectional imaging of the chest recommended for complete  characterization. Underlying emphysema noted as well. Emphysema  (ICD10-J43.9).    Addended findings were communicated by telephone at the time of  interpretation on 10/30/2019 at 9:56 pm to Dr. Curly Shores, who verbally  acknowledged these results.      Electronically Signed    By: Jeannine Boga M.D.    On: 10/30/2019 21:59      Final    CT HEAD CODE STROKE WO CONTRAST  Final Result      Medications  acetaminophen (TYLENOL) tablet 650 mg (has no administration in time range)    Or  acetaminophen (TYLENOL) suppository 650 mg (has no administration in time range)  ondansetron (ZOFRAN) tablet 4 mg (has no administration in time range)    Or  ondansetron (ZOFRAN) injection 4 mg (has no administration in time range)  hydrALAZINE (APRESOLINE) injection 5 mg (has no administration in time range)  metoprolol tartrate (LOPRESSOR) injection 5 mg (has no administration in time range)  labetalol (NORMODYNE) injection 10 mg (has no administration in time range)  ondansetron (ZOFRAN) 4 MG/2ML injection (4 mg  Given 10/30/19 1732)  LORazepam (ATIVAN) injection 2 mg (2 mg Intravenous Given 10/30/19 1740)  iohexol (OMNIPAQUE) 350 MG/ML injection 100 mL (100 mLs Intravenous Contrast Given 10/30/19 1827)     Procedures  /  Critical Care .Critical Care Performed by: Maudie Flakes, MD Authorized by: Maudie Flakes, MD   Critical care provider statement:    Critical care time (minutes):  31   Critical care was necessary to treat or prevent imminent or life-threatening deterioration of the following conditions: Concern for acute ischemic stroke.   Critical care was time spent personally by me on the following activities:  Discussions with consultants, evaluation of patient's response to treatment, examination of patient, ordering and performing treatments and interventions, ordering and review of laboratory studies,  ordering and review of radiographic studies, pulse oximetry, re-evaluation of patient's condition, obtaining history from patient or surrogate and review of old charts    ED Course and Medical Decision Making  I have reviewed the triage vital signs, the nursing notes, and pertinent available records from the EMR.  Listed above are laboratory and imaging tests that I personally ordered, reviewed, and interpreted and then considered in my medical decision making (see below for details).  Aphasia, last known normal 12:30 PM, outside of the window for TPA, awaiting CTA and CT perfusion.     Brain imaging is overall reassuring without evidence of acute stroke.  Neurology now favoring hypertensive urgency or crisis.  Starting Cleviprex, will admit to medicine service.  Barth Kirks. Sedonia Small, [place] [email or web address]  Final Clinical Impressions(s) / ED Diagnoses     ICD-10-CM   1. Hypertensive crisis  I16.9   2. Hypoxia  R09.02 DG Chest 1 View    DG Chest 1 View  3. Aphasia  R47.01     ED Discharge Orders    None       Discharge Instructions Discussed  with and Provided to Patient:   Discharge Instructions   None       Maudie Flakes, MD 10/30/19 2336

## 2019-10-30 NOTE — ED Notes (Signed)
Pt to MRI VIA stretcher by RN

## 2019-10-30 NOTE — ED Triage Notes (Signed)
Pt code stroke via EMS. ED MD and neurologist at bridge upon arrival. Pt went to dialysis today at 1230. Pt had 3hrs, 51min of dialysis (norm is 4hr 29min), pt started sweating, RN at dialysis checked BP, was elevated 210/130, pt's norm is 190s at dialysis. Pt was not able to answer questions appropriately at that time and EMS was called. Pt arrived alert, unable to answer questions appropriately, follows some commands after multiple prompts. Pt to CT scanner.

## 2019-10-30 NOTE — H&P (Addendum)
History and Physical    Jason [name]. BTD:[id number] DOB: March 22, 1960 DOA: 10/30/2019  PCP: [name], FNP  Patient coming from: Hemodialysis center   Chief Complaint:  Chief Complaint  Patient presents with  . Code Stroke     HPI:    59 year old male with past medical history of end-stage renal disease, hypertension, hypertensive cardiomyopathy with chronic systolic and diastolic just of heart failure (Echo 2/ 2021 with EF 50-55%) known history of mediastinal mass (followed at [facility] by Pulmonology) who presents to [facility] emergency department after presenting via EMS after sudden onset of lethargy and inability to speak while at dialysis earlier in the day on 10/19.  Patient is unable to provide a history due to lethargy, confusion and expressive aphasia.  According to emergency department staff and reports by EMS, patient underwent 3 hours 37 minutes of hemodialysis earlier today.  Throughout the dialysis session, blood pressures were higher than usual reportedly 210/130 by EMS.  Patient is typically hypertensive during his hemodialysis sessions although usual blood pressures are in the upper 100s.    By the end of the patient's hemodialysis session the patient was exhibiting lethargy and inability to verbalize answers to questions.  EMS was then called and patient was brought into [facility] emergency department as a code stroke.  Upon arrival, patient underwent prompt evaluation by Dr. Leonel Ramsay with neurology.  Initially there was concern for an acute stroke although patient quickly underwent head imaging including noncontrast CT head, CT angiogram of the head and neck as well as MRI brain which revealed no evidence of acute stroke.  The only abnormality found on MRI was evidence of several chronic microhemorrhages.  Neurology at that point felt that the most likely culprit for the patient's presentation was hypertensive encephalopathy.  They  recommended admitting to medicine, completing encephalopathy work-up and titration of antihypertensive therapy.  The hospitalist group was then called to assess the patient for admission to the hospital.  Review of Systems:   Review of Systems  Unable to perform ROS: Patient nonverbal    Past Medical History:  Diagnosis Date  . A-V fistula (HCC)    Right arm  . Abdominal hernia   . AKI (acute kidney injury) ([place]) 01/2015  . Anasarca   . Anemia   . Asthma    as a child  . Bilateral inguinal hernia   . Bilateral pleural effusion 01/2017  . Cellulitis 01/16/2017   Bilateral lower extremity  . CHF (congestive heart failure) ([place])   . Chronic venous insufficiency   . Concentric left ventricular hypertrophy 08/17/2017   Moderated, noted on ECHO  . Diastolic dysfunction 36/46/8032   noted on ECHO  . Elevated LFTs    per patient, resolved  . End stage renal disease [facility])    Dialysis T/Th/Sa  Started 02/2017/pt is waiting for a kidney transplant  . H/O right inguinal hernia repair 11/2017  . History of colonoscopy 03/20/2018  . History of elevated PSA 10/2017  . Hypertension   . Pneumonia   . Pulmonary hypertension (HCC)    Moderate  . Venous stasis ulcer ([place])    bil legs/ healed up now per pt (03/06/18)    Past Surgical History:  Procedure Laterality Date  . AV FISTULA PLACEMENT Right 01/20/2017   Procedure: ARTERIOVENOUS (AV) FISTULA CREATION;  Surgeon: Angelia Mould, MD;  Location: [place];  Service: Vascular;  Laterality: Right;  . HERNIA REPAIR    . INSERTION OF  DIALYSIS CATHETER Right 01/20/2017   Procedure: INSERTION OF DIALYSIS CATHETER;  Surgeon: Angelia Mould, MD;  Location: [place];  Service: Vascular;  Laterality: Right;  . INSERTION OF MESH N/A 11/18/2017   Procedure: INSERTION OF MESH;  Surgeon: Michael Boston, MD;  Location: [facility];  Service: General;  Laterality: N/A;  . IR FLUORO GUIDE CV LINE RIGHT  01/18/2017  . IR US GUIDE VASC ACCESS RIGHT   01/18/2017  . LAPAROSCOPIC INGUINAL HERNIA WITH UMBILICAL HERNIA Bilateral 11/18/2017   Procedure: LAPAROSCOPIC BILATERAL INGUINAL HERNIA REPAIR WITH UMBILICAL HERNIA REPAIR WITH MESH;  Surgeon: Michael Boston, MD;  Location: [facility];  Service: General;  Laterality: Bilateral;  . TOE SURGERY     right fracture in big toe  . TONSILLECTOMY       reports that he quit smoking about 24 years ago. He quit after 17.00 years of use. He has never used smokeless tobacco. He reports previous alcohol use. He reports previous drug use.  Allergies  Allergen Reactions  . Lisinopril Cough    Family History  Problem Relation Age of Onset  . Heart attack Mother 93       CABG hx  . Heart disease Mother   . Hypertension Mother   . Stroke Mother   . Diabetes Father   . Healthy Brother   . Prostate cancer Maternal Grandfather   . Colon cancer Neg Hx   . Colon polyps Neg Hx   . Esophageal cancer Neg Hx   . Stomach cancer Neg Hx   . Rectal cancer Neg Hx      Prior to Admission medications   Medication Sig Start Date End Date Taking? Authorizing Provider  amLODipine (NORVASC) 10 MG tablet Take 10 mg by mouth every evening. 06/20/18   [provider]  carvedilol (COREG) 25 MG tablet Take 1 tablet (25 mg total) by mouth 2 (two) times daily with a meal. 02/02/18   [name], FNP  hydrALAZINE (APRESOLINE) 25 MG tablet Take 1 tablet (25 mg total) by mouth every 8 (eight) hours. 02/19/19   Domenic Polite, MD  multivitamin (RENA-VIT) TABS tablet Take 1 tablet by mouth daily. 10/20/17   [provider]  sevelamer carbonate (RENVELA) 2.4 g PACK Take 2.4 g by mouth 3 (three) times daily with meals.    [provider]    Physical Exam: Vitals:   10/30/19 2245 10/30/19 2300 10/30/19 2315 10/30/19 2330  BP: (!) 179/107     Pulse: 86 84 76 79  Resp: 20 18 20 20   SpO2: (!) 63% 96% 98% 94%  Weight:      Height:        Constitutional: Patient is lethargic and exhibits limited  ability to verbalize answers to questions.  Patient is seemingly oriented x2 (person, place),  patient is currently not in any acute distress. Skin: no rashes, no lesions, good skin turgor noted. Eyes: Pupils are equally reactive to light.  No evidence of scleral icterus or conjunctival pallor.  ENMT: Moist mucous membranes noted.  Posterior pharynx clear of any exudate or lesions.   Neck: normal, supple, no masses, no thyromegaly.  No evidence of jugular venous distension.   Respiratory: clear to auscultation bilaterally, no wheezing, no crackles. Normal respiratory effort. No accessory muscle use.  Cardiovascular: Regular rate and rhythm, no murmurs / rubs / gallops. No extremity edema. 2+ pedal pulses. No carotid bruits.  Chest:   Nontender without crepitus or deformity.   Back:   Nontender  without crepitus or deformity. Abdomen: Abdomen is soft and nontender.  No evidence of intra-abdominal masses.  Positive bowel sounds noted in all quadrants.   Musculoskeletal: No joint deformity upper and lower extremities. Good ROM, no contractures. Normal muscle tone.  Neurologic: Patient is following all commands.  Patient is lethargic but arousable.  Patient is unable to state the year but is able to state the year and location correctly.  CN 2-12 grossly intact. Sensation intact.  Patient moving all 4 extremities spontaneously.   Patient is responsive to verbal stimuli.   Psychiatric: Unable to fully assess due to significant lethargy.  Flat affect at this time.  Patient currently does not seem to possess insight as to their current situation.     Labs on Admission: I have personally reviewed following labs and imaging studies -   CBC: Recent Labs  Lab 10/30/19 1714 10/30/19 1719  WBC 3.8*  --   NEUTROABS 2.8  --   HGB 10.4* 10.5*  HCT 31.3* 31.0*  MCV 89.9  --   PLT 186  --    Basic Metabolic Panel: Recent Labs  Lab 10/30/19 1714 10/30/19 1719  NA 138 136  K 3.9 3.8  CL 95* 97*  CO2  26  --   GLUCOSE 88 86  BUN 50* 51*  CREATININE 8.65* 9.30*  CALCIUM 10.6*  --    GFR: Estimated Creatinine Clearance: 9.9 mL/min (A) (by C-G formula based on SCr of 9.3 mg/dL (H)). Liver Function Tests: Recent Labs  Lab 10/30/19 1714  AST 16  ALT 17  ALKPHOS 32*  BILITOT 1.5*  PROT 7.5  ALBUMIN 4.2   No results for input(s): LIPASE, AMYLASE in the last 168 hours. Recent Labs  Lab 10/30/19 2030  AMMONIA 37*   Coagulation Profile: Recent Labs  Lab 10/30/19 1714  INR 1.2   Cardiac Enzymes: No results for input(s): CKTOTAL, CKMB, CKMBINDEX, TROPONINI in the last 168 hours. BNP (last 3 results) No results for input(s): PROBNP in the last 8760 hours. HbA1C: No results for input(s): HGBA1C in the last 72 hours. CBG: No results for input(s): GLUCAP in the last 168 hours. Lipid Profile: No results for input(s): CHOL, HDL, LDLCALC, TRIG, CHOLHDL, LDLDIRECT in the last 72 hours. Thyroid Function Tests: Recent Labs    10/30/19 1951  TSH 1.826   Anemia Panel: No results for input(s): VITAMINB12, FOLATE, FERRITIN, TIBC, IRON, RETICCTPCT in the last 72 hours. Urine analysis:    Component Value Date/Time   COLORURINE YELLOW 01/16/2017 1420   APPEARANCEUR CLEAR 01/16/2017 1420   LABSPEC 1.013 01/16/2017 1420   PHURINE 5.0 01/16/2017 1420   GLUCOSEU NEGATIVE 01/16/2017 1420   HGBUR SMALL (A) 01/16/2017 1420   BILIRUBINUR Negative 03/19/2019 1354   KETONESUR negative 08/23/2018 0802   KETONESUR NEGATIVE 01/16/2017 1420   PROTEINUR Positive (A) 03/19/2019 1354   PROTEINUR 100 (A) 01/16/2017 1420   UROBILINOGEN 0.2 03/19/2019 1354   UROBILINOGEN 0.2 02/26/2015 1028   NITRITE Negative 03/19/2019 1354   NITRITE NEGATIVE 01/16/2017 1420   LEUKOCYTESUR Negative 03/19/2019 1354    Radiological Exams on Admission - Personally Reviewed: CT Code Stroke CTA Head W/WO contrast  Addendum Date: 10/30/2019   ADDENDUM REPORT: 10/30/2019 21:59 ADDENDUM: In addition to the  initially described findings, note is made of a 2.5 x 1.6 cm irregular nodule at the medial left upper lobe. This is described in the initial finding section of the report, but omitted from the impression by mistake. Further evaluation with dedicated  cross-sectional imaging of the chest recommended for complete characterization. Underlying emphysema noted as well. Emphysema (ICD10-J43.9). Addended findings were communicated by telephone at the time of interpretation on 10/30/2019 at 9:56 pm to Dr. Curly Shores, who verbally acknowledged these results. Electronically Signed   By: Jeannine Boga M.D.   On: 10/30/2019 21:59   Result Date: 10/30/2019 CLINICAL DATA:  Initial evaluation for acute aphasia. EXAM: CT ANGIOGRAPHY HEAD AND NECK CT PERFUSION BRAIN TECHNIQUE: Multidetector CT imaging of the head and neck was performed using the standard protocol during bolus administration of intravenous contrast. Multiplanar CT image reconstructions and MIPs were obtained to evaluate the vascular anatomy. Carotid stenosis measurements (when applicable) are obtained utilizing NASCET criteria, using the distal internal carotid diameter as the denominator. Multiphase CT imaging of the brain was performed following IV bolus contrast injection. Subsequent parametric perfusion maps were calculated using RAPID software. CONTRAST:  190mL OMNIPAQUE IOHEXOL 350 MG/ML SOLN COMPARISON:  Prior head CT from earlier the same day. FINDINGS: CTA NECK FINDINGS Aortic arch: Visualized aortic arch of normal caliber with normal branch pattern. Mild atheromatous change seen about the arch and origin of the great vessels without hemodynamically significant stenosis or other acute finding. Right carotid system: Right common and internal carotid arteries patent without stenosis, dissection, or occlusion. Mild atheromatous plaque about the right bifurcation without significant stenosis. Left carotid system: Left common and internal carotid arteries  patent without stenosis, dissection or occlusion. Mild atheromatous change about the left bifurcation/proximal left ICA without hemodynamically significant stenosis. Vertebral arteries: Both vertebral arteries arise from the subclavian arteries. No significant proximal subclavian artery stenosis. Left vertebral artery dominant with a diffusely hypoplastic right vertebral artery. Atheromatous plaque at the origin of the left vertebral artery with approximate 50% ostial stenosis. Vertebral arteries otherwise widely patent within the neck without stenosis, dissection or occlusion. Skeleton: No visible acute osseous abnormality. No discrete lytic or blastic osseous lesions. Moderate cervical spondylosis noted at C6-7. Other neck: No other acute soft tissue abnormality within the neck. No mass lesion or adenopathy. Upper chest: Emphysema. Irregular nodular density measuring approximately 2.5 x 1.6 cm at the medial left upper lobe (series 20, image 178), indeterminate. Review of the MIP images confirms the above findings CTA HEAD FINDINGS Anterior circulation: Both internal carotid arteries patent to the termini without significant stenosis or other abnormality. Left A1 widely patent. Right A1 hypoplastic and/or absent, accounting for the diminutive right ICA is compared to the left. Normal anterior communicating artery. Both anterior cerebral arteries patent to their distal aspects without stenosis. No M1 stenosis or occlusion. Normal MCA bifurcations. No proximal M2 branch occlusion. Distal MCA branches well perfused and symmetric. Posterior circulation: Dominant left vertebral artery widely patent to the vertebrobasilar junction. Patent left PICA. Hypoplastic right vertebral artery terminates in PICA. Right PICA patent as well. Basilar widely patent to its distal aspect without stenosis. Superior cerebral arteries patent bilaterally. Both PCAs well perfused to their distal aspects without stenosis. Small bilateral  posterior communicating arteries noted. Venous sinuses: Grossly patent allowing for timing the contrast bolus. Anatomic variants: Hypoplastic/absent right A1 segment. Hypoplastic right vertebral artery terminates in PICA. No aneurysm. Review of the MIP images confirms the above findings CT Brain Perfusion Findings: ASPECTS: 10. CBF (<30%) Volume: 9mL Perfusion (Tmax>6.0s) volume: 9mL Mismatch Volume: 22mL Infarction Location:Negative CT perfusion with no evidence for acute core infarct or other perfusion abnormality. IMPRESSION: CTA HEAD AND NECK IMPRESSION: 1. Negative CTA of the head and neck for emergent large vessel occlusion. 2.  Mild atheromatous change about the aortic arch and carotid bifurcations without hemodynamically significant stenosis. 3. 50% atheromatous stenosis at the origin of the left vertebral artery. Left vertebral artery dominant. Right vertebral artery diffusely hypoplastic and terminates in PICA. CT PERFUSION IMPRESSION: Negative CT perfusion. No evidence for core infarct or other perfusion abnormality. Critical Value/emergent results were called by telephone at the time of interpretation on 10/30/2019 at 6:35 pm to provider MCNEILL [facility] , who verbally acknowledged these results. Electronically Signed: By: Jeannine Boga M.D. On: 10/30/2019 19:04   DG Chest 1 View  Result Date: 10/30/2019 CLINICAL DATA:  Elevated blood pressure. EXAM: CHEST  1 VIEW COMPARISON:  April 26, 2019 FINDINGS: The study is limited secondary to patient rotation. Mild perihilar pulmonary vascular prominence is noted. There is no evidence of acute infiltrate, pleural effusion or pneumothorax. There is moderate severity enlargement of the cardiac silhouette. The visualized skeletal structures are unremarkable. IMPRESSION: Cardiomegaly with mild pulmonary vascular congestion. Electronically Signed   By: Virgina Norfolk M.D.   On: 10/30/2019 23:14   CT Code Stroke CTA Neck W/WO contrast  Addendum  Date: 10/30/2019   ADDENDUM REPORT: 10/30/2019 21:59 ADDENDUM: In addition to the initially described findings, note is made of a 2.5 x 1.6 cm irregular nodule at the medial left upper lobe. This is described in the initial finding section of the report, but omitted from the impression by mistake. Further evaluation with dedicated cross-sectional imaging of the chest recommended for complete characterization. Underlying emphysema noted as well. Emphysema (ICD10-J43.9). Addended findings were communicated by telephone at the time of interpretation on 10/30/2019 at 9:56 pm to Dr. Curly Shores, who verbally acknowledged these results. Electronically Signed   By: Jeannine Boga M.D.   On: 10/30/2019 21:59   Result Date: 10/30/2019 CLINICAL DATA:  Initial evaluation for acute aphasia. EXAM: CT ANGIOGRAPHY HEAD AND NECK CT PERFUSION BRAIN TECHNIQUE: Multidetector CT imaging of the head and neck was performed using the standard protocol during bolus administration of intravenous contrast. Multiplanar CT image reconstructions and MIPs were obtained to evaluate the vascular anatomy. Carotid stenosis measurements (when applicable) are obtained utilizing NASCET criteria, using the distal internal carotid diameter as the denominator. Multiphase CT imaging of the brain was performed following IV bolus contrast injection. Subsequent parametric perfusion maps were calculated using RAPID software. CONTRAST:  135mL OMNIPAQUE IOHEXOL 350 MG/ML SOLN COMPARISON:  Prior head CT from earlier the same day. FINDINGS: CTA NECK FINDINGS Aortic arch: Visualized aortic arch of normal caliber with normal branch pattern. Mild atheromatous change seen about the arch and origin of the great vessels without hemodynamically significant stenosis or other acute finding. Right carotid system: Right common and internal carotid arteries patent without stenosis, dissection, or occlusion. Mild atheromatous plaque about the right bifurcation without  significant stenosis. Left carotid system: Left common and internal carotid arteries patent without stenosis, dissection or occlusion. Mild atheromatous change about the left bifurcation/proximal left ICA without hemodynamically significant stenosis. Vertebral arteries: Both vertebral arteries arise from the subclavian arteries. No significant proximal subclavian artery stenosis. Left vertebral artery dominant with a diffusely hypoplastic right vertebral artery. Atheromatous plaque at the origin of the left vertebral artery with approximate 50% ostial stenosis. Vertebral arteries otherwise widely patent within the neck without stenosis, dissection or occlusion. Skeleton: No visible acute osseous abnormality. No discrete lytic or blastic osseous lesions. Moderate cervical spondylosis noted at C6-7. Other neck: No other acute soft tissue abnormality within the neck. No mass lesion or adenopathy. Upper chest: Emphysema.  Irregular nodular density measuring approximately 2.5 x 1.6 cm at the medial left upper lobe (series 20, image 178), indeterminate. Review of the MIP images confirms the above findings CTA HEAD FINDINGS Anterior circulation: Both internal carotid arteries patent to the termini without significant stenosis or other abnormality. Left A1 widely patent. Right A1 hypoplastic and/or absent, accounting for the diminutive right ICA is compared to the left. Normal anterior communicating artery. Both anterior cerebral arteries patent to their distal aspects without stenosis. No M1 stenosis or occlusion. Normal MCA bifurcations. No proximal M2 branch occlusion. Distal MCA branches well perfused and symmetric. Posterior circulation: Dominant left vertebral artery widely patent to the vertebrobasilar junction. Patent left PICA. Hypoplastic right vertebral artery terminates in PICA. Right PICA patent as well. Basilar widely patent to its distal aspect without stenosis. Superior cerebral arteries patent bilaterally.  Both PCAs well perfused to their distal aspects without stenosis. Small bilateral posterior communicating arteries noted. Venous sinuses: Grossly patent allowing for timing the contrast bolus. Anatomic variants: Hypoplastic/absent right A1 segment. Hypoplastic right vertebral artery terminates in PICA. No aneurysm. Review of the MIP images confirms the above findings CT Brain Perfusion Findings: ASPECTS: 10. CBF (<30%) Volume: 91mL Perfusion (Tmax>6.0s) volume: 90mL Mismatch Volume: 11mL Infarction Location:Negative CT perfusion with no evidence for acute core infarct or other perfusion abnormality. IMPRESSION: CTA HEAD AND NECK IMPRESSION: 1. Negative CTA of the head and neck for emergent large vessel occlusion. 2. Mild atheromatous change about the aortic arch and carotid bifurcations without hemodynamically significant stenosis. 3. 50% atheromatous stenosis at the origin of the left vertebral artery. Left vertebral artery dominant. Right vertebral artery diffusely hypoplastic and terminates in PICA. CT PERFUSION IMPRESSION: Negative CT perfusion. No evidence for core infarct or other perfusion abnormality. Critical Value/emergent results were called by telephone at the time of interpretation on 10/30/2019 at 6:35 pm to provider MCNEILL [facility] , who verbally acknowledged these results. Electronically Signed: By: Jeannine Boga M.D. On: 10/30/2019 19:04   MR BRAIN WO CONTRAST  Result Date: 10/30/2019 CLINICAL DATA:  59 year old male code stroke presentation today. Aphasia. EXAM: MRI HEAD WITHOUT CONTRAST TECHNIQUE: Multiplanar, multiecho pulse sequences of the brain and surrounding structures were obtained without intravenous contrast. COMPARISON:  CT head, CTA and CTP today. FINDINGS: The examination had to be discontinued prior to completion due to patient inability to continue. Axial DWI and FLAIR only obtained. No restricted diffusion to suggest acute infarction. No intracranial mass effect. No  ventriculomegaly. FLAIR images demonstrate patchy mild to moderate for age nonspecific bilateral cerebral white matter hyperintensity. No cortical encephalomalacia identified. FLAIR signal elsewhere within normal limits. However, trace DWI images suggest chronic microhemorrhage in the dorsal left thalamus (susceptibility on series 2, image 28), and perhaps also in the right cerebellum. IMPRESSION: 1. Truncated exam, negative for acute infarct. 2. Mild to moderate for age nonspecific white matter signal changes. Several chronic microhemorrhages suspected. Electronically Signed   By: Genevie Ann M.D.   On: 10/30/2019 19:15   CT CEREBRAL PERFUSION W CONTRAST  Addendum Date: 10/30/2019   ADDENDUM REPORT: 10/30/2019 21:59 ADDENDUM: In addition to the initially described findings, note is made of a 2.5 x 1.6 cm irregular nodule at the medial left upper lobe. This is described in the initial finding section of the report, but omitted from the impression by mistake. Further evaluation with dedicated cross-sectional imaging of the chest recommended for complete characterization. Underlying emphysema noted as well. Emphysema (ICD10-J43.9). Addended findings were communicated by telephone at the time of  interpretation on 10/30/2019 at 9:56 pm to Dr. Curly Shores, who verbally acknowledged these results. Electronically Signed   By: Jeannine Boga M.D.   On: 10/30/2019 21:59   Result Date: 10/30/2019 CLINICAL DATA:  Initial evaluation for acute aphasia. EXAM: CT ANGIOGRAPHY HEAD AND NECK CT PERFUSION BRAIN TECHNIQUE: Multidetector CT imaging of the head and neck was performed using the standard protocol during bolus administration of intravenous contrast. Multiplanar CT image reconstructions and MIPs were obtained to evaluate the vascular anatomy. Carotid stenosis measurements (when applicable) are obtained utilizing NASCET criteria, using the distal internal carotid diameter as the denominator. Multiphase CT imaging of the  brain was performed following IV bolus contrast injection. Subsequent parametric perfusion maps were calculated using RAPID software. CONTRAST:  179mL OMNIPAQUE IOHEXOL 350 MG/ML SOLN COMPARISON:  Prior head CT from earlier the same day. FINDINGS: CTA NECK FINDINGS Aortic arch: Visualized aortic arch of normal caliber with normal branch pattern. Mild atheromatous change seen about the arch and origin of the great vessels without hemodynamically significant stenosis or other acute finding. Right carotid system: Right common and internal carotid arteries patent without stenosis, dissection, or occlusion. Mild atheromatous plaque about the right bifurcation without significant stenosis. Left carotid system: Left common and internal carotid arteries patent without stenosis, dissection or occlusion. Mild atheromatous change about the left bifurcation/proximal left ICA without hemodynamically significant stenosis. Vertebral arteries: Both vertebral arteries arise from the subclavian arteries. No significant proximal subclavian artery stenosis. Left vertebral artery dominant with a diffusely hypoplastic right vertebral artery. Atheromatous plaque at the origin of the left vertebral artery with approximate 50% ostial stenosis. Vertebral arteries otherwise widely patent within the neck without stenosis, dissection or occlusion. Skeleton: No visible acute osseous abnormality. No discrete lytic or blastic osseous lesions. Moderate cervical spondylosis noted at C6-7. Other neck: No other acute soft tissue abnormality within the neck. No mass lesion or adenopathy. Upper chest: Emphysema. Irregular nodular density measuring approximately 2.5 x 1.6 cm at the medial left upper lobe (series 20, image 178), indeterminate. Review of the MIP images confirms the above findings CTA HEAD FINDINGS Anterior circulation: Both internal carotid arteries patent to the termini without significant stenosis or other abnormality. Left A1 widely  patent. Right A1 hypoplastic and/or absent, accounting for the diminutive right ICA is compared to the left. Normal anterior communicating artery. Both anterior cerebral arteries patent to their distal aspects without stenosis. No M1 stenosis or occlusion. Normal MCA bifurcations. No proximal M2 branch occlusion. Distal MCA branches well perfused and symmetric. Posterior circulation: Dominant left vertebral artery widely patent to the vertebrobasilar junction. Patent left PICA. Hypoplastic right vertebral artery terminates in PICA. Right PICA patent as well. Basilar widely patent to its distal aspect without stenosis. Superior cerebral arteries patent bilaterally. Both PCAs well perfused to their distal aspects without stenosis. Small bilateral posterior communicating arteries noted. Venous sinuses: Grossly patent allowing for timing the contrast bolus. Anatomic variants: Hypoplastic/absent right A1 segment. Hypoplastic right vertebral artery terminates in PICA. No aneurysm. Review of the MIP images confirms the above findings CT Brain Perfusion Findings: ASPECTS: 10. CBF (<30%) Volume: 24mL Perfusion (Tmax>6.0s) volume: 9mL Mismatch Volume: 51mL Infarction Location:Negative CT perfusion with no evidence for acute core infarct or other perfusion abnormality. IMPRESSION: CTA HEAD AND NECK IMPRESSION: 1. Negative CTA of the head and neck for emergent large vessel occlusion. 2. Mild atheromatous change about the aortic arch and carotid bifurcations without hemodynamically significant stenosis. 3. 50% atheromatous stenosis at the origin of the left vertebral artery.  Left vertebral artery dominant. Right vertebral artery diffusely hypoplastic and terminates in PICA. CT PERFUSION IMPRESSION: Negative CT perfusion. No evidence for core infarct or other perfusion abnormality. Critical Value/emergent results were called by telephone at the time of interpretation on 10/30/2019 at 6:35 pm to provider [facility]NE[facility] , who  verbally acknowledged these results. Electronically Signed: By: Jeannine Boga M.D. On: 10/30/2019 19:04   EEG adult  Result Date: 10/30/2019 Greta Doom, MD     10/30/2019  9:10 PM History: 59 year old male presenting with aphasia Sedation: 2 mg Ativan given few hours prior to EEG Technique: This is a 21 channel routine scalp EEG performed at the bedside with bipolar and monopolar montages arranged in accordance to the international 10/20 system of electrode placement. One channel was dedicated to EKG recording. Background: There is a posterior dominant rhythm of 7 to 8 Hz which is seen bilaterally.  In addition, there is admixed generalized irregular delta and theta range activities.  Clear sleep structures are not recorded. Photic stimulation: Physiologic driving is now performed EEG Abnormalities: 1) generalized irregular slow activity 2) slow posterior dominant rhythm Clinical Interpretation: This EEG is consistent with a generalized nonspecific cerebral dysfunction (encephalopathy). There was no seizure or seizure predisposition recorded on this study. Please note that lack of epileptiform activity on EEG does not preclude the possibility of epilepsy. Roland Rack, MD [facility] [phone] If 7pm- 7am, please page neurology on call as listed in [place].   CT HEAD CODE STROKE WO CONTRAST  Result Date: 10/30/2019 CLINICAL DATA:  Code stroke.  Aphasia EXAM: CT HEAD WITHOUT CONTRAST TECHNIQUE: Contiguous axial images were obtained from the base of the skull through the vertex without intravenous contrast. COMPARISON:  02/17/2019 FINDINGS: Brain: Normal appearance without evidence of atrophy, old or acute infarction, mass lesion, hemorrhage, hydrocephalus or extra-axial collection. Vascular: There is atherosclerotic calcification of the major vessels at the base of the brain. Skull: Negative Sinuses/Orbits: Clear/normal Other: None ASPECTS ([place] Stroke Program Early  CT Score) - Ganglionic level infarction (caudate, lentiform nuclei, internal capsule, insula, M1-M3 cortex): 7 - Supraganglionic infarction (M4-M6 cortex): 3 Total score (0-10 with 10 being normal): 10 IMPRESSION: 1. Normal head CT for age. Atherosclerotic calcification of the major vessels at the base of the brain. 2. ASPECTS is 10. 3. These results were communicated to Dr. Leonel Ramsay at 5:31 pmon 10/19/2021by text page via the [facility] messaging system. Electronically Signed   By: Nelson Chimes M.D.   On: 10/30/2019 17:32    EKG: Personally reviewed.  Rhythm is normal sinus rhythm with heart rate of 77 bpm.  Evidence of left ventricular hypertrophy and prolonged QTC of 506.  T wave inversions noted in the lateral leads.    Assessment/Plan Principal Problem:   Hypertensive emergency   Patient presenting with markedly elevated blood pressures and concern for hypertensive emergency considering neurologic findings and concern for hypertensive encephalopathy.  Initial systolic blood pressures ranging between 749S and 496 systolic in the emergency department..  We have been managing patient on intravenous clevidipine infusion for several hours in the emergency department, achieving an average blood pressure in the 170s.  After discussing case with pharmacy, we will attempt to transition patient now to scheduled intravenous metoprolol and hydralazine based on patient's home regimen of oral carvedilol and hydralazine.  Patient will additionally be provided with as needed intravenous labetalol for markedly elevated blood pressures.  Considering that I am uncertain about patient's usual compliance with his antihypertensives, target 25% blood pressure reduction  in the first 24 hours of hospitalization -I have placed parameters reflecting this which will need to be adjusted by the daytime provider in the morning.  Considering patient's ongoing expressive aphasia and lethargy, patient will be kept n.p.o.  and will eventually transition patient to oral antihypertensives once patient is cleared by speech therapy.  Active Problems:   Acute encephalopathy   Patient presenting with substantial lethargy, confusion and expressive aphasia  MRI brain reveals no evidence of acute stroke with exception of small chronic microhemorrhages suggestive of longstanding uncontrolled blood pressure.  EEG has been performed in the emergency department revealing findings consistent with encephalopathy without evidence of seizure or seizure predisposition.  Urinalysis, ammonia, TSH, urine toxicology screen, vitamin B12, folate, VBG pending.  Performing neurologic checks every 4 hours  Monitoring for clinical improvement with slow improvement in blood pressures.    Elevated troponin level not due myocardial infarction   Elevated troponin likely secondary to severe hypertension in the setting of end-stage renal disease  Patient is chest pain-free, no evidence of dynamic ST segment changes on EKG  Monitoring patient on telemetry  Cycling cardiac enzymes    ESRD (end stage renal disease) on dialysis [facility])   We will consult nephrology in the morning for consideration of consumption of dialysis during this hospitalization.  Patient did undergo dialysis on 10/19.    Chronic combined systolic and diastolic congestive heart failure ([place])    While patient is very hypertensive, no evidence of cardiogenic volume overload at this time.  Hypoxia    Nursing reports transient bout of hypoxia in the emergency department  Obtaining chest x-ray, VBG.  Mediastinal Mass and Left Upper lobe nodule   There is mention of a 2.5 x 1.6 irregular nodule in the left upper lobe.  Patient is known history of large mediastinal mass diagnosed in 2019, monitored with CT surveillance in 2020 as well as being evaluated via FNA biopsy consistent with cystic structure.  Left upper lobe nodules were also identified at the  same time on CT imaging  Patient is following with pulmonology at [facility].  Code Status:  Full code Family Communication: I have attempted to call Mia the patient's daughter but have been unsuccessful in contacting her - no answer.  Status is: Observation  The patient remains OBS appropriate and will d/c before 2 midnights.  Dispo: The patient is from: Home              Anticipated d/c is to: Home              Anticipated d/c date is: 2 days              Patient currently is not medically stable to d/c.        Vernelle Emerald MD [facility] Pager [phone]  If 7PM-7AM, please contact night-coverage [email or web address] Use universal [place] password for that web site. If you do not have the password, please call the hospital operator.  10/30/2019, 11:38 PM

## 2019-10-30 NOTE — ED Notes (Signed)
Pt moving around in ct, not following directions, sitting up in scanner. Ativan ordered and give n per dr. Tobias Alexander.

## 2019-10-30 NOTE — ED Notes (Signed)
Dr. Leonel Ramsay speaking w/family

## 2019-10-30 NOTE — ED Notes (Signed)
EEG at bedside.

## 2019-10-31 ENCOUNTER — Other Ambulatory Visit: Payer: Self-pay

## 2019-10-31 DIAGNOSIS — R599 Enlarged lymph nodes, unspecified: Secondary | ICD-10-CM | POA: Diagnosis not present

## 2019-10-31 DIAGNOSIS — I248 Other forms of acute ischemic heart disease: Secondary | ICD-10-CM | POA: Diagnosis not present

## 2019-10-31 DIAGNOSIS — J984 Other disorders of lung: Secondary | ICD-10-CM | POA: Diagnosis present

## 2019-10-31 DIAGNOSIS — J439 Emphysema, unspecified: Secondary | ICD-10-CM | POA: Diagnosis present

## 2019-10-31 DIAGNOSIS — I161 Hypertensive emergency: Principal | ICD-10-CM

## 2019-10-31 DIAGNOSIS — I674 Hypertensive encephalopathy: Secondary | ICD-10-CM | POA: Diagnosis present

## 2019-10-31 DIAGNOSIS — I43 Cardiomyopathy in diseases classified elsewhere: Secondary | ICD-10-CM | POA: Diagnosis present

## 2019-10-31 DIAGNOSIS — I160 Hypertensive urgency: Secondary | ICD-10-CM | POA: Diagnosis not present

## 2019-10-31 DIAGNOSIS — Z87891 Personal history of nicotine dependence: Secondary | ICD-10-CM | POA: Diagnosis not present

## 2019-10-31 DIAGNOSIS — E7220 Disorder of urea cycle metabolism, unspecified: Secondary | ICD-10-CM | POA: Diagnosis present

## 2019-10-31 DIAGNOSIS — R4182 Altered mental status, unspecified: Secondary | ICD-10-CM | POA: Diagnosis not present

## 2019-10-31 DIAGNOSIS — Z20822 Contact with and (suspected) exposure to covid-19: Secondary | ICD-10-CM | POA: Diagnosis not present

## 2019-10-31 DIAGNOSIS — N250 Renal osteodystrophy: Secondary | ICD-10-CM | POA: Diagnosis not present

## 2019-10-31 DIAGNOSIS — Z8249 Family history of ischemic heart disease and other diseases of the circulatory system: Secondary | ICD-10-CM | POA: Diagnosis not present

## 2019-10-31 DIAGNOSIS — R4701 Aphasia: Secondary | ICD-10-CM | POA: Diagnosis not present

## 2019-10-31 DIAGNOSIS — R0902 Hypoxemia: Secondary | ICD-10-CM | POA: Diagnosis present

## 2019-10-31 DIAGNOSIS — Z992 Dependence on renal dialysis: Secondary | ICD-10-CM | POA: Diagnosis not present

## 2019-10-31 DIAGNOSIS — Z823 Family history of stroke: Secondary | ICD-10-CM | POA: Diagnosis not present

## 2019-10-31 DIAGNOSIS — D631 Anemia in chronic kidney disease: Secondary | ICD-10-CM | POA: Diagnosis not present

## 2019-10-31 DIAGNOSIS — E8770 Fluid overload, unspecified: Secondary | ICD-10-CM | POA: Diagnosis present

## 2019-10-31 DIAGNOSIS — N186 End stage renal disease: Secondary | ICD-10-CM | POA: Diagnosis not present

## 2019-10-31 DIAGNOSIS — R590 Localized enlarged lymph nodes: Secondary | ICD-10-CM | POA: Diagnosis present

## 2019-10-31 DIAGNOSIS — I132 Hypertensive heart and chronic kidney disease with heart failure and with stage 5 chronic kidney disease, or end stage renal disease: Secondary | ICD-10-CM | POA: Diagnosis not present

## 2019-10-31 DIAGNOSIS — Z79899 Other long term (current) drug therapy: Secondary | ICD-10-CM | POA: Diagnosis not present

## 2019-10-31 DIAGNOSIS — I5042 Chronic combined systolic (congestive) and diastolic (congestive) heart failure: Secondary | ICD-10-CM | POA: Diagnosis not present

## 2019-10-31 DIAGNOSIS — Z833 Family history of diabetes mellitus: Secondary | ICD-10-CM | POA: Diagnosis not present

## 2019-10-31 LAB — COMPREHENSIVE METABOLIC PANEL
ALT: 15 U/L (ref 0–44)
AST: 18 U/L (ref 15–41)
Albumin: 3.7 g/dL (ref 3.5–5.0)
Alkaline Phosphatase: 30 U/L — ABNORMAL LOW (ref 38–126)
Anion gap: 13 (ref 5–15)
BUN: 59 mg/dL — ABNORMAL HIGH (ref 6–20)
CO2: 27 mmol/L (ref 22–32)
Calcium: 9.5 mg/dL (ref 8.9–10.3)
Chloride: 94 mmol/L — ABNORMAL LOW (ref 98–111)
Creatinine, Ser: 10.27 mg/dL — ABNORMAL HIGH (ref 0.61–1.24)
GFR, Estimated: 5 mL/min — ABNORMAL LOW (ref >60)
Glucose, Bld: 91 mg/dL (ref 70–99)
Potassium: 5 mmol/L (ref 3.5–5.1)
Sodium: 134 mmol/L — ABNORMAL LOW (ref 135–145)
Total Bilirubin: 1 mg/dL (ref 0.3–1.2)
Total Protein: 6.8 g/dL (ref 6.5–8.1)

## 2019-10-31 LAB — I-STAT VENOUS BLOOD GAS, ED
Acid-Base Excess: 4 mmol/L — ABNORMAL HIGH (ref 0.0–2.0)
Bicarbonate: 29.7 mmol/L — ABNORMAL HIGH (ref 20.0–28.0)
Calcium, Ion: 1.15 mmol/L (ref 1.15–1.40)
HCT: 32 % — ABNORMAL LOW (ref 39.0–52.0)
Hemoglobin: 10.9 g/dL — ABNORMAL LOW (ref 13.0–17.0)
O2 Saturation: 78 %
Potassium: 5 mmol/L (ref 3.5–5.1)
Sodium: 136 mmol/L (ref 135–145)
TCO2: 31 mmol/L (ref 22–32)
pCO2, Ven: 50.1 mmHg (ref 44.0–60.0)
pH, Ven: 7.381 (ref 7.250–7.430)
pO2, Ven: 44 mmHg (ref 32.0–45.0)

## 2019-10-31 LAB — CBC WITH DIFFERENTIAL/PLATELET
Abs Immature Granulocytes: 0.01 10*3/uL (ref 0.00–0.07)
Basophils Absolute: 0.1 10*3/uL (ref 0.0–0.1)
Basophils Relative: 2 %
Eosinophils Absolute: 0.3 10*3/uL (ref 0.0–0.5)
Eosinophils Relative: 9 %
HCT: 31.4 % — ABNORMAL LOW (ref 39.0–52.0)
Hemoglobin: 10.2 g/dL — ABNORMAL LOW (ref 13.0–17.0)
Immature Granulocytes: 0 %
Lymphocytes Relative: 16 %
Lymphs Abs: 0.6 10*3/uL — ABNORMAL LOW (ref 0.7–4.0)
MCH: 30.4 pg (ref 26.0–34.0)
MCHC: 32.5 g/dL (ref 30.0–36.0)
MCV: 93.7 fL (ref 80.0–100.0)
Monocytes Absolute: 0.4 10*3/uL (ref 0.1–1.0)
Monocytes Relative: 9 %
Neutro Abs: 2.5 10*3/uL (ref 1.7–7.7)
Neutrophils Relative %: 64 %
Platelets: 181 10*3/uL (ref 150–400)
RBC: 3.35 MIL/uL — ABNORMAL LOW (ref 4.22–5.81)
RDW: 12.9 % (ref 11.5–15.5)
WBC: 3.9 10*3/uL — ABNORMAL LOW (ref 4.0–10.5)
nRBC: 0 % (ref 0.0–0.2)

## 2019-10-31 LAB — RAPID URINE DRUG SCREEN, HOSP PERFORMED
Amphetamines: NOT DETECTED
Barbiturates: NOT DETECTED
Benzodiazepines: NOT DETECTED
Cocaine: NOT DETECTED
Opiates: NOT DETECTED
Tetrahydrocannabinol: NOT DETECTED

## 2019-10-31 LAB — VITAMIN B12: Vitamin B-12: 410 pg/mL (ref 180–914)

## 2019-10-31 LAB — FOLATE: Folate: 33.5 ng/mL (ref >5.9)

## 2019-10-31 LAB — TROPONIN I (HIGH SENSITIVITY)
Troponin I (High Sensitivity): 170 ng/L (ref <18)
Troponin I (High Sensitivity): 153 ng/L (ref <18)

## 2019-10-31 MED ORDER — HYDRALAZINE HCL 20 MG/ML IJ SOLN
10.0000 mg | Freq: Four times a day (QID) | INTRAMUSCULAR | Status: DC
  Administered 2019-10-31: 10 mg via INTRAVENOUS
  Filled 2019-10-31: qty 1

## 2019-10-31 MED ORDER — POLYVINYL ALCOHOL 1.4 % OP SOLN
1.0000 [drp] | OPHTHALMIC | Status: DC | PRN
  Filled 2019-10-31: qty 15

## 2019-10-31 MED ORDER — AMLODIPINE BESYLATE 5 MG PO TABS
10.0000 mg | ORAL_TABLET | Freq: Every day | ORAL | Status: DC
Start: 2019-10-31 — End: 2019-10-31

## 2019-10-31 MED ORDER — HYDRALAZINE HCL 20 MG/ML IJ SOLN
10.0000 mg | INTRAMUSCULAR | Status: DC | PRN
  Administered 2019-10-31 – 2019-11-01 (×2): 10 mg via INTRAVENOUS
  Filled 2019-10-31 (×3): qty 1

## 2019-10-31 MED ORDER — CARVEDILOL 25 MG PO TABS
25.0000 mg | ORAL_TABLET | Freq: Two times a day (BID) | ORAL | Status: DC
  Administered 2019-10-31 – 2019-11-03 (×7): 25 mg via ORAL
  Filled 2019-10-31 (×2): qty 1
  Filled 2019-10-31: qty 2
  Filled 2019-10-31: qty 1
  Filled 2019-10-31: qty 2
  Filled 2019-10-31 (×2): qty 1

## 2019-10-31 MED ORDER — DOXERCALCIFEROL 4 MCG/2ML IV SOLN
2.0000 ug | INTRAVENOUS | Status: DC
  Filled 2019-10-31 (×2): qty 2

## 2019-10-31 MED ORDER — SODIUM CHLORIDE 0.9 % IV SOLN
62.5000 mg | INTRAVENOUS | Status: DC
  Administered 2019-10-31: 62.5 mg via INTRAVENOUS
  Filled 2019-10-31: qty 5

## 2019-10-31 MED ORDER — AMLODIPINE BESYLATE 10 MG PO TABS
10.0000 mg | ORAL_TABLET | Freq: Every day | ORAL | Status: DC
  Administered 2019-10-31 – 2019-11-03 (×4): 10 mg via ORAL
  Filled 2019-10-31 (×3): qty 1
  Filled 2019-10-31: qty 2

## 2019-10-31 MED ORDER — DARBEPOETIN ALFA 40 MCG/0.4ML IJ SOSY
40.0000 ug | PREFILLED_SYRINGE | INTRAMUSCULAR | Status: DC
  Filled 2019-10-31: qty 0.4

## 2019-10-31 MED ORDER — LABETALOL HCL 5 MG/ML IV SOLN
10.0000 mg | INTRAVENOUS | Status: DC | PRN
  Administered 2019-10-31 (×2): 10 mg via INTRAVENOUS
  Filled 2019-10-31 (×2): qty 4

## 2019-10-31 MED ORDER — CHLORHEXIDINE GLUCONATE CLOTH 2 % EX PADS
6.0000 | MEDICATED_PAD | Freq: Every day | CUTANEOUS | Status: DC
  Administered 2019-10-31 – 2019-11-03 (×3): 6 via TOPICAL

## 2019-10-31 MED ORDER — HYDRALAZINE HCL 50 MG PO TABS
50.0000 mg | ORAL_TABLET | Freq: Three times a day (TID) | ORAL | Status: DC
  Administered 2019-10-31 – 2019-11-02 (×5): 50 mg via ORAL
  Filled 2019-10-31 (×3): qty 1
  Filled 2019-10-31: qty 2
  Filled 2019-10-31: qty 1

## 2019-10-31 NOTE — ED Notes (Signed)
Meal tray ordered 

## 2019-10-31 NOTE — Progress Notes (Signed)
Neurology Progress Note   S:// Patient seen and examined.  Continues to be roomed in the emergency room.  Denies any acute problems.  Denies headaches.  Denies any speech issues.   O:// Current vital signs: BP (!) 218/103   Pulse 72   Temp 98 F (36.7 C)   Resp (!) 24   Ht 6\' 2"  (1.88 m)   Wt 81 kg   SpO2 96%   BMI 22.93 kg/m  Vital signs in last 24 hours: Temp:  [98 F (36.7 C)] 98 F (36.7 C) (10/20 0700) Pulse Rate:  [33-91] 72 (10/20 0800) Resp:  [15-32] 24 (10/20 0800) BP: (148-261)/(84-198) 218/103 (10/20 0800) SpO2:  [63 %-100 %] 96 % (10/20 0800) Weight:  [78.5 kg-81 kg] 81 kg (10/19 1924) GENERAL: Awake, alert in NAD HEENT: - Normocephalic and atraumatic, dry mm, no LN++, no Thyromegally LUNGS - Clear to auscultation bilaterally with no wheezes CV - S1S2 RRR, no m/r/g, equal pulses bilaterally. ABDOMEN - Soft, nontender, nondistended with normoactive BS Ext: warm, well perfused, intact peripheral pulses, no edema NEURO:  Mental Status: AA&Ox3  Language: speech is clear and has no evidence of dysarthria.  Cranial Nerves: PERRL. EOMI, visual fields full, no facial asymmetry, facial sensation intact, hearing intact, tongue/uvula/soft palate midline, normal sternocleidomastoid and trapezius muscle strength. No evidence of tongue atrophy or fibrillations Motor: 5/5 in all fours.  Mild asterixis on outstretched arms. Tone: is normal and bulk is normal Sensation- Intact to light touch bilaterally Coordination: FTN intact bilaterally Gait- deferred   Medications  Current Facility-Administered Medications:  .  acetaminophen (TYLENOL) tablet 650 mg, 650 mg, Oral, Q6H PRN **OR** acetaminophen (TYLENOL) suppository 650 mg, 650 mg, Rectal, Q6H PRN, Shalhoub, Sherryll Burger, MD .  hydrALAZINE (APRESOLINE) injection 10 mg, 10 mg, Intravenous, Q6H, Dessa Phi, DO .  labetalol (NORMODYNE) injection 10 mg, 10 mg, Intravenous, Q2H PRN, Shalhoub, Sherryll Burger, MD, 10 mg at 10/31/19  5027 .  metoprolol tartrate (LOPRESSOR) injection 5 mg, 5 mg, Intravenous, Q6H, Shalhoub, Sherryll Burger, MD, 5 mg at 10/31/19 0408 .  ondansetron (ZOFRAN) tablet 4 mg, 4 mg, Oral, Q6H PRN **OR** ondansetron (ZOFRAN) injection 4 mg, 4 mg, Intravenous, Q6H PRN, Shalhoub, Sherryll Burger, MD  Current Outpatient Medications:  .  amLODipine (NORVASC) 10 MG tablet, Take 10 mg by mouth every evening., Disp: , Rfl:  .  carvedilol (COREG) 25 MG tablet, Take 1 tablet (25 mg total) by mouth 2 (two) times daily with a meal., Disp: 60 tablet, Rfl: 2 .  hydrALAZINE (APRESOLINE) 50 MG tablet, Take 50 mg by mouth 3 (three) times daily., Disp: , Rfl:  .  multivitamin (RENA-VIT) TABS tablet, Take 1 tablet by mouth daily., Disp: , Rfl: 11 .  polyvinyl alcohol (LIQUIFILM TEARS) 1.4 % ophthalmic solution, Place 1 drop into both eyes as needed for dry eyes., Disp: , Rfl:  .  hydrALAZINE (APRESOLINE) 25 MG tablet, Take 1 tablet (25 mg total) by mouth every 8 (eight) hours. (Patient not taking: Reported on 10/31/2019), Disp: 90 tablet, Rfl: 1 Labs CBC    Component Value Date/Time   WBC 3.9 (L) 10/31/2019 0441   RBC 3.35 (L) 10/31/2019 0441   HGB 10.9 (L) 10/31/2019 0442   HGB 9.4 (L) 03/19/2019 1429   HCT 32.0 (L) 10/31/2019 0442   HCT 29.6 (L) 03/19/2019 1429   PLT 181 10/31/2019 0441   PLT 227 03/19/2019 1429   MCV 93.7 10/31/2019 0441   MCV 90 03/19/2019 1429   MCH 30.4  10/31/2019 0441   MCHC 32.5 10/31/2019 0441   RDW 12.9 10/31/2019 0441   RDW 12.5 03/19/2019 1429   LYMPHSABS 0.6 (L) 10/31/2019 0441   LYMPHSABS 0.6 (L) 03/19/2019 1429   MONOABS 0.4 10/31/2019 0441   EOSABS 0.3 10/31/2019 0441   EOSABS 0.6 (H) 03/19/2019 1429   BASOSABS 0.1 10/31/2019 0441   BASOSABS 0.1 03/19/2019 1429    CMP     Component Value Date/Time   NA 136 10/31/2019 0442   NA 137 08/23/2018 0840   K 5.0 10/31/2019 0442   CL 94 (L) 10/31/2019 0230   CO2 27 10/31/2019 0230   GLUCOSE 91 10/31/2019 0230   BUN 59 (H) 10/31/2019  0230   BUN 40 (H) 08/23/2018 0840   CREATININE 10.27 (H) 10/31/2019 0230   CALCIUM 9.5 10/31/2019 0230   PROT 6.8 10/31/2019 0230   PROT 6.6 08/23/2018 0840   ALBUMIN 3.7 10/31/2019 0230   ALBUMIN 4.1 08/23/2018 0840   AST 18 10/31/2019 0230   ALT 15 10/31/2019 0230   ALKPHOS 30 (L) 10/31/2019 0230   BILITOT 1.0 10/31/2019 0230   BILITOT 0.4 08/23/2018 0840   GFRNONAA 5 (L) 10/31/2019 0230   GFRAA 5 (L) 04/29/2019 0353   Ammonia mildly elevated at 37. B12 410 TSH 1.826  Imaging I have reviewed images in epic and the results pertinent to this consultation are: CT head, CTA head and neck, CT perfusion negative for acute process.  Assessment: 59 year old man with acute confusion and presentation consistent with aphasia in the setting of severe hypertension. Continues to have blood pressures in the systolic 480X but the symptoms have completely resolved. Dialysis disequilibrium versus hypertensive emergency remain as the top differentials. Labs revealed mild hyperammonemia, which could have also contributed to the acute confusion EEG negative for seizures.  Recommendations: Aggressive blood pressure management as per hypertensive emergency protocol. No further neurological recommendations at this time. Please recall neurology as needed.  -- Amie Portland, MD [facility] Pager: [phone] If 7pm to 7am, please call on call as listed on AMION.

## 2019-10-31 NOTE — ED Notes (Signed)
Daughter and pt updated on plan of care.

## 2019-10-31 NOTE — Progress Notes (Signed)
Pt has arrived to 2 west 09. Alert and oriented x4, identified appropriately. VS stable, denied chest pain and SOB, no signs of acute distress. Cardiac monitor in place and CCMD notified. Pt oriented to room and equipment, instructed to call for assistance and how to use call bell. Bed alarm in place and call bell left within reach. Will continue to monitor and trteat pt per MD orders.

## 2019-10-31 NOTE — ED Notes (Signed)
Provider made aware of trop increase ... Provider Maylene Roes

## 2019-10-31 NOTE — Evaluation (Signed)
Clinical/Bedside Swallow Evaluation Patient Details  Name: Jason [name]. MRN: [id number] Date of Birth: Sep 11, 1960  Today's Date: 10/31/2019 Time: SLP Start Time (ACUTE ONLY): 1208 SLP Stop Time (ACUTE ONLY): 1218 SLP Time Calculation (min) (ACUTE ONLY): 10 min  Past Medical History:  Past Medical History:  Diagnosis Date  . A-V fistula (HCC)    Right arm  . Abdominal hernia   . AKI (acute kidney injury) ([place]) 01/2015  . Anasarca   . Anemia   . Asthma    as a child  . Bilateral inguinal hernia   . Bilateral pleural effusion 01/2017  . Cellulitis 01/16/2017   Bilateral lower extremity  . CHF (congestive heart failure) ([place])   . Chronic venous insufficiency   . Concentric left ventricular hypertrophy 08/17/2017   Moderated, noted on ECHO  . Diastolic dysfunction 67/89/3810   noted on ECHO  . Elevated LFTs    per patient, resolved  . End stage renal disease [facility])    Dialysis T/Th/Sa  Started 02/2017/pt is waiting for a kidney transplant  . H/O right inguinal hernia repair 11/2017  . History of colonoscopy 03/20/2018  . History of elevated PSA 10/2017  . Hypertension   . Pneumonia   . Pulmonary hypertension (HCC)    Moderate  . Venous stasis ulcer ([place])    bil legs/ healed up now per pt (03/06/18)   Past Surgical History:  Past Surgical History:  Procedure Laterality Date  . AV FISTULA PLACEMENT Right 01/20/2017   Procedure: ARTERIOVENOUS (AV) FISTULA CREATION;  Surgeon: Angelia Mould, MD;  Location: [place];  Service: Vascular;  Laterality: Right;  . HERNIA REPAIR    . INSERTION OF DIALYSIS CATHETER Right 01/20/2017   Procedure: INSERTION OF DIALYSIS CATHETER;  Surgeon: Angelia Mould, MD;  Location: [place];  Service: Vascular;  Laterality: Right;  . INSERTION OF MESH N/A 11/18/2017   Procedure: INSERTION OF MESH;  Surgeon: Michael Boston, MD;  Location: [facility];  Service: General;  Laterality: N/A;  . IR FLUORO GUIDE CV LINE RIGHT  01/18/2017  . IR US  GUIDE VASC ACCESS RIGHT  01/18/2017  . LAPAROSCOPIC INGUINAL HERNIA WITH UMBILICAL HERNIA Bilateral 11/18/2017   Procedure: LAPAROSCOPIC BILATERAL INGUINAL HERNIA REPAIR WITH UMBILICAL HERNIA REPAIR WITH MESH;  Surgeon: Michael Boston, MD;  Location: [facility];  Service: General;  Laterality: Bilateral;  . TOE SURGERY     right fracture in big toe  . TONSILLECTOMY     HPI:  59 year old male with past medical history of end-stage renal disease, hernia repair, hypertension, hypertensive cardiomyopathy with chronic systolic and diastolic just of heart failure, known history of mediastinal mass who presents with sudden onset of lethargy and inability to speak while at dialysis. Per chart the only abnormality found on MRI was evidence of several chronic microhemorrhages and neurology suspected hypertensive encephalopathy.  CXR Cardiomegaly with mild pulmonary vascular congestion.   Assessment / Plan / Recommendation Clinical Impression  Pt's facial and lingual ROM and reported sensation are within normal limits however pt noted to have bilateral round protruding areas at neck suspicous for lymph nodes. Pt and daughter just noticed today- informed RN. Oral and pharyngeal swallow appeared within functional limits without s/s aspiration during 3 oz water or difficulty masticating and transiting boluses. Regular texture and thin liquid diet ordered and reviewed general precautions. No follow up needed at this time.   SLP Visit Diagnosis: Dysphagia, unspecified (R13.10)    Aspiration Risk  No limitations  Diet Recommendation Regular;Thin liquid   Liquid Administration via: Cup;Straw Medication Administration: Whole meds with liquid Supervision: Patient able to self feed Postural Changes: Seated upright at 90 degrees    Other  Recommendations Oral Care Recommendations: Oral care BID   Follow up Recommendations None      Frequency and Duration            Prognosis        Swallow Study    General Date of Onset: 10/30/19 HPI: 59 year old male with past medical history of end-stage renal disease, hernia repair, hypertension, hypertensive cardiomyopathy with chronic systolic and diastolic just of heart failure, known history of mediastinal mass who presents with sudden onset of lethargy and inability to speak while at dialysis. Per chart the only abnormality found on MRI was evidence of several chronic microhemorrhages and neurology suspected hypertensive encephalopathy.  CXR Cardiomegaly with mild pulmonary vascular congestion. Type of Study: Bedside Swallow Evaluation Previous Swallow Assessment:  (none) Diet Prior to this Study: NPO Temperature Spikes Noted: No Respiratory Status: Nasal cannula History of Recent Intubation: No Behavior/Cognition: Alert;Cooperative;Pleasant mood Oral Cavity Assessment: Within Functional Limits (appears to have significantly edematous lymph nodes in neck ) Oral Care Completed by SLP: No Oral Cavity - Dentition: Adequate natural dentition Vision: Functional for self-feeding Self-Feeding Abilities: Able to feed self Patient Positioning: Upright in bed Baseline Vocal Quality: Normal Volitional Cough: Strong Volitional Swallow: Able to elicit    Oral/Motor/Sensory Function Overall Oral Motor/Sensory Function: Within functional limits   Ice Chips Ice chips: Not tested   Thin Liquid Thin Liquid: Within functional limits Presentation: Cup;Straw    Nectar Thick Nectar Thick Liquid: Not tested   Honey Thick Honey Thick Liquid: Not tested   Puree Puree: Within functional limits   Solid     Solid: Within functional limits      Houston Siren 10/31/2019,1:08 PM  Orbie Pyo [place].Ed Risk analyst [phone] Office [phone]

## 2019-10-31 NOTE — Consult Note (Signed)
Renal Service Consult Note [facility]  Jason [name]. 10/31/2019 [name], MD Requesting Physician: Dr Maylene Roes, Jason [name].   Reason for Consult:  ESRD pt w/ uncont HTN HPI: The patient is a 59 y.o. year-old w hx of chron diast HF, HTN, ESRD on HD, pHTN, venous insuff presented to ED from HD w/ high BP's, sweating and mild confusion w/ aphasia. Code stroke activated in ED, however stat MRI was negative for acute infarct and neurology felt pt more likely had PRES type symptoms. Pt rec'd IV and po BP meds , BP's this am better but still high, however all symptoms have resolved. EEG was neg for seizures. Asked to see for dialysis.   Pt is on 3 bp meds at home, hydralazine, norvasc and coreg.  His last HD was Wed off schedule last week, missed 1-2 sessions. Has been taking his BP lowering meds.  No CP or SOB.   Past Medical History  Past Medical History:  Diagnosis Date  . A-V fistula (HCC)    Right arm  . Abdominal hernia   . AKI (acute kidney injury) ([place]) 01/2015  . Anasarca   . Anemia   . Asthma    as a child  . Bilateral inguinal hernia   . Bilateral pleural effusion 01/2017  . Cellulitis 01/16/2017   Bilateral lower extremity  . CHF (congestive heart failure) ([place])   . Chronic venous insufficiency   . Concentric left ventricular hypertrophy 08/17/2017   Moderated, noted on ECHO  . Diastolic dysfunction 58/85/0277   noted on ECHO  . Elevated LFTs    per patient, resolved  . End stage renal disease [facility])    Dialysis T/Th/Sa  Started 02/2017/pt is waiting for a kidney transplant  . H/O right inguinal hernia repair 11/2017  . History of colonoscopy 03/20/2018  . History of elevated PSA 10/2017  . Hypertension   . Pneumonia   . Pulmonary hypertension (HCC)    Moderate  . Venous stasis ulcer ([place])    bil legs/ healed up now per pt (03/06/18)   Past Surgical History  Past Surgical History:  Procedure Laterality Date  . AV FISTULA PLACEMENT Right 01/20/2017    Procedure: ARTERIOVENOUS (AV) FISTULA CREATION;  Surgeon: Angelia Mould, MD;  Location: [place];  Service: Vascular;  Laterality: Right;  . HERNIA REPAIR    . INSERTION OF DIALYSIS CATHETER Right 01/20/2017   Procedure: INSERTION OF DIALYSIS CATHETER;  Surgeon: Angelia Mould, MD;  Location: [employer] [facility];  Service: Vascular;  Laterality: Right;  . INSERTION OF MESH N/A 11/18/2017   Procedure: INSERTION OF MESH;  Surgeon: Michael Boston, MD;  Location: [facility];  Service: General;  Laterality: N/A;  . IR FLUORO GUIDE CV LINE RIGHT  01/18/2017  . IR US GUIDE VASC ACCESS RIGHT  01/18/2017  . LAPAROSCOPIC INGUINAL HERNIA WITH UMBILICAL HERNIA Bilateral 11/18/2017   Procedure: LAPAROSCOPIC BILATERAL INGUINAL HERNIA REPAIR WITH UMBILICAL HERNIA REPAIR WITH MESH;  Surgeon: Michael Boston, MD;  Location: [facility];  Service: General;  Laterality: Bilateral;  . TOE SURGERY     right fracture in big toe  . TONSILLECTOMY     Family History  Family History  Problem Relation Age of Onset  . Heart attack Mother 64       CABG hx  . Heart disease Mother   . Hypertension Mother   . Stroke Mother   . Diabetes Father   . Healthy Brother   . Prostate cancer Maternal  Grandfather   . Colon cancer Neg Hx   . Colon polyps Neg Hx   . Esophageal cancer Neg Hx   . Stomach cancer Neg Hx   . Rectal cancer Neg Hx    Social History  reports that he quit smoking about 24 years ago. He quit after 17.00 years of use. He has never used smokeless tobacco. He reports previous alcohol use. He reports previous drug use. Allergies  Allergies  Allergen Reactions  . Lisinopril Cough   Home medications Prior to Admission medications   Medication Sig Start Date End Date Taking? Authorizing Provider  amLODipine (NORVASC) 10 MG tablet Take 10 mg by mouth every evening. 06/20/18  Yes [provider]  carvedilol (COREG) 25 MG tablet Take 1 tablet (25 mg total) by mouth 2 (two) times daily with a meal. 02/02/18  Yes  [name], FNP  hydrALAZINE (APRESOLINE) 50 MG tablet Take 50 mg by mouth 3 (three) times daily. 08/16/19  Yes [provider]  multivitamin (RENA-VIT) TABS tablet Take 1 tablet by mouth daily. 10/20/17  Yes [provider]  polyvinyl alcohol (LIQUIFILM TEARS) 1.4 % ophthalmic solution Place 1 drop into both eyes as needed for dry eyes.   Yes [provider]  hydrALAZINE (APRESOLINE) 25 MG tablet Take 1 tablet (25 mg total) by mouth every 8 (eight) hours. Patient not taking: Reported on 10/31/2019 02/19/19   Domenic Polite, MD     Vitals:   10/31/19 0915 10/31/19 3976 10/31/19 0948 10/31/19 0949  BP: (!) 213/99     Pulse: (!) 57 62 63 63  Resp: 19 (!) 21 20 (!) 28  Temp:      SpO2: 100% 99% 98% 100%  Weight:      Height:       Exam Gen alert, no distress No rash, cyanosis or gangrene Sclera anicteric, throat clear  No jvd or bruits Chest clear bilat to bases RRR no RG, 2/6 sem Abd soft ntnd no mass or ascites +bs GU normal male MS no joint effusions or deformity Ext 1+ LE edema, no wounds or ulcers Neuro is alert, Ox 3 , nf  RUA aVF+bruit    [facility]  4h 36min   450/800   76kg  2K/3.5Ca bath  RUA AVF Hep 6000    - mircera 60 q 2, next on 10/19, last Hb 11  - venofer 50 q wk  - hect 2ug tiw  Assessment/ Plan: 1. HTN urgency - suspect vol overload d/t missed HD x 2 last week. States he is compliant w/ BP meds. Ordered home BP meds. Get vol down w/ HD in am. Use prn IV meds for BP control.  2. AMS / aphasia - MRI neg for CVA, possible PRES/ HTN related, better today.  3. ESRD - on HD x 2 yrs, TTS. Had HD yest. Missed HD x 2 thurs/ Sat last week.  4. Anemia ckd -  Due for esa now, darbe 40 q wk here, cont iv fe  5. MBD ckd - cont vdra w/ hd      Kelly Splinter  MD 10/31/2019, 10:12 AM  Recent Labs  Lab 10/30/19 1714 10/30/19 1719 10/31/19 0441 10/31/19 0442  WBC 3.8*  --  3.9*  --   HGB 10.4*   < > 10.2* 10.9*   < > =  values in this interval not displayed.   Recent Labs  Lab 10/30/19 1714 10/30/19 1714 10/30/19 1719 10/30/19 1719 10/31/19 0230 10/31/19  0442  K 3.9   < > 3.8   < > 5.0 5.0  BUN 50*   < > 51*  --  59*  --   CREATININE 8.65*   < > 9.30*  --  10.27*  --   CALCIUM 10.6*  --   --   --  9.5  --    < > = values in this interval not displayed.

## 2019-10-31 NOTE — Progress Notes (Signed)
PROGRESS NOTE    Jason [name].  HAL:[id number] DOB: 08/24/1960 DOA: 10/30/2019 PCP: [name], FNP     Brief Narrative:  Jason [name]. 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  According to emergency department staff and reports by EMS, patient underwent 3 hours 37 minutes of hemodialysis earlier today.  Throughout the dialysis session, blood pressures were higher than usual reportedly 210/130 by EMS.  By the end of the patient's hemodialysis session the patient was exhibiting lethargy and inability to verbalize answers to questions.  EMS was then called and patient was brought into [facility] emergency department as a code stroke.  Upon arrival, patient underwent prompt evaluation by Dr. Leonel Ramsay with neurology.  Initially there was concern for an acute stroke although patient quickly underwent head imaging including noncontrast CT head, CT angiogram of the head and neck as well as MRI brain which revealed no evidence of acute stroke.  The only abnormality found on MRI was evidence of several chronic microhemorrhages.  Neurology at that point felt that the most likely culprit for the patient's presentation was hypertensive encephalopathy.  They recommended admitting to medicine, completing encephalopathy work-up and titration of antihypertensive therapy.  The hospitalist group was then called to assess the patient for admission to the hospital.  New events last 24 hours / Subjective: Mentation back to baseline. No physical  complaints today.   Assessment & Plan:   Principal Problem:   Hypertensive emergency Active Problems:   Anemia of chronic disease   Essential hypertension   Elevated troponin level not due myocardial infarction   ESRD (end stage renal disease) on dialysis (HCC)   Chronic combined systolic and diastolic congestive heart failure (HCC)   Mediastinal mass   Acute encephalopathy   Hypertensive emergency -Initial systolic blood pressure ranging 220-250 -Initially managed with IV clevidipine, transition to home medications including Norvasc, Coreg, hydralazine as well as IV as needed -Continue blood pressure management, goal reduction 20% daily  Acute metabolic encephalopathy -Work-up negative for stroke -Neurology believes this may be secondary to hypertensive encephalopathy versus dialysis disequilibrium -Resolved and back to baseline  Elevated troponin, secondary to demand ischemia -In setting of hypertensive emergency as well as underlying ESRD  ESRD on dialysis -Consult nephrology today  Chronic combined systolic and diastolic heart failure -Volume management with dialysis  Mediastinal mass as well as left upper lobe nodule -Has been evaluated at [facility], biopsy revealed cyst -Continue follow-up with [facility]    DVT prophylaxis:  SCDs Start: 10/30/19 2326  Code Status: Full Family Communication: None at bedside Disposition Plan:  Status is: Inpatient  Remains inpatient appropriate because:Hemodynamically unstable   Dispo: The patient is from: Home              Anticipated d/c is to: Home              Anticipated d/c date is: 2 days              Patient currently is not medically stable to d/c.  Remains with elevated pressure, dialysis as  planned tomorrow.   Consultants:   Neurology  Nephrology   Antimicrobials:  Anti-infectives (From admission, onward)   None        Objective: Vitals:   10/31/19 1145 10/31/19 1230 10/31/19 1300  10/31/19 1330  BP: (!) 197/101 (!) 202/111 (!) 193/111 (!) 199/100  Pulse: 68 76 81 71  Resp: 17 17 (!) 21 11  Temp:      SpO2: 92% 99%  99%  Weight:      Height:       No intake or output data in the 24 hours ending 10/31/19 1333 Filed Weights   10/30/19 1700 10/30/19 1924  Weight: 78.5 kg 81 kg    Examination:  General exam: Appears calm and comfortable  Respiratory system: Clear to auscultation. Respiratory effort normal. No respiratory distress. No conversational dyspnea.  Cardiovascular system: S1 & S2 heard, RRR. No murmurs. No pedal edema. Gastrointestinal system: Abdomen is nondistended, soft and nontender. Normal bowel sounds heard. Central nervous system: Alert and oriented. No focal neurological deficits. Speech clear.  Extremities: Symmetric in appearance  Skin: No rashes, lesions or ulcers on exposed skin  Psychiatry: Judgement and insight appear normal. Mood & affect appropriate.   Data Reviewed: I have personally reviewed following labs and imaging studies  CBC: Recent Labs  Lab 10/30/19 1714 10/30/19 1719 10/31/19 0441 10/31/19 0442  WBC 3.8*  --  3.9*  --   NEUTROABS 2.8  --  2.5  --   HGB 10.4* 10.5* 10.2* 10.9*  HCT 31.3* 31.0* 31.4* 32.0*  MCV 89.9  --  93.7  --   PLT 186  --  181  --    Basic Metabolic Panel: Recent Labs  Lab 10/30/19 1714 10/30/19 1719 10/31/19 0230 10/31/19 0442  NA 138 136 134* 136  K 3.9 3.8 5.0 5.0  CL 95* 97* 94*  --   CO2 26  --  27  --   GLUCOSE 88 86 91  --   BUN 50* 51* 59*  --   CREATININE 8.65* 9.30* 10.27*  --   CALCIUM 10.6*  --  9.5  --    GFR: Estimated Creatinine Clearance: 9 mL/min (A) (by C-G formula based on SCr of 10.27 mg/dL (H)). Liver Function Tests: Recent Labs  Lab 10/30/19 1714 10/31/19 0230  AST 16 18  ALT 17 15  ALKPHOS 32* 30*  BILITOT 1.5* 1.0  PROT 7.5 6.8  ALBUMIN 4.2 3.7   No results for input(s): LIPASE, AMYLASE in the last 168 hours. Recent Labs  Lab 10/30/19 2030    AMMONIA 37*   Coagulation Profile: Recent Labs  Lab 10/30/19 1714  INR 1.2   Cardiac Enzymes: No results for input(s): CKTOTAL, CKMB, CKMBINDEX, TROPONINI in the last 168 hours. BNP (last 3 results) No results for input(s): PROBNP in the last 8760 hours. HbA1C: No results for input(s): HGBA1C in the last 72 hours. CBG: No results for input(s): GLUCAP in the last 168 hours. Lipid Profile: No results for input(s): CHOL, HDL, LDLCALC, TRIG, CHOLHDL, LDLDIRECT in the last 72 hours. Thyroid Function Tests: Recent Labs    10/30/19 1951  TSH 1.826   Anemia Panel: Recent Labs    10/31/19 0441  VITAMINB12 410  FOLATE 33.5   Sepsis Labs: No results for input(s): PROCALCITON, LATICACIDVEN in the last 168 hours.  Recent Results (from the past 240 hour(s))  Resp Panel by RT PCR (RSV, Flu A&B, Covid) - Nasopharyngeal Swab     Status: None  Collection Time: 10/30/19  7:42 PM   Specimen: Nasopharyngeal Swab  Result Value Ref Range Status   SARS Coronavirus 2 by RT PCR NEGATIVE NEGATIVE Final    Comment: (NOTE) SARS-CoV-2 target nucleic acids are NOT DETECTED.  The SARS-CoV-2 RNA is generally detectable in upper respiratoy specimens during the acute phase of infection. The lowest concentration of SARS-CoV-2 viral copies this assay can detect is 131 copies/mL. A negative result does not preclude SARS-Cov-2 infection and should not be used as the sole basis for treatment or other patient management decisions. A negative result may occur with  improper specimen collection/handling, submission of specimen other than nasopharyngeal swab, presence of viral mutation(s) within the areas targeted by this assay, and inadequate number of viral copies (<131 copies/mL). A negative result must be combined with clinical observations, patient history, and epidemiological information. The expected result is Negative.  Fact Sheet for Patients:   PinkCheek.be  Fact Sheet for Healthcare Providers:  GravelBags.it  This test is no t yet approved or cleared by the [place] FDA and  has been authorized for detection and/or diagnosis of SARS-CoV-2 by FDA under an Emergency Use Authorization (EUA). This EUA will remain  in effect (meaning this test can be used) for the duration of the COVID-19 declaration under Section 564(b)(1) of the Act, 21 U.S.C. section 360bbb-3(b)(1), unless the authorization is terminated or revoked sooner.     Influenza A by PCR NEGATIVE NEGATIVE Final   Influenza B by PCR NEGATIVE NEGATIVE Final    Comment: (NOTE) The Xpert Xpress SARS-CoV-2/FLU/RSV assay is intended as an aid in  the diagnosis of influenza from Nasopharyngeal swab specimens and  should not be used as a sole basis for treatment. Nasal washings and  aspirates are unacceptable for Xpert Xpress SARS-CoV-2/FLU/RSV  testing.  Fact Sheet for Patients: PinkCheek.be  Fact Sheet for Healthcare Providers: GravelBags.it  This test is not yet approved or cleared by the [place] FDA and  has been authorized for detection and/or diagnosis of SARS-CoV-2 by  FDA under an Emergency Use Authorization (EUA). This EUA will remain  in effect (meaning this test can be used) for the duration of the  Covid-19 declaration under Section 564(b)(1) of the Act, 21  U.S.C. section 360bbb-3(b)(1), unless the authorization is  terminated or revoked.    Respiratory Syncytial Virus by PCR NEGATIVE NEGATIVE Final    Comment: (NOTE) Fact Sheet for Patients: PinkCheek.be  Fact Sheet for Healthcare Providers: GravelBags.it  This test is not yet approved or cleared by the [place] FDA and  has been authorized for detection and/or diagnosis of SARS-CoV-2 by  FDA under an Emergency  Use Authorization (EUA). This EUA will remain  in effect (meaning this test can be used) for the duration of the  COVID-19 declaration under Section 564(b)(1) of the Act, 21 U.S.C.  section 360bbb-3(b)(1), unless the authorization is terminated or  revoked. Performed at [facility], [place] [address], [place], [place] [address]       Radiology Studies: CT Code Stroke CTA Head W/WO contrast  Addendum Date: 10/30/2019   ADDENDUM REPORT: 10/30/2019 21:59 ADDENDUM: In addition to the initially described findings, note is made of a 2.5 x 1.6 cm irregular nodule at the medial left upper lobe. This is described in the initial finding section of the report, but omitted from the impression by mistake. Further evaluation with dedicated cross-sectional imaging of the chest recommended for complete characterization. Underlying emphysema noted as well. Emphysema (ICD10-J43.9). Addended  findings were communicated by telephone at the time of interpretation on 10/30/2019 at 9:56 pm to Dr. Curly Shores, who verbally acknowledged these results. Electronically Signed   By: Jeannine Boga M.D.   On: 10/30/2019 21:59   Result Date: 10/30/2019 CLINICAL DATA:  Initial evaluation for acute aphasia. EXAM: CT ANGIOGRAPHY HEAD AND NECK CT PERFUSION BRAIN TECHNIQUE: Multidetector CT imaging of the head and neck was performed using the standard protocol during bolus administration of intravenous contrast. Multiplanar CT image reconstructions and MIPs were obtained to evaluate the vascular anatomy. Carotid stenosis measurements (when applicable) are obtained utilizing NASCET criteria, using the distal internal carotid diameter as the denominator. Multiphase CT imaging of the brain was performed following IV bolus contrast injection. Subsequent parametric perfusion maps were calculated using RAPID software. CONTRAST:  172mL OMNIPAQUE IOHEXOL 350 MG/ML SOLN COMPARISON:  Prior head CT from earlier the same day. FINDINGS: CTA  NECK FINDINGS Aortic arch: Visualized aortic arch of normal caliber with normal branch pattern. Mild atheromatous change seen about the arch and origin of the great vessels without hemodynamically significant stenosis or other acute finding. Right carotid system: Right common and internal carotid arteries patent without stenosis, dissection, or occlusion. Mild atheromatous plaque about the right bifurcation without significant stenosis. Left carotid system: Left common and internal carotid arteries patent without stenosis, dissection or occlusion. Mild atheromatous change about the left bifurcation/proximal left ICA without hemodynamically significant stenosis. Vertebral arteries: Both vertebral arteries arise from the subclavian arteries. No significant proximal subclavian artery stenosis. Left vertebral artery dominant with a diffusely hypoplastic right vertebral artery. Atheromatous plaque at the origin of the left vertebral artery with approximate 50% ostial stenosis. Vertebral arteries otherwise widely patent within the neck without stenosis, dissection or occlusion. Skeleton: No visible acute osseous abnormality. No discrete lytic or blastic osseous lesions. Moderate cervical spondylosis noted at C6-7. Other neck: No other acute soft tissue abnormality within the neck. No mass lesion or adenopathy. Upper chest: Emphysema. Irregular nodular density measuring approximately 2.5 x 1.6 cm at the medial left upper lobe (series 20, image 178), indeterminate. Review of the MIP images confirms the above findings CTA HEAD FINDINGS Anterior circulation: Both internal carotid arteries patent to the termini without significant stenosis or other abnormality. Left A1 widely patent. Right A1 hypoplastic and/or absent, accounting for the diminutive right ICA is compared to the left. Normal anterior communicating artery. Both anterior cerebral arteries patent to their distal aspects without stenosis. No M1 stenosis or  occlusion. Normal MCA bifurcations. No proximal M2 branch occlusion. Distal MCA branches well perfused and symmetric. Posterior circulation: Dominant left vertebral artery widely patent to the vertebrobasilar junction. Patent left PICA. Hypoplastic right vertebral artery terminates in PICA. Right PICA patent as well. Basilar widely patent to its distal aspect without stenosis. Superior cerebral arteries patent bilaterally. Both PCAs well perfused to their distal aspects without stenosis. Small bilateral posterior communicating arteries noted. Venous sinuses: Grossly patent allowing for timing the contrast bolus. Anatomic variants: Hypoplastic/absent right A1 segment. Hypoplastic right vertebral artery terminates in PICA. No aneurysm. Review of the MIP images confirms the above findings CT Brain Perfusion Findings: ASPECTS: 10. CBF (<30%) Volume: 48mL Perfusion (Tmax>6.0s) volume: 35mL Mismatch Volume: 2mL Infarction Location:Negative CT perfusion with no evidence for acute core infarct or other perfusion abnormality. IMPRESSION: CTA HEAD AND NECK IMPRESSION: 1. Negative CTA of the head and neck for emergent large vessel occlusion. 2. Mild atheromatous change about the aortic arch and carotid bifurcations without hemodynamically significant stenosis. 3. 50% atheromatous  stenosis at the origin of the left vertebral artery. Left vertebral artery dominant. Right vertebral artery diffusely hypoplastic and terminates in PICA. CT PERFUSION IMPRESSION: Negative CT perfusion. No evidence for core infarct or other perfusion abnormality. Critical Value/emergent results were called by telephone at the time of interpretation on 10/30/2019 at 6:35 pm to provider MCNEILL [facility] , who verbally acknowledged these results. Electronically Signed: By: Jeannine Boga M.D. On: 10/30/2019 19:04   DG Chest 1 View  Result Date: 10/30/2019 CLINICAL DATA:  Elevated blood pressure. EXAM: CHEST  1 VIEW COMPARISON:  April 26, 2019  FINDINGS: The study is limited secondary to patient rotation. Mild perihilar pulmonary vascular prominence is noted. There is no evidence of acute infiltrate, pleural effusion or pneumothorax. There is moderate severity enlargement of the cardiac silhouette. The visualized skeletal structures are unremarkable. IMPRESSION: Cardiomegaly with mild pulmonary vascular congestion. Electronically Signed   By: Virgina Norfolk M.D.   On: 10/30/2019 23:14   CT Code Stroke CTA Neck W/WO contrast  Addendum Date: 10/30/2019   ADDENDUM REPORT: 10/30/2019 21:59 ADDENDUM: In addition to the initially described findings, note is made of a 2.5 x 1.6 cm irregular nodule at the medial left upper lobe. This is described in the initial finding section of the report, but omitted from the impression by mistake. Further evaluation with dedicated cross-sectional imaging of the chest recommended for complete characterization. Underlying emphysema noted as well. Emphysema (ICD10-J43.9). Addended findings were communicated by telephone at the time of interpretation on 10/30/2019 at 9:56 pm to Dr. Curly Shores, who verbally acknowledged these results. Electronically Signed   By: Jeannine Boga M.D.   On: 10/30/2019 21:59   Result Date: 10/30/2019 CLINICAL DATA:  Initial evaluation for acute aphasia. EXAM: CT ANGIOGRAPHY HEAD AND NECK CT PERFUSION BRAIN TECHNIQUE: Multidetector CT imaging of the head and neck was performed using the standard protocol during bolus administration of intravenous contrast. Multiplanar CT image reconstructions and MIPs were obtained to evaluate the vascular anatomy. Carotid stenosis measurements (when applicable) are obtained utilizing NASCET criteria, using the distal internal carotid diameter as the denominator. Multiphase CT imaging of the brain was performed following IV bolus contrast injection. Subsequent parametric perfusion maps were calculated using RAPID software. CONTRAST:  12mL OMNIPAQUE IOHEXOL  350 MG/ML SOLN COMPARISON:  Prior head CT from earlier the same day. FINDINGS: CTA NECK FINDINGS Aortic arch: Visualized aortic arch of normal caliber with normal branch pattern. Mild atheromatous change seen about the arch and origin of the great vessels without hemodynamically significant stenosis or other acute finding. Right carotid system: Right common and internal carotid arteries patent without stenosis, dissection, or occlusion. Mild atheromatous plaque about the right bifurcation without significant stenosis. Left carotid system: Left common and internal carotid arteries patent without stenosis, dissection or occlusion. Mild atheromatous change about the left bifurcation/proximal left ICA without hemodynamically significant stenosis. Vertebral arteries: Both vertebral arteries arise from the subclavian arteries. No significant proximal subclavian artery stenosis. Left vertebral artery dominant with a diffusely hypoplastic right vertebral artery. Atheromatous plaque at the origin of the left vertebral artery with approximate 50% ostial stenosis. Vertebral arteries otherwise widely patent within the neck without stenosis, dissection or occlusion. Skeleton: No visible acute osseous abnormality. No discrete lytic or blastic osseous lesions. Moderate cervical spondylosis noted at C6-7. Other neck: No other acute soft tissue abnormality within the neck. No mass lesion or adenopathy. Upper chest: Emphysema. Irregular nodular density measuring approximately 2.5 x 1.6 cm at the medial left upper lobe (series 20,  image 178), indeterminate. Review of the MIP images confirms the above findings CTA HEAD FINDINGS Anterior circulation: Both internal carotid arteries patent to the termini without significant stenosis or other abnormality. Left A1 widely patent. Right A1 hypoplastic and/or absent, accounting for the diminutive right ICA is compared to the left. Normal anterior communicating artery. Both anterior cerebral  arteries patent to their distal aspects without stenosis. No M1 stenosis or occlusion. Normal MCA bifurcations. No proximal M2 branch occlusion. Distal MCA branches well perfused and symmetric. Posterior circulation: Dominant left vertebral artery widely patent to the vertebrobasilar junction. Patent left PICA. Hypoplastic right vertebral artery terminates in PICA. Right PICA patent as well. Basilar widely patent to its distal aspect without stenosis. Superior cerebral arteries patent bilaterally. Both PCAs well perfused to their distal aspects without stenosis. Small bilateral posterior communicating arteries noted. Venous sinuses: Grossly patent allowing for timing the contrast bolus. Anatomic variants: Hypoplastic/absent right A1 segment. Hypoplastic right vertebral artery terminates in PICA. No aneurysm. Review of the MIP images confirms the above findings CT Brain Perfusion Findings: ASPECTS: 10. CBF (<30%) Volume: 5mL Perfusion (Tmax>6.0s) volume: 2mL Mismatch Volume: 53mL Infarction Location:Negative CT perfusion with no evidence for acute core infarct or other perfusion abnormality. IMPRESSION: CTA HEAD AND NECK IMPRESSION: 1. Negative CTA of the head and neck for emergent large vessel occlusion. 2. Mild atheromatous change about the aortic arch and carotid bifurcations without hemodynamically significant stenosis. 3. 50% atheromatous stenosis at the origin of the left vertebral artery. Left vertebral artery dominant. Right vertebral artery diffusely hypoplastic and terminates in PICA. CT PERFUSION IMPRESSION: Negative CT perfusion. No evidence for core infarct or other perfusion abnormality. Critical Value/emergent results were called by telephone at the time of interpretation on 10/30/2019 at 6:35 pm to provider MCNEILL [facility] , who verbally acknowledged these results. Electronically Signed: By: Jeannine Boga M.D. On: 10/30/2019 19:04   MR BRAIN WO CONTRAST  Result Date: 10/30/2019 CLINICAL  DATA:  59 year old male code stroke presentation today. Aphasia. EXAM: MRI HEAD WITHOUT CONTRAST TECHNIQUE: Multiplanar, multiecho pulse sequences of the brain and surrounding structures were obtained without intravenous contrast. COMPARISON:  CT head, CTA and CTP today. FINDINGS: The examination had to be discontinued prior to completion due to patient inability to continue. Axial DWI and FLAIR only obtained. No restricted diffusion to suggest acute infarction. No intracranial mass effect. No ventriculomegaly. FLAIR images demonstrate patchy mild to moderate for age nonspecific bilateral cerebral white matter hyperintensity. No cortical encephalomalacia identified. FLAIR signal elsewhere within normal limits. However, trace DWI images suggest chronic microhemorrhage in the dorsal left thalamus (susceptibility on series 2, image 28), and perhaps also in the right cerebellum. IMPRESSION: 1. Truncated exam, negative for acute infarct. 2. Mild to moderate for age nonspecific white matter signal changes. Several chronic microhemorrhages suspected. Electronically Signed   By: Genevie Ann M.D.   On: 10/30/2019 19:15   CT CEREBRAL PERFUSION W CONTRAST  Addendum Date: 10/30/2019   ADDENDUM REPORT: 10/30/2019 21:59 ADDENDUM: In addition to the initially described findings, note is made of a 2.5 x 1.6 cm irregular nodule at the medial left upper lobe. This is described in the initial finding section of the report, but omitted from the impression by mistake. Further evaluation with dedicated cross-sectional imaging of the chest recommended for complete characterization. Underlying emphysema noted as well. Emphysema (ICD10-J43.9). Addended findings were communicated by telephone at the time of interpretation on 10/30/2019 at 9:56 pm to Dr. Curly Shores, who verbally acknowledged these results. Electronically Signed  By: Jeannine Boga M.D.   On: 10/30/2019 21:59   Result Date: 10/30/2019 CLINICAL DATA:  Initial evaluation  for acute aphasia. EXAM: CT ANGIOGRAPHY HEAD AND NECK CT PERFUSION BRAIN TECHNIQUE: Multidetector CT imaging of the head and neck was performed using the standard protocol during bolus administration of intravenous contrast. Multiplanar CT image reconstructions and MIPs were obtained to evaluate the vascular anatomy. Carotid stenosis measurements (when applicable) are obtained utilizing NASCET criteria, using the distal internal carotid diameter as the denominator. Multiphase CT imaging of the brain was performed following IV bolus contrast injection. Subsequent parametric perfusion maps were calculated using RAPID software. CONTRAST:  181mL OMNIPAQUE IOHEXOL 350 MG/ML SOLN COMPARISON:  Prior head CT from earlier the same day. FINDINGS: CTA NECK FINDINGS Aortic arch: Visualized aortic arch of normal caliber with normal branch pattern. Mild atheromatous change seen about the arch and origin of the great vessels without hemodynamically significant stenosis or other acute finding. Right carotid system: Right common and internal carotid arteries patent without stenosis, dissection, or occlusion. Mild atheromatous plaque about the right bifurcation without significant stenosis. Left carotid system: Left common and internal carotid arteries patent without stenosis, dissection or occlusion. Mild atheromatous change about the left bifurcation/proximal left ICA without hemodynamically significant stenosis. Vertebral arteries: Both vertebral arteries arise from the subclavian arteries. No significant proximal subclavian artery stenosis. Left vertebral artery dominant with a diffusely hypoplastic right vertebral artery. Atheromatous plaque at the origin of the left vertebral artery with approximate 50% ostial stenosis. Vertebral arteries otherwise widely patent within the neck without stenosis, dissection or occlusion. Skeleton: No visible acute osseous abnormality. No discrete lytic or blastic osseous lesions. Moderate  cervical spondylosis noted at C6-7. Other neck: No other acute soft tissue abnormality within the neck. No mass lesion or adenopathy. Upper chest: Emphysema. Irregular nodular density measuring approximately 2.5 x 1.6 cm at the medial left upper lobe (series 20, image 178), indeterminate. Review of the MIP images confirms the above findings CTA HEAD FINDINGS Anterior circulation: Both internal carotid arteries patent to the termini without significant stenosis or other abnormality. Left A1 widely patent. Right A1 hypoplastic and/or absent, accounting for the diminutive right ICA is compared to the left. Normal anterior communicating artery. Both anterior cerebral arteries patent to their distal aspects without stenosis. No M1 stenosis or occlusion. Normal MCA bifurcations. No proximal M2 branch occlusion. Distal MCA branches well perfused and symmetric. Posterior circulation: Dominant left vertebral artery widely patent to the vertebrobasilar junction. Patent left PICA. Hypoplastic right vertebral artery terminates in PICA. Right PICA patent as well. Basilar widely patent to its distal aspect without stenosis. Superior cerebral arteries patent bilaterally. Both PCAs well perfused to their distal aspects without stenosis. Small bilateral posterior communicating arteries noted. Venous sinuses: Grossly patent allowing for timing the contrast bolus. Anatomic variants: Hypoplastic/absent right A1 segment. Hypoplastic right vertebral artery terminates in PICA. No aneurysm. Review of the MIP images confirms the above findings CT Brain Perfusion Findings: ASPECTS: 10. CBF (<30%) Volume: 60mL Perfusion (Tmax>6.0s) volume: 61mL Mismatch Volume: 5mL Infarction Location:Negative CT perfusion with no evidence for acute core infarct or other perfusion abnormality. IMPRESSION: CTA HEAD AND NECK IMPRESSION: 1. Negative CTA of the head and neck for emergent large vessel occlusion. 2. Mild atheromatous change about the aortic arch and  carotid bifurcations without hemodynamically significant stenosis. 3. 50% atheromatous stenosis at the origin of the left vertebral artery. Left vertebral artery dominant. Right vertebral artery diffusely hypoplastic and terminates in PICA. CT PERFUSION IMPRESSION: Negative  CT perfusion. No evidence for core infarct or other perfusion abnormality. Critical Value/emergent results were called by telephone at the time of interpretation on 10/30/2019 at 6:35 pm to provider [facility] , who verbally acknowledged these results. Electronically Signed: By: Jeannine Boga M.D. On: 10/30/2019 19:04   EEG adult  Result Date: 10/30/2019 Greta Doom, MD     10/30/2019  9:10 PM History: 59 year old male presenting with aphasia Sedation: 2 mg Ativan given few hours prior to EEG Technique: This is a 21 channel routine scalp EEG performed at the bedside with bipolar and monopolar montages arranged in accordance to the international 10/20 system of electrode placement. One channel was dedicated to EKG recording. Background: There is a posterior dominant rhythm of 7 to 8 Hz which is seen bilaterally.  In addition, there is admixed generalized irregular delta and theta range activities.  Clear sleep structures are not recorded. Photic stimulation: Physiologic driving is now performed EEG Abnormalities: 1) generalized irregular slow activity 2) slow posterior dominant rhythm Clinical Interpretation: This EEG is consistent with a generalized nonspecific cerebral dysfunction (encephalopathy). There was no seizure or seizure predisposition recorded on this study. Please note that lack of epileptiform activity on EEG does not preclude the possibility of epilepsy. Roland Rack, MD [facility] [phone] If 7pm- 7am, please page neurology on call as listed in [employer].   CT HEAD CODE STROKE WO CONTRAST  Result Date: 10/30/2019 CLINICAL DATA:  Code stroke.  Aphasia EXAM: CT HEAD WITHOUT  CONTRAST TECHNIQUE: Contiguous axial images were obtained from the base of the skull through the vertex without intravenous contrast. COMPARISON:  02/17/2019 FINDINGS: Brain: Normal appearance without evidence of atrophy, old or acute infarction, mass lesion, hemorrhage, hydrocephalus or extra-axial collection. Vascular: There is atherosclerotic calcification of the major vessels at the base of the brain. Skull: Negative Sinuses/Orbits: Clear/normal Other: None ASPECTS ([place] Stroke Program Early CT Score) - Ganglionic level infarction (caudate, lentiform nuclei, internal capsule, insula, M1-M3 cortex): 7 - Supraganglionic infarction (M4-M6 cortex): 3 Total score (0-10 with 10 being normal): 10 IMPRESSION: 1. Normal head CT for age. Atherosclerotic calcification of the major vessels at the base of the brain. 2. ASPECTS is 10. 3. These results were communicated to Dr. Leonel Ramsay at 5:31 pmon 10/19/2021by text page via the [facility] messaging system. Electronically Signed   By: Nelson Chimes M.D.   On: 10/30/2019 17:32      Scheduled Meds:  amLODipine  10 mg Oral Daily   carvedilol  25 mg Oral BID WC   Chlorhexidine Gluconate Cloth  6 each Topical Q0600   darbepoetin (ARANESP) injection - DIALYSIS  40 mcg Intravenous Q Wed-HD   doxercalciferol  2 mcg Intravenous Q M,W,F-HD   Continuous Infusions:  ferric gluconate (FERRLECIT/NULECIT) IV 62.5 mg (10/31/19 1303)     LOS: 0 days      Time spent: 40 minutes   Dessa Phi, DO [facility] 10/31/2019, 1:33 PM   Available via Epic secure chat 7am-7pm After these hours, please refer to coverage provider listed on [email or web address]

## 2019-11-01 ENCOUNTER — Inpatient Hospital Stay (HOSPITAL_COMMUNITY): Payer: Medicare Other

## 2019-11-01 LAB — CBC
HCT: 28.6 % — ABNORMAL LOW (ref 39.0–52.0)
Hemoglobin: 10.1 g/dL — ABNORMAL LOW (ref 13.0–17.0)
MCH: 31.8 pg (ref 26.0–34.0)
MCHC: 35.3 g/dL (ref 30.0–36.0)
MCV: 89.9 fL (ref 80.0–100.0)
Platelets: 196 10*3/uL (ref 150–400)
RBC: 3.18 MIL/uL — ABNORMAL LOW (ref 4.22–5.81)
RDW: 12.6 % (ref 11.5–15.5)
WBC: 5.4 10*3/uL (ref 4.0–10.5)
nRBC: 0 % (ref 0.0–0.2)

## 2019-11-01 LAB — BASIC METABOLIC PANEL
Anion gap: 17 — ABNORMAL HIGH (ref 5–15)
BUN: 75 mg/dL — ABNORMAL HIGH (ref 6–20)
CO2: 23 mmol/L (ref 22–32)
Calcium: 9 mg/dL (ref 8.9–10.3)
Chloride: 95 mmol/L — ABNORMAL LOW (ref 98–111)
Creatinine, Ser: 12.69 mg/dL — ABNORMAL HIGH (ref 0.61–1.24)
GFR, Estimated: 4 mL/min — ABNORMAL LOW (ref >60)
Glucose, Bld: 87 mg/dL (ref 70–99)
Potassium: 5.4 mmol/L — ABNORMAL HIGH (ref 3.5–5.1)
Sodium: 135 mmol/L (ref 135–145)

## 2019-11-01 LAB — HEPATITIS B SURFACE ANTIGEN: Hepatitis B Surface Ag: NONREACTIVE

## 2019-11-01 LAB — MRSA PCR SCREENING: MRSA by PCR: NEGATIVE

## 2019-11-01 LAB — HEPATITIS B CORE ANTIBODY, TOTAL: Hep B Core Total Ab: NONREACTIVE

## 2019-11-01 IMAGING — CT CT NECK W/O CM
3 of 4 series · 13 of 33 positions shown, 16 images · non-contrast
Comparison: CT angio head and neck [DATE]

CLINICAL DATA: Cervical lymphadenopathy.

EXAM:
CT NECK WITHOUT CONTRAST
TECHNIQUE: Multidetector CT imaging of the neck was performed following the
standard protocol without intravenous contrast.

[Series 3: neck 1.0 br40 3 · axial · 0.45mm/px · z∈[-297,-85]mm · 5 of 417 slices shown, 7 images]
[im 76/417  soft-tissue]
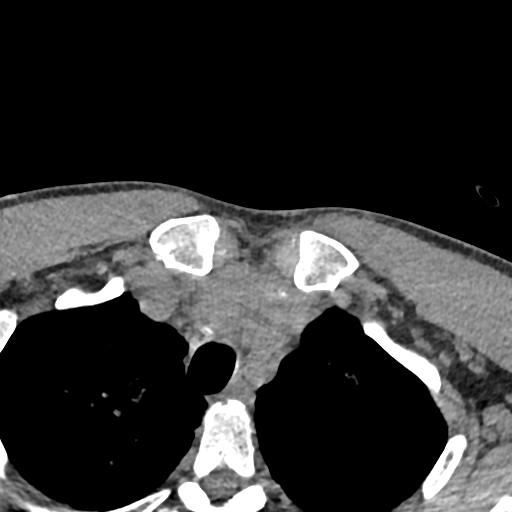
[im 76/417  bone]
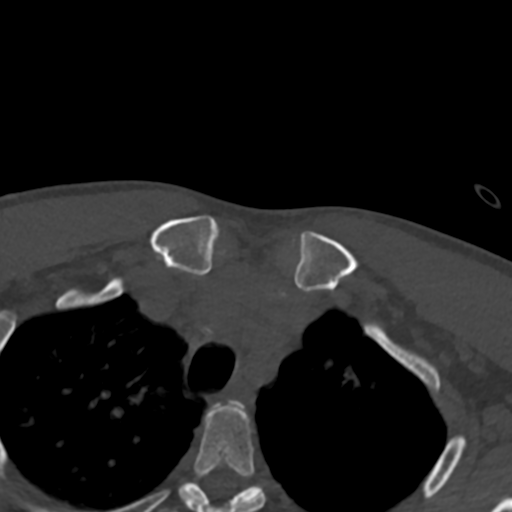
[im 152/417  bone]
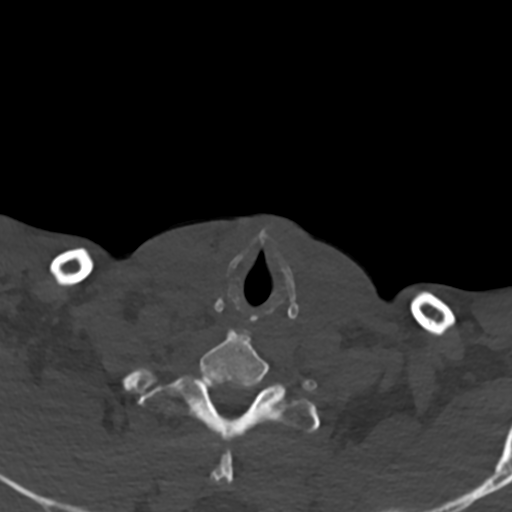
[im 227/417  bone]
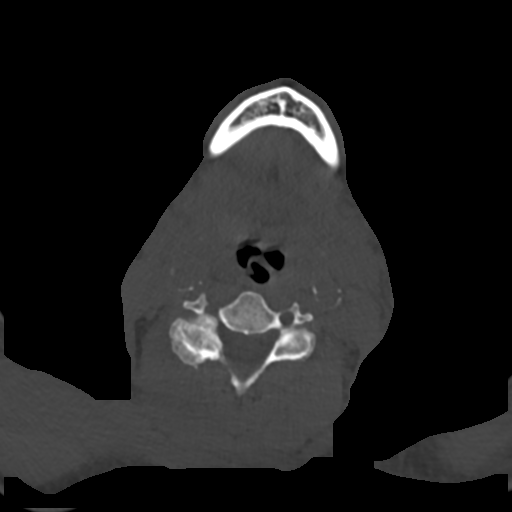
[im 303/417  bone]
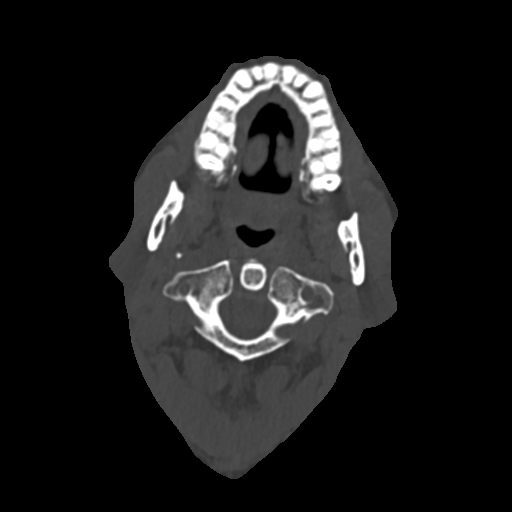
[im 379/417  soft-tissue]
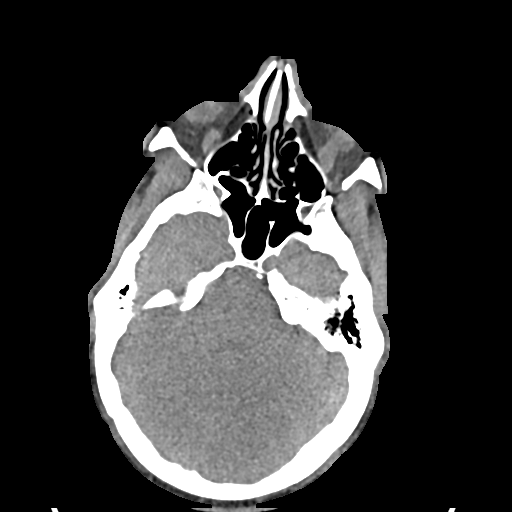
[im 379/417  bone]
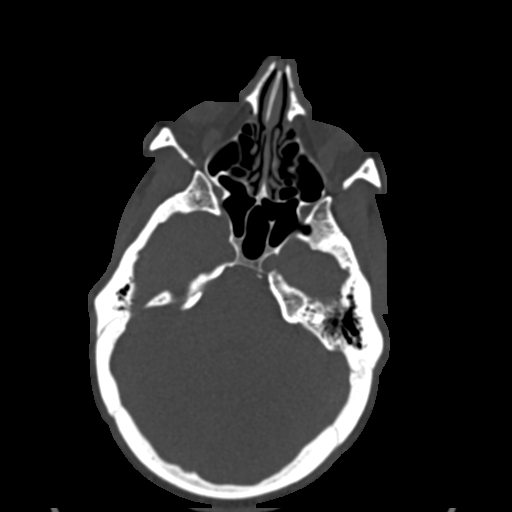

[Series 4: cor 2x2 · coronal · 0.38mm/px · 3 of 122 slices shown (1 of 2)]
[im 25/122  bone]
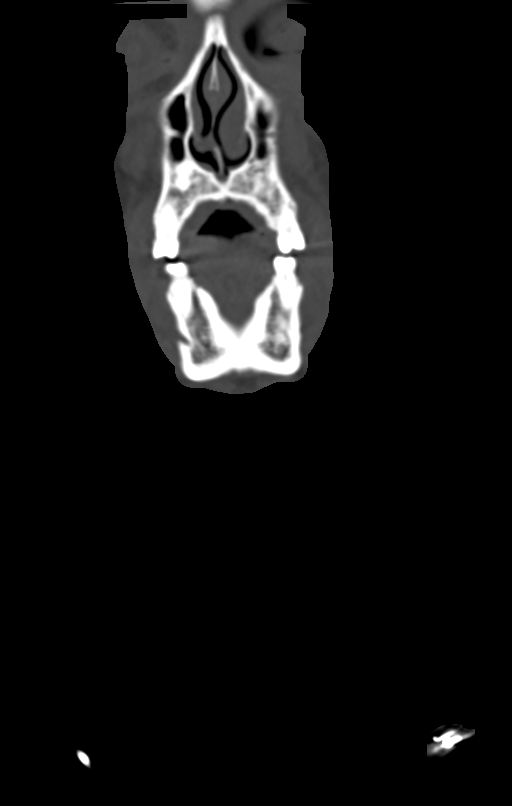
[im 49/122  bone]
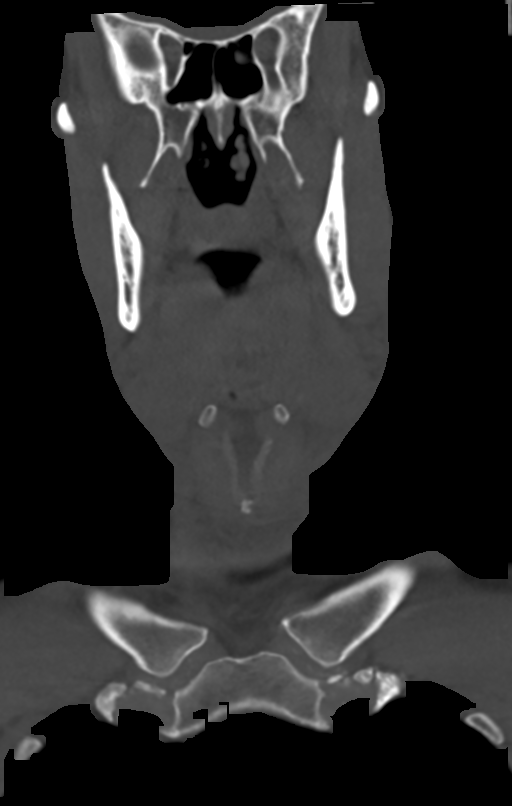
[im 73/122  bone]
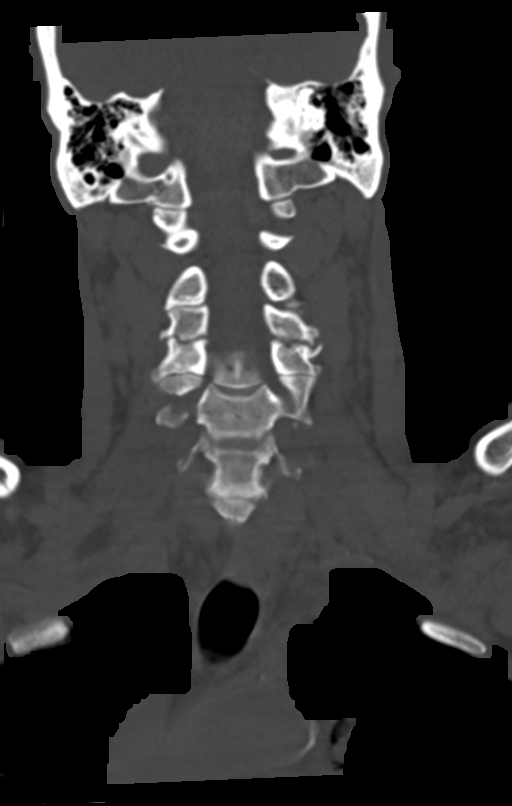

[Series 5: cor 2x2 · sagittal · 0.44mm/px · 5 of 95 slices shown, 6 images (2 of 2)]
[im 32/95  bone]
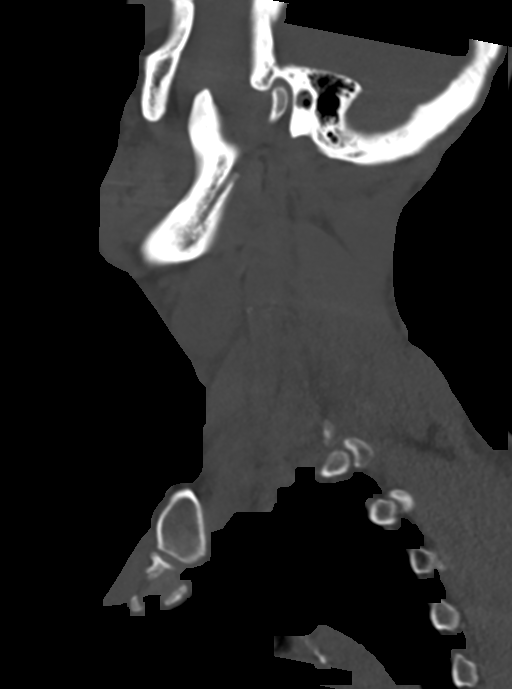
[im 40/95  bone]
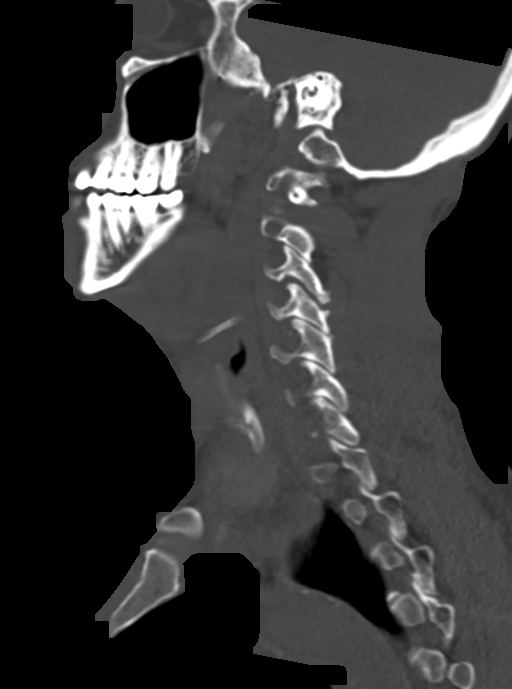
[im 48/95  soft-tissue]
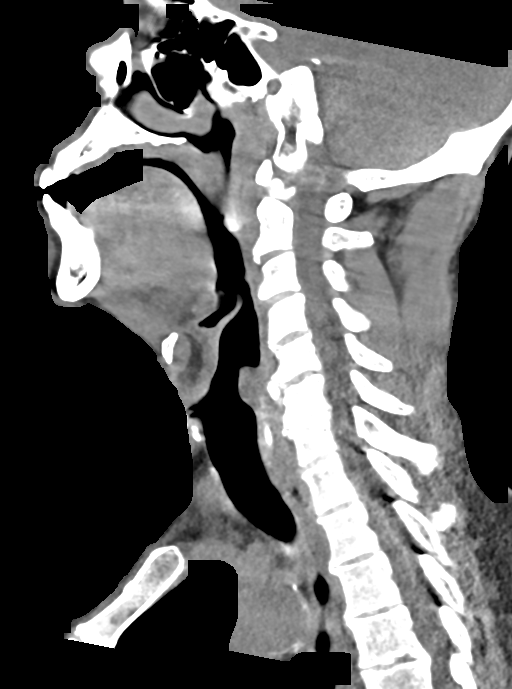
[im 48/95  bone]
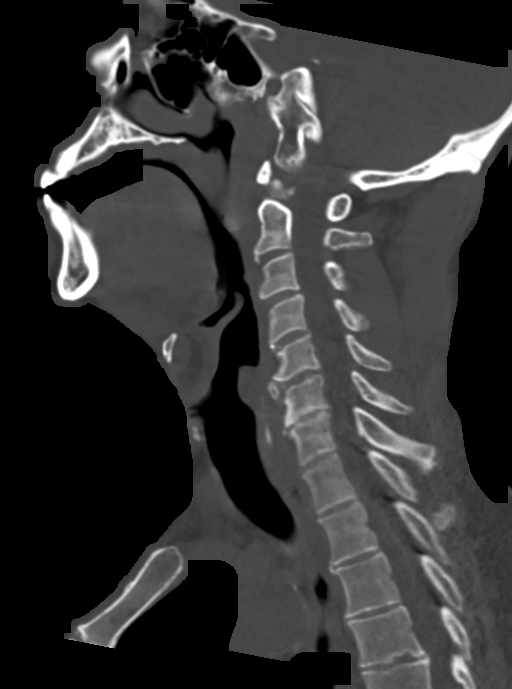
[im 55/95  bone]
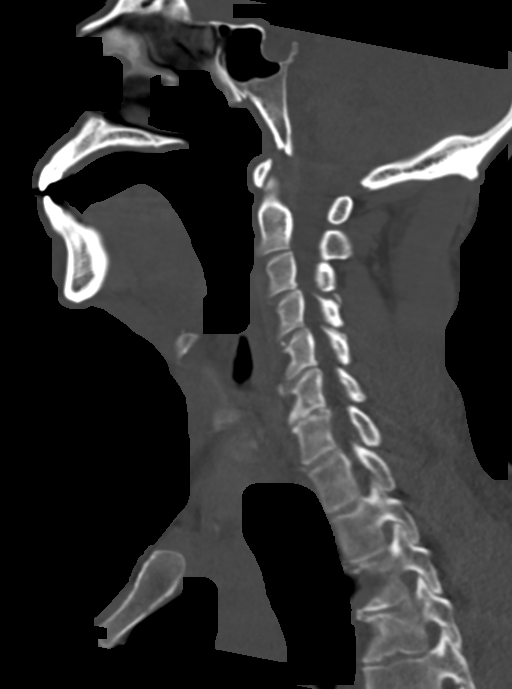
[im 63/95  bone]
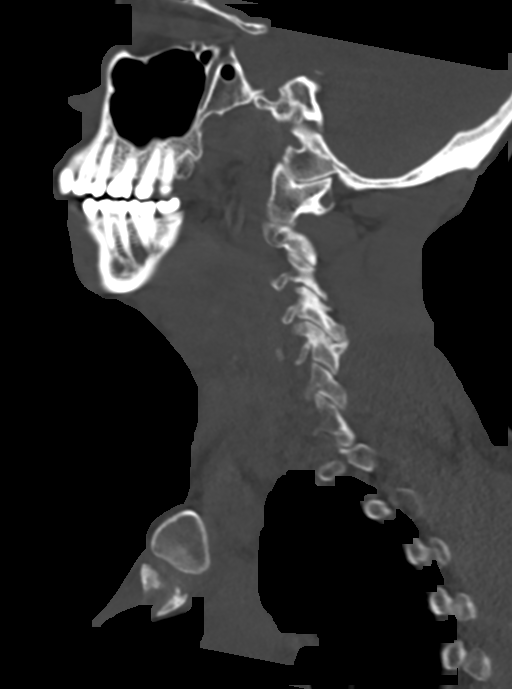

[13 of 33 positions shown; findings below may reference images not displayed]

FINDINGS: Pharynx and larynx: Normal. No mass or swelling.

Salivary glands: No inflammation, mass, or stone.

Thyroid: Negative

Lymph nodes: Evaluation of lymph nodes limited by lack of
intravenous contrast and lack of body fat. Review of the prior CTA
reveals no enlarged lymph nodes in the neck.

Vascular: Limited evaluation without intravenous contrast.

Limited intracranial: Negative

Visualized orbits: Negative

Mastoids and visualized paranasal sinuses: Paranasal sinuses clear
bilaterally. Mastoid clear bilaterally.

Skeleton: Mild degenerative changes in the cervical spine. No acute
abnormality.

Upper chest: Bullous emphysema left upper lobe. 20 x 17 mm left
upper lobe nodule unchanged from the recent study. This is low
density on the prior study and could represent bronchial atresia
with mucocele. Neoplasm not excluded.

Other: None
IMPRESSION: Negative for mass or adenopathy in the neck. Evaluation for lymph
nodes is difficult without intravenous contrast however review of
the recent CTA demonstrates no adenopathy.

Left upper lobe nodule and adjacent bullous emphysema. Possible
bronchial atresia with mucocele. Recommend dedicated CT chest for
further evaluation

## 2019-11-01 MED ORDER — LABETALOL HCL 5 MG/ML IV SOLN: 20.0000 mg | Freq: Once | INTRAVENOUS | Status: DC

## 2019-11-01 MED ORDER — LABETALOL HCL 5 MG/ML IV SOLN
20.0000 mg | Freq: Once | INTRAVENOUS | Status: AC
  Administered 2019-11-01: 20 mg via INTRAVENOUS
  Filled 2019-11-01: qty 4

## 2019-11-01 MED ORDER — HEPARIN SODIUM (PORCINE) 1000 UNIT/ML IJ SOLN
INTRAMUSCULAR | Status: AC
  Filled 2019-11-01: qty 6

## 2019-11-01 NOTE — Progress Notes (Signed)
Ray from HD called and reported that 4 liters were removed, last vitals were BP:195/95 HR: 70 RR:22 Temp: 98.5 O2: 92% RA and no meds were given during HD.

## 2019-11-01 NOTE — Progress Notes (Signed)
PROGRESS NOTE    Jason [name].  KXF:[id number] DOB: 20-Mar-1960 DOA: 10/30/2019 PCP: [name], FNP     Brief Narrative:  Jason [name]. 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  According to emergency department staff and reports by EMS, patient underwent 3 hours 37 minutes of hemodialysis earlier today.  Throughout the dialysis session, blood pressures were higher than usual reportedly 210/130 by EMS.  By the end of the patient's hemodialysis session the patient was exhibiting lethargy and inability to verbalize answers to questions.  EMS was then called and patient was brought into [facility] emergency department as a code stroke.  Upon arrival, patient underwent prompt evaluation by Dr. Leonel Ramsay with neurology.  Initially there was concern for an acute stroke although patient quickly underwent head imaging including noncontrast CT head, CT angiogram of the head and neck as well as MRI brain which revealed no evidence of acute stroke.  The only abnormality found on MRI was evidence of several chronic microhemorrhages.  Neurology at that point felt that the most likely culprit for the patient's presentation was hypertensive encephalopathy.  They recommended admitting to medicine, completing encephalopathy work-up and titration of antihypertensive therapy.  The hospitalist group was then called to assess the patient for admission to the hospital.  New events last 24 hours / Subjective: Patient seen in hemodialysis.  He does admit to  having acute onset of bilateral neck adenopathy that started yesterday.  He has no other new physical complaints.  Assessment & Plan:   Principal Problem:   Hypertensive emergency Active Problems:   Anemia of chronic disease   Essential hypertension   Elevated troponin level not due myocardial infarction   ESRD (end stage renal disease) on dialysis (HCC)   Chronic combined systolic and diastolic congestive heart failure (HCC)   Mediastinal mass   Acute encephalopathy   Hypertensive emergency -Initial systolic blood pressure ranging 220-250 -Initially managed with IV clevidipine, transition to home medications including Norvasc, Coreg, hydralazine as well as IV as needed -Blood pressure early this morning ranging with systolic blood pressure 696-789.  Continue blood pressure management, goal reduction 20% daily  Acute metabolic encephalopathy -Work-up negative for stroke -Neurology believes this may be secondary to hypertensive encephalopathy versus dialysis disequilibrium -Resolved and back to baseline  Elevated troponin, secondary to demand ischemia -In setting of hypertensive emergency as well as underlying ESRD  ESRD on dialysis -Nephrology following  Chronic combined systolic and diastolic heart failure -Volume management with dialysis  Mediastinal mass as well as left upper lobe nodule -Has been evaluated at [facility], biopsy revealed cyst -Continue follow-up with [facility]  Neck lymphadenopathy -Evaluate with CT neck.  This was not seen on CTA neck at the time of admission, patient's complaint of adenopathy started 10/20    DVT prophylaxis:  SCDs Start: 10/30/19 2326  Code Status: Full Family Communication: None at bedside Disposition Plan:  Status is: Inpatient  Remains inpatient appropriate because:Hemodynamically unstable   Dispo: The patient is from: Home  Anticipated d/c is to: Home              Anticipated d/c date is: 2  days              Patient currently is not medically stable to d/c.  Remains with elevated pressure, dialysis today.  CT soft tissue neck ordered today   Consultants:   Neurology  Nephrology   Antimicrobials:  Anti-infectives (From admission, onward)   None       Objective: Vitals:   11/01/19 0750 11/01/19 0830 11/01/19 0856 11/01/19 0926  BP: (!) 211/116 (!) 191/93 (!) 200/107 (!) 195/126  Pulse:      Resp:   18 18  Temp:      TempSrc:      SpO2:      Weight:      Height:        Intake/Output Summary (Last 24 hours) at 11/01/2019 1022 Last data filed at 11/01/2019 0500 Gross per 24 hour  Intake 438.08 ml  Output --  Net 438.08 ml   Filed Weights   10/30/19 1924 10/31/19 2208 11/01/19 0738  Weight: 81 kg 78 kg 78 kg    Examination: General exam: Appears calm and comfortable  ENT: Bilateral lymphadenopathy present, left larger than compared to left, without pain to palpation Respiratory system: Clear to auscultation. Respiratory effort normal. Cardiovascular system: S1 & S2 heard, RRR. No pedal edema. Gastrointestinal system: Abdomen is nondistended, soft and nontender. Normal bowel sounds heard. Central nervous system: Alert and oriented. Non focal exam. Speech clear  Extremities: Symmetric in appearance bilaterally  Skin: No rashes, lesions or ulcers on exposed skin  Psychiatry: Judgement and insight appear stable. Mood & affect appropriate.    Data Reviewed: I have personally reviewed following labs and imaging studies  CBC: Recent Labs  Lab 10/30/19 1714 10/30/19 1719 10/31/19 0441 10/31/19 0442 11/01/19 0807  WBC 3.8*  --  3.9*  --  5.4  NEUTROABS 2.8  --  2.5  --   --   HGB 10.4* 10.5* 10.2* 10.9* 10.1*  HCT 31.3* 31.0* 31.4* 32.0* 28.6*  MCV 89.9  --  93.7  --  89.9  PLT 186  --  181  --  786   Basic Metabolic Panel: Recent Labs  Lab 10/30/19 1714 10/30/19 1719 10/31/19 0230 10/31/19 0442 11/01/19 0054  NA 138 136 134* 136 135  K  3.9 3.8 5.0 5.0 5.4*  CL 95* 97* 94*  --  95*  CO2 26  --  27  --  23  GLUCOSE 88 86 91  --  87  BUN 50* 51* 59*  --  75*  CREATININE 8.65* 9.30* 10.27*  --  12.69*  CALCIUM 10.6*  --  9.5  --  9.0   GFR: Estimated Creatinine Clearance: 7 mL/min (A) (by C-G formula based on SCr of 12.69 mg/dL (H)). Liver Function Tests: Recent Labs  Lab 10/30/19 1714 10/31/19 0230  AST 16 18  ALT 17 15  ALKPHOS 32* 30*  BILITOT 1.5* 1.0  PROT 7.5 6.8  ALBUMIN 4.2 3.7   No results for input(s): LIPASE, AMYLASE in the last 168 hours. Recent Labs  Lab 10/30/19 2030  AMMONIA 37*   Coagulation Profile: Recent Labs  Lab 10/30/19 1714  INR 1.2   Cardiac Enzymes: No results for input(s): CKTOTAL, CKMB, CKMBINDEX, TROPONINI in the last 168 hours. BNP (last 3 results) No results for input(s): PROBNP in the last 8760 hours. HbA1C: No results for  input(s): HGBA1C in the last 72 hours. CBG: No results for input(s): GLUCAP in the last 168 hours. Lipid Profile: No results for input(s): CHOL, HDL, LDLCALC, TRIG, CHOLHDL, LDLDIRECT in the last 72 hours. Thyroid Function Tests: Recent Labs    10/30/19 1951  TSH 1.826   Anemia Panel: Recent Labs    10/31/19 0441  VITAMINB12 410  FOLATE 33.5   Sepsis Labs: No results for input(s): PROCALCITON, LATICACIDVEN in the last 168 hours.  Recent Results (from the past 240 hour(s))  Resp Panel by RT PCR (RSV, Flu A&B, Covid) - Nasopharyngeal Swab     Status: None   Collection Time: 10/30/19  7:42 PM   Specimen: Nasopharyngeal Swab  Result Value Ref Range Status   SARS Coronavirus 2 by RT PCR NEGATIVE NEGATIVE Final    Comment: (NOTE) SARS-CoV-2 target nucleic acids are NOT DETECTED.  The SARS-CoV-2 RNA is generally detectable in upper respiratoy specimens during the acute phase of infection. The lowest concentration of SARS-CoV-2 viral copies this assay can detect is 131 copies/mL. A negative result does not preclude SARS-Cov-2 infection  and should not be used as the sole basis for treatment or other patient management decisions. A negative result may occur with  improper specimen collection/handling, submission of specimen other than nasopharyngeal swab, presence of viral mutation(s) within the areas targeted by this assay, and inadequate number of viral copies (<131 copies/mL). A negative result must be combined with clinical observations, patient history, and epidemiological information. The expected result is Negative.  Fact Sheet for Patients:  PinkCheek.be  Fact Sheet for Healthcare Providers:  GravelBags.it  This test is no t yet approved or cleared by the [place] FDA and  has been authorized for detection and/or diagnosis of SARS-CoV-2 by FDA under an Emergency Use Authorization (EUA). This EUA will remain  in effect (meaning this test can be used) for the duration of the COVID-19 declaration under Section 564(b)(1) of the Act, 21 U.S.C. section 360bbb-3(b)(1), unless the authorization is terminated or revoked sooner.     Influenza A by PCR NEGATIVE NEGATIVE Final   Influenza B by PCR NEGATIVE NEGATIVE Final    Comment: (NOTE) The Xpert Xpress SARS-CoV-2/FLU/RSV assay is intended as an aid in  the diagnosis of influenza from Nasopharyngeal swab specimens and  should not be used as a sole basis for treatment. Nasal washings and  aspirates are unacceptable for Xpert Xpress SARS-CoV-2/FLU/RSV  testing.  Fact Sheet for Patients: PinkCheek.be  Fact Sheet for Healthcare Providers: GravelBags.it  This test is not yet approved or cleared by the [place] FDA and  has been authorized for detection and/or diagnosis of SARS-CoV-2 by  FDA under an Emergency Use Authorization (EUA). This EUA will remain  in effect (meaning this test can be used) for the duration of the  Covid-19 declaration  under Section 564(b)(1) of the Act, 21  U.S.C. section 360bbb-3(b)(1), unless the authorization is  terminated or revoked.    Respiratory Syncytial Virus by PCR NEGATIVE NEGATIVE Final    Comment: (NOTE) Fact Sheet for Patients: PinkCheek.be  Fact Sheet for Healthcare Providers: GravelBags.it  This test is not yet approved or cleared by the [place] FDA and  has been authorized for detection and/or diagnosis of SARS-CoV-2 by  FDA under an Emergency Use Authorization (EUA). This EUA will remain  in effect (meaning this test can be used) for the duration of the  COVID-19 declaration under Section 564(b)(1) of the Act, 21 U.S.C.  section 360bbb-3(b)(1), unless  the authorization is terminated or  revoked. Performed at [facility], [place] [address], [place], [place] [address]   MRSA PCR Screening     Status: None   Collection Time: 10/31/19 11:35 PM   Specimen: Nasal Mucosa; Nasopharyngeal  Result Value Ref Range Status   MRSA by PCR NEGATIVE NEGATIVE Final    Comment:        The GeneXpert MRSA Assay (FDA approved for NASAL specimens only), is one component of a comprehensive MRSA colonization surveillance program. It is not intended to diagnose MRSA infection nor to guide or monitor treatment for MRSA infections. Performed at [facility], [place] [address] [place], [place] [address]       Radiology Studies: CT Code Stroke CTA Head W/WO contrast  Addendum Date: 10/30/2019   ADDENDUM REPORT: 10/30/2019 21:59 ADDENDUM: In addition to the initially described findings, note is made of a 2.5 x 1.6 cm irregular nodule at the medial left upper lobe. This is described in the initial finding section of the report, but omitted from the impression by mistake. Further evaluation with dedicated cross-sectional imaging of the chest recommended for complete characterization. Underlying emphysema noted as well. Emphysema  (ICD10-J43.9). Addended findings were communicated by telephone at the time of interpretation on 10/30/2019 at 9:56 pm to Dr. Curly Shores, who verbally acknowledged these results. Electronically Signed   By: Jeannine Boga M.D.   On: 10/30/2019 21:59   Result Date: 10/30/2019 CLINICAL DATA:  Initial evaluation for acute aphasia. EXAM: CT ANGIOGRAPHY HEAD AND NECK CT PERFUSION BRAIN TECHNIQUE: Multidetector CT imaging of the head and neck was performed using the standard protocol during bolus administration of intravenous contrast. Multiplanar CT image reconstructions and MIPs were obtained to evaluate the vascular anatomy. Carotid stenosis measurements (when applicable) are obtained utilizing NASCET criteria, using the distal internal carotid diameter as the denominator. Multiphase CT imaging of the brain was performed following IV bolus contrast injection. Subsequent parametric perfusion maps were calculated using RAPID software. CONTRAST:  180mL OMNIPAQUE IOHEXOL 350 MG/ML SOLN COMPARISON:  Prior head CT from earlier the same day. FINDINGS: CTA NECK FINDINGS Aortic arch: Visualized aortic arch of normal caliber with normal branch pattern. Mild atheromatous change seen about the arch and origin of the great vessels without hemodynamically significant stenosis or other acute finding. Right carotid system: Right common and internal carotid arteries patent without stenosis, dissection, or occlusion. Mild atheromatous plaque about the right bifurcation without significant stenosis. Left carotid system: Left common and internal carotid arteries patent without stenosis, dissection or occlusion. Mild atheromatous change about the left bifurcation/proximal left ICA without hemodynamically significant stenosis. Vertebral arteries: Both vertebral arteries arise from the subclavian arteries. No significant proximal subclavian artery stenosis. Left vertebral artery dominant with a diffusely hypoplastic right vertebral  artery. Atheromatous plaque at the origin of the left vertebral artery with approximate 50% ostial stenosis. Vertebral arteries otherwise widely patent within the neck without stenosis, dissection or occlusion. Skeleton: No visible acute osseous abnormality. No discrete lytic or blastic osseous lesions. Moderate cervical spondylosis noted at C6-7. Other neck: No other acute soft tissue abnormality within the neck. No mass lesion or adenopathy. Upper chest: Emphysema. Irregular nodular density measuring approximately 2.5 x 1.6 cm at the medial left upper lobe (series 20, image 178), indeterminate. Review of the MIP images confirms the above findings CTA HEAD FINDINGS Anterior circulation: Both internal carotid arteries patent to the termini without significant stenosis or other abnormality. Left A1 widely patent. Right A1 hypoplastic  and/or absent, accounting for the diminutive right ICA is compared to the left. Normal anterior communicating artery. Both anterior cerebral arteries patent to their distal aspects without stenosis. No M1 stenosis or occlusion. Normal MCA bifurcations. No proximal M2 branch occlusion. Distal MCA branches well perfused and symmetric. Posterior circulation: Dominant left vertebral artery widely patent to the vertebrobasilar junction. Patent left PICA. Hypoplastic right vertebral artery terminates in PICA. Right PICA patent as well. Basilar widely patent to its distal aspect without stenosis. Superior cerebral arteries patent bilaterally. Both PCAs well perfused to their distal aspects without stenosis. Small bilateral posterior communicating arteries noted. Venous sinuses: Grossly patent allowing for timing the contrast bolus. Anatomic variants: Hypoplastic/absent right A1 segment. Hypoplastic right vertebral artery terminates in PICA. No aneurysm. Review of the MIP images confirms the above findings CT Brain Perfusion Findings: ASPECTS: 10. CBF (<30%) Volume: 11mL Perfusion (Tmax>6.0s)  volume: 53mL Mismatch Volume: 9mL Infarction Location:Negative CT perfusion with no evidence for acute core infarct or other perfusion abnormality. IMPRESSION: CTA HEAD AND NECK IMPRESSION: 1. Negative CTA of the head and neck for emergent large vessel occlusion. 2. Mild atheromatous change about the aortic arch and carotid bifurcations without hemodynamically significant stenosis. 3. 50% atheromatous stenosis at the origin of the left vertebral artery. Left vertebral artery dominant. Right vertebral artery diffusely hypoplastic and terminates in PICA. CT PERFUSION IMPRESSION: Negative CT perfusion. No evidence for core infarct or other perfusion abnormality. Critical Value/emergent results were called by telephone at the time of interpretation on 10/30/2019 at 6:35 pm to provider MCNEILL [facility] , who verbally acknowledged these results. Electronically Signed: By: Jeannine Boga M.D. On: 10/30/2019 19:04   DG Chest 1 View  Result Date: 10/30/2019 CLINICAL DATA:  Elevated blood pressure. EXAM: CHEST  1 VIEW COMPARISON:  April 26, 2019 FINDINGS: The study is limited secondary to patient rotation. Mild perihilar pulmonary vascular prominence is noted. There is no evidence of acute infiltrate, pleural effusion or pneumothorax. There is moderate severity enlargement of the cardiac silhouette. The visualized skeletal structures are unremarkable. IMPRESSION: Cardiomegaly with mild pulmonary vascular congestion. Electronically Signed   By: Virgina Norfolk M.D.   On: 10/30/2019 23:14   CT Code Stroke CTA Neck W/WO contrast  Addendum Date: 10/30/2019   ADDENDUM REPORT: 10/30/2019 21:59 ADDENDUM: In addition to the initially described findings, note is made of a 2.5 x 1.6 cm irregular nodule at the medial left upper lobe. This is described in the initial finding section of the report, but omitted from the impression by mistake. Further evaluation with dedicated cross-sectional imaging of the chest  recommended for complete characterization. Underlying emphysema noted as well. Emphysema (ICD10-J43.9). Addended findings were communicated by telephone at the time of interpretation on 10/30/2019 at 9:56 pm to Dr. Curly Shores, who verbally acknowledged these results. Electronically Signed   By: Jeannine Boga M.D.   On: 10/30/2019 21:59   Result Date: 10/30/2019 CLINICAL DATA:  Initial evaluation for acute aphasia. EXAM: CT ANGIOGRAPHY HEAD AND NECK CT PERFUSION BRAIN TECHNIQUE: Multidetector CT imaging of the head and neck was performed using the standard protocol during bolus administration of intravenous contrast. Multiplanar CT image reconstructions and MIPs were obtained to evaluate the vascular anatomy. Carotid stenosis measurements (when applicable) are obtained utilizing NASCET criteria, using the distal internal carotid diameter as the denominator. Multiphase CT imaging of the brain was performed following IV bolus contrast injection. Subsequent parametric perfusion maps were calculated using RAPID software. CONTRAST:  13mL OMNIPAQUE IOHEXOL 350 MG/ML SOLN COMPARISON:  Prior head CT from earlier the same day. FINDINGS: CTA NECK FINDINGS Aortic arch: Visualized aortic arch of normal caliber with normal branch pattern. Mild atheromatous change seen about the arch and origin of the great vessels without hemodynamically significant stenosis or other acute finding. Right carotid system: Right common and internal carotid arteries patent without stenosis, dissection, or occlusion. Mild atheromatous plaque about the right bifurcation without significant stenosis. Left carotid system: Left common and internal carotid arteries patent without stenosis, dissection or occlusion. Mild atheromatous change about the left bifurcation/proximal left ICA without hemodynamically significant stenosis. Vertebral arteries: Both vertebral arteries arise from the subclavian arteries. No significant proximal subclavian artery  stenosis. Left vertebral artery dominant with a diffusely hypoplastic right vertebral artery. Atheromatous plaque at the origin of the left vertebral artery with approximate 50% ostial stenosis. Vertebral arteries otherwise widely patent within the neck without stenosis, dissection or occlusion. Skeleton: No visible acute osseous abnormality. No discrete lytic or blastic osseous lesions. Moderate cervical spondylosis noted at C6-7. Other neck: No other acute soft tissue abnormality within the neck. No mass lesion or adenopathy. Upper chest: Emphysema. Irregular nodular density measuring approximately 2.5 x 1.6 cm at the medial left upper lobe (series 20, image 178), indeterminate. Review of the MIP images confirms the above findings CTA HEAD FINDINGS Anterior circulation: Both internal carotid arteries patent to the termini without significant stenosis or other abnormality. Left A1 widely patent. Right A1 hypoplastic and/or absent, accounting for the diminutive right ICA is compared to the left. Normal anterior communicating artery. Both anterior cerebral arteries patent to their distal aspects without stenosis. No M1 stenosis or occlusion. Normal MCA bifurcations. No proximal M2 branch occlusion. Distal MCA branches well perfused and symmetric. Posterior circulation: Dominant left vertebral artery widely patent to the vertebrobasilar junction. Patent left PICA. Hypoplastic right vertebral artery terminates in PICA. Right PICA patent as well. Basilar widely patent to its distal aspect without stenosis. Superior cerebral arteries patent bilaterally. Both PCAs well perfused to their distal aspects without stenosis. Small bilateral posterior communicating arteries noted. Venous sinuses: Grossly patent allowing for timing the contrast bolus. Anatomic variants: Hypoplastic/absent right A1 segment. Hypoplastic right vertebral artery terminates in PICA. No aneurysm. Review of the MIP images confirms the above findings CT  Brain Perfusion Findings: ASPECTS: 10. CBF (<30%) Volume: 76mL Perfusion (Tmax>6.0s) volume: 64mL Mismatch Volume: 35mL Infarction Location:Negative CT perfusion with no evidence for acute core infarct or other perfusion abnormality. IMPRESSION: CTA HEAD AND NECK IMPRESSION: 1. Negative CTA of the head and neck for emergent large vessel occlusion. 2. Mild atheromatous change about the aortic arch and carotid bifurcations without hemodynamically significant stenosis. 3. 50% atheromatous stenosis at the origin of the left vertebral artery. Left vertebral artery dominant. Right vertebral artery diffusely hypoplastic and terminates in PICA. CT PERFUSION IMPRESSION: Negative CT perfusion. No evidence for core infarct or other perfusion abnormality. Critical Value/emergent results were called by telephone at the time of interpretation on 10/30/2019 at 6:35 pm to provider MCNEILL [facility] , who verbally acknowledged these results. Electronically Signed: By: Jeannine Boga M.D. On: 10/30/2019 19:04   MR BRAIN WO CONTRAST  Result Date: 10/30/2019 CLINICAL DATA:  59 year old male code stroke presentation today. Aphasia. EXAM: MRI HEAD WITHOUT CONTRAST TECHNIQUE: Multiplanar, multiecho pulse sequences of the brain and surrounding structures were obtained without intravenous contrast. COMPARISON:  CT head, CTA and CTP today. FINDINGS: The examination had to be discontinued prior to completion due to patient inability to continue. Axial DWI and FLAIR only  obtained. No restricted diffusion to suggest acute infarction. No intracranial mass effect. No ventriculomegaly. FLAIR images demonstrate patchy mild to moderate for age nonspecific bilateral cerebral white matter hyperintensity. No cortical encephalomalacia identified. FLAIR signal elsewhere within normal limits. However, trace DWI images suggest chronic microhemorrhage in the dorsal left thalamus (susceptibility on series 2, image 28), and perhaps also in the right  cerebellum. IMPRESSION: 1. Truncated exam, negative for acute infarct. 2. Mild to moderate for age nonspecific white matter signal changes. Several chronic microhemorrhages suspected. Electronically Signed   By: Genevie Ann M.D.   On: 10/30/2019 19:15   CT CEREBRAL PERFUSION W CONTRAST  Addendum Date: 10/30/2019   ADDENDUM REPORT: 10/30/2019 21:59 ADDENDUM: In addition to the initially described findings, note is made of a 2.5 x 1.6 cm irregular nodule at the medial left upper lobe. This is described in the initial finding section of the report, but omitted from the impression by mistake. Further evaluation with dedicated cross-sectional imaging of the chest recommended for complete characterization. Underlying emphysema noted as well. Emphysema (ICD10-J43.9). Addended findings were communicated by telephone at the time of interpretation on 10/30/2019 at 9:56 pm to Dr. Curly Shores, who verbally acknowledged these results. Electronically Signed   By: Jeannine Boga M.D.   On: 10/30/2019 21:59   Result Date: 10/30/2019 CLINICAL DATA:  Initial evaluation for acute aphasia. EXAM: CT ANGIOGRAPHY HEAD AND NECK CT PERFUSION BRAIN TECHNIQUE: Multidetector CT imaging of the head and neck was performed using the standard protocol during bolus administration of intravenous contrast. Multiplanar CT image reconstructions and MIPs were obtained to evaluate the vascular anatomy. Carotid stenosis measurements (when applicable) are obtained utilizing NASCET criteria, using the distal internal carotid diameter as the denominator. Multiphase CT imaging of the brain was performed following IV bolus contrast injection. Subsequent parametric perfusion maps were calculated using RAPID software. CONTRAST:  169mL OMNIPAQUE IOHEXOL 350 MG/ML SOLN COMPARISON:  Prior head CT from earlier the same day. FINDINGS: CTA NECK FINDINGS Aortic arch: Visualized aortic arch of normal caliber with normal branch pattern. Mild atheromatous change seen  about the arch and origin of the great vessels without hemodynamically significant stenosis or other acute finding. Right carotid system: Right common and internal carotid arteries patent without stenosis, dissection, or occlusion. Mild atheromatous plaque about the right bifurcation without significant stenosis. Left carotid system: Left common and internal carotid arteries patent without stenosis, dissection or occlusion. Mild atheromatous change about the left bifurcation/proximal left ICA without hemodynamically significant stenosis. Vertebral arteries: Both vertebral arteries arise from the subclavian arteries. No significant proximal subclavian artery stenosis. Left vertebral artery dominant with a diffusely hypoplastic right vertebral artery. Atheromatous plaque at the origin of the left vertebral artery with approximate 50% ostial stenosis. Vertebral arteries otherwise widely patent within the neck without stenosis, dissection or occlusion. Skeleton: No visible acute osseous abnormality. No discrete lytic or blastic osseous lesions. Moderate cervical spondylosis noted at C6-7. Other neck: No other acute soft tissue abnormality within the neck. No mass lesion or adenopathy. Upper chest: Emphysema. Irregular nodular density measuring approximately 2.5 x 1.6 cm at the medial left upper lobe (series 20, image 178), indeterminate. Review of the MIP images confirms the above findings CTA HEAD FINDINGS Anterior circulation: Both internal carotid arteries patent to the termini without significant stenosis or other abnormality. Left A1 widely patent. Right A1 hypoplastic and/or absent, accounting for the diminutive right ICA is compared to the left. Normal anterior communicating artery. Both anterior cerebral arteries patent to their distal aspects  without stenosis. No M1 stenosis or occlusion. Normal MCA bifurcations. No proximal M2 branch occlusion. Distal MCA branches well perfused and symmetric. Posterior  circulation: Dominant left vertebral artery widely patent to the vertebrobasilar junction. Patent left PICA. Hypoplastic right vertebral artery terminates in PICA. Right PICA patent as well. Basilar widely patent to its distal aspect without stenosis. Superior cerebral arteries patent bilaterally. Both PCAs well perfused to their distal aspects without stenosis. Small bilateral posterior communicating arteries noted. Venous sinuses: Grossly patent allowing for timing the contrast bolus. Anatomic variants: Hypoplastic/absent right A1 segment. Hypoplastic right vertebral artery terminates in PICA. No aneurysm. Review of the MIP images confirms the above findings CT Brain Perfusion Findings: ASPECTS: 10. CBF (<30%) Volume: 48mL Perfusion (Tmax>6.0s) volume: 54mL Mismatch Volume: 44mL Infarction Location:Negative CT perfusion with no evidence for acute core infarct or other perfusion abnormality. IMPRESSION: CTA HEAD AND NECK IMPRESSION: 1. Negative CTA of the head and neck for emergent large vessel occlusion. 2. Mild atheromatous change about the aortic arch and carotid bifurcations without hemodynamically significant stenosis. 3. 50% atheromatous stenosis at the origin of the left vertebral artery. Left vertebral artery dominant. Right vertebral artery diffusely hypoplastic and terminates in PICA. CT PERFUSION IMPRESSION: Negative CT perfusion. No evidence for core infarct or other perfusion abnormality. Critical Value/emergent results were called by telephone at the time of interpretation on 10/30/2019 at 6:35 pm to provider MCNEILL [facility] , who verbally acknowledged these results. Electronically Signed: By: Jeannine Boga M.D. On: 10/30/2019 19:04   EEG adult  Result Date: 10/30/2019 Greta Doom, MD     10/30/2019  9:10 PM History: 59 year old male presenting with aphasia Sedation: 2 mg Ativan given few hours prior to EEG Technique: This is a 21 channel routine scalp EEG performed at the  bedside with bipolar and monopolar montages arranged in accordance to the international 10/20 system of electrode placement. One channel was dedicated to EKG recording. Background: There is a posterior dominant rhythm of 7 to 8 Hz which is seen bilaterally.  In addition, there is admixed generalized irregular delta and theta range activities.  Clear sleep structures are not recorded. Photic stimulation: Physiologic driving is now performed EEG Abnormalities: 1) generalized irregular slow activity 2) slow posterior dominant rhythm Clinical Interpretation: This EEG is consistent with a generalized nonspecific cerebral dysfunction (encephalopathy). There was no seizure or seizure predisposition recorded on this study. Please note that lack of epileptiform activity on EEG does not preclude the possibility of epilepsy. Roland Rack, MD [facility] [phone] If 7pm- 7am, please page neurology on call as listed in [place].   CT HEAD CODE STROKE WO CONTRAST  Result Date: 10/30/2019 CLINICAL DATA:  Code stroke.  Aphasia EXAM: CT HEAD WITHOUT CONTRAST TECHNIQUE: Contiguous axial images were obtained from the base of the skull through the vertex without intravenous contrast. COMPARISON:  02/17/2019 FINDINGS: Brain: Normal appearance without evidence of atrophy, old or acute infarction, mass lesion, hemorrhage, hydrocephalus or extra-axial collection. Vascular: There is atherosclerotic calcification of the major vessels at the base of the brain. Skull: Negative Sinuses/Orbits: Clear/normal Other: None ASPECTS ([place] Stroke Program Early CT Score) - Ganglionic level infarction (caudate, lentiform nuclei, internal capsule, insula, M1-M3 cortex): 7 - Supraganglionic infarction (M4-M6 cortex): 3 Total score (0-10 with 10 being normal): 10 IMPRESSION: 1. Normal head CT for age. Atherosclerotic calcification of the major vessels at the base of the brain. 2. ASPECTS is 10. 3. These results were communicated  to Dr. Leonel Ramsay at 5:31 pmon 10/19/2021by text page  via the [employer] system. Electronically Signed   By: Nelson Chimes M.D.   On: 10/30/2019 17:32      Scheduled Meds: . amLODipine  10 mg Oral Daily  . carvedilol  25 mg Oral BID WC  . Chlorhexidine Gluconate Cloth  6 each Topical Q0600  . darbepoetin (ARANESP) injection - DIALYSIS  40 mcg Intravenous Q Wed-HD  . doxercalciferol  2 mcg Intravenous Q M,W,F-HD  . heparin sodium (porcine)      . hydrALAZINE  50 mg Oral TID   Continuous Infusions: . ferric gluconate (FERRLECIT/NULECIT) IV Stopped (10/31/19 1521)     LOS: 1 day      Time spent: 35 minutes   Dessa Phi, DO [facility] 11/01/2019, 10:22 AM   Available via Epic secure chat 7am-7pm After these hours, please refer to coverage provider listed on [email or web address]

## 2019-11-01 NOTE — Progress Notes (Signed)
[facility] Progress Note  Subjective: seen on HD, no c/o, no sob or cough  Vitals:   11/01/19 0856 11/01/19 0926 11/01/19 1321 11/01/19 1342  BP: (!) 200/107 (!) 195/126 (!) 210/114 (!) 203/122  Pulse:      Resp: 18 18 19    Temp:   98.7 F (37.1 C)   TempSrc:   Oral   SpO2:   97%   Weight:      Height:        Exam: Gen alert, no distress No rash, cyanosis or gangrene Sclera anicteric, throat clear  No jvd or bruits Chest clear bilat to bases RRR no RG, 2/6 sem Abd soft ntnd no mass or ascites +bs GU normal male MS no joint effusions or deformity Ext 1+ LE edema, no wounds or ulcers Neuro is alert, Ox 3 , nf  RUA aVF+bruit    [facility]  4h 61min   450/800   76kg  2K/3.5Ca bath  RUA AVF Hep 6000    - mircera 60 q 2, next on 10/19, last Hb 11  - venofer 50 q wk  - hect 2ug tiw  CXR 10/19 - vasc congestion  Assessment/ Plan: 1. HTN urgency - suspect vol overload d/t missed HD x 2 last week. States he is compliant w/ BP meds. Ordered home BP meds. Getting vol down w/ HD this am, 4 L off. Will follow.  2. AMS / aphasia - MRI neg for CVA, possible PRES/ HTN related.  3. ESRD - on HD x 2 yrs, TTS. Had HD yest. Missed HD x 2 thurs/ Sat last week. HD today.  4. Anemia ckd -  Due for esa now, darbe 40 q wk here, cont iv fe  5. 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   Inpatient medications:  amLODipine  10 mg Oral Daily   carvedilol  25 mg Oral BID WC   Chlorhexidine Gluconate Cloth  6 each Topical Q0600   darbepoetin (ARANESP) injection - DIALYSIS  40 mcg Intravenous Q Wed-HD   doxercalciferol  2 mcg  Intravenous Q M,W,F-HD   heparin sodium (porcine)       hydrALAZINE  50 mg Oral TID    ferric gluconate (FERRLECIT/NULECIT) IV Stopped (10/31/19 1521)   acetaminophen **OR** acetaminophen, hydrALAZINE, ondansetron **OR** ondansetron (ZOFRAN) IV, polyvinyl alcohol

## 2019-11-01 NOTE — Progress Notes (Signed)
Pt confused and restless over past hour. Disoriented to time and situation, and then refused to answer orientation questions. Pulling tele leads. BP 204/107, hydralazine PRN given at 0432. Opyd, MD notified.  New order for labetalol placed. Med administered as ordered, will continue to fallow.

## 2019-11-01 NOTE — Progress Notes (Signed)
At this time, pt resting comfortably in his bed.

## 2019-11-01 NOTE — Progress Notes (Signed)
Pt. Returned back to floor from HD, BP: 210/114 HR: 76 RR: 19 O2: 97 RA Temp: 98.7, IV access lost. MD notified of BP, said to give PO meds now and push next dose out 6 hours. Meds given. Pharmacy called to change dose.

## 2019-11-02 LAB — BASIC METABOLIC PANEL WITH GFR
Anion gap: 12 (ref 5–15)
BUN: 43 mg/dL — ABNORMAL HIGH (ref 6–20)
CO2: 28 mmol/L (ref 22–32)
Calcium: 9.1 mg/dL (ref 8.9–10.3)
Chloride: 94 mmol/L — ABNORMAL LOW (ref 98–111)
Creatinine, Ser: 8.7 mg/dL — ABNORMAL HIGH (ref 0.61–1.24)
GFR, Estimated: 7 mL/min — ABNORMAL LOW
Glucose, Bld: 120 mg/dL — ABNORMAL HIGH (ref 70–99)
Potassium: 4.5 mmol/L (ref 3.5–5.1)
Sodium: 134 mmol/L — ABNORMAL LOW (ref 135–145)

## 2019-11-02 LAB — HEPATITIS B SURFACE ANTIBODY, QUANTITATIVE: Hep B S AB Quant (Post): <3.1 m[IU]/mL — ABNORMAL LOW

## 2019-11-02 MED ORDER — HYDRALAZINE HCL 50 MG PO TABS
100.0000 mg | ORAL_TABLET | Freq: Three times a day (TID) | ORAL | Status: DC
  Administered 2019-11-02 – 2019-11-03 (×4): 100 mg via ORAL
  Filled 2019-11-02 (×5): qty 2

## 2019-11-02 NOTE — Progress Notes (Addendum)
Renal Navigator received message that patient may be medically ready for discharge tomorrow, which is his scheduled dialysis day. His outpatient seat time is 11:30am at [facility], [address], [place]. He should try to arrive to his appointments by 11:10am. Navigator met with patient to see if he has transportation to his outpatient [facility] clinic[facility] at discharge in order to go to his clinic for his treatment tomorrow, Saturday, 11/03/19 IF he is medically cleared in the morning. Navigator explained that the advantage to this is that it will get him discharged from the hospital sooner, which is a benefit not only to him but to other patients who need treatment in the hospital. Patient is reluctant, and then refuses to make plans with Navigator, stating that he is particular about his hygiene products, that he does not have with him in the hospital. He states he will be too rushed if he is discharged in the morning before dialysis because he will not be able to get himself "washed up" before going. 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 Navigator again suggests that we get him transportation from the hospital tomorrow at discharge (if he is discharging) straight to his clinic so he can have treatment AND get his car. He replies, "you make it sound so simple." Navigator is unsure what exactly patient's reservations are, but he wants to see how he feels in the morning. Ideally, he states he would like to be discharged later today. Navigator does not think that is an option, but states he can discuss that with his medical team. As Navigator was leaving his room, patient was calling out to see when his BP was last checked.  Navigator updated Nephrologist. Patient can be dialyzed on day of discharge, but it is not ideal since he can go to his outpatient clinic, leaving space in the small inpatient unit for patients who cannot dialyze elsewhere. However, if he feels it is too stressful  or unmanageable to leave prior to HD, we will add him to the schedule. We cannot prioritize his treatment just to expedite discharge, as treatments are scheduled based on medical need.   Alphonzo Cruise, [place] Renal Navigator [phone]

## 2019-11-02 NOTE — Progress Notes (Signed)
[facility] Progress Note  Subjective: seen on HD, no c/o, no sob or cough  Vitals:   11/01/19 1920 11/01/19 2323 11/02/19 0405 11/02/19 0727  BP: (!) 163/96 (!) 143/89 (!) 180/96   Pulse: 69 63 63   Resp: 18 19 20    Temp: 98.5 F (36.9 C) 98.6 F (37 C) 98.5 F (36.9 C) 98.6 F (37 C)  TempSrc: Oral Oral Oral   SpO2: 98% 98% 98%   Weight:      Height:        Exam: Gen alert, no distress No rash, cyanosis or gangrene Sclera anicteric, throat clear  No jvd or bruits Chest clear bilat to bases RRR no RG, 2/6 sem Abd soft ntnd no mass or ascites +bs GU normal male MS no joint effusions or deformity Ext 1+ LE edema, no wounds or ulcers Neuro is alert, Ox 3 , nf  RUA aVF+bruit    [facility] HD: [facility]  4h 54min   450/800   76kg  2K/3.5Ca bath  RUA AVF Hep 6000    - mircera 60 q 2, next on 10/19, last Hb 11  - venofer 50 q wk  - hect 2ug tiw  CXR 10/19 - vasc congestion  Assessment/ Plan: 1. HTN urgency - suspect vol overload d/t missed HD x 2 last week. States he is compliant w/ BP meds. Ordered home BP meds. Getting vol down w/ HD this am, 4 L off. Will follow.  2. AMS / aphasia - MRI neg for CVA, possible PRES/ HTN related.  3. ESRD - on HD x 2 yrs, TTS. Had HD yest. Missed HD x 2 thurs/ Sat last week. HD today.  4. 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   Inpatient medications:  amLODipine  10 mg Oral Daily   carvedilol  25 mg Oral BID WC   Chlorhexidine Gluconate Cloth  6 each Topical Q0600   darbepoetin (ARANESP) injection - DIALYSIS  40 mcg  Intravenous Q Wed-HD   doxercalciferol  2 mcg Intravenous Q M,W,F-HD   hydrALAZINE  100 mg Oral TID    ferric gluconate (FERRLECIT/NULECIT) IV Stopped (10/31/19 1521)   acetaminophen **OR** acetaminophen, hydrALAZINE, ondansetron **OR** ondansetron (ZOFRAN) IV, polyvinyl alcohol

## 2019-11-02 NOTE — Progress Notes (Signed)
PROGRESS NOTE    Jason [name].  FKC:[id number] DOB: [birth year] DOA: 10/30/2019 PCP: [name], FNP     Brief Narrative:  Jason [name]. 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  According to emergency department staff and reports by EMS, patient underwent 3 hours 37 minutes of hemodialysis earlier today.  Throughout the dialysis session, blood pressures were higher than usual reportedly 210/130 by EMS.  By the end of the patient's hemodialysis session the patient was exhibiting lethargy and inability to verbalize answers to questions.  EMS was then called and patient was brought into [facility] emergency department as a code stroke.  Upon arrival, patient underwent prompt evaluation by Dr. Leonel Ramsay with neurology.  Initially there was concern for an acute stroke although patient quickly underwent head imaging including noncontrast CT head, CT angiogram of the head and neck as well as MRI brain which revealed no evidence of acute stroke.  The only abnormality found on MRI was evidence of several chronic microhemorrhages.  Neurology at that point felt that the most likely culprit for the patient's presentation was hypertensive encephalopathy.  They recommended admitting to medicine, completing encephalopathy work-up and titration of antihypertensive therapy.  The hospitalist group was then called to assess the patient for admission to the hospital.  New events last 24 hours / Subjective: Seen and examined in the room.  He has no  complaints.  Assessment & Plan:   Principal Problem:   Hypertensive emergency Active Problems:   Anemia of chronic disease   Essential hypertension   Elevated troponin level not due myocardial infarction   ESRD (end stage renal disease) on dialysis (HCC)   Chronic combined systolic and diastolic congestive heart failure (HCC)   Mediastinal mass   Acute encephalopathy   Hypertensive emergency -Initial systolic blood pressure ranging 220-250 -Initially managed with IV clevidipine, transition to home medications including Norvasc, Coreg, hydralazine as well as IV as needed.  Blood pressure still elevated.  Will double the dose of hydralazine 200 mg 3 times daily and continue rest of the medications and monitor for another 24 hours.  Acute metabolic encephalopathy -Work-up negative for stroke -Neurology believes this may be secondary to hypertensive encephalopathy versus dialysis disequilibrium -Resolved and back to baseline.  Elevated troponin, secondary to demand ischemia -In setting of hypertensive emergency as well as underlying ESRD  ESRD on dialysis -Nephrology following.  Next [employer].  Chronic combined systolic and diastolic heart failure -Volume management with dialysis.  Euvolemic.  Mediastinal mass as well as left upper lobe nodule -Has been evaluated at [facility], biopsy revealed cyst -Continue follow-up with [facility]  Neck lymphadenopathy CT soft tissue neck without contrast shows no mass or adenopathy in the neck.  This once again shows left upper lobe nodule and bullous emphysema.  For this, patient has been evaluated at [facility] and biopsy is done.  DVT prophylaxis:  SCDs Start: 10/30/19 2326  Code Status: Full Family Communication: None at bedside Disposition Plan:  Status is: Inpatient  Remains inpatient appropriate because:Hemodynamically unstable   Dispo: The  patient is from: Home              Anticipated d/c is to: Home               Anticipated d/c date is: 1 day              Patient currently is not medically stable to d/c.  Remains with elevated pressure   Consultants:   Neurology  Nephrology   Antimicrobials:  Anti-infectives (From admission, onward)   None       Objective: Vitals:   11/01/19 1920 11/01/19 2323 11/02/19 0405 11/02/19 0727  BP: (!) 163/96 (!) 143/89 (!) 180/96   Pulse: 69 63 63   Resp: 18 19 20    Temp: 98.5 F (36.9 C) 98.6 F (37 C) 98.5 F (36.9 C) 98.6 F (37 C)  TempSrc: Oral Oral Oral   SpO2: 98% 98% 98%   Weight:      Height:        Intake/Output Summary (Last 24 hours) at 11/02/2019 1129 Last data filed at 11/01/2019 2340 Gross per 24 hour  Intake 240 ml  Output 4100 ml  Net -3860 ml   Filed Weights   10/31/19 2208 11/01/19 0738 11/01/19 1135  Weight: 78 kg 78 kg 74 kg    Examination:  General exam: Appears calm and comfortable  Respiratory system: Clear to auscultation. Respiratory effort normal. Cardiovascular system: S1 & S2 heard, RRR. No JVD, murmurs, rubs, gallops or clicks. No pedal edema. Gastrointestinal system: Abdomen is nondistended, soft and nontender. No organomegaly or masses felt. Normal bowel sounds heard. Central nervous system: Alert and oriented. No focal neurological deficits. Extremities: Symmetric 5 x 5 power. Skin: No rashes, lesions or ulcers.  Psychiatry: Judgement and insight appear normal. Mood & affect appropriate.   Data Reviewed: I have personally reviewed following labs and imaging studies  CBC: Recent Labs  Lab 10/30/19 1714 10/30/19 1719 10/31/19 0441 10/31/19 0442 11/01/19 0807  WBC 3.8*  --  3.9*  --  5.4  NEUTROABS 2.8  --  2.5  --   --   HGB 10.4* 10.5* 10.2* 10.9* 10.1*  HCT 31.3* 31.0* 31.4* 32.0* 28.6*  MCV 89.9  --  93.7  --  89.9  PLT 186  --  181  --  102   Basic Metabolic Panel: Recent Labs  Lab 10/30/19 1714 10/30/19 1714 10/30/19 1719 10/31/19 0230 10/31/19 0442 11/01/19 0054  11/02/19 0105  NA 138   < > 136 134* 136 135 134*  K 3.9   < > 3.8 5.0 5.0 5.4* 4.5  CL 95*  --  97* 94*  --  95* 94*  CO2 26  --   --  27  --  23 28  GLUCOSE 88  --  86 91  --  87 120*  BUN 50*  --  51* 59*  --  75* 43*  CREATININE 8.65*  --  9.30* 10.27*  --  12.69* 8.70*  CALCIUM 10.6*  --   --  9.5  --  9.0 9.1   < > = values in this interval not displayed.   GFR: Estimated Creatinine Clearance: 9.7 mL/min (A) (by C-G formula based on SCr of 8.7 mg/dL (H)). Liver Function Tests: Recent Labs  Lab 10/30/19 1714 10/31/19 0230  AST 16 18  ALT 17 15  ALKPHOS 32* 30*  BILITOT 1.5* 1.0  PROT 7.5 6.8  ALBUMIN 4.2 3.7   No results for input(s): LIPASE, AMYLASE  in the last 168 hours. Recent Labs  Lab 10/30/19 2030  AMMONIA 37*   Coagulation Profile: Recent Labs  Lab 10/30/19 1714  INR 1.2   Cardiac Enzymes: No results for input(s): CKTOTAL, CKMB, CKMBINDEX, TROPONINI in the last 168 hours. BNP (last 3 results) No results for input(s): PROBNP in the last 8760 hours. HbA1C: No results for input(s): HGBA1C in the last 72 hours. CBG: No results for input(s): GLUCAP in the last 168 hours. Lipid Profile: No results for input(s): CHOL, HDL, LDLCALC, TRIG, CHOLHDL, LDLDIRECT in the last 72 hours. Thyroid Function Tests: Recent Labs    10/30/19 1951  TSH 1.826   Anemia Panel: Recent Labs    10/31/19 0441  VITAMINB12 410  FOLATE 33.5   Sepsis Labs: No results for input(s): PROCALCITON, LATICACIDVEN in the last 168 hours.  Recent Results (from the past 240 hour(s))  Resp Panel by RT PCR (RSV, Flu A&B, Covid) - Nasopharyngeal Swab     Status: None   Collection Time: 10/30/19  7:42 PM   Specimen: Nasopharyngeal Swab  Result Value Ref Range Status   SARS Coronavirus 2 by RT PCR NEGATIVE NEGATIVE Final    Comment: (NOTE) SARS-CoV-2 target nucleic acids are NOT DETECTED.  The SARS-CoV-2 RNA is generally detectable in upper respiratoy specimens during the acute  phase of infection. The lowest concentration of SARS-CoV-2 viral copies this assay can detect is 131 copies/mL. A negative result does not preclude SARS-Cov-2 infection and should not be used as the sole basis for treatment or other patient management decisions. A negative result may occur with  improper specimen collection/handling, submission of specimen other than nasopharyngeal swab, presence of viral mutation(s) within the areas targeted by this assay, and inadequate number of viral copies (<131 copies/mL). A negative result must be combined with clinical observations, patient history, and epidemiological information. The expected result is Negative.  Fact Sheet for Patients:  PinkCheek.be  Fact Sheet for Healthcare Providers:  GravelBags.it  This test is no t yet approved or cleared by the [place] FDA and  has been authorized for detection and/or diagnosis of SARS-CoV-2 by FDA under an Emergency Use Authorization (EUA). This EUA will remain  in effect (meaning this test can be used) for the duration of the COVID-19 declaration under Section 564(b)(1) of the Act, 21 U.S.C. section 360bbb-3(b)(1), unless the authorization is terminated or revoked sooner.     Influenza A by PCR NEGATIVE NEGATIVE Final   Influenza B by PCR NEGATIVE NEGATIVE Final    Comment: (NOTE) The Xpert Xpress SARS-CoV-2/FLU/RSV assay is intended as an aid in  the diagnosis of influenza from Nasopharyngeal swab specimens and  should not be used as a sole basis for treatment. Nasal washings and  aspirates are unacceptable for Xpert Xpress SARS-CoV-2/FLU/RSV  testing.  Fact Sheet for Patients: PinkCheek.be  Fact Sheet for Healthcare Providers: GravelBags.it  This test is not yet approved or cleared by the [place] FDA and  has been authorized for detection and/or diagnosis of  SARS-CoV-2 by  FDA under an Emergency Use Authorization (EUA). This EUA will remain  in effect (meaning this test can be used) for the duration of the  Covid-19 declaration under Section 564(b)(1) of the Act, 21  U.S.C. section 360bbb-3(b)(1), unless the authorization is  terminated or revoked.    Respiratory Syncytial Virus by PCR NEGATIVE NEGATIVE Final    Comment: (NOTE) Fact Sheet for Patients: PinkCheek.be  Fact Sheet for Healthcare Providers: GravelBags.it  This test is not  yet approved or cleared by the [place] and  has been authorized for detection and/or diagnosis of SARS-CoV-2 by  FDA under an Emergency Use Authorization (EUA). This EUA will remain  in effect (meaning this test can be used) for the duration of the  COVID-19 declaration under Section 564(b)(1) of the Act, 21 U.S.C.  section 360bbb-3(b)(1), unless the authorization is terminated or  revoked. Performed at [facility], [place] [address] [place], [place] [address]   MRSA PCR Screening     Status: None   Collection Time: 10/31/19 11:35 PM   Specimen: Nasal Mucosa; Nasopharyngeal  Result Value Ref Range Status   MRSA by PCR NEGATIVE NEGATIVE Final    Comment:        The GeneXpert MRSA Assay (FDA approved for NASAL specimens only), is one component of a comprehensive MRSA colonization surveillance program. It is not intended to diagnose MRSA infection nor to guide or monitor treatment for MRSA infections. Performed at [facility], [place] [address], [place], [place] [address]       Radiology Studies: CT SOFT TISSUE NECK WO CONTRAST  Result Date: 11/01/2019 CLINICAL DATA:  Cervical lymphadenopathy. EXAM: CT NECK WITHOUT CONTRAST TECHNIQUE: Multidetector CT imaging of the neck was performed following the standard protocol without intravenous contrast. COMPARISON:  CT angio head and neck 10/30/2019 FINDINGS: Pharynx and larynx:  Normal. No mass or swelling. Salivary glands: No inflammation, mass, or stone. Thyroid: Negative Lymph nodes: Evaluation of lymph nodes limited by lack of intravenous contrast and lack of body fat. Review of the prior CTA reveals no enlarged lymph nodes in the neck. Vascular: Limited evaluation without intravenous contrast. Limited intracranial: Negative Visualized orbits: Negative Mastoids and visualized paranasal sinuses: Paranasal sinuses clear bilaterally. Mastoid clear bilaterally. Skeleton: Mild degenerative changes in the cervical spine. No acute abnormality. Upper chest: Bullous emphysema left upper lobe. 20 x 17 mm left upper lobe nodule unchanged from the recent study. This is low density on the prior study and could represent bronchial atresia with mucocele. Neoplasm not excluded. Other: None IMPRESSION: Negative for mass or adenopathy in the neck. Evaluation for lymph nodes is difficult without intravenous contrast however review of the recent CTA demonstrates no adenopathy. Left upper lobe nodule and adjacent bullous emphysema. 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 Recommend dedicated CT chest for further evaluation Electronically Signed   By: Franchot Gallo M.D.   On: 11/01/2019 16:31      Scheduled Meds: . amLODipine  10 mg Oral Daily  . carvedilol  25 mg Oral BID WC  . Chlorhexidine Gluconate Cloth  6 each Topical Q0600  . darbepoetin (ARANESP) injection - DIALYSIS  40 mcg Intravenous Q Wed-HD  . doxercalciferol  2 mcg Intravenous Q M,W,F-HD  . hydrALAZINE  100 mg Oral TID   Continuous Infusions: . ferric gluconate (FERRLECIT/NULECIT) IV Stopped (10/31/19 1521)     LOS: 2 days   Time spent: 31 minutes   Darliss Cheney, MD [facility] 11/02/2019, 11:29 AM   Available via Epic secure chat 7am-7pm After these hours, please refer to coverage provider listed on [email or web address]

## 2019-11-03 LAB — TROPONIN I (HIGH SENSITIVITY)
Troponin I (High Sensitivity): 81 ng/L — ABNORMAL HIGH (ref <18)
Troponin I (High Sensitivity): 85 ng/L — ABNORMAL HIGH (ref <18)

## 2019-11-03 LAB — RENAL FUNCTION PANEL
Albumin: 3.3 g/dL — ABNORMAL LOW (ref 3.5–5.0)
Anion gap: 14 (ref 5–15)
BUN: 76 mg/dL — ABNORMAL HIGH (ref 6–20)
CO2: 26 mmol/L (ref 22–32)
Calcium: 8.9 mg/dL (ref 8.9–10.3)
Chloride: 94 mmol/L — ABNORMAL LOW (ref 98–111)
Creatinine, Ser: 12.1 mg/dL — ABNORMAL HIGH (ref 0.61–1.24)
GFR, Estimated: 4 mL/min — ABNORMAL LOW (ref >60)
Glucose, Bld: 161 mg/dL — ABNORMAL HIGH (ref 70–99)
Phosphorus: 7.4 mg/dL — ABNORMAL HIGH (ref 2.5–4.6)
Potassium: 4.4 mmol/L (ref 3.5–5.1)
Sodium: 134 mmol/L — ABNORMAL LOW (ref 135–145)

## 2019-11-03 LAB — CBC
HCT: 28.2 % — ABNORMAL LOW (ref 39.0–52.0)
Hemoglobin: 9.6 g/dL — ABNORMAL LOW (ref 13.0–17.0)
MCH: 30.6 pg (ref 26.0–34.0)
MCHC: 34 g/dL (ref 30.0–36.0)
MCV: 89.8 fL (ref 80.0–100.0)
Platelets: 204 10*3/uL (ref 150–400)
RBC: 3.14 MIL/uL — ABNORMAL LOW (ref 4.22–5.81)
RDW: 12.4 % (ref 11.5–15.5)
WBC: 5.1 10*3/uL (ref 4.0–10.5)
nRBC: 0 % (ref 0.0–0.2)

## 2019-11-03 MED ORDER — HEPARIN SODIUM (PORCINE) 1000 UNIT/ML DIALYSIS: 6000.0000 [IU] | Freq: Once | INTRAMUSCULAR | Status: AC

## 2019-11-03 MED ORDER — HYDRALAZINE HCL 100 MG PO TABS: 100.0000 mg | ORAL_TABLET | Freq: Three times a day (TID) | ORAL | 0 refills | Status: DC

## 2019-11-03 MED ORDER — HEPARIN SODIUM (PORCINE) 1000 UNIT/ML DIALYSIS: 6000.0000 [IU] | Freq: Once | INTRAMUSCULAR | Status: DC

## 2019-11-03 MED ORDER — HEPARIN SODIUM (PORCINE) 1000 UNIT/ML IJ SOLN
INTRAMUSCULAR | Status: AC
  Administered 2019-11-03: 6000 [IU] via INTRAVENOUS_CENTRAL
  Filled 2019-11-03: qty 6

## 2019-11-03 NOTE — Progress Notes (Signed)
Notified MD of ST depression and T wave depression per Central monitoring.  Will get EKG.

## 2019-11-03 NOTE — Progress Notes (Signed)
Per Dr. Milus Glazier, [place] to DC patient.

## 2019-11-03 NOTE — Discharge Instructions (Signed)

## 2019-11-03 NOTE — Progress Notes (Signed)
[facility] Progress Note  Subjective: BP's 139/ 84  Vitals:   11/03/19 0559 11/03/19 0739 11/03/19 0800 11/03/19 1123  BP: (!) 193/99  (!) 170/92 139/84  Pulse:   64   Resp:      Temp:  98.2 F (36.8 C)    TempSrc:  Oral    SpO2:  93%    Weight:      Height:        Exam: Gen alert, no distress No jvd or bruits Chest clear bilat to bases RRR no RG, 2/6 sem Abd soft ntnd no mass or ascites +bs Ext 1+ LE edema, no wounds or ulcers Neuro is alert, Ox 3 , nf  RUA aVF+bruit    OP HD: [facility]  4h 37min   450/800   76kg  2K/3.5Ca bath  RUA AVF Hep 6000    - mircera 60 q 2, next on 10/19, last Hb 11  - venofer 50 q wk  - hect 2ug tiw  CXR 10/19 - vasc congestion  Assessment/ Plan: 1. HTN urgency - suspect vol overload d/t missed HD x 2 last week. States he is compliant w/ BP meds. He is below his edw, may need to lower at dc. HD today. BP's good this am. Hydralazine ^'d while here, norvasc/ coreg getting home dose. Ok for [employer] after HD.  2. AMS / aphasia - MRI neg for CVA, possible PRES/ HTN related.  3. ESRD - on HD x 2 yrs, TTS. Missed HD x 2. HD today, doesn't want to go to OP unit today, plan 2nd shift HD here.  4. Anemia ckd -  Due for esa now, darbe 40 q wk here, cont iv fe  5. MBD ckd - cont vdra w/ hd      Rob Erol Flanagin 11/03/2019, 11:51 AM   Recent Labs  Lab 10/31/19 0230 10/31/19 0442 11/01/19 0054 11/01/19 0807 11/02/19 0105  K   < > 5.0 5.4*  --  4.5  BUN   < >  --  75*  --  43*  CREATININE   < >  --  12.69*  --  8.70*  CALCIUM   < >  --  9.0  --  9.1  HGB  --  10.9*  --  10.1*  --    < > = values in this interval not displayed.   Inpatient medications: . amLODipine  10 mg Oral Daily  . carvedilol  25 mg Oral BID WC  . Chlorhexidine Gluconate Cloth  6 each Topical Q0600  . darbepoetin (ARANESP) injection - DIALYSIS  40 mcg Intravenous Q Wed-HD  . doxercalciferol  2 mcg Intravenous Q M,W,F-HD  . hydrALAZINE  100 mg Oral TID    . ferric gluconate (FERRLECIT/NULECIT) IV Stopped (10/31/19 1521)   acetaminophen **OR** acetaminophen, hydrALAZINE, ondansetron **OR** ondansetron (ZOFRAN) IV, polyvinyl alcohol

## 2019-11-03 NOTE — Progress Notes (Signed)
Gave 10 am meds early per MD order.

## 2019-11-03 NOTE — Plan of Care (Signed)
  Problem: Education: Goal: Knowledge of General Education information will improve Description Including pain rating scale, medication(s)/side effects and non-pharmacologic comfort measures Outcome: Progressing   

## 2019-11-03 NOTE — Discharge Summary (Signed)
Physician Discharge Summary  Jason [name]. FBP:[id number] DOB: 1960-01-12 DOA: 10/30/2019  PCP: [name], FNP  Admit date: 10/30/2019 Discharge date: 11/03/2019  Admitted From: Home Disposition: Home  Recommendations for Outpatient Follow-up:  1. Follow up with PCP in 1-2 weeks 2. Please obtain BMP/CBC in one week 3. Please follow up with your PCP on the following pending results: Unresulted Labs (From admission, onward)         None       Home Health: None Equipment/Devices: None  Discharge Condition: Stable CODE STATUS: Full code Diet recommendation: Low-sodium/renal  Subjective: Seen and examined.  No complaints.  Brief/Interim Summary: Jason Mcevoy. 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  According to emergency department staff and reports by EMS, patient underwent 3 hours 37 minutes of hemodialysis.Throughout the dialysis session, blood pressures were higher than usual reportedly 210/130 by EMS. By the end of the patient's hemodialysis session the patient was exhibiting lethargy and inability to verbalize answers to questions. EMS was then called and patient was brought into [facility] emergency department as a code stroke.  Upon arrival, patient underwent prompt evaluation by Dr. Leonel Ramsay with neurology. Initially there was concern for an acute stroke although patient quickly underwent head imaging including noncontrast CT head, CT angiogram of the head and neck as well as MRI brain which revealed no evidence of acute stroke.  The only abnormality found on MRI was evidence of several chronic microhemorrhages. Neurology at that point felt that the most likely culprit for the patient's presentation was hypertensive encephalopathy. They recommended admitting to medicine, completing encephalopathy work-up and titration of antihypertensive therapy. The hospitalist group was then called to assess the patient for admission to the hospital.  Patient's hypertensive encephalopathy/hypertensive emergency was initially managed with IV Cleviprex and he was subsequently transitioned to his oral medications which included Norvasc Coreg and hydralazine.  With improvement in his blood pressure, his encephalopathy improved.  He had slightly elevated troponin which were flat which was likely secondary to demand ischemia.  Nephrology was on board the he underwent his regular dialysis here.  Subsequently, his hydralazine was increased to 100 mg 3 times daily.  He remains on Norvasc 10 mg and carvedilol 25 mg twice daily.  His blood pressure is finally much better.  He was also found to have questionable neck lymphadenopathy and mediastinal mass as well as left upper lobe nodule.  He has been evaluated at [facility] and biopsy was done for the left upper lobe nodule.  He will follow up with them as outpatient.  CT soft tissue neck without contrast showed no mass or adenopathy in the neck.  Patient is asymptomatic.  He is cleared by nephrology.  He will be discharged in stable condition.  Discharge Diagnoses:  Principal Problem:   Hypertensive emergency Active Problems:   Anemia of chronic disease   Essential hypertension   Elevated troponin level not due myocardial infarction   ESRD (end stage renal disease) on dialysis (HCC)   Chronic combined systolic and diastolic congestive heart failure (HCC)   Mediastinal mass   Acute encephalopathy  Discharge Instructions   Allergies as of 11/03/2019      Reactions   Lisinopril Cough       Medication List    TAKE these medications   amLODipine 10 MG tablet Commonly known as: NORVASC Take 10 mg by mouth every evening.   carvedilol 25 MG tablet Commonly known as: COREG Take 1 tablet (25 mg total) by mouth 2 (two) times daily with a meal.   hydrALAZINE 100 MG tablet Commonly known as: APRESOLINE Take 1 tablet (100 mg total) by mouth 3 (three) times daily. What changed:   medication strength  how much to take  when to take this  Another medication with the same name was removed. Continue taking this medication, and follow the directions you see here.   multivitamin Tabs tablet Take 1 tablet by mouth daily.   polyvinyl alcohol 1.4 % ophthalmic solution Commonly known as: LIQUIFILM TEARS Place 1 drop into both eyes as needed for dry eyes.       Follow-up Information    [name], FNP Follow up in 1 week(s).   Specialty: Family Medicine Contact information: [place] [address] [phone]        Lelon Perla, MD .   Specialty: Cardiology Contact information: [address] [place] [address] [phone]              Allergies  Allergen Reactions  . Lisinopril Cough    Consultations: Neurology and nephrology   Procedures/Studies: CT Code Stroke CTA Head W/WO contrast  Addendum Date: 10/30/2019   ADDENDUM REPORT: 10/30/2019 21:59 ADDENDUM: In addition to the initially described findings, note is made of a 2.5 x 1.6 cm irregular nodule at the medial left upper lobe. This is described in the initial finding section of the report, but omitted from the impression by mistake. Further evaluation with dedicated cross-sectional imaging of the chest recommended for complete characterization. Underlying emphysema noted as well. Emphysema (ICD10-J43.9). Addended findings were communicated by telephone at the time of interpretation on 10/30/2019 at 9:56 pm to Dr. Curly Shores, who verbally acknowledged these  results. Electronically Signed   By: Jeannine Boga M.D.   On: 10/30/2019 21:59   Result Date: 10/30/2019 CLINICAL DATA:  Initial evaluation for acute aphasia. EXAM: CT ANGIOGRAPHY HEAD AND NECK CT PERFUSION BRAIN TECHNIQUE: Multidetector CT imaging of the head and neck was performed using the standard protocol during bolus administration of intravenous contrast. Multiplanar CT image reconstructions and MIPs were obtained to evaluate the vascular anatomy. Carotid stenosis measurements (when applicable) are obtained utilizing NASCET criteria, using the distal internal carotid diameter as the denominator. Multiphase CT imaging of the brain was performed following IV bolus contrast injection. Subsequent parametric perfusion maps were calculated using RAPID software. CONTRAST:  132mL OMNIPAQUE IOHEXOL 350 MG/ML SOLN COMPARISON:  Prior head CT from earlier the same day. FINDINGS: CTA NECK FINDINGS Aortic arch: Visualized aortic arch of normal caliber with normal branch pattern. Mild atheromatous change seen about the arch and origin of the great vessels without hemodynamically significant stenosis or other acute finding. Right carotid system: Right common and internal carotid arteries patent without stenosis, dissection, or occlusion. Mild atheromatous plaque about the right bifurcation without significant stenosis. Left carotid system: Left common and internal carotid arteries patent without stenosis, dissection or occlusion. Mild atheromatous change about the left bifurcation/proximal left ICA without hemodynamically significant stenosis. Vertebral arteries: Both vertebral arteries arise from the subclavian arteries. No significant proximal subclavian artery stenosis.  Left vertebral artery dominant with a diffusely hypoplastic right vertebral artery. Atheromatous plaque at the origin of the left vertebral artery with approximate 50% ostial stenosis. Vertebral arteries otherwise widely patent within the neck  without stenosis, dissection or occlusion. Skeleton: No visible acute osseous abnormality. No discrete lytic or blastic osseous lesions. Moderate cervical spondylosis noted at C6-7. Other neck: No other acute soft tissue abnormality within the neck. No mass lesion or adenopathy. Upper chest: Emphysema. Irregular nodular density measuring approximately 2.5 x 1.6 cm at the medial left upper lobe (series 20, image 178), indeterminate. Review of the MIP images confirms the above findings CTA HEAD FINDINGS Anterior circulation: Both internal carotid arteries patent to the termini without significant stenosis or other abnormality. Left A1 widely patent. Right A1 hypoplastic and/or absent, accounting for the diminutive right ICA is compared to the left. Normal anterior communicating artery. Both anterior cerebral arteries patent to their distal aspects without stenosis. No M1 stenosis or occlusion. Normal MCA bifurcations. No proximal M2 branch occlusion. Distal MCA branches well perfused and symmetric. Posterior circulation: Dominant left vertebral artery widely patent to the vertebrobasilar junction. Patent left PICA. Hypoplastic right vertebral artery terminates in PICA. Right PICA patent as well. Basilar widely patent to its distal aspect without stenosis. Superior cerebral arteries patent bilaterally. Both PCAs well perfused to their distal aspects without stenosis. Small bilateral posterior communicating arteries noted. Venous sinuses: Grossly patent allowing for timing the contrast bolus. Anatomic variants: Hypoplastic/absent right A1 segment. Hypoplastic right vertebral artery terminates in PICA. No aneurysm. Review of the MIP images confirms the above findings CT Brain Perfusion Findings: ASPECTS: 10. CBF (<30%) Volume: 109mL Perfusion (Tmax>6.0s) volume: 20mL Mismatch Volume: 33mL Infarction Location:Negative CT perfusion with no evidence for acute core infarct or other perfusion abnormality. IMPRESSION: CTA HEAD AND  NECK IMPRESSION: 1. Negative CTA of the head and neck for emergent large vessel occlusion. 2. Mild atheromatous change about the aortic arch and carotid bifurcations without hemodynamically significant stenosis. 3. 50% atheromatous stenosis at the origin of the left vertebral artery. Left vertebral artery dominant. Right vertebral artery diffusely hypoplastic and terminates in PICA. CT PERFUSION IMPRESSION: Negative CT perfusion. No evidence for core infarct or other perfusion abnormality. Critical Value/emergent results were called by telephone at the time of interpretation on 10/30/2019 at 6:35 pm to provider MCNEILL [facility] , who verbally acknowledged these results. Electronically Signed: By: Jeannine Boga M.D. On: 10/30/2019 19:04   DG Chest 1 View  Result Date: 10/30/2019 CLINICAL DATA:  Elevated blood pressure. EXAM: CHEST  1 VIEW COMPARISON:  April 26, 2019 FINDINGS: The study is limited secondary to patient rotation. Mild perihilar pulmonary vascular prominence is noted. There is no evidence of acute infiltrate, pleural effusion or pneumothorax. There is moderate severity enlargement of the cardiac silhouette. The visualized skeletal structures are unremarkable. IMPRESSION: Cardiomegaly with mild pulmonary vascular congestion. Electronically Signed   By: Virgina Norfolk M.D.   On: 10/30/2019 23:14   CT SOFT TISSUE NECK WO CONTRAST  Result Date: 11/01/2019 CLINICAL DATA:  Cervical lymphadenopathy. EXAM: CT NECK WITHOUT CONTRAST TECHNIQUE: Multidetector CT imaging of the neck was performed following the standard protocol without intravenous contrast. COMPARISON:  CT angio head and neck 10/30/2019 FINDINGS: Pharynx and larynx: Normal. No mass or swelling. Salivary glands: No inflammation, mass, or stone. Thyroid: Negative Lymph nodes: Evaluation of lymph nodes limited by lack of intravenous contrast and lack of body fat. Review of the prior CTA reveals no enlarged lymph nodes in the neck.  Vascular: Limited evaluation without intravenous contrast. Limited intracranial: Negative Visualized orbits: Negative Mastoids and visualized paranasal sinuses: Paranasal sinuses clear bilaterally. Mastoid clear bilaterally. Skeleton: Mild degenerative changes in the cervical spine. No acute abnormality. Upper chest: Bullous emphysema left upper lobe. 20 x 17 mm left upper lobe nodule unchanged from the recent study. This is low density on the prior study and could represent bronchial atresia with mucocele. Neoplasm not excluded. Other: None IMPRESSION: Negative for mass or adenopathy in the neck. Evaluation for lymph nodes is difficult without intravenous contrast however review of the recent CTA demonstrates no adenopathy. Left upper lobe nodule and adjacent bullous emphysema. 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 Recommend dedicated CT chest for further evaluation Electronically Signed   By: Franchot Gallo M.D.   On: 11/01/2019 16:31   CT Code Stroke CTA Neck W/WO contrast  Addendum Date: 10/30/2019   ADDENDUM REPORT: 10/30/2019 21:59 ADDENDUM: In addition to the initially described findings, note is made of a 2.5 x 1.6 cm irregular nodule at the medial left upper lobe. This is described in the initial finding section of the report, but omitted from the impression by mistake. Further evaluation with dedicated cross-sectional imaging of the chest recommended for complete characterization. Underlying emphysema noted as well. Emphysema (ICD10-J43.9). Addended findings were communicated by telephone at the time of interpretation on 10/30/2019 at 9:56 pm to Dr. Curly Shores, who verbally acknowledged these results. Electronically Signed   By: Jeannine Boga M.D.   On: 10/30/2019 21:59   Result Date: 10/30/2019 CLINICAL DATA:  Initial evaluation for acute aphasia. EXAM: CT ANGIOGRAPHY HEAD AND NECK CT PERFUSION BRAIN TECHNIQUE: Multidetector CT imaging of the head and neck was performed using the standard  protocol during bolus administration of intravenous contrast. Multiplanar CT image reconstructions and MIPs were obtained to evaluate the vascular anatomy. Carotid stenosis measurements (when applicable) are obtained utilizing NASCET criteria, using the distal internal carotid diameter as the denominator. Multiphase CT imaging of the brain was performed following IV bolus contrast injection. Subsequent parametric perfusion maps were calculated using RAPID software. CONTRAST:  179mL OMNIPAQUE IOHEXOL 350 MG/ML SOLN COMPARISON:  Prior head CT from earlier the same day. FINDINGS: CTA NECK FINDINGS Aortic arch: Visualized aortic arch of normal caliber with normal branch pattern. Mild atheromatous change seen about the arch and origin of the great vessels without hemodynamically significant stenosis or other acute finding. Right carotid system: Right common and internal carotid arteries patent without stenosis, dissection, or occlusion. Mild atheromatous plaque about the right bifurcation without significant stenosis. Left carotid system: Left common and internal carotid arteries patent without stenosis, dissection or occlusion. Mild atheromatous change about the left bifurcation/proximal left ICA without hemodynamically significant stenosis. Vertebral arteries: Both vertebral arteries arise from the subclavian arteries. No significant proximal subclavian artery stenosis. Left vertebral artery dominant with a diffusely hypoplastic right vertebral artery. Atheromatous plaque at the origin of the left vertebral artery with approximate 50% ostial stenosis. Vertebral arteries otherwise widely patent within the neck without stenosis, dissection or occlusion. Skeleton: No visible acute osseous abnormality. No discrete lytic or blastic osseous lesions. Moderate cervical spondylosis noted at C6-7. Other neck: No other acute soft tissue abnormality within the neck. No mass lesion or adenopathy. Upper chest: Emphysema. Irregular  nodular density measuring approximately 2.5 x 1.6 cm at the medial left upper lobe (series 20, image 178), indeterminate. Review of the MIP images confirms the above findings CTA HEAD FINDINGS Anterior circulation: Both internal carotid arteries patent to the termini without significant  stenosis or other abnormality. Left A1 widely patent. Right A1 hypoplastic and/or absent, accounting for the diminutive right ICA is compared to the left. Normal anterior communicating artery. Both anterior cerebral arteries patent to their distal aspects without stenosis. No M1 stenosis or occlusion. Normal MCA bifurcations. No proximal M2 branch occlusion. Distal MCA branches well perfused and symmetric. Posterior circulation: Dominant left vertebral artery widely patent to the vertebrobasilar junction. Patent left PICA. Hypoplastic right vertebral artery terminates in PICA. Right PICA patent as well. Basilar widely patent to its distal aspect without stenosis. Superior cerebral arteries patent bilaterally. Both PCAs well perfused to their distal aspects without stenosis. Small bilateral posterior communicating arteries noted. Venous sinuses: Grossly patent allowing for timing the contrast bolus. Anatomic variants: Hypoplastic/absent right A1 segment. Hypoplastic right vertebral artery terminates in PICA. No aneurysm. Review of the MIP images confirms the above findings CT Brain Perfusion Findings: ASPECTS: 10. CBF (<30%) Volume: 52mL Perfusion (Tmax>6.0s) volume: 58mL Mismatch Volume: 51mL Infarction Location:Negative CT perfusion with no evidence for acute core infarct or other perfusion abnormality. IMPRESSION: CTA HEAD AND NECK IMPRESSION: 1. Negative CTA of the head and neck for emergent large vessel occlusion. 2. Mild atheromatous change about the aortic arch and carotid bifurcations without hemodynamically significant stenosis. 3. 50% atheromatous stenosis at the origin of the left vertebral artery. Left vertebral artery  dominant. Right vertebral artery diffusely hypoplastic and terminates in PICA. CT PERFUSION IMPRESSION: Negative CT perfusion. No evidence for core infarct or other perfusion abnormality. Critical Value/emergent results were called by telephone at the time of interpretation on 10/30/2019 at 6:35 pm to provider MCNEILL [facility] , who verbally acknowledged these results. Electronically Signed: By: Jeannine Boga M.D. On: 10/30/2019 19:04   MR BRAIN WO CONTRAST  Result Date: 10/30/2019 CLINICAL DATA:  59 year old male code stroke presentation today. Aphasia. EXAM: MRI HEAD WITHOUT CONTRAST TECHNIQUE: Multiplanar, multiecho pulse sequences of the brain and surrounding structures were obtained without intravenous contrast. COMPARISON:  CT head, CTA and CTP today. FINDINGS: The examination had to be discontinued prior to completion due to patient inability to continue. Axial DWI and FLAIR only obtained. No restricted diffusion to suggest acute infarction. No intracranial mass effect. No ventriculomegaly. FLAIR images demonstrate patchy mild to moderate for age nonspecific bilateral cerebral white matter hyperintensity. No cortical encephalomalacia identified. FLAIR signal elsewhere within normal limits. However, trace DWI images suggest chronic microhemorrhage in the dorsal left thalamus (susceptibility on series 2, image 28), and perhaps also in the right cerebellum. IMPRESSION: 1. Truncated exam, negative for acute infarct. 2. Mild to moderate for age nonspecific white matter signal changes. Several chronic microhemorrhages suspected. Electronically Signed   By: Genevie Ann M.D.   On: 10/30/2019 19:15   CT CEREBRAL PERFUSION W CONTRAST  Addendum Date: 10/30/2019   ADDENDUM REPORT: 10/30/2019 21:59 ADDENDUM: In addition to the initially described findings, note is made of a 2.5 x 1.6 cm irregular nodule at the medial left upper lobe. This is described in the initial finding section of the report, but  omitted from the impression by mistake. Further evaluation with dedicated cross-sectional imaging of the chest recommended for complete characterization. Underlying emphysema noted as well. Emphysema (ICD10-J43.9). Addended findings were communicated by telephone at the time of interpretation on 10/30/2019 at 9:56 pm to Dr. Curly Shores, who verbally acknowledged these results. Electronically Signed   By: Jeannine Boga M.D.   On: 10/30/2019 21:59   Result Date: 10/30/2019 CLINICAL DATA:  Initial evaluation for acute aphasia. EXAM: CT ANGIOGRAPHY  HEAD AND NECK CT PERFUSION BRAIN TECHNIQUE: Multidetector CT imaging of the head and neck was performed using the standard protocol during bolus administration of intravenous contrast. Multiplanar CT image reconstructions and MIPs were obtained to evaluate the vascular anatomy. Carotid stenosis measurements (when applicable) are obtained utilizing NASCET criteria, using the distal internal carotid diameter as the denominator. Multiphase CT imaging of the brain was performed following IV bolus contrast injection. Subsequent parametric perfusion maps were calculated using RAPID software. CONTRAST:  126mL OMNIPAQUE IOHEXOL 350 MG/ML SOLN COMPARISON:  Prior head CT from earlier the same day. FINDINGS: CTA NECK FINDINGS Aortic arch: Visualized aortic arch of normal caliber with normal branch pattern. Mild atheromatous change seen about the arch and origin of the great vessels without hemodynamically significant stenosis or other acute finding. Right carotid system: Right common and internal carotid arteries patent without stenosis, dissection, or occlusion. Mild atheromatous plaque about the right bifurcation without significant stenosis. Left carotid system: Left common and internal carotid arteries patent without stenosis, dissection or occlusion. Mild atheromatous change about the left bifurcation/proximal left ICA without hemodynamically significant stenosis. Vertebral  arteries: Both vertebral arteries arise from the subclavian arteries. No significant proximal subclavian artery stenosis. Left vertebral artery dominant with a diffusely hypoplastic right vertebral artery. Atheromatous plaque at the origin of the left vertebral artery with approximate 50% ostial stenosis. Vertebral arteries otherwise widely patent within the neck without stenosis, dissection or occlusion. Skeleton: No visible acute osseous abnormality. No discrete lytic or blastic osseous lesions. Moderate cervical spondylosis noted at C6-7. Other neck: No other acute soft tissue abnormality within the neck. No mass lesion or adenopathy. Upper chest: Emphysema. Irregular nodular density measuring approximately 2.5 x 1.6 cm at the medial left upper lobe (series 20, image 178), indeterminate. Review of the MIP images confirms the above findings CTA HEAD FINDINGS Anterior circulation: Both internal carotid arteries patent to the termini without significant stenosis or other abnormality. Left A1 widely patent. Right A1 hypoplastic and/or absent, accounting for the diminutive right ICA is compared to the left. Normal anterior communicating artery. Both anterior cerebral arteries patent to their distal aspects without stenosis. No M1 stenosis or occlusion. Normal MCA bifurcations. No proximal M2 branch occlusion. Distal MCA branches well perfused and symmetric. Posterior circulation: Dominant left vertebral artery widely patent to the vertebrobasilar junction. Patent left PICA. Hypoplastic right vertebral artery terminates in PICA. Right PICA patent as well. Basilar widely patent to its distal aspect without stenosis. Superior cerebral arteries patent bilaterally. Both PCAs well perfused to their distal aspects without stenosis. Small bilateral posterior communicating arteries noted. Venous sinuses: Grossly patent allowing for timing the contrast bolus. Anatomic variants: Hypoplastic/absent right A1 segment. Hypoplastic  right vertebral artery terminates in PICA. No aneurysm. Review of the MIP images confirms the above findings CT Brain Perfusion Findings: ASPECTS: 10. CBF (<30%) Volume: 88mL Perfusion (Tmax>6.0s) volume: 25mL Mismatch Volume: 48mL Infarction Location:Negative CT perfusion with no evidence for acute core infarct or other perfusion abnormality. IMPRESSION: CTA HEAD AND NECK IMPRESSION: 1. Negative CTA of the head and neck for emergent large vessel occlusion. 2. Mild atheromatous change about the aortic arch and carotid bifurcations without hemodynamically significant stenosis. 3. 50% atheromatous stenosis at the origin of the left vertebral artery. Left vertebral artery dominant. Right vertebral artery diffusely hypoplastic and terminates in PICA. CT PERFUSION IMPRESSION: Negative CT perfusion. No evidence for core infarct or other perfusion abnormality. Critical Value/emergent results were called by telephone at the time of interpretation on 10/30/2019 at 6:35  pm to provider [facility] , who verbally acknowledged these results. Electronically Signed: By: Jeannine Boga M.D. On: 10/30/2019 19:04   EEG adult  Result Date: 10/30/2019 Greta Doom, MD     10/30/2019  9:10 PM History: 59 year old male presenting with aphasia Sedation: 2 mg Ativan given few hours prior to EEG Technique: This is a 21 channel routine scalp EEG performed at the bedside with bipolar and monopolar montages arranged in accordance to the international 10/20 system of electrode placement. One channel was dedicated to EKG recording. Background: There is a posterior dominant rhythm of 7 to 8 Hz which is seen bilaterally.  In addition, there is admixed generalized irregular delta and theta range activities.  Clear sleep structures are not recorded. Photic stimulation: Physiologic driving is now performed EEG Abnormalities: 1) generalized irregular slow activity 2) slow posterior dominant rhythm Clinical Interpretation:  This EEG is consistent with a generalized nonspecific cerebral dysfunction (encephalopathy). There was no seizure or seizure predisposition recorded on this study. Please note that lack of epileptiform activity on EEG does not preclude the possibility of epilepsy. Roland Rack, MD [facility] [phone] If 7pm- 7am, please page neurology on call as listed in [place].   CT HEAD CODE STROKE WO CONTRAST  Result Date: 10/30/2019 CLINICAL DATA:  Code stroke.  Aphasia EXAM: CT HEAD WITHOUT CONTRAST TECHNIQUE: Contiguous axial images were obtained from the base of the skull through the vertex without intravenous contrast. COMPARISON:  02/17/2019 FINDINGS: Brain: Normal appearance without evidence of atrophy, old or acute infarction, mass lesion, hemorrhage, hydrocephalus or extra-axial collection. Vascular: There is atherosclerotic calcification of the major vessels at the base of the brain. Skull: Negative Sinuses/Orbits: Clear/normal Other: None ASPECTS ([place] Stroke Program Early CT Score) - Ganglionic level infarction (caudate, lentiform nuclei, internal capsule, insula, M1-M3 cortex): 7 - Supraganglionic infarction (M4-M6 cortex): 3 Total score (0-10 with 10 being normal): 10 IMPRESSION: 1. Normal head CT for age. Atherosclerotic calcification of the major vessels at the base of the brain. 2. ASPECTS is 10. 3. These results were communicated to Dr. Leonel Ramsay at 5:31 pmon 10/19/2021by text page via the [facility] messaging system. Electronically Signed   By: Nelson Chimes M.D.   On: 10/30/2019 17:32      Discharge Exam: Vitals:   11/03/19 0800 11/03/19 1123  BP: (!) 170/92 139/84  Pulse: 64   Resp:    Temp:    SpO2:     Vitals:   11/03/19 0559 11/03/19 0739 11/03/19 0800 11/03/19 1123  BP: (!) 193/99  (!) 170/92 139/84  Pulse:   64   Resp:      Temp:  98.2 F (36.8 C)    TempSrc:  Oral    SpO2:  93%    Weight:      Height:        General: Jason [name] is alert, awake, not in acute  distress Cardiovascular: RRR, S1/S2 +, no rubs, no gallops Respiratory: CTA bilaterally, no wheezing, no rhonchi Abdominal: Soft, NT, ND, bowel sounds + Extremities: no edema, no cyanosis    The results of significant diagnostics from this hospitalization (including imaging, microbiology, ancillary and laboratory) are listed below for reference.     Microbiology: Recent Results (from the past 240 hour(s))  Resp Panel by RT PCR (RSV, Flu A&B, Covid) - Nasopharyngeal Swab     Status: None   Collection Time: 10/30/19  7:42 PM   Specimen: Nasopharyngeal Swab  Result Value Ref Range Status   SARS Coronavirus 2  by RT PCR NEGATIVE NEGATIVE Final    Comment: (NOTE) SARS-CoV-2 target nucleic acids are NOT DETECTED.  The SARS-CoV-2 RNA is generally detectable in upper respiratoy specimens during the acute phase of infection. The lowest concentration of SARS-CoV-2 viral copies this assay can detect is 131 copies/mL. A negative result does not preclude SARS-Cov-2 infection and should not be used as the sole basis for treatment or other patient management decisions. A negative result may occur with  improper specimen collection/handling, submission of specimen other than nasopharyngeal swab, presence of viral mutation(s) within the areas targeted by this assay, and inadequate number of viral copies (<131 copies/mL). A negative result must be combined with clinical observations, patient history, and epidemiological information. The expected result is Negative.  Fact Sheet for Patients:  PinkCheek.be  Fact Sheet for Healthcare Providers:  GravelBags.it  This test is no t yet approved or cleared by the [place] FDA and  has been authorized for detection and/or diagnosis of SARS-CoV-2 by FDA under an Emergency Use Authorization (EUA). This EUA will remain  in effect (meaning this test can be used) for the duration of  the COVID-19 declaration under Section 564(b)(1) of the Act, 21 U.S.C. section 360bbb-3(b)(1), unless the authorization is terminated or revoked sooner.     Influenza A by PCR NEGATIVE NEGATIVE Final   Influenza B by PCR NEGATIVE NEGATIVE Final    Comment: (NOTE) The Xpert Xpress SARS-CoV-2/FLU/RSV assay is intended as an aid in  the diagnosis of influenza from Nasopharyngeal swab specimens and  should not be used as a sole basis for treatment. Nasal washings and  aspirates are unacceptable for Xpert Xpress SARS-CoV-2/FLU/RSV  testing.  Fact Sheet for Patients: PinkCheek.be  Fact Sheet for Healthcare Providers: GravelBags.it  This test is not yet approved or cleared by the [place] FDA and  has been authorized for detection and/or diagnosis of SARS-CoV-2 by  FDA under an Emergency Use Authorization (EUA). This EUA will remain  in effect (meaning this test can be used) for the duration of the  Covid-19 declaration under Section 564(b)(1) of the Act, 21  U.S.C. section 360bbb-3(b)(1), unless the authorization is  terminated or revoked.    Respiratory Syncytial Virus by PCR NEGATIVE NEGATIVE Final    Comment: (NOTE) Fact Sheet for Patients: PinkCheek.be  Fact Sheet for Healthcare Providers: GravelBags.it  This test is not yet approved or cleared by the [place] FDA and  has been authorized for detection and/or diagnosis of SARS-CoV-2 by  FDA under an Emergency Use Authorization (EUA). This EUA will remain  in effect (meaning this test can be used) for the duration of the  COVID-19 declaration under Section 564(b)(1) of the Act, 21 U.S.C.  section 360bbb-3(b)(1), unless the authorization is terminated or  revoked. Performed at [facility], [place] [address], [place], [place] [address]   MRSA PCR Screening     Status: None   Collection Time:  10/31/19 11:35 PM   Specimen: Nasal Mucosa; Nasopharyngeal  Result Value Ref Range Status   MRSA by PCR NEGATIVE NEGATIVE Final    Comment:        The GeneXpert MRSA Assay (FDA approved for NASAL specimens only), is one component of a comprehensive MRSA colonization surveillance program. It is not intended to diagnose MRSA infection nor to guide or monitor treatment for MRSA infections. Performed at [facility], [place] [address], [place], [place] [address]      Labs: BNP (last 3 results) No results  for input(s): BNP in the last 8760 hours. Basic Metabolic Panel: Recent Labs  Lab 10/30/19 1714 10/30/19 1714 10/30/19 1719 10/31/19 0230 10/31/19 0442 11/01/19 0054 11/02/19 0105  NA 138   < > 136 134* 136 135 134*  K 3.9   < > 3.8 5.0 5.0 5.4* 4.5  CL 95*  --  97* 94*  --  95* 94*  CO2 26  --   --  27  --  23 28  GLUCOSE 88  --  86 91  --  87 120*  BUN 50*  --  51* 59*  --  75* 43*  CREATININE 8.65*  --  9.30* 10.27*  --  12.69* 8.70*  CALCIUM 10.6*  --   --  9.5  --  9.0 9.1   < > = values in this interval not displayed.   Liver Function Tests: Recent Labs  Lab 10/30/19 1714 10/31/19 0230  AST 16 18  ALT 17 15  ALKPHOS 32* 30*  BILITOT 1.5* 1.0  PROT 7.5 6.8  ALBUMIN 4.2 3.7   No results for input(s): LIPASE, AMYLASE in the last 168 hours. Recent Labs  Lab 10/30/19 2030  AMMONIA 37*   CBC: Recent Labs  Lab 10/30/19 1714 10/30/19 1719 10/31/19 0441 10/31/19 0442 11/01/19 0807  WBC 3.8*  --  3.9*  --  5.4  NEUTROABS 2.8  --  2.5  --   --   HGB 10.4* 10.5* 10.2* 10.9* 10.1*  HCT 31.3* 31.0* 31.4* 32.0* 28.6*  MCV 89.9  --  93.7  --  89.9  PLT 186  --  181  --  196   Cardiac Enzymes: No results for input(s): CKTOTAL, CKMB, CKMBINDEX, TROPONINI in the last 168 hours. BNP: Invalid input(s): POCBNP CBG: No results for input(s): GLUCAP in the last 168 hours. D-Dimer No results for input(s): DDIMER in the last 72 hours. Hgb A1c No results  for input(s): HGBA1C in the last 72 hours. Lipid Profile No results for input(s): CHOL, HDL, LDLCALC, TRIG, CHOLHDL, LDLDIRECT in the last 72 hours. Thyroid function studies No results for input(s): TSH, T4TOTAL, T3FREE, THYROIDAB in the last 72 hours.  Invalid input(s): FREET3 Anemia work up No results for input(s): VITAMINB12, FOLATE, FERRITIN, TIBC, IRON, RETICCTPCT in the last 72 hours. Urinalysis    Component Value Date/Time   COLORURINE YELLOW 10/30/2019 2335   APPEARANCEUR HAZY (A) 10/30/2019 2335   LABSPEC 1.013 10/30/2019 2335   PHURINE 7.0 10/30/2019 2335   GLUCOSEU 50 (A) 10/30/2019 2335   HGBUR NEGATIVE 10/30/2019 2335   BILIRUBINUR NEGATIVE 10/30/2019 2335   BILIRUBINUR Negative 03/19/2019 1354   KETONESUR 5 (A) 10/30/2019 2335   PROTEINUR >=300 (A) 10/30/2019 2335   UROBILINOGEN 0.2 03/19/2019 1354   UROBILINOGEN 0.2 02/26/2015 1028   NITRITE NEGATIVE 10/30/2019 2335   LEUKOCYTESUR NEGATIVE 10/30/2019 2335   Sepsis Labs Invalid input(s): PROCALCITONIN,  WBC,  LACTICIDVEN Microbiology Recent Results (from the past 240 hour(s))  Resp Panel by RT PCR (RSV, Flu A&B, Covid) - Nasopharyngeal Swab     Status: None   Collection Time: 10/30/19  7:42 PM   Specimen: Nasopharyngeal Swab  Result Value Ref Range Status   SARS Coronavirus 2 by RT PCR NEGATIVE NEGATIVE Final    Comment: (NOTE) SARS-CoV-2 target nucleic acids are NOT DETECTED.  The SARS-CoV-2 RNA is generally detectable in upper respiratoy specimens during the acute phase of infection. The lowest concentration of SARS-CoV-2 viral copies this assay can detect is 131 copies/mL. A  negative result does not preclude SARS-Cov-2 infection and should not be used as the sole basis for treatment or other patient management decisions. A negative result may occur with  improper specimen collection/handling, submission of specimen other than nasopharyngeal swab, presence of viral mutation(s) within the areas targeted  by this assay, and inadequate number of viral copies (<131 copies/mL). A negative result must be combined with clinical observations, patient history, and epidemiological information. The expected result is Negative.  Fact Sheet for Patients:  PinkCheek.be  Fact Sheet for Healthcare Providers:  GravelBags.it  This test is no t yet approved or cleared by the [place] FDA and  has been authorized for detection and/or diagnosis of SARS-CoV-2 by FDA under an Emergency Use Authorization (EUA). This EUA will remain  in effect (meaning this test can be used) for the duration of the COVID-19 declaration under Section 564(b)(1) of the Act, 21 U.S.C. section 360bbb-3(b)(1), unless the authorization is terminated or revoked sooner.     Influenza A by PCR NEGATIVE NEGATIVE Final   Influenza B by PCR NEGATIVE NEGATIVE Final    Comment: (NOTE) The Xpert Xpress SARS-CoV-2/FLU/RSV assay is intended as an aid in  the diagnosis of influenza from Nasopharyngeal swab specimens and  should not be used as a sole basis for treatment. Nasal washings and  aspirates are unacceptable for Xpert Xpress SARS-CoV-2/FLU/RSV  testing.  Fact Sheet for Patients: PinkCheek.be  Fact Sheet for Healthcare Providers: GravelBags.it  This test is not yet approved or cleared by the [place] FDA and  has been authorized for detection and/or diagnosis of SARS-CoV-2 by  FDA under an Emergency Use Authorization (EUA). This EUA will remain  in effect (meaning this test can be used) for the duration of the  Covid-19 declaration under Section 564(b)(1) of the Act, 21  U.S.C. section 360bbb-3(b)(1), unless the authorization is  terminated or revoked.    Respiratory Syncytial Virus by PCR NEGATIVE NEGATIVE Final    Comment: (NOTE) Fact Sheet for  Patients: PinkCheek.be  Fact Sheet for Healthcare Providers: GravelBags.it  This test is not yet approved or cleared by the [place] FDA and  has been authorized for detection and/or diagnosis of SARS-CoV-2 by  FDA under an Emergency Use Authorization (EUA). This EUA will remain  in effect (meaning this test can be used) for the duration of the  COVID-19 declaration under Section 564(b)(1) of the Act, 21 U.S.C.  section 360bbb-3(b)(1), unless the authorization is terminated or  revoked. Performed at [facility], [place] [address] [place], [place] [address]   MRSA PCR Screening     Status: None   Collection Time: 10/31/19 11:35 PM   Specimen: Nasal Mucosa; Nasopharyngeal  Result Value Ref Range Status   MRSA by PCR NEGATIVE NEGATIVE Final    Comment:        The GeneXpert MRSA Assay (FDA approved for NASAL specimens only), is one component of a comprehensive MRSA colonization surveillance program. It is not intended to diagnose MRSA infection nor to guide or monitor treatment for MRSA infections. Performed at [facility], [place] [address], [place], [place] [address]      Time coordinating discharge: Over 30 minutes  SIGNED:   Darliss Cheney, MD  [facility] 11/03/2019, 11:37 AM  If 7PM-7AM, please contact night-coverage [email or web address]

## 2019-11-03 NOTE — Progress Notes (Signed)
EKG obtained. Spoke to Dialysis and they are able to draw labs because the patient is already off the floor.

## 2019-11-04 ENCOUNTER — Telehealth: Payer: Self-pay | Admitting: Nephrology

## 2019-11-04 NOTE — Telephone Encounter (Signed)
Transition of care contact from inpatient facility  Date of Discharge: 11/03/19 Date of Contact: 11/04/19 Method of contact: phone Talked with: patient  Patient contact to discuss transition of care from recent inpatient hospitalization. Pateint was admitted to [facility] from : 19/19 - 11/03/19 with the diagnosis of hypertensive emergency, AMS, possible PRES  Medication changes were reviewed.  Advised to adjust his hydralazine to = 100 tid until he could get to the drug store to pick up his new dose  Patient will follow up at outpatient dialysis on 10/26  Other follow up needs: none  Amalia Hailey, PA-C [facility] Pager:  [phone]

## 2019-11-06 DIAGNOSIS — E8770 Fluid overload, unspecified: Secondary | ICD-10-CM | POA: Diagnosis not present

## 2019-11-06 DIAGNOSIS — N186 End stage renal disease: Secondary | ICD-10-CM | POA: Diagnosis not present

## 2019-11-06 DIAGNOSIS — I160 Hypertensive urgency: Secondary | ICD-10-CM | POA: Diagnosis not present

## 2019-11-06 DIAGNOSIS — R4182 Altered mental status, unspecified: Secondary | ICD-10-CM | POA: Diagnosis not present

## 2019-11-06 DIAGNOSIS — D631 Anemia in chronic kidney disease: Secondary | ICD-10-CM | POA: Diagnosis not present

## 2019-11-06 DIAGNOSIS — D509 Iron deficiency anemia, unspecified: Secondary | ICD-10-CM | POA: Diagnosis not present

## 2019-11-06 DIAGNOSIS — N2581 Secondary hyperparathyroidism of renal origin: Secondary | ICD-10-CM | POA: Diagnosis not present

## 2019-11-06 DIAGNOSIS — Z992 Dependence on renal dialysis: Secondary | ICD-10-CM | POA: Diagnosis not present

## 2019-11-08 DIAGNOSIS — N2581 Secondary hyperparathyroidism of renal origin: Secondary | ICD-10-CM | POA: Diagnosis not present

## 2019-11-08 DIAGNOSIS — D631 Anemia in chronic kidney disease: Secondary | ICD-10-CM | POA: Diagnosis not present

## 2019-11-08 DIAGNOSIS — Z992 Dependence on renal dialysis: Secondary | ICD-10-CM | POA: Diagnosis not present

## 2019-11-08 DIAGNOSIS — N186 End stage renal disease: Secondary | ICD-10-CM | POA: Diagnosis not present

## 2019-11-08 DIAGNOSIS — D509 Iron deficiency anemia, unspecified: Secondary | ICD-10-CM | POA: Diagnosis not present

## 2019-11-10 DIAGNOSIS — D509 Iron deficiency anemia, unspecified: Secondary | ICD-10-CM | POA: Diagnosis not present

## 2019-11-10 DIAGNOSIS — D631 Anemia in chronic kidney disease: Secondary | ICD-10-CM | POA: Diagnosis not present

## 2019-11-10 DIAGNOSIS — Z992 Dependence on renal dialysis: Secondary | ICD-10-CM | POA: Diagnosis not present

## 2019-11-10 DIAGNOSIS — N186 End stage renal disease: Secondary | ICD-10-CM | POA: Diagnosis not present

## 2019-11-10 DIAGNOSIS — N2581 Secondary hyperparathyroidism of renal origin: Secondary | ICD-10-CM | POA: Diagnosis not present

## 2019-11-12 DIAGNOSIS — N186 End stage renal disease: Secondary | ICD-10-CM | POA: Diagnosis not present

## 2019-11-12 DIAGNOSIS — I129 Hypertensive chronic kidney disease with stage 1 through stage 4 chronic kidney disease, or unspecified chronic kidney disease: Secondary | ICD-10-CM | POA: Diagnosis not present

## 2019-11-12 DIAGNOSIS — Z992 Dependence on renal dialysis: Secondary | ICD-10-CM | POA: Diagnosis not present

## 2019-11-13 DIAGNOSIS — N186 End stage renal disease: Secondary | ICD-10-CM | POA: Diagnosis not present

## 2019-11-13 DIAGNOSIS — Z992 Dependence on renal dialysis: Secondary | ICD-10-CM | POA: Diagnosis not present

## 2019-11-13 DIAGNOSIS — N2581 Secondary hyperparathyroidism of renal origin: Secondary | ICD-10-CM | POA: Diagnosis not present

## 2019-11-13 DIAGNOSIS — R0602 Shortness of breath: Secondary | ICD-10-CM | POA: Diagnosis not present

## 2019-11-13 DIAGNOSIS — D631 Anemia in chronic kidney disease: Secondary | ICD-10-CM | POA: Diagnosis not present

## 2019-11-13 DIAGNOSIS — D509 Iron deficiency anemia, unspecified: Secondary | ICD-10-CM | POA: Diagnosis not present

## 2019-11-15 DIAGNOSIS — D631 Anemia in chronic kidney disease: Secondary | ICD-10-CM | POA: Diagnosis not present

## 2019-11-15 DIAGNOSIS — N186 End stage renal disease: Secondary | ICD-10-CM | POA: Diagnosis not present

## 2019-11-15 DIAGNOSIS — D509 Iron deficiency anemia, unspecified: Secondary | ICD-10-CM | POA: Diagnosis not present

## 2019-11-15 DIAGNOSIS — Z992 Dependence on renal dialysis: Secondary | ICD-10-CM | POA: Diagnosis not present

## 2019-11-15 DIAGNOSIS — N2581 Secondary hyperparathyroidism of renal origin: Secondary | ICD-10-CM | POA: Diagnosis not present

## 2019-11-20 DIAGNOSIS — N186 End stage renal disease: Secondary | ICD-10-CM | POA: Diagnosis not present

## 2019-11-20 DIAGNOSIS — N2581 Secondary hyperparathyroidism of renal origin: Secondary | ICD-10-CM | POA: Diagnosis not present

## 2019-11-20 DIAGNOSIS — D509 Iron deficiency anemia, unspecified: Secondary | ICD-10-CM | POA: Diagnosis not present

## 2019-11-20 DIAGNOSIS — D631 Anemia in chronic kidney disease: Secondary | ICD-10-CM | POA: Diagnosis not present

## 2019-11-20 DIAGNOSIS — Z992 Dependence on renal dialysis: Secondary | ICD-10-CM | POA: Diagnosis not present

## 2019-11-24 DIAGNOSIS — Z992 Dependence on renal dialysis: Secondary | ICD-10-CM | POA: Diagnosis not present

## 2019-11-24 DIAGNOSIS — D509 Iron deficiency anemia, unspecified: Secondary | ICD-10-CM | POA: Diagnosis not present

## 2019-11-24 DIAGNOSIS — N186 End stage renal disease: Secondary | ICD-10-CM | POA: Diagnosis not present

## 2019-11-24 DIAGNOSIS — D631 Anemia in chronic kidney disease: Secondary | ICD-10-CM | POA: Diagnosis not present

## 2019-11-24 DIAGNOSIS — N2581 Secondary hyperparathyroidism of renal origin: Secondary | ICD-10-CM | POA: Diagnosis not present

## 2019-11-27 DIAGNOSIS — N2581 Secondary hyperparathyroidism of renal origin: Secondary | ICD-10-CM | POA: Diagnosis not present

## 2019-11-27 DIAGNOSIS — D509 Iron deficiency anemia, unspecified: Secondary | ICD-10-CM | POA: Diagnosis not present

## 2019-11-27 DIAGNOSIS — N186 End stage renal disease: Secondary | ICD-10-CM | POA: Diagnosis not present

## 2019-11-27 DIAGNOSIS — D631 Anemia in chronic kidney disease: Secondary | ICD-10-CM | POA: Diagnosis not present

## 2019-11-27 DIAGNOSIS — Z992 Dependence on renal dialysis: Secondary | ICD-10-CM | POA: Diagnosis not present

## 2019-11-29 DIAGNOSIS — D509 Iron deficiency anemia, unspecified: Secondary | ICD-10-CM | POA: Diagnosis not present

## 2019-11-29 DIAGNOSIS — N186 End stage renal disease: Secondary | ICD-10-CM | POA: Diagnosis not present

## 2019-11-29 DIAGNOSIS — N2581 Secondary hyperparathyroidism of renal origin: Secondary | ICD-10-CM | POA: Diagnosis not present

## 2019-11-29 DIAGNOSIS — D631 Anemia in chronic kidney disease: Secondary | ICD-10-CM | POA: Diagnosis not present

## 2019-11-29 DIAGNOSIS — Z992 Dependence on renal dialysis: Secondary | ICD-10-CM | POA: Diagnosis not present

## 2019-12-01 DIAGNOSIS — D509 Iron deficiency anemia, unspecified: Secondary | ICD-10-CM | POA: Diagnosis not present

## 2019-12-01 DIAGNOSIS — D631 Anemia in chronic kidney disease: Secondary | ICD-10-CM | POA: Diagnosis not present

## 2019-12-01 DIAGNOSIS — N2581 Secondary hyperparathyroidism of renal origin: Secondary | ICD-10-CM | POA: Diagnosis not present

## 2019-12-01 DIAGNOSIS — Z992 Dependence on renal dialysis: Secondary | ICD-10-CM | POA: Diagnosis not present

## 2019-12-01 DIAGNOSIS — N186 End stage renal disease: Secondary | ICD-10-CM | POA: Diagnosis not present

## 2019-12-08 DIAGNOSIS — D631 Anemia in chronic kidney disease: Secondary | ICD-10-CM | POA: Diagnosis not present

## 2019-12-08 DIAGNOSIS — N186 End stage renal disease: Secondary | ICD-10-CM | POA: Diagnosis not present

## 2019-12-08 DIAGNOSIS — N2581 Secondary hyperparathyroidism of renal origin: Secondary | ICD-10-CM | POA: Diagnosis not present

## 2019-12-08 DIAGNOSIS — D509 Iron deficiency anemia, unspecified: Secondary | ICD-10-CM | POA: Diagnosis not present

## 2019-12-08 DIAGNOSIS — Z992 Dependence on renal dialysis: Secondary | ICD-10-CM | POA: Diagnosis not present

## 2019-12-10 DIAGNOSIS — D509 Iron deficiency anemia, unspecified: Secondary | ICD-10-CM | POA: Diagnosis not present

## 2019-12-10 DIAGNOSIS — N2581 Secondary hyperparathyroidism of renal origin: Secondary | ICD-10-CM | POA: Diagnosis not present

## 2019-12-10 DIAGNOSIS — D631 Anemia in chronic kidney disease: Secondary | ICD-10-CM | POA: Diagnosis not present

## 2019-12-10 DIAGNOSIS — N186 End stage renal disease: Secondary | ICD-10-CM | POA: Diagnosis not present

## 2019-12-10 DIAGNOSIS — Z992 Dependence on renal dialysis: Secondary | ICD-10-CM | POA: Diagnosis not present

## 2019-12-12 DIAGNOSIS — Z992 Dependence on renal dialysis: Secondary | ICD-10-CM | POA: Diagnosis not present

## 2019-12-12 DIAGNOSIS — I129 Hypertensive chronic kidney disease with stage 1 through stage 4 chronic kidney disease, or unspecified chronic kidney disease: Secondary | ICD-10-CM | POA: Diagnosis not present

## 2019-12-12 DIAGNOSIS — N186 End stage renal disease: Secondary | ICD-10-CM | POA: Diagnosis not present

## 2019-12-13 DIAGNOSIS — D631 Anemia in chronic kidney disease: Secondary | ICD-10-CM | POA: Diagnosis not present

## 2019-12-13 DIAGNOSIS — N186 End stage renal disease: Secondary | ICD-10-CM | POA: Diagnosis not present

## 2019-12-13 DIAGNOSIS — Z992 Dependence on renal dialysis: Secondary | ICD-10-CM | POA: Diagnosis not present

## 2019-12-13 DIAGNOSIS — N2581 Secondary hyperparathyroidism of renal origin: Secondary | ICD-10-CM | POA: Diagnosis not present

## 2019-12-13 DIAGNOSIS — D509 Iron deficiency anemia, unspecified: Secondary | ICD-10-CM | POA: Diagnosis not present

## 2019-12-18 DIAGNOSIS — Z992 Dependence on renal dialysis: Secondary | ICD-10-CM | POA: Diagnosis not present

## 2019-12-18 DIAGNOSIS — N186 End stage renal disease: Secondary | ICD-10-CM | POA: Diagnosis not present

## 2019-12-18 DIAGNOSIS — N2581 Secondary hyperparathyroidism of renal origin: Secondary | ICD-10-CM | POA: Diagnosis not present

## 2019-12-18 DIAGNOSIS — D631 Anemia in chronic kidney disease: Secondary | ICD-10-CM | POA: Diagnosis not present

## 2019-12-18 DIAGNOSIS — D509 Iron deficiency anemia, unspecified: Secondary | ICD-10-CM | POA: Diagnosis not present

## 2019-12-20 DIAGNOSIS — N186 End stage renal disease: Secondary | ICD-10-CM | POA: Diagnosis not present

## 2019-12-20 DIAGNOSIS — D631 Anemia in chronic kidney disease: Secondary | ICD-10-CM | POA: Diagnosis not present

## 2019-12-20 DIAGNOSIS — D509 Iron deficiency anemia, unspecified: Secondary | ICD-10-CM | POA: Diagnosis not present

## 2019-12-20 DIAGNOSIS — N2581 Secondary hyperparathyroidism of renal origin: Secondary | ICD-10-CM | POA: Diagnosis not present

## 2019-12-20 DIAGNOSIS — Z992 Dependence on renal dialysis: Secondary | ICD-10-CM | POA: Diagnosis not present

## 2019-12-22 DIAGNOSIS — Z992 Dependence on renal dialysis: Secondary | ICD-10-CM | POA: Diagnosis not present

## 2019-12-22 DIAGNOSIS — N186 End stage renal disease: Secondary | ICD-10-CM | POA: Diagnosis not present

## 2019-12-22 DIAGNOSIS — D509 Iron deficiency anemia, unspecified: Secondary | ICD-10-CM | POA: Diagnosis not present

## 2019-12-22 DIAGNOSIS — D631 Anemia in chronic kidney disease: Secondary | ICD-10-CM | POA: Diagnosis not present

## 2019-12-22 DIAGNOSIS — N2581 Secondary hyperparathyroidism of renal origin: Secondary | ICD-10-CM | POA: Diagnosis not present

## 2019-12-25 DIAGNOSIS — Z992 Dependence on renal dialysis: Secondary | ICD-10-CM | POA: Diagnosis not present

## 2019-12-25 DIAGNOSIS — N186 End stage renal disease: Secondary | ICD-10-CM | POA: Diagnosis not present

## 2019-12-25 DIAGNOSIS — D631 Anemia in chronic kidney disease: Secondary | ICD-10-CM | POA: Diagnosis not present

## 2019-12-25 DIAGNOSIS — D509 Iron deficiency anemia, unspecified: Secondary | ICD-10-CM | POA: Diagnosis not present

## 2019-12-25 DIAGNOSIS — N2581 Secondary hyperparathyroidism of renal origin: Secondary | ICD-10-CM | POA: Diagnosis not present

## 2019-12-27 DIAGNOSIS — Z992 Dependence on renal dialysis: Secondary | ICD-10-CM | POA: Diagnosis not present

## 2019-12-27 DIAGNOSIS — N186 End stage renal disease: Secondary | ICD-10-CM | POA: Diagnosis not present

## 2019-12-27 DIAGNOSIS — D631 Anemia in chronic kidney disease: Secondary | ICD-10-CM | POA: Diagnosis not present

## 2019-12-27 DIAGNOSIS — N2581 Secondary hyperparathyroidism of renal origin: Secondary | ICD-10-CM | POA: Diagnosis not present

## 2019-12-27 DIAGNOSIS — D509 Iron deficiency anemia, unspecified: Secondary | ICD-10-CM | POA: Diagnosis not present

## 2019-12-29 DIAGNOSIS — D509 Iron deficiency anemia, unspecified: Secondary | ICD-10-CM | POA: Diagnosis not present

## 2019-12-29 DIAGNOSIS — N2581 Secondary hyperparathyroidism of renal origin: Secondary | ICD-10-CM | POA: Diagnosis not present

## 2019-12-29 DIAGNOSIS — Z992 Dependence on renal dialysis: Secondary | ICD-10-CM | POA: Diagnosis not present

## 2019-12-29 DIAGNOSIS — D631 Anemia in chronic kidney disease: Secondary | ICD-10-CM | POA: Diagnosis not present

## 2019-12-29 DIAGNOSIS — N186 End stage renal disease: Secondary | ICD-10-CM | POA: Diagnosis not present

## 2020-01-01 DIAGNOSIS — D509 Iron deficiency anemia, unspecified: Secondary | ICD-10-CM | POA: Diagnosis not present

## 2020-01-01 DIAGNOSIS — D631 Anemia in chronic kidney disease: Secondary | ICD-10-CM | POA: Diagnosis not present

## 2020-01-01 DIAGNOSIS — Z992 Dependence on renal dialysis: Secondary | ICD-10-CM | POA: Diagnosis not present

## 2020-01-01 DIAGNOSIS — N2581 Secondary hyperparathyroidism of renal origin: Secondary | ICD-10-CM | POA: Diagnosis not present

## 2020-01-01 DIAGNOSIS — N186 End stage renal disease: Secondary | ICD-10-CM | POA: Diagnosis not present

## 2020-01-03 DIAGNOSIS — D509 Iron deficiency anemia, unspecified: Secondary | ICD-10-CM | POA: Diagnosis not present

## 2020-01-03 DIAGNOSIS — D631 Anemia in chronic kidney disease: Secondary | ICD-10-CM | POA: Diagnosis not present

## 2020-01-03 DIAGNOSIS — Z992 Dependence on renal dialysis: Secondary | ICD-10-CM | POA: Diagnosis not present

## 2020-01-03 DIAGNOSIS — N2581 Secondary hyperparathyroidism of renal origin: Secondary | ICD-10-CM | POA: Diagnosis not present

## 2020-01-03 DIAGNOSIS — N186 End stage renal disease: Secondary | ICD-10-CM | POA: Diagnosis not present

## 2020-01-08 DIAGNOSIS — N186 End stage renal disease: Secondary | ICD-10-CM | POA: Diagnosis not present

## 2020-01-08 DIAGNOSIS — N2581 Secondary hyperparathyroidism of renal origin: Secondary | ICD-10-CM | POA: Diagnosis not present

## 2020-01-08 DIAGNOSIS — D509 Iron deficiency anemia, unspecified: Secondary | ICD-10-CM | POA: Diagnosis not present

## 2020-01-08 DIAGNOSIS — D631 Anemia in chronic kidney disease: Secondary | ICD-10-CM | POA: Diagnosis not present

## 2020-01-08 DIAGNOSIS — Z992 Dependence on renal dialysis: Secondary | ICD-10-CM | POA: Diagnosis not present

## 2020-01-10 DIAGNOSIS — Z992 Dependence on renal dialysis: Secondary | ICD-10-CM | POA: Diagnosis not present

## 2020-01-10 DIAGNOSIS — N186 End stage renal disease: Secondary | ICD-10-CM | POA: Diagnosis not present

## 2020-01-10 DIAGNOSIS — D631 Anemia in chronic kidney disease: Secondary | ICD-10-CM | POA: Diagnosis not present

## 2020-01-10 DIAGNOSIS — D509 Iron deficiency anemia, unspecified: Secondary | ICD-10-CM | POA: Diagnosis not present

## 2020-01-10 DIAGNOSIS — N2581 Secondary hyperparathyroidism of renal origin: Secondary | ICD-10-CM | POA: Diagnosis not present

## 2020-01-13 DIAGNOSIS — N2581 Secondary hyperparathyroidism of renal origin: Secondary | ICD-10-CM | POA: Diagnosis not present

## 2020-01-13 DIAGNOSIS — N186 End stage renal disease: Secondary | ICD-10-CM | POA: Diagnosis not present

## 2020-01-13 DIAGNOSIS — Z992 Dependence on renal dialysis: Secondary | ICD-10-CM | POA: Diagnosis not present

## 2020-01-15 DIAGNOSIS — N186 End stage renal disease: Secondary | ICD-10-CM | POA: Diagnosis not present

## 2020-01-15 DIAGNOSIS — Z992 Dependence on renal dialysis: Secondary | ICD-10-CM | POA: Diagnosis not present

## 2020-01-15 DIAGNOSIS — N2581 Secondary hyperparathyroidism of renal origin: Secondary | ICD-10-CM | POA: Diagnosis not present

## 2020-01-17 DIAGNOSIS — N186 End stage renal disease: Secondary | ICD-10-CM | POA: Diagnosis not present

## 2020-01-17 DIAGNOSIS — N2581 Secondary hyperparathyroidism of renal origin: Secondary | ICD-10-CM | POA: Diagnosis not present

## 2020-01-17 DIAGNOSIS — Z992 Dependence on renal dialysis: Secondary | ICD-10-CM | POA: Diagnosis not present

## 2020-01-22 DIAGNOSIS — N2581 Secondary hyperparathyroidism of renal origin: Secondary | ICD-10-CM | POA: Diagnosis not present

## 2020-01-22 DIAGNOSIS — Z992 Dependence on renal dialysis: Secondary | ICD-10-CM | POA: Diagnosis not present

## 2020-01-22 DIAGNOSIS — N186 End stage renal disease: Secondary | ICD-10-CM | POA: Diagnosis not present

## 2020-01-30 DIAGNOSIS — Z992 Dependence on renal dialysis: Secondary | ICD-10-CM | POA: Diagnosis not present

## 2020-01-30 DIAGNOSIS — N2581 Secondary hyperparathyroidism of renal origin: Secondary | ICD-10-CM | POA: Diagnosis not present

## 2020-01-30 DIAGNOSIS — N186 End stage renal disease: Secondary | ICD-10-CM | POA: Diagnosis not present

## 2020-02-05 DIAGNOSIS — N186 End stage renal disease: Secondary | ICD-10-CM | POA: Diagnosis not present

## 2020-02-05 DIAGNOSIS — N2581 Secondary hyperparathyroidism of renal origin: Secondary | ICD-10-CM | POA: Diagnosis not present

## 2020-02-05 DIAGNOSIS — Z992 Dependence on renal dialysis: Secondary | ICD-10-CM | POA: Diagnosis not present

## 2020-02-07 DIAGNOSIS — N186 End stage renal disease: Secondary | ICD-10-CM | POA: Diagnosis not present

## 2020-02-07 DIAGNOSIS — Z992 Dependence on renal dialysis: Secondary | ICD-10-CM | POA: Diagnosis not present

## 2020-02-07 DIAGNOSIS — N2581 Secondary hyperparathyroidism of renal origin: Secondary | ICD-10-CM | POA: Diagnosis not present

## 2020-02-09 DIAGNOSIS — N186 End stage renal disease: Secondary | ICD-10-CM | POA: Diagnosis not present

## 2020-02-09 DIAGNOSIS — Z992 Dependence on renal dialysis: Secondary | ICD-10-CM | POA: Diagnosis not present

## 2020-02-09 DIAGNOSIS — N2581 Secondary hyperparathyroidism of renal origin: Secondary | ICD-10-CM | POA: Diagnosis not present

## 2020-02-11 DIAGNOSIS — N186 End stage renal disease: Secondary | ICD-10-CM | POA: Diagnosis not present

## 2020-02-11 DIAGNOSIS — I129 Hypertensive chronic kidney disease with stage 1 through stage 4 chronic kidney disease, or unspecified chronic kidney disease: Secondary | ICD-10-CM | POA: Diagnosis not present

## 2020-02-11 DIAGNOSIS — Z992 Dependence on renal dialysis: Secondary | ICD-10-CM | POA: Diagnosis not present

## 2020-02-13 DIAGNOSIS — N186 End stage renal disease: Secondary | ICD-10-CM | POA: Diagnosis not present

## 2020-02-13 DIAGNOSIS — Z992 Dependence on renal dialysis: Secondary | ICD-10-CM | POA: Diagnosis not present

## 2020-02-13 DIAGNOSIS — N2581 Secondary hyperparathyroidism of renal origin: Secondary | ICD-10-CM | POA: Diagnosis not present

## 2020-02-16 DIAGNOSIS — Z992 Dependence on renal dialysis: Secondary | ICD-10-CM | POA: Diagnosis not present

## 2020-02-16 DIAGNOSIS — N186 End stage renal disease: Secondary | ICD-10-CM | POA: Diagnosis not present

## 2020-02-16 DIAGNOSIS — N2581 Secondary hyperparathyroidism of renal origin: Secondary | ICD-10-CM | POA: Diagnosis not present

## 2020-02-19 DIAGNOSIS — N2581 Secondary hyperparathyroidism of renal origin: Secondary | ICD-10-CM | POA: Diagnosis not present

## 2020-02-19 DIAGNOSIS — Z992 Dependence on renal dialysis: Secondary | ICD-10-CM | POA: Diagnosis not present

## 2020-02-19 DIAGNOSIS — N186 End stage renal disease: Secondary | ICD-10-CM | POA: Diagnosis not present

## 2020-02-21 DIAGNOSIS — N186 End stage renal disease: Secondary | ICD-10-CM | POA: Diagnosis not present

## 2020-02-21 DIAGNOSIS — N2581 Secondary hyperparathyroidism of renal origin: Secondary | ICD-10-CM | POA: Diagnosis not present

## 2020-02-21 DIAGNOSIS — Z992 Dependence on renal dialysis: Secondary | ICD-10-CM | POA: Diagnosis not present

## 2020-02-23 DIAGNOSIS — N186 End stage renal disease: Secondary | ICD-10-CM | POA: Diagnosis not present

## 2020-02-23 DIAGNOSIS — Z992 Dependence on renal dialysis: Secondary | ICD-10-CM | POA: Diagnosis not present

## 2020-02-23 DIAGNOSIS — N2581 Secondary hyperparathyroidism of renal origin: Secondary | ICD-10-CM | POA: Diagnosis not present

## 2020-02-26 DIAGNOSIS — Z992 Dependence on renal dialysis: Secondary | ICD-10-CM | POA: Diagnosis not present

## 2020-02-26 DIAGNOSIS — N186 End stage renal disease: Secondary | ICD-10-CM | POA: Diagnosis not present

## 2020-02-26 DIAGNOSIS — N2581 Secondary hyperparathyroidism of renal origin: Secondary | ICD-10-CM | POA: Diagnosis not present

## 2020-03-01 DIAGNOSIS — N2581 Secondary hyperparathyroidism of renal origin: Secondary | ICD-10-CM | POA: Diagnosis not present

## 2020-03-01 DIAGNOSIS — N186 End stage renal disease: Secondary | ICD-10-CM | POA: Diagnosis not present

## 2020-03-01 DIAGNOSIS — Z992 Dependence on renal dialysis: Secondary | ICD-10-CM | POA: Diagnosis not present

## 2020-03-04 DIAGNOSIS — N186 End stage renal disease: Secondary | ICD-10-CM | POA: Diagnosis not present

## 2020-03-04 DIAGNOSIS — N2581 Secondary hyperparathyroidism of renal origin: Secondary | ICD-10-CM | POA: Diagnosis not present

## 2020-03-04 DIAGNOSIS — Z992 Dependence on renal dialysis: Secondary | ICD-10-CM | POA: Diagnosis not present

## 2020-03-06 DIAGNOSIS — N186 End stage renal disease: Secondary | ICD-10-CM | POA: Diagnosis not present

## 2020-03-06 DIAGNOSIS — N2581 Secondary hyperparathyroidism of renal origin: Secondary | ICD-10-CM | POA: Diagnosis not present

## 2020-03-06 DIAGNOSIS — Z992 Dependence on renal dialysis: Secondary | ICD-10-CM | POA: Diagnosis not present

## 2020-03-10 DIAGNOSIS — N186 End stage renal disease: Secondary | ICD-10-CM | POA: Diagnosis not present

## 2020-03-10 DIAGNOSIS — I129 Hypertensive chronic kidney disease with stage 1 through stage 4 chronic kidney disease, or unspecified chronic kidney disease: Secondary | ICD-10-CM | POA: Diagnosis not present

## 2020-03-10 DIAGNOSIS — Z992 Dependence on renal dialysis: Secondary | ICD-10-CM | POA: Diagnosis not present

## 2020-03-11 DIAGNOSIS — Z992 Dependence on renal dialysis: Secondary | ICD-10-CM | POA: Diagnosis not present

## 2020-03-11 DIAGNOSIS — N186 End stage renal disease: Secondary | ICD-10-CM | POA: Diagnosis not present

## 2020-03-11 DIAGNOSIS — N2581 Secondary hyperparathyroidism of renal origin: Secondary | ICD-10-CM | POA: Diagnosis not present

## 2020-03-13 DIAGNOSIS — N186 End stage renal disease: Secondary | ICD-10-CM | POA: Diagnosis not present

## 2020-03-13 DIAGNOSIS — Z992 Dependence on renal dialysis: Secondary | ICD-10-CM | POA: Diagnosis not present

## 2020-03-13 DIAGNOSIS — N2581 Secondary hyperparathyroidism of renal origin: Secondary | ICD-10-CM | POA: Diagnosis not present

## 2020-03-15 DIAGNOSIS — N186 End stage renal disease: Secondary | ICD-10-CM | POA: Diagnosis not present

## 2020-03-15 DIAGNOSIS — N2581 Secondary hyperparathyroidism of renal origin: Secondary | ICD-10-CM | POA: Diagnosis not present

## 2020-03-15 DIAGNOSIS — Z992 Dependence on renal dialysis: Secondary | ICD-10-CM | POA: Diagnosis not present

## 2020-03-17 ENCOUNTER — Other Ambulatory Visit: Payer: Self-pay

## 2020-03-17 ENCOUNTER — Ambulatory Visit (INDEPENDENT_AMBULATORY_CARE_PROVIDER_SITE_OTHER): Payer: Medicare HMO | Admitting: Physician Assistant

## 2020-03-17 VITALS — BP 142/81 | HR 68 | Temp 98.0°F | Resp 20 | Ht 74.0 in | Wt 175.5 lb

## 2020-03-17 DIAGNOSIS — Z992 Dependence on renal dialysis: Secondary | ICD-10-CM

## 2020-03-17 DIAGNOSIS — N186 End stage renal disease: Secondary | ICD-10-CM

## 2020-03-17 NOTE — Progress Notes (Signed)
  POST OPERATIVE DIALYSIS ACCESS OFFICE NOTE    CC:  F/u for dialysis access surgery  HPI:  This is a 60 y.o. male who is s/p right brachiocephalic AV fistula 07/01/3084.  He was referred here today by Dr. 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  His last visit was on 03/02/2017 for postop follow-up. He is without complaints and denies persistent bleeding after dialysis. Dialysis days:  TTS     Allergies  Allergen Reactions  . Lisinopril Cough    Current Outpatient Medications  Medication Sig Dispense Refill  . amLODipine (NORVASC) 10 MG tablet Take 10 mg by mouth every evening.    . carvedilol (COREG) 25 MG tablet Take 1 tablet (25 mg total) by mouth 2 (two) times daily with a meal. 60 tablet 2  . hydrALAZINE (APRESOLINE) 100 MG tablet Take 1 tablet (100 mg total) by mouth 3 (three) times daily. 90 tablet 0  . multivitamin (RENA-VIT) TABS tablet Take 1 tablet by mouth daily.  11  . polyvinyl alcohol (LIQUIFILM TEARS) 1.4 % ophthalmic solution Place 1 drop into both eyes as needed for dry eyes.     No current facility-administered medications for this visit.     ROS:  See HPI  There were no vitals taken for this visit.   Physical Exam:  General appearance: The patient is well-developed, well-nourished in no apparent distress Cardiac: The heart rate and rhythm is regular Respiratory: Nonlabored Extremities: Right upper arm AV fistula with 2 areas of aneurysmal dilatation.  The skin is somewhat shiny over the proximal aneurysm however I am able to pinch skin over this area.  There is no ulceration.  Radial pulses 2+.      Assessment/Plan:   I reviewed the case with Dr. Donzetta Matters today.  I explained to the patient that it would not be advisable to intervene on revising his fistula as it is functional, he has no posttreatment bleeding or pain.  Advised him to ask his dialysis personnel to avoid the aneurysmal areas when accessing this fistula.  We will be happy to  follow-up with him at any time should he develop bleeding, skin ulcerations or other concerns.  Barbie Banner, PA-C 03/17/2020 9:59 AM [facility] [phone]  Clinic MD:  Donzetta Matters

## 2020-03-18 DIAGNOSIS — N186 End stage renal disease: Secondary | ICD-10-CM | POA: Diagnosis not present

## 2020-03-18 DIAGNOSIS — N2581 Secondary hyperparathyroidism of renal origin: Secondary | ICD-10-CM | POA: Diagnosis not present

## 2020-03-18 DIAGNOSIS — Z992 Dependence on renal dialysis: Secondary | ICD-10-CM | POA: Diagnosis not present

## 2020-03-22 DIAGNOSIS — N186 End stage renal disease: Secondary | ICD-10-CM | POA: Diagnosis not present

## 2020-03-22 DIAGNOSIS — N2581 Secondary hyperparathyroidism of renal origin: Secondary | ICD-10-CM | POA: Diagnosis not present

## 2020-03-22 DIAGNOSIS — Z992 Dependence on renal dialysis: Secondary | ICD-10-CM | POA: Diagnosis not present

## 2020-03-25 DIAGNOSIS — Z992 Dependence on renal dialysis: Secondary | ICD-10-CM | POA: Diagnosis not present

## 2020-03-25 DIAGNOSIS — N186 End stage renal disease: Secondary | ICD-10-CM | POA: Diagnosis not present

## 2020-03-25 DIAGNOSIS — N2581 Secondary hyperparathyroidism of renal origin: Secondary | ICD-10-CM | POA: Diagnosis not present

## 2020-03-29 DIAGNOSIS — N2581 Secondary hyperparathyroidism of renal origin: Secondary | ICD-10-CM | POA: Diagnosis not present

## 2020-03-29 DIAGNOSIS — Z992 Dependence on renal dialysis: Secondary | ICD-10-CM | POA: Diagnosis not present

## 2020-03-29 DIAGNOSIS — N186 End stage renal disease: Secondary | ICD-10-CM | POA: Diagnosis not present

## 2020-04-02 DIAGNOSIS — N186 End stage renal disease: Secondary | ICD-10-CM | POA: Diagnosis not present

## 2020-04-02 DIAGNOSIS — Z992 Dependence on renal dialysis: Secondary | ICD-10-CM | POA: Diagnosis not present

## 2020-04-02 DIAGNOSIS — N2581 Secondary hyperparathyroidism of renal origin: Secondary | ICD-10-CM | POA: Diagnosis not present

## 2020-04-05 DIAGNOSIS — N2581 Secondary hyperparathyroidism of renal origin: Secondary | ICD-10-CM | POA: Diagnosis not present

## 2020-04-05 DIAGNOSIS — N186 End stage renal disease: Secondary | ICD-10-CM | POA: Diagnosis not present

## 2020-04-05 DIAGNOSIS — Z992 Dependence on renal dialysis: Secondary | ICD-10-CM | POA: Diagnosis not present

## 2020-04-08 DIAGNOSIS — N186 End stage renal disease: Secondary | ICD-10-CM | POA: Diagnosis not present

## 2020-04-08 DIAGNOSIS — Z992 Dependence on renal dialysis: Secondary | ICD-10-CM | POA: Diagnosis not present

## 2020-04-08 DIAGNOSIS — N2581 Secondary hyperparathyroidism of renal origin: Secondary | ICD-10-CM | POA: Diagnosis not present

## 2020-04-10 DIAGNOSIS — Z992 Dependence on renal dialysis: Secondary | ICD-10-CM | POA: Diagnosis not present

## 2020-04-10 DIAGNOSIS — N186 End stage renal disease: Secondary | ICD-10-CM | POA: Diagnosis not present

## 2020-04-10 DIAGNOSIS — N2581 Secondary hyperparathyroidism of renal origin: Secondary | ICD-10-CM | POA: Diagnosis not present

## 2020-04-10 DIAGNOSIS — I129 Hypertensive chronic kidney disease with stage 1 through stage 4 chronic kidney disease, or unspecified chronic kidney disease: Secondary | ICD-10-CM | POA: Diagnosis not present

## 2020-04-15 DIAGNOSIS — Z992 Dependence on renal dialysis: Secondary | ICD-10-CM | POA: Diagnosis not present

## 2020-04-15 DIAGNOSIS — N186 End stage renal disease: Secondary | ICD-10-CM | POA: Diagnosis not present

## 2020-04-15 DIAGNOSIS — N2581 Secondary hyperparathyroidism of renal origin: Secondary | ICD-10-CM | POA: Diagnosis not present

## 2020-04-17 DIAGNOSIS — Z992 Dependence on renal dialysis: Secondary | ICD-10-CM | POA: Diagnosis not present

## 2020-04-17 DIAGNOSIS — N186 End stage renal disease: Secondary | ICD-10-CM | POA: Diagnosis not present

## 2020-04-17 DIAGNOSIS — N2581 Secondary hyperparathyroidism of renal origin: Secondary | ICD-10-CM | POA: Diagnosis not present

## 2020-04-19 DIAGNOSIS — N186 End stage renal disease: Secondary | ICD-10-CM | POA: Diagnosis not present

## 2020-04-19 DIAGNOSIS — N2581 Secondary hyperparathyroidism of renal origin: Secondary | ICD-10-CM | POA: Diagnosis not present

## 2020-04-19 DIAGNOSIS — Z992 Dependence on renal dialysis: Secondary | ICD-10-CM | POA: Diagnosis not present

## 2020-04-22 DIAGNOSIS — Z992 Dependence on renal dialysis: Secondary | ICD-10-CM | POA: Diagnosis not present

## 2020-04-22 DIAGNOSIS — N2581 Secondary hyperparathyroidism of renal origin: Secondary | ICD-10-CM | POA: Diagnosis not present

## 2020-04-22 DIAGNOSIS — N186 End stage renal disease: Secondary | ICD-10-CM | POA: Diagnosis not present

## 2020-04-26 DIAGNOSIS — N2581 Secondary hyperparathyroidism of renal origin: Secondary | ICD-10-CM | POA: Diagnosis not present

## 2020-04-26 DIAGNOSIS — N186 End stage renal disease: Secondary | ICD-10-CM | POA: Diagnosis not present

## 2020-04-26 DIAGNOSIS — Z992 Dependence on renal dialysis: Secondary | ICD-10-CM | POA: Diagnosis not present

## 2020-04-30 DIAGNOSIS — Z992 Dependence on renal dialysis: Secondary | ICD-10-CM | POA: Diagnosis not present

## 2020-04-30 DIAGNOSIS — N2581 Secondary hyperparathyroidism of renal origin: Secondary | ICD-10-CM | POA: Diagnosis not present

## 2020-04-30 DIAGNOSIS — N186 End stage renal disease: Secondary | ICD-10-CM | POA: Diagnosis not present

## 2020-05-03 DIAGNOSIS — N2581 Secondary hyperparathyroidism of renal origin: Secondary | ICD-10-CM | POA: Diagnosis not present

## 2020-05-03 DIAGNOSIS — N186 End stage renal disease: Secondary | ICD-10-CM | POA: Diagnosis not present

## 2020-05-03 DIAGNOSIS — Z992 Dependence on renal dialysis: Secondary | ICD-10-CM | POA: Diagnosis not present

## 2020-05-08 DIAGNOSIS — N2581 Secondary hyperparathyroidism of renal origin: Secondary | ICD-10-CM | POA: Diagnosis not present

## 2020-05-08 DIAGNOSIS — N186 End stage renal disease: Secondary | ICD-10-CM | POA: Diagnosis not present

## 2020-05-08 DIAGNOSIS — Z992 Dependence on renal dialysis: Secondary | ICD-10-CM | POA: Diagnosis not present

## 2020-05-10 DIAGNOSIS — N2581 Secondary hyperparathyroidism of renal origin: Secondary | ICD-10-CM | POA: Diagnosis not present

## 2020-05-10 DIAGNOSIS — Z992 Dependence on renal dialysis: Secondary | ICD-10-CM | POA: Diagnosis not present

## 2020-05-10 DIAGNOSIS — I129 Hypertensive chronic kidney disease with stage 1 through stage 4 chronic kidney disease, or unspecified chronic kidney disease: Secondary | ICD-10-CM | POA: Diagnosis not present

## 2020-05-10 DIAGNOSIS — N186 End stage renal disease: Secondary | ICD-10-CM | POA: Diagnosis not present

## 2020-05-15 DIAGNOSIS — Z992 Dependence on renal dialysis: Secondary | ICD-10-CM | POA: Diagnosis not present

## 2020-05-15 DIAGNOSIS — N2581 Secondary hyperparathyroidism of renal origin: Secondary | ICD-10-CM | POA: Diagnosis not present

## 2020-05-15 DIAGNOSIS — N186 End stage renal disease: Secondary | ICD-10-CM | POA: Diagnosis not present

## 2020-05-17 DIAGNOSIS — Z992 Dependence on renal dialysis: Secondary | ICD-10-CM | POA: Diagnosis not present

## 2020-05-17 DIAGNOSIS — N2581 Secondary hyperparathyroidism of renal origin: Secondary | ICD-10-CM | POA: Diagnosis not present

## 2020-05-17 DIAGNOSIS — N186 End stage renal disease: Secondary | ICD-10-CM | POA: Diagnosis not present

## 2020-05-20 DIAGNOSIS — N186 End stage renal disease: Secondary | ICD-10-CM | POA: Diagnosis not present

## 2020-05-20 DIAGNOSIS — N2581 Secondary hyperparathyroidism of renal origin: Secondary | ICD-10-CM | POA: Diagnosis not present

## 2020-05-20 DIAGNOSIS — Z992 Dependence on renal dialysis: Secondary | ICD-10-CM | POA: Diagnosis not present

## 2020-05-24 DIAGNOSIS — N186 End stage renal disease: Secondary | ICD-10-CM | POA: Diagnosis not present

## 2020-05-24 DIAGNOSIS — N2581 Secondary hyperparathyroidism of renal origin: Secondary | ICD-10-CM | POA: Diagnosis not present

## 2020-05-24 DIAGNOSIS — Z992 Dependence on renal dialysis: Secondary | ICD-10-CM | POA: Diagnosis not present

## 2020-05-27 DIAGNOSIS — N186 End stage renal disease: Secondary | ICD-10-CM | POA: Diagnosis not present

## 2020-05-27 DIAGNOSIS — Z992 Dependence on renal dialysis: Secondary | ICD-10-CM | POA: Diagnosis not present

## 2020-05-27 DIAGNOSIS — N2581 Secondary hyperparathyroidism of renal origin: Secondary | ICD-10-CM | POA: Diagnosis not present

## 2020-06-03 DIAGNOSIS — Z992 Dependence on renal dialysis: Secondary | ICD-10-CM | POA: Diagnosis not present

## 2020-06-03 DIAGNOSIS — N186 End stage renal disease: Secondary | ICD-10-CM | POA: Diagnosis not present

## 2020-06-03 DIAGNOSIS — N2581 Secondary hyperparathyroidism of renal origin: Secondary | ICD-10-CM | POA: Diagnosis not present

## 2020-06-10 DIAGNOSIS — I129 Hypertensive chronic kidney disease with stage 1 through stage 4 chronic kidney disease, or unspecified chronic kidney disease: Secondary | ICD-10-CM | POA: Diagnosis not present

## 2020-06-10 DIAGNOSIS — N2581 Secondary hyperparathyroidism of renal origin: Secondary | ICD-10-CM | POA: Diagnosis not present

## 2020-06-10 DIAGNOSIS — Z992 Dependence on renal dialysis: Secondary | ICD-10-CM | POA: Diagnosis not present

## 2020-06-10 DIAGNOSIS — N186 End stage renal disease: Secondary | ICD-10-CM | POA: Diagnosis not present

## 2020-06-12 DIAGNOSIS — N2581 Secondary hyperparathyroidism of renal origin: Secondary | ICD-10-CM | POA: Diagnosis not present

## 2020-06-12 DIAGNOSIS — N186 End stage renal disease: Secondary | ICD-10-CM | POA: Diagnosis not present

## 2020-06-12 DIAGNOSIS — Z992 Dependence on renal dialysis: Secondary | ICD-10-CM | POA: Diagnosis not present

## 2020-06-14 DIAGNOSIS — N186 End stage renal disease: Secondary | ICD-10-CM | POA: Diagnosis not present

## 2020-06-14 DIAGNOSIS — N2581 Secondary hyperparathyroidism of renal origin: Secondary | ICD-10-CM | POA: Diagnosis not present

## 2020-06-14 DIAGNOSIS — Z992 Dependence on renal dialysis: Secondary | ICD-10-CM | POA: Diagnosis not present

## 2020-06-17 DIAGNOSIS — N2581 Secondary hyperparathyroidism of renal origin: Secondary | ICD-10-CM | POA: Diagnosis not present

## 2020-06-17 DIAGNOSIS — N186 End stage renal disease: Secondary | ICD-10-CM | POA: Diagnosis not present

## 2020-06-17 DIAGNOSIS — Z992 Dependence on renal dialysis: Secondary | ICD-10-CM | POA: Diagnosis not present

## 2020-06-19 DIAGNOSIS — N2581 Secondary hyperparathyroidism of renal origin: Secondary | ICD-10-CM | POA: Diagnosis not present

## 2020-06-19 DIAGNOSIS — Z992 Dependence on renal dialysis: Secondary | ICD-10-CM | POA: Diagnosis not present

## 2020-06-19 DIAGNOSIS — N186 End stage renal disease: Secondary | ICD-10-CM | POA: Diagnosis not present

## 2020-06-21 DIAGNOSIS — N2581 Secondary hyperparathyroidism of renal origin: Secondary | ICD-10-CM | POA: Diagnosis not present

## 2020-06-21 DIAGNOSIS — N186 End stage renal disease: Secondary | ICD-10-CM | POA: Diagnosis not present

## 2020-06-21 DIAGNOSIS — Z992 Dependence on renal dialysis: Secondary | ICD-10-CM | POA: Diagnosis not present

## 2020-06-24 DIAGNOSIS — Z992 Dependence on renal dialysis: Secondary | ICD-10-CM | POA: Diagnosis not present

## 2020-06-24 DIAGNOSIS — N186 End stage renal disease: Secondary | ICD-10-CM | POA: Diagnosis not present

## 2020-06-24 DIAGNOSIS — N2581 Secondary hyperparathyroidism of renal origin: Secondary | ICD-10-CM | POA: Diagnosis not present

## 2020-06-26 DIAGNOSIS — N186 End stage renal disease: Secondary | ICD-10-CM | POA: Diagnosis not present

## 2020-06-26 DIAGNOSIS — Z992 Dependence on renal dialysis: Secondary | ICD-10-CM | POA: Diagnosis not present

## 2020-06-26 DIAGNOSIS — N2581 Secondary hyperparathyroidism of renal origin: Secondary | ICD-10-CM | POA: Diagnosis not present

## 2020-06-28 DIAGNOSIS — Z992 Dependence on renal dialysis: Secondary | ICD-10-CM | POA: Diagnosis not present

## 2020-06-28 DIAGNOSIS — N186 End stage renal disease: Secondary | ICD-10-CM | POA: Diagnosis not present

## 2020-06-28 DIAGNOSIS — N2581 Secondary hyperparathyroidism of renal origin: Secondary | ICD-10-CM | POA: Diagnosis not present

## 2020-07-01 DIAGNOSIS — N186 End stage renal disease: Secondary | ICD-10-CM | POA: Diagnosis not present

## 2020-07-01 DIAGNOSIS — N2581 Secondary hyperparathyroidism of renal origin: Secondary | ICD-10-CM | POA: Diagnosis not present

## 2020-07-01 DIAGNOSIS — Z992 Dependence on renal dialysis: Secondary | ICD-10-CM | POA: Diagnosis not present

## 2020-07-03 DIAGNOSIS — N2581 Secondary hyperparathyroidism of renal origin: Secondary | ICD-10-CM | POA: Diagnosis not present

## 2020-07-03 DIAGNOSIS — N186 End stage renal disease: Secondary | ICD-10-CM | POA: Diagnosis not present

## 2020-07-03 DIAGNOSIS — Z992 Dependence on renal dialysis: Secondary | ICD-10-CM | POA: Diagnosis not present

## 2020-07-08 DIAGNOSIS — N2581 Secondary hyperparathyroidism of renal origin: Secondary | ICD-10-CM | POA: Diagnosis not present

## 2020-07-08 DIAGNOSIS — Z992 Dependence on renal dialysis: Secondary | ICD-10-CM | POA: Diagnosis not present

## 2020-07-08 DIAGNOSIS — N186 End stage renal disease: Secondary | ICD-10-CM | POA: Diagnosis not present

## 2020-07-10 DIAGNOSIS — Z992 Dependence on renal dialysis: Secondary | ICD-10-CM | POA: Diagnosis not present

## 2020-07-10 DIAGNOSIS — I129 Hypertensive chronic kidney disease with stage 1 through stage 4 chronic kidney disease, or unspecified chronic kidney disease: Secondary | ICD-10-CM | POA: Diagnosis not present

## 2020-07-10 DIAGNOSIS — N186 End stage renal disease: Secondary | ICD-10-CM | POA: Diagnosis not present

## 2020-07-10 DIAGNOSIS — N2581 Secondary hyperparathyroidism of renal origin: Secondary | ICD-10-CM | POA: Diagnosis not present

## 2020-07-13 ENCOUNTER — Emergency Department (HOSPITAL_COMMUNITY)
Admission: EM | Admit: 2020-07-13 | Discharge: 2020-07-13 | Disposition: A | Discharge status: Home / Self Care | Payer: Medicare HMO | Attending: Emergency Medicine | Admitting: Emergency Medicine

## 2020-07-13 ENCOUNTER — Emergency Department (HOSPITAL_COMMUNITY): Payer: Medicare HMO

## 2020-07-13 ENCOUNTER — Other Ambulatory Visit: Payer: Self-pay

## 2020-07-13 ENCOUNTER — Encounter (HOSPITAL_COMMUNITY): Payer: Self-pay | Admitting: Emergency Medicine

## 2020-07-13 DIAGNOSIS — Z992 Dependence on renal dialysis: Secondary | ICD-10-CM | POA: Diagnosis not present

## 2020-07-13 DIAGNOSIS — R9431 Abnormal electrocardiogram [ECG] [EKG]: Secondary | ICD-10-CM | POA: Diagnosis not present

## 2020-07-13 DIAGNOSIS — I120 Hypertensive chronic kidney disease with stage 5 chronic kidney disease or end stage renal disease: Secondary | ICD-10-CM | POA: Diagnosis not present

## 2020-07-13 DIAGNOSIS — Z79899 Other long term (current) drug therapy: Secondary | ICD-10-CM | POA: Insufficient documentation

## 2020-07-13 DIAGNOSIS — I5030 Unspecified diastolic (congestive) heart failure: Secondary | ICD-10-CM | POA: Diagnosis not present

## 2020-07-13 DIAGNOSIS — M62830 Muscle spasm of back: Secondary | ICD-10-CM | POA: Insufficient documentation

## 2020-07-13 DIAGNOSIS — I132 Hypertensive heart and chronic kidney disease with heart failure and with stage 5 chronic kidney disease, or end stage renal disease: Secondary | ICD-10-CM | POA: Insufficient documentation

## 2020-07-13 DIAGNOSIS — J45909 Unspecified asthma, uncomplicated: Secondary | ICD-10-CM | POA: Diagnosis not present

## 2020-07-13 DIAGNOSIS — Z20822 Contact with and (suspected) exposure to covid-19: Secondary | ICD-10-CM | POA: Diagnosis not present

## 2020-07-13 DIAGNOSIS — M545 Low back pain, unspecified: Secondary | ICD-10-CM | POA: Diagnosis present

## 2020-07-13 DIAGNOSIS — Z87891 Personal history of nicotine dependence: Secondary | ICD-10-CM | POA: Diagnosis not present

## 2020-07-13 DIAGNOSIS — I517 Cardiomegaly: Secondary | ICD-10-CM | POA: Diagnosis not present

## 2020-07-13 DIAGNOSIS — N186 End stage renal disease: Secondary | ICD-10-CM | POA: Diagnosis not present

## 2020-07-13 DIAGNOSIS — R0902 Hypoxemia: Secondary | ICD-10-CM | POA: Diagnosis not present

## 2020-07-13 DIAGNOSIS — J90 Pleural effusion, not elsewhere classified: Secondary | ICD-10-CM | POA: Diagnosis not present

## 2020-07-13 DIAGNOSIS — R0602 Shortness of breath: Secondary | ICD-10-CM | POA: Diagnosis not present

## 2020-07-13 DIAGNOSIS — M549 Dorsalgia, unspecified: Secondary | ICD-10-CM | POA: Diagnosis not present

## 2020-07-13 DIAGNOSIS — I10 Essential (primary) hypertension: Secondary | ICD-10-CM | POA: Diagnosis not present

## 2020-07-13 LAB — COMPREHENSIVE METABOLIC PANEL
ALT: 21 U/L (ref 0–44)
AST: 19 U/L (ref 15–41)
Albumin: 3.5 g/dL (ref 3.5–5.0)
Alkaline Phosphatase: 34 U/L — ABNORMAL LOW (ref 38–126)
Anion gap: 15 (ref 5–15)
BUN: 97 mg/dL — ABNORMAL HIGH (ref 6–20)
CO2: 24 mmol/L (ref 22–32)
Calcium: 9.1 mg/dL (ref 8.9–10.3)
Chloride: 99 mmol/L (ref 98–111)
Creatinine, Ser: 13.96 mg/dL — ABNORMAL HIGH (ref 0.61–1.24)
GFR, Estimated: 4 mL/min — ABNORMAL LOW (ref >60)
Glucose, Bld: 114 mg/dL — ABNORMAL HIGH (ref 70–99)
Potassium: 6.4 mmol/L (ref 3.5–5.1)
Sodium: 138 mmol/L (ref 135–145)
Total Bilirubin: 0.7 mg/dL (ref 0.3–1.2)
Total Protein: 6.3 g/dL — ABNORMAL LOW (ref 6.5–8.1)

## 2020-07-13 LAB — CBC
HCT: 31.6 % — ABNORMAL LOW (ref 39.0–52.0)
Hemoglobin: 10.3 g/dL — ABNORMAL LOW (ref 13.0–17.0)
MCH: 30.9 pg (ref 26.0–34.0)
MCHC: 32.6 g/dL (ref 30.0–36.0)
MCV: 94.9 fL (ref 80.0–100.0)
Platelets: 200 10*3/uL (ref 150–400)
RBC: 3.33 MIL/uL — ABNORMAL LOW (ref 4.22–5.81)
RDW: 13.1 % (ref 11.5–15.5)
WBC: 6.6 10*3/uL (ref 4.0–10.5)
nRBC: 0 % (ref 0.0–0.2)

## 2020-07-13 LAB — HEPATITIS B SURFACE ANTIGEN: Hepatitis B Surface Ag: NONREACTIVE

## 2020-07-13 LAB — CBC WITH DIFFERENTIAL/PLATELET
Abs Immature Granulocytes: 0.02 10*3/uL (ref 0.00–0.07)
Basophils Absolute: 0 10*3/uL (ref 0.0–0.1)
Basophils Relative: 1 %
Eosinophils Absolute: 0.2 10*3/uL (ref 0.0–0.5)
Eosinophils Relative: 3 %
HCT: 31.5 % — ABNORMAL LOW (ref 39.0–52.0)
Hemoglobin: 10.1 g/dL — ABNORMAL LOW (ref 13.0–17.0)
Immature Granulocytes: 0 %
Lymphocytes Relative: 6 %
Lymphs Abs: 0.4 10*3/uL — ABNORMAL LOW (ref 0.7–4.0)
MCH: 30.5 pg (ref 26.0–34.0)
MCHC: 32.1 g/dL (ref 30.0–36.0)
MCV: 95.2 fL (ref 80.0–100.0)
Monocytes Absolute: 0.6 10*3/uL (ref 0.1–1.0)
Monocytes Relative: 9 %
Neutro Abs: 5.6 10*3/uL (ref 1.7–7.7)
Neutrophils Relative %: 81 %
Platelets: 220 10*3/uL (ref 150–400)
RBC: 3.31 MIL/uL — ABNORMAL LOW (ref 4.22–5.81)
RDW: 13 % (ref 11.5–15.5)
WBC: 6.8 10*3/uL (ref 4.0–10.5)
nRBC: 0 % (ref 0.0–0.2)

## 2020-07-13 LAB — RENAL FUNCTION PANEL
Albumin: 3.5 g/dL (ref 3.5–5.0)
Anion gap: 14 (ref 5–15)
BUN: 97 mg/dL — ABNORMAL HIGH (ref 6–20)
CO2: 23 mmol/L (ref 22–32)
Calcium: 9.1 mg/dL (ref 8.9–10.3)
Chloride: 99 mmol/L (ref 98–111)
Creatinine, Ser: 13.69 mg/dL — ABNORMAL HIGH (ref 0.61–1.24)
GFR, Estimated: 4 mL/min — ABNORMAL LOW (ref >60)
Glucose, Bld: 108 mg/dL — ABNORMAL HIGH (ref 70–99)
Phosphorus: 11.3 mg/dL — ABNORMAL HIGH (ref 2.5–4.6)
Potassium: 6.3 mmol/L (ref 3.5–5.1)
Sodium: 136 mmol/L (ref 135–145)

## 2020-07-13 LAB — RESP PANEL BY RT-PCR (FLU A&B, COVID) ARPGX2
Influenza A by PCR: NEGATIVE
Influenza B by PCR: NEGATIVE
SARS Coronavirus 2 by RT PCR: NEGATIVE

## 2020-07-13 LAB — HEPATITIS B CORE ANTIBODY, TOTAL: Hep B Core Total Ab: NONREACTIVE

## 2020-07-13 LAB — POC SARS CORONAVIRUS 2 AG -  ED: SARSCOV2ONAVIRUS 2 AG: NEGATIVE

## 2020-07-13 MED ORDER — SODIUM CHLORIDE 0.9 % IV SOLN: 100.0000 mL | INTRAVENOUS | Status: DC | PRN

## 2020-07-13 MED ORDER — CHLORHEXIDINE GLUCONATE CLOTH 2 % EX PADS: 6.0000 | MEDICATED_PAD | Freq: Every day | CUTANEOUS | Status: DC

## 2020-07-13 MED ORDER — ALTEPLASE 2 MG IJ SOLR: 2.0000 mg | Freq: Once | INTRAMUSCULAR | Status: DC | PRN

## 2020-07-13 MED ORDER — HYDROCODONE-ACETAMINOPHEN 5-325 MG PO TABS: 1.0000 | ORAL_TABLET | Freq: Four times a day (QID) | ORAL | 0 refills | Status: DC | PRN

## 2020-07-13 MED ORDER — LABETALOL HCL 5 MG/ML IV SOLN
20.0000 mg | Freq: Once | INTRAVENOUS | Status: AC
  Administered 2020-07-13: 20 mg via INTRAVENOUS
  Filled 2020-07-13: qty 4

## 2020-07-13 MED ORDER — METHOCARBAMOL 500 MG PO TABS
500.0000 mg | ORAL_TABLET | Freq: Once | ORAL | Status: DC
  Filled 2020-07-13: qty 1

## 2020-07-13 MED ORDER — LIDOCAINE-PRILOCAINE 2.5-2.5 % EX CREA
1.0000 "application " | TOPICAL_CREAM | CUTANEOUS | Status: DC | PRN
  Filled 2020-07-13: qty 5

## 2020-07-13 MED ORDER — LIDOCAINE HCL (PF) 1 % IJ SOLN: 5.0000 mL | INTRAMUSCULAR | Status: DC | PRN

## 2020-07-13 MED ORDER — HEPARIN SODIUM (PORCINE) 1000 UNIT/ML IJ SOLN
INTRAMUSCULAR | Status: AC
  Administered 2020-07-13: 1000 [IU] via INTRAVENOUS_CENTRAL
  Filled 2020-07-13: qty 6

## 2020-07-13 MED ORDER — HEPARIN SODIUM (PORCINE) 1000 UNIT/ML DIALYSIS
1000.0000 [IU] | INTRAMUSCULAR | Status: DC | PRN
  Filled 2020-07-13: qty 1

## 2020-07-13 MED ORDER — METHOCARBAMOL 500 MG PO TABS: 500.0000 mg | ORAL_TABLET | Freq: Two times a day (BID) | ORAL | 0 refills | Status: DC

## 2020-07-13 MED ORDER — CLONIDINE HCL 0.1 MG PO TABS
0.1000 mg | ORAL_TABLET | ORAL | Status: AC
  Administered 2020-07-13: 0.2 mg via ORAL
  Filled 2020-07-13: qty 2

## 2020-07-13 MED ORDER — HYDROMORPHONE HCL 1 MG/ML IJ SOLN
INTRAMUSCULAR | Status: AC
  Administered 2020-07-13: 1 mg via INTRAVENOUS
  Filled 2020-07-13: qty 1

## 2020-07-13 MED ORDER — PENTAFLUOROPROP-TETRAFLUOROETH EX AERO
1.0000 "application " | INHALATION_SPRAY | CUTANEOUS | Status: DC | PRN
  Filled 2020-07-13: qty 116

## 2020-07-13 MED ORDER — HEPARIN SODIUM (PORCINE) 1000 UNIT/ML DIALYSIS
6000.0000 [IU] | INTRAMUSCULAR | Status: DC | PRN
  Filled 2020-07-13 (×2): qty 6

## 2020-07-13 MED ORDER — HYDROMORPHONE HCL 1 MG/ML IJ SOLN: 1.0000 mg | INTRAMUSCULAR | Status: DC | PRN

## 2020-07-13 MED ORDER — HYDROMORPHONE HCL 1 MG/ML IJ SOLN
1.0000 mg | Freq: Once | INTRAMUSCULAR | Status: AC
  Administered 2020-07-13: 1 mg via INTRAVENOUS
  Filled 2020-07-13: qty 1

## 2020-07-13 NOTE — Procedures (Signed)
Asked to see patient for dialysis due to high K+ and SOB.  Pt is usually compliant, missed HD yest due to episode of acute back pain. HD is TTS.  Here the K is 6.4, BP's high 200 /120, CXR vasc congestion, on 2L [place].  Plan is for acute HD upstairs today, then will dc back to ED for reassessment prior to possible dc home.   Will do full consult if pt is formally admitted.   I was present at this dialysis session, have reviewed the session itself and made  appropriate changes Kelly Splinter MD [place] pager [phone]   07/13/2020, 3:31 PM  Kelly Splinter, MD 07/13/2020, 3:33 PM

## 2020-07-13 NOTE — ED Provider Notes (Signed)
Patient seen after completion of dialysis.  Patient is improved.  He desires discharge home.  He understands need for close follow-up with outpatient dialysis.   Valarie Merino, MD 07/13/20 2106

## 2020-07-13 NOTE — ED Notes (Signed)
Pt is going to dialysis bay [facility] in [place].

## 2020-07-13 NOTE — ED Notes (Signed)
Pt placed on 6 l

## 2020-07-13 NOTE — ED Triage Notes (Signed)
Patient with back pain for the last two days.  Patient having a hard time getting up and moving around.  No position helps the pain.  Patient states that he is a Sat Tu Thurs and missed Sat dialysis due to not being able to move well and pain.  Patient is hypoxic when found by EMS in low 80's. O2 sat increased when O2 applied at 2L via [place] to 94%.

## 2020-07-13 NOTE — ED Notes (Signed)
Reports he missed bp meds yesterday and today.

## 2020-07-13 NOTE — ED Provider Notes (Signed)
[facility] EMERGENCY DEPARTMENT Provider Note  CSN: [id number] Arrival date & time: 07/13/20 0550    History Chief Complaint  Patient presents with   Back Pain    Jason [name]. is a 60 y.o. male with history of ESRD on HD TTS, still makes urine, reports 2 days of severe lower back pain, worse on the left and exacerbated by movement. He was unable to get out of bed yesterday due to pain and missed dialysis. He denies any dysuria, hematuria or fever. No falls or injury. He has not had similar symptoms before. Denies any numbness or weakness in his legs. No incontinence or groin numbness   Past Medical History:  Diagnosis Date   A-V fistula (HCC)    Right arm   Abdominal hernia    AKI (acute kidney injury) ([place]) 01/2015   Anasarca    Anemia    Asthma    as a child   Bilateral inguinal hernia    Bilateral pleural effusion 01/2017   Cellulitis 01/16/2017   Bilateral lower extremity   CHF (congestive heart failure) (HCC)    Chronic venous insufficiency    Concentric left ventricular hypertrophy 08/17/2017   Moderated, noted on ECHO   Diastolic dysfunction 43/15/4008   noted on ECHO   Elevated LFTs    per patient, resolved   End stage renal disease ([place])    Dialysis T/Th/Sa  Started 02/2017/pt is waiting for a kidney transplant   H/O right inguinal hernia repair 11/2017   History of colonoscopy 03/20/2018   History of elevated PSA 10/2017   Hypertension    Pneumonia    Pulmonary hypertension ([place])    Moderate   Venous stasis ulcer ([place])    bil legs/ healed up now per pt (03/06/18)    Past Surgical History:  Procedure Laterality Date   AV FISTULA PLACEMENT Right 01/20/2017   Procedure: ARTERIOVENOUS (AV) FISTULA CREATION;  Surgeon: Angelia Mould, MD;  Location: [facility];  Service: Vascular;  Laterality: Right;   HERNIA REPAIR     INSERTION OF DIALYSIS CATHETER Right 01/20/2017   Procedure: INSERTION OF DIALYSIS CATHETER;  Surgeon: Angelia Mould, MD;   Location: [facility];  Service: Vascular;  Laterality: Right;   INSERTION OF MESH N/A 11/18/2017   Procedure: INSERTION OF MESH;  Surgeon: Michael Boston, MD;  Location: [facility];  Service: General;  Laterality: N/A;   IR FLUORO GUIDE CV LINE RIGHT  01/18/2017   IR US GUIDE VASC ACCESS RIGHT  01/18/2017   LAPAROSCOPIC INGUINAL HERNIA WITH UMBILICAL HERNIA Bilateral 11/18/2017   Procedure: LAPAROSCOPIC BILATERAL INGUINAL HERNIA REPAIR WITH UMBILICAL HERNIA REPAIR WITH MESH;  Surgeon: Michael Boston, MD;  Location: [facility];  Service: General;  Laterality: Bilateral;   TOE SURGERY     right fracture in big toe   TONSILLECTOMY      Family History  Problem Relation Age of Onset   Heart attack Mother 33       CABG hx   Heart disease Mother    Hypertension Mother    Stroke Mother    Diabetes Father    Healthy Brother    Prostate cancer Maternal Grandfather    Colon cancer Neg Hx    Colon polyps Neg Hx    Esophageal cancer Neg Hx    Stomach cancer Neg Hx    Rectal cancer Neg Hx     Social History   Tobacco Use   Smoking status: Former    Years:  17.00    Pack years: 0.00    Types: Cigarettes    Quit date: 95    Years since quitting: 25.5   Smokeless tobacco: Never   Tobacco comments:    quit smoking in 1997  Vaping Use   Vaping Use: Never used  Substance Use Topics   Alcohol use: Not Currently    Comment: every several months, rare   Drug use: Not Currently    Comment: marijuana     Home Medications Prior to Admission medications   Medication Sig Start Date End Date Taking? Authorizing Provider  HYDROcodone-acetaminophen (NORCO/VICODIN) 5-325 MG tablet Take 1 tablet by mouth every 6 (six) hours as needed for severe pain. 07/13/20  Yes Truddie Hidden, MD  methocarbamol (ROBAXIN) 500 MG tablet Take 1 tablet (500 mg total) by mouth 2 (two) times daily. 07/13/20  Yes Truddie Hidden, MD  amLODipine (NORVASC) 10 MG tablet Take 10 mg by mouth every evening. 06/20/18   [provider]  carvedilol (COREG) 25 MG tablet Take 1 tablet (25 mg total) by mouth 2 (two) times daily with a meal. 02/02/18   [name], FNP  doxercalciferol (HECTOROL) 0.5 MCG capsule Doxercalciferol (Hectorol) 02/21/20 02/19/21  [provider]  hydrALAZINE (APRESOLINE) 100 MG tablet Take 1 tablet (100 mg total) by mouth 3 (three) times daily. 11/03/19 12/03/19  Darliss Cheney, MD  iron sucrose in sodium chloride 0.9 % 100 mL Iron Sucrose (Venofer) 02/26/20 02/17/21  [provider]  isosorbide mononitrate (IMDUR) 30 MG 24 hr tablet Take by mouth. 12/18/19   [provider]  Methoxy PEG-Epoetin Beta (MIRCERA IJ) Mircera 02/12/20 02/10/21  [provider]  multivitamin (RENA-VIT) TABS tablet Take 1 tablet by mouth daily. 10/20/17   [provider]  polyvinyl alcohol (LIQUIFILM TEARS) 1.4 % ophthalmic solution Place 1 drop into both eyes as needed for dry eyes.    [provider]  sevelamer carbonate (RENVELA) 800 MG tablet Take 2,400 mg by mouth 3 (three) times daily. 01/15/20   [provider]     Allergies    Lisinopril   Review of Systems   Review of Systems A comprehensive review of systems was completed and negative except as noted in HPI.    Physical Exam BP (!) 209/123 (BP Location: Right Arm)   Pulse 75   Temp 98.1 F (36.7 C) (Oral)   Resp (!) 21   SpO2 100%   Physical Exam Vitals and nursing note reviewed.  Constitutional:      Appearance: Normal appearance.  HENT:     Head: Normocephalic and atraumatic.     Nose: Nose normal.     Mouth/Throat:     Mouth: Mucous membranes are moist.  Eyes:     Extraocular Movements: Extraocular movements intact.     Conjunctiva/sclera: Conjunctivae normal.  Cardiovascular:     Rate and Rhythm: Normal rate.  Pulmonary:     Effort: Pulmonary effort is normal.     Breath sounds: Normal breath sounds.  Abdominal:     General: Abdomen is flat.     Palpations: Abdomen is  soft.     Tenderness: There is no abdominal tenderness.  Musculoskeletal:        General: Tenderness (L lower back tender to palpation) present. No swelling. Normal range of motion.     Cervical back: Neck supple.     Comments: Dialysis fistula in RUE with palpable thrill  Skin:    General: Skin is warm  and dry.  Neurological:     General: No focal deficit present.     Mental Status: He is alert.     Cranial Nerves: No cranial nerve deficit.     Sensory: No sensory deficit.     Motor: No weakness.  Psychiatric:        Mood and Affect: Mood normal.     ED Results / Procedures / Treatments   Labs (all labs ordered are listed, but only abnormal results are displayed) Labs Reviewed  CBC WITH DIFFERENTIAL/PLATELET - Abnormal; Notable for the following components:      Result Value   RBC 3.31 (*)    Hemoglobin 10.1 (*)    HCT 31.5 (*)    Lymphs Abs 0.4 (*)    All other components within normal limits  COMPREHENSIVE METABOLIC PANEL - Abnormal; Notable for the following components:   Potassium 6.4 (*)    Glucose, Bld 114 (*)    BUN 97 (*)    Creatinine, Ser 13.96 (*)    Total Protein 6.3 (*)    Alkaline Phosphatase 34 (*)    GFR, Estimated 4 (*)    All other components within normal limits  RENAL FUNCTION PANEL - Abnormal; Notable for the following components:   Potassium 6.3 (*)    Glucose, Bld 108 (*)    BUN 97 (*)    Creatinine, Ser 13.69 (*)    Phosphorus 11.3 (*)    GFR, Estimated 4 (*)    All other components within normal limits  CBC - Abnormal; Notable for the following components:   RBC 3.33 (*)    Hemoglobin 10.3 (*)    HCT 31.6 (*)    All other components within normal limits  RESP PANEL BY RT-PCR (FLU A&B, COVID) ARPGX2  URINALYSIS, ROUTINE W REFLEX MICROSCOPIC  POC SARS CORONAVIRUS 2 AG -  ED  POC SARS CORONAVIRUS 2 AG -  ED    EKG EKG Interpretation  Date/Time:  Sunday July 13 2020 09:45:32 EDT Ventricular Rate:  85 PR Interval:  149 QRS  Duration: 102 QT Interval:  401 QTC Calculation: 477 R Axis:   270 Text Interpretation: Sinus rhythm Incomplete RBBB and LAFB Borderline prolonged QT interval No significant change since last tracing Confirmed by Calvert Cantor [phone]) on 07/13/2020 9:48:16 AM  Radiology DG Chest Port 1 View  Result Date: 07/13/2020 CLINICAL DATA:  Shortness of breath.  Hypoxia. EXAM: PORTABLE CHEST 1 VIEW COMPARISON:  10/30/2019 FINDINGS: The heart is enlarged and stable in configuration. Lungs are free of focal consolidations and pleural effusions. No pulmonary edema. IMPRESSION: Stable cardiomegaly. Electronically Signed   By: Nolon Nations M.D.   On: 07/13/2020 11:53    Procedures Procedures  Medications Ordered in the ED Medications  methocarbamol (ROBAXIN) tablet 500 mg (has no administration in time range)  Chlorhexidine Gluconate Cloth 2 % PADS 6 each (has no administration in time range)  pentafluoroprop-tetrafluoroeth (GEBAUERS) aerosol 1 application (has no administration in time range)  lidocaine (PF) (XYLOCAINE) 1 % injection 5 mL (has no administration in time range)  lidocaine-prilocaine (EMLA) cream 1 application (has no administration in time range)  0.9 %  sodium chloride infusion (has no administration in time range)  0.9 %  sodium chloride infusion (has no administration in time range)  heparin injection 1,000 Units (has no administration in time range)  alteplase (CATHFLO ACTIVASE) injection 2 mg (has no administration in time range)  heparin injection 6,000 Units (has no administration in time range)  heparin sodium (porcine) 1000 UNIT/ML injection (has no administration in time range)  HYDROmorphone (DILAUDID) injection 1 mg (1 mg Intravenous Given 07/13/20 0933)  labetalol (NORMODYNE) injection 20 mg (20 mg Intravenous Given 07/13/20 1105)     MDM Rules/Calculators/A&P MDM Patient here with acute lower back pain, does not appear to be radicular. Will check labs given missed  dialysis and dilaudid for pain.   ED Course  I have reviewed the triage vital signs and the nursing notes.  Pertinent labs & imaging results that were available during my care of the patient were reviewed by me and considered in my medical decision making (see chart for details).  Clinical Course as of 07/13/20 1456  Sun Jul 13, 2020  1014 CBC is at baseline, mild anemia.  [CS]  63 CMP is consistent with ESRD and missed dialysis. BP is also significantly more elevated now. Will give a dose of labetalol and discuss with Nephrology.  [CS]  7209 Patient reports back pain is improved, still having occasional spasms, but between paroxysms of pain he is doing better.  [CS]  1117 Spoke with Dr. Jonnie Finner, Nephrology, who requests CXR to eval pulmonary edema and he will come assess the patient.  [CS]  4709 Per Dr. Jonnie Finner, they plan to dialysize and then back to ED for dispo.  [CS]    Clinical Course User Index [CS] Truddie Hidden, MD    Final Clinical Impression(s) / ED Diagnoses Final diagnoses:  Muscle spasm of back  ESRD on hemodialysis [facility])    Rx / DC Orders ED Discharge Orders          Ordered    HYDROcodone-acetaminophen (NORCO/VICODIN) 5-325 MG tablet  Every 6 hours PRN        07/13/20 1350    methocarbamol (ROBAXIN) 500 MG tablet  2 times daily        07/13/20 1350             Truddie Hidden, MD 07/13/20 1457

## 2020-07-13 NOTE — ED Notes (Signed)
Md messick contacted about pts bp of 200

## 2020-07-13 NOTE — ED Provider Notes (Signed)
Emergency Medicine Provider Triage Evaluation Note  Jason [name]. , a 60 y.o. male  was evaluated in triage.  Pt complains of left low back pain onset 2 days ago.  Pt reports this was atraumatic.  Denies fevers, chills or other complaints.  Pt did not go to dialysis yesterday (Sat) due to the pain and has not been able to get up to take his medications. Denies SOB, but EMS reports SpO2 in the 80s upon their arrival.  Placed on 2L via [place].  Review of Systems  Positive: Low back pain Negative: CP, SOB  Physical Exam  BP (!) 129/114 (BP Location: Left Arm)   Pulse 79   Temp 98.8 F (37.1 C) (Oral)   Resp 19   SpO2 93%  Gen:   Awake, no distress   Resp:  Normal effort  MSK:   Moves extremities without difficulty  Other:  Palpable thrill in the RUE fistula, pt laying in chair - unable to sit up for further exam  Medical Decision Making  Medically screening exam initiated at 6:18 AM.  Appropriate orders placed.  Jason [name]. was informed that the remainder of the evaluation will be completed by another provider, this initial triage assessment does not replace that evaluation, and the importance of remaining in the ED until their evaluation is complete.  Low back pain; hypoxia    Agapito Games 07/13/20 1624    [name], MD 07/13/20 [phone]

## 2020-07-15 LAB — HEPATITIS B SURFACE ANTIBODY, QUANTITATIVE: Hep B S AB Quant (Post): <3.1 m[IU]/mL — ABNORMAL LOW (ref >9.9)

## 2020-07-17 ENCOUNTER — Emergency Department (HOSPITAL_COMMUNITY): Payer: Medicare HMO

## 2020-07-17 ENCOUNTER — Inpatient Hospital Stay (HOSPITAL_COMMUNITY)
Admission: EM | Admit: 2020-07-17 | Discharge: 2020-07-22 | DRG: 291 | Disposition: A | Discharge status: Home / Self Care | Payer: Medicare HMO | Attending: Internal Medicine | Admitting: Internal Medicine

## 2020-07-17 ENCOUNTER — Encounter (HOSPITAL_COMMUNITY): Payer: Self-pay | Admitting: Emergency Medicine

## 2020-07-17 DIAGNOSIS — Z79899 Other long term (current) drug therapy: Secondary | ICD-10-CM

## 2020-07-17 DIAGNOSIS — Z20822 Contact with and (suspected) exposure to covid-19: Secondary | ICD-10-CM | POA: Diagnosis present

## 2020-07-17 DIAGNOSIS — M4640 Discitis, unspecified, site unspecified: Secondary | ICD-10-CM | POA: Diagnosis not present

## 2020-07-17 DIAGNOSIS — Z823 Family history of stroke: Secondary | ICD-10-CM

## 2020-07-17 DIAGNOSIS — Z9115 Patient's noncompliance with renal dialysis: Secondary | ICD-10-CM

## 2020-07-17 DIAGNOSIS — Z992 Dependence on renal dialysis: Secondary | ICD-10-CM | POA: Diagnosis not present

## 2020-07-17 DIAGNOSIS — I120 Hypertensive chronic kidney disease with stage 5 chronic kidney disease or end stage renal disease: Secondary | ICD-10-CM | POA: Diagnosis not present

## 2020-07-17 DIAGNOSIS — M545 Low back pain, unspecified: Secondary | ICD-10-CM

## 2020-07-17 DIAGNOSIS — I5033 Acute on chronic diastolic (congestive) heart failure: Secondary | ICD-10-CM | POA: Diagnosis present

## 2020-07-17 DIAGNOSIS — Z833 Family history of diabetes mellitus: Secondary | ICD-10-CM | POA: Diagnosis not present

## 2020-07-17 DIAGNOSIS — J811 Chronic pulmonary edema: Secondary | ICD-10-CM | POA: Diagnosis not present

## 2020-07-17 DIAGNOSIS — M869 Osteomyelitis, unspecified: Secondary | ICD-10-CM | POA: Diagnosis present

## 2020-07-17 DIAGNOSIS — M5459 Other low back pain: Secondary | ICD-10-CM | POA: Diagnosis not present

## 2020-07-17 DIAGNOSIS — D631 Anemia in chronic kidney disease: Secondary | ICD-10-CM | POA: Diagnosis present

## 2020-07-17 DIAGNOSIS — J9601 Acute respiratory failure with hypoxia: Secondary | ICD-10-CM | POA: Diagnosis not present

## 2020-07-17 DIAGNOSIS — E8779 Other fluid overload: Secondary | ICD-10-CM

## 2020-07-17 DIAGNOSIS — N2581 Secondary hyperparathyroidism of renal origin: Secondary | ICD-10-CM | POA: Diagnosis present

## 2020-07-17 DIAGNOSIS — G8929 Other chronic pain: Secondary | ICD-10-CM | POA: Diagnosis present

## 2020-07-17 DIAGNOSIS — Z87891 Personal history of nicotine dependence: Secondary | ICD-10-CM | POA: Diagnosis not present

## 2020-07-17 DIAGNOSIS — M549 Dorsalgia, unspecified: Secondary | ICD-10-CM | POA: Diagnosis not present

## 2020-07-17 DIAGNOSIS — I132 Hypertensive heart and chronic kidney disease with heart failure and with stage 5 chronic kidney disease, or end stage renal disease: Principal | ICD-10-CM | POA: Diagnosis present

## 2020-07-17 DIAGNOSIS — R059 Cough, unspecified: Secondary | ICD-10-CM | POA: Diagnosis not present

## 2020-07-17 DIAGNOSIS — Z888 Allergy status to other drugs, medicaments and biological substances status: Secondary | ICD-10-CM | POA: Diagnosis not present

## 2020-07-17 DIAGNOSIS — I5032 Chronic diastolic (congestive) heart failure: Secondary | ICD-10-CM | POA: Diagnosis present

## 2020-07-17 DIAGNOSIS — I10 Essential (primary) hypertension: Secondary | ICD-10-CM | POA: Diagnosis not present

## 2020-07-17 DIAGNOSIS — I739 Peripheral vascular disease, unspecified: Secondary | ICD-10-CM | POA: Diagnosis present

## 2020-07-17 DIAGNOSIS — M4646 Discitis, unspecified, lumbar region: Secondary | ICD-10-CM | POA: Diagnosis not present

## 2020-07-17 DIAGNOSIS — Z8249 Family history of ischemic heart disease and other diseases of the circulatory system: Secondary | ICD-10-CM

## 2020-07-17 DIAGNOSIS — R0600 Dyspnea, unspecified: Secondary | ICD-10-CM | POA: Diagnosis not present

## 2020-07-17 DIAGNOSIS — N186 End stage renal disease: Secondary | ICD-10-CM | POA: Diagnosis not present

## 2020-07-17 DIAGNOSIS — E875 Hyperkalemia: Secondary | ICD-10-CM

## 2020-07-17 DIAGNOSIS — R531 Weakness: Secondary | ICD-10-CM

## 2020-07-17 DIAGNOSIS — I5042 Chronic combined systolic (congestive) and diastolic (congestive) heart failure: Secondary | ICD-10-CM | POA: Diagnosis not present

## 2020-07-17 DIAGNOSIS — I517 Cardiomegaly: Secondary | ICD-10-CM | POA: Diagnosis not present

## 2020-07-17 DIAGNOSIS — N250 Renal osteodystrophy: Secondary | ICD-10-CM | POA: Diagnosis not present

## 2020-07-17 DIAGNOSIS — E8770 Fluid overload, unspecified: Secondary | ICD-10-CM | POA: Diagnosis present

## 2020-07-17 DIAGNOSIS — R0902 Hypoxemia: Secondary | ICD-10-CM | POA: Diagnosis not present

## 2020-07-17 DIAGNOSIS — R0602 Shortness of breath: Secondary | ICD-10-CM | POA: Diagnosis not present

## 2020-07-17 LAB — COMPREHENSIVE METABOLIC PANEL
ALT: 23 U/L (ref 0–44)
AST: 21 U/L (ref 15–41)
Albumin: 3.3 g/dL — ABNORMAL LOW (ref 3.5–5.0)
Alkaline Phosphatase: 35 U/L — ABNORMAL LOW (ref 38–126)
Anion gap: 16 — ABNORMAL HIGH (ref 5–15)
BUN: 126 mg/dL — ABNORMAL HIGH (ref 6–20)
CO2: 23 mmol/L (ref 22–32)
Calcium: 8.4 mg/dL — ABNORMAL LOW (ref 8.9–10.3)
Chloride: 97 mmol/L — ABNORMAL LOW (ref 98–111)
Creatinine, Ser: 17.08 mg/dL — ABNORMAL HIGH (ref 0.61–1.24)
GFR, Estimated: 3 mL/min — ABNORMAL LOW (ref >60)
Glucose, Bld: 114 mg/dL — ABNORMAL HIGH (ref 70–99)
Potassium: 6.1 mmol/L — ABNORMAL HIGH (ref 3.5–5.1)
Sodium: 136 mmol/L (ref 135–145)
Total Bilirubin: 0.8 mg/dL (ref 0.3–1.2)
Total Protein: 6.4 g/dL — ABNORMAL LOW (ref 6.5–8.1)

## 2020-07-17 LAB — CBC WITH DIFFERENTIAL/PLATELET
Abs Immature Granulocytes: 0.02 10*3/uL (ref 0.00–0.07)
Basophils Absolute: 0 10*3/uL (ref 0.0–0.1)
Basophils Relative: 1 %
Eosinophils Absolute: 0.4 10*3/uL (ref 0.0–0.5)
Eosinophils Relative: 8 %
HCT: 29 % — ABNORMAL LOW (ref 39.0–52.0)
Hemoglobin: 9.5 g/dL — ABNORMAL LOW (ref 13.0–17.0)
Immature Granulocytes: 0 %
Lymphocytes Relative: 8 %
Lymphs Abs: 0.4 10*3/uL — ABNORMAL LOW (ref 0.7–4.0)
MCH: 30.5 pg (ref 26.0–34.0)
MCHC: 32.8 g/dL (ref 30.0–36.0)
MCV: 93.2 fL (ref 80.0–100.0)
Monocytes Absolute: 0.4 10*3/uL (ref 0.1–1.0)
Monocytes Relative: 8 %
Neutro Abs: 3.6 10*3/uL (ref 1.7–7.7)
Neutrophils Relative %: 75 %
Platelets: 201 10*3/uL (ref 150–400)
RBC: 3.11 MIL/uL — ABNORMAL LOW (ref 4.22–5.81)
RDW: 13.1 % (ref 11.5–15.5)
WBC: 4.7 10*3/uL (ref 4.0–10.5)
nRBC: 0 % (ref 0.0–0.2)

## 2020-07-17 LAB — BRAIN NATRIURETIC PEPTIDE: B Natriuretic Peptide: 3654.1 pg/mL — ABNORMAL HIGH (ref 0.0–100.0)

## 2020-07-17 LAB — RESP PANEL BY RT-PCR (FLU A&B, COVID) ARPGX2
Influenza A by PCR: NEGATIVE
Influenza B by PCR: NEGATIVE
SARS Coronavirus 2 by RT PCR: NEGATIVE

## 2020-07-17 IMAGING — CR DG CHEST 2V
2 series · 2 of 2 positions shown · non-contrast
Comparison: [DATE].

CLINICAL DATA: 60-year-old male with shortness of breath and cough
for 1 week.

EXAM:
CHEST - 2 VIEW

[chest lat]
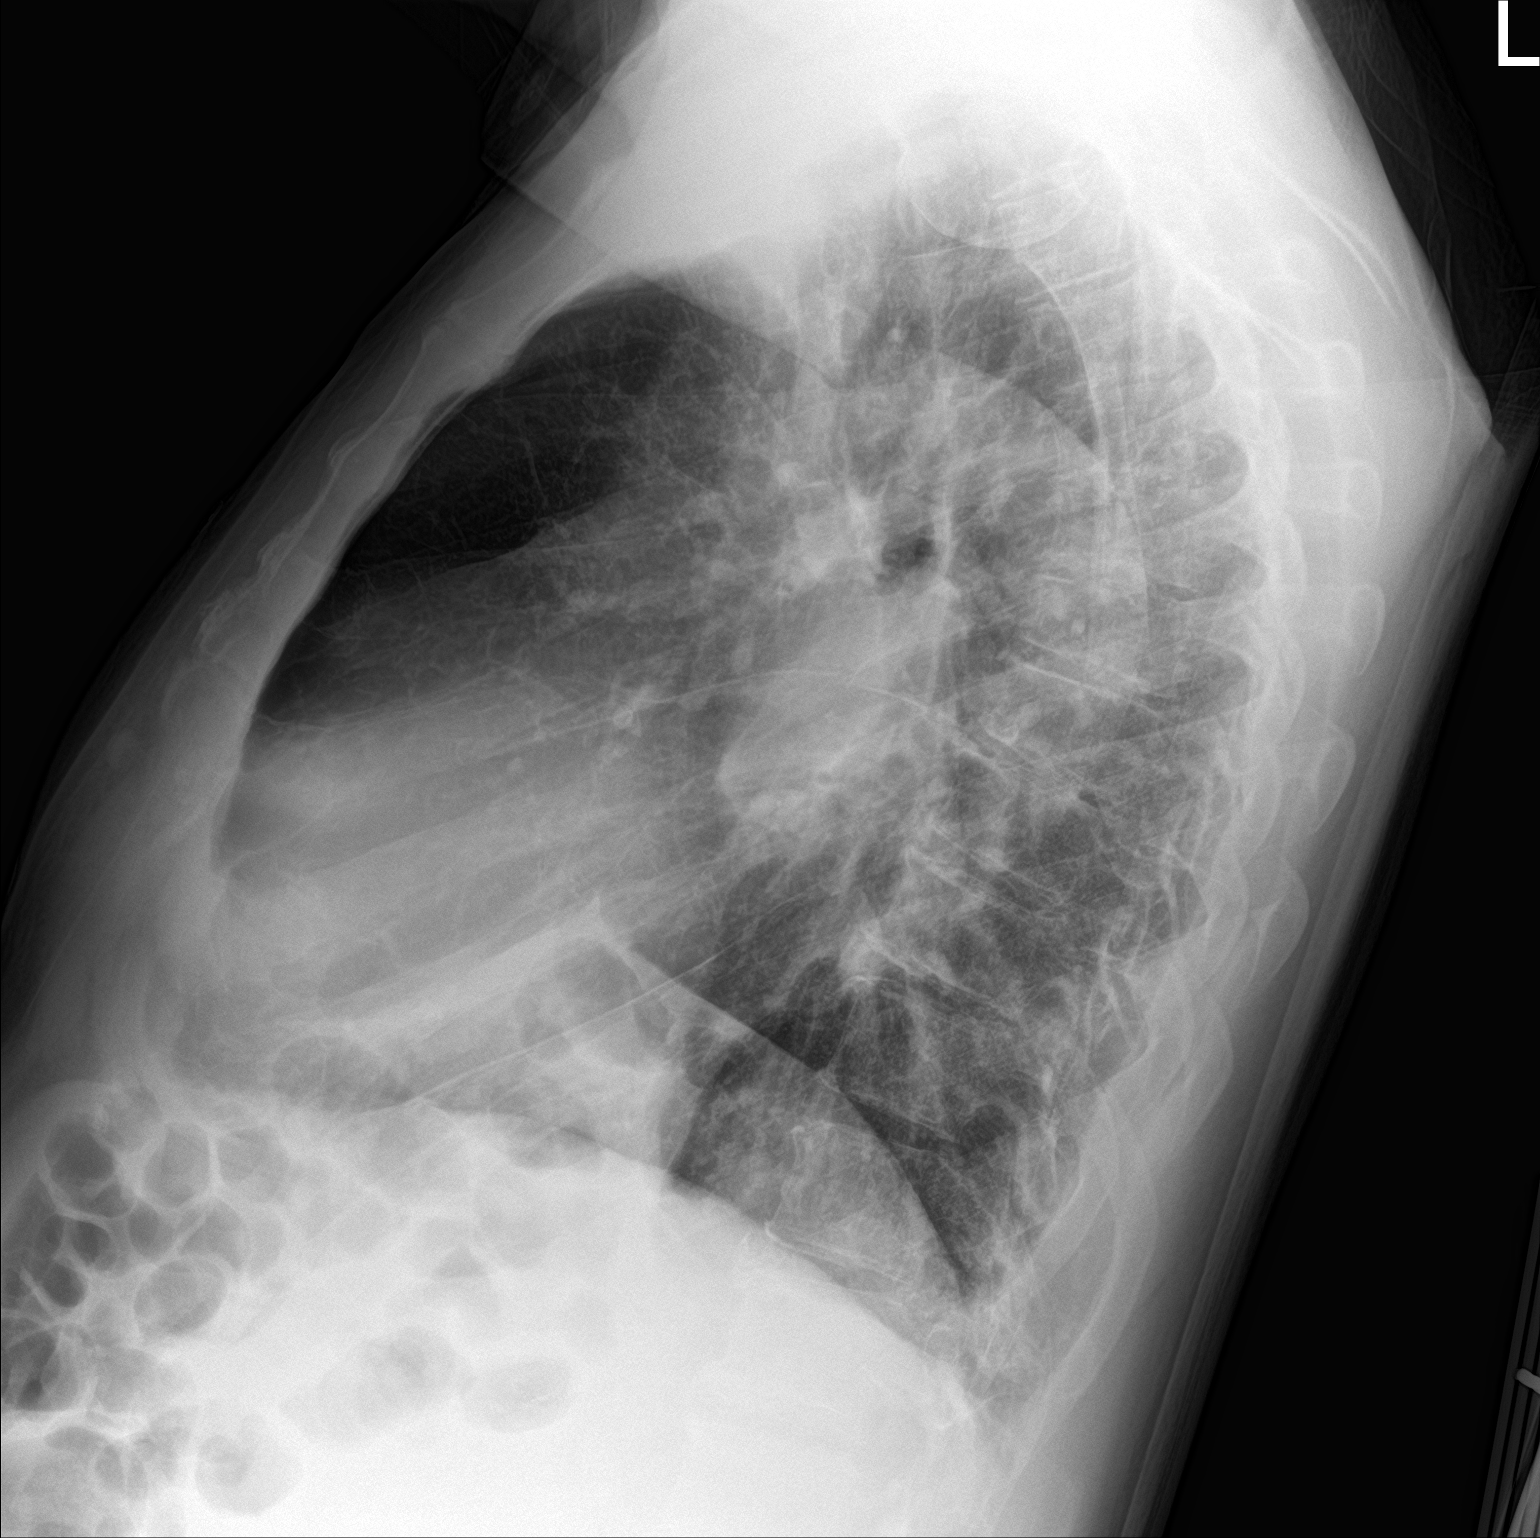

[chest ap]
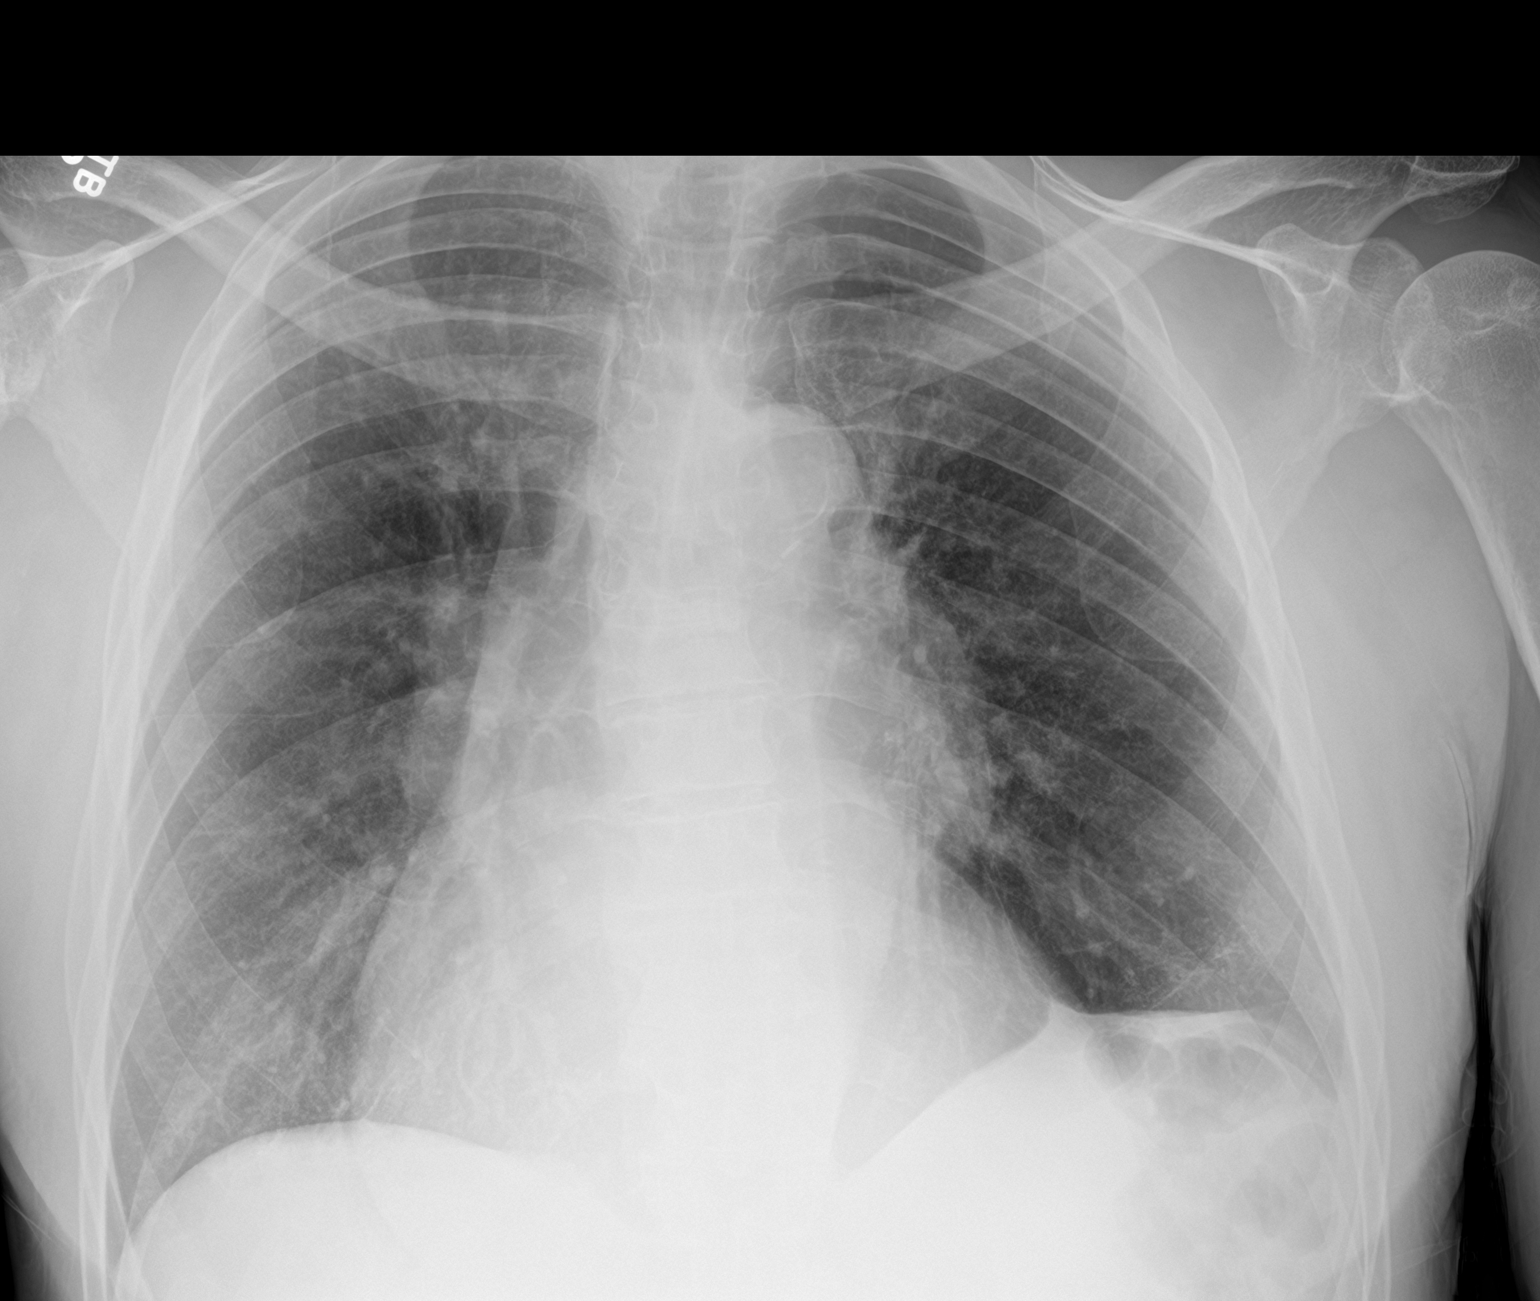

[2 of 2 positions shown; findings below may reference images not displayed]

FINDINGS: Image mildly rotated to the LEFT. Accounting for this
cardiomediastinal contours and hilar structures are stable.

No lobar consolidation or sign of pleural effusion. Signs of
vascular crowding/pulmonary vascular congestion. No visible
pneumothorax. No effusion.

On limited assessment no acute skeletal process.
IMPRESSION: Cardiomegaly with pulmonary vascular congestion as before.

## 2020-07-17 IMAGING — CR DG LUMBAR SPINE COMPLETE 4+V
5 series · 5 of 5 positions shown · non-contrast
Comparison: None.

CLINICAL DATA: Back pain

EXAM:
LUMBAR SPINE - COMPLETE 4+ VIEW

[l-spine ap]
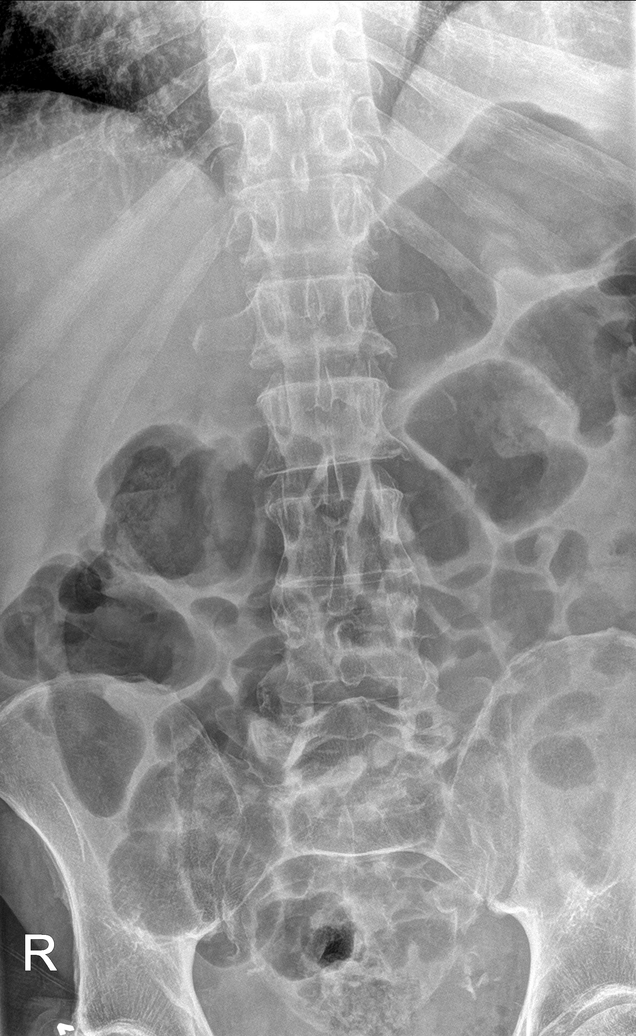

[l-spine obl (1 of 2)]
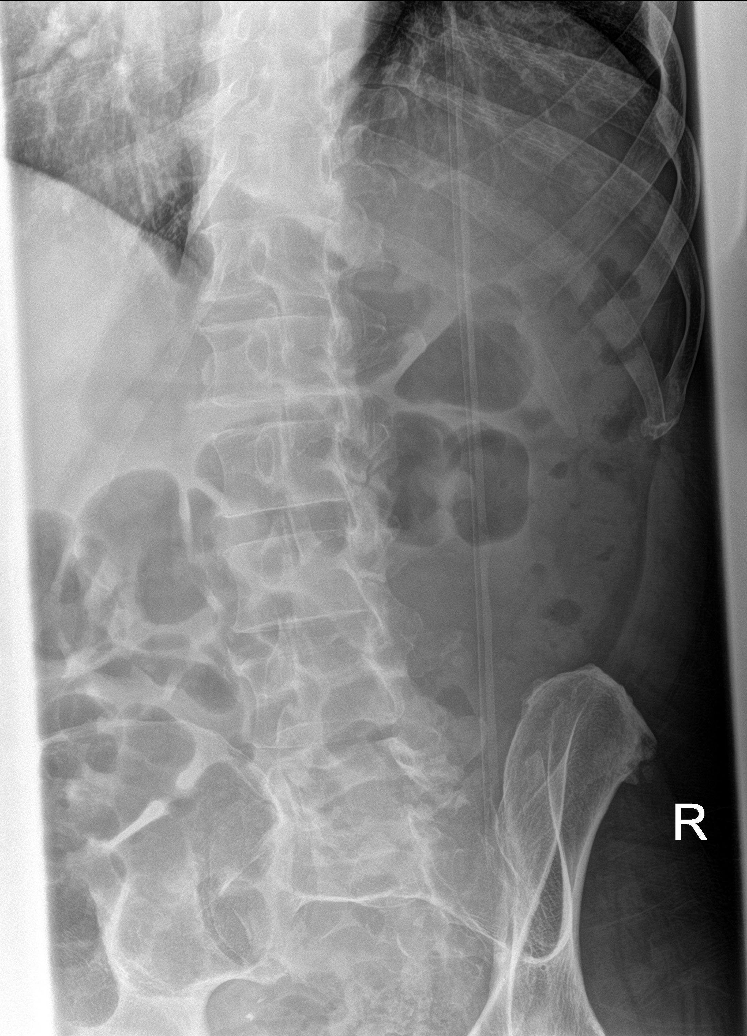

[l-spine obl (2 of 2)]
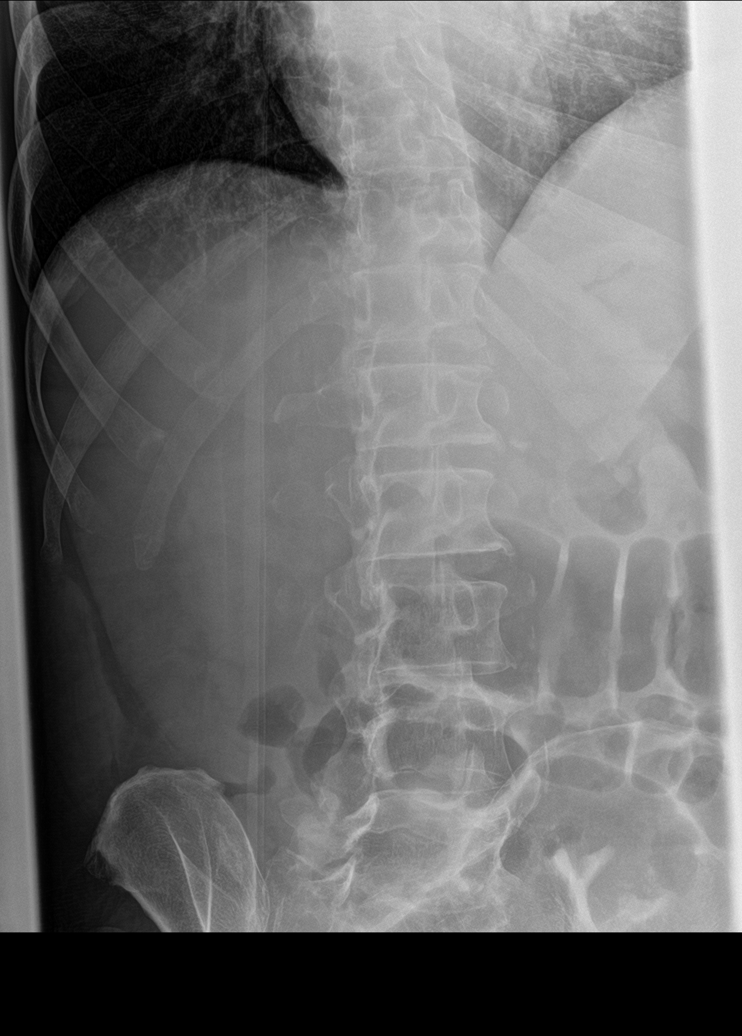

[l-spine lat]
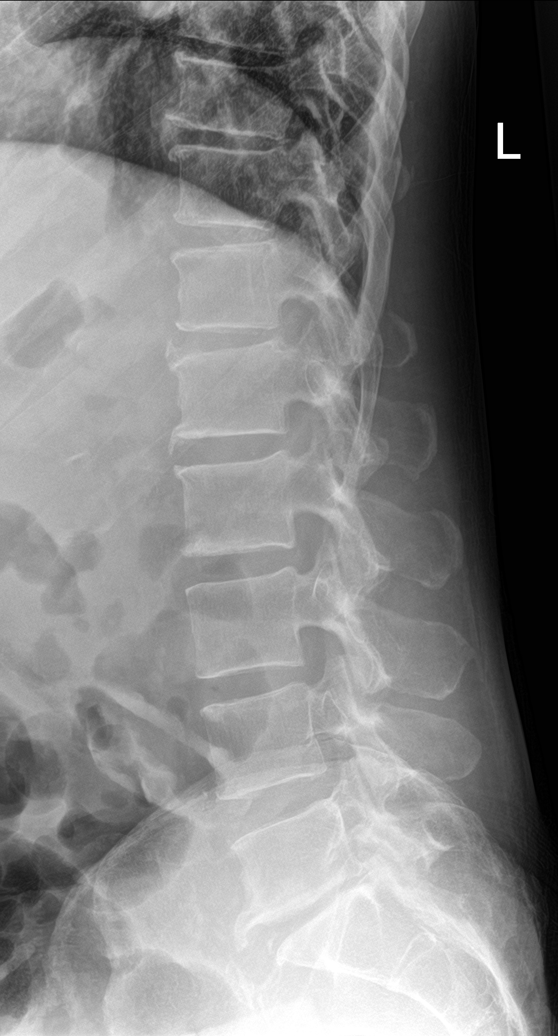

[l-spine spot]
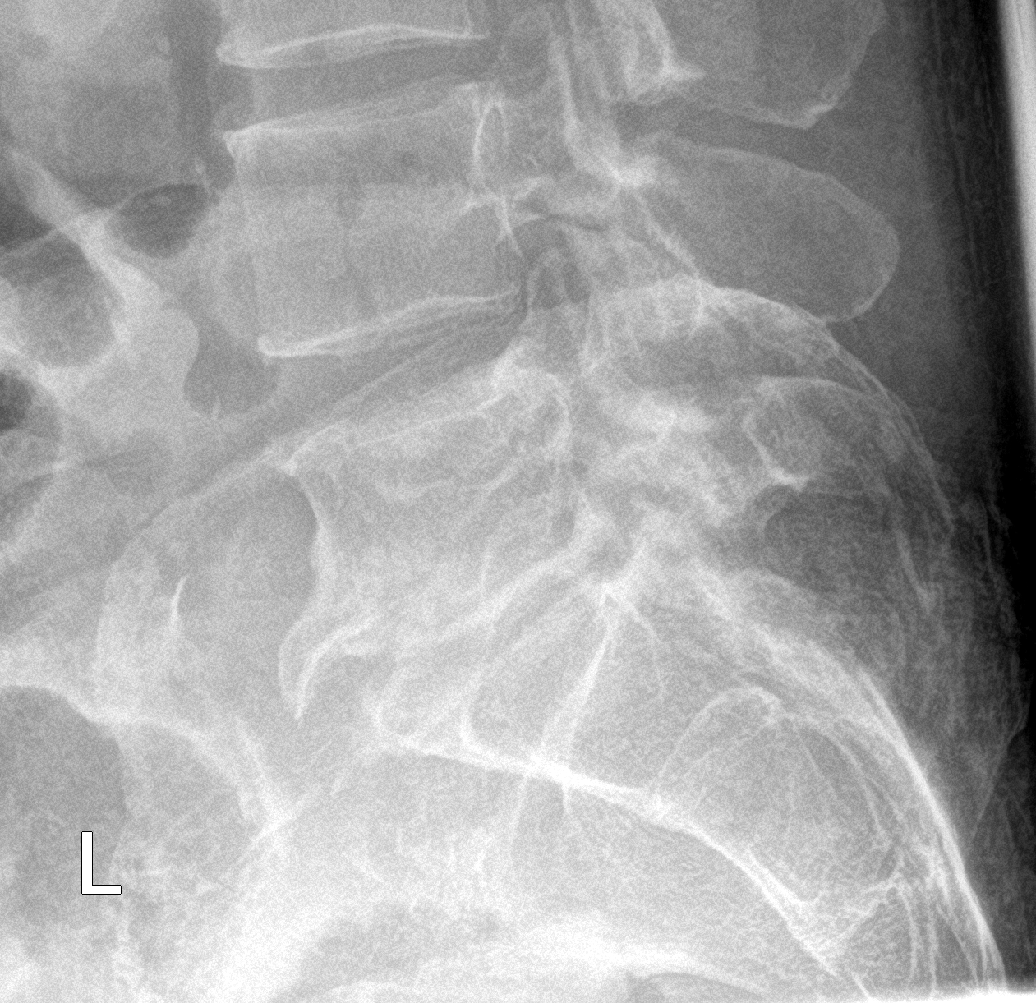

[5 of 5 positions shown; findings below may reference images not displayed]

FINDINGS: Straightening of the lumbar spine. Vertebral body heights are
maintained. Moderate disc space narrowing and degenerative change at
L5-S1. Facet degenerative changes of the lower lumbar spine.
IMPRESSION: Straightening of the lumbar spine with moderate to marked
degenerative change at L5-S1

## 2020-07-17 MED ORDER — SEVELAMER CARBONATE 800 MG PO TABS
2400.0000 mg | ORAL_TABLET | Freq: Three times a day (TID) | ORAL | Status: DC
  Administered 2020-07-18 (×3): 2400 mg via ORAL
  Administered 2020-07-19 (×2): 4000 mg via ORAL
  Administered 2020-07-20: 3200 mg via ORAL
  Administered 2020-07-20: 2400 mg via ORAL
  Administered 2020-07-20: 3200 mg via ORAL
  Administered 2020-07-21: 4000 mg via ORAL
  Administered 2020-07-21: 3200 mg via ORAL
  Administered 2020-07-22: 2400 mg via ORAL
  Filled 2020-07-17: qty 5
  Filled 2020-07-17: qty 3
  Filled 2020-07-17: qty 5
  Filled 2020-07-17: qty 3
  Filled 2020-07-17 (×2): qty 5
  Filled 2020-07-17: qty 3
  Filled 2020-07-17: qty 5
  Filled 2020-07-17: qty 4
  Filled 2020-07-17: qty 3

## 2020-07-17 MED ORDER — POLYVINYL ALCOHOL 1.4 % OP SOLN: 1.0000 [drp] | OPHTHALMIC | Status: DC | PRN

## 2020-07-17 MED ORDER — LIDOCAINE HCL (PF) 1 % IJ SOLN: 5.0000 mL | INTRAMUSCULAR | Status: DC | PRN

## 2020-07-17 MED ORDER — AMLODIPINE BESYLATE 5 MG PO TABS: 10.0000 mg | ORAL_TABLET | Freq: Every evening | ORAL | Status: DC

## 2020-07-17 MED ORDER — SODIUM CHLORIDE 0.9 % IV SOLN: 100.0000 mL | INTRAVENOUS | Status: DC | PRN

## 2020-07-17 MED ORDER — CHLORHEXIDINE GLUCONATE CLOTH 2 % EX PADS
6.0000 | MEDICATED_PAD | Freq: Every day | CUTANEOUS | Status: DC
  Administered 2020-07-18 – 2020-07-21 (×4): 6 via TOPICAL

## 2020-07-17 MED ORDER — ALTEPLASE 2 MG IJ SOLR: 2.0000 mg | Freq: Once | INTRAMUSCULAR | Status: DC | PRN

## 2020-07-17 MED ORDER — AMLODIPINE BESYLATE 10 MG PO TABS
10.0000 mg | ORAL_TABLET | Freq: Every evening | ORAL | Status: DC
  Administered 2020-07-18 – 2020-07-21 (×4): 10 mg via ORAL
  Filled 2020-07-17 (×4): qty 1

## 2020-07-17 MED ORDER — METHOCARBAMOL 500 MG PO TABS: 500.0000 mg | ORAL_TABLET | Freq: Two times a day (BID) | ORAL | Status: DC

## 2020-07-17 MED ORDER — HYDROCODONE-ACETAMINOPHEN 5-325 MG PO TABS
1.0000 | ORAL_TABLET | Freq: Four times a day (QID) | ORAL | Status: DC | PRN
  Administered 2020-07-18 – 2020-07-22 (×5): 1 via ORAL
  Filled 2020-07-17 (×5): qty 1

## 2020-07-17 MED ORDER — HYDRALAZINE HCL 50 MG PO TABS
100.0000 mg | ORAL_TABLET | Freq: Three times a day (TID) | ORAL | Status: DC
  Administered 2020-07-18 – 2020-07-22 (×12): 100 mg via ORAL
  Filled 2020-07-17 (×13): qty 2

## 2020-07-17 MED ORDER — ONDANSETRON HCL 4 MG/2ML IJ SOLN: 4.0000 mg | Freq: Four times a day (QID) | INTRAMUSCULAR | Status: DC | PRN

## 2020-07-17 MED ORDER — LIDOCAINE-PRILOCAINE 2.5-2.5 % EX CREA
1.0000 "application " | TOPICAL_CREAM | CUTANEOUS | Status: DC | PRN
  Filled 2020-07-17: qty 5

## 2020-07-17 MED ORDER — ONDANSETRON HCL 4 MG PO TABS: 4.0000 mg | ORAL_TABLET | Freq: Four times a day (QID) | ORAL | Status: DC | PRN

## 2020-07-17 MED ORDER — SODIUM ZIRCONIUM CYCLOSILICATE 10 G PO PACK: 10.0000 g | PACK | Freq: Once | ORAL | Status: DC

## 2020-07-17 MED ORDER — HEPARIN SODIUM (PORCINE) 1000 UNIT/ML DIALYSIS
1000.0000 [IU] | INTRAMUSCULAR | Status: DC | PRN
  Filled 2020-07-17: qty 1

## 2020-07-17 MED ORDER — SODIUM ZIRCONIUM CYCLOSILICATE 10 G PO PACK
10.0000 g | PACK | Freq: Every day | ORAL | Status: DC
  Administered 2020-07-17 – 2020-07-18 (×2): 10 g via ORAL
  Filled 2020-07-17 (×2): qty 1

## 2020-07-17 MED ORDER — ACETAMINOPHEN 650 MG RE SUPP: 650.0000 mg | Freq: Four times a day (QID) | RECTAL | Status: DC | PRN

## 2020-07-17 MED ORDER — PENTAFLUOROPROP-TETRAFLUOROETH EX AERO
1.0000 "application " | INHALATION_SPRAY | CUTANEOUS | Status: DC | PRN
  Filled 2020-07-17: qty 116

## 2020-07-17 MED ORDER — RENA-VITE PO TABS
1.0000 | ORAL_TABLET | Freq: Every day | ORAL | Status: DC
  Administered 2020-07-18 – 2020-07-21 (×4): 1 via ORAL
  Filled 2020-07-17 (×5): qty 1

## 2020-07-17 MED ORDER — HEPARIN SODIUM (PORCINE) 5000 UNIT/ML IJ SOLN
5000.0000 [IU] | Freq: Three times a day (TID) | INTRAMUSCULAR | Status: DC
  Administered 2020-07-18 – 2020-07-20 (×7): 5000 [IU] via SUBCUTANEOUS
  Filled 2020-07-17 (×6): qty 1

## 2020-07-17 MED ORDER — ACETAMINOPHEN 325 MG PO TABS: 650.0000 mg | ORAL_TABLET | Freq: Four times a day (QID) | ORAL | Status: DC | PRN

## 2020-07-17 MED ORDER — CARVEDILOL 25 MG PO TABS
25.0000 mg | ORAL_TABLET | Freq: Two times a day (BID) | ORAL | Status: DC
  Administered 2020-07-18 – 2020-07-22 (×8): 25 mg via ORAL
  Filled 2020-07-17 (×8): qty 1

## 2020-07-17 MED ORDER — METHOCARBAMOL 500 MG PO TABS
500.0000 mg | ORAL_TABLET | Freq: Four times a day (QID) | ORAL | Status: DC | PRN
  Filled 2020-07-17: qty 1

## 2020-07-17 MED ORDER — OXYCODONE-ACETAMINOPHEN 5-325 MG PO TABS
1.0000 | ORAL_TABLET | Freq: Once | ORAL | Status: AC
  Administered 2020-07-17: 1 via ORAL
  Filled 2020-07-17: qty 1

## 2020-07-17 NOTE — H&P (Addendum)
History and Physical    Jason [name]. SEG:[id number] DOB: 06/09/1960 DOA: 07/17/2020  PCP: [name], FNP (Inactive)  Patient coming from: Home  I have personally briefly reviewed patient's old medical records in [place]  Chief Complaint: Generalized weakness  HPI: Jason [name]. is a 60 y.o. male with medical history significant of ESRD on TTS dialysis, HTN, mod PAH, HFpEF (EF 50-55%, grade 1 DD in Feb 2021).  Pt presents to ED with generalized weakness, low back pain.  Back pain ongoing for about a week now.  Missed dialysis on Sat due to low back pain had urgent dialysis in ED on Sun 7/3.  Pt missed additional dialysis session on Tues this week.  Presents to ED today for missed dialysis, generalized weakness, ongoing low back pain.  No Saddle anesthesias, pain into legs (stays in lumbar region).  No CP, does have SOB though.  SOB worse over past 2 days since missing dialysis.   ED Course: K 6.1, BNP 3654.  BUN 126.  CXR shows pulm edema.   Review of Systems: As per HPI, otherwise all review of systems negative.  Past Medical History:  Diagnosis Date   A-V fistula ([place])    Right arm   Abdominal hernia    AKI (acute kidney injury) ([place]) 01/2015   Anasarca    Anemia    Asthma    as a child   Bilateral inguinal hernia    Bilateral pleural effusion 01/2017   Cellulitis 01/16/2017   Bilateral lower extremity   CHF (congestive heart failure) (HCC)    Chronic venous insufficiency    Concentric left ventricular hypertrophy 08/17/2017   Moderated, noted on ECHO   Diastolic dysfunction 73/71/0626   noted on ECHO   Elevated LFTs    per patient, resolved   End stage renal disease ([place])    Dialysis T/Th/Sa  Started 02/2017/pt is waiting for a kidney transplant   H/O right inguinal hernia repair 11/2017   History of colonoscopy 03/20/2018   History of elevated PSA 10/2017   Hypertension    Pneumonia    Pulmonary hypertension ([place])    Moderate    Venous stasis ulcer ([place])    bil legs/ healed up now per pt (03/06/18)    Past Surgical History:  Procedure Laterality Date   AV FISTULA PLACEMENT Right 01/20/2017   Procedure: ARTERIOVENOUS (AV) FISTULA CREATION;  Surgeon: Angelia Mould, MD;  Location: [facility];  Service: Vascular;  Laterality: Right;   HERNIA REPAIR     INSERTION OF DIALYSIS CATHETER Right 01/20/2017   Procedure: INSERTION OF DIALYSIS CATHETER;  Surgeon: Angelia Mould, MD;  Location: [facility];  Service: Vascular;  Laterality: Right;   INSERTION OF MESH N/A 11/18/2017   Procedure: INSERTION OF MESH;  Surgeon: Michael Boston, MD;  Location: [facility];  Service: General;  Laterality: N/A;   IR FLUORO GUIDE CV LINE RIGHT  01/18/2017   IR US GUIDE VASC ACCESS RIGHT  01/18/2017   LAPAROSCOPIC INGUINAL HERNIA WITH UMBILICAL HERNIA Bilateral 11/18/2017   Procedure: LAPAROSCOPIC BILATERAL INGUINAL HERNIA REPAIR WITH UMBILICAL HERNIA REPAIR WITH MESH;  Surgeon: Michael Boston, MD;  Location: [facility];  Service: General;  Laterality: Bilateral;   TOE SURGERY     right fracture in big toe   TONSILLECTOMY       reports that he quit smoking about 25 years ago. He has never used smokeless tobacco. He reports previous alcohol use. He reports previous drug  use.  Allergies  Allergen Reactions   Lisinopril Cough    Family History  Problem Relation Age of Onset   Heart attack Mother 43       CABG hx   Heart disease Mother    Hypertension Mother    Stroke Mother    Diabetes Father    Healthy Brother    Prostate cancer Maternal Grandfather    Colon cancer Neg Hx    Colon polyps Neg Hx    Esophageal cancer Neg Hx    Stomach cancer Neg Hx    Rectal cancer Neg Hx      Prior to Admission medications   Medication Sig Start Date End Date Taking? Authorizing Provider  amLODipine (NORVASC) 10 MG tablet Take 10 mg by mouth every evening. 06/20/18   [provider]  carvedilol (COREG) 25 MG tablet Take 1 tablet (25 mg total)  by mouth 2 (two) times daily with a meal. 02/02/18   [name], FNP  doxercalciferol (HECTOROL) 0.5 MCG capsule Doxercalciferol (Hectorol) 02/21/20 02/19/21  [provider]  hydrALAZINE (APRESOLINE) 100 MG tablet Take 1 tablet (100 mg total) by mouth 3 (three) times daily. 11/03/19 12/03/19  Darliss Cheney, MD  HYDROcodone-acetaminophen (NORCO/VICODIN) 5-325 MG tablet Take 1 tablet by mouth every 6 (six) hours as needed for severe pain. 07/13/20   Truddie Hidden, MD  iron sucrose in sodium chloride 0.9 % 100 mL Iron Sucrose (Venofer) 02/26/20 02/17/21  [provider]  isosorbide mononitrate (IMDUR) 30 MG 24 hr tablet Take by mouth. 12/18/19   [provider]  methocarbamol (ROBAXIN) 500 MG tablet Take 1 tablet (500 mg total) by mouth 2 (two) times daily. 07/13/20   Truddie Hidden, MD  Methoxy PEG-Epoetin Beta (MIRCERA IJ) Mircera 02/12/20 02/10/21  [provider]  multivitamin (RENA-VIT) TABS tablet Take 1 tablet by mouth daily. 10/20/17   [provider]  polyvinyl alcohol (LIQUIFILM TEARS) 1.4 % ophthalmic solution Place 1 drop into both eyes as needed for dry eyes.    [provider]  sevelamer carbonate (RENVELA) 800 MG tablet Take 2,400 mg by mouth 3 (three) times daily. 01/15/20   [provider]    Physical Exam: Vitals:   07/17/20 1158 07/17/20 1517 07/17/20 1811  BP: (!) 170/100 (!) 200/109 (!) 180/100  Pulse: 66 73 66  Resp: 18 15 16   Temp: 98.5 F (36.9 C)    TempSrc: Oral    SpO2: 92% 95% 99%    Constitutional: NAD, calm, comfortable Eyes: PERRL, lids and conjunctivae normal ENMT: Mucous membranes are moist. Posterior pharynx clear of any exudate or lesions.Normal dentition.  Neck: normal, supple, no masses, no thyromegaly Respiratory: clear to auscultation bilaterally, no wheezing, no crackles. Normal respiratory effort. No accessory muscle use.  Cardiovascular: Regular rate and rhythm, no murmurs / rubs /  gallops. No extremity edema. 2+ pedal pulses. No carotid bruits.  Abdomen: no tenderness, no masses palpated. No hepatosplenomegaly. Bowel sounds positive.  Musculoskeletal: no clubbing / cyanosis. No joint deformity upper and lower extremities. Good ROM, no contractures. Normal muscle tone.  Skin: no rashes, lesions, ulcers. No induration Neurologic: CN 2-12 grossly intact. Sensation intact, DTR normal. Strength 5/5 in all 4.  Psychiatric: Normal judgment and insight. Alert and oriented x 3. Normal mood.    Labs on Admission: I have personally reviewed following labs and imaging studies  CBC: Recent Labs  Lab 07/13/20 0935 07/13/20 1153 07/17/20 1201  WBC 6.8 6.6 4.7  NEUTROABS 5.6  --  3.6  HGB 10.1* 10.3* 9.5*  HCT 31.5* 31.6* 29.0*  MCV 95.2 94.9 93.2  PLT 220 200 381   Basic Metabolic Panel: Recent Labs  Lab 07/13/20 0935 07/13/20 1153 07/17/20 1201  NA 138 136 136  K 6.4* 6.3* 6.1*  CL 99 99 97*  CO2 24 23 23   GLUCOSE 114* 108* 114*  BUN 97* 97* 126*  CREATININE 13.96* 13.69* 17.08*  CALCIUM 9.1 9.1 8.4*  PHOS  --  11.3*  --    GFR: CrCl cannot be calculated (Unknown ideal weight.). Liver Function Tests: Recent Labs  Lab 07/13/20 0935 07/13/20 1153 07/17/20 1201  AST 19  --  21  ALT 21  --  23  ALKPHOS 34*  --  35*  BILITOT 0.7  --  0.8  PROT 6.3*  --  6.4*  ALBUMIN 3.5 3.5 3.3*   No results for input(s): LIPASE, AMYLASE in the last 168 hours. No results for input(s): AMMONIA in the last 168 hours. Coagulation Profile: No results for input(s): INR, PROTIME in the last 168 hours. Cardiac Enzymes: No results for input(s): CKTOTAL, CKMB, CKMBINDEX, TROPONINI in the last 168 hours. BNP (last 3 results) No results for input(s): PROBNP in the last 8760 hours. HbA1C: No results for input(s): HGBA1C in the last 72 hours. CBG: No results for input(s): GLUCAP in the last 168 hours. Lipid Profile: No results for input(s): CHOL, HDL, LDLCALC, TRIG,  CHOLHDL, LDLDIRECT in the last 72 hours. Thyroid Function Tests: No results for input(s): TSH, T4TOTAL, FREET4, T3FREE, THYROIDAB in the last 72 hours. Anemia Panel: No results for input(s): VITAMINB12, FOLATE, FERRITIN, TIBC, IRON, RETICCTPCT in the last 72 hours. Urine analysis:    Component Value Date/Time   COLORURINE YELLOW 10/30/2019 2335   APPEARANCEUR HAZY (A) 10/30/2019 2335   LABSPEC 1.013 10/30/2019 2335   PHURINE 7.0 10/30/2019 2335   GLUCOSEU 50 (A) 10/30/2019 2335   HGBUR NEGATIVE 10/30/2019 2335   BILIRUBINUR NEGATIVE 10/30/2019 2335   BILIRUBINUR Negative 03/19/2019 1354   KETONESUR 5 (A) 10/30/2019 2335   PROTEINUR >=300 (A) 10/30/2019 2335   UROBILINOGEN 0.2 03/19/2019 1354   UROBILINOGEN 0.2 02/26/2015 1028   NITRITE NEGATIVE 10/30/2019 2335   LEUKOCYTESUR NEGATIVE 10/30/2019 2335    Radiological Exams on Admission: DG Chest 2 View  Result Date: 07/17/2020 CLINICAL DATA:  60 year old male with shortness of breath and cough for 1 week. EXAM: CHEST - 2 VIEW COMPARISON:  July 13, 2020. FINDINGS: Image mildly rotated to the LEFT. Accounting for this cardiomediastinal contours and hilar structures are stable. No lobar consolidation or sign of pleural effusion. Signs of vascular crowding/pulmonary vascular congestion. No visible pneumothorax. No effusion. On limited assessment no acute skeletal process. IMPRESSION: Cardiomegaly with pulmonary vascular congestion as before. Electronically Signed   By: Zetta Bills M.D.   On: 07/17/2020 12:54   DG Lumbar Spine Complete  Result Date: 07/17/2020 CLINICAL DATA:  Back pain EXAM: LUMBAR SPINE - COMPLETE 4+ VIEW COMPARISON:  None. FINDINGS: Straightening of the lumbar spine. Vertebral body heights are maintained. Moderate disc space narrowing and degenerative change at L5-S1. Facet degenerative changes of the lower lumbar spine. IMPRESSION: Straightening of the lumbar spine with moderate to marked degenerative change at L5-S1  Electronically Signed   By: Donavan Foil M.D.   On: 07/17/2020 21:48    EKG: Independently reviewed.  New TWI in inferior leads compared to 4 days ago.  LVH.  Assessment/Plan Principal Problem:   ESRD (end stage renal disease) on  dialysis [facility]) Active Problems:   Essential hypertension   Chronic combined systolic and diastolic congestive heart failure (HCC)   Hyperkalemia, diminished renal excretion   Volume overload    ESRD, missed dialysis - Pt with volume overload, hyperkalemia, uremia secondary to missed dialysis. Dialysis orders placed by nephrology, hopefully to get dialysis later this evening. Got Lokelma dose in the mean time Repeat BMP stat now to see where K is (was 6.1 but this at noon today). And again in AM Tele monitor Check trops (new TWI in inferior leads noted) Low back pain - No focal neurologic deficits May want to get MRI if persistent after dialysis Try robaxin for back pain PRN Vicodin ordered PRN if needed (pt not taking either of these prescribed meds since DC earlier this week). HTN - Cont home BP meds Add PRN if needed  DVT prophylaxis: Heparin [place] Code Status: Full Family Communication: No family in room Disposition Plan: Home after cleared by nephrology Consults called: Nephrology Admission status: Place in 20    Monserrat Vidaurri, [place] Hospital[facility]  How to contact the [facility] Attending or Consulting provider [place] or covering provider during after hours [place], for this patient?  Check the care team in [facility] and look for a) attending/consulting [facility] provider listed and b) the [facility] team listed Log into [email or web address]  Amion Physician Scheduling and messaging for groups and whole hospitals  On call and physician scheduling software for group practices, residents, hospitalists and other medical providers for call, clinic, rotation and shift schedules. On[employer] is a hospital-wide system for scheduling doctors and paging doctors on call.  EasyPlot is for scientific plotting and data analysis.  [email or web address]  and use [place]'s universal password to access. If you do not have the password, please contact the hospital operator.  Locate the [facility] provider you are looking for under [facility] and page to a number that you can be directly reached. If you still have difficulty reaching the provider, please page the [facility] (Director on Call) for the Hospitalists listed on amion for assistance.  07/17/2020, 10:45 PM

## 2020-07-17 NOTE — ED Triage Notes (Signed)
Patient BIB [facility] for lower back pain. T,TH,Sat dialysis, missed T dialysis due to back pain. 80% SpO2 on room air, reports cough for x1 week. Was tested here on Sunday for COVID and was negative, placed on 3L  SpO2 increased to 99% and he felt better. Patient alert, oriented, and in no apparent distress at this time.

## 2020-07-17 NOTE — ED Provider Notes (Signed)
Emergency Medicine Provider Triage Evaluation Note  Jason [name]. , a 60 y.o. male  was evaluated in triage.  Pt complains of weakness.  His last dialysis was on Sunday, he was too weak to make it on Tuesday or today.  He reports feeling increasingly short of breath and having back pain, but denies any falls or trauma to the back.  States she feels weak and like he is going to pass out but has not passed out yet.  He hasn't taken BP medicine in the last 4 days.   Review of Systems  Positive: Weakness, shortness of breath, presyncope, back pain Negative: Chest pain, leg swelling  Physical Exam  BP (!) 170/100   Pulse 66   Temp 98.5 F (36.9 C) (Oral)   Resp 18   SpO2 92%  Gen:   Awake, no distress   Resp:  Normal effort  MSK:   Moves extremities without difficulty  Other:    Medical Decision Making  Medically screening exam initiated at 12:01 PM.  Appropriate orders placed.  [name] was informed that the remainder of the evaluation will be completed by another provider, this initial triage assessment does not replace that evaluation, and the importance of remaining in the ED until their evaluation is complete.     Sherrill Raring, PA-C 07/17/20 1202    Daleen Bo, MD 07/19/20 [phone]

## 2020-07-17 NOTE — Progress Notes (Signed)
I see that Mr. Throgmorton is in the waiting room still with K 6.1, ^ BUN, and dyspnea after missing HD this week. I have not seen him yet, but I am going to put generic HD orders in his chart to help the over-night on-call nephrology provider. The HD census at [facility] is very high and there are already 4 other patients that need HD overnight - will plan to get to him as soon as possible but may be early AM. I am also putting order for PO Lokelma 10mg  to hold him for a bit.  Veneta Penton, PA-C [employer] Pager [phone]

## 2020-07-17 NOTE — ED Provider Notes (Signed)
[facility] EMERGENCY DEPARTMENT Provider Note   CSN: [id number] Arrival date & time: 07/17/20  1156     History Chief Complaint  Patient presents with   Back Pain    Jason [name]. is a 60 y.o. male with past medical history of hypertension, CHF, ESRD on hemodialysis Monday Wednesday Friday anemia of chronic disease that presents to the emerge department today for weakness and back pain. Patient states that he missed his last two days of dialysis, last  dialysis on Sunday, however states that prior to this he missed a couple sessions as well.  States that he has been so weak he cannot go to dialysis.  States this all started a week and a half ago when he started having severe lumbar pain.  Denies any heavy lifting or triggers to this pain.  Denies any nausea vomiting, saddle paresthesias, pain into his legs.  Stays in his lumbar region..  Patient states that he has been so weak over the past 4 days that he has not taken any of his medications including his hypertensive medications as he should.  Patient denies any falls to his back.  Denies any chest pain however does admit to shortness of breath.  Denies abdominal pain nausea vomiting diarrhea.  States that he has been more short of breath recently, over the past 2 days since he has not been to dialysis.  HPI     Past Medical History:  Diagnosis Date   A-V fistula ([place])    Right arm   Abdominal hernia    AKI (acute kidney injury) ([place]) 01/2015   Anasarca    Anemia    Asthma    as a child   Bilateral inguinal hernia    Bilateral pleural effusion 01/2017   Cellulitis 01/16/2017   Bilateral lower extremity   CHF (congestive heart failure) ([place])    Chronic venous insufficiency    Concentric left ventricular hypertrophy 08/17/2017   Moderated, noted on ECHO   Diastolic dysfunction 45/80/9983   noted on ECHO   Elevated LFTs    per patient, resolved   End stage renal disease ([place])    Dialysis T/Th/Sa  Started  02/2017/pt is waiting for a kidney transplant   H/O right inguinal hernia repair 11/2017   History of colonoscopy 03/20/2018   History of elevated PSA 10/2017   Hypertension    Pneumonia    Pulmonary hypertension ([place])    Moderate   Venous stasis ulcer ([place])    bil legs/ healed up now per pt (03/06/18)    Patient Active Problem List   Diagnosis Date Noted   Hyperkalemia, diminished renal excretion 07/17/2020   Volume overload 07/17/2020   Acute encephalopathy 10/30/2019   Mediastinal mass 05/21/2019   Hyperkalemia 04/26/2019   Chronic combined systolic and diastolic congestive heart failure ([place]) 04/26/2019   Hypertensive urgency 02/17/2019   Hypertensive emergency 02/17/2019   Leg edema, left 02/17/2019   Bilateral impacted cerumen 08/24/2018   History of elevated PSA 08/24/2018   Hemodialysis patient ([place]) 05/28/2018   Vitamin D deficiency 05/28/2018   Left inguinal hernia s/p lap repair w mesh 11/18/2017 11/18/2017   Scrotal hernia, right, s/p lap repair w mesh 11/18/2017 05/30/2017   Arteriovenous fistula (Right arm) for dialysis 38/25/0539   Umbilical hernia s/p primary repair 11/18/2017 05/30/2017   Anasarca 02/18/2017   ESRD (end stage renal disease) on dialysis [facility])    NSVT (nonsustained ventricular tachycardia) ([place])    Evaluation by psychiatric  service required 01/19/2017   Elevated troponin level not due myocardial infarction 01/16/2017   Acute CHF (congestive heart failure) ([place]) 01/16/2017   Venous stasis ulcer ([place]) 01/16/2017   Essential hypertension 02/26/2015   Abnormal EKG 01/30/2015   Anemia of chronic disease 01/29/2015    Past Surgical History:  Procedure Laterality Date   AV FISTULA PLACEMENT Right 01/20/2017   Procedure: ARTERIOVENOUS (AV) FISTULA CREATION;  Surgeon: Angelia Mould, MD;  Location: [facility] OR;  Service: Vascular;  Laterality: Right;   HERNIA REPAIR     INSERTION OF DIALYSIS CATHETER Right 01/20/2017   Procedure: INSERTION OF  DIALYSIS CATHETER;  Surgeon: Angelia Mould, MD;  Location: [place];  Service: Vascular;  Laterality: Right;   INSERTION OF MESH N/A 11/18/2017   Procedure: INSERTION OF MESH;  Surgeon: Michael Boston, MD;  Location: [facility] ORS;  Service: General;  Laterality: N/A;   IR FLUORO GUIDE CV LINE RIGHT  01/18/2017   IR US GUIDE VASC ACCESS RIGHT  01/18/2017   LAPAROSCOPIC INGUINAL HERNIA WITH UMBILICAL HERNIA Bilateral 11/18/2017   Procedure: LAPAROSCOPIC BILATERAL INGUINAL HERNIA REPAIR WITH UMBILICAL HERNIA REPAIR WITH MESH;  Surgeon: Michael Boston, MD;  Location: [facility] ORS;  Service: General;  Laterality: Bilateral;   TOE SURGERY     right fracture in big toe   TONSILLECTOMY         Family History  Problem Relation Age of Onset   Heart attack Mother 42       CABG hx   Heart disease Mother    Hypertension Mother    Stroke Mother    Diabetes Father    Healthy Brother    Prostate cancer Maternal Grandfather    Colon cancer Neg Hx    Colon polyps Neg Hx    Esophageal cancer Neg Hx    Stomach cancer Neg Hx    Rectal cancer Neg Hx     Social History   Tobacco Use   Smoking status: Former    Years: 17.00    Pack years: 0.00    Types: Cigarettes    Quit date: 1997    Years since quitting: 25.5   Smokeless tobacco: Never   Tobacco comments:    quit smoking in 1997  Vaping Use   Vaping Use: Never used  Substance Use Topics   Alcohol use: Not Currently    Comment: every several months, rare   Drug use: Not Currently    Comment: marijuana    Home Medications Prior to Admission medications   Medication Sig Start Date End Date Taking? Authorizing Provider  amLODipine (NORVASC) 10 MG tablet Take 10 mg by mouth every evening. 06/20/18   [provider]  carvedilol (COREG) 25 MG tablet Take 1 tablet (25 mg total) by mouth 2 (two) times daily with a meal. 02/02/18   [name], FNP  doxercalciferol (HECTOROL) 0.5 MCG capsule Doxercalciferol (Hectorol) 02/21/20 02/19/21   [provider]  hydrALAZINE (APRESOLINE) 100 MG tablet Take 1 tablet (100 mg total) by mouth 3 (three) times daily. 11/03/19 12/03/19  Darliss Cheney, MD  HYDROcodone-acetaminophen (NORCO/VICODIN) 5-325 MG tablet Take 1 tablet by mouth every 6 (six) hours as needed for severe pain. 07/13/20   Truddie Hidden, MD  iron sucrose in sodium chloride 0.9 % 100 mL Iron Sucrose (Venofer) 02/26/20 02/17/21  [provider]  isosorbide mononitrate (IMDUR) 30 MG 24 hr tablet Take by mouth. 12/18/19   [provider]  methocarbamol (ROBAXIN) 500 MG tablet Take 1  tablet (500 mg total) by mouth 2 (two) times daily. 07/13/20   Truddie Hidden, MD  Methoxy PEG-Epoetin Beta (MIRCERA IJ) Mircera 02/12/20 02/10/21  [provider]  multivitamin (RENA-VIT) TABS tablet Take 1 tablet by mouth daily. 10/20/17   [provider]  polyvinyl alcohol (LIQUIFILM TEARS) 1.4 % ophthalmic solution Place 1 drop into both eyes as needed for dry eyes.    [provider]  sevelamer carbonate (RENVELA) 800 MG tablet Take 2,400 mg by mouth 3 (three) times daily. 01/15/20   [provider]    Allergies    Lisinopril  Review of Systems   Review of Systems  Constitutional:  Negative for chills, diaphoresis, fatigue and fever.  HENT:  Negative for congestion, sore throat and trouble swallowing.   Eyes:  Negative for pain and visual disturbance.  Respiratory:  Positive for shortness of breath. Negative for cough and wheezing.   Cardiovascular:  Negative for chest pain, palpitations and leg swelling.  Gastrointestinal:  Negative for abdominal distention, abdominal pain, diarrhea, nausea and vomiting.  Genitourinary:  Negative for difficulty urinating.  Musculoskeletal:  Positive for back pain. Negative for neck pain and neck stiffness.  Skin:  Negative for pallor.  Neurological:  Positive for weakness. Negative for dizziness, speech difficulty and headaches.   Psychiatric/Behavioral:  Negative for confusion.    Physical Exam Updated Vital Signs BP (!) 180/100 (BP Location: Left Arm)   Pulse 66   Temp 98.5 F (36.9 C) (Oral)   Resp 16   SpO2 99%   Physical Exam Constitutional:      General: He is not in acute distress.    Appearance: Normal appearance. He is not ill-appearing, toxic-appearing or diaphoretic.     Comments: Patient is globally weak.  HENT:     Mouth/Throat:     Mouth: Mucous membranes are moist.     Pharynx: Oropharynx is clear.  Eyes:     General: No scleral icterus.    Extraocular Movements: Extraocular movements intact.     Pupils: Pupils are equal, round, and reactive to light.  Cardiovascular:     Rate and Rhythm: Normal rate and regular rhythm.     Pulses: Normal pulses.     Heart sounds: Normal heart sounds.  Pulmonary:     Effort: Pulmonary effort is normal. No respiratory distress.     Breath sounds: Normal breath sounds. No stridor. No wheezing, rhonchi or rales.  Chest:     Chest wall: No tenderness.  Abdominal:     General: Abdomen is flat. There is no distension.     Palpations: Abdomen is soft.     Tenderness: There is no abdominal tenderness. There is no guarding or rebound.  Musculoskeletal:        General: No swelling or tenderness. Normal range of motion.     Cervical back: Normal range of motion and neck supple. No rigidity.       Back:     Right lower leg: No edema.     Left lower leg: No edema.     Comments: Patient with tenderness to palpation all across lumbar spine, worse in L5-S1 region.  No erythema, no pinpoint tenderness.  No objective numbness.  Negative leg raise.  Skin:    General: Skin is warm and dry.     Capillary Refill: Capillary refill takes less than 2 seconds.     Coloration: Skin is not pale.  Neurological:     General: No focal deficit present.  Mental Status: He is alert and oriented to person, place, and time.  Psychiatric:        Mood and Affect: Mood  normal.        Behavior: Behavior normal.    ED Results / Procedures / Treatments   Labs (all labs ordered are listed, but only abnormal results are displayed) Labs Reviewed  CBC WITH DIFFERENTIAL/PLATELET - Abnormal; Notable for the following components:      Result Value   RBC 3.11 (*)    Hemoglobin 9.5 (*)    HCT 29.0 (*)    Lymphs Abs 0.4 (*)    All other components within normal limits  BRAIN NATRIURETIC PEPTIDE - Abnormal; Notable for the following components:   B Natriuretic Peptide 3,654.1 (*)    All other components within normal limits  COMPREHENSIVE METABOLIC PANEL - Abnormal; Notable for the following components:   Potassium 6.1 (*)    Chloride 97 (*)    Glucose, Bld 114 (*)    BUN 126 (*)    Creatinine, Ser 17.08 (*)    Calcium 8.4 (*)    Total Protein 6.4 (*)    Albumin 3.3 (*)    Alkaline Phosphatase 35 (*)    GFR, Estimated 3 (*)    Anion gap 16 (*)    All other components within normal limits  RESP PANEL BY RT-PCR (FLU A&B, COVID) ARPGX2  HIV ANTIBODY (ROUTINE TESTING W REFLEX)  CBC  BASIC METABOLIC PANEL  BASIC METABOLIC PANEL  TROPONIN I (HIGH SENSITIVITY)    EKG EKG Interpretation  Date/Time:  Thursday July 17 2020 11:56:59 EDT Ventricular Rate:  63 PR Interval:  144 QRS Duration: 112 QT Interval:  464 QTC Calculation: 474 R Axis:   -72 Text Interpretation: Normal sinus rhythm Left anterior fascicular block Minimal voltage criteria for LVH, may be normal variant ( Cornell product ) Nonspecific ST and T wave abnormality Abnormal ECG borderline T wave changes compared to previous Confirmed by Theotis Burrow [phone]) on 07/17/2020 9:32:22 PM  Radiology DG Chest 2 View  Result Date: 07/17/2020 CLINICAL DATA:  60 year old male with shortness of breath and cough for 1 week. EXAM: CHEST - 2 VIEW COMPARISON:  July 13, 2020. FINDINGS: Image mildly rotated to the LEFT. Accounting for this cardiomediastinal contours and hilar structures are stable. No lobar  consolidation or sign of pleural effusion. Signs of vascular crowding/pulmonary vascular congestion. No visible pneumothorax. No effusion. On limited assessment no acute skeletal process. IMPRESSION: Cardiomegaly with pulmonary vascular congestion as before. Electronically Signed   By: Zetta Bills M.D.   On: 07/17/2020 12:54   DG Lumbar Spine Complete  Result Date: 07/17/2020 CLINICAL DATA:  Back pain EXAM: LUMBAR SPINE - COMPLETE 4+ VIEW COMPARISON:  None. FINDINGS: Straightening of the lumbar spine. Vertebral body heights are maintained. Moderate disc space narrowing and degenerative change at L5-S1. Facet degenerative changes of the lower lumbar spine. IMPRESSION: Straightening of the lumbar spine with moderate to marked degenerative change at L5-S1 Electronically Signed   By: Donavan Foil M.D.   On: 07/17/2020 21:48    Procedures Procedures   Medications Ordered in ED Medications  Chlorhexidine Gluconate Cloth 2 % PADS 6 each (has no administration in time range)  pentafluoroprop-tetrafluoroeth (GEBAUERS) aerosol 1 application (has no administration in time range)  lidocaine (PF) (XYLOCAINE) 1 % injection 5 mL (has no administration in time range)  lidocaine-prilocaine (EMLA) cream 1 application (has no administration in time range)  0.9 %  sodium chloride infusion (  has no administration in time range)  0.9 %  sodium chloride infusion (has no administration in time range)  heparin injection 1,000 Units (has no administration in time range)  alteplase (CATHFLO ACTIVASE) injection 2 mg (has no administration in time range)  sodium zirconium cyclosilicate (LOKELMA) packet 10 g (10 g Oral Given 07/17/20 2203)  acetaminophen (TYLENOL) tablet 650 mg (has no administration in time range)    Or  acetaminophen (TYLENOL) suppository 650 mg (has no administration in time range)  ondansetron (ZOFRAN) tablet 4 mg (has no administration in time range)    Or  ondansetron (ZOFRAN) injection 4 mg (has  no administration in time range)  heparin injection 5,000 Units (has no administration in time range)  oxyCODONE-acetaminophen (PERCOCET/ROXICET) 5-325 MG per tablet 1 tablet (1 tablet Oral Given 07/17/20 2202)    ED Course  I have reviewed the triage vital signs and the nursing notes.  Pertinent labs & imaging results that were available during my care of the patient were reviewed by me and considered in my medical decision making (see chart for details).    MDM Rules/Calculators/A&P                          Patient presents to the emerge department today for weakness and back pain.  Patient is 2 days of dialysis.  Is globally weak on exam, has not been taking his medications for the past 4 days.  CMP today shows potassium of 6.1, did consult nephrology Dr. Justin Mend who wants patient admitted to medicine.  In regards to shortness of breath, patient states that he has been short of breath since he is missing dialysis, most likely because he is fluid overloaded.  BNP elevated today.  EKG interpreted by Dr. Rex Kras. No hyperacute T waves.  Chest x-ray interpreted by me without any signs of acute cardiopulmonary disease, does show some vascular congestion.  Regards to back pain, no red flag symptoms.  Plain films of back do show degenerative changes in L5-S1 area where patient is having tenderness.  Patient has been admitted to Dr. Alcario Drought.  Patient unfortunately in the hallway, did speak to charge nurse about moving patient to a room when there is 1 available.  Patient is on cardiac monitoring.  Patient appears reasonably stabilized for admission considering the current resources, flow, and capabilities available in the ED at this time, and I doubt any other [facility] requiring further screening and/or treatment in the ED prior to admission.  I discussed this case with my attending physician who cosigned this note including patient's presenting symptoms, physical exam, and planned diagnostics and  interventions. Attending physician stated agreement with plan or made changes to plan which were implemented.   Attending physician assessed patient at bedside. Final Clinical Impression(s) / ED Diagnoses Final diagnoses:  Acute bilateral low back pain without sciatica  Weakness  Hyperkalemia    Rx / DC Orders ED Discharge Orders     None        Alfredia Client, PA-C 07/17/20 2303    Little, Wenda Overland, MD 07/19/20 [phone]

## 2020-07-18 ENCOUNTER — Observation Stay (HOSPITAL_COMMUNITY): Payer: Medicare HMO

## 2020-07-18 DIAGNOSIS — Z992 Dependence on renal dialysis: Secondary | ICD-10-CM

## 2020-07-18 DIAGNOSIS — N186 End stage renal disease: Secondary | ICD-10-CM | POA: Diagnosis not present

## 2020-07-18 DIAGNOSIS — M545 Low back pain, unspecified: Secondary | ICD-10-CM | POA: Diagnosis not present

## 2020-07-18 DIAGNOSIS — M4640 Discitis, unspecified, site unspecified: Secondary | ICD-10-CM

## 2020-07-18 DIAGNOSIS — I5042 Chronic combined systolic (congestive) and diastolic (congestive) heart failure: Secondary | ICD-10-CM | POA: Diagnosis not present

## 2020-07-18 LAB — BASIC METABOLIC PANEL
Anion gap: 20 — ABNORMAL HIGH (ref 5–15)
Anion gap: 12 (ref 5–15)
BUN: 134 mg/dL — ABNORMAL HIGH (ref 6–20)
BUN: 52 mg/dL — ABNORMAL HIGH (ref 6–20)
CO2: 20 mmol/L — ABNORMAL LOW (ref 22–32)
CO2: 29 mmol/L (ref 22–32)
Calcium: 8.3 mg/dL — ABNORMAL LOW (ref 8.9–10.3)
Calcium: 8.3 mg/dL — ABNORMAL LOW (ref 8.9–10.3)
Chloride: 96 mmol/L — ABNORMAL LOW (ref 98–111)
Chloride: 94 mmol/L — ABNORMAL LOW (ref 98–111)
Creatinine, Ser: 17.14 mg/dL — ABNORMAL HIGH (ref 0.61–1.24)
Creatinine, Ser: 8.66 mg/dL — ABNORMAL HIGH (ref 0.61–1.24)
GFR, Estimated: 3 mL/min — ABNORMAL LOW (ref >60)
GFR, Estimated: 7 mL/min — ABNORMAL LOW (ref >60)
Glucose, Bld: 132 mg/dL — ABNORMAL HIGH (ref 70–99)
Glucose, Bld: 94 mg/dL (ref 70–99)
Potassium: 5.2 mmol/L — ABNORMAL HIGH (ref 3.5–5.1)
Potassium: 3.6 mmol/L (ref 3.5–5.1)
Sodium: 136 mmol/L (ref 135–145)
Sodium: 135 mmol/L (ref 135–145)

## 2020-07-18 LAB — HIV ANTIBODY (ROUTINE TESTING W REFLEX): HIV Screen 4th Generation wRfx: NONREACTIVE

## 2020-07-18 LAB — CBC
HCT: 28.6 % — ABNORMAL LOW (ref 39.0–52.0)
Hemoglobin: 9.6 g/dL — ABNORMAL LOW (ref 13.0–17.0)
MCH: 30.4 pg (ref 26.0–34.0)
MCHC: 33.6 g/dL (ref 30.0–36.0)
MCV: 90.5 fL (ref 80.0–100.0)
Platelets: 212 10*3/uL (ref 150–400)
RBC: 3.16 MIL/uL — ABNORMAL LOW (ref 4.22–5.81)
RDW: 12.8 % (ref 11.5–15.5)
WBC: 4.2 10*3/uL (ref 4.0–10.5)
nRBC: 0 % (ref 0.0–0.2)

## 2020-07-18 LAB — TROPONIN I (HIGH SENSITIVITY)
Troponin I (High Sensitivity): 135 ng/L (ref <18)
Troponin I (High Sensitivity): 126 ng/L (ref <18)

## 2020-07-18 IMAGING — MR MR LUMBAR SPINE W/O CM
4 of 5 series · 18 of 48 positions shown · non-contrast
Comparison: Lumbar spine radiographs [DATE].

CLINICAL DATA: Weakness and back pain. History end-stage renal
disease and anemia. Infection suspected.

EXAM:
MRI LUMBAR SPINE WITHOUT CONTRAST
TECHNIQUE: Multiplanar, multisequence MR imaging of the lumbar spine was
performed. No intravenous contrast was administered.

[Series 5: T2 · sagittal · 4.0mm · 0.55mm/px · 6 of 15 slices shown (1 of 2)]
[im 1/15]
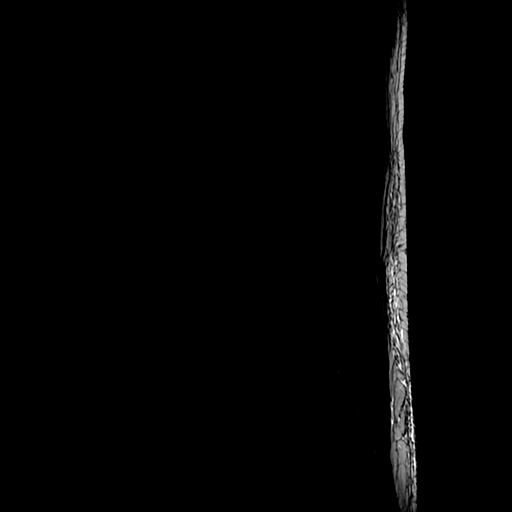
[im 3/15]
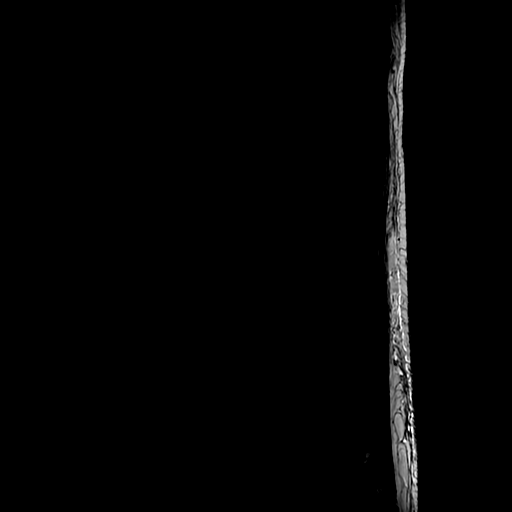
[im 6/15]
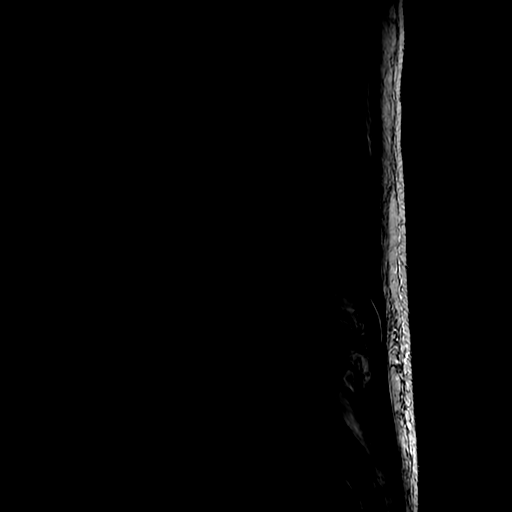
[im 9/15]
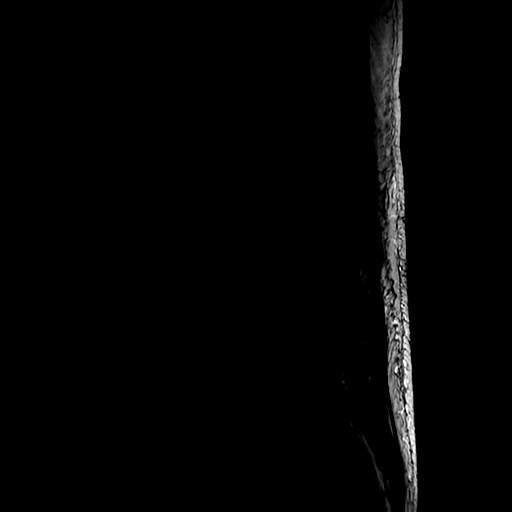
[im 12/15]
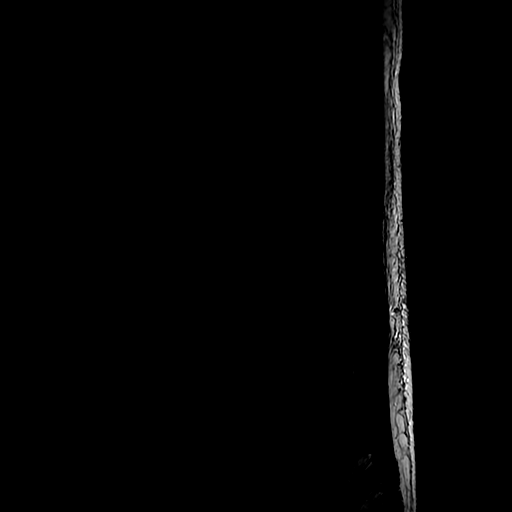
[im 15/15]
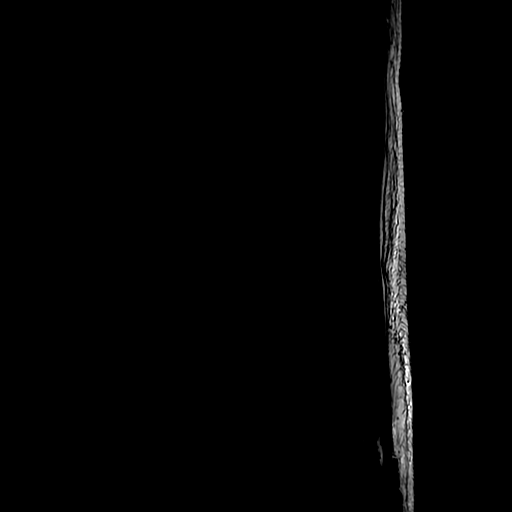

[Series 7: T1 · sagittal · 4.0mm · 0.55mm/px · 3 of 15 slices shown (1 of 2)]
[im 3/15]
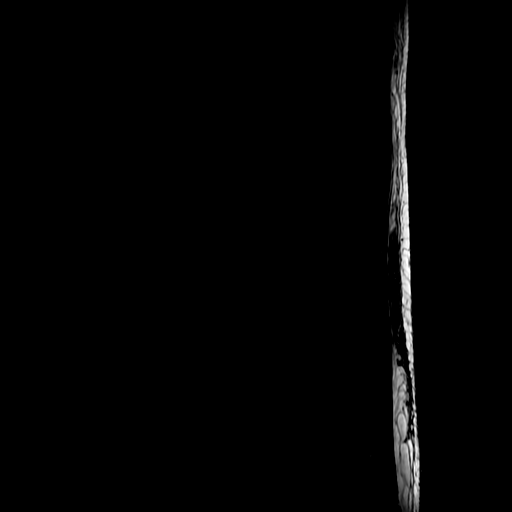
[im 9/15]
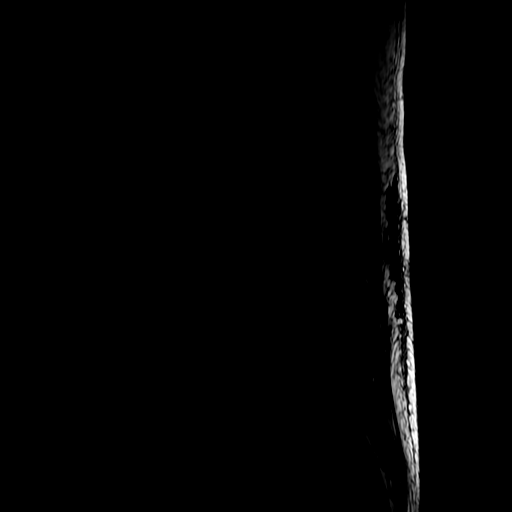
[im 15/15]
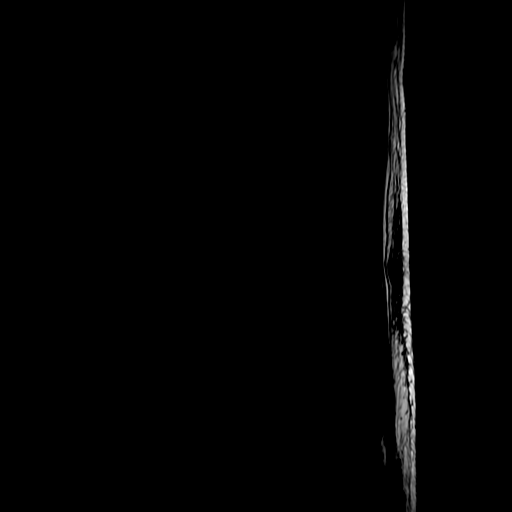

[Series 8: T2 · axial · 4.0mm · 0.39mm/px · z∈[-49,+129]mm · 6 of 36 slices shown (2 of 2)]
[im 1/36]
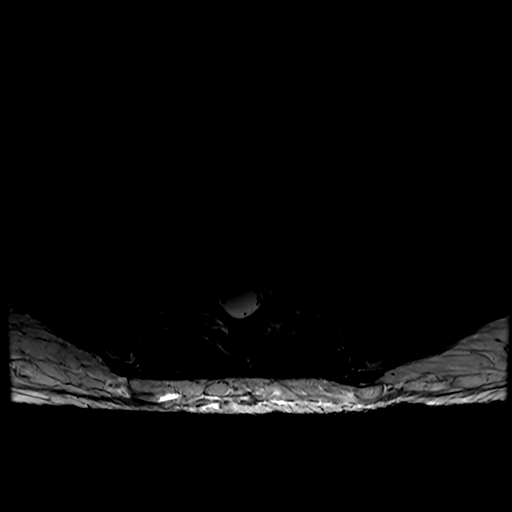
[im 6/36]
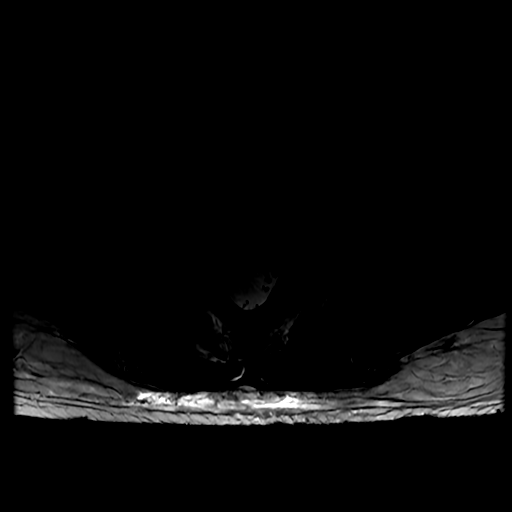
[im 11/36]
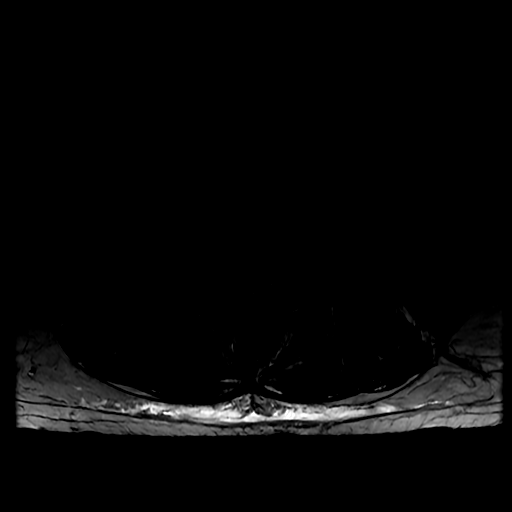
[im 16/36]
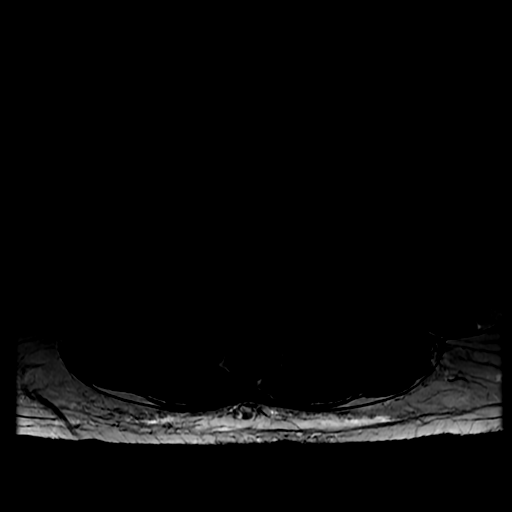
[im 18/36]
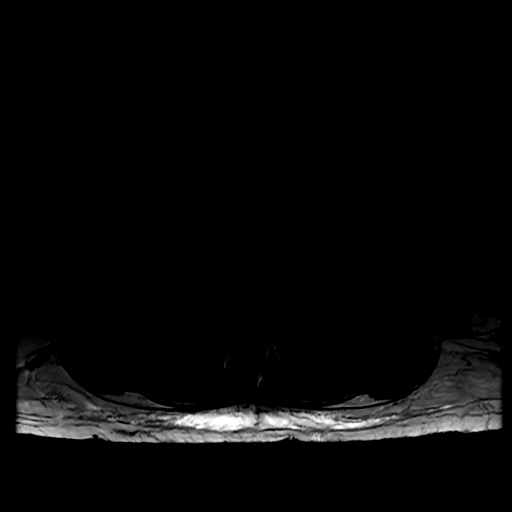
[im 31/36]
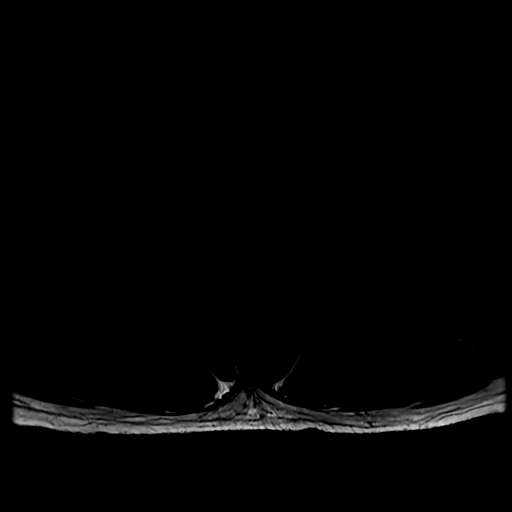

[Series 9: T1 · axial · 4.0mm · 0.39mm/px · z∈[-25,+129]mm · 3 of 36 slices shown (2 of 2)]
[im 6/36]
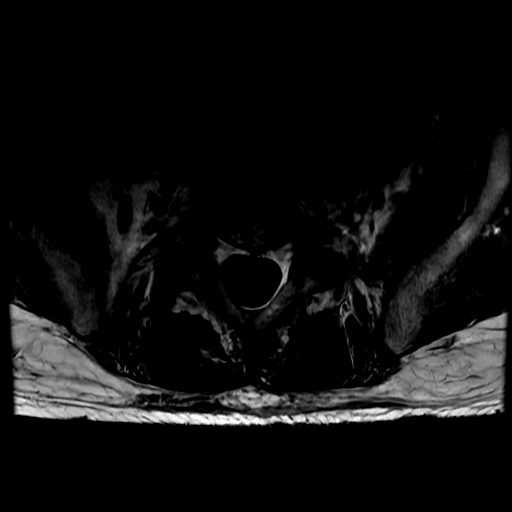
[im 18/36]
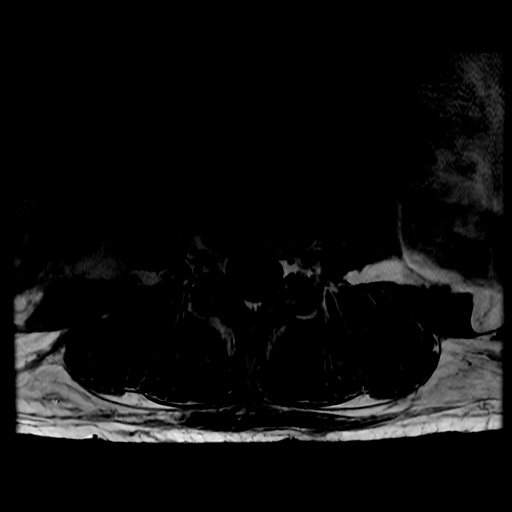
[im 31/36]
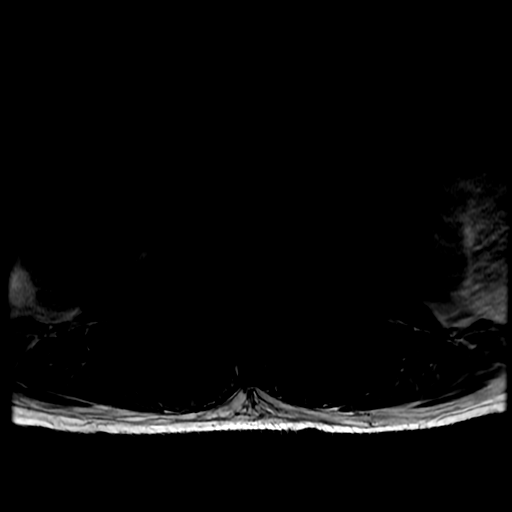

[18 of 48 positions shown; findings below may reference images not displayed]

FINDINGS: Segmentation: Conventional anatomy assumed, with the last open disc
space designated L5-S1.

Alignment:  Straightening without focal angulation or listhesis.

Vertebrae: As demonstrated radiographically, there is advanced disc
space narrowing at L5-S1 with endplate osteophytes. There is
prominent T2 hyperintensity within the L5-S1 disc, and there are
associated endplate erosions and edema. No other suspicious disc
space findings. The visualized sacroiliac joints appear
unremarkable.

Conus medullaris: Extends to the T12-L1 level and appears normal.

Paraspinal and other soft tissues: Mild paraspinal inflammatory
changes anteriorly at L5-S1 without focal fluid collection.

Disc levels:

L1-2: Disc height and hydration are maintained. Mild anterior
osteophyte formation. No spinal stenosis or nerve root encroachment.

L2-3: Mild disc bulging and anterior osteophyte formation. Mild
facet hypertrophy. No spinal stenosis or nerve root encroachment.

L3-4: The disc appears normal. Mild facet hypertrophy. No spinal
stenosis or nerve root encroachment.

L4-5: The disc appears normal. Mild bilateral facet hypertrophy. No
spinal stenosis or nerve root encroachment.

L5-S1: As above, chronic degenerative disc disease with loss of disc
height, annular disc bulging and endplate osteophytes. Moderate
facet and ligamentous hypertrophy. Moderate foraminal narrowing is
present bilaterally. There is no epidural fluid collection.
IMPRESSION: 1. Nonspecific, but suspicious, disc and endplate changes at L5-S1
as described. Findings could relate to dialysis-related
spondyloarthropathy, although the paraspinal inflammatory changes
are moderately suspicious for diskitis and early osteomyelitis.
Consider disc space aspiration and/or short-term MR follow-up
without and with contrast.
2. No focal fluid collection or epidural inflammation identified.
3. Moderate foraminal narrowing bilaterally at L5-S1.
4. No other significant disc space findings.

## 2020-07-18 MED ORDER — HYDRALAZINE HCL 20 MG/ML IJ SOLN: 10.0000 mg | INTRAMUSCULAR | Status: DC | PRN

## 2020-07-18 NOTE — Progress Notes (Addendum)
PROGRESS NOTE  Jason [name]. KGY:[id number] DOB: [birth year] DOA: 07/17/2020 PCP: [name], FNP (Inactive)   LOS: 0 days   Brief Narrative / Interim history: 60 year old male with history of ESRD on TTS dialysis, HTN, moderate PAH, chronic diastolic CHF comes into the hospital with generalized weakness, low back pain as well as shortness of breath.  Patient states that he experienced severe back pain about a week ago, somewhat out of the blue as he does not recall any trauma or doing heavy work.  Pain was so severe that he was unable to get out of bed, move around, and unable to make it to his dialysis sessions  Subjective / 24h Interval events: Doing better this morning, received pain medication and his back pain is controlled.  Breathing better after dialysis last night  Assessment & Plan: Principal Problem Acute hypoxic respiratory failure in the setting of acute pulmonary edema due to acute on chronic diastolic CHF in the setting of missed dialysis-nephrology consulted, he was taken to dialysis emergently overnight last night and he is much improved this morning.  He is on room air.  Active Problems ESRD, hyperkalemia-dialysis per nephrology Suspect discitis/osteomyelitis-MRI done this morning without contrast showed an area at the L5/S1 suspect for discitis/osteomyelitis.  Patient without any fever or chills in the last several weeks, not an IV drug user. -Consulted IR, they will do an aspiration hopefully later today or tomorrow -Consulted ID, I sent blood cultures and continue to closely monitor off antibiotics Essential hypertension-quite hypertensive today, he is on amlodipine, Coreg, hydralazine.  Add on hydralazine as needed.  Suspect pain contributing Anemia in the setting of end-stage renal disease-hemoglobin stable, monitor   Scheduled Meds:  amLODipine  10 mg Oral QPM   carvedilol  25 mg Oral BID WC   Chlorhexidine Gluconate Cloth  6 each Topical Q0600   heparin   5,000 Units Subcutaneous Q8H   hydrALAZINE  100 mg Oral TID   multivitamin  1 tablet Oral QHS   sevelamer carbonate  2,400-4,000 mg Oral TID WC   Continuous Infusions: PRN Meds:.acetaminophen **OR** acetaminophen, HYDROcodone-acetaminophen, methocarbamol, ondansetron **OR** ondansetron (ZOFRAN) IV, polyvinyl alcohol  Diet Orders (From admission, onward)     Start     Ordered   07/17/20 2236  Diet renal with fluid restriction Fluid restriction: 1200 mL Fluid; Room service appropriate? Yes; Fluid consistency: Thin  Diet effective now       Question Answer Comment  Fluid restriction: 1200 mL Fluid   Room service appropriate? Yes   Fluid consistency: Thin      07/17/20 2235            DVT prophylaxis: heparin injection 5,000 Units Start: 07/17/20 2245     Code Status: Full Code  Family Communication: No family at bedside  Status is: Observation  The patient will require care spanning > 2 midnights and should be moved to inpatient because: Ongoing diagnostic testing needed not appropriate for outpatient work up and Inpatient level of care appropriate due to severity of illness  Dispo: The patient is from: Home              Anticipated d/c is to: Home              Patient currently is not medically stable to d/c.   Difficult to place patient No   Level of care: Telemetry Medical  Consultants:  ID IR Nephrology   Procedures:  None   Microbiology  Blood cultures  7/8   Antimicrobials: none    Objective: Vitals:   07/18/20 0531 07/18/20 0700 07/18/20 1009 07/18/20 1300  BP: (!) (P) 189/115 (!) 190/104 (!) 185/109 (!) 181/102  Pulse: (P) 64 70 75   Resp:  18 19 18   Temp: (!) (P) 97.5 F (36.4 C) 98.1 F (36.7 C) 98 F (36.7 C) 98.5 F (36.9 C)  TempSrc: (P) Oral Oral Oral Oral  SpO2: (P) 100% 90% 95% 96%    Intake/Output Summary (Last 24 hours) at 07/18/2020 1422 Last data filed at 07/18/2020 0531 Gross per 24 hour  Intake --  Output 3536 ml  Net -3536  ml   Filed Weights    Examination:  Constitutional: NAD Eyes: no scleral icterus ENMT: Mucous membranes are moist.  Neck: normal, supple Respiratory: clear to auscultation bilaterally, no wheezing, no crackles. Normal respiratory effort.  Cardiovascular: Regular rate and rhythm, no murmurs / rubs / gallops. No LE edema.  Abdomen: non distended, no tenderness. Bowel sounds positive.  Musculoskeletal: no clubbing / cyanosis.  Skin: no rashes Neurologic: CN 2-12 grossly intact. Strength 5/5 in all 4.   Data Reviewed: I have independently reviewed following labs and imaging studies   CBC: Recent Labs  Lab 07/13/20 0935 07/13/20 1153 07/17/20 1201 07/18/20 0628  WBC 6.8 6.6 4.7 4.2  NEUTROABS 5.6  --  3.6  --   HGB 10.1* 10.3* 9.5* 9.6*  HCT 31.5* 31.6* 29.0* 28.6*  MCV 95.2 94.9 93.2 90.5  PLT 220 200 201 443   Basic Metabolic Panel: Recent Labs  Lab 07/13/20 0935 07/13/20 1153 07/17/20 1201 07/17/20 2329 07/18/20 0628  NA 138 136 136 136 135  K 6.4* 6.3* 6.1* 5.2* 3.6  CL 99 99 97* 96* 94*  CO2 24 23 23  20* 29  GLUCOSE 114* 108* 114* 132* 94  BUN 97* 97* 126* 134* 52*  CREATININE 13.96* 13.69* 17.08* 17.14* 8.66*  CALCIUM 9.1 9.1 8.4* 8.3* 8.3*  PHOS  --  11.3*  --   --   --    Liver Function Tests: Recent Labs  Lab 07/13/20 0935 07/13/20 1153 07/17/20 1201  AST 19  --  21  ALT 21  --  23  ALKPHOS 34*  --  35*  BILITOT 0.7  --  0.8  PROT 6.3*  --  6.4*  ALBUMIN 3.5 3.5 3.3*   Coagulation Profile: No results for input(s): INR, PROTIME in the last 168 hours. HbA1C: No results for input(s): HGBA1C in the last 72 hours. CBG: No results for input(s): GLUCAP in the last 168 hours.  Recent Results (from the past 240 hour(s))  Resp Panel by RT-PCR (Flu A&B, Covid) Nasopharyngeal Swab     Status: None   Collection Time: 07/13/20 12:30 PM   Specimen: Nasopharyngeal Swab; Nasopharyngeal(NP) swabs in vial transport medium  Result Value Ref Range Status    SARS Coronavirus 2 by RT PCR NEGATIVE NEGATIVE Final    Comment: (NOTE) SARS-CoV-2 target nucleic acids are NOT DETECTED.  The SARS-CoV-2 RNA is generally detectable in upper respiratory specimens during the acute phase of infection. The lowest concentration of SARS-CoV-2 viral copies this assay can detect is 138 copies/mL. A negative result does not preclude SARS-Cov-2 infection and should not be used as the sole basis for treatment or other patient management decisions. A negative result may occur with  improper specimen collection/handling, submission of specimen other than nasopharyngeal swab, presence of viral mutation(s) within the areas targeted by this assay, and inadequate number of  viral copies(<138 copies/mL). A negative result must be combined with clinical observations, patient history, and epidemiological information. The expected result is Negative.  Fact Sheet for Patients:  EntrepreneurPulse.com.au  Fact Sheet for Healthcare Providers:  IncredibleEmployment.be  This test is no t yet approved or cleared by the [place] FDA and  has been authorized for detection and/or diagnosis of SARS-CoV-2 by FDA under an Emergency Use Authorization (EUA). This EUA will remain  in effect (meaning this test can be used) for the duration of the COVID-19 declaration under Section 564(b)(1) of the Act, 21 U.S.C.section 360bbb-3(b)(1), unless the authorization is terminated  or revoked sooner.       Influenza A by PCR NEGATIVE NEGATIVE Final   Influenza B by PCR NEGATIVE NEGATIVE Final    Comment: (NOTE) The Xpert Xpress SARS-CoV-2/FLU/RSV plus assay is intended as an aid in the diagnosis of influenza from Nasopharyngeal swab specimens and should not be used as a sole basis for treatment. Nasal washings and aspirates are unacceptable for Xpert Xpress SARS-CoV-2/FLU/RSV testing.  Fact Sheet for  Patients: EntrepreneurPulse.com.au  Fact Sheet for Healthcare Providers: IncredibleEmployment.be  This test is not yet approved or cleared by the [place] FDA and has been authorized for detection and/or diagnosis of SARS-CoV-2 by FDA under an Emergency Use Authorization (EUA). This EUA will remain in effect (meaning this test can be used) for the duration of the COVID-19 declaration under Section 564(b)(1) of the Act, 21 U.S.C. section 360bbb-3(b)(1), unless the authorization is terminated or revoked.  Performed at [facility], [place] [address], [place], [place] [address]   Resp Panel by RT-PCR (Flu A&B, Covid) Nasopharyngeal Swab     Status: None   Collection Time: 07/17/20  5:29 PM   Specimen: Nasopharyngeal Swab; Nasopharyngeal(NP) swabs in vial transport medium  Result Value Ref Range Status   SARS Coronavirus 2 by RT PCR NEGATIVE NEGATIVE Final    Comment: (NOTE) SARS-CoV-2 target nucleic acids are NOT DETECTED.  The SARS-CoV-2 RNA is generally detectable in upper respiratory specimens during the acute phase of infection. The lowest concentration of SARS-CoV-2 viral copies this assay can detect is 138 copies/mL. A negative result does not preclude SARS-Cov-2 infection and should not be used as the sole basis for treatment or other patient management decisions. A negative result may occur with  improper specimen collection/handling, submission of specimen other than nasopharyngeal swab, presence of viral mutation(s) within the areas targeted by this assay, and inadequate number of viral copies(<138 copies/mL). A negative result must be combined with clinical observations, patient history, and epidemiological information. The expected result is Negative.  Fact Sheet for Patients:  EntrepreneurPulse.com.au  Fact Sheet for Healthcare Providers:  IncredibleEmployment.be  This test is no t yet  approved or cleared by the [place] FDA and  has been authorized for detection and/or diagnosis of SARS-CoV-2 by FDA under an Emergency Use Authorization (EUA). This EUA will remain  in effect (meaning this test can be used) for the duration of the COVID-19 declaration under Section 564(b)(1) of the Act, 21 U.S.C.section 360bbb-3(b)(1), unless the authorization is terminated  or revoked sooner.       Influenza A by PCR NEGATIVE NEGATIVE Final   Influenza B by PCR NEGATIVE NEGATIVE Final    Comment: (NOTE) The Xpert Xpress SARS-CoV-2/FLU/RSV plus assay is intended as an aid in the diagnosis of influenza from Nasopharyngeal swab specimens and should not be used as a sole basis for treatment. Nasal washings and aspirates are unacceptable  for Xpert Xpress SARS-CoV-2/FLU/RSV testing.  Fact Sheet for Patients: EntrepreneurPulse.com.au  Fact Sheet for Healthcare Providers: IncredibleEmployment.be  This test is not yet approved or cleared by the [place] FDA and has been authorized for detection and/or diagnosis of SARS-CoV-2 by FDA under an Emergency Use Authorization (EUA). This EUA will remain in effect (meaning this test can be used) for the duration of the COVID-19 declaration under Section 564(b)(1) of the Act, 21 U.S.C. section 360bbb-3(b)(1), unless the authorization is terminated or revoked.  Performed at [facility], [place] [address], [place], [place] [address]      Radiology Studies: DG Lumbar Spine Complete  Result Date: 07/17/2020 CLINICAL DATA:  Back pain EXAM: LUMBAR SPINE - COMPLETE 4+ VIEW COMPARISON:  None. FINDINGS: Straightening of the lumbar spine. Vertebral body heights are maintained. Moderate disc space narrowing and degenerative change at L5-S1. Facet degenerative changes of the lower lumbar spine. IMPRESSION: Straightening of the lumbar spine with moderate to marked degenerative change at L5-S1 Electronically  Signed   By: Donavan Foil M.D.   On: 07/17/2020 21:48   MR LUMBAR SPINE WO CONTRAST  Result Date: 07/18/2020 CLINICAL DATA:  Weakness and back pain. History end-stage renal disease and anemia. Infection suspected. EXAM: MRI LUMBAR SPINE WITHOUT CONTRAST TECHNIQUE: Multiplanar, multisequence MR imaging of the lumbar spine was performed. No intravenous contrast was administered. COMPARISON:  Lumbar spine radiographs 07/17/2020. FINDINGS: Segmentation: Conventional anatomy assumed, with the last open disc space designated L5-S1. Alignment:  Straightening without focal angulation or listhesis. Vertebrae: As demonstrated radiographically, there is advanced disc space narrowing at L5-S1 with endplate osteophytes. There is prominent T2 hyperintensity within the L5-S1 disc, and there are associated endplate erosions and edema. No other suspicious disc space findings. The visualized sacroiliac joints appear unremarkable. Conus medullaris: Extends to the T12-L1 level and appears normal. Paraspinal and other soft tissues: Mild paraspinal inflammatory changes anteriorly at L5-S1 without focal fluid collection. Disc levels: L1-2: Disc height and hydration are maintained. Mild anterior osteophyte formation. No spinal stenosis or nerve root encroachment. L2-3: Mild disc bulging and anterior osteophyte formation. Mild facet hypertrophy. No spinal stenosis or nerve root encroachment. L3-4: The disc appears normal. Mild facet hypertrophy. No spinal stenosis or nerve root encroachment. L4-5: The disc appears normal. Mild bilateral facet hypertrophy. No spinal stenosis or nerve root encroachment. L5-S1: As above, chronic degenerative disc disease with loss of disc height, annular disc bulging and endplate osteophytes. Moderate facet and ligamentous hypertrophy. Moderate foraminal narrowing is present bilaterally. There is no epidural fluid collection. IMPRESSION: 1. Nonspecific, but suspicious, disc and endplate changes at E9-F8 as  described. Findings could relate to dialysis-related spondyloarthropathy, although the paraspinal inflammatory changes are moderately suspicious for diskitis and early osteomyelitis. Consider disc space aspiration and/or short-term MR follow-up without and with contrast. 2. No focal fluid collection or epidural inflammation identified. 3. Moderate foraminal narrowing bilaterally at L5-S1. 4. No other significant disc space findings. Electronically Signed   By: Richardean Sale M.D.   On: 07/18/2020 10:05    Marzetta Board, MD, PhD [facility]  Between 7 am - 7 pm I am available, please contact me via Amion (for emergencies) or Securechat (non urgent messages)  Between 7 pm - 7 am I am not available, please contact night coverage MD/APP via Amion

## 2020-07-18 NOTE — Consult Note (Signed)
[facility]    Date of Admission:  07/17/2020     Total days of antibiotics 0               Reason for Consult: Discitis/Osteomyelitis  Referring Provider: Dr. Cruzita Lederer Primary Care Provider: [name], FNP (Inactive)   ASSESSMENT:  Mr. Ksiazek is a 60 y/o gentleman with ESRD on HD admitted with severe, acute onset back pain with no history of trauma, injury or precipitating factor. MRI concerning for early discitis/osteomyelitis of L5-S1. Mr. Hubbert is currently declining aspiration by IR. Clinically his presentation and pain do not fit with the amount of pain that would be expected with discitis/osteomyelitis although it cannot be ruled out. Differential would include dialysis related spondyloarthropathy (albeit rare) vs other muscle skeletal origin. Pain medication may be masking pain. Blood cultures drawn and pending. Will order inflammatory markers. Continue to hold antibiotics to optimize cultures if/when able to get aspiration.   PLAN:  Continue to hold antibiotics.  Await blood cultures and inflammatory markers. Disc aspiration by IR if he is willing.  Continue pain management per primary team.    Principal Problem:   ESRD (end stage renal disease) on dialysis [facility]) Active Problems:   Essential hypertension   Chronic combined systolic and diastolic congestive heart failure (HCC)   Hyperkalemia, diminished renal excretion   Volume overload    amLODipine  10 mg Oral QPM   carvedilol  25 mg Oral BID WC   Chlorhexidine Gluconate Cloth  6 each Topical Q0600   heparin  5,000 Units Subcutaneous Q8H   hydrALAZINE  100 mg Oral TID   multivitamin  1 tablet Oral QHS   sevelamer carbonate  2,400-4,000 mg Oral TID WC     HPI: Chapman Matteucci. is a 60 y.o. male with previous medical history of ESRD on hemodialysis (T, Th, S), hypertension, pulmonary hypertension, and chronic venous insufficiency admitted with about 1.5 week history of weakness  and back pain.  Mr. Chesnut began having severe lumbar back pain about 1.5 weeks prior to presentation with no trauma, injury, heavy lifting or other triggers. Denied saddle anesthesia,changes to bowel/bladder habits, or radiculopathy. Severity of pain was enough that he was unable to complete his normal routines.Initial chest x-ray with cardiomegaly and pulmonary vascular congestion which was unchanged from previous images. Lumbar x-ray with straightening of the lumbar spine with moderate marked degenerative changes to L5-S1. MRI lumbar spine today, 7/8, with nonspecific but suspicious disc and endplate changes at E4-M3 which could related to dialysis related spondyloarthropathy although paraspinal inflammatory changes suspicious for diskitis and early osteomyelitis. IR has been consulted for aspiration.   Mr. Franca has been afebrile since admission with no leukocytosis. He has not received antibiotics to date. He had been experiencing shortness of breath which was attributed to him missing 2 dialysis sessions this week. Back pain is improved since being admitted. Denies any fevers or chills. Pain is improved when lying down and worsened with coughing.    Review of Systems: Review of Systems  Constitutional:  Negative for chills, fever and weight loss.  Respiratory:  Negative for cough, shortness of breath and wheezing.   Cardiovascular:  Negative for chest pain and leg swelling.  Gastrointestinal:  Negative for abdominal pain, constipation, diarrhea, nausea and vomiting.  Musculoskeletal:  Positive for back pain.  Skin:  Negative for rash.    Past Medical History:  Diagnosis Date   A-V fistula ([place])  Right arm   Abdominal hernia    AKI (acute kidney injury) ([place]) 01/2015   Anasarca    Anemia    Asthma    as a child   Bilateral inguinal hernia    Bilateral pleural effusion 01/2017   Cellulitis 01/16/2017   Bilateral lower extremity   CHF (congestive heart failure) ([place])    Chronic  venous insufficiency    Concentric left ventricular hypertrophy 08/17/2017   Moderated, noted on ECHO   Diastolic dysfunction 85/63/1497   noted on ECHO   Elevated LFTs    per patient, resolved   End stage renal disease [facility])    Dialysis T/Th/Sa  Started 02/2017/pt is waiting for a kidney transplant   H/O right inguinal hernia repair 11/2017   History of colonoscopy 03/20/2018   History of elevated PSA 10/2017   Hypertension    Pneumonia    Pulmonary hypertension (HCC)    Moderate   Venous stasis ulcer ([place])    bil legs/ healed up now per pt (03/06/18)    Social History   Tobacco Use   Smoking status: Former    Years: 17.00    Pack years: 0.00    Types: Cigarettes    Quit date: 1997    Years since quitting: 25.5   Smokeless tobacco: Never   Tobacco comments:    quit smoking in 1997  Vaping Use   Vaping Use: Never used  Substance Use Topics   Alcohol use: Not Currently    Comment: every several months, rare   Drug use: Not Currently    Comment: marijuana    Family History  Problem Relation Age of Onset   Heart attack Mother 10       CABG hx   Heart disease Mother    Hypertension Mother    Stroke Mother    Diabetes Father    Healthy Brother    Prostate cancer Maternal Grandfather    Colon cancer Neg Hx    Colon polyps Neg Hx    Esophageal cancer Neg Hx    Stomach cancer Neg Hx    Rectal cancer Neg Hx     Allergies  Allergen Reactions   Lisinopril Cough    OBJECTIVE: Blood pressure (!) 181/102, pulse 75, temperature 98.5 F (36.9 C), temperature source Oral, resp. rate 18, SpO2 96 %.  Physical Exam Constitutional:      General: He is not in acute distress.    Appearance: He is well-developed.     Comments: Lying in bed in left lateral decubitus position; pleasant.   Cardiovascular:     Rate and Rhythm: Normal rate and regular rhythm.     Heart sounds: Normal heart sounds.  Pulmonary:     Effort: Pulmonary effort is normal.     Breath sounds:  Normal breath sounds.  Musculoskeletal:     Comments: Low back with no obvious deformity, discoloration or edema.  Skin:    General: Skin is warm and dry.  Neurological:     Mental Status: He is alert and oriented to person, place, and time.  Psychiatric:        Mood and Affect: Mood normal.    Lab Results Lab Results  Component Value Date   WBC 4.2 07/18/2020   HGB 9.6 (L) 07/18/2020   HCT 28.6 (L) 07/18/2020   MCV 90.5 07/18/2020   PLT 212 07/18/2020    Lab Results  Component Value Date   CREATININE 8.66 (H) 07/18/2020   BUN 52 (  H) 07/18/2020   NA 135 07/18/2020   K 3.6 07/18/2020   CL 94 (L) 07/18/2020   CO2 29 07/18/2020    Lab Results  Component Value Date   ALT 23 07/17/2020   AST 21 07/17/2020   ALKPHOS 35 (L) 07/17/2020   BILITOT 0.8 07/17/2020     Microbiology: Recent Results (from the past 240 hour(s))  Resp Panel by RT-PCR (Flu A&B, Covid) Nasopharyngeal Swab     Status: None   Collection Time: 07/13/20 12:30 PM   Specimen: Nasopharyngeal Swab; Nasopharyngeal(NP) swabs in vial transport medium  Result Value Ref Range Status   SARS Coronavirus 2 by RT PCR NEGATIVE NEGATIVE Final    Comment: (NOTE) SARS-CoV-2 target nucleic acids are NOT DETECTED.  The SARS-CoV-2 RNA is generally detectable in upper respiratory specimens during the acute phase of infection. The lowest concentration of SARS-CoV-2 viral copies this assay can detect is 138 copies/mL. A negative result does not preclude SARS-Cov-2 infection and should not be used as the sole basis for treatment or other patient management decisions. A negative result may occur with  improper specimen collection/handling, submission of specimen other than nasopharyngeal swab, presence of viral mutation(s) within the areas targeted by this assay, and inadequate number of viral copies(<138 copies/mL). A negative result must be combined with clinical observations, patient history, and  epidemiological information. The expected result is Negative.  Fact Sheet for Patients:  EntrepreneurPulse.com.au  Fact Sheet for Healthcare Providers:  IncredibleEmployment.be  This test is no t yet approved or cleared by the [place] FDA and  has been authorized for detection and/or diagnosis of SARS-CoV-2 by FDA under an Emergency Use Authorization (EUA). This EUA will remain  in effect (meaning this test can be used) for the duration of the COVID-19 declaration under Section 564(b)(1) of the Act, 21 U.S.C.section 360bbb-3(b)(1), unless the authorization is terminated  or revoked sooner.       Influenza A by PCR NEGATIVE NEGATIVE Final   Influenza B by PCR NEGATIVE NEGATIVE Final    Comment: (NOTE) The Xpert Xpress SARS-CoV-2/FLU/RSV plus assay is intended as an aid in the diagnosis of influenza from Nasopharyngeal swab specimens and should not be used as a sole basis for treatment. Nasal washings and aspirates are unacceptable for Xpert Xpress SARS-CoV-2/FLU/RSV testing.  Fact Sheet for Patients: EntrepreneurPulse.com.au  Fact Sheet for Healthcare Providers: IncredibleEmployment.be  This test is not yet approved or cleared by the [place] FDA and has been authorized for detection and/or diagnosis of SARS-CoV-2 by FDA under an Emergency Use Authorization (EUA). This EUA will remain in effect (meaning this test can be used) for the duration of the COVID-19 declaration under Section 564(b)(1) of the Act, 21 U.S.C. section 360bbb-3(b)(1), unless the authorization is terminated or revoked.  Performed at [facility], [place] [address], [place], [place] [address]   Resp Panel by RT-PCR (Flu A&B, Covid) Nasopharyngeal Swab     Status: None   Collection Time: 07/17/20  5:29 PM   Specimen: Nasopharyngeal Swab; Nasopharyngeal(NP) swabs in vial transport medium  Result Value Ref Range Status    SARS Coronavirus 2 by RT PCR NEGATIVE NEGATIVE Final    Comment: (NOTE) SARS-CoV-2 target nucleic acids are NOT DETECTED.  The SARS-CoV-2 RNA is generally detectable in upper respiratory specimens during the acute phase of infection. The lowest concentration of SARS-CoV-2 viral copies this assay can detect is 138 copies/mL. A negative result does not preclude SARS-Cov-2 infection and should not be used as  the sole basis for treatment or other patient management decisions. A negative result may occur with  improper specimen collection/handling, submission of specimen other than nasopharyngeal swab, presence of viral mutation(s) within the areas targeted by this assay, and inadequate number of viral copies(<138 copies/mL). A negative result must be combined with clinical observations, patient history, and epidemiological information. The expected result is Negative.  Fact Sheet for Patients:  EntrepreneurPulse.com.au  Fact Sheet for Healthcare Providers:  IncredibleEmployment.be  This test is no t yet approved or cleared by the [place] FDA and  has been authorized for detection and/or diagnosis of SARS-CoV-2 by FDA under an Emergency Use Authorization (EUA). This EUA will remain  in effect (meaning this test can be used) for the duration of the COVID-19 declaration under Section 564(b)(1) of the Act, 21 U.S.C.section 360bbb-3(b)(1), unless the authorization is terminated  or revoked sooner.       Influenza A by PCR NEGATIVE NEGATIVE Final   Influenza B by PCR NEGATIVE NEGATIVE Final    Comment: (NOTE) The Xpert Xpress SARS-CoV-2/FLU/RSV plus assay is intended as an aid in the diagnosis of influenza from Nasopharyngeal swab specimens and should not be used as a sole basis for treatment. Nasal washings and aspirates are unacceptable for Xpert Xpress SARS-CoV-2/FLU/RSV testing.  Fact Sheet for  Patients: EntrepreneurPulse.com.au  Fact Sheet for Healthcare Providers: IncredibleEmployment.be  This test is not yet approved or cleared by the [place] FDA and has been authorized for detection and/or diagnosis of SARS-CoV-2 by FDA under an Emergency Use Authorization (EUA). This EUA will remain in effect (meaning this test can be used) for the duration of the COVID-19 declaration under Section 564(b)(1) of the Act, 21 U.S.C. section 360bbb-3(b)(1), unless the authorization is terminated or revoked.  Performed at [facility], [place] [address], [place], [place] [address]      Terri Piedra, [facility]  07/18/2020  2:36 PM

## 2020-07-18 NOTE — Progress Notes (Signed)
Patient doesn't have scheduled IV medications. Talked pt's nurse regarding this matter and not put in PIV access at this time. Pt's nurse will put in the consult if need a PIV access. HS [employer]

## 2020-07-18 NOTE — Consult Note (Addendum)
[facility] Renal Consultation Note    Indication for Consultation:  Management of ESRD/hemodialysis, anemia, hypertension/volume, and secondary hyperparathyroidism. PCP:  HPI: Jason [name]. is a 60 y.o. male with ESRD, HTN, PVD who was admitted OBS status with back pain and hyperkalemia.   Presented to ED with weakness, dyspnea, and back pain that has been ongoing x 1 week. Was in ED on 7/3 for similar, dialyzed and discharged home. Reports that he has essentially been unable to get out of bed and missed HD this week due to the pain. Denies fever, chills, abdominal pain, headache. In ED - HTN noted, was afebrile. Labs showed K 6.1, BUN 126, Cr 16, WBC 4.7, Hgb 9.5, ^ BNP. Flu/COVID negative. He was given Lokelma x 1, then dialyzed overnight.  Seen in room this morning - says feels a lot better. K down to 5.2, dyspnea resolved. Still with LBP. Underwent MRI this morning which has now been read since by visit with him -> shows significant disc bulge L5-S1 level which is suspicious for diskitis.  Dialyzes at [facility] clinic on TTS schedule. Last HD there was 6/30, then dialyzed at [facility] on 7/3. He has an AVF as his access - no recent issues or infections. He is periodically non-compliance with his dialysis and medications.  Past Medical History:  Diagnosis Date   A-V fistula ([place])    Right arm   Abdominal hernia    AKI (acute kidney injury) ([place]) 01/2015   Anasarca    Anemia    Asthma    as a child   Bilateral inguinal hernia    Bilateral pleural effusion 01/2017   Cellulitis 01/16/2017   Bilateral lower extremity   CHF (congestive heart failure) (H[facility])    Chronic venous insufficiency    Concentric left ventricular hypertrophy 08/17/2017   Moderated, noted on ECHO   Diastolic dysfunction 16/10/9602   noted on ECHO   Elevated LFTs    per patient, resolved   End stage renal disease ([place])    Dialysis T/Th/Sa  Started 02/2017/pt is waiting for a kidney transplant   H/O  right inguinal hernia repair 11/2017   History of colonoscopy 03/20/2018   History of elevated PSA 10/2017   Hypertension    Pneumonia    Pulmonary hypertension ([place])    Moderate   Venous stasis ulcer ([place])    bil legs/ healed up now per pt (03/06/18)   Past Surgical History:  Procedure Laterality Date   AV FISTULA PLACEMENT Right 01/20/2017   Procedure: ARTERIOVENOUS (AV) FISTULA CREATION;  Surgeon: Angelia Mould, MD;  Location: [place];  Service: Vascular;  Laterality: Right;   HERNIA REPAIR     INSERTION OF DIALYSIS CATHETER Right 01/20/2017   Procedure: INSERTION OF DIALYSIS CATHETER;  Surgeon: Angelia Mould, MD;  Location: [place];  Service: Vascular;  Laterality: Right;   INSERTION OF MESH N/A 11/18/2017   Procedure: INSERTION OF MESH;  Surgeon: Michael Boston, MD;  Location: [facility];  Service: General;  Laterality: N/A;   IR FLUORO GUIDE CV LINE RIGHT  01/18/2017   IR US GUIDE VASC ACCESS RIGHT  01/18/2017   LAPAROSCOPIC INGUINAL HERNIA WITH UMBILICAL HERNIA Bilateral 11/18/2017   Procedure: LAPAROSCOPIC BILATERAL INGUINAL HERNIA REPAIR WITH UMBILICAL HERNIA REPAIR WITH MESH;  Surgeon: Michael Boston, MD;  Location: [facility];  Service: General;  Laterality: Bilateral;   TOE SURGERY     right fracture in big toe   TONSILLECTOMY     Family History  Problem Relation Age of Onset   Heart attack Mother 53       CABG hx   Heart disease Mother    Hypertension Mother    Stroke Mother    Diabetes Father    Healthy Brother    Prostate cancer Maternal Grandfather    Colon cancer Neg Hx    Colon polyps Neg Hx    Esophageal cancer Neg Hx    Stomach cancer Neg Hx    Rectal cancer Neg Hx    Social History:  reports that he quit smoking about 25 years ago. He has never used smokeless tobacco. He reports previous alcohol use. He reports previous drug use.  ROS: As per HPI otherwise negative.  Physical Exam: Vitals:   07/18/20 0430 07/18/20 0500 07/18/20 0531 07/18/20 0700   BP: (!) 172/103 (!) 164/104 (!) (P) 189/115 (!) 190/104  Pulse: 65 66 (P) 64 70  Resp:    18  Temp:   (!) (P) 97.5 F (36.4 C) 98.1 F (36.7 C)  TempSrc:   (P) Oral Oral  SpO2:   (P) 100% 90%     General: Well developed, well nourished, in no acute distress. On nasal O2. Head: Normocephalic, atraumatic, sclera non-icteric, mucus membranes are moist. Neck: Supple without lymphadenopathy/masses. JVD not elevated. Lungs: Clear bilaterally to auscultation without wheezes, rales, or rhonchi. Breathing is unlabored. Heart: RRR with normal S1, S2. No murmurs, rubs, or gallops appreciated. Abdomen: Soft, non-tender, non-distended with normoactive bowel sounds. No rebound/guarding. No obvious abdominal masses. Musculoskeletal:  Strength and tone appear normal for age. Lower extremities: No edema or ischemic changes, no open wounds. Neuro: Alert and oriented X 3. Moves all extremities spontaneously. Psych:  Responds to questions appropriately with a normal affect. Dialysis Access: RUE AVF (bandaged) + thrill  Allergies  Allergen Reactions   Lisinopril Cough   Prior to Admission medications   Medication Sig Start Date End Date Taking? Authorizing Provider  acetaminophen (TYLENOL) 500 MG tablet Take 1,000 mg by mouth every 6 (six) hours as needed for moderate pain or headache.   Yes [provider]  amLODipine (NORVASC) 10 MG tablet Take 10 mg by mouth every evening. 06/20/18  Yes [provider]  carvedilol (COREG) 25 MG tablet Take 1 tablet (25 mg total) by mouth 2 (two) times daily with a meal. 02/02/18  Yes [name], FNP  doxercalciferol (HECTOROL) 0.5 MCG capsule Doxercalciferol (Hectorol) 02/21/20 02/19/21 Yes [provider]  hydrALAZINE (APRESOLINE) 100 MG tablet Take 1 tablet (100 mg total) by mouth 3 (three) times daily. 11/03/19 07/17/20 Yes Pahwani, Einar Grad, MD  iron sucrose in sodium chloride 0.9 % 100 mL Iron Sucrose (Venofer) 02/26/20 02/17/21 Yes [provider]  Methoxy PEG-Epoetin Beta (MIRCERA IJ) Mircera 02/12/20 02/10/21 Yes [provider]  multivitamin (RENA-VIT) TABS tablet Take 1 tablet by mouth daily. 10/20/17  Yes [provider]  polyvinyl alcohol (LIQUIFILM TEARS) 1.4 % ophthalmic solution Place 1 drop into both eyes as needed for dry eyes.   Yes [provider]  sevelamer carbonate (RENVELA) 800 MG tablet Take 2,400-4,000 mg by mouth 3 (three) times daily. 01/15/20  Yes [provider]  HYDROcodone-acetaminophen (NORCO/VICODIN) 5-325 MG tablet Take 1 tablet by mouth every 6 (six) hours as needed for severe pain. Patient not taking: No sig reported 07/13/20   Truddie Hidden, MD  methocarbamol (ROBAXIN) 500 MG tablet Take 1 tablet (500 mg total) by mouth 2 (two) times daily. Patient not taking: No  sig reported 07/13/20   Truddie Hidden, MD   Current Facility-Administered Medications  Medication Dose Route Frequency Provider Last Rate Last Admin   acetaminophen (TYLENOL) tablet 650 mg  650 mg Oral Q6H PRN Etta Quill, DO       Or   acetaminophen (TYLENOL) suppository 650 mg  650 mg Rectal Q6H PRN Etta Quill, DO       amLODipine (NORVASC) tablet 10 mg  10 mg Oral QPM Jennette Kettle M, DO       carvedilol (COREG) tablet 25 mg  25 mg Oral BID WC Jennette Kettle M, DO       Chlorhexidine Gluconate Cloth 2 % PADS 6 each  6 each Topical Q0600 Loren Racer, PA-C       heparin injection 5,000 Units  5,000 Units Subcutaneous Q8H Alcario Drought, Jared M, DO       hydrALAZINE (APRESOLINE) tablet 100 mg  100 mg Oral TID Etta Quill, DO       HYDROcodone-acetaminophen (NORCO/VICODIN) 5-325 MG per tablet 1 tablet  1 tablet Oral Q6H PRN Etta Quill, DO       methocarbamol (ROBAXIN) tablet 500 mg  500 mg Oral Q6H PRN Etta Quill, DO       multivitamin (RENA-VIT) tablet 1 tablet  1 tablet Oral QHS Jennette Kettle M, DO       ondansetron [facility]) tablet 4 mg  4 mg Oral Q6H PRN Etta Quill, DO       Or   ondansetron [facility]) injection 4 mg  4 mg Intravenous Q6H PRN Etta Quill, DO       polyvinyl alcohol (LIQUIFILM TEARS) 1.4 % ophthalmic solution 1 drop  1 drop Both Eyes PRN Etta Quill, DO       sevelamer carbonate (RENVELA) tablet 2,400-4,000 mg  2,400-4,000 mg Oral TID WC Etta Quill, DO       sodium zirconium cyclosilicate (LOKELMA) packet 10 g  10 g Oral Daily Alfredia Client, PA-C   10 g at 07/17/20 2203   Labs: Basic Metabolic Panel: Recent Labs  Lab 07/13/20 1153 07/17/20 1201 07/17/20 2329 07/18/20 0628  NA 136 136 136 135  K 6.3* 6.1* 5.2* 3.6  CL 99 97* 96* 94*  CO2 23 23 20* 29  GLUCOSE 108* 114* 132* 94  BUN 97* 126* 134* 52*  CREATININE 13.69* 17.08* 17.14* 8.66*  CALCIUM 9.1 8.4* 8.3* 8.3*  PHOS 11.3*  --   --   --    Liver Function Tests: Recent Labs  Lab 07/13/20 0935 07/13/20 1153 07/17/20 1201  AST 19  --  21  ALT 21  --  23  ALKPHOS 34*  --  35*  BILITOT 0.7  --  0.8  PROT 6.3*  --  6.4*  ALBUMIN 3.5 3.5 3.3*   CBC: Recent Labs  Lab 07/13/20 0935 07/13/20 1153 07/17/20 1201 07/18/20 0628  WBC 6.8 6.6 4.7 4.2  NEUTROABS 5.6  --  3.6  --   HGB 10.1* 10.3* 9.5* 9.6*  HCT 31.5* 31.6* 29.0* 28.6*  MCV 95.2 94.9 93.2 90.5  PLT 220 200 201 212   Studies/Results: DG Chest 2 View  Result Date: 07/17/2020 CLINICAL DATA:  60 year old male with shortness of breath and cough for 1 week. EXAM: CHEST - 2 VIEW COMPARISON:  July 13, 2020. FINDINGS: Image mildly rotated to the LEFT. Accounting for this cardiomediastinal contours and hilar structures are stable. No lobar consolidation or sign  of pleural effusion. Signs of vascular crowding/pulmonary vascular congestion. No visible pneumothorax. No effusion. On limited assessment no acute skeletal process. IMPRESSION: Cardiomegaly with pulmonary vascular congestion as before. Electronically Signed   By: Zetta Bills M.D.   On: 07/17/2020 12:54   DG Lumbar Spine  Complete  Result Date: 07/17/2020 CLINICAL DATA:  Back pain EXAM: LUMBAR SPINE - COMPLETE 4+ VIEW COMPARISON:  None. FINDINGS: Straightening of the lumbar spine. Vertebral body heights are maintained. Moderate disc space narrowing and degenerative change at L5-S1. Facet degenerative changes of the lower lumbar spine. IMPRESSION: Straightening of the lumbar spine with moderate to marked degenerative change at L5-S1 Electronically Signed   By: Donavan Foil M.D.   On: 07/17/2020 21:48    Dialysis Orders:  TTS @ [facility] -> last HD there on 6/30, dialyzed at [facility] 7/3 4:15hr, 200dialyzer, 400/800, EDW 76kg, 2K/2Ca, AVF, heparin 6000 - Hectoral 66mcg IV q HD - Venofer 50/week  Assessment/Plan:  Back pain: L5-S1 bulging disk, suspicious for diskitis per imaging, but no recent known bacteremic events -> would recommend checking Blood Cx.  ESRD:  S/p HD overnight last night - back to TTS schedule now, HD tomorrow if still  inpatient.  Hypertension/volume: BP high, resume home meds - still about 3kg up from EDW, will try to ^ goal with next HD.  Anemia: Hgb 9.6 - will give low dose Aranesp with next dialysis.  Metabolic bone disease: Ca ok, Phos usually sky high - continue home bidners.   Veneta Penton, PA-C 07/18/2020, 9:58 AM  [facility]  Seen and examined independently.  Agree with note and exam as documented above by physician extender and as noted here.  States hard to get to outpatient dry weight.   General adult male in bed in no acute distress HEENT normocephalic atraumatic extraocular movements intact sclera anicteric Neck supple trachea midline Lungs clear to auscultation bilaterally normal work of breathing at rest  Heart S1S2 no rub Abdomen soft nontender nondistended Extremities no edema  Psych normal mood and affect Neuro - alert and oriented x 3 provides hx and follows commands Access RUE AVF with bruit and thrill ;aneurysmal at baseline per pt  Low back pain - agree  with recommendation for blood cultures; imaging findings are moderately suspicious for diskitis and early osteo  ESRD - usually TTS schedule.  HD tomorrow.   Hyperkalemia  note lokelma is ordered daily - can discontinue HTN - continue home regimen and optimize volume with HD  Anemia CKD - on ESA Metabolic bone disease - continue home binders; note on hectorol  Claudia Desanctis, MD 07/18/2020 2:18 PM

## 2020-07-18 NOTE — Progress Notes (Signed)
New Admission Note:  Arrival Method: By stretcher from HD, do not have bed in room yet to transfer to, HD went ahead and sent. Bed is being cleaned. Pt states he is okay in stretcher to wait on bed.  Mental Orientation: Alert and oriented Telemetry: Box 9 Assessment: Completed Skin: Completed, refer to flowsheets IV: None, order in to get one Pain: Denies Tubes: None Safety Measures: Safety Fall Prevention Plan was given, discussed and signed. Admission: Completed [facility] Orientation: Patient has been orientated to the room, unit and the staff. Family: None  Orders have been reviewed and implemented. Will continue to monitor the patient. Call light has been placed within reach and bed alarm has been activated. Pt went ahead and ordered breakfast.   Perry Mount, RN  Phone Number: [phone]

## 2020-07-18 NOTE — Progress Notes (Signed)
Interventional Radiology Brief Note:  Patient admitted with back pain.  Concern on MR for discitis/osteomyelitis.  IR consulted for aspiration.  Case reviewed by Dr. Karenann Cai.  Aspiration with local anesthetic only may be possible today.   PA to bedside to discuss with patient.  He states that he has not had much back pain since admission yesterday and is overall feeling better.  States he only had back pain while getting moved for MR scan this AM. Refuses aspiration for now stating "I don't want to do anything extensive right now."  When encouraged that this study may help the care team, he states "I know how to come back if things change."   Care team notified.  No procedure planned in IR at present.  IR remains available early next week if aspiration revisited and patient agreeable.   Brynda Greathouse, MS RD PA-C 3:37 PM

## 2020-07-19 DIAGNOSIS — M4646 Discitis, unspecified, lumbar region: Secondary | ICD-10-CM | POA: Diagnosis not present

## 2020-07-19 DIAGNOSIS — M869 Osteomyelitis, unspecified: Secondary | ICD-10-CM | POA: Diagnosis present

## 2020-07-19 DIAGNOSIS — I5042 Chronic combined systolic (congestive) and diastolic (congestive) heart failure: Secondary | ICD-10-CM | POA: Diagnosis not present

## 2020-07-19 DIAGNOSIS — Z833 Family history of diabetes mellitus: Secondary | ICD-10-CM | POA: Diagnosis not present

## 2020-07-19 DIAGNOSIS — E875 Hyperkalemia: Secondary | ICD-10-CM | POA: Diagnosis present

## 2020-07-19 DIAGNOSIS — I5033 Acute on chronic diastolic (congestive) heart failure: Secondary | ICD-10-CM | POA: Diagnosis present

## 2020-07-19 DIAGNOSIS — Z823 Family history of stroke: Secondary | ICD-10-CM | POA: Diagnosis not present

## 2020-07-19 DIAGNOSIS — Z79899 Other long term (current) drug therapy: Secondary | ICD-10-CM | POA: Diagnosis not present

## 2020-07-19 DIAGNOSIS — J9601 Acute respiratory failure with hypoxia: Secondary | ICD-10-CM | POA: Diagnosis present

## 2020-07-19 DIAGNOSIS — Z20822 Contact with and (suspected) exposure to covid-19: Secondary | ICD-10-CM | POA: Diagnosis present

## 2020-07-19 DIAGNOSIS — M545 Low back pain, unspecified: Secondary | ICD-10-CM | POA: Diagnosis not present

## 2020-07-19 DIAGNOSIS — Z8249 Family history of ischemic heart disease and other diseases of the circulatory system: Secondary | ICD-10-CM | POA: Diagnosis not present

## 2020-07-19 DIAGNOSIS — Z87891 Personal history of nicotine dependence: Secondary | ICD-10-CM | POA: Diagnosis not present

## 2020-07-19 DIAGNOSIS — I739 Peripheral vascular disease, unspecified: Secondary | ICD-10-CM | POA: Diagnosis present

## 2020-07-19 DIAGNOSIS — G8929 Other chronic pain: Secondary | ICD-10-CM | POA: Diagnosis present

## 2020-07-19 DIAGNOSIS — I132 Hypertensive heart and chronic kidney disease with heart failure and with stage 5 chronic kidney disease, or end stage renal disease: Secondary | ICD-10-CM | POA: Diagnosis present

## 2020-07-19 DIAGNOSIS — Z888 Allergy status to other drugs, medicaments and biological substances status: Secondary | ICD-10-CM | POA: Diagnosis not present

## 2020-07-19 DIAGNOSIS — N186 End stage renal disease: Secondary | ICD-10-CM | POA: Diagnosis present

## 2020-07-19 DIAGNOSIS — N2581 Secondary hyperparathyroidism of renal origin: Secondary | ICD-10-CM | POA: Diagnosis present

## 2020-07-19 DIAGNOSIS — Z9115 Patient's noncompliance with renal dialysis: Secondary | ICD-10-CM | POA: Diagnosis not present

## 2020-07-19 DIAGNOSIS — Z992 Dependence on renal dialysis: Secondary | ICD-10-CM | POA: Diagnosis not present

## 2020-07-19 DIAGNOSIS — D631 Anemia in chronic kidney disease: Secondary | ICD-10-CM | POA: Diagnosis present

## 2020-07-19 LAB — BASIC METABOLIC PANEL
Anion gap: 12 (ref 5–15)
BUN: 71 mg/dL — ABNORMAL HIGH (ref 6–20)
CO2: 29 mmol/L (ref 22–32)
Calcium: 8.5 mg/dL — ABNORMAL LOW (ref 8.9–10.3)
Chloride: 95 mmol/L — ABNORMAL LOW (ref 98–111)
Creatinine, Ser: 11.21 mg/dL — ABNORMAL HIGH (ref 0.61–1.24)
GFR, Estimated: 5 mL/min — ABNORMAL LOW (ref >60)
Glucose, Bld: 105 mg/dL — ABNORMAL HIGH (ref 70–99)
Potassium: 4.5 mmol/L (ref 3.5–5.1)
Sodium: 136 mmol/L (ref 135–145)

## 2020-07-19 LAB — CBC
HCT: 28.6 % — ABNORMAL LOW (ref 39.0–52.0)
Hemoglobin: 9.8 g/dL — ABNORMAL LOW (ref 13.0–17.0)
MCH: 30.7 pg (ref 26.0–34.0)
MCHC: 34.3 g/dL (ref 30.0–36.0)
MCV: 89.7 fL (ref 80.0–100.0)
Platelets: 211 10*3/uL (ref 150–400)
RBC: 3.19 MIL/uL — ABNORMAL LOW (ref 4.22–5.81)
RDW: 12.8 % (ref 11.5–15.5)
WBC: 3.9 10*3/uL — ABNORMAL LOW (ref 4.0–10.5)
nRBC: 0 % (ref 0.0–0.2)

## 2020-07-19 LAB — C-REACTIVE PROTEIN: CRP: 3.6 mg/dL — ABNORMAL HIGH (ref <1.0)

## 2020-07-19 LAB — SEDIMENTATION RATE: Sed Rate: 12 mm/hr (ref 0–16)

## 2020-07-19 MED ORDER — DARBEPOETIN ALFA 25 MCG/0.42ML IJ SOSY
PREFILLED_SYRINGE | INTRAMUSCULAR | Status: AC
  Filled 2020-07-19: qty 0.42

## 2020-07-19 MED ORDER — DOXERCALCIFEROL 4 MCG/2ML IV SOLN
9.0000 ug | INTRAVENOUS | Status: DC
  Filled 2020-07-19: qty 6

## 2020-07-19 MED ORDER — DARBEPOETIN ALFA 25 MCG/0.42ML IJ SOSY
25.0000 ug | PREFILLED_SYRINGE | INTRAMUSCULAR | Status: DC
  Administered 2020-07-19: 25 ug via INTRAVENOUS

## 2020-07-19 NOTE — Procedures (Addendum)
Seen and examined on dialysis.  Procedure supervised.  Blood pressure 173/107.  BF 400. Tolerating goal - increased goal from 4 to 5 kg and spoke with RN.  On 3 liters of oxygen now and was satting 85% on room air on arrival per RN.   Claudia Desanctis, MD 07/19/2020 8:59 AM

## 2020-07-19 NOTE — Progress Notes (Addendum)
[facility] Progress Note   Subjective:     Patient seen and examined on HD. Tolerating goal so far-BP sys 150s. Denies SOB, CP, ABD pain, and N/V.   Objective Vitals:   07/19/20 0927 07/19/20 0952 07/19/20 1028 07/19/20 1051  BP: (!) 169/102 (!) 185/107 (!) 155/92 (!) 151/94  Pulse: 65 66 65 67  Resp:      Temp:      TempSrc:      SpO2:      Weight:       Physical Exam General: Well-appearing male; NAD Heart:Normal S1 and S2; No murmurs, gallops, or rubs Lungs: Clear anteriorly and laterally; No wheezing, rales, or rhonchi Abdomen: Soft and non-tender Extremities:No edema BLLE Dialysis Access: RUE AVF (+) thrill   Filed Weights   07/19/20 0825  Weight: 82 kg    Intake/Output Summary (Last 24 hours) at 07/19/2020 1059 Last data filed at 07/18/2020 1700 Gross per 24 hour  Intake 240 ml  Output --  Net 240 ml    Additional Objective Labs: Basic Metabolic Panel: Recent Labs  Lab 07/13/20 1153 07/17/20 1201 07/17/20 2329 07/18/20 0628 07/19/20 0209  NA 136   < > 136 135 136  K 6.3*   < > 5.2* 3.6 4.5  CL 99   < > 96* 94* 95*  CO2 23   < > 20* 29 29  GLUCOSE 108*   < > 132* 94 105*  BUN 97*   < > 134* 52* 71*  CREATININE 13.69*   < > 17.14* 8.66* 11.21*  CALCIUM 9.1   < > 8.3* 8.3* 8.5*  PHOS 11.3*  --   --   --   --    < > = values in this interval not displayed.   Liver Function Tests: Recent Labs  Lab 07/13/20 0935 07/13/20 1153 07/17/20 1201  AST 19  --  21  ALT 21  --  23  ALKPHOS 34*  --  35*  BILITOT 0.7  --  0.8  PROT 6.3*  --  6.4*  ALBUMIN 3.5 3.5 3.3*   No results for input(s): LIPASE, AMYLASE in the last 168 hours. CBC: Recent Labs  Lab 07/13/20 0935 07/13/20 1153 07/17/20 1201 07/18/20 0628 07/19/20 0209  WBC 6.8 6.6 4.7 4.2 3.9*  NEUTROABS 5.6  --  3.6  --   --   HGB 10.1* 10.3* 9.5* 9.6* 9.8*  HCT 31.5* 31.6* 29.0* 28.6* 28.6*  MCV 95.2 94.9 93.2 90.5 89.7  PLT 220 200 201 212 211   Blood Culture     Component Value Date/Time   SDES BLOOD LEFT ANTECUBITAL 07/18/2020 1116   SDES BLOOD LEFT HAND 07/18/2020 1116   SPECREQUEST  07/18/2020 1116    BOTTLES DRAWN AEROBIC AND ANAEROBIC Blood Culture adequate volume   SPECREQUEST  07/18/2020 1116    BOTTLES DRAWN AEROBIC AND ANAEROBIC Blood Culture adequate volume   CULT  07/18/2020 1116    NO GROWTH < 24 HOURS Performed at [facility], [place] [address] [place], [place] [address]    CULT  07/18/2020 1116    NO GROWTH < 24 HOURS Performed at [facility], [place] [address], [place], [place] [address]    REPTSTATUS PENDING 07/18/2020 1116   REPTSTATUS PENDING 07/18/2020 1116    Cardiac Enzymes: No results for input(s): CKTOTAL, CKMB, CKMBINDEX, TROPONINI in the last 168 hours. CBG: No results for input(s): GLUCAP in the last 168 hours. Iron Studies: No results for  input(s): IRON, TIBC, TRANSFERRIN, FERRITIN in the last 72 hours. Lab Results  Component Value Date   INR 1.2 10/30/2019   Studies/Results: DG Chest 2 View  Result Date: 07/17/2020 CLINICAL DATA:  60 year old male with shortness of breath and cough for 1 week. EXAM: CHEST - 2 VIEW COMPARISON:  July 13, 2020. FINDINGS: Image mildly rotated to the LEFT. Accounting for this cardiomediastinal contours and hilar structures are stable. No lobar consolidation or sign of pleural effusion. Signs of vascular crowding/pulmonary vascular congestion. No visible pneumothorax. No effusion. On limited assessment no acute skeletal process. IMPRESSION: Cardiomegaly with pulmonary vascular congestion as before. Electronically Signed   By: Zetta Bills M.D.   On: 07/17/2020 12:54   DG Lumbar Spine Complete  Result Date: 07/17/2020 CLINICAL DATA:  Back pain EXAM: LUMBAR SPINE - COMPLETE 4+ VIEW COMPARISON:  None. FINDINGS: Straightening of the lumbar spine. Vertebral body heights are maintained. Moderate disc space narrowing and degenerative change at L5-S1. Facet degenerative changes of  the lower lumbar spine. IMPRESSION: Straightening of the lumbar spine with moderate to marked degenerative change at L5-S1 Electronically Signed   By: Donavan Foil M.D.   On: 07/17/2020 21:48   MR LUMBAR SPINE WO CONTRAST  Result Date: 07/18/2020 CLINICAL DATA:  Weakness and back pain. History end-stage renal disease and anemia. Infection suspected. EXAM: MRI LUMBAR SPINE WITHOUT CONTRAST TECHNIQUE: Multiplanar, multisequence MR imaging of the lumbar spine was performed. No intravenous contrast was administered. COMPARISON:  Lumbar spine radiographs 07/17/2020. FINDINGS: Segmentation: Conventional anatomy assumed, with the last open disc space designated L5-S1. Alignment:  Straightening without focal angulation or listhesis. Vertebrae: As demonstrated radiographically, there is advanced disc space narrowing at L5-S1 with endplate osteophytes. There is prominent T2 hyperintensity within the L5-S1 disc, and there are associated endplate erosions and edema. No other suspicious disc space findings. The visualized sacroiliac joints appear unremarkable. Conus medullaris: Extends to the T12-L1 level and appears normal. Paraspinal and other soft tissues: Mild paraspinal inflammatory changes anteriorly at L5-S1 without focal fluid collection. Disc levels: L1-2: Disc height and hydration are maintained. Mild anterior osteophyte formation. No spinal stenosis or nerve root encroachment. L2-3: Mild disc bulging and anterior osteophyte formation. Mild facet hypertrophy. No spinal stenosis or nerve root encroachment. L3-4: The disc appears normal. Mild facet hypertrophy. No spinal stenosis or nerve root encroachment. L4-5: The disc appears normal. Mild bilateral facet hypertrophy. No spinal stenosis or nerve root encroachment. L5-S1: As above, chronic degenerative disc disease with loss of disc height, annular disc bulging and endplate osteophytes. Moderate facet and ligamentous hypertrophy. Moderate foraminal narrowing is  present bilaterally. There is no epidural fluid collection. IMPRESSION: 1. Nonspecific, but suspicious, disc and endplate changes at B0-S1 as described. Findings could relate to dialysis-related spondyloarthropathy, although the paraspinal inflammatory changes are moderately suspicious for diskitis and early osteomyelitis. Consider disc space aspiration and/or short-term MR follow-up without and with contrast. 2. No focal fluid collection or epidural inflammation identified. 3. Moderate foraminal narrowing bilaterally at L5-S1. 4. No other significant disc space findings. Electronically Signed   By: Richardean Sale M.D.   On: 07/18/2020 10:05    Medications:   amLODipine  10 mg Oral QPM   carvedilol  25 mg Oral BID WC   Chlorhexidine Gluconate Cloth  6 each Topical Q0600   heparin  5,000 Units Subcutaneous Q8H   hydrALAZINE  100 mg Oral TID   multivitamin  1 tablet Oral QHS   sevelamer carbonate  2,400-4,000 mg Oral TID WC  Dialysis Orders: TTS @ [facility] -> last HD there on 6/30, dialyzed at [facility] 7/3 4:15hr, 200dialyzer, 400/800, EDW 76kg, 2K/2Ca, AVF, heparin 6000 - Hectoral 27mcg IV q HD - Venofer 50/week -Micera 5mcg q 2 weeks-last dose 07/01/20  Assessment/Plan: Back pain: L5-S1 bulging disk, suspicious for diskitis per imaging, but no recent known bacteremic events -> blood cultures from 7/8-no growth > 24hrs.  ESRD:  S/p HD overnight last night - back to TTS schedule now, receiving HD today. Hypertension/volume: BP high, resume home meds - still about 3kg up from EDW, UFG increased to 5.5L today-BP appears to be improving on HD-now sys 150s-push UF and monitor BP trends. . Anemia: Hgb 9.8 - will give low dose Aranesp today with HD 7/9. Metabolic bone disease: Ca ok, Phos usually sky high - continue home bidners.  Tobie Poet, NP [facility] 07/19/2020,10:59 AM  LOS: 0 days    Seen and examined independently.  Agree with note and exam as documented above by  physician extender and as noted here.  General adult male in bed in no acute distress HEENT normocephalic atraumatic extraocular movements intact sclera anicteric Neck supple trachea midline Lungs clear to auscultation bilaterally normal work of breathing at rest Heart S1S2 no rub Abdomen soft nontender nondistended Extremities no edema Psych normal mood and affect Neuro - alert and oriented x 3 provides hx and follows commands Access RUE AVF in use on exam this am   Low back pain - blood cultures as above; imaging findings are moderately suspicious for diskitis and early osteo   ESRD - HD per TTS schedule   Hyperkalemia  improved with HD  HTN - continue home regimen and optimize volume with HD  Anemia CKD - on ESA  Metabolic bone disease - continue home binders; start back hectorol  See also procedure note from today  Claudia Desanctis, MD 07/19/2020  2:58 PM

## 2020-07-19 NOTE — Progress Notes (Signed)
PROGRESS NOTE  Jason [name]. DDU:[id number] DOB: Mar 28, 1960 DOA: 07/17/2020 PCP: [name], FNP (Inactive)   LOS: 0 days   Brief Narrative / Interim history: 60 year old male with history of ESRD on TTS dialysis, HTN, moderate PAH, chronic diastolic CHF comes into the hospital with generalized weakness, low back pain as well as shortness of breath.  Patient states that he experienced severe back pain about a week ago, somewhat out of the blue as he does not recall any trauma or doing heavy work.  Pain was so severe that he was unable to get out of bed, move around, and unable to make it to his dialysis sessions  Subjective / 24h Interval events: Refused CT guided aspiration yesterday but continues to have back pain. He doesn't want to take pills. He is now agreeable to IR to aspirate / biopsy   Assessment & Plan: Principal Problem Acute hypoxic respiratory failure in the setting of acute pulmonary edema due to acute on chronic diastolic CHF in the setting of missed dialysis-nephrology consulted, he was taken to dialysis emergently overnight last night and he is much improved this morning.  -briefly on oxygen again this morning prior to HD  Active Problems ESRD, hyperkalemia-dialysis per nephrology Suspect discitis/osteomyelitis-MRI done this morning without contrast showed an area at the L5/S1 suspect for discitis/osteomyelitis.  Patient without any fever or chills in the last several weeks, not an IV drug user. -initially refused IR procedure, now agreeable. Re-consulted IR today  -Consulted ID, I sent blood cultures and continue to closely monitor off antibiotics. Cultures negative so far. D/w Dr Baxter Flattery Essential hypertension-continue home regimen Anemia in the setting of end-stage renal disease-hemoglobin stable, monitor   Scheduled Meds:  amLODipine  10 mg Oral QPM   carvedilol  25 mg Oral BID WC   Chlorhexidine Gluconate Cloth  6 each Topical Q0600   Darbepoetin Alfa        darbepoetin (ARANESP) injection - DIALYSIS  25 mcg Intravenous Q Sat-HD   heparin  5,000 Units Subcutaneous Q8H   hydrALAZINE  100 mg Oral TID   multivitamin  1 tablet Oral QHS   sevelamer carbonate  2,400-4,000 mg Oral TID WC   Continuous Infusions: PRN Meds:.acetaminophen **OR** acetaminophen, hydrALAZINE, HYDROcodone-acetaminophen, methocarbamol, ondansetron **OR** ondansetron (ZOFRAN) IV, polyvinyl alcohol  Diet Orders (From admission, onward)     Start     Ordered   07/17/20 2236  Diet renal with fluid restriction Fluid restriction: 1200 mL Fluid; Room service appropriate? Yes; Fluid consistency: Thin  Diet effective now       Question Answer Comment  Fluid restriction: 1200 mL Fluid   Room service appropriate? Yes   Fluid consistency: Thin      07/17/20 2235            DVT prophylaxis: heparin injection 5,000 Units Start: 07/17/20 2245     Code Status: Full Code  Family Communication: No family at bedside  Status is: Observation  The patient will require care spanning > 2 midnights and should be moved to inpatient because: Ongoing diagnostic testing needed not appropriate for outpatient work up and Inpatient level of care appropriate due to severity of illness  Dispo: The patient is from: Home              Anticipated d/c is to: Home              Patient currently is not medically stable to d/c.   Difficult to place patient No  Level of care: Telemetry Medical  Consultants:  ID IR Nephrology   Procedures:  None   Microbiology  Blood cultures 7/8   Antimicrobials: none    Objective: Vitals:   07/19/20 1051 07/19/20 1100 07/19/20 1130 07/19/20 1156  BP: (!) 151/94 133/85 140/87 (!) 162/99  Pulse: 67 64 66 65  Resp:    17  Temp:    98.2 F (36.8 C)  TempSrc:    Oral  SpO2:    96%  Weight:    77 kg    Intake/Output Summary (Last 24 hours) at 07/19/2020 1313 Last data filed at 07/19/2020 1156 Gross per 24 hour  Intake 240 ml  Output 5000  ml  Net -4760 ml    Filed Weights   07/19/20 0825 07/19/20 1156  Weight: 82 kg 77 kg    Examination:  Constitutional: nad Eyes: no icterus ENMT: mmm Neck: normal, supple Respiratory: cta biL, no wheezing Cardiovascular: rrr, no mrg Abdomen: soft, nt, nd, bs+ Musculoskeletal: no clubbing / cyanosis.  Skin: no rashes Neurologic: non focal   Data Reviewed: I have independently reviewed following labs and imaging studies   CBC: Recent Labs  Lab 07/13/20 0935 07/13/20 1153 07/17/20 1201 07/18/20 0628 07/19/20 0209  WBC 6.8 6.6 4.7 4.2 3.9*  NEUTROABS 5.6  --  3.6  --   --   HGB 10.1* 10.3* 9.5* 9.6* 9.8*  HCT 31.5* 31.6* 29.0* 28.6* 28.6*  MCV 95.2 94.9 93.2 90.5 89.7  PLT 220 200 201 212 811    Basic Metabolic Panel: Recent Labs  Lab 07/13/20 1153 07/17/20 1201 07/17/20 2329 07/18/20 0628 07/19/20 0209  NA 136 136 136 135 136  K 6.3* 6.1* 5.2* 3.6 4.5  CL 99 97* 96* 94* 95*  CO2 23 23 20* 29 29  GLUCOSE 108* 114* 132* 94 105*  BUN 97* 126* 134* 52* 71*  CREATININE 13.69* 17.08* 17.14* 8.66* 11.21*  CALCIUM 9.1 8.4* 8.3* 8.3* 8.5*  PHOS 11.3*  --   --   --   --     Liver Function Tests: Recent Labs  Lab 07/13/20 0935 07/13/20 1153 07/17/20 1201  AST 19  --  21  ALT 21  --  23  ALKPHOS 34*  --  35*  BILITOT 0.7  --  0.8  PROT 6.3*  --  6.4*  ALBUMIN 3.5 3.5 3.3*    Coagulation Profile: No results for input(s): INR, PROTIME in the last 168 hours. HbA1C: No results for input(s): HGBA1C in the last 72 hours. CBG: No results for input(s): GLUCAP in the last 168 hours.  Recent Results (from the past 240 hour(s))  Resp Panel by RT-PCR (Flu A&B, Covid) Nasopharyngeal Swab     Status: None   Collection Time: 07/13/20 12:30 PM   Specimen: Nasopharyngeal Swab; Nasopharyngeal(NP) swabs in vial transport medium  Result Value Ref Range Status   SARS Coronavirus 2 by RT PCR NEGATIVE NEGATIVE Final    Comment: (NOTE) SARS-CoV-2 target nucleic acids  are NOT DETECTED.  The SARS-CoV-2 RNA is generally detectable in upper respiratory specimens during the acute phase of infection. The lowest concentration of SARS-CoV-2 viral copies this assay can detect is 138 copies/mL. A negative result does not preclude SARS-Cov-2 infection and should not be used as the sole basis for treatment or other patient management decisions. A negative result may occur with  improper specimen collection/handling, submission of specimen other than nasopharyngeal swab, presence of viral mutation(s) within the areas targeted by this assay,  and inadequate number of viral copies(<138 copies/mL). A negative result must be combined with clinical observations, patient history, and epidemiological information. The expected result is Negative.  Fact Sheet for Patients:  EntrepreneurPulse.com.au  Fact Sheet for Healthcare Providers:  IncredibleEmployment.be  This test is no t yet approved or cleared by the [place] FDA and  has been authorized for detection and/or diagnosis of SARS-CoV-2 by FDA under an Emergency Use Authorization (EUA). This EUA will remain  in effect (meaning this test can be used) for the duration of the COVID-19 declaration under Section 564(b)(1) of the Act, 21 U.S.C.section 360bbb-3(b)(1), unless the authorization is terminated  or revoked sooner.       Influenza A by PCR NEGATIVE NEGATIVE Final   Influenza B by PCR NEGATIVE NEGATIVE Final    Comment: (NOTE) The Xpert Xpress SARS-CoV-2/FLU/RSV plus assay is intended as an aid in the diagnosis of influenza from Nasopharyngeal swab specimens and should not be used as a sole basis for treatment. Nasal washings and aspirates are unacceptable for Xpert Xpress SARS-CoV-2/FLU/RSV testing.  Fact Sheet for Patients: EntrepreneurPulse.com.au  Fact Sheet for Healthcare Providers: IncredibleEmployment.be  This test is  not yet approved or cleared by the [place] FDA and has been authorized for detection and/or diagnosis of SARS-CoV-2 by FDA under an Emergency Use Authorization (EUA). This EUA will remain in effect (meaning this test can be used) for the duration of the COVID-19 declaration under Section 564(b)(1) of the Act, 21 U.S.C. section 360bbb-3(b)(1), unless the authorization is terminated or revoked.  Performed at [facility], [place] [address] [place], [place] [address]   Resp Panel by RT-PCR (Flu A&B, Covid) Nasopharyngeal Swab     Status: None   Collection Time: 07/17/20  5:29 PM   Specimen: Nasopharyngeal Swab; Nasopharyngeal(NP) swabs in vial transport medium  Result Value Ref Range Status   SARS Coronavirus 2 by RT PCR NEGATIVE NEGATIVE Final    Comment: (NOTE) SARS-CoV-2 target nucleic acids are NOT DETECTED.  The SARS-CoV-2 RNA is generally detectable in upper respiratory specimens during the acute phase of infection. The lowest concentration of SARS-CoV-2 viral copies this assay can detect is 138 copies/mL. A negative result does not preclude SARS-Cov-2 infection and should not be used as the sole basis for treatment or other patient management decisions. A negative result may occur with  improper specimen collection/handling, submission of specimen other than nasopharyngeal swab, presence of viral mutation(s) within the areas targeted by this assay, and inadequate number of viral copies(<138 copies/mL). A negative result must be combined with clinical observations, patient history, and epidemiological information. The expected result is Negative.  Fact Sheet for Patients:  EntrepreneurPulse.com.au  Fact Sheet for Healthcare Providers:  IncredibleEmployment.be  This test is no t yet approved or cleared by the [place] FDA and  has been authorized for detection and/or diagnosis of SARS-CoV-2 by FDA under an Emergency Use  Authorization (EUA). This EUA will remain  in effect (meaning this test can be used) for the duration of the COVID-19 declaration under Section 564(b)(1) of the Act, 21 U.S.C.section 360bbb-3(b)(1), unless the authorization is terminated  or revoked sooner.       Influenza A by PCR NEGATIVE NEGATIVE Final   Influenza B by PCR NEGATIVE NEGATIVE Final    Comment: (NOTE) The Xpert Xpress SARS-CoV-2/FLU/RSV plus assay is intended as an aid in the diagnosis of influenza from Nasopharyngeal swab specimens and should not be used as a sole basis for treatment. Nasal washings  and aspirates are unacceptable for Xpert Xpress SARS-CoV-2/FLU/RSV testing.  Fact Sheet for Patients: EntrepreneurPulse.com.au  Fact Sheet for Healthcare Providers: IncredibleEmployment.be  This test is not yet approved or cleared by the [place] FDA and has been authorized for detection and/or diagnosis of SARS-CoV-2 by FDA under an Emergency Use Authorization (EUA). This EUA will remain in effect (meaning this test can be used) for the duration of the COVID-19 declaration under Section 564(b)(1) of the Act, 21 U.S.C. section 360bbb-3(b)(1), unless the authorization is terminated or revoked.  Performed at [facility], [place] [address], [place], [place] [address]   Culture, blood (Routine X 2) w Reflex to ID Panel     Status: None (Preliminary result)   Collection Time: 07/18/20 11:16 AM   Specimen: BLOOD  Result Value Ref Range Status   Specimen Description BLOOD LEFT ANTECUBITAL  Final   Special Requests   Final    BOTTLES DRAWN AEROBIC AND ANAEROBIC Blood Culture adequate volume   Culture   Final    NO GROWTH < 24 HOURS Performed at [facility], [place] [address], [place], [place] [address]    Report Status PENDING  Incomplete  Culture, blood (Routine X 2) w Reflex to ID Panel     Status: None (Preliminary result)   Collection Time: 07/18/20 11:16 AM    Specimen: BLOOD LEFT HAND  Result Value Ref Range Status   Specimen Description BLOOD LEFT HAND  Final   Special Requests   Final    BOTTLES DRAWN AEROBIC AND ANAEROBIC Blood Culture adequate volume   Culture   Final    NO GROWTH < 24 HOURS Performed at [facility], [place] [address], [place],  [address]    Report Status PENDING  Incomplete      Radiology Studies: No results found.  Marzetta Board, MD, PhD [facility]  Between 7 am - 7 pm I am available, please contact me via Amion (for emergencies) or Securechat (non urgent messages)  Between 7 pm - 7 am I am not available, please contact night coverage MD/APP via Amion

## 2020-07-20 DIAGNOSIS — J9601 Acute respiratory failure with hypoxia: Secondary | ICD-10-CM

## 2020-07-20 LAB — RENAL FUNCTION PANEL
Albumin: 3 g/dL — ABNORMAL LOW (ref 3.5–5.0)
Anion gap: 11 (ref 5–15)
BUN: 51 mg/dL — ABNORMAL HIGH (ref 6–20)
CO2: 27 mmol/L (ref 22–32)
Calcium: 8.7 mg/dL — ABNORMAL LOW (ref 8.9–10.3)
Chloride: 96 mmol/L — ABNORMAL LOW (ref 98–111)
Creatinine, Ser: 9.06 mg/dL — ABNORMAL HIGH (ref 0.61–1.24)
GFR, Estimated: 6 mL/min — ABNORMAL LOW (ref >60)
Glucose, Bld: 98 mg/dL (ref 70–99)
Phosphorus: 8.3 mg/dL — ABNORMAL HIGH (ref 2.5–4.6)
Potassium: 4.4 mmol/L (ref 3.5–5.1)
Sodium: 134 mmol/L — ABNORMAL LOW (ref 135–145)

## 2020-07-20 NOTE — Plan of Care (Signed)
?  Problem: Fluid Volume: ?Goal: Compliance with measures to maintain balanced fluid volume will improve ?Outcome: Progressing ?  ?Problem: Clinical Measurements: ?Goal: Complications related to the disease process, condition or treatment will be avoided or minimized ?Outcome: Progressing ?  ?

## 2020-07-20 NOTE — Progress Notes (Signed)
PROGRESS NOTE  Jason [name]. WOE:[id number] DOB: December 31, 1960 DOA: 07/17/2020 PCP: [name], FNP (Inactive)   LOS: 1 day   Brief Narrative / Interim history: 60 year old male with history of ESRD on TTS dialysis, HTN, moderate PAH, chronic diastolic CHF comes into the hospital with generalized weakness, low back pain as well as shortness of breath.  Patient states that he experienced severe back pain about a week ago, somewhat out of the blue as he does not recall any trauma or doing heavy work.  Pain was so severe that he was unable to get out of bed, move around, and unable to make it to his dialysis sessions  Subjective / 24h Interval events: Still has back pain relieved by medications.  Asking multiple questions again about the CT-guided lumbar disc aspiration  Assessment & Plan: Principal Problem Acute hypoxic respiratory failure in the setting of acute pulmonary edema due to acute on chronic diastolic CHF in the setting of missed dialysis-nephrology consulted, continue dialysis -Hypoxia resolved and currently he is on room air  Active Problems ESRD, hyperkalemia-dialysis per nephrology Suspect discitis/osteomyelitis-MRI done on admission without contrast showed an area at the L5/S1 suspect for discitis/osteomyelitis.  Patient without any fever or chills in the last several weeks, not an IV drug user. -initially refused IR procedure, now agreeable.  Discussed with IR today, keep n.p.o. after midnight -Consulted ID, I sent blood cultures and continue to closely monitor off antibiotics.  Cultures negative so far Essential hypertension-continue home regimen Anemia in the setting of end-stage renal disease-hemoglobin stable, monitor  Scheduled Meds:  amLODipine  10 mg Oral QPM   carvedilol  25 mg Oral BID WC   Chlorhexidine Gluconate Cloth  6 each Topical Q0600   darbepoetin (ARANESP) injection - DIALYSIS  25 mcg Intravenous Q Sat-HD   [START ON 07/22/2020] doxercalciferol  9  mcg Intravenous Q T,Th,Sa-HD   hydrALAZINE  100 mg Oral TID   multivitamin  1 tablet Oral QHS   sevelamer carbonate  2,400-4,000 mg Oral TID WC   Continuous Infusions: PRN Meds:.acetaminophen **OR** acetaminophen, hydrALAZINE, HYDROcodone-acetaminophen, methocarbamol, ondansetron **OR** ondansetron (ZOFRAN) IV, polyvinyl alcohol  Diet Orders (From admission, onward)     Start     Ordered   07/21/20 0001  Diet NPO time specified  Diet effective midnight        07/19/20 1326   07/17/20 2236  Diet renal with fluid restriction Fluid restriction: 1200 mL Fluid; Room service appropriate? Yes; Fluid consistency: Thin  Diet effective now       Question Answer Comment  Fluid restriction: 1200 mL Fluid   Room service appropriate? Yes   Fluid consistency: Thin      07/17/20 2235            DVT prophylaxis:      Code Status: Full Code  Family Communication: No family at bedside  Status is: Observation  The patient will require care spanning > 2 midnights and should be moved to inpatient because: Ongoing diagnostic testing needed not appropriate for outpatient work up and Inpatient level of care appropriate due to severity of illness  Dispo: The patient is from: Home              Anticipated d/c is to: Home              Patient currently is not medically stable to d/c.   Difficult to place patient No   Level of care: Telemetry Medical  Consultants:  ID IR  Nephrology   Procedures:  None   Microbiology  Blood cultures 7/8   Antimicrobials: none    Objective: Vitals:   07/19/20 1230 07/19/20 1725 07/19/20 2025 07/20/20 0502  BP: (!) 160/100 (!) 162/98 (!) 161/93 101/85  Pulse: 72 68 65 70  Resp: 18 17 17 16   Temp: 98.3 F (36.8 C) 98.4 F (36.9 C) 98.2 F (36.8 C) 98.5 F (36.9 C)  TempSrc: Oral Oral Oral Oral  SpO2: 90% 92% 93% 92%  Weight:        Intake/Output Summary (Last 24 hours) at 07/20/2020 1008 Last data filed at 07/20/2020 0200 Gross per 24 hour   Intake 960 ml  Output 5000 ml  Net -4040 ml    Filed Weights   07/19/20 0825 07/19/20 1156  Weight: 82 kg 77 kg    Examination:  Constitutional: Is in no distress, in bed Eyes: No scleral icterus noted ENMT: mmm Neck: normal, supple Respiratory: CTA Cardiovascular:rrr, no mrg Abdomen: nd, bs+ Musculoskeletal: no clubbing / cyanosis.  Skin: No rashes Neurologic: No focal deficits  Data Reviewed: I have independently reviewed following labs and imaging studies   CBC: Recent Labs  Lab 07/13/20 1153 07/17/20 1201 07/18/20 0628 07/19/20 0209  WBC 6.6 4.7 4.2 3.9*  NEUTROABS  --  3.6  --   --   HGB 10.3* 9.5* 9.6* 9.8*  HCT 31.6* 29.0* 28.6* 28.6*  MCV 94.9 93.2 90.5 89.7  PLT 200 201 212 500    Basic Metabolic Panel: Recent Labs  Lab 07/13/20 1153 07/17/20 1201 07/17/20 2329 07/18/20 0628 07/19/20 0209  NA 136 136 136 135 136  K 6.3* 6.1* 5.2* 3.6 4.5  CL 99 97* 96* 94* 95*  CO2 23 23 20* 29 29  GLUCOSE 108* 114* 132* 94 105*  BUN 97* 126* 134* 52* 71*  CREATININE 13.69* 17.08* 17.14* 8.66* 11.21*  CALCIUM 9.1 8.4* 8.3* 8.3* 8.5*  PHOS 11.3*  --   --   --   --     Liver Function Tests: Recent Labs  Lab 07/13/20 1153 07/17/20 1201  AST  --  21  ALT  --  23  ALKPHOS  --  35*  BILITOT  --  0.8  PROT  --  6.4*  ALBUMIN 3.5 3.3*    Coagulation Profile: No results for input(s): INR, PROTIME in the last 168 hours. HbA1C: No results for input(s): HGBA1C in the last 72 hours. CBG: No results for input(s): GLUCAP in the last 168 hours.  Recent Results (from the past 240 hour(s))  Resp Panel by RT-PCR (Flu A&B, Covid) Nasopharyngeal Swab     Status: None   Collection Time: 07/13/20 12:30 PM   Specimen: Nasopharyngeal Swab; Nasopharyngeal(NP) swabs in vial transport medium  Result Value Ref Range Status   SARS Coronavirus 2 by RT PCR NEGATIVE NEGATIVE Final    Comment: (NOTE) SARS-CoV-2 target nucleic acids are NOT DETECTED.  The SARS-CoV-2 RNA  is generally detectable in upper respiratory specimens during the acute phase of infection. The lowest concentration of SARS-CoV-2 viral copies this assay can detect is 138 copies/mL. A negative result does not preclude SARS-Cov-2 infection and should not be used as the sole basis for treatment or other patient management decisions. A negative result may occur with  improper specimen collection/handling, submission of specimen other than nasopharyngeal swab, presence of viral mutation(s) within the areas targeted by this assay, and inadequate number of viral copies(<138 copies/mL). A negative result must be combined with clinical observations,  patient history, and epidemiological information. The expected result is Negative.  Fact Sheet for Patients:  EntrepreneurPulse.com.au  Fact Sheet for Healthcare Providers:  IncredibleEmployment.be  This test is no t yet approved or cleared by the [place] FDA and  has been authorized for detection and/or diagnosis of SARS-CoV-2 by FDA under an Emergency Use Authorization (EUA). This EUA will remain  in effect (meaning this test can be used) for the duration of the COVID-19 declaration under Section 564(b)(1) of the Act, 21 U.S.C.section 360bbb-3(b)(1), unless the authorization is terminated  or revoked sooner.       Influenza A by PCR NEGATIVE NEGATIVE Final   Influenza B by PCR NEGATIVE NEGATIVE Final    Comment: (NOTE) The Xpert Xpress SARS-CoV-2/FLU/RSV plus assay is intended as an aid in the diagnosis of influenza from Nasopharyngeal swab specimens and should not be used as a sole basis for treatment. Nasal washings and aspirates are unacceptable for Xpert Xpress SARS-CoV-2/FLU/RSV testing.  Fact Sheet for Patients: EntrepreneurPulse.com.au  Fact Sheet for Healthcare Providers: IncredibleEmployment.be  This test is not yet approved or cleared by the  [place] FDA and has been authorized for detection and/or diagnosis of SARS-CoV-2 by FDA under an Emergency Use Authorization (EUA). This EUA will remain in effect (meaning this test can be used) for the duration of the COVID-19 declaration under Section 564(b)(1) of the Act, 21 U.S.C. section 360bbb-3(b)(1), unless the authorization is terminated or revoked.  Performed at [facility], [place] [address], [place], [place] [address]   Resp Panel by RT-PCR (Flu A&B, Covid) Nasopharyngeal Swab     Status: None   Collection Time: 07/17/20  5:29 PM   Specimen: Nasopharyngeal Swab; Nasopharyngeal(NP) swabs in vial transport medium  Result Value Ref Range Status   SARS Coronavirus 2 by RT PCR NEGATIVE NEGATIVE Final    Comment: (NOTE) SARS-CoV-2 target nucleic acids are NOT DETECTED.  The SARS-CoV-2 RNA is generally detectable in upper respiratory specimens during the acute phase of infection. The lowest concentration of SARS-CoV-2 viral copies this assay can detect is 138 copies/mL. A negative result does not preclude SARS-Cov-2 infection and should not be used as the sole basis for treatment or other patient management decisions. A negative result may occur with  improper specimen collection/handling, submission of specimen other than nasopharyngeal swab, presence of viral mutation(s) within the areas targeted by this assay, and inadequate number of viral copies(<138 copies/mL). A negative result must be combined with clinical observations, patient history, and epidemiological information. The expected result is Negative.  Fact Sheet for Patients:  EntrepreneurPulse.com.au  Fact Sheet for Healthcare Providers:  IncredibleEmployment.be  This test is no t yet approved or cleared by the [place] FDA and  has been authorized for detection and/or diagnosis of SARS-CoV-2 by FDA under an Emergency Use Authorization (EUA). This EUA will  remain  in effect (meaning this test can be used) for the duration of the COVID-19 declaration under Section 564(b)(1) of the Act, 21 U.S.C.section 360bbb-3(b)(1), unless the authorization is terminated  or revoked sooner.       Influenza A by PCR NEGATIVE NEGATIVE Final   Influenza B by PCR NEGATIVE NEGATIVE Final    Comment: (NOTE) The Xpert Xpress SARS-CoV-2/FLU/RSV plus assay is intended as an aid in the diagnosis of influenza from Nasopharyngeal swab specimens and should not be used as a sole basis for treatment. Nasal washings and aspirates are unacceptable for Xpert Xpress SARS-CoV-2/FLU/RSV testing.  Fact Sheet for Patients: EntrepreneurPulse.com.au  Fact Sheet for Healthcare Providers: IncredibleEmployment.be  This test is not yet approved or cleared by the [place] FDA and has been authorized for detection and/or diagnosis of SARS-CoV-2 by FDA under an Emergency Use Authorization (EUA). This EUA will remain in effect (meaning this test can be used) for the duration of the COVID-19 declaration under Section 564(b)(1) of the Act, 21 U.S.C. section 360bbb-3(b)(1), unless the authorization is terminated or revoked.  Performed at [facility], [place] [address], [place], [place] [address]   Culture, blood (Routine X 2) w Reflex to ID Panel     Status: None (Preliminary result)   Collection Time: 07/18/20 11:16 AM   Specimen: BLOOD  Result Value Ref Range Status   Specimen Description BLOOD LEFT ANTECUBITAL  Final   Special Requests   Final    BOTTLES DRAWN AEROBIC AND ANAEROBIC Blood Culture adequate volume   Culture   Final    NO GROWTH 2 DAYS Performed at [facility], [place] [address], [place], [place] [address]    Report Status PENDING  Incomplete  Culture, blood (Routine X 2) w Reflex to ID Panel     Status: None (Preliminary result)   Collection Time: 07/18/20 11:16 AM   Specimen: BLOOD LEFT HAND  Result Value  Ref Range Status   Specimen Description BLOOD LEFT HAND  Final   Special Requests   Final    BOTTLES DRAWN AEROBIC AND ANAEROBIC Blood Culture adequate volume   Culture   Final    NO GROWTH 2 DAYS Performed at [facility], [place] [address], [place], [place] [address]    Report Status PENDING  Incomplete      Radiology Studies: No results found.  Marzetta Board, MD, PhD [facility]  Between 7 am - 7 pm I am available, please contact me via Amion (for emergencies) or Securechat (non urgent messages)  Between 7 pm - 7 am I am not available, please contact night coverage MD/APP via Amion

## 2020-07-20 NOTE — Progress Notes (Addendum)
[facility] Progress Note   Subjective:     Patient seen and examined at bedside. No complaints. Denies SOB and CP. Tolerated yesterday's HD with net UF 5L. Plan for HD 7/12.   Objective Vitals:   07/19/20 1725 07/19/20 2025 07/20/20 0502 07/20/20 1032  BP: (!) 162/98 (!) 161/93 101/85 (!) 145/90  Pulse: 68 65 70 68  Resp: 17 17 16 18   Temp: 98.4 F (36.9 C) 98.2 F (36.8 C) 98.5 F (36.9 C) 98.4 F (36.9 C)  TempSrc: Oral Oral Oral Oral  SpO2: 92% 93% 92% 94%  Weight:       Physical Exam General: Well-appearing male; NAD Heart: Normal S1 and S2; No murmurs, gallops, or rubs Lungs: Clear anteriorly and laterally; No wheezing, rales, or rhonchi Abdomen: Soft and non-tender Extremities: No edema BLLE Dialysis Access: RUE AVF (+) thrill    Filed Weights   07/19/20 0825 07/19/20 1156  Weight: 82 kg 77 kg    Intake/Output Summary (Last 24 hours) at 07/20/2020 1242 Last data filed at 07/20/2020 1203 Gross per 24 hour  Intake 1200 ml  Output --  Net 1200 ml    Additional Objective Labs: Basic Metabolic Panel: Recent Labs  Lab 07/18/20 0628 07/19/20 0209 07/20/20 1000  NA 135 136 134*  K 3.6 4.5 4.4  CL 94* 95* 96*  CO2 29 29 27   GLUCOSE 94 105* 98  BUN 52* 71* 51*  CREATININE 8.66* 11.21* 9.06*  CALCIUM 8.3* 8.5* 8.7*  PHOS  --   --  8.3*   Liver Function Tests: Recent Labs  Lab 07/17/20 1201 07/20/20 1000  AST 21  --   ALT 23  --   ALKPHOS 35*  --   BILITOT 0.8  --   PROT 6.4*  --   ALBUMIN 3.3* 3.0*   No results for input(s): LIPASE, AMYLASE in the last 168 hours. CBC: Recent Labs  Lab 07/17/20 1201 07/18/20 0628 07/19/20 0209  WBC 4.7 4.2 3.9*  NEUTROABS 3.6  --   --   HGB 9.5* 9.6* 9.8*  HCT 29.0* 28.6* 28.6*  MCV 93.2 90.5 89.7  PLT 201 212 211   Blood Culture    Component Value Date/Time   SDES BLOOD LEFT ANTECUBITAL 07/18/2020 1116   SDES BLOOD LEFT HAND 07/18/2020 1116   SPECREQUEST  07/18/2020 1116     BOTTLES DRAWN AEROBIC AND ANAEROBIC Blood Culture adequate volume   SPECREQUEST  07/18/2020 1116    BOTTLES DRAWN AEROBIC AND ANAEROBIC Blood Culture adequate volume   CULT  07/18/2020 1116    NO GROWTH 2 DAYS Performed at [facility] [address], [place], [place] [address]    CULT  07/18/2020 1116    NO GROWTH 2 DAYS Performed at [facility] [address] [place], [place] [address]    REPTSTATUS PENDING 07/18/2020 1116   REPTSTATUS PENDING 07/18/2020 1116    Cardiac Enzymes: No results for input(s): CKTOTAL, CKMB, CKMBINDEX, TROPONINI in the last 168 hours. CBG: No results for input(s): GLUCAP in the last 168 hours. Iron Studies: No results for input(s): IRON, TIBC, TRANSFERRIN, FERRITIN in the last 72 hours. Lab Results  Component Value Date   INR 1.2 10/30/2019   Studies/Results: No results found.  Medications:   amLODipine  10 mg Oral QPM   carvedilol  25 mg Oral BID WC   Chlorhexidine Gluconate Cloth  6 each Topical Q0600   darbepoetin (ARANESP) injection - DIALYSIS  25 mcg Intravenous  Q Sat-HD   [START ON 07/22/2020] doxercalciferol  9 mcg Intravenous Q T,Th,Sa-HD   hydrALAZINE  100 mg Oral TID   multivitamin  1 tablet Oral QHS   sevelamer carbonate  2,400-4,000 mg Oral TID WC    Dialysis Orders: TTS @ [facility] -> last HD there on 6/30, dialyzed at [facility] 7/3 4:15hr, 200dialyzer, 400/800, EDW 76kg, 2K/2Ca, AVF, heparin 6000 - Hectoral 64mcg IV q HD - Venofer 50/week -Micera 10mcg q 2 weeks-last dose 07/01/20  Assessment/Plan: Back pain: L5-S1 bulging disk, suspicious for diskitis per imaging, but no recent known bacteremic events -> blood cultures from 7/8-no growth X 2 days. Per hospitalist note: plan for CT-guided lumbar disc aspiration via IR-patient now agreeable-schedule to place pt NPO after midnight.  ESRD:  S/p HD overnight last night - back to TTS schedule now-tolerated net UF 5L during yesterday's HD-plan for HD 7/12. Hypertension/volume: BP  originally high-now improved after resuming home meds and pushing UF. Continue to monitor BP trends. Anemia: Hgb 9.8 - Aranesp dose given 7/9. Metabolic bone disease: Ca ok, Phos usually sky high - now 8.3-continue home bidners and VDRA resumed.  Tobie Poet, NP [facility] 07/20/2020,12:42 PM  LOS: 1 day    Seen and examined independently.  Agree with note and exam as documented above by physician extender and as noted here.  Feels ok today.  States does normally take his blood pressure meds at home - hadn't leading up to admission as he couldn't get to them  General adult male in bed in no acute distress HEENT normocephalic atraumatic extraocular movements intact sclera anicteric Neck supple trachea midline Lungs clear to auscultation bilaterally normal work of breathing at rest on room air Heart S1S2 no rub Abdomen soft nontender nondistended Extremities no edema Psych normal mood and affect Neuro - alert and oriented x 3 provides hx and follows commands Access RUE AVF with bruit and thrill    Low back pain - blood cultures NGTD; imaging findings are moderately suspicious for diskitis and early osteo; for procedure with IR on 7/11 per charting   ESRD - HD per TTS schedule    Hyperkalemia  improved with HD   HTN - continue home regimen and optimize volume with HD   Anemia CKD - on ESA   Metabolic bone disease - continue home binders; started back hectorol  Claudia Desanctis, MD 07/20/2020 1:16 PM

## 2020-07-21 ENCOUNTER — Encounter (HOSPITAL_COMMUNITY): Payer: Self-pay | Admitting: Internal Medicine

## 2020-07-21 ENCOUNTER — Inpatient Hospital Stay (HOSPITAL_COMMUNITY): Payer: Medicare HMO

## 2020-07-21 DIAGNOSIS — M4646 Discitis, unspecified, lumbar region: Secondary | ICD-10-CM

## 2020-07-21 HISTORY — PX: IR LUMBAR DISC ASPIRATION W/IMG GUIDE: IMG5306

## 2020-07-21 LAB — PROTIME-INR
INR: 1.2 (ref 0.8–1.2)
Prothrombin Time: 15.1 seconds (ref 11.4–15.2)

## 2020-07-21 IMAGING — XA IR DISC ASPIRATION W/IMAG GUIDE
3 series · 14 of 21 positions shown · non-contrast
Comparison: none

INDICATION: CEOLA. is a 60 y.o. male with past medical history
significant of asthma, ESRD on renal replacement therapy, HTN, CHF,
bilateral pleural effusion, chronic venous insufficiency, who
presented to [facility] ED due to weakness and back pain, currently admitted
due to acute hypoxic respiratory failure in the setting of acute
pulmonary edema due to acute on chronic systolic CHF in setting of
missed dialysis. Has a workup for back pain, he underwent MRI of
lumbar spine without contrast on [DATE] with findings concerning
for possible L5-S1 discitis. He comes to our service today for a
fluoroscopy guided L5-S1 fine-needle aspiration

[Series 2: fl angio · 2 acquisitions, 5 frames shown (1 of 2)]
[im 1/2]
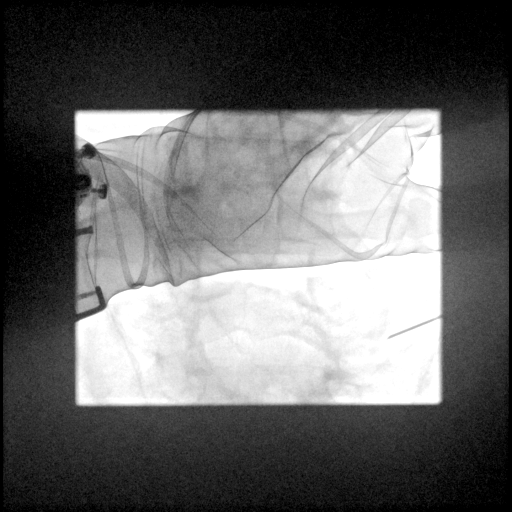
[im 1/2]
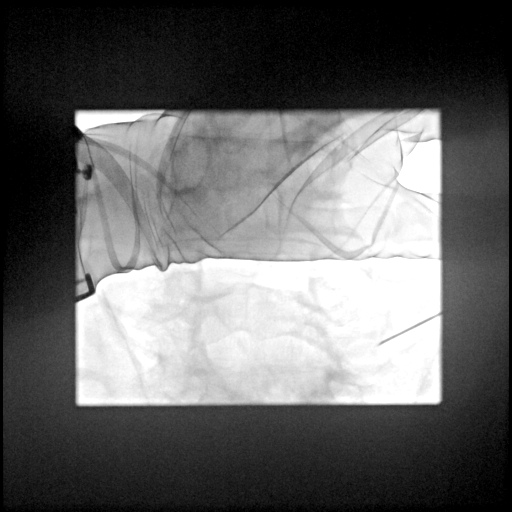
[im 1/2]
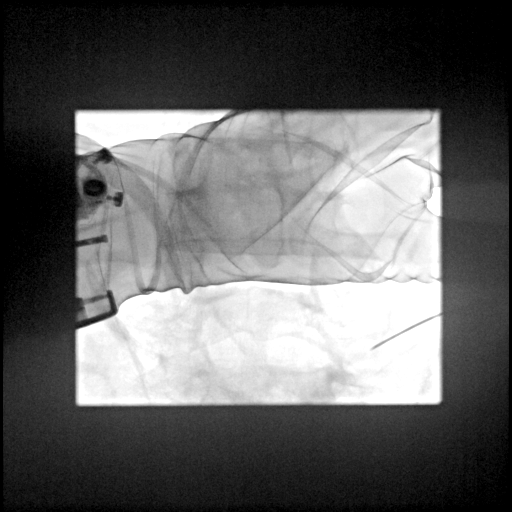
[im 2/2]
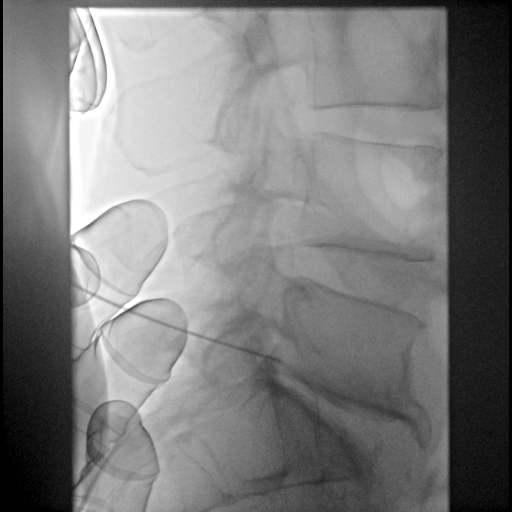
[im 2/2]
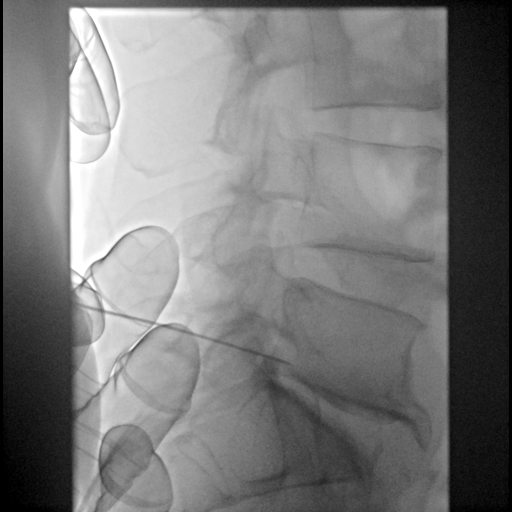

[Series 3: fl angio · 2 acquisitions, 4 frames shown (2 of 2)]
[im 1/2]
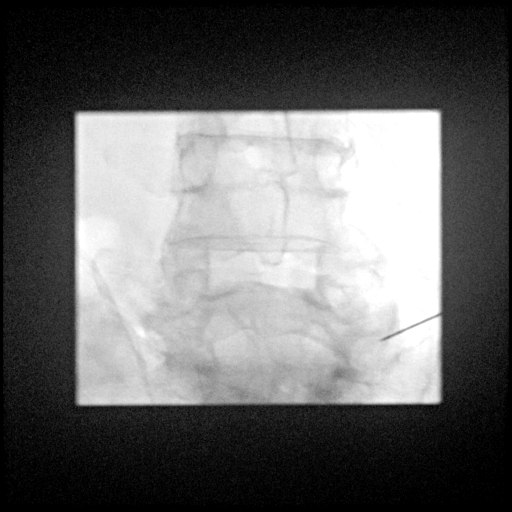
[im 1/2]
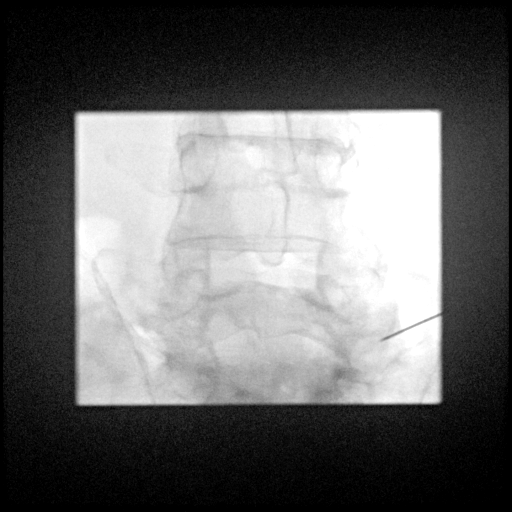
[im 2/2]
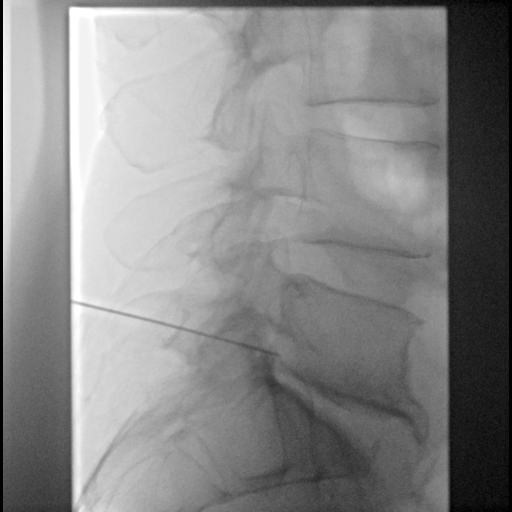
[im 2/2]
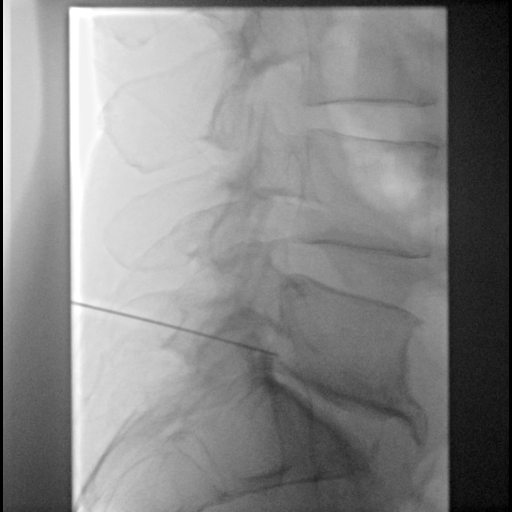

[Series 300: spine · 5 of 8 slices shown]
[im 2/8]
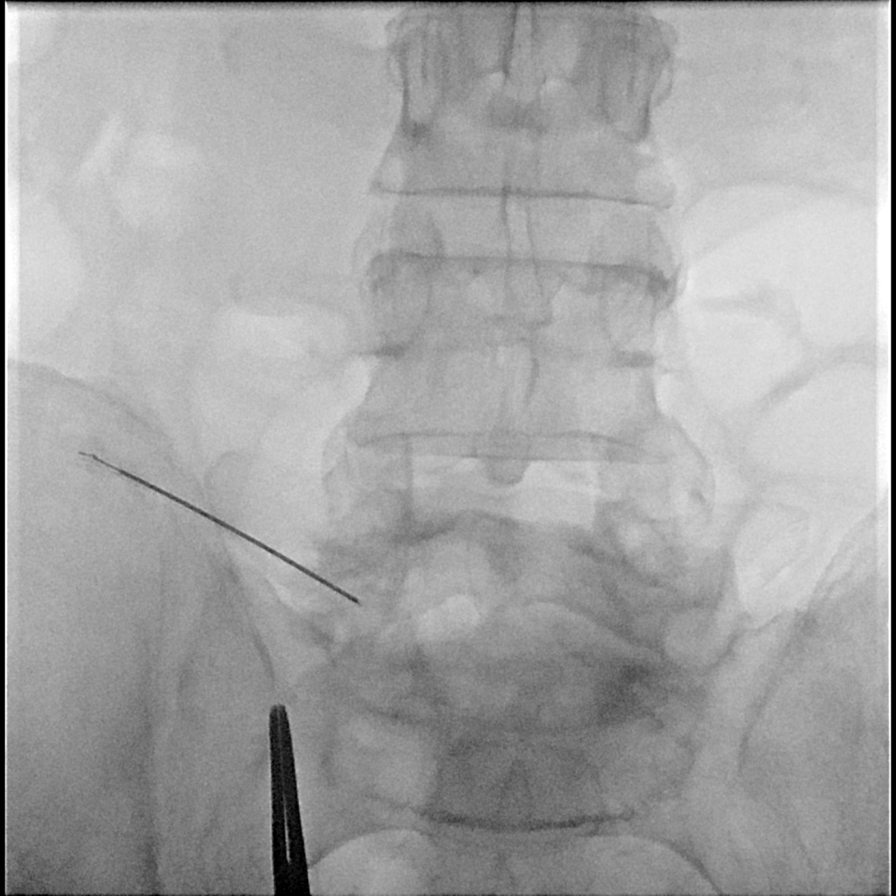
[im 3/8]
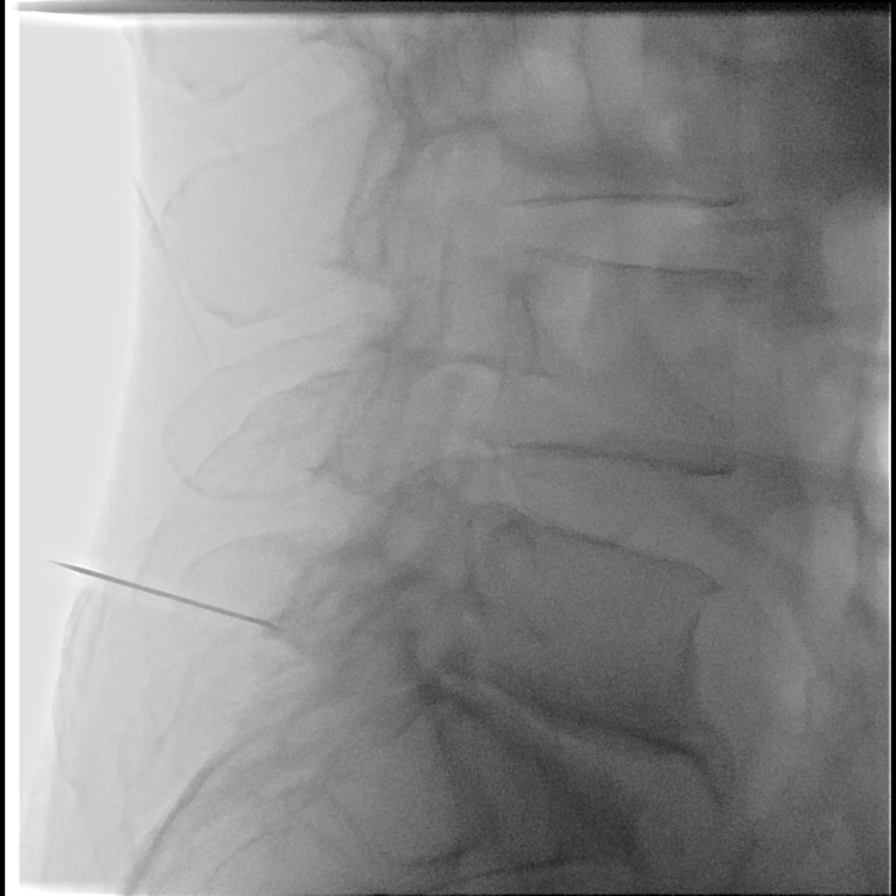
[im 5/8]
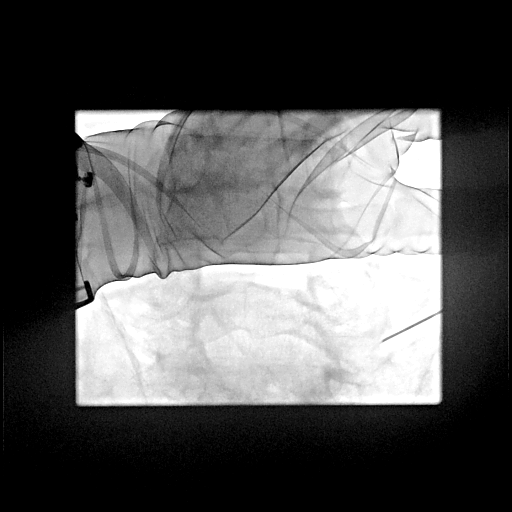
[im 6/8]
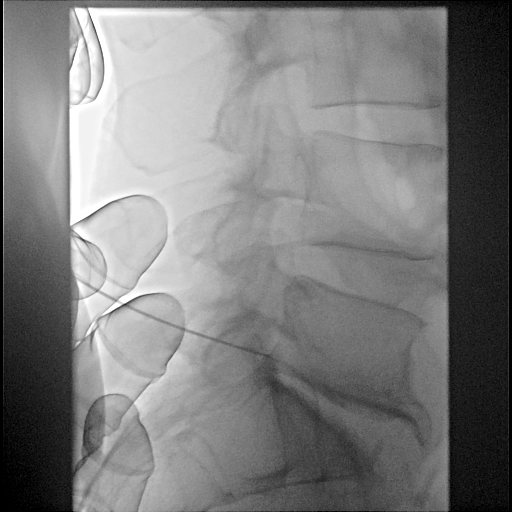
[im 8/8]
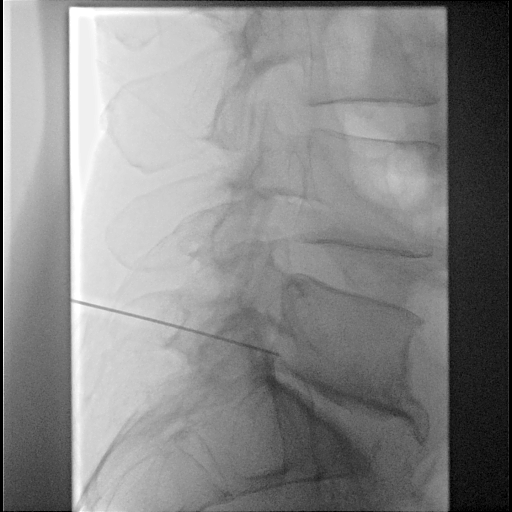

[14 of 21 positions shown; findings below may reference images not displayed]

EXAM:
L5-S1 FLUOROSCOPY GUIDED FINE-NEEDLE ASPIRATION

MEDICATIONS:
The patient is currently admitted to the hospital and receiving
intravenous antibiotics. The antibiotics were administered within an
appropriate time frame prior to the initiation of the procedure.

ANESTHESIA/SEDATION:
Fentanyl 25 mcg IV; Versed 1 mg IV

Moderate Sedation Time:  31

The patient was continuously monitored during the procedure by the
interventional radiology nurse under my direct supervision.

COMPLICATIONS:
None immediate.

PROCEDURE:
Informed written consent was obtained from the patient after a
thorough discussion of the procedural risks, benefits and
alternatives. All questions were addressed.

Maximal Sterile Barrier Technique was utilized including caps, mask,
sterile gowns, sterile gloves, sterile drape, hand hygiene and skin
antiseptic. A timeout was performed prior to the initiation of the
procedure.

The patient was placed in prone position on the angiography table.
The lumbar spine region was prepped and draped in a sterile fashion.

Under fluoroscopy, the L5-S1 disc space was delineated and the skin
area was marked. The skin was infiltrated with a 1% Lidocaine
approximately 7 cm lateral to the spinous process projection on the
right. Using a 22-gauge spinal needle, the paraspinal soft tissues
were infiltrated with Bupivacaine 0.5%. The spinal needle was then
advanced into the L5-S1 disc space. A fine-needle aspiration sample
was then collected and sent to laboratory for Gram stain culture.
The needle was subsequently withdrawn.

The access sites were cleaned and covered with a sterile bandage.
IMPRESSION: Fluoroscopy-guided L5-S1 fine-needle aspiration for Gram stain and
culture.

## 2020-07-21 MED ORDER — BUPIVACAINE HCL (PF) 0.5 % IJ SOLN
INTRAMUSCULAR | Status: AC
  Filled 2020-07-21: qty 30

## 2020-07-21 MED ORDER — FENTANYL CITRATE (PF) 100 MCG/2ML IJ SOLN
INTRAMUSCULAR | Status: AC | PRN
  Administered 2020-07-21 (×2): 25 ug via INTRAVENOUS

## 2020-07-21 MED ORDER — MIDAZOLAM HCL 2 MG/2ML IJ SOLN
INTRAMUSCULAR | Status: AC
  Filled 2020-07-21: qty 2

## 2020-07-21 MED ORDER — LIDOCAINE HCL 1 % IJ SOLN
INTRAMUSCULAR | Status: AC
  Filled 2020-07-21: qty 20

## 2020-07-21 MED ORDER — FENTANYL CITRATE (PF) 100 MCG/2ML IJ SOLN
INTRAMUSCULAR | Status: AC
  Filled 2020-07-21: qty 2

## 2020-07-21 MED ORDER — BUPIVACAINE HCL (PF) 0.5 % IJ SOLN
INTRAMUSCULAR | Status: AC | PRN
  Administered 2020-07-21: 20 mL

## 2020-07-21 MED ORDER — MIDAZOLAM HCL 2 MG/2ML IJ SOLN
INTRAMUSCULAR | Status: AC | PRN
  Administered 2020-07-21 (×2): 0.5 mg via INTRAVENOUS

## 2020-07-21 MED ORDER — LIDOCAINE HCL (PF) 1 % IJ SOLN
INTRAMUSCULAR | Status: AC | PRN
  Administered 2020-07-21: 10 mL via SUBCUTANEOUS

## 2020-07-21 NOTE — Progress Notes (Signed)
[facility] Progress Note   Subjective:     Patient seen and examined at bedside. No complaints. Denies SOB and CP. Tolerated yesterday's HD with net UF 5L. Plan for HD 7/12.   Objective Vitals:   07/21/20 1340 07/21/20 1345 07/21/20 1350 07/21/20 1414  BP: (!) 166/100 (!) 148/97 (!) 170/95 (!) 161/96  Pulse: 72 70 69 67  Resp: 18 17 14 16   Temp:    98.2 F (36.8 C)  TempSrc:    Oral  SpO2: 100% 98% 98% 94%  Weight:       Physical Exam General: Well-appearing male; NAD Heart: Normal S1 and S2; No murmurs, gallops, or rubs Lungs: Clear anteriorly and laterally; No wheezing, rales, or rhonchi Abdomen: Soft and non-tender Extremities: No edema BLLE Dialysis Access: RUE AVF (+) thrill    Filed Weights   07/19/20 0825 07/19/20 1156  Weight: 82 kg 77 kg    Intake/Output Summary (Last 24 hours) at 07/21/2020 1634 Last data filed at 07/20/2020 1700 Gross per 24 hour  Intake 360 ml  Output --  Net 360 ml     Additional Objective Labs: Basic Metabolic Panel: Recent Labs  Lab 07/18/20 0628 07/19/20 0209 07/20/20 1000  NA 135 136 134*  K 3.6 4.5 4.4  CL 94* 95* 96*  CO2 29 29 27   GLUCOSE 94 105* 98  BUN 52* 71* 51*  CREATININE 8.66* 11.21* 9.06*  CALCIUM 8.3* 8.5* 8.7*  PHOS  --   --  8.3*    Liver Function Tests: Recent Labs  Lab 07/17/20 1201 07/20/20 1000  AST 21  --   ALT 23  --   ALKPHOS 35*  --   BILITOT 0.8  --   PROT 6.4*  --   ALBUMIN 3.3* 3.0*    No results for input(s): LIPASE, AMYLASE in the last 168 hours. CBC: Recent Labs  Lab 07/17/20 1201 07/18/20 0628 07/19/20 0209  WBC 4.7 4.2 3.9*  NEUTROABS 3.6  --   --   HGB 9.5* 9.6* 9.8*  HCT 29.0* 28.6* 28.6*  MCV 93.2 90.5 89.7  PLT 201 212 211    Blood Culture    Component Value Date/Time   SDES BLOOD LEFT ANTECUBITAL 07/18/2020 1116   SDES BLOOD LEFT HAND 07/18/2020 1116   SPECREQUEST  07/18/2020 1116    BOTTLES DRAWN AEROBIC AND ANAEROBIC Blood Culture  adequate volume   SPECREQUEST  07/18/2020 1116    BOTTLES DRAWN AEROBIC AND ANAEROBIC Blood Culture adequate volume   CULT  07/18/2020 1116    NO GROWTH 3 DAYS Performed at [facility] [address] [place], [place] [address]    CULT  07/18/2020 1116    NO GROWTH 3 DAYS Performed at [facility] [address] [place], [place] [address]    REPTSTATUS PENDING 07/18/2020 1116   REPTSTATUS PENDING 07/18/2020 1116    Cardiac Enzymes: No results for input(s): CKTOTAL, CKMB, CKMBINDEX, TROPONINI in the last 168 hours. CBG: No results for input(s): GLUCAP in the last 168 hours. Iron Studies: No results for input(s): IRON, TIBC, TRANSFERRIN, FERRITIN in the last 72 hours. Lab Results  Component Value Date   INR 1.2 07/21/2020   INR 1.2 10/30/2019   Studies/Results: IR LUMBAR DISC ASPIRATION W/IMG GUIDE  Result Date: 07/21/2020 INDICATION: Jason [name]. is a 60 y.o. male with past medical history significant of asthma, ESRD on renal replacement therapy, HTN, CHF, bilateral pleural effusion, chronic venous insufficiency, who presented  to [facility] ED due to weakness and back pain, currently admitted due to acute hypoxic respiratory failure in the setting of acute pulmonary edema due to acute on chronic systolic CHF in setting of missed dialysis. Has a workup for back pain, he underwent MRI of lumbar spine without contrast on 07/18/2020 with findings concerning for possible L5-S1 discitis. He comes to our service today for a fluoroscopy guided L5-S1 fine-needle aspiration EXAM: L5-S1 FLUOROSCOPY GUIDED FINE-NEEDLE ASPIRATION MEDICATIONS: The patient is currently admitted to the hospital and receiving intravenous antibiotics. The antibiotics were administered within an appropriate time frame prior to the initiation of the procedure. ANESTHESIA/SEDATION: Fentanyl 25 mcg IV; Versed 1 mg IV Moderate Sedation Time:  31 The patient was continuously monitored during the procedure by the  interventional radiology nurse under my direct supervision. COMPLICATIONS: None immediate. PROCEDURE: Informed written consent was obtained from the patient after a thorough discussion of the procedural risks, benefits and alternatives. All questions were addressed. Maximal Sterile Barrier Technique was utilized including caps, mask, sterile gowns, sterile gloves, sterile drape, hand hygiene and skin antiseptic. A timeout was performed prior to the initiation of the procedure. The patient was placed in prone position on the angiography table. The lumbar spine region was prepped and draped in a sterile fashion. Under fluoroscopy, the L5-S1 disc space was delineated and the skin area was marked. The skin was infiltrated with a 1% Lidocaine approximately 7 cm lateral to the spinous process projection on the right. Using a 22-gauge spinal needle, the paraspinal soft tissues were infiltrated with Bupivacaine 0.5%. The spinal needle was then advanced into the L5-S1 disc space. A fine-needle aspiration sample was then collected and sent to laboratory for Gram stain culture. The needle was subsequently withdrawn. The access sites were cleaned and covered with a sterile bandage. IMPRESSION: Fluoroscopy-guided L5-S1 fine-needle aspiration for Gram stain and culture. Electronically Signed   By: Pedro Earls M.D.   On: 07/21/2020 14:41    Medications:   amLODipine  10 mg Oral QPM   bupivacaine       carvedilol  25 mg Oral BID WC   Chlorhexidine Gluconate Cloth  6 each Topical Q0600   darbepoetin (ARANESP) injection - DIALYSIS  25 mcg Intravenous Q Sat-HD   [START ON 07/22/2020] doxercalciferol  9 mcg Intravenous Q T,Th,Sa-HD   hydrALAZINE  100 mg Oral TID   lidocaine       multivitamin  1 tablet Oral QHS   sevelamer carbonate  2,400-4,000 mg Oral TID WC    Dialysis Orders: TTS @ [facility] -> last HD there on 6/30, dialyzed at [facility] 7/3 4:15hr, 200dialyzer, 400/800, EDW 76kg, 2K/2Ca, AVF, heparin  6000 - Hectoral 71mcg IV q HD - Venofer 50/week -Micera 5mcg q 2 weeks-last dose 07/01/20  Assessment/Plan: Back pain: L5-S1 bulging disk, suspicious for diskitis per imaging, but no recent known bacteremic events -> blood cultures from 7/8-no growth X 2 days. Per hospitalist note: plan for CT-guided lumbar disc aspiration via IR today.   ESRD:  continue TTS schedule. Next HD tomorrow. Hypertension/volume: BP originally high-now improved after resuming home meds and pushing UF. Continue to monitor BP trends. Anemia: Hgb 9.8 - Aranesp dose given 7/9. Metabolic bone disease: Ca ok, Phos usually sky high - now 8.3-continue home bidners and VDRA resumed.    [name], MD 07/21/2020 4:34 PM

## 2020-07-21 NOTE — H&P (Signed)
Chief Complaint: Patient was seen in consultation today for image guided disc aspiration  Chief Complaint  Patient presents with   Back Pain   at the request of Dr. Cruzita Lederer, C.  Referring Physician(s): Dr. Cruzita Lederer, C.  Supervising Physician: Pedro Earls  Patient Status: [facility] - In-pt  History of Present Illness: Jason [name]. is a 60 y.o. male with past medical history significant of asthma, ESRD on renal replacement therapy, HTN, CHF, bilateral pleural effusion, chronic venous insufficiency, who presented to [facility] ED due to weakness and back pain, currently admitted due to acute hypoxic respiratory failure in the setting of acute pulmonary edema due to acute on chronic systolic CHF in setting of missed dialysis. Patient underwent MRI of lumbar spine without contrast on 07/18/2020 which showed:  1. Nonspecific, but suspicious, disc and endplate changes at E5-U3 as described. Findings could relate to dialysis-related spondyloarthropathy, although the paraspinal inflammatory changes are moderately suspicious for diskitis and early osteomyelitis. Consider disc space aspiration and/or short-term MR follow-up without and with contrast. 2. No focal fluid collection or epidural inflammation identified. 3. Moderate foraminal narrowing bilaterally at L5-S1. 4. No other significant disc space findings.  Patient remained afebrile and had normal WBC count during this admission, IR was requested for disc aspiration on 07/18/2020.case was reviewed and approved by Dr. Karenann Cai, however, patient initially declined the procedure.  Patient now agreeable to proceed with L5-S1 disc aspiration.  Patient laying in bed, not in acute distress, he is RN at the bedside. Reports mild nonradiating back pain 2 out of 10 pain scale, worse with movement. Denise headache, fever, chills, shortness of breath, cough, chest pain, abdominal pain, nausea ,vomiting, and bleeding.   Past  Medical History:  Diagnosis Date   A-V fistula ([place])    Right arm   Abdominal hernia    AKI (acute kidney injury) ([place]) 01/2015   Anasarca    Anemia    Asthma    as a child   Bilateral inguinal hernia    Bilateral pleural effusion 01/2017   Cellulitis 01/16/2017   Bilateral lower extremity   CHF (congestive heart failure) ([facility])    Chronic venous insufficiency    Concentric left ventricular hypertrophy 08/17/2017   Moderated, noted on ECHO   Diastolic dysfunction 14/97/0263   noted on ECHO   Elevated LFTs    per patient, resolved   End stage renal disease ([place])    Dialysis T/Th/Sa  Started 02/2017/pt is waiting for a kidney transplant   H/O right inguinal hernia repair 11/2017   History of colonoscopy 03/20/2018   History of elevated PSA 10/2017   Hypertension    Pneumonia    Pulmonary hypertension ([place])    Moderate   Venous stasis ulcer ([place])    bil legs/ healed up now per pt (03/06/18)    Past Surgical History:  Procedure Laterality Date   AV FISTULA PLACEMENT Right 01/20/2017   Procedure: ARTERIOVENOUS (AV) FISTULA CREATION;  Surgeon: Angelia Mould, MD;  Location: [facility];  Service: Vascular;  Laterality: Right;   HERNIA REPAIR     INSERTION OF DIALYSIS CATHETER Right 01/20/2017   Procedure: INSERTION OF DIALYSIS CATHETER;  Surgeon: Angelia Mould, MD;  Location: [facility];  Service: Vascular;  Laterality: Right;   INSERTION OF MESH N/A 11/18/2017   Procedure: INSERTION OF MESH;  Surgeon: Michael Boston, MD;  Location: [facility];  Service: General;  Laterality: N/A;   IR FLUORO GUIDE CV LINE RIGHT  01/18/2017   IR US GUIDE VASC ACCESS RIGHT  01/18/2017   LAPAROSCOPIC INGUINAL HERNIA WITH UMBILICAL HERNIA Bilateral 11/18/2017   Procedure: LAPAROSCOPIC BILATERAL INGUINAL HERNIA REPAIR WITH UMBILICAL HERNIA REPAIR WITH MESH;  Surgeon: Michael Boston, MD;  Location: [facility];  Service: General;  Laterality: Bilateral;   TOE SURGERY     right fracture in big toe    TONSILLECTOMY      Allergies: Lisinopril  Medications: Prior to Admission medications   Medication Sig Start Date End Date Taking? Authorizing Provider  acetaminophen (TYLENOL) 500 MG tablet Take 1,000 mg by mouth every 6 (six) hours as needed for moderate pain or headache.   Yes [provider]  amLODipine (NORVASC) 10 MG tablet Take 10 mg by mouth every evening. 06/20/18  Yes [provider]  carvedilol (COREG) 25 MG tablet Take 1 tablet (25 mg total) by mouth 2 (two) times daily with a meal. 02/02/18  Yes [name], FNP  doxercalciferol (HECTOROL) 0.5 MCG capsule Doxercalciferol (Hectorol) 02/21/20 02/19/21 Yes [provider]  hydrALAZINE (APRESOLINE) 100 MG tablet Take 1 tablet (100 mg total) by mouth 3 (three) times daily. 11/03/19 07/17/20 Yes Pahwani, Einar Grad, MD  iron sucrose in sodium chloride 0.9 % 100 mL Iron Sucrose (Venofer) 02/26/20 02/17/21 Yes [provider]  Methoxy PEG-Epoetin Beta (MIRCERA IJ) Mircera 02/12/20 02/10/21 Yes [provider]  multivitamin (RENA-VIT) TABS tablet Take 1 tablet by mouth daily. 10/20/17  Yes [provider]  polyvinyl alcohol (LIQUIFILM TEARS) 1.4 % ophthalmic solution Place 1 drop into both eyes as needed for dry eyes.   Yes [provider]  sevelamer carbonate (RENVELA) 800 MG tablet Take 2,400-4,000 mg by mouth 3 (three) times daily. 01/15/20  Yes [provider]  HYDROcodone-acetaminophen (NORCO/VICODIN) 5-325 MG tablet Take 1 tablet by mouth every 6 (six) hours as needed for severe pain. Patient not taking: No sig reported 07/13/20   Truddie Hidden, MD  methocarbamol (ROBAXIN) 500 MG tablet Take 1 tablet (500 mg total) by mouth 2 (two) times daily. Patient not taking: No sig reported 07/13/20   Truddie Hidden, MD     Family History  Problem Relation Age of Onset   Heart attack Mother 49       CABG hx   Heart disease Mother    Hypertension Mother    Stroke Mother     Diabetes Father    Healthy Brother    Prostate cancer Maternal Grandfather    Colon cancer Neg Hx    Colon polyps Neg Hx    Esophageal cancer Neg Hx    Stomach cancer Neg Hx    Rectal cancer Neg Hx     Social History   Socioeconomic History   Marital status: Single    Spouse name: Not on file   Number of children: Not on file   Years of education: Not on file   Highest education level: Not on file  Occupational History   Not on file  Tobacco Use   Smoking status: Former    Years: 17.00    Pack years: 0.00    Types: Cigarettes    Quit date: 63    Years since quitting: 25.5   Smokeless tobacco: Never   Tobacco comments:    quit smoking in 1997  Vaping Use   Vaping Use: Never used  Substance and Sexual Activity   Alcohol use: Not Currently    Comment: every several months, rare   Drug use: Not  Currently    Comment: marijuana   Sexual activity: Not Currently  Other Topics Concern   Not on file  Social History Narrative   Not on file   Social Determinants of Health   Financial Resource Strain: Not on file  Food Insecurity: Not on file  Transportation Needs: Not on file  Physical Activity: Not on file  Stress: Not on file  Social Connections: Not on file     Review of Systems: A 12 point ROS discussed and pertinent positives are indicated in the HPI above.  All other systems are negative.   Vital Signs: BP (!) 162/95   Pulse 66   Temp 98.8 F (37.1 C) (Oral)   Resp 18   Wt 169 lb 12.1 oz (77 kg)   SpO2 97%   BMI 21.80 kg/m   Physical Exam  Vitals and nursing note reviewed.  Constitutional:      General: He is not in acute distress.    Appearance: Normal appearance.  HENT:     Head: Normocephalic and atraumatic.     Mouth/Throat:     Mouth: Mucous membranes are moist.     Pharynx: Oropharynx is clear.  Cardiovascular:     Rate and Rhythm: Normal rate and regular rhythm.     Pulses: Normal pulses.     Heart sounds: Normal heart sounds.   Pulmonary:     Effort: Pulmonary effort is normal.     Breath sounds: Normal breath sounds. No wheezing, rhonchi or rales.  Abdominal:     General: Bowel sounds are normal. There is no distension.     Palpations: Abdomen is soft.  Skin:    General: Skin is warm and dry.  Neurological:     Mental Status: He is alert and oriented to person, place, and time.  Psychiatric:        Mood and Affect: Mood normal.        Behavior: Behavior normal.    MD Evaluation Airway: WNL Heart: WNL Abdomen: WNL Chest/ Lungs: WNL ASA  Classification: 2 Mallampati/Airway Score: Two  Imaging: DG Chest 2 View  Result Date: 07/17/2020 CLINICAL DATA:  60 year old male with shortness of breath and cough for 1 week. EXAM: CHEST - 2 VIEW COMPARISON:  July 13, 2020. FINDINGS: Image mildly rotated to the LEFT. Accounting for this cardiomediastinal contours and hilar structures are stable. No lobar consolidation or sign of pleural effusion. Signs of vascular crowding/pulmonary vascular congestion. No visible pneumothorax. No effusion. On limited assessment no acute skeletal process. IMPRESSION: Cardiomegaly with pulmonary vascular congestion as before. Electronically Signed   By: Zetta Bills M.D.   On: 07/17/2020 12:54   DG Lumbar Spine Complete  Result Date: 07/17/2020 CLINICAL DATA:  Back pain EXAM: LUMBAR SPINE - COMPLETE 4+ VIEW COMPARISON:  None. FINDINGS: Straightening of the lumbar spine. Vertebral body heights are maintained. Moderate disc space narrowing and degenerative change at L5-S1. Facet degenerative changes of the lower lumbar spine. IMPRESSION: Straightening of the lumbar spine with moderate to marked degenerative change at L5-S1 Electronically Signed   By: Donavan Foil M.D.   On: 07/17/2020 21:48   MR LUMBAR SPINE WO CONTRAST  Result Date: 07/18/2020 CLINICAL DATA:  Weakness and back pain. History end-stage renal disease and anemia. Infection suspected. EXAM: MRI LUMBAR SPINE WITHOUT CONTRAST  TECHNIQUE: Multiplanar, multisequence MR imaging of the lumbar spine was performed. No intravenous contrast was administered. COMPARISON:  Lumbar spine radiographs 07/17/2020. FINDINGS: Segmentation: Conventional anatomy assumed, with the last open  disc space designated L5-S1. Alignment:  Straightening without focal angulation or listhesis. Vertebrae: As demonstrated radiographically, there is advanced disc space narrowing at L5-S1 with endplate osteophytes. There is prominent T2 hyperintensity within the L5-S1 disc, and there are associated endplate erosions and edema. No other suspicious disc space findings. The visualized sacroiliac joints appear unremarkable. Conus medullaris: Extends to the T12-L1 level and appears normal. Paraspinal and other soft tissues: Mild paraspinal inflammatory changes anteriorly at L5-S1 without focal fluid collection. Disc levels: L1-2: Disc height and hydration are maintained. Mild anterior osteophyte formation. No spinal stenosis or nerve root encroachment. L2-3: Mild disc bulging and anterior osteophyte formation. Mild facet hypertrophy. No spinal stenosis or nerve root encroachment. L3-4: The disc appears normal. Mild facet hypertrophy. No spinal stenosis or nerve root encroachment. L4-5: The disc appears normal. Mild bilateral facet hypertrophy. No spinal stenosis or nerve root encroachment. L5-S1: As above, chronic degenerative disc disease with loss of disc height, annular disc bulging and endplate osteophytes. Moderate facet and ligamentous hypertrophy. Moderate foraminal narrowing is present bilaterally. There is no epidural fluid collection. IMPRESSION: 1. Nonspecific, but suspicious, disc and endplate changes at Q6-S3 as described. Findings could relate to dialysis-related spondyloarthropathy, although the paraspinal inflammatory changes are moderately suspicious for diskitis and early osteomyelitis. Consider disc space aspiration and/or short-term MR follow-up without and  with contrast. 2. No focal fluid collection or epidural inflammation identified. 3. Moderate foraminal narrowing bilaterally at L5-S1. 4. No other significant disc space findings. Electronically Signed   By: Richardean Sale M.D.   On: 07/18/2020 10:05   DG Chest Port 1 View  Result Date: 07/13/2020 CLINICAL DATA:  Shortness of breath.  Hypoxia. EXAM: PORTABLE CHEST 1 VIEW COMPARISON:  10/30/2019 FINDINGS: The heart is enlarged and stable in configuration. Lungs are free of focal consolidations and pleural effusions. No pulmonary edema. IMPRESSION: Stable cardiomegaly. Electronically Signed   By: Nolon Nations M.D.   On: 07/13/2020 11:53    Labs:  CBC: Recent Labs    07/13/20 1153 07/17/20 1201 07/18/20 0628 07/19/20 0209  WBC 6.6 4.7 4.2 3.9*  HGB 10.3* 9.5* 9.6* 9.8*  HCT 31.6* 29.0* 28.6* 28.6*  PLT 200 201 212 211    COAGS: Recent Labs    10/30/19 1714 07/21/20 0719  INR 1.2 1.2  APTT 34  --     BMP: Recent Labs    07/17/20 2329 07/18/20 0628 07/19/20 0209 07/20/20 1000  NA 136 135 136 134*  K 5.2* 3.6 4.5 4.4  CL 96* 94* 95* 96*  CO2 20* 29 29 27   GLUCOSE 132* 94 105* 98  BUN 134* 52* 71* 51*  CALCIUM 8.3* 8.3* 8.5* 8.7*  CREATININE 17.14* 8.66* 11.21* 9.06*  GFRNONAA 3* 7* 5* 6*    LIVER FUNCTION TESTS: Recent Labs    10/30/19 1714 10/31/19 0230 11/03/19 1445 07/13/20 0935 07/13/20 1153 07/17/20 1201 07/20/20 1000  BILITOT 1.5* 1.0  --  0.7  --  0.8  --   AST 16 18  --  19  --  21  --   ALT 17 15  --  21  --  23  --   ALKPHOS 32* 30*  --  34*  --  35*  --   PROT 7.5 6.8  --  6.3*  --  6.4*  --   ALBUMIN 4.2 3.7   < > 3.5 3.5 3.3* 3.0*   < > = values in this interval not displayed.    TUMOR MARKERS: No results  for input(s): AFPTM, CEA, CA199, CHROMGRNA in the last 8760 hours.  Assessment and Plan: 60 y.o. male with chronic low back pain, MRI finding moderately suspicious for discitis and early osteomyelitis.  IR was requested for image  guided L5-S1 disc aspiration for further evaluation and management of the chronic low back pain. Case was reviewed and approved by Dr. Karenann Cai. N.p.o. since midnight VS hypertensive at baseline, 163/95 INR 1.2 WBC 3.9, Hgb 9.8-anemia of chronic disease at baseline, PLT 211 Subcu heparin last given on 7/10 5 am.   Risks and benefits of disc aspiration was discussed with the patient and/or patient's family including, but not limited to bleeding, infection, damage to adjacent structures or low yield requiring additional tests.  All of the questions were answered and there is agreement to proceed.  Consent signed and in chart.  Thank you for this interesting consult.  I greatly enjoyed meeting Jason [name]. and look forward to participating in their care.  A copy of this report was sent to the requesting provider on this date.  Electronically Signed: Tera Mater, PA-C 07/21/2020, 9:01 AM   I spent a total of 20 Minutes    in face to face in clinical consultation, greater than 50% of which was counseling/coordinating care for L5-S1 disc aspiration

## 2020-07-21 NOTE — Plan of Care (Signed)
  Problem: Education: Goal: Knowledge of disease and its progression will improve Outcome: Progressing   Problem: Health Behavior/Discharge Planning: Goal: Ability to manage health-related needs will improve Outcome: Progressing   

## 2020-07-21 NOTE — Progress Notes (Signed)
PROGRESS NOTE  Jason [name]. QMV:[id number] DOB: 27-Oct-1960 DOA: 07/17/2020 PCP: [name], FNP (Inactive)   LOS: 2 days   Brief Narrative / Interim history: 60 year old male with history of ESRD on TTS dialysis, HTN, moderate PAH, chronic diastolic CHF comes into the hospital with generalized weakness, low back pain as well as shortness of breath.  Patient states that he experienced severe back pain about a week ago, somewhat out of the blue as he does not recall any trauma or doing heavy work.  Pain was so severe that he was unable to get out of bed, move around, and unable to make it to his dialysis sessions  Subjective / 24h Interval events: Doing well, awaiting CT guided aspiration  Assessment & Plan: Principal Problem Acute hypoxic respiratory failure in the setting of acute pulmonary edema due to acute on chronic diastolic CHF in the setting of missed dialysis-nephrology consulted, continue dialysis -Hypoxia resolved and currently he is on room air  Active Problems ESRD, hyperkalemia-dialysis per nephrology Suspect discitis/osteomyelitis-MRI done on admission without contrast showed an area at the L5/S1 suspect for discitis/osteomyelitis.  Patient without any fever or chills in the last several weeks, not an IV drug user. -Consulted ID, I sent blood cultures and continue to closely monitor off antibiotics.  Cultures negative so far -IR consulted and he is s/p lumbar disk aspiration 7/11. D/w ID, will await gram stain, if negative can potentially go home tomorrow with outpatient follow up. Essential hypertension-continue home regimen Anemia in the setting of end-stage renal disease-hemoglobin stable, monitor  Scheduled Meds:  amLODipine  10 mg Oral QPM   bupivacaine       carvedilol  25 mg Oral BID WC   Chlorhexidine Gluconate Cloth  6 each Topical Q0600   darbepoetin (ARANESP) injection - DIALYSIS  25 mcg Intravenous Q Sat-HD   [START ON 07/22/2020] doxercalciferol  9  mcg Intravenous Q T,Th,Sa-HD   hydrALAZINE  100 mg Oral TID   lidocaine       multivitamin  1 tablet Oral QHS   sevelamer carbonate  2,400-4,000 mg Oral TID WC   Continuous Infusions: PRN Meds:.acetaminophen **OR** acetaminophen, hydrALAZINE, HYDROcodone-acetaminophen, methocarbamol, ondansetron **OR** ondansetron (ZOFRAN) IV, polyvinyl alcohol  Diet Orders (From admission, onward)     Start     Ordered   07/21/20 1408  Diet renal with fluid restriction Fluid restriction: 1200 mL Fluid; Room service appropriate? Yes; Fluid consistency: Thin  Diet effective now       Question Answer Comment  Fluid restriction: 1200 mL Fluid   Room service appropriate? Yes   Fluid consistency: Thin      07/21/20 1408            DVT prophylaxis:      Code Status: Full Code  Family Communication: No family at bedside  Status is: Observation  The patient will require care spanning > 2 midnights and should be moved to inpatient because: Ongoing diagnostic testing needed not appropriate for outpatient work up and Inpatient level of care appropriate due to severity of illness  Dispo: The patient is from: Home              Anticipated d/c is to: Home              Patient currently is not medically stable to d/c.   Difficult to place patient No   Level of care: Telemetry Medical  Consultants:  ID IR Nephrology   Procedures:  None  Microbiology  Blood cultures 7/8   Antimicrobials: none    Objective: Vitals:   07/21/20 1340 07/21/20 1345 07/21/20 1350 07/21/20 1414  BP: (!) 166/100 (!) 148/97 (!) 170/95 (!) 161/96  Pulse: 72 70 69 67  Resp: 18 17 14 16   Temp:    98.2 F (36.8 C)  TempSrc:    Oral  SpO2: 100% 98% 98% 94%  Weight:        Intake/Output Summary (Last 24 hours) at 07/21/2020 1450 Last data filed at 07/20/2020 1700 Gross per 24 hour  Intake 360 ml  Output --  Net 360 ml    Filed Weights   07/19/20 0825 07/19/20 1156  Weight: 82 kg 77 kg     Examination:  Constitutional: NAD in bed Eyes: no icterus ENMT: mmm Neck: normal, supple Respiratory: cta Cardiovascular:rrr, no mrg Abdomen: nd, bs+ Musculoskeletal: no clubbing / cyanosis.  Skin: no rashes Neurologic: non focal   Data Reviewed: I have independently reviewed following labs and imaging studies   CBC: Recent Labs  Lab 07/17/20 1201 07/18/20 0628 07/19/20 0209  WBC 4.7 4.2 3.9*  NEUTROABS 3.6  --   --   HGB 9.5* 9.6* 9.8*  HCT 29.0* 28.6* 28.6*  MCV 93.2 90.5 89.7  PLT 201 212 465    Basic Metabolic Panel: Recent Labs  Lab 07/17/20 1201 07/17/20 2329 07/18/20 0628 07/19/20 0209 07/20/20 1000  NA 136 136 135 136 134*  K 6.1* 5.2* 3.6 4.5 4.4  CL 97* 96* 94* 95* 96*  CO2 23 20* 29 29 27   GLUCOSE 114* 132* 94 105* 98  BUN 126* 134* 52* 71* 51*  CREATININE 17.08* 17.14* 8.66* 11.21* 9.06*  CALCIUM 8.4* 8.3* 8.3* 8.5* 8.7*  PHOS  --   --   --   --  8.3*    Liver Function Tests: Recent Labs  Lab 07/17/20 1201 07/20/20 1000  AST 21  --   ALT 23  --   ALKPHOS 35*  --   BILITOT 0.8  --   PROT 6.4*  --   ALBUMIN 3.3* 3.0*    Coagulation Profile: Recent Labs  Lab 07/21/20 0719  INR 1.2   HbA1C: No results for input(s): HGBA1C in the last 72 hours. CBG: No results for input(s): GLUCAP in the last 168 hours.  Recent Results (from the past 240 hour(s))  Resp Panel by RT-PCR (Flu A&B, Covid) Nasopharyngeal Swab     Status: None   Collection Time: 07/13/20 12:30 PM   Specimen: Nasopharyngeal Swab; Nasopharyngeal(NP) swabs in vial transport medium  Result Value Ref Range Status   SARS Coronavirus 2 by RT PCR NEGATIVE NEGATIVE Final    Comment: (NOTE) SARS-CoV-2 target nucleic acids are NOT DETECTED.  The SARS-CoV-2 RNA is generally detectable in upper respiratory specimens during the acute phase of infection. The lowest concentration of SARS-CoV-2 viral copies this assay can detect is 138 copies/mL. A negative result does not  preclude SARS-Cov-2 infection and should not be used as the sole basis for treatment or other patient management decisions. A negative result may occur with  improper specimen collection/handling, submission of specimen other than nasopharyngeal swab, presence of viral mutation(s) within the areas targeted by this assay, and inadequate number of viral copies(<138 copies/mL). A negative result must be combined with clinical observations, patient history, and epidemiological information. The expected result is Negative.  Fact Sheet for Patients:  EntrepreneurPulse.com.au  Fact Sheet for Healthcare Providers:  IncredibleEmployment.be  This test is no t yet  approved or cleared by the [place] and  has been authorized for detection and/or diagnosis of SARS-CoV-2 by FDA under an Emergency Use Authorization (EUA). This EUA will remain  in effect (meaning this test can be used) for the duration of the COVID-19 declaration under Section 564(b)(1) of the Act, 21 U.S.C.section 360bbb-3(b)(1), unless the authorization is terminated  or revoked sooner.       Influenza A by PCR NEGATIVE NEGATIVE Final   Influenza B by PCR NEGATIVE NEGATIVE Final    Comment: (NOTE) The Xpert Xpress SARS-CoV-2/FLU/RSV plus assay is intended as an aid in the diagnosis of influenza from Nasopharyngeal swab specimens and should not be used as a sole basis for treatment. Nasal washings and aspirates are unacceptable for Xpert Xpress SARS-CoV-2/FLU/RSV testing.  Fact Sheet for Patients: EntrepreneurPulse.com.au  Fact Sheet for Healthcare Providers: IncredibleEmployment.be  This test is not yet approved or cleared by the [place] FDA and has been authorized for detection and/or diagnosis of SARS-CoV-2 by FDA under an Emergency Use Authorization (EUA). This EUA will remain in effect (meaning this test can be used) for the  duration of the COVID-19 declaration under Section 564(b)(1) of the Act, 21 U.S.C. section 360bbb-3(b)(1), unless the authorization is terminated or revoked.  Performed at [facility], [address] [place], [place] [address]   Resp Panel by RT-PCR (Flu A&B, Covid) Nasopharyngeal Swab     Status: None   Collection Time: 07/17/20  5:29 PM   Specimen: Nasopharyngeal Swab; Nasopharyngeal(NP) swabs in vial transport medium  Result Value Ref Range Status   SARS Coronavirus 2 by RT PCR NEGATIVE NEGATIVE Final    Comment: (NOTE) SARS-CoV-2 target nucleic acids are NOT DETECTED.  The SARS-CoV-2 RNA is generally detectable in upper respiratory specimens during the acute phase of infection. The lowest concentration of SARS-CoV-2 viral copies this assay can detect is 138 copies/mL. A negative result does not preclude SARS-Cov-2 infection and should not be used as the sole basis for treatment or other patient management decisions. A negative result may occur with  improper specimen collection/handling, submission of specimen other than nasopharyngeal swab, presence of viral mutation(s) within the areas targeted by this assay, and inadequate number of viral copies(<138 copies/mL). A negative result must be combined with clinical observations, patient history, and epidemiological information. The expected result is Negative.  Fact Sheet for Patients:  EntrepreneurPulse.com.au  Fact Sheet for Healthcare Providers:  IncredibleEmployment.be  This test is no t yet approved or cleared by the [place] FDA and  has been authorized for detection and/or diagnosis of SARS-CoV-2 by FDA under an Emergency Use Authorization (EUA). This EUA will remain  in effect (meaning this test can be used) for the duration of the COVID-19 declaration under Section 564(b)(1) of the Act, 21 U.S.C.section 360bbb-3(b)(1), unless the authorization is terminated  or  revoked sooner.       Influenza A by PCR NEGATIVE NEGATIVE Final   Influenza B by PCR NEGATIVE NEGATIVE Final    Comment: (NOTE) The Xpert Xpress SARS-CoV-2/FLU/RSV plus assay is intended as an aid in the diagnosis of influenza from Nasopharyngeal swab specimens and should not be used as a sole basis for treatment. Nasal washings and aspirates are unacceptable for Xpert Xpress SARS-CoV-2/FLU/RSV testing.  Fact Sheet for Patients: EntrepreneurPulse.com.au  Fact Sheet for Healthcare Providers: IncredibleEmployment.be  This test is not yet approved or cleared by the [place] FDA and has been authorized for detection and/or diagnosis of SARS-CoV-2 by FDA  under an Emergency Use Authorization (EUA). This EUA will remain in effect (meaning this test can be used) for the duration of the COVID-19 declaration under Section 564(b)(1) of the Act, 21 U.S.C. section 360bbb-3(b)(1), unless the authorization is terminated or revoked.  Performed at [facility], [place] [address] [place], [place] [address]   Culture, blood (Routine X 2) w Reflex to ID Panel     Status: None (Preliminary result)   Collection Time: 07/18/20 11:16 AM   Specimen: BLOOD  Result Value Ref Range Status   Specimen Description BLOOD LEFT ANTECUBITAL  Final   Special Requests   Final    BOTTLES DRAWN AEROBIC AND ANAEROBIC Blood Culture adequate volume   Culture   Final    NO GROWTH 3 DAYS Performed at [facility], [place] [address], [place], [place] [address]    Report Status PENDING  Incomplete  Culture, blood (Routine X 2) w Reflex to ID Panel     Status: None (Preliminary result)   Collection Time: 07/18/20 11:16 AM   Specimen: BLOOD LEFT HAND  Result Value Ref Range Status   Specimen Description BLOOD LEFT HAND  Final   Special Requests   Final    BOTTLES DRAWN AEROBIC AND ANAEROBIC Blood Culture adequate volume   Culture   Final    NO GROWTH 3  DAYS Performed at [facility], [place] [address] [place], [place] [address]    Report Status PENDING  Incomplete      Radiology Studies: IR LUMBAR [place] ASPIRATION W/IMG GUIDE  Result Date: 07/21/2020 INDICATION: Jason Diem. is a 60 y.o. male with past medical history significant of asthma, ESRD on renal replacement therapy, HTN, CHF, bilateral pleural effusion, chronic venous insufficiency, who presented to [facility] ED due to weakness and back pain, currently admitted due to acute hypoxic respiratory failure in the setting of acute pulmonary edema due to acute on chronic systolic CHF in setting of missed dialysis. Has a workup for back pain, he underwent MRI of lumbar spine without contrast on 07/18/2020 with findings concerning for possible L5-S1 discitis. He comes to our service today for a fluoroscopy guided L5-S1 fine-needle aspiration EXAM: L5-S1 FLUOROSCOPY GUIDED FINE-NEEDLE ASPIRATION MEDICATIONS: The patient is currently admitted to the hospital and receiving intravenous antibiotics. The antibiotics were administered within an appropriate time frame prior to the initiation of the procedure. ANESTHESIA/SEDATION: Fentanyl 25 mcg IV; Versed 1 mg IV Moderate Sedation Time:  31 The patient was continuously monitored during the procedure by the interventional radiology nurse under my direct supervision. COMPLICATIONS: None immediate. PROCEDURE: Informed written consent was obtained from the patient after a thorough discussion of the procedural risks, benefits and alternatives. All questions were addressed. Maximal Sterile Barrier Technique was utilized including caps, mask, sterile gowns, sterile gloves, sterile drape, hand hygiene and skin antiseptic. A timeout was performed prior to the initiation of the procedure. The patient was placed in prone position on the angiography table. The lumbar spine region was prepped and draped in a sterile fashion. Under fluoroscopy, the L5-S1 disc space was  delineated and the skin area was marked. The skin was infiltrated with a 1% Lidocaine approximately 7 cm lateral to the spinous process projection on the right. Using a 22-gauge spinal needle, the paraspinal soft tissues were infiltrated with Bupivacaine 0.5%. The spinal needle was then advanced into the L5-S1 disc space. A fine-needle aspiration sample was then collected and sent to laboratory for Gram stain culture. The  needle was subsequently withdrawn. The access sites were cleaned and covered with a sterile bandage. IMPRESSION: Fluoroscopy-guided L5-S1 fine-needle aspiration for Gram stain and culture. Electronically Signed   By: Pedro Earls M.D.   On: 07/21/2020 14:41    Marzetta Board, MD, PhD [facility]  Between 7 am - 7 pm I am available, please contact me via Amion (for emergencies) or Securechat (non urgent messages)  Between 7 pm - 7 am I am not available, please contact night coverage MD/APP via Amion

## 2020-07-21 NOTE — Progress Notes (Addendum)
I have seen and examined the patient. I have personally reviewed the clinical findings, laboratory findings, microbiological data and imaging studies. The assessment and treatment plan was discussed with the  APP, Janene Madeira.   I agree with their recommendations with the following comments:  Will follow up gram stain results in the morning to determine next steps.  Continue to hold antibiotics as he is otherwise stable.     [facility] 07/21/2020, [place]  Date of Admission:  07/17/2020      Total days of antibiotics 0          ASSESSMENT: Jason [name]. is a 60 y.o. male with ESRD admitted with back pain which is now improved on current analgesic regimen. MRI with concerns over possible osteomyelitis/discitis at L5-S1 vs ESRD related spondyloarthropathy. Fever of 100.6 documented on 7/03 but he reports no fevers since and none recorded during current admission > 96 h. Blood cultures are negative from 7/08. Inflammatory markers are not significantly elevated.  IR aspiration planned today; will see what gram stain shows in the AM to help determine further recommendations.    OK for discharge tomorrow if his pain control is adequate. We can follow up cultures and arrange outpatient care when these are mature. If negative suspect we will need to arrange repeat MRI in 2 weeks or so to follow for any further changes. Recommended w/ and w/o contrast so would need to time with HD team to have dialysis treatment after the study.    PLAN: Continue to hold antibiotics  Follow for micro results from FNA of L5-S1 today OK to discharge home if gram stain negative with close ID follow up outpatient    Principal Problem:   ESRD (end stage renal disease) on dialysis [facility]) Active Problems:   Essential hypertension   Chronic combined systolic and diastolic congestive  heart failure (HCC)   Hyperkalemia, diminished renal excretion   Volume overload   Acute hypoxemic respiratory failure (HCC)    amLODipine  10 mg Oral QPM   bupivacaine       carvedilol  25 mg Oral BID WC   Chlorhexidine Gluconate Cloth  6 each Topical Q0600   darbepoetin (ARANESP) injection - DIALYSIS  25 mcg Intravenous Q Sat-HD   [START ON 07/22/2020] doxercalciferol  9 mcg Intravenous Q T,Th,Sa-HD   hydrALAZINE  100 mg Oral TID   lidocaine       multivitamin  1 tablet Oral QHS   sevelamer carbonate  2,400-4,000 mg Oral TID WC    SUBJECTIVE: Back pain is improved on current medication regimen.  Agreeable to IR aspirate for culture of disc space.  No fevers / chills.    Review of Systems: Review of Systems  Constitutional:  Negative for chills, fever, malaise/fatigue and weight loss.  HENT:  Negative for sore throat.        No dental problems  Respiratory:  Negative for cough and sputum production.   Cardiovascular:  Negative for chest pain and leg swelling.  Gastrointestinal:  Negative for abdominal pain, diarrhea and vomiting.  Genitourinary:  Negative for dysuria and flank pain.  Musculoskeletal:  Positive for back pain. Negative for joint pain, myalgias and neck pain.  Skin:  Negative for rash.  Neurological:  Negative for dizziness, tingling and headaches.  Psychiatric/Behavioral:  Negative for depression and substance abuse. The patient  is not nervous/anxious and does not have insomnia.     Allergies  Allergen Reactions   Lisinopril Cough    OBJECTIVE: Vitals:   07/21/20 1340 07/21/20 1345 07/21/20 1350 07/21/20 1414  BP: (!) 166/100 (!) 148/97 (!) 170/95 (!) 161/96  Pulse: 72 70 69 67  Resp: 18 17 14 16   Temp:    98.2 F (36.8 C)  TempSrc:    Oral  SpO2: 100% 98% 98% 94%  Weight:       Body mass index is 21.8 kg/m.  Physical Exam Vitals reviewed.  Constitutional:      Appearance: He is well-developed.     Comments: Resting comfortably in bed.    HENT:     Mouth/Throat:     Dentition: Normal dentition. No dental abscesses.  Cardiovascular:     Rate and Rhythm: Normal rate and regular rhythm.     Heart sounds: Normal heart sounds.  Pulmonary:     Effort: Pulmonary effort is normal.     Breath sounds: Normal breath sounds.  Abdominal:     General: There is no distension.     Palpations: Abdomen is soft.     Tenderness: There is no abdominal tenderness.  Lymphadenopathy:     Cervical: No cervical adenopathy.  Skin:    General: Skin is warm and dry.     Capillary Refill: Capillary refill takes less than 2 seconds.     Findings: No rash.     Comments: L AVF in place and unremarkable.   Neurological:     Mental Status: He is alert and oriented to person, place, and time.  Psychiatric:        Judgment: Judgment normal.     Comments: In good spirits today    Lab Results Lab Results  Component Value Date   WBC 3.9 (L) 07/19/2020   HGB 9.8 (L) 07/19/2020   HCT 28.6 (L) 07/19/2020   MCV 89.7 07/19/2020   PLT 211 07/19/2020    Lab Results  Component Value Date   CREATININE 9.06 (H) 07/20/2020   BUN 51 (H) 07/20/2020   NA 134 (L) 07/20/2020   K 4.4 07/20/2020   CL 96 (L) 07/20/2020   CO2 27 07/20/2020    Lab Results  Component Value Date   ALT 23 07/17/2020   AST 21 07/17/2020   ALKPHOS 35 (L) 07/17/2020   BILITOT 0.8 07/17/2020     Microbiology: Recent Results (from the past 240 hour(s))  Resp Panel by RT-PCR (Flu A&B, Covid) Nasopharyngeal Swab     Status: None   Collection Time: 07/13/20 12:30 PM   Specimen: Nasopharyngeal Swab; Nasopharyngeal(NP) swabs in vial transport medium  Result Value Ref Range Status   SARS Coronavirus 2 by RT PCR NEGATIVE NEGATIVE Final    Comment: (NOTE) SARS-CoV-2 target nucleic acids are NOT DETECTED.  The SARS-CoV-2 RNA is generally detectable in upper respiratory specimens during the acute phase of infection. The lowest concentration of SARS-CoV-2 viral copies this assay  can detect is 138 copies/mL. A negative result does not preclude SARS-Cov-2 infection and should not be used as the sole basis for treatment or other patient management decisions. A negative result may occur with  improper specimen collection/handling, submission of specimen other than nasopharyngeal swab, presence of viral mutation(s) within the areas targeted by this assay, and inadequate number of viral copies(<138 copies/mL). A negative result must be combined with clinical observations, patient history, and epidemiological information. The expected result is Negative.  Fact Sheet  for Patients:  EntrepreneurPulse.com.au  Fact Sheet for Healthcare Providers:  IncredibleEmployment.be  This test is no t yet approved or cleared by the [place] FDA and  has been authorized for detection and/or diagnosis of SARS-CoV-2 by FDA under an Emergency Use Authorization (EUA). This EUA will remain  in effect (meaning this test can be used) for the duration of the COVID-19 declaration under Section 564(b)(1) of the Act, 21 U.S.C.section 360bbb-3(b)(1), unless the authorization is terminated  or revoked sooner.       Influenza A by PCR NEGATIVE NEGATIVE Final   Influenza B by PCR NEGATIVE NEGATIVE Final    Comment: (NOTE) The Xpert Xpress SARS-CoV-2/FLU/RSV plus assay is intended as an aid in the diagnosis of influenza from Nasopharyngeal swab specimens and should not be used as a sole basis for treatment. Nasal washings and aspirates are unacceptable for Xpert Xpress SARS-CoV-2/FLU/RSV testing.  Fact Sheet for Patients: EntrepreneurPulse.com.au  Fact Sheet for Healthcare Providers: IncredibleEmployment.be  This test is not yet approved or cleared by the [place] FDA and has been authorized for detection and/or diagnosis of SARS-CoV-2 by FDA under an Emergency Use Authorization (EUA). This EUA will  remain in effect (meaning this test can be used) for the duration of the COVID-19 declaration under Section 564(b)(1) of the Act, 21 U.S.C. section 360bbb-3(b)(1), unless the authorization is terminated or revoked.  Performed at [facility], [place] [address] [place], [place] [address]   Resp Panel by RT-PCR (Flu A&B, Covid) Nasopharyngeal Swab     Status: None   Collection Time: 07/17/20  5:29 PM   Specimen: Nasopharyngeal Swab; Nasopharyngeal(NP) swabs in vial transport medium  Result Value Ref Range Status   SARS Coronavirus 2 by RT PCR NEGATIVE NEGATIVE Final    Comment: (NOTE) SARS-CoV-2 target nucleic acids are NOT DETECTED.  The SARS-CoV-2 RNA is generally detectable in upper respiratory specimens during the acute phase of infection. The lowest concentration of SARS-CoV-2 viral copies this assay can detect is 138 copies/mL. A negative result does not preclude SARS-Cov-2 infection and should not be used as the sole basis for treatment or other patient management decisions. A negative result may occur with  improper specimen collection/handling, submission of specimen other than nasopharyngeal swab, presence of viral mutation(s) within the areas targeted by this assay, and inadequate number of viral copies(<138 copies/mL). A negative result must be combined with clinical observations, patient history, and epidemiological information. The expected result is Negative.  Fact Sheet for Patients:  EntrepreneurPulse.com.au  Fact Sheet for Healthcare Providers:  IncredibleEmployment.be  This test is no t yet approved or cleared by the [place] FDA and  has been authorized for detection and/or diagnosis of SARS-CoV-2 by FDA under an Emergency Use Authorization (EUA). This EUA will remain  in effect (meaning this test can be used) for the duration of the COVID-19 declaration under Section 564(b)(1) of the Act, 21 U.S.C.section  360bbb-3(b)(1), unless the authorization is terminated  or revoked sooner.       Influenza A by PCR NEGATIVE NEGATIVE Final   Influenza B by PCR NEGATIVE NEGATIVE Final    Comment: (NOTE) The Xpert Xpress SARS-CoV-2/FLU/RSV plus assay is intended as an aid in the diagnosis of influenza from Nasopharyngeal swab specimens and should not be used as a sole basis for treatment. Nasal washings and aspirates are unacceptable for Xpert Xpress SARS-CoV-2/FLU/RSV testing.  Fact Sheet for Patients: EntrepreneurPulse.com.au  Fact Sheet for Healthcare Providers: IncredibleEmployment.be  This test is not yet approved  or cleared by the [place] and has been authorized for detection and/or diagnosis of SARS-CoV-2 by FDA under an Emergency Use Authorization (EUA). This EUA will remain in effect (meaning this test can be used) for the duration of the COVID-19 declaration under Section 564(b)(1) of the Act, 21 U.S.C. section 360bbb-3(b)(1), unless the authorization is terminated or revoked.  Performed at [facility], [place] [address], [place], [place] [address]   Culture, blood (Routine X 2) w Reflex to ID Panel     Status: None (Preliminary result)   Collection Time: 07/18/20 11:16 AM   Specimen: BLOOD  Result Value Ref Range Status   Specimen Description BLOOD LEFT ANTECUBITAL  Final   Special Requests   Final    BOTTLES DRAWN AEROBIC AND ANAEROBIC Blood Culture adequate volume   Culture   Final    NO GROWTH 3 DAYS Performed at [facility], [place] [address], [place], [place] [address]    Report Status PENDING  Incomplete  Culture, blood (Routine X 2) w Reflex to ID Panel     Status: None (Preliminary result)   Collection Time: 07/18/20 11:16 AM   Specimen: BLOOD LEFT HAND  Result Value Ref Range Status   Specimen Description BLOOD LEFT HAND  Final   Special Requests   Final    BOTTLES DRAWN AEROBIC AND ANAEROBIC Blood Culture  adequate volume   Culture   Final    NO GROWTH 3 DAYS Performed at [facility], [place] [address], [place], [place] [address]    Report Status PENDING  Incomplete     Janene Madeira, MSN, NP-C [place] [facility] [email or web address] Pager: [phone] Office: [phone] [place]: [phone]

## 2020-07-21 NOTE — Plan of Care (Signed)
  Problem: Education: Goal: Knowledge of disease and its progression will improve Outcome: Progressing   Problem: Fluid Volume: Goal: Compliance with measures to maintain balanced fluid volume will improve Outcome: Progressing   Problem: Health Behavior/Discharge Planning: Goal: Ability to manage health-related needs will improve Outcome: Progressing   Problem: Nutritional: Goal: Ability to make healthy dietary choices will improve Outcome: Progressing

## 2020-07-21 NOTE — Procedures (Signed)
INTERVENTIONAL NEURORADIOLOGY BRIEF POSTPROCEDURE NOTE   FLUOROSCOPY GUIDED L5-S1 [place] ASPIRATION   Attending: Dr. Pedro Earls   Assistant: None.   Diagnosis: L5-S1 discitis.   Access site: Percutaneous.   Anesthesia: Moderate sedation.   Medication used: 1 mg Versed IV; 50 mcg Fentanyl IV.   Complications: None   Estimated blood loss: None   Specimen: L5-S1 disc FNA.   Findings: Decreased disc space at L5-S1 with endplate sclerosis and marginal osteophyte.  Fine-needle aspiration performed with a right posterolateral approach with a 20 gauge spinal needle.   The patient tolerated the procedure well without incident or complication andis in stable condition.

## 2020-07-22 LAB — RENAL FUNCTION PANEL
Albumin: 3.4 g/dL — ABNORMAL LOW (ref 3.5–5.0)
Anion gap: 13 (ref 5–15)
BUN: 85 mg/dL — ABNORMAL HIGH (ref 6–20)
CO2: 24 mmol/L (ref 22–32)
Calcium: 9 mg/dL (ref 8.9–10.3)
Chloride: 95 mmol/L — ABNORMAL LOW (ref 98–111)
Creatinine, Ser: 12.83 mg/dL — ABNORMAL HIGH (ref 0.61–1.24)
GFR, Estimated: 4 mL/min — ABNORMAL LOW (ref >60)
Glucose, Bld: 97 mg/dL (ref 70–99)
Phosphorus: 8.5 mg/dL — ABNORMAL HIGH (ref 2.5–4.6)
Potassium: 5.5 mmol/L — ABNORMAL HIGH (ref 3.5–5.1)
Sodium: 132 mmol/L — ABNORMAL LOW (ref 135–145)

## 2020-07-22 LAB — CBC
HCT: 31.9 % — ABNORMAL LOW (ref 39.0–52.0)
Hemoglobin: 10.9 g/dL — ABNORMAL LOW (ref 13.0–17.0)
MCH: 31 pg (ref 26.0–34.0)
MCHC: 34.2 g/dL (ref 30.0–36.0)
MCV: 90.6 fL (ref 80.0–100.0)
Platelets: 275 10*3/uL (ref 150–400)
RBC: 3.52 MIL/uL — ABNORMAL LOW (ref 4.22–5.81)
RDW: 13.1 % (ref 11.5–15.5)
WBC: 5.9 10*3/uL (ref 4.0–10.5)
nRBC: 0 % (ref 0.0–0.2)

## 2020-07-22 MED ORDER — METHOCARBAMOL 500 MG PO TABS: 500.0000 mg | ORAL_TABLET | Freq: Three times a day (TID) | ORAL | 0 refills | Status: DC | PRN

## 2020-07-22 MED ORDER — HYDROCODONE-ACETAMINOPHEN 5-325 MG PO TABS: 1.0000 | ORAL_TABLET | Freq: Four times a day (QID) | ORAL | 0 refills | Status: DC | PRN

## 2020-07-22 MED ORDER — DOXERCALCIFEROL 4 MCG/2ML IV SOLN
INTRAVENOUS | Status: AC
  Administered 2020-07-22: 9 ug via INTRAVENOUS
  Filled 2020-07-22: qty 6

## 2020-07-22 NOTE — Discharge Summary (Signed)
Physician Discharge Summary  Jason [name]. IFO:[id number] DOB: [birth year] DOA: 07/17/2020  PCP: [name], FNP (Inactive)  Admit date: 07/17/2020 Discharge date: 07/22/2020  Admitted From: home Disposition:  home  Recommendations for Outpatient Follow-up:  Follow up with PCP in 1-2 weeks  Home Health: none Equipment/Devices: none  Discharge Condition: stable CODE STATUS: Full code Diet recommendation: renal  HPI: Per admitting MD, Jason [name]. is a 60 y.o. male with medical history significant of ESRD on TTS dialysis, HTN, mod PAH, HFpEF (EF 50-55%, grade 1 DD in Feb 2021). Pt presents to ED with generalized weakness, low back pain.  Back pain ongoing for about a week now.  Missed dialysis on Sat due to low back pain had urgent dialysis in ED on Sun 7/3. Pt missed additional dialysis session on Tues this week. Presents to ED today for missed dialysis, generalized weakness, ongoing low back pain. No Saddle anesthesias, pain into legs (stays in lumbar region).  No CP, does have SOB though.  SOB worse over past 2 days since missing dialysis.  Hospital Course / Discharge diagnoses: Principal Problem Acute hypoxic respiratory failure in the setting of acute pulmonary edema due to acute on chronic diastolic CHF in the setting of missed dialysis-nephrology consulted, patient was dialyzed, his respiratory status improved and he is on room air and clinically returned to baseline.  He will be discharged home in stable condition with outpatient follow-up with dialysis center.   Active Problems ESRD, hyperkalemia-dialysis per nephrology Suspect discitis/osteomyelitis-MRI done on admission without contrast showed an area at the L5/S1 suspect for discitis/osteomyelitis.  Patient without any fever or chills in the last several weeks, not an IV drug user.  ID was consulted and followed patient while hospitalized.  He was monitored off antibiotics, afebrile, WBC normal.  Blood cultures  were sent and are without growth at the time of discharge.  IR was consulted and underwent lumbar disc aspiration on 7/11, gram stain negative.  He is clinically at baseline, back pain is well controlled on oral agents and will be discharged home in stable condition.  Case was discussed with ID.  Patient knows that if cultures become positive in the future he will be called to return to the hospital Essential hypertension-continue home regimen Anemia in the setting of end-stage renal disease-hemoglobin stable, monitor  Sepsis ruled out   Discharge Instructions   Allergies as of 07/22/2020       Reactions   Lisinopril Cough        Medication List     TAKE these medications    acetaminophen 500 MG tablet Commonly known as: TYLENOL Take 1,000 mg by mouth every 6 (six) hours as needed for moderate pain or headache.   amLODipine 10 MG tablet Commonly known as: NORVASC Take 10 mg by mouth every evening.   carvedilol 25 MG tablet Commonly known as: COREG Take 1 tablet (25 mg total) by mouth 2 (two) times daily with a meal.   doxercalciferol 0.5 MCG capsule Commonly known as: HECTOROL Doxercalciferol (Hectorol)   hydrALAZINE 100 MG tablet Commonly known as: APRESOLINE Take 1 tablet (100 mg total) by mouth 3 (three) times daily.   HYDROcodone-acetaminophen 5-325 MG tablet Commonly known as: NORCO/VICODIN Take 1 tablet by mouth every 6 (six) hours as needed for severe pain.   iron sucrose in sodium chloride 0.9 % 100 mL Iron Sucrose (Venofer)   methocarbamol 500 MG tablet Commonly known as: ROBAXIN Take 1 tablet (500 mg total)  by mouth every 8 (eight) hours as needed for muscle spasms. What changed:  when to take this reasons to take this   MIRCERA IJ Mircera   multivitamin Tabs tablet Take 1 tablet by mouth daily.   polyvinyl alcohol 1.4 % ophthalmic solution Commonly known as: LIQUIFILM TEARS Place 1 drop into both eyes as needed for dry eyes.   sevelamer  carbonate 800 MG tablet Commonly known as: RENVELA Take 2,400-4,000 mg by mouth 3 (three) times daily.         Consultations: Nephrology Infectious disease  Procedures/Studies:  DG Chest 2 View  Result Date: 07/17/2020 CLINICAL DATA:  60 year old male with shortness of breath and cough for 1 week. EXAM: CHEST - 2 VIEW COMPARISON:  July 13, 2020. FINDINGS: Image mildly rotated to the LEFT. Accounting for this cardiomediastinal contours and hilar structures are stable. No lobar consolidation or sign of pleural effusion. Signs of vascular crowding/pulmonary vascular congestion. No visible pneumothorax. No effusion. On limited assessment no acute skeletal process. IMPRESSION: Cardiomegaly with pulmonary vascular congestion as before. Electronically Signed   By: Zetta Bills M.D.   On: 07/17/2020 12:54   DG Lumbar Spine Complete  Result Date: 07/17/2020 CLINICAL DATA:  Back pain EXAM: LUMBAR SPINE - COMPLETE 4+ VIEW COMPARISON:  None. FINDINGS: Straightening of the lumbar spine. Vertebral body heights are maintained. Moderate disc space narrowing and degenerative change at L5-S1. Facet degenerative changes of the lower lumbar spine. IMPRESSION: Straightening of the lumbar spine with moderate to marked degenerative change at L5-S1 Electronically Signed   By: Donavan Foil M.D.   On: 07/17/2020 21:48   MR LUMBAR SPINE WO CONTRAST  Result Date: 07/18/2020 CLINICAL DATA:  Weakness and back pain. History end-stage renal disease and anemia. Infection suspected. EXAM: MRI LUMBAR SPINE WITHOUT CONTRAST TECHNIQUE: Multiplanar, multisequence MR imaging of the lumbar spine was performed. No intravenous contrast was administered. COMPARISON:  Lumbar spine radiographs 07/17/2020. FINDINGS: Segmentation: Conventional anatomy assumed, with the last open disc space designated L5-S1. Alignment:  Straightening without focal angulation or listhesis. Vertebrae: As demonstrated radiographically, there is advanced disc  space narrowing at L5-S1 with endplate osteophytes. There is prominent T2 hyperintensity within the L5-S1 disc, and there are associated endplate erosions and edema. No other suspicious disc space findings. The visualized sacroiliac joints appear unremarkable. Conus medullaris: Extends to the T12-L1 level and appears normal. Paraspinal and other soft tissues: Mild paraspinal inflammatory changes anteriorly at L5-S1 without focal fluid collection. Disc levels: L1-2: Disc height and hydration are maintained. Mild anterior osteophyte formation. No spinal stenosis or nerve root encroachment. L2-3: Mild disc bulging and anterior osteophyte formation. Mild facet hypertrophy. No spinal stenosis or nerve root encroachment. L3-4: The disc appears normal. Mild facet hypertrophy. No spinal stenosis or nerve root encroachment. L4-5: The disc appears normal. Mild bilateral facet hypertrophy. No spinal stenosis or nerve root encroachment. L5-S1: As above, chronic degenerative disc disease with loss of disc height, annular disc bulging and endplate osteophytes. Moderate facet and ligamentous hypertrophy. Moderate foraminal narrowing is present bilaterally. There is no epidural fluid collection. IMPRESSION: 1. Nonspecific, but suspicious, disc and endplate changes at T6-A2 as described. Findings could relate to dialysis-related spondyloarthropathy, although the paraspinal inflammatory changes are moderately suspicious for diskitis and early osteomyelitis. Consider disc space aspiration and/or short-term MR follow-up without and with contrast. 2. No focal fluid collection or epidural inflammation identified. 3. Moderate foraminal narrowing bilaterally at L5-S1. 4. No other significant disc space findings. Electronically Signed   By: Gwyndolyn Saxon  Lin Landsman M.D.   On: 07/18/2020 10:05   DG Chest Port 1 View  Result Date: 07/13/2020 CLINICAL DATA:  Shortness of breath.  Hypoxia. EXAM: PORTABLE CHEST 1 VIEW COMPARISON:  10/30/2019 FINDINGS:  The heart is enlarged and stable in configuration. Lungs are free of focal consolidations and pleural effusions. No pulmonary edema. IMPRESSION: Stable cardiomegaly. Electronically Signed   By: Nolon Nations M.D.   On: 07/13/2020 11:53   IR LUMBAR DISC ASPIRATION W/IMG GUIDE  Result Date: 07/21/2020 INDICATION: Prentice Sackrider. is a 60 y.o. male with past medical history significant of asthma, ESRD on renal replacement therapy, HTN, CHF, bilateral pleural effusion, chronic venous insufficiency, who presented to [facility] ED due to weakness and back pain, currently admitted due to acute hypoxic respiratory failure in the setting of acute pulmonary edema due to acute on chronic systolic CHF in setting of missed dialysis. Has a workup for back pain, he underwent MRI of lumbar spine without contrast on 07/18/2020 with findings concerning for possible L5-S1 discitis. He comes to our service today for a fluoroscopy guided L5-S1 fine-needle aspiration EXAM: L5-S1 FLUOROSCOPY GUIDED FINE-NEEDLE ASPIRATION MEDICATIONS: The patient is currently admitted to the hospital and receiving intravenous antibiotics. The antibiotics were administered within an appropriate time frame prior to the initiation of the procedure. ANESTHESIA/SEDATION: Fentanyl 25 mcg IV; Versed 1 mg IV Moderate Sedation Time:  31 The patient was continuously monitored during the procedure by the interventional radiology nurse under my direct supervision. COMPLICATIONS: None immediate. PROCEDURE: Informed written consent was obtained from the patient after a thorough discussion of the procedural risks, benefits and alternatives. All questions were addressed. Maximal Sterile Barrier Technique was utilized including caps, mask, sterile gowns, sterile gloves, sterile drape, hand hygiene and skin antiseptic. A timeout was performed prior to the initiation of the procedure. The patient was placed in prone position on the angiography table. The lumbar spine region  was prepped and draped in a sterile fashion. Under fluoroscopy, the L5-S1 disc space was delineated and the skin area was marked. The skin was infiltrated with a 1% Lidocaine approximately 7 cm lateral to the spinous process projection on the right. Using a 22-gauge spinal needle, the paraspinal soft tissues were infiltrated with Bupivacaine 0.5%. The spinal needle was then advanced into the L5-S1 disc space. A fine-needle aspiration sample was then collected and sent to laboratory for Gram stain culture. The needle was subsequently withdrawn. The access sites were cleaned and covered with a sterile bandage. IMPRESSION: Fluoroscopy-guided L5-S1 fine-needle aspiration for Gram stain and culture. Electronically Signed   By: Pedro Earls M.D.   On: 07/21/2020 14:41     Subjective: - no chest pain, shortness of breath, no abdominal pain, nausea or vomiting.   Discharge Exam: BP (!) 141/75   Pulse 63   Temp 97.6 F (36.4 C) (Oral)   Resp 20   Wt 80.9 kg   SpO2 96%   BMI 22.90 kg/m   General: Pt is alert, awake, not in acute distress Cardiovascular: RRR, S1/S2 +, no rubs, no gallops Respiratory: CTA bilaterally, no wheezing, no rhonchi Abdominal: Soft, NT, ND, bowel sounds + Extremities: no edema, no cyanosis    The results of significant diagnostics from this hospitalization (including imaging, microbiology, ancillary and laboratory) are listed below for reference.     Microbiology: Recent Results (from the past 240 hour(s))  Resp Panel by RT-PCR (Flu A&B, Covid) Nasopharyngeal Swab     Status: None   Collection Time:  07/13/20 12:30 PM   Specimen: Nasopharyngeal Swab; Nasopharyngeal(NP) swabs in vial transport medium  Result Value Ref Range Status   SARS Coronavirus 2 by RT PCR NEGATIVE NEGATIVE Final    Comment: (NOTE) SARS-CoV-2 target nucleic acids are NOT DETECTED.  The SARS-CoV-2 RNA is generally detectable in upper respiratory specimens during the acute phase  of infection. The lowest concentration of SARS-CoV-2 viral copies this assay can detect is 138 copies/mL. A negative result does not preclude SARS-Cov-2 infection and should not be used as the sole basis for treatment or other patient management decisions. A negative result may occur with  improper specimen collection/handling, submission of specimen other than nasopharyngeal swab, presence of viral mutation(s) within the areas targeted by this assay, and inadequate number of viral copies(<138 copies/mL). A negative result must be combined with clinical observations, patient history, and epidemiological information. The expected result is Negative.  Fact Sheet for Patients:  EntrepreneurPulse.com.au  Fact Sheet for Healthcare Providers:  IncredibleEmployment.be  This test is no t yet approved or cleared by the [place] FDA and  has been authorized for detection and/or diagnosis of SARS-CoV-2 by FDA under an Emergency Use Authorization (EUA). This EUA will remain  in effect (meaning this test can be used) for the duration of the COVID-19 declaration under Section 564(b)(1) of the Act, 21 U.S.C.section 360bbb-3(b)(1), unless the authorization is terminated  or revoked sooner.       Influenza A by PCR NEGATIVE NEGATIVE Final   Influenza B by PCR NEGATIVE NEGATIVE Final    Comment: (NOTE) The Xpert Xpress SARS-CoV-2/FLU/RSV plus assay is intended as an aid in the diagnosis of influenza from Nasopharyngeal swab specimens and should not be used as a sole basis for treatment. Nasal washings and aspirates are unacceptable for Xpert Xpress SARS-CoV-2/FLU/RSV testing.  Fact Sheet for Patients: EntrepreneurPulse.com.au  Fact Sheet for Healthcare Providers: IncredibleEmployment.be  This test is not yet approved or cleared by the [place] FDA and has been authorized for detection and/or diagnosis of  SARS-CoV-2 by FDA under an Emergency Use Authorization (EUA). This EUA will remain in effect (meaning this test can be used) for the duration of the COVID-19 declaration under Section 564(b)(1) of the Act, 21 U.S.C. section 360bbb-3(b)(1), unless the authorization is terminated or revoked.  Performed at [facility], [place] [address] [place], [place] [address]   Resp Panel by RT-PCR (Flu A&B, Covid) Nasopharyngeal Swab     Status: None   Collection Time: 07/17/20  5:29 PM   Specimen: Nasopharyngeal Swab; Nasopharyngeal(NP) swabs in vial transport medium  Result Value Ref Range Status   SARS Coronavirus 2 by RT PCR NEGATIVE NEGATIVE Final    Comment: (NOTE) SARS-CoV-2 target nucleic acids are NOT DETECTED.  The SARS-CoV-2 RNA is generally detectable in upper respiratory specimens during the acute phase of infection. The lowest concentration of SARS-CoV-2 viral copies this assay can detect is 138 copies/mL. A negative result does not preclude SARS-Cov-2 infection and should not be used as the sole basis for treatment or other patient management decisions. A negative result may occur with  improper specimen collection/handling, submission of specimen other than nasopharyngeal swab, presence of viral mutation(s) within the areas targeted by this assay, and inadequate number of viral copies(<138 copies/mL). A negative result must be combined with clinical observations, patient history, and epidemiological information. The expected result is Negative.  Fact Sheet for Patients:  EntrepreneurPulse.com.au  Fact Sheet for Healthcare Providers:  IncredibleEmployment.be  This test is no t  yet approved or cleared by the [place] and  has been authorized for detection and/or diagnosis of SARS-CoV-2 by FDA under an Emergency Use Authorization (EUA). This EUA will remain  in effect (meaning this test can be used) for the duration of  the COVID-19 declaration under Section 564(b)(1) of the Act, 21 U.S.C.section 360bbb-3(b)(1), unless the authorization is terminated  or revoked sooner.       Influenza A by PCR NEGATIVE NEGATIVE Final   Influenza B by PCR NEGATIVE NEGATIVE Final    Comment: (NOTE) The Xpert Xpress SARS-CoV-2/FLU/RSV plus assay is intended as an aid in the diagnosis of influenza from Nasopharyngeal swab specimens and should not be used as a sole basis for treatment. Nasal washings and aspirates are unacceptable for Xpert Xpress SARS-CoV-2/FLU/RSV testing.  Fact Sheet for Patients: EntrepreneurPulse.com.au  Fact Sheet for Healthcare Providers: IncredibleEmployment.be  This test is not yet approved or cleared by the [place] FDA and has been authorized for detection and/or diagnosis of SARS-CoV-2 by FDA under an Emergency Use Authorization (EUA). This EUA will remain in effect (meaning this test can be used) for the duration of the COVID-19 declaration under Section 564(b)(1) of the Act, 21 U.S.C. section 360bbb-3(b)(1), unless the authorization is terminated or revoked.  Performed at [facility], [place] [address] [place], [place] [address]   Culture, blood (Routine X 2) w Reflex to ID Panel     Status: None (Preliminary result)   Collection Time: 07/18/20 11:16 AM   Specimen: BLOOD  Result Value Ref Range Status   Specimen Description BLOOD LEFT ANTECUBITAL  Final   Special Requests   Final    BOTTLES DRAWN AEROBIC AND ANAEROBIC Blood Culture adequate volume   Culture   Final    NO GROWTH 4 DAYS Performed at [facility], [place] [address], [place], [place] [address]    Report Status PENDING  Incomplete  Culture, blood (Routine X 2) w Reflex to ID Panel     Status: None (Preliminary result)   Collection Time: 07/18/20 11:16 AM   Specimen: BLOOD LEFT HAND  Result Value Ref Range Status   Specimen Description BLOOD LEFT HAND  Final    Special Requests   Final    BOTTLES DRAWN AEROBIC AND ANAEROBIC Blood Culture adequate volume   Culture   Final    NO GROWTH 4 DAYS Performed at [facility], [place] [address], [place], [place] [address]    Report Status PENDING  Incomplete  Body fluid culture w Gram Stain     Status: None (Preliminary result)   Collection Time: 07/21/20  1:57 PM   Specimen: PATH Disc; Body Fluid  Result Value Ref Range Status   Specimen Description FLUID  Final   Special Requests L5 S1 DISC ASPIRATION SPEC A  Final   Gram Stain   Final    NO WBC SEEN NO ORGANISMS SEEN Performed at [facility], [address], [place], [place] [address]    Culture PENDING  Incomplete   Report Status PENDING  Incomplete     Labs: Basic Metabolic Panel: Recent Labs  Lab 07/17/20 2329 07/18/20 0628 07/19/20 0209 07/20/20 1000 07/22/20 0629  NA 136 135 136 134* 132*  K 5.2* 3.6 4.5 4.4 5.5*  CL 96* 94* 95* 96* 95*  CO2 20* 29 29 27 24   GLUCOSE 132* 94 105* 98 97  BUN 134* 52* 71* 51* 85*  CREATININE 17.14* 8.66* 11.21* 9.06* 12.83*  CALCIUM 8.3* 8.3* 8.5* 8.7* 9.0  PHOS  --   --   --  8.3* 8.5*   Liver Function Tests: Recent Labs  Lab 07/17/20 1201 07/20/20 1000 07/22/20 0629  AST 21  --   --   ALT 23  --   --   ALKPHOS 35*  --   --   BILITOT 0.8  --   --   PROT 6.4*  --   --   ALBUMIN 3.3* 3.0* 3.4*   CBC: Recent Labs  Lab 07/17/20 1201 07/18/20 0628 07/19/20 0209 07/22/20 0629  WBC 4.7 4.2 3.9* 5.9  NEUTROABS 3.6  --   --   --   HGB 9.5* 9.6* 9.8* 10.9*  HCT 29.0* 28.6* 28.6* 31.9*  MCV 93.2 90.5 89.7 90.6  PLT 201 212 211 275   CBG: No results for input(s): GLUCAP in the last 168 hours. Hgb A1c No results for input(s): HGBA1C in the last 72 hours. Lipid Profile No results for input(s): CHOL, HDL, LDLCALC, TRIG, CHOLHDL, LDLDIRECT in the last 72 hours. Thyroid function studies No results for input(s): TSH, T4TOTAL, T3FREE, THYROIDAB in the last 72 hours.  Invalid  input(s): FREET3 Urinalysis    Component Value Date/Time   COLORURINE YELLOW 10/30/2019 2335   APPEARANCEUR HAZY (A) 10/30/2019 2335   LABSPEC 1.013 10/30/2019 2335   PHURINE 7.0 10/30/2019 2335   GLUCOSEU 50 (A) 10/30/2019 2335   HGBUR NEGATIVE 10/30/2019 2335   BILIRUBINUR NEGATIVE 10/30/2019 2335   BILIRUBINUR Negative 03/19/2019 1354   KETONESUR 5 (A) 10/30/2019 2335   PROTEINUR >=300 (A) 10/30/2019 2335   UROBILINOGEN 0.2 03/19/2019 1354   UROBILINOGEN 0.2 02/26/2015 1028   NITRITE NEGATIVE 10/30/2019 2335   LEUKOCYTESUR NEGATIVE 10/30/2019 2335    FURTHER DISCHARGE INSTRUCTIONS:   Get Medicines reviewed and adjusted: Please take all your medications with you for your next visit with your Primary MD   Laboratory/radiological data: Please request your Primary MD to go over all hospital tests and procedure/radiological results at the follow up, please ask your Primary MD to get all Hospital records sent to his/her office.   In some cases, they will be blood work, cultures and biopsy results pending at the time of your discharge. Please request that your primary care M.D. goes through all the records of your hospital data and follows up on these results.   Also Note the following: If you experience worsening of your admission symptoms, develop shortness of breath, life threatening emergency, suicidal or homicidal thoughts you must seek medical attention immediately by calling 911 or calling your MD immediately  if symptoms less severe.   You must read complete instructions/literature along with all the possible adverse reactions/side effects for all the Medicines you take and that have been prescribed to you. Take any new Medicines after you have completely understood and accpet all the possible adverse reactions/side effects.    Do not drive when taking Pain medications or sleeping medications (Benzodaizepines)   Do not take more than prescribed Pain, Sleep and Anxiety  Medications. It is not advisable to combine anxiety,sleep and pain medications without talking with your primary care practitioner   Special Instructions: If you have smoked or chewed Tobacco  in the last 2 yrs please stop smoking, stop any regular Alcohol  and or any Recreational drug use.   Wear Seat belts while driving.   Please note: You were cared for by a hospitalist during your hospital stay. Once you are discharged, your primary care  physician will handle any further medical issues. Please note that NO REFILLS for any discharge medications will be authorized once you are discharged, as it is imperative that you return to your primary care physician (or establish a relationship with a primary care physician if you do not have one) for your post hospital discharge needs so that they can reassess your need for medications and monitor your lab values.  Time coordinating discharge: 40 minutes  SIGNED:  Marzetta Board, MD, PhD 07/22/2020, 8:55 AM

## 2020-07-22 NOTE — Progress Notes (Signed)
[facility] Progress Note   Subjective:     Pt seen on HD, in good spirits, no c/o  Objective Vitals:   07/22/20 1130 07/22/20 1154 07/22/20 1204 07/22/20 1241  BP: (!) 155/93 (!) 145/99 (!) 165/99 (!) 166/97  Pulse: 67 65  (!) 58  Resp:   20 15  Temp:   97.7 F (36.5 C) 98.8 F (37.1 C)  TempSrc:   Oral Oral  SpO2:   95% 91%  Weight:   74.9 kg    Physical Exam General: Well-appearing male; NAD Heart: Normal S1 and S2; No murmurs, gallops, or rubs Lungs: Clear anteriorly and laterally; No wheezing, rales, or rhonchi Abdomen: Soft and non-tender Extremities: No edema BLLE Dialysis Access: RUE AVF (+) thrill    Filed Weights   07/22/20 0351 07/22/20 0810 07/22/20 1204  Weight: 80.4 kg 80.9 kg 74.9 kg    Intake/Output Summary (Last 24 hours) at 07/22/2020 1651 Last data filed at 07/22/2020 1300 Gross per 24 hour  Intake 940 ml  Output 5000 ml  Net -4060 ml     Additional Objective Labs: Basic Metabolic Panel: Recent Labs  Lab 07/19/20 0209 07/20/20 1000 07/22/20 0629  NA 136 134* 132*  K 4.5 4.4 5.5*  CL 95* 96* 95*  CO2 29 27 24   GLUCOSE 105* 98 97  BUN 71* 51* 85*  CREATININE 11.21* 9.06* 12.83*  CALCIUM 8.5* 8.7* 9.0  PHOS  --  8.3* 8.5*    Liver Function Tests: Recent Labs  Lab 07/17/20 1201 07/20/20 1000 07/22/20 0629  AST 21  --   --   ALT 23  --   --   ALKPHOS 35*  --   --   BILITOT 0.8  --   --   PROT 6.4*  --   --   ALBUMIN 3.3* 3.0* 3.4*    No results for input(s): LIPASE, AMYLASE in the last 168 hours. CBC: Recent Labs  Lab 07/17/20 1201 07/18/20 0628 07/19/20 0209 07/22/20 0629  WBC 4.7 4.2 3.9* 5.9  NEUTROABS 3.6  --   --   --   HGB 9.5* 9.6* 9.8* 10.9*  HCT 29.0* 28.6* 28.6* 31.9*  MCV 93.2 90.5 89.7 90.6  PLT 201 212 211 275    Blood Culture    Component Value Date/Time   SDES FLUID 07/21/2020 1357   SPECREQUEST L5 S1 DISC ASPIRATION SPEC A 07/21/2020 1357   CULT  07/21/2020 1357    NO GROWTH <  24 HOURS Performed at [facility], [place] [address] [place], [place] [address]    REPTSTATUS PENDING 07/21/2020 1357    Cardiac Enzymes: No results for input(s): CKTOTAL, CKMB, CKMBINDEX, TROPONINI in the last 168 hours. CBG: No results for input(s): GLUCAP in the last 168 hours. Iron Studies: No results for input(s): IRON, TIBC, TRANSFERRIN, FERRITIN in the last 72 hours. Lab Results  Component Value Date   INR 1.2 07/21/2020   INR 1.2 10/30/2019   Studies/Results: IR LUMBAR DISC ASPIRATION W/IMG GUIDE  Result Date: 07/21/2020 INDICATION: Sai Zinn. is a 60 y.o. male with past medical history significant of asthma, ESRD on renal replacement therapy, HTN, CHF, bilateral pleural effusion, chronic venous insufficiency, who presented to [facility] ED due to weakness and back pain, currently admitted due to acute hypoxic respiratory failure in the setting of acute pulmonary edema due to acute on chronic systolic CHF in setting of missed dialysis. Has a workup for back pain, he underwent MRI of  lumbar spine without contrast on 07/18/2020 with findings concerning for possible L5-S1 discitis. He comes to our service today for a fluoroscopy guided L5-S1 fine-needle aspiration EXAM: L5-S1 FLUOROSCOPY GUIDED FINE-NEEDLE ASPIRATION MEDICATIONS: The patient is currently admitted to the hospital and receiving intravenous antibiotics. The antibiotics were administered within an appropriate time frame prior to the initiation of the procedure. ANESTHESIA/SEDATION: Fentanyl 25 mcg IV; Versed 1 mg IV Moderate Sedation Time:  31 The patient was continuously monitored during the procedure by the interventional radiology nurse under my direct supervision. COMPLICATIONS: None immediate. PROCEDURE: Informed written consent was obtained from the patient after a thorough discussion of the procedural risks, benefits and alternatives. All questions were addressed. Maximal Sterile Barrier Technique was utilized including  caps, mask, sterile gowns, sterile gloves, sterile drape, hand hygiene and skin antiseptic. A timeout was performed prior to the initiation of the procedure. The patient was placed in prone position on the angiography table. The lumbar spine region was prepped and draped in a sterile fashion. Under fluoroscopy, the L5-S1 disc space was delineated and the skin area was marked. The skin was infiltrated with a 1% Lidocaine approximately 7 cm lateral to the spinous process projection on the right. Using a 22-gauge spinal needle, the paraspinal soft tissues were infiltrated with Bupivacaine 0.5%. The spinal needle was then advanced into the L5-S1 disc space. A fine-needle aspiration sample was then collected and sent to laboratory for Gram stain culture. The needle was subsequently withdrawn. The access sites were cleaned and covered with a sterile bandage. IMPRESSION: Fluoroscopy-guided L5-S1 fine-needle aspiration for Gram stain and culture. Electronically Signed   By: Pedro Earls M.D.   On: 07/21/2020 14:41    Medications:   amLODipine  10 mg Oral QPM   carvedilol  25 mg Oral BID WC   Chlorhexidine Gluconate Cloth  6 each Topical Q0600   darbepoetin (ARANESP) injection - DIALYSIS  25 mcg Intravenous Q Sat-HD   doxercalciferol  9 mcg Intravenous Q T,Th,Sa-HD   hydrALAZINE  100 mg Oral TID   multivitamin  1 tablet Oral QHS   sevelamer carbonate  2,400-4,000 mg Oral TID WC    Dialysis Orders: TTS @ [place] -> last HD there on 6/30, dialyzed at [facility] 7/3 4:15hr, 200dialyzer, 400/800, EDW 76kg, 2K/2Ca, AVF, heparin 6000 - Hectoral 1mcg IV q HD - Venofer 50/week -Micera 71mcg q 2 weeks-last dose 07/01/20  Assessment/Plan: Back pain: L5-S1 bulging disk, suspicious for diskitis per imaging, but no recent known bacteremic events -> blood cultures from 7/8-no growth X 2 days. IR aspiration of disc space done 7/11 w/ negative Gram stain and cultures pending. Per ID back pain is improving w/o abx  and inflamm markers are not impressive. Plan is for no abx now, watchful waiting, f/u disc space cx's and f/u imaging. Will f/u in ID clinic. For dc today.   ESRD:  continue TTS schedule. Had HD this am.  Hypertension/volume: BP originally high-now improved after resuming home meds and pushing UF. Continue to monitor BP trends. Anemia: Hgb 9.8 - Aranesp dose given 7/9. Metabolic bone disease: Ca ok, Phos usually sky high - now 8.3-continue home bidners and VDRA resumed.    [name], MD 07/22/2020 4:51 PM

## 2020-07-22 NOTE — Clinical Social Work Note (Signed)
Patient discharged today and was assisted with transportation by [facility] arranged transport. He was transported to his car, which was located at [employer], then he would drive himself home. Patient signed Davene Costain and Release of Liability form and this was faxed to [employer].  Addy Mcmannis Givens, MSW, LCSW Licensed Clinical Social Worker [place] [phone]

## 2020-07-22 NOTE — Progress Notes (Signed)
[facility] for Infectious Disease  Date of Admission:  07/17/2020           Reason for visit: Follow up on possible discitis  Current antibiotics: None  ASSESSMENT:    61 yo man with ESRD on HD a/w back pain that is improving with supportive care.  MRI earlier this admission with concerns for possible OM/discitis vs ESRD related spondyloarthropathy.  Blood cx negative thus far, IR aspirate of disc space done 7/11 with negative Gram stain and cultures pending.  Inflammatory markers are not very impressive and back pain is improving without antibiotics. Discussed with patient empiric treatment vs watchful waiting and follow up imaging.  He prefers the latter approach.  PLAN:    Will arrange [facility] follow up where repeat imaging and reassessment can take place Will hold off empiric treatment for now  FOllow up at [place] on 08/22/20 at 94 am with me Please call as needed   Principal Problem:   ESRD (end stage renal disease) on dialysis [facility]) Active Problems:   Essential hypertension   Chronic combined systolic and diastolic congestive heart failure (HCC)   Hyperkalemia, diminished renal excretion   Volume overload   Acute hypoxemic respiratory failure ([place])    MEDICATIONS:    Scheduled Meds:  doxercalciferol       amLODipine  10 mg Oral QPM   carvedilol  25 mg Oral BID WC   Chlorhexidine Gluconate Cloth  6 each Topical Q0600   darbepoetin (ARANESP) injection - DIALYSIS  25 mcg Intravenous Q Sat-HD   doxercalciferol  9 mcg Intravenous Q T,Th,Sa-HD   hydrALAZINE  100 mg Oral TID   multivitamin  1 tablet Oral QHS   sevelamer carbonate  2,400-4,000 mg Oral TID WC   Continuous Infusions: PRN Meds:.acetaminophen **OR** acetaminophen, hydrALAZINE, HYDROcodone-acetaminophen, methocarbamol, ondansetron **OR** ondansetron (ZOFRAN) IV, polyvinyl alcohol  SUBJECTIVE:   24 hour events:  S/p IR aspiration Gram stain negative Cx pending Afebrile WBC normal  Pt seen on HD  this morning Rates back pain as a 4.  No fevers, chill, n/v/d. No other complaints.   Review of Systems  All other systems reviewed and are negative.    OBJECTIVE:   Blood pressure (!) 141/75, pulse 63, temperature 97.6 F (36.4 C), temperature source Oral, resp. rate 20, weight 80.9 kg, SpO2 96 %. Body mass index is 22.9 kg/m.  Physical Exam Constitutional:      General: He is not in acute distress.    Appearance: Normal appearance.  HENT:     Head: Normocephalic and atraumatic.  Pulmonary:     Effort: Pulmonary effort is normal. No respiratory distress.  Musculoskeletal:     Comments: Moving lower extremities. On HD  Skin:    General: Skin is warm and dry.     Findings: No rash.  Neurological:     General: No focal deficit present.     Mental Status: He is alert and oriented to person, place, and time.  Psychiatric:        Mood and Affect: Mood normal.        Behavior: Behavior normal.     Lab Results: Lab Results  Component Value Date   WBC 5.9 07/22/2020   HGB 10.9 (L) 07/22/2020   HCT 31.9 (L) 07/22/2020   MCV 90.6 07/22/2020   PLT 275 07/22/2020    Lab Results  Component Value Date   NA 132 (L) 07/22/2020   K 5.5 (H) 07/22/2020   CO2  24 07/22/2020   GLUCOSE 97 07/22/2020   BUN 85 (H) 07/22/2020   CREATININE 12.83 (H) 07/22/2020   CALCIUM 9.0 07/22/2020   GFRNONAA 4 (L) 07/22/2020   GFRAA 5 (L) 04/29/2019    Lab Results  Component Value Date   ALT 23 07/17/2020   AST 21 07/17/2020   ALKPHOS 35 (L) 07/17/2020   BILITOT 0.8 07/17/2020       Component Value Date/Time   CRP 3.6 (H) 07/19/2020 0209       Component Value Date/Time   ESRSEDRATE 12 07/19/2020 0209     I have reviewed the micro and lab results in Epic.  Imaging: IR LUMBAR DISC ASPIRATION W/IMG GUIDE  Result Date: 07/21/2020 INDICATION: Jason [name]. is a 60 y.o. male with past medical history significant of asthma, ESRD on renal replacement therapy, HTN, CHF, bilateral  pleural effusion, chronic venous insufficiency, who presented to [facility] ED due to weakness and back pain, currently admitted due to acute hypoxic respiratory failure in the setting of acute pulmonary edema due to acute on chronic systolic CHF in setting of missed dialysis. Has a workup for back pain, he underwent MRI of lumbar spine without contrast on 07/18/2020 with findings concerning for possible L5-S1 discitis. He comes to our service today for a fluoroscopy guided L5-S1 fine-needle aspiration EXAM: L5-S1 FLUOROSCOPY GUIDED FINE-NEEDLE ASPIRATION MEDICATIONS: The patient is currently admitted to the hospital and receiving intravenous antibiotics. The antibiotics were administered within an appropriate time frame prior to the initiation of the procedure. ANESTHESIA/SEDATION: Fentanyl 25 mcg IV; Versed 1 mg IV Moderate Sedation Time:  31 The patient was continuously monitored during the procedure by the interventional radiology nurse under my direct supervision. COMPLICATIONS: None immediate. PROCEDURE: Informed written consent was obtained from the patient after a thorough discussion of the procedural risks, benefits and alternatives. All questions were addressed. Maximal Sterile Barrier Technique was utilized including caps, mask, sterile gowns, sterile gloves, sterile drape, hand hygiene and skin antiseptic. A timeout was performed prior to the initiation of the procedure. The patient was placed in prone position on the angiography table. The lumbar spine region was prepped and draped in a sterile fashion. Under fluoroscopy, the L5-S1 disc space was delineated and the skin area was marked. The skin was infiltrated with a 1% Lidocaine approximately 7 cm lateral to the spinous process projection on the right. Using a 22-gauge spinal needle, the paraspinal soft tissues were infiltrated with Bupivacaine 0.5%. The spinal needle was then advanced into the L5-S1 disc space. A fine-needle aspiration sample was then  collected and sent to laboratory for Gram stain culture. The needle was subsequently withdrawn. The access sites were cleaned and covered with a sterile bandage. IMPRESSION: Fluoroscopy-guided L5-S1 fine-needle aspiration for Gram stain and culture. Electronically Signed   By: Pedro Earls M.D.   On: 07/21/2020 14:41     Imaging independently reviewed in Epic.    Rayn[facility] [phone] pager 07/22/2020, 8:54 AM  I spent greater than 35 minutes with the patient including greater than 50% of time in face to face counsel of the patient and in coordination of their care.

## 2020-07-22 NOTE — Progress Notes (Signed)
Hemodialysis- Tolerated well. 5L removed without issue as ordered. Patient currently has no complaints. Report called to 17M.

## 2020-07-23 LAB — CULTURE, BLOOD (ROUTINE X 2)
Culture: NO GROWTH
Culture: NO GROWTH
Special Requests: ADEQUATE
Special Requests: ADEQUATE

## 2020-07-23 NOTE — TOC Transition Note (Signed)
Transition of care contact from inpatient facility  Date of discharge: 07/22/20 Date of contact: 07/23/20 Method: Phone Spoke to: Attempted: No answer  Attempted to call patient via phone to discuss transition of care from recent inpatient hospitalization; however, patient did not pick up the phone. Attempted to leave voicemail but mailbox was full.   Plan for renal team to follow-up with patient at his next HD treatment: [facility] on 07/24/20.  Tobie Poet, NP

## 2020-07-24 DIAGNOSIS — Z992 Dependence on renal dialysis: Secondary | ICD-10-CM | POA: Diagnosis not present

## 2020-07-24 DIAGNOSIS — N186 End stage renal disease: Secondary | ICD-10-CM | POA: Diagnosis not present

## 2020-07-24 DIAGNOSIS — N2581 Secondary hyperparathyroidism of renal origin: Secondary | ICD-10-CM | POA: Diagnosis not present

## 2020-07-24 LAB — BODY FLUID CULTURE W GRAM STAIN
Culture: NO GROWTH
Gram Stain: NONE SEEN

## 2020-07-26 DIAGNOSIS — N2581 Secondary hyperparathyroidism of renal origin: Secondary | ICD-10-CM | POA: Diagnosis not present

## 2020-07-26 DIAGNOSIS — N186 End stage renal disease: Secondary | ICD-10-CM | POA: Diagnosis not present

## 2020-07-26 DIAGNOSIS — Z992 Dependence on renal dialysis: Secondary | ICD-10-CM | POA: Diagnosis not present

## 2020-07-29 DIAGNOSIS — Z992 Dependence on renal dialysis: Secondary | ICD-10-CM | POA: Diagnosis not present

## 2020-07-29 DIAGNOSIS — N186 End stage renal disease: Secondary | ICD-10-CM | POA: Diagnosis not present

## 2020-07-29 DIAGNOSIS — N2581 Secondary hyperparathyroidism of renal origin: Secondary | ICD-10-CM | POA: Diagnosis not present

## 2020-08-02 ENCOUNTER — Emergency Department (HOSPITAL_COMMUNITY): Payer: Medicare HMO

## 2020-08-02 ENCOUNTER — Inpatient Hospital Stay (HOSPITAL_COMMUNITY)
Admission: EM | Admit: 2020-08-02 | Discharge: 2020-08-05 | DRG: 640 | Disposition: A | Discharge status: Home / Self Care | Payer: Medicare HMO | Attending: Internal Medicine | Admitting: Internal Medicine

## 2020-08-02 DIAGNOSIS — N186 End stage renal disease: Secondary | ICD-10-CM | POA: Diagnosis present

## 2020-08-02 DIAGNOSIS — I5043 Acute on chronic combined systolic (congestive) and diastolic (congestive) heart failure: Secondary | ICD-10-CM | POA: Diagnosis present

## 2020-08-02 DIAGNOSIS — Z9115 Patient's noncompliance with renal dialysis: Secondary | ICD-10-CM

## 2020-08-02 DIAGNOSIS — J811 Chronic pulmonary edema: Secondary | ICD-10-CM | POA: Diagnosis not present

## 2020-08-02 DIAGNOSIS — I160 Hypertensive urgency: Secondary | ICD-10-CM | POA: Diagnosis present

## 2020-08-02 DIAGNOSIS — Z823 Family history of stroke: Secondary | ICD-10-CM | POA: Diagnosis not present

## 2020-08-02 DIAGNOSIS — Z888 Allergy status to other drugs, medicaments and biological substances status: Secondary | ICD-10-CM | POA: Diagnosis not present

## 2020-08-02 DIAGNOSIS — Z992 Dependence on renal dialysis: Secondary | ICD-10-CM | POA: Diagnosis not present

## 2020-08-02 DIAGNOSIS — D631 Anemia in chronic kidney disease: Secondary | ICD-10-CM | POA: Diagnosis not present

## 2020-08-02 DIAGNOSIS — J449 Chronic obstructive pulmonary disease, unspecified: Secondary | ICD-10-CM | POA: Diagnosis present

## 2020-08-02 DIAGNOSIS — Z8042 Family history of malignant neoplasm of prostate: Secondary | ICD-10-CM | POA: Diagnosis not present

## 2020-08-02 DIAGNOSIS — I5032 Chronic diastolic (congestive) heart failure: Secondary | ICD-10-CM | POA: Diagnosis present

## 2020-08-02 DIAGNOSIS — R0902 Hypoxemia: Secondary | ICD-10-CM | POA: Diagnosis not present

## 2020-08-02 DIAGNOSIS — I10 Essential (primary) hypertension: Secondary | ICD-10-CM | POA: Diagnosis not present

## 2020-08-02 DIAGNOSIS — R778 Other specified abnormalities of plasma proteins: Secondary | ICD-10-CM | POA: Diagnosis present

## 2020-08-02 DIAGNOSIS — R110 Nausea: Secondary | ICD-10-CM | POA: Diagnosis not present

## 2020-08-02 DIAGNOSIS — I132 Hypertensive heart and chronic kidney disease with heart failure and with stage 5 chronic kidney disease, or end stage renal disease: Secondary | ICD-10-CM | POA: Diagnosis present

## 2020-08-02 DIAGNOSIS — Z79899 Other long term (current) drug therapy: Secondary | ICD-10-CM

## 2020-08-02 DIAGNOSIS — R0602 Shortness of breath: Secondary | ICD-10-CM | POA: Diagnosis not present

## 2020-08-02 DIAGNOSIS — I452 Bifascicular block: Secondary | ICD-10-CM | POA: Diagnosis present

## 2020-08-02 DIAGNOSIS — Z833 Family history of diabetes mellitus: Secondary | ICD-10-CM | POA: Diagnosis not present

## 2020-08-02 DIAGNOSIS — Z8249 Family history of ischemic heart disease and other diseases of the circulatory system: Secondary | ICD-10-CM

## 2020-08-02 DIAGNOSIS — J9601 Acute respiratory failure with hypoxia: Secondary | ICD-10-CM | POA: Diagnosis not present

## 2020-08-02 DIAGNOSIS — I517 Cardiomegaly: Secondary | ICD-10-CM | POA: Diagnosis not present

## 2020-08-02 DIAGNOSIS — Z87891 Personal history of nicotine dependence: Secondary | ICD-10-CM

## 2020-08-02 DIAGNOSIS — M898X9 Other specified disorders of bone, unspecified site: Secondary | ICD-10-CM | POA: Diagnosis present

## 2020-08-02 DIAGNOSIS — Z20822 Contact with and (suspected) exposure to covid-19: Secondary | ICD-10-CM | POA: Diagnosis present

## 2020-08-02 DIAGNOSIS — I5042 Chronic combined systolic (congestive) and diastolic (congestive) heart failure: Secondary | ICD-10-CM | POA: Diagnosis present

## 2020-08-02 DIAGNOSIS — I272 Pulmonary hypertension, unspecified: Secondary | ICD-10-CM | POA: Diagnosis not present

## 2020-08-02 DIAGNOSIS — E8770 Fluid overload, unspecified: Secondary | ICD-10-CM | POA: Diagnosis present

## 2020-08-02 DIAGNOSIS — I5033 Acute on chronic diastolic (congestive) heart failure: Secondary | ICD-10-CM | POA: Diagnosis present

## 2020-08-02 DIAGNOSIS — E875 Hyperkalemia: Principal | ICD-10-CM | POA: Diagnosis present

## 2020-08-02 DIAGNOSIS — E8779 Other fluid overload: Secondary | ICD-10-CM | POA: Diagnosis not present

## 2020-08-02 DIAGNOSIS — N250 Renal osteodystrophy: Secondary | ICD-10-CM | POA: Diagnosis not present

## 2020-08-02 DIAGNOSIS — R5381 Other malaise: Secondary | ICD-10-CM | POA: Diagnosis not present

## 2020-08-02 DIAGNOSIS — I120 Hypertensive chronic kidney disease with stage 5 chronic kidney disease or end stage renal disease: Secondary | ICD-10-CM | POA: Diagnosis not present

## 2020-08-02 LAB — BASIC METABOLIC PANEL
Anion gap: 19 — ABNORMAL HIGH (ref 5–15)
BUN: 147 mg/dL — ABNORMAL HIGH (ref 6–20)
CO2: 22 mmol/L (ref 22–32)
Calcium: 9.1 mg/dL (ref 8.9–10.3)
Chloride: 92 mmol/L — ABNORMAL LOW (ref 98–111)
Creatinine, Ser: 17.19 mg/dL — ABNORMAL HIGH (ref 0.61–1.24)
GFR, Estimated: 3 mL/min — ABNORMAL LOW (ref >60)
Glucose, Bld: 114 mg/dL — ABNORMAL HIGH (ref 70–99)
Potassium: >7.5 mmol/L (ref 3.5–5.1)
Sodium: 133 mmol/L — ABNORMAL LOW (ref 135–145)

## 2020-08-02 LAB — CBC WITH DIFFERENTIAL/PLATELET
Abs Immature Granulocytes: 0.01 10*3/uL (ref 0.00–0.07)
Basophils Absolute: 0 10*3/uL (ref 0.0–0.1)
Basophils Relative: 1 %
Eosinophils Absolute: 0.4 10*3/uL (ref 0.0–0.5)
Eosinophils Relative: 9 %
HCT: 31.7 % — ABNORMAL LOW (ref 39.0–52.0)
Hemoglobin: 10.8 g/dL — ABNORMAL LOW (ref 13.0–17.0)
Immature Granulocytes: 0 %
Lymphocytes Relative: 12 %
Lymphs Abs: 0.6 10*3/uL — ABNORMAL LOW (ref 0.7–4.0)
MCH: 30.9 pg (ref 26.0–34.0)
MCHC: 34.1 g/dL (ref 30.0–36.0)
MCV: 90.8 fL (ref 80.0–100.0)
Monocytes Absolute: 0.4 10*3/uL (ref 0.1–1.0)
Monocytes Relative: 8 %
Neutro Abs: 3.4 10*3/uL (ref 1.7–7.7)
Neutrophils Relative %: 70 %
Platelets: 244 10*3/uL (ref 150–400)
RBC: 3.49 MIL/uL — ABNORMAL LOW (ref 4.22–5.81)
RDW: 14.2 % (ref 11.5–15.5)
WBC: 4.8 10*3/uL (ref 4.0–10.5)
nRBC: 0 % (ref 0.0–0.2)

## 2020-08-02 LAB — BRAIN NATRIURETIC PEPTIDE: B Natriuretic Peptide: 1046.1 pg/mL — ABNORMAL HIGH (ref 0.0–100.0)

## 2020-08-02 LAB — RESP PANEL BY RT-PCR (FLU A&B, COVID) ARPGX2
Influenza A by PCR: NEGATIVE
Influenza B by PCR: NEGATIVE
SARS Coronavirus 2 by RT PCR: NEGATIVE

## 2020-08-02 LAB — TROPONIN I (HIGH SENSITIVITY): Troponin I (High Sensitivity): 70 ng/L — ABNORMAL HIGH (ref <18)

## 2020-08-02 IMAGING — DX DG CHEST 1V PORT
1 series · 1 of 1 positions shown · non-contrast
Comparison: [DATE]

CLINICAL DATA: Shortness breath

EXAM:
PORTABLE CHEST 1 VIEW

[chest]
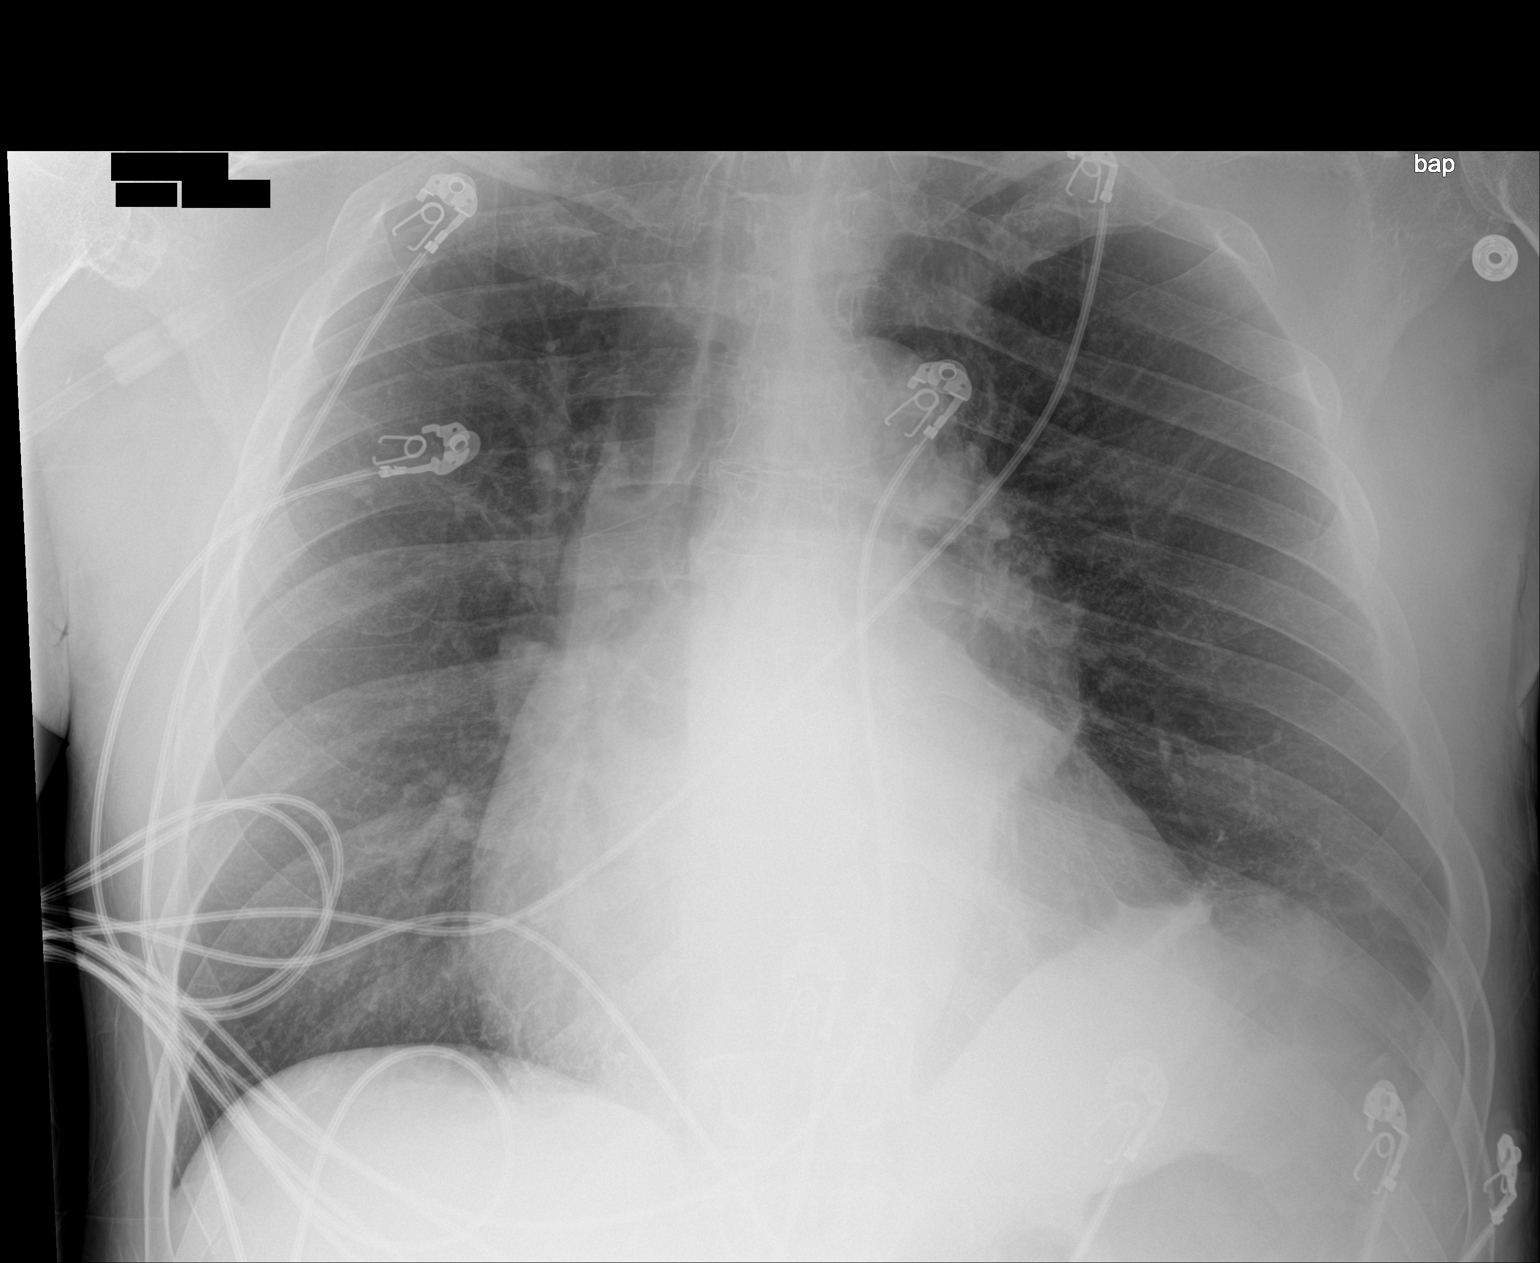

[1 of 1 positions shown; findings below may reference images not displayed]

FINDINGS: No focal consolidative process is seen. There is some fine
interstitial opacity and pulmonary vascular congestion which could
reflect early interstitial edematous change. Partial obscuration of
the left costophrenic sulcus, could reflect some trace layering
pleural effusion. Right costophrenic sulcus more well-defined. No
visible pneumothorax. Suspect enlarged cardiac silhouette though may
be accentuated by portable technique. Remaining cardiomediastinal
contours are similar to comparison prior. No acute or worrisome
abnormality of the chest wall. Telemetry leads and support devices
overlie the chest.
IMPRESSION: Mild pulmonary vascular congestion with some fine interstitial
opacity, could reflect early edema.

Cardiomegaly, similar to comparison prior.

## 2020-08-02 MED ORDER — CALCIUM GLUCONATE-NACL 1-0.675 GM/50ML-% IV SOLN
1.0000 g | Freq: Once | INTRAVENOUS | Status: AC
  Administered 2020-08-03: 1000 mg via INTRAVENOUS
  Filled 2020-08-02: qty 50

## 2020-08-02 MED ORDER — SODIUM ZIRCONIUM CYCLOSILICATE 10 G PO PACK
10.0000 g | PACK | Freq: Once | ORAL | Status: DC
Start: 2020-08-02 — End: 2020-08-04
  Filled 2020-08-02: qty 1

## 2020-08-02 MED ORDER — DEXTROSE 50 % IV SOLN
1.0000 | Freq: Once | INTRAVENOUS | Status: AC
  Administered 2020-08-03: 50 mL via INTRAVENOUS
  Filled 2020-08-02: qty 50

## 2020-08-02 MED ORDER — ALBUTEROL SULFATE (2.5 MG/3ML) 0.083% IN NEBU
10.0000 mg | INHALATION_SOLUTION | Freq: Once | RESPIRATORY_TRACT | Status: AC
  Administered 2020-08-03: 10 mg via RESPIRATORY_TRACT
  Filled 2020-08-02: qty 12

## 2020-08-02 MED ORDER — INSULIN ASPART 100 UNIT/ML IV SOLN
5.0000 [IU] | Freq: Once | INTRAVENOUS | Status: AC
  Administered 2020-08-03: 5 [IU] via INTRAVENOUS

## 2020-08-02 NOTE — ED Notes (Signed)
PA notified of critical potassium level

## 2020-08-02 NOTE — H&P (Signed)
Jason [name]. UMP:[id number] DOB: [birth year] DOA: 08/02/2020  PCP: [name], FNP (Inactive)   Outpatient Specialists:    Cardiology Dr. [name] NEphrology:    Dr. Hollie Salk    Patient arrived to ER on 08/02/20 at 2139 Referred by Attending Blanchie Dessert, MD   Patient coming from: home Lives alone,     Chief Complaint:   Chief Complaint  Patient presents with   Fatigue   Shortness of Breath    HPI: Jason [name]. is a 60 y.o. male with medical history significant of  ESRD on TTS dialysis, HTN, mod PAH, HFpEF (EF 50-55%, grade 1 DD in Feb 2021). COPD    Presented with  fatigue, chills, shortness of breath nausea Has not had HD since  Tuesday Last admission was 2 weeks  Hx of discitis/osteomyelitis showed an area at the L5/S1 suspect for discitis/osteomyelitis  Still makes urine no CP  Does not smoke now but used to no EtOH    Has  been vaccinated against COVID    Initial COVID TEST  NEGATIVE   Lab Results  Component Value Date   [place] 08/02/2020   [place] NEGATIVE 07/17/2020   [place] NEGATIVE 07/13/2020   [place] NEGATIVE 10/30/2019     Regarding pertinent Chronic problems:     HTN on NOrvasc, hydralazine, coreg     COPD - not  followed by pulmonology   not  on baseline oxygen    ESRD on HD T [facility] Sat  Lab Results  Component Value Date   CREATININE 17.19 (H) 08/02/2020   CREATININE 12.83 (H) 07/22/2020   CREATININE 9.06 (H) 07/20/2020     Chronic anemia - baseline hg Hemoglobin & Hematocrit  Recent Labs    07/19/20 0209 07/22/20 0629 08/02/20 2200  HGB 9.8* 10.9* 10.8*      While in ER:  K 7.5 CXR with edema Covid negative New O2 req 2l Cal, alb, insulin dex locelma given     ED Triage Vitals  Enc Vitals Group     BP 08/02/20 2147 (!) 178/90     Pulse Rate 08/02/20 2147 (!) 57     Resp 08/02/20 2147 16     Temp 08/02/20 2147 98 F (36.7 C)     Temp Source 08/02/20 2147 Oral     SpO2  08/02/20 2140 94 %     Weight 08/02/20 2148 168 lb (76.2 kg)     Height 08/02/20 2148 6\' 2"  (1.88 m)     Head Circumference --      Peak Flow --      Pain Score 08/02/20 2152 7     Pain Loc --      Pain Edu? --      Excl. in [place]? --   TMAX(24)@     _________________________________________ Significant initial  Findings: Abnormal Labs Reviewed  BASIC METABOLIC PANEL - Abnormal; Notable for the following components:      Result Value   Sodium 133 (*)    Potassium >7.5 (*)    Chloride 92 (*)    Glucose, Bld 114 (*)    BUN 147 (*)    Creatinine, Ser 17.19 (*)    GFR, Estimated 3 (*)    Anion gap 19 (*)    All other components within normal limits  CBC WITH DIFFERENTIAL/PLATELET - Abnormal; Notable for the following components:   RBC 3.49 (*)    Hemoglobin 10.8 (*)    HCT 31.7 (*)  Lymphs Abs 0.6 (*)    All other components within normal limits  BRAIN NATRIURETIC PEPTIDE - Abnormal; Notable for the following components:   B Natriuretic Peptide 1,046.1 (*)    All other components within normal limits  TROPONIN I (HIGH SENSITIVITY) - Abnormal; Notable for the following components:   Troponin I (High Sensitivity) 70 (*)    All other components within normal limits   ____________________________________________ Ordered    CXR - fluid overload    _________________________ Troponin  70 ECG: Ordered Personally reviewed by me showing: HR : 62 Rhythm:  NSR,  RBBB, LVH  nonspecific changes,    QTC 456 ______________   The recent clinical data is shown below. Vitals:   08/02/20 2140 08/02/20 2147 08/02/20 2148  BP:  (!) 178/90   Pulse:  (!) 57   Resp:  16   Temp:  98 F (36.7 C)   TempSrc:  Oral   SpO2: 94% 97%   Weight:   76.2 kg  Height:   6\' 2"  (1.88 m)    WBC     Component Value Date/Time   WBC 4.8 08/02/2020 2200   LYMPHSABS 0.6 (L) 08/02/2020 2200   LYMPHSABS 0.6 (L) 03/19/2019 1429   MONOABS 0.4 08/02/2020 2200   EOSABS 0.4 08/02/2020 2200   EOSABS  0.6 (H) 03/19/2019 1429   BASOSABS 0.0 08/02/2020 2200   BASOSABS 0.1 03/19/2019 1429      UA   not ordered   Results for orders placed or performed during the hospital encounter of 08/02/20  Resp Panel by RT-PCR (Flu A&B, Covid) Nasopharyngeal Swab     Status: None   Collection Time: 08/02/20 10:02 PM   Specimen: Nasopharyngeal Swab; Nasopharyngeal(NP) swabs in vial transport medium  Result Value Ref Range Status   SARS Coronavirus 2 by RT PCR NEGATIVE NEGATIVE Final         Influenza A by PCR NEGATIVE NEGATIVE Final   Influenza B by PCR NEGATIVE NEGATIVE Final          _______________________________________________________ ER Provider Called:  Nephrology   Dr.Schertz They Recommend admit to medicine    SEEN in ER _______________________________________________ Hospitalist was called for admission for hyperkalemia and fluid overload  The following Work up has been ordered so far:  Orders Placed This Encounter  Procedures   Resp Panel by RT-PCR (Flu A&B, Covid) Nasopharyngeal Swab   DG Chest Port 1 View   Basic metabolic panel   CBC with Differential   Brain natriuretic peptide   Place Patient on a Cardiac Monitor   Initiate Carrier Fluid Protocol   Consult to nephrology   Consult to hospitalist   Airborne and Contact precautions   EKG 12-Lead      Following Medications were ordered in ER: Medications  sodium zirconium cyclosilicate (LOKELMA) packet 10 g (has no administration in time range)  calcium gluconate 1 g/ 50 mL sodium chloride IVPB (has no administration in time range)  insulin aspart (novoLOG) injection 5 Units (has no administration in time range)    And  dextrose 50 % solution 50 mL (has no administration in time range)  albuterol (PROVENTIL) (2.5 MG/3ML) 0.083% nebulizer solution 10 mg (has no administration in time range)        Consult Orders  (From admission, onward)           Start     Ordered   08/02/20 2315  Consult to hospitalist   Paged  Once  Provider:  (Not yet assigned)  Question Answer Comment  Place call to: [facility]   Reason for Consult Admit      08/02/20 2314              OTHER Significant initial  Findings:  labs showing:    Recent Labs  Lab 08/02/20 2200  NA 133*  K >7.5*  CO2 22  GLUCOSE 114*  BUN 147*  CREATININE 17.19*  CALCIUM 9.1    Cr  * stable,  Up from baseline see below Lab Results  Component Value Date   CREATININE 17.19 (H) 08/02/2020   CREATININE 12.83 (H) 07/22/2020   CREATININE 9.06 (H) 07/20/2020    No results for input(s): AST, ALT, ALKPHOS, BILITOT, PROT, ALBUMIN in the last 168 hours. Lab Results  Component Value Date   CALCIUM 9.1 08/02/2020   PHOS 8.5 (H) 07/22/2020          Plt: Lab Results  Component Value Date   PLT 244 08/02/2020       COVID-19 Labs  No results for input(s): DDIMER, FERRITIN, LDH, CRP in the last 72 hours.  Lab Results  Component Value Date   SARSCOV2NAA NEGATIVE 08/02/2020   SARSCOV2NAA NEGATIVE 07/17/2020   SARSCOV2NAA NEGATIVE 07/13/2020   SARSCOV2NAA NEGATIVE 10/30/2019       Recent Labs  Lab 08/02/20 2200  WBC 4.8  NEUTROABS 3.4  HGB 10.8*  HCT 31.7*  MCV 90.8  PLT 244    HG/HCT   stable,      Component Value Date/Time   HGB 10.8 (L) 08/02/2020 2200   HGB 9.4 (L) 03/19/2019 1429   HCT 31.7 (L) 08/02/2020 2200   HCT 29.6 (L) 03/19/2019 1429   MCV 90.8 08/02/2020 2200   MCV 90 03/19/2019 1429        BNP (last 3 results) Recent Labs    07/17/20 1201 08/02/20 2200  BNP 3,654.1* 1,046.1*          Cultures:    Component Value Date/Time   SDES FLUID 07/21/2020 1357   SPECREQUEST L5 S1 DISC ASPIRATION SPEC A 07/21/2020 1357   CULT  07/21/2020 1357    NO GROWTH 3 DAYS Performed at [facility], [place] [address], [place], [place] [address]    REPTSTATUS 07/24/2020 FINAL 07/21/2020 1357     Radiological Exams on Admission: DG Chest Port 1 View  Result Date:  08/02/2020 CLINICAL DATA:  Shortness breath EXAM: PORTABLE CHEST 1 VIEW COMPARISON:  07/17/2020 FINDINGS: No focal consolidative process is seen. There is some fine interstitial opacity and pulmonary vascular congestion which could reflect early interstitial edematous change. Partial obscuration of the left costophrenic sulcus, could reflect some trace layering pleural effusion. Right costophrenic sulcus more well-defined. No visible pneumothorax. Suspect enlarged cardiac silhouette though may be accentuated by portable technique. Remaining cardiomediastinal contours are similar to comparison prior. No acute or worrisome abnormality of the chest wall. Telemetry leads and support devices overlie the chest. IMPRESSION: Mild pulmonary vascular congestion with some fine interstitial opacity, could reflect early edema. Cardiomegaly, similar to comparison prior. Electronically Signed   By: Lovena Le M.D.   On: 08/02/2020 22:26   _______________________________________________________________________________________________________ Latest  Blood pressure (!) 178/90, pulse (!) 57, temperature 98 F (36.7 C), temperature source Oral, resp. rate 16, height 6\' 2"  (1.88 m), weight 76.2 kg, SpO2 97 %.   Review of Systems:    Pertinent positives include:   chills, fatigue,  shortness of breath at rest.nausea, Bilateral lower  extremity swelling  Constitutional:  No weight loss, night sweats, Fevers,weight loss  HEENT:  No headaches, Difficulty swallowing,Tooth/dental problems,Sore throat,  No sneezing, itching, ear ache, nasal congestion, post nasal drip,  Cardio-vascular:  No chest pain, Orthopnea, PND, anasarca, dizziness, palpitations.no  GI:  No heartburn, indigestion, abdominal pain,  vomiting, diarrhea, change in bowel habits, loss of appetite, melena, blood in stool, hematemesis Resp:  no  No dyspnea on exertion, No excess mucus, no productive cough, No non-productive cough, No coughing up of blood.No  change in color of mucus.No wheezing. Skin:  no rash or lesions. No jaundice GU:  no dysuria, change in color of urine, no urgency or frequency. No straining to urinate.  No flank pain.  Musculoskeletal:  No joint pain or no joint swelling. No decreased range of motion. No back pain.  Psych:  No change in mood or affect. No depression or anxiety. No memory loss.  Neuro: no localizing neurological complaints, no tingling, no weakness, no double vision, no gait abnormality, no slurred speech, no confusion  All systems reviewed and apart from [place] all are negative _______________________________________________________________________________________________ Past Medical History:   Past Medical History:  Diagnosis Date   A-V fistula ([place])    Right arm   Abdominal hernia    AKI (acute kidney injury) ([place]) 01/2015   Anasarca    Anemia    Asthma    as a child   Bilateral inguinal hernia    Bilateral pleural effusion 01/2017   Cellulitis 01/16/2017   Bilateral lower extremity   CHF (congestive heart failure) (HCC)    Chronic venous insufficiency    Concentric left ventricular hypertrophy 08/17/2017   Moderated, noted on ECHO   Diastolic dysfunction 93/57/0177   noted on ECHO   Elevated LFTs    per patient, resolved   End stage renal disease ([place])    Dialysis T/Th/Sa  Started 02/2017/pt is waiting for a kidney transplant   H/O right inguinal hernia repair 11/2017   History of colonoscopy 03/20/2018   History of elevated PSA 10/2017   Hypertension    Pneumonia    Pulmonary hypertension ([place])    Moderate   Venous stasis ulcer ([place])    bil legs/ healed up now per pt (03/06/18)      Past Surgical History:  Procedure Laterality Date   AV FISTULA PLACEMENT Right 01/20/2017   Procedure: ARTERIOVENOUS (AV) FISTULA CREATION;  Surgeon: Angelia Mould, MD;  Location: [place];  Service: Vascular;  Laterality: Right;   HERNIA REPAIR     INSERTION OF DIALYSIS CATHETER Right  01/20/2017   Procedure: INSERTION OF DIALYSIS CATHETER;  Surgeon: Angelia Mould, MD;  Location: [place];  Service: Vascular;  Laterality: Right;   INSERTION OF MESH N/A 11/18/2017   Procedure: INSERTION OF MESH;  Surgeon: Michael Boston, MD;  Location: [facility];  Service: General;  Laterality: N/A;   IR FLUORO GUIDE CV LINE RIGHT  01/18/2017   IR LUMBAR DISC ASPIRATION W/IMG GUIDE  07/21/2020   IR US GUIDE VASC ACCESS RIGHT  01/18/2017   LAPAROSCOPIC INGUINAL HERNIA WITH UMBILICAL HERNIA Bilateral 11/18/2017   Procedure: LAPAROSCOPIC BILATERAL INGUINAL HERNIA REPAIR WITH UMBILICAL HERNIA REPAIR WITH MESH;  Surgeon: Michael Boston, MD;  Location: [facility];  Service: General;  Laterality: Bilateral;   TOE SURGERY     right fracture in big toe   TONSILLECTOMY      Social History:  Ambulatory   independently       reports that he  quit smoking about 25 years ago. He has never used smokeless tobacco. He reports previous alcohol use. He reports previous drug use.     Family History:   Family History  Problem Relation Age of Onset   Heart attack Mother 50       CABG hx   Heart disease Mother    Hypertension Mother    Stroke Mother    Diabetes Father    Healthy Brother    Prostate cancer Maternal Grandfather    Colon cancer Neg Hx    Colon polyps Neg Hx    Esophageal cancer Neg Hx    Stomach cancer Neg Hx    Rectal cancer Neg Hx    ______________________________________________________________________________________________ Allergies: Allergies  Allergen Reactions   Lisinopril Cough     Prior to Admission medications   Medication Sig Start Date End Date Taking? Authorizing Provider  acetaminophen (TYLENOL) 500 MG tablet Take 1,000 mg by mouth every 6 (six) hours as needed for moderate pain or headache.    [provider]  amLODipine (NORVASC) 10 MG tablet Take 10 mg by mouth every evening. 06/20/18   [provider]  carvedilol (COREG) 25 MG tablet Take 1 tablet  (25 mg total) by mouth 2 (two) times daily with a meal. 02/02/18   [name], FNP  doxercalciferol (HECTOROL) 0.5 MCG capsule Doxercalciferol (Hectorol) 02/21/20 02/19/21  [provider]  hydrALAZINE (APRESOLINE) 100 MG tablet Take 1 tablet (100 mg total) by mouth 3 (three) times daily. 11/03/19 07/17/20  Darliss Cheney, MD  HYDROcodone-acetaminophen (NORCO/VICODIN) 5-325 MG tablet Take 1 tablet by mouth every 6 (six) hours as needed for severe pain. 07/22/20   Caren Griffins, MD  iron sucrose in sodium chloride 0.9 % 100 mL Iron Sucrose (Venofer) 02/26/20 02/17/21  [provider]  methocarbamol (ROBAXIN) 500 MG tablet Take 1 tablet (500 mg total) by mouth every 8 (eight) hours as needed for muscle spasms. 07/22/20   Caren Griffins, MD  Methoxy PEG-Epoetin Beta (MIRCERA IJ) Mircera 02/12/20 02/10/21  [provider]  multivitamin (RENA-VIT) TABS tablet Take 1 tablet by mouth daily. 10/20/17   [provider]  polyvinyl alcohol (LIQUIFILM TEARS) 1.4 % ophthalmic solution Place 1 drop into both eyes as needed for dry eyes.    [provider]  sevelamer carbonate (RENVELA) 800 MG tablet Take 2,400-4,000 mg by mouth 3 (three) times daily. 01/15/20   [provider]    ___________________________________________________________________________________________________ Physical Exam: Vitals with BMI 08/02/2020 07/22/2020 07/22/2020  Height 6\' 2"  - -  Weight 168 lbs - 165 lbs 2 oz  BMI 65.78 - -  Systolic 469 629 528  Diastolic 90 97 99  Pulse 57 58 -     1. General:  in No  Acute distress    Chronically ill   -appearing 2. Psychological: Alert and   Oriented 3. Head/ENT:   Moist   Mucous Membranes                          Head Non traumatic, neck supple                    Poor Dentition 4. SKIN:  normal Skin turgor,  Skin clean Dry and intact no rash Left arm fistula in place 5. Heart: Regular rate and rhythm no  Murmur, no Rub or gallop 6.  Lungs:   no wheezes mild  crackles   7. Abdomen: Soft,  non-tender, Non distended  bowel sounds present 8. Lower extremities: no clubbing, cyanosis, trace edema 9. Neurologically Grossly intact, moving all 4 extremities equally  intact 10. MSK: Normal range of motion    Chart has been reviewed  ______________________________________________________________________________________________  Assessment/Plan  60 y.o. male with medical history significant of  ESRD on TTS dialysis, HTN, mod PAH, HFpEF (EF 50-55%, grade 1 DD in Feb 2021). COPD    Admitted for   hyperkalemia and fluid overload  Present on Admission:  Hyperkalemia -nephrology is aware have seen patient in consult plan to dialyze tonight   Volume overload -plan for hemodialysis tonight admit to progressive care until then   Hypertensive urgency in the setting of fluid overload.  Was dialyzed restart home medications and monitor blood pressures   Essential hypertension restart home medications when able to .  Elevated troponin level not due myocardial infarction no chest pain in the setting of end-stage renal disease   Acute on chronic combined systolic and diastolic congestive heart failure ([place]) -last echogram was in 2021 showed EF 62-13% grade 1 diastolic dysfunction.  Currently appears to have fluid overload in the setting of missing hemodialysis Nephrology will address   Acute respiratory failure with hypoxia (HCC) secondary to fluid overload in the setting of missing hemodialysis  Back pain during last admission had extensive work-up including MRI and IR guided lumbar disc aspiration on 11th of 7 blood cultures as well as aspiration was negative for any organisms.  ID felt patient was stable to monitor off of antibiotics.  Patient continues to have occasional back pain controlled with Norco he did report a bit of chills will repeat blood cultures given persistent back discomfort.  If positive will treat and need ID  consult Other plan as per orders.  COPD chronic stable continue albuterol.  Patient no longer smokes  DVT prophylaxis:  SCD    Code Status:    Code Status: Prior FULL CODE  as per patient   I had personally discussed CODE STATUS with patient   Family Communication:   Family not at  Bedside    Disposition Plan:      To home once workup is complete and patient is stable   Following barriers for discharge:                            Electrolytes corrected                                                         Will need consultants to evaluate patient prior to discharge                        Consults called: nephrology  Admission status:  ED Disposition     ED Disposition  [place]: [facility] [[id number]]  Level of Care: Progressive [102]  Admit to Progressive based on following criteria: NEPHROLOGY stable condition requiring close monitoring for AKI, requiring Hemodialysis or Peritoneal Dialysis either from expected electrolyte imbalance, acidosis, or fluid overload that can be managed by NIPPV or high flow oxygen.  May admit patient to [facility] or [facility] if equivalent level of care is available:: No  Covid Evaluation: Confirmed COVID Negative  Diagnosis: Hyperkalemia [[id number]]  Admitting Physician: Toy Baker [[id number]]  Attending Physician: Toy Baker [[id number]]  Estimated length of stay: past midnight tomorrow  Certification:: I certify this patient will need inpatient services for at least 2 midnights          inpatient     I Expect 2 midnight stay secondary to severity of patient's current illness need for inpatient interventions justified by the following:  Severe lab/radiological/exam abnormalities including:    Fluid overload and hyperkalemia and extensive comorbidities including:   COPD/asthmaESRD  That are currently affecting medical management.   I expect  patient to be hospitalized  for 2 midnights requiring inpatient medical care.  Patient is at high risk for adverse outcome (such as loss of life or disability) if not treated.  Indication for inpatient stay as follows:  Need for emergent HD New or worsening hypoxia  Need for IV antibiotics, IV fluids, IV rate controling medications, IV antihypertensives, IV pain medications, IV anticoagulation    Level of care progressive  tele indefinitely please discontinue once patient no longer qualifies COVID-19 Labs    Lab Results  Component Value Date   [place] 08/02/2020     Precautions: admitted as  Covid Negative      PPE: Used by the provider:   N95  eye Goggles,  Gloves    Nevae Pinnix 08/03/2020, 12:41 AM    [facility]     after 2 AM please page floor coverage PA If 7AM-7PM, please contact the day team taking care of the patient using [email or web address]   Patient was evaluated in the context of the global COVID-19 pandemic, which necessitated consideration that the patient might be at risk for infection with the SARS-CoV-2 virus that causes COVID-19. Institutional protocols and algorithms that pertain to the evaluation of patients at risk for COVID-19 are in a state of rapid change based on information released by regulatory bodies including the CDC and federal and state organizations. These policies and algorithms were followed during the patient's care.

## 2020-08-02 NOTE — ED Provider Notes (Signed)
[facility] EMERGENCY DEPARTMENT Provider Note   CSN: [id number] Arrival date & time: 08/02/20  2139     History Chief Complaint  Patient presents with   Fatigue   Shortness of Breath    Jason [name]. is a 60 y.o. male.  Jason [name]. is a 60 y.o. male with a history of ESRD on HD (T,Th,S), CHF, hypertension, who presents to the ED via EMS for evaluation of shortness of breath, malaise and fatigue.  Symptoms started on Thursday when he started to feel more fatigued and generally weak than usual.  Shortness of breath has been worsening over the past 3 days.  Today he has had some intermittent chest tightness.  Also reports a dry nonproductive cough.  No fevers at home.  Unsure of any sick contacts, has had 2 COVID vaccinations.  Has not been dialyzed since Tuesday because he missed dialysis today and Thursday due to the symptoms.  States his chest pain comes and goes is not worse with exertion and is not pleuritic in nature.  He is concerned that it may be related to anemia, has not had to be transfused previously.  When EMS arrived patient was hypoxic to 72%, improved with 4 L nasal cannula.  No other aggravating or alleviating factors.  The history is provided by the patient and medical records.      Past Medical History:  Diagnosis Date   A-V fistula ([place])    Right arm   Abdominal hernia    AKI (acute kidney injury) ([place]) 01/2015   Anasarca    Anemia    Asthma    as a child   Bilateral inguinal hernia    Bilateral pleural effusion 01/2017   Cellulitis 01/16/2017   Bilateral lower extremity   CHF (congestive heart failure) ([place])    Chronic venous insufficiency    Concentric left ventricular hypertrophy 08/17/2017   Moderated, noted on ECHO   Diastolic dysfunction 73/41/9379   noted on ECHO   Elevated LFTs    per patient, resolved   End stage renal disease ([place])    Dialysis T/Th/Sa  Started 02/2017/pt is waiting for a kidney transplant   H/O  right inguinal hernia repair 11/2017   History of colonoscopy 03/20/2018   History of elevated PSA 10/2017   Hypertension    Pneumonia    Pulmonary hypertension ([place])    Moderate   Venous stasis ulcer ([place])    bil legs/ healed up now per pt (03/06/18)    Patient Active Problem List   Diagnosis Date Noted   Acute hypoxemic respiratory failure ([place]) 07/19/2020   Hyperkalemia, diminished renal excretion 07/17/2020   Volume overload 07/17/2020   Acute encephalopathy 10/30/2019   Mediastinal mass 05/21/2019   Hyperkalemia 04/26/2019   Chronic combined systolic and diastolic congestive heart failure ([place]) 04/26/2019   Hypertensive urgency 02/17/2019   Hypertensive emergency 02/17/2019   Leg edema, left 02/17/2019   Bilateral impacted cerumen 08/24/2018   History of elevated PSA 08/24/2018   Hemodialysis patient ([place]) 05/28/2018   Vitamin D deficiency 05/28/2018   Left inguinal hernia s/p lap repair w mesh 11/18/2017 11/18/2017   Scrotal hernia, right, s/p lap repair w mesh 11/18/2017 05/30/2017   Arteriovenous fistula (Right arm) for dialysis 02/40/9735   Umbilical hernia s/p primary repair 11/18/2017 05/30/2017   Anasarca 02/18/2017   ESRD (end stage renal disease) on dialysis [facility])    NSVT (nonsustained ventricular tachycardia) ([place])    Evaluation by psychiatric  service required 01/19/2017   Elevated troponin level not due myocardial infarction 01/16/2017   Acute CHF (congestive heart failure) ([place]) 01/16/2017   Venous stasis ulcer ([place]) 01/16/2017   Essential hypertension 02/26/2015   Abnormal EKG 01/30/2015   Anemia of chronic disease 01/29/2015    Past Surgical History:  Procedure Laterality Date   AV FISTULA PLACEMENT Right 01/20/2017   Procedure: ARTERIOVENOUS (AV) FISTULA CREATION;  Surgeon: Angelia Mould, MD;  Location: [place];  Service: Vascular;  Laterality: Right;   HERNIA REPAIR     INSERTION OF DIALYSIS CATHETER Right 01/20/2017   Procedure: INSERTION OF  DIALYSIS CATHETER;  Surgeon: Angelia Mould, MD;  Location: [place];  Service: Vascular;  Laterality: Right;   INSERTION OF MESH N/A 11/18/2017   Procedure: INSERTION OF MESH;  Surgeon: Michael Boston, MD;  [facility];  Service: General;  Laterality: N/A;   IR FLUORO GUIDE CV LINE RIGHT  01/18/2017   IR LUMBAR DISC ASPIRATION W/IMG GUIDE  07/21/2020   IR US GUIDE VASC ACCESS RIGHT  01/18/2017   LAPAROSCOPIC INGUINAL HERNIA WITH UMBILICAL HERNIA Bilateral 11/18/2017   Procedure: LAPAROSCOPIC BILATERAL INGUINAL HERNIA REPAIR WITH UMBILICAL HERNIA REPAIR WITH MESH;  Surgeon: Michael Boston, MD;  [facility];  Service: General;  Laterality: Bilateral;   TOE SURGERY     right fracture in big toe   TONSILLECTOMY         Family History  Problem Relation Age of Onset   Heart attack Mother 76       CABG hx   Heart disease Mother    Hypertension Mother    Stroke Mother    Diabetes Father    Healthy Brother    Prostate cancer Maternal Grandfather    Colon cancer Neg Hx    Colon polyps Neg Hx    Esophageal cancer Neg Hx    Stomach cancer Neg Hx    Rectal cancer Neg Hx     Social History   Tobacco Use   Smoking status: Former    Years: 17.00    Types: Cigarettes    Quit date: 1997    Years since quitting: 25.5   Smokeless tobacco: Never   Tobacco comments:    quit smoking in 1997  Vaping Use   Vaping Use: Never used  Substance Use Topics   Alcohol use: Not Currently    Comment: every several months, rare   Drug use: Not Currently    Comment: marijuana  Hyperkalemia   Home Medications Prior to Admission medications   Medication Sig Start Date End Date Taking? Authorizing Provider  acetaminophen (TYLENOL) 500 MG tablet Take 1,000 mg by mouth every 6 (six) hours as needed for moderate pain or headache.    [provider]  amLODipine (NORVASC) 10 MG tablet Take 10 mg by mouth every evening. 06/20/18   [provider]  carvedilol (COREG) 25 MG tablet  Take 1 tablet (25 mg total) by mouth 2 (two) times daily with a meal. 02/02/18   [name], FNP  doxercalciferol (HECTOROL) 0.5 MCG capsule Doxercalciferol (Hectorol) 02/21/20 02/19/21  [provider]  hydrALAZINE (APRESOLINE) 100 MG tablet Take 1 tablet (100 mg total) by mouth 3 (three) times daily. 11/03/19 07/17/20  Darliss Cheney, MD  HYDROcodone-acetaminophen (NORCO/VICODIN) 5-325 MG tablet Take 1 tablet by mouth every 6 (six) hours as needed for severe pain. 07/22/20   Caren Griffins, MD  iron sucrose in sodium chloride 0.9 % 100 mL Iron Sucrose (  Venofer) 02/26/20 02/17/21  [provider]  methocarbamol (ROBAXIN) 500 MG tablet Take 1 tablet (500 mg total) by mouth every 8 (eight) hours as needed for muscle spasms. 07/22/20   Caren Griffins, MD  Methoxy PEG-Epoetin Beta (MIRCERA IJ) Mircera 02/12/20 02/10/21  [provider]  multivitamin (RENA-VIT) TABS tablet Take 1 tablet by mouth daily. 10/20/17   [provider]  polyvinyl alcohol (LIQUIFILM TEARS) 1.4 % ophthalmic solution Place 1 drop into both eyes as needed for dry eyes.    [provider]  sevelamer carbonate (RENVELA) 800 MG tablet Take 2,400-4,000 mg by mouth 3 (three) times daily. 01/15/20   [provider]    Allergies    Lisinopril  Review of Systems   Review of Systems  Constitutional:  Positive for fatigue. Negative for chills and fever.  HENT: Negative.    Respiratory:  Positive for cough, chest tightness and shortness of breath.   Cardiovascular:  Positive for chest pain. Negative for palpitations and leg swelling.  Gastrointestinal:  Positive for nausea. Negative for abdominal pain and vomiting.  Genitourinary:  Negative for dysuria and frequency.  Musculoskeletal:  Negative for arthralgias and myalgias.  Skin:  Negative for color change and rash.  Neurological:  Negative for dizziness, syncope and light-headedness.  All other systems reviewed and are  negative.  Physical Exam Updated Vital Signs BP (!) 178/90 (BP Location: Left Arm)   Pulse (!) 57   Temp 98 F (36.7 C) (Oral)   Resp 16   Ht 6\' 2"  (1.88 m)   Wt 76.2 kg   SpO2 97%   BMI 21.57 kg/m   Physical Exam Vitals and nursing note reviewed.  Constitutional:      General: He is not in acute distress.    Appearance: Normal appearance. He is well-developed. He is not ill-appearing or diaphoretic.     Comments: Well-appearing and in no distress   HENT:     Head: Normocephalic and atraumatic.  Eyes:     General:        Right eye: No discharge.        Left eye: No discharge.     Pupils: Pupils are equal, round, and reactive to light.  Cardiovascular:     Rate and Rhythm: Normal rate and regular rhythm.     Pulses: Normal pulses.     Heart sounds: Normal heart sounds.  Pulmonary:     Effort: Pulmonary effort is normal. No respiratory distress.     Breath sounds: Normal breath sounds. No wheezing or rales.     Comments: On 4 L nasal cannula patient breathing comfortably, satting at 97%, on room air patient quickly desats into the mid 80s.  Able to speak in full sentences on O2, on auscultation patient with decreased breath sounds in bilateral bases, no wheezes or rhonchi. Chest:     Chest wall: No tenderness.  Abdominal:     General: Bowel sounds are normal. There is no distension.     Palpations: Abdomen is soft. There is no mass.     Tenderness: There is no abdominal tenderness. There is no guarding.     Comments: Abdomen soft, nondistended, nontender to palpation in all quadrants without guarding or peritoneal signs  Musculoskeletal:        General: No deformity.     Cervical back: Neck supple.     Right lower leg: No edema.     Left lower leg: No edema.  Skin:    General:  Skin is warm and dry.     Capillary Refill: Capillary refill takes less than 2 seconds.  Neurological:     Mental Status: He is alert and oriented to person, place, and time.     Coordination:  Coordination normal.     Comments: Speech is clear, able to follow commands Moves extremities without ataxia, coordination intact  Psychiatric:        Mood and Affect: Mood normal.        Behavior: Behavior normal.    ED Results / Procedures / Treatments   Labs (all labs ordered are listed, but only abnormal results are displayed) Labs Reviewed  BASIC METABOLIC PANEL - Abnormal; Notable for the following components:      Result Value   Sodium 133 (*)    Potassium >7.5 (*)    Chloride 92 (*)    Glucose, Bld 114 (*)    BUN 147 (*)    Creatinine, Ser 17.19 (*)    GFR, Estimated 3 (*)    Anion gap 19 (*)    All other components within normal limits  CBC WITH DIFFERENTIAL/PLATELET - Abnormal; Notable for the following components:   RBC 3.49 (*)    Hemoglobin 10.8 (*)    HCT 31.7 (*)    Lymphs Abs 0.6 (*)    All other components within normal limits  BRAIN NATRIURETIC PEPTIDE - Abnormal; Notable for the following components:   B Natriuretic Peptide 1,046.1 (*)    All other components within normal limits  CBG MONITORING, ED - Abnormal; Notable for the following components:   Glucose-Capillary 111 (*)    All other components within normal limits  TROPONIN I (HIGH SENSITIVITY) - Abnormal; Notable for the following components:   Troponin I (High Sensitivity) 70 (*)    All other components within normal limits  RESP PANEL BY RT-PCR (FLU A&B, COVID) ARPGX2  PHOSPHORUS  MAGNESIUM  CK  HEPATIC FUNCTION PANEL  MAGNESIUM  PHOSPHORUS  CBC WITH DIFFERENTIAL/PLATELET  COMPREHENSIVE METABOLIC PANEL  TSH  TROPONIN I (HIGH SENSITIVITY)  TROPONIN I (HIGH SENSITIVITY)    EKG EKG Interpretation  Date/Time:  Saturday August 02 2020 21:44:29 EDT Ventricular Rate:  62 PR Interval:  181 QRS Duration: 117 QT Interval:  449 QTC Calculation: 456 R Axis:   -79 Text Interpretation: Sinus rhythm Incomplete RBBB and LAFB Probable left ventricular hypertrophy Anterior ST elevation, probably  due to LVH No significant change since last tracing Confirmed by Blanchie Dessert ([id number]) on 08/02/2020 9:49:58 PM  Radiology DG Chest Port 1 View  Result Date: 08/02/2020 CLINICAL DATA:  Shortness breath EXAM: PORTABLE CHEST 1 VIEW COMPARISON:  07/17/2020 FINDINGS: No focal consolidative process is seen. There is some fine interstitial opacity and pulmonary vascular congestion which could reflect early interstitial edematous change. Partial obscuration of the left costophrenic sulcus, could reflect some trace layering pleural effusion. Right costophrenic sulcus more well-defined. No visible pneumothorax. Suspect enlarged cardiac silhouette though may be accentuated by portable technique. Remaining cardiomediastinal contours are similar to comparison prior. No acute or worrisome abnormality of the chest wall. Telemetry leads and support devices overlie the chest. IMPRESSION: Mild pulmonary vascular congestion with some fine interstitial opacity, could reflect early edema. Cardiomegaly, similar to comparison prior. Electronically Signed   By: Lovena Le M.D.   On: 08/02/2020 22:26    Procedures .Critical Care  Date/Time: 08/02/2020 11:40 PM Performed by: Jacqlyn Larsen, PA-C Authorized by: Jacqlyn Larsen, PA-C   Critical care provider statement:  Critical care time (minutes):  45   Critical care time was exclusive of:  Separately billable procedures and treating other patients and teaching time   Critical care was necessary to treat or prevent imminent or life-threatening deterioration of the following conditions:  Metabolic crisis (Hyperkalemia)   Critical care was time spent personally by me on the following activities:  Discussions with consultants, evaluation of patient's response to treatment, examination of patient, ordering and performing treatments and interventions, ordering and review of laboratory studies, ordering and review of radiographic studies, pulse oximetry, re-evaluation of  patient's condition, obtaining history from patient or surrogate and review of old charts   Care discussed with: admitting provider     Medications Ordered in ED Medications  sodium zirconium cyclosilicate (LOKELMA) packet 10 g (has no administration in time range)  calcium gluconate 1 g/ 50 mL sodium chloride IVPB (has no administration in time range)  insulin aspart (novoLOG) injection 5 Units (has no administration in time range)    And  dextrose 50 % solution 50 mL (has no administration in time range)  amLODipine (NORVASC) tablet 10 mg (has no administration in time range)  carvedilol (COREG) tablet 25 mg (has no administration in time range)  hydrALAZINE (APRESOLINE) tablet 100 mg (has no administration in time range)  methocarbamol (ROBAXIN) tablet 500 mg (has no administration in time range)  multivitamin (RENA-VIT) tablet 1 tablet (has no administration in time range)  sevelamer carbonate (RENVELA) tablet 2,400-4,000 mg (has no administration in time range)  acetaminophen (TYLENOL) tablet 650 mg (has no administration in time range)    Or  acetaminophen (TYLENOL) suppository 650 mg (has no administration in time range)  HYDROcodone-acetaminophen (NORCO/VICODIN) 5-325 MG per tablet 1-2 tablet (has no administration in time range)  sodium chloride flush (NS) 0.9 % injection 3 mL (has no administration in time range)  sodium chloride flush (NS) 0.9 % injection 3 mL (has no administration in time range)  0.9 %  sodium chloride infusion (has no administration in time range)  albuterol (PROVENTIL) (2.5 MG/3ML) 0.083% nebulizer solution 2.5 mg (has no administration in time range)  Chlorhexidine Gluconate Cloth 2 % PADS 6 each (has no administration in time range)  albuterol (PROVENTIL) (2.5 MG/3ML) 0.083% nebulizer solution 10 mg (10 mg Nebulization Given 08/03/20 0033)    ED Course  I have reviewed the triage vital signs and the nursing notes.  Pertinent labs & imaging results that  were available during my care of the patient were reviewed by me and considered in my medical decision making (see chart for details).    MDM Rules/Calculators/A&P                           60 year old male presents with fatigue and shortness of breath as well as some chest tightness worsening over the last few days.  Was supposed to be dialyzed Thursday and today but missed both of the sessions due to his symptoms.  Last had dialysis on Tuesday 7/19.  He has had a nonproductive cough but no fevers or chills and no known sick contacts.  Patient is concerned his chronic anemia may be worsening, has not required transfusion previously.  When EMS arrived patient hypoxic to 72% on room air, improved on 4 L nasal cannula.  No home O2 requirement.  Patient has had a cough and generalized fatigue, no known sick contacts.  Will evaluate with chest x-ray, EKG, CBC, BMP, troponin, BNP and COVID  test.  Patient currently well-appearing on 4 L nasal cannula  I have independently ordered, reviewed and interpreted all labs and imaging: CBC: No leukocytosis, hemoglobin 10.8, at baseline BMP: Potassium greater than 7.5, creatinine 17.9 with BUN of 147 no other electrolyte derangements, anion gap of 19, likely in the setting of uremia. Troponin: Initial troponin elevated at 70, delta troponin pending, likely in the setting of ESRD, EKG without acute ischemic changes BNP: Elevated at 1046.1 COVID: Negative  Potassium is critically elevated, greater than 7.5 with no evidence of hemolysis.  Despite this EKG is very reassuring without peaked T waves or widening of the QRS, no PVCs noted.  CXR: Early signs of bibasilar edema likely in the setting of fluid overload from dialysis.  Will treat potassium aggressively with calcium gluconate, Lokelma, albuterol, insulin and dextrose.  Consult placed to nephrology for emergent dialysis.  Case discussed with Dr. Jonnie Finner with nephrology, he will see patient tonight and  arrange for dialysis, recommends medicine admission.  Case discussed with Dr. Roel Cluck with [facility] hospitalist who will see and admit the patient  Final Clinical Impression(s) / ED Diagnoses Final diagnoses:  Hyperkalemia  Acute hypoxemic respiratory failure ([place])  ESRD on hemodialysis [facility])    Rx / DC Orders ED Discharge Orders     None        Janet Berlin 08/03/20 6629    Blanchie Dessert, MD 08/03/20 2324

## 2020-08-02 NOTE — Consult Note (Signed)
Renal Service Consult Note [facility]  Jason [name] 08/02/2020 Jason Blazing, MD Requesting Physician: Dr. Roel Cluck  Reason for Consult: Hyperkalemia, ESRD HPI: The patient is a 60 y.o. year-old w/ hx of ESRD on HD, HTN, LVH recently admitted from 7/7 - 07/22/20 for volume overload, missed HD and back pain w/ possible L5/S1 discitis w/ negative cultures, dc'd on po abx to f/u w/ ID in clinic.  Pt presents w/ SOB after missed last 2 HD sessions, including today. K+ is > 7.5 and CXR shows early edema pattern. Asked to see for dialysis.   Pt seen in ED, main c/o is SOB, no chest pain, no fevers. Covid test is negative.    ROS - denies CP, no joint pain, no HA, no blurry vision, no rash, no diarrhea, no nausea/ vomiting   Past Medical History  Past Medical History:  Diagnosis Date   A-V fistula ([facility])    Right arm   Abdominal hernia    AKI (acute kidney injury) ([place]) 01/2015   Anasarca    Anemia    Asthma    as a child   Bilateral inguinal hernia    Bilateral pleural effusion 01/2017   Cellulitis 01/16/2017   Bilateral lower extremity   CHF (congestive heart failure) ([place])    Chronic venous insufficiency    Concentric left ventricular hypertrophy 08/17/2017   Moderated, noted on ECHO   Diastolic dysfunction 53/66/4403   noted on ECHO   Elevated LFTs    per patient, resolved   End stage renal disease ([place])    Dialysis T/Th/Sa  Started 02/2017/pt is waiting for a kidney transplant   H/O right inguinal hernia repair 11/2017   History of colonoscopy 03/20/2018   History of elevated PSA 10/2017   Hypertension    Pneumonia    Pulmonary hypertension ([place])    Moderate   Venous stasis ulcer ([place])    bil legs/ healed up now per pt (03/06/18)   Past Surgical History  Past Surgical History:  Procedure Laterality Date   AV FISTULA PLACEMENT Right 01/20/2017   Procedure: ARTERIOVENOUS (AV) FISTULA CREATION;  Surgeon: Angelia Mould, MD;  Location: [place];  Service: Vascular;  Laterality: Right;   HERNIA REPAIR     INSERTION OF DIALYSIS CATHETER Right 01/20/2017   Procedure: INSERTION OF DIALYSIS CATHETER;  Surgeon: Angelia Mould, MD;  Location: [place];  Service: Vascular;  Laterality: Right;   INSERTION OF MESH N/A 11/18/2017   Procedure: INSERTION OF MESH;  Surgeon: Michael Boston, MD;  [facility];  Service: General;  Laterality: N/A;   IR FLUORO GUIDE CV LINE RIGHT  01/18/2017   IR LUMBAR DISC ASPIRATION W/IMG GUIDE  07/21/2020   IR US GUIDE VASC ACCESS RIGHT  01/18/2017   LAPAROSCOPIC INGUINAL HERNIA WITH UMBILICAL HERNIA Bilateral 11/18/2017   Procedure: LAPAROSCOPIC BILATERAL INGUINAL HERNIA REPAIR WITH UMBILICAL HERNIA REPAIR WITH MESH;  Surgeon: Michael Boston, MD;  [facility];  Service: General;  Laterality: Bilateral;   TOE SURGERY     right fracture in big toe   TONSILLECTOMY     Family History  Family History  Problem Relation Age of Onset   Heart attack Mother 5       CABG hx   Heart disease Mother    Hypertension Mother    Stroke Mother    Diabetes Father    Healthy Brother    Prostate cancer Maternal Grandfather    Colon cancer Neg  Hx    Colon polyps Neg Hx    Esophageal cancer Neg Hx    Stomach cancer Neg Hx    Rectal cancer Neg Hx    Social History  reports that he quit smoking about 25 years ago. He has never used smokeless tobacco. He reports previous alcohol use. He reports previous drug use. Allergies  Allergies  Allergen Reactions   Lisinopril Cough   Home medications Prior to Admission medications   Medication Sig Start Date End Date Taking? Authorizing Provider  acetaminophen (TYLENOL) 500 MG tablet Take 1,000 mg by mouth every 6 (six) hours as needed for moderate pain or headache.    [provider]  amLODipine (NORVASC) 10 MG tablet Take 10 mg by mouth every evening. 06/20/18   [provider]  carvedilol (COREG) 25 MG tablet Take 1 tablet (25 mg total) by mouth 2 (two)  times daily with a meal. 02/02/18   [name], FNP  doxercalciferol (HECTOROL) 0.5 MCG capsule Doxercalciferol (Hectorol) 02/21/20 02/19/21  [provider]  hydrALAZINE (APRESOLINE) 100 MG tablet Take 1 tablet (100 mg total) by mouth 3 (three) times daily. 11/03/19 07/17/20  Darliss Cheney, MD  HYDROcodone-acetaminophen (NORCO/VICODIN) 5-325 MG tablet Take 1 tablet by mouth every 6 (six) hours as needed for severe pain. 07/22/20   Caren Griffins, MD  iron sucrose in sodium chloride 0.9 % 100 mL Iron Sucrose (Venofer) 02/26/20 02/17/21  [provider]  methocarbamol (ROBAXIN) 500 MG tablet Take 1 tablet (500 mg total) by mouth every 8 (eight) hours as needed for muscle spasms. 07/22/20   Caren Griffins, MD  Methoxy PEG-Epoetin Beta (MIRCERA IJ) Mircera 02/12/20 02/10/21  [provider]  multivitamin (RENA-VIT) TABS tablet Take 1 tablet by mouth daily. 10/20/17   [provider]  polyvinyl alcohol (LIQUIFILM TEARS) 1.4 % ophthalmic solution Place 1 drop into both eyes as needed for dry eyes.    [provider]  sevelamer carbonate (RENVELA) 800 MG tablet Take 2,400-4,000 mg by mouth 3 (three) times daily. 01/15/20   [provider]     Vitals:   08/02/20 2140 08/02/20 2147 08/02/20 2148  BP:  (!) 178/90   Pulse:  (!) 57   Resp:  16   Temp:  98 F (36.7 C)   TempSrc:  Oral   SpO2: 94% 97%   Weight:   76.2 kg  Height:   6\' 2"  (1.88 m)   Exam Gen alert, no distress, on 4L [place] O2 No rash, cyanosis or gangrene Sclera anicteric, throat clear  No jvd or bruits Chest mild bilat rales, no wheezing RRR no MRG Abd soft ntnd no mass or ascites +bs GU normal male MS no joint effusions or deformity Ext 1+ bilat pretib edema, no wounds or ulcers Neuro is alert, Ox 3 , nf RUA AVF         OP HD: [facility]   4h 45min   500/800    2/2 bath  AVF  Hep 6000   Assessment/ Plan: Severe hyperkalemia - K + > 7.5, no EKG changes. Has rec'd  temporizing measures per the ED.  Will plan HD this evening next available slot.  Acute hypoxic resp failure - w/ O2 requirement and early edema by CXR due to vol overload from missed HD mainly.  ESRD - usual HD TTS. Missed HD x 2.   Recent back pain/ possible L5/S1 discitis Anemia  CKD - Hb 10.8, follow MBD ckd - cont binder,  Ca is ok      Kelly Splinter  MD 08/02/2020, 11:40 PM  Recent Labs  Lab 08/02/20 2200  WBC 4.8  HGB 10.8*   Recent Labs  Lab 08/02/20 2200  K >7.5*  BUN 147*  CREATININE 17.19*  CALCIUM 9.1

## 2020-08-02 NOTE — ED Triage Notes (Addendum)
Pt BIB [facility]. Reports he started feeling bad last night - malaise, body aches, nausea, increased SOB. Pt is a dialysis pt (Tues., Thur., Sat.) but missed today and Thur. due to feeling bad. R. Arm fistula, Per [facility] pt was 72% on RA BP 210/72 HR 80 - Started on 4L [place] and O2 came up to 94% HR 69 BP 164/82.  Pt reports some dizziness and lightheaded.  Pt has hx of HTN controlled by meds.

## 2020-08-03 ENCOUNTER — Encounter (HOSPITAL_COMMUNITY): Payer: Self-pay | Admitting: Internal Medicine

## 2020-08-03 ENCOUNTER — Other Ambulatory Visit: Payer: Self-pay

## 2020-08-03 DIAGNOSIS — E875 Hyperkalemia: Principal | ICD-10-CM

## 2020-08-03 DIAGNOSIS — E8779 Other fluid overload: Secondary | ICD-10-CM

## 2020-08-03 DIAGNOSIS — N186 End stage renal disease: Secondary | ICD-10-CM

## 2020-08-03 DIAGNOSIS — I10 Essential (primary) hypertension: Secondary | ICD-10-CM

## 2020-08-03 DIAGNOSIS — I160 Hypertensive urgency: Secondary | ICD-10-CM

## 2020-08-03 DIAGNOSIS — J9601 Acute respiratory failure with hypoxia: Secondary | ICD-10-CM | POA: Diagnosis present

## 2020-08-03 DIAGNOSIS — Z992 Dependence on renal dialysis: Secondary | ICD-10-CM

## 2020-08-03 LAB — CBC WITH DIFFERENTIAL/PLATELET
Abs Immature Granulocytes: 0.01 10*3/uL (ref 0.00–0.07)
Basophils Absolute: 0 10*3/uL (ref 0.0–0.1)
Basophils Relative: 1 %
Eosinophils Absolute: 0.5 10*3/uL (ref 0.0–0.5)
Eosinophils Relative: 14 %
HCT: 30.8 % — ABNORMAL LOW (ref 39.0–52.0)
Hemoglobin: 10.7 g/dL — ABNORMAL LOW (ref 13.0–17.0)
Immature Granulocytes: 0 %
Lymphocytes Relative: 15 %
Lymphs Abs: 0.6 10*3/uL — ABNORMAL LOW (ref 0.7–4.0)
MCH: 31.2 pg (ref 26.0–34.0)
MCHC: 34.7 g/dL (ref 30.0–36.0)
MCV: 89.8 fL (ref 80.0–100.0)
Monocytes Absolute: 0.5 10*3/uL (ref 0.1–1.0)
Monocytes Relative: 13 %
Neutro Abs: 2.3 10*3/uL (ref 1.7–7.7)
Neutrophils Relative %: 57 %
Platelets: 205 10*3/uL (ref 150–400)
RBC: 3.43 MIL/uL — ABNORMAL LOW (ref 4.22–5.81)
RDW: 14.1 % (ref 11.5–15.5)
WBC: 3.9 10*3/uL — ABNORMAL LOW (ref 4.0–10.5)
nRBC: 0 % (ref 0.0–0.2)

## 2020-08-03 LAB — COMPREHENSIVE METABOLIC PANEL
ALT: 13 U/L (ref 0–44)
AST: 14 U/L — ABNORMAL LOW (ref 15–41)
Albumin: 3.7 g/dL (ref 3.5–5.0)
Alkaline Phosphatase: 41 U/L (ref 38–126)
Anion gap: 13 (ref 5–15)
BUN: 61 mg/dL — ABNORMAL HIGH (ref 6–20)
CO2: 26 mmol/L (ref 22–32)
Calcium: 9.3 mg/dL (ref 8.9–10.3)
Chloride: 99 mmol/L (ref 98–111)
Creatinine, Ser: 9.23 mg/dL — ABNORMAL HIGH (ref 0.61–1.24)
GFR, Estimated: 6 mL/min — ABNORMAL LOW (ref >60)
Glucose, Bld: 85 mg/dL (ref 70–99)
Potassium: 4.5 mmol/L (ref 3.5–5.1)
Sodium: 138 mmol/L (ref 135–145)
Total Bilirubin: 1 mg/dL (ref 0.3–1.2)
Total Protein: 7.1 g/dL (ref 6.5–8.1)

## 2020-08-03 LAB — HEPATIC FUNCTION PANEL
ALT: 13 U/L (ref 0–44)
AST: 68 U/L — ABNORMAL HIGH (ref 15–41)
Albumin: 3.3 g/dL — ABNORMAL LOW (ref 3.5–5.0)
Alkaline Phosphatase: 30 U/L — ABNORMAL LOW (ref 38–126)
Bilirubin, Direct: 0.6 mg/dL — ABNORMAL HIGH (ref 0.0–0.2)
Indirect Bilirubin: 0.2 mg/dL — ABNORMAL LOW (ref 0.3–0.9)
Total Bilirubin: 0.8 mg/dL (ref 0.3–1.2)
Total Protein: 6.2 g/dL — ABNORMAL LOW (ref 6.5–8.1)

## 2020-08-03 LAB — MAGNESIUM
Magnesium: 3.6 mg/dL — ABNORMAL HIGH (ref 1.7–2.4)
Magnesium: 2.6 mg/dL — ABNORMAL HIGH (ref 1.7–2.4)

## 2020-08-03 LAB — TROPONIN I (HIGH SENSITIVITY)
Troponin I (High Sensitivity): 68 ng/L — ABNORMAL HIGH (ref <18)
Troponin I (High Sensitivity): 76 ng/L — ABNORMAL HIGH (ref <18)

## 2020-08-03 LAB — CBG MONITORING, ED: Glucose-Capillary: 111 mg/dL — ABNORMAL HIGH (ref 70–99)

## 2020-08-03 LAB — CK: Total CK: 174 U/L (ref 49–397)

## 2020-08-03 LAB — PHOSPHORUS
Phosphorus: 11.6 mg/dL — ABNORMAL HIGH (ref 2.5–4.6)
Phosphorus: 6.3 mg/dL — ABNORMAL HIGH (ref 2.5–4.6)

## 2020-08-03 LAB — TSH: TSH: 1.901 u[IU]/mL (ref 0.350–4.500)

## 2020-08-03 MED ORDER — CARVEDILOL 25 MG PO TABS
25.0000 mg | ORAL_TABLET | Freq: Two times a day (BID) | ORAL | Status: DC
  Administered 2020-08-03 – 2020-08-05 (×5): 25 mg via ORAL
  Filled 2020-08-03 (×5): qty 1

## 2020-08-03 MED ORDER — RENA-VITE PO TABS
1.0000 | ORAL_TABLET | Freq: Every day | ORAL | Status: DC
  Administered 2020-08-03 – 2020-08-05 (×3): 1 via ORAL
  Filled 2020-08-03 (×4): qty 1

## 2020-08-03 MED ORDER — ACETAMINOPHEN 650 MG RE SUPP: 650.0000 mg | Freq: Four times a day (QID) | RECTAL | Status: DC | PRN

## 2020-08-03 MED ORDER — HEPARIN SODIUM (PORCINE) 5000 UNIT/ML IJ SOLN
5000.0000 [IU] | Freq: Three times a day (TID) | INTRAMUSCULAR | Status: DC
  Filled 2020-08-03: qty 1

## 2020-08-03 MED ORDER — SODIUM CHLORIDE 0.9% FLUSH
3.0000 mL | Freq: Two times a day (BID) | INTRAVENOUS | Status: DC
  Administered 2020-08-03 – 2020-08-04 (×3): 3 mL via INTRAVENOUS

## 2020-08-03 MED ORDER — SODIUM CHLORIDE 0.9 % IV SOLN: 250.0000 mL | INTRAVENOUS | Status: DC | PRN

## 2020-08-03 MED ORDER — ALBUTEROL SULFATE (2.5 MG/3ML) 0.083% IN NEBU: 2.5000 mg | INHALATION_SOLUTION | RESPIRATORY_TRACT | Status: DC | PRN

## 2020-08-03 MED ORDER — HYDRALAZINE HCL 25 MG PO TABS
100.0000 mg | ORAL_TABLET | Freq: Three times a day (TID) | ORAL | Status: DC
  Administered 2020-08-03 – 2020-08-05 (×7): 100 mg via ORAL
  Filled 2020-08-03 (×7): qty 4

## 2020-08-03 MED ORDER — SEVELAMER CARBONATE 800 MG PO TABS
2400.0000 mg | ORAL_TABLET | Freq: Three times a day (TID) | ORAL | Status: DC
  Administered 2020-08-03 – 2020-08-05 (×7): 2400 mg via ORAL
  Filled 2020-08-03 (×8): qty 3

## 2020-08-03 MED ORDER — ACETAMINOPHEN 325 MG PO TABS: 650.0000 mg | ORAL_TABLET | Freq: Four times a day (QID) | ORAL | Status: DC | PRN

## 2020-08-03 MED ORDER — METHOCARBAMOL 500 MG PO TABS
500.0000 mg | ORAL_TABLET | Freq: Three times a day (TID) | ORAL | Status: DC | PRN
  Administered 2020-08-05: 500 mg via ORAL
  Filled 2020-08-03: qty 1

## 2020-08-03 MED ORDER — CHLORHEXIDINE GLUCONATE CLOTH 2 % EX PADS
6.0000 | MEDICATED_PAD | Freq: Every day | CUTANEOUS | Status: DC
  Administered 2020-08-03 – 2020-08-04 (×2): 6 via TOPICAL

## 2020-08-03 MED ORDER — HYDROCODONE-ACETAMINOPHEN 5-325 MG PO TABS
1.0000 | ORAL_TABLET | ORAL | Status: DC | PRN
  Administered 2020-08-03 – 2020-08-05 (×5): 2 via ORAL
  Filled 2020-08-03 (×5): qty 2

## 2020-08-03 MED ORDER — AMLODIPINE BESYLATE 10 MG PO TABS
10.0000 mg | ORAL_TABLET | Freq: Every evening | ORAL | Status: DC
  Administered 2020-08-03 – 2020-08-04 (×2): 10 mg via ORAL
  Filled 2020-08-03 (×2): qty 1

## 2020-08-03 MED ORDER — SODIUM CHLORIDE 0.9% FLUSH: 3.0000 mL | INTRAVENOUS | Status: DC | PRN

## 2020-08-03 NOTE — ED Notes (Signed)
Pt to dialysis.

## 2020-08-03 NOTE — Evaluation (Addendum)
Physical Therapy Evaluation Patient Details Name: Jason [name]. MRN: [id number] DOB: [birth year] Today's Date: 08/03/2020   History of Present Illness  Pt is a 60 y.o. male who presented 08/02/20 with fatigue, chills, nausea, and SOB and had not had HD since 07/29/20. Pt admitted for hyperkalemia and fluid overload. Of note, pt recently discharged 07/22/20 s/p disc aspiration L5-S1. PMH: ESRD on TTS dialysis, HTN, mod PAH, HFpEF (EF 50-55%, grade 1 DD in Feb 2021), COPD   Clinical Impression  Pt presents with condition above and deficits mentioned below, see PT Problem List. PTA, he lived alone in a house with 3 STE and was independent, driving a cab for work. Currently, pt displays generalized lower extremity weakness, decreased aerobic endurance, and mild balance deficits (DGI score of 21 this date). Pt was able to maintain SpO2 at 92% throughout gait and stair negotiation until the end of the gait bout in which he desat to 87%, once sitting to rest it read 80% with him needing several minutes of pursed lip breathing to return to >/= 95% on RA. Pt able to mobilize at a min guard-supervision level at this time. All PT needs can be met acutely. Will continue to follow.    Follow Up Recommendations No PT follow up    Equipment Recommendations  None recommended by PT    Recommendations for Other Services       Precautions / Restrictions Precautions Precautions: Fall Precaution Comments: Back precautions for comfort; monitor SpO2 Restrictions Weight Bearing Restrictions: No      Mobility  Bed Mobility Overal bed mobility: Independent             General bed mobility comments: Pt able to perform bed mobility safely with bed flat.    Transfers Overall transfer level: Needs assistance   Transfers: Sit to/from Stand Sit to Stand: Min guard         General transfer comment: Min guard assist for safety.  Ambulation/Gait Ambulation/Gait assistance: Min  Gaffer (Feet): 250 Feet Assistive device: None Gait Pattern/deviations: Step-through pattern;Decreased stride length Gait velocity: WFL Gait velocity interpretation: >4.37 ft/sec, indicative of normal walking speed General Gait Details: Pt with decreased stride length and needs to slow his gait speed with changes in head position, but no overt LOB, min guard-supervision for safety. SpO2 92% majority of gait but towards end he desat to 87%, returned pt to sit to rest with it then dropping to 80% needing ~4 min to rebound to >/= 95% on RA  Stairs Stairs: Yes Stairs assistance: Min guard Stair Management: No rails;Alternating pattern;Step to pattern;Forwards Number of Stairs: 3 General stair comments: Ascends with reciprocal pattern and descends with step-to pattern, no LOB, min guard assist for safety. SpO2 remained 92% on RA  Wheelchair Mobility    Modified Rankin (Stroke Patients Only)       Balance Overall balance assessment: Mild deficits observed, not formally tested                               Standardized Balance Assessment Standardized Balance Assessment : Dynamic Gait Index   Dynamic Gait Index Level Surface: Normal Change in Gait Speed: Normal Gait with Horizontal Head Turns: Mild Impairment Gait with Vertical Head Turns: Mild Impairment Gait and Pivot Turn: Normal Step Over Obstacle: Normal Step Around Obstacles: Mild Impairment Steps: Normal Total Score: 21       Pertinent Vitals/Pain Pain Assessment: No/denies pain  Home Living Family/patient expects to be discharged to:: Private residence Living Arrangements: Alone Available Help at Discharge: Family;Available PRN/intermittently Type of Home: House Home Access: Stairs to enter Entrance Stairs-Rails: None Entrance Stairs-Number of Steps: 3 Home Layout: One level Home Equipment: None      Prior Function Level of Independence: Independent          Comments: Works as a Education officer, museum.     Hand Dominance   Dominant Hand: Right    Extremity/Trunk Assessment   Upper Extremity Assessment Upper Extremity Assessment: Defer to OT evaluation    Lower Extremity Assessment Lower Extremity Assessment: Generalized weakness (MMT scores of 4- bil hip flexion, 4+ bil knee extension, 5 bil ankle dorsiflexion)    Cervical / Trunk Assessment Cervical / Trunk Assessment: Other exceptions Cervical / Trunk Exceptions: hx of back pain, s/p disc aspiration L5-S1  Communication   Communication: No difficulties  Cognition Arousal/Alertness: Awake/alert Behavior During Therapy: WFL for tasks assessed/performed Overall Cognitive Status: Within Functional Limits for tasks assessed                                 General Comments: A&Ox4.      General Comments General comments (skin integrity, edema, etc.): SpO2 92% majority of gait but towards end he desat to 87%, returned pt to sit to rest with it then dropping to 80% needing ~4 min to rebound to >/= 95% on RA    Exercises     Assessment/Plan    PT Assessment Patient needs continued PT services  PT Problem List Decreased strength;Decreased activity tolerance;Decreased balance;Decreased mobility;Cardiopulmonary status limiting activity       PT Treatment Interventions DME instruction;Gait training;Stair training;Functional mobility training;Therapeutic activities;Balance training;Therapeutic exercise;Neuromuscular re-education;Patient/family education    PT Goals (Current goals can be found in the Care Plan section)  Acute Rehab PT Goals Patient Stated Goal: to get stronger PT Goal Formulation: With patient Time For Goal Achievement: 08/10/20 Potential to Achieve Goals: Good    Frequency Min 3X/week   Barriers to discharge        Co-evaluation               AM-PAC PT "6 Clicks" Mobility  Outcome Measure Help needed turning from your back to your side while  in a flat bed without using bedrails?: None Help needed moving from lying on your back to sitting on the side of a flat bed without using bedrails?: None Help needed moving to and from a bed to a chair (including a wheelchair)?: A Little Help needed standing up from a chair using your arms (e.g., wheelchair or bedside chair)?: A Little Help needed to walk in hospital room?: A Little Help needed climbing 3-5 steps with a railing? : A Little 6 Click Score: 20    End of Session Equipment Utilized During Treatment: Gait belt Activity Tolerance: Treatment limited secondary to medical complications (Comment) (SpO2 dropping) Patient left: in chair;with call bell/phone within reach;with chair alarm set Nurse Communication: Mobility status;Other (comment) (sats) PT Visit Diagnosis: Unsteadiness on feet (R26.81);Muscle weakness (generalized) (M62.81);Difficulty in walking, not elsewhere classified (R26.2)    Time: 5726-2035 PT Time Calculation (min) (ACUTE ONLY): 17 min   Charges:   PT Evaluation $PT Eval Moderate Complexity: 1 Mod          Moishe Spice, PT, DPT Acute Rehabilitation Services  Pager: [phone] Office: [phone]   Orvan Falconer 08/03/2020, 12:19 PM

## 2020-08-03 NOTE — Progress Notes (Signed)
PROGRESS NOTE  Jason [name]. QQP:[id number] DOB: 01-31-1960 DOA: 08/02/2020 PCP: [name], FNP (Inactive)  HPI/Recap of past 24 hours: Jason [name]. is a 60 y.o. male with medical history significant of  ESRD on TTS dialysis, HTN, mod PAH, HFpEF, COPD, presented with  fatigue, chills, shortness of breath nausea. Pt missed 2 HD sessions, still makes urine. Hx of L5/S1 discitis/osteomyelitis.  In the ED, noted to have hyperkalemia with potassium of greater than 7.5, chest x-ray with noted edema.  Patient admitted for emergency HD and further management.    Today, patient's denies any worsening shortness of breath, chest pain, abdominal pain, nausea/vomiting.  Reported generalized fatigue, denies any worsening back pain.    Assessment/Plan: Active Problems:   Essential hypertension   Elevated troponin level not due myocardial infarction   ESRD (end stage renal disease) on dialysis [facility])   Hypertensive urgency   Hyperkalemia   Chronic combined systolic and diastolic congestive heart failure (HCC)   Volume overload   Acute respiratory failure with hypoxia (HCC)   Fluid overload in ESRD patient Hyperkalemia Missed 2 sessions of HD PTA Nephrology consulted, had HD on 08/02/2020 TTS schedule Daily renal panel  Hypertensive crisis Likely 2/2 above Better control Continue amlodipine, Coreg, hydralazine  Elevated troponin Denies any chest pain Troponin with flat trend EKG with no acute ST changes Telemetry  Chronic diastolic HF Last echo on 7/12 showed EF of 50 to 45%, grade 1 diastolic dysfunction Fluid management with HD  Acute hypoxic respiratory failure Likely 2/2 fluid overload On admission, required 4 L nasal cannula, currently weaned to room air Management as above with HD  Hx of L5/S1 discitis/osteomyelitis Reports some back pain Had extensive work-up including MRI and IR guided lumbar disc aspiration Reported some chills, noted no fever or  leukocytosis BC x2 pending Continue pain management Monitor closely    Malnutrition Type:      Malnutrition Characteristics:      Nutrition Interventions:       Estimated body mass index is 21.76 kg/m as calculated from the following:   Height as of this encounter: 6\' 2"  (1.88 m).   Weight as of this encounter: 76.9 kg.     Code Status: Full  Family Communication: None at bedside  Disposition Plan: Status is: Inpatient  Remains inpatient appropriate because:Inpatient level of care appropriate due to severity of illness  Dispo: The patient is from: Home              Anticipated d/c is to: Home              Patient currently is not medically stable to d/c.   Difficult to place patient No    Consultants: Nephrology  Procedures: None  Antimicrobials: None  DVT prophylaxis: Heparin [place]   Objective: Vitals:   08/03/20 0643 08/03/20 0926 08/03/20 1225 08/03/20 1515  BP: (!) 177/96 (!) 186/103  (!) 157/89  Pulse: 60 64  (!) 56  Resp:  18  20  Temp: 98 F (36.7 C) 98.3 F (36.8 C)  98.5 F (36.9 C)  TempSrc: Oral Oral  Oral  SpO2: 94% 99% 96% 91%  Weight:      Height:        Intake/Output Summary (Last 24 hours) at 08/03/2020 1602 Last data filed at 08/03/2020 1549 Gross per 24 hour  Intake 350 ml  Output 4067 ml  Net -3717 ml   Filed Weights   08/03/20 0155 08/03/20 0525 08/03/20  0600  Weight: 81.2 kg 77.2 kg 76.9 kg    Exam: General: NAD  Cardiovascular: S1, S2 present Respiratory: CTAB Abdomen: Soft, nontender, nondistended, bowel sounds present Musculoskeletal: No bilateral pedal edema noted Skin: Normal Psychiatry: Normal mood     Data Reviewed: CBC: Recent Labs  Lab 08/02/20 2200 08/03/20 0654  WBC 4.8 3.9*  NEUTROABS 3.4 2.3  HGB 10.8* 10.7*  HCT 31.7* 30.8*  MCV 90.8 89.8  PLT 244 244   Basic Metabolic Panel: Recent Labs  Lab 08/02/20 2200 08/03/20 0040 08/03/20 0654  NA 133*  --  138  K >7.5*  --  4.5  CL  92*  --  99  CO2 22  --  26  GLUCOSE 114*  --  85  BUN 147*  --  61*  CREATININE 17.19*  --  9.23*  CALCIUM 9.1  --  9.3  MG  --  3.6* 2.6*  PHOS  --  11.6* 6.3*   GFR: Estimated Creatinine Clearance: 9.4 mL/min (A) (by C-G formula based on SCr of 9.23 mg/dL (H)). Liver Function Tests: Recent Labs  Lab 08/03/20 0040 08/03/20 0654  AST 68* 14*  ALT 13 13  ALKPHOS 30* 41  BILITOT 0.8 1.0  PROT 6.2* 7.1  ALBUMIN 3.3* 3.7   No results for input(s): LIPASE, AMYLASE in the last 168 hours. No results for input(s): AMMONIA in the last 168 hours. Coagulation Profile: No results for input(s): INR, PROTIME in the last 168 hours. Cardiac Enzymes: Recent Labs  Lab 08/03/20 0040  CKTOTAL 174   BNP (last 3 results) No results for input(s): PROBNP in the last 8760 hours. HbA1C: No results for input(s): HGBA1C in the last 72 hours. CBG: Recent Labs  Lab 08/02/20 2359  GLUCAP 111*   Lipid Profile: No results for input(s): CHOL, HDL, LDLCALC, TRIG, CHOLHDL, LDLDIRECT in the last 72 hours. Thyroid Function Tests: Recent Labs    08/03/20 0654  TSH 1.901   Anemia Panel: No results for input(s): VITAMINB12, FOLATE, FERRITIN, TIBC, IRON, RETICCTPCT in the last 72 hours. Urine analysis:    Component Value Date/Time   COLORURINE YELLOW 10/30/2019 2335   APPEARANCEUR HAZY (A) 10/30/2019 2335   LABSPEC 1.013 10/30/2019 2335   PHURINE 7.0 10/30/2019 2335   GLUCOSEU 50 (A) 10/30/2019 2335   HGBUR NEGATIVE 10/30/2019 2335   BILIRUBINUR NEGATIVE 10/30/2019 2335   BILIRUBINUR Negative 03/19/2019 1354   KETONESUR 5 (A) 10/30/2019 2335   PROTEINUR >=300 (A) 10/30/2019 2335   UROBILINOGEN 0.2 03/19/2019 1354   UROBILINOGEN 0.2 02/26/2015 1028   NITRITE NEGATIVE 10/30/2019 2335   LEUKOCYTESUR NEGATIVE 10/30/2019 2335   Sepsis Labs: @LABRCNTIP (procalcitonin:4,lacticidven:4)  ) Recent Results (from the past 240 hour(s))  Resp Panel by RT-PCR (Flu A&B, Covid) Nasopharyngeal Swab      Status: None   Collection Time: 08/02/20 10:02 PM   Specimen: Nasopharyngeal Swab; Nasopharyngeal(NP) swabs in vial transport medium  Result Value Ref Range Status   SARS Coronavirus 2 by RT PCR NEGATIVE NEGATIVE Final    Comment: (NOTE) SARS-CoV-2 target nucleic acids are NOT DETECTED.  The SARS-CoV-2 RNA is generally detectable in upper respiratory specimens during the acute phase of infection. The lowest concentration of SARS-CoV-2 viral copies this assay can detect is 138 copies/mL. A negative result does not preclude SARS-Cov-2 infection and should not be used as the sole basis for treatment or other patient management decisions. A negative result may occur with  improper specimen collection/handling, submission of specimen other than nasopharyngeal swab,  presence of viral mutation(s) within the areas targeted by this assay, and inadequate number of viral copies(<138 copies/mL). A negative result must be combined with clinical observations, patient history, and epidemiological information. The expected result is Negative.  Fact Sheet for Patients:  EntrepreneurPulse.com.au  Fact Sheet for Healthcare Providers:  IncredibleEmployment.be  This test is no t yet approved or cleared by the [place] FDA and  has been authorized for detection and/or diagnosis of SARS-CoV-2 by FDA under an Emergency Use Authorization (EUA). This EUA will remain  in effect (meaning this test can be used) for the duration of the COVID-19 declaration under Section 564(b)(1) of the Act, 21 U.S.C.section 360bbb-3(b)(1), unless the authorization is terminated  or revoked sooner.       Influenza A by PCR NEGATIVE NEGATIVE Final   Influenza B by PCR NEGATIVE NEGATIVE Final    Comment: (NOTE) The Xpert Xpress SARS-CoV-2/FLU/RSV plus assay is intended as an aid in the diagnosis of influenza from Nasopharyngeal swab specimens and should not be used as a sole basis  for treatment. Nasal washings and aspirates are unacceptable for Xpert Xpress SARS-CoV-2/FLU/RSV testing.  Fact Sheet for Patients: EntrepreneurPulse.com.au  Fact Sheet for Healthcare Providers: IncredibleEmployment.be  This test is not yet approved or cleared by the [place] FDA and has been authorized for detection and/or diagnosis of SARS-CoV-2 by FDA under an Emergency Use Authorization (EUA). This EUA will remain in effect (meaning this test can be used) for the duration of the COVID-19 declaration under Section 564(b)(1) of the Act, 21 U.S.C. section 360bbb-3(b)(1), unless the authorization is terminated or revoked.  Performed at [facility], [place] [address] [place], [place] [address]       Studies: DG Chest Port 1 View  Result Date: 08/02/2020 CLINICAL DATA:  Shortness breath EXAM: PORTABLE CHEST 1 VIEW COMPARISON:  07/17/2020 FINDINGS: No focal consolidative process is seen. There is some fine interstitial opacity and pulmonary vascular congestion which could reflect early interstitial edematous change. Partial obscuration of the left costophrenic sulcus, could reflect some trace layering pleural effusion. Right costophrenic sulcus more well-defined. No visible pneumothorax. Suspect enlarged cardiac silhouette though may be accentuated by portable technique. Remaining cardiomediastinal contours are similar to comparison prior. No acute or worrisome abnormality of the chest wall. Telemetry leads and support devices overlie the chest. IMPRESSION: Mild pulmonary vascular congestion with some fine interstitial opacity, could reflect early edema. Cardiomegaly, similar to comparison prior. Electronically Signed   By: Lovena Le M.D.   On: 08/02/2020 22:26    Scheduled Meds:  amLODipine  10 mg Oral QPM   carvedilol  25 mg Oral BID WC   Chlorhexidine Gluconate Cloth  6 each Topical Q0600   hydrALAZINE  100 mg Oral TID   multivitamin   1 tablet Oral Daily   sevelamer carbonate  2,400-4,000 mg Oral TID   sodium chloride flush  3 mL Intravenous Q12H   sodium zirconium cyclosilicate  10 g Oral Once    Continuous Infusions:  sodium chloride       LOS: 1 day     Alma Friendly, MD [facility]  If 7PM-7AM, please contact night-coverage [email or web address] 08/03/2020, 4:02 PM

## 2020-08-03 NOTE — ED Notes (Signed)
This RN called dialysis to let them know that pt has a bed and to take him to the floor when pt is finished with dialysis - This RN also gave report to [facility]

## 2020-08-03 NOTE — Evaluation (Signed)
Occupational Therapy Evaluation Patient Details Name: Jason [name]. MRN: [id number] DOB: 1960-08-17 Today's Date: 08/03/2020    History of Present Illness Pt is a 60 y.o. male who presented 08/02/20 with fatigue, chills, nausea, and SOB and had not had HD since 07/29/20. Pt admitted for hyperkalemia and fluid overload. Of note, pt recently discharged 07/22/20 s/p disc aspiration L5-S1. PMH: ESRD on TTS dialysis, HTN, mod PAH, HFpEF (EF 50-55%, grade 1 DD in Feb 2021), COPD   Clinical Impression   Pt admitted for concerns listed above. PTA pt reported independence with all ADL's and IADL's, including working and driving. At the time of the evaluation. Pt continued to demonstrate independence with all ADL's and functional mobility. Pt has no OT needs at this time and acute OT will sign off.     Follow Up Recommendations  No OT follow up    Equipment Recommendations  None recommended by OT    Recommendations for Other Services       Precautions / Restrictions Precautions Precautions: Fall Precaution Comments: Back precautions for comfort; monitor SpO2 Restrictions Weight Bearing Restrictions: No      Mobility Bed Mobility Overal bed mobility: Independent             General bed mobility comments: Pt able to perform bed mobility safely with bed flat.    Transfers Overall transfer level: Needs assistance   Transfers: Sit to/from Stand Sit to Stand: Supervision         General transfer comment: Sup for safety.    Balance Overall balance assessment: Mild deficits observed, not formally tested                                         ADL either performed or assessed with clinical judgement   ADL Overall ADL's : At baseline;Independent                                       General ADL Comments: No concerns with mobility and ADL's. Pt has good ROM and MMT. Simulated dressing, toileting, and bathing with no concerns     Vision  Baseline Vision/History: No visual deficits Patient Visual Report: No change from baseline Vision Assessment?: No apparent visual deficits     Perception Perception Perception Tested?: No   Praxis Praxis Praxis tested?: Not tested    Pertinent Vitals/Pain Pain Assessment: No/denies pain     Hand Dominance Right   Extremity/Trunk Assessment Upper Extremity Assessment Upper Extremity Assessment: Overall WFL for tasks assessed   Lower Extremity Assessment Lower Extremity Assessment: Defer to PT evaluation   Cervical / Trunk Assessment Cervical / Trunk Assessment: Other exceptions Cervical / Trunk Exceptions: hx of back pain, s/p disc aspiration L5-S1   Communication Communication Communication: No difficulties   Cognition Arousal/Alertness: Awake/alert Behavior During Therapy: WFL for tasks assessed/performed Overall Cognitive Status: Within Functional Limits for tasks assessed                                 General Comments: A&Ox4.   General Comments  VSS on Ra    Exercises     Shoulder Instructions      Home Living Family/patient expects to be discharged to:: Private residence Living Arrangements: Alone Available  Help at Discharge: Family;Available PRN/intermittently Type of Home: House Home Access: Stairs to enter [employer] of Steps: 3 Entrance Stairs-Rails: None Home Layout: One level     Bathroom Shower/Tub: Teacher, early years/pre: Standard     Home Equipment: None          Prior Functioning/Environment Level of Independence: Independent        Comments: Works as a Education officer, museum.        OT Problem List: Decreased strength;Decreased activity tolerance      OT Treatment/Interventions:      OT Goals(Current goals can be found in the care plan section) Acute Rehab OT Goals Patient Stated Goal: to get stronger OT Goal Formulation: All assessment and education complete, DC therapy Time For Goal  Achievement: 08/03/20 Potential to Achieve Goals: Good  OT Frequency:     Barriers to D/C:            Co-evaluation              AM-PAC OT "6 Clicks" Daily Activity     Outcome Measure Help from another person eating meals?: None Help from another person taking care of personal grooming?: None Help from another person toileting, which includes using toliet, bedpan, or urinal?: None Help from another person bathing (including washing, rinsing, drying)?: None Help from another person to put on and taking off regular upper body clothing?: None Help from another person to put on and taking off regular lower body clothing?: None 6 Click Score: 24   End of Session Nurse Communication: Mobility status  Activity Tolerance: Patient tolerated treatment well Patient left: in bed;with call bell/phone within reach  OT Visit Diagnosis: Unsteadiness on feet (R26.81);Muscle weakness (generalized) (M62.81)                Time: 1683-7290 OT Time Calculation (min): 12 min Charges:  OT General Charges $OT Visit: 1 Visit OT Evaluation $OT Eval Low Complexity: [place]., OTR/L Acute Rehabilitation  Keriana Sarsfield Elane Yolanda Bonine 08/03/2020, 6:05 PM

## 2020-08-04 LAB — CBC WITH DIFFERENTIAL/PLATELET
Abs Immature Granulocytes: 0.01 10*3/uL (ref 0.00–0.07)
Basophils Absolute: 0 10*3/uL (ref 0.0–0.1)
Basophils Relative: 1 %
Eosinophils Absolute: 0.8 10*3/uL — ABNORMAL HIGH (ref 0.0–0.5)
Eosinophils Relative: 18 %
HCT: 30.3 % — ABNORMAL LOW (ref 39.0–52.0)
Hemoglobin: 9.9 g/dL — ABNORMAL LOW (ref 13.0–17.0)
Immature Granulocytes: 0 %
Lymphocytes Relative: 12 %
Lymphs Abs: 0.5 10*3/uL — ABNORMAL LOW (ref 0.7–4.0)
MCH: 30.4 pg (ref 26.0–34.0)
MCHC: 32.7 g/dL (ref 30.0–36.0)
MCV: 92.9 fL (ref 80.0–100.0)
Monocytes Absolute: 0.5 10*3/uL (ref 0.1–1.0)
Monocytes Relative: 11 %
Neutro Abs: 2.6 10*3/uL (ref 1.7–7.7)
Neutrophils Relative %: 58 %
Platelets: 195 10*3/uL (ref 150–400)
RBC: 3.26 MIL/uL — ABNORMAL LOW (ref 4.22–5.81)
RDW: 13.8 % (ref 11.5–15.5)
WBC: 4.5 10*3/uL (ref 4.0–10.5)
nRBC: 0 % (ref 0.0–0.2)

## 2020-08-04 LAB — RENAL FUNCTION PANEL
Albumin: 3.1 g/dL — ABNORMAL LOW (ref 3.5–5.0)
Anion gap: 11 (ref 5–15)
BUN: 83 mg/dL — ABNORMAL HIGH (ref 6–20)
CO2: 28 mmol/L (ref 22–32)
Calcium: 8.5 mg/dL — ABNORMAL LOW (ref 8.9–10.3)
Chloride: 97 mmol/L — ABNORMAL LOW (ref 98–111)
Creatinine, Ser: 12.21 mg/dL — ABNORMAL HIGH (ref 0.61–1.24)
GFR, Estimated: 4 mL/min — ABNORMAL LOW (ref >60)
Glucose, Bld: 117 mg/dL — ABNORMAL HIGH (ref 70–99)
Phosphorus: 10.3 mg/dL — ABNORMAL HIGH (ref 2.5–4.6)
Potassium: 6.1 mmol/L — ABNORMAL HIGH (ref 3.5–5.1)
Sodium: 136 mmol/L (ref 135–145)

## 2020-08-04 LAB — POTASSIUM
Potassium: 6.3 mmol/L (ref 3.5–5.1)
Potassium: 6.4 mmol/L (ref 3.5–5.1)

## 2020-08-04 MED ORDER — SODIUM ZIRCONIUM CYCLOSILICATE 10 G PO PACK
10.0000 g | PACK | Freq: Two times a day (BID) | ORAL | Status: DC
  Administered 2020-08-04: 10 g via ORAL
  Filled 2020-08-04: qty 1

## 2020-08-04 MED ORDER — CHLORHEXIDINE GLUCONATE CLOTH 2 % EX PADS: 6.0000 | MEDICATED_PAD | Freq: Every day | CUTANEOUS | Status: DC

## 2020-08-04 MED ORDER — SODIUM ZIRCONIUM CYCLOSILICATE 10 G PO PACK: 10.0000 g | PACK | Freq: Three times a day (TID) | ORAL | Status: DC

## 2020-08-04 MED ORDER — SODIUM ZIRCONIUM CYCLOSILICATE 10 G PO PACK
10.0000 g | PACK | Freq: Three times a day (TID) | ORAL | Status: AC
  Administered 2020-08-04 – 2020-08-05 (×3): 10 g via ORAL
  Filled 2020-08-04 (×3): qty 1

## 2020-08-04 NOTE — Progress Notes (Signed)
PROGRESS NOTE  Jason [name]. NWG:[id number] DOB: [birth year] DOA: 08/02/2020 PCP: [name], FNP (Inactive)  HPI/Recap of past 24 hours: Jason [name]. is a 60 y.o. male with medical history significant of  ESRD on TTS dialysis, HTN, mod PAH, HFpEF, COPD, presented with  fatigue, chills, shortness of breath nausea. Pt missed 2 HD sessions, still makes urine. Hx of L5/S1 discitis/osteomyelitis.  In the ED, noted to have hyperkalemia with potassium of greater than 7.5, chest x-ray with noted edema.  Patient admitted for emergency HD and further management.     Overnight patient's was placed on O2 complaining of orthopnea.  Today, patient complaining of some shortness of breath, denies any chest pain.  Noted hyperkalemia    Assessment/Plan: Active Problems:   Essential hypertension   Elevated troponin level not due myocardial infarction   ESRD (end stage renal disease) on dialysis [facility])   Hypertensive urgency   Hyperkalemia   Chronic combined systolic and diastolic congestive heart failure (HCC)   Volume overload   Acute respiratory failure with hypoxia (HCC)   Fluid overload in ESRD patient (TTS schedule) Hyperkalemia Missed 2 sessions of HD PTA Nephrology consulted, had HD on 08/02/2020 Spoke to Dr Royce Macadamia Nephrology 08/04/20 about rising K+, agree with lokelma TID and for possible HD tonight Frequent potassium checks Daily renal panel  Hypertensive crisis Likely 2/2 above Better control Continue amlodipine, Coreg, hydralazine  Elevated troponin Denies any chest pain Troponin with flat trend EKG with no acute ST changes Telemetry  Chronic diastolic HF Last echo on 5/78 showed EF of 50 to 46%, grade 1 diastolic dysfunction Fluid management with HD  Acute hypoxic respiratory failure Likely 2/2 fluid overload On admission, required 4 L nasal cannula, currently weaning to room air Management as above with HD  Hx of L5/S1 discitis/osteomyelitis Reports  some back pain Had extensive work-up including MRI and IR guided lumbar disc aspiration Reported some chills, noted no fever or leukocytosis BC x2 NGTD Continue pain management Monitor closely    Malnutrition Type:      Malnutrition Characteristics:      Nutrition Interventions:       Estimated body mass index is 22.28 kg/m as calculated from the following:   Height as of this encounter: 6\' 2"  (1.88 m).   Weight as of this encounter: 78.7 kg.     Code Status: Full  Family Communication: None at bedside  Disposition Plan: Status is: Inpatient  Remains inpatient appropriate because:Inpatient level of care appropriate due to severity of illness  Dispo: The patient is from: Home              Anticipated d/c is to: Home              Patient currently is not medically stable to d/c.   Difficult to place patient No    Consultants: Nephrology  Procedures: None  Antimicrobials: None  DVT prophylaxis: Heparin [place]   Objective: Vitals:   08/04/20 1111 08/04/20 1525 08/04/20 1632 08/04/20 1721  BP: (!) 160/95 (!) 171/81 (!) 176/84 (!) 151/91  Pulse: 66 (!) 56  (!) 57  Resp: 20 19    Temp: 98.3 F (36.8 C) 97.8 F (36.6 C)    TempSrc: Oral Oral    SpO2: 94% 97%    Weight:      Height:        Intake/Output Summary (Last 24 hours) at 08/04/2020 1735 Last data filed at 08/04/2020 1235 Gross per 24  hour  Intake 1174 ml  Output 100 ml  Net 1074 ml   Filed Weights   08/03/20 0525 08/03/20 0600 08/04/20 0047  Weight: 77.2 kg 76.9 kg 78.7 kg    Exam: General: NAD  Cardiovascular: S1, S2 present Respiratory: CTAB Abdomen: Soft, nontender, nondistended, bowel sounds present Musculoskeletal: No bilateral pedal edema noted Skin: Normal Psychiatry: Normal mood     Data Reviewed: CBC: Recent Labs  Lab 08/02/20 2200 08/03/20 0654 08/04/20 0759  WBC 4.8 3.9* 4.5  NEUTROABS 3.4 2.3 2.6  HGB 10.8* 10.7* 9.9*  HCT 31.7* 30.8* 30.3*  MCV 90.8 89.8  92.9  PLT 244 205 716   Basic Metabolic Panel: Recent Labs  Lab 08/02/20 2200 08/03/20 0040 08/03/20 0654 08/04/20 0759 08/04/20 1502  NA 133*  --  138 136  --   K >7.5*  --  4.5 6.1* 6.3*  CL 92*  --  99 97*  --   CO2 22  --  26 28  --   GLUCOSE 114*  --  85 117*  --   BUN 147*  --  61* 83*  --   CREATININE 17.19*  --  9.23* 12.21*  --   CALCIUM 9.1  --  9.3 8.5*  --   MG  --  3.6* 2.6*  --   --   PHOS  --  11.6* 6.3* 10.3*  --    GFR: Estimated Creatinine Clearance: 7.3 mL/min (A) (by C-G formula based on SCr of 12.21 mg/dL (H)). Liver Function Tests: Recent Labs  Lab 08/03/20 0040 08/03/20 0654 08/04/20 0759  AST 68* 14*  --   ALT 13 13  --   ALKPHOS 30* 41  --   BILITOT 0.8 1.0  --   PROT 6.2* 7.1  --   ALBUMIN 3.3* 3.7 3.1*   No results for input(s): LIPASE, AMYLASE in the last 168 hours. No results for input(s): AMMONIA in the last 168 hours. Coagulation Profile: No results for input(s): INR, PROTIME in the last 168 hours. Cardiac Enzymes: Recent Labs  Lab 08/03/20 0040  CKTOTAL 174   BNP (last 3 results) No results for input(s): PROBNP in the last 8760 hours. HbA1C: No results for input(s): HGBA1C in the last 72 hours. CBG: Recent Labs  Lab 08/02/20 2359  GLUCAP 111*   Lipid Profile: No results for input(s): CHOL, HDL, LDLCALC, TRIG, CHOLHDL, LDLDIRECT in the last 72 hours. Thyroid Function Tests: Recent Labs    08/03/20 0654  TSH 1.901   Anemia Panel: No results for input(s): VITAMINB12, FOLATE, FERRITIN, TIBC, IRON, RETICCTPCT in the last 72 hours. Urine analysis:    Component Value Date/Time   COLORURINE YELLOW 10/30/2019 2335   APPEARANCEUR HAZY (A) 10/30/2019 2335   LABSPEC 1.013 10/30/2019 2335   PHURINE 7.0 10/30/2019 2335   GLUCOSEU 50 (A) 10/30/2019 2335   HGBUR NEGATIVE 10/30/2019 2335   BILIRUBINUR NEGATIVE 10/30/2019 2335   BILIRUBINUR Negative 03/19/2019 1354   KETONESUR 5 (A) 10/30/2019 2335   PROTEINUR >=300 (A)  10/30/2019 2335   UROBILINOGEN 0.2 03/19/2019 1354   UROBILINOGEN 0.2 02/26/2015 1028   NITRITE NEGATIVE 10/30/2019 2335   LEUKOCYTESUR NEGATIVE 10/30/2019 2335   Sepsis Labs: @LABRCNTIP (procalcitonin:4,lacticidven:4)  ) Recent Results (from the past 240 hour(s))  Resp Panel by RT-PCR (Flu A&B, Covid) Nasopharyngeal Swab     Status: None   Collection Time: 08/02/20 10:02 PM   Specimen: Nasopharyngeal Swab; Nasopharyngeal(NP) swabs in vial transport medium  Result Value Ref Range Status  SARS Coronavirus 2 by RT PCR NEGATIVE NEGATIVE Final    Comment: (NOTE) SARS-CoV-2 target nucleic acids are NOT DETECTED.  The SARS-CoV-2 RNA is generally detectable in upper respiratory specimens during the acute phase of infection. The lowest concentration of SARS-CoV-2 viral copies this assay can detect is 138 copies/mL. A negative result does not preclude SARS-Cov-2 infection and should not be used as the sole basis for treatment or other patient management decisions. A negative result may occur with  improper specimen collection/handling, submission of specimen other than nasopharyngeal swab, presence of viral mutation(s) within the areas targeted by this assay, and inadequate number of viral copies(<138 copies/mL). A negative result must be combined with clinical observations, patient history, and epidemiological information. The expected result is Negative.  Fact Sheet for Patients:  EntrepreneurPulse.com.au  Fact Sheet for Healthcare Providers:  IncredibleEmployment.be  This test is no t yet approved or cleared by the [place] FDA and  has been authorized for detection and/or diagnosis of SARS-CoV-2 by FDA under an Emergency Use Authorization (EUA). This EUA will remain  in effect (meaning this test can be used) for the duration of the COVID-19 declaration under Section 564(b)(1) of the Act, 21 U.S.C.section 360bbb-3(b)(1), unless the  authorization is terminated  or revoked sooner.       Influenza A by PCR NEGATIVE NEGATIVE Final   Influenza B by PCR NEGATIVE NEGATIVE Final    Comment: (NOTE) The Xpert Xpress SARS-CoV-2/FLU/RSV plus assay is intended as an aid in the diagnosis of influenza from Nasopharyngeal swab specimens and should not be used as a sole basis for treatment. Nasal washings and aspirates are unacceptable for Xpert Xpress SARS-CoV-2/FLU/RSV testing.  Fact Sheet for Patients: EntrepreneurPulse.com.au  Fact Sheet for Healthcare Providers: IncredibleEmployment.be  This test is not yet approved or cleared by the [place] FDA and has been authorized for detection and/or diagnosis of SARS-CoV-2 by FDA under an Emergency Use Authorization (EUA). This EUA will remain in effect (meaning this test can be used) for the duration of the COVID-19 declaration under Section 564(b)(1) of the Act, 21 U.S.C. section 360bbb-3(b)(1), unless the authorization is terminated or revoked.  Performed at [facility], [place] [address], [place], [place] [address]   Culture, blood (routine x 2)     Status: None (Preliminary result)   Collection Time: 08/03/20  6:43 AM   Specimen: BLOOD LEFT HAND  Result Value Ref Range Status   Specimen Description BLOOD LEFT HAND  Final   Special Requests   Final    BOTTLES DRAWN AEROBIC ONLY Blood Culture results may not be optimal due to an inadequate volume of blood received in culture bottles   Culture   Final    NO GROWTH < 24 HOURS Performed at [facility], [place] [address] [place], [place] [address]    Report Status PENDING  Incomplete  Culture, blood (routine x 2)     Status: None (Preliminary result)   Collection Time: 08/03/20  6:53 AM   Specimen: BLOOD LEFT HAND  Result Value Ref Range Status   Specimen Description BLOOD LEFT HAND  Final   Special Requests   Final    BOTTLES DRAWN AEROBIC ONLY Blood Culture results  may not be optimal due to an inadequate volume of blood received in culture bottles   Culture   Final    NO GROWTH < 24 HOURS Performed at [facility], [place] [address], [place], [place] [address]    Report  Status PENDING  Incomplete      Studies: No results found.  Scheduled Meds:  amLODipine  10 mg Oral QPM   carvedilol  25 mg Oral BID WC   Chlorhexidine Gluconate Cloth  6 each Topical Q0600   [START ON 08/05/2020] Chlorhexidine Gluconate Cloth  6 each Topical Q0600   heparin injection (subcutaneous)  5,000 Units Subcutaneous Q8H   hydrALAZINE  100 mg Oral TID   multivitamin  1 tablet Oral Daily   sevelamer carbonate  2,400-4,000 mg Oral TID   sodium chloride flush  3 mL Intravenous Q12H   sodium zirconium cyclosilicate  10 g Oral BID    Continuous Infusions:  sodium chloride       LOS: 2 days     Alma Friendly, MD [facility]  If 7PM-7AM, please contact night-coverage [email or web address] 08/04/2020, 5:35 PM

## 2020-08-04 NOTE — TOC Transition Note (Signed)
Transition of Care [facility]) - CM/SW Discharge Note   Patient Details  Name: Jason [name]. MRN: [id number] Date of Birth: [birth year]  Transition of Care [facility]) CM/SW Contact:  Zenon Mayo, RN Phone Number: 08/04/2020, 4:34 PM   Clinical Narrative:    NCM spoke with patient, he lives alone , he does not have a scale or bp cuff. He is indepdent. He states he will need transportation assistance at dc.  TOC will continue to follow for dc needs.   Final next level of care: Home/Self Care Barriers to Discharge: No Barriers Identified   Patient Goals and CMS Choice Patient states their goals for this hospitalization and ongoing recovery are:: return home   Choice offered to / list presented to : NA  Discharge Placement                       Discharge Plan and Services   Discharge Planning Services: CM Consult Post Acute Care Choice: NA            DME Agency: NA       HH Arranged: NA          Social Determinants of Health (SDOH) Interventions     Readmission Risk Interventions Readmission Risk Prevention Plan 08/04/2020 04/30/2019  Transportation Screening Complete Complete  PCP or Specialist Appt within 3-5 Days - Complete  HRI or [place] Complete Complete  Social Work Consult for [place] Planning/Counseling Complete Complete  Palliative Care Screening Not Applicable Not Applicable  Medication Review Press photographer) Complete Complete  Some recent data might be hidden

## 2020-08-04 NOTE — Progress Notes (Signed)
  [facility] Progress Note   Subjective:  Seen in room. Did need O2 overnight, no sob this am. Hyperkalemia on labs this am.   Objective Vitals:   08/04/20 0357 08/04/20 0737 08/04/20 0917 08/04/20 1111  BP: (!) 154/95 (!) 146/96 (!) 155/86 (!) 160/95  Pulse: 66 64  66  Resp: 18 20  20   Temp: 97.9 F (36.6 C) 98.2 F (36.8 C)  98.3 F (36.8 C)  TempSrc: Oral Oral  Oral  SpO2: 100% 95%  94%  Weight:      Height:         Additional Objective Labs: Basic Metabolic Panel: Recent Labs  Lab 08/02/20 2200 08/03/20 0040 08/03/20 0654 08/04/20 0759  NA 133*  --  138 136  K >7.5*  --  4.5 6.1*  CL 92*  --  99 97*  CO2 22  --  26 28  GLUCOSE 114*  --  85 117*  BUN 147*  --  61* 83*  CREATININE 17.19*  --  9.23* 12.21*  CALCIUM 9.1  --  9.3 8.5*  PHOS  --  11.6* 6.3* 10.3*   CBC: Recent Labs  Lab 08/02/20 2200 08/03/20 0654 08/04/20 0759  WBC 4.8 3.9* 4.5  NEUTROABS 3.4 2.3 2.6  HGB 10.8* 10.7* 9.9*  HCT 31.7* 30.8* 30.3*  MCV 90.8 89.8 92.9  PLT 244 205 195   Blood Culture    Component Value Date/Time   SDES BLOOD LEFT HAND 08/03/2020 0653   SPECREQUEST  08/03/2020 0653    BOTTLES DRAWN AEROBIC ONLY Blood Culture results may not be optimal due to an inadequate volume of blood received in culture bottles   CULT  08/03/2020 0653    NO GROWTH < 24 HOURS Performed at [facility], [place] [address], [place], [place] [address]    REPTSTATUS PENDING 08/03/2020 2633     Physical Exam General: Well appearing, nad Heart: RRR Lungs: Clear, bilaterally  Abdomen: soft non-tender  Extremities: Trace LE edema  Dialysis Access: RUE AVF +bruit   Medications:  sodium chloride      amLODipine  10 mg Oral QPM   carvedilol  25 mg Oral BID WC   Chlorhexidine Gluconate Cloth  6 each Topical Q0600   [START ON 08/05/2020] Chlorhexidine Gluconate Cloth  6 each Topical Q0600   heparin injection (subcutaneous)  5,000 Units Subcutaneous Q8H   hydrALAZINE   100 mg Oral TID   multivitamin  1 tablet Oral Daily   sevelamer carbonate  2,400-4,000 mg Oral TID   sodium chloride flush  3 mL Intravenous Q12H   sodium zirconium cyclosilicate  10 g Oral BID    Dialysis Orders: [facility]  4h 77min   500/800    EDW 75kg  2/2 bath  AVF  Hep 6000 Mircera 50 q 2wks (last 7/14)  Assessment/Plan: Severe hyperkalemia on admit - Improved with dialysis Saturday night. K+ 6.1 this am. Lokelma ordered. Will plan for extra HD today if schedule allows.  Acute hypoxic resp failure - w/ O2 requirement and early edema by CXR due to vol overload from missed HD mainly. HD again today  ESRD - usual HD TTS. Missed HD x 2.  HD off schedule today Recent back pain/ possible L5/S1 discitis Anemia  CKD - Hb 10.8, follow MBD ckd - cont binder, Ca is ok. Phos uncontrolled. Continue binders.   Jason Child PA-C [facility] 08/04/2020,11:21 AM

## 2020-08-04 NOTE — Progress Notes (Signed)
Lab called with critical potassium lab value of 6.3. Paged Horris Latino MD to notify.

## 2020-08-04 NOTE — Progress Notes (Signed)
Patient Jason [name] potassium resulted at a level of 6.1. Horris Latino MD paged to notify.

## 2020-08-04 NOTE — Progress Notes (Signed)
Pt hemodialysis tx move to tomorrow by Dr. Jonnie Finner ordered.

## 2020-08-04 NOTE — Plan of Care (Signed)

## 2020-08-05 DIAGNOSIS — R778 Other specified abnormalities of plasma proteins: Secondary | ICD-10-CM

## 2020-08-05 LAB — RENAL FUNCTION PANEL
Albumin: 3.5 g/dL (ref 3.5–5.0)
Anion gap: 8 (ref 5–15)
BUN: 39 mg/dL — ABNORMAL HIGH (ref 6–20)
CO2: 31 mmol/L (ref 22–32)
Calcium: 8.2 mg/dL — ABNORMAL LOW (ref 8.9–10.3)
Chloride: 98 mmol/L (ref 98–111)
Creatinine, Ser: 7.56 mg/dL — ABNORMAL HIGH (ref 0.61–1.24)
GFR, Estimated: 8 mL/min — ABNORMAL LOW (ref >60)
Glucose, Bld: 92 mg/dL (ref 70–99)
Phosphorus: 5.1 mg/dL — ABNORMAL HIGH (ref 2.5–4.6)
Potassium: 4.3 mmol/L (ref 3.5–5.1)
Sodium: 137 mmol/L (ref 135–145)

## 2020-08-05 MED ORDER — CHLORHEXIDINE GLUCONATE CLOTH 2 % EX PADS: 6.0000 | MEDICATED_PAD | Freq: Every day | CUTANEOUS | Status: DC

## 2020-08-05 NOTE — Progress Notes (Signed)
Physical Therapy Treatment Patient Details Name: Jason [name]. MRN: [id number] DOB: 02/14/1960 Today's Date: 08/05/2020    History of Present Illness Pt is a 60 y.o. male admitted 08/02/20 with fatigue, chills, nausea, SOB; of note, pt had not been to HD since 7/19. Workup for hyperkalemia, fluid overload. Of note, recent d/c 07/22/20 s/p L5-S1 disc aspiration. Other PMH includes ESRD (HD TTS), HTN, HF, COPD.   PT Comments    Pt progressing well with mobility. Independent with transfers and ambulation without DME, mod indep with stairs and rail support; no overt instability or LOB with higher level balance tasks. Pt denies SOB with activity, although portable pulse ox reading SpO2 79% on RA with ambulation; rechecked with dynamap monitor reading 87-92% on RA. Pt has met short-term acute PT goals; reports no further questions or concerned. Encouraged more frequent OOB activity while admitted, including hallway ambulation. Will d/c acute TP.    Follow Up Recommendations  No PT follow up     Equipment Recommendations  None recommended by PT    Recommendations for Other Services       Precautions / Restrictions Precautions Precautions: Other (comment) Precaution Comments: Back precautions for comfort; monitor SpO2 Restrictions Weight Bearing Restrictions: No    Mobility  Bed Mobility Overal bed mobility: Independent                  Transfers Overall transfer level: Independent Equipment used: None Transfers: Sit to/from Stand              Ambulation/Gait Ambulation/Gait assistance: Independent Social research officer, government (Feet): 400 Feet Assistive device: None Gait Pattern/deviations: Step-through pattern;Decreased stride length     General Gait Details: Steady gait independent without DME; pt denies SOB although SpO2 reading down to 79% via finger pulse ox; upon immediate return to room, SpO2 checked with dynamap monitor and reading 87-92% on RA with reliable  pleth   Stairs Stairs: Yes Stairs assistance: Modified independent (Device/Increase time) Stair Management: One rail Right;One rail Left;Alternating pattern;Forwards;Step to pattern Number of Stairs: 11 General stair comments: Ascend/descend steps mod indep with rail support; initial alternating pattern to ascend, descent progressing to step-top pattern   Wheelchair Mobility    Modified Rankin (Stroke Patients Only)       Balance Overall balance assessment: No apparent balance deficits (not formally assessed)   Sitting balance-Leahy Scale: Normal       Standing balance-Leahy Scale: Good               High level balance activites: Side stepping;Backward walking;Direction changes;Turns;Sudden stops;Head turns High Level Balance Comments: no overt LOB or instability with higher level balance tasks            Cognition Arousal/Alertness: Awake/alert Behavior During Therapy: WFL for tasks assessed/performed Overall Cognitive Status: Within Functional Limits for tasks assessed                                        Exercises      General Comments General comments (skin integrity, edema, etc.): Pt received on 3L O2 [place] (reports "I just put this on") - states he has never worn O2 at home, but typically wears during HD      Pertinent Vitals/Pain Pain Assessment: Faces Faces Pain Scale: Hurts little more Pain Location: Lower back Pain Descriptors / Indicators: Discomfort;Guarding Pain Intervention(s): Monitored during session;Patient requesting pain meds-RN notified  Home Living                      Prior Function            PT Goals (current goals can now be found in the care plan section) Progress towards PT goals: Goals met/education completed, patient discharged from PT    Frequency    Min 3X/week      PT Plan Current plan remains appropriate    Co-evaluation              AM-PAC PT "6 Clicks" Mobility    Outcome Measure  Help needed turning from your back to your side while in a flat bed without using bedrails?: None Help needed moving from lying on your back to sitting on the side of a flat bed without using bedrails?: None Help needed moving to and from a bed to a chair (including a wheelchair)?: None Help needed standing up from a chair using your arms (e.g., wheelchair or bedside chair)?: None Help needed to walk in hospital room?: None Help needed climbing 3-5 steps with a railing? : None 6 Click Score: 24    End of Session   Activity Tolerance: Patient tolerated treatment well;Patient limited by pain Patient left: in bed;with call bell/phone within reach Nurse Communication: Mobility status;Patient requests pain meds PT Visit Diagnosis: Unsteadiness on feet (R26.81);Muscle weakness (generalized) (M62.81);Difficulty in walking, not elsewhere classified (R26.2)     Time: 8159-4707 PT Time Calculation (min) (ACUTE ONLY): 18 min  Charges:  $Gait Training: 8-22 mins                     Mabeline Caras, PT, DPT Acute Rehabilitation Services  Pager [phone] Office [place] 08/05/2020, 2:33 PM

## 2020-08-05 NOTE — Progress Notes (Signed)
[facility] Progress Note   Subjective:   Patient seen and examined at bedside.  Reports completed dialysis around 5AM.  Tolerated well.  Discussed low K diet.  Admits to drinking OJ over the weekend. Denies CP, SOB, n/v/d, abdominal pain, weakness, dizziness and fatigue.   Objective Vitals:   08/05/20 0545 08/05/20 0605 08/05/20 0718 08/05/20 1117  BP: (!) 164/83 (!) 153/103 135/85 (!) 148/86  Pulse:  65 68 61  Resp: 15 17 16 18   Temp:  98.1 F (36.7 C) 98.9 F (37.2 C) 98.7 F (37.1 C)  TempSrc:  Oral Oral Oral  SpO2:  91% 94% 99%  Weight:      Height:       Physical Exam General:well appearing male in NAD Heart:RRR, no mrg Lungs:CTAB, nml WOB on RA Abdomen:soft, NTND Extremities:no LE edema Dialysis Access: aneurysmal RU AVF +b/t   Filed Weights   08/05/20 0043 08/05/20 0115 08/05/20 0127  Weight: 79.8 kg 77.2 kg 77.2 kg    Intake/Output Summary (Last 24 hours) at 08/05/2020 1327 Last data filed at 08/05/2020 1313 Gross per 24 hour  Intake 1020 ml  Output 3000 ml  Net -1980 ml    Additional Objective Labs: Basic Metabolic Panel: Recent Labs  Lab 08/03/20 0654 08/04/20 0759 08/04/20 1502 08/04/20 2120 08/05/20 0635  NA 138 136  --   --  137  K 4.5 6.1* 6.3* 6.4* 4.3  CL 99 97*  --   --  98  CO2 26 28  --   --  31  GLUCOSE 85 117*  --   --  92  BUN 61* 83*  --   --  39*  CREATININE 9.23* 12.21*  --   --  7.56*  CALCIUM 9.3 8.5*  --   --  8.2*  PHOS 6.3* 10.3*  --   --  5.1*   Liver Function Tests: Recent Labs  Lab 08/03/20 0040 08/03/20 0654 08/04/20 0759 08/05/20 0635  AST 68* 14*  --   --   ALT 13 13  --   --   ALKPHOS 30* 41  --   --   BILITOT 0.8 1.0  --   --   PROT 6.2* 7.1  --   --   ALBUMIN 3.3* 3.7 3.1* 3.5    CBC: Recent Labs  Lab 08/02/20 2200 08/03/20 0654 08/04/20 0759  WBC 4.8 3.9* 4.5  NEUTROABS 3.4 2.3 2.6  HGB 10.8* 10.7* 9.9*  HCT 31.7* 30.8* 30.3*  MCV 90.8 89.8 92.9  PLT 244 205 195   Blood  Culture    Component Value Date/Time   SDES BLOOD LEFT HAND 08/03/2020 0653   SPECREQUEST  08/03/2020 0653    BOTTLES DRAWN AEROBIC ONLY Blood Culture results may not be optimal due to an inadequate volume of blood received in culture bottles   CULT  08/03/2020 0653    NO GROWTH 2 DAYS Performed at [facility], [address] [place], [place] [address]    REPTSTATUS PENDING 08/03/2020 6834    Medications:  sodium chloride      amLODipine  10 mg Oral QPM   carvedilol  25 mg Oral BID WC   Chlorhexidine Gluconate Cloth  6 each Topical Q0600   heparin injection (subcutaneous)  5,000 Units Subcutaneous Q8H   hydrALAZINE  100 mg Oral TID   multivitamin  1 tablet Oral Daily   sevelamer carbonate  2,400-4,000 mg Oral TID   sodium chloride flush  3  mL Intravenous Q12H    Dialysis Orders: TTS South  4h 52min   500/800    EDW 75kg  2/2 bath  AVF  Hep 6000 Mircera 50 q 2wks (last 7/14)   Assessment/Plan: Severe hyperkalemia on admit - Improved with dialysis Saturday night. K+ 6.1 yesterday AM and HD completed early this AM.  K post 4.3. Discussed importance of following low K diet.  Recheck labs in AM and give dose if K>5.   Acute hypoxic resp failure - improved.  Normal work of breathing on RA on exam.  Volume overload 2/2 missed HD. ESRD - HD TTS.  HD completed this AM per regular schedule.  Will plan for next HD on 7/28.  Recent back pain/ possible L5/S1 discitis - BC ordered.  Anemia  CKD - Hb 9.9.  ESA due 7/28.  MBD ckd - calcium and phos in goal today.  Continue binder. Not on VDRA.  Nutrition - renal diet w/fluid restrictions.   Jen Mow, PA-C [facility] 08/05/2020,1:27 PM  LOS: 3 days

## 2020-08-06 ENCOUNTER — Telehealth: Payer: Self-pay | Admitting: Nephrology

## 2020-08-06 ENCOUNTER — Other Ambulatory Visit: Payer: Self-pay

## 2020-08-06 NOTE — Telephone Encounter (Signed)
Transition of Care Contact from [place]   Date of Discharge: 08/05/20 Date of Contact: 08/06/20 Method of contact: phone Talked to patient   Patient contacted to discuss transition of care form recent hospitaliztion. Patient was admitted to [facility] from 08/02/20 to 08/05/20 with the discharge diagnosis of hyperkalemia, fluid overload, and hypertensive crisis.     Medication changes were reviewed - advised he did not need to take PO hectorol, given IV during HD.   Patient will follow up with is outpatient dialysis center 08/07/20.   Other follow up needs include f/u with cardiology.   Jen Mow, PA-C [facility] Pager: [phone]

## 2020-08-06 NOTE — Patient Outreach (Signed)
[facility]) Care Management  08/06/2020  Jason [name] Jan 24, 1960 [id number]   Referral Date: 08/06/20 Referral Source: [employer] Report Date of Discharge: 08/05/20 Facility:  [place]: [facility]   Referral received.  No outreach warranted at this time.  Transition of Care calls being completed via EMMI. RN CM will outreach patient for any red flags received.    Plan: RN CM will close case.    [name], RN, MSN [facility] Care Management Care Management Coordinator Direct Line [phone] Toll Free: [phone]  Fax: [phone]

## 2020-08-06 NOTE — Discharge Summary (Signed)
Discharge Summary  Jason [name]. FYB:[id number] DOB: 1960/10/11  PCP: [name], FNP (Inactive)  Admit date: 08/02/2020 Discharge date: 08/05/2020  Time spent: 40 mins  Recommendations for Outpatient Follow-up:  Follow-up with PCP in 1 week Follow-up with nephrology, HD as scheduled   Discharge Diagnoses:  Active Hospital Problems   Diagnosis Date Noted   Acute respiratory failure with hypoxia ([place]) 08/03/2020   Volume overload 07/17/2020   Hyperkalemia 04/26/2019   Chronic combined systolic and diastolic congestive heart failure ([place]) 04/26/2019   Hypertensive urgency 02/17/2019   ESRD (end stage renal disease) on dialysis [facility])    Elevated troponin level not due myocardial infarction 01/16/2017   Essential hypertension 02/26/2015    Resolved Hospital Problems  No resolved problems to display.    Discharge Condition: Stable  Diet recommendation: Renal diet  Vitals:   08/05/20 0718 08/05/20 1117  BP: 135/85 (!) 148/86  Pulse: 68 61  Resp: 16 18  Temp: 98.9 F (37.2 C) 98.7 F (37.1 C)  SpO2: 94% 99%    History of present illness:  Jason Crull. is a 60 y.o. male with medical history significant of  ESRD on TTS dialysis, HTN, mod PAH, HFpEF, COPD, presented with  fatigue, chills, shortness of breath nausea. Pt missed 2 HD sessions, still makes urine. Hx of L5/S1 discitis/osteomyelitis.  In the ED, noted to have hyperkalemia with potassium of greater than 7.5, chest x-ray with noted edema.  Patient admitted for emergency HD and further management.     Patient denied any new complaints, denies any chest pain, shortness of breath, back pain, abdominal pain, nausea/vomiting, fever/chills.  Patient advised to adhere to a renal diet and to be compliant with medications as well as HD.  Hospital Course:  Active Problems:   Essential hypertension   Elevated troponin level not due myocardial infarction   ESRD (end stage renal disease) on dialysis [facility])    Hypertensive urgency   Hyperkalemia   Chronic combined systolic and diastolic congestive heart failure (H[facility])   Volume overload   Acute respiratory failure with hypoxia ([facility])   Fluid overload in ESRD patient (TTS schedule) Hyperkalemia Missed 2 sessions of HD PTA Nephrology consulted, had emergent HD on 08/02/2020 Advised to be compliant with HD, renal diet and BP medications   Hypertensive crisis Stable upon discharge Continue amlodipine, Coreg, hydralazine, advised to be compliant   Elevated troponin Denies any chest pain Troponin with flat trend EKG with no acute ST changes   Chronic diastolic HF Last echo on 5/27 showed EF of 50 to 78%, grade 1 diastolic dysfunction Fluid management with HD   Acute hypoxic respiratory failure Likely 2/2 fluid overload On admission, required 4 L nasal cannula, weaned to room air upon discharge Management as above with HD   Hx of L5/S1 discitis/osteomyelitis Denies any further back pain Had extensive work-up including MRI and IR guided lumbar disc aspiration BC x2 NGTD Continue pain management       Estimated body mass index is 21.85 kg/m as calculated from the following:   Height as of this encounter: 6\' 2"  (1.88 m).   Weight as of this encounter: 77.2 kg.    Procedures: None  Consultations: Nephrology  Discharge Exam: BP (!) 148/86 (BP Location: Left Arm)   Pulse 61   Temp 98.7 F (37.1 C) (Oral)   Resp 18   Ht 6\' 2"  (1.88 m)   Wt 77.2 kg   SpO2 99%   BMI 21.85 kg/m  General: NAD  Cardiovascular: S1, S2 present Respiratory: CTAB Abdomen: Soft, nontender, nondistended, bowel sounds present Musculoskeletal: No bilateral pedal edema noted, RU AVF Skin: Normal Psychiatry: Normal mood    Discharge Instructions You were cared for by a hospitalist during your hospital stay. If you have any questions about your discharge medications or the care you received while you were in the hospital after you are discharged,  you can call the unit and asked to speak with the hospitalist on call if the hospitalist that took care of you is not available. Once you are discharged, your primary care physician will handle any further medical issues. Please note that NO REFILLS for any discharge medications will be authorized once you are discharged, as it is imperative that you return to your primary care physician (or establish a relationship with a primary care physician if you do not have one) for your aftercare needs so that they can reassess your need for medications and monitor your lab values.  Discharge Instructions     Diet - low sodium heart healthy   Complete by: As directed    Increase activity slowly   Complete by: As directed       Allergies as of 08/05/2020       Reactions   Lisinopril Cough        Medication List     TAKE these medications    amLODipine 10 MG tablet Commonly known as: NORVASC Take 10 mg by mouth every morning.   carvedilol 25 MG tablet Commonly known as: COREG Take 1 tablet (25 mg total) by mouth 2 (two) times daily with a meal.   doxercalciferol 0.5 MCG capsule Commonly known as: HECTOROL Doxercalciferol (Hectorol)   hydrALAZINE 100 MG tablet Commonly known as: APRESOLINE Take 1 tablet (100 mg total) by mouth 3 (three) times daily. What changed: when to take this   HYDROcodone-acetaminophen 5-325 MG tablet Commonly known as: NORCO/VICODIN Take 1 tablet by mouth every 6 (six) hours as needed for severe pain.   iron sucrose in sodium chloride 0.9 % 100 mL Iron Sucrose (Venofer)   methocarbamol 500 MG tablet Commonly known as: ROBAXIN Take 1 tablet (500 mg total) by mouth every 8 (eight) hours as needed for muscle spasms.   MIRCERA IJ Mircera   multivitamin Tabs tablet Take 1 tablet by mouth every morning.   polyvinyl alcohol 1.4 % ophthalmic solution Commonly known as: LIQUIFILM TEARS Place 1 drop into both eyes daily as needed for dry eyes.    sevelamer carbonate 800 MG tablet Commonly known as: RENVELA Take 2,400-4,000 mg by mouth See admin instructions. Take 3-5 tablets (2400-4000 mg) by mouth with big meals - up to three times daily       Allergies  Allergen Reactions   Lisinopril Cough    Follow-up Information     [name], FNP. Go on 10/08/2020.   Specialty: Family Medicine Why: @1 :00pm Contact information: [address] [place] [id number] [phone]         Lelon Perla, MD. Go on 09/10/2020.   Specialty: Cardiology Why: @2 :45pm Contact information: [place]  [phone]                  The results of significant diagnostics from this hospitalization (including imaging, microbiology, ancillary and laboratory) are listed below for reference.    Significant Diagnostic Studies: DG Chest 2 View  Result Date: 07/17/2020 CLINICAL DATA:  60 year old male with shortness  of breath and cough for 1 week. EXAM: CHEST - 2 VIEW COMPARISON:  July 13, 2020. FINDINGS: Image mildly rotated to the LEFT. Accounting for this cardiomediastinal contours and hilar structures are stable. No lobar consolidation or sign of pleural effusion. Signs of vascular crowding/pulmonary vascular congestion. No visible pneumothorax. No effusion. On limited assessment no acute skeletal process. IMPRESSION: Cardiomegaly with pulmonary vascular congestion as before. Electronically Signed   By: Zetta Bills M.D.   On: 07/17/2020 12:54   DG Lumbar Spine Complete  Result Date: 07/17/2020 CLINICAL DATA:  Back pain EXAM: LUMBAR SPINE - COMPLETE 4+ VIEW COMPARISON:  None. FINDINGS: Straightening of the lumbar spine. Vertebral body heights are maintained. Moderate disc space narrowing and degenerative change at L5-S1. Facet degenerative changes of the lower lumbar spine. IMPRESSION: Straightening of the lumbar spine with moderate to marked degenerative change at L5-S1 Electronically  Signed   By: Donavan Foil M.D.   On: 07/17/2020 21:48   MR LUMBAR SPINE WO CONTRAST  Result Date: 07/18/2020 CLINICAL DATA:  Weakness and back pain. History end-stage renal disease and anemia. Infection suspected. EXAM: MRI LUMBAR SPINE WITHOUT CONTRAST TECHNIQUE: Multiplanar, multisequence MR imaging of the lumbar spine was performed. No intravenous contrast was administered. COMPARISON:  Lumbar spine radiographs 07/17/2020. FINDINGS: Segmentation: Conventional anatomy assumed, with the last open disc space designated L5-S1. Alignment:  Straightening without focal angulation or listhesis. Vertebrae: As demonstrated radiographically, there is advanced disc space narrowing at L5-S1 with endplate osteophytes. There is prominent T2 hyperintensity within the L5-S1 disc, and there are associated endplate erosions and edema. No other suspicious disc space findings. The visualized sacroiliac joints appear unremarkable. Conus medullaris: Extends to the T12-L1 level and appears normal. Paraspinal and other soft tissues: Mild paraspinal inflammatory changes anteriorly at L5-S1 without focal fluid collection. Disc levels: L1-2: Disc height and hydration are maintained. Mild anterior osteophyte formation. No spinal stenosis or nerve root encroachment. L2-3: Mild disc bulging and anterior osteophyte formation. Mild facet hypertrophy. No spinal stenosis or nerve root encroachment. L3-4: The disc appears normal. Mild facet hypertrophy. No spinal stenosis or nerve root encroachment. L4-5: The disc appears normal. Mild bilateral facet hypertrophy. No spinal stenosis or nerve root encroachment. L5-S1: As above, chronic degenerative disc disease with loss of disc height, annular disc bulging and endplate osteophytes. Moderate facet and ligamentous hypertrophy. Moderate foraminal narrowing is present bilaterally. There is no epidural fluid collection. IMPRESSION: 1. Nonspecific, but suspicious, disc and endplate changes at D7-O2 as  described. Findings could relate to dialysis-related spondyloarthropathy, although the paraspinal inflammatory changes are moderately suspicious for diskitis and early osteomyelitis. Consider disc space aspiration and/or short-term MR follow-up without and with contrast. 2. No focal fluid collection or epidural inflammation identified. 3. Moderate foraminal narrowing bilaterally at L5-S1. 4. No other significant disc space findings. Electronically Signed   By: Richardean Sale M.D.   On: 07/18/2020 10:05   DG Chest Port 1 View  Result Date: 08/02/2020 CLINICAL DATA:  Shortness breath EXAM: PORTABLE CHEST 1 VIEW COMPARISON:  07/17/2020 FINDINGS: No focal consolidative process is seen. There is some fine interstitial opacity and pulmonary vascular congestion which could reflect early interstitial edematous change. Partial obscuration of the left costophrenic sulcus, could reflect some trace layering pleural effusion. Right costophrenic sulcus more well-defined. No visible pneumothorax. Suspect enlarged cardiac silhouette though may be accentuated by portable technique. Remaining cardiomediastinal contours are similar to comparison prior. No acute or worrisome abnormality of the chest wall. Telemetry leads and support devices overlie the  chest. IMPRESSION: Mild pulmonary vascular congestion with some fine interstitial opacity, could reflect early edema. Cardiomegaly, similar to comparison prior. Electronically Signed   By: Lovena Le M.D.   On: 08/02/2020 22:26   DG Chest Port 1 View  Result Date: 07/13/2020 CLINICAL DATA:  Shortness of breath.  Hypoxia. EXAM: PORTABLE CHEST 1 VIEW COMPARISON:  10/30/2019 FINDINGS: The heart is enlarged and stable in configuration. Lungs are free of focal consolidations and pleural effusions. No pulmonary edema. IMPRESSION: Stable cardiomegaly. Electronically Signed   By: Nolon Nations M.D.   On: 07/13/2020 11:53   IR LUMBAR DISC ASPIRATION W/IMG GUIDE  Result Date:  07/21/2020 INDICATION: Arel Tippen. is a 60 y.o. male with past medical history significant of asthma, ESRD on renal replacement therapy, HTN, CHF, bilateral pleural effusion, chronic venous insufficiency, who presented to [facility] ED due to weakness and back pain, currently admitted due to acute hypoxic respiratory failure in the setting of acute pulmonary edema due to acute on chronic systolic CHF in setting of missed dialysis. Has a workup for back pain, he underwent MRI of lumbar spine without contrast on 07/18/2020 with findings concerning for possible L5-S1 discitis. He comes to our service today for a fluoroscopy guided L5-S1 fine-needle aspiration EXAM: L5-S1 FLUOROSCOPY GUIDED FINE-NEEDLE ASPIRATION MEDICATIONS: The patient is currently admitted to the hospital and receiving intravenous antibiotics. The antibiotics were administered within an appropriate time frame prior to the initiation of the procedure. ANESTHESIA/SEDATION: Fentanyl 25 mcg IV; Versed 1 mg IV Moderate Sedation Time:  31 The patient was continuously monitored during the procedure by the interventional radiology nurse under my direct supervision. COMPLICATIONS: None immediate. PROCEDURE: Informed written consent was obtained from the patient after a thorough discussion of the procedural risks, benefits and alternatives. All questions were addressed. Maximal Sterile Barrier Technique was utilized including caps, mask, sterile gowns, sterile gloves, sterile drape, hand hygiene and skin antiseptic. A timeout was performed prior to the initiation of the procedure. The patient was placed in prone position on the angiography table. The lumbar spine region was prepped and draped in a sterile fashion. Under fluoroscopy, the L5-S1 disc space was delineated and the skin area was marked. The skin was infiltrated with a 1% Lidocaine approximately 7 cm lateral to the spinous process projection on the right. Using a 22-gauge spinal needle, the paraspinal  soft tissues were infiltrated with Bupivacaine 0.5%. The spinal needle was then advanced into the L5-S1 disc space. A fine-needle aspiration sample was then collected and sent to laboratory for Gram stain culture. The needle was subsequently withdrawn. The access sites were cleaned and covered with a sterile bandage. IMPRESSION: Fluoroscopy-guided L5-S1 fine-needle aspiration for Gram stain and culture. Electronically Signed   By: Pedro Earls M.D.   On: 07/21/2020 14:41    Microbiology: Recent Results (from the past 240 hour(s))  Resp Panel by RT-PCR (Flu A&B, Covid) Nasopharyngeal Swab     Status: None   Collection Time: 08/02/20 10:02 PM   Specimen: Nasopharyngeal Swab; Nasopharyngeal(NP) swabs in vial transport medium  Result Value Ref Range Status   SARS Coronavirus 2 by RT PCR NEGATIVE NEGATIVE Final    Comment: (NOTE) SARS-CoV-2 target nucleic acids are NOT DETECTED.  The SARS-CoV-2 RNA is generally detectable in upper respiratory specimens during the acute phase of infection. The lowest concentration of SARS-CoV-2 viral copies this assay can detect is 138 copies/mL. A negative result does not preclude SARS-Cov-2 infection and should not be used as the  sole basis for treatment or other patient management decisions. A negative result may occur with  improper specimen collection/handling, submission of specimen other than nasopharyngeal swab, presence of viral mutation(s) within the areas targeted by this assay, and inadequate number of viral copies(<138 copies/mL). A negative result must be combined with clinical observations, patient history, and epidemiological information. The expected result is Negative.  Fact Sheet for Patients:  EntrepreneurPulse.com.au  Fact Sheet for Healthcare Providers:  IncredibleEmployment.be  This test is no t yet approved or cleared by the [place] FDA and  has been authorized for detection  and/or diagnosis of SARS-CoV-2 by FDA under an Emergency Use Authorization (EUA). This EUA will remain  in effect (meaning this test can be used) for the duration of the COVID-19 declaration under Section 564(b)(1) of the Act, 21 U.S.C.section 360bbb-3(b)(1), unless the authorization is terminated  or revoked sooner.       Influenza A by PCR NEGATIVE NEGATIVE Final   Influenza B by PCR NEGATIVE NEGATIVE Final    Comment: (NOTE) The Xpert Xpress SARS-CoV-2/FLU/RSV plus assay is intended as an aid in the diagnosis of influenza from Nasopharyngeal swab specimens and should not be used as a sole basis for treatment. Nasal washings and aspirates are unacceptable for Xpert Xpress SARS-CoV-2/FLU/RSV testing.  Fact Sheet for Patients: EntrepreneurPulse.com.au  Fact Sheet for Healthcare Providers: IncredibleEmployment.be  This test is not yet approved or cleared by the [place] FDA and has been authorized for detection and/or diagnosis of SARS-CoV-2 by FDA under an Emergency Use Authorization (EUA). This EUA will remain in effect (meaning this test can be used) for the duration of the COVID-19 declaration under Section 564(b)(1) of the Act, 21 U.S.C. section 360bbb-3(b)(1), unless the authorization is terminated or revoked.  Performed at [facility], [place] [address], [place], [place] [address]   Culture, blood (routine x 2)     Status: None (Preliminary result)   Collection Time: 08/03/20  6:43 AM   Specimen: BLOOD LEFT HAND  Result Value Ref Range Status   Specimen Description BLOOD LEFT HAND  Final   Special Requests   Final    BOTTLES DRAWN AEROBIC ONLY Blood Culture results may not be optimal due to an inadequate volume of blood received in culture bottles   Culture   Final    NO GROWTH 2 DAYS Performed at [facility], [place] [address] [place], [place] [address]    Report Status PENDING  Incomplete  Culture, blood (routine  x 2)     Status: None (Preliminary result)   Collection Time: 08/03/20  6:53 AM   Specimen: BLOOD LEFT HAND  Result Value Ref Range Status   Specimen Description BLOOD LEFT HAND  Final   Special Requests   Final    BOTTLES DRAWN AEROBIC ONLY Blood Culture results may not be optimal due to an inadequate volume of blood received in culture bottles   Culture   Final    NO GROWTH 2 DAYS Performed at [facility], [place] [address], [place], [place] [address]    Report Status PENDING  Incomplete     Labs: Basic Metabolic Panel: Recent Labs  Lab 08/02/20 2200 08/03/20 0040 08/03/20 0654 08/04/20 0759 08/04/20 1502 08/04/20 2120 08/05/20 0635  NA 133*  --  138 136  --   --  137  K >7.5*  --  4.5 6.1* 6.3* 6.4* 4.3  CL 92*  --  99 97*  --   --  98  CO2 22  --  26 28  --   --  31  GLUCOSE 114*  --  85 117*  --   --  92  BUN 147*  --  61* 83*  --   --  39*  CREATININE 17.19*  --  9.23* 12.21*  --   --  7.56*  CALCIUM 9.1  --  9.3 8.5*  --   --  8.2*  MG  --  3.6* 2.6*  --   --   --   --   PHOS  --  11.6* 6.3* 10.3*  --   --  5.1*   Liver Function Tests: Recent Labs  Lab 08/03/20 0040 08/03/20 0654 08/04/20 0759 08/05/20 0635  AST 68* 14*  --   --   ALT 13 13  --   --   ALKPHOS 30* 41  --   --   BILITOT 0.8 1.0  --   --   PROT 6.2* 7.1  --   --   ALBUMIN 3.3* 3.7 3.1* 3.5   No results for input(s): LIPASE, AMYLASE in the last 168 hours. No results for input(s): AMMONIA in the last 168 hours. CBC: Recent Labs  Lab 08/02/20 2200 08/03/20 0654 08/04/20 0759  WBC 4.8 3.9* 4.5  NEUTROABS 3.4 2.3 2.6  HGB 10.8* 10.7* 9.9*  HCT 31.7* 30.8* 30.3*  MCV 90.8 89.8 92.9  PLT 244 205 195   Cardiac Enzymes: Recent Labs  Lab 08/03/20 0040  CKTOTAL 174   BNP: BNP (last 3 results) Recent Labs    07/17/20 1201 08/02/20 2200  BNP 3,654.1* 1,046.1*    ProBNP (last 3 results) No results for input(s): PROBNP in the last 8760 hours.  CBG: Recent Labs  Lab  08/02/20 2359  GLUCAP 111*       Signed:  Alma Friendly, MD [facility] 08/06/2020, 11:43 AM

## 2020-08-08 DIAGNOSIS — N186 End stage renal disease: Secondary | ICD-10-CM | POA: Diagnosis not present

## 2020-08-08 DIAGNOSIS — N2581 Secondary hyperparathyroidism of renal origin: Secondary | ICD-10-CM | POA: Diagnosis not present

## 2020-08-08 DIAGNOSIS — Z992 Dependence on renal dialysis: Secondary | ICD-10-CM | POA: Diagnosis not present

## 2020-08-09 DIAGNOSIS — Z992 Dependence on renal dialysis: Secondary | ICD-10-CM | POA: Diagnosis not present

## 2020-08-09 DIAGNOSIS — N186 End stage renal disease: Secondary | ICD-10-CM | POA: Diagnosis not present

## 2020-08-09 DIAGNOSIS — N2581 Secondary hyperparathyroidism of renal origin: Secondary | ICD-10-CM | POA: Diagnosis not present

## 2020-08-09 LAB — CULTURE, BLOOD (ROUTINE X 2)
Culture: NO GROWTH
Culture: NO GROWTH

## 2020-08-12 DIAGNOSIS — N186 End stage renal disease: Secondary | ICD-10-CM | POA: Diagnosis not present

## 2020-08-12 DIAGNOSIS — N2581 Secondary hyperparathyroidism of renal origin: Secondary | ICD-10-CM | POA: Diagnosis not present

## 2020-08-12 DIAGNOSIS — Z992 Dependence on renal dialysis: Secondary | ICD-10-CM | POA: Diagnosis not present

## 2020-08-14 DIAGNOSIS — Z992 Dependence on renal dialysis: Secondary | ICD-10-CM | POA: Diagnosis not present

## 2020-08-14 DIAGNOSIS — N186 End stage renal disease: Secondary | ICD-10-CM | POA: Diagnosis not present

## 2020-08-14 DIAGNOSIS — N2581 Secondary hyperparathyroidism of renal origin: Secondary | ICD-10-CM | POA: Diagnosis not present

## 2020-08-16 DIAGNOSIS — N186 End stage renal disease: Secondary | ICD-10-CM | POA: Diagnosis not present

## 2020-08-16 DIAGNOSIS — N2581 Secondary hyperparathyroidism of renal origin: Secondary | ICD-10-CM | POA: Diagnosis not present

## 2020-08-16 DIAGNOSIS — Z992 Dependence on renal dialysis: Secondary | ICD-10-CM | POA: Diagnosis not present

## 2020-08-21 DIAGNOSIS — Z992 Dependence on renal dialysis: Secondary | ICD-10-CM | POA: Diagnosis not present

## 2020-08-21 DIAGNOSIS — N186 End stage renal disease: Secondary | ICD-10-CM | POA: Diagnosis not present

## 2020-08-21 DIAGNOSIS — N2581 Secondary hyperparathyroidism of renal origin: Secondary | ICD-10-CM | POA: Diagnosis not present

## 2020-08-22 ENCOUNTER — Ambulatory Visit (INDEPENDENT_AMBULATORY_CARE_PROVIDER_SITE_OTHER): Payer: Medicare HMO | Admitting: Internal Medicine

## 2020-08-22 ENCOUNTER — Other Ambulatory Visit: Payer: Self-pay

## 2020-08-22 ENCOUNTER — Encounter: Payer: Self-pay | Admitting: Internal Medicine

## 2020-08-22 VITALS — BP 152/88 | HR 78 | Resp 16 | Ht 74.0 in | Wt 169.0 lb

## 2020-08-22 DIAGNOSIS — Z992 Dependence on renal dialysis: Secondary | ICD-10-CM | POA: Diagnosis not present

## 2020-08-22 DIAGNOSIS — M549 Dorsalgia, unspecified: Secondary | ICD-10-CM | POA: Diagnosis not present

## 2020-08-22 DIAGNOSIS — M4647 Discitis, unspecified, lumbosacral region: Secondary | ICD-10-CM

## 2020-08-22 DIAGNOSIS — N186 End stage renal disease: Secondary | ICD-10-CM

## 2020-08-22 DIAGNOSIS — G8929 Other chronic pain: Secondary | ICD-10-CM

## 2020-08-22 NOTE — Progress Notes (Signed)
[facility]  Reason for Consult: discitis/om  Referring Provider: [facility] hospitalist   HPI:    Jason [name]. is a 60 y.o. male with PMHx as below who presents to the clinic for hospital follow up for discitis/OM.   He was admitted last month for back pain that improved with supportive care.  MRI during admission showed concerns for possible OM/discitis vs ESRD related spondyloarthropathy.  Blood cx were negative and IR aspirate of the disc space done 7/11 was also negative.  Inflammatory markers were not impressive and his pain improved with out antibiotics.  After discussion with patient we opted for watchful waiting and follow up. Today, he reports continued back pain but it has not been progressively worsening.  He thinks it is due to getting older.  Reports occasionally pain is severe that it causes him to have spasm and legs "give out" on him.  No fevers, chills.  He goes to HD on Tues, Thurs, Sat.  Patient's Medications  New Prescriptions   No medications on file  Previous Medications   AMLODIPINE (NORVASC) 10 MG TABLET    Take 10 mg by mouth every morning.   CARVEDILOL (COREG) 25 MG TABLET    Take 1 tablet (25 mg total) by mouth 2 (two) times daily with a meal.   DOXERCALCIFEROL (HECTOROL) 0.5 MCG CAPSULE    Doxercalciferol (Hectorol)   HYDRALAZINE (APRESOLINE) 100 MG TABLET    Take 1 tablet (100 mg total) by mouth 3 (three) times daily.   HYDROCODONE-ACETAMINOPHEN (NORCO/VICODIN) 5-325 MG TABLET    Take 1 tablet by mouth every 6 (six) hours as needed for severe pain.   IRON SUCROSE IN SODIUM CHLORIDE 0.9 % 100 ML    Iron Sucrose (Venofer)   METHOCARBAMOL (ROBAXIN) 500 MG TABLET    Take 1 tablet (500 mg total) by mouth every 8 (eight) hours as needed for muscle spasms.   METHOXY PEG-EPOETIN BETA (MIRCERA IJ)    Mircera   MULTIVITAMIN (RENA-VIT) TABS TABLET    Take 1 tablet by mouth every morning.   POLYVINYL ALCOHOL (LIQUIFILM TEARS) 1.4 %  OPHTHALMIC SOLUTION    Place 1 drop into both eyes daily as needed for dry eyes.   SEVELAMER CARBONATE (RENVELA) 800 MG TABLET    Take 2,400-4,000 mg by mouth See admin instructions. Take 3-5 tablets (2400-4000 mg) by mouth with big meals - up to three times daily  Modified Medications   No medications on file  Discontinued Medications   No medications on file      Past Medical History:  Diagnosis Date   A-V fistula (HCC)    Right arm   Abdominal hernia    AKI (acute kidney injury) ([place]) 01/2015   Anasarca    Anemia    Asthma    as a child   Bilateral inguinal hernia    Bilateral pleural effusion 01/2017   Cellulitis 01/16/2017   Bilateral lower extremity   CHF (congestive heart failure) ([place])    Chronic venous insufficiency    Concentric left ventricular hypertrophy 08/17/2017   Moderated, noted on ECHO   Diastolic dysfunction 36/46/8032   noted on ECHO   Elevated LFTs    per patient, resolved   End stage renal disease [facility])    Dialysis T/Th/Sa  Started 02/2017/pt is waiting for a kidney transplant   H/O right inguinal hernia repair 11/2017   History of colonoscopy 03/20/2018   History of elevated PSA 10/2017  Hypertension    Pneumonia    Pulmonary hypertension (HCC)    Moderate   Venous stasis ulcer (HCC)    bil legs/ healed up now per pt (03/06/18)    Social History   Tobacco Use   Smoking status: Former    Years: 17.00    Types: Cigarettes    Quit date: 1997    Years since quitting: 25.6   Smokeless tobacco: Never   Tobacco comments:    quit smoking in 1997  Vaping Use   Vaping Use: Never used  Substance Use Topics   Alcohol use: Not Currently    Comment: every several months, rare   Drug use: Not Currently    Comment: marijuana    Family History  Problem Relation Age of Onset   Heart attack Mother 73       CABG hx   Heart disease Mother    Hypertension Mother    Stroke Mother    Diabetes Father    Healthy Brother    Prostate cancer Maternal  Grandfather    Colon cancer Neg Hx    Colon polyps Neg Hx    Esophageal cancer Neg Hx    Stomach cancer Neg Hx    Rectal cancer Neg Hx     Allergies  Allergen Reactions   Lisinopril Cough    Review of Systems  Constitutional: Negative.   Respiratory: Negative.    Cardiovascular: Negative.   Musculoskeletal:  Positive for back pain.     OBJECTIVE:    Vitals:   08/22/20 1017  BP: (!) 152/88  Pulse: 78  Resp: 16  SpO2: 98%  Weight: 169 lb (76.7 kg)  Height: _0  (1.88 m)     Body mass index is 21.7 kg/m.  Physical Exam Constitutional:      General: He is not in acute distress.    Appearance: Normal appearance.  HENT:     Head: Normocephalic and atraumatic.  Eyes:     Extraocular Movements: Extraocular movements intact.     Conjunctiva/sclera: Conjunctivae normal.  Pulmonary:     Effort: Pulmonary effort is normal. No respiratory distress.  Musculoskeletal:        General: No swelling or tenderness. Normal range of motion.  Skin:    General: Skin is warm and dry.     Findings: No rash.  Neurological:     General: No focal deficit present.     Mental Status: He is alert and oriented to person, place, and time.  Psychiatric:        Mood and Affect: Mood normal.        Behavior: Behavior normal.     Labs and Microbiology:  CBC Latest Ref Rng & Units 08/04/2020 08/03/2020 08/02/2020  WBC 4.0 - 10.5 K/uL 4.5 3.9(L) 4.8  Hemoglobin 13.0 - 17.0 g/dL 9.9(L) 10.7(L) 10.8(L)  Hematocrit 39.0 - 52.0 % 30.3(L) 30.8(L) 31.7(L)  Platelets 150 - 400 K/uL 195 205 244   CMP Latest Ref Rng & Units 08/05/2020 08/04/2020 08/04/2020  Glucose 70 - 99 mg/dL 92 - -  BUN 6 - 20 mg/dL 39(H) - -  Creatinine 0.61 - 1.24 mg/dL 7.56(H) - -  Sodium 135 - 145 mmol/L 137 - -  Potassium 3.5 - 5.1 mmol/L 4.3 6.4(HH) 6.3(HH)  Chloride 98 - 111 mmol/L 98 - -  CO2 22 - 32 mmol/L 31 - -  Calcium 8.9 - 10.3 mg/dL 8.2(L) - -  Total Protein 6.5 - 8.1 g/dL - - -  Total Bilirubin 0.3 - 1.2  mg/dL - - -  Alkaline Phos 38 - 126 U/L - - -  AST 15 - 41 U/L - - -  ALT 0 - 44 U/L - - -     No results found for this or any previous visit (from the past 240 hour(s)).  Imaging: IMPRESSION: 1. Nonspecific, but suspicious, disc and endplate changes at X9-B7 as described. Findings could relate to dialysis-related spondyloarthropathy, although the paraspinal inflammatory changes are moderately suspicious for diskitis and early osteomyelitis. Consider disc space aspiration and/or short-term MR follow-up without and with contrast. 2. No focal fluid collection or epidural inflammation identified. 3. Moderate foraminal narrowing bilaterally at L5-S1. 4. No other significant disc space findings.   ASSESSMENT & PLAN:    1. Discitis of lumbosacral region 2. ESRD (end stage renal disease) on dialysis ([place]) 3. Chronic bilateral back pain  Patient with possible OM/discitis vs ESRD related spondyloarthropathy.  During his July admission, his blood cx were negative and IR disc space aspirate was negative as well.  ESR/CRP not very remarkable and his back pain has been non-progressive despite several weeks of not having had antibiotics.  He does not have any systemic symptoms c/w infection either.  Will obtain short interval MRI follow up and repeat inflammatory markers.  If all is stable, likely not infectious and would recommend orthopedic follow up for his back pain.  RTC 3 weeks to discuss results.      [facility] 08/22/2020, 10:32 AM

## 2020-08-22 NOTE — Patient Instructions (Signed)
Thank you for coming to see me today. It was a pleasure seeing you.  To Do: Labs today MRI low back to re-assess for infection Follow up in 3 weeks  If you have any questions or concerns, please do not hesitate to call the office at [phone].  Take Care,   Jule Ser, DO

## 2020-08-23 LAB — SEDIMENTATION RATE: Sed Rate: 9 mm/h (ref 0–20)

## 2020-08-23 LAB — C-REACTIVE PROTEIN: CRP: 2.1 mg/L (ref <8.0)

## 2020-08-25 ENCOUNTER — Telehealth: Payer: Self-pay

## 2020-08-25 NOTE — Telephone Encounter (Signed)
Spoke with Colletta Maryland at [facility]. MRI was approved from 08/29/20-09/28/20. Authorization # [id number]. Colletta Maryland to fax approval information to front office.   Beryle Flock, RN

## 2020-08-29 ENCOUNTER — Ambulatory Visit (HOSPITAL_COMMUNITY): Admission: RE | Admit: 2020-08-29 | Payer: Medicare HMO | Source: Ambulatory Visit

## 2020-08-30 ENCOUNTER — Inpatient Hospital Stay (HOSPITAL_COMMUNITY)
Admission: EM | Admit: 2020-08-30 | Discharge: 2020-09-01 | DRG: 304 | Disposition: A | Discharge status: Home / Self Care | Payer: Medicare HMO | Attending: Family Medicine | Admitting: Family Medicine

## 2020-08-30 ENCOUNTER — Other Ambulatory Visit: Payer: Self-pay

## 2020-08-30 ENCOUNTER — Emergency Department (HOSPITAL_COMMUNITY): Payer: Medicare HMO

## 2020-08-30 ENCOUNTER — Encounter (HOSPITAL_COMMUNITY): Payer: Self-pay | Admitting: *Deleted

## 2020-08-30 DIAGNOSIS — Z823 Family history of stroke: Secondary | ICD-10-CM | POA: Diagnosis not present

## 2020-08-30 DIAGNOSIS — R4182 Altered mental status, unspecified: Secondary | ICD-10-CM | POA: Diagnosis not present

## 2020-08-30 DIAGNOSIS — Z8042 Family history of malignant neoplasm of prostate: Secondary | ICD-10-CM | POA: Diagnosis not present

## 2020-08-30 DIAGNOSIS — S0993XA Unspecified injury of face, initial encounter: Secondary | ICD-10-CM | POA: Diagnosis not present

## 2020-08-30 DIAGNOSIS — R7989 Other specified abnormal findings of blood chemistry: Secondary | ICD-10-CM

## 2020-08-30 DIAGNOSIS — I169 Hypertensive crisis, unspecified: Secondary | ICD-10-CM

## 2020-08-30 DIAGNOSIS — D649 Anemia, unspecified: Secondary | ICD-10-CM | POA: Diagnosis not present

## 2020-08-30 DIAGNOSIS — Z833 Family history of diabetes mellitus: Secondary | ICD-10-CM | POA: Diagnosis not present

## 2020-08-30 DIAGNOSIS — Z992 Dependence on renal dialysis: Secondary | ICD-10-CM | POA: Diagnosis not present

## 2020-08-30 DIAGNOSIS — N2581 Secondary hyperparathyroidism of renal origin: Secondary | ICD-10-CM | POA: Diagnosis present

## 2020-08-30 DIAGNOSIS — I872 Venous insufficiency (chronic) (peripheral): Secondary | ICD-10-CM | POA: Diagnosis present

## 2020-08-30 DIAGNOSIS — Y92009 Unspecified place in unspecified non-institutional (private) residence as the place of occurrence of the external cause: Secondary | ICD-10-CM | POA: Diagnosis not present

## 2020-08-30 DIAGNOSIS — I5033 Acute on chronic diastolic (congestive) heart failure: Secondary | ICD-10-CM | POA: Diagnosis present

## 2020-08-30 DIAGNOSIS — I120 Hypertensive chronic kidney disease with stage 5 chronic kidney disease or end stage renal disease: Secondary | ICD-10-CM | POA: Diagnosis not present

## 2020-08-30 DIAGNOSIS — E8889 Other specified metabolic disorders: Secondary | ICD-10-CM | POA: Diagnosis present

## 2020-08-30 DIAGNOSIS — I248 Other forms of acute ischemic heart disease: Secondary | ICD-10-CM | POA: Diagnosis present

## 2020-08-30 DIAGNOSIS — Z20822 Contact with and (suspected) exposure to covid-19: Secondary | ICD-10-CM | POA: Diagnosis present

## 2020-08-30 DIAGNOSIS — Z9115 Patient's noncompliance with renal dialysis: Secondary | ICD-10-CM

## 2020-08-30 DIAGNOSIS — R778 Other specified abnormalities of plasma proteins: Secondary | ICD-10-CM

## 2020-08-30 DIAGNOSIS — G9340 Encephalopathy, unspecified: Secondary | ICD-10-CM | POA: Diagnosis not present

## 2020-08-30 DIAGNOSIS — M545 Low back pain, unspecified: Secondary | ICD-10-CM | POA: Diagnosis present

## 2020-08-30 DIAGNOSIS — T465X6A Underdosing of other antihypertensive drugs, initial encounter: Secondary | ICD-10-CM | POA: Diagnosis present

## 2020-08-30 DIAGNOSIS — R0602 Shortness of breath: Secondary | ICD-10-CM | POA: Diagnosis not present

## 2020-08-30 DIAGNOSIS — I509 Heart failure, unspecified: Secondary | ICD-10-CM

## 2020-08-30 DIAGNOSIS — Z888 Allergy status to other drugs, medicaments and biological substances status: Secondary | ICD-10-CM | POA: Diagnosis not present

## 2020-08-30 DIAGNOSIS — I161 Hypertensive emergency: Principal | ICD-10-CM | POA: Diagnosis present

## 2020-08-30 DIAGNOSIS — R0902 Hypoxemia: Secondary | ICD-10-CM | POA: Diagnosis not present

## 2020-08-30 DIAGNOSIS — I132 Hypertensive heart and chronic kidney disease with heart failure and with stage 5 chronic kidney disease, or end stage renal disease: Secondary | ICD-10-CM | POA: Diagnosis present

## 2020-08-30 DIAGNOSIS — Z87891 Personal history of nicotine dependence: Secondary | ICD-10-CM

## 2020-08-30 DIAGNOSIS — D631 Anemia in chronic kidney disease: Secondary | ICD-10-CM | POA: Diagnosis present

## 2020-08-30 DIAGNOSIS — I452 Bifascicular block: Secondary | ICD-10-CM | POA: Diagnosis present

## 2020-08-30 DIAGNOSIS — G9341 Metabolic encephalopathy: Secondary | ICD-10-CM | POA: Diagnosis present

## 2020-08-30 DIAGNOSIS — D6489 Other specified anemias: Secondary | ICD-10-CM | POA: Diagnosis present

## 2020-08-30 DIAGNOSIS — N186 End stage renal disease: Secondary | ICD-10-CM | POA: Diagnosis present

## 2020-08-30 DIAGNOSIS — E875 Hyperkalemia: Secondary | ICD-10-CM | POA: Diagnosis present

## 2020-08-30 DIAGNOSIS — I272 Pulmonary hypertension, unspecified: Secondary | ICD-10-CM | POA: Diagnosis present

## 2020-08-30 DIAGNOSIS — I5030 Unspecified diastolic (congestive) heart failure: Secondary | ICD-10-CM | POA: Diagnosis not present

## 2020-08-30 DIAGNOSIS — R0600 Dyspnea, unspecified: Secondary | ICD-10-CM | POA: Diagnosis not present

## 2020-08-30 DIAGNOSIS — J9601 Acute respiratory failure with hypoxia: Secondary | ICD-10-CM | POA: Diagnosis present

## 2020-08-30 DIAGNOSIS — I517 Cardiomegaly: Secondary | ICD-10-CM | POA: Diagnosis not present

## 2020-08-30 DIAGNOSIS — Z8249 Family history of ischemic heart disease and other diseases of the circulatory system: Secondary | ICD-10-CM

## 2020-08-30 DIAGNOSIS — E8779 Other fluid overload: Secondary | ICD-10-CM

## 2020-08-30 DIAGNOSIS — N250 Renal osteodystrophy: Secondary | ICD-10-CM | POA: Diagnosis not present

## 2020-08-30 LAB — BASIC METABOLIC PANEL
Anion gap: 24 — ABNORMAL HIGH (ref 5–15)
BUN: 162 mg/dL — ABNORMAL HIGH (ref 6–20)
CO2: 17 mmol/L — ABNORMAL LOW (ref 22–32)
Calcium: 7.8 mg/dL — ABNORMAL LOW (ref 8.9–10.3)
Chloride: 96 mmol/L — ABNORMAL LOW (ref 98–111)
Creatinine, Ser: 24.64 mg/dL — ABNORMAL HIGH (ref 0.61–1.24)
GFR, Estimated: 2 mL/min — ABNORMAL LOW (ref >60)
Glucose, Bld: 122 mg/dL — ABNORMAL HIGH (ref 70–99)
Potassium: 6.9 mmol/L (ref 3.5–5.1)
Sodium: 137 mmol/L (ref 135–145)

## 2020-08-30 LAB — CBC
HCT: 25.9 % — ABNORMAL LOW (ref 39.0–52.0)
Hemoglobin: 8.7 g/dL — ABNORMAL LOW (ref 13.0–17.0)
MCH: 30.4 pg (ref 26.0–34.0)
MCHC: 33.6 g/dL (ref 30.0–36.0)
MCV: 90.6 fL (ref 80.0–100.0)
Platelets: 183 10*3/uL (ref 150–400)
RBC: 2.86 MIL/uL — ABNORMAL LOW (ref 4.22–5.81)
RDW: 13.6 % (ref 11.5–15.5)
WBC: 6.4 10*3/uL (ref 4.0–10.5)
nRBC: 0 % (ref 0.0–0.2)

## 2020-08-30 LAB — BRAIN NATRIURETIC PEPTIDE: B Natriuretic Peptide: 3564.1 pg/mL — ABNORMAL HIGH (ref 0.0–100.0)

## 2020-08-30 LAB — RESP PANEL BY RT-PCR (FLU A&B, COVID) ARPGX2
Influenza A by PCR: NEGATIVE
Influenza B by PCR: NEGATIVE
SARS Coronavirus 2 by RT PCR: NEGATIVE

## 2020-08-30 LAB — TROPONIN I (HIGH SENSITIVITY): Troponin I (High Sensitivity): 115 ng/L (ref <18)

## 2020-08-30 IMAGING — DX DG CHEST 1V PORT
1 series · 1 of 1 positions shown · non-contrast
Comparison: [DATE].

CLINICAL DATA: The pt is a dialysius pt that has been on the road
and has not had dialysis since last week very sob back pain 02 sats
low nasal 02 in triage

EXAM:
PORTABLE CHEST 1 VIEW

[chest]
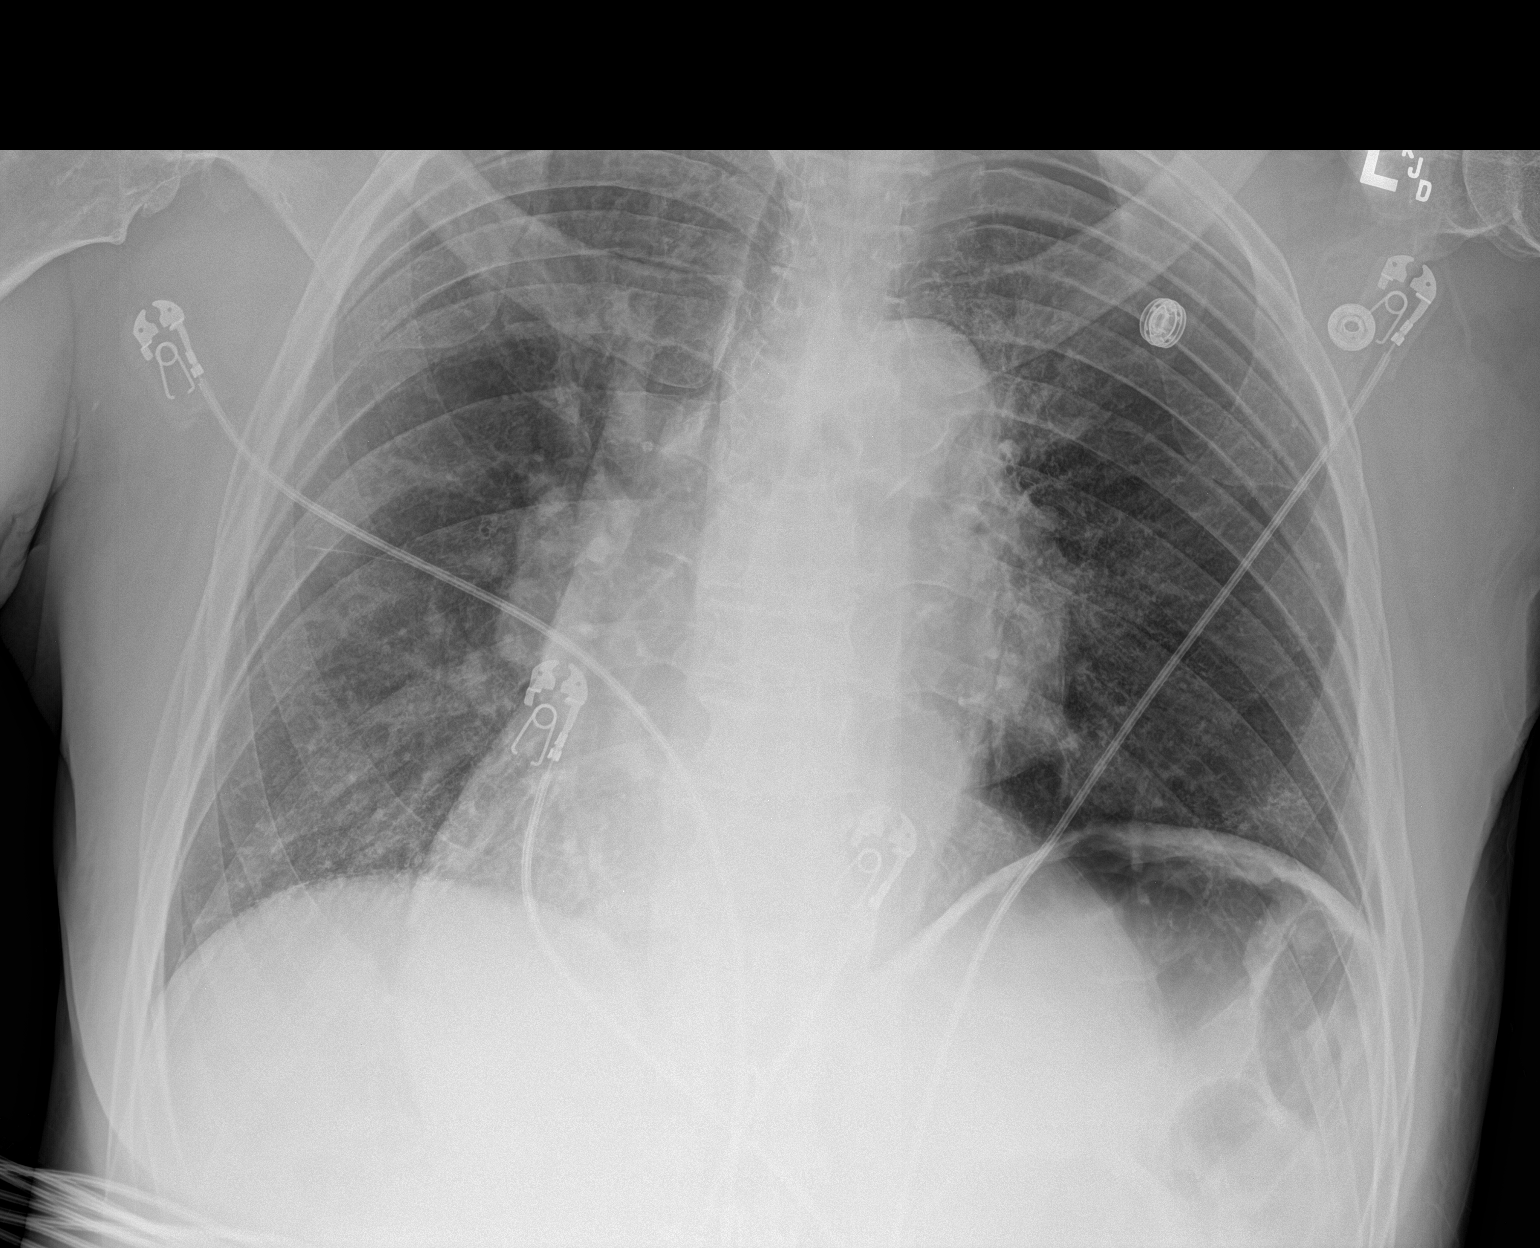

[1 of 1 positions shown; findings below may reference images not displayed]

FINDINGS: Mild enlargement of the cardiac silhouette, stable. No mediastinal
or hilar masses.

Clear lungs.  No pleural effusion or pneumothorax.

Skeletal structures are grossly intact.
IMPRESSION: No acute cardiopulmonary disease.

## 2020-08-30 MED ORDER — CLONIDINE HCL 0.1 MG PO TABS
0.1000 mg | ORAL_TABLET | Freq: Once | ORAL | Status: AC
  Administered 2020-08-31: 0.1 mg via ORAL
  Filled 2020-08-30: qty 1

## 2020-08-30 MED ORDER — SODIUM ZIRCONIUM CYCLOSILICATE 10 G PO PACK
10.0000 g | PACK | Freq: Once | ORAL | Status: AC
  Administered 2020-08-30: 10 g via ORAL
  Filled 2020-08-30: qty 1

## 2020-08-30 MED ORDER — CHLORHEXIDINE GLUCONATE CLOTH 2 % EX PADS
6.0000 | MEDICATED_PAD | Freq: Every day | CUTANEOUS | Status: DC
  Administered 2020-08-31 – 2020-09-01 (×2): 6 via TOPICAL

## 2020-08-30 MED ORDER — HYDRALAZINE HCL 20 MG/ML IJ SOLN
10.0000 mg | Freq: Once | INTRAMUSCULAR | Status: AC
  Administered 2020-08-30: 10 mg via INTRAVENOUS
  Filled 2020-08-30: qty 1

## 2020-08-30 MED ORDER — FENTANYL CITRATE PF 50 MCG/ML IJ SOSY: 50.0000 ug | PREFILLED_SYRINGE | Freq: Once | INTRAMUSCULAR | Status: DC

## 2020-08-30 NOTE — ED Notes (Signed)
Pt reports missing 3 dialysis sessions. C/o sob, and back pain

## 2020-08-30 NOTE — ED Provider Notes (Signed)
[facility] EMERGENCY DEPARTMENT Provider Note   CSN: [id number] Arrival date & time: 08/30/20  7425     History Chief Complaint  Patient presents with   Shortness of Breath    Jason [name]. is a 59 y.o. male.  60 year old male with prior medical history as detailed below presents with complaint of shortness of breath.  Patient reports that he missed dialysis for the last week.  Patient reports increased shortness of breath over the last 12 hours.  He denies fever or chills.  He denies chest pain.  He also reports noncompliance with his prescribed antihypertensives.  The history is provided by the patient.  Shortness of Breath Severity:  Moderate Onset quality:  Gradual Duration:  12 hours Timing:  Constant Progression:  Worsening Chronicity:  Recurrent     Past Medical History:  Diagnosis Date   A-V fistula (HCC)    Right arm   Abdominal hernia    AKI (acute kidney injury) ([place]) 01/2015   Anasarca    Anemia    Asthma    as a child   Bilateral inguinal hernia    Bilateral pleural effusion 01/2017   Cellulitis 01/16/2017   Bilateral lower extremity   CHF (congestive heart failure) (HCC)    Chronic venous insufficiency    Concentric left ventricular hypertrophy 08/17/2017   Moderated, noted on ECHO   Diastolic dysfunction 95/63/8756   noted on ECHO   Elevated LFTs    per patient, resolved   End stage renal disease ([place])    Dialysis T/Th/Sa  Started 02/2017/pt is waiting for a kidney transplant   H/O right inguinal hernia repair 11/2017   History of colonoscopy 03/20/2018   History of elevated PSA 10/2017   Hypertension    Pneumonia    Pulmonary hypertension ([place])    Moderate   Venous stasis ulcer ([place])    bil legs/ healed up now per pt (03/06/18)    Patient Active Problem List   Diagnosis Date Noted   Acute respiratory failure with hypoxia ([place]) 08/03/2020   Acute hypoxemic respiratory failure ([place]) 07/19/2020   Hyperkalemia,  diminished renal excretion 07/17/2020   Volume overload 07/17/2020   Acute encephalopathy 10/30/2019   Mediastinal mass 05/21/2019   Hyperkalemia 04/26/2019   Chronic combined systolic and diastolic congestive heart failure ([place]) 04/26/2019   Hypertensive urgency 02/17/2019   Hypertensive emergency 02/17/2019   Leg edema, left 02/17/2019   Bilateral impacted cerumen 08/24/2018   History of elevated PSA 08/24/2018   Hemodialysis patient ([place]) 05/28/2018   Vitamin D deficiency 05/28/2018   Left inguinal hernia s/p lap repair w mesh 11/18/2017 11/18/2017   Scrotal hernia, right, s/p lap repair w mesh 11/18/2017 05/30/2017   Arteriovenous fistula (Right arm) for dialysis 43/32/9518   Umbilical hernia s/p primary repair 11/18/2017 05/30/2017   Anasarca 02/18/2017   ESRD (end stage renal disease) on dialysis [facility])    NSVT (nonsustained ventricular tachycardia) ([place])    Evaluation by psychiatric service required 01/19/2017   Elevated troponin level not due myocardial infarction 01/16/2017   Acute CHF (congestive heart failure) ([place]) 01/16/2017   Venous stasis ulcer ([name]) 01/16/2017   Essential hypertension 02/26/2015   Abnormal EKG 01/30/2015   Anemia of chronic disease 01/29/2015    Past Surgical History:  Procedure Laterality Date   AV FISTULA PLACEMENT Right 01/20/2017   Procedure: ARTERIOVENOUS (AV) FISTULA CREATION;  Surgeon: Angelia Mould, MD;  Location: [facility];  Service: Vascular;  Laterality: Right;  HERNIA REPAIR     INSERTION OF DIALYSIS CATHETER Right 01/20/2017   Procedure: INSERTION OF DIALYSIS CATHETER;  Surgeon: Angelia Mould, MD;  Location: [place] [facility];  Service: Vascular;  Laterality: Right;   INSERTION OF MESH N/A 11/18/2017   Procedure: INSERTION OF MESH;  Surgeon: Michael Boston, MD;  Location: [facility];  Service: General;  Laterality: N/A;   IR FLUORO GUIDE CV LINE RIGHT  01/18/2017   IR LUMBAR DISC ASPIRATION W/IMG GUIDE  07/21/2020   IR US GUIDE VASC ACCESS  RIGHT  01/18/2017   LAPAROSCOPIC INGUINAL HERNIA WITH UMBILICAL HERNIA Bilateral 11/18/2017   Procedure: LAPAROSCOPIC BILATERAL INGUINAL HERNIA REPAIR WITH UMBILICAL HERNIA REPAIR WITH MESH;  Surgeon: Michael Boston, MD;  Location: [facility];  Service: General;  Laterality: Bilateral;   TOE SURGERY     right fracture in big toe   TONSILLECTOMY         Family History  Problem Relation Age of Onset   Heart attack Mother 61       CABG hx   Heart disease Mother    Hypertension Mother    Stroke Mother    Diabetes Father    Healthy Brother    Prostate cancer Maternal Grandfather    Colon cancer Neg Hx    Colon polyps Neg Hx    Esophageal cancer Neg Hx    Stomach cancer Neg Hx    Rectal cancer Neg Hx     Social History   Tobacco Use   Smoking status: Former    Years: 17.00    Types: Cigarettes    Quit date: 1997    Years since quitting: 25.6   Smokeless tobacco: Never   Tobacco comments:    quit smoking in 1997  Vaping Use   Vaping Use: Never used  Substance Use Topics   Alcohol use: Not Currently    Comment: every several months, rare   Drug use: Not Currently    Comment: marijuana    Home Medications Prior to Admission medications   Medication Sig Start Date End Date Taking? Authorizing Provider  amLODipine (NORVASC) 10 MG tablet Take 10 mg by mouth every morning. 06/20/18   [provider]  carvedilol (COREG) 25 MG tablet Take 1 tablet (25 mg total) by mouth 2 (two) times daily with a meal. 02/02/18   [name], FNP  doxercalciferol (HECTOROL) 0.5 MCG capsule Doxercalciferol (Hectorol) 02/21/20 02/19/21  [provider]  hydrALAZINE (APRESOLINE) 100 MG tablet Take 1 tablet (100 mg total) by mouth 3 (three) times daily. Patient taking differently: Take 100 mg by mouth 2 (two) times daily. 11/03/19 08/22/20  Darliss Cheney, MD  HYDROcodone-acetaminophen (NORCO/VICODIN) 5-325 MG tablet Take 1 tablet by mouth every 6 (six) hours as needed for severe  pain. Patient not taking: Reported on 08/22/2020 07/22/20   Caren Griffins, MD  iron sucrose in sodium chloride 0.9 % 100 mL Iron Sucrose (Venofer) 02/26/20 02/17/21  [provider]  methocarbamol (ROBAXIN) 500 MG tablet Take 1 tablet (500 mg total) by mouth every 8 (eight) hours as needed for muscle spasms. Patient not taking: Reported on 08/22/2020 07/22/20   Caren Griffins, MD  Methoxy PEG-Epoetin Beta (MIRCERA IJ) Mircera 02/12/20 02/10/21  [provider]  multivitamin (RENA-VIT) TABS tablet Take 1 tablet by mouth every morning. 10/20/17   [provider]  polyvinyl alcohol (LIQUIFILM TEARS) 1.4 % ophthalmic solution Place 1 drop into both eyes daily as needed for dry eyes.    [provider]  sevelamer carbonate (RENVELA) 800 MG tablet Take 2,400-4,000 mg by mouth See admin instructions. Take 3-5 tablets (2400-4000 mg) by mouth with big meals - up to three times daily 01/15/20   [provider]    Allergies    Lisinopril  Review of Systems   Review of Systems  Respiratory:  Positive for shortness of breath.   All other systems reviewed and are negative.  Physical Exam Updated Vital Signs BP (!) 218/119 (BP Location: Left Arm)   Pulse 74   Temp 97.9 F (36.6 C) (Oral)   Resp (!) 31   Ht 6\' 2"  (1.88 m)   Wt 76.7 kg   SpO2 93%   BMI 21.71 kg/m   Physical Exam Vitals and nursing note reviewed.  Constitutional:      General: He is not in acute distress.    Appearance: Normal appearance. He is well-developed.  HENT:     Head: Normocephalic and atraumatic.  Eyes:     Conjunctiva/sclera: Conjunctivae normal.     Pupils: Pupils are equal, round, and reactive to light.  Cardiovascular:     Rate and Rhythm: Normal rate and regular rhythm.     Heart sounds: Normal heart sounds.  Pulmonary:     Effort: Pulmonary effort is normal. No respiratory distress.     Breath sounds: Normal breath sounds.  Abdominal:     General: There is no  distension.     Palpations: Abdomen is soft.     Tenderness: There is no abdominal tenderness.  Musculoskeletal:        General: No deformity. Normal range of motion.     Cervical back: Normal range of motion and neck supple.  Skin:    General: Skin is warm and dry.  Neurological:     General: No focal deficit present.     Mental Status: He is alert and oriented to person, place, and time.    ED Results / Procedures / Treatments   Labs (all labs ordered are listed, but only abnormal results are displayed) Labs Reviewed  BASIC METABOLIC PANEL - Abnormal; Notable for the following components:      Result Value   Potassium 6.9 (*)    Chloride 96 (*)    CO2 17 (*)    Glucose, Bld 122 (*)    BUN 162 (*)    Creatinine, Ser 24.64 (*)    Calcium 7.8 (*)    GFR, Estimated 2 (*)    Anion gap 24 (*)    All other components within normal limits  CBC - Abnormal; Notable for the following components:   RBC 2.86 (*)    Hemoglobin 8.7 (*)    HCT 25.9 (*)    All other components within normal limits  BRAIN NATRIURETIC PEPTIDE - Abnormal; Notable for the following components:   B Natriuretic Peptide 3,564.1 (*)    All other components within normal limits  TROPONIN I (HIGH SENSITIVITY) - Abnormal; Notable for the following components:   Troponin I (High Sensitivity) 115 (*)    All other components within normal limits  RESP PANEL BY RT-PCR (FLU A&B, COVID) [id number]    EKG EKG Interpretation  Date/Time:  Saturday August 30 2020 19:27:34 EDT Ventricular Rate:  79 PR Interval:  154 QRS Duration: 120 QT Interval:  428 QTC Calculation: 490 R Axis:   -59 Text Interpretation: Normal sinus rhythm Right bundle branch block Left anterior fascicular block Abnormal ECG Confirmed by Dene Gentry [id number]) on 08/30/2020  7:53:59 PM  Radiology DG Chest Port 1 View  Result Date: 08/30/2020 CLINICAL DATA:  The pt is a dialysius pt that has been on the road and has not had dialysis since last week  very sob back pain 02 sats low nasal 02 in triage EXAM: PORTABLE CHEST 1 VIEW COMPARISON:  08/02/2020. FINDINGS: Mild enlargement of the cardiac silhouette, stable. No mediastinal or hilar masses. Clear lungs.  No pleural effusion or pneumothorax. Skeletal structures are grossly intact. IMPRESSION: No acute cardiopulmonary disease. Electronically Signed   By: Lajean Manes M.D.   On: 08/30/2020 19:58    Procedures Procedures   Medications Ordered in ED Medications  sodium zirconium cyclosilicate (LOKELMA) packet 10 g (has no administration in time range)  Chlorhexidine Gluconate Cloth 2 % PADS 6 each (has no administration in time range)  hydrALAZINE (APRESOLINE) injection 10 mg (10 mg Intravenous Given 08/30/20 1957)    ED Course  I have reviewed the triage vital signs and the nursing notes.  Pertinent labs & imaging results that were available during my care of the patient were reviewed by me and considered in my medical decision making (see chart for details).    MDM Rules/Calculators/A&P                           MDM  MSE complete  Jason [name]. was evaluated in Emergency Department on 08/30/2020 for the symptoms described in the history of present illness. He was evaluated in the context of the global COVID-19 pandemic, which necessitated consideration that the patient might be at risk for infection with the SARS-CoV-2 virus that causes COVID-19. Institutional protocols and algorithms that pertain to the evaluation of patients at risk for COVID-19 are in a state of rapid change based on information released by regulatory bodies including the CDC and federal and state organizations. These policies and algorithms were followed during the patient's care in the ED.  Patient is presenting with complaint of dyspnea in the setting of recent missed dialysis.  Patient's potassium is also moderately elevated at 6.9.  Patient with mild requirement for supplemental O2.  Case discussed with  Dr. Carolin Sicks of nephrology.  Dr. Carolin Sicks will plan for dialysis this evening.  Dr. Carolin Sicks does not feel that the patient would require admission at this time.  Current plan is for dialysis and then likely discharge from the ED after completion of dialysis session with Dr. Carolin Sicks.  2300 -- He has been taken to dialysis.  Dr. Nicholes Stairs is aware of patient's pending return to the ED and need for re-eval and possible discharge.   Final Clinical Impression(s) / ED Diagnoses Final diagnoses:  ESRD (end stage renal disease) ([place])  Hypertensive crisis  Encephalopathy  Elevated troponin    Rx / DC Orders ED Discharge Orders     None        Valarie Merino, MD 08/31/20 1515

## 2020-08-30 NOTE — ED Triage Notes (Signed)
The pt is a dialysius pt that has been on the road and has not had dialysis since last week  very sob back pain 02 sats low nasal 02 in triage

## 2020-08-30 NOTE — Progress Notes (Signed)
Brief nephrology Obs note: 60 y/o male ESRD on HD, non-adherence with treatment, missed last 3 HD rx, now with hyperkalemia and hypertensive urgency. Plan: Jason [name] Dialysis tonight with low potassium bath and UF 3-4 L as tolerated 3 hours as he usually doesn't stay longer in the treatment He will be transferred to ER after completion of dialysis for reassessment before discharge.  Discussed with EDP Dr Francia Greaves and dialysis nurse [place].

## 2020-08-30 NOTE — ED Notes (Signed)
Pt taken to dialysis 

## 2020-08-30 NOTE — ED Notes (Signed)
Dialysis fistula rt arm

## 2020-08-31 ENCOUNTER — Emergency Department (HOSPITAL_COMMUNITY): Payer: Medicare HMO

## 2020-08-31 ENCOUNTER — Encounter (HOSPITAL_COMMUNITY): Payer: Self-pay | Admitting: Emergency Medicine

## 2020-08-31 DIAGNOSIS — I169 Hypertensive crisis, unspecified: Secondary | ICD-10-CM | POA: Diagnosis present

## 2020-08-31 DIAGNOSIS — N250 Renal osteodystrophy: Secondary | ICD-10-CM | POA: Diagnosis not present

## 2020-08-31 DIAGNOSIS — N186 End stage renal disease: Secondary | ICD-10-CM

## 2020-08-31 DIAGNOSIS — Z9115 Patient's noncompliance with renal dialysis: Secondary | ICD-10-CM | POA: Diagnosis not present

## 2020-08-31 DIAGNOSIS — D631 Anemia in chronic kidney disease: Secondary | ICD-10-CM | POA: Diagnosis present

## 2020-08-31 DIAGNOSIS — I132 Hypertensive heart and chronic kidney disease with heart failure and with stage 5 chronic kidney disease, or end stage renal disease: Secondary | ICD-10-CM | POA: Diagnosis present

## 2020-08-31 DIAGNOSIS — Z823 Family history of stroke: Secondary | ICD-10-CM | POA: Diagnosis not present

## 2020-08-31 DIAGNOSIS — G9341 Metabolic encephalopathy: Secondary | ICD-10-CM | POA: Diagnosis present

## 2020-08-31 DIAGNOSIS — I517 Cardiomegaly: Secondary | ICD-10-CM | POA: Diagnosis not present

## 2020-08-31 DIAGNOSIS — Z87891 Personal history of nicotine dependence: Secondary | ICD-10-CM | POA: Diagnosis not present

## 2020-08-31 DIAGNOSIS — E875 Hyperkalemia: Secondary | ICD-10-CM | POA: Diagnosis present

## 2020-08-31 DIAGNOSIS — R778 Other specified abnormalities of plasma proteins: Secondary | ICD-10-CM | POA: Diagnosis not present

## 2020-08-31 DIAGNOSIS — G9340 Encephalopathy, unspecified: Secondary | ICD-10-CM | POA: Diagnosis not present

## 2020-08-31 DIAGNOSIS — I5033 Acute on chronic diastolic (congestive) heart failure: Secondary | ICD-10-CM | POA: Diagnosis present

## 2020-08-31 DIAGNOSIS — R0902 Hypoxemia: Secondary | ICD-10-CM | POA: Diagnosis not present

## 2020-08-31 DIAGNOSIS — I272 Pulmonary hypertension, unspecified: Secondary | ICD-10-CM | POA: Diagnosis present

## 2020-08-31 DIAGNOSIS — J9601 Acute respiratory failure with hypoxia: Secondary | ICD-10-CM | POA: Diagnosis present

## 2020-08-31 DIAGNOSIS — I161 Hypertensive emergency: Principal | ICD-10-CM

## 2020-08-31 DIAGNOSIS — T465X6A Underdosing of other antihypertensive drugs, initial encounter: Secondary | ICD-10-CM | POA: Diagnosis present

## 2020-08-31 DIAGNOSIS — S0993XA Unspecified injury of face, initial encounter: Secondary | ICD-10-CM | POA: Diagnosis not present

## 2020-08-31 DIAGNOSIS — Z833 Family history of diabetes mellitus: Secondary | ICD-10-CM | POA: Diagnosis not present

## 2020-08-31 DIAGNOSIS — Z8042 Family history of malignant neoplasm of prostate: Secondary | ICD-10-CM | POA: Diagnosis not present

## 2020-08-31 DIAGNOSIS — I452 Bifascicular block: Secondary | ICD-10-CM | POA: Diagnosis present

## 2020-08-31 DIAGNOSIS — Y92009 Unspecified place in unspecified non-institutional (private) residence as the place of occurrence of the external cause: Secondary | ICD-10-CM | POA: Diagnosis not present

## 2020-08-31 DIAGNOSIS — Z8249 Family history of ischemic heart disease and other diseases of the circulatory system: Secondary | ICD-10-CM | POA: Diagnosis not present

## 2020-08-31 DIAGNOSIS — R4182 Altered mental status, unspecified: Secondary | ICD-10-CM | POA: Diagnosis not present

## 2020-08-31 DIAGNOSIS — R0600 Dyspnea, unspecified: Secondary | ICD-10-CM | POA: Diagnosis not present

## 2020-08-31 DIAGNOSIS — N2581 Secondary hyperparathyroidism of renal origin: Secondary | ICD-10-CM | POA: Diagnosis present

## 2020-08-31 DIAGNOSIS — Z888 Allergy status to other drugs, medicaments and biological substances status: Secondary | ICD-10-CM | POA: Diagnosis not present

## 2020-08-31 DIAGNOSIS — E8889 Other specified metabolic disorders: Secondary | ICD-10-CM | POA: Diagnosis present

## 2020-08-31 DIAGNOSIS — Z992 Dependence on renal dialysis: Secondary | ICD-10-CM | POA: Diagnosis not present

## 2020-08-31 DIAGNOSIS — Z20822 Contact with and (suspected) exposure to covid-19: Secondary | ICD-10-CM | POA: Diagnosis present

## 2020-08-31 DIAGNOSIS — D649 Anemia, unspecified: Secondary | ICD-10-CM | POA: Diagnosis not present

## 2020-08-31 DIAGNOSIS — D6489 Other specified anemias: Secondary | ICD-10-CM | POA: Diagnosis present

## 2020-08-31 DIAGNOSIS — I5030 Unspecified diastolic (congestive) heart failure: Secondary | ICD-10-CM | POA: Diagnosis not present

## 2020-08-31 DIAGNOSIS — I248 Other forms of acute ischemic heart disease: Secondary | ICD-10-CM | POA: Diagnosis present

## 2020-08-31 LAB — GLUCOSE, CAPILLARY
Glucose-Capillary: 98 mg/dL (ref 70–99)
Glucose-Capillary: 98 mg/dL (ref 70–99)
Glucose-Capillary: 157 mg/dL — ABNORMAL HIGH (ref 70–99)

## 2020-08-31 LAB — POCT I-STAT 7, (LYTES, BLD GAS, ICA,H+H)
Acid-Base Excess: 4 mmol/L — ABNORMAL HIGH (ref 0.0–2.0)
Bicarbonate: 28.2 mmol/L — ABNORMAL HIGH (ref 20.0–28.0)
Calcium, Ion: 1.04 mmol/L — ABNORMAL LOW (ref 1.15–1.40)
HCT: 27 % — ABNORMAL LOW (ref 39.0–52.0)
Hemoglobin: 9.2 g/dL — ABNORMAL LOW (ref 13.0–17.0)
O2 Saturation: 89 %
Patient temperature: 98.2
Potassium: 4.1 mmol/L (ref 3.5–5.1)
Sodium: 137 mmol/L (ref 135–145)
TCO2: 29 mmol/L (ref 22–32)
pCO2 arterial: 39.9 mmHg (ref 32.0–48.0)
pH, Arterial: 7.457 — ABNORMAL HIGH (ref 7.350–7.450)
pO2, Arterial: 52 mmHg — ABNORMAL LOW (ref 83.0–108.0)

## 2020-08-31 LAB — TROPONIN I (HIGH SENSITIVITY)
Troponin I (High Sensitivity): 138 ng/L (ref <18)
Troponin I (High Sensitivity): 137 ng/L (ref <18)

## 2020-08-31 LAB — CBC
HCT: 23.6 % — ABNORMAL LOW (ref 39.0–52.0)
Hemoglobin: 8 g/dL — ABNORMAL LOW (ref 13.0–17.0)
MCH: 30.4 pg (ref 26.0–34.0)
MCHC: 33.9 g/dL (ref 30.0–36.0)
MCV: 89.7 fL (ref 80.0–100.0)
Platelets: 154 10*3/uL (ref 150–400)
RBC: 2.63 MIL/uL — ABNORMAL LOW (ref 4.22–5.81)
RDW: 13.7 % (ref 11.5–15.5)
WBC: 5.2 10*3/uL (ref 4.0–10.5)
nRBC: 0 % (ref 0.0–0.2)

## 2020-08-31 LAB — I-STAT CHEM 8, ED
BUN: 58 mg/dL — ABNORMAL HIGH (ref 6–20)
Calcium, Ion: 0.97 mmol/L — ABNORMAL LOW (ref 1.15–1.40)
Chloride: 97 mmol/L — ABNORMAL LOW (ref 98–111)
Creatinine, Ser: 13.7 mg/dL — ABNORMAL HIGH (ref 0.61–1.24)
Glucose, Bld: 89 mg/dL (ref 70–99)
HCT: 26 % — ABNORMAL LOW (ref 39.0–52.0)
Hemoglobin: 8.8 g/dL — ABNORMAL LOW (ref 13.0–17.0)
Potassium: 3.6 mmol/L (ref 3.5–5.1)
Sodium: 138 mmol/L (ref 135–145)
TCO2: 26 mmol/L (ref 22–32)

## 2020-08-31 LAB — CREATININE, SERUM
Creatinine, Ser: 13.79 mg/dL — ABNORMAL HIGH (ref 0.61–1.24)
GFR, Estimated: 4 mL/min — ABNORMAL LOW (ref >60)

## 2020-08-31 LAB — MRSA NEXT GEN BY PCR, NASAL: MRSA by PCR Next Gen: NOT DETECTED

## 2020-08-31 IMAGING — DX DG CHEST 1V PORT
1 series · 1 of 1 positions shown · non-contrast
Comparison: [DATE]

CLINICAL DATA: Post dialysis, hypoxia, dyspnea

EXAM:
PORTABLE CHEST 1 VIEW

[chest ap]
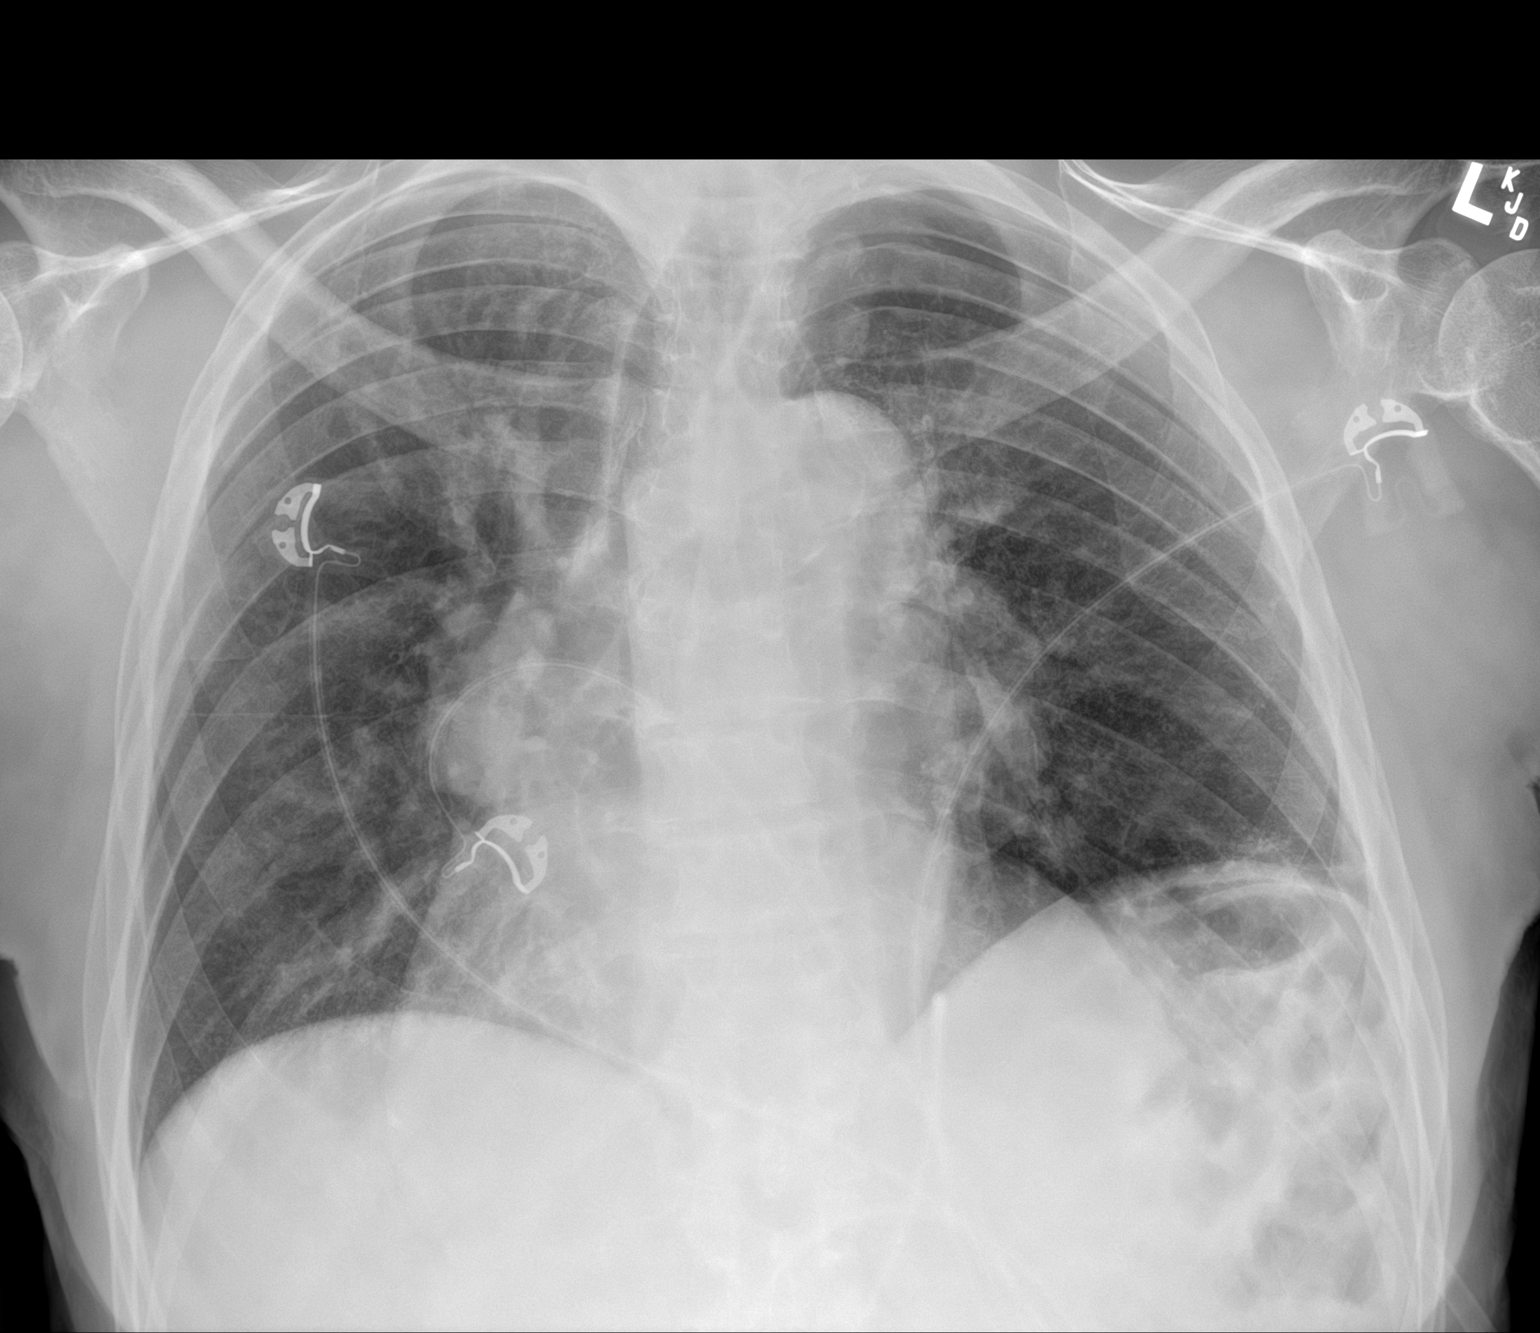

[1 of 1 positions shown; findings below may reference images not displayed]

FINDINGS: Lungs are clear. No pneumothorax or pleural effusion. Mild
cardiomegaly is stable. Pulmonary vascularity is normal. No acute
bone abnormality.
IMPRESSION: No active disease.  Stable cardiomegaly.

## 2020-08-31 IMAGING — CT CT HEAD W/O CM
4 series · 17 of 47 positions shown, 19 images · non-contrast
Comparison: [DATE]

CLINICAL DATA: Facial trauma. Dialysis patient with shortness of
breath and hypoxia

EXAM:
CT HEAD WITHOUT CONTRAST
TECHNIQUE: Contiguous axial images were obtained from the base of the skull
through the vertex without intravenous contrast.

[Series 3: head without · axial · non-contrast · 0.47mm/px · z∈[-112,+8]mm · 7 of 34 slices shown, 9 images]
[im 5/34  brain]
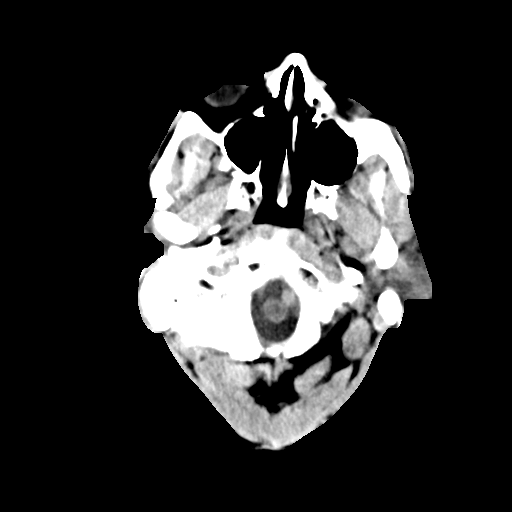
[im 5/34  bone]
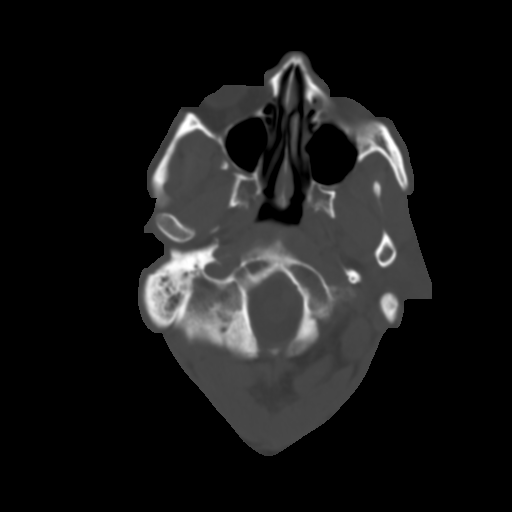
[im 9/34  brain]
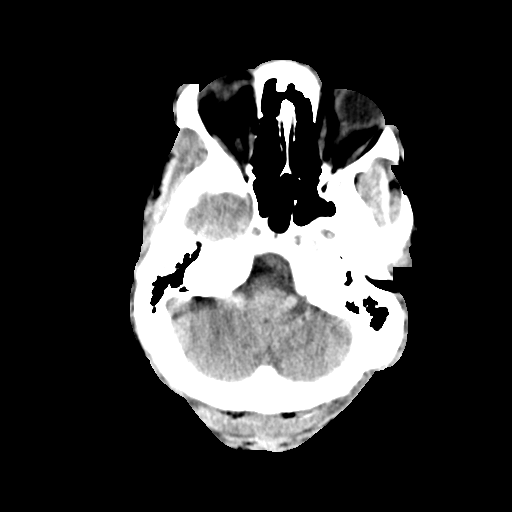
[im 13/34  brain]
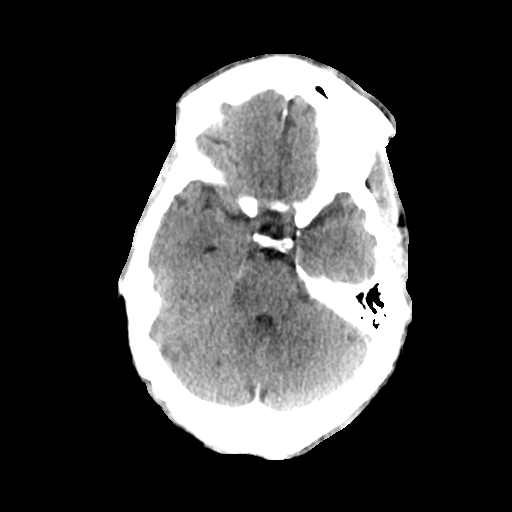
[im 17/34  brain]
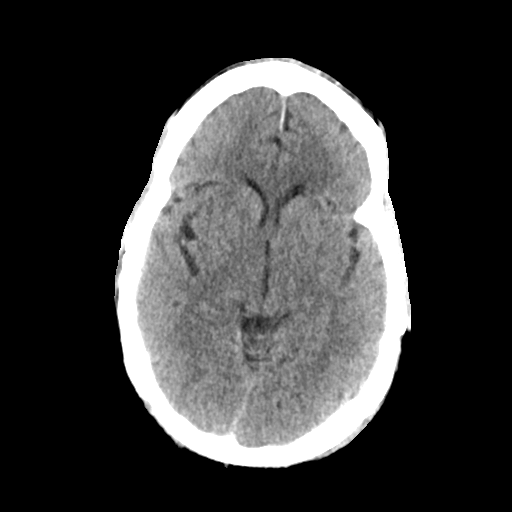
[im 21/34  brain]
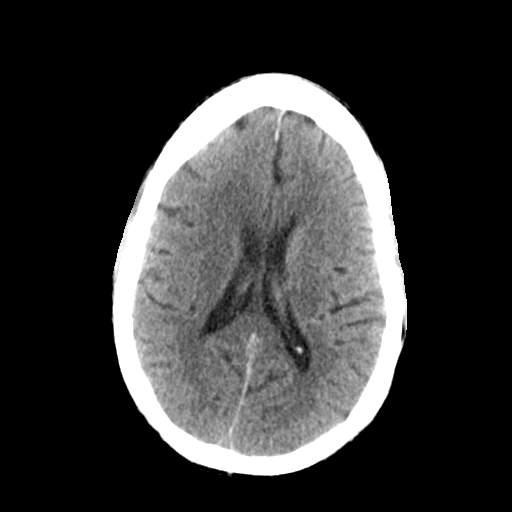
[im 21/34  bone]
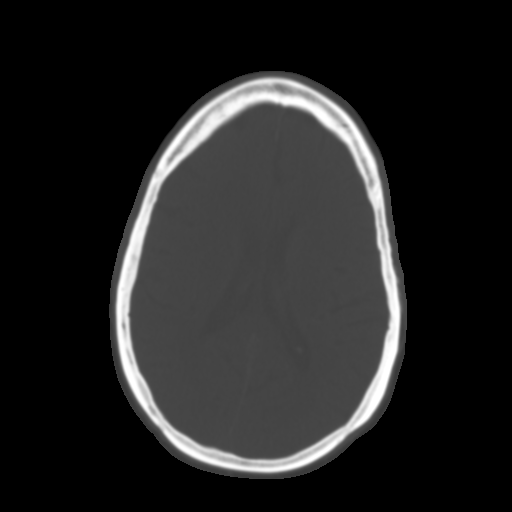
[im 25/34  brain]
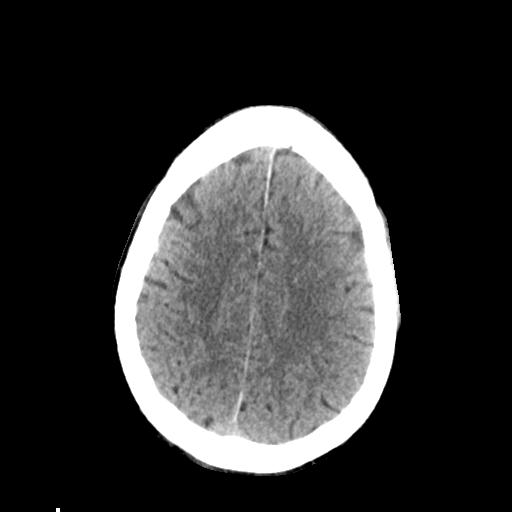
[im 29/34  brain]
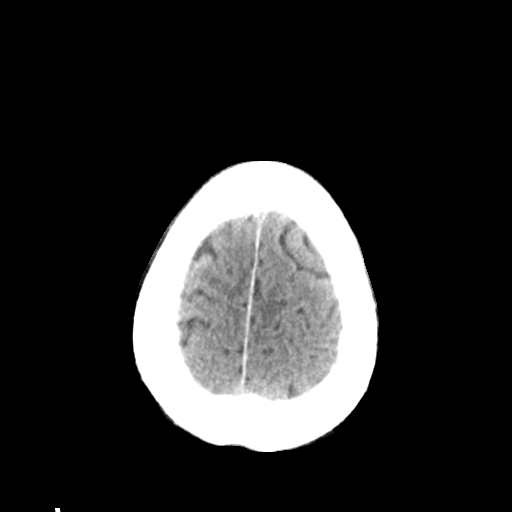

[Series 4: head bone · axial · 0.47mm/px · z∈[-116,-58]mm · 4 of 84 slices shown]
[im 9/84  bone]
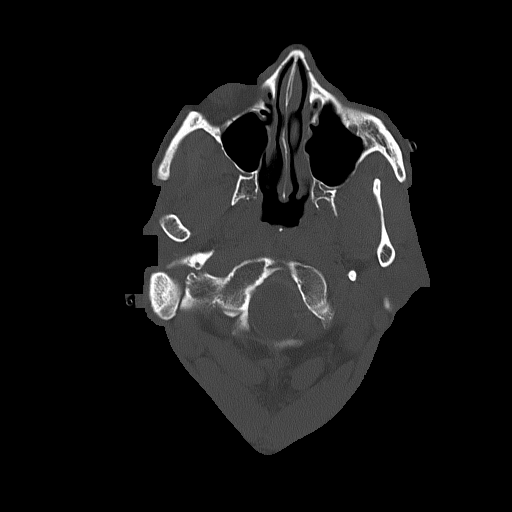
[im 17/84  bone]
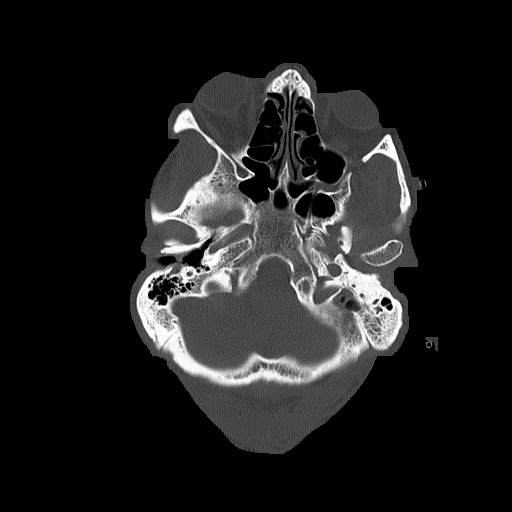
[im 25/84  bone]
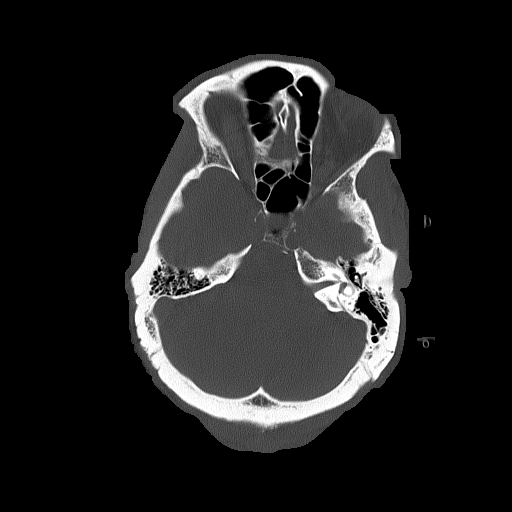
[im 38/84  bone]
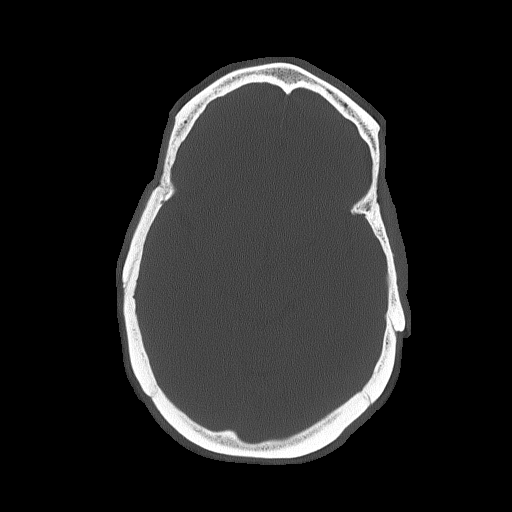

[Series 5: head without cor · coronal · non-contrast · 0.34mm/px · 3 of 69 slices shown]
[im 25/69  brain]
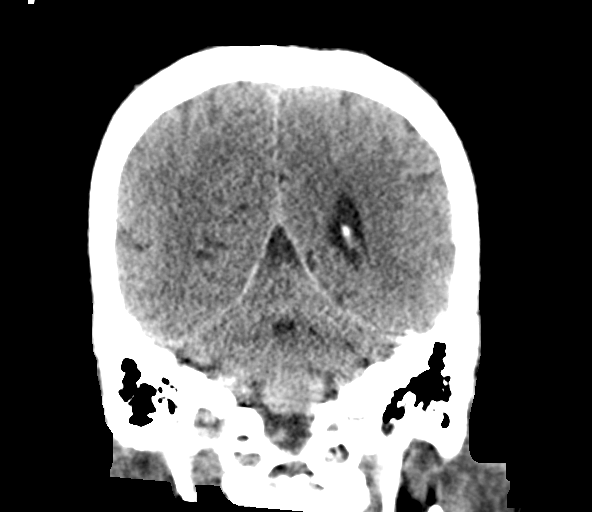
[im 31/69  brain]
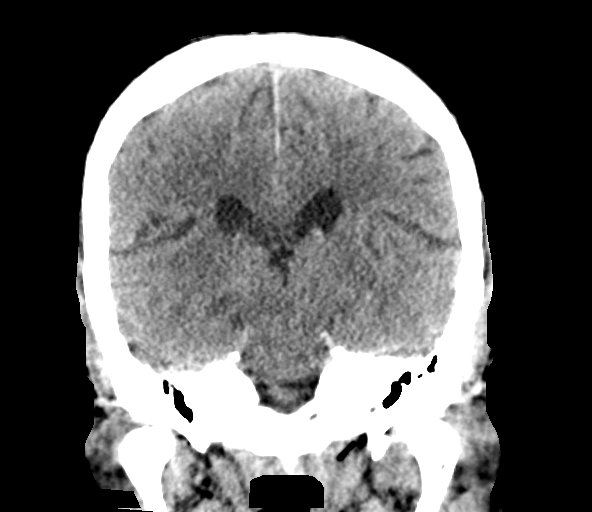
[im 38/69  brain]
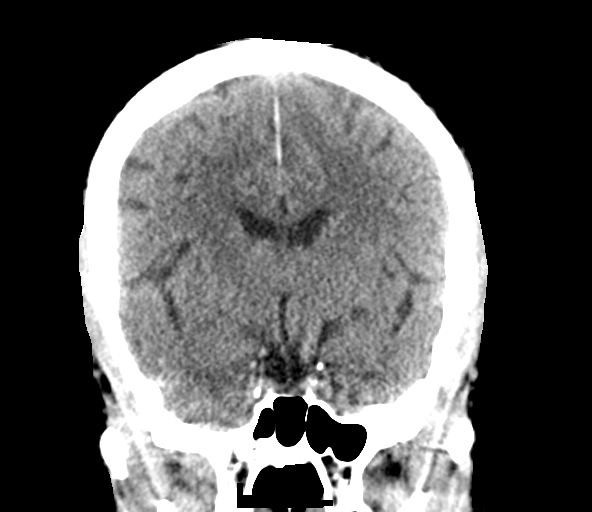

[Series 6: head without sag · sagittal · non-contrast · 0.35mm/px · 3 of 67 slices shown]
[im 23/67  brain]
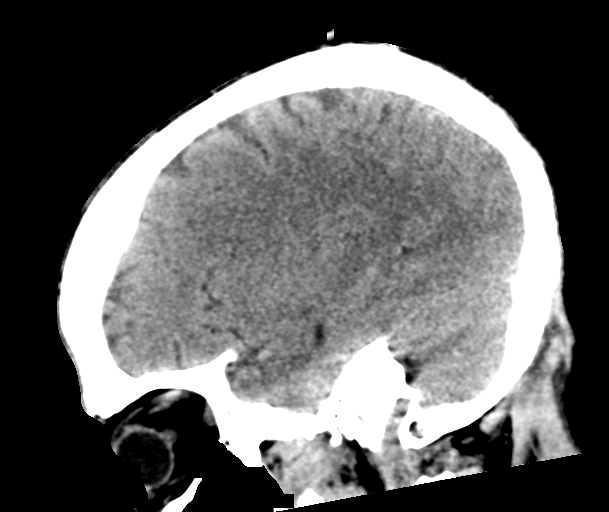
[im 34/67  brain]
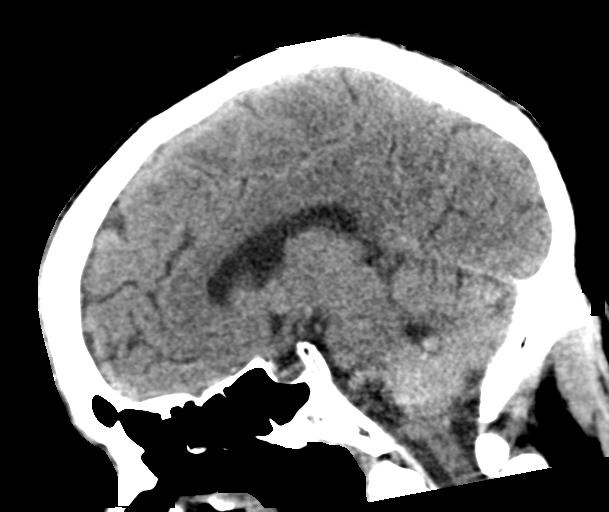
[im 45/67  brain]
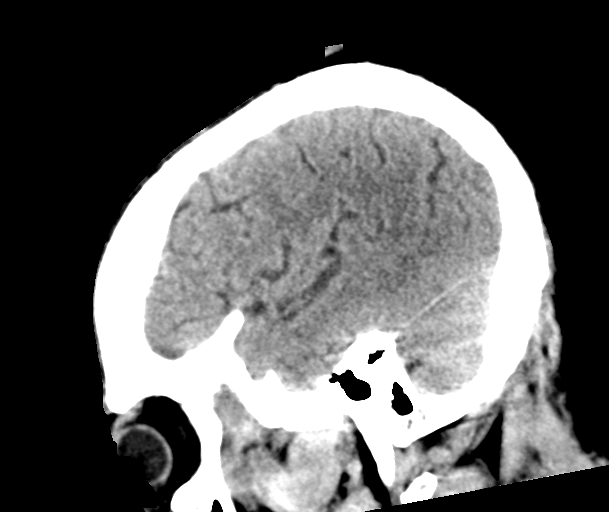

[17 of 47 positions shown; findings below may reference images not displayed]

FINDINGS: Brain: No evidence of acute infarction, hemorrhage, hydrocephalus,
extra-axial collection or mass lesion/mass effect.

Vascular: No hyperdense vessel or unexpected calcification.

Skull: Normal. Negative for fracture or focal lesion.

Sinuses/Orbits: No acute finding.

Other: Intermittent streak/motion artifact.
IMPRESSION: No acute finding.

## 2020-08-31 MED ORDER — CARVEDILOL 25 MG PO TABS
25.0000 mg | ORAL_TABLET | Freq: Two times a day (BID) | ORAL | Status: DC
  Administered 2020-08-31 – 2020-09-01 (×3): 25 mg via ORAL
  Filled 2020-08-31 (×4): qty 1

## 2020-08-31 MED ORDER — AMLODIPINE BESYLATE 10 MG PO TABS
10.0000 mg | ORAL_TABLET | Freq: Every morning | ORAL | Status: DC
  Administered 2020-08-31 – 2020-09-01 (×2): 10 mg via ORAL
  Filled 2020-08-31 (×2): qty 1

## 2020-08-31 MED ORDER — DOCUSATE SODIUM 100 MG PO CAPS: 100.0000 mg | ORAL_CAPSULE | Freq: Two times a day (BID) | ORAL | Status: DC | PRN

## 2020-08-31 MED ORDER — POLYVINYL ALCOHOL 1.4 % OP SOLN: 1.0000 [drp] | Freq: Every day | OPHTHALMIC | Status: DC | PRN

## 2020-08-31 MED ORDER — HYDRALAZINE HCL 20 MG/ML IJ SOLN: 20.0000 mg | Freq: Once | INTRAMUSCULAR | Status: DC

## 2020-08-31 MED ORDER — AMLODIPINE BESYLATE 5 MG PO TABS
10.0000 mg | ORAL_TABLET | Freq: Once | ORAL | Status: DC
  Filled 2020-08-31: qty 2

## 2020-08-31 MED ORDER — PANTOPRAZOLE SODIUM 40 MG PO TBEC
40.0000 mg | DELAYED_RELEASE_TABLET | Freq: Every day | ORAL | Status: DC
  Administered 2020-08-31 – 2020-09-01 (×2): 40 mg via ORAL
  Filled 2020-08-31 (×2): qty 1

## 2020-08-31 MED ORDER — HEPARIN SODIUM (PORCINE) 5000 UNIT/ML IJ SOLN
5000.0000 [IU] | Freq: Three times a day (TID) | INTRAMUSCULAR | Status: DC
  Administered 2020-08-31 – 2020-09-01 (×3): 5000 [IU] via SUBCUTANEOUS
  Filled 2020-08-31 (×4): qty 1

## 2020-08-31 MED ORDER — METHOCARBAMOL 500 MG PO TABS
500.0000 mg | ORAL_TABLET | Freq: Three times a day (TID) | ORAL | Status: DC | PRN
  Administered 2020-08-31: 500 mg via ORAL
  Filled 2020-08-31: qty 1

## 2020-08-31 MED ORDER — NICARDIPINE HCL IN NACL 20-0.86 MG/200ML-% IV SOLN
3.0000 mg/h | INTRAVENOUS | Status: DC
  Administered 2020-08-31: 5 mg/h via INTRAVENOUS
  Filled 2020-08-31: qty 200

## 2020-08-31 MED ORDER — HYDRALAZINE HCL 50 MG PO TABS
100.0000 mg | ORAL_TABLET | Freq: Three times a day (TID) | ORAL | Status: DC
  Administered 2020-08-31 – 2020-09-01 (×4): 100 mg via ORAL
  Filled 2020-08-31 (×4): qty 2

## 2020-08-31 MED ORDER — LABETALOL HCL 5 MG/ML IV SOLN
20.0000 mg | Freq: Once | INTRAVENOUS | Status: AC
  Administered 2020-08-31: 20 mg via INTRAVENOUS
  Filled 2020-08-31: qty 4

## 2020-08-31 MED ORDER — NITROGLYCERIN 2 % TD OINT
1.0000 [in_us] | TOPICAL_OINTMENT | Freq: Once | TRANSDERMAL | Status: AC
  Administered 2020-08-31: 1 [in_us] via TOPICAL
  Filled 2020-08-31: qty 1

## 2020-08-31 MED ORDER — RENA-VITE PO TABS
1.0000 | ORAL_TABLET | Freq: Every day | ORAL | Status: DC
  Administered 2020-08-31: 1 via ORAL
  Filled 2020-08-31: qty 1

## 2020-08-31 MED ORDER — POLYETHYLENE GLYCOL 3350 17 G PO PACK: 17.0000 g | PACK | Freq: Every day | ORAL | Status: DC | PRN

## 2020-08-31 MED ORDER — AMLODIPINE BESYLATE 5 MG PO TABS: 10.0000 mg | ORAL_TABLET | Freq: Every day | ORAL | Status: DC

## 2020-08-31 MED ORDER — SEVELAMER CARBONATE 800 MG PO TABS
2400.0000 mg | ORAL_TABLET | Freq: Three times a day (TID) | ORAL | Status: DC
  Administered 2020-08-31 – 2020-09-01 (×4): 2400 mg via ORAL
  Filled 2020-08-31 (×4): qty 3

## 2020-08-31 MED ORDER — DARBEPOETIN ALFA 100 MCG/0.5ML IJ SOSY: 100.0000 ug | PREFILLED_SYRINGE | INTRAMUSCULAR | Status: DC

## 2020-08-31 MED ORDER — HYDRALAZINE HCL 20 MG/ML IJ SOLN
5.0000 mg | Freq: Once | INTRAMUSCULAR | Status: AC
  Administered 2020-08-31: 5 mg via INTRAVENOUS
  Filled 2020-08-31: qty 1

## 2020-08-31 NOTE — H&P (Signed)
NAME:  Jason [name]., MRN:  [id number], DOB:  July 13, 1960, LOS: 0 ADMISSION DATE:  08/30/2020, CONSULTATION DATE:  8/21 REFERRING MD:  Randal Buba MD, CHIEF COMPLAINT:  Missed HD   History of Present Illness:  Patient is encephalopathic. Therefore history has been obtained from chart review.    Jason [name]. ,is a 60 y.o. male, who presented to the [facility] ED with a chief complaint of missed HD  They have a pertinent past medical history of ESRD on dialysis, HTN, CHF, anemia, low back pain  Jason [name]. Presented to the [facility] ED after missing his last 3 HD treatments while traveling. He was found to have a K of 6.9 and BUN of 162. He went for emergent dialysis where a reported 4L were removed.   After HD developed hypertensive emergency with BP as high as 233/117, along with altered mental status and hypoxia requiring 5L of O2. 15 mg IV hydralazine, 20mg  labatelol, 1 inch of nitro paste was given with no effect. Neurology was consulted. Head CT was negative for acute changes. The patient was started on a cardine drip. On exam the patient states that he forgot to take his BP medication yesterday.  PCCM was consulted for admission.   Pertinent  Medical History  ESRD on dialysis, HTN, CHF, anemia, low back pain  Significant Hospital Events: Including procedures, antibiotic start and stop dates in addition to other pertinent events   Presented to ED for HD, 4L removed, devolped hypertension and AMS, PCCM consulted  Interim History / Subjective:  Admit  Confused  Unable to obtain subjective evaluation due to patient status  Objective   Blood pressure (!) 218/118, pulse 66, temperature 99.3 F (37.4 C), temperature source Oral, resp. rate 16, height 6\' 2"  (1.88 m), weight 72.7 kg, SpO2 97 %. On 5L [place]        Intake/Output Summary (Last 24 hours) at 08/31/2020 0435 Last data filed at 08/31/2020 0100 Gross per 24 hour  Intake --  Output 4000 ml  Net -4000 ml   Filed Weights    08/30/20 1925 08/30/20 2150 08/31/20 0100  Weight: 76.7 kg 76.7 kg 72.7 kg    Examination: General:  in bed, drowsy, NAD HEENT: MM pink/moist, anicteric, atraumatic Neuro: GCS 14, confused, RASS -1, PERRL 86mm, MAE CV: S1S2, NSR, no m/r/g appreciated PULM:  clear in the upper lobes and in the lower lobes, Trachea midline, chest expansion symmetric GI: soft, bsx4 active, non distended Extremities: warm/dry, no pretibial edema, capillary refil less than 3 seconds  Skin: no rashes or lesions noted, LUE fistula with thrill and bruit   Resolved Hospital Problem list     Assessment & Plan:   Hypertensive emergency- Patient states that he did not take his home antihypertensives on 8/20. Given15 mg IV hydralazine, 20mg  labatelol, 1 inch of nitro paste was given with no effect and continued AMS Altered mental status- Secondary to above, ?press -Neurology consulted, appreciate assistance, MRI brain ordered, follow up. -Goal 20-25% BP reduction goal SBP 170-180. Starting on cardine gtt -Plan to resume home coreg 25mg  bid, 100mg  hydralazine TID this am. Plan to transition off cardine gtt and resume home amlodipine 10mg  daily as patient improves -Admit to ICU  Acute Respiratory Failure with Hypoxia Reported by EDP post dialysis. On 5l [place]. O2 decreased to 2L upon exam. Suspect secondary to hypertensive crisis. No infiltrate or effusion on CXR.  -Plan as above -Optimize positioning  -Follow up ABG  Troponin elevation  885>027. No ST changes seen on CXR. Pt denies chest pain. Suspect secondary to demand. -Monitor -Continue tele  Normocytic Anemia, suspect chronic HX ESRD. No signs of bleeding -Monitor  Hyperkalemia HX ESRD on Dialysis K 6.9>3.6 post HD -Nephrology consulted. Appreciate assistance -HD per neph -monitor K  HX HTN HX CHF -Resume home anti-HTN and GDMT as discussed   Best Practice (right click and "Reselect all SmartList Selections" daily)   Diet/type: NPO w/ oral  meds DVT prophylaxis: prophylactic heparin  GI prophylaxis: PPI Lines: N/A Foley:  N/A Code Status:  full code Last date of multidisciplinary goals of care discussion [pending]  Labs   CBC: Recent Labs  Lab 08/30/20 1935 08/31/20 0245  WBC 6.4  --   HGB 8.7* 8.8*  HCT 25.9* 26.0*  MCV 90.6  --   PLT 183  --     Basic Metabolic Panel: Recent Labs  Lab 08/30/20 1935 08/31/20 0245  NA 137 138  K 6.9* 3.6  CL 96* 97*  CO2 17*  --   GLUCOSE 122* 89  BUN 162* 58*  CREATININE 24.64* 13.70*  CALCIUM 7.8*  --    GFR: Estimated Creatinine Clearance: 6 mL/min (A) (by C-G formula based on SCr of 13.7 mg/dL (H)). Recent Labs  Lab 08/30/20 1935  WBC 6.4    Liver Function Tests: No results for input(s): AST, ALT, ALKPHOS, BILITOT, PROT, ALBUMIN in the last 168 hours. No results for input(s): LIPASE, AMYLASE in the last 168 hours. No results for input(s): AMMONIA in the last 168 hours.  ABG    Component Value Date/Time   PHART 7.331 (L) 01/17/2017 1440   PCO2ART 42.5 01/17/2017 1440   PO2ART 91.3 01/17/2017 1440   HCO3 29.7 (H) 10/31/2019 0442   TCO2 26 08/31/2020 0245   ACIDBASEDEF 7.0 (H) 04/26/2019 1414   O2SAT 78.0 10/31/2019 0442     Coagulation Profile: No results for input(s): INR, PROTIME in the last 168 hours.  Cardiac Enzymes: No results for input(s): CKTOTAL, CKMB, CKMBINDEX, TROPONINI in the last 168 hours.  HbA1C: Hgb A1c MFr Bld  Date/Time Value Ref Range Status  01/17/2017 01:17 AM 4.9 4.8 - 5.6 % Final    Comment:    (NOTE) Pre diabetes:          5.7%-6.4% Diabetes:              >6.4% Glycemic control for   <7.0% adults with diabetes     CBG: No results for input(s): GLUCAP in the last 168 hours.  Review of Systems:   Unable to obtain a ROS due to patient confusion  Past Medical History:  He,  has a past medical history of A-V fistula ([place]), Abdominal hernia, AKI (acute kidney injury) ([place]) (01/2015), Anasarca, Anemia, Asthma,  Bilateral inguinal hernia, Bilateral pleural effusion (01/2017), Cellulitis (01/16/2017), CHF (congestive heart failure) ([place]), Chronic venous insufficiency, Concentric left ventricular hypertrophy (74/12/8784), Diastolic dysfunction (76/72/0947), Elevated LFTs, End stage renal disease ([place]), H/O right inguinal hernia repair (11/2017), History of colonoscopy (03/20/2018), History of elevated PSA (10/2017), Hypertension, Pneumonia, Pulmonary hypertension ([place]), and Venous stasis ulcer ([place]).   Surgical History:   Past Surgical History:  Procedure Laterality Date   AV FISTULA PLACEMENT Right 01/20/2017   Procedure: ARTERIOVENOUS (AV) FISTULA CREATION;  Surgeon: Angelia Mould, MD;  [facility];  Service: Vascular;  Laterality: Right;   HERNIA REPAIR     INSERTION OF DIALYSIS CATHETER Right 01/20/2017   Procedure: INSERTION OF DIALYSIS CATHETER;  Surgeon:  Angelia Mould, MD;  Location: [facility];  Service: Vascular;  Laterality: Right;   INSERTION OF MESH N/A 11/18/2017   Procedure: INSERTION OF MESH;  Surgeon: Michael Boston, MD;  [facility];  Service: General;  Laterality: N/A;   IR FLUORO GUIDE CV LINE RIGHT  01/18/2017   IR LUMBAR DISC ASPIRATION W/IMG GUIDE  07/21/2020   IR US GUIDE VASC ACCESS RIGHT  01/18/2017   LAPAROSCOPIC INGUINAL HERNIA WITH UMBILICAL HERNIA Bilateral 11/18/2017   Procedure: LAPAROSCOPIC BILATERAL INGUINAL HERNIA REPAIR WITH UMBILICAL HERNIA REPAIR WITH MESH;  Surgeon: Michael Boston, MD;  [facility];  Service: General;  Laterality: Bilateral;   TOE SURGERY     right fracture in big toe   TONSILLECTOMY       Social History:   reports that he quit smoking about 25 years ago. He has never used smokeless tobacco. He reports that he does not currently use alcohol. He reports that he does not currently use drugs.   Family History:  His family history includes Diabetes in his father; Healthy in his brother; Heart attack (age of onset: 20) in his mother;  Heart disease in his mother; Hypertension in his mother; Prostate cancer in his maternal grandfather; Stroke in his mother. There is no history of Colon cancer, Colon polyps, Esophageal cancer, Stomach cancer, or Rectal cancer.   Allergies Allergies  Allergen Reactions   Lisinopril Cough     Home Medications  Prior to Admission medications   Medication Sig Start Date End Date Taking? Authorizing Provider  amLODipine (NORVASC) 10 MG tablet Take 10 mg by mouth every morning. 06/20/18  Yes [provider]  carvedilol (COREG) 25 MG tablet Take 1 tablet (25 mg total) by mouth 2 (two) times daily with a meal. 02/02/18  Yes [name], FNP  doxercalciferol (HECTOROL) 0.5 MCG capsule At dialysis 02/21/20 02/19/21 Yes [provider]  hydrALAZINE (APRESOLINE) 100 MG tablet Take 1 tablet (100 mg total) by mouth 3 (three) times daily. Patient taking differently: Take 100 mg by mouth 2 (two) times daily. 11/03/19 08/31/20 Yes Pahwani, Einar Grad, MD  iron sucrose in sodium chloride 0.9 % 100 mL Iron Sucrose (Venofer) 02/26/20 02/17/21 Yes [provider]  methocarbamol (ROBAXIN) 500 MG tablet Take 1 tablet (500 mg total) by mouth every 8 (eight) hours as needed for muscle spasms. 07/22/20  Yes Caren Griffins, MD  Methoxy PEG-Epoetin Beta (MIRCERA IJ) Mircera 02/12/20 02/10/21 Yes [provider]  multivitamin (RENA-VIT) TABS tablet Take 1 tablet by mouth every morning. 10/20/17  Yes [provider]  polyvinyl alcohol (LIQUIFILM TEARS) 1.4 % ophthalmic solution Place 1 drop into both eyes daily as needed for dry eyes.   Yes [provider]  sevelamer carbonate (RENVELA) 800 MG tablet Take 2,400 mg by mouth 3 (three) times daily with meals. 01/15/20  Yes [provider]     Critical care time: 32 minutes     Redmond School., MSN, APRN, AGACNP-BC [facility]  08/31/2020 , 5:38 AM  Please see [email or web address] for pager  details  If no response, please call [phone] After hours, please call Elink at [phone]

## 2020-08-31 NOTE — ED Notes (Signed)
Patient transported to CT 

## 2020-08-31 NOTE — Progress Notes (Signed)
After urgent HD overnight, has apparently returned to baseline. Likely his encephalopathy was due to his multiple missed HD sessions.   Will be available as needed, MRI only if felt indicated by primary team.    Roland Rack, MD [facility] [phone]  If 7pm- 7am, please page neurology on call as listed in [place].

## 2020-08-31 NOTE — Consult Note (Addendum)
NEUROLOGY CONSULTATION NOTE   Date of service: August 31, 2020 Patient Name: Jason [name]. MRN:  [id number] DOB:  07-Dec-1960 Reason for consult: "confusion" Requesting Provider: Joslyn Devon, MD _ _ _   _ __   _ __ _ _  __ __   _ __   __ _  History of Present Illness  Jason Viets. is a 60 y.o. male with PMH significant for ESRD with non-compliance with HD, hx of CHF, hypertension, who missed 3 Hd session over last week and presented with SOB and back pain. Found to have deranged electrolytes with elevated Potassium to 6.9 and uremia. He underwent dialysis yesterday evening with significant improvement in chemistry. Post dialysis was noted to be very somnolent, difficulty staying awake.  CTH was obtained and on my review appears to be negative for any acute abnormality, no ICH. He is persistently hypertensive with SBP persistently in 353I systolic. We were asked to assess him for AMS somnolence and difficulty staying awake.   ROS   Unable to obtain a detailed ROS as it is difficult to keep him awake.  Past History   Past Medical History:  Diagnosis Date  . A-V fistula (HCC)    Right arm  . Abdominal hernia   . AKI (acute kidney injury) ([employer]) 01/2015  . Anasarca   . Anemia   . Asthma    as a child  . Bilateral inguinal hernia   . Bilateral pleural effusion 01/2017  . Cellulitis 01/16/2017   Bilateral lower extremity  . CHF (congestive heart failure) ([place])   . Chronic venous insufficiency   . Concentric left ventricular hypertrophy 08/17/2017   Moderated, noted on ECHO  . Diastolic dysfunction 14/43/1540   noted on ECHO  . Elevated LFTs    per patient, resolved  . End stage renal disease [facility])    Dialysis T/Th/Sa  Started 02/2017/pt is waiting for a kidney transplant  . H/O right inguinal hernia repair 11/2017  . History of colonoscopy 03/20/2018  . History of elevated PSA 10/2017  . Hypertension   . Pneumonia   . Pulmonary hypertension (HCC)    Moderate  .  Venous stasis ulcer ([place])    bil legs/ healed up now per pt (03/06/18)   Past Surgical History:  Procedure Laterality Date  . AV FISTULA PLACEMENT Right 01/20/2017   Procedure: ARTERIOVENOUS (AV) FISTULA CREATION;  Surgeon: Angelia Mould, MD;  Location: [facility];  Service: Vascular;  Laterality: Right;  . HERNIA REPAIR    . INSERTION OF DIALYSIS CATHETER Right 01/20/2017   Procedure: INSERTION OF DIALYSIS CATHETER;  Surgeon: Angelia Mould, MD;  Location: [facility];  Service: Vascular;  Laterality: Right;  . INSERTION OF MESH N/A 11/18/2017   Procedure: INSERTION OF MESH;  Surgeon: Michael Boston, MD;  [facility];  Service: General;  Laterality: N/A;  . IR FLUORO GUIDE CV LINE RIGHT  01/18/2017  . IR LUMBAR DISC ASPIRATION W/IMG GUIDE  07/21/2020  . IR US GUIDE VASC ACCESS RIGHT  01/18/2017  . LAPAROSCOPIC INGUINAL HERNIA WITH UMBILICAL HERNIA Bilateral 11/18/2017   Procedure: LAPAROSCOPIC BILATERAL INGUINAL HERNIA REPAIR WITH UMBILICAL HERNIA REPAIR WITH MESH;  Surgeon: Michael Boston, MD;  [facility];  Service: General;  Laterality: Bilateral;  . TOE SURGERY     right fracture in big toe  . TONSILLECTOMY     Family History  Problem Relation Age of Onset  . Heart attack Mother 4  CABG hx  . Heart disease Mother   . Hypertension Mother   . Stroke Mother   . Diabetes Father   . Healthy Brother   . Prostate cancer Maternal Grandfather   . Colon cancer Neg Hx   . Colon polyps Neg Hx   . Esophageal cancer Neg Hx   . Stomach cancer Neg Hx   . Rectal cancer Neg Hx    Social History   Socioeconomic History  . Marital status: Single    Spouse name: Not on file  . Number of children: Not on file  . Years of education: Not on file  . Highest education level: Not on file  Occupational History  . Not on file  Tobacco Use  . Smoking status: Former    Years: 17.00    Types: Cigarettes    Quit date: 1997    Years since quitting: 25.6  . Smokeless tobacco: Never   . Tobacco comments:    quit smoking in 1997  Vaping Use  . Vaping Use: Never used  Substance and Sexual Activity  . Alcohol use: Not Currently    Comment: every several months, rare  . Drug use: Not Currently    Comment: marijuana  . Sexual activity: Not Currently  Other Topics Concern  . Not on file  Social History Narrative  . Not on file   Social Determinants of Health   Financial Resource Strain: Not on file  Food Insecurity: Not on file  Transportation Needs: Not on file  Physical Activity: Not on file  Stress: Not on file  Social Connections: Not on file   Allergies  Allergen Reactions  . Lisinopril Cough    Medications  (Not in a hospital admission)    Vitals   Vitals:   08/31/20 0154 08/31/20 0230 08/31/20 0245 08/31/20 0300  BP: (!) 147/116 (!) 205/109 (!) 203/103 (!) 220/109  Pulse: 91 78 83 80  Resp: 19 20 (!) 23 17  Temp:      TempSrc:      SpO2: 96% 95% 98% 97%  Weight:      Height:         Body mass index is 20.58 kg/m.  Physical Exam   General: Laying comfortably in bed; in no acute distress.  HENT: Normal oropharynx and mucosa. Normal external appearance of ears and nose.  Neck: Supple, no pain or tenderness  CV: No JVD. No peripheral edema.  Pulmonary: Symmetric Chest rise. Normal respiratory effort.  Abdomen: Soft to touch, non-tender.  Ext: No cyanosis, edema, or deformity  Skin: No rash. Normal palpation of skin.   Musculoskeletal: Normal digits and nails by inspection. No clubbing.   Neurologic Examination  Mental status/Cognition: somnolent, opens eyes to voice, oriented to self, place, but not to month and year. Knows the president. Poor attention.  Speech/language: Non fluent, comprehension intact to simple commands, object naming intact, repetition intact. Cranial nerves:   CN II Pupils equal and reactive to light, no VF deficits    CN III,IV,VI EOM intact, no gaze preference or deviation, no nystagmus    CN V normal  sensation in V1, V2, and V3 segments bilaterally    CN VII no asymmetry, no nasolabial fold flattening    CN VIII normal hearing to speech    CN IX & X normal palatal elevation, no uvular deviation    CN XI 5/5 head turn and 5/5 shoulder shrug bilaterally    CN XII midline tongue protrusion  Motor:  Muscle bulk: normal, tone normal, pronator drift none tremor none Mvmt Root Nerve  Muscle Right Left Comments  SA C5/6 Ax Deltoid     EF C5/6 Mc Biceps 5 5   EE C6/7/8 Rad Triceps 5 5   WF C6/7 Med FCR     WE C7/8 PIN ECU     F Ab C8/T1 U ADM/FDI 5 5   HF L1/2/3 Fem Illopsoas 5 5   KE L2/3/4 Fem Quad 5 5   DF L4/5 D Peron Tib Ant 5 5   PF S1/2 Tibial Grc/Sol 5 5    Reflexes:  Right Left Comments  Pectoralis      Biceps (C5/6) 2 2   Brachioradialis (C5/6) 2 2    Triceps (C6/7) 2 2    Patellar (L3/4) 2 2    Achilles (S1)      Hoffman      Plantar     Jaw jerk    Sensation:  Light touch intact   Pin prick    Temperature    Vibration   Proprioception    Coordination/Complex Motor:  - Finger to Nose intact - Heel to shin unable to get hime to do 2/2 somnolence. - Rapid alternating movement are slowed - Gait: unsafe to assess given the degree of somnolence.  Labs   CBC:  Recent Labs  Lab 08/30/20 1935 08/31/20 0245  WBC 6.4  --   HGB 8.7* 8.8*  HCT 25.9* 26.0*  MCV 90.6  --   PLT 183  --     Basic Metabolic Panel:  Lab Results  Component Value Date   NA 138 08/31/2020   K 3.6 08/31/2020   CO2 17 (L) 08/30/2020   GLUCOSE 89 08/31/2020   BUN 58 (H) 08/31/2020   CREATININE 13.70 (H) 08/31/2020   CALCIUM 7.8 (L) 08/30/2020   GFRNONAA 2 (L) 08/30/2020   GFRAA 5 (L) 04/29/2019   Lipid Panel:  Lab Results  Component Value Date   LDLCALC 61 03/19/2019   HgbA1c:  Lab Results  Component Value Date   HGBA1C 4.9 01/17/2017   Urine Drug Screen:     Component Value Date/Time   LABOPIA NONE DETECTED 10/30/2019 2335   COCAINSCRNUR NONE DETECTED 10/30/2019  2335   COCAINSCRNUR Negative 04/26/2019 2125   LABBENZ NONE DETECTED 10/30/2019 2335   AMPHETMU NONE DETECTED 10/30/2019 2335   THCU NONE DETECTED 10/30/2019 2335   LABBARB NONE DETECTED 10/30/2019 2335    Alcohol Level     Component Value Date/Time   ETH <10 10/30/2019 1714    CT Head without contrast: Personally reviewed and CTH was negative for a large hypodensity concerning for a large territory infarct or hyperdensity concerning for an ICH  MRI Brain: pending  Impression   Jason [name]. is a 61 y.o. male with PMH significant for ESRD with non-compliance with HD, hx of CHF, hypertension, who missed 3 Hd session over last week and presented with SOB and back pain. Found to have deranged electrolytes with elevated Potassium to 6.9 and uremia. He underwent dialysis yesterday evening with significant improvement in chemistry. Post dialysis was noted to be very somnolent, difficulty staying awake. Requires a lot of encouragement to get him to participate with the exam. He is still able to exert 5/5 strength in all extremities with intact sensation with no focal deficit. Somnolent precludes detailed cognitive evaluation but he is disoriented to time.  I think his presentation is likely a combination of somnolence  given it is late in night coupled with potentially resolving uremic encephalopathy and hypertensive emergency with poor response to PRNs and PO HTN meds so far.  Recommendations  - Agree with using Cardene gtt for hypertension. - Would recommend gradual normotension. Do not drop his blood pressure by more than 20% over any 24 hours period until he is normotensive. - recommend MRI Brain without contrast. - No seizure like activity seen, I do not see need for cEEG right now. ____________________________________________________________________  Plan discussed with Jason Aldo NP with the PCCM team.  This patient is critically ill and at significant risk of neurological  worsening, death and care requires constant monitoring of vital signs, hemodynamics,respiratory and cardiac monitoring, neurological assessment, discussion with family, other specialists and medical decision making of high complexity. I spent 35 minutes of neurocritical care time  in the care of  this patient. This was time spent independent of any time provided by nurse practitioner or PA.  Donnetta Simpers [facility] Pager Number [phone] 08/31/2020  5:44 AM   Thank you for the opportunity to take part in the care of this patient. If you have any further questions, please contact the neurology consultation attending.  Signed,  [place] Pager Number [phone] _ _ _   _ __   _ __ _ _  __ __   _ __   __ _

## 2020-08-31 NOTE — Consult Note (Addendum)
[facility] Renal Consultation Note    Indication for Consultation:  Management of ESRD/hemodialysis; anemia, hypertension/volume and secondary hyperparathyroidism PCP:  HPI: Jason [name]. is a 60 y.o. male with ESRD on hemodialysis MWF at [facility]. PMH: HTN, HFpEF, G1DD, medical noncompliance with HD, anemia of ESRD, SHPT. Last HD at [facility] 08/21/2020 signed off after 2:27 hours of 4 hour 15 min treatment, left 2.1 kgs above OP EDW.   He presented to ED 08/30/2020 after missing 3 dialysis sessions with AMS, hypertensive emergency and hyperkalemia. SCr 24.64 BUN 162 K+ 6.9 on arrival to ED. CXR unremarkable. He was given lokelma and had urgent HD overnight. He was started on cardene gtt for hypertensive urgency which has now been weaned and discontinued. Neurology was consulted d/t AMS.   Seen in 2M10, resting quietly without complaints. Mental status appears at baseline. Says he missed 3 HD treatments, apparently went out of town and couldn't get ride back home. Denies SOB, chest pain, says he is "fine".   Past Medical History:  Diagnosis Date   A-V fistula ([place])    Right arm   Abdominal hernia    AKI (acute kidney injury) ([place]) 01/2015   Anasarca    Anemia    Asthma    as a child   Bilateral inguinal hernia    Bilateral pleural effusion 01/2017   Cellulitis 01/16/2017   Bilateral lower extremity   CHF (congestive heart failure) (HCC)    Chronic venous insufficiency    Concentric left ventricular hypertrophy 08/17/2017   Moderated, noted on ECHO   Diastolic dysfunction 25/05/3974   noted on ECHO   Elevated LFTs    per patient, resolved   End stage renal disease ([place])    Dialysis T/Th/Sa  Started 02/2017/pt is waiting for a kidney transplant   H/O right inguinal hernia repair 11/2017   History of colonoscopy 03/20/2018   History of elevated PSA 10/2017   Hypertension    Pneumonia    Pulmonary hypertension ([place])    Moderate   Venous  stasis ulcer ([place])    bil legs/ healed up now per pt (03/06/18)   Past Surgical History:  Procedure Laterality Date   AV FISTULA PLACEMENT Right 01/20/2017   Procedure: ARTERIOVENOUS (AV) FISTULA CREATION;  Surgeon: Angelia Mould, MD;  Location: [facility];  Service: Vascular;  Laterality: Right;   HERNIA REPAIR     INSERTION OF DIALYSIS CATHETER Right 01/20/2017   Procedure: INSERTION OF DIALYSIS CATHETER;  Surgeon: Angelia Mould, MD;  Location: [facility];  Service: Vascular;  Laterality: Right;   INSERTION OF MESH N/A 11/18/2017   Procedure: INSERTION OF MESH;  Surgeon: Michael Boston, MD;  Location: [facility];  Service: General;  Laterality: N/A;   IR FLUORO GUIDE CV LINE RIGHT  01/18/2017   IR LUMBAR DISC ASPIRATION W/IMG GUIDE  07/21/2020   IR US GUIDE VASC ACCESS RIGHT  01/18/2017   LAPAROSCOPIC INGUINAL HERNIA WITH UMBILICAL HERNIA Bilateral 11/18/2017   Procedure: LAPAROSCOPIC BILATERAL INGUINAL HERNIA REPAIR WITH UMBILICAL HERNIA REPAIR WITH MESH;  Surgeon: Michael Boston, MD;  Location: [facility];  Service: General;  Laterality: Bilateral;   TOE SURGERY     right fracture in big toe   TONSILLECTOMY     Family History  Problem Relation Age of Onset   Heart attack Mother 45       CABG hx   Heart disease Mother    Hypertension Mother    Stroke Mother  Diabetes Father    Healthy Brother    Prostate cancer Maternal Grandfather    Colon cancer Neg Hx    Colon polyps Neg Hx    Esophageal cancer Neg Hx    Stomach cancer Neg Hx    Rectal cancer Neg Hx    Social History:  reports that he quit smoking about 25 years ago. He has never used smokeless tobacco. He reports that he does not currently use alcohol. He reports that he does not currently use drugs. Allergies  Allergen Reactions   Lisinopril Cough   Prior to Admission medications   Medication Sig Start Date End Date Taking? Authorizing Provider  amLODipine (NORVASC) 10 MG tablet Take 10 mg by mouth every morning. 06/20/18   Yes [provider]  carvedilol (COREG) 25 MG tablet Take 1 tablet (25 mg total) by mouth 2 (two) times daily with a meal. 02/02/18  Yes [name], FNP  doxercalciferol (HECTOROL) 0.5 MCG capsule At dialysis 02/21/20 02/19/21 Yes [provider]  hydrALAZINE (APRESOLINE) 100 MG tablet Take 1 tablet (100 mg total) by mouth 3 (three) times daily. Patient taking differently: Take 100 mg by mouth 2 (two) times daily. 11/03/19 08/31/20 Yes Pahwani, Einar Grad, MD  iron sucrose in sodium chloride 0.9 % 100 mL Iron Sucrose (Venofer) 02/26/20 02/17/21 Yes [provider]  methocarbamol (ROBAXIN) 500 MG tablet Take 1 tablet (500 mg total) by mouth every 8 (eight) hours as needed for muscle spasms. 07/22/20  Yes Caren Griffins, MD  Methoxy PEG-Epoetin Beta (MIRCERA IJ) Mircera 02/12/20 02/10/21 Yes [provider]  multivitamin (RENA-VIT) TABS tablet Take 1 tablet by mouth every morning. 10/20/17  Yes [provider]  polyvinyl alcohol (LIQUIFILM TEARS) 1.4 % ophthalmic solution Place 1 drop into both eyes daily as needed for dry eyes.   Yes [provider]  sevelamer carbonate (RENVELA) 800 MG tablet Take 2,400 mg by mouth 3 (three) times daily with meals. 01/15/20  Yes [provider]   Current Facility-Administered Medications  Medication Dose Route Frequency Provider Last Rate Last Admin   amLODipine (NORVASC) tablet 10 mg  10 mg Oral q morning Chesley Mires, MD   10 mg at 08/31/20 0915   carvedilol (COREG) tablet 25 mg  25 mg Oral BID WC Etta Quill, DO   25 mg at 08/31/20 0840   Chlorhexidine Gluconate Cloth 2 % PADS 6 each  6 each Topical Q0600 [name], MD   6 each at 08/31/20 0630   docusate sodium (COLACE) capsule 100 mg  100 mg Oral BID PRN Estill Cotta, NP       heparin injection 5,000 Units  5,000 Units Subcutaneous Q8H Estill Cotta, NP   5,000 Units at 08/31/20 2585   hydrALAZINE (APRESOLINE) tablet 100 mg  100 mg  Oral TID Etta Quill, DO   100 mg at 08/31/20 2778   methocarbamol (ROBAXIN) tablet 500 mg  500 mg Oral Q8H PRN Chesley Mires, MD       multivitamin (RENA-VIT) tablet 1 tablet  1 tablet Oral QHS Jennette Kettle M, DO       nicardipine (CARDENE) 20mg  in 0.86% saline 266ml IV infusion (0.1 mg/ml)  3-15 mg/hr Intravenous Continuous Palumbo, April, MD   Stopped at 08/31/20 0914   pantoprazole (PROTONIX) EC tablet 40 mg  40 mg Oral Daily Estill Cotta, NP   40 mg at 08/31/20 0915   polyethylene glycol (MIRALAX / GLYCOLAX) packet 17 g  17 g Oral Daily PRN Estill Cotta, NP       polyvinyl alcohol (LIQUIFILM TEARS) 1.4 % ophthalmic solution 1 drop  1 drop Both Eyes Daily PRN Etta Quill, DO       sevelamer carbonate (RENVELA) tablet 2,400 mg  2,400 mg Oral TID WC Jennette Kettle M, DO   2,400 mg at 08/31/20 1311   Labs: Basic Metabolic Panel: Recent Labs  Lab 08/30/20 1935 08/31/20 0245 08/31/20 0440 08/31/20 0627  NA 137 138  --  137  K 6.9* 3.6  --  4.1  CL 96* 97*  --   --   CO2 17*  --   --   --   GLUCOSE 122* 89  --   --   BUN 162* 58*  --   --   CREATININE 24.64* 13.70* 13.79*  --   CALCIUM 7.8*  --   --   --    Liver Function Tests: No results for input(s): AST, ALT, ALKPHOS, BILITOT, PROT, ALBUMIN in the last 168 hours. No results for input(s): LIPASE, AMYLASE in the last 168 hours. No results for input(s): AMMONIA in the last 168 hours. CBC: Recent Labs  Lab 08/30/20 1935 08/31/20 0245 08/31/20 0440 08/31/20 0627  WBC 6.4  --  5.2  --   HGB 8.7* 8.8* 8.0* 9.2*  HCT 25.9* 26.0* 23.6* 27.0*  MCV 90.6  --  89.7  --   PLT 183  --  154  --    Cardiac Enzymes: No results for input(s): CKTOTAL, CKMB, CKMBINDEX, TROPONINI in the last 168 hours. CBG: Recent Labs  Lab 08/31/20 0729 08/31/20 1118  GLUCAP 98 98   Iron Studies: No results for input(s): IRON, TIBC, TRANSFERRIN, FERRITIN in the last 72 hours. Studies/Results: CT HEAD WO CONTRAST (5MM)  Result  Date: 08/31/2020 CLINICAL DATA:  Facial trauma. Dialysis patient with shortness of breath and hypoxia EXAM: CT HEAD WITHOUT CONTRAST TECHNIQUE: Contiguous axial images were obtained from the base of the skull through the vertex without intravenous contrast. COMPARISON:  10/30/2019 FINDINGS: Brain: No evidence of acute infarction, hemorrhage, hydrocephalus, extra-axial collection or mass lesion/mass effect. Vascular: No hyperdense vessel or unexpected calcification. Skull: Normal. Negative for fracture or focal lesion. Sinuses/Orbits: No acute finding. Other: Intermittent streak/motion artifact. IMPRESSION: No acute finding. Electronically Signed   By: Monte Fantasia M.D.   On: 08/31/2020 04:49   DG Chest Portable 1 View  Result Date: 08/31/2020 CLINICAL DATA:  Post dialysis, hypoxia, dyspnea EXAM: PORTABLE CHEST 1 VIEW COMPARISON:  08/30/2020 FINDINGS: Lungs are clear. No pneumothorax or pleural effusion. Mild cardiomegaly is stable. Pulmonary vascularity is normal. No acute bone abnormality. IMPRESSION: No active disease.  Stable cardiomegaly. Electronically Signed   By: Fidela Salisbury M.D.   On: 08/31/2020 03:17   DG Chest Port 1 View  Result Date: 08/30/2020 CLINICAL DATA:  The pt is a dialysius pt that has been on the road and has not had dialysis since last week very sob back pain 02 sats low nasal 02 in triage EXAM: PORTABLE CHEST 1 VIEW COMPARISON:  08/02/2020. FINDINGS: Mild enlargement of the cardiac silhouette, stable. No mediastinal or hilar masses. Clear lungs.  No pleural effusion or pneumothorax. Skeletal structures are grossly intact. IMPRESSION: No acute cardiopulmonary disease. Electronically Signed   By: Lajean Manes M.D.   On: 08/30/2020 19:58    ROS: As per HPI otherwise negative.   Physical Exam: Vitals:   08/31/20 1100 08/31/20 1158 08/31/20 1200 08/31/20 1300  BP: (!) 165/101  (!) 157/87 (!) 168/92  Pulse: 65  61 65  Resp:   18   Temp:  98.4 F (36.9 C)    TempSrc:  Oral     SpO2: 95%  96% 92%  Weight:      Height:         General: Well developed, well nourished, in no acute distress. Head: Normocephalic, atraumatic, sclera non-icteric, mucus membranes are moist Neck: Supple. JVD not elevated. Lungs: Clear bilaterally to auscultation without wheezes, rales, or rhonchi. Breathing is unlabored. Heart: RRR with S1 S2. No murmurs, rubs, or gallops appreciated. Abdomen: Soft, non-tender, non-distended with normoactive bowel sounds. No rebound/guarding. No obvious abdominal masses. M-S:  Strength and tone appear normal for age. Lower extremities:without edema or ischemic changes, no open wounds  Neuro: Alert and oriented X 3. Moves all extremities spontaneously. Psych:  Responds to questions appropriately with a normal affect. Dialysis Access: R AVF + T/B  Dialysis Orders: Center: [facility] T,Th,S 4 hr 15 min 200NRe 400/800 75 kg 2.0K/2.0 Ca R AVF -Heparin 6000 units IV TIW -Hectorol 9 mcg IV TIW -Mircera 50 mcg IV q 2 weeks (last dose 07/24/2020)  -Venofer 50 mg IV q week   AMS-most likely D/T uremia. SCr 24 BUN 162 on adm. Resolved with HD. Neurology consulted. Per primary Hypertensive urgency: Resolved with cardene gtt/HD Hyperkalemia: Resolved with Lokelma/HD.  ESRD -  Continue T,Th,S schedule. No acute HD needs today. Next HD 09/02/2020  Hypertension/volume  - Net UF with HD 4 liters 08/30/20, now under OP EDW 72.7 kgs. Prescribed amlodipine 10 mg PO q HS, Carvedilol 25 mg PO BID and Hydralazine 100 mg PO TID. Home meds resumed.   Anemia  - HGB 8.0. Give ESA with next HD. Ordered.   Metabolic bone disease - C Ca 8.2 Last OP PO4 high 9.4 08/14/2020. Continue binders/VDRA  Nutrition - Renal diet with fluid restrictions.   Rita H. Owens Shark, NP-C 08/31/2020, 1:57 PM  [facility] Beeper [phone]  Nephrology attending: Patient was seen and examined in ICU.  Chart reviewed.  I agree with assessment plan as outlined above. 60 year old ESRD on  HD noncompliant with outpatient dialysis, missed for a week presented with shortness of breath, hypertensive urgency, hyperkalemia etc.  He received urgent dialysis overnight and tolerated well.  Required adjustment of antihypertensives.  It seems like his mental status improved after dialysis which suggested metabolic encephalopathy/uremia.  We will plan for next dialysis tomorrow.  I discussed with the patient about importance of continuing outpatient dialysis.  Continue current medication and he is closely monitored in ICU.  Katheran James, MD [facility].

## 2020-08-31 NOTE — ED Provider Notes (Addendum)
Patient received back from dialysis.  No complaints at this time but having trouble staying awake and following the conversation and is on 5L O2 but has no h/o chronic Oxygen use.  No complaints at this time but continues to fall asleep.     Exam: NCAT PERRL Neck is supple RRR CTAB NABS, soft FROM x 4  Non focal exam 2+ DTRs   Medications  Chlorhexidine Gluconate Cloth 2 % PADS 6 each (has no administration in time range)  carvedilol (COREG) tablet 25 mg (has no administration in time range)  hydrALAZINE (APRESOLINE) tablet 100 mg (has no administration in time range)  sevelamer carbonate (RENVELA) tablet 2,400 mg (has no administration in time range)  multivitamin (RENA-VIT) tablet 1 tablet (has no administration in time range)  polyvinyl alcohol (LIQUIFILM TEARS) 1.4 % ophthalmic solution 1 drop (has no administration in time range)  nicardipine (CARDENE) 20mg  in 0.86% saline 260ml IV infusion (0.1 mg/ml) (has no administration in time range)  hydrALAZINE (APRESOLINE) injection 10 mg (10 mg Intravenous Given 08/30/20 1957)  sodium zirconium cyclosilicate (LOKELMA) packet 10 g (10 g Oral Given 08/30/20 2127)  cloNIDine (CATAPRES) tablet 0.1 mg (0.1 mg Oral Given 08/31/20 0010)  hydrALAZINE (APRESOLINE) injection 5 mg (5 mg Intravenous Given 08/31/20 0253)  nitroGLYCERIN (NITROGLYN) 2 % ointment 1 inch (1 inch Topical Given 08/31/20 0253)  labetalol (NORMODYNE) injection 20 mg (20 mg Intravenous Given 08/31/20 0076)    Results for orders placed or performed during the hospital encounter of 08/30/20  Resp Panel by RT-PCR (Flu A&B, Covid) Nasopharyngeal Swab   Specimen: Nasopharyngeal Swab; Nasopharyngeal(NP) swabs in vial transport medium  Result Value Ref Range   SARS Coronavirus 2 by RT PCR NEGATIVE NEGATIVE   Influenza A by PCR NEGATIVE NEGATIVE   Influenza B by PCR NEGATIVE NEGATIVE  Basic metabolic panel  Result Value Ref Range   Sodium 137 135 - 145 mmol/L   Potassium 6.9 (HH)  3.5 - 5.1 mmol/L   Chloride 96 (L) 98 - 111 mmol/L   CO2 17 (L) 22 - 32 mmol/L   Glucose, Bld 122 (H) 70 - 99 mg/dL   BUN 162 (H) 6 - 20 mg/dL   Creatinine, Ser 24.64 (H) 0.61 - 1.24 mg/dL   Calcium 7.8 (L) 8.9 - 10.3 mg/dL   GFR, Estimated 2 (L) >60 mL/min   Anion gap 24 (H) 5 - 15  CBC  Result Value Ref Range   WBC 6.4 4.0 - 10.5 K/uL   RBC 2.86 (L) 4.22 - 5.81 MIL/uL   Hemoglobin 8.7 (L) 13.0 - 17.0 g/dL   HCT 25.9 (L) 39.0 - 52.0 %   MCV 90.6 80.0 - 100.0 fL   MCH 30.4 26.0 - 34.0 pg   MCHC 33.6 30.0 - 36.0 g/dL   RDW 13.6 11.5 - 15.5 %   Platelets 183 150 - 400 K/uL   nRBC 0.0 0.0 - 0.2 %  Brain natriuretic peptide  Result Value Ref Range   B Natriuretic Peptide 3,564.1 (H) 0.0 - 100.0 pg/mL  I-stat chem 8, ED (not at [facility] or [facility])  Result Value Ref Range   Sodium 138 135 - 145 mmol/L   Potassium 3.6 3.5 - 5.1 mmol/L   Chloride 97 (L) 98 - 111 mmol/L   BUN 58 (H) 6 - 20 mg/dL   Creatinine, Ser 13.70 (H) 0.61 - 1.24 mg/dL   Glucose, Bld 89 70 - 99 mg/dL   Calcium, Ion 0.97 (L) 1.15 - 1.40 mmol/L  TCO2 26 22 - 32 mmol/L   Hemoglobin 8.8 (L) 13.0 - 17.0 g/dL   HCT 26.0 (L) 39.0 - 52.0 %  Troponin I (High Sensitivity)  Result Value Ref Range   Troponin I (High Sensitivity) 115 (HH) <18 ng/L   DG Chest Port 1 View  Result Date: 08/30/2020 CLINICAL DATA:  The pt is a dialysius pt that has been on the road and has not had dialysis since last week very sob back pain 02 sats low nasal 02 in triage EXAM: PORTABLE CHEST 1 VIEW COMPARISON:  08/02/2020. FINDINGS: Mild enlargement of the cardiac silhouette, stable. No mediastinal or hilar masses. Clear lungs.  No pleural effusion or pneumothorax. Skeletal structures are grossly intact. IMPRESSION: No acute cardiopulmonary disease. Electronically Signed   By: Lajean Manes M.D.   On: 08/30/2020 19:58   DG Chest Port 1 View  Result Date: 08/02/2020 CLINICAL DATA:  Shortness breath EXAM: PORTABLE CHEST 1 VIEW COMPARISON:  07/17/2020  FINDINGS: No focal consolidative process is seen. There is some fine interstitial opacity and pulmonary vascular congestion which could reflect early interstitial edematous change. Partial obscuration of the left costophrenic sulcus, could reflect some trace layering pleural effusion. Right costophrenic sulcus more well-defined. No visible pneumothorax. Suspect enlarged cardiac silhouette though may be accentuated by portable technique. Remaining cardiomediastinal contours are similar to comparison prior. No acute or worrisome abnormality of the chest wall. Telemetry leads and support devices overlie the chest. IMPRESSION: Mild pulmonary vascular congestion with some fine interstitial opacity, could reflect early edema. Cardiomegaly, similar to comparison prior. Electronically Signed   By: Lovena Le M.D.   On: 08/02/2020 22:26    3 am Case d/w Dr. Carolin Sicks, patient has a h/o non compliance if altered and HTN will need observation    MDM Reviewed: previous chart, nursing note and vitals Reviewed previous: labs Interpretation: labs, x-ray, ECG and CT scan Total time providing critical care: 30-74 minutes (cardene drip). This excludes time spent performing separately reportable procedures and services. Consults: critical care CRITICAL CARE Performed by: Alexina Niccoli K Nafisa Olds-Rasch Total critical care time: 5minutes Critical care time was exclusive of separately billable procedures and treating other patients. Critical care was necessary to treat or prevent imminent or life-threatening deterioration. Critical care was time spent personally by me on the following activities: development of treatment plan with patient and/or surrogate as well as nursing, discussions with consultants, evaluation of patient's response to treatment, examination of patient, obtaining history from patient or surrogate, ordering and performing treatments and interventions, ordering and review of laboratory studies, ordering and  review of radiographic studies, pulse oximetry and re-evaluation of patient's condition.        Iden Stripling, MD 08/31/20 8882

## 2020-08-31 NOTE — H&P (Signed)
NAME:  Jason Drewes., MRN:  [id number], DOB:  1960/11/11, LOS: 0 ADMISSION DATE:  08/30/2020, CONSULTATION DATE:  8/21 REFERRING MD:  Randal Buba MD, CHIEF COMPLAINT:  Missed HD   History of Present Illness:  60 yo male presented to ED with headache and altered mental status after missing dialysis for last 3 sessions.  Found to have hypertensive emergency with BP 233/117.  Started on cardene gtt.  Seen by neurology and nephrology.  Admitted to ICU and had emergent dialysis session.  Pertinent  Medical History  ESRD on dialysis, HTN, CHF, anemia, low back pain  Significant Hospital Events: Including procedures, antibiotic start and stop dates in addition to other pertinent events   8/20 admit, start cardene, emergent HD 8/21 mental status better, weaning off cardene  Interim History / Subjective:  Headache better.  Denies chest pain, nausea, dyspnea.  Objective   Blood pressure (!) 181/100, pulse 87, temperature 98.5 F (36.9 C), temperature source Axillary, resp. rate 20, height 6\' 2"  (1.88 m), weight 72.7 kg, SpO2 95 %. On 5L [place]        Intake/Output Summary (Last 24 hours) at 08/31/2020 0853 Last data filed at 08/31/2020 0100 Gross per 24 hour  Intake --  Output 4000 ml  Net -4000 ml   Filed Weights   08/30/20 1925 08/30/20 2150 08/31/20 0100  Weight: 76.7 kg 76.7 kg 72.7 kg    Examination:  General - alert Eyes - pupils reactive ENT - no sinus tenderness, no stridor Cardiac - regular rate/rhythm, no murmur Chest - equal breath sounds b/l, no wheezing or rales Abdomen - soft, non tender, + bowel sounds Extremities - AV graft Rt arm Skin - no rashes Neuro - normal strength, moves extremities, follows commands Psych - normal mood and behavior   Resolved Hospital Problem list   Acute hypoxic respiratory failure from acute pulmonary edema, Elevated troponin from demand ischemia, Hyperkalemia  Assessment & Plan:   Acute metabolic encephalopathy 2nd to hypertensive  emergency and uremia. - mental status improved 8/21 - defer to neurology whether MRI brain still needed  Hypertensive emergency. Acute on chronic diastolic CHF. - wean cardene to keep SBP < 180 - continue norvasc, coreg, hydralazine  ESRD. - iHD per nephrology  Anemia of critical illness and chronic disease. - f/u CBC - transfuse for Hb < 7  Will have [facility] assume care from 8/22 and PCCM off.  Best Practice (right click and "Reselect all SmartList Selections" daily)   Diet/type: Regular consistency (see orders) and NPO w/ oral meds DVT prophylaxis: prophylactic heparin  GI prophylaxis: PPI Lines: N/A Foley:  N/A Code Status:  full code Last date of multidisciplinary goals of care discussion [pending]  Labs    CMP Latest Ref Rng & Units 08/31/2020 08/31/2020 08/31/2020  Glucose 70 - 99 mg/dL - - 89  BUN 6 - 20 mg/dL - - 58(H)  Creatinine 0.61 - 1.24 mg/dL - 13.79(H) 13.70(H)  Sodium 135 - 145 mmol/L 137 - 138  Potassium 3.5 - 5.1 mmol/L 4.1 - 3.6  Chloride 98 - 111 mmol/L - - 97(L)  CO2 22 - 32 mmol/L - - -  Calcium 8.9 - 10.3 mg/dL - - -  Total Protein 6.5 - 8.1 g/dL - - -  Total Bilirubin 0.3 - 1.2 mg/dL - - -  Alkaline Phos 38 - 126 U/L - - -  AST 15 - 41 U/L - - -  ALT 0 - 44 U/L - - -  CBC Latest Ref Rng & Units 08/31/2020 08/31/2020 08/31/2020  WBC 4.0 - 10.5 K/uL - 5.2 -  Hemoglobin 13.0 - 17.0 g/dL 9.2(L) 8.0(L) 8.8(L)  Hematocrit 39.0 - 52.0 % 27.0(L) 23.6(L) 26.0(L)  Platelets 150 - 400 K/uL - 154 -    ABG    Component Value Date/Time   PHART 7.457 (H) 08/31/2020 0627   PCO2ART 39.9 08/31/2020 0627   PO2ART 52 (L) 08/31/2020 0627   HCO3 28.2 (H) 08/31/2020 0627   TCO2 29 08/31/2020 0627   ACIDBASEDEF 7.0 (H) 04/26/2019 1414   O2SAT 89.0 08/31/2020 0627    CBG (last 3)  Recent Labs    08/31/20 0729  GLUCAP 98   Signature:  Chesley Mires, MD [place] Pager - [phone] 08/31/2020, 9:03 AM

## 2020-08-31 NOTE — Progress Notes (Signed)
7972: Pt arrived on unit and admitted to Rm 2M10. Pt very drowsy but arousable. Oriented x4 when aroused and reports having no pain or SOB. Cardene infusion running at 1.5, SBP is within goal range (170-180). VSS. Lungs clear, 2LNC. Personal belongings with pt in room. Report given to Lucile Crater, RN at bedside.

## 2020-09-01 ENCOUNTER — Ambulatory Visit (HOSPITAL_COMMUNITY): Payer: Medicare HMO

## 2020-09-01 LAB — RETICULOCYTES
Immature Retic Fract: 3.3 % (ref 2.3–15.9)
RBC.: 2.55 MIL/uL — ABNORMAL LOW (ref 4.22–5.81)
Retic Count, Absolute: 45.9 10*3/uL (ref 19.0–186.0)
Retic Ct Pct: 1.8 % (ref 0.4–3.1)

## 2020-09-01 LAB — BASIC METABOLIC PANEL
Anion gap: 14 (ref 5–15)
BUN: 82 mg/dL — ABNORMAL HIGH (ref 6–20)
CO2: 26 mmol/L (ref 22–32)
Calcium: 7.8 mg/dL — ABNORMAL LOW (ref 8.9–10.3)
Chloride: 96 mmol/L — ABNORMAL LOW (ref 98–111)
Creatinine, Ser: 15.41 mg/dL — ABNORMAL HIGH (ref 0.61–1.24)
GFR, Estimated: 3 mL/min — ABNORMAL LOW (ref >60)
Glucose, Bld: 117 mg/dL — ABNORMAL HIGH (ref 70–99)
Potassium: 5 mmol/L (ref 3.5–5.1)
Sodium: 136 mmol/L (ref 135–145)

## 2020-09-01 LAB — CBC
HCT: 22.7 % — ABNORMAL LOW (ref 39.0–52.0)
Hemoglobin: 7.7 g/dL — ABNORMAL LOW (ref 13.0–17.0)
MCH: 30.4 pg (ref 26.0–34.0)
MCHC: 33.9 g/dL (ref 30.0–36.0)
MCV: 89.7 fL (ref 80.0–100.0)
Platelets: 158 10*3/uL (ref 150–400)
RBC: 2.53 MIL/uL — ABNORMAL LOW (ref 4.22–5.81)
RDW: 13.5 % (ref 11.5–15.5)
WBC: 5.2 10*3/uL (ref 4.0–10.5)
nRBC: 0 % (ref 0.0–0.2)

## 2020-09-01 LAB — PHOSPHORUS: Phosphorus: 9.9 mg/dL — ABNORMAL HIGH (ref 2.5–4.6)

## 2020-09-01 LAB — VITAMIN B12: Vitamin B-12: 518 pg/mL (ref 180–914)

## 2020-09-01 LAB — FERRITIN: Ferritin: 1224 ng/mL — ABNORMAL HIGH (ref 24–336)

## 2020-09-01 LAB — IRON AND TIBC
Iron: 65 ug/dL (ref 45–182)
Saturation Ratios: 41 % — ABNORMAL HIGH (ref 17.9–39.5)
TIBC: 158 ug/dL — ABNORMAL LOW (ref 250–450)
UIBC: 93 ug/dL

## 2020-09-01 LAB — FOLATE: Folate: 25 ng/mL (ref >5.9)

## 2020-09-01 LAB — MAGNESIUM: Magnesium: 2.4 mg/dL (ref 1.7–2.4)

## 2020-09-01 NOTE — Progress Notes (Signed)
Subjective: No complaints, tolerated breakfast he said, no shortness of breath no chest pain ,currently on phone trying to work out with work schedule so he does not miss more.  HD Next dialysis would be tomorrow Tuesday feels okay for discharge  Objective Vital signs in last 24 hours: Vitals:   08/31/20 1800 08/31/20 2000 08/31/20 2117 09/01/20 0036  BP: (!) 154/91 (!) 146/87 (!) 151/86 (!) 149/72  Pulse: 70 67 61 62  Resp:   18 18  Temp:   97.6 F (36.4 C) 97.9 F (36.6 C)  TempSrc:   Oral Oral  SpO2: 92% 94% 99% 98%  Weight:      Height:       Weight change:   Physical Exam: General: Alert well-developed well-nourished adult male in NAD Heart: RRR no MRG Lungs: CTA nonlabored breathing Abdomen: Bowel sounds normoactive, soft NTND Extremities: No pedal edema  dialysis Access: Right upper arm AV fistula positive bruit, large aneurysms but no thinning or scabbing   OP dialysis Orders: Center: [facility] T,Th,S 4 hr 15 min 200NRe 400/800 75 kg 2.0K/2.0 Ca R AVF -Heparin 6000 units IV TIW -Hectorol 9 mcg IV TIW -Mircera 50 mcg IV q 2 weeks (last dose 07/24/2020)  -Venofer 50 mg IV q week  Problem/Plan: AMS-most likely D/T uremia. SCr 24 BUN 162 on adm. Resolved with HD. Neurology consulted. Per primary okay for discharge per renal BUN improved today 82, creatinine 15.4 Hypertensive urgency: Resolved with cardene gtt/HD Hyperkalemia: Resolved with [place]/HD.  K5.0  ESRD -  Continue T,Th,S schedule. No HD needs today. Next HD 09/02/2020  Hypertension/volume  - Net UF with HD 4 liters 08/30/20, now under OP EDW 72.7 kgs.  Meds =amlodipine 10 mg PO q HS, Carvedilol 25 mg PO BID and Hydralazine 100 mg PO TID. Home meds resumed.   Anemia  - HGB 7.7 give ESA with next HD. Ordered.   Metabolic bone disease - C Ca 8.6Last phosphorus 9.9, consistent with OP PO4 high 9.4 08/14/2020. Continue binders/VDRA  Nutrition - Renal diet with fluid restrictions.   Okay for discharge per renal he  knows to attend dialysis tomorrow  Ernest Haber, PA-C [place] [phone] 09/01/2020,8:04 AM  LOS: 1 day   Labs: Basic Metabolic Panel: Recent Labs  Lab 08/30/20 1935 08/31/20 0245 08/31/20 0440 08/31/20 0627 09/01/20 0054  NA 137 138  --  137 136  K 6.9* 3.6  --  4.1 5.0  CL 96* 97*  --   --  96*  CO2 17*  --   --   --  26  GLUCOSE 122* 89  --   --  117*  BUN 162* 58*  --   --  82*  CREATININE 24.64* 13.70* 13.79*  --  15.41*  CALCIUM 7.8*  --   --   --  7.8*  PHOS  --   --   --   --  9.9*   Liver Function Tests: No results for input(s): AST, ALT, ALKPHOS, BILITOT, PROT, ALBUMIN in the last 168 hours. No results for input(s): LIPASE, AMYLASE in the last 168 hours. No results for input(s): AMMONIA in the last 168 hours. CBC: Recent Labs  Lab 08/30/20 1935 08/31/20 0245 08/31/20 0440 08/31/20 0627 09/01/20 0054  WBC 6.4  --  5.2  --  5.2  HGB 8.7*   < > 8.0* 9.2* 7.7*  HCT 25.9*   < > 23.6* 27.0* 22.7*  MCV 90.6  --  89.7  --  89.7  PLT 183  --  154  --  158   < > = values in this interval not displayed.   Cardiac Enzymes: No results for input(s): CKTOTAL, CKMB, CKMBINDEX, TROPONINI in the last 168 hours. CBG: Recent Labs  Lab 08/31/20 0729 08/31/20 1118 08/31/20 1511  GLUCAP 98 98 157*    Studies/Results: CT HEAD WO CONTRAST (5MM)  Result Date: 08/31/2020 CLINICAL DATA:  Facial trauma. Dialysis patient with shortness of breath and hypoxia EXAM: CT HEAD WITHOUT CONTRAST TECHNIQUE: Contiguous axial images were obtained from the base of the skull through the vertex without intravenous contrast. COMPARISON:  10/30/2019 FINDINGS: Brain: No evidence of acute infarction, hemorrhage, hydrocephalus, extra-axial collection or mass lesion/mass effect. Vascular: No hyperdense vessel or unexpected calcification. Skull: Normal. Negative for fracture or focal lesion. Sinuses/Orbits: No acute finding. Other: Intermittent streak/motion artifact.  IMPRESSION: No acute finding. Electronically Signed   By: Monte Fantasia M.D.   On: 08/31/2020 04:49   DG Chest Portable 1 View  Result Date: 08/31/2020 CLINICAL DATA:  Post dialysis, hypoxia, dyspnea EXAM: PORTABLE CHEST 1 VIEW COMPARISON:  08/30/2020 FINDINGS: Lungs are clear. No pneumothorax or pleural effusion. Mild cardiomegaly is stable. Pulmonary vascularity is normal. No acute bone abnormality. IMPRESSION: No active disease.  Stable cardiomegaly. Electronically Signed   By: Fidela Salisbury M.D.   On: 08/31/2020 03:17   DG Chest Port 1 View  Result Date: 08/30/2020 CLINICAL DATA:  The pt is a dialysius pt that has been on the road and has not had dialysis since last week very sob back pain 02 sats low nasal 02 in triage EXAM: PORTABLE CHEST 1 VIEW COMPARISON:  08/02/2020. FINDINGS: Mild enlargement of the cardiac silhouette, stable. No mediastinal or hilar masses. Clear lungs.  No pleural effusion or pneumothorax. Skeletal structures are grossly intact. IMPRESSION: No acute cardiopulmonary disease. Electronically Signed   By: Lajean Manes M.D.   On: 08/30/2020 19:58   Medications:   amLODipine  10 mg Oral q morning   carvedilol  25 mg Oral BID WC   Chlorhexidine Gluconate Cloth  6 each Topical Q0600   [START ON 09/02/2020] darbepoetin (ARANESP) injection - DIALYSIS  100 mcg Intravenous Q Tue-HD   heparin  5,000 Units Subcutaneous Q8H   hydrALAZINE  100 mg Oral TID   multivitamin  1 tablet Oral QHS   pantoprazole  40 mg Oral Daily   sevelamer carbonate  2,400 mg Oral TID WC

## 2020-09-01 NOTE — Consult Note (Signed)
   [facility] CM Inpatient Consult   09/01/2020  Jason [name] 08-06-1960 [id number]  [facility] [ACO] Patient: [employer]  Primary Care Provider:  [name], FNP (Inactive), [facility]  Patient screened for less than 30 days readmission with 3 hospitalizations in the past 6 months.  Patient with noted high risk score for unplanned readmission risk.  Patient was assessed  for potential [facility] Management service needs for post hospital transition.  Review of patient's medical record reveals patient is for home patient has missed HD treatments.  Patient had already transitioned home.   Plan: Will assign for [facility] Telephonic RN for follow up with high risk patient.  For questions contact:   Natividad Brood, RN BSN [facility] Liaison  [phone] business mobile phone Toll free office [phone]  Fax number: [phone] [email or web address]

## 2020-09-01 NOTE — Discharge Summary (Signed)
Physician Discharge Summary  Jason [name]. TOI:[id number] DOB: Oct 03, 1960 DOA: 08/30/2020  PCP: [name], FNP (Inactive)  Admit date: 08/30/2020 Discharge date: 09/01/2020  Time spent: 46 minutes  Recommendations for Outpatient Follow-up:  Follow-up in the outpatient setting with nephrology to further adjust meds and volume status Will need IV iron replaced in the outpatient setting based on this hospitalization/   Discharge Diagnoses:  MAIN problem for hospitalization   Decompensated cardiorenal syndrome secondary to lack of dialysis and hypoxic respiratory failure on admission in addition to hypertensive emergency requiring ICU stay  Please see below for itemized issues addressed in HOpsital- refer to other progress notes for clarity if needed  Discharge Condition: Much improved  Diet recommendation: Renal  Filed Weights   08/30/20 1925 08/30/20 2150 08/31/20 0100  Weight: 76.7 kg 76.7 kg 72.7 kg    History of present illness:  60 year old Black ESRD patient missed dialysis 3 times in a row and developed hypertensive urgency in the 230 range and became somewhat lethargic-found to have potassium 6 BUN 162 had emergent dialysis 4 L removed Admitted by critical care placed on Cardene drip Forgot to take meds He actually is trying to get back into tracking and missed dialysis 3 times in a row Patient resolved very quickly after removal of HD-confusion resolved completely he was completely coherent on day of discharge-PCCM signed off renal signed off and felt that he could dialyze at his home unit on 8/23-he was discharged home in a stable state and had met maximal hospital benefit on discharge   Discharge Exam: Vitals:   09/01/20 0036 09/01/20 0800  BP: (!) 149/72 (!) 178/96  Pulse: 62 73  Resp: 18 20  Temp: 97.9 F (36.6 C) 98.2 F (36.8 C)  SpO2: 98% 100%    Subj on day of d/c   Awake coherent no distress looks well sitting up in bed  General Exam on  discharge  EOMI NCAT no focal deficit S1-S2 no murmur no rub no gallop ROM intact chest clear no added sound no rales no rhonchi No lower extremity edema neurologically intact Large fistula/graft in place on the right side Euthymic pleasant coherent No rash  Discharge Instructions   Discharge Instructions     Diet - low sodium heart healthy   Complete by: As directed    Discharge instructions   Complete by: As directed    Make sure you follow-up with your outpatient physician and get dialysis as required Please do not miss dialysis you may need to think of a different line of work Continue all your blood pressure meds as indicated Your outpatient doctors will determine if you need further IV iron Best of luck have a good fall   Increase activity slowly   Complete by: As directed       Allergies as of 09/01/2020       Reactions   Lisinopril Cough        Medication List     TAKE these medications    amLODipine 10 MG tablet Commonly known as: NORVASC Take 10 mg by mouth every morning.   carvedilol 25 MG tablet Commonly known as: COREG Take 1 tablet (25 mg total) by mouth 2 (two) times daily with a meal.   doxercalciferol 0.5 MCG capsule Commonly known as: HECTOROL At dialysis   hydrALAZINE 100 MG tablet Commonly known as: APRESOLINE Take 1 tablet (100 mg total) by mouth 3 (three) times daily. What changed: when to take this  iron sucrose in sodium chloride 0.9 % 100 mL Iron Sucrose (Venofer)   methocarbamol 500 MG tablet Commonly known as: ROBAXIN Take 1 tablet (500 mg total) by mouth every 8 (eight) hours as needed for muscle spasms.   MIRCERA IJ Mircera   multivitamin Tabs tablet Take 1 tablet by mouth every morning.   polyvinyl alcohol 1.4 % ophthalmic solution Commonly known as: LIQUIFILM TEARS Place 1 drop into both eyes daily as needed for dry eyes.   sevelamer carbonate 800 MG tablet Commonly known as: RENVELA Take 2,400 mg by mouth 3  (three) times daily with meals.       Allergies  Allergen Reactions   Lisinopril Cough      The results of significant diagnostics from this hospitalization (including imaging, microbiology, ancillary and laboratory) are listed below for reference.    Significant Diagnostic Studies: CT HEAD WO CONTRAST (5MM)  Result Date: 08/31/2020 CLINICAL DATA:  Facial trauma. Dialysis patient with shortness of breath and hypoxia EXAM: CT HEAD WITHOUT CONTRAST TECHNIQUE: Contiguous axial images were obtained from the base of the skull through the vertex without intravenous contrast. COMPARISON:  10/30/2019 FINDINGS: Brain: No evidence of acute infarction, hemorrhage, hydrocephalus, extra-axial collection or mass lesion/mass effect. Vascular: No hyperdense vessel or unexpected calcification. Skull: Normal. Negative for fracture or focal lesion. Sinuses/Orbits: No acute finding. Other: Intermittent streak/motion artifact. IMPRESSION: No acute finding. Electronically Signed   By: Monte Fantasia M.D.   On: 08/31/2020 04:49   DG Chest Portable 1 View  Result Date: 08/31/2020 CLINICAL DATA:  Post dialysis, hypoxia, dyspnea EXAM: PORTABLE CHEST 1 VIEW COMPARISON:  08/30/2020 FINDINGS: Lungs are clear. No pneumothorax or pleural effusion. Mild cardiomegaly is stable. Pulmonary vascularity is normal. No acute bone abnormality. IMPRESSION: No active disease.  Stable cardiomegaly. Electronically Signed   By: Fidela Salisbury M.D.   On: 08/31/2020 03:17   DG Chest Port 1 View  Result Date: 08/30/2020 CLINICAL DATA:  The pt is a dialysius pt that has been on the road and has not had dialysis since last week very sob back pain 02 sats low nasal 02 in triage EXAM: PORTABLE CHEST 1 VIEW COMPARISON:  08/02/2020. FINDINGS: Mild enlargement of the cardiac silhouette, stable. No mediastinal or hilar masses. Clear lungs.  No pleural effusion or pneumothorax. Skeletal structures are grossly intact. IMPRESSION: No acute  cardiopulmonary disease. Electronically Signed   By: Lajean Manes M.D.   On: 08/30/2020 19:58   DG Chest Port 1 View  Result Date: 08/02/2020 CLINICAL DATA:  Shortness breath EXAM: PORTABLE CHEST 1 VIEW COMPARISON:  07/17/2020 FINDINGS: No focal consolidative process is seen. There is some fine interstitial opacity and pulmonary vascular congestion which could reflect early interstitial edematous change. Partial obscuration of the left costophrenic sulcus, could reflect some trace layering pleural effusion. Right costophrenic sulcus more well-defined. No visible pneumothorax. Suspect enlarged cardiac silhouette though may be accentuated by portable technique. Remaining cardiomediastinal contours are similar to comparison prior. No acute or worrisome abnormality of the chest wall. Telemetry leads and support devices overlie the chest. IMPRESSION: Mild pulmonary vascular congestion with some fine interstitial opacity, could reflect early edema. Cardiomegaly, similar to comparison prior. Electronically Signed   By: Lovena Le M.D.   On: 08/02/2020 22:26    Microbiology: Recent Results (from the past 240 hour(s))  Resp Panel by RT-PCR (Flu A&B, Covid) Nasopharyngeal Swab     Status: None   Collection Time: 08/30/20  8:00 PM   Specimen: Nasopharyngeal Swab; Nasopharyngeal(NP) swabs  in vial transport medium  Result Value Ref Range Status   SARS Coronavirus 2 by RT PCR NEGATIVE NEGATIVE Final    Comment: (NOTE) SARS-CoV-2 target nucleic acids are NOT DETECTED.  The SARS-CoV-2 RNA is generally detectable in upper respiratory specimens during the acute phase of infection. The lowest concentration of SARS-CoV-2 viral copies this assay can detect is 138 copies/mL. A negative result does not preclude SARS-Cov-2 infection and should not be used as the sole basis for treatment or other patient management decisions. A negative result may occur with  improper specimen collection/handling, submission of  specimen other than nasopharyngeal swab, presence of viral mutation(s) within the areas targeted by this assay, and inadequate number of viral copies(<138 copies/mL). A negative result must be combined with clinical observations, patient history, and epidemiological information. The expected result is Negative.  Fact Sheet for Patients:  EntrepreneurPulse.com.au  Fact Sheet for Healthcare Providers:  IncredibleEmployment.be  This test is no t yet approved or cleared by the [place] FDA and  has been authorized for detection and/or diagnosis of SARS-CoV-2 by FDA under an Emergency Use Authorization (EUA). This EUA will remain  in effect (meaning this test can be used) for the duration of the COVID-19 declaration under Section 564(b)(1) of the Act, 21 U.S.C.section 360bbb-3(b)(1), unless the authorization is terminated  or revoked sooner.       Influenza A by PCR NEGATIVE NEGATIVE Final   Influenza B by PCR NEGATIVE NEGATIVE Final    Comment: (NOTE) The Xpert Xpress SARS-CoV-2/FLU/RSV plus assay is intended as an aid in the diagnosis of influenza from Nasopharyngeal swab specimens and should not be used as a sole basis for treatment. Nasal washings and aspirates are unacceptable for Xpert Xpress SARS-CoV-2/FLU/RSV testing.  Fact Sheet for Patients: EntrepreneurPulse.com.au  Fact Sheet for Healthcare Providers: IncredibleEmployment.be  This test is not yet approved or cleared by the [place] FDA and has been authorized for detection and/or diagnosis of SARS-CoV-2 by FDA under an Emergency Use Authorization (EUA). This EUA will remain in effect (meaning this test can be used) for the duration of the COVID-19 declaration under Section 564(b)(1) of the Act, 21 U.S.C. section 360bbb-3(b)(1), unless the authorization is terminated or revoked.  Performed at [facility], [place] [address],  [place], [place] [address]   MRSA Next Gen by PCR, Nasal     Status: None   Collection Time: 08/31/20  6:15 AM   Specimen: Nasal Mucosa; Nasal Swab  Result Value Ref Range Status   MRSA by PCR Next Gen NOT DETECTED NOT DETECTED Final    Comment: (NOTE) The GeneXpert MRSA Assay (FDA approved for NASAL specimens only), is one component of a comprehensive MRSA colonization surveillance program. It is not intended to diagnose MRSA infection nor to guide or monitor treatment for MRSA infections. Test performance is not FDA approved in patients less than 41 years old. Performed at [facility], [place] [address], [place], [place] [address]      Labs: Basic Metabolic Panel: Recent Labs  Lab 08/30/20 1935 08/31/20 0245 08/31/20 0440 08/31/20 0627 09/01/20 0054  NA 137 138  --  137 136  K 6.9* 3.6  --  4.1 5.0  CL 96* 97*  --   --  96*  CO2 17*  --   --   --  26  GLUCOSE 122* 89  --   --  117*  BUN 162* 58*  --   --  82*  CREATININE 24.64*  13.70* 13.79*  --  15.41*  CALCIUM 7.8*  --   --   --  7.8*  MG  --   --   --   --  2.4  PHOS  --   --   --   --  9.9*   Liver Function Tests: No results for input(s): AST, ALT, ALKPHOS, BILITOT, PROT, ALBUMIN in the last 168 hours. No results for input(s): LIPASE, AMYLASE in the last 168 hours. No results for input(s): AMMONIA in the last 168 hours. CBC: Recent Labs  Lab 08/30/20 1935 08/31/20 0245 08/31/20 0440 08/31/20 0627 09/01/20 0054  WBC 6.4  --  5.2  --  5.2  HGB 8.7* 8.8* 8.0* 9.2* 7.7*  HCT 25.9* 26.0* 23.6* 27.0* 22.7*  MCV 90.6  --  89.7  --  89.7  PLT 183  --  154  --  158   Cardiac Enzymes: No results for input(s): CKTOTAL, CKMB, CKMBINDEX, TROPONINI in the last 168 hours. BNP: BNP (last 3 results) Recent Labs    07/17/20 1201 08/02/20 2200 08/30/20 1935  BNP 3,654.1* 1,046.1* 3,564.1*    ProBNP (last 3 results) No results for input(s): PROBNP in the last 8760 hours.  CBG: Recent Labs  Lab 08/31/20 0729  08/31/20 1118 08/31/20 1511  GLUCAP 98 98 157*       Signed:  Nita Sells MD   [facility] 09/01/2020, 10:23 AM

## 2020-09-02 ENCOUNTER — Other Ambulatory Visit: Payer: Self-pay

## 2020-09-02 DIAGNOSIS — N2581 Secondary hyperparathyroidism of renal origin: Secondary | ICD-10-CM | POA: Diagnosis not present

## 2020-09-02 DIAGNOSIS — N186 End stage renal disease: Secondary | ICD-10-CM | POA: Diagnosis not present

## 2020-09-02 DIAGNOSIS — R69 Illness, unspecified: Secondary | ICD-10-CM | POA: Diagnosis not present

## 2020-09-02 DIAGNOSIS — Z992 Dependence on renal dialysis: Secondary | ICD-10-CM | POA: Diagnosis not present

## 2020-09-02 NOTE — Patient Outreach (Signed)
[facility]) Care Management  09/02/2020  Jason [name] 1960/10/31 [id number]   Referral Date: 09/01/20 Referral Source: Hospital liaison Referral Reason: Recent hospitalization: hypertension and missed dialysis appointments.    Outreach Attempt: No answer.  HIPAA compliant voice message left.     Plan: RN CM will attempt again within 4 business days and send letter.   Jason Baseman, RN, MSN [facility] Care Management Care Management Coordinator Direct Line [phone] Toll Free: [phone]  Fax: [phone]

## 2020-09-04 DIAGNOSIS — Z992 Dependence on renal dialysis: Secondary | ICD-10-CM | POA: Diagnosis not present

## 2020-09-04 DIAGNOSIS — N2581 Secondary hyperparathyroidism of renal origin: Secondary | ICD-10-CM | POA: Diagnosis not present

## 2020-09-04 DIAGNOSIS — N186 End stage renal disease: Secondary | ICD-10-CM | POA: Diagnosis not present

## 2020-09-05 ENCOUNTER — Other Ambulatory Visit: Payer: Self-pay

## 2020-09-05 NOTE — Patient Outreach (Signed)
[facility]) Care Management  09/05/2020  Zakariah Urwin 1961-01-02 [id number]   Referral Date: 09/01/20 Referral Source: Hospital liaison Referral Reason: Recent hospitalization: hypertension and missed dialysis appointments.      Outreach Attempt: No answer.  HIPAA compliant voice message left.        Plan: RN CM will attempt again within 4 business days.  [name], RN, MSN [facility] Care Management Coordinator Direct Line [phone] Cell [phone] Toll Free: [phone]  Fax: [phone]

## 2020-09-06 DIAGNOSIS — Z992 Dependence on renal dialysis: Secondary | ICD-10-CM | POA: Diagnosis not present

## 2020-09-06 DIAGNOSIS — R69 Illness, unspecified: Secondary | ICD-10-CM | POA: Diagnosis not present

## 2020-09-06 DIAGNOSIS — N2581 Secondary hyperparathyroidism of renal origin: Secondary | ICD-10-CM | POA: Diagnosis not present

## 2020-09-06 DIAGNOSIS — N186 End stage renal disease: Secondary | ICD-10-CM | POA: Diagnosis not present

## 2020-09-09 DIAGNOSIS — N2581 Secondary hyperparathyroidism of renal origin: Secondary | ICD-10-CM | POA: Diagnosis not present

## 2020-09-09 DIAGNOSIS — Z992 Dependence on renal dialysis: Secondary | ICD-10-CM | POA: Diagnosis not present

## 2020-09-09 DIAGNOSIS — N186 End stage renal disease: Secondary | ICD-10-CM | POA: Diagnosis not present

## 2020-09-10 ENCOUNTER — Ambulatory Visit: Payer: Medicare HMO | Admitting: Medical

## 2020-09-10 DIAGNOSIS — I129 Hypertensive chronic kidney disease with stage 1 through stage 4 chronic kidney disease, or unspecified chronic kidney disease: Secondary | ICD-10-CM | POA: Diagnosis not present

## 2020-09-10 DIAGNOSIS — Z992 Dependence on renal dialysis: Secondary | ICD-10-CM | POA: Diagnosis not present

## 2020-09-10 DIAGNOSIS — N186 End stage renal disease: Secondary | ICD-10-CM | POA: Diagnosis not present

## 2020-09-11 ENCOUNTER — Other Ambulatory Visit: Payer: Self-pay

## 2020-09-11 DIAGNOSIS — Z992 Dependence on renal dialysis: Secondary | ICD-10-CM | POA: Diagnosis not present

## 2020-09-11 DIAGNOSIS — N186 End stage renal disease: Secondary | ICD-10-CM | POA: Diagnosis not present

## 2020-09-11 DIAGNOSIS — N2581 Secondary hyperparathyroidism of renal origin: Secondary | ICD-10-CM | POA: Diagnosis not present

## 2020-09-11 NOTE — Patient Outreach (Signed)
Jason [name]) Care Management  Jason [name]  09/11/2020   [name]. 02/14/1960 [id number]  Subjective: Telephone call to patient for follow up. Patient reports that his work schedule prevented him from going to dialysis as scheduled.  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  This makes going to his hemodialysis treatments easier. He denies transportation problems and states he has been compliant with medications and dialysis treatments since hospital discharge.  Discussed results of not being consistent with dialysis treatments.  He verbalized understanding.   Medical history significant of ESRD on TTS dialysis, HTN, mod PAH, HFpEF (EF 50-55%, grade 1 DD in Feb 2021). COPD. Patient wants to get back on kidney list. Discussed importance of treatment plan compliance in order for that to happen.  He verbalized understanding.    Discussed [facility] services and support.  He is agreeable to care manager telephonic outreach.    Objective:   Encounter Medications:  Outpatient Encounter Medications as of 09/11/2020  Medication Sig Note   amLODipine (NORVASC) 10 MG tablet Take 10 mg by mouth every morning.    carvedilol (COREG) 25 MG tablet Take 1 tablet (25 mg total) by mouth 2 (two) times daily with a meal.    doxercalciferol (HECTOROL) 0.5 MCG capsule At dialysis    iron sucrose in sodium chloride 0.9 % 100 mL Iron Sucrose (Venofer) 08/03/2020: At dialysis?   methocarbamol (ROBAXIN) 500 MG tablet Take 1 tablet (500 mg total) by mouth every 8 (eight) hours as needed for muscle spasms.    Methoxy PEG-Epoetin Beta (MIRCERA IJ) Mircera 08/03/2020: At dialysis?   multivitamin (RENA-VIT) TABS tablet Take 1 tablet by mouth every morning.    polyvinyl alcohol (LIQUIFILM TEARS) 1.4 % ophthalmic solution Place 1 drop into both eyes daily as needed for dry eyes.    hydrALAZINE (APRESOLINE) 100 MG tablet Take 1 tablet (100 mg total) by mouth 3 (three) times daily. (Patient taking  differently: Take 100 mg by mouth 2 (two) times daily.) 09/11/2020: Patient taking   sevelamer carbonate (RENVELA) 800 MG tablet Take 2,400 mg by mouth 3 (three) times daily with meals.    No facility-administered encounter medications on file as of 09/11/2020.    Functional Status:  In your present state of health, do you have any difficulty performing the following activities: 09/11/2020 08/03/2020  Hearing? N N  Vision? N N  Difficulty concentrating or making decisions? N N  Walking or climbing stairs? N N  Dressing or bathing? N N  Doing errands, shopping? N N  Preparing Food and eating ? N -  Using the Toilet? N -  In the past six months, have you accidently leaked urine? N -  Do you have problems with loss of bowel control? N -  Managing your Medications? N -  Managing your Finances? N -  Housekeeping or managing your Housekeeping? N -  Some recent data might be hidden    Fall/Depression Screening: Fall Risk  09/11/2020 08/22/2020 03/19/2019  Falls in the past year? 0 0 0  Number falls in past yr: - 0 0  Injury with Fall? - 0 0   PHQ 2/9 Scores 09/11/2020 08/22/2020 03/19/2019 08/23/2018 05/26/2018 11/22/2017 08/24/2017  PHQ - 2 Score 0 0 0 0 0 0 0    Assessment:   Care Plan Care Plan : General Plan of Care (Adult)  Updates made by Jon Billings, RN since 09/11/2020 12:00 AM     Problem: Therapeutic Alliance (General  Plan of Care)      Goal: Therapeutic Alliance Established as evidenced by patient continuing to engage with care manager   Start Date: 09/11/2020  Expected End Date: 11/10/2020  Priority: High  Note:   Evidence-based guidance:  Avoid value judgments; convey acceptance.  Encourage collaboration with the treatment team.  Establish rapport; develop trust relationship.  Therapist, art.  Provide emotional support; encourage patient to share feelings of anger, fear and anxiety.  Promote self-reliance and autonomy based on age and ability; discourage overprotection.   Use empathy and nonjudgmental, participatory manner.   Notes:     Task: Develop Relationship to Effect Behavior Change   Due Date: 11/10/2020  Priority: Routine  Responsible User: Jon Billings, RN  Note:   Care Management Activities:    - acceptance conveyed - care explained - choices provided - empathic listening provided - questions encouraged - self-reliance encouraged    Notes: 09/11/20 Patient with history of non-compliance.  Patient agreeable to work with CM.     Problem: Health Promotion or Disease Self-Management (General Plan of Care)      Goal: Self-Management Plan Developed as evidenced by no hospitalizations related to missing dialysis appointments.   Start Date: 09/11/2020  Expected End Date: 11/10/2020  Priority: High  Note:   Evidence-based guidance:  Review biopsychosocial determinants of health screens.  Determine level of modifiable health risk.  Assess level of patient activation, level of readiness, importance and confidence to make changes.  Evoke change talk using open-ended questions, pros and cons, as well as looking forward.  Identify areas where behavior change may lead to improved health.  Partner with patient to develop a robust self-management plan that includes lifestyle factors, such as weight loss, exercise and healthy nutrition, as well as goals specific to disease risks.  Support patient and family/caregiver active participation in decision-making and self-management plan.  Implement additional goals and interventions based on identified risk factors to reduce health risk.  Facilitate advance care planning.  Review need for preventive screening based on age, sex, family history and health history.   Notes:     Task: Mutually Develop and Royce Macadamia Achievement of Patient Goals   Due Date: 11/10/2020  Priority: Routine  Responsible User: Jon Billings, RN  Note:   Care Management Activities:    - barriers to meeting goals identified -  change-talk evoked - collaboration with team encouraged - reassurance provided    Notes: 09/11/20 Patient with history of missing dialysis appointments.  Discussed with patient importance of adherence to plan of care concerning ESRD. Patient reports he has changed jobs and plans to be compliant with dialysis treatments.      Care Plan : Hypertension (Adult)  Updates made by Jon Billings, RN since 09/11/2020 12:00 AM     Problem: Hypertension (Hypertension)      Long-Range Goal: Hypertension Monitored as evidenced by blood pressure reports less than 140/80.   Start Date: 09/11/2020  Expected End Date: 01/10/2021  Priority: High  Note:   Evidence-based guidance:  Promote initial use of ambulatory blood pressure measurements (for 3 days) to rule out "white-coat" effect; identify masked hypertension and presence or absence of nocturnal "dipping" of blood pressure.   Encourage continued use of home blood pressure monitoring and recording in blood pressure log; include symptoms of hypotension or potential medication side effects in log.  Review blood pressure measurements taken inside and outside of the provider office; establish baseline and monitor trends; compare to target ranges or patient goal.  Share overall cardiovascular risk with patient; encourage changes to lifestyle risk factors, including alcohol consumption, smoking, inadequate exercise, poor dietary habits and stress.   Notes:     Task: Identify and Monitor Blood Pressure Elevation   Due Date: 01/10/2021  Priority: Routine  Responsible User: Jon Billings, RN  Note:   Care Management Activities:    - depression screen reviewed - home or ambulatory blood pressure monitoring encouraged    Notes: 09/11/20 Patient with long history of hypertension and also non-compliance.  Patient now on track with medications and schedule.  Patient reports blood pressure range 120's/60-70's range and checks at least daily.   Hypertension  Management Discussed Take medications as ordered Limit salt intake. Eat plenty of fruits and vegetables Remain as active as possible Take blood pressure at least weekly at home and record in a notebook to take to doctor's visits.       Goals Addressed               This Visit's Progress     Make and Keep All Appointments (pt-stated)        Barriers: Health Behaviors Knowledge Other-work schedule Timeframe:  Short-Term Goal Priority:  High Start Date:    09/11/20                         Expected End Date:   13/31/22                    Follow Up Date 10/10/20   - keep a calendar with appointment dates - learn the bus route  Notify dialysis center of need to change date/time   Why is this important?   Part of staying healthy is seeing the doctor for follow-up care.  If you forget your appointments, there are some things you can do to stay on track.    Notes: 09/11/20 History of missing appointments due to work schedule.  Changed job to prioritize dialysis treatments. Please keep appointments!!      Track and Manage My Blood Pressure-Hypertension (pt-stated)        Timeframe:  Long-Range Goal Priority:  High Start Date:    09/11/20                         Expected End Date:     01/10/21                  Follow Up Date: 10/10/20   - check blood pressure daily - write blood pressure results in a log or diary    Why is this important?   You won't feel high blood pressure, but it can still hurt your blood vessels.  High blood pressure can cause heart or kidney problems. It can also cause a stroke.  Making lifestyle changes like losing a little weight or eating less salt will help.  Checking your blood pressure at home and at different times of the day can help to control blood pressure.  If the doctor prescribes medicine remember to take it the way the doctor ordered.  Call the office if you cannot afford the medicine or if there are questions about it.     Notes: 09/11/20  Patient with long history of hypertension.  Check pressure at least daily and reports ranging 120's/60-70's.  Situation complicated by work schedule. Encouraged hemodialysis treatment and medication compliance.   Hypertension Management Discussed Take medications as ordered Limit  salt intake. Eat plenty of fruits and vegetables Remain as active as possible Take blood pressure at least weekly at home and record in a notebook to take to doctor's visits.         Plan: RN CM will provide ongoing education and support to patient through phone calls.   RN CM will send welcome packet with consent to patient.   RN CM will send initial barriers letter, assessment, and care plan to primary care physician.   Follow-up: Patient agrees to Care Plan and Follow-up. Follow-up in 2-3 week(s)  [name], RN, MSN [place] Management Care Management Coordinator Direct Line [phone] Cell [phone] Toll Free: [phone]  Fax: [phone]

## 2020-09-11 NOTE — Patient Outreach (Signed)
[facility]) Care Management  09/11/2020  [place] 09-Dec-1960 [id number]   Date of Review: 09/11/20 Reason:  Readmission  PCP:  Kathe Becton, NP Insurance: [employer]  Medical Info: Jason [name]. is a 60 y.o. male with medical history significant of ESRD on TTS dialysis, HTN, mod PAH, HFpEF (EF 50-55%, grade 1 DD in Feb 2021). COPD. Per history he lives alone and still works.    Admissions: Patient with 2 admits in 30 days. Patient admitted to 08/03/20 to 08/05/20 Presented with fatigue, chills, shortness of breath nausea.  Had not had HD since 07/29/20.  He was admitted with severe hyperkalemia and acute respiratory failure.  He was dialyzed, monitored and discharged with follow up to physician.    Patient admitted on 08/30/20 to 09/01/20 with shortness of breath. He reports missing dialysis for a week due to being out of town and could not get back home.  He also reported some noncompliance with his blood pressure medications.   He was diagnosed with hypertensive urgency and altered mental status.  He was dialyzed, treated for hypertension. Patient discharged home with physician follow up and encouraged to keep dialysis appointments.   Disposition: Home with self-care  Recommendation: Contact [name] if needed.  Call dialysis center if cannot reach patient.    [name], RN, MSN [place] Management Care Management Coordinator Direct Line [phone] Cell [phone] Toll Free: [phone]  Fax: [phone]

## 2020-09-11 NOTE — Patient Instructions (Signed)
Goals Addressed               This Visit's Progress     Make and Keep All Appointments (pt-stated)        Barriers: Health Behaviors Knowledge Other-work schedule Timeframe:  Short-Term Goal Priority:  High Start Date:    09/11/20                         Expected End Date:   13/31/22                    Follow Up Date 10/10/20   - keep a calendar with appointment dates - learn the bus route  Notify dialysis center of need to change date/time   Why is this important?   Part of staying healthy is seeing the doctor for follow-up care.  If you forget your appointments, there are some things you can do to stay on track.    Notes: 09/11/20 History of missing appointments due to work schedule.  Changed job to prioritize dialysis treatments. Please keep appointments!!      Track and Manage My Blood Pressure-Hypertension (pt-stated)        Timeframe:  Long-Range Goal Priority:  High Start Date:    09/11/20                         Expected End Date:     01/10/21                  Follow Up Date: 10/10/20   - check blood pressure daily - write blood pressure results in a log or diary    Why is this important?   You won't feel high blood pressure, but it can still hurt your blood vessels.  High blood pressure can cause heart or kidney problems. It can also cause a stroke.  Making lifestyle changes like losing a little weight or eating less salt will help.  Checking your blood pressure at home and at different times of the day can help to control blood pressure.  If the doctor prescribes medicine remember to take it the way the doctor ordered.  Call the office if you cannot afford the medicine or if there are questions about it.     Notes: 09/11/20 Patient with long history of hypertension.  Check pressure at least daily and reports ranging 120's/60-70's.  Situation complicated by work schedule. Encouraged hemodialysis treatment and medication compliance.   Hypertension Management  Discussed Take medications as ordered Limit salt intake. Eat plenty of fruits and vegetables Remain as active as possible Take blood pressure at least weekly at home and record in a notebook to take to doctor's visits.

## 2020-09-13 DIAGNOSIS — N186 End stage renal disease: Secondary | ICD-10-CM | POA: Diagnosis not present

## 2020-09-13 DIAGNOSIS — Z992 Dependence on renal dialysis: Secondary | ICD-10-CM | POA: Diagnosis not present

## 2020-09-13 DIAGNOSIS — N2581 Secondary hyperparathyroidism of renal origin: Secondary | ICD-10-CM | POA: Diagnosis not present

## 2020-09-16 DIAGNOSIS — Z992 Dependence on renal dialysis: Secondary | ICD-10-CM | POA: Diagnosis not present

## 2020-09-16 DIAGNOSIS — N2581 Secondary hyperparathyroidism of renal origin: Secondary | ICD-10-CM | POA: Diagnosis not present

## 2020-09-16 DIAGNOSIS — N186 End stage renal disease: Secondary | ICD-10-CM | POA: Diagnosis not present

## 2020-09-18 DIAGNOSIS — Z992 Dependence on renal dialysis: Secondary | ICD-10-CM | POA: Diagnosis not present

## 2020-09-18 DIAGNOSIS — N2581 Secondary hyperparathyroidism of renal origin: Secondary | ICD-10-CM | POA: Diagnosis not present

## 2020-09-18 DIAGNOSIS — N186 End stage renal disease: Secondary | ICD-10-CM | POA: Diagnosis not present

## 2020-09-20 DIAGNOSIS — N2581 Secondary hyperparathyroidism of renal origin: Secondary | ICD-10-CM | POA: Diagnosis not present

## 2020-09-20 DIAGNOSIS — Z992 Dependence on renal dialysis: Secondary | ICD-10-CM | POA: Diagnosis not present

## 2020-09-20 DIAGNOSIS — N186 End stage renal disease: Secondary | ICD-10-CM | POA: Diagnosis not present

## 2020-09-22 ENCOUNTER — Other Ambulatory Visit: Payer: Self-pay

## 2020-09-22 NOTE — Patient Outreach (Signed)
[place] [facility]) Care Management  09/22/2020  [place] 1960/09/25 [id number]   Telephone call to patient for follow up. No answer. HIPAA compliant message left.    Plan: RN CM will attempt again within 4 business days if no return call.   [name], RN, MSN [place] Management Care Management Coordinator Direct Line [phone] Cell [phone] Toll Free: [phone]  Fax: [phone]

## 2020-09-23 ENCOUNTER — Other Ambulatory Visit: Payer: Self-pay

## 2020-09-23 DIAGNOSIS — N2581 Secondary hyperparathyroidism of renal origin: Secondary | ICD-10-CM | POA: Diagnosis not present

## 2020-09-23 DIAGNOSIS — Z992 Dependence on renal dialysis: Secondary | ICD-10-CM | POA: Diagnosis not present

## 2020-09-23 DIAGNOSIS — N186 End stage renal disease: Secondary | ICD-10-CM | POA: Diagnosis not present

## 2020-09-23 NOTE — Patient Outreach (Signed)
[facility]) Care Management  09/23/2020  [place] [birth year] [id number]   Telephone call to patient for follow up.  No answer. Voice mail full.  SMS notification sent.  Plan: RN CM will attempt again within 4 business days.  [name], RN, MSN [place] Management Care Management Coordinator Direct Line [phone] Cell [phone] Toll Free: [phone]  Fax: [phone]

## 2020-09-24 ENCOUNTER — Other Ambulatory Visit: Payer: Self-pay

## 2020-09-24 NOTE — Patient Outreach (Signed)
[place] [facility]) Care Management  09/24/2020  [place] 07/31/1960 [id number]   Telephone call to patient for follow up.  No answer. Voice mail full.  SMS notification sent.   Plan: RN CM will attempt again in the next 3 weeks and send letter.  [name], RN, MSN [place] Management Care Management Coordinator Direct Line [phone] Cell [phone] Toll Free: [phone]  Fax: [phone]

## 2020-09-25 DIAGNOSIS — N2581 Secondary hyperparathyroidism of renal origin: Secondary | ICD-10-CM | POA: Diagnosis not present

## 2020-09-25 DIAGNOSIS — Z992 Dependence on renal dialysis: Secondary | ICD-10-CM | POA: Diagnosis not present

## 2020-09-25 DIAGNOSIS — N186 End stage renal disease: Secondary | ICD-10-CM | POA: Diagnosis not present

## 2020-09-26 ENCOUNTER — Ambulatory Visit: Payer: Medicare HMO | Admitting: Internal Medicine

## 2020-09-27 DIAGNOSIS — N186 End stage renal disease: Secondary | ICD-10-CM | POA: Diagnosis not present

## 2020-09-27 DIAGNOSIS — N2581 Secondary hyperparathyroidism of renal origin: Secondary | ICD-10-CM | POA: Diagnosis not present

## 2020-09-27 DIAGNOSIS — Z992 Dependence on renal dialysis: Secondary | ICD-10-CM | POA: Diagnosis not present

## 2020-09-30 DIAGNOSIS — Z992 Dependence on renal dialysis: Secondary | ICD-10-CM | POA: Diagnosis not present

## 2020-09-30 DIAGNOSIS — N186 End stage renal disease: Secondary | ICD-10-CM | POA: Diagnosis not present

## 2020-09-30 DIAGNOSIS — N2581 Secondary hyperparathyroidism of renal origin: Secondary | ICD-10-CM | POA: Diagnosis not present

## 2020-10-02 DIAGNOSIS — N2581 Secondary hyperparathyroidism of renal origin: Secondary | ICD-10-CM | POA: Diagnosis not present

## 2020-10-02 DIAGNOSIS — N186 End stage renal disease: Secondary | ICD-10-CM | POA: Diagnosis not present

## 2020-10-02 DIAGNOSIS — Z992 Dependence on renal dialysis: Secondary | ICD-10-CM | POA: Diagnosis not present

## 2020-10-07 DIAGNOSIS — N186 End stage renal disease: Secondary | ICD-10-CM | POA: Diagnosis not present

## 2020-10-07 DIAGNOSIS — N2581 Secondary hyperparathyroidism of renal origin: Secondary | ICD-10-CM | POA: Diagnosis not present

## 2020-10-07 DIAGNOSIS — Z992 Dependence on renal dialysis: Secondary | ICD-10-CM | POA: Diagnosis not present

## 2020-10-08 ENCOUNTER — Ambulatory Visit: Payer: Self-pay | Admitting: Nurse Practitioner

## 2020-10-09 DIAGNOSIS — Z992 Dependence on renal dialysis: Secondary | ICD-10-CM | POA: Diagnosis not present

## 2020-10-09 DIAGNOSIS — N2581 Secondary hyperparathyroidism of renal origin: Secondary | ICD-10-CM | POA: Diagnosis not present

## 2020-10-09 DIAGNOSIS — N186 End stage renal disease: Secondary | ICD-10-CM | POA: Diagnosis not present

## 2020-10-10 DIAGNOSIS — I129 Hypertensive chronic kidney disease with stage 1 through stage 4 chronic kidney disease, or unspecified chronic kidney disease: Secondary | ICD-10-CM | POA: Diagnosis not present

## 2020-10-10 DIAGNOSIS — N186 End stage renal disease: Secondary | ICD-10-CM | POA: Diagnosis not present

## 2020-10-10 DIAGNOSIS — Z992 Dependence on renal dialysis: Secondary | ICD-10-CM | POA: Diagnosis not present

## 2020-10-11 DIAGNOSIS — N2581 Secondary hyperparathyroidism of renal origin: Secondary | ICD-10-CM | POA: Diagnosis not present

## 2020-10-11 DIAGNOSIS — Z992 Dependence on renal dialysis: Secondary | ICD-10-CM | POA: Diagnosis not present

## 2020-10-11 DIAGNOSIS — N186 End stage renal disease: Secondary | ICD-10-CM | POA: Diagnosis not present

## 2020-10-14 DIAGNOSIS — Z992 Dependence on renal dialysis: Secondary | ICD-10-CM | POA: Diagnosis not present

## 2020-10-14 DIAGNOSIS — N2581 Secondary hyperparathyroidism of renal origin: Secondary | ICD-10-CM | POA: Diagnosis not present

## 2020-10-14 DIAGNOSIS — N186 End stage renal disease: Secondary | ICD-10-CM | POA: Diagnosis not present

## 2020-10-15 ENCOUNTER — Other Ambulatory Visit: Payer: Self-pay

## 2020-10-15 NOTE — Patient Outreach (Signed)
[facility] [facility]) Care Management  10/15/2020  [place] [birth year] [id number]   Telephone call to patient for follow up. No answer.  HIPAA compliant voice message left.    Plan: RN CM will attempt again in the month of November.  [name], RN, MSN [place] Management Care Management Coordinator Direct Line [phone] Cell [phone] Toll Free: [phone]  Fax: [phone]

## 2020-10-16 DIAGNOSIS — Z992 Dependence on renal dialysis: Secondary | ICD-10-CM | POA: Diagnosis not present

## 2020-10-16 DIAGNOSIS — N186 End stage renal disease: Secondary | ICD-10-CM | POA: Diagnosis not present

## 2020-10-16 DIAGNOSIS — N2581 Secondary hyperparathyroidism of renal origin: Secondary | ICD-10-CM | POA: Diagnosis not present

## 2020-10-21 DIAGNOSIS — N186 End stage renal disease: Secondary | ICD-10-CM | POA: Diagnosis not present

## 2020-10-21 DIAGNOSIS — Z992 Dependence on renal dialysis: Secondary | ICD-10-CM | POA: Diagnosis not present

## 2020-10-21 DIAGNOSIS — N2581 Secondary hyperparathyroidism of renal origin: Secondary | ICD-10-CM | POA: Diagnosis not present

## 2020-10-23 DIAGNOSIS — N2581 Secondary hyperparathyroidism of renal origin: Secondary | ICD-10-CM | POA: Diagnosis not present

## 2020-10-23 DIAGNOSIS — N186 End stage renal disease: Secondary | ICD-10-CM | POA: Diagnosis not present

## 2020-10-23 DIAGNOSIS — Z992 Dependence on renal dialysis: Secondary | ICD-10-CM | POA: Diagnosis not present

## 2020-10-25 DIAGNOSIS — Z992 Dependence on renal dialysis: Secondary | ICD-10-CM | POA: Diagnosis not present

## 2020-10-25 DIAGNOSIS — N186 End stage renal disease: Secondary | ICD-10-CM | POA: Diagnosis not present

## 2020-10-25 DIAGNOSIS — N2581 Secondary hyperparathyroidism of renal origin: Secondary | ICD-10-CM | POA: Diagnosis not present

## 2020-10-28 DIAGNOSIS — Z992 Dependence on renal dialysis: Secondary | ICD-10-CM | POA: Diagnosis not present

## 2020-10-28 DIAGNOSIS — N2581 Secondary hyperparathyroidism of renal origin: Secondary | ICD-10-CM | POA: Diagnosis not present

## 2020-10-28 DIAGNOSIS — N186 End stage renal disease: Secondary | ICD-10-CM | POA: Diagnosis not present

## 2020-10-30 DIAGNOSIS — N186 End stage renal disease: Secondary | ICD-10-CM | POA: Diagnosis not present

## 2020-10-30 DIAGNOSIS — N2581 Secondary hyperparathyroidism of renal origin: Secondary | ICD-10-CM | POA: Diagnosis not present

## 2020-10-30 DIAGNOSIS — Z992 Dependence on renal dialysis: Secondary | ICD-10-CM | POA: Diagnosis not present

## 2020-11-01 DIAGNOSIS — N2581 Secondary hyperparathyroidism of renal origin: Secondary | ICD-10-CM | POA: Diagnosis not present

## 2020-11-01 DIAGNOSIS — Z992 Dependence on renal dialysis: Secondary | ICD-10-CM | POA: Diagnosis not present

## 2020-11-01 DIAGNOSIS — N186 End stage renal disease: Secondary | ICD-10-CM | POA: Diagnosis not present

## 2020-11-06 DIAGNOSIS — N2581 Secondary hyperparathyroidism of renal origin: Secondary | ICD-10-CM | POA: Diagnosis not present

## 2020-11-06 DIAGNOSIS — N186 End stage renal disease: Secondary | ICD-10-CM | POA: Diagnosis not present

## 2020-11-06 DIAGNOSIS — Z992 Dependence on renal dialysis: Secondary | ICD-10-CM | POA: Diagnosis not present

## 2020-11-08 DIAGNOSIS — N186 End stage renal disease: Secondary | ICD-10-CM | POA: Diagnosis not present

## 2020-11-08 DIAGNOSIS — Z992 Dependence on renal dialysis: Secondary | ICD-10-CM | POA: Diagnosis not present

## 2020-11-08 DIAGNOSIS — N2581 Secondary hyperparathyroidism of renal origin: Secondary | ICD-10-CM | POA: Diagnosis not present

## 2020-11-10 DIAGNOSIS — N186 End stage renal disease: Secondary | ICD-10-CM | POA: Diagnosis not present

## 2020-11-10 DIAGNOSIS — I129 Hypertensive chronic kidney disease with stage 1 through stage 4 chronic kidney disease, or unspecified chronic kidney disease: Secondary | ICD-10-CM | POA: Diagnosis not present

## 2020-11-10 DIAGNOSIS — Z992 Dependence on renal dialysis: Secondary | ICD-10-CM | POA: Diagnosis not present

## 2020-11-11 DIAGNOSIS — N2581 Secondary hyperparathyroidism of renal origin: Secondary | ICD-10-CM | POA: Diagnosis not present

## 2020-11-11 DIAGNOSIS — Z992 Dependence on renal dialysis: Secondary | ICD-10-CM | POA: Diagnosis not present

## 2020-11-11 DIAGNOSIS — N186 End stage renal disease: Secondary | ICD-10-CM | POA: Diagnosis not present

## 2020-11-18 DIAGNOSIS — N2581 Secondary hyperparathyroidism of renal origin: Secondary | ICD-10-CM | POA: Diagnosis not present

## 2020-11-18 DIAGNOSIS — N186 End stage renal disease: Secondary | ICD-10-CM | POA: Diagnosis not present

## 2020-11-18 DIAGNOSIS — Z992 Dependence on renal dialysis: Secondary | ICD-10-CM | POA: Diagnosis not present

## 2020-11-20 ENCOUNTER — Other Ambulatory Visit: Payer: Self-pay

## 2020-11-20 DIAGNOSIS — N186 End stage renal disease: Secondary | ICD-10-CM | POA: Diagnosis not present

## 2020-11-20 DIAGNOSIS — N2581 Secondary hyperparathyroidism of renal origin: Secondary | ICD-10-CM | POA: Diagnosis not present

## 2020-11-20 DIAGNOSIS — Z992 Dependence on renal dialysis: Secondary | ICD-10-CM | POA: Diagnosis not present

## 2020-11-20 NOTE — Patient Outreach (Signed)
[facility]) Care Management  11/20/2020  [place] August 04, 1960 [id number]   4th attempt telephone call to patient for follow up.  No answer.  HIPAA compliant voice message left.    Plan: RN CM will attempt patient again in the month of December.    [name], RN, MSN [place] Management Care Management Coordinator Direct Line [phone] Cell [phone] Toll Free: [phone]  Fax: [phone]

## 2020-11-25 DIAGNOSIS — N2581 Secondary hyperparathyroidism of renal origin: Secondary | ICD-10-CM | POA: Diagnosis not present

## 2020-11-25 DIAGNOSIS — Z992 Dependence on renal dialysis: Secondary | ICD-10-CM | POA: Diagnosis not present

## 2020-11-25 DIAGNOSIS — N186 End stage renal disease: Secondary | ICD-10-CM | POA: Diagnosis not present

## 2020-11-27 DIAGNOSIS — N186 End stage renal disease: Secondary | ICD-10-CM | POA: Diagnosis not present

## 2020-11-27 DIAGNOSIS — Z992 Dependence on renal dialysis: Secondary | ICD-10-CM | POA: Diagnosis not present

## 2020-11-27 DIAGNOSIS — N2581 Secondary hyperparathyroidism of renal origin: Secondary | ICD-10-CM | POA: Diagnosis not present

## 2020-12-02 DIAGNOSIS — N186 End stage renal disease: Secondary | ICD-10-CM | POA: Diagnosis not present

## 2020-12-02 DIAGNOSIS — N2581 Secondary hyperparathyroidism of renal origin: Secondary | ICD-10-CM | POA: Diagnosis not present

## 2020-12-02 DIAGNOSIS — Z992 Dependence on renal dialysis: Secondary | ICD-10-CM | POA: Diagnosis not present

## 2020-12-05 DIAGNOSIS — N2581 Secondary hyperparathyroidism of renal origin: Secondary | ICD-10-CM | POA: Diagnosis not present

## 2020-12-05 DIAGNOSIS — N186 End stage renal disease: Secondary | ICD-10-CM | POA: Diagnosis not present

## 2020-12-05 DIAGNOSIS — Z992 Dependence on renal dialysis: Secondary | ICD-10-CM | POA: Diagnosis not present

## 2020-12-09 DIAGNOSIS — N2581 Secondary hyperparathyroidism of renal origin: Secondary | ICD-10-CM | POA: Diagnosis not present

## 2020-12-09 DIAGNOSIS — N186 End stage renal disease: Secondary | ICD-10-CM | POA: Diagnosis not present

## 2020-12-09 DIAGNOSIS — Z992 Dependence on renal dialysis: Secondary | ICD-10-CM | POA: Diagnosis not present

## 2020-12-10 DIAGNOSIS — Z992 Dependence on renal dialysis: Secondary | ICD-10-CM | POA: Diagnosis not present

## 2020-12-10 DIAGNOSIS — I129 Hypertensive chronic kidney disease with stage 1 through stage 4 chronic kidney disease, or unspecified chronic kidney disease: Secondary | ICD-10-CM | POA: Diagnosis not present

## 2020-12-10 DIAGNOSIS — N186 End stage renal disease: Secondary | ICD-10-CM | POA: Diagnosis not present

## 2020-12-11 DIAGNOSIS — Z992 Dependence on renal dialysis: Secondary | ICD-10-CM | POA: Diagnosis not present

## 2020-12-11 DIAGNOSIS — N2581 Secondary hyperparathyroidism of renal origin: Secondary | ICD-10-CM | POA: Diagnosis not present

## 2020-12-11 DIAGNOSIS — N186 End stage renal disease: Secondary | ICD-10-CM | POA: Diagnosis not present

## 2020-12-13 DIAGNOSIS — N2581 Secondary hyperparathyroidism of renal origin: Secondary | ICD-10-CM | POA: Diagnosis not present

## 2020-12-13 DIAGNOSIS — N186 End stage renal disease: Secondary | ICD-10-CM | POA: Diagnosis not present

## 2020-12-13 DIAGNOSIS — Z992 Dependence on renal dialysis: Secondary | ICD-10-CM | POA: Diagnosis not present

## 2020-12-15 ENCOUNTER — Other Ambulatory Visit: Payer: Self-pay

## 2020-12-15 NOTE — Patient Outreach (Signed)
[place] [facility]) Care Management  12/15/2020  [place] [birth year] [id number]   Telephone call to patient for follow up.   No answer.  HIPAA compliant voice message left.    Plan: If no return call, RN CM will attempt patient again in January.  [name], RN, MSN [place] Management Care Management Coordinator Direct Line [phone] Cell [phone] Toll Free: [phone]  Fax: [phone]

## 2020-12-16 DIAGNOSIS — N2581 Secondary hyperparathyroidism of renal origin: Secondary | ICD-10-CM | POA: Diagnosis not present

## 2020-12-16 DIAGNOSIS — Z992 Dependence on renal dialysis: Secondary | ICD-10-CM | POA: Diagnosis not present

## 2020-12-16 DIAGNOSIS — N186 End stage renal disease: Secondary | ICD-10-CM | POA: Diagnosis not present

## 2020-12-19 DIAGNOSIS — N2581 Secondary hyperparathyroidism of renal origin: Secondary | ICD-10-CM | POA: Diagnosis not present

## 2020-12-19 DIAGNOSIS — Z992 Dependence on renal dialysis: Secondary | ICD-10-CM | POA: Diagnosis not present

## 2020-12-19 DIAGNOSIS — N186 End stage renal disease: Secondary | ICD-10-CM | POA: Diagnosis not present

## 2020-12-20 DIAGNOSIS — N2581 Secondary hyperparathyroidism of renal origin: Secondary | ICD-10-CM | POA: Diagnosis not present

## 2020-12-20 DIAGNOSIS — N186 End stage renal disease: Secondary | ICD-10-CM | POA: Diagnosis not present

## 2020-12-20 DIAGNOSIS — Z992 Dependence on renal dialysis: Secondary | ICD-10-CM | POA: Diagnosis not present

## 2020-12-24 DIAGNOSIS — N2581 Secondary hyperparathyroidism of renal origin: Secondary | ICD-10-CM | POA: Diagnosis not present

## 2020-12-24 DIAGNOSIS — Z992 Dependence on renal dialysis: Secondary | ICD-10-CM | POA: Diagnosis not present

## 2020-12-24 DIAGNOSIS — N186 End stage renal disease: Secondary | ICD-10-CM | POA: Diagnosis not present

## 2020-12-25 DIAGNOSIS — N2581 Secondary hyperparathyroidism of renal origin: Secondary | ICD-10-CM | POA: Diagnosis not present

## 2020-12-25 DIAGNOSIS — N186 End stage renal disease: Secondary | ICD-10-CM | POA: Diagnosis not present

## 2020-12-25 DIAGNOSIS — Z992 Dependence on renal dialysis: Secondary | ICD-10-CM | POA: Diagnosis not present

## 2020-12-29 DIAGNOSIS — N2581 Secondary hyperparathyroidism of renal origin: Secondary | ICD-10-CM | POA: Diagnosis not present

## 2020-12-29 DIAGNOSIS — N186 End stage renal disease: Secondary | ICD-10-CM | POA: Diagnosis not present

## 2020-12-29 DIAGNOSIS — Z992 Dependence on renal dialysis: Secondary | ICD-10-CM | POA: Diagnosis not present

## 2020-12-30 DIAGNOSIS — Z992 Dependence on renal dialysis: Secondary | ICD-10-CM | POA: Diagnosis not present

## 2020-12-30 DIAGNOSIS — N2581 Secondary hyperparathyroidism of renal origin: Secondary | ICD-10-CM | POA: Diagnosis not present

## 2020-12-30 DIAGNOSIS — N186 End stage renal disease: Secondary | ICD-10-CM | POA: Diagnosis not present

## 2021-01-02 DIAGNOSIS — N186 End stage renal disease: Secondary | ICD-10-CM | POA: Diagnosis not present

## 2021-01-02 DIAGNOSIS — N2581 Secondary hyperparathyroidism of renal origin: Secondary | ICD-10-CM | POA: Diagnosis not present

## 2021-01-02 DIAGNOSIS — Z992 Dependence on renal dialysis: Secondary | ICD-10-CM | POA: Diagnosis not present

## 2021-01-03 DIAGNOSIS — Z992 Dependence on renal dialysis: Secondary | ICD-10-CM | POA: Diagnosis not present

## 2021-01-03 DIAGNOSIS — N2581 Secondary hyperparathyroidism of renal origin: Secondary | ICD-10-CM | POA: Diagnosis not present

## 2021-01-03 DIAGNOSIS — N186 End stage renal disease: Secondary | ICD-10-CM | POA: Diagnosis not present

## 2021-01-07 DIAGNOSIS — N186 End stage renal disease: Secondary | ICD-10-CM | POA: Diagnosis not present

## 2021-01-07 DIAGNOSIS — Z992 Dependence on renal dialysis: Secondary | ICD-10-CM | POA: Diagnosis not present

## 2021-01-07 DIAGNOSIS — N2581 Secondary hyperparathyroidism of renal origin: Secondary | ICD-10-CM | POA: Diagnosis not present

## 2021-01-08 DIAGNOSIS — N186 End stage renal disease: Secondary | ICD-10-CM | POA: Diagnosis not present

## 2021-01-08 DIAGNOSIS — Z992 Dependence on renal dialysis: Secondary | ICD-10-CM | POA: Diagnosis not present

## 2021-01-08 DIAGNOSIS — N2581 Secondary hyperparathyroidism of renal origin: Secondary | ICD-10-CM | POA: Diagnosis not present

## 2021-01-10 DIAGNOSIS — N186 End stage renal disease: Secondary | ICD-10-CM | POA: Diagnosis not present

## 2021-01-10 DIAGNOSIS — Z992 Dependence on renal dialysis: Secondary | ICD-10-CM | POA: Diagnosis not present

## 2021-01-10 DIAGNOSIS — I129 Hypertensive chronic kidney disease with stage 1 through stage 4 chronic kidney disease, or unspecified chronic kidney disease: Secondary | ICD-10-CM | POA: Diagnosis not present

## 2021-01-15 DIAGNOSIS — Z992 Dependence on renal dialysis: Secondary | ICD-10-CM | POA: Diagnosis not present

## 2021-01-15 DIAGNOSIS — N186 End stage renal disease: Secondary | ICD-10-CM | POA: Diagnosis not present

## 2021-01-15 DIAGNOSIS — N2581 Secondary hyperparathyroidism of renal origin: Secondary | ICD-10-CM | POA: Diagnosis not present

## 2021-01-16 DIAGNOSIS — Z992 Dependence on renal dialysis: Secondary | ICD-10-CM | POA: Diagnosis not present

## 2021-01-16 DIAGNOSIS — N186 End stage renal disease: Secondary | ICD-10-CM | POA: Diagnosis not present

## 2021-01-16 DIAGNOSIS — N2581 Secondary hyperparathyroidism of renal origin: Secondary | ICD-10-CM | POA: Diagnosis not present

## 2021-01-17 DIAGNOSIS — Z992 Dependence on renal dialysis: Secondary | ICD-10-CM | POA: Diagnosis not present

## 2021-01-17 DIAGNOSIS — N2581 Secondary hyperparathyroidism of renal origin: Secondary | ICD-10-CM | POA: Diagnosis not present

## 2021-01-17 DIAGNOSIS — N186 End stage renal disease: Secondary | ICD-10-CM | POA: Diagnosis not present

## 2021-01-20 DIAGNOSIS — N186 End stage renal disease: Secondary | ICD-10-CM | POA: Diagnosis not present

## 2021-01-20 DIAGNOSIS — Z992 Dependence on renal dialysis: Secondary | ICD-10-CM | POA: Diagnosis not present

## 2021-01-20 DIAGNOSIS — N2581 Secondary hyperparathyroidism of renal origin: Secondary | ICD-10-CM | POA: Diagnosis not present

## 2021-01-23 DIAGNOSIS — Z992 Dependence on renal dialysis: Secondary | ICD-10-CM | POA: Diagnosis not present

## 2021-01-23 DIAGNOSIS — N2581 Secondary hyperparathyroidism of renal origin: Secondary | ICD-10-CM | POA: Diagnosis not present

## 2021-01-23 DIAGNOSIS — N186 End stage renal disease: Secondary | ICD-10-CM | POA: Diagnosis not present

## 2021-01-24 DIAGNOSIS — Z992 Dependence on renal dialysis: Secondary | ICD-10-CM | POA: Diagnosis not present

## 2021-01-24 DIAGNOSIS — N186 End stage renal disease: Secondary | ICD-10-CM | POA: Diagnosis not present

## 2021-01-24 DIAGNOSIS — N2581 Secondary hyperparathyroidism of renal origin: Secondary | ICD-10-CM | POA: Diagnosis not present

## 2021-01-27 DIAGNOSIS — Z992 Dependence on renal dialysis: Secondary | ICD-10-CM | POA: Diagnosis not present

## 2021-01-27 DIAGNOSIS — N186 End stage renal disease: Secondary | ICD-10-CM | POA: Diagnosis not present

## 2021-01-27 DIAGNOSIS — N2581 Secondary hyperparathyroidism of renal origin: Secondary | ICD-10-CM | POA: Diagnosis not present

## 2021-01-29 ENCOUNTER — Encounter (HOSPITAL_COMMUNITY): Payer: Self-pay

## 2021-01-29 ENCOUNTER — Emergency Department (HOSPITAL_COMMUNITY): Payer: Medicare HMO

## 2021-01-29 ENCOUNTER — Emergency Department (HOSPITAL_COMMUNITY)
Admission: EM | Admit: 2021-01-29 | Discharge: 2021-01-30 | Disposition: A | Discharge status: Home / Self Care | Payer: Medicare HMO | Attending: Emergency Medicine | Admitting: Emergency Medicine

## 2021-01-29 ENCOUNTER — Other Ambulatory Visit: Payer: Self-pay

## 2021-01-29 DIAGNOSIS — I5040 Unspecified combined systolic (congestive) and diastolic (congestive) heart failure: Secondary | ICD-10-CM | POA: Insufficient documentation

## 2021-01-29 DIAGNOSIS — I132 Hypertensive heart and chronic kidney disease with heart failure and with stage 5 chronic kidney disease, or end stage renal disease: Secondary | ICD-10-CM | POA: Insufficient documentation

## 2021-01-29 DIAGNOSIS — N186 End stage renal disease: Secondary | ICD-10-CM | POA: Insufficient documentation

## 2021-01-29 DIAGNOSIS — D631 Anemia in chronic kidney disease: Secondary | ICD-10-CM | POA: Diagnosis not present

## 2021-01-29 DIAGNOSIS — D649 Anemia, unspecified: Secondary | ICD-10-CM | POA: Diagnosis not present

## 2021-01-29 DIAGNOSIS — Y9389 Activity, other specified: Secondary | ICD-10-CM | POA: Insufficient documentation

## 2021-01-29 DIAGNOSIS — R9431 Abnormal electrocardiogram [ECG] [EKG]: Secondary | ICD-10-CM | POA: Diagnosis not present

## 2021-01-29 DIAGNOSIS — Z79899 Other long term (current) drug therapy: Secondary | ICD-10-CM | POA: Diagnosis not present

## 2021-01-29 DIAGNOSIS — Z992 Dependence on renal dialysis: Secondary | ICD-10-CM | POA: Diagnosis not present

## 2021-01-29 DIAGNOSIS — M7989 Other specified soft tissue disorders: Secondary | ICD-10-CM | POA: Diagnosis not present

## 2021-01-29 DIAGNOSIS — M25522 Pain in left elbow: Secondary | ICD-10-CM | POA: Diagnosis present

## 2021-01-29 DIAGNOSIS — N189 Chronic kidney disease, unspecified: Secondary | ICD-10-CM

## 2021-01-29 DIAGNOSIS — I700 Atherosclerosis of aorta: Secondary | ICD-10-CM | POA: Diagnosis not present

## 2021-01-29 DIAGNOSIS — E875 Hyperkalemia: Secondary | ICD-10-CM | POA: Insufficient documentation

## 2021-01-29 DIAGNOSIS — M7022 Olecranon bursitis, left elbow: Secondary | ICD-10-CM | POA: Insufficient documentation

## 2021-01-29 DIAGNOSIS — I120 Hypertensive chronic kidney disease with stage 5 chronic kidney disease or end stage renal disease: Secondary | ICD-10-CM | POA: Diagnosis not present

## 2021-01-29 DIAGNOSIS — R0989 Other specified symptoms and signs involving the circulatory and respiratory systems: Secondary | ICD-10-CM | POA: Diagnosis not present

## 2021-01-29 LAB — CBC WITH DIFFERENTIAL/PLATELET
Abs Immature Granulocytes: 0.02 10*3/uL (ref 0.00–0.07)
Basophils Absolute: 0 10*3/uL (ref 0.0–0.1)
Basophils Relative: 1 %
Eosinophils Absolute: 0.5 10*3/uL (ref 0.0–0.5)
Eosinophils Relative: 8 %
HCT: 32.2 % — ABNORMAL LOW (ref 39.0–52.0)
Hemoglobin: 10.8 g/dL — ABNORMAL LOW (ref 13.0–17.0)
Immature Granulocytes: 0 %
Lymphocytes Relative: 9 %
Lymphs Abs: 0.5 10*3/uL — ABNORMAL LOW (ref 0.7–4.0)
MCH: 32.1 pg (ref 26.0–34.0)
MCHC: 33.5 g/dL (ref 30.0–36.0)
MCV: 95.8 fL (ref 80.0–100.0)
Monocytes Absolute: 0.6 10*3/uL (ref 0.1–1.0)
Monocytes Relative: 9 %
Neutro Abs: 4.6 10*3/uL (ref 1.7–7.7)
Neutrophils Relative %: 73 %
Platelets: 218 10*3/uL (ref 150–400)
RBC: 3.36 MIL/uL — ABNORMAL LOW (ref 4.22–5.81)
RDW: 14.6 % (ref 11.5–15.5)
WBC: 6.2 10*3/uL (ref 4.0–10.5)
nRBC: 0 % (ref 0.0–0.2)

## 2021-01-29 LAB — BASIC METABOLIC PANEL
Anion gap: 18 — ABNORMAL HIGH (ref 5–15)
BUN: 80 mg/dL — ABNORMAL HIGH (ref 6–20)
CO2: 25 mmol/L (ref 22–32)
Calcium: 9 mg/dL (ref 8.9–10.3)
Chloride: 96 mmol/L — ABNORMAL LOW (ref 98–111)
Creatinine, Ser: 10.41 mg/dL — ABNORMAL HIGH (ref 0.61–1.24)
GFR, Estimated: 5 mL/min — ABNORMAL LOW (ref >60)
Glucose, Bld: 156 mg/dL — ABNORMAL HIGH (ref 70–99)
Potassium: 5.8 mmol/L — ABNORMAL HIGH (ref 3.5–5.1)
Sodium: 139 mmol/L (ref 135–145)

## 2021-01-29 IMAGING — DX DG ELBOW COMPLETE 3+V*L*
4 series · 4 of 4 positions shown · non-contrast
Comparison: None.

CLINICAL DATA: Posterior elbow swelling, initial encounter

EXAM:
LEFT ELBOW - COMPLETE 3+ VIEW

[elbow ap]
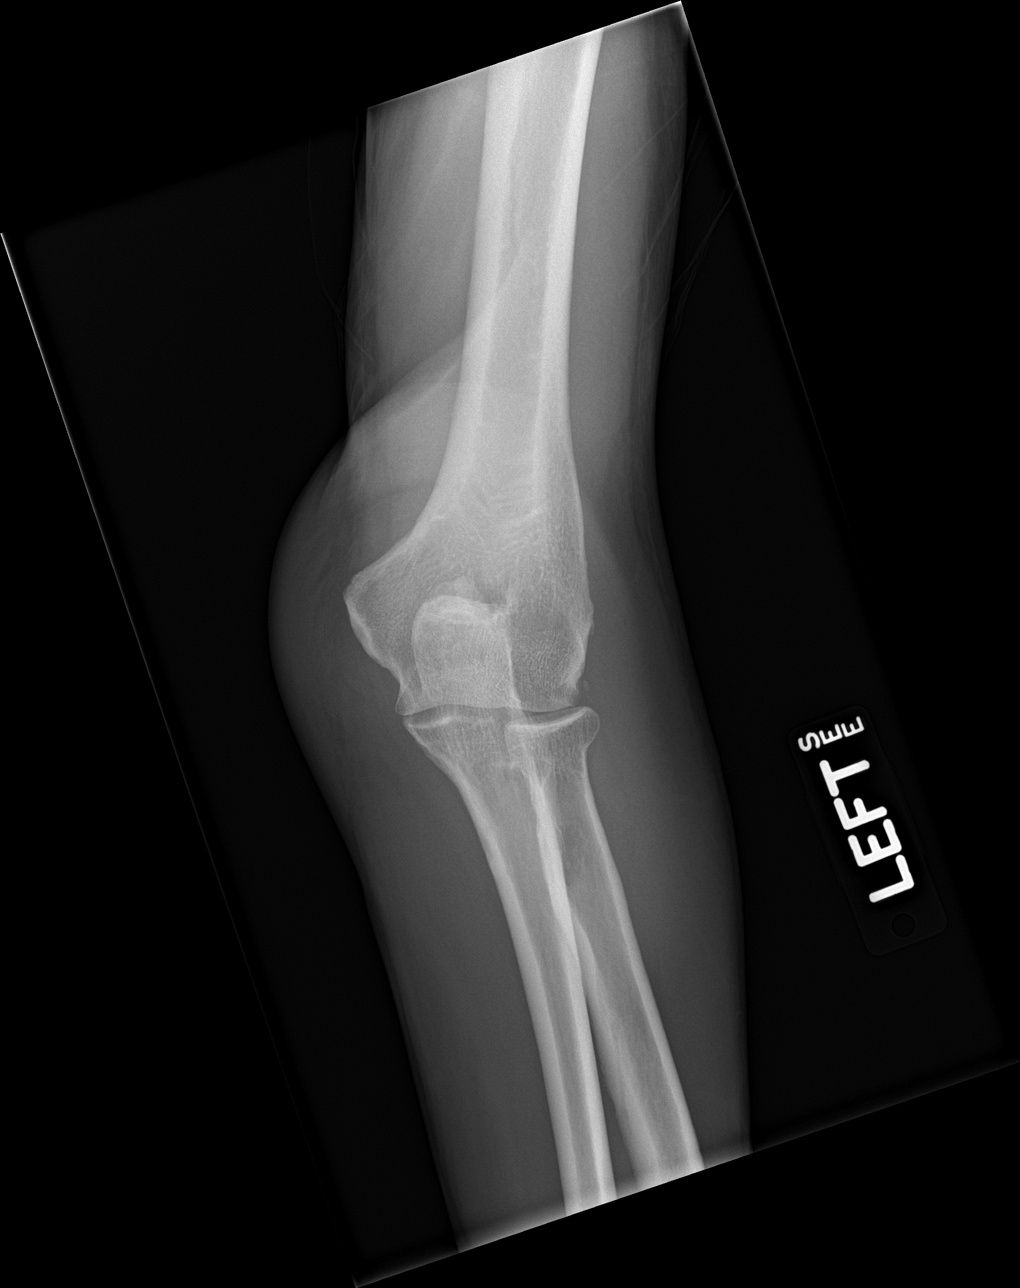

[elbow obl (1 of 2)]
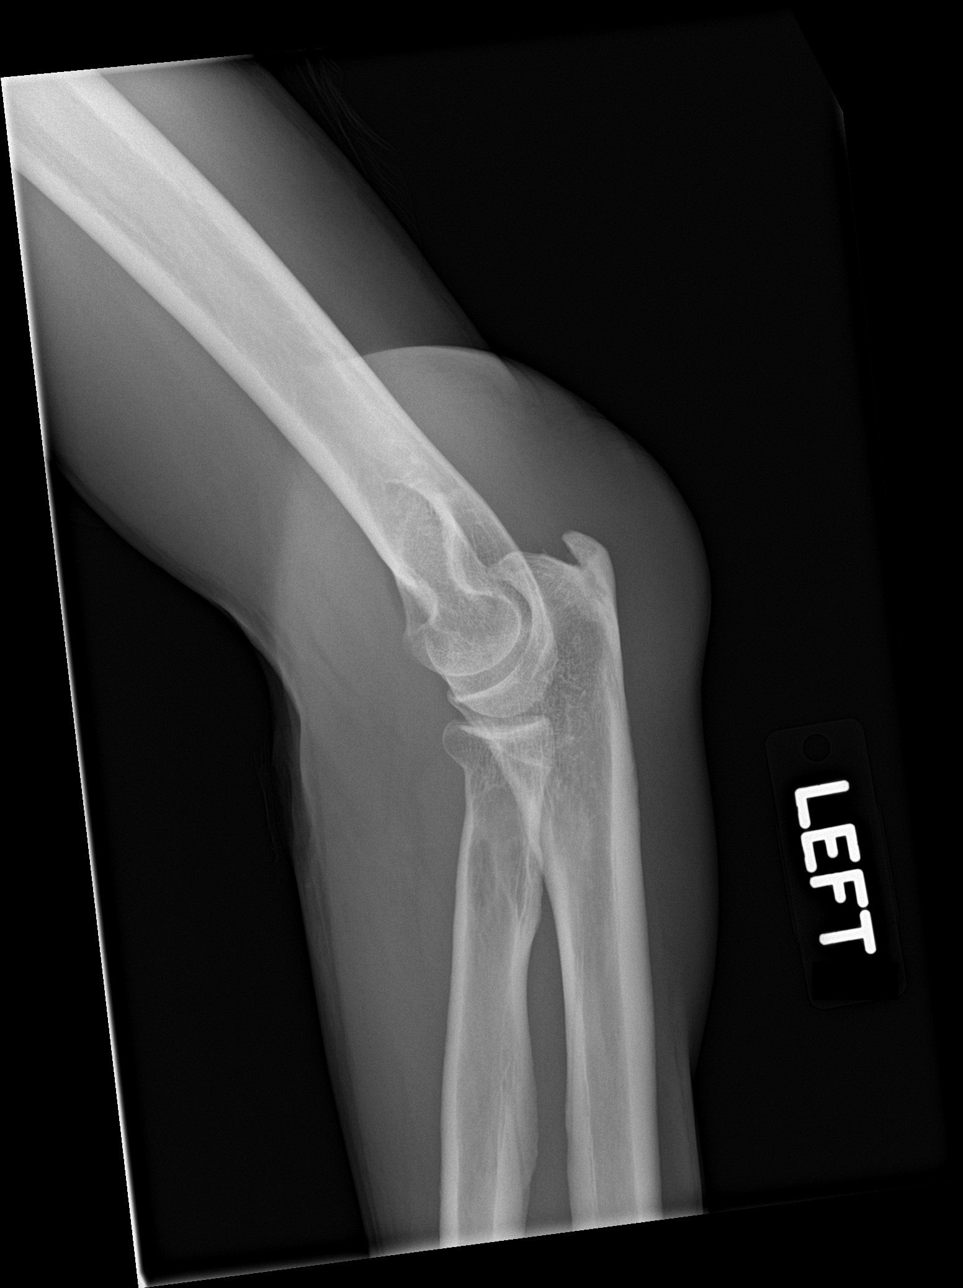

[elbow obl (2 of 2)]
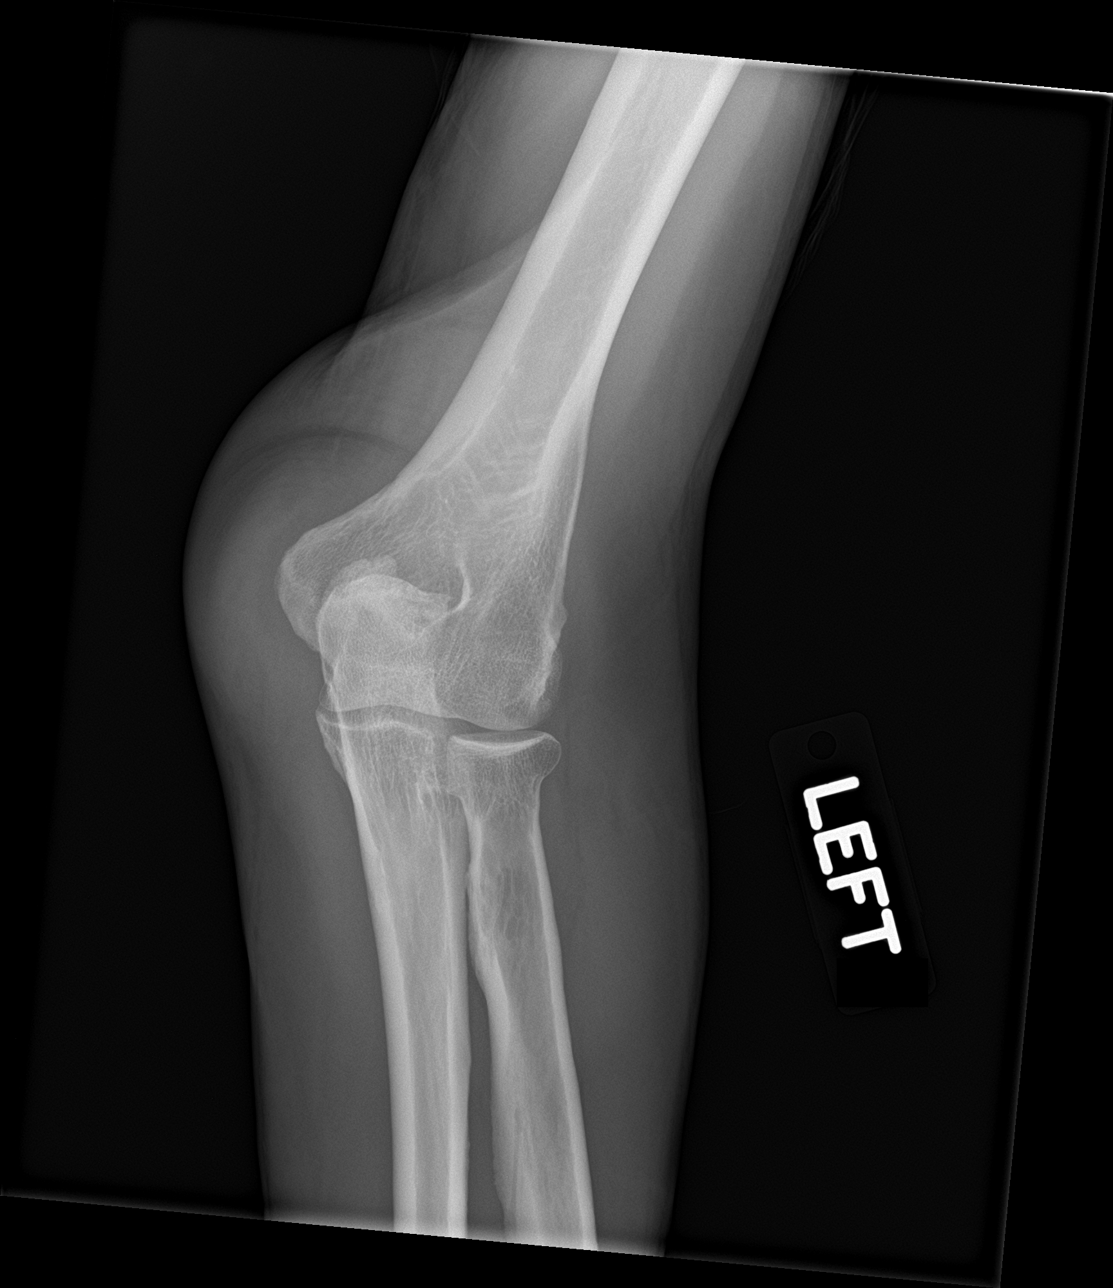

[elbow lat]
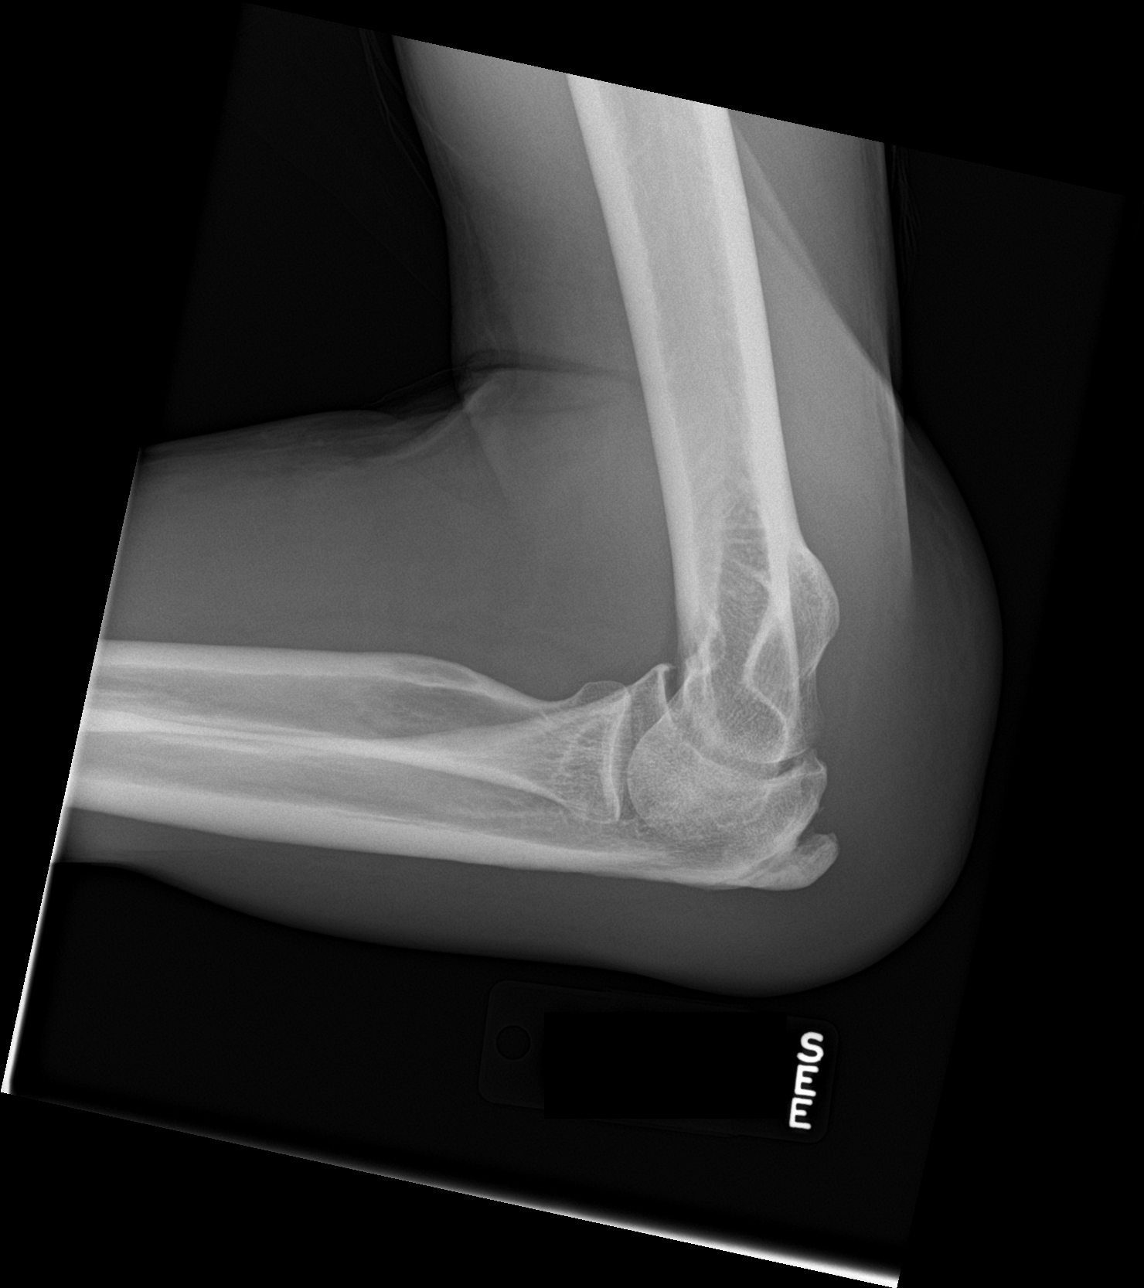

[4 of 4 positions shown; findings below may reference images not displayed]

FINDINGS: Olecranon spurring is noted. Considerable soft tissue swelling over
the olecranon is seen consistent with olecranon bursitis. No foreign
body is noted. Mild degenerative changes of the elbow joint are
seen. No joint effusion is noted. No fracture is seen.
IMPRESSION: Changes of olecranon bursitis.

Mild degenerative changes of the elbow joint are seen. No acute bony
abnormality is noted.

## 2021-01-29 NOTE — ED Provider Triage Note (Signed)
Emergency Medicine Provider Triage Evaluation Note  Jason [name]. , a 61 y.o. male  was evaluated in triage.  Pt complains of left elbow swelling.  States been ongoing for about a week now.  Steadily worsening.  He has not had any fever or noted redness to the joint.  He does report this is happened before.  He denies any recent trauma.  He is a dialysis patient, missed his session today.  Cannot tell me exactly why.  He is starting to feel little short of breath.  He denies any chest pain.  Review of Systems  Positive: Elbow swelling, SOB Negative: fever  Physical Exam  BP (!) 151/85    Pulse 72    Temp 98 F (36.7 C) (Oral)    Resp 20    Ht 6\' 2"  (1.88 m)    Wt 77.1 kg    SpO2 91%    BMI 21.83 kg/m  Gen:   Awake, no distress   Resp:  Normal effort  MSK:   Moves extremities without difficulty  Other:  Large amount of swelling over the left olecranon is able to flex and extend, there is no overlying erythema or induration  Medical Decision Making  Medically screening exam initiated at 10:09 PM.  Appropriate orders placed.  [name] was informed that the remainder of the evaluation will be completed by another provider, this initial triage assessment does not replace that evaluation, and the importance of remaining in the ED until their evaluation is complete.  Left elbow swelling, shortness of breath.  May be olecranon bursitis.  Will check labs since he missed HD today.  Elbow films and CXR.   Larene Pickett, PA-C 01/29/21 2240

## 2021-01-29 NOTE — ED Triage Notes (Addendum)
Pt presents to the ED from home with complaints of left elbow swelling and pain. Onset last week, denies injury, worse today. Pt missed dialysis today, normally tues/th/sat.pt states he has 10 lbs extra fluid on. Denies Sob while sitting, worse with exertion.

## 2021-01-30 DIAGNOSIS — Z992 Dependence on renal dialysis: Secondary | ICD-10-CM | POA: Diagnosis not present

## 2021-01-30 DIAGNOSIS — N2581 Secondary hyperparathyroidism of renal origin: Secondary | ICD-10-CM | POA: Diagnosis not present

## 2021-01-30 DIAGNOSIS — N186 End stage renal disease: Secondary | ICD-10-CM | POA: Diagnosis not present

## 2021-01-30 DIAGNOSIS — M7022 Olecranon bursitis, left elbow: Secondary | ICD-10-CM | POA: Diagnosis not present

## 2021-01-30 LAB — SYNOVIAL CELL COUNT + DIFF, W/ CRYSTALS
Crystals, Fluid: NONE SEEN
Eosinophils-Synovial: 1 % (ref 0–1)
Lymphocytes-Synovial Fld: 2 % (ref 0–20)
Monocyte-Macrophage-Synovial Fluid: 2 % — ABNORMAL LOW (ref 50–90)
Neutrophil, Synovial: 95 % — ABNORMAL HIGH (ref 0–25)
WBC, Synovial: 7208 /mm3 — ABNORMAL HIGH (ref 0–200)

## 2021-01-30 MED ORDER — LIDOCAINE-EPINEPHRINE (PF) 2 %-1:200000 IJ SOLN
10.0000 mL | Freq: Once | INTRAMUSCULAR | Status: AC
  Administered 2021-01-30: 10 mL
  Filled 2021-01-30: qty 20

## 2021-01-30 NOTE — ED Provider Notes (Signed)
[facility] EMERGENCY DEPARTMENT Provider Note   CSN: [id number] Arrival date & time: 01/29/21  2144     History  Chief Complaint  Patient presents with   Joint Swelling    Jason [name]. is a 61 y.o. male.  The history is provided by the patient.  He has history of hypertension, combined systolic and diastolic heart failure, end-stage renal disease on hemodialysis and comes in because of painful swelling in his left elbow for the last week.  He denies any trauma.  Pain is worse with movement.  He did not go to his dialysis yesterday because of his elbow pain.  He denies fever or chills.   Home Medications Prior to Admission medications   Medication Sig Start Date End Date Taking? Authorizing Provider  amLODipine (NORVASC) 10 MG tablet Take 10 mg by mouth every morning. 06/20/18  Yes [provider]  carvedilol (COREG) 25 MG tablet Take 1 tablet (25 mg total) by mouth 2 (two) times daily with a meal. 02/02/18  Yes [name], FNP  hydrALAZINE (APRESOLINE) 100 MG tablet Take 1 tablet (100 mg total) by mouth 3 (three) times daily. Patient taking differently: Take 100 mg by mouth 2 (two) times daily. 11/03/19 01/30/21 Yes Pahwani, Einar Grad, MD  multivitamin (RENA-VIT) TABS tablet Take 1 tablet by mouth every morning. 10/20/17  Yes [provider]  polyvinyl alcohol (LIQUIFILM TEARS) 1.4 % ophthalmic solution Place 1 drop into both eyes daily as needed for dry eyes.   Yes [provider]  doxercalciferol (HECTOROL) 0.5 MCG capsule At dialysis 02/21/20 02/19/21  [provider]  iron sucrose in sodium chloride 0.9 % 100 mL Iron Sucrose (Venofer) 02/26/20 02/17/21  [provider]  methocarbamol (ROBAXIN) 500 MG tablet Take 1 tablet (500 mg total) by mouth every 8 (eight) hours as needed for muscle spasms. Patient not taking: Reported on 01/30/2021 07/22/20   Caren Griffins, MD  Methoxy PEG-Epoetin Beta (MIRCERA IJ) Mircera 02/12/20  02/10/21  [provider]      Allergies    Lisinopril    Review of Systems   Review of Systems  All other systems reviewed and are negative.  Physical Exam Updated Vital Signs BP 136/79    Pulse 67    Temp 97.7 F (36.5 C) (Oral)    Resp 17    Ht 6\' 2"  (1.88 m)    Wt 77.1 kg    SpO2 91%    BMI 21.83 kg/m  Physical Exam Vitals and nursing note reviewed.  61 year old male, resting comfortably and in no acute distress. Vital signs are normal. Oxygen saturation is 91%, which is normal. Head is normocephalic and atraumatic. PERRLA, EOMI. Oropharynx is clear. Neck is nontender and supple without adenopathy or JVD. Back is nontender and there is no CVA tenderness. Lungs are clear without rales, wheezes, or rhonchi. Chest is nontender. Heart has regular rate and rhythm without murmur. Abdomen is soft, flat, nontender without masses or hepatosplenomegaly and peristalsis is normoactive. Extremities: There is moderate swelling of the left olecranon bursa without any overlying erythema or warmth.  Full range of motion is present in the left elbow without pain.  AV fistula is present in the right upper arm with thrill present.  There is no peripheral edema. Skin is warm and dry without rash. Neurologic: Mental status is normal, cranial nerves are intact, moves all extremities equally.  ED Results / Procedures / Treatments   Labs (all labs  ordered are listed, but only abnormal results are displayed) Labs Reviewed  CBC WITH DIFFERENTIAL/PLATELET - Abnormal; Notable for the following components:      Result Value   RBC 3.36 (*)    Hemoglobin 10.8 (*)    HCT 32.2 (*)    Lymphs Abs 0.5 (*)    All other components within normal limits  BASIC METABOLIC PANEL - Abnormal; Notable for the following components:   Potassium 5.8 (*)    Chloride 96 (*)    Glucose, Bld 156 (*)    BUN 80 (*)    Creatinine, Ser 10.41 (*)    GFR, Estimated 5 (*)    Anion gap 18 (*)    All other components  within normal limits    EKG EKG Interpretation  Date/Time:  Thursday January 29 2021 22:08:46 EST Ventricular Rate:  62 PR Interval:  138 QRS Duration: 110 QT Interval:  426 QTC Calculation: 432 R Axis:   255 Text Interpretation: Normal sinus rhythm Right superior axis deviation Pulmonary disease pattern Minimal voltage criteria for LVH, may be normal variant ( Cornell product ) ST & T wave abnormality, consider inferolateral ischemia Abnormal ECG When compared with ECG of 31-Aug-2020 04:27, T wave inversion is now present Inferior leads , anterlateral leads Confirmed by Delora Fuel ([id number]) on 01/30/2021 5:32:00 AM  Radiology DG Chest 2 View  Result Date: 01/29/2021 CLINICAL DATA:  Peripheral swelling EXAM: CHEST - 2 VIEW COMPARISON:  08/31/2020 FINDINGS: Cardiac shadow is prominent. Aortic calcifications are seen. Mild vascular congestion is noted likely related to the recently missed dialysis session. No sizable infiltrate or effusion is noted. No bony abnormality is seen. IMPRESSION: Mild vascular congestion likely related to fluid overload. Electronically Signed   By: Inez Catalina M.D.   On: 01/29/2021 23:14   DG Elbow Complete Left  Result Date: 01/29/2021 CLINICAL DATA:  Posterior elbow swelling, initial encounter EXAM: LEFT ELBOW - COMPLETE 3+ VIEW COMPARISON:  None. FINDINGS: Olecranon spurring is noted. Considerable soft tissue swelling over the olecranon is seen consistent with olecranon bursitis. No foreign body is noted. Mild degenerative changes of the elbow joint are seen. No joint effusion is noted. No fracture is seen. IMPRESSION: Changes of olecranon bursitis. Mild degenerative changes of the elbow joint are seen. No acute bony abnormality is noted. Electronically Signed   By: Inez Catalina M.D.   On: 01/29/2021 23:12    Procedures Procedures    Medications Ordered in ED Medications  lidocaine-EPINEPHrine (XYLOCAINE W/EPI) 2 %-1:200000 (PF) injection 10 mL (has no  administration in time range)    ED Course/ Medical Decision Making/ A&P                           Medical Decision Making Risk Prescription drug management.   Left olecranon bursitis.  No clinical evidence to suggest abscess, but will aspirate to see if the fluid is purulent.  End-stage renal disease on hemodialysis.  Screening labs showed anemia which is improved over baseline, and hyperkalemia but not to a dangerous level.  ECG shows T wave inversions in the inferior and anterolateral leads but no tall, peaked T waves or QRS widening to suggest effects of hyperkalemia.  Because of hyperkalemia, I do feel he will need to be dialyzed today.  Carlisle Cater PA-C has done aspiration of the olecranon bursa.  Fluid does not appear overtly purulent, but will wait for cell count and gram stain.  In the meantime,  I have discussed the case with Dr. Joylene Grapes of nephrology service who has made arrangements for him to get his dialysis at his usual dialysis center at noon today.        Final Clinical Impression(s) / ED Diagnoses Final diagnoses:  Olecranon bursitis, left elbow  End-stage renal disease on hemodialysis ([place])  Anemia associated with chronic renal failure  Hyperkalemia    Rx / DC Orders ED Discharge Orders     None         Delora Fuel, MD 96/78/93 [phone]

## 2021-01-30 NOTE — ED Notes (Signed)
Pt provided bag lunch.  Pt stated he has dialysis at 12pm. Notified EDP about tx plan for pt.

## 2021-01-30 NOTE — ED Notes (Signed)
Placed pt on room air O2 at 98%

## 2021-01-30 NOTE — ED Notes (Signed)
Patient is resting comfortably. 

## 2021-01-30 NOTE — Discharge Instructions (Addendum)
Go to your dialysis center at noon today to get your dialysis.  It is very important that you not miss this dialysis session.  If the fluid from your elbow appears to be infected, we will call in an antibiotic for you.

## 2021-01-30 NOTE — ED Notes (Signed)
Oxygen down to 84% RA. Explained to patient the importance of keeping oxygen in his nose and his heart monitor on. Patient verbalized understanding

## 2021-01-30 NOTE — ED Notes (Signed)
Pt removed BP cuff d/t tightness. Pt wanted to remove pulse ox but encourage pt to keep on to monitor O2. Pt stated his O2 usually drop in the high 80's and he was told just to take deep breaths when this happen. Pt stated he is not normally on O2.

## 2021-01-30 NOTE — ED Provider Notes (Addendum)
.  Joint Aspiration/Arthrocentesis  Date/Time: 01/30/2021 6:58 AM Performed by: Carlisle Cater, PA-C Authorized by: Carlisle Cater, PA-C   Consent:    Consent obtained:  Verbal   Consent given by:  Patient   Risks discussed:  Bleeding and pain   Alternatives discussed:  No treatment Universal protocol:    Patient identity confirmed:  Verbally with patient, arm band and provided demographic data Location:    Location:  Elbow   Elbow:  L elbow (olecranon bursa) Anesthesia:    Anesthesia method:  Local infiltration   Local anesthetic:  Lidocaine 2% WITH epi Procedure details:    Needle gauge:  18 G   Ultrasound guidance: no     Aspirate amount:  8cc   Aspirate characteristics:  Blood-tinged, yellow and cloudy (non-purulent appearing)   Steroid injected: no     Specimen collected: yes   Post-procedure details:    Dressing:  Adhesive bandage   Procedure completion:  Tolerated well, no immediate complications    Carlisle Cater, PA-C 01/30/21 0659  11:18 AM Patient placed up for discharge at 1030 without results from bursa aspirate so that he could make his dialysis session at 1200.   Cell count reviewed at 1115. WBC in 7000's. This is consistent with inflammatory response and do not feel patient requires antibiotics at this time.      Delora Fuel, MD 02/54/27 0744    Carlisle Cater, PA-C 07/04/74 2831    Delora Fuel, MD 51/76/16 2245

## 2021-01-30 NOTE — ED Notes (Signed)
Notified EDP for pt to eat.

## 2021-01-31 DIAGNOSIS — Z992 Dependence on renal dialysis: Secondary | ICD-10-CM | POA: Diagnosis not present

## 2021-01-31 DIAGNOSIS — N186 End stage renal disease: Secondary | ICD-10-CM | POA: Diagnosis not present

## 2021-01-31 DIAGNOSIS — N2581 Secondary hyperparathyroidism of renal origin: Secondary | ICD-10-CM | POA: Diagnosis not present

## 2021-02-02 ENCOUNTER — Other Ambulatory Visit: Payer: Self-pay

## 2021-02-02 ENCOUNTER — Ambulatory Visit: Payer: Self-pay

## 2021-02-02 ENCOUNTER — Telehealth (HOSPITAL_BASED_OUTPATIENT_CLINIC_OR_DEPARTMENT_OTHER): Payer: Self-pay | Admitting: *Deleted

## 2021-02-02 LAB — BODY FLUID CULTURE W GRAM STAIN

## 2021-02-02 NOTE — Patient Outreach (Signed)
[facility]) Care Management  02/02/2021  Edna Grover. 1960/08/20 [id number]   Telephone Assessment    Unsuccessful outreach attempt to patient. No answer after several rings.    Plan: RN CM will make outreach attempt within 4 business days.  Enzo Montgomery, RN,BSN,CCM [place] Management Telephonic Care Management Coordinator Direct Phone: [phone] Toll Free: [phone] Fax: [phone]

## 2021-02-03 ENCOUNTER — Other Ambulatory Visit: Payer: Self-pay

## 2021-02-03 ENCOUNTER — Telehealth: Payer: Self-pay

## 2021-02-03 ENCOUNTER — Ambulatory Visit: Payer: Self-pay

## 2021-02-03 NOTE — Patient Outreach (Signed)
[facility]) Care Management  02/03/2021  Sohaib Vereen. 1960/12/10 [id number]   Telephone Assessment    Unsuccessful outreach attempt to patient.      Plan:  Assigned RN CM will make outreach attempt to patient within the month of Feb.   Ainslee Sou Verl [place] Management Telephonic Care Management Coordinator Direct Phone: [phone] Toll Free: [phone] Fax: [phone]

## 2021-02-03 NOTE — Telephone Encounter (Signed)
Post ED Visit - Positive Culture Follow-up: Successful Patient Follow-Up  Culture assessed and recommendations reviewed by:  []  Elenor Quinones, Pharm.D. [x]  Heide Guile, Pharm.D., BCPS AQ-ID []  Parks Neptune, Pharm.D., BCPS []  Alycia Rossetti, Pharm.D., BCPS []  Connell, Pharm.D., BCPS, AAHIVP []  Legrand Como, Pharm.D., BCPS, AAHIVP []  Salome Arnt, PharmD, BCPS []  Johnnette Gourd, PharmD, BCPS []  Hughes Better, PharmD, BCPS []  Leeroy Cha, PharmD  Positive body fluid culture  [x]  Patient discharged without antimicrobial prescription and treatment is now indicated []  Organism is resistant to prescribed ED discharge antimicrobial []  Patient with positive blood cultures  Changes discussed with ED provider: Wyn Quaker PA-C ED provider recommends pt return to the ED for evaluation, concern that elbow may be infected and may need treatment with antibiotics.   Contacted patient, date 02/03/2021, time 3:23 pm   Glennon Hamilton 02/03/2021, 3:24 PM

## 2021-02-05 DIAGNOSIS — N2581 Secondary hyperparathyroidism of renal origin: Secondary | ICD-10-CM | POA: Diagnosis not present

## 2021-02-05 DIAGNOSIS — N186 End stage renal disease: Secondary | ICD-10-CM | POA: Diagnosis not present

## 2021-02-05 DIAGNOSIS — Z992 Dependence on renal dialysis: Secondary | ICD-10-CM | POA: Diagnosis not present

## 2021-02-08 ENCOUNTER — Emergency Department (HOSPITAL_COMMUNITY): Payer: Medicare HMO

## 2021-02-08 ENCOUNTER — Other Ambulatory Visit: Payer: Self-pay

## 2021-02-08 ENCOUNTER — Observation Stay (HOSPITAL_COMMUNITY)
Admission: EM | Admit: 2021-02-08 | Discharge: 2021-02-09 | Disposition: A | Discharge status: Home / Self Care | Payer: Medicare HMO | Attending: Internal Medicine | Admitting: Internal Medicine

## 2021-02-08 ENCOUNTER — Encounter (HOSPITAL_COMMUNITY): Payer: Self-pay | Admitting: *Deleted

## 2021-02-08 DIAGNOSIS — I10 Essential (primary) hypertension: Secondary | ICD-10-CM | POA: Diagnosis not present

## 2021-02-08 DIAGNOSIS — Z87891 Personal history of nicotine dependence: Secondary | ICD-10-CM | POA: Diagnosis not present

## 2021-02-08 DIAGNOSIS — M71122 Other infective bursitis, left elbow: Secondary | ICD-10-CM | POA: Diagnosis present

## 2021-02-08 DIAGNOSIS — Y9389 Activity, other specified: Secondary | ICD-10-CM | POA: Insufficient documentation

## 2021-02-08 DIAGNOSIS — N186 End stage renal disease: Secondary | ICD-10-CM

## 2021-02-08 DIAGNOSIS — Z992 Dependence on renal dialysis: Secondary | ICD-10-CM | POA: Diagnosis not present

## 2021-02-08 DIAGNOSIS — Z79899 Other long term (current) drug therapy: Secondary | ICD-10-CM | POA: Insufficient documentation

## 2021-02-08 DIAGNOSIS — M545 Low back pain, unspecified: Secondary | ICD-10-CM | POA: Diagnosis present

## 2021-02-08 DIAGNOSIS — Z20822 Contact with and (suspected) exposure to covid-19: Secondary | ICD-10-CM | POA: Diagnosis not present

## 2021-02-08 DIAGNOSIS — I517 Cardiomegaly: Secondary | ICD-10-CM | POA: Diagnosis not present

## 2021-02-08 DIAGNOSIS — R9431 Abnormal electrocardiogram [ECG] [EKG]: Secondary | ICD-10-CM | POA: Diagnosis not present

## 2021-02-08 DIAGNOSIS — I5033 Acute on chronic diastolic (congestive) heart failure: Secondary | ICD-10-CM | POA: Diagnosis not present

## 2021-02-08 DIAGNOSIS — I132 Hypertensive heart and chronic kidney disease with heart failure and with stage 5 chronic kidney disease, or end stage renal disease: Secondary | ICD-10-CM | POA: Diagnosis not present

## 2021-02-08 DIAGNOSIS — M7022 Olecranon bursitis, left elbow: Secondary | ICD-10-CM | POA: Insufficient documentation

## 2021-02-08 DIAGNOSIS — J9811 Atelectasis: Secondary | ICD-10-CM | POA: Diagnosis not present

## 2021-02-08 DIAGNOSIS — I160 Hypertensive urgency: Secondary | ICD-10-CM | POA: Diagnosis present

## 2021-02-08 DIAGNOSIS — R0902 Hypoxemia: Secondary | ICD-10-CM | POA: Diagnosis present

## 2021-02-08 DIAGNOSIS — M62830 Muscle spasm of back: Secondary | ICD-10-CM

## 2021-02-08 DIAGNOSIS — E875 Hyperkalemia: Secondary | ICD-10-CM | POA: Diagnosis not present

## 2021-02-08 DIAGNOSIS — K834 Spasm of sphincter of Oddi: Secondary | ICD-10-CM | POA: Diagnosis not present

## 2021-02-08 DIAGNOSIS — M549 Dorsalgia, unspecified: Principal | ICD-10-CM | POA: Insufficient documentation

## 2021-02-08 LAB — CBC WITH DIFFERENTIAL/PLATELET
Abs Immature Granulocytes: 0.02 10*3/uL (ref 0.00–0.07)
Basophils Absolute: 0 10*3/uL (ref 0.0–0.1)
Basophils Relative: 1 %
Eosinophils Absolute: 0.3 10*3/uL (ref 0.0–0.5)
Eosinophils Relative: 5 %
HCT: 32.1 % — ABNORMAL LOW (ref 39.0–52.0)
Hemoglobin: 10.4 g/dL — ABNORMAL LOW (ref 13.0–17.0)
Immature Granulocytes: 0 %
Lymphocytes Relative: 10 %
Lymphs Abs: 0.5 10*3/uL — ABNORMAL LOW (ref 0.7–4.0)
MCH: 30.7 pg (ref 26.0–34.0)
MCHC: 32.4 g/dL (ref 30.0–36.0)
MCV: 94.7 fL (ref 80.0–100.0)
Monocytes Absolute: 0.5 10*3/uL (ref 0.1–1.0)
Monocytes Relative: 9 %
Neutro Abs: 3.8 10*3/uL (ref 1.7–7.7)
Neutrophils Relative %: 75 %
Platelets: 202 10*3/uL (ref 150–400)
RBC: 3.39 MIL/uL — ABNORMAL LOW (ref 4.22–5.81)
RDW: 13.8 % (ref 11.5–15.5)
WBC: 5.1 10*3/uL (ref 4.0–10.5)
nRBC: 0 % (ref 0.0–0.2)

## 2021-02-08 LAB — COMPREHENSIVE METABOLIC PANEL
ALT: 21 U/L (ref 0–44)
AST: 14 U/L — ABNORMAL LOW (ref 15–41)
Albumin: 3.3 g/dL — ABNORMAL LOW (ref 3.5–5.0)
Alkaline Phosphatase: 41 U/L (ref 38–126)
Anion gap: 14 (ref 5–15)
BUN: 112 mg/dL — ABNORMAL HIGH (ref 6–20)
CO2: 24 mmol/L (ref 22–32)
Calcium: 9 mg/dL (ref 8.9–10.3)
Chloride: 96 mmol/L — ABNORMAL LOW (ref 98–111)
Creatinine, Ser: 13.99 mg/dL — ABNORMAL HIGH (ref 0.61–1.24)
GFR, Estimated: 4 mL/min — ABNORMAL LOW (ref >60)
Glucose, Bld: 112 mg/dL — ABNORMAL HIGH (ref 70–99)
Potassium: 5.8 mmol/L — ABNORMAL HIGH (ref 3.5–5.1)
Sodium: 134 mmol/L — ABNORMAL LOW (ref 135–145)
Total Bilirubin: 0.5 mg/dL (ref 0.3–1.2)
Total Protein: 6.2 g/dL — ABNORMAL LOW (ref 6.5–8.1)

## 2021-02-08 LAB — SEDIMENTATION RATE: Sed Rate: 7 mm/hr (ref 0–16)

## 2021-02-08 LAB — C-REACTIVE PROTEIN: CRP: 1 mg/dL — ABNORMAL HIGH (ref <1.0)

## 2021-02-08 IMAGING — CR DG CHEST 2V
2 series · 2 of 2 positions shown · non-contrast
Comparison: [DATE]

CLINICAL DATA: Hypoxia

EXAM:
CHEST - 2 VIEW

[chest pa]
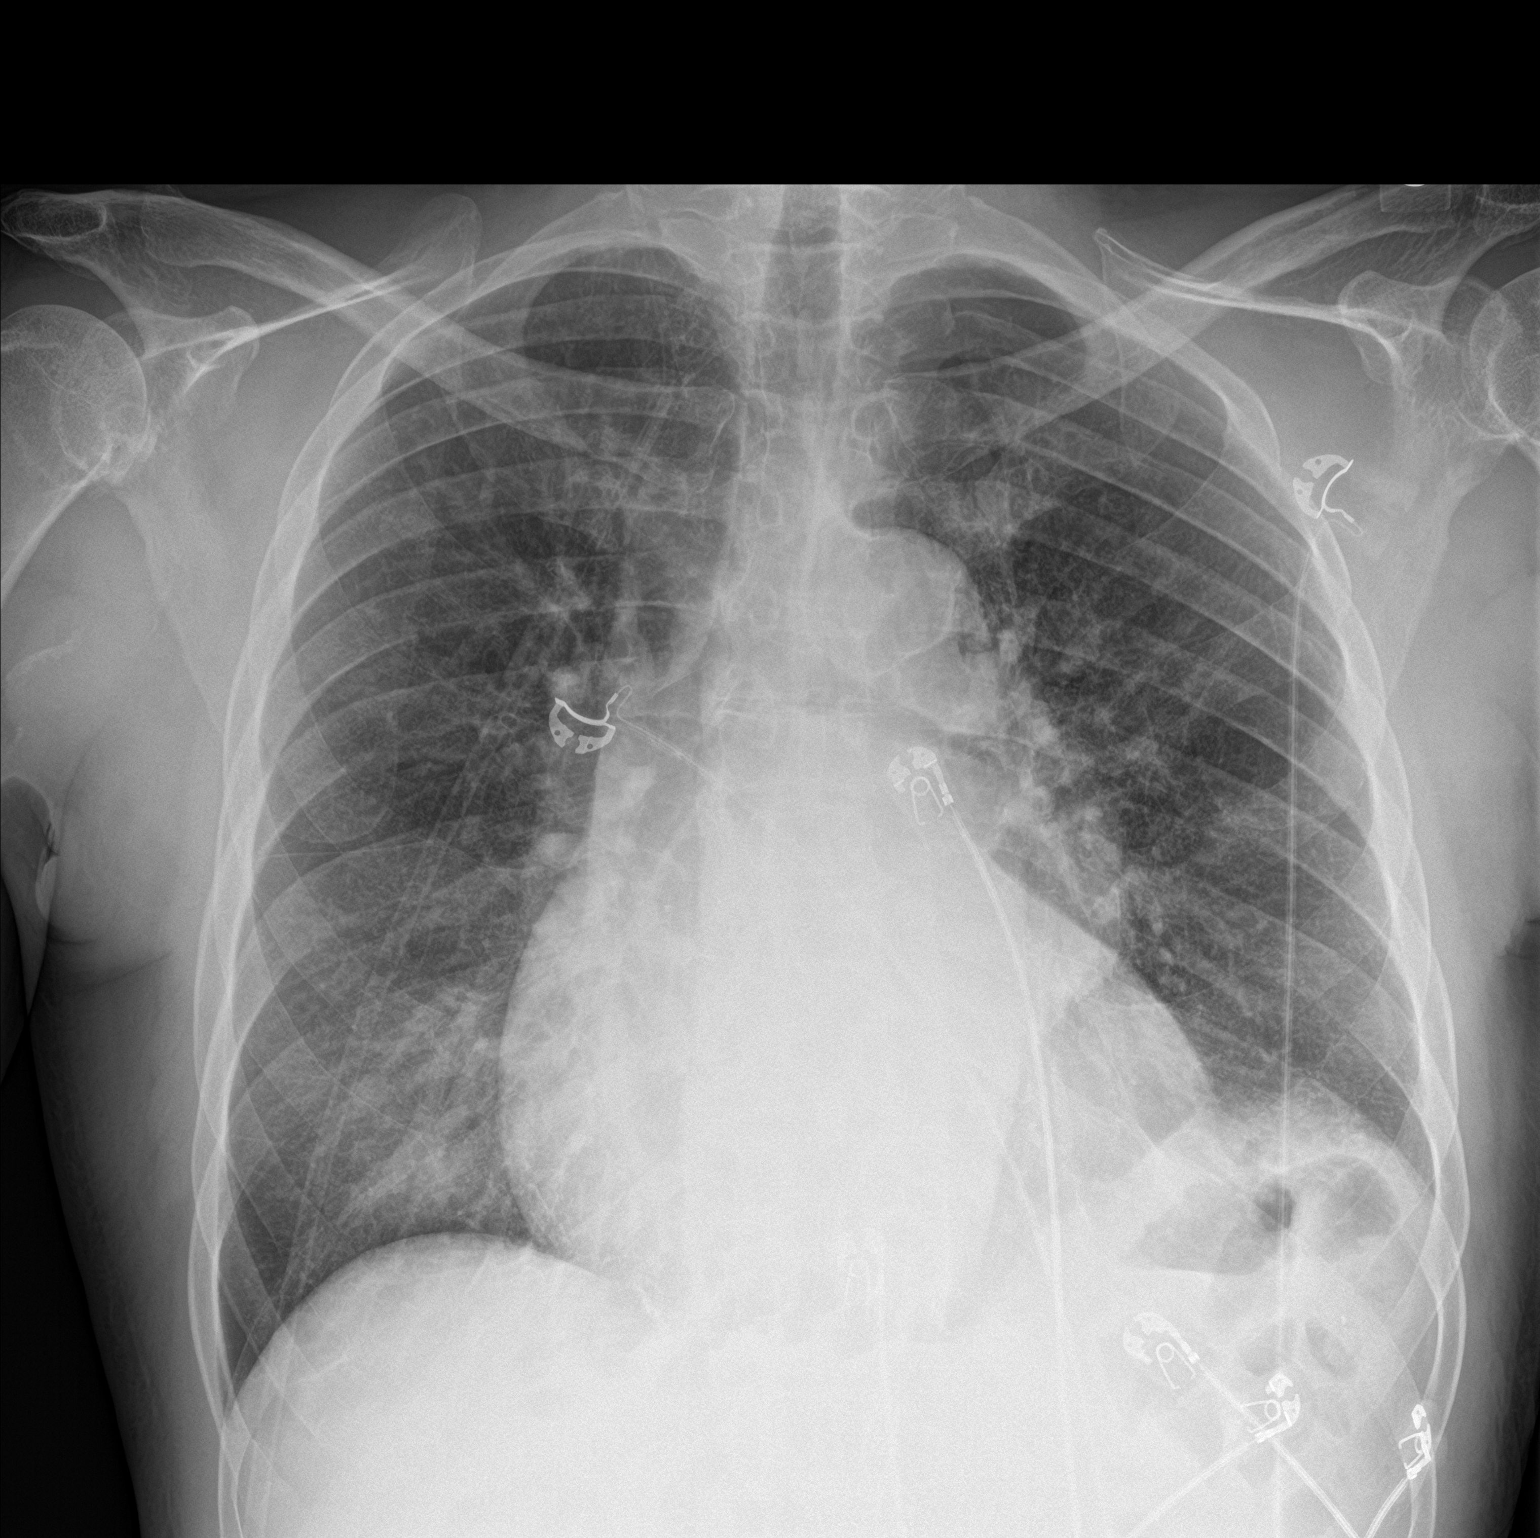

[chest lat]
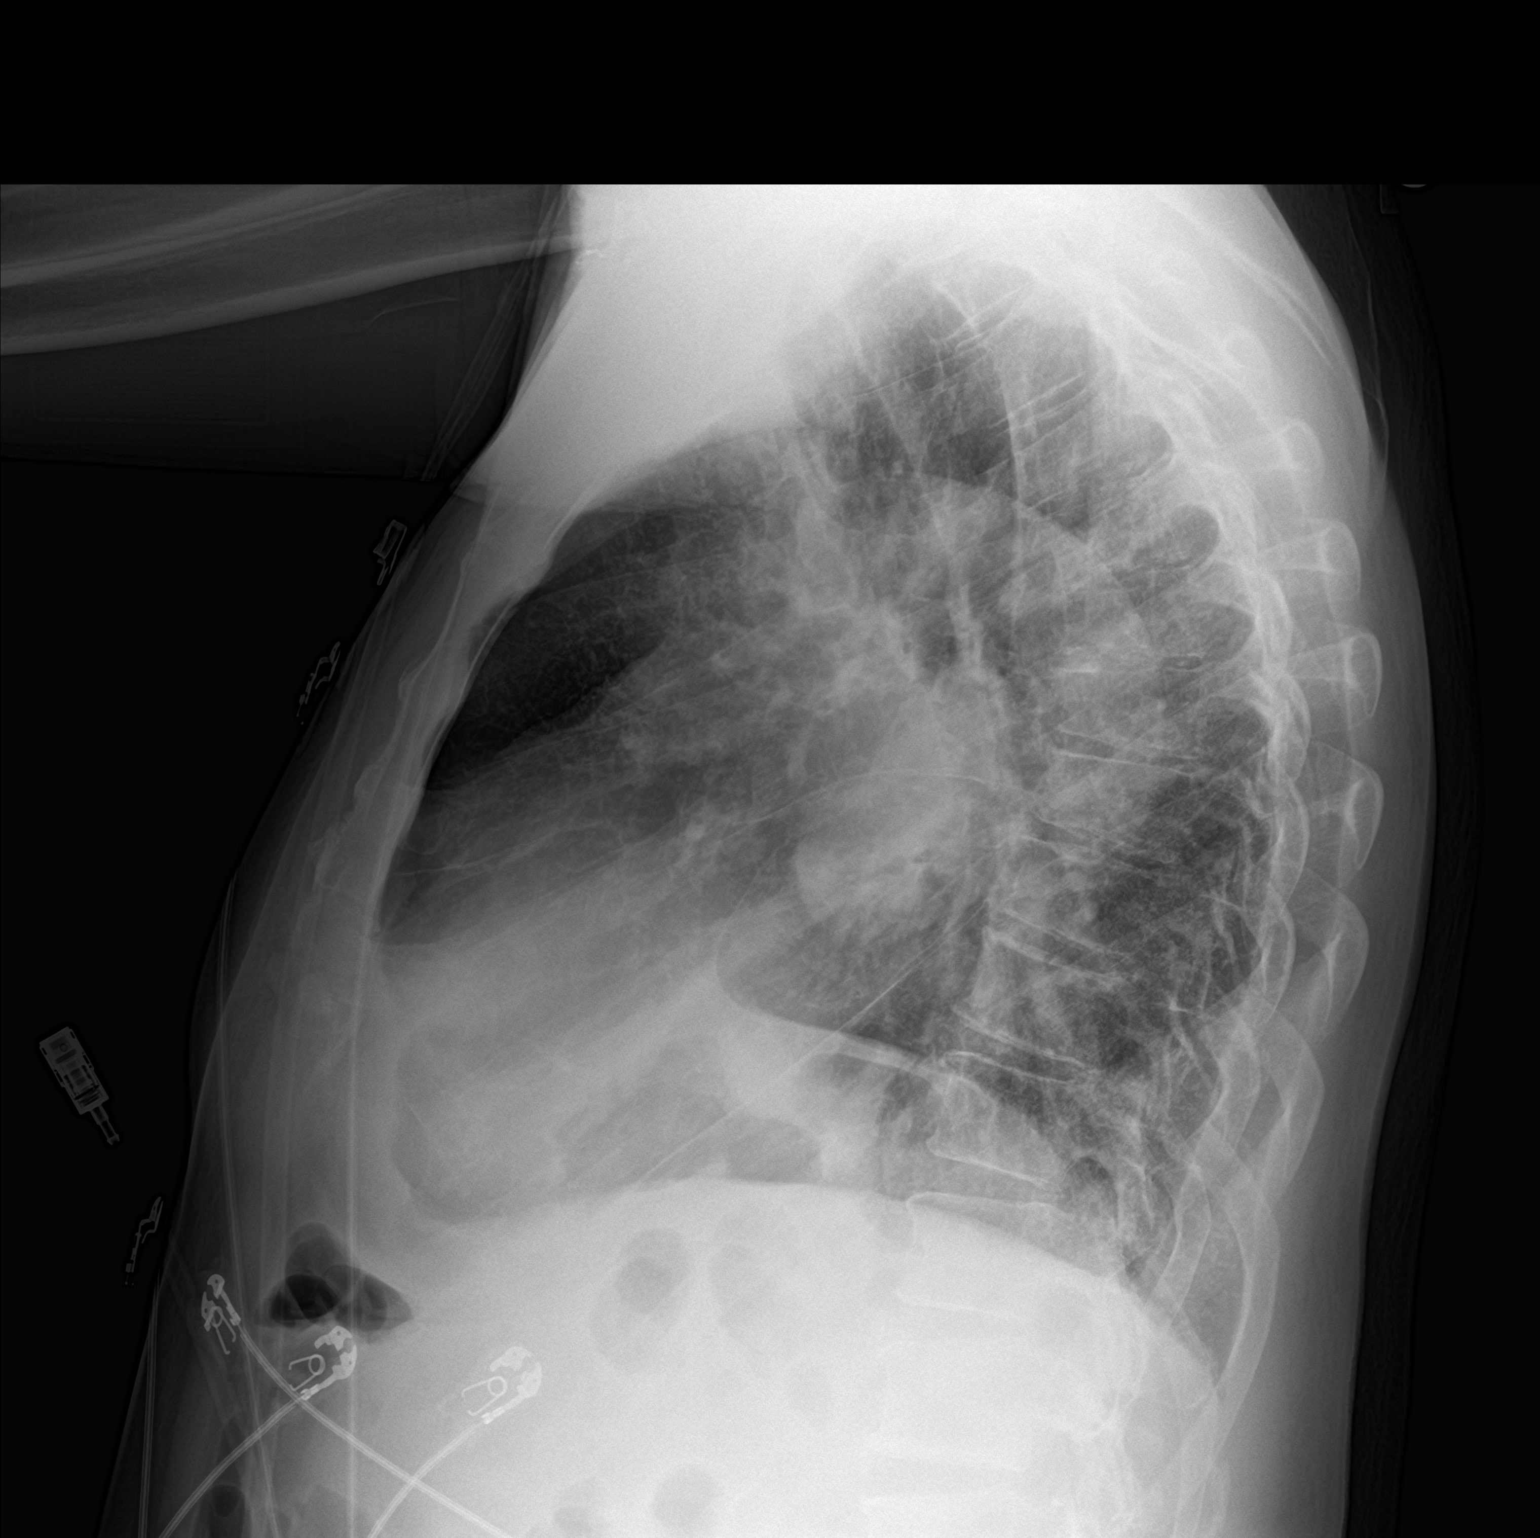

[2 of 2 positions shown; findings below may reference images not displayed]

FINDINGS: Cardiomegaly. No frank interstitial edema. Mild left basilar
atelectasis. No pleural effusion or pneumothorax.

Visualized osseous structures are within normal limits.
IMPRESSION: Cardiomegaly.  No frank interstitial edema.

## 2021-02-08 IMAGING — MR MR LUMBAR SPINE WO/W CM
6 of 8 series · 23 of 48 positions shown · IV contrast (gadavist)
Comparison: Lumbar spine MRI [DATE]

CLINICAL DATA: Low back pain, history of discitis and current
infected bursa

EXAM:
MRI LUMBAR SPINE WITHOUT AND WITH CONTRAST
TECHNIQUE: Multiplanar and multiecho pulse sequences of the lumbar spine were
obtained without and with intravenous contrast.
CONTRAST:  7mL GADAVIST GADOBUTROL 1 MMOL/ML IV SOLN

[Series 5: T2 · sagittal · 4.0mm · 0.76mm/px · 3 of 18 slices shown (1 of 2)]
[im 1/18]
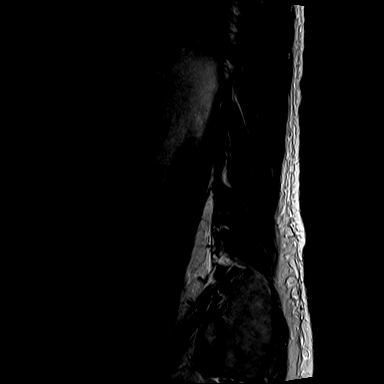
[im 9/18]
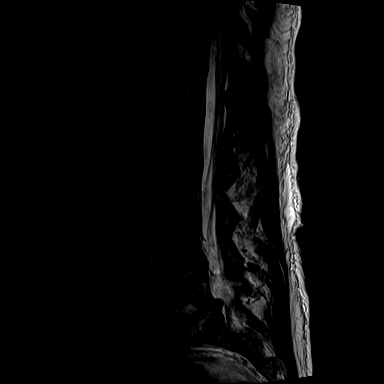
[im 18/18]
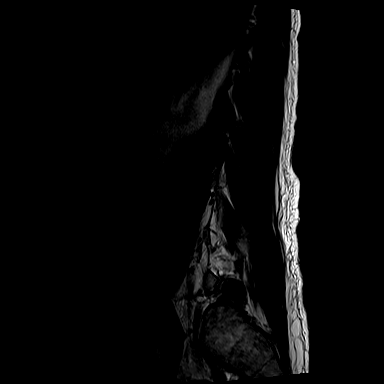

[Series 6: STIR · sagittal · 4.0mm · 0.55mm/px · 1 of 18 slices shown]
[im 1/18]
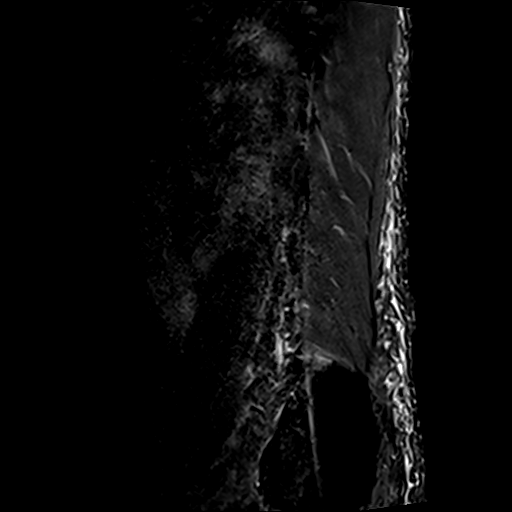

[Series 7: T1 · sagittal · 4.0mm · 0.88mm/px · 3 of 18 slices shown (1 of 2)]
[im 1/18]
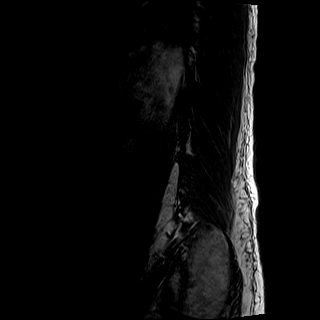
[im 9/18]
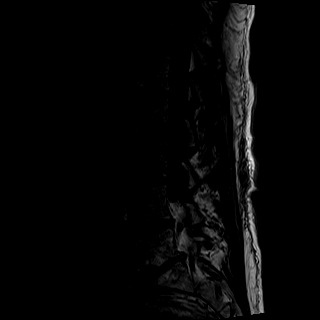
[im 18/18]
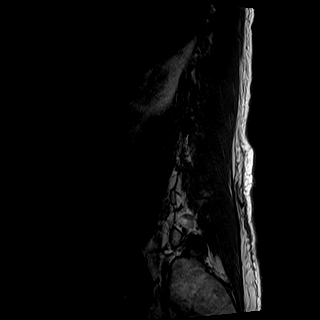

[Series 8: T2 · axial · 4.0mm · 0.57mm/px · z∈[-59,+175]mm · 7 of 48 slices shown (2 of 2)]
[im 1/48]
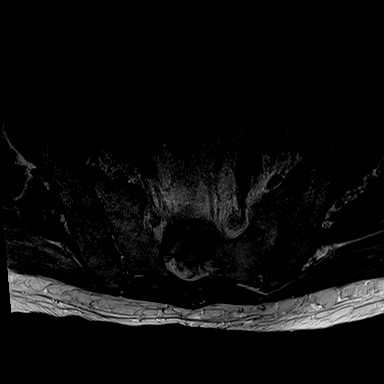
[im 8/48]
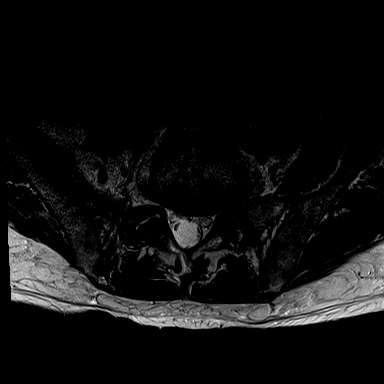
[im 16/48]
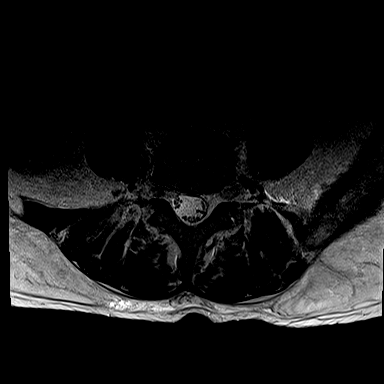
[im 24/48]
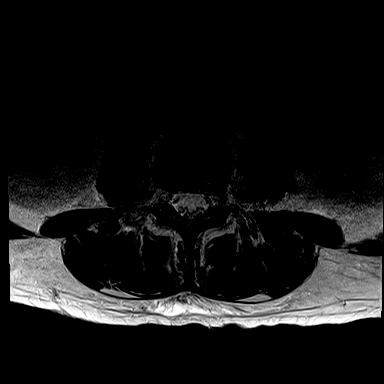
[im 32/48]
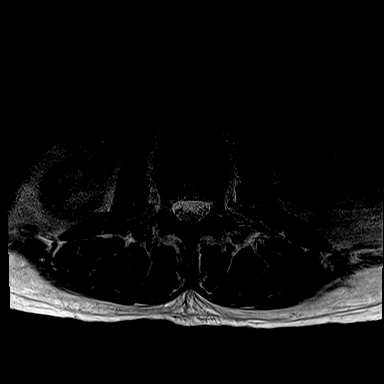
[im 40/48]
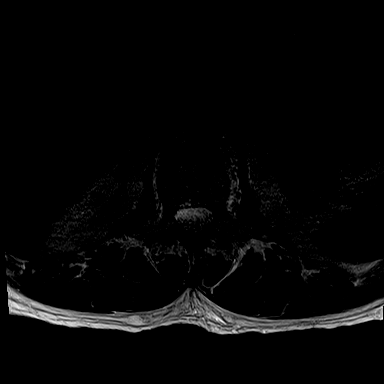
[im 48/48]
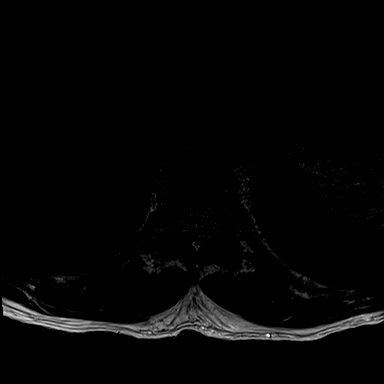

[Series 9: T1 · axial · 4.0mm · 0.34mm/px · z∈[-68,+139]mm · 6 of 39 slices shown (2 of 2)]
[im 1/39]
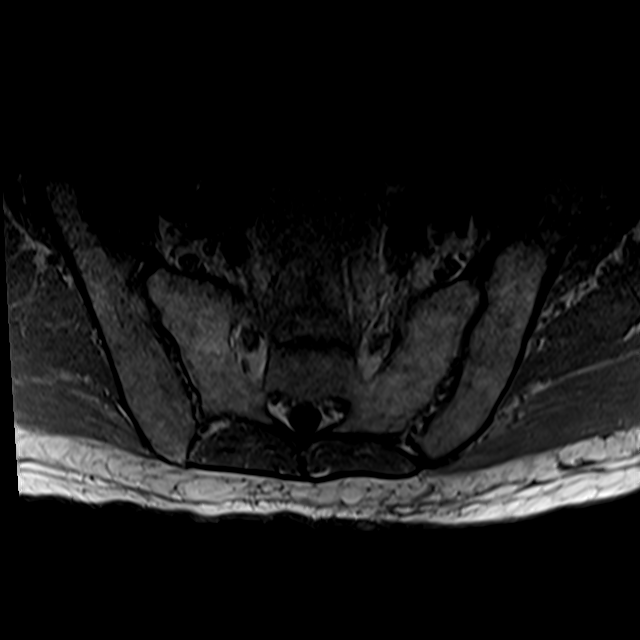
[im 8/39]
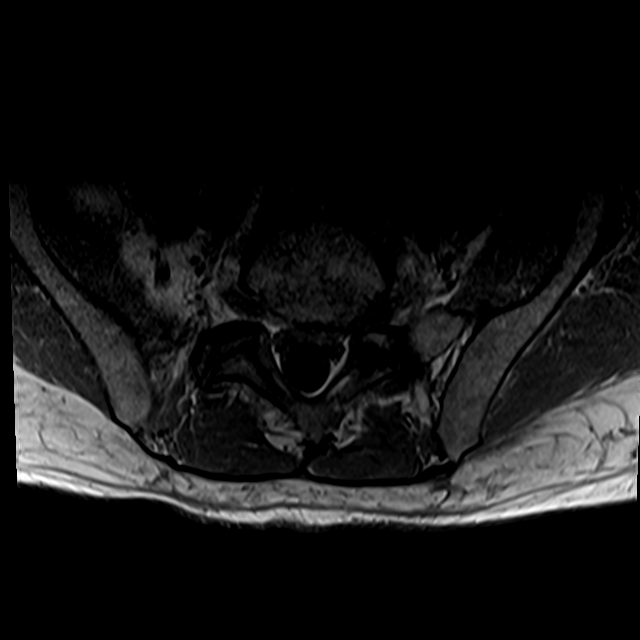
[im 16/39]
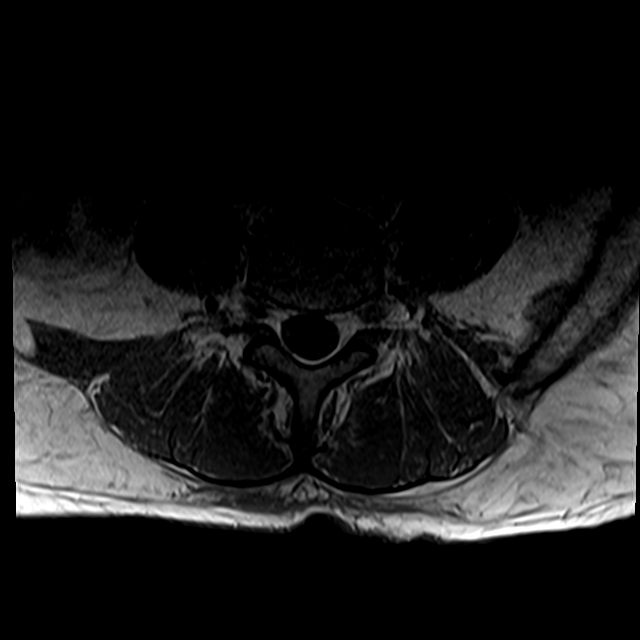
[im 23/39]
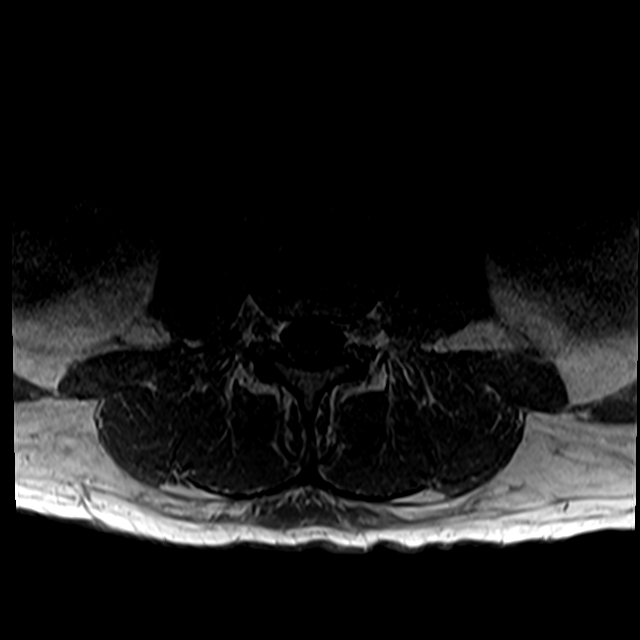
[im 31/39]
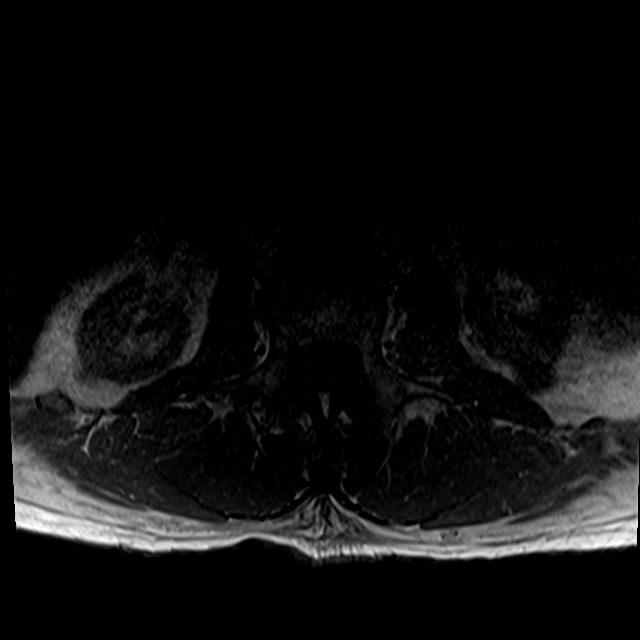
[im 39/39]
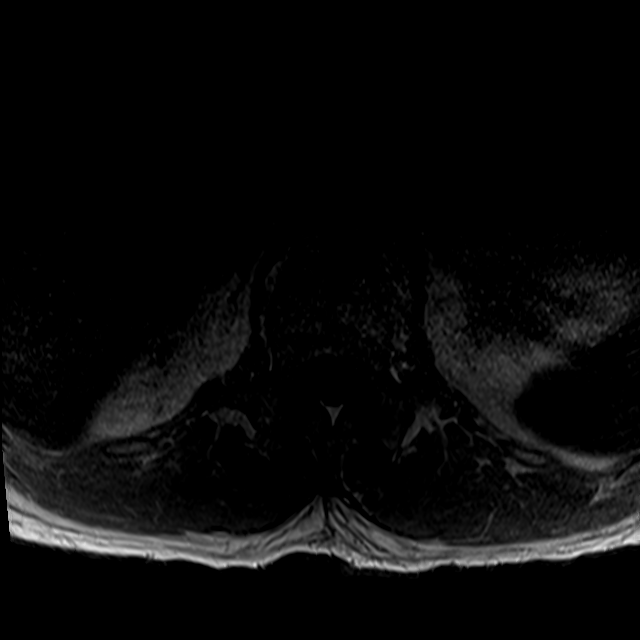

[Series 10: T1 fat-sat post-contrast · sagittal · 4.0mm · 0.88mm/px · 3 of 18 slices shown]
[im 1/18]
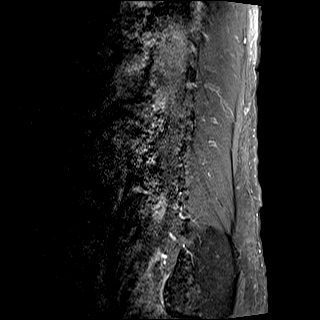
[im 9/18]
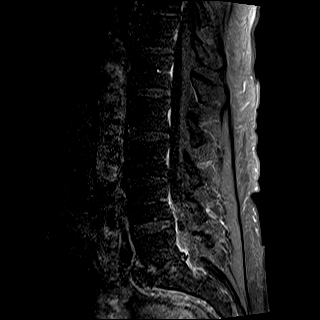
[im 18/18]
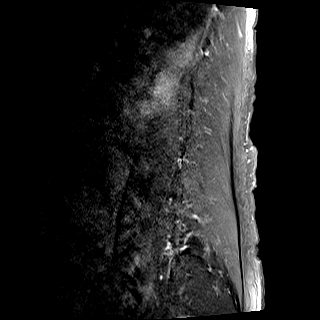

[23 of 48 positions shown; findings below may reference images not displayed]

FINDINGS: Segmentation: Standard; the lowest formed disc space is designated
L5-S1.

Alignment:  Normal.

Vertebrae: Vertebral body heights are preserved. There is mild
T2/STIR hyperintensity along the L5-S1 endplates adjacent to the
disc with mild enhancement. The marrow edema is decreased compared
to the study from [DATE]. There is no evidence of septic
arthritis. There is no evidence of epidural fluid collection.

There is minimal degenerative endplate marrow signal abnormality
antero inferiorly at L1 and L2. There is no other abnormal marrow
edema or enhancement. There is no suspicious marrow signal
abnormality.

Conus medullaris and cauda equina: Conus extends to the L1 level.
Conus and cauda equina appear normal. There is no abnormal
enhancement of the cauda equina nerve roots.

Paraspinal and other soft tissues: There is mild perifacetal soft
tissue edema at L4-L5 and L5-S1.

The kidneys are atrophic. A nonenhancing exophytic lesion arising
from the left kidney likely reflects a cyst. There is mild edema in
the soft tissues along the anterior L5 and S1 endplates, similar to
the prior study from [DATE].

Disc levels:

The disc spaces of the and L5-S1 are overall preserved.

T12-L1: No significant spinal canal or neural foraminal stenosis.

L1-L2: No significant spinal canal or neural foraminal stenosis

L2-L3: No significant spinal canal or neural foraminal stenosis

L3-L4: No significant spinal canal or neural foraminal stenosis

L4-L5: There is mild left worse than right facet arthropathy with
trace effusions without significant spinal canal or neural foraminal
stenosis.

L5-S1: There is intervertebral disc space narrowing with a disc
bulge eccentric to the left with bilateral foraminal components, and
moderate left worse than right facet arthropathy with small
bilateral pleural effusions resulting in moderate bilateral neural
foraminal stenosis without significant spinal canal stenosis.
IMPRESSION: 1. Marrow edema at L5-S1 has significantly decreased since
[DATE]. There is mild enhancement along the disc space without
evidence of progressed osseous destruction, no evidence of septic
arthritis, and no evidence of epidural fluid collection. Findings
are not convincing for discitis/osteomyelitis, though indolent
infection would be difficult to entirely exclude.
2. Facet arthropathy at L4-L5 and L5-S1, worse at L5-S1, with trace
effusions and mild perifacetal soft tissue edema. As above, no
evidence of septic arthritis.
3. Moderate bilateral neural foraminal stenosis at L5-S1. No other
significant spinal canal or neural foraminal stenosis.

## 2021-02-08 MED ORDER — LORAZEPAM 2 MG/ML IJ SOLN
1.0000 mg | Freq: Once | INTRAMUSCULAR | Status: AC | PRN
  Administered 2021-02-08: 1 mg via INTRAVENOUS
  Filled 2021-02-08: qty 1

## 2021-02-08 MED ORDER — SODIUM ZIRCONIUM CYCLOSILICATE 5 G PO PACK
5.0000 g | PACK | Freq: Once | ORAL | Status: AC
  Administered 2021-02-08: 5 g via ORAL
  Filled 2021-02-08: qty 1

## 2021-02-08 MED ORDER — CEPHALEXIN 250 MG PO CAPS: 250.0000 mg | ORAL_CAPSULE | Freq: Two times a day (BID) | ORAL | 0 refills | Status: DC

## 2021-02-08 MED ORDER — METHOCARBAMOL 500 MG PO TABS: 500.0000 mg | ORAL_TABLET | Freq: Two times a day (BID) | ORAL | 0 refills | Status: DC | PRN

## 2021-02-08 MED ORDER — GADOBUTROL 1 MMOL/ML IV SOLN
7.0000 mL | Freq: Once | INTRAVENOUS | Status: AC | PRN
  Administered 2021-02-08: 7 mL via INTRAVENOUS

## 2021-02-08 MED ORDER — CYCLOBENZAPRINE HCL 10 MG PO TABS
5.0000 mg | ORAL_TABLET | Freq: Once | ORAL | Status: AC
  Administered 2021-02-08: 5 mg via ORAL
  Filled 2021-02-08: qty 1

## 2021-02-08 NOTE — ED Provider Notes (Signed)
[facility] EMERGENCY DEPARTMENT Provider Note   CSN: [id number] Arrival date & time: 02/08/21  1221     History  Chief Complaint  Patient presents with   Joint Swelling   Back Pain    Jason [name]. is a 61 y.o. male presenting for evaluation of neck pain and left elbow swelling.  Patient states he developed back pain yesterday.  It began while he was just laying down.  Pain is present with specific movements, but no pain at rest.  He has had similar symptoms before, states he was treated with muscle relaxers and pain medicine.  He denies fall or injury.  Pain does not radiate down his legs.  No loss of bowel bladder control.  No numbness or tingling.  He was unable to go to dialysis yesterday due to the amount of pain he is in. Additionally, patient states he was called and told to come to the ER for further evaluation of his left elbow.  He had the fluid drained from his left elbow last week, the fluid was infected.  He is not currently on any antibiotics.  Patient states pain has been improving daily since the procedure.  He denies fevers or chills.  No weakness or confusion.  No difficulty moving his elbow.  He is not taking anything for it. He denies chest pain, shortness of breath, cough, nausea, vomiting, abdominal pain.  He has a dialysis patient that goes Tuesday, Thursday, Saturday.  Last session was Thursday.  He does not feel fluid overloaded, short of breath, or like he needs dialysis urgently.  HPI     Home Medications Prior to Admission medications   Medication Sig Start Date End Date Taking? Authorizing Provider  amLODipine (NORVASC) 10 MG tablet Take 10 mg by mouth every morning. 06/20/18   [provider]  carvedilol (COREG) 25 MG tablet Take 1 tablet (25 mg total) by mouth 2 (two) times daily with a meal. 02/02/18   [name], FNP  doxercalciferol (HECTOROL) 0.5 MCG capsule At dialysis 02/21/20 02/19/21  [provider]   hydrALAZINE (APRESOLINE) 100 MG tablet Take 1 tablet (100 mg total) by mouth 3 (three) times daily. Patient taking differently: Take 100 mg by mouth 2 (two) times daily. 11/03/19 01/30/21  Darliss Cheney, MD  iron sucrose in sodium chloride 0.9 % 100 mL Iron Sucrose (Venofer) 02/26/20 02/17/21  [provider]  methocarbamol (ROBAXIN) 500 MG tablet Take 1 tablet (500 mg total) by mouth every 8 (eight) hours as needed for muscle spasms. Patient not taking: Reported on 01/30/2021 07/22/20   Caren Griffins, MD  Methoxy PEG-Epoetin Beta (MIRCERA IJ) Mircera 02/12/20 02/10/21  [provider]  multivitamin (RENA-VIT) TABS tablet Take 1 tablet by mouth every morning. 10/20/17   [provider]  polyvinyl alcohol (LIQUIFILM TEARS) 1.4 % ophthalmic solution Place 1 drop into both eyes daily as needed for dry eyes.    [provider]      Allergies    Lisinopril    Review of Systems   Review of Systems  Musculoskeletal:  Positive for back pain and joint swelling.  All other systems reviewed and are negative.  Physical Exam Updated Vital Signs BP (!) 153/92 (BP Location: Right Arm)    Pulse 68    Temp 98.3 F (36.8 C) (Oral)    Resp 17    Ht 6\' 2"  (1.88 m)    Wt 77.1 kg    SpO2 94%  BMI 21.83 kg/m  Physical Exam Vitals and nursing note reviewed.  Constitutional:      General: He is not in acute distress.    Appearance: Normal appearance.     Comments: nontoxic  HENT:     Head: Normocephalic and atraumatic.  Eyes:     Conjunctiva/sclera: Conjunctivae normal.     Pupils: Pupils are equal, round, and reactive to light.  Cardiovascular:     Rate and Rhythm: Normal rate and regular rhythm.     Pulses: Normal pulses.     Comments: Thrill of RUE av fistula Pulmonary:     Effort: Pulmonary effort is normal. No respiratory distress.     Breath sounds: Normal breath sounds. No wheezing.     Comments: Speaking in full sentences.  Clear lung sounds in all  fields. Abdominal:     General: There is no distension.     Palpations: Abdomen is soft.     Tenderness: There is no abdominal tenderness.  Musculoskeletal:     Cervical back: Normal range of motion and neck supple.     Comments: Swelling of the posterior left elbow consistent with bursitis.  No erythema or warmth.  Full active range of motion of the elbow without pain.  No tenderness palpation of the area.  Radial pulse 2+. No tenderness palpation of back or midline spine.  No step-offs or deformities.  Patient does have significant pain when moving from one position to the other, but at rest he has no tenderness with palpation. No saddle anesthesia. Pedal pulses 2+  Skin:    General: Skin is warm and dry.     Capillary Refill: Capillary refill takes less than 2 seconds.  Neurological:     Mental Status: He is alert and oriented to person, place, and time.  Psychiatric:        Mood and Affect: Mood and affect normal.        Speech: Speech normal.        Behavior: Behavior normal.    ED Results / Procedures / Treatments   Labs (all labs ordered are listed, but only abnormal results are displayed) Labs Reviewed  CULTURE, BLOOD (ROUTINE X 2)  CULTURE, BLOOD (ROUTINE X 2)  CBC WITH DIFFERENTIAL/PLATELET  COMPREHENSIVE METABOLIC PANEL  SEDIMENTATION RATE  C-REACTIVE PROTEIN    EKG None  Radiology No results found.  Procedures Procedures    Medications Ordered in ED Medications  cyclobenzaprine (FLEXERIL) tablet 5 mg (has no administration in time range)  LORazepam (ATIVAN) injection 1 mg (has no administration in time range)    ED Course/ Medical Decision Making/ A&P                           Medical Decision Making Amount and/or Complexity of Data Reviewed Labs: ordered. Radiology: ordered.  Risk Prescription drug management.    This patient presents to the ED for concern of back pain and infected elbow. This involves an extensive number of treatment  options, and is a complaint that carries with it a moderate risk of complications and morbidity.  The differential diagnosis includes muscle spasm, spinal stenosis, discitis, vertebral injury, infected bursa, septic joint, sepsis.   Co morbidities:  ESRD on dialysis   Additional history: Reviewed hospitalization from the summer when patient was seen for back pain and had an MRI that was concerning for discitis versus inflammation versus bony changes due to dialysis.  He had negative joint aspiration, was  monitored with infectious disease but symptoms improved without antibiotics. Reviewed recent fluid collection from bursa aspiration.  Positive for staph aureus.   Lab Tests:  I ordered, and personally interpreted labs.  The pertinent results include: Mild hyperkalemia of 5.8.  Similar to previous.  Will give Lokelma and have patient follow-up for dialysis tomorrow.  He does not appear acutely fluid overloaded, does not appear to need emergent dialysis today.  CRP minimally elevated, but sed rate is normal.  No leukocytosis.  Blood cultures are pending.   Imaging Studies:  I ordered imaging studies including MRI lumbar spine to ensure no discitis. In the setting of back pain, high risk, and known infection of the elbow, MRI ordered.   MRI pending at sign out  Medicines ordered:  I ordered medication including flexeril for msk spasm.    Pt signed out to Conard Novak, PA-C for follow-up on MRI.   Final Clinical Impression(s) / ED Diagnoses Final diagnoses:  None    Rx / DC Orders ED Discharge Orders     None         Franchot Heidelberg, PA-C 02/08/21 1545    Hayden Rasmussen, MD 02/09/21 [phone]

## 2021-02-08 NOTE — Discharge Instructions (Addendum)
Take antibiotics as prescribed.  Take the course, even if symptoms improve.  On dialysis days, take antibiotics after dialysis. Use ice and compression as needed for inflammation of the elbow. Use Tylenol as needed for back pain.  Use Robaxin to help with muscle spasm, stiffness, soreness.  This may make you tired or groggy. Follow-up with your primary care doctor for recheck of your back and elbow. Call your dialysis center tomorrow to get dialysis for mildly elevated potassium. Return to the emergency room with any new, worsening, concerning symptoms

## 2021-02-08 NOTE — ED Triage Notes (Signed)
Pt states missed dialysis yesterday d/t lower back pain.  Missed dialysis sat d/t pain.  Also c/o L elbow infection  that is improving.

## 2021-02-08 NOTE — ED Provider Notes (Signed)
°  I assumed care of patient at shift change from previous team, please see their note for full H&P. Briefly patient is here for evaluation of worsening back pain and concern for septic bursitis. He does have a remote history of discitis.  At the time I assumed his care his work-up has been completed except for pending MRI results.    Clinical Course as of 02/09/21 0043  Sun Jan 29, 554  5551 61 year old end-stage renal disease patient planing of some increased back pain, history of chronic back pain.  Ulcer was culture positive for an bursa aspiration.  Ended up growing out staph.  He has prior history of some inflammatory changes in his back concerning for discitis so underwent an MRI which shows improvement in the inflammatory changes of the spine.  Currently no indications for admission.  K mildly elevated at 5.8 and will give some Lokelma. [MB]    Clinical Course User Index [MB] Hayden Rasmussen, MD   Medical Decision Making Patient's MRI is reviewed, he does not have evidence of acute discitis.  Prior to MRI patient received 2 sedating medications, and since coming back from MRI he has been requiring oxygen to maintain his saturations. Chest x-ray is obtained without frank pulmonary edema. Patient was monitored.  He awakens, is able to ambulate and does not appear to be confused, however when he is at rest and falls back asleep he again becomes hypoxic. I question if he has an underlying element of sleep apnea.  He does not normally wear oxygen. Additionally he missed his dialysis on Saturday and is not due for repeat dialysis until Tuesday. He was found to be hyperkalemic here and Lokelma was ordered prior to me assuming his care. He is also significantly hypertensive.  Home meds are ordered p.o. Patient was observed in the emergency room, and after over 6 hours from getting the sedating medications he had not adequately metabolized to them to come off oxygen when resting/asleep. Other  causes of his hypoxia were considered, chest x-ray does not show pneumonia, pneumothorax or other acute abnormalities.  Flu/COVID testing is ordered. Patient does not appear to require emergent dialysis tonight, however he does appear to require admission for ongoing support pending dialysis and reassessment.  Patient will require admission.   Amount and/or Complexity of Data Reviewed Labs: ordered. Radiology: ordered.  Risk Prescription drug management.   I spoke with Dr. Cyd Silence who will see the patient for admission.  The patient appears reasonably stabilized for admission considering the current resources, flow, and capabilities available in the ED at this time, and I doubt any other [facility] requiring further screening and/or treatment in the ED prior to admission assuming timely admission and bed placement.  Note: Portions of this report may have been transcribed using voice recognition software. Every effort was made to ensure accuracy; however, inadvertent computerized transcription errors may be present          Jason [name] 02/09/21 0113    Hayden Rasmussen, MD 02/09/21 [phone]

## 2021-02-08 NOTE — ED Notes (Signed)
Pt ambulated around nursing station without difficulty.  However, O2 sats cannot increase above 90%.  PA notified.

## 2021-02-09 ENCOUNTER — Encounter (HOSPITAL_COMMUNITY): Payer: Self-pay | Admitting: Internal Medicine

## 2021-02-09 DIAGNOSIS — E875 Hyperkalemia: Secondary | ICD-10-CM

## 2021-02-09 DIAGNOSIS — N186 End stage renal disease: Secondary | ICD-10-CM | POA: Diagnosis not present

## 2021-02-09 DIAGNOSIS — I160 Hypertensive urgency: Secondary | ICD-10-CM

## 2021-02-09 DIAGNOSIS — M71122 Other infective bursitis, left elbow: Secondary | ICD-10-CM

## 2021-02-09 DIAGNOSIS — M545 Low back pain, unspecified: Secondary | ICD-10-CM | POA: Diagnosis not present

## 2021-02-09 DIAGNOSIS — I5033 Acute on chronic diastolic (congestive) heart failure: Secondary | ICD-10-CM

## 2021-02-09 DIAGNOSIS — Z992 Dependence on renal dialysis: Secondary | ICD-10-CM

## 2021-02-09 DIAGNOSIS — R0902 Hypoxemia: Secondary | ICD-10-CM | POA: Diagnosis not present

## 2021-02-09 LAB — COMPREHENSIVE METABOLIC PANEL
ALT: 19 U/L (ref 0–44)
AST: 15 U/L (ref 15–41)
Albumin: 3.1 g/dL — ABNORMAL LOW (ref 3.5–5.0)
Alkaline Phosphatase: 38 U/L (ref 38–126)
Anion gap: 16 — ABNORMAL HIGH (ref 5–15)
BUN: 115 mg/dL — ABNORMAL HIGH (ref 6–20)
CO2: 23 mmol/L (ref 22–32)
Calcium: 8.7 mg/dL — ABNORMAL LOW (ref 8.9–10.3)
Chloride: 97 mmol/L — ABNORMAL LOW (ref 98–111)
Creatinine, Ser: 14.91 mg/dL — ABNORMAL HIGH (ref 0.61–1.24)
GFR, Estimated: 3 mL/min — ABNORMAL LOW (ref >60)
Glucose, Bld: 98 mg/dL (ref 70–99)
Potassium: 5.6 mmol/L — ABNORMAL HIGH (ref 3.5–5.1)
Sodium: 136 mmol/L (ref 135–145)
Total Bilirubin: 0.4 mg/dL (ref 0.3–1.2)
Total Protein: 6.2 g/dL — ABNORMAL LOW (ref 6.5–8.1)

## 2021-02-09 LAB — CBC WITH DIFFERENTIAL/PLATELET
Abs Immature Granulocytes: 0.03 10*3/uL (ref 0.00–0.07)
Basophils Absolute: 0.1 10*3/uL (ref 0.0–0.1)
Basophils Relative: 1 %
Eosinophils Absolute: 0.6 10*3/uL — ABNORMAL HIGH (ref 0.0–0.5)
Eosinophils Relative: 11 %
HCT: 31.2 % — ABNORMAL LOW (ref 39.0–52.0)
Hemoglobin: 10.2 g/dL — ABNORMAL LOW (ref 13.0–17.0)
Immature Granulocytes: 1 %
Lymphocytes Relative: 13 %
Lymphs Abs: 0.7 10*3/uL (ref 0.7–4.0)
MCH: 31.1 pg (ref 26.0–34.0)
MCHC: 32.7 g/dL (ref 30.0–36.0)
MCV: 95.1 fL (ref 80.0–100.0)
Monocytes Absolute: 0.6 10*3/uL (ref 0.1–1.0)
Monocytes Relative: 10 %
Neutro Abs: 3.4 10*3/uL (ref 1.7–7.7)
Neutrophils Relative %: 64 %
Platelets: 204 10*3/uL (ref 150–400)
RBC: 3.28 MIL/uL — ABNORMAL LOW (ref 4.22–5.81)
RDW: 13.8 % (ref 11.5–15.5)
WBC: 5.4 10*3/uL (ref 4.0–10.5)
nRBC: 0 % (ref 0.0–0.2)

## 2021-02-09 LAB — BRAIN NATRIURETIC PEPTIDE: B Natriuretic Peptide: 2308.6 pg/mL — ABNORMAL HIGH (ref 0.0–100.0)

## 2021-02-09 LAB — HEPATITIS B SURFACE ANTIBODY,QUALITATIVE: Hep B S Ab: NONREACTIVE

## 2021-02-09 LAB — HEPATITIS B SURFACE ANTIGEN: Hepatitis B Surface Ag: NONREACTIVE

## 2021-02-09 LAB — RESP PANEL BY RT-PCR (FLU A&B, COVID) ARPGX2
Influenza A by PCR: NEGATIVE
Influenza B by PCR: NEGATIVE
SARS Coronavirus 2 by RT PCR: NEGATIVE

## 2021-02-09 LAB — MAGNESIUM: Magnesium: 2.8 mg/dL — ABNORMAL HIGH (ref 1.7–2.4)

## 2021-02-09 MED ORDER — VANCOMYCIN HCL 750 MG/150ML IV SOLN
750.0000 mg | Freq: Once | INTRAVENOUS | Status: AC
  Administered 2021-02-09: 750 mg via INTRAVENOUS
  Filled 2021-02-09: qty 150

## 2021-02-09 MED ORDER — CHLORHEXIDINE GLUCONATE CLOTH 2 % EX PADS
6.0000 | MEDICATED_PAD | Freq: Every day | CUTANEOUS | Status: DC
Start: 2021-02-09 — End: 2021-02-10

## 2021-02-09 MED ORDER — ACETAMINOPHEN 650 MG RE SUPP: 650.0000 mg | Freq: Four times a day (QID) | RECTAL | Status: DC | PRN

## 2021-02-09 MED ORDER — VANCOMYCIN HCL 750 MG/150ML IV SOLN: 750.0000 mg | INTRAVENOUS | Status: DC

## 2021-02-09 MED ORDER — SODIUM CHLORIDE 0.9 % IV SOLN: 100.0000 mL | INTRAVENOUS | Status: DC | PRN

## 2021-02-09 MED ORDER — CARVEDILOL 12.5 MG PO TABS
25.0000 mg | ORAL_TABLET | Freq: Two times a day (BID) | ORAL | Status: DC
  Administered 2021-02-09: 25 mg via ORAL
  Filled 2021-02-09 (×2): qty 2

## 2021-02-09 MED ORDER — HYDRALAZINE HCL 20 MG/ML IJ SOLN: 10.0000 mg | Freq: Four times a day (QID) | INTRAMUSCULAR | Status: DC | PRN

## 2021-02-09 MED ORDER — PENTAFLUOROPROP-TETRAFLUOROETH EX AERO
1.0000 "application " | INHALATION_SPRAY | CUTANEOUS | Status: DC | PRN
  Filled 2021-02-09: qty 116

## 2021-02-09 MED ORDER — LIDOCAINE-PRILOCAINE 2.5-2.5 % EX CREA
1.0000 "application " | TOPICAL_CREAM | CUTANEOUS | Status: DC | PRN
  Filled 2021-02-09: qty 5

## 2021-02-09 MED ORDER — ONDANSETRON HCL 4 MG/2ML IJ SOLN: 4.0000 mg | Freq: Four times a day (QID) | INTRAMUSCULAR | Status: DC | PRN

## 2021-02-09 MED ORDER — LIDOCAINE HCL (PF) 1 % IJ SOLN: 5.0000 mL | INTRAMUSCULAR | Status: DC | PRN

## 2021-02-09 MED ORDER — ALTEPLASE 2 MG IJ SOLR: 2.0000 mg | Freq: Once | INTRAMUSCULAR | Status: DC | PRN

## 2021-02-09 MED ORDER — AMLODIPINE BESYLATE 5 MG PO TABS
10.0000 mg | ORAL_TABLET | Freq: Every morning | ORAL | Status: DC
  Administered 2021-02-09: 10 mg via ORAL
  Filled 2021-02-09: qty 2

## 2021-02-09 MED ORDER — VANCOMYCIN HCL 1500 MG/300ML IV SOLN
1500.0000 mg | Freq: Once | INTRAVENOUS | Status: AC
  Administered 2021-02-09: 1500 mg via INTRAVENOUS
  Filled 2021-02-09: qty 300

## 2021-02-09 MED ORDER — HEPARIN SODIUM (PORCINE) 1000 UNIT/ML DIALYSIS
6000.0000 [IU] | INTRAMUSCULAR | Status: DC | PRN
  Filled 2021-02-09: qty 6

## 2021-02-09 MED ORDER — HYDRALAZINE HCL 50 MG PO TABS
100.0000 mg | ORAL_TABLET | Freq: Two times a day (BID) | ORAL | Status: DC
  Administered 2021-02-09: 100 mg via ORAL
  Filled 2021-02-09: qty 2

## 2021-02-09 MED ORDER — ACETAMINOPHEN 325 MG PO TABS
650.0000 mg | ORAL_TABLET | Freq: Four times a day (QID) | ORAL | Status: DC | PRN
  Administered 2021-02-09: 650 mg via ORAL
  Filled 2021-02-09: qty 2

## 2021-02-09 MED ORDER — HEPARIN SODIUM (PORCINE) 5000 UNIT/ML IJ SOLN: 5000.0000 [IU] | Freq: Three times a day (TID) | INTRAMUSCULAR | Status: DC

## 2021-02-09 MED ORDER — HYDRALAZINE HCL 25 MG PO TABS
100.0000 mg | ORAL_TABLET | Freq: Once | ORAL | Status: AC
  Administered 2021-02-09: 100 mg via ORAL
  Filled 2021-02-09: qty 4

## 2021-02-09 MED ORDER — DOXYCYCLINE MONOHYDRATE 100 MG PO TABS: 100.0000 mg | ORAL_TABLET | Freq: Two times a day (BID) | ORAL | 0 refills | Status: AC

## 2021-02-09 MED ORDER — POLYETHYLENE GLYCOL 3350 17 G PO PACK: 17.0000 g | PACK | Freq: Every day | ORAL | Status: DC | PRN

## 2021-02-09 MED ORDER — CYCLOBENZAPRINE HCL 10 MG PO TABS
10.0000 mg | ORAL_TABLET | Freq: Once | ORAL | Status: AC
  Administered 2021-02-09: 10 mg via ORAL
  Filled 2021-02-09: qty 1

## 2021-02-09 MED ORDER — HEPARIN SODIUM (PORCINE) 1000 UNIT/ML DIALYSIS
1000.0000 [IU] | INTRAMUSCULAR | Status: DC | PRN
  Filled 2021-02-09: qty 1

## 2021-02-09 MED ORDER — ONDANSETRON HCL 4 MG PO TABS: 4.0000 mg | ORAL_TABLET | Freq: Four times a day (QID) | ORAL | Status: DC | PRN

## 2021-02-09 MED ORDER — DOXYCYCLINE HYCLATE 50 MG PO CAPS: 50.0000 mg | ORAL_CAPSULE | Freq: Two times a day (BID) | ORAL | 0 refills | Status: DC

## 2021-02-09 MED ORDER — RENA-VITE PO TABS
1.0000 | ORAL_TABLET | Freq: Every day | ORAL | Status: DC
  Filled 2021-02-09: qty 1

## 2021-02-09 NOTE — Progress Notes (Signed)
Pharmacy Antibiotic Note  Jason [name]. is a 61 y.o. male admitted on 02/08/2021 with back pain, recent elbow aspiration growing MSSA .  Pharmacy has been consulted for Vancomycin dosing.  Plan: Vancomycin 1500 mg IV now, then 750 mg IV after each HD F/U plan for HD  Height: 6\' 2"  (188 cm) Weight: 77.1 kg (170 lb) IBW/kg (Calculated) : 82.2  Temp (24hrs), Avg:98.4 F (36.9 C), Min:98 F (36.7 C), Max:98.7 F (37.1 C)  Recent Labs  Lab 02/08/21 1310  WBC 5.1  CREATININE 13.99*    Estimated Creatinine Clearance: 6.2 mL/min (A) (by C-G formula based on SCr of 13.99 mg/dL (H)).    Allergies  Allergen Reactions   Lisinopril Cough     Caryl Pina 02/09/2021 5:10 AM

## 2021-02-09 NOTE — ED Notes (Signed)
Discharge instructions reviewed with patient. Patient verbalized understanding of instructions. Follow-up care and medications were reviewed. Patient ambulatory with steady gait. VSS upon discharge.  ?

## 2021-02-09 NOTE — Assessment & Plan Note (Signed)
·   Consulting nephrology for consideration of dialysis this morning to address volume overload, hyperkalemia and hypertensive urgency

## 2021-02-09 NOTE — Assessment & Plan Note (Signed)
·   Patient presenting with low back pain  Patient has a pertinent past medical history of discitis of the lumbar spine in July 2022 treated with a course of antibiotic therapy  Cultures at that time did not grow out an organism  MRI performed on this presentation is not entirely compelling for recurrent discitis or osteomyelitis  Monitor closely

## 2021-02-09 NOTE — Assessment & Plan Note (Addendum)
•   Patient presenting with symptoms of lethargy, shortness of breath and bouts of hypoxia concerning for volume overload with acute diastolic congestive heart failure due to missed dialysis  Due to end-stage renal disease, modality of therapy will need to be dialysis  We will consult nephrology in the morning for initiation of dialysis while hospitalized  Strict input and output monitoring  Supplemental oxygen for bouts of hypoxia  Monitoring renal function and electrolytes with serial chemistires  Daily weights

## 2021-02-09 NOTE — Progress Notes (Signed)
02/09/21 1921  TOC ED Mini Assessment  TOC Time spent with patient (minutes): 20  PING Used in TOC Assessment No  Admission or Readmission Diverted Yes  Interventions which prevented an admission or readmission Transportation Screening  What brought you to the Emergency Department?  Missed dialysis d/t lower back pain  Barriers to Discharge No Barriers Identified  Barrier interventions CSW met with Pt at bedside to coordinate d/c transportaion via [employer] of departure Other (enter comment) Pharmacologist)

## 2021-02-09 NOTE — Assessment & Plan Note (Signed)
·   Patient exhibiting bouts of mild hypoxia in the emergency department requiring initiation of supplemental oxygen  Remainder of assessment and plan as above

## 2021-02-09 NOTE — ED Notes (Addendum)
Pt has 3+ swelling of left elbow and area under elbow. Pt has full ROM of elbow. The elbow swelling is fluid filled.

## 2021-02-09 NOTE — Assessment & Plan Note (Signed)
·   Diagnosis of bursitis during emergency department evaluation on 1/20  Patient underwent aspiration of the left olecranon bursa and was initially sent home without antibiotics  Cultures later grew out pansensitive Staph aureus and patient was instructed to come to the emergency department  Will treat patient with intravenous vancomycin while here  Patient can be quickly de-escalated and transitioned to oral antibiotic therapy at time of discharge

## 2021-02-09 NOTE — H&P (Signed)
History and Physical    Jason [name]. MVH:[id number] DOB: October 27, 1960 DOA: 02/08/2021  PCP: Pcp, No  Patient coming from: Home   Chief Complaint:  Chief Complaint  Patient presents with   Joint Swelling   Back Pain     HPI:    61 year old male with past medical history of end-stage renal disease (Tues, Thurs, Sat), HD at [facility]), hypertension, diastolic congestive heart failure (Echo 02/2019 EF 50-55% with G1DD), moderate pulmonary hypertension (RVSP 53.9mmHg), mild aortic stenosis (valve area 2.01cm2) who presents to [facility] emergency department with complaints of low back pain after missing dialysis yesterday.  Of note, patient recently presented to [facility] on 1/20 with left elbow pain and at that time was diagnosed with left olecranon bursitis undergoing aspiration of the bursa during that visit.  Cell count did reveal 7208 white blood cells however this was felt to be inflammatory the time and patient was discharged home without antibiotics.  Patient presented back to [facility] emergency department due to complaints of back pain and continued left elbow pain after being instructed to come back to the emergency department when the culture from the bursa aspiration on 1/20 ended up going up Staph aureus that was pansensitive.  Patient describes the low back pain as moderate to severe in intensity, sharp in quality, located in the low back, nonradiating, worse with movement and improved with rest.  Upon further questioning patient denies fevers, nausea, vomiting, sick contacts or recent travel.    Upon evaluation in the emergency department due to patient's complaints of back pain and Staph aureus growing from the recent culture MRI of the lumbar spine was performed that revealed mild enhancement along the disc base of L5-S1 but no convincing evidence of discitis or osteomyelitis.  During patient's emergency department evaluation  however patient continued to exhibit bouts of hypoxia and patient admitted to not going to his dialysis session the day prior.  Furthermore, patient was found to be slightly hyperkalemic for which Lokelma was administered.  Due to concerns for volume overload causing the patient's hypoxia ER providers requesting hospitalization for dialysis while hospitalized to avoid worsening hypoxia.  The hospitalist group has now been called to assess the patient for admission to the hospital.  Review of Systems:   Review of Systems  Constitutional:  Positive for malaise/fatigue.  Musculoskeletal:  Positive for back pain and joint pain.  Neurological:  Positive for weakness.  All other systems reviewed and are negative.  Past Medical History:  Diagnosis Date   A-V fistula ([place])    Right arm   Abdominal hernia    AKI (acute kidney injury) ([place]) 01/2015   Anasarca    Anemia    Asthma    as a child   Bilateral inguinal hernia    Bilateral pleural effusion 01/2017   Cellulitis 01/16/2017   Bilateral lower extremity   CHF (congestive heart failure) ([place])    Chronic venous insufficiency    Concentric left ventricular hypertrophy 08/17/2017   Moderated, noted on ECHO   Diastolic dysfunction 84/13/2440   noted on ECHO   Elevated LFTs    per patient, resolved   End stage renal disease [facility])    Dialysis T/Th/Sa  Started 02/2017/pt is waiting for a kidney transplant   H/O right inguinal hernia repair 11/2017   History of colonoscopy 03/20/2018   History of elevated PSA 10/2017   Hypertension    Pneumonia    Pulmonary hypertension (  [place])    Moderate   Venous stasis ulcer ([place])    bil legs/ healed up now per pt (03/06/18)    Past Surgical History:  Procedure Laterality Date   AV FISTULA PLACEMENT Right 01/20/2017   Procedure: ARTERIOVENOUS (AV) FISTULA CREATION;  Surgeon: Angelia Mould, MD;  Location: [place];  Service: Vascular;  Laterality: Right;   HERNIA REPAIR     INSERTION OF  DIALYSIS CATHETER Right 01/20/2017   Procedure: INSERTION OF DIALYSIS CATHETER;  Surgeon: Angelia Mould, MD;  Location: [place];  Service: Vascular;  Laterality: Right;   INSERTION OF MESH N/A 11/18/2017   Procedure: INSERTION OF MESH;  Surgeon: Michael Boston, MD;  Location: [facility];  Service: General;  Laterality: N/A;   IR FLUORO GUIDE CV LINE RIGHT  01/18/2017   IR LUMBAR DISC ASPIRATION W/IMG GUIDE  07/21/2020   IR US GUIDE VASC ACCESS RIGHT  01/18/2017   LAPAROSCOPIC INGUINAL HERNIA WITH UMBILICAL HERNIA Bilateral 11/18/2017   Procedure: LAPAROSCOPIC BILATERAL INGUINAL HERNIA REPAIR WITH UMBILICAL HERNIA REPAIR WITH MESH;  Surgeon: Michael Boston, MD;  Location: [facility];  Service: General;  Laterality: Bilateral;   TOE SURGERY     right fracture in big toe   TONSILLECTOMY       reports that he quit smoking about 26 years ago. He has never used smokeless tobacco. He reports that he does not currently use alcohol. He reports that he does not currently use drugs.  Allergies  Allergen Reactions   Lisinopril Cough    Family History  Problem Relation Age of Onset   Heart attack Mother 55       CABG hx   Heart disease Mother    Hypertension Mother    Stroke Mother    Diabetes Father    Healthy Brother    Prostate cancer Maternal Grandfather    Colon cancer Neg Hx    Colon polyps Neg Hx    Esophageal cancer Neg Hx    Stomach cancer Neg Hx    Rectal cancer Neg Hx      Prior to Admission medications   Medication Sig Start Date End Date Taking? Authorizing Provider  amLODipine (NORVASC) 10 MG tablet Take 10 mg by mouth every morning. 06/20/18  Yes [provider]  carvedilol (COREG) 25 MG tablet Take 1 tablet (25 mg total) by mouth 2 (two) times daily with a meal. 02/02/18  Yes [name], FNP  cephALEXin (KEFLEX) 250 MG capsule Take 1 capsule (250 mg total) by mouth 2 (two) times daily for 10 days. On dialysis days, take after dialysis 02/08/21 02/18/21 Yes Caccavale,  Sophia, PA-C  hydrALAZINE (APRESOLINE) 100 MG tablet Take 1 tablet (100 mg total) by mouth 3 (three) times daily. Patient taking differently: Take 100 mg by mouth 2 (two) times daily. 11/03/19 02/09/21 Yes Pahwani, Einar Grad, MD  methocarbamol (ROBAXIN) 500 MG tablet Take 1 tablet (500 mg total) by mouth 2 (two) times daily as needed for muscle spasms. 02/08/21  Yes Caccavale, Sophia, PA-C  multivitamin (RENA-VIT) TABS tablet Take 1 tablet by mouth every morning. 10/20/17  Yes [provider]  polyvinyl alcohol (LIQUIFILM TEARS) 1.4 % ophthalmic solution Place 1 drop into both eyes daily as needed for dry eyes.   Yes [provider]  doxercalciferol (HECTOROL) 0.5 MCG capsule At dialysis 02/21/20 02/19/21  [provider]  iron sucrose in sodium chloride 0.9 % 100 mL Iron Sucrose (Venofer) 02/26/20 02/17/21  [provider]  Methoxy PEG-Epoetin  Beta (MIRCERA IJ) At dialysis 02/12/20 02/10/21  [provider]    Physical Exam: Vitals:   02/09/21 0054 02/09/21 0121 02/09/21 0232 02/09/21 0242  BP: (!) 190/102 (!) 182/106 (!) 178/102   Pulse: 64 66 71 71  Resp:  (!) 22 (!) 22   Temp:  98 F (36.7 C) 98.6 F (37 C)   TempSrc:      SpO2: 96% 95% 98% 100%  Weight:      Height:        Constitutional: Awake alert and oriented x3, no associated distress.   Skin: no rashes, no lesions, good skin turgor noted. Eyes: Pupils are equally reactive to light.  No evidence of scleral icterus or conjunctival pallor.  ENMT: Moist mucous membranes noted.  Posterior pharynx clear of any exudate or lesions.   Neck: normal, supple, no masses, no thyromegaly.  No evidence of jugular venous distension.   Respiratory: Notable bibasilar rales with evidence of intermittent mild expiratory wheezing.  Normal respiratory effort. No accessory muscle use.  Cardiovascular: Regular rate and rhythm, no murmurs / rubs / gallops. No extremity edema. 2+ pedal pulses. No carotid bruits.  Chest:    Nontender without crepitus or deformity.   Back:   Nontender without crepitus or deformity. Abdomen: Abdomen is soft and nontender.  No evidence of intra-abdominal masses.  Positive bowel sounds noted in all quadrants.   Musculoskeletal: Large left olecranial bursitis noted with mild erythema, warmth and minimal tenderness.  No other deformities noted.  Good ROM, no contractures. Normal muscle tone.  Neurologic: CN 2-12 grossly intact. Sensation intact.  Patient moving all 4 extremities spontaneously.  Patient is following all commands.  Patient is responsive to verbal stimuli.   Psychiatric: Patient exhibits normal mood with appropriate affect.  Patient seems to possess insight as to their current situation.     Labs on Admission: I have personally reviewed following labs and imaging studies -   CBC: Recent Labs  Lab 02/08/21 1310  WBC 5.1  NEUTROABS 3.8  HGB 10.4*  HCT 32.1*  MCV 94.7  PLT 295   Basic Metabolic Panel: Recent Labs  Lab 02/08/21 1310  NA 134*  K 5.8*  CL 96*  CO2 24  GLUCOSE 112*  BUN 112*  CREATININE 13.99*  CALCIUM 9.0   GFR: Estimated Creatinine Clearance: 6.2 mL/min (A) (by C-G formula based on SCr of 13.99 mg/dL (H)). Liver Function Tests: Recent Labs  Lab 02/08/21 1310  AST 14*  ALT 21  ALKPHOS 41  BILITOT 0.5  PROT 6.2*  ALBUMIN 3.3*   No results for input(s): LIPASE, AMYLASE in the last 168 hours. No results for input(s): AMMONIA in the last 168 hours. Coagulation Profile: No results for input(s): INR, PROTIME in the last 168 hours. Cardiac Enzymes: No results for input(s): CKTOTAL, CKMB, CKMBINDEX, TROPONINI in the last 168 hours. BNP (last 3 results) No results for input(s): PROBNP in the last 8760 hours. HbA1C: No results for input(s): HGBA1C in the last 72 hours. CBG: No results for input(s): GLUCAP in the last 168 hours. Lipid Profile: No results for input(s): CHOL, HDL, LDLCALC, TRIG, CHOLHDL, LDLDIRECT in the last 72  hours. Thyroid Function Tests: No results for input(s): TSH, T4TOTAL, FREET4, T3FREE, THYROIDAB in the last 72 hours. Anemia Panel: No results for input(s): VITAMINB12, FOLATE, FERRITIN, TIBC, IRON, RETICCTPCT in the last 72 hours. Urine analysis:    Component Value Date/Time   COLORURINE YELLOW 10/30/2019 2335   APPEARANCEUR HAZY (A) 10/30/2019 2335  LABSPEC 1.013 10/30/2019 2335   PHURINE 7.0 10/30/2019 2335   GLUCOSEU 50 (A) 10/30/2019 2335   HGBUR NEGATIVE 10/30/2019 [place] 10/30/2019 2335   BILIRUBINUR Negative 03/19/2019 1354   KETONESUR 5 (A) 10/30/2019 2335   PROTEINUR >=300 (A) 10/30/2019 2335   UROBILINOGEN 0.2 03/19/2019 1354   UROBILINOGEN 0.2 02/26/2015 1028   NITRITE NEGATIVE 10/30/2019 2335   LEUKOCYTESUR NEGATIVE 10/30/2019 2335    Radiological Exams on Admission - Personally Reviewed: DG Chest 2 View  Result Date: 02/08/2021 CLINICAL DATA:  Hypoxia EXAM: CHEST - 2 VIEW COMPARISON:  01/29/2021 FINDINGS: Cardiomegaly. No frank interstitial edema. Mild left basilar atelectasis. No pleural effusion or pneumothorax. Visualized osseous structures are within normal limits. IMPRESSION: Cardiomegaly.  No frank interstitial edema. Electronically Signed   By: Julian Hy M.D.   On: 02/08/2021 20:29   MR Lumbar Spine W Wo Contrast  Result Date: 02/08/2021 CLINICAL DATA:  Low back pain, history of discitis and current infected bursa EXAM: MRI LUMBAR SPINE WITHOUT AND WITH CONTRAST TECHNIQUE: Multiplanar and multiecho pulse sequences of the lumbar spine were obtained without and with intravenous contrast. CONTRAST:  41mL GADAVIST GADOBUTROL 1 MMOL/ML IV SOLN COMPARISON:  Lumbar spine MRI 07/18/2020 FINDINGS: Segmentation: Standard; the lowest formed disc space is designated L5-S1. Alignment:  Normal. Vertebrae: Vertebral body heights are preserved. There is mild T2/STIR hyperintensity along the L5-S1 endplates adjacent to the disc with mild enhancement.  The marrow edema is decreased compared to the study from 07/18/2020. There is no evidence of septic arthritis. There is no evidence of epidural fluid collection. There is minimal degenerative endplate marrow signal abnormality antero inferiorly at L1 and L2. There is no other abnormal marrow edema or enhancement. There is no suspicious marrow signal abnormality. Conus medullaris and cauda equina: Conus extends to the L1 level. Conus and cauda equina appear normal. There is no abnormal enhancement of the cauda equina nerve roots. Paraspinal and other soft tissues: There is mild perifacetal soft tissue edema at L4-L5 and L5-S1. The kidneys are atrophic. A nonenhancing exophytic lesion arising from the left kidney likely reflects a cyst. There is mild edema in the soft tissues along the anterior L5 and S1 endplates, similar to the prior study from 07/18/2020. Disc levels: The disc spaces of the and L5-S1 are overall preserved. T12-L1: No significant spinal canal or neural foraminal stenosis. L1-L2: No significant spinal canal or neural foraminal stenosis L2-L3: No significant spinal canal or neural foraminal stenosis L3-L4: No significant spinal canal or neural foraminal stenosis L4-L5: There is mild left worse than right facet arthropathy with trace effusions without significant spinal canal or neural foraminal stenosis. L5-S1: There is intervertebral disc space narrowing with a disc bulge eccentric to the left with bilateral foraminal components, and moderate left worse than right facet arthropathy with small bilateral pleural effusions resulting in moderate bilateral neural foraminal stenosis without significant spinal canal stenosis. IMPRESSION: 1. Marrow edema at L5-S1 has significantly decreased since 07/18/2020. There is mild enhancement along the disc space without evidence of progressed osseous destruction, no evidence of septic arthritis, and no evidence of epidural fluid collection. Findings are not  convincing for discitis/osteomyelitis, though indolent infection would be difficult to entirely exclude. 2. Facet arthropathy at L4-L5 and L5-S1, worse at L5-S1, with trace effusions and mild perifacetal soft tissue edema. As above, no evidence of septic arthritis. 3. Moderate bilateral neural foraminal stenosis at L5-S1. No other significant spinal canal or neural foraminal stenosis. Electronically Signed  By: Valetta Mole M.D.   On: 02/08/2021 17:03    EKG: Personally reviewed.  Rhythm is Normal sinus rhythm with heart rate of 71 bpm.  Incomplete right bundle branch block with T wave inversions in the inferior leads.  Assessment/Plan  * Acute on chronic diastolic CHF (congestive heart failure) ([place])- (present on admission) Patient presenting with symptoms of lethargy, shortness of breath and bouts of hypoxia concerning for volume overload with acute diastolic congestive heart failure due to missed dialysis Due to end-stage renal disease, modality of therapy will need to be dialysis We will consult nephrology in the morning for initiation of dialysis while hospitalized Strict input and output monitoring Supplemental oxygen for bouts of hypoxia Monitoring renal function and electrolytes with serial chemistires Daily weights    Hypoxia- (present on admission) Patient exhibiting bouts of mild hypoxia in the emergency department requiring initiation of supplemental oxygen Remainder of assessment and plan as above  Infection of left olecranon bursa- (present on admission) Diagnosis of bursitis during emergency department evaluation on 1/20 Patient underwent aspiration of the left olecranon bursa and was initially sent home without antibiotics Cultures later grew out pansensitive Staph aureus and patient was instructed to come to the emergency department Will treat patient with intravenous vancomycin while here Patient can be quickly de-escalated and transitioned to oral antibiotic therapy at  time of discharge  Hypertensive urgency- (present on admission) Patient presenting with markedly elevated blood pressures likely secondary to volume overload due to missed dialysis Consulting nephrology in the morning for dialysis We will resume home antihypertensive regimen As needed intravenous antihypertensives for markedly elevated blood pressure  ESRD (end stage renal disease) on dialysis [facility]) Consulting nephrology for consideration of dialysis this morning to address volume overload, hyperkalemia and hypertensive urgency   Hyperkalemia- (present on admission) Secondary to missed dialysis No concurrent EKG changes Lokelma administered in the emergency department Nephrology will be consulted in the morning for dialysis  Low back pain- (present on admission) Patient presenting with low back pain Patient has a pertinent past medical history of discitis of the lumbar spine in July 2022 treated with a course of antibiotic therapy Cultures at that time did not grow out an organism MRI performed on this presentation is not entirely compelling for recurrent discitis or osteomyelitis Monitor closely      Code Status:  Full code  code status decision has been confirmed with: patient Family Communication: deferred   Status is: Observation  The patient remains OBS appropriate and will d/c before 2 midnights.       Vernelle Emerald MD [facility] Pager [phone]  If 7PM-7AM, please contact night-coverage [email or web address] Use universal [place] password for that web site. If you do not have the password, please call the hospital operator.  02/09/2021, 5:17 AM

## 2021-02-09 NOTE — Procedures (Signed)
Asked to provide hemodialysis for ultrafiltration/volume unloading on Jason [name] (a TTS ESRD patient who missed his dialysis on Saturday 02/07/21 due to elbow pain). He presented to ER for what appears to be left elbow bursitis but developed hypoxia and shortness of breath- the plan is to try and improve his volume status with HD and consider DC home if there is no additional indication for admission. He is on an observational status.   Patient seen on Hemodialysis. BP (!) 161/95    Pulse 65    Temp 98.6 F (37 C) (Oral)    Resp 16    Ht 6\' 2"  (1.88 m)    Wt 77.1 kg    SpO2 91%    BMI 21.83 kg/m   QB 400, UF goal 4.5L Tolerating treatment without complaints at this time and reports some improvement of his SOB.   Jason Shiley MD [facility]. Office # [phone] Pager # [phone] 12:48 PM

## 2021-02-09 NOTE — Assessment & Plan Note (Signed)
·   Secondary to missed dialysis  No concurrent EKG changes  Lokelma administered in the emergency department  Nephrology will be consulted in the morning for dialysis

## 2021-02-09 NOTE — Assessment & Plan Note (Signed)
·   Patient presenting with markedly elevated blood pressures likely secondary to volume overload due to missed dialysis  Consulting nephrology in the morning for dialysis  We will resume home antihypertensive regimen  As needed intravenous antihypertensives for markedly elevated blood pressure

## 2021-02-09 NOTE — Discharge Summary (Addendum)
Physician Discharge Summary  Jason [name]. ULA:[id number] DOB: [birth year] DOA: 02/08/2021  PCP: Pcp, No  Admit date: 02/08/2021 Discharge date: 02/09/2021  Admitted From: Home. Disposition: Home  Recommendations for Outpatient Follow-up:  Go to your routine dialysis tomorrow  Home Health: N/A Equipment/Devices: N/A  Discharge Condition: Fair CODE STATUS: Full code Diet recommendation: Low-salt diet  Discharge summary: 61 year old gentleman with history of ESRD on hemodialysis TTS schedule, hypertension, recently aspirated left olecranon bursa presented to the ER with back pain and missed hemodialysis on 1/28 due to back pain. He was in the emergency room on 1/20 with painful swelling behind the left elbow, aspirated and discharged home.  Aspiration was likely inflammatory, however it also grew rare MSSA bacteria.  His elbow is already improving and swelling is painless.  With history of hemodialysis, worsening back pain with history of chronic lumbar back pain, MRI of the lumbar spine was done that revealed mild enhancement and disc bulge L5/S1 with no evidence of discitis or osteomyelitis.  Patient was preparing to be discharged home, he complained of shortness of breath.  Hospitalist was consulted for admission to the hospital because patient needed hemodialysis before discharging home as well as he had missed hemodialysis.  Missed hemodialysis with hyperkalemia and hypoxemia: Already improving.  He will receive dialysis today.  Subsequent dialysis tomorrow as outpatient.  Discussed with nephrology.  Stable after dialysis today.  Infected left olecranon bursa: Asymptomatic today.  Received 1 dose of vancomycin with hemodialysis.  Rare MSSA on aspirate.  Blood cultures negative.  Doxycycline for 7 days.  Essential hypertension: Resume home medications.  Patient does have another dialysis is scheduled tomorrow as outpatient.  He is medically stable to discharge home.   Discharge  Diagnoses:  Principal Problem:   Acute on chronic diastolic CHF (congestive heart failure) (HCC) Active Problems:   ESRD (end stage renal disease) on dialysis (HCC)   Hypertensive urgency   Hyperkalemia   Hypoxia   Infection of left olecranon bursa   Low back pain    Discharge Instructions  Discharge Instructions     Call MD for:  severe uncontrolled pain   Complete by: As directed    Call MD for:  temperature >100.4   Complete by: As directed    Diet - low sodium heart healthy   Complete by: As directed    Discharge instructions   Complete by: As directed    Go to your dialysis center tomorrow as previously scheduled.   Increase activity slowly   Complete by: As directed    No wound care   Complete by: As directed       Allergies as of 02/09/2021       Reactions   Lisinopril Cough        Medication List     TAKE these medications    amLODipine 10 MG tablet Commonly known as: NORVASC Take 10 mg by mouth every morning.   carvedilol 25 MG tablet Commonly known as: COREG Take 1 tablet (25 mg total) by mouth 2 (two) times daily with a meal.   doxercalciferol 0.5 MCG capsule Commonly known as: HECTOROL At dialysis   doxycycline 50 MG capsule Commonly known as: VIBRAMYCIN Take 1 capsule (50 mg total) by mouth 2 (two) times daily for 7 days.   hydrALAZINE 100 MG tablet Commonly known as: APRESOLINE Take 1 tablet (100 mg total) by mouth 3 (three) times daily. What changed: when to take this   iron sucrose in sodium chloride  0.9 % 100 mL Iron Sucrose (Venofer)   methocarbamol 500 MG tablet Commonly known as: ROBAXIN Take 1 tablet (500 mg total) by mouth 2 (two) times daily as needed for muscle spasms. What changed: when to take this   MIRCERA IJ At dialysis   multivitamin Tabs tablet Take 1 tablet by mouth every morning.   polyvinyl alcohol 1.4 % ophthalmic solution Commonly known as: LIQUIFILM TEARS Place 1 drop into both eyes daily as needed  for dry eyes.        Follow-up Information     your dialysis center .   Why: Call tomorrow to have dialysis due to missed session yesterday and slightly elevated potassium today        Pcp, No. Schedule an appointment as soon as possible for a visit .   Why: For further evaluation and management of your back pain.  For reevaluation of your elbow.               Allergies  Allergen Reactions   Lisinopril Cough    Consultations: Nephrology   Procedures/Studies: DG Chest 2 View  Result Date: 02/08/2021 CLINICAL DATA:  Hypoxia EXAM: CHEST - 2 VIEW COMPARISON:  01/29/2021 FINDINGS: Cardiomegaly. No frank interstitial edema. Mild left basilar atelectasis. No pleural effusion or pneumothorax. Visualized osseous structures are within normal limits. IMPRESSION: Cardiomegaly.  No frank interstitial edema. Electronically Signed   By: Julian Hy M.D.   On: 02/08/2021 20:29   DG Chest 2 View  Result Date: 01/29/2021 CLINICAL DATA:  Peripheral swelling EXAM: CHEST - 2 VIEW COMPARISON:  08/31/2020 FINDINGS: Cardiac shadow is prominent. Aortic calcifications are seen. Mild vascular congestion is noted likely related to the recently missed dialysis session. No sizable infiltrate or effusion is noted. No bony abnormality is seen. IMPRESSION: Mild vascular congestion likely related to fluid overload. Electronically Signed   By: Inez Catalina M.D.   On: 01/29/2021 23:14   DG Elbow Complete Left  Result Date: 01/29/2021 CLINICAL DATA:  Posterior elbow swelling, initial encounter EXAM: LEFT ELBOW - COMPLETE 3+ VIEW COMPARISON:  None. FINDINGS: Olecranon spurring is noted. Considerable soft tissue swelling over the olecranon is seen consistent with olecranon bursitis. No foreign body is noted. Mild degenerative changes of the elbow joint are seen. No joint effusion is noted. No fracture is seen. IMPRESSION: Changes of olecranon bursitis. Mild degenerative changes of the elbow joint are  seen. No acute bony abnormality is noted. Electronically Signed   By: Inez Catalina M.D.   On: 01/29/2021 23:12   MR Lumbar Spine W Wo Contrast  Result Date: 02/08/2021 CLINICAL DATA:  Low back pain, history of discitis and current infected bursa EXAM: MRI LUMBAR SPINE WITHOUT AND WITH CONTRAST TECHNIQUE: Multiplanar and multiecho pulse sequences of the lumbar spine were obtained without and with intravenous contrast. CONTRAST:  10mL GADAVIST GADOBUTROL 1 MMOL/ML IV SOLN COMPARISON:  Lumbar spine MRI 07/18/2020 FINDINGS: Segmentation: Standard; the lowest formed disc space is designated L5-S1. Alignment:  Normal. Vertebrae: Vertebral body heights are preserved. There is mild T2/STIR hyperintensity along the L5-S1 endplates adjacent to the disc with mild enhancement. The marrow edema is decreased compared to the study from 07/18/2020. There is no evidence of septic arthritis. There is no evidence of epidural fluid collection. There is minimal degenerative endplate marrow signal abnormality antero inferiorly at L1 and L2. There is no other abnormal marrow edema or enhancement. There is no suspicious marrow signal abnormality. Conus medullaris and cauda equina: Conus extends to the  L1 level. Conus and cauda equina appear normal. There is no abnormal enhancement of the cauda equina nerve roots. Paraspinal and other soft tissues: There is mild perifacetal soft tissue edema at L4-L5 and L5-S1. The kidneys are atrophic. A nonenhancing exophytic lesion arising from the left kidney likely reflects a cyst. There is mild edema in the soft tissues along the anterior L5 and S1 endplates, similar to the prior study from 07/18/2020. Disc levels: The disc spaces of the and L5-S1 are overall preserved. T12-L1: No significant spinal canal or neural foraminal stenosis. L1-L2: No significant spinal canal or neural foraminal stenosis L2-L3: No significant spinal canal or neural foraminal stenosis L3-L4: No significant spinal canal or  neural foraminal stenosis L4-L5: There is mild left worse than right facet arthropathy with trace effusions without significant spinal canal or neural foraminal stenosis. L5-S1: There is intervertebral disc space narrowing with a disc bulge eccentric to the left with bilateral foraminal components, and moderate left worse than right facet arthropathy with small bilateral pleural effusions resulting in moderate bilateral neural foraminal stenosis without significant spinal canal stenosis. IMPRESSION: 1. Marrow edema at L5-S1 has significantly decreased since 07/18/2020. There is mild enhancement along the disc space without evidence of progressed osseous destruction, no evidence of septic arthritis, and no evidence of epidural fluid collection. Findings are not convincing for discitis/osteomyelitis, though indolent infection would be difficult to entirely exclude. 2. Facet arthropathy at L4-L5 and L5-S1, worse at L5-S1, with trace effusions and mild perifacetal soft tissue edema. As above, no evidence of septic arthritis. 3. Moderate bilateral neural foraminal stenosis at L5-S1. No other significant spinal canal or neural foraminal stenosis. Electronically Signed   By: Valetta Mole M.D.   On: 02/08/2021 17:03   (Echo, Carotid, EGD, Colonoscopy, ERCP)    Subjective: Patient seen and examined.  He was in the emergency room.  He was admitted early morning hours by nighttime hospitalist because he needed hemodialysis in the hospital before discharge.  I discussed with patient about discharge planning, he ambulated around in the hallway without back pain.  He tells me that his elbow swelling is much improved since last 10 days and now it is painless.  Patient is agreeable to go home after hemodialysis today.   Discharge Exam: Vitals:   02/09/21 1222 02/09/21 1300  BP: (!) 170/99 (!) 155/101  Pulse: 65 67  Resp: 17 18  Temp:    SpO2: 95% 96%   Vitals:   02/09/21 1050 02/09/21 1152 02/09/21 1222 02/09/21  1300  BP: (!) 143/86 (!) 161/95 (!) 170/99 (!) 155/101  Pulse: 61 65 65 67  Resp: 16 16 17 18   Temp:      TempSrc:      SpO2: 90% 91% 95% 96%  Weight:      Height:        General: Pt is alert, awake, not in acute distress Currently on room air.  Walking around in the hallway. Cardiovascular: RRR, S1/S2 +, no rubs, no gallops Respiratory: CTA bilaterally, no wheezing, no rhonchi Abdominal: Soft, NT, ND, bowel sounds + Extremities:  Patient has a fluctuant swelling posterior to the left elbow.  Elbow joint is not involved.  Some tenderness on mobility without erythema or redness.    The results of significant diagnostics from this hospitalization (including imaging, microbiology, ancillary and laboratory) are listed below for reference.     Microbiology: Recent Results (from the past 240 hour(s))  Blood culture (routine x 2)     Status:  None (Preliminary result)   Collection Time: 02/08/21  1:10 PM   Specimen: BLOOD  Result Value Ref Range Status   Specimen Description BLOOD LEFT ANTECUBITAL  Final   Special Requests   Final    BOTTLES DRAWN AEROBIC AND ANAEROBIC Blood Culture results may not be optimal due to an excessive volume of blood received in culture bottles   Culture   Final    NO GROWTH < 24 HOURS Performed at [facility] [address] [place], [place] [address]    Report Status PENDING  Incomplete  Blood culture (routine x 2)     Status: None (Preliminary result)   Collection Time: 02/08/21  1:15 PM   Specimen: BLOOD RIGHT FOREARM  Result Value Ref Range Status   Specimen Description BLOOD RIGHT FOREARM  Final   Special Requests   Final    BOTTLES DRAWN AEROBIC AND ANAEROBIC Blood Culture results may not be optimal due to an excessive volume of blood received in culture bottles   Culture   Final    NO GROWTH < 24 HOURS Performed at [facility], [place] [address], [place], [place] [address]    Report Status PENDING  Incomplete  Resp Panel by  RT-PCR (Flu A&B, Covid) Nasopharyngeal Swab     Status: None   Collection Time: 02/09/21 12:48 AM   Specimen: Nasopharyngeal Swab; Nasopharyngeal(NP) swabs in vial transport medium  Result Value Ref Range Status   SARS Coronavirus 2 by RT PCR NEGATIVE NEGATIVE Final    Comment: (NOTE) SARS-CoV-2 target nucleic acids are NOT DETECTED.  The SARS-CoV-2 RNA is generally detectable in upper respiratory specimens during the acute phase of infection. The lowest concentration of SARS-CoV-2 viral copies this assay can detect is 138 copies/mL. A negative result does not preclude SARS-Cov-2 infection and should not be used as the sole basis for treatment or other patient management decisions. A negative result may occur with  improper specimen collection/handling, submission of specimen other than nasopharyngeal swab, presence of viral mutation(s) within the areas targeted by this assay, and inadequate number of viral copies(<138 copies/mL). A negative result must be combined with clinical observations, patient history, and epidemiological information. The expected result is Negative.  Fact Sheet for Patients:  EntrepreneurPulse.com.au  Fact Sheet for Healthcare Providers:  IncredibleEmployment.be  This test is no t yet approved or cleared by the [place] FDA and  has been authorized for detection and/or diagnosis of SARS-CoV-2 by FDA under an Emergency Use Authorization (EUA). This EUA will remain  in effect (meaning this test can be used) for the duration of the COVID-19 declaration under Section 564(b)(1) of the Act, 21 U.S.C.section 360bbb-3(b)(1), unless the authorization is terminated  or revoked sooner.       Influenza A by PCR NEGATIVE NEGATIVE Final   Influenza B by PCR NEGATIVE NEGATIVE Final    Comment: (NOTE) The Xpert Xpress SARS-CoV-2/FLU/RSV plus assay is intended as an aid in the diagnosis of influenza from Nasopharyngeal swab  specimens and should not be used as a sole basis for treatment. Nasal washings and aspirates are unacceptable for Xpert Xpress SARS-CoV-2/FLU/RSV testing.  Fact Sheet for Patients: EntrepreneurPulse.com.au  Fact Sheet for Healthcare Providers: IncredibleEmployment.be  This test is not yet approved or cleared by the [place] FDA and has been authorized for detection and/or diagnosis of SARS-CoV-2 by FDA under an Emergency Use Authorization (EUA). This EUA will remain in effect (meaning this test can be used) for the duration of  the COVID-19 declaration under Section 564(b)(1) of the Act, 21 U.S.C. section 360bbb-3(b)(1), unless the authorization is terminated or revoked.  Performed at [facility], [place] [address], [place], [place] [address]      Labs: BNP (last 3 results) Recent Labs    08/02/20 2200 08/30/20 1935 02/09/21 0614  BNP 1,046.1* 3,564.1* 7,628.3*   Basic Metabolic Panel: Recent Labs  Lab 02/08/21 1310 02/09/21 0613  NA 134* 136  K 5.8* 5.6*  CL 96* 97*  CO2 24 23  GLUCOSE 112* 98  BUN 112* 115*  CREATININE 13.99* 14.91*  CALCIUM 9.0 8.7*  MG  --  2.8*   Liver Function Tests: Recent Labs  Lab 02/08/21 1310 02/09/21 0613  AST 14* 15  ALT 21 19  ALKPHOS 41 38  BILITOT 0.5 0.4  PROT 6.2* 6.2*  ALBUMIN 3.3* 3.1*   No results for input(s): LIPASE, AMYLASE in the last 168 hours. No results for input(s): AMMONIA in the last 168 hours. CBC: Recent Labs  Lab 02/08/21 1310 02/09/21 0613  WBC 5.1 5.4  NEUTROABS 3.8 3.4  HGB 10.4* 10.2*  HCT 32.1* 31.2*  MCV 94.7 95.1  PLT 202 204   Cardiac Enzymes: No results for input(s): CKTOTAL, CKMB, CKMBINDEX, TROPONINI in the last 168 hours. BNP: Invalid input(s): POCBNP CBG: No results for input(s): GLUCAP in the last 168 hours. D-Dimer No results for input(s): DDIMER in the last 72 hours. Hgb A1c No results for input(s): HGBA1C in the last 72  hours. Lipid Profile No results for input(s): CHOL, HDL, LDLCALC, TRIG, CHOLHDL, LDLDIRECT in the last 72 hours. Thyroid function studies No results for input(s): TSH, T4TOTAL, T3FREE, THYROIDAB in the last 72 hours.  Invalid input(s): FREET3 Anemia work up No results for input(s): VITAMINB12, FOLATE, FERRITIN, TIBC, IRON, RETICCTPCT in the last 72 hours. Urinalysis    Component Value Date/Time   COLORURINE YELLOW 10/30/2019 2335   APPEARANCEUR HAZY (A) 10/30/2019 2335   LABSPEC 1.013 10/30/2019 2335   PHURINE 7.0 10/30/2019 2335   GLUCOSEU 50 (A) 10/30/2019 2335   HGBUR NEGATIVE 10/30/2019 2335   BILIRUBINUR NEGATIVE 10/30/2019 2335   BILIRUBINUR Negative 03/19/2019 1354   KETONESUR 5 (A) 10/30/2019 2335   PROTEINUR >=300 (A) 10/30/2019 2335   UROBILINOGEN 0.2 03/19/2019 1354   UROBILINOGEN 0.2 02/26/2015 1028   NITRITE NEGATIVE 10/30/2019 2335   LEUKOCYTESUR NEGATIVE 10/30/2019 2335   Sepsis Labs Invalid input(s): PROCALCITONIN,  WBC,  LACTICIDVEN Microbiology Recent Results (from the past 240 hour(s))  Blood culture (routine x 2)     Status: None (Preliminary result)   Collection Time: 02/08/21  1:10 PM   Specimen: BLOOD  Result Value Ref Range Status   Specimen Description BLOOD LEFT ANTECUBITAL  Final   Special Requests   Final    BOTTLES DRAWN AEROBIC AND ANAEROBIC Blood Culture results may not be optimal due to an excessive volume of blood received in culture bottles   Culture   Final    NO GROWTH < 24 HOURS Performed at [facility], [place] [address], [place], [place] [address]    Report Status PENDING  Incomplete  Blood culture (routine x 2)     Status: None (Preliminary result)   Collection Time: 02/08/21  1:15 PM   Specimen: BLOOD RIGHT FOREARM  Result Value Ref Range Status   Specimen Description BLOOD RIGHT FOREARM  Final   Special Requests   Final    BOTTLES DRAWN AEROBIC AND ANAEROBIC Blood Culture results may  not be optimal due to an excessive  volume of blood received in culture bottles   Culture   Final    NO GROWTH < 24 HOURS Performed at [facility] [address] [place], [place] [address]    Report Status PENDING  Incomplete  Resp Panel by RT-PCR (Flu A&B, Covid) Nasopharyngeal Swab     Status: None   Collection Time: 02/09/21 12:48 AM   Specimen: Nasopharyngeal Swab; Nasopharyngeal(NP) swabs in vial transport medium  Result Value Ref Range Status   SARS Coronavirus 2 by RT PCR NEGATIVE NEGATIVE Final    Comment: (NOTE) SARS-CoV-2 target nucleic acids are NOT DETECTED.  The SARS-CoV-2 RNA is generally detectable in upper respiratory specimens during the acute phase of infection. The lowest concentration of SARS-CoV-2 viral copies this assay can detect is 138 copies/mL. A negative result does not preclude SARS-Cov-2 infection and should not be used as the sole basis for treatment or other patient management decisions. A negative result may occur with  improper specimen collection/handling, submission of specimen other than nasopharyngeal swab, presence of viral mutation(s) within the areas targeted by this assay, and inadequate number of viral copies(<138 copies/mL). A negative result must be combined with clinical observations, patient history, and epidemiological information. The expected result is Negative.  Fact Sheet for Patients:  EntrepreneurPulse.com.au  Fact Sheet for Healthcare Providers:  IncredibleEmployment.be  This test is no t yet approved or cleared by the [place] FDA and  has been authorized for detection and/or diagnosis of SARS-CoV-2 by FDA under an Emergency Use Authorization (EUA). This EUA will remain  in effect (meaning this test can be used) for the duration of the COVID-19 declaration under Section 564(b)(1) of the Act, 21 U.S.C.section 360bbb-3(b)(1), unless the authorization is terminated  or revoked sooner.       Influenza A by PCR  NEGATIVE NEGATIVE Final   Influenza B by PCR NEGATIVE NEGATIVE Final    Comment: (NOTE) The Xpert Xpress SARS-CoV-2/FLU/RSV plus assay is intended as an aid in the diagnosis of influenza from Nasopharyngeal swab specimens and should not be used as a sole basis for treatment. Nasal washings and aspirates are unacceptable for Xpert Xpress SARS-CoV-2/FLU/RSV testing.  Fact Sheet for Patients: EntrepreneurPulse.com.au  Fact Sheet for Healthcare Providers: IncredibleEmployment.be  This test is not yet approved or cleared by the [place] FDA and has been authorized for detection and/or diagnosis of SARS-CoV-2 by FDA under an Emergency Use Authorization (EUA). This EUA will remain in effect (meaning this test can be used) for the duration of the COVID-19 declaration under Section 564(b)(1) of the Act, 21 U.S.C. section 360bbb-3(b)(1), unless the authorization is terminated or revoked.  Performed at [facility], [place] [address] [place], [place] [address]      Time coordinating discharge:  25 minutes  SIGNED:   Barb Merino, MD  [facility] 02/09/2021, 1:39 PM

## 2021-02-09 NOTE — ED Notes (Signed)
Patient transported to dialysis

## 2021-02-09 NOTE — Progress Notes (Signed)
Pharmacy Antibiotic Note  Jason [name]. is a 61 y.o. male admitted on 02/08/2021 with back pain, recent elbow aspiration growing MSSA .  Pharmacy has been consulted for Vancomycin dosing.  Patient missed HD on 1/28 due to back pain. Patient received vancomycin 1500mg  x1 this morning then received off-schedule dialysis (tolerating 400 BFR x4 hrs). Will give post-HD dose before discharge. HD RN aware. Planning transition to doxycycline after today per team.   Plan: Vancomycin 750 mg IV x1 after HD  Discharge 1/31 with doxycycline per team   Height: 6\' 2"  (188 cm) Weight: 77.1 kg (169 lb 15.6 oz) IBW/kg (Calculated) : 82.2  Temp (24hrs), Avg:98.5 F (36.9 C), Min:98 F (36.7 C), Max:98.7 F (37.1 C)  Recent Labs  Lab 02/08/21 1310 02/09/21 0613  WBC 5.1 5.4  CREATININE 13.99* 14.91*     Estimated Creatinine Clearance: 5.8 mL/min (A) (by C-G formula based on SCr of 14.91 mg/dL (H)).    Allergies  Allergen Reactions   Lisinopril Cough   Benetta Spar, PharmD, BCPS, [facility] Clinical Pharmacist  Please check AMION for all [place] phone numbers After 10:00 PM, call [place] [phone]

## 2021-02-10 DIAGNOSIS — N186 End stage renal disease: Secondary | ICD-10-CM | POA: Diagnosis not present

## 2021-02-10 DIAGNOSIS — Z992 Dependence on renal dialysis: Secondary | ICD-10-CM | POA: Diagnosis not present

## 2021-02-10 DIAGNOSIS — I129 Hypertensive chronic kidney disease with stage 1 through stage 4 chronic kidney disease, or unspecified chronic kidney disease: Secondary | ICD-10-CM | POA: Diagnosis not present

## 2021-02-10 LAB — HEPATITIS B SURFACE ANTIBODY, QUANTITATIVE: Hep B S AB Quant (Post): <3.1 m[IU]/mL — ABNORMAL LOW (ref >9.9)

## 2021-02-11 DIAGNOSIS — N186 End stage renal disease: Secondary | ICD-10-CM | POA: Diagnosis not present

## 2021-02-11 DIAGNOSIS — N2581 Secondary hyperparathyroidism of renal origin: Secondary | ICD-10-CM | POA: Diagnosis not present

## 2021-02-11 DIAGNOSIS — Z992 Dependence on renal dialysis: Secondary | ICD-10-CM | POA: Diagnosis not present

## 2021-02-12 DIAGNOSIS — N2581 Secondary hyperparathyroidism of renal origin: Secondary | ICD-10-CM | POA: Diagnosis not present

## 2021-02-12 DIAGNOSIS — N186 End stage renal disease: Secondary | ICD-10-CM | POA: Diagnosis not present

## 2021-02-12 DIAGNOSIS — Z992 Dependence on renal dialysis: Secondary | ICD-10-CM | POA: Diagnosis not present

## 2021-02-13 LAB — CULTURE, BLOOD (ROUTINE X 2)
Culture: NO GROWTH
Culture: NO GROWTH

## 2021-02-18 DIAGNOSIS — N2581 Secondary hyperparathyroidism of renal origin: Secondary | ICD-10-CM | POA: Diagnosis not present

## 2021-02-18 DIAGNOSIS — N186 End stage renal disease: Secondary | ICD-10-CM | POA: Diagnosis not present

## 2021-02-18 DIAGNOSIS — Z992 Dependence on renal dialysis: Secondary | ICD-10-CM | POA: Diagnosis not present

## 2021-02-19 DIAGNOSIS — N2581 Secondary hyperparathyroidism of renal origin: Secondary | ICD-10-CM | POA: Diagnosis not present

## 2021-02-19 DIAGNOSIS — N186 End stage renal disease: Secondary | ICD-10-CM | POA: Diagnosis not present

## 2021-02-19 DIAGNOSIS — Z992 Dependence on renal dialysis: Secondary | ICD-10-CM | POA: Diagnosis not present

## 2021-02-21 DIAGNOSIS — N186 End stage renal disease: Secondary | ICD-10-CM | POA: Diagnosis not present

## 2021-02-21 DIAGNOSIS — Z992 Dependence on renal dialysis: Secondary | ICD-10-CM | POA: Diagnosis not present

## 2021-02-21 DIAGNOSIS — N2581 Secondary hyperparathyroidism of renal origin: Secondary | ICD-10-CM | POA: Diagnosis not present

## 2021-02-24 DIAGNOSIS — Z992 Dependence on renal dialysis: Secondary | ICD-10-CM | POA: Diagnosis not present

## 2021-02-24 DIAGNOSIS — N186 End stage renal disease: Secondary | ICD-10-CM | POA: Diagnosis not present

## 2021-02-24 DIAGNOSIS — N2581 Secondary hyperparathyroidism of renal origin: Secondary | ICD-10-CM | POA: Diagnosis not present

## 2021-02-28 ENCOUNTER — Emergency Department (HOSPITAL_COMMUNITY)
Admission: EM | Admit: 2021-02-28 | Discharge: 2021-02-28 | Disposition: A | Discharge status: Home / Self Care | Payer: Medicare HMO | Attending: Emergency Medicine | Admitting: Emergency Medicine

## 2021-02-28 ENCOUNTER — Encounter (HOSPITAL_COMMUNITY): Payer: Self-pay

## 2021-02-28 ENCOUNTER — Other Ambulatory Visit: Payer: Self-pay

## 2021-02-28 DIAGNOSIS — I10 Essential (primary) hypertension: Secondary | ICD-10-CM

## 2021-02-28 DIAGNOSIS — N2581 Secondary hyperparathyroidism of renal origin: Secondary | ICD-10-CM | POA: Diagnosis not present

## 2021-02-28 DIAGNOSIS — R0902 Hypoxemia: Secondary | ICD-10-CM | POA: Diagnosis not present

## 2021-02-28 DIAGNOSIS — I120 Hypertensive chronic kidney disease with stage 5 chronic kidney disease or end stage renal disease: Secondary | ICD-10-CM | POA: Diagnosis not present

## 2021-02-28 DIAGNOSIS — M545 Low back pain, unspecified: Secondary | ICD-10-CM | POA: Diagnosis not present

## 2021-02-28 DIAGNOSIS — M549 Dorsalgia, unspecified: Secondary | ICD-10-CM | POA: Diagnosis not present

## 2021-02-28 DIAGNOSIS — Z79899 Other long term (current) drug therapy: Secondary | ICD-10-CM | POA: Diagnosis not present

## 2021-02-28 DIAGNOSIS — Z992 Dependence on renal dialysis: Secondary | ICD-10-CM | POA: Diagnosis not present

## 2021-02-28 DIAGNOSIS — N186 End stage renal disease: Secondary | ICD-10-CM | POA: Diagnosis not present

## 2021-02-28 DIAGNOSIS — G8929 Other chronic pain: Secondary | ICD-10-CM | POA: Diagnosis not present

## 2021-02-28 MED ORDER — OXYCODONE-ACETAMINOPHEN 5-325 MG PO TABS
1.0000 | ORAL_TABLET | Freq: Once | ORAL | Status: AC
  Administered 2021-02-28: 1 via ORAL
  Filled 2021-02-28: qty 1

## 2021-02-28 MED ORDER — OXYCODONE-ACETAMINOPHEN 5-325 MG PO TABS: 1.0000 | ORAL_TABLET | Freq: Two times a day (BID) | ORAL | 0 refills | Status: DC | PRN

## 2021-02-28 NOTE — ED Provider Notes (Signed)
[facility] EMERGENCY DEPARTMENT Provider Note   CSN: [id number] Arrival date & time: 02/28/21  1733     History  Chief Complaint  Patient presents with   Hypertension    Jason [name]. is a 61 y.o. male with a history of end-stage renal disease on dialysis presenting to ED with hypertension and back pain.  The patient reports that he did complete dialysis today.  He stated blood pressure was very high 200s over 100.  He did take all of his medications including prior to arrival, which includes hydralazine 3 times daily and Coreg and amlodipine.  His blood pressure since come down.  He reports that his primary complaint is chronic back pain.  He has severe issues with his lumbar spine, stenosis, which in certain days limits his ability to walk at all.  It is led to him sometimes missing dialysis appointments due to pain.  In the past he was treated with narcotic pain medications which helped, but he is no longer taking these medicines  HPI     Home Medications Prior to Admission medications   Medication Sig Start Date End Date Taking? Authorizing Provider  oxyCODONE-acetaminophen (PERCOCET/ROXICET) 5-325 MG tablet Take 1 tablet by mouth every 12 (twelve) hours as needed for up to 12 doses for severe pain. 02/28/21  Yes Wyvonnia Dusky, MD  amLODipine (NORVASC) 10 MG tablet Take 10 mg by mouth every morning. 06/20/18   [provider]  carvedilol (COREG) 25 MG tablet Take 1 tablet (25 mg total) by mouth 2 (two) times daily with a meal. 02/02/18   [name], FNP  hydrALAZINE (APRESOLINE) 100 MG tablet Take 1 tablet (100 mg total) by mouth 3 (three) times daily. Patient taking differently: Take 100 mg by mouth 2 (two) times daily. 11/03/19 02/09/21  Darliss Cheney, MD  methocarbamol (ROBAXIN) 500 MG tablet Take 1 tablet (500 mg total) by mouth 2 (two) times daily as needed for muscle spasms. 02/08/21   Caccavale, Sophia, PA-C  multivitamin (RENA-VIT) TABS  tablet Take 1 tablet by mouth every morning. 10/20/17   [provider]  polyvinyl alcohol (LIQUIFILM TEARS) 1.4 % ophthalmic solution Place 1 drop into both eyes daily as needed for dry eyes.    [provider]      Allergies    Lisinopril    Review of Systems   Review of Systems  Physical Exam Updated Vital Signs BP (!) 162/95    Pulse 68    Temp 99.9 F (37.7 C) (Oral)    Resp 18    Ht 6\' 2"  (1.88 m)    Wt 77.1 kg    SpO2 (!) 89%    BMI 21.83 kg/m  Physical Exam Constitutional:      General: He is not in acute distress. HENT:     Head: Normocephalic and atraumatic.  Eyes:     Conjunctiva/sclera: Conjunctivae normal.     Pupils: Pupils are equal, round, and reactive to light.  Cardiovascular:     Rate and Rhythm: Normal rate and regular rhythm.     Pulses: Normal pulses.  Pulmonary:     Effort: Pulmonary effort is normal. No respiratory distress.  Skin:    General: Skin is warm and dry.  Neurological:     General: No focal deficit present.     Mental Status: He is alert and oriented to person, place, and time. Mental status is at baseline.  Psychiatric:  Mood and Affect: Mood normal.        Behavior: Behavior normal.    ED Results / Procedures / Treatments   Labs (all labs ordered are listed, but only abnormal results are displayed) Labs Reviewed - No data to display  EKG EKG Interpretation  Date/Time:  Saturday February 28 2021 17:38:55 EST Ventricular Rate:  81 PR Interval:  157 QRS Duration: 115 QT Interval:  380 QTC Calculation: 442 R Axis:   270 Text Interpretation: Sinus rhythm LAD, consider left anterior fascicular block LVH with secondary repolarization abnormality Confirmed by Octaviano Glow [phone]) on 02/28/2021 7:45:10 PM  Radiology No results found.  Procedures Procedures    Medications Ordered in ED Medications  oxyCODONE-acetaminophen (PERCOCET/ROXICET) 5-325 MG per tablet 1 tablet (1 tablet Oral Given 02/28/21  2003)    ED Course/ Medical Decision Making/ A&P                           Medical Decision Making Risk Prescription drug management.   Patient is here with complaint of worsening back pain, limiting his mobility.  Given that his pain has reached a point where he limits his ability to ambulate and get to dialysis, I do think it is reasonable to treat with some stronger pain medications, least temporarily.  He is in the process of establishing care at a pain clinic, and explained that ER will not continue to prescribe narcotic medications.  In the short-term however I do a few tablets would benefit him.  We will give a dose here in the ED.  Otherwise blood pressure is improved on its own during his stay in the ED, back to within normal limits.  I do not see evidence of hypertensive emergency.  I do not believe we need emergent blood work or CT scan of the head.  I personally reviewed & interpret his EKG which shows a sinus rhythm without acute ischemic findings.         Final Clinical Impression(s) / ED Diagnoses Final diagnoses:  Hypertension, unspecified type  Chronic bilateral back pain, unspecified back location    Rx / DC Orders ED Discharge Orders          Ordered    oxyCODONE-acetaminophen (PERCOCET/ROXICET) 5-325 MG tablet  Every 12 hours PRN        02/28/21 1948              Wyvonnia Dusky, MD 02/28/21 2017

## 2021-02-28 NOTE — ED Notes (Signed)
Received verbal report from Emily D RN at this time 

## 2021-02-28 NOTE — ED Notes (Signed)
Provided pt with sandwich and juice at this time per providers request

## 2021-02-28 NOTE — ED Triage Notes (Signed)
Pt arrived to ED via EMS w/ c/o HTN and headache. Pt went to dialysis today and reports his BP when he left dialysis was 200's/100's. He reports he took his BP meds after dialysis. EMS VS: 186/104 HR 75 85% RA, EMS placed pt on 2L O2 via [place] and O2 sat was then 95%. Pt reports his oxygen sometimes is low. Pt in NAD at this time.

## 2021-03-03 DIAGNOSIS — N186 End stage renal disease: Secondary | ICD-10-CM | POA: Diagnosis not present

## 2021-03-03 DIAGNOSIS — N2581 Secondary hyperparathyroidism of renal origin: Secondary | ICD-10-CM | POA: Diagnosis not present

## 2021-03-03 DIAGNOSIS — Z992 Dependence on renal dialysis: Secondary | ICD-10-CM | POA: Diagnosis not present

## 2021-03-07 DIAGNOSIS — N2581 Secondary hyperparathyroidism of renal origin: Secondary | ICD-10-CM | POA: Diagnosis not present

## 2021-03-07 DIAGNOSIS — N186 End stage renal disease: Secondary | ICD-10-CM | POA: Diagnosis not present

## 2021-03-07 DIAGNOSIS — Z992 Dependence on renal dialysis: Secondary | ICD-10-CM | POA: Diagnosis not present

## 2021-03-09 ENCOUNTER — Other Ambulatory Visit: Payer: Self-pay

## 2021-03-09 NOTE — Patient Outreach (Signed)
[facility]) Care Management  03/09/2021  Jason [name]. 1960-12-12 [id number]   Telephone call to patient for disease management follow up.   No answer.  HIPAA compliant voice message left.    Plan: If no return call, RN CM will attempt patient again in May.  [name], RN, MSN [facility] Care Management Care Management Coordinator Direct Line [phone] Toll Free: [phone]  Fax: [phone]

## 2021-03-10 ENCOUNTER — Ambulatory Visit: Payer: Self-pay

## 2021-03-10 DIAGNOSIS — Z992 Dependence on renal dialysis: Secondary | ICD-10-CM | POA: Diagnosis not present

## 2021-03-10 DIAGNOSIS — I129 Hypertensive chronic kidney disease with stage 1 through stage 4 chronic kidney disease, or unspecified chronic kidney disease: Secondary | ICD-10-CM | POA: Diagnosis not present

## 2021-03-10 DIAGNOSIS — N186 End stage renal disease: Secondary | ICD-10-CM | POA: Diagnosis not present

## 2021-03-11 DIAGNOSIS — Z992 Dependence on renal dialysis: Secondary | ICD-10-CM | POA: Diagnosis not present

## 2021-03-11 DIAGNOSIS — N186 End stage renal disease: Secondary | ICD-10-CM | POA: Diagnosis not present

## 2021-03-11 DIAGNOSIS — N2581 Secondary hyperparathyroidism of renal origin: Secondary | ICD-10-CM | POA: Diagnosis not present

## 2021-03-15 ENCOUNTER — Encounter (HOSPITAL_COMMUNITY): Payer: Self-pay

## 2021-03-15 ENCOUNTER — Emergency Department (HOSPITAL_COMMUNITY)
Admission: EM | Admit: 2021-03-15 | Discharge: 2021-03-15 | Disposition: A | Discharge status: Home / Self Care | Payer: Medicare HMO | Attending: Emergency Medicine | Admitting: Emergency Medicine

## 2021-03-15 ENCOUNTER — Emergency Department (HOSPITAL_COMMUNITY): Payer: Medicare HMO

## 2021-03-15 ENCOUNTER — Other Ambulatory Visit: Payer: Self-pay

## 2021-03-15 DIAGNOSIS — Z79899 Other long term (current) drug therapy: Secondary | ICD-10-CM | POA: Diagnosis not present

## 2021-03-15 DIAGNOSIS — I10 Essential (primary) hypertension: Secondary | ICD-10-CM | POA: Diagnosis not present

## 2021-03-15 DIAGNOSIS — Z794 Long term (current) use of insulin: Secondary | ICD-10-CM | POA: Insufficient documentation

## 2021-03-15 DIAGNOSIS — M549 Dorsalgia, unspecified: Secondary | ICD-10-CM | POA: Insufficient documentation

## 2021-03-15 DIAGNOSIS — I517 Cardiomegaly: Secondary | ICD-10-CM | POA: Diagnosis not present

## 2021-03-15 DIAGNOSIS — R001 Bradycardia, unspecified: Secondary | ICD-10-CM | POA: Diagnosis not present

## 2021-03-15 DIAGNOSIS — R0902 Hypoxemia: Secondary | ICD-10-CM | POA: Diagnosis not present

## 2021-03-15 DIAGNOSIS — G8929 Other chronic pain: Secondary | ICD-10-CM | POA: Insufficient documentation

## 2021-03-15 DIAGNOSIS — N250 Renal osteodystrophy: Secondary | ICD-10-CM | POA: Diagnosis not present

## 2021-03-15 DIAGNOSIS — R0602 Shortness of breath: Secondary | ICD-10-CM | POA: Diagnosis not present

## 2021-03-15 DIAGNOSIS — N186 End stage renal disease: Secondary | ICD-10-CM | POA: Diagnosis not present

## 2021-03-15 DIAGNOSIS — Z20822 Contact with and (suspected) exposure to covid-19: Secondary | ICD-10-CM | POA: Insufficient documentation

## 2021-03-15 DIAGNOSIS — Z992 Dependence on renal dialysis: Secondary | ICD-10-CM | POA: Diagnosis not present

## 2021-03-15 DIAGNOSIS — E875 Hyperkalemia: Secondary | ICD-10-CM | POA: Diagnosis not present

## 2021-03-15 DIAGNOSIS — D631 Anemia in chronic kidney disease: Secondary | ICD-10-CM | POA: Diagnosis not present

## 2021-03-15 DIAGNOSIS — I120 Hypertensive chronic kidney disease with stage 5 chronic kidney disease or end stage renal disease: Secondary | ICD-10-CM | POA: Diagnosis not present

## 2021-03-15 DIAGNOSIS — J9811 Atelectasis: Secondary | ICD-10-CM | POA: Diagnosis not present

## 2021-03-15 LAB — CBC WITH DIFFERENTIAL/PLATELET
Abs Immature Granulocytes: 0.05 10*3/uL (ref 0.00–0.07)
Basophils Absolute: 0 10*3/uL (ref 0.0–0.1)
Basophils Relative: 1 %
Eosinophils Absolute: 0.1 10*3/uL (ref 0.0–0.5)
Eosinophils Relative: 2 %
HCT: 28.8 % — ABNORMAL LOW (ref 39.0–52.0)
Hemoglobin: 9.8 g/dL — ABNORMAL LOW (ref 13.0–17.0)
Immature Granulocytes: 1 %
Lymphocytes Relative: 9 %
Lymphs Abs: 0.4 10*3/uL — ABNORMAL LOW (ref 0.7–4.0)
MCH: 30.7 pg (ref 26.0–34.0)
MCHC: 34 g/dL (ref 30.0–36.0)
MCV: 90.3 fL (ref 80.0–100.0)
Monocytes Absolute: 0.3 10*3/uL (ref 0.1–1.0)
Monocytes Relative: 7 %
Neutro Abs: 3.1 10*3/uL (ref 1.7–7.7)
Neutrophils Relative %: 80 %
Platelets: 170 10*3/uL (ref 150–400)
RBC: 3.19 MIL/uL — ABNORMAL LOW (ref 4.22–5.81)
RDW: 13.8 % (ref 11.5–15.5)
WBC: 3.9 10*3/uL — ABNORMAL LOW (ref 4.0–10.5)
nRBC: 0 % (ref 0.0–0.2)

## 2021-03-15 LAB — BASIC METABOLIC PANEL
Anion gap: 20 — ABNORMAL HIGH (ref 5–15)
BUN: 135 mg/dL — ABNORMAL HIGH (ref 6–20)
CO2: 18 mmol/L — ABNORMAL LOW (ref 22–32)
Calcium: 9 mg/dL (ref 8.9–10.3)
Chloride: 96 mmol/L — ABNORMAL LOW (ref 98–111)
Creatinine, Ser: 16.7 mg/dL — ABNORMAL HIGH (ref 0.61–1.24)
GFR, Estimated: 3 mL/min — ABNORMAL LOW (ref >60)
Glucose, Bld: 92 mg/dL (ref 70–99)
Potassium: >7.5 mmol/L (ref 3.5–5.1)
Sodium: 134 mmol/L — ABNORMAL LOW (ref 135–145)

## 2021-03-15 LAB — I-STAT CHEM 8, ED
BUN: >130 mg/dL — ABNORMAL HIGH (ref 6–20)
Calcium, Ion: 1.06 mmol/L — ABNORMAL LOW (ref 1.15–1.40)
Chloride: 99 mmol/L (ref 98–111)
Creatinine, Ser: >18 mg/dL — ABNORMAL HIGH (ref 0.61–1.24)
Glucose, Bld: 89 mg/dL (ref 70–99)
HCT: 29 % — ABNORMAL LOW (ref 39.0–52.0)
Hemoglobin: 9.9 g/dL — ABNORMAL LOW (ref 13.0–17.0)
Potassium: 8 mmol/L (ref 3.5–5.1)
Sodium: 130 mmol/L — ABNORMAL LOW (ref 135–145)
TCO2: 22 mmol/L (ref 22–32)

## 2021-03-15 LAB — RESP PANEL BY RT-PCR (FLU A&B, COVID) ARPGX2
Influenza A by PCR: NEGATIVE
Influenza B by PCR: NEGATIVE
SARS Coronavirus 2 by RT PCR: NEGATIVE

## 2021-03-15 IMAGING — CR DG CHEST 2V
2 series · 2 of 2 positions shown · non-contrast
Comparison: Chest radiograph dated [DATE]

CLINICAL DATA: Shortness of breath

EXAM:
CHEST - 2 VIEW

[chest lat]
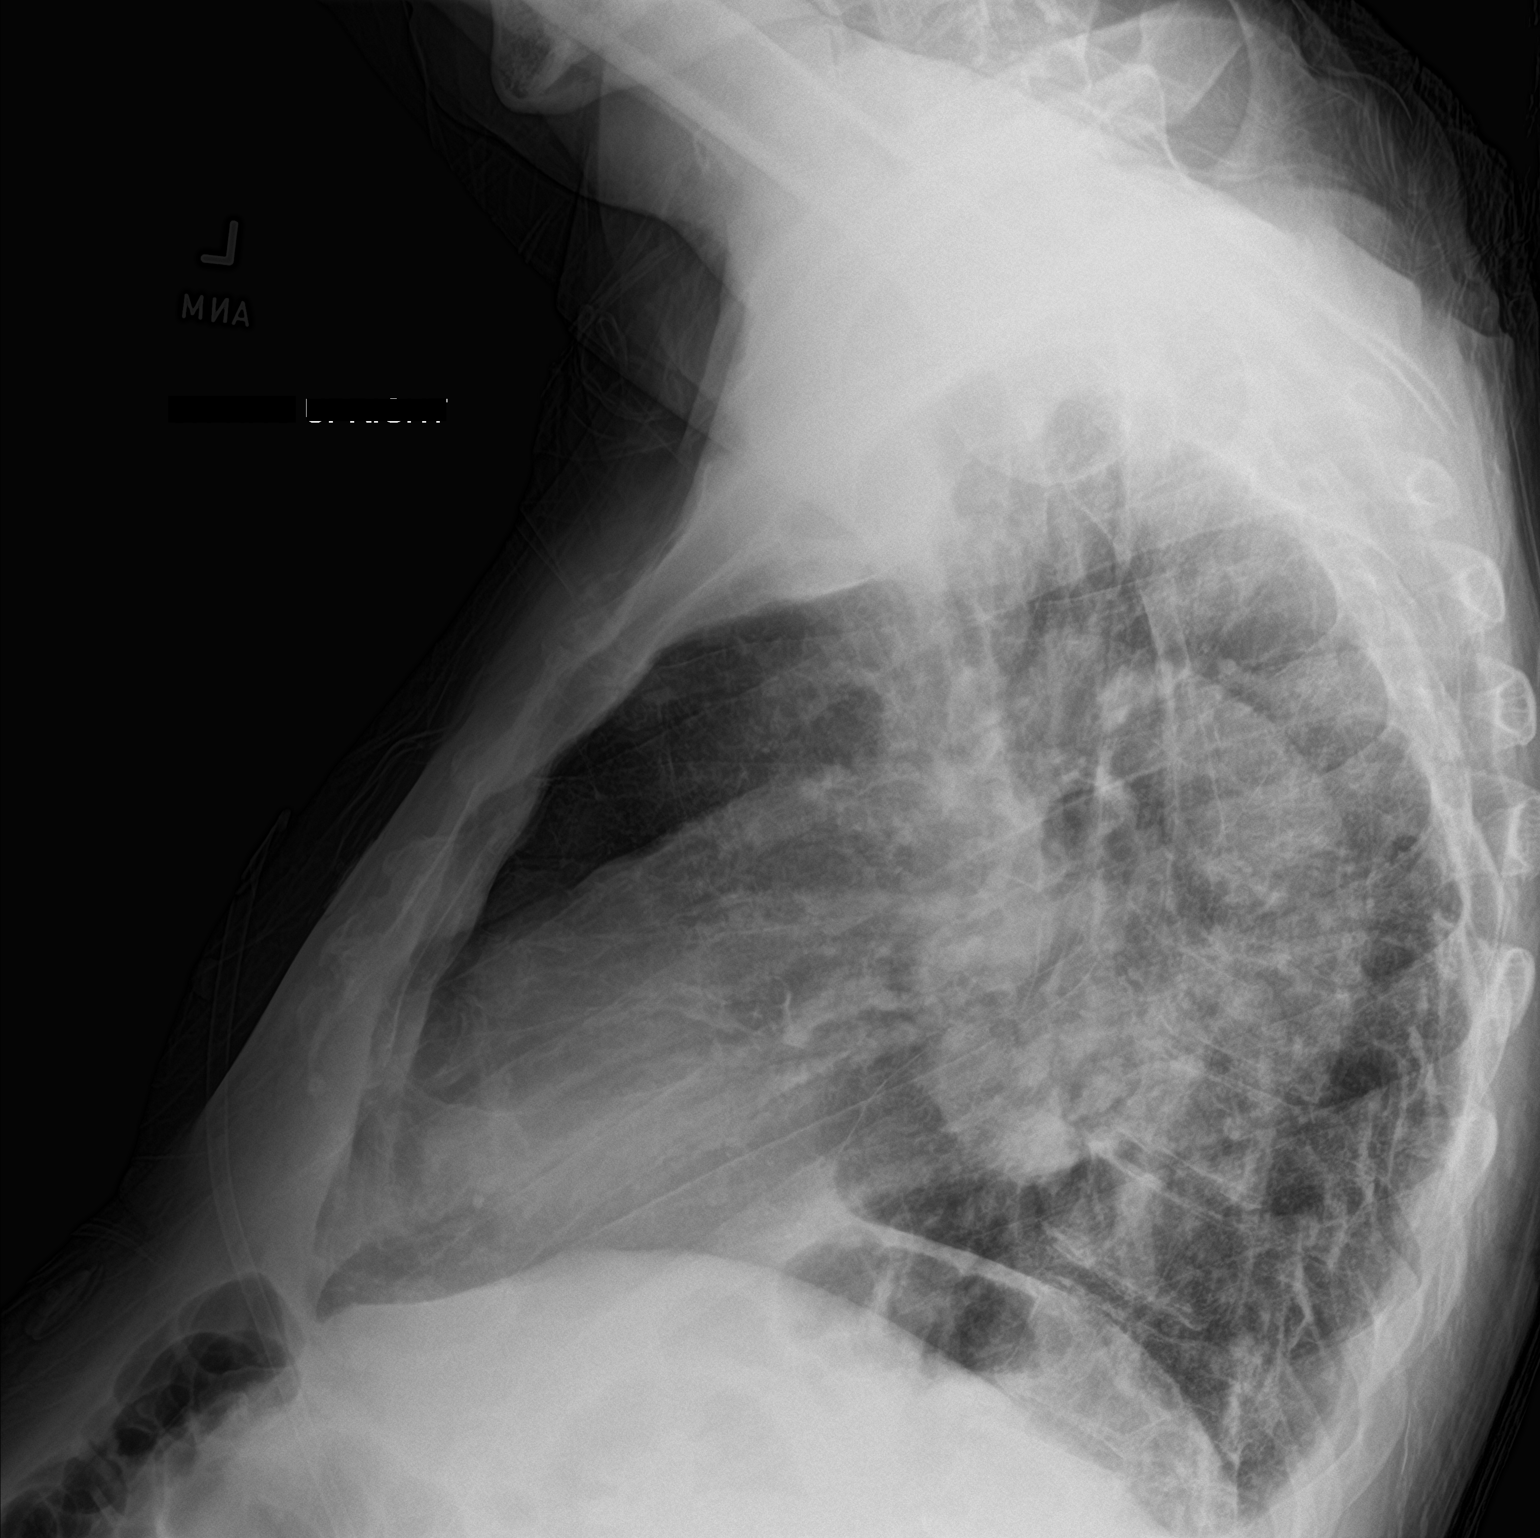

[chest ap]
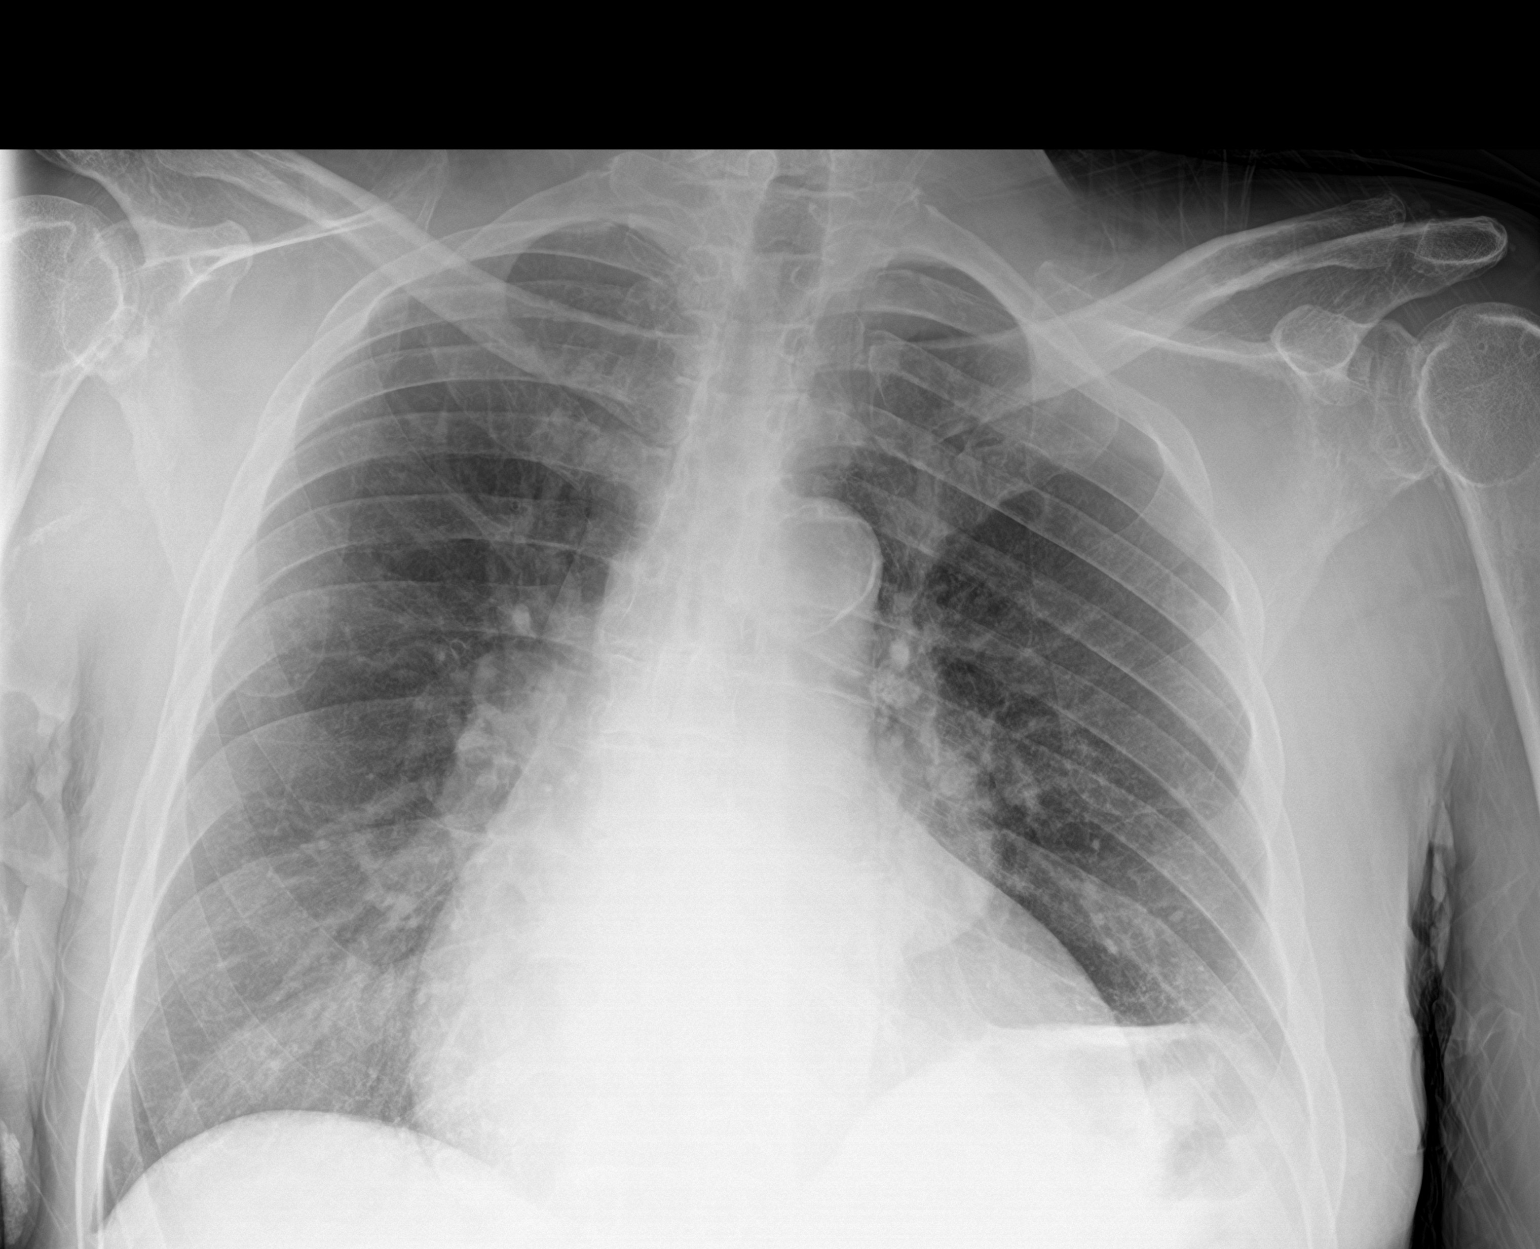

[2 of 2 positions shown; findings below may reference images not displayed]

FINDINGS: The heart is enlarged. Atherosclerotic calcification of the aortic
arch. Elevation of the left hemidiaphragm with mild atelectasis,
unchanged. Lungs otherwise clear without evidence of focal
consolidation or pleural effusion. No acute osseous abnormality.
IMPRESSION: Stable cardiomegaly without evidence of focal consolidation or large
pleural effusion. Chronic elevation of the left hemidiaphragm with
mild atelectasis, unchanged.

## 2021-03-15 MED ORDER — CALCIUM GLUCONATE-NACL 1-0.675 GM/50ML-% IV SOLN
1.0000 g | Freq: Once | INTRAVENOUS | Status: DC
  Filled 2021-03-15: qty 50

## 2021-03-15 MED ORDER — SODIUM ZIRCONIUM CYCLOSILICATE 10 G PO PACK
10.0000 g | PACK | Freq: Once | ORAL | Status: DC
  Filled 2021-03-15: qty 1

## 2021-03-15 MED ORDER — INSULIN ASPART 100 UNIT/ML IV SOLN
5.0000 [IU] | Freq: Once | INTRAVENOUS | Status: AC
  Administered 2021-03-15: 5 [IU] via INTRAVENOUS

## 2021-03-15 MED ORDER — DEXTROSE 50 % IV SOLN
1.0000 | Freq: Once | INTRAVENOUS | Status: AC
  Administered 2021-03-15: 50 mL via INTRAVENOUS
  Filled 2021-03-15: qty 50

## 2021-03-15 MED ORDER — CALCIUM GLUCONATE-NACL 2-0.675 GM/100ML-% IV SOLN
2.0000 g | Freq: Once | INTRAVENOUS | Status: AC
  Administered 2021-03-15: 2000 mg via INTRAVENOUS
  Filled 2021-03-15: qty 100

## 2021-03-15 MED ORDER — SODIUM BICARBONATE 8.4 % IV SOLN
50.0000 meq | Freq: Once | INTRAVENOUS | Status: AC
  Administered 2021-03-15: 50 meq via INTRAVENOUS
  Filled 2021-03-15: qty 50

## 2021-03-15 MED ORDER — CHLORHEXIDINE GLUCONATE CLOTH 2 % EX PADS: 6.0000 | MEDICATED_PAD | Freq: Every day | CUTANEOUS | Status: DC

## 2021-03-15 NOTE — ED Provider Notes (Addendum)
[facility] EMERGENCY DEPARTMENT Provider Note   CSN: [id number] Arrival date & time: 03/15/21  1319     History  Chief Complaint  Patient presents with   Back Pain   Missed dialysis x2    Jason [name]. is a 61 y.o. male.  61 year old male with prior medical history as detailed below presents for evaluation.  Patient is on dialysis.  Patient reports that he missed dialysis sessions earlier this week.  His last dialysis reportedly was on Tuesday.  He reports that he missed his appointments secondary to chronic back pain.  He presents today complaining of feeling short of breath.  He is concerned that he may need dialysis today.  The history is provided by the patient and medical records.  Illness Location:  Missed dialysis, shortness of breath Severity:  Moderate Onset quality:  Gradual Duration:  3 days Timing:  Constant Progression:  Worsening Chronicity:  Recurrent     Home Medications Prior to Admission medications   Medication Sig Start Date End Date Taking? Authorizing Provider  amLODipine (NORVASC) 10 MG tablet Take 10 mg by mouth every morning. 06/20/18  Yes [provider]  carvedilol (COREG) 25 MG tablet Take 1 tablet (25 mg total) by mouth 2 (two) times daily with a meal. 02/02/18  Yes [name], FNP  hydrALAZINE (APRESOLINE) 100 MG tablet Take 1 tablet (100 mg total) by mouth 3 (three) times daily. Patient taking differently: Take 100 mg by mouth 2 (two) times daily. 11/03/19 03/15/21 Yes Pahwani, Einar Grad, MD  lanthanum (FOSRENOL) 1000 MG chewable tablet Chew 1,000 mg by mouth 3 (three) times daily with meals.   Yes [provider]  multivitamin (RENA-VIT) TABS tablet Take 1 tablet by mouth every morning. 10/20/17  Yes [provider]  oxyCODONE-acetaminophen (PERCOCET/ROXICET) 5-325 MG tablet Take 1 tablet by mouth every 12 (twelve) hours as needed for up to 12 doses for severe pain. 02/28/21  Yes Trifan,  Carola Rhine, MD  polyvinyl alcohol (LIQUIFILM TEARS) 1.4 % ophthalmic solution Place 1 drop into both eyes daily as needed for dry eyes.   Yes [provider]  methocarbamol (ROBAXIN) 500 MG tablet Take 1 tablet (500 mg total) by mouth 2 (two) times daily as needed for muscle spasms. Patient not taking: Reported on 03/15/2021 02/08/21   Caccavale, Sophia, PA-C      Allergies    Lisinopril    Review of Systems   Review of Systems  All other systems reviewed and are negative.  Physical Exam Updated Vital Signs BP (!) 190/93    Pulse (!) 49    Temp 98.1 F (36.7 C) (Oral)    Resp (!) 25    Ht '6\' 2"'$  (1.88 m)    Wt 77.1 kg    SpO2 99%    BMI 21.83 kg/m  Physical Exam Vitals and nursing note reviewed.  Constitutional:      General: He is not in acute distress.    Appearance: Normal appearance. He is well-developed.  HENT:     Head: Normocephalic and atraumatic.  Eyes:     Conjunctiva/sclera: Conjunctivae normal.     Pupils: Pupils are equal, round, and reactive to light.  Cardiovascular:     Rate and Rhythm: Normal rate and regular rhythm.     Heart sounds: Normal heart sounds.  Pulmonary:     Effort: Pulmonary effort is normal. No respiratory distress.     Breath sounds: Normal breath sounds.  Abdominal:  General: There is no distension.     Palpations: Abdomen is soft.     Tenderness: There is no abdominal tenderness.  Musculoskeletal:        General: No deformity. Normal range of motion.     Cervical back: Normal range of motion and neck supple.  Skin:    General: Skin is warm and dry.  Neurological:     General: No focal deficit present.     Mental Status: He is alert and oriented to person, place, and time.    ED Results / Procedures / Treatments   Labs (all labs ordered are listed, but only abnormal results are displayed) Labs Reviewed  BASIC METABOLIC PANEL - Abnormal; Notable for the following components:      Result Value   Sodium 134 (*)    Potassium  >7.5 (*)    Chloride 96 (*)    CO2 18 (*)    BUN 135 (*)    Creatinine, Ser 16.70 (*)    GFR, Estimated 3 (*)    Anion gap 20 (*)    All other components within normal limits  CBC WITH DIFFERENTIAL/PLATELET - Abnormal; Notable for the following components:   WBC 3.9 (*)    RBC 3.19 (*)    Hemoglobin 9.8 (*)    HCT 28.8 (*)    Lymphs Abs 0.4 (*)    All other components within normal limits  I-STAT CHEM 8, ED - Abnormal; Notable for the following components:   Sodium 130 (*)    Potassium 8.0 (*)    BUN >130 (*)    Creatinine, Ser >18.00 (*)    Calcium, Ion 1.06 (*)    Hemoglobin 9.9 (*)    HCT 29.0 (*)    All other components within normal limits  RESP PANEL BY RT-PCR (FLU A&B, COVID) ARPGX2  POC SARS CORONAVIRUS 2 AG -  ED    EKG EKG Interpretation  Date/Time:  Sunday March 15 2021 13:59:53 EST Ventricular Rate:  47 PR Interval:  178 QRS Duration: 146 QT Interval:  530 QTC Calculation: 469 R Axis:   164 Text Interpretation: Sinus bradycardia Right axis deviation Non-specific intra-ventricular conduction block Abnormal ECG When compared with ECG of 28-Feb-2021 17:38, PREVIOUS ECG IS PRESENT Confirmed by Dene Gentry [phone]) on 03/15/2021 3:09:10 PM  Radiology DG Chest 2 View  Result Date: 03/15/2021 CLINICAL DATA:  Shortness of breath EXAM: CHEST - 2 VIEW COMPARISON:  Chest radiograph dated February 08, 2021 FINDINGS: The heart is enlarged. Atherosclerotic calcification of the aortic arch. Elevation of the left hemidiaphragm with mild atelectasis, unchanged. Lungs otherwise clear without evidence of focal consolidation or pleural effusion. No acute osseous abnormality. IMPRESSION: Stable cardiomegaly without evidence of focal consolidation or large pleural effusion. Chronic elevation of the left hemidiaphragm with mild atelectasis, unchanged. Electronically Signed   By: Keane Police D.O.   On: 03/15/2021 14:23    Procedures Procedures    Medications Ordered in  ED Medications  Chlorhexidine Gluconate Cloth 2 % PADS 6 each (has no administration in time range)  calcium gluconate 2 g/ 100 mL sodium chloride IVPB (2,000 mg Intravenous New Bag/Given 03/15/21 1515)  insulin aspart (novoLOG) injection 5 Units (5 Units Intravenous Given 03/15/21 1508)    And  dextrose 50 % solution 50 mL (50 mLs Intravenous Given 03/15/21 1508)  sodium bicarbonate injection 50 mEq (50 mEq Intravenous Given 03/15/21 1508)    ED Course/ Medical Decision Making/ A&P  Medical Decision Making Amount and/or Complexity of Data Reviewed Labs: ordered. Radiology: ordered.  Risk OTC drugs. Prescription drug management.    Medical Screen Complete  This patient presented to the ED with complaint of missed dialysis.  This complaint involves an extensive number of treatment options. The initial differential diagnosis includes, but is not limited to, missed dialysis, ESRD, metabolic abnormality, hyperkalemia, fluid overload, etc  This presentation is: Acute, Chronic, Self-Limited, Previously Undiagnosed, Uncertain Prognosis, Complicated, Systemic Symptoms, and Threat to Life/Bodily Function  Patient presenting with complaint of shortness of breath after missed dialysis sessions.  Patient's work-up is concerning for elevated potassium.  Initiation of treatment of hyperkalemia begun here in the ED.  Patient with evidence of fluid overload with decreased room air pulse ox down to 90%.  Patient would benefit from emergent dialysis  Nephrology - Dr. Joelyn Oms - is aware of case.  Patient to be taken to dialysis today. Patient is to return to the ED after dialysis for likely discharge.    Co morbidities that complicated the patient's evaluation  ESRD, dialysis, noncompliance   Additional history obtained:  External records from outside sources obtained and reviewed including prior ED visits and prior Inpatient records.    Lab Tests:  I ordered and  personally interpreted labs.  The pertinent results include: CBC, BMP,   Imaging Studies ordered:  I ordered imaging studies including cxr  I independently visualized and interpreted obtained imaging which showed fluid overload I agree with the radiologist interpretation.   Cardiac Monitoring:  The patient was maintained on a cardiac monitor.  I personally viewed and interpreted the cardiac monitor which showed an underlying rhythm of: nsr   Medicines ordered:  I ordered medication including treatment of hyperkalemia Reevaluation of the patient after these medicines showed that the patient: improved    Problem List / ED Course:  ESRD, missed dialysis, hyper-K   Reevaluation:  After the interventions noted above, I reevaluated the patient and found that they have: improved   Disposition:  After consideration of the diagnostic results and the patients response to treatment, I feel that the patent would benefit from evaluation by nephrology and emergent dialysis.      CRITICAL CARE Performed by: Valarie Merino   Total critical care time: 30 minutes  Critical care time was exclusive of separately billable procedures and treating other patients.  Critical care was necessary to treat or prevent imminent or life-threatening deterioration.  Critical care was time spent personally by me on the following activities: development of treatment plan with patient and/or surrogate as well as nursing, discussions with consultants, evaluation of patient's response to treatment, examination of patient, obtaining history from patient or surrogate, ordering and performing treatments and interventions, ordering and review of laboratory studies, ordering and review of radiographic studies, pulse oximetry and re-evaluation of patient's condition. 30    Final Clinical Impression(s) / ED Diagnoses Final diagnoses:  ESRD (end stage renal disease) ([place])  Hyperkalemia    Rx / DC  Orders ED Discharge Orders     None         Valarie Merino, MD 03/15/21 1545    Valarie Merino, MD 03/15/21 [id number]

## 2021-03-15 NOTE — ED Provider Notes (Signed)
The patient was seen earlier and found to require urgent dialysis.  He has had dialysis and is now back in the department.  He has no complaints.  Will discharge home. ?  Deno Etienne, DO ?03/15/21 2036 ? ?

## 2021-03-15 NOTE — Consult Note (Addendum)
Jason [name]. ?Admit Date: 03/15/2021 ?03/15/2021 ?Jason [name] ?Requesting Physician:  Jason Greaves MD ? ?Reason for Consult:  ESRD Hyperkalemia SOB ?HPI:  ?[name] presented to ED earlier today with c/o chronic back pain and SOB.  Missed HD 3/2 and 3/4, last Tx 2/28 and was only 75% of prescribed time. He left 4kg above his EDW.  USes RUE AVF, has B/T currently.  ? ?Here K is 8.0, has sinus bradycardia in 40s which appears new.  BPs elevated, req 2L [place] to keep SPO2 up ? ?ED ordered IV Ca, insuln/dextrose, and NaHCO3 ? ?Outpt HD Orders ?Unit: [place] ?Days: THS ?Time: 4h73mn ?Dialyzer: F200 ?EDW: 76kg ?K/Ca: 2/2 ?Access: RUE AVF ?Needle Size: 15g ?BFR/DFR: 400 / 800 ?UF [place]: None ?VDRA: Hectoral 511m qTx ?EPO: Mircera 75 q2wk, last given 02/05/21 ?Heparin: IVB 6k qTx ?Treatment Adherence: poor ? ? ?ROS ?Balance of 12 systems is negative w/ exceptions as above ? ?PMH  ?Past Medical History:  ?Diagnosis Date  ? A-V fistula (HCMila [place]  ? Right arm  ? Abdominal hernia   ? AKI (acute kidney injury) (HCEleva01/2017  ? Anasarca   ? Anemia   ? Asthma   ? as a child  ? Bilateral inguinal hernia   ? Bilateral pleural effusion 01/2017  ? Cellulitis 01/16/2017  ? Bilateral lower extremity  ? CHF (congestive heart failure) (HCTina  ? Chronic venous insufficiency   ? Concentric left ventricular hypertrophy 08/17/2017  ? Moderated, noted on ECHO  ? Diastolic dysfunction 0890/24/0973? noted on ECHO  ? Elevated LFTs   ? per patient, resolved  ? End stage renal disease (HCGads[place]  ? Dialysis T/Th/Sa  Started 02/2017/pt is waiting for a kidney transplant  ? H/O right inguinal hernia repair 11/2017  ? History of colonoscopy 03/20/2018  ? History of elevated PSA 10/2017  ? Hypertension   ? Pneumonia   ? Pulmonary hypertension (HCGlac[place]  ? Moderate  ? Venous stasis ulcer (HCC[place]umbus  ? bil legs/ healed up now per pt (03/06/18)  ? ?PSH  ?Past Surgical History:  ?Procedure Laterality Date  ? AV FISTULA PLACEMENT Right 01/20/2017  ?  Procedure: ARTERIOVENOUS (AV) FISTULA CREATION;  Surgeon: DiAngelia MouldMD;  [place] [facility] Service: [facility]ascular;  Laterality: Right;  ? HERNIA REPAIR    ? INSERTION OF DIALYSIS CATHETER Right 01/20/2017  ? Procedure: INSERTION OF DIALYSIS CATHETER;  Surgeon: DiAngelia MouldMD;  [place] Service: Vascular;  Laterality: Right;  ? INSERTION OF MESH N/A 11/18/2017  ? Procedure: INSERTION OF MESH;  Surgeon: GrMichael BostonMD;  Location: [facility];  Service: General;  Laterality: N/A;  ? IR FLUORO GUIDE CV LINE RIGHT  01/18/2017  ? IR LUMBAR DISC ASPIRATION W/IMG GUIDE  07/21/2020  ? IR USK[place]UIDE VASC ACCESS RIGHT  01/18/2017  ? LAPAROSCOPIC INGUINAL HERNIA WITH UMBILICAL HERNIA Bilateral 11/18/2017  ? Procedure: LAPAROSCOPIC BILATERAL INGUINAL HERNIA REPAIR WITH UMBILICAL HERNIA REPAIR WITH MESH;  Surgeon: GrMichael BostonMD;  Location: [facility]S;  Service: General;  Laterality: Bilateral;  ? TOE SURGERY    ? right fracture in big toe  ? TONSILLECTOMY    ? ?FH  ?Family History  ?Problem Relation Age of Onset  ? Heart attack Mother 6073?     CABG hx  ? Heart disease Mother   ? Hypertension Mother   ? Stroke Mother   ? Diabetes Father   ?  Healthy Brother   ? Prostate cancer Maternal Grandfather   ? Colon cancer Neg Hx   ? Colon polyps Neg Hx   ? Esophageal cancer Neg Hx   ? Stomach cancer Neg Hx   ? Rectal cancer Neg Hx   ? ?[place]  reports that he quit smoking about 26 years ago. He has never used smokeless tobacco. He reports that he does not currently use alcohol. He reports that he does not currently use drugs. ?Allergies  ?Allergies  ?Allergen Reactions  ? Lisinopril Cough  ? ?Home medications ?Prior to Admission medications   ?Medication Sig Start Date End Date Taking? Authorizing Provider  ?amLODipine (NORVASC) 10 MG tablet Take 10 mg by mouth every morning. 06/20/18  Yes [provider]  ?carvedilol (COREG) 25 MG tablet Take 1 tablet (25 mg total) by mouth 2 (two) times daily with a meal. 02/02/18   Yes [name], FNP  ?hydrALAZINE (APRESOLINE) 100 MG tablet Take 1 tablet (100 mg total) by mouth 3 (three) times daily. ?Patient taking differently: Take 100 mg by mouth 2 (two) times daily. 11/03/19 03/15/21 Yes Pahwani, Einar Grad, MD  ?lanthanum (FOSRENOL) 1000 MG chewable tablet Chew 1,000 mg by mouth 3 (three) times daily with meals.   Yes [provider]  ?multivitamin (RENA-VIT) TABS tablet Take 1 tablet by mouth every morning. 10/20/17  Yes [provider]  ?oxyCODONE-acetaminophen (PERCOCET/ROXICET) 5-325 MG tablet Take 1 tablet by mouth every 12 (twelve) hours as needed for up to 12 doses for severe pain. 02/28/21  Yes Trifan, Carola Rhine, MD  ?polyvinyl alcohol (LIQUIFILM TEARS) 1.4 % ophthalmic solution Place 1 drop into both eyes daily as needed for dry eyes.   Yes [provider]  ?methocarbamol (ROBAXIN) 500 MG tablet Take 1 tablet (500 mg total) by mouth 2 (two) times daily as needed for muscle spasms. ?Patient not taking: Reported on 03/15/2021 02/08/21   Caccavale, Sophia, PA-C  ? ? ?Current Medications ?Scheduled Meds: ? [START ON 03/16/2021] Chlorhexidine Gluconate Cloth  6 each Topical Q0600  ? insulin aspart  5 Units Intravenous Once  ? And  ? dextrose  1 ampule Intravenous Once  ? sodium bicarbonate  50 mEq Intravenous Once  ? ?Continuous Infusions: ? calcium gluconate    ? calcium gluconate    ? ?PRN Meds:. ? ?CBC ?Recent Labs  ?Lab 03/15/21 ?1348 03/15/21 ?1401  ?WBC 3.9*  --   ?NEUTROABS 3.1  --   ?HGB 9.8* 9.9*  ?HCT 28.8* 29.0*  ?MCV 90.3  --   ?PLT 170  --   ? ?Basic Metabolic Panel ?Recent Labs  ?Lab 03/15/21 ?1348 03/15/21 ?1401  ?NA 134* 130*  ?K >7.5* 8.0*  ?CL 96* 99  ?CO2 18*  --   ?GLUCOSE 92 89  ?BUN 135* >130*  ?CREATININE 16.70* >18.00*  ?CALCIUM 9.0  --   ? ? ?Physical Exam   ?Blood pressure (!) 171/77, pulse (!) 49, temperature 98.1 ?F (36.7 ?C), temperature source Oral, resp. rate 15, height '6\' 2"'$  (1.88 m), weight 77.1 kg, SpO2 99 %. ?GEN: mild  distress ?ENT: NCAT ?EYES: EOMI ?CV: bradycardic, regular ?PULM: CTAB nl wob ?ABD: s/nt/nd ?SKIN: NO rashes/lesions ?TKZ:SWFUX LEE ?RUE AVF +B/T ?NEURO: some myoclonus noted ? ?Assessment ?45M ESRD with severe hyperkalemia and mild resp distress.   ? ?Severe hyperkalemia: temporizing measures in ED.  HD now, 1K x45mn, then 2K x 1290m.  UF goal 3-4L,  Has sig azotemia but needs full HD because of K. ?ESRD THS  RUE AVF [place] As above ?Nonadherence ?AHRF on [place],  ?Acute elevation of BPs ?Anemia, Hb just near 10, CTM ?BMM, no immediate issues ?Metabolic acidosis ? ?Plan ?As above ?HD now ?Will come back to ED after HD for re-eval for disposition ?Will follow along ? ? ?Jason [name]  ?03/15/2021, 3:08 PM ? ? ?  ?

## 2021-03-15 NOTE — ED Triage Notes (Signed)
Per EMS, Pt, from hotel, c/o low back pain x weeks.  Sts Pt has been taking "oxy" for pain, but "it has stopped working.  Pain radiates down bilateral legs.  Also, Pt has missed his last 2 dialysis appointments (Thurs and Sat) d/t pain.   ? ?EMS reported Pt was 87% on RA and 97% 4L [place].   ? ?Pt 92% RA during triage.  ?

## 2021-03-15 NOTE — Discharge Instructions (Addendum)
He should try to be compliant with your dialysis.  If you miss dialysis your potassium could get high it could kill you.  That the same way that they use for lethal injection. ?

## 2021-03-15 NOTE — ED Notes (Signed)
Pt given sandwich bag and juice by ED staff.  ?

## 2021-03-15 NOTE — ED Notes (Signed)
Report given to Dialysis Ctr.  Ctr staff reports they will be sending Pt back to ED.  ?

## 2021-03-15 NOTE — ED Notes (Signed)
Pt reports to EDP SOB and back pain are not new and sts Oxy has been working well, but he is "almost out." ?

## 2021-03-17 DIAGNOSIS — N186 End stage renal disease: Secondary | ICD-10-CM | POA: Diagnosis not present

## 2021-03-17 DIAGNOSIS — Z992 Dependence on renal dialysis: Secondary | ICD-10-CM | POA: Diagnosis not present

## 2021-03-17 DIAGNOSIS — N2581 Secondary hyperparathyroidism of renal origin: Secondary | ICD-10-CM | POA: Diagnosis not present

## 2021-03-19 DIAGNOSIS — N186 End stage renal disease: Secondary | ICD-10-CM | POA: Diagnosis not present

## 2021-03-19 DIAGNOSIS — N2581 Secondary hyperparathyroidism of renal origin: Secondary | ICD-10-CM | POA: Diagnosis not present

## 2021-03-19 DIAGNOSIS — Z992 Dependence on renal dialysis: Secondary | ICD-10-CM | POA: Diagnosis not present

## 2021-03-24 DIAGNOSIS — Z992 Dependence on renal dialysis: Secondary | ICD-10-CM | POA: Diagnosis not present

## 2021-03-24 DIAGNOSIS — N2581 Secondary hyperparathyroidism of renal origin: Secondary | ICD-10-CM | POA: Diagnosis not present

## 2021-03-24 DIAGNOSIS — N186 End stage renal disease: Secondary | ICD-10-CM | POA: Diagnosis not present

## 2021-03-26 DIAGNOSIS — N2581 Secondary hyperparathyroidism of renal origin: Secondary | ICD-10-CM | POA: Diagnosis not present

## 2021-03-26 DIAGNOSIS — Z992 Dependence on renal dialysis: Secondary | ICD-10-CM | POA: Diagnosis not present

## 2021-03-26 DIAGNOSIS — N186 End stage renal disease: Secondary | ICD-10-CM | POA: Diagnosis not present

## 2021-03-30 DIAGNOSIS — Z992 Dependence on renal dialysis: Secondary | ICD-10-CM | POA: Diagnosis not present

## 2021-03-30 DIAGNOSIS — N2581 Secondary hyperparathyroidism of renal origin: Secondary | ICD-10-CM | POA: Diagnosis not present

## 2021-03-30 DIAGNOSIS — N186 End stage renal disease: Secondary | ICD-10-CM | POA: Diagnosis not present

## 2021-03-31 DIAGNOSIS — N2581 Secondary hyperparathyroidism of renal origin: Secondary | ICD-10-CM | POA: Diagnosis not present

## 2021-03-31 DIAGNOSIS — N186 End stage renal disease: Secondary | ICD-10-CM | POA: Diagnosis not present

## 2021-03-31 DIAGNOSIS — Z992 Dependence on renal dialysis: Secondary | ICD-10-CM | POA: Diagnosis not present

## 2021-04-02 DIAGNOSIS — N2581 Secondary hyperparathyroidism of renal origin: Secondary | ICD-10-CM | POA: Diagnosis not present

## 2021-04-02 DIAGNOSIS — N186 End stage renal disease: Secondary | ICD-10-CM | POA: Diagnosis not present

## 2021-04-02 DIAGNOSIS — Z992 Dependence on renal dialysis: Secondary | ICD-10-CM | POA: Diagnosis not present

## 2021-04-04 DIAGNOSIS — Z992 Dependence on renal dialysis: Secondary | ICD-10-CM | POA: Diagnosis not present

## 2021-04-04 DIAGNOSIS — N186 End stage renal disease: Secondary | ICD-10-CM | POA: Diagnosis not present

## 2021-04-04 DIAGNOSIS — N2581 Secondary hyperparathyroidism of renal origin: Secondary | ICD-10-CM | POA: Diagnosis not present

## 2021-04-07 ENCOUNTER — Telehealth: Payer: Self-pay | Admitting: Cardiology

## 2021-04-07 DIAGNOSIS — N186 End stage renal disease: Secondary | ICD-10-CM | POA: Diagnosis not present

## 2021-04-07 DIAGNOSIS — N2581 Secondary hyperparathyroidism of renal origin: Secondary | ICD-10-CM | POA: Diagnosis not present

## 2021-04-07 DIAGNOSIS — Z992 Dependence on renal dialysis: Secondary | ICD-10-CM | POA: Diagnosis not present

## 2021-04-07 NOTE — Telephone Encounter (Signed)
Spoke with pt, he reports that his blood pressure is running in the 90's/50's and the highest he has seen is 115/70. He reports they are having to stop the dialysis because his blood pressure will get too low. He reports dizziness when standing from sitting. He has started working out and feels that is why his blood pressure has changed. Medications confirmed. Will forward for dr [name] review  ?

## 2021-04-07 NOTE — Telephone Encounter (Signed)
New Message: ? ? ? ?Patient said he was faxing his blood pressure readings to Dr [name]. He says his blood pressure is running low. He says he wonder if he should taking some of his blood pressure medicine.? ?

## 2021-04-07 NOTE — Telephone Encounter (Signed)
Left message for pt to call.

## 2021-04-08 NOTE — Telephone Encounter (Signed)
Left message for pt to call.

## 2021-04-10 DIAGNOSIS — Z992 Dependence on renal dialysis: Secondary | ICD-10-CM | POA: Diagnosis not present

## 2021-04-10 DIAGNOSIS — N186 End stage renal disease: Secondary | ICD-10-CM | POA: Diagnosis not present

## 2021-04-10 DIAGNOSIS — I129 Hypertensive chronic kidney disease with stage 1 through stage 4 chronic kidney disease, or unspecified chronic kidney disease: Secondary | ICD-10-CM | POA: Diagnosis not present

## 2021-04-14 ENCOUNTER — Encounter: Payer: Self-pay | Admitting: *Deleted

## 2021-04-14 NOTE — Telephone Encounter (Signed)
Letter mailed to the patient asking him top stop the hydralazine. ?

## 2021-04-16 DIAGNOSIS — N2581 Secondary hyperparathyroidism of renal origin: Secondary | ICD-10-CM | POA: Diagnosis not present

## 2021-04-16 DIAGNOSIS — N186 End stage renal disease: Secondary | ICD-10-CM | POA: Diagnosis not present

## 2021-04-16 DIAGNOSIS — Z992 Dependence on renal dialysis: Secondary | ICD-10-CM | POA: Diagnosis not present

## 2021-04-18 DIAGNOSIS — Z992 Dependence on renal dialysis: Secondary | ICD-10-CM | POA: Diagnosis not present

## 2021-04-18 DIAGNOSIS — N2581 Secondary hyperparathyroidism of renal origin: Secondary | ICD-10-CM | POA: Diagnosis not present

## 2021-04-18 DIAGNOSIS — N186 End stage renal disease: Secondary | ICD-10-CM | POA: Diagnosis not present

## 2021-04-21 DIAGNOSIS — N186 End stage renal disease: Secondary | ICD-10-CM | POA: Diagnosis not present

## 2021-04-21 DIAGNOSIS — N2581 Secondary hyperparathyroidism of renal origin: Secondary | ICD-10-CM | POA: Diagnosis not present

## 2021-04-21 DIAGNOSIS — Z992 Dependence on renal dialysis: Secondary | ICD-10-CM | POA: Diagnosis not present

## 2021-04-23 DIAGNOSIS — N2581 Secondary hyperparathyroidism of renal origin: Secondary | ICD-10-CM | POA: Diagnosis not present

## 2021-04-23 DIAGNOSIS — Z992 Dependence on renal dialysis: Secondary | ICD-10-CM | POA: Diagnosis not present

## 2021-04-23 DIAGNOSIS — N186 End stage renal disease: Secondary | ICD-10-CM | POA: Diagnosis not present

## 2021-04-25 DIAGNOSIS — Z992 Dependence on renal dialysis: Secondary | ICD-10-CM | POA: Diagnosis not present

## 2021-04-25 DIAGNOSIS — N2581 Secondary hyperparathyroidism of renal origin: Secondary | ICD-10-CM | POA: Diagnosis not present

## 2021-04-25 DIAGNOSIS — N186 End stage renal disease: Secondary | ICD-10-CM | POA: Diagnosis not present

## 2021-04-28 ENCOUNTER — Other Ambulatory Visit: Payer: Self-pay

## 2021-04-28 NOTE — Patient Outreach (Signed)
[facility]) Care Management ? ?04/28/2021 ? ?[name] ?03-04-1960 ?[id number] ? ? ?Patient continues with unable to reach since September 2022.  RN CM will close case.   ? ?[name], RN, MSN ?[facility] Care Management ?Care Management Coordinator ?Direct Line [phone] ?Toll Free: [phone]  ?Fax: [phone] ? ?

## 2021-04-29 DIAGNOSIS — N2581 Secondary hyperparathyroidism of renal origin: Secondary | ICD-10-CM | POA: Diagnosis not present

## 2021-04-29 DIAGNOSIS — N186 End stage renal disease: Secondary | ICD-10-CM | POA: Diagnosis not present

## 2021-04-29 DIAGNOSIS — Z992 Dependence on renal dialysis: Secondary | ICD-10-CM | POA: Diagnosis not present

## 2021-04-30 DIAGNOSIS — Z992 Dependence on renal dialysis: Secondary | ICD-10-CM | POA: Diagnosis not present

## 2021-04-30 DIAGNOSIS — N186 End stage renal disease: Secondary | ICD-10-CM | POA: Diagnosis not present

## 2021-04-30 DIAGNOSIS — N2581 Secondary hyperparathyroidism of renal origin: Secondary | ICD-10-CM | POA: Diagnosis not present

## 2021-05-02 DIAGNOSIS — Z992 Dependence on renal dialysis: Secondary | ICD-10-CM | POA: Diagnosis not present

## 2021-05-02 DIAGNOSIS — N2581 Secondary hyperparathyroidism of renal origin: Secondary | ICD-10-CM | POA: Diagnosis not present

## 2021-05-02 DIAGNOSIS — N186 End stage renal disease: Secondary | ICD-10-CM | POA: Diagnosis not present

## 2021-05-07 DIAGNOSIS — Z992 Dependence on renal dialysis: Secondary | ICD-10-CM | POA: Diagnosis not present

## 2021-05-07 DIAGNOSIS — N186 End stage renal disease: Secondary | ICD-10-CM | POA: Diagnosis not present

## 2021-05-07 DIAGNOSIS — N2581 Secondary hyperparathyroidism of renal origin: Secondary | ICD-10-CM | POA: Diagnosis not present

## 2021-05-09 ENCOUNTER — Observation Stay (HOSPITAL_COMMUNITY): Payer: Medicare HMO

## 2021-05-09 ENCOUNTER — Emergency Department (HOSPITAL_COMMUNITY): Payer: Medicare HMO

## 2021-05-09 ENCOUNTER — Encounter (HOSPITAL_COMMUNITY): Payer: Self-pay

## 2021-05-09 ENCOUNTER — Other Ambulatory Visit: Payer: Self-pay

## 2021-05-09 ENCOUNTER — Inpatient Hospital Stay (HOSPITAL_COMMUNITY)
Admission: EM | Admit: 2021-05-09 | Discharge: 2021-05-11 | DRG: 640 | Disposition: A | Discharge status: Home / Self Care | Payer: Medicare HMO | Attending: Internal Medicine | Admitting: Internal Medicine

## 2021-05-09 DIAGNOSIS — G8929 Other chronic pain: Secondary | ICD-10-CM | POA: Diagnosis not present

## 2021-05-09 DIAGNOSIS — E875 Hyperkalemia: Principal | ICD-10-CM | POA: Diagnosis present

## 2021-05-09 DIAGNOSIS — Z91158 Patient's noncompliance with renal dialysis for other reason: Secondary | ICD-10-CM

## 2021-05-09 DIAGNOSIS — Z5900 Homelessness unspecified: Secondary | ICD-10-CM

## 2021-05-09 DIAGNOSIS — M25562 Pain in left knee: Secondary | ICD-10-CM | POA: Diagnosis present

## 2021-05-09 DIAGNOSIS — Z888 Allergy status to other drugs, medicaments and biological substances status: Secondary | ICD-10-CM

## 2021-05-09 DIAGNOSIS — M7989 Other specified soft tissue disorders: Secondary | ICD-10-CM | POA: Diagnosis not present

## 2021-05-09 DIAGNOSIS — N186 End stage renal disease: Secondary | ICD-10-CM | POA: Diagnosis present

## 2021-05-09 DIAGNOSIS — D631 Anemia in chronic kidney disease: Secondary | ICD-10-CM | POA: Diagnosis present

## 2021-05-09 DIAGNOSIS — I272 Pulmonary hypertension, unspecified: Secondary | ICD-10-CM | POA: Diagnosis present

## 2021-05-09 DIAGNOSIS — Z8249 Family history of ischemic heart disease and other diseases of the circulatory system: Secondary | ICD-10-CM | POA: Diagnosis not present

## 2021-05-09 DIAGNOSIS — R9389 Abnormal findings on diagnostic imaging of other specified body structures: Secondary | ICD-10-CM

## 2021-05-09 DIAGNOSIS — I132 Hypertensive heart and chronic kidney disease with heart failure and with stage 5 chronic kidney disease, or end stage renal disease: Secondary | ICD-10-CM | POA: Diagnosis present

## 2021-05-09 DIAGNOSIS — Z992 Dependence on renal dialysis: Secondary | ICD-10-CM

## 2021-05-09 DIAGNOSIS — M898X9 Other specified disorders of bone, unspecified site: Secondary | ICD-10-CM | POA: Diagnosis present

## 2021-05-09 DIAGNOSIS — I10 Essential (primary) hypertension: Secondary | ICD-10-CM | POA: Diagnosis not present

## 2021-05-09 DIAGNOSIS — I5032 Chronic diastolic (congestive) heart failure: Secondary | ICD-10-CM | POA: Diagnosis present

## 2021-05-09 DIAGNOSIS — M545 Low back pain, unspecified: Secondary | ICD-10-CM | POA: Diagnosis not present

## 2021-05-09 DIAGNOSIS — Z87891 Personal history of nicotine dependence: Secondary | ICD-10-CM | POA: Diagnosis not present

## 2021-05-09 DIAGNOSIS — Z79899 Other long term (current) drug therapy: Secondary | ICD-10-CM | POA: Diagnosis not present

## 2021-05-09 DIAGNOSIS — I5033 Acute on chronic diastolic (congestive) heart failure: Secondary | ICD-10-CM | POA: Diagnosis not present

## 2021-05-09 DIAGNOSIS — R112 Nausea with vomiting, unspecified: Secondary | ICD-10-CM | POA: Diagnosis not present

## 2021-05-09 DIAGNOSIS — R0689 Other abnormalities of breathing: Secondary | ICD-10-CM | POA: Diagnosis not present

## 2021-05-09 DIAGNOSIS — J9621 Acute and chronic respiratory failure with hypoxia: Secondary | ICD-10-CM | POA: Diagnosis present

## 2021-05-09 DIAGNOSIS — R0902 Hypoxemia: Secondary | ICD-10-CM | POA: Diagnosis not present

## 2021-05-09 DIAGNOSIS — N2581 Secondary hyperparathyroidism of renal origin: Secondary | ICD-10-CM | POA: Diagnosis present

## 2021-05-09 DIAGNOSIS — I129 Hypertensive chronic kidney disease with stage 1 through stage 4 chronic kidney disease, or unspecified chronic kidney disease: Secondary | ICD-10-CM | POA: Diagnosis not present

## 2021-05-09 DIAGNOSIS — R0602 Shortness of breath: Secondary | ICD-10-CM | POA: Diagnosis not present

## 2021-05-09 DIAGNOSIS — R531 Weakness: Secondary | ICD-10-CM | POA: Diagnosis not present

## 2021-05-09 DIAGNOSIS — M109 Gout, unspecified: Secondary | ICD-10-CM | POA: Diagnosis present

## 2021-05-09 DIAGNOSIS — M25552 Pain in left hip: Secondary | ICD-10-CM | POA: Diagnosis not present

## 2021-05-09 DIAGNOSIS — M25561 Pain in right knee: Secondary | ICD-10-CM | POA: Diagnosis not present

## 2021-05-09 LAB — CBC WITH DIFFERENTIAL/PLATELET
Abs Immature Granulocytes: 0.04 10*3/uL (ref 0.00–0.07)
Basophils Absolute: 0 10*3/uL (ref 0.0–0.1)
Basophils Relative: 0 %
Eosinophils Absolute: 0.1 10*3/uL (ref 0.0–0.5)
Eosinophils Relative: 2 %
HCT: 33.8 % — ABNORMAL LOW (ref 39.0–52.0)
Hemoglobin: 11.2 g/dL — ABNORMAL LOW (ref 13.0–17.0)
Immature Granulocytes: 1 %
Lymphocytes Relative: 7 %
Lymphs Abs: 0.3 10*3/uL — ABNORMAL LOW (ref 0.7–4.0)
MCH: 31.9 pg (ref 26.0–34.0)
MCHC: 33.1 g/dL (ref 30.0–36.0)
MCV: 96.3 fL (ref 80.0–100.0)
Monocytes Absolute: 0.5 10*3/uL (ref 0.1–1.0)
Monocytes Relative: 12 %
Neutro Abs: 3.6 10*3/uL (ref 1.7–7.7)
Neutrophils Relative %: 78 %
Platelets: 178 10*3/uL (ref 150–400)
RBC: 3.51 MIL/uL — ABNORMAL LOW (ref 4.22–5.81)
RDW: 16.2 % — ABNORMAL HIGH (ref 11.5–15.5)
WBC: 4.5 10*3/uL (ref 4.0–10.5)
nRBC: 0 % (ref 0.0–0.2)

## 2021-05-09 LAB — COMPREHENSIVE METABOLIC PANEL
ALT: 22 U/L (ref 0–44)
AST: 20 U/L (ref 15–41)
Albumin: 3.4 g/dL — ABNORMAL LOW (ref 3.5–5.0)
Alkaline Phosphatase: 31 U/L — ABNORMAL LOW (ref 38–126)
Anion gap: 14 (ref 5–15)
BUN: 86 mg/dL — ABNORMAL HIGH (ref 6–20)
CO2: 27 mmol/L (ref 22–32)
Calcium: 9 mg/dL (ref 8.9–10.3)
Chloride: 93 mmol/L — ABNORMAL LOW (ref 98–111)
Creatinine, Ser: 11.34 mg/dL — ABNORMAL HIGH (ref 0.61–1.24)
GFR, Estimated: 5 mL/min — ABNORMAL LOW (ref >60)
Glucose, Bld: 89 mg/dL (ref 70–99)
Potassium: 6.2 mmol/L — ABNORMAL HIGH (ref 3.5–5.1)
Sodium: 134 mmol/L — ABNORMAL LOW (ref 135–145)
Total Bilirubin: 1.1 mg/dL (ref 0.3–1.2)
Total Protein: 6.7 g/dL (ref 6.5–8.1)

## 2021-05-09 LAB — BRAIN NATRIURETIC PEPTIDE: B Natriuretic Peptide: 2979 pg/mL — ABNORMAL HIGH (ref 0.0–100.0)

## 2021-05-09 LAB — TROPONIN I (HIGH SENSITIVITY)
Troponin I (High Sensitivity): 126 ng/L (ref <18)
Troponin I (High Sensitivity): 107 ng/L (ref <18)

## 2021-05-09 IMAGING — DX DG CHEST 2V SAME DAY
2 series · 2 of 2 positions shown · non-contrast
Comparison: Multiple prior chest radiographs, most recently earlier
today

CLINICAL DATA: Shortness of breath

EXAM:
CHEST - 2 VIEW SAME DAY

[chest lat]
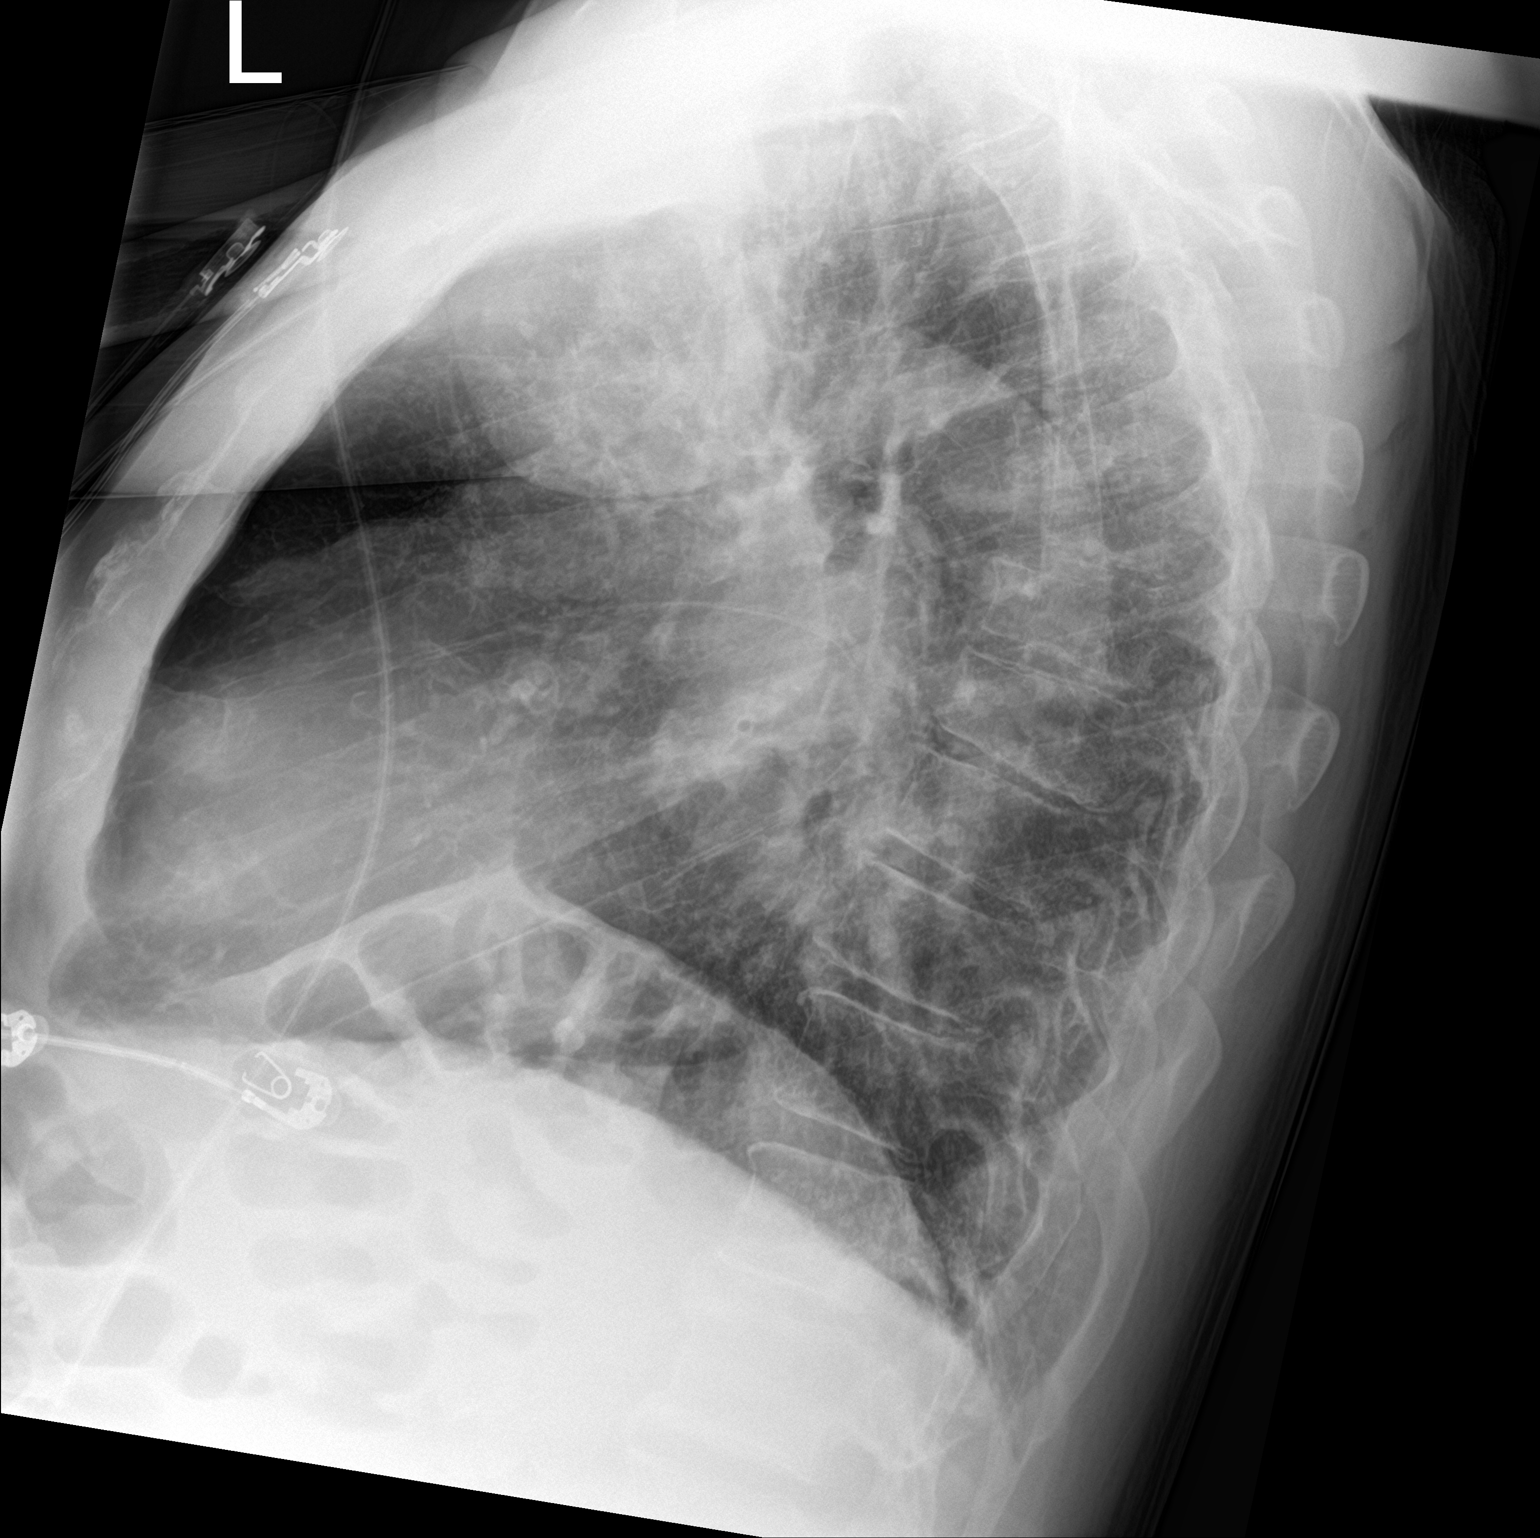

[chest ap]
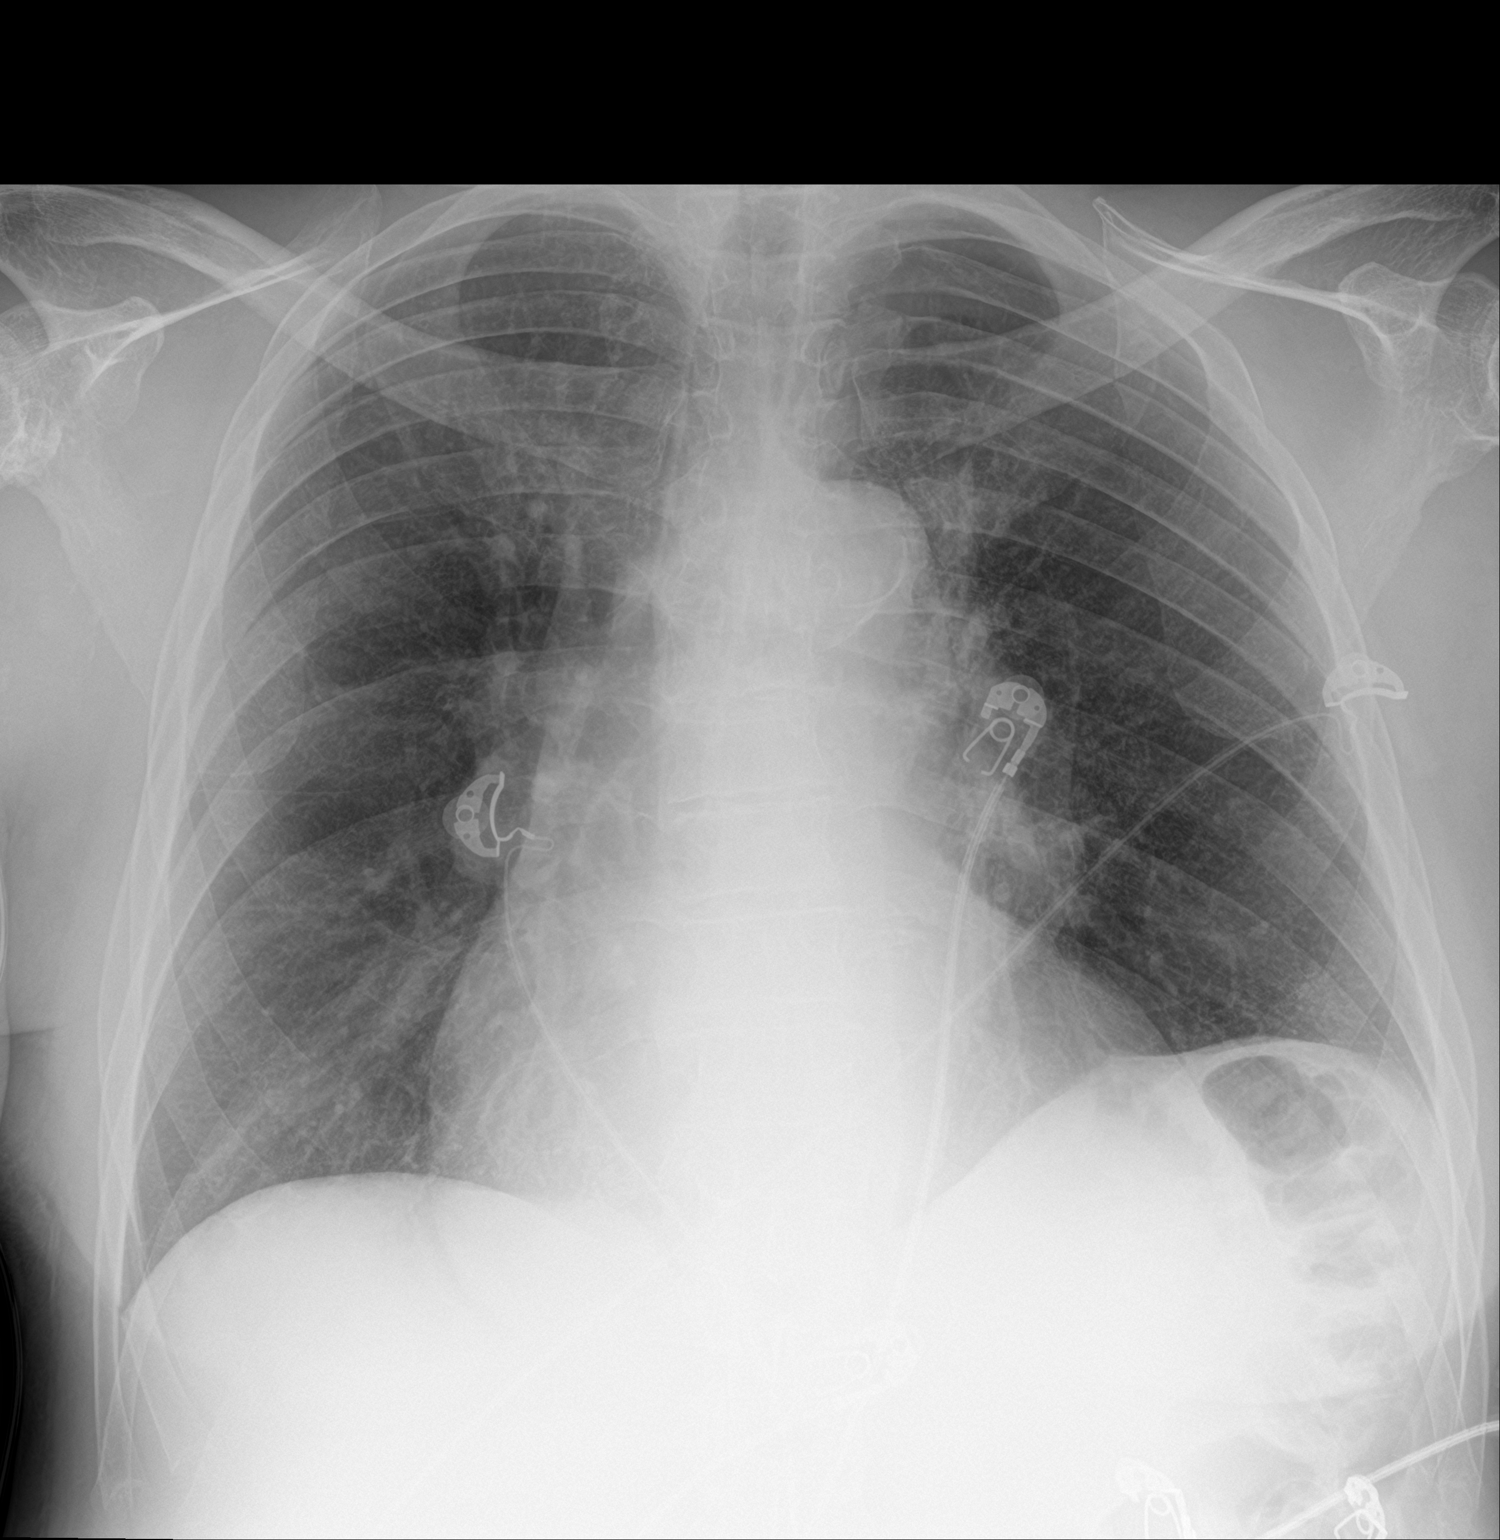

[2 of 2 positions shown; findings below may reference images not displayed]

FINDINGS: Chronic contour abnormality along the left lateral cardiomediastinal
border. This is similar dating back to initial chest radiograph on
[DATE]. On the current lateral view (as well as on priors), this
appears remote/distinct from the descending thoracic aorta.
Regardless, while the stability is reassuring, the exact etiology is
unclear, and warrants CT for further investigation.

Lungs are clear.  No pleural effusion or pneumothorax.

Mild cardiomegaly.  Thoracic aortic atherosclerosis.
IMPRESSION: Stable left mediastinal contour abnormality, grossly unchanged
dating back to [QK], but warranting CT evaluation. CT chest with
contrast is suggested.

## 2021-05-09 IMAGING — DX DG KNEE COMPLETE 4+V*L*
1 series · 4 of 4 positions shown · non-contrast
Comparison: None

CLINICAL DATA: RIGHT knee pain and swelling, hypoxemia, missed
dialysis today

EXAM:
LEFT KNEE - COMPLETE 4+ VIEW

[Series 1: knee · 0.14mm/px · 4 of 4 slices shown]
[im 1/4]
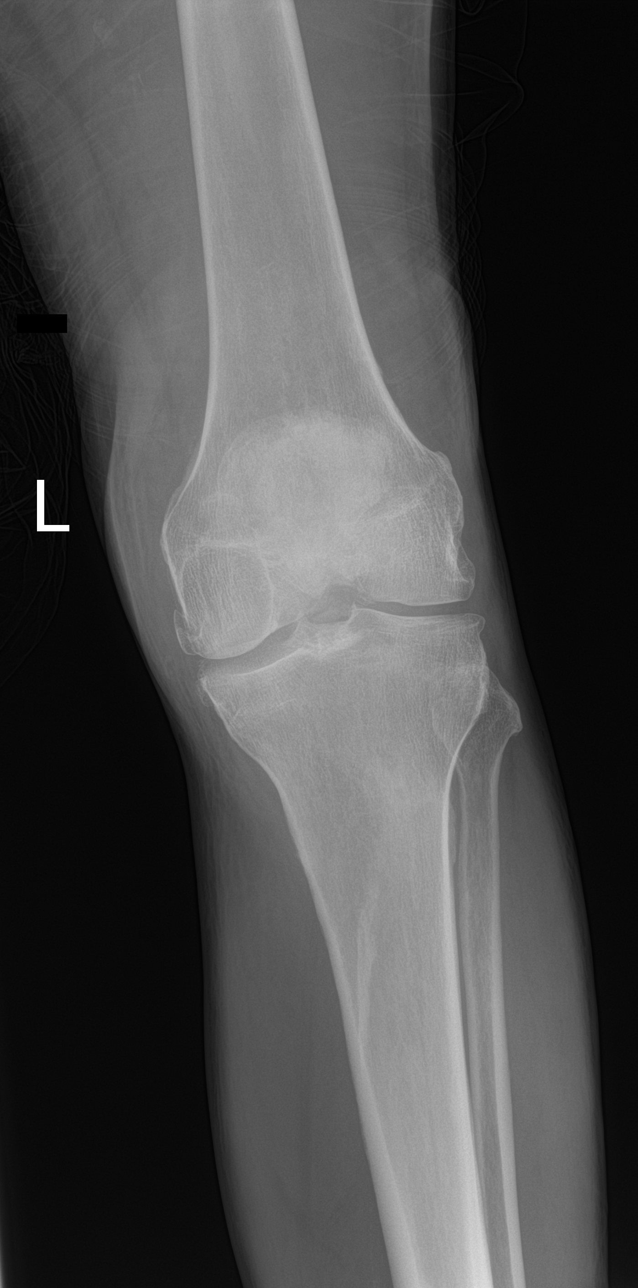
[im 2/4]
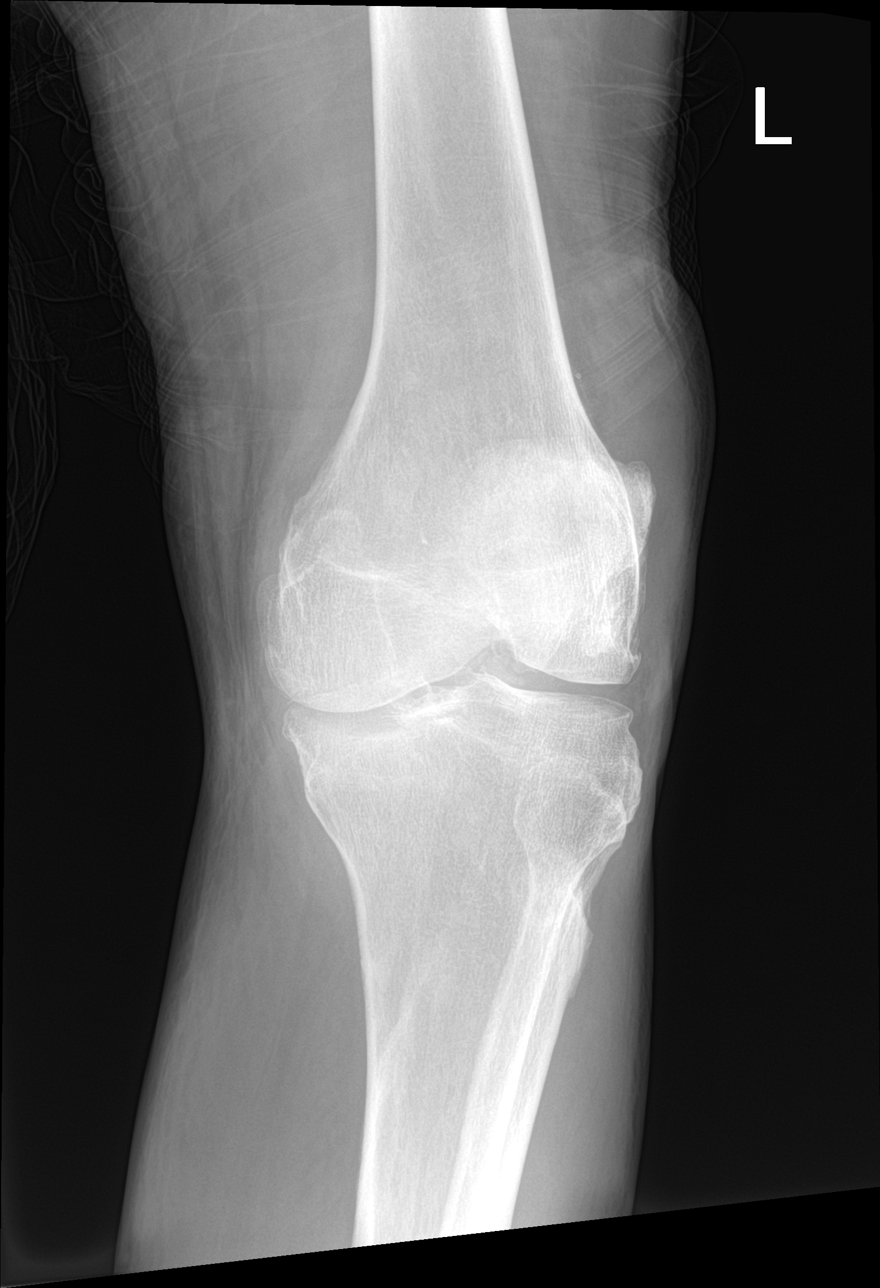
[im 3/4]
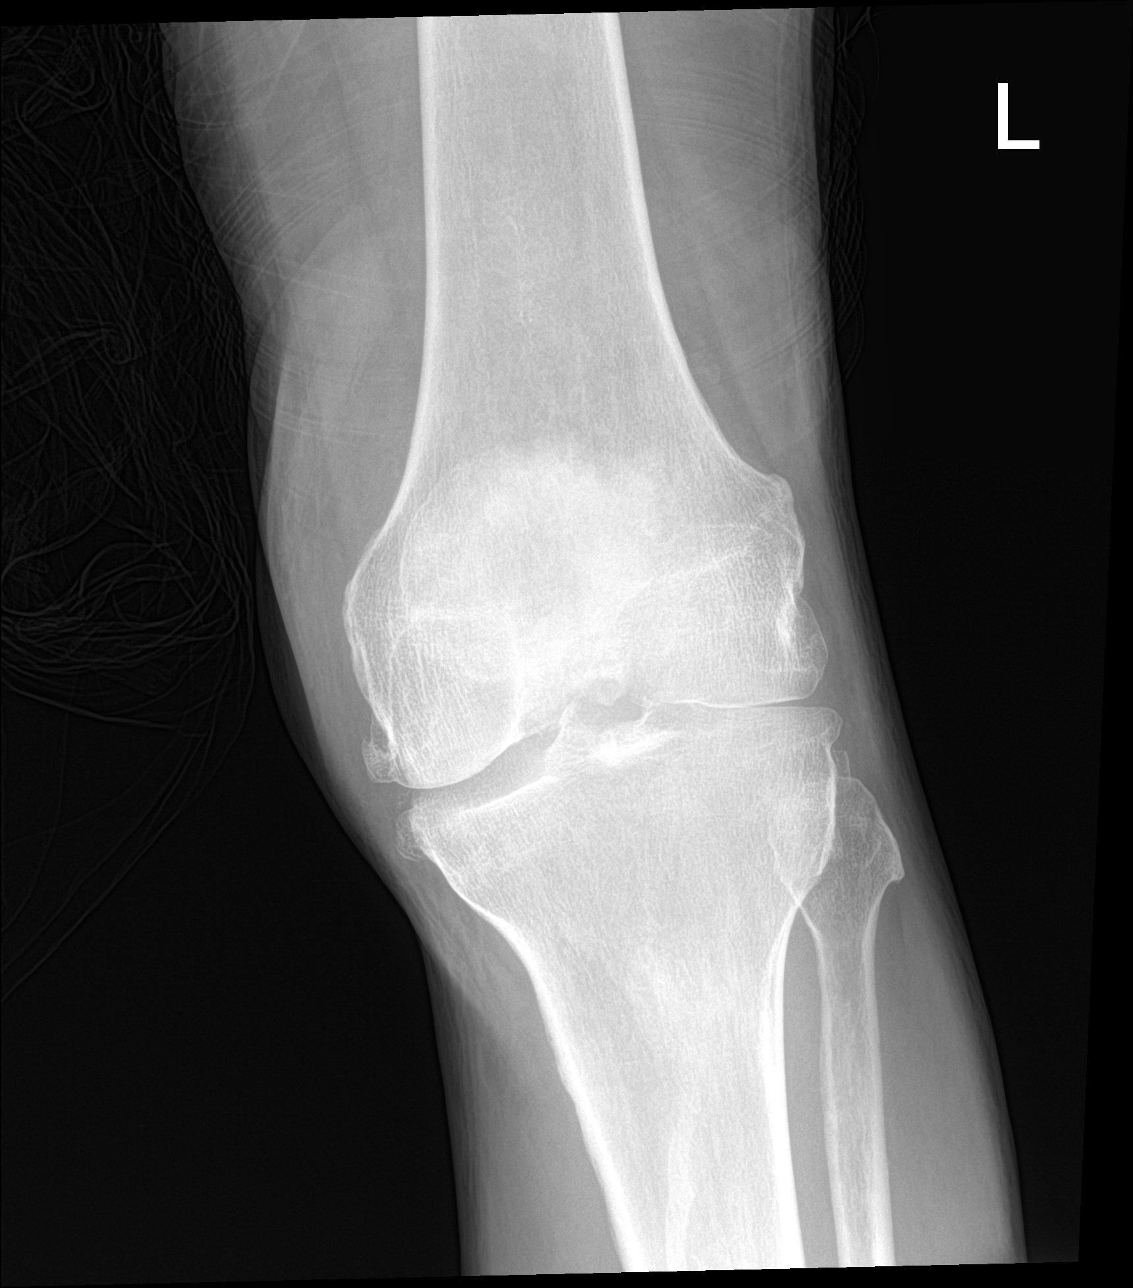
[im 4/4]
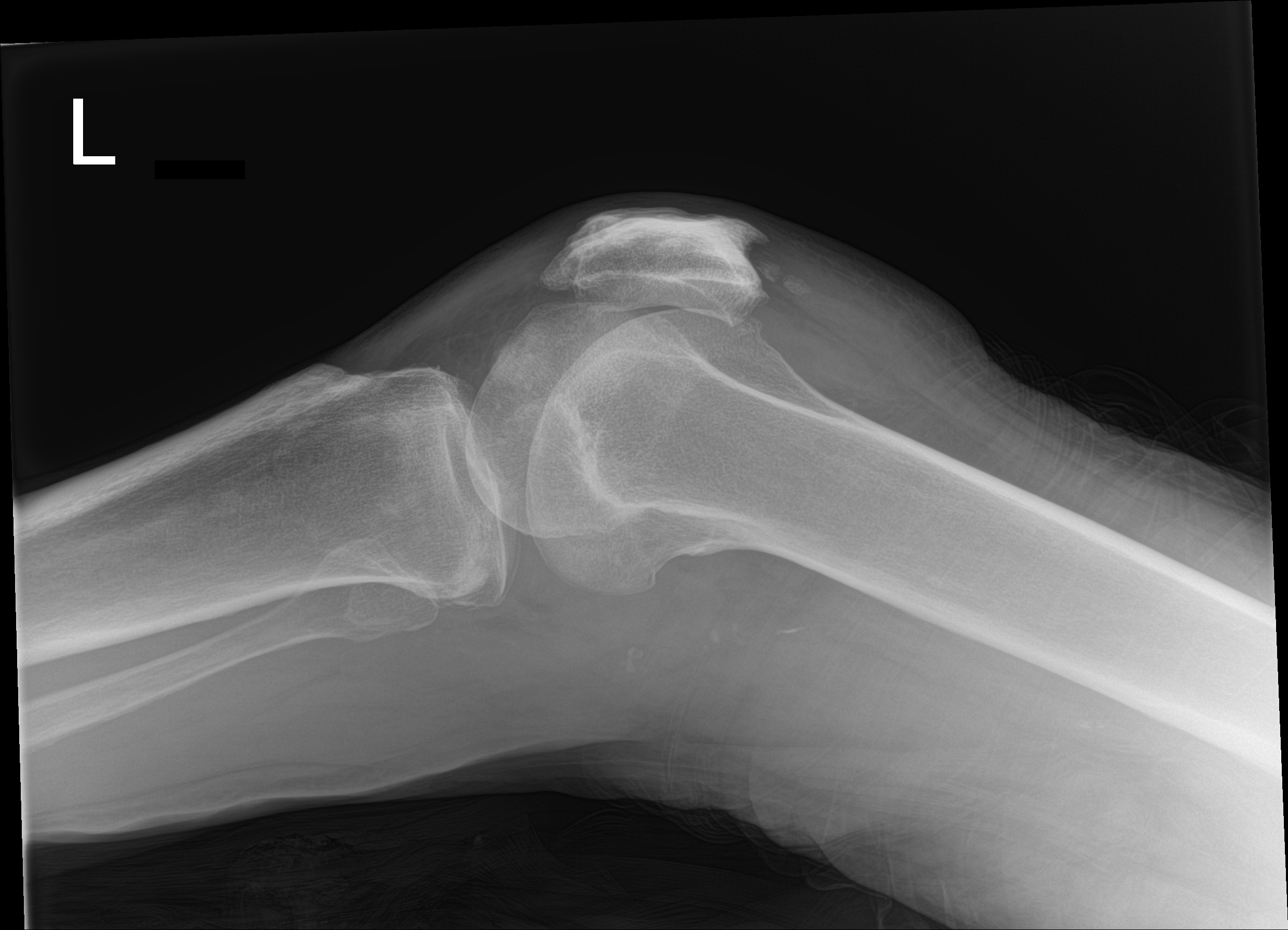

[4 of 4 positions shown; findings below may reference images not displayed]

FINDINGS: Osseous mineralization normal.

Joint space narrowing and spur formation at medial compartment.

Mild patellofemoral joint space narrowing.

No acute fracture, dislocation, or bone destruction.

Small joint effusion.
IMPRESSION: Degenerative changes LEFT knee with small joint effusion.

No acute osseous findings.

## 2021-05-09 IMAGING — DX DG CHEST 1V PORT
1 series · 1 of 1 positions shown · non-contrast
Comparison: [DATE]

CLINICAL DATA: Shortness of breath

EXAM:
PORTABLE CHEST 1 VIEW

[chest ap]
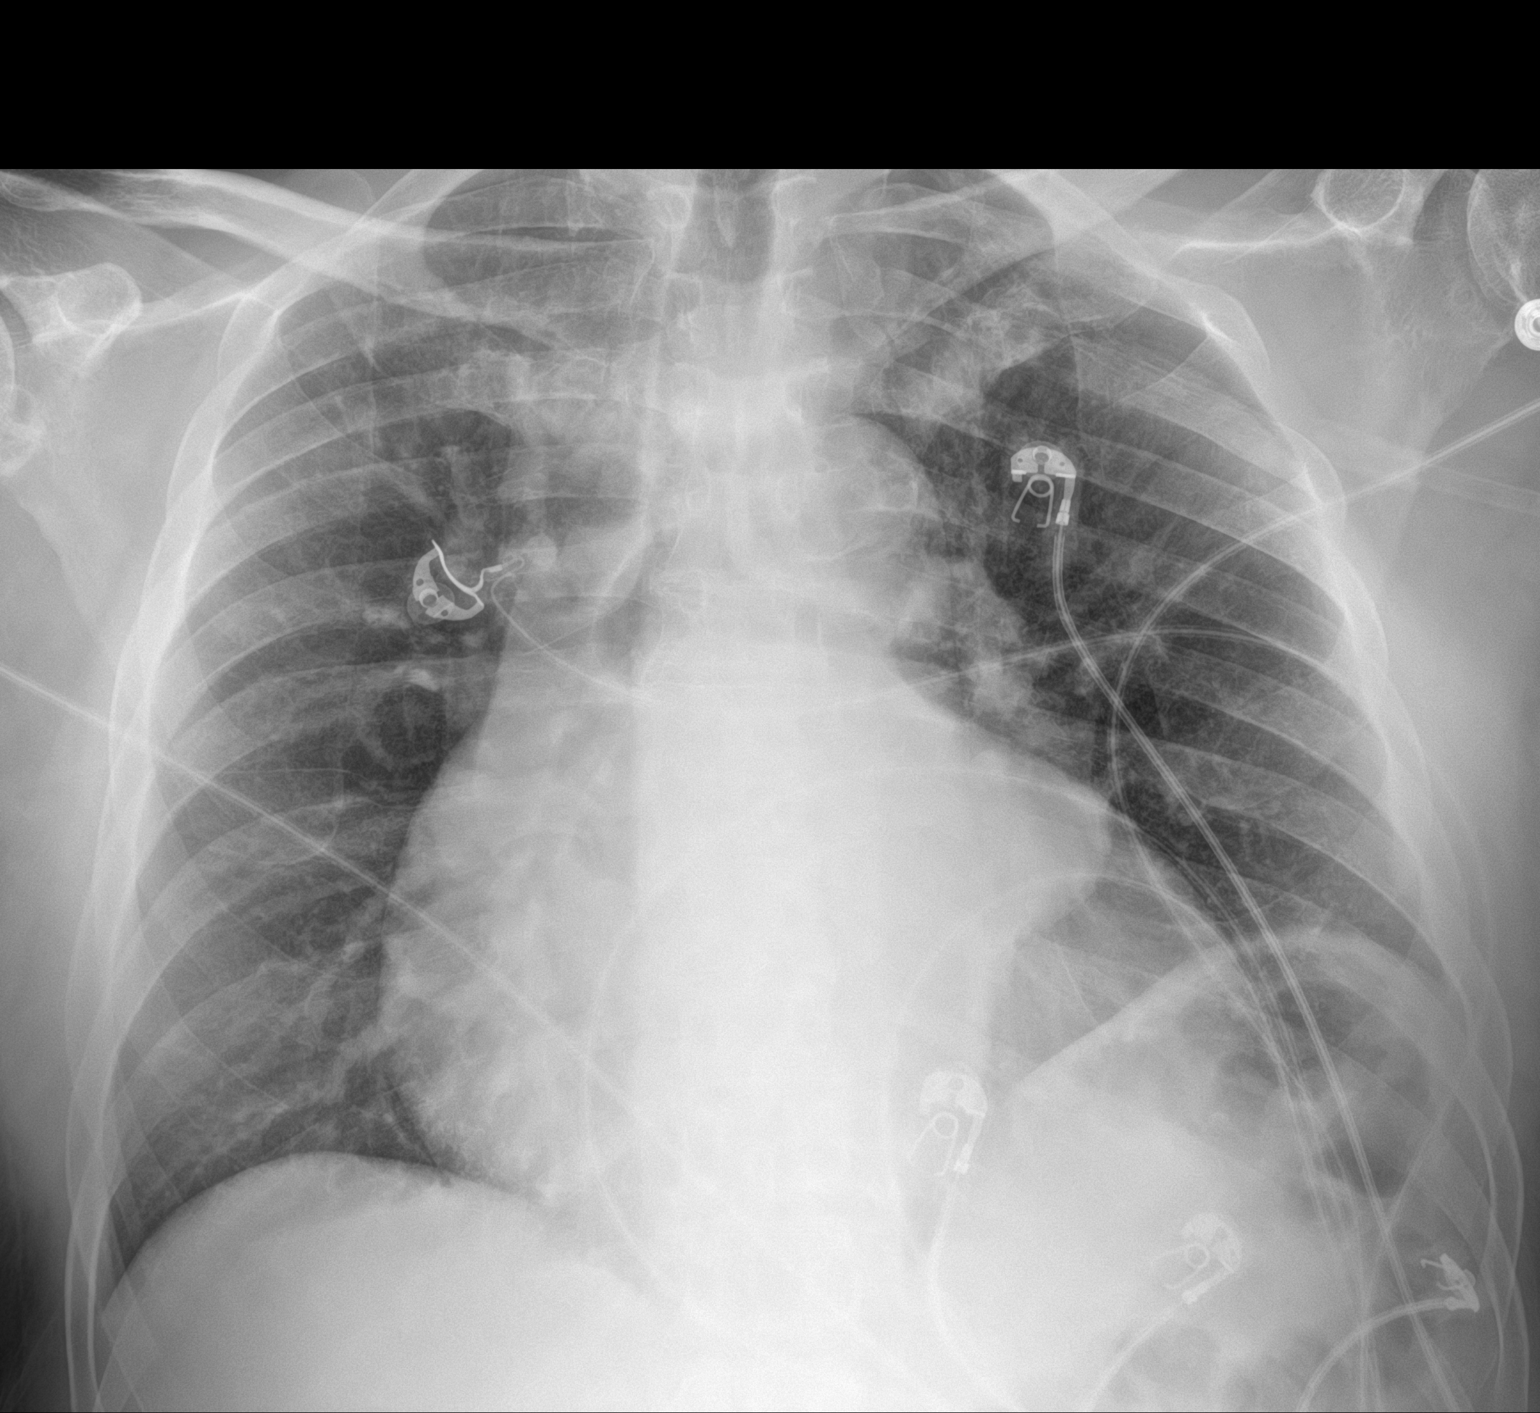

[1 of 1 positions shown; findings below may reference images not displayed]

FINDINGS: Transverse diameter of heart is increased. There is 4.4 cm focal
bulge with convexity to the left overlying the left hilum. There are
no signs of pulmonary edema or focal pulmonary consolidation. Left
hemidiaphragm is elevated. There is no pleural effusion or
pneumothorax.
IMPRESSION: Cardiomegaly. There are no signs of pulmonary edema or focal
pulmonary consolidation.

There is 4.4 cm smooth marginated structure with convex left margin
superimposed over the left hilum. This may be due to tortuosity or
aneurysmal dilation in the descending thoracic aorta. Follow-up PA
and lateral views of chest along with CT if warranted should be
considered.

## 2021-05-09 MED ORDER — ONDANSETRON HCL 4 MG/2ML IJ SOLN
4.0000 mg | Freq: Four times a day (QID) | INTRAMUSCULAR | Status: DC | PRN
Start: 2021-05-09 — End: 2021-05-11

## 2021-05-09 MED ORDER — HEPARIN SODIUM (PORCINE) 5000 UNIT/ML IJ SOLN
5000.0000 [IU] | Freq: Three times a day (TID) | INTRAMUSCULAR | Status: DC
  Administered 2021-05-09 – 2021-05-11 (×6): 5000 [IU] via SUBCUTANEOUS
  Filled 2021-05-09 (×6): qty 1

## 2021-05-09 MED ORDER — SODIUM ZIRCONIUM CYCLOSILICATE 5 G PO PACK
5.0000 g | PACK | Freq: Once | ORAL | Status: AC
  Administered 2021-05-09: 5 g via ORAL
  Filled 2021-05-09: qty 1

## 2021-05-09 MED ORDER — ACETAMINOPHEN 650 MG RE SUPP: 650.0000 mg | Freq: Four times a day (QID) | RECTAL | Status: DC | PRN

## 2021-05-09 MED ORDER — ONDANSETRON HCL 4 MG PO TABS: 4.0000 mg | ORAL_TABLET | Freq: Four times a day (QID) | ORAL | Status: DC | PRN

## 2021-05-09 MED ORDER — ACETAMINOPHEN 325 MG PO TABS
650.0000 mg | ORAL_TABLET | Freq: Four times a day (QID) | ORAL | Status: DC | PRN
  Administered 2021-05-10 (×2): 650 mg via ORAL
  Filled 2021-05-09 (×3): qty 2

## 2021-05-09 NOTE — ED Notes (Signed)
Phlebotomy called at this time to attempt to draw blood ordered on the patient ?

## 2021-05-09 NOTE — ED Notes (Signed)
Lab called to add on troponin to sent down labs by phlebotomy  ?

## 2021-05-09 NOTE — Assessment & Plan Note (Signed)
Getting the radiologist requested PA and lateral views. ?

## 2021-05-09 NOTE — ED Notes (Signed)
Provider at bedside at this time

## 2021-05-09 NOTE — Assessment & Plan Note (Addendum)
Getting dose of lokelma ?Tele monitor ?Plan dialysis later this evening or tomorrow AM ?

## 2021-05-09 NOTE — ED Notes (Signed)
Pt requesting something to eat and drink, per MD Horton we will wait until labs com back. Pt notified.  ?

## 2021-05-09 NOTE — Assessment & Plan Note (Signed)
Swelling, no erythema ?X ray neg for acute findings ?No systemic symptoms to suggest septic arthritis, joint not red ?1. Tylenol for pain for the moment ?

## 2021-05-09 NOTE — ED Notes (Signed)
Patient taken to XR at this time.

## 2021-05-09 NOTE — Assessment & Plan Note (Signed)
SW consult  

## 2021-05-09 NOTE — ED Notes (Signed)
Patient passed regular bowel movement and voided at this time. Patient was given wash cloth and the opportunity to clean up ?

## 2021-05-09 NOTE — Assessment & Plan Note (Signed)
Not in acute exacerbation at this time. ?

## 2021-05-09 NOTE — ED Notes (Signed)
This RN attempted to collect blood work via IV once and via straight stick twice with little success. Blood cultures sent to the lab in advance for holding.  ?

## 2021-05-09 NOTE — ED Notes (Signed)
Respirations of 3 have been documented in error by this RN; patients resting Respirations are 20 breaths a minuet ?

## 2021-05-09 NOTE — ED Notes (Signed)
Patient provided with meal and drinks at this time as requested ?

## 2021-05-09 NOTE — ED Triage Notes (Signed)
Per EMS patient called EMS for knee pain then shortly after the patient was found to have a room air sat of 77% and said to EMS that he missed his dialysis (T, TH, Sat) (he missed his Saturday dialysis. Patient comes In on 15 L on a non rebreather. Patient is alert and oriented. Patient was found by EMS in his car. Patient reports that he is homeless ?

## 2021-05-09 NOTE — ED Notes (Signed)
EKG completed by Jesus NT at the bedside at this time ?

## 2021-05-09 NOTE — ED Provider Notes (Signed)
?[place] ?Provider Note ? ? ?CSN: [id number] ?Arrival date & time: 05/09/21  1642 ? ?  ? ?History ? ?Chief Complaint  ?Patient presents with  ? Shortness of Breath  ? ? ?Jason Dowe. is a 61 y.o. male. ? ?HPI ? ?61 year old male with past medical history of ESRD on HD every TTS presents emergency department complaint of left knee pain and shortness of breath.  Patient states that his left knee was painful and started swelling last night.  Remote history of gout.  Denies any fever or injury to the knee.  Patient was noted to be hypoxic in route, arrived on a nonrebreather, down trended to nasal cannula.  Denies any acute chest pain.  States he missed his dialysis appointment today secondary to the knee pain.  Admits to homelessness.  Last dialysis session was Thursday, full session. ? ?Home Medications ?Prior to Admission medications   ?Medication Sig Start Date End Date Taking? Authorizing Provider  ?amLODipine (NORVASC) 10 MG tablet Take 10 mg by mouth every morning. 06/20/18   [provider]  ?carvedilol (COREG) 25 MG tablet Take 1 tablet (25 mg total) by mouth 2 (two) times daily with a meal. 02/02/18   [name], FNP  ?lanthanum (FOSRENOL) 1000 MG chewable tablet Chew 1,000 mg by mouth 3 (three) times daily with meals.    [provider]  ?methocarbamol (ROBAXIN) 500 MG tablet Take 1 tablet (500 mg total) by mouth 2 (two) times daily as needed for muscle spasms. ?Patient not taking: Reported on 03/15/2021 02/08/21   Caccavale, Sophia, PA-C  ?multivitamin (RENA-VIT) TABS tablet Take 1 tablet by mouth every morning. 10/20/17   [provider]  ?oxyCODONE-acetaminophen (PERCOCET/ROXICET) 5-325 MG tablet Take 1 tablet by mouth every 12 (twelve) hours as needed for up to 12 doses for severe pain. 02/28/21   Wyvonnia Dusky, MD  ?polyvinyl alcohol (LIQUIFILM TEARS) 1.4 % ophthalmic solution Place 1 drop into both eyes daily as needed for dry  eyes.    [provider]  ?   ? ?Allergies    ?Lisinopril   ? ?Review of Systems   ?Review of Systems  ?Constitutional:  Positive for fatigue. Negative for fever.  ?Respiratory:  Positive for shortness of breath.   ?Cardiovascular:  Negative for chest pain.  ?Gastrointestinal:  Negative for abdominal pain, diarrhea and vomiting.  ?Musculoskeletal:   ?     + Left knee pain  ?Skin:  Negative for rash.  ?Neurological:  Negative for headaches.  ? ?Physical Exam ?Updated Vital Signs ?BP 128/86   Pulse 69   Temp 98.6 ?F (37 ?C) (Oral)   Resp (!) 22   Ht '6\' 2"'$  (1.88 m)   Wt 85.7 kg   SpO2 97%   BMI 24.27 kg/m?  ?Physical Exam ? ?ED Results / Procedures / Treatments   ?Labs ?(all labs ordered are listed, but only abnormal results are displayed) ?Labs Reviewed  ?CBC WITH DIFFERENTIAL/PLATELET - Abnormal; Notable for the following components:  ?    Result Value  ? RBC 3.51 (*)   ? Hemoglobin 11.2 (*)   ? HCT 33.8 (*)   ? RDW 16.2 (*)   ? Lymphs Abs 0.3 (*)   ? All other components within normal limits  ?COMPREHENSIVE METABOLIC PANEL - Abnormal; Notable for the following components:  ? Sodium 134 (*)   ? Potassium 6.2 (*)   ? Chloride 93 (*)   ? BUN 86 (*)   ?  Creatinine, Ser 11.34 (*)   ? Albumin 3.4 (*)   ? Alkaline Phosphatase 31 (*)   ? GFR, Estimated 5 (*)   ? All other components within normal limits  ?BRAIN NATRIURETIC PEPTIDE - Abnormal; Notable for the following components:  ? B Natriuretic Peptide 2,979.0 (*)   ? All other components within normal limits  ?TROPONIN I (HIGH SENSITIVITY) - Abnormal; Notable for the following components:  ? Troponin I (High Sensitivity) 126 (*)   ? All other components within normal limits  ?TROPONIN I (HIGH SENSITIVITY) - Abnormal; Notable for the following components:  ? Troponin I (High Sensitivity) 107 (*)   ? All other components within normal limits  ? ? ?EKG ?EKG Interpretation ? ?Date/Time:  Saturday May 09 2021 17:03:04 EDT ?Ventricular Rate:  72 ?PR  Interval:  162 ?QRS Duration: 115 ?QT Interval:  414 ?QTC Calculation: 591 ?R Axis:   263 ?Text Interpretation: Sinus rhythm Incomplete RBBB and LAFB Consider left ventricular hypertrophy Confirmed by Lavenia Atlas [phone]) on 05/09/2021 5:56:50 PM ? ?Radiology ?DG Chest Port 1 View ? ?Result Date: 05/09/2021 ?CLINICAL DATA:  Shortness of breath EXAM: PORTABLE CHEST 1 VIEW COMPARISON:  03/15/2021 FINDINGS: Transverse diameter of heart is increased. There is 4.4 cm focal bulge with convexity to the left overlying the left hilum. There are no signs of pulmonary edema or focal pulmonary consolidation. Left hemidiaphragm is elevated. There is no pleural effusion or pneumothorax. IMPRESSION: Cardiomegaly. There are no signs of pulmonary edema or focal pulmonary consolidation. There is 4.4 cm smooth marginated structure with convex left margin superimposed over the left hilum. This may be due to tortuosity or aneurysmal dilation in the descending thoracic aorta. Follow-up PA and lateral views of chest along with CT if warranted should be considered. Electronically Signed   By: Elmer Picker M.D.   On: 05/09/2021 18:49  ? ?DG Knee Complete 4 Views Left ? ?Result Date: 05/09/2021 ?CLINICAL DATA:  RIGHT knee pain and swelling, hypoxemia, missed dialysis today EXAM: LEFT KNEE - COMPLETE 4+ VIEW COMPARISON:  None FINDINGS: Osseous mineralization normal. Joint space narrowing and spur formation at medial compartment. Mild patellofemoral joint space narrowing. No acute fracture, dislocation, or bone destruction. Small joint effusion. IMPRESSION: Degenerative changes LEFT knee with small joint effusion. No acute osseous findings. Electronically Signed   By: Lavonia Dana M.D.   On: 05/09/2021 19:18   ? ?Procedures ?Marland KitchenCritical Care ?Performed by: Lorelle Gibbs, DO ?Authorized by: Lorelle Gibbs, DO  ? ?Critical care provider statement:  ?  Critical care time (minutes):  45 ?  Critical care was necessary to treat or prevent  imminent or life-threatening deterioration of the following conditions:  Metabolic crisis and renal failure ?  Critical care was time spent personally by me on the following activities:  Development of treatment plan with patient or surrogate, discussions with consultants, evaluation of patient's response to treatment, examination of patient, ordering and review of laboratory studies, ordering and review of radiographic studies, ordering and performing treatments and interventions, pulse oximetry, re-evaluation of patient's condition and review of old charts ?  I assumed direction of critical care for this patient from another provider in my specialty: no   ?  Care discussed with: admitting provider    ? ? ?Medications Ordered in ED ?Medications  ?sodium zirconium cyclosilicate (LOKELMA) packet 5 g (has no administration in time range)  ? ? ?ED Course/ Medical Decision Making/ A&P ?  ?                        ?  Medical Decision Making ?Amount and/or Complexity of Data Reviewed ?Labs: ordered. ?Radiology: ordered. ? ?Risk ?Prescription drug management. ? ? ?This patient presents to the ED for concern of left knee pain, shortness of breath/hypoxia, this involves an extensive number of treatment options, and is a complaint that carries with it a high risk of complications and morbidity.  The differential diagnosis includes knee pain, degenerative changes, septic arthritis, end-stage renal disease, pulmonary edema, dialysis ? ? ?Additional history obtained: ?-External records from outside source obtained and reviewed including: Chart review including previous notes, labs, imaging, consultation notes ? ? ?Lab Tests: ?-I ordered, reviewed, and interpreted labs.  The pertinent results include: Baseline renal dysfunction, hyperkalemia of 6.2 without hemolysis, baseline cardiac enzymes ? ? ?EKG ?-No peaked T waves or acute changes when compared to previous ? ? ?Imaging Studies ordered: ?-I ordered imaging studies including  chest x-ray and knee x-ray ?-I independently visualized and interpreted imaging which showed baseline cardiomegaly, degenerative knee changes with joint effusion ?-I agree with the radiologist interpretation ? ? ?[place]

## 2021-05-09 NOTE — ED Notes (Signed)
Shortly after taken off o2 patient's room air sat is 78%; 10L of oxygen applied with non rebreather mask. Patient reports no feelings of SHOB or weakness at this time ?

## 2021-05-09 NOTE — H&P (Signed)
?History and Physical  ? ? ?Patient: Jason [name]. OEV:[id number] DOB: 02-Nov-1960 ?DOA: 05/09/2021 ?DOS: the patient was seen and examined on 05/09/2021 ?PCP: Pcp, No  ?Patient coming from: Homeless ? ?Chief Complaint:  ?Chief Complaint  ?Patient presents with  ? Shortness of Breath  ? ?HPI: Jason [name] is a 61 y.o. male with medical history significant of ESRD on TTS dialysis, dCHF, homelessness. ? ?Pt presents to ED for L knee pain and SOB. ? ?L knee pain onset last night.  Remote h/o gout.  No fever, no injury to knee. ? ?Pt hypoxic en route, though down trended to [place] and eventually RA. ? ?No CP. ? ?Pt missed HD earlier today, says due to knee pain. ? ?Last dialysis on Thurs, he states full session then. ? ?  ?Review of Systems: As mentioned in the history of present illness. All other systems reviewed and are negative. ?Past Medical History:  ?Diagnosis Date  ? A-V fistula ([place])   ? Right arm  ? Abdominal hernia   ? AKI (acute kidney injury) ([place]) 01/2015  ? Anasarca   ? Anemia   ? Asthma   ? as a child  ? Bilateral inguinal hernia   ? Bilateral pleural effusion 01/2017  ? Cellulitis 01/16/2017  ? Bilateral lower extremity  ? CHF (congestive heart failure) ([place])   ? Chronic venous insufficiency   ? Concentric left ventricular hypertrophy 08/17/2017  ? Moderated, noted on ECHO  ? Diastolic dysfunction 82/99/3716  ? noted on ECHO  ? Elevated LFTs   ? per patient, resolved  ? End stage renal disease ([place])   ? Dialysis T/Th/Sa  Started 02/2017/pt is waiting for a kidney transplant  ? H/O right inguinal hernia repair 11/2017  ? History of colonoscopy 03/20/2018  ? History of elevated PSA 10/2017  ? Hypertension   ? Pneumonia   ? Pulmonary hypertension ([place])   ? Moderate  ? Venous stasis ulcer ([place])   ? bil legs/ healed up now per pt (03/06/18)  ? ?Past Surgical History:  ?Procedure Laterality Date  ? AV FISTULA PLACEMENT Right 01/20/2017  ? Procedure: ARTERIOVENOUS (AV) FISTULA CREATION;  Surgeon: Angelia Mould, MD;  Location: [place];  Service: Vascular;  Laterality: Right;  ? HERNIA REPAIR    ? INSERTION OF DIALYSIS CATHETER Right 01/20/2017  ? Procedure: INSERTION OF DIALYSIS CATHETER;  Surgeon: Angelia Mould, MD;  Location: [place];  Service: Vascular;  Laterality: Right;  ? INSERTION OF MESH N/A 11/18/2017  ? Procedure: INSERTION OF MESH;  Surgeon: Michael Boston, MD;  Location: [facility];  Service: General;  Laterality: N/A;  ? IR FLUORO GUIDE CV LINE RIGHT  01/18/2017  ? IR LUMBAR DISC ASPIRATION W/IMG GUIDE  07/21/2020  ? IR US GUIDE VASC ACCESS RIGHT  01/18/2017  ? LAPAROSCOPIC INGUINAL HERNIA WITH UMBILICAL HERNIA Bilateral 11/18/2017  ? Procedure: LAPAROSCOPIC BILATERAL INGUINAL HERNIA REPAIR WITH UMBILICAL HERNIA REPAIR WITH MESH;  Surgeon: Michael Boston, MD;  Location: [facility];  Service: General;  Laterality: Bilateral;  ? TOE SURGERY    ? right fracture in big toe  ? TONSILLECTOMY    ? ?Social History:  reports that he quit smoking about 26 years ago. He has never used smokeless tobacco. He reports that he does not currently use alcohol. He reports that he does not currently use drugs. ? ?Allergies  ?Allergen Reactions  ? Lisinopril Cough  ? ? ?Family History  ?Problem Relation Age of Onset  ?  Heart attack Mother 13  ?     CABG hx  ? Heart disease Mother   ? Hypertension Mother   ? Stroke Mother   ? Diabetes Father   ? Healthy Brother   ? Prostate cancer Maternal Grandfather   ? Colon cancer Neg Hx   ? Colon polyps Neg Hx   ? Esophageal cancer Neg Hx   ? Stomach cancer Neg Hx   ? Rectal cancer Neg Hx   ? ? ?Prior to Admission medications   ?Medication Sig Start Date End Date Taking? Authorizing Provider  ?amLODipine (NORVASC) 10 MG tablet Take 10 mg by mouth every morning. 06/20/18   [provider]  ?carvedilol (COREG) 25 MG tablet Take 1 tablet (25 mg total) by mouth 2 (two) times daily with a meal. 02/02/18   [name], FNP  ?lanthanum (FOSRENOL) 1000 MG chewable tablet Chew 1,000 mg by  mouth 3 (three) times daily with meals.    [provider]  ?methocarbamol (ROBAXIN) 500 MG tablet Take 1 tablet (500 mg total) by mouth 2 (two) times daily as needed for muscle spasms. ?Patient not taking: Reported on 03/15/2021 02/08/21   Caccavale, Sophia, PA-C  ?multivitamin (RENA-VIT) TABS tablet Take 1 tablet by mouth every morning. 10/20/17   [provider]  ?oxyCODONE-acetaminophen (PERCOCET/ROXICET) 5-325 MG tablet Take 1 tablet by mouth every 12 (twelve) hours as needed for up to 12 doses for severe pain. 02/28/21   Wyvonnia Dusky, MD  ?polyvinyl alcohol (LIQUIFILM TEARS) 1.4 % ophthalmic solution Place 1 drop into both eyes daily as needed for dry eyes.    [provider]  ? ? ?Physical Exam: ?Vitals:  ? 05/09/21 1930 05/09/21 1945 05/09/21 2015 05/09/21 2030  ?BP: (!) 163/98 (!) 154/95 107/79 128/86  ?Pulse:  72 66 69  ?Resp: '17 15 16 '$ (!) 22  ?Temp:      ?TempSrc:      ?SpO2:  96% 98% 97%  ?Weight:      ?Height:      ? ?Constitutional: NAD, calm, comfortable ?Eyes: PERRL, lids and conjunctivae normal ?ENMT: Mucous membranes are moist. Posterior pharynx clear of any exudate or lesions.Normal dentition.  ?Neck: normal, supple, no masses, no thyromegaly ?Respiratory: clear to auscultation bilaterally, no wheezing, no crackles. Normal respiratory effort. No accessory muscle use.  ?Cardiovascular: Regular rate and rhythm, no murmurs / rubs / gallops. No extremity edema. 2+ pedal pulses. No carotid bruits.  ?Abdomen: no tenderness, no masses palpated. No hepatosplenomegaly. Bowel sounds positive.  ?Musculoskeletal: L knee swelling, no erythema ?Skin: no rashes, lesions, ulcers. No induration ?Neurologic: CN 2-12 grossly intact. Sensation intact, DTR normal. Strength 5/5 in all 4.  ?Psychiatric: Normal judgment and insight. Alert and oriented x 3. Normal mood.  ? ?Data Reviewed: ? ?  ? ?  Latest Ref Rng & Units 05/09/2021  ?  5:57 PM 03/15/2021  ?  2:01 PM 03/15/2021  ?  1:48 PM  ?BMP   ?Glucose 70 - 99 mg/dL 89   89   92    ?BUN 6 - 20 mg/dL 86   >130   135    ?Creatinine 0.61 - 1.24 mg/dL 11.34   >18.00   16.70    ?Sodium 135 - 145 mmol/L 134   130   134    ?Potassium 3.5 - 5.1 mmol/L 6.2   8.0   >7.5    ?Chloride 98 - 111 mmol/L 93   99   96    ?  CO2 22 - 32 mmol/L 27    18    ?Calcium 8.9 - 10.3 mg/dL 9.0    9.0    ? ?CXR = Cardiomegaly but no edema nor consolidation.  4.4cm bulge, may be due to tortuosity of aorta, consider follow up PA and lateral views. ? ? ?Assessment and Plan: ?* Non-compliance with renal dialysis ?Missed dialysis on Sat ?K 6.2 today ?Admitting for obs per nephro ?Plan = dialysis either later tonight or tomorrow AM ?Lokelma dose for K ?Tele monitor ? ?Hyperkalemia ?Getting dose of lokelma ?Tele monitor ?Plan dialysis later this evening or tomorrow AM ? ?Abnormal CXR ?Getting the radiologist requested PA and lateral views. ? ?Left knee pain ?Swelling, no erythema ?X ray neg for acute findings ?No systemic symptoms to suggest septic arthritis, joint not red ?Tylenol for pain for the moment ? ?ESRD (end stage renal disease) on dialysis [facility]) ?Normally dialyzed TTS, missed sat. ? ?Homelessness ?SW consult ? ?Chronic diastolic CHF (congestive heart failure) ([place]) ?Not in acute exacerbation at this time. ? ? ? ? ? Advance Care Planning:   Code Status: Full Code ? ?Consults: EDP spoke with Nephrology ? ?Family Communication: No family in room ? ?Severity of Illness: ?The appropriate patient status for this patient is OBSERVATION. Observation status is judged to be reasonable and necessary in order to provide the required intensity of service to ensure the patient's safety. The patient's presenting symptoms, physical exam findings, and initial radiographic and laboratory data in the context of their medical condition is felt to place them at decreased risk for further clinical deterioration. Furthermore, it is anticipated that the patient will be medically stable for discharge  from the hospital within 2 midnights of admission.  ? ?Author: ?Etta Quill., DO ?05/09/2021 9:12 PM ? ?For on call review [email or web address].  ?

## 2021-05-09 NOTE — Assessment & Plan Note (Signed)
Normally dialyzed TTS, missed sat. ?

## 2021-05-09 NOTE — ED Notes (Signed)
Provider made aware via secure chat of the patient's oxygen demands and hypertension via secure chat ?

## 2021-05-09 NOTE — Assessment & Plan Note (Addendum)
Missed dialysis on Sat ?K 6.2 today ?1. Admitting for obs per nephro ?2. Plan = dialysis either later tonight or tomorrow AM ?3. Lokelma dose for K ?4. Tele monitor ?

## 2021-05-09 NOTE — ED Notes (Signed)
Patient currently sating 96% on room air after moving himself over onto the hospital strecher ?

## 2021-05-09 NOTE — ED Notes (Signed)
Patient assisted to the bathroom at this time via wheelchair and portable oxygen; patient reports that he understands how to use the call bell in the restroom. Wheelchair left in the restroom with the patient. Patient reports that he will call if he needs help ?

## 2021-05-09 NOTE — ED Notes (Signed)
This RN called lab at this time and spoke with Lyn who reports that the labs are in process. ?

## 2021-05-09 NOTE — ED Notes (Signed)
This RN attempting to call lab back to ensure that the patients labs are being tested/ are in process.  ?

## 2021-05-09 NOTE — ED Notes (Signed)
Medication requested from pharmacy at this time 

## 2021-05-09 NOTE — ED Notes (Signed)
Patient reports that he has to use the restroom but is unwilling to go to the beside commode. This RN to get a wheelchair and portable oxygen to take patient to the restroom as he wishes ?

## 2021-05-10 DIAGNOSIS — Z992 Dependence on renal dialysis: Secondary | ICD-10-CM | POA: Diagnosis not present

## 2021-05-10 DIAGNOSIS — N186 End stage renal disease: Secondary | ICD-10-CM | POA: Diagnosis not present

## 2021-05-10 DIAGNOSIS — Z91158 Patient's noncompliance with renal dialysis for other reason: Secondary | ICD-10-CM | POA: Diagnosis not present

## 2021-05-10 DIAGNOSIS — I129 Hypertensive chronic kidney disease with stage 1 through stage 4 chronic kidney disease, or unspecified chronic kidney disease: Secondary | ICD-10-CM | POA: Diagnosis not present

## 2021-05-10 LAB — BASIC METABOLIC PANEL
Anion gap: 16 — ABNORMAL HIGH (ref 5–15)
BUN: 97 mg/dL — ABNORMAL HIGH (ref 6–20)
CO2: 24 mmol/L (ref 22–32)
Calcium: 8.6 mg/dL — ABNORMAL LOW (ref 8.9–10.3)
Chloride: 95 mmol/L — ABNORMAL LOW (ref 98–111)
Creatinine, Ser: 11.74 mg/dL — ABNORMAL HIGH (ref 0.61–1.24)
GFR, Estimated: 5 mL/min — ABNORMAL LOW (ref >60)
Glucose, Bld: 113 mg/dL — ABNORMAL HIGH (ref 70–99)
Potassium: 5.4 mmol/L — ABNORMAL HIGH (ref 3.5–5.1)
Sodium: 135 mmol/L (ref 135–145)

## 2021-05-10 LAB — CBC
HCT: 30.1 % — ABNORMAL LOW (ref 39.0–52.0)
Hemoglobin: 9.9 g/dL — ABNORMAL LOW (ref 13.0–17.0)
MCH: 31.1 pg (ref 26.0–34.0)
MCHC: 32.9 g/dL (ref 30.0–36.0)
MCV: 94.7 fL (ref 80.0–100.0)
Platelets: 161 10*3/uL (ref 150–400)
RBC: 3.18 MIL/uL — ABNORMAL LOW (ref 4.22–5.81)
RDW: 15.7 % — ABNORMAL HIGH (ref 11.5–15.5)
WBC: 4 10*3/uL (ref 4.0–10.5)
nRBC: 0 % (ref 0.0–0.2)

## 2021-05-10 MED ORDER — METHOCARBAMOL 500 MG PO TABS
500.0000 mg | ORAL_TABLET | Freq: Two times a day (BID) | ORAL | Status: DC | PRN
  Administered 2021-05-10 – 2021-05-11 (×2): 500 mg via ORAL
  Filled 2021-05-10 (×2): qty 1

## 2021-05-10 MED ORDER — SEVELAMER CARBONATE 800 MG PO TABS
2400.0000 mg | ORAL_TABLET | Freq: Three times a day (TID) | ORAL | Status: DC
  Administered 2021-05-10 – 2021-05-11 (×2): 2400 mg via ORAL
  Filled 2021-05-10 (×2): qty 3

## 2021-05-10 MED ORDER — DOXERCALCIFEROL 4 MCG/2ML IV SOLN: 8.0000 ug | INTRAVENOUS | Status: DC

## 2021-05-10 MED ORDER — POLYVINYL ALCOHOL 1.4 % OP SOLN
1.0000 [drp] | Freq: Every day | OPHTHALMIC | Status: DC | PRN
  Filled 2021-05-10: qty 15

## 2021-05-10 MED ORDER — SODIUM ZIRCONIUM CYCLOSILICATE 10 G PO PACK
10.0000 g | PACK | Freq: Once | ORAL | Status: AC
  Administered 2021-05-10: 10 g via ORAL
  Filled 2021-05-10: qty 1

## 2021-05-10 MED ORDER — RENA-VITE PO TABS
1.0000 | ORAL_TABLET | Freq: Every morning | ORAL | Status: DC
  Administered 2021-05-11: 1 via ORAL
  Filled 2021-05-10: qty 1

## 2021-05-10 MED ORDER — AMLODIPINE BESYLATE 10 MG PO TABS
10.0000 mg | ORAL_TABLET | Freq: Every morning | ORAL | Status: DC
  Administered 2021-05-10 – 2021-05-11 (×2): 10 mg via ORAL
  Filled 2021-05-10 (×2): qty 1

## 2021-05-10 MED ORDER — CARVEDILOL 25 MG PO TABS
25.0000 mg | ORAL_TABLET | Freq: Two times a day (BID) | ORAL | Status: DC
  Administered 2021-05-10 – 2021-05-11 (×2): 25 mg via ORAL
  Filled 2021-05-10 (×2): qty 1

## 2021-05-10 MED ORDER — OXYCODONE-ACETAMINOPHEN 5-325 MG PO TABS
1.0000 | ORAL_TABLET | Freq: Two times a day (BID) | ORAL | Status: DC | PRN
Start: 2021-05-10 — End: 2021-05-11
  Administered 2021-05-10 – 2021-05-11 (×2): 1 via ORAL
  Filled 2021-05-10 (×2): qty 1

## 2021-05-10 NOTE — Social Work (Signed)
?  CSW spoke to pt about discharging planning and homelessness. Pt stated that he could go not to cousin with O2 because she does not have the space. CSW offered to call cousin and pt declined for CSW to call. CSW encouraged pt to follow up with cousin and he stated that he did not think it would make a difference. CSW asked pt if he had an alterative to go live and pt stated no. Pt informed CSW that he has been feeling better since he has been on O2. Pt will most likely have a barrier to going to a shelter with 02. ?

## 2021-05-10 NOTE — Progress Notes (Signed)
SATURATION QUALIFICATIONS: (This note is used to comply with regulatory documentation for home oxygen) ? ?Patient Saturations on Room Air at Rest = 78% ? ?Patient Saturations on Room Air while Ambulating = N/A ? ?Patient Saturations on 4 Liters of oxygen while Ambulating = 91% ? ?Please briefly explain why patient needs home oxygen: Pt is requiring 4L of supplemental O2 to maintain sats >/= 92% at rest and >/= 91% with mobility. ? ? ?Moishe Spice, PT, DPT ?Acute Rehabilitation Services  ?Pager: [phone] ?Office: [phone] ? ?

## 2021-05-10 NOTE — Discharge Summary (Addendum)
Physician Discharge Summary  ?Jason [name]. ZES:[id number] DOB: 08-03-1960 DOA: 05/09/2021 ? ?PCP: Pcp, No ? ?Admit date: 05/09/2021 ?Discharge date: 05/10/2021 ? ?Admitted From: Home ?Disposition: Home ? ?Recommendations for Outpatient Follow-up:  ?Follow up with PCP in 1-2 weeks ?Please obtain BMP/CBC in one week ?Please follow up with nephrology and orthopedics as scheduled ? ?Home Health: None -outpatient physical therapy recommended ?Equipment/Devices: Rollator ? ?Discharge Condition: Stable ?CODE STATUS: Full ?Diet recommendation: Renal diet ? ?Brief/Interim Summary: ?Jason [name]. is a 61 y.o. male with profound non compliance who skips medication and frequently misses dialysis/cuts sessions short - known medical history of ESRD on TTS dialysis, diastolic heart failure and 'as needed oxygen' (of unclear rate) who presents with left knee swelling and pain, imaging unremarkable for any acute findings other than mild edema.  Patient does carry history of gout but states he has been compliant with his diet, outpatient follow-up with PCP as scheduled. Patient evaluated by physical therapy recommending outpatient PT.  Discussed with nephrology no indication for urgent or emergent dialysis, [place]ma given in the presence of hyperkalemia, recommend follow-up with dialysis in the outpatient setting as scheduled.  Patient could contact dialysis on Monday for possible partial session if necessary. Patient requires upwards of 4L [place] with ambulation but states he wears oxygen 'as needed' at home (but cannot confirm flow rate). Can continue home oxygen at 4L until follow up for dialysis. ? ?Patient updated staff - he DOES NOT wear oxygen as previously noted at home and has no safe disposition at this time. Will keep in hospital for HD in am and follow hypoxia asthis is a new diagnosis not chronic (appears he wears oxygen PRN during dialysis when he is volume overloaded but not chronically). ? ?Discharge Diagnoses:   ?Principal Problem: ?  Non-compliance with renal dialysis ?Active Problems: ?  Hyperkalemia ?  ESRD (end stage renal disease) on dialysis [facility]) ?  Left knee pain ?  Abnormal CXR ?  Essential hypertension ?  Chronic diastolic CHF (congestive heart failure) ([place]) ?  Homelessness ? ? ?Discharge Instructions ? ?Discharge Instructions   ? ? Discharge patient   Complete by: As directed ?  ? Discharge disposition: 01-Home or Self Care  ? Discharge patient date: 05/10/2021  ? ?  ? ?Allergies as of 05/10/2021   ? ?   Reactions  ? Lisinopril Cough  ? ?  ? ?  ?Medication List  ?  ? ?STOP taking these medications   ? ?methocarbamol 500 MG tablet ?Commonly known as: ROBAXIN ?  ? ?  ? ?TAKE these medications   ? ?amLODipine 10 MG tablet ?Commonly known as: NORVASC ?Take 10 mg by mouth every morning. ?  ?carvedilol 25 MG tablet ?Commonly known as: COREG ?Take 1 tablet (25 mg total) by mouth 2 (two) times daily with a meal. ?  ?lanthanum 1000 MG chewable tablet ?Commonly known as: FOSRENOL ?Chew 1,000 mg by mouth 3 (three) times daily with meals. ?  ?multivitamin Tabs tablet ?Take 1 tablet by mouth every morning. ?  ?oxyCODONE-acetaminophen 5-325 MG tablet ?Commonly known as: PERCOCET/ROXICET ?Take 1 tablet by mouth every 12 (twelve) hours as needed for up to 12 doses for severe pain. ?  ?polyvinyl alcohol 1.4 % ophthalmic solution ?Commonly known as: LIQUIFILM TEARS ?Place 1 drop into both eyes daily as needed for dry eyes. ?  ? ?  ? ? ?Allergies  ?Allergen Reactions  ? Lisinopril Cough  ? ? ?Consultations: ?Nephrology ? ? ?Procedures/Studies: ?DG  Chest 2V REPEAT Same day ? ?Result Date: 05/09/2021 ?CLINICAL DATA:  Shortness of breath EXAM: CHEST - 2 VIEW SAME DAY COMPARISON:  Multiple prior chest radiographs, most recently earlier today FINDINGS: Chronic contour abnormality along the left lateral cardiomediastinal border. This is similar dating back to initial chest radiograph on 01/16/2017. On the current lateral view (as well as  on priors), this appears remote/distinct from the descending thoracic aorta. Regardless, while the stability is reassuring, the exact etiology is unclear, and warrants CT for further investigation. Lungs are clear.  No pleural effusion or pneumothorax. Mild cardiomegaly.  Thoracic aortic atherosclerosis. IMPRESSION: Stable left mediastinal contour abnormality, grossly unchanged dating back to 2019, but warranting CT evaluation. CT chest with contrast is suggested. Electronically Signed   By: Julian Hy M.D.   On: 05/09/2021 22:10  ? ?DG Chest Port 1 View ? ?Result Date: 05/09/2021 ?CLINICAL DATA:  Shortness of breath EXAM: PORTABLE CHEST 1 VIEW COMPARISON:  03/15/2021 FINDINGS: Transverse diameter of heart is increased. There is 4.4 cm focal bulge with convexity to the left overlying the left hilum. There are no signs of pulmonary edema or focal pulmonary consolidation. Left hemidiaphragm is elevated. There is no pleural effusion or pneumothorax. IMPRESSION: Cardiomegaly. There are no signs of pulmonary edema or focal pulmonary consolidation. There is 4.4 cm smooth marginated structure with convex left margin superimposed over the left hilum. This may be due to tortuosity or aneurysmal dilation in the descending thoracic aorta. Follow-up PA and lateral views of chest along with CT if warranted should be considered. Electronically Signed   By: Elmer Picker M.D.   On: 05/09/2021 18:49  ? ?DG Knee Complete 4 Views Left ? ?Result Date: 05/09/2021 ?CLINICAL DATA:  RIGHT knee pain and swelling, hypoxemia, missed dialysis today EXAM: LEFT KNEE - COMPLETE 4+ VIEW COMPARISON:  None FINDINGS: Osseous mineralization normal. Joint space narrowing and spur formation at medial compartment. Mild patellofemoral joint space narrowing. No acute fracture, dislocation, or bone destruction. Small joint effusion. IMPRESSION: Degenerative changes LEFT knee with small joint effusion. No acute osseous findings. Electronically  Signed   By: Lavonia Dana M.D.   On: 05/09/2021 19:18   ? ?Subjective: No acute issues/events overnight, knee pain resolving ? ?Discharge Exam: ?Vitals:  ? 05/10/21 0759 05/10/21 0819  ?BP:  (!) 136/96  ?Pulse: 62 (!) 59  ?Resp: 16 19  ?Temp:  (!) 97.4 ?F (36.3 ?C)  ?SpO2: 98% 98%  ? ?Vitals:  ? 05/10/21 0440 05/10/21 0505 05/10/21 0759 05/10/21 0819  ?BP: 139/84   (!) 136/96  ?Pulse:   62 (!) 59  ?Resp: '18  16 19  '$ ?Temp: 98.6 ?F (37 ?C)   (!) 97.4 ?F (36.3 ?C)  ?TempSrc: Oral   Oral  ?SpO2:  (!) 81% 98% 98%  ?Weight:      ?Height:      ? ? ?General: Pt is alert, awake, not in acute distress ?Cardiovascular: RRR, S1/S2 +, no rubs, no gallops ?Respiratory: CTA bilaterally, no wheezing, no rhonchi ?Abdominal: Soft, NT, ND, bowel sounds + ?Extremities: no edema, no cyanosis; Left upper extremity fistula thrill noted ? ?The results of significant diagnostics from this hospitalization (including imaging, microbiology, ancillary and laboratory) are listed below for reference.   ? ? ?Microbiology: ?No results found for this or any previous visit (from the past 240 hour(s)).  ? ?Labs: ?BNP (last 3 results) ?Recent Labs  ?  08/30/20 ?1935 02/09/21 ?7782 05/09/21 ?1830  ?BNP 3,564.1* 2,308.6* 2,979.0*  ? ?Basic Metabolic  Panel: ?Recent Labs  ?Lab 05/09/21 ?1757 05/10/21 ?6962  ?NA 134* 135  ?K 6.2* 5.4*  ?CL 93* 95*  ?CO2 27 24  ?GLUCOSE 89 113*  ?BUN 86* 97*  ?CREATININE 11.34* 11.74*  ?CALCIUM 9.0 8.6*  ? ?Liver Function Tests: ?Recent Labs  ?Lab 05/09/21 ?1757  ?AST 20  ?ALT 22  ?ALKPHOS 31*  ?BILITOT 1.1  ?PROT 6.7  ?ALBUMIN 3.4*  ? ?No results for input(s): LIPASE, AMYLASE in the last 168 hours. ?No results for input(s): AMMONIA in the last 168 hours. ?CBC: ?Recent Labs  ?Lab 05/09/21 ?1757 05/10/21 ?9528  ?WBC 4.5 4.0  ?NEUTROABS 3.6  --   ?HGB 11.2* 9.9*  ?HCT 33.8* 30.1*  ?MCV 96.3 94.7  ?PLT 178 161  ? ?Cardiac Enzymes: ?No results for input(s): CKTOTAL, CKMB, CKMBINDEX, TROPONINI in the last 168 hours. ?BNP: ?Invalid  input(s): POCBNP ?CBG: ?No results for input(s): GLUCAP in the last 168 hours. ?D-Dimer ?No results for input(s): DDIMER in the last 72 hours. ?Hgb A1c ?No results for input(s): HGBA1C in the last 72 hours. ?

## 2021-05-10 NOTE — TOC Transition Note (Addendum)
Transition of Care (TOC) - CM/SW Discharge Note ? ? ?Patient Details  ?Name: Jason [name]. ?MRN: [id number] ?Date of Birth: January 26, 1960 ? ?Transition of Care (TOC) CM/SW Contact:  ?Konrad Penta, RN ?Phone Number: [phone] ?05/10/2021, 1:04 PM ? ? ?Clinical Narrative:  Mr, Calix will need rollator and home oxygen arranged prior to discharge. Mr. Rickey lives with his cousin. Address is [address], [place], [place] [address]. Mr. Cathey will also require cab voucher. Oxygen and rollater will be delivered to room prior to dc. Spoke with [place] with [employer] to order rollator and oxygen. Made nursing aware. Will provide cab voucher to nursing unit to call when patient ready for dc. Mr. Oestreich declined Probation officer making referral for outpatient PT.  ? ?Addendum 1357: Spoke with Mr. Bellina again at bedside. Mr. Trumbull states did not realize he would need a home oxygen concentrator for oxygen. States oxygen concentrator will be too large for his cousin's home. States his cousin will not allow it. States he is looking for another place to live. Patient has been essentially homeless but has been living with his cousin temporarily. Now that he will require continuous home oxygen, he states his cousin will not allow it. Therefore, he does not have a safe discharge without having 4 liters of oxygen continuous. MD and treatment team aware. Will need transportation assistance when discharged. Made Jasmine with [employer] aware home oxygen delivery on hold for now.  ? ? ? ?Final next level of care: Home/Self Care ?Barriers to Discharge: No Barriers Identified ? ? ?Patient Goals and CMS Choice ?Patient states their goals for this hospitalization and ongoing recovery are:: return home ?CMS Medicare.gov Compare Post Acute Care list provided to:: Patient ?Choice offered to / list presented to : Patient ? ?Discharge Placement ?  ?           ?  ?  ?  ?  ? ?Discharge Plan and Services ?  ?  ?           ?DME Arranged: Oxygen, Walker rolling with  seat ?DME Agency: [employer] ?Date DME Agency Contacted: 05/10/21 ?Time DME Agency Contacted: [phone] ?Representative spoke with at DME Agency: Delana Meyer ?  ?  ?  ?  ?  ? ?Social Determinants of Health (SDOH) Interventions ?  ? ? ?Readmission Risk Interventions ? ?  08/04/2020  ?  4:32 PM 04/30/2019  ?  1:19 PM  ?Readmission Risk Prevention Plan  ?Transportation Screening Complete Complete  ?PCP or Specialist Appt within 3-5 Days  Complete  ?[place] or Home Care Consult Complete Complete  ?Social Work Consult for [place] Planning/Counseling Complete Complete  ?Palliative Care Screening Not Applicable Not Applicable  ?Medication Review Press photographer) Complete Complete  ? ? ? ? ? ?

## 2021-05-10 NOTE — Evaluation (Signed)
Physical Therapy Evaluation ?Patient Details ?Name: Jason [name]. ?MRN: [id number] ?DOB: 11-20-1960 ?Today's Date: 05/10/2021 ? ?History of Present Illness ? Pt is a 61 y.o. male who presented 05/09/21 with SOB and L knee pain. Pt missed HD on Saturday. Pt with hyperkalemia. X ray of L knee negative for acute findings.  PMH: ESRD on TTS dialysis, HTN, mod PAH, HFpEF (EF 50-55%, grade 1 DD in Feb 2021), COPD ?  ?Clinical Impression ? Pt presents with condition above and deficits mentioned below, see PT Problem List. PTA, he reports being homeless but currently staying with his cousin in her 1-level house with 2 short STE. Pt is IND at baseline. Pt reports the L knee pain began yesterday and did report a "pop" when pivoting off the foot yesterday. Noted edema medially and laterally at L knee, negative valgus/varus stress tests, and inability to perform [facility] test due to pain with full weight bearing on L leg. I question potential meniscus tear secondary to the noted edema and report of a "pop" yesterday when pivoting and pain with weight bearing beginning yesterday. While pt continues to be able to mobilize mod I with or without UE support, he is limited in mobility by his L knee and chronic low back pain. He is also limited in aerobic endurance, with SpO2 at 78% on RA at rest, requiring 4L of O2 to maintain sats >/= 92% at rest and >/= 91% with gait. Educated pt on proper, safe utilization of a rollator to reduce his pain and for energy conservation. Will continue to follow acutely. Recommend OPPT to address his L knee and low back pain and endurance deficits.  ?   ? ?Recommendations for follow up therapy are one component of a multi-disciplinary discharge planning process, led by the attending physician.  Recommendations may be updated based on patient status, additional functional criteria and insurance authorization. ? ?Follow Up Recommendations Outpatient PT (for L knee and low back pain) ? ?  ?Assistance  Recommended at Discharge PRN  ?Patient can return home with the following ? Assistance with cooking/housework;Assist for transportation ? ?  ?Equipment Recommendations Rollator (4 wheels) (for 6'2" height)  ?Recommendations for Other Services ?    ?  ?Functional Status Assessment Patient has had a recent decline in their functional status and demonstrates the ability to make significant improvements in function in a reasonable and predictable amount of time.  ? ?  ?Precautions / Restrictions Precautions ?Precautions: Other (comment) ?Precaution Comments: watch SpO2 ?Restrictions ?Weight Bearing Restrictions: No  ? ?  ? ?Mobility ? Bed Mobility ?Overal bed mobility: Modified Independent ?  ?  ?  ?  ?  ?  ?General bed mobility comments: Able to perform all bed mobility aspects without assistance. ?  ? ?Transfers ?Overall transfer level: Modified independent ?Equipment used: Rollator (4 wheels), None ?  ?  ?  ?  ?  ?  ?  ?General transfer comment: Able to come to stand without LOB or assistance, avoids L leg due to L knee pain though. Cues for rollator brake application ?  ? ?Ambulation/Gait ?Ambulation/Gait assistance: Modified independent (Device/Increase time) ?Gait Distance (Feet): 60 Feet (x2 bouts of ~15 ft > ~60 ft) ?Assistive device: Rollator (4 wheels), None ?Gait Pattern/deviations: Step-through pattern, Decreased stride length, Decreased stance time - left, Decreased weight shift to left, Knee flexed in stance - left, Antalgic, Trunk flexed ?Gait velocity: reduced ?Gait velocity interpretation: 1.31 - 2.62 ft/sec, indicative of limited community ambulator ?  ?General Gait  Details: Pt with slow, antalgic gait pattern due to L knee pain. Able to ambulate without UE support without LOB but benefits from rollator for pain management. ? ?Stairs ?  ?  ?  ?  ?  ? ?Wheelchair Mobility ?  ? ?Modified Rankin (Stroke Patients Only) ?  ? ?  ? ?Balance Overall balance assessment: Mild deficits observed, not formally  tested ?  ?  ?  ?  ?  ?  ?  ?  ?  ?  ?  ?  ?  ?  ?  ?  ?  ?  ?   ? ? ? ?Pertinent Vitals/Pain Pain Assessment ?Pain Assessment: Faces ?Faces Pain Scale: Hurts little more ?Pain Location: L knee, chronic low back pain ?Pain Descriptors / Indicators: Discomfort, Grimacing, Guarding ?Pain Intervention(s): Limited activity within patient's tolerance, Monitored during session, Repositioned  ? ? ?Home Living Family/patient expects to be discharged to:: Private residence ?Living Arrangements: Other relatives (cousin) ?Available Help at Discharge: Family;Available 24 hours/day ?Type of Home: House ?Home Access: Stairs to enter ?Entrance Stairs-Rails: None ?Entrance Stairs-Number of Steps: 2 (short) ?  ?Home Layout: One level ?Home Equipment: None ?Additional Comments: reports being homeless technically but staying with cousin currently until he finds a place  ?  ?Prior Function Prior Level of Function : Independent/Modified Independent ?  ?  ?  ?  ?  ?  ?Mobility Comments: No falls. ?  ?  ? ? ?Hand Dominance  ?   ? ?  ?Extremity/Trunk Assessment  ? Upper Extremity Assessment ?Upper Extremity Assessment: Overall WFL for tasks assessed ?  ? ?Lower Extremity Assessment ?Lower Extremity Assessment: LLE deficits/detail ?LLE Deficits / Details: Edema noted medially and laterally at knee; L leg darker in pigmentation than R (reports has been like this for past 5 years); strength WFL; no laxity noted with varus valgus stress tests; unable to tolerate thessaly test due to pain with WB, but reports a "pop" occurring yesterday when he pivoted and pain began yesterday ?  ? ?Cervical / Trunk Assessment ?Cervical / Trunk Assessment: Other exceptions ?Cervical / Trunk Exceptions: chronic low back pain  ?Communication  ? Communication: No difficulties  ?Cognition Arousal/Alertness: Awake/alert ?Behavior During Therapy: [facility] for tasks assessed/performed ?Overall Cognitive Status: Within Functional Limits for tasks assessed ?  ?  ?  ?  ?  ?   ?  ?  ?  ?  ?  ?  ?  ?  ?  ?  ?  ?  ?  ? ?  ?General Comments General comments (skin integrity, edema, etc.): SpO2 78% on RA at rest, needing 4L to increase to >/= 92% at rest and >/= 91% with gait; educated pt on use of rollator for knee and back pain management and energy conservation and on safe usage of rollator ? ?  ?Exercises    ? ?Assessment/Plan  ?  ?PT Assessment Patient needs continued PT services  ?PT Problem List Decreased activity tolerance;Decreased balance;Decreased mobility;Pain;Cardiopulmonary status limiting activity ? ?   ?  ?PT Treatment Interventions DME instruction;Gait training;Stair training;Functional mobility training;Therapeutic activities;Therapeutic exercise;Balance training;Neuromuscular re-education;Patient/family education   ? ?PT Goals (Current goals can be found in the Care Plan section)  ?Acute Rehab PT Goals ?Patient Stated Goal: to get better ?PT Goal Formulation: With patient ?Time For Goal Achievement: 05/24/21 ?Potential to Achieve Goals: Good ? ?  ?Frequency Min 2X/week ?  ? ? ?Co-evaluation   ?  ?  ?  ?  ? ? ?  ?  AM-PAC PT "6 Clicks" Mobility  ?Outcome Measure Help needed turning from your back to your side while in a flat bed without using bedrails?: None ?Help needed moving from lying on your back to sitting on the side of a flat bed without using bedrails?: None ?Help needed moving to and from a bed to a chair (including a wheelchair)?: None ?Help needed standing up from a chair using your arms (e.g., wheelchair or bedside chair)?: None ?Help needed to walk in hospital room?: None ?Help needed climbing 3-5 steps with a railing? : A Little ?6 Click Score: 23 ? ?  ?End of Session Equipment Utilized During Treatment: Oxygen ?Activity Tolerance: Patient tolerated treatment well ?Patient left: in bed;with call bell/phone within reach;with nursing/sitter in room ?Nurse Communication: Mobility status;Other (comment) (sats) ?PT Visit Diagnosis: Unsteadiness on feet (R26.81);Other  abnormalities of gait and mobility (R26.89);Difficulty in walking, not elsewhere classified (R26.2);Pain ?Pain - Right/Left: Left ?Pain - part of body: Knee ?  ? ?Time: 5520-8022 ?PT Time Calculation (min) (A

## 2021-05-10 NOTE — Consult Note (Addendum)
 [facility] Renal Consultation Note    Indication for Consultation:  Management of ESRD/hemodialysis; anemia, hypertension/volume and secondary hyperparathyroidism PCP: No PCP  HPI: Jason [name]. is a 61 y.o. male with ESRD on hemodialysis T,Th,S at [facility]. PMH: HTN, HFpEF, G1DD, PAH, Anemia, SHPT. Long standing issues with noncompliance with HD. Last HD 05/07/2021. He has had a little over 4 hours of HD this week. SCr 11.7 BUN 97 without signs of overt uremia. Mild hyperkalemia K+ 6.2 on admission  that has been managed with Lokelma . K+ 5.4 this AM. He has been admitted as observation patient with noncompliance with HD, hyperkalemia. No acute HD needs at this time. Will repeat Lokelma  this AM. HD off schedule 1st shift in AM.  Swelling noted L knee but xray without acute findings, no leukocytosis. Per primary.    Past Medical History:  Diagnosis Date   A-V fistula (HCC)    Right arm   Abdominal hernia    AKI (acute kidney injury) (HCC) 01/2015   Anasarca    Anemia    Asthma    as a child   Bilateral inguinal hernia    Bilateral pleural effusion 01/2017   Cellulitis 01/16/2017   Bilateral lower extremity   CHF (congestive heart failure) (HCC)    Chronic venous insufficiency    Concentric left ventricular hypertrophy 08/17/2017   Moderated, noted on ECHO   Diastolic dysfunction 08/17/2017   noted on ECHO   Elevated LFTs    per patient, resolved   End stage renal disease (HCC)    Dialysis T/Th/Sa  Started 02/2017/pt is waiting for a kidney transplant   H/O right inguinal hernia repair 11/2017   History of colonoscopy 03/20/2018   History of elevated PSA 10/2017   Hypertension    Pneumonia    Pulmonary hypertension (HCC)    Moderate   Venous stasis ulcer (HCC)    bil legs/ healed up now per pt (03/06/18)   Past Surgical History:  Procedure Laterality Date   AV FISTULA PLACEMENT Right 01/20/2017   Procedure: ARTERIOVENOUS (AV) FISTULA  CREATION;  Surgeon: Eliza Lonni RAMAN, MD;  Location: [facility] OR;  Service: Vascular;  Laterality: Right;   HERNIA REPAIR     INSERTION OF DIALYSIS CATHETER Right 01/20/2017   Procedure: INSERTION OF DIALYSIS CATHETER;  Surgeon: Eliza Lonni RAMAN, MD;  Location: [facility] OR;  Service: Vascular;  Laterality: Right;   INSERTION OF MESH N/A 11/18/2017   Procedure: INSERTION OF MESH;  Surgeon: Sheldon Standing, MD;  Location: [facility] ORS;  Service: General;  Laterality: N/A;   IR FLUORO GUIDE CV LINE RIGHT  01/18/2017   IR LUMBAR DISC ASPIRATION W/IMG GUIDE  07/21/2020   IR US  GUIDE VASC ACCESS RIGHT  01/18/2017   LAPAROSCOPIC INGUINAL HERNIA WITH UMBILICAL HERNIA Bilateral 11/18/2017   Procedure: LAPAROSCOPIC BILATERAL INGUINAL HERNIA REPAIR WITH UMBILICAL HERNIA REPAIR WITH MESH;  Surgeon: Sheldon Standing, MD;  Location: [facility] ORS;  Service: General;  Laterality: Bilateral;   TOE SURGERY     right fracture in big toe   TONSILLECTOMY     Family History  Problem Relation Age of Onset   Heart attack Mother 21       CABG hx   Heart disease Mother    Hypertension Mother    Stroke Mother    Diabetes Father    Healthy Brother    Prostate cancer Maternal Grandfather    Colon cancer Neg Hx    Colon polyps Neg Hx  Esophageal cancer Neg Hx    Stomach cancer Neg Hx    Rectal cancer Neg Hx    Social History:  reports that he quit smoking about 26 years ago. He has never used smokeless tobacco. He reports that he does not currently use alcohol . He reports that he does not currently use drugs. Allergies  Allergen Reactions   Lisinopril Cough   Prior to Admission medications   Medication Sig Start Date End Date Taking? Authorizing Provider  amLODipine  (NORVASC ) 10 MG tablet Take 10 mg by mouth every morning. 06/20/18  Yes [provider]  carvedilol  (COREG ) 25 MG tablet Take 1 tablet (25 mg total) by mouth 2 (two) times daily with a meal. 02/02/18  Yes Stroud, Natalie M, FNP  lanthanum (FOSRENOL) 1000 MG  chewable tablet Chew 1,000 mg by mouth 3 (three) times daily with meals.   Yes [provider]  multivitamin (RENA-VIT) TABS tablet Take 1 tablet by mouth every morning. 10/20/17  Yes [provider]  oxyCODONE -acetaminophen  (PERCOCET/ROXICET) 5-325 MG tablet Take 1 tablet by mouth every 12 (twelve) hours as needed for up to 12 doses for severe pain. 02/28/21  Yes Trifan, Donnice PARAS, MD  polyvinyl alcohol  (LIQUIFILM TEARS) 1.4 % ophthalmic solution Place 1 drop into both eyes daily as needed for dry eyes.   Yes [provider]  methocarbamol  (ROBAXIN ) 500 MG tablet Take 1 tablet (500 mg total) by mouth 2 (two) times daily as needed for muscle spasms. Patient not taking: Reported on 03/15/2021 02/08/21   Caccavale, Sophia, PA-C   Current Facility-Administered Medications  Medication Dose Route Frequency Provider Last Rate Last Admin   acetaminophen  (TYLENOL ) tablet 650 mg  650 mg Oral Q6H PRN Lonzell Emeline HERO, DO   650 mg at 05/10/21 0150   Or   acetaminophen  (TYLENOL ) suppository 650 mg  650 mg Rectal Q6H PRN Lonzell Emeline HERO, DO       heparin  injection 5,000 Units  5,000 Units Subcutaneous Q8H Lonzell Emeline M, DO   5,000 Units at 05/10/21 9394   ondansetron  (ZOFRAN ) tablet 4 mg  4 mg Oral Q6H PRN Lonzell Emeline HERO, DO       Or   ondansetron  (ZOFRAN ) injection 4 mg  4 mg Intravenous Q6H PRN Lonzell Emeline HERO, DO       Labs: Basic Metabolic Panel: Recent Labs  Lab 05/09/21 1757 05/10/21 0349  NA 134* 135  K 6.2* 5.4*  CL 93* 95*  CO2 27 24  GLUCOSE 89 113*  BUN 86* 97*  CREATININE 11.34* 11.74*  CALCIUM  9.0 8.6*   Liver Function Tests: Recent Labs  Lab 05/09/21 1757  AST 20  ALT 22  ALKPHOS 31*  BILITOT 1.1  PROT 6.7  ALBUMIN  3.4*   No results for input(s): LIPASE, AMYLASE in the last 168 hours. No results for input(s): AMMONIA in the last 168 hours. CBC: Recent Labs  Lab 05/09/21 1757 05/10/21 0349  WBC 4.5 4.0  NEUTROABS 3.6  --   HGB 11.2*  9.9*  HCT 33.8* 30.1*  MCV 96.3 94.7  PLT 178 161   Cardiac Enzymes: No results for input(s): CKTOTAL, CKMB, CKMBINDEX, TROPONINI in the last 168 hours. CBG: No results for input(s): GLUCAP in the last 168 hours. Iron Studies: No results for input(s): IRON, TIBC, TRANSFERRIN, FERRITIN in the last 72 hours. Studies/Results: DG Chest 2V REPEAT Same day  Result Date: 05/09/2021 CLINICAL DATA:  Shortness of breath EXAM: CHEST - 2 VIEW SAME DAY COMPARISON:  Multiple  prior chest radiographs, most recently earlier today FINDINGS: Chronic contour abnormality along the left lateral cardiomediastinal border. This is similar dating back to initial chest radiograph on 01/16/2017. On the current lateral view (as well as on priors), this appears remote/distinct from the descending thoracic aorta. Regardless, while the stability is reassuring, the exact etiology is unclear, and warrants CT for further investigation. Lungs are clear.  No pleural effusion or pneumothorax. Mild cardiomegaly.  Thoracic aortic atherosclerosis. IMPRESSION: Stable left mediastinal contour abnormality, grossly unchanged dating back to 2019, but warranting CT evaluation. CT chest with contrast is suggested. Electronically Signed   By: Pinkie Pebbles M.D.   On: 05/09/2021 22:10   DG Chest Port 1 View  Result Date: 05/09/2021 CLINICAL DATA:  Shortness of breath EXAM: PORTABLE CHEST 1 VIEW COMPARISON:  03/15/2021 FINDINGS: Transverse diameter of heart is increased. There is 4.4 cm focal bulge with convexity to the left overlying the left hilum. There are no signs of pulmonary edema or focal pulmonary consolidation. Left hemidiaphragm is elevated. There is no pleural effusion or pneumothorax. IMPRESSION: Cardiomegaly. There are no signs of pulmonary edema or focal pulmonary consolidation. There is 4.4 cm smooth marginated structure with convex left margin superimposed over the left hilum. This may be due to tortuosity or aneurysmal dilation  in the descending thoracic aorta. Follow-up PA and lateral views of chest along with CT if warranted should be considered. Electronically Signed   By: Gearldine Mary M.D.   On: 05/09/2021 18:49   DG Knee Complete 4 Views Left  Result Date: 05/09/2021 CLINICAL DATA:  RIGHT knee pain and swelling, hypoxemia, missed dialysis today EXAM: LEFT KNEE - COMPLETE 4+ VIEW COMPARISON:  None FINDINGS: Osseous mineralization normal. Joint space narrowing and spur formation at medial compartment. Mild patellofemoral joint space narrowing. No acute fracture, dislocation, or bone destruction. Small joint effusion. IMPRESSION: Degenerative changes LEFT knee with small joint effusion. No acute osseous findings. Electronically Signed   By: Oneil Kiss M.D.   On: 05/09/2021 19:18    ROS: As per HPI otherwise negative.  Physical Exam: Vitals:   05/10/21 0338 05/10/21 0440 05/10/21 0505 05/10/21 0819  BP:  139/84  (!) 136/96  Pulse:    (!) 59  Resp:  18  19  Temp:  98.6 F (37 C)  (!) 97.4 F (36.3 C)  TempSrc:  Oral  Oral  SpO2: (!) 81%  (!) 81% 98%  Weight:      Height:         General: Chronically ill appearing male in no acute distress. Head: Normocephalic, atraumatic, sclera non-icteric, mucus membranes are moist Neck: Supple. JVD not elevated. Lungs: Clear bilaterally to auscultation without wheezes, rales, or rhonchi. Breathing is unlabored. Heart: RRR with S1 S2. No M/R/G. SR on monitor.  Abdomen: Soft, non-tender, non-distended with normoactive bowel sounds. No rebound/guarding. No obvious abdominal masses. M-S:  Strength and tone appear normal for age. Lower extremities:without edema or ischemic changes, no open wounds  Neuro: Alert and oriented X 3. Moves all extremities spontaneously. Psych:  Responds to questions appropriately with a normal affect. Dialysis Access: R AVF hugely aneurysmal. +T/B  Dialysis Orders: [facility] T,Th,S  4:15 hr 200 NRe 400/800 76 kg 2.0 K/2.0 Ca AVF -Heparin  6000  units IV TIW -Hectorol  8 mcg IV TIW -Mircera 100 mcg IV q 2 weeks (Last dose 05/02/2021)  Assessment/Plan:  Noncompliance with HD-Last HD 05/07/2021. SCr 11.7 BUN 97 but no overt uremia. Has had 4:23 hours of HD this week.  No acute need to HD today. HD tomorrow AM unless he decompensates.  Hyperkalemia: K+ 6.2 on admission, down to 5.4 this AM after lokelma . Will repeat dose this AM Swelling L Knee: No acute findings on Xray. WBC 4.0. Per primary.   ESRD -  T,Th,S. Next HD 05/11/2021. 2.0 K bath, usual heparin .   Hypertension/volume  - BP controlled. Not overtly volume overloaded. UF as tolerated. Has not been close to EDW in weeks. Believe he could be raised for accuracy on DC.   Anemia  - HGB 9.9. ESA due 05/16/2021. Follow labs.   Metabolic bone disease -  Last OP PO4 12 05/07/2021. Nothing new. Continue binders, VDRA.   Nutrition - renal diet, nepro.  Deby Adger H. Delores, NP-C 05/10/2021, 8:50 AM  [employer] [phone]

## 2021-05-11 DIAGNOSIS — R0902 Hypoxemia: Secondary | ICD-10-CM | POA: Diagnosis not present

## 2021-05-11 DIAGNOSIS — M25562 Pain in left knee: Secondary | ICD-10-CM | POA: Diagnosis present

## 2021-05-11 DIAGNOSIS — M109 Gout, unspecified: Secondary | ICD-10-CM | POA: Diagnosis present

## 2021-05-11 DIAGNOSIS — Z992 Dependence on renal dialysis: Secondary | ICD-10-CM | POA: Diagnosis not present

## 2021-05-11 DIAGNOSIS — Z87891 Personal history of nicotine dependence: Secondary | ICD-10-CM | POA: Diagnosis not present

## 2021-05-11 DIAGNOSIS — I132 Hypertensive heart and chronic kidney disease with heart failure and with stage 5 chronic kidney disease, or end stage renal disease: Secondary | ICD-10-CM | POA: Diagnosis present

## 2021-05-11 DIAGNOSIS — Z91158 Patient's noncompliance with renal dialysis for other reason: Secondary | ICD-10-CM | POA: Diagnosis not present

## 2021-05-11 DIAGNOSIS — N2581 Secondary hyperparathyroidism of renal origin: Secondary | ICD-10-CM | POA: Diagnosis present

## 2021-05-11 DIAGNOSIS — R531 Weakness: Secondary | ICD-10-CM | POA: Diagnosis not present

## 2021-05-11 DIAGNOSIS — N186 End stage renal disease: Secondary | ICD-10-CM | POA: Diagnosis present

## 2021-05-11 DIAGNOSIS — M898X9 Other specified disorders of bone, unspecified site: Secondary | ICD-10-CM | POA: Diagnosis present

## 2021-05-11 DIAGNOSIS — J9621 Acute and chronic respiratory failure with hypoxia: Secondary | ICD-10-CM | POA: Diagnosis present

## 2021-05-11 DIAGNOSIS — Z5900 Homelessness unspecified: Secondary | ICD-10-CM | POA: Diagnosis not present

## 2021-05-11 DIAGNOSIS — I5032 Chronic diastolic (congestive) heart failure: Secondary | ICD-10-CM | POA: Diagnosis not present

## 2021-05-11 DIAGNOSIS — Z79899 Other long term (current) drug therapy: Secondary | ICD-10-CM | POA: Diagnosis not present

## 2021-05-11 DIAGNOSIS — I5033 Acute on chronic diastolic (congestive) heart failure: Secondary | ICD-10-CM | POA: Diagnosis not present

## 2021-05-11 DIAGNOSIS — D631 Anemia in chronic kidney disease: Secondary | ICD-10-CM | POA: Diagnosis present

## 2021-05-11 DIAGNOSIS — I272 Pulmonary hypertension, unspecified: Secondary | ICD-10-CM | POA: Diagnosis present

## 2021-05-11 DIAGNOSIS — Z888 Allergy status to other drugs, medicaments and biological substances status: Secondary | ICD-10-CM | POA: Diagnosis not present

## 2021-05-11 DIAGNOSIS — E875 Hyperkalemia: Secondary | ICD-10-CM | POA: Diagnosis not present

## 2021-05-11 DIAGNOSIS — Z8249 Family history of ischemic heart disease and other diseases of the circulatory system: Secondary | ICD-10-CM | POA: Diagnosis not present

## 2021-05-11 LAB — RENAL FUNCTION PANEL
Albumin: 2.8 g/dL — ABNORMAL LOW (ref 3.5–5.0)
Anion gap: 18 — ABNORMAL HIGH (ref 5–15)
BUN: 116 mg/dL — ABNORMAL HIGH (ref 6–20)
CO2: 23 mmol/L (ref 22–32)
Calcium: 7.9 mg/dL — ABNORMAL LOW (ref 8.9–10.3)
Chloride: 92 mmol/L — ABNORMAL LOW (ref 98–111)
Creatinine, Ser: 13.6 mg/dL — ABNORMAL HIGH (ref 0.61–1.24)
GFR, Estimated: 4 mL/min — ABNORMAL LOW (ref >60)
Glucose, Bld: 121 mg/dL — ABNORMAL HIGH (ref 70–99)
Phosphorus: 12 mg/dL — ABNORMAL HIGH (ref 2.5–4.6)
Potassium: 5.3 mmol/L — ABNORMAL HIGH (ref 3.5–5.1)
Sodium: 133 mmol/L — ABNORMAL LOW (ref 135–145)

## 2021-05-11 LAB — CBC
HCT: 27.8 % — ABNORMAL LOW (ref 39.0–52.0)
Hemoglobin: 9.5 g/dL — ABNORMAL LOW (ref 13.0–17.0)
MCH: 32 pg (ref 26.0–34.0)
MCHC: 34.2 g/dL (ref 30.0–36.0)
MCV: 93.6 fL (ref 80.0–100.0)
Platelets: 154 K/uL (ref 150–400)
RBC: 2.97 MIL/uL — ABNORMAL LOW (ref 4.22–5.81)
RDW: 15.2 % (ref 11.5–15.5)
WBC: 3.9 K/uL — ABNORMAL LOW (ref 4.0–10.5)
nRBC: 0 % (ref 0.0–0.2)

## 2021-05-11 LAB — HEPATITIS B SURFACE ANTIGEN: Hepatitis B Surface Ag: NONREACTIVE

## 2021-05-11 LAB — HEPATITIS B SURFACE ANTIBODY,QUALITATIVE: Hep B S Ab: NONREACTIVE

## 2021-05-11 MED ORDER — SODIUM CHLORIDE 0.9 % IV SOLN: 100.0000 mL | INTRAVENOUS | Status: DC | PRN

## 2021-05-11 MED ORDER — PENTAFLUOROPROP-TETRAFLUOROETH EX AERO: 1.0000 "application " | INHALATION_SPRAY | CUTANEOUS | Status: DC | PRN

## 2021-05-11 MED ORDER — LIDOCAINE HCL (PF) 1 % IJ SOLN: 5.0000 mL | INTRAMUSCULAR | Status: DC | PRN

## 2021-05-11 MED ORDER — LIDOCAINE-PRILOCAINE 2.5-2.5 % EX CREA: 1.0000 "application " | TOPICAL_CREAM | CUTANEOUS | Status: DC | PRN

## 2021-05-11 MED ORDER — HEPARIN SODIUM (PORCINE) 1000 UNIT/ML DIALYSIS
6000.0000 [IU] | Freq: Once | INTRAMUSCULAR | Status: AC
  Administered 2021-05-11: 6000 [IU] via INTRAVENOUS_CENTRAL
  Filled 2021-05-11: qty 6

## 2021-05-11 NOTE — Progress Notes (Signed)
SATURATION QUALIFICATIONS: (This note is used to comply with regulatory documentation for home oxygen) ? ?Patient Saturations on Room Air at Rest = 78% ? ?Patient Saturations on Room Air while Ambulating = N/A ? ?Patient Saturations on 3 Liters of oxygen while Ambulating = 87% ? ?Patient Saturations on 5 Liters of oxygen while Ambulating = 92% ? ?Please briefly explain why patient needs home oxygen: Requiring 3 L [place] at rest and 5 L [place] with ambulation  ?

## 2021-05-11 NOTE — TOC Transition Note (Signed)
Transition of Care (TOC) - CM/SW Discharge Note ? ? ?Patient Details  ?Name: Jason [name]. ?MRN: [id number] ?Date of Birth: 12/30/1960 ? ?Transition of Care (TOC) CM/SW Contact:  ?Milas Gain, LCSWA ?Phone Number: ?05/11/2021, 3:12 PM ? ? ?Clinical Narrative:    ? ?Patient will DC to: his car at [address] [place] [address] ? ?Anticipated DC date: 05/11/2021 ? ?Family notified:Patient declined ? ?Transport by: [employer] ? ?? ? ?Per MD patient ready for DC to his car at [address] [place] [address] . RN and patient notified of DC. [employer] transport requested for patient. ? ?CSW signing off.  ? ?Final next level of care:  (Going back to cousins but will transport from here to his car) ?Barriers to Discharge: No Barriers Identified ? ? ?Patient Goals and CMS Choice ?Patient states their goals for this hospitalization and ongoing recovery are:: to go home ?CMS Medicare.gov Compare Post Acute Care list provided to:: Patient ?Choice offered to / list presented to : Patient ? ?Discharge Placement ?  ?           ?  ?Patient to be transferred to facility by: [facility] ?Name of family member notified: Patient declined ?Patient and family notified of of transfer: 05/11/21 ? ?Discharge Plan and Services ?  ?  ?           ?DME Arranged: Oxygen, Walker rolling with seat ?DME Agency: [employer] ?Date DME Agency Contacted: 05/10/21 ?Time DME Agency Contacted: [phone] ?Representative spoke with at DME Agency: Delana Meyer ?  ?  ?  ?  ?  ? ?Social Determinants of Health (SDOH) Interventions ?  ? ? ?Readmission Risk Interventions ? ?  08/04/2020  ?  4:32 PM 04/30/2019  ?  1:19 PM  ?Readmission Risk Prevention Plan  ?Transportation Screening Complete Complete  ?PCP or Specialist Appt within 3-5 Days  Complete  ?[place] or Home Care Consult Complete Complete  ?Social Work Consult for [place] Planning/Counseling Complete Complete  ?Palliative Care Screening Not Applicable Not Applicable  ?Medication Review  Press photographer) Complete Complete  ? ? ? ? ? ?

## 2021-05-11 NOTE — Progress Notes (Signed)
?[facility] ?Progress Note  ? ?Subjective:   Patient seen and examined at bedside in dialysis.  Tolerating treatment well so far. No specific complaints.  Denies CP, SOB, abdominal pain, n/v/d, weakness, dizziness and fatigue.  ? ?Objective ?Vitals:  ? 05/10/21 2003 05/11/21 0517 05/11/21 0728 05/11/21 0730  ?BP: (!) 150/95 120/84 131/85   ?Pulse: 66 62 60 (!) 58  ?Resp: (!) '21 18 15 14  '$ ?Temp: 98.3 ?F (36.8 ?C) 97.7 ?F (36.5 ?C) 97.9 ?F (36.6 ?C)   ?TempSrc: Oral Oral Oral   ?SpO2: 100% 93% 100% 100%  ?Weight:      ?Height:      ? ?Physical Exam ?General:well appearing male in NAD ?Heart:RRR, no mrg ?Lungs:+wheeze in LLL otherwise CTAB, nml WOB on RA ?Abdomen:soft, NTND ?Extremities:no LE edema ?Dialysis Access: RU AVF in use  ? ?Filed Weights  ? 05/09/21 1700 05/10/21 0135  ?Weight: 85.7 kg 85.2 kg  ? ? ?Intake/Output Summary (Last 24 hours) at 05/11/2021 0848 ?Last data filed at 05/10/2021 2000 ?Gross per 24 hour  ?Intake 240 ml  ?Output --  ?Net 240 ml  ? ? ?Additional Objective ?Labs: ?Basic Metabolic Panel: ?Recent Labs  ?Lab 05/09/21 ?1757 05/10/21 ?2440 05/11/21 ?0750  ?NA 134* 135 133*  ?K 6.2* 5.4* 5.3*  ?CL 93* 95* 92*  ?CO2 '27 24 23  '$ ?GLUCOSE 89 113* 121*  ?BUN 86* 97* 116*  ?CREATININE 11.34* 11.74* 13.60*  ?CALCIUM 9.0 8.6* 7.9*  ?PHOS  --   --  12.0*  ? ?Liver Function Tests: ?Recent Labs  ?Lab 05/09/21 ?1757 05/11/21 ?0750  ?AST 20  --   ?ALT 22  --   ?ALKPHOS 31*  --   ?BILITOT 1.1  --   ?PROT 6.7  --   ?ALBUMIN 3.4* 2.8*  ? ?CBC: ?Recent Labs  ?Lab 05/09/21 ?1757 05/10/21 ?1027 05/11/21 ?0750  ?WBC 4.5 4.0 3.9*  ?NEUTROABS 3.6  --   --   ?HGB 11.2* 9.9* 9.5*  ?HCT 33.8* 30.1* 27.8*  ?MCV 96.3 94.7 93.6  ?PLT 178 161 154  ? ?Studies/Results: ?DG Chest 2V REPEAT Same day ? ?Result Date: 05/09/2021 ?CLINICAL DATA:  Shortness of breath EXAM: CHEST - 2 VIEW SAME DAY COMPARISON:  Multiple prior chest radiographs, most recently earlier today FINDINGS: Chronic contour abnormality along the  left lateral cardiomediastinal border. This is similar dating back to initial chest radiograph on 01/16/2017. On the current lateral view (as well as on priors), this appears remote/distinct from the descending thoracic aorta. Regardless, while the stability is reassuring, the exact etiology is unclear, and warrants CT for further investigation. Lungs are clear.  No pleural effusion or pneumothorax. Mild cardiomegaly.  Thoracic aortic atherosclerosis. IMPRESSION: Stable left mediastinal contour abnormality, grossly unchanged dating back to 2019, but warranting CT evaluation. CT chest with contrast is suggested. Electronically Signed   By: Julian Hy M.D.   On: 05/09/2021 22:10  ? ?DG Chest Port 1 View ? ?Result Date: 05/09/2021 ?CLINICAL DATA:  Shortness of breath EXAM: PORTABLE CHEST 1 VIEW COMPARISON:  03/15/2021 FINDINGS: Transverse diameter of heart is increased. There is 4.4 cm focal bulge with convexity to the left overlying the left hilum. There are no signs of pulmonary edema or focal pulmonary consolidation. Left hemidiaphragm is elevated. There is no pleural effusion or pneumothorax. IMPRESSION: Cardiomegaly. There are no signs of pulmonary edema or focal pulmonary consolidation. There is 4.4 cm smooth marginated structure with convex left margin superimposed over the left hilum. This may be due  to tortuosity or aneurysmal dilation in the descending thoracic aorta. Follow-up PA and lateral views of chest along with CT if warranted should be considered. Electronically Signed   By: Elmer Picker M.D.   On: 05/09/2021 18:49  ? ?DG Knee Complete 4 Views Left ? ?Result Date: 05/09/2021 ?CLINICAL DATA:  RIGHT knee pain and swelling, hypoxemia, missed dialysis today EXAM: LEFT KNEE - COMPLETE 4+ VIEW COMPARISON:  None FINDINGS: Osseous mineralization normal. Joint space narrowing and spur formation at medial compartment. Mild patellofemoral joint space narrowing. No acute fracture, dislocation, or bone  destruction. Small joint effusion. IMPRESSION: Degenerative changes LEFT knee with small joint effusion. No acute osseous findings. Electronically Signed   By: Lavonia Dana M.D.   On: 05/09/2021 19:18   ? ?Medications: ? sodium chloride    ? sodium chloride    ? ? amLODipine  10 mg Oral q morning  ? carvedilol  25 mg Oral BID WC  ? doxercalciferol  8 mcg Intravenous Q T,Th,Sa-HD  ? heparin  5,000 Units Subcutaneous Q8H  ? multivitamin  1 tablet Oral q morning  ? sevelamer carbonate  2,400 mg Oral TID WC  ? ? ?Dialysis Orders: ?[place] T,Th,S  ?4:15 hr 200 NRe 400/800 76 kg 2.0 K/2.0 Ca AVF ?-Heparin 6000 units IV TIW ?-Hectorol 8 mcg IV TIW ?-Mircera 100 mcg IV q 2 weeks (Last dose 05/02/2021) ?  ?Assessment/Plan: ? Noncompliance with HD-Last HD 05/07/2021. SCr 11.7 BUN 97 but no overt uremia. Has had 4:23 hours of HD this week. No acute need for HD over weekend.  HD planned for today.  ?Hypoxia - new O2 need noted.  Plan for HD today with net UF 4L. See if improved post HD.  ?Hyperkalemia: K+ 6.2 on admission, down to 5.3 this AM after lokelma. HD today. ?Swelling L Knee: No acute findings on Xray. WBC 3.9. Per primary.  ? ESRD -  T,Th,S. HD today off schedule d/t missed treatments and new hypoxia. 2.0 K bath, usual heparin. Next HD tomorrow 5/2 per regular schedule ? Hypertension/volume  - BP controlled. Does not appear grossly volume overloaded but not close to dry recently.  Max UF with HD today with new dx hypoxia.   ? Anemia  - HGB 9.5. ESA due 05/16/2021. Follow labs.  ? Metabolic bone disease -  Last OP PO4 12 05/07/2021, chronically high.  Continue binders, VDRA.  ? Nutrition - renal diet, nepro. ? ?Jen Mow, PA-C ?[facility] ?05/11/2021,8:48 AM ? LOS: 0 days  ? ? ?

## 2021-05-11 NOTE — Progress Notes (Signed)
Notified Dr. Avon Gully of saturation needs with ambulation of 5 L [place] and oxygen saturation of 86-87% on 3 L [place] with ambulation. He responded that patient will have dialysis tomorrow which should decrease his oxygen needs and Dr. Justus Memory goal for oxygen saturation was 88%. ?Dr. Avon Gully responded, it was okay to discharge patient on current oxygen at 3 L [place] because patients oxygen saturation on 3 L with ambulation was within his goal for patient.  ?

## 2021-05-11 NOTE — Progress Notes (Addendum)
Pt receives out-pt HD at [facility] on TTS. Clinic contacted regarding pt's arrival/chair time and awaiting a response. Will assist as needed.  ? ?Melven Sartorius ?Renal Navigator ?[phone] ? ?Addendum at 1:57 pm: ?Pt arrives at [facility] on TTS at 11:30 for 11:45 chair time. Contacted attending to inquire about possible d/c date in order to provide update to clinic.  ? ?Addendum at 2:27 pm: ?Plan is for pt to d/c later this afternoon. If for some reason that is not possible, discussed with staff regarding early d/c tomorrow am in order for pt to receive out-pt HD at clinic. Spoke to inpt HD unit charge RN to request that pt be placed on 2nd shift for tomorrow. Also contacted [facility] to advise clinic pt should be at clinic tomorrow for regular treatment.  ?

## 2021-05-11 NOTE — Progress Notes (Addendum)
CSW met with patient at bedside regarding dc plan. Patient reports plan is to return back to his cousin [place] house at [employer]. CSW asked if it was okay for him to return to [place] if he will need to return with 02 and patient reports yes.Patient reports he drives himself to HD and reports his plan is to continue to drive himself to HD. Patient reports he will need transportation at dc to get back to his car at [employer]. CSW will assist with transportation for patient when patient is medically ready for dc. ?

## 2021-05-11 NOTE — Discharge Summary (Signed)
Physician Discharge Summary  ?Jason [name]. WLN:[id number] DOB: 1960/12/01 DOA: 05/09/2021 ? ?PCP: Pcp, No ? ?Admit date: 05/09/2021 ?Discharge date: 05/11/2021 ? ?Admitted From: Home ?Disposition: Home ? ?Recommendations for Outpatient Follow-up:  ?Follow up with PCP in 1-2 weeks ?Please obtain BMP/CBC in one week ?Please follow up with nephrology and orthopedics as scheduled ? ?Home Health: None -outpatient physical therapy recommended ?Equipment/Devices: Rollator ? ?Discharge Condition: Stable ?CODE STATUS: Full ?Diet recommendation: Renal diet ? ?Brief/Interim Summary: ?Jason [name]. is a 61 y.o. male with profound non compliance who skips medication and frequently misses dialysis/cuts sessions short - known medical history of ESRD on TTS dialysis, diastolic heart failure and 'as needed oxygen' (of unclear rate) who presents with left knee swelling and pain, imaging unremarkable for any acute findings other than mild edema.  Patient does carry history of gout but states he has been compliant with his diet, outpatient follow-up with PCP as scheduled. Patient evaluated by physical therapy recommending outpatient PT.  Discussed with nephrology no indication for urgent or emergent dialysis, Lokelma given in the presence of hyperkalemia, recommend follow-up with dialysis in the outpatient setting as scheduled.  Patient could contact dialysis on Monday for possible partial session if necessary. Patient requires upwards of 4L [place] with ambulation (occasionally wears oxygen with dialysis - appears to be a chronic but undiagnosed comorbidity). Can continue home oxygen at 3L and follow up for dialysis. ? ? ?Discharge Diagnoses:  ?Principal Problem: ?  Non-compliance with renal dialysis ?Active Problems: ?  Hyperkalemia ?  ESRD (end stage renal disease) on dialysis [facility]) ?  Left knee pain ?  Abnormal CXR ?  Essential hypertension ?  Chronic diastolic CHF (congestive heart failure) ([place]) ?  Homelessness ?  Acute on chronic  respiratory failure with hypoxia (HCC) ? ? ?Discharge Instructions ? ?Discharge Instructions   ? ? Discharge patient   Complete by: As directed ?  ? Discharge disposition: 01-Home or Self Care  ? Discharge patient date: 05/11/2021  ? ?  ? ?Allergies as of 05/11/2021   ? ?   Reactions  ? Lisinopril Cough  ? ?  ? ?  ?Medication List  ?  ? ?STOP taking these medications   ? ?methocarbamol 500 MG tablet ?Commonly known as: ROBAXIN ?  ? ?  ? ?TAKE these medications   ? ?amLODipine 10 MG tablet ?Commonly known as: NORVASC ?Take 10 mg by mouth every morning. ?  ?carvedilol 25 MG tablet ?Commonly known as: COREG ?Take 1 tablet (25 mg total) by mouth 2 (two) times daily with a meal. ?  ?lanthanum 1000 MG chewable tablet ?Commonly known as: FOSRENOL ?Chew 1,000 mg by mouth 3 (three) times daily with meals. ?  ?multivitamin Tabs tablet ?Take 1 tablet by mouth every morning. ?  ?oxyCODONE-acetaminophen 5-325 MG tablet ?Commonly known as: PERCOCET/ROXICET ?Take 1 tablet by mouth every 12 (twelve) hours as needed for up to 12 doses for severe pain. ?  ?polyvinyl alcohol 1.4 % ophthalmic solution ?Commonly known as: LIQUIFILM TEARS ?Place 1 drop into both eyes daily as needed for dry eyes. ?  ? ?  ? ?  ?  ? ? ?  ?Durable Medical Equipment  ?(From admission, onward)  ?  ? ? ?  ? ?  Start     Ordered  ? 05/11/21 1431  For home use only DME oxygen  Once       ?Question Answer Comment  ?Length of Need Lifetime   ?Mode or (Route)  Nasal cannula   ?Liters per Minute 3   ?Frequency Continuous (stationary and portable oxygen unit needed)   ?Oxygen delivery system Gas   ?  ? 05/11/21 1430  ? 05/10/21 1255  For home use only DME oxygen  Once       ?Question Answer Comment  ?Length of Need Lifetime   ?Mode or (Route) Nasal cannula   ?Liters per Minute 4   ?Frequency Continuous (stationary and portable oxygen unit needed)   ?Oxygen delivery system Gas   ?  ? 05/10/21 1254  ? 05/10/21 1254  For home use only DME 4 wheeled rolling walker with seat   Once       ?Question:  Patient needs a walker to treat with the following condition  Answer:  Weakness  ? 05/10/21 1253  ? ?  ?  ? ?  ? ? Follow-up Information   ? ? [facility] Follow up.   ?Why: CALL TO SCHEDULE APPT TO ESTABLISH PCP ?Contact information: ?[address] ?[place] [address] ?[phone] ? ?  ?  ? ?  ?  ? ?  ? ?Allergies  ?Allergen Reactions  ? Lisinopril Cough  ? ? ?Consultations: ?Nephrology ? ? ?Procedures/Studies: ?DG Chest 2V REPEAT Same day ? ?Result Date: 05/09/2021 ?CLINICAL DATA:  Shortness of breath EXAM: CHEST - 2 VIEW SAME DAY COMPARISON:  Multiple prior chest radiographs, most recently earlier today FINDINGS: Chronic contour abnormality along the left lateral cardiomediastinal border. This is similar dating back to initial chest radiograph on 01/16/2017. On the current lateral view (as well as on priors), this appears remote/distinct from the descending thoracic aorta. Regardless, while the stability is reassuring, the exact etiology is unclear, and warrants CT for further investigation. Lungs are clear.  No pleural effusion or pneumothorax. Mild cardiomegaly.  Thoracic aortic atherosclerosis. IMPRESSION: Stable left mediastinal contour abnormality, grossly unchanged dating back to 2019, but warranting CT evaluation. CT chest with contrast is suggested. Electronically Signed   By: Julian Hy M.D.   On: 05/09/2021 22:10  ? ?DG Chest Port 1 View ? ?Result Date: 05/09/2021 ?CLINICAL DATA:  Shortness of breath EXAM: PORTABLE CHEST 1 VIEW COMPARISON:  03/15/2021 FINDINGS: Transverse diameter of heart is increased. There is 4.4 cm focal bulge with convexity to the left overlying the left hilum. There are no signs of pulmonary edema or focal pulmonary consolidation. Left hemidiaphragm is elevated. There is no pleural effusion or pneumothorax. IMPRESSION: Cardiomegaly. There are no signs of pulmonary edema or focal pulmonary  consolidation. There is 4.4 cm smooth marginated structure with convex left margin superimposed over the left hilum. This may be due to tortuosity or aneurysmal dilation in the descending thoracic aorta. Follow-up PA and lateral views of chest along with CT if warranted should be considered. Electronically Signed   By: Elmer Picker M.D.   On: 05/09/2021 18:49  ? ?DG Knee Complete 4 Views Left ? ?Result Date: 05/09/2021 ?CLINICAL DATA:  RIGHT knee pain and swelling, hypoxemia, missed dialysis today EXAM: LEFT KNEE - COMPLETE 4+ VIEW COMPARISON:  None FINDINGS: Osseous mineralization normal. Joint space narrowing and spur formation at medial compartment. Mild patellofemoral joint space narrowing. No acute fracture, dislocation, or bone destruction. Small joint effusion. IMPRESSION: Degenerative changes LEFT knee with small joint effusion. No acute osseous findings. Electronically Signed   By: Lavonia Dana M.D.   On: 05/09/2021 19:18   ? ?Subjective: No acute issues/events overnight, knee pain resolving ? ?Discharge Exam: ?  Vitals:  ? 05/11/21 1228 05/11/21 1230  ?BP: (!) 152/96   ?Pulse: 68 67  ?Resp: 20 16  ?Temp:    ?SpO2: 99% 100%  ? ?Vitals:  ? 05/11/21 1100 05/11/21 1130 05/11/21 1228 05/11/21 1230  ?BP: (!) 150/95 (!) 151/94 (!) 152/96   ?Pulse:   68 67  ?Resp: 18 (!) '24 20 16  '$ ?Temp:      ?TempSrc:      ?SpO2:   99% 100%  ?Weight:      ?Height:      ? ? ?General: Pt is alert, awake, not in acute distress ?Cardiovascular: RRR, S1/S2 +, no rubs, no gallops ?Respiratory: CTA bilaterally, no wheezing, no rhonchi ?Abdominal: Soft, NT, ND, bowel sounds + ?Extremities: no edema, no cyanosis; Left upper extremity fistula thrill noted ? ?The results of significant diagnostics from this hospitalization (including imaging, microbiology, ancillary and laboratory) are listed below for reference.   ? ? ?Microbiology: ?No results found for this or any previous visit (from the past 240 hour(s)).  ? ?Labs: ?BNP (last 3  results) ?Recent Labs  ?  08/30/20 ?1935 02/09/21 ?7034 05/09/21 ?1830  ?BNP 3,564.1* 2,308.6* 2,979.0*  ? ?Basic Metabolic Panel: ?Recent Labs  ?Lab 05/09/21 ?1757 05/10/21 ?0352 05/11/21 ?0750  ?NA 134* 1

## 2021-05-12 ENCOUNTER — Telehealth: Payer: Self-pay | Admitting: Nephrology

## 2021-05-12 LAB — HEPATITIS B SURFACE ANTIBODY, QUANTITATIVE: Hep B S AB Quant (Post): <3.1 m[IU]/mL — ABNORMAL LOW (ref >9.9)

## 2021-05-12 NOTE — Telephone Encounter (Signed)
Transition of care contact from inpatient facility ? ?Date of Discharge: 05/11/21 ?Date of Contact: 05/12/21 ?Method of contact: Phone -attempt  ? ?Attempted to contact patient to discuss transition of care from inpatient admission. Patient did not answer the phone. Message was left on the patient's voicemail with call back number.  ? ?

## 2021-05-13 ENCOUNTER — Ambulatory Visit (INDEPENDENT_AMBULATORY_CARE_PROVIDER_SITE_OTHER): Payer: Medicare HMO | Admitting: Physician Assistant

## 2021-05-13 ENCOUNTER — Encounter: Payer: Self-pay | Admitting: Physician Assistant

## 2021-05-13 VITALS — BP 132/92 | HR 59 | Ht 74.0 in | Wt 180.8 lb

## 2021-05-13 DIAGNOSIS — Z91199 Patient's noncompliance with other medical treatment and regimen due to unspecified reason: Secondary | ICD-10-CM

## 2021-05-13 DIAGNOSIS — I10 Essential (primary) hypertension: Secondary | ICD-10-CM | POA: Diagnosis not present

## 2021-05-13 DIAGNOSIS — N186 End stage renal disease: Secondary | ICD-10-CM

## 2021-05-13 DIAGNOSIS — Z992 Dependence on renal dialysis: Secondary | ICD-10-CM

## 2021-05-13 DIAGNOSIS — R079 Chest pain, unspecified: Secondary | ICD-10-CM

## 2021-05-13 DIAGNOSIS — R0600 Dyspnea, unspecified: Secondary | ICD-10-CM

## 2021-05-13 NOTE — Patient Instructions (Addendum)
Medication Instructions:  ?Your physician recommends that you continue on your current medications as directed. Please refer to the Current Medication list given to you today. ? ?*If you need a refill on your cardiac medications before your next appointment, please call your pharmacy* ? ?Lab Work: ?NONE ordered at this time of appointment  ? ?If you have labs (blood work) drawn today and your tests are completely normal, you will receive your results only by: ?MyChart Message (if you have MyChart) OR ?A paper copy in the mail ?If you have any lab test that is abnormal or we need to change your treatment, we will call you to review the results. ? ?Testing/Procedures: ?Non-Cardiac CT Angiography (CTA), is a special type of CT scan that uses a computer to produce multi-dimensional views of major blood vessels throughout the body. In CT angiography, a contrast material is injected through an IV to help visualize the blood vessels  ? ?Your physician has requested that you have an echocardiogram. Echocardiography is a painless test that uses sound waves to create images of your heart. It provides your doctor with information about the size and shape of your heart and how well your heart?s chambers and valves are working. This procedure takes approximately one hour. There are no restrictions for this procedure. ? ?Your physician has requested that you have a lower extremity venous duplex. This test is an ultrasound of the veins in the legs. It looks at venous blood flow that carries blood from the heart to the legs. Allow one hour for a Lower Venous exam. Allow thirty minutes for an Upper Venous exam. There are no restrictions or special instructions. ? ? ? ?Follow-Up: ?At [facility], you and your health needs are our priority.  As part of our continuing mission to provide you with exceptional heart care, we have created designated Provider Care Teams.  These Care Teams include your primary Cardiologist (physician) and  Advanced Practice Providers (APPs -  Physician Assistants and Nurse Practitioners) who all work together to provide you with the care you need, when you need it. ? ?Your next appointment:   ?3 month(s) ? ?The format for your next appointment:   ?In Person ? ?Provider:   ?Kirk Ruths, MD   ? ? ?Other Instructions ? ? ?Important Information About Sugar ? ? ? ? ? ? ?

## 2021-05-13 NOTE — Progress Notes (Signed)
?Cardiology Office Note:   ? ?Date:  05/15/2021  ? ?ID:  Jason Mariner., DOB 05/28/1960, MRN [id number] ? ?PCP:  Pcp, No ?  ?[facility] Providers ?Cardiologist:  Kirk Ruths, MD    ? ?Referring MD: No ref. provider found  ? ?Chief Complaint  ?Patient presents with  ? Follow-up  ?  Seen for Dr. [name]  ? ? ?History of Present Illness:   ? ?Jason Savas. is a 61 y.o. male with a hx of hypertension, ESRD on HD TThS, pulmonary hypertension, history of congestive heart failure and noncompliance with medication and dialysis.  Patient was seen in January 2019 for elevated troponin.  Echocardiogram at the time showed EF 35 to 40%, moderate diastolic dysfunction, mild MR, severe biatrial enlargement, mild to moderate TR, moderate Pulmonary hypertension.  Cardiomyopathy was felt to be likely secondary to uncontrolled hypertension.  He was also noted to have nonsustained VT.  Cardiac catheterization was recommended however patient declined.  He was treated with medication.  Follow-up echocardiogram in August 2019 showed EF 50 to 55%, moderate right ventricular enlargement.  He was previously seen by [facility] for consideration of renal transplant.  A stress echo obtained at [facility] in January 2020 showed no ST changes, normal left ventricular function at rest, no inducible ischemia.  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  He has only been seen via virtual visit since July 2020.  Echocardiogram obtained in February 2021 showed EF 50 to 55%, grade 1 DD, elevated LVEDP, mild MR, mild aortic stenosis.  Patient was last seen via virtual visit by Kerin Ransom, PA-C on 05/21/2019 at which time he was doing well from the cardiac perspective.  Patient has been to the hospital multiple times due to missed dialysis.  He was admitted in August 2022 with missed dialysis and hypertensive  urgency, systolic blood pressure of 230.  Potassium was 6.  He underwent emergent dialysis with 4 L removed.  He was admitted to the hospital again in January 2023 and March 2023 due to missed dialysis.  He was in the ED in February 2023 due to hypertension with systolic blood pressure over 200.  Most recently, patient was admitted in April 2023 due to left knee swelling and pain.  He received Lokelma for hyperkalemia.  He was kept in the hospital for dialysis on 5/1.  He required upwards of 4 L nasal cannula with ambulation however reportedly only required oxygen as needed at home.  Chest x-ray showed stable left mediastinal contour abnormality, grossly unchanged dating back to 2019, however warrant CT evaluation.  Recommended CT of chest with contrast. ? ?Patient presents today for follow-up.  For the past week, he has been having significant left knee swelling.  He has also been having some shortness of breath as well.  Patient is currently on 2 L/min oxygen.  According to patient, he has never required oxygen in the past.  It is unclear to me why he suddenly require oxygen therapy due to shortness of breath.  Given the left knee swelling, I am concerned of DVT and PE.  I recommended venous Doppler and CTA of the chest.  I also recommended echocardiogram to reassess his ejection fraction. ? ?When we took him off of oxygen and ambulate him in the office, his O2 saturation dropped down to 87% without oxygen.  He says he is currently using oxygen on a as needed basis. ? ?Past Medical History:  ?Diagnosis Date  ? A-V fistula ([place])   ? Right arm  ? Abdominal hernia   ? AKI (acute kidney injury) ([place]) 01/2015  ? Anasarca   ? Anemia   ? Asthma   ? as a child  ? Bilateral inguinal hernia   ? Bilateral pleural effusion 01/2017  ? Cellulitis 01/16/2017  ? Bilateral lower extremity  ? CHF (congestive heart failure) ([place])   ? Chronic venous insufficiency   ? Concentric left ventricular hypertrophy 08/17/2017  ? Moderated,  noted on ECHO  ? Diastolic dysfunction 49/70/2637  ? noted on ECHO  ? Elevated LFTs   ? per patient, resolved  ? End stage renal disease ([place])   ? Dialysis T/Th/Sa  Started 02/2017/pt is waiting for a kidney transplant  ? H/O right inguinal hernia repair 11/2017  ? History of colonoscopy 03/20/2018  ? History of elevated PSA 10/2017  ? Hypertension   ? Pneumonia   ? Pulmonary hypertension ([place])   ? Moderate  ? Venous stasis ulcer ([place])   ? bil legs/ healed up now per pt (03/06/18)  ? ? ?Past Surgical History:  ?Procedure Laterality Date  ? AV FISTULA PLACEMENT Right 01/20/2017  ? Procedure: ARTERIOVENOUS (AV) FISTULA CREATION;  Surgeon: Angelia Mould, MD;  Location: [place];  Service: Vascular;  Laterality: Right;  ? HERNIA REPAIR    ? INSERTION OF DIALYSIS CATHETER Right 01/20/2017  ? Procedure: INSERTION OF DIALYSIS CATHETER;  Surgeon: Angelia Mould, MD;  Location: [place];  Service: Vascular;  Laterality: Right;  ? INSERTION OF MESH N/A 11/18/2017  ? Procedure: INSERTION OF MESH;  Surgeon: Michael Boston, MD;  Location: [facility];  Service: General;  Laterality: N/A;  ? IR FLUORO GUIDE CV LINE RIGHT  01/18/2017  ? IR LUMBAR DISC ASPIRATION W/IMG GUIDE  07/21/2020  ? IR US GUIDE VASC ACCESS RIGHT  01/18/2017  ? LAPAROSCOPIC INGUINAL HERNIA WITH UMBILICAL HERNIA Bilateral 11/18/2017  ? Procedure: LAPAROSCOPIC BILATERAL INGUINAL HERNIA REPAIR WITH UMBILICAL HERNIA REPAIR WITH MESH;  Surgeon: Michael Boston, MD;  Location: [facility];  Service: General;  Laterality: Bilateral;  ? TOE SURGERY    ? right fracture in big toe  ? TONSILLECTOMY    ? ? ?Current Medications: ?Current Meds  ?Medication Sig  ? amLODipine (NORVASC) 10 MG tablet Take 10 mg by mouth every morning.  ? carvedilol (COREG) 25 MG tablet Take 1 tablet (25 mg total) by mouth 2 (two) times daily with a meal.  ? lanthanum (FOSRENOL) 1000 MG chewable tablet Chew 1,000 mg by mouth 3 (three) times daily with meals.  ? multivitamin (RENA-VIT) TABS tablet Take 1  tablet by mouth every morning.  ? OXYGEN Inhale into the lungs. 3 liters at bedtime, But as needed depending on what the patient is doing.  ? polyvinyl alcohol (LIQUIFILM TEARS) 1.4 % ophthalmic solution Place 1 drop into both eyes daily as needed for dry eyes.  ?  ? ?Allergies:   Lisinopril  ? ?Social History  ? ?Socioeconomic History  ? Marital status: Single  ?  Spouse name: Not on file  ? Number of children: Not on file  ? Years of education: Not on file  ? Highest education level: Not on file  ?Occupational History  ? Not on file  ?Tobacco Use  ? Smoking status: Former  ?  Years: 17.00  ?  Types: Cigarettes  ?  Quit date: 1997  ?  Years since quitting: 26.3  ? Smokeless tobacco: Never  ? Tobacco comments:  ?  quit smoking in 1997  ?Vaping Use  ? Vaping Use: Never used  ?Substance and Sexual Activity  ? Alcohol use: Not Currently  ?  Comment: every several months, rare  ? Drug use: Not Currently  ?  Comment: marijuana  ? Sexual activity: Not Currently  ?Other Topics Concern  ? Not on file  ?Social History Narrative  ? Not on file  ? ?Social Determinants of Health  ? ?Financial Resource Strain: Not on file  ?Food Insecurity: Not on file  ?Transportation Needs: No Transportation Needs  ? Lack of Transportation (Medical): No  ? Lack of Transportation (Non-Medical): No  ?Physical Activity: Not on file  ?Stress: Not on file  ?Social Connections: Not on file  ?  ? ?Family History: ?The patient's family history includes Diabetes in his father; Healthy in his brother; Heart attack (age of onset: 16) in his mother; Heart disease in his mother; Hypertension in his mother; Prostate cancer in his maternal grandfather; Stroke in his mother. There is no history of Colon cancer, Colon polyps, Esophageal cancer, Stomach cancer, or Rectal cancer. ? ?ROS:   ?Please see the history of present illness.    ? All other systems reviewed and are negative. ? ?EKGs/Labs/Other Studies Reviewed:   ? ?The following studies were reviewed  today: ? ?Echo 02/18/2019 ? 1. Left ventricular ejection fraction, by visual estimation, is 50 to  ?55%. The left ventricle has low normal function. There is moderately  ?increased left ventricular hypertrophy.  ?

## 2021-05-14 DIAGNOSIS — N186 End stage renal disease: Secondary | ICD-10-CM | POA: Diagnosis not present

## 2021-05-14 DIAGNOSIS — N2581 Secondary hyperparathyroidism of renal origin: Secondary | ICD-10-CM | POA: Diagnosis not present

## 2021-05-14 DIAGNOSIS — Z992 Dependence on renal dialysis: Secondary | ICD-10-CM | POA: Diagnosis not present

## 2021-05-15 ENCOUNTER — Ambulatory Visit (HOSPITAL_COMMUNITY)
Admission: RE | Admit: 2021-05-15 | Discharge: 2021-05-15 | Disposition: A | Discharge status: Home / Self Care | Payer: Medicare HMO | Source: Ambulatory Visit | Attending: Cardiovascular Disease | Admitting: Cardiovascular Disease

## 2021-05-15 ENCOUNTER — Encounter: Payer: Self-pay | Admitting: Physician Assistant

## 2021-05-15 DIAGNOSIS — R0600 Dyspnea, unspecified: Secondary | ICD-10-CM | POA: Diagnosis not present

## 2021-05-15 NOTE — Progress Notes (Signed)
No evidence of DVT noted

## 2021-05-16 DIAGNOSIS — N186 End stage renal disease: Secondary | ICD-10-CM | POA: Diagnosis not present

## 2021-05-16 DIAGNOSIS — N2581 Secondary hyperparathyroidism of renal origin: Secondary | ICD-10-CM | POA: Diagnosis not present

## 2021-05-16 DIAGNOSIS — Z992 Dependence on renal dialysis: Secondary | ICD-10-CM | POA: Diagnosis not present

## 2021-05-19 DIAGNOSIS — N186 End stage renal disease: Secondary | ICD-10-CM | POA: Diagnosis not present

## 2021-05-19 DIAGNOSIS — E875 Hyperkalemia: Secondary | ICD-10-CM | POA: Diagnosis not present

## 2021-05-19 DIAGNOSIS — M25562 Pain in left knee: Secondary | ICD-10-CM | POA: Diagnosis not present

## 2021-05-19 DIAGNOSIS — N2581 Secondary hyperparathyroidism of renal origin: Secondary | ICD-10-CM | POA: Diagnosis not present

## 2021-05-19 DIAGNOSIS — Z992 Dependence on renal dialysis: Secondary | ICD-10-CM | POA: Diagnosis not present

## 2021-05-19 DIAGNOSIS — J9600 Acute respiratory failure, unspecified whether with hypoxia or hypercapnia: Secondary | ICD-10-CM | POA: Diagnosis not present

## 2021-05-21 DIAGNOSIS — Z992 Dependence on renal dialysis: Secondary | ICD-10-CM | POA: Diagnosis not present

## 2021-05-21 DIAGNOSIS — N2581 Secondary hyperparathyroidism of renal origin: Secondary | ICD-10-CM | POA: Diagnosis not present

## 2021-05-21 DIAGNOSIS — N186 End stage renal disease: Secondary | ICD-10-CM | POA: Diagnosis not present

## 2021-05-22 ENCOUNTER — Ambulatory Visit (HOSPITAL_COMMUNITY)
Admission: RE | Admit: 2021-05-22 | Discharge: 2021-05-22 | Disposition: A | Discharge status: Home / Self Care | Payer: Medicare HMO | Source: Ambulatory Visit | Attending: Physician Assistant | Admitting: Physician Assistant

## 2021-05-22 ENCOUNTER — Telehealth: Payer: Self-pay | Admitting: Physician Assistant

## 2021-05-22 DIAGNOSIS — R0600 Dyspnea, unspecified: Secondary | ICD-10-CM | POA: Diagnosis not present

## 2021-05-22 DIAGNOSIS — R079 Chest pain, unspecified: Secondary | ICD-10-CM | POA: Insufficient documentation

## 2021-05-22 IMAGING — CT CT ANGIO CHEST
2 of 7 series · 17 of 46 positions shown · IV contrast (APPLIED)
Comparison: None Available.

CLINICAL DATA: Chest pain.

EXAM:
CT ANGIOGRAPHY CHEST WITH CONTRAST
TECHNIQUE: Multidetector CT imaging of the chest was performed using the
standard protocol during bolus administration of intravenous
contrast. Multiplanar CT image reconstructions and MIPs were
obtained to evaluate the vascular anatomy.

[Series 5: thins · axial · 0.75mm/px · z∈[-341,-32]mm · 15 of 355 slices shown]
[im 23/355  lung]
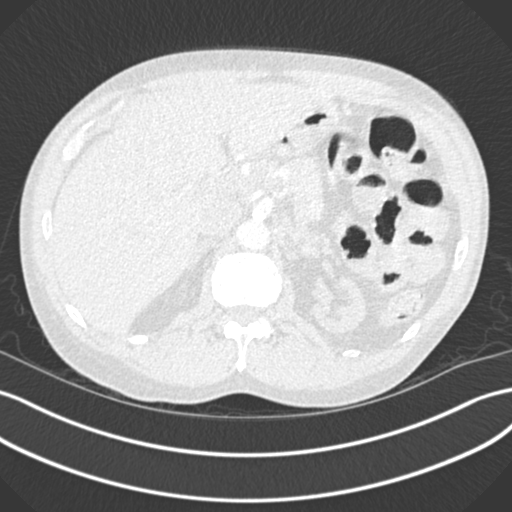
[im 45/355  soft-tissue]
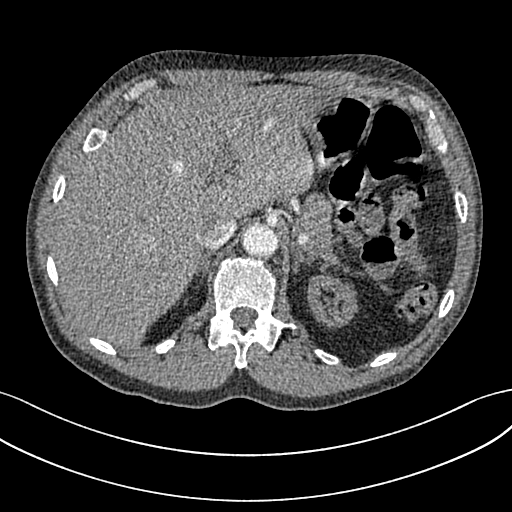
[im 67/355  lung]
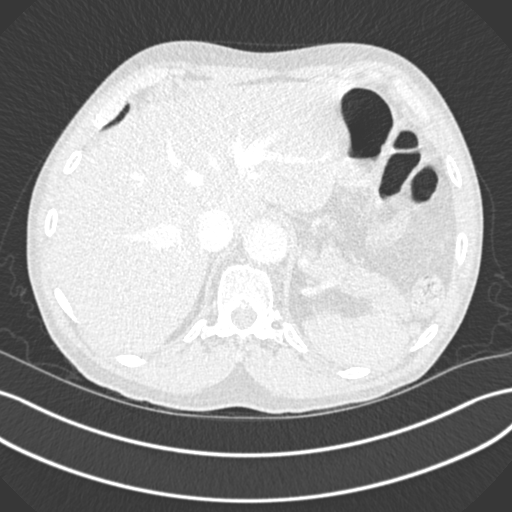
[im 89/355  soft-tissue]
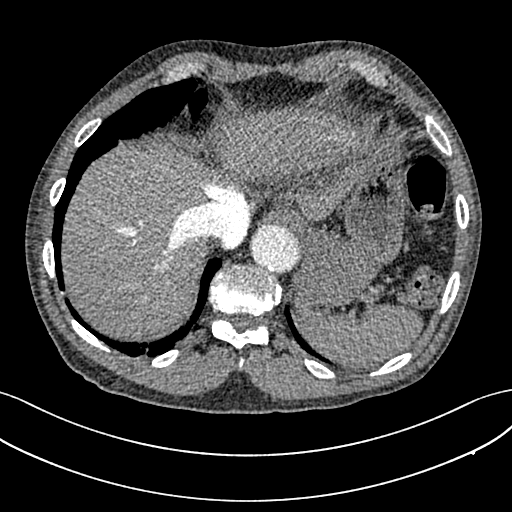
[im 111/355  lung]
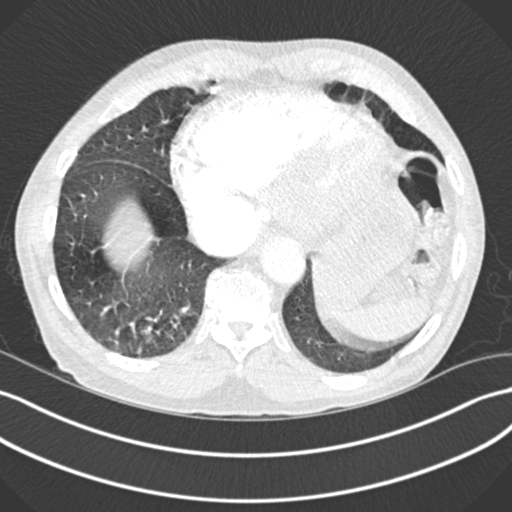
[im 133/355  soft-tissue]
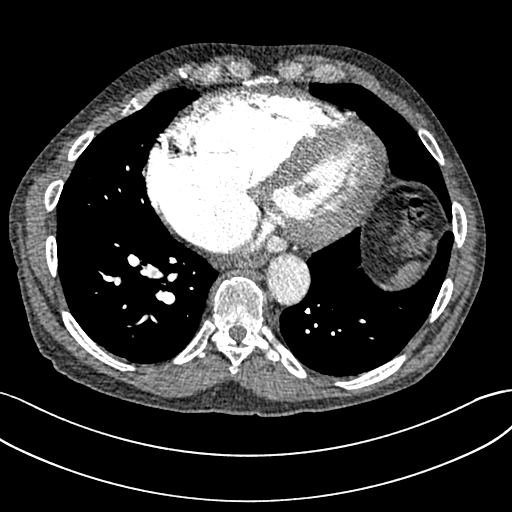
[im 155/355  lung]
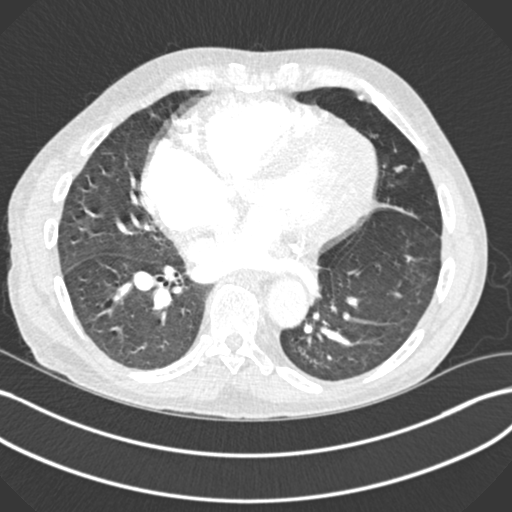
[im 178/355  soft-tissue]
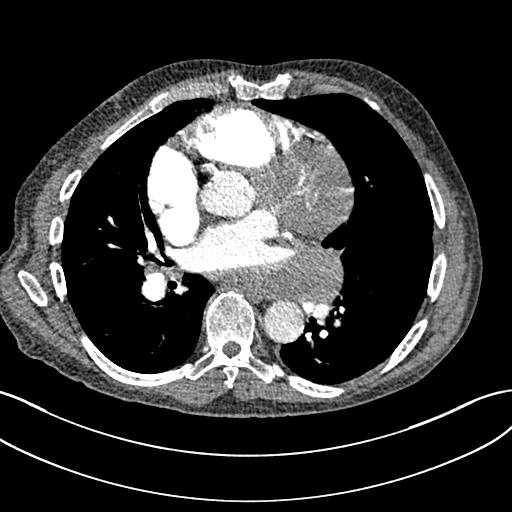
[im 200/355  lung]
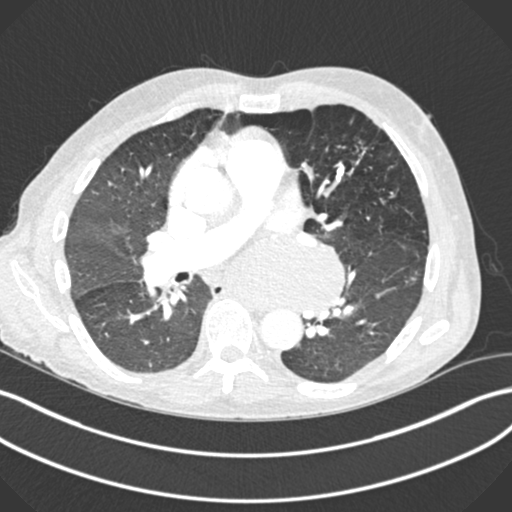
[im 222/355  soft-tissue]
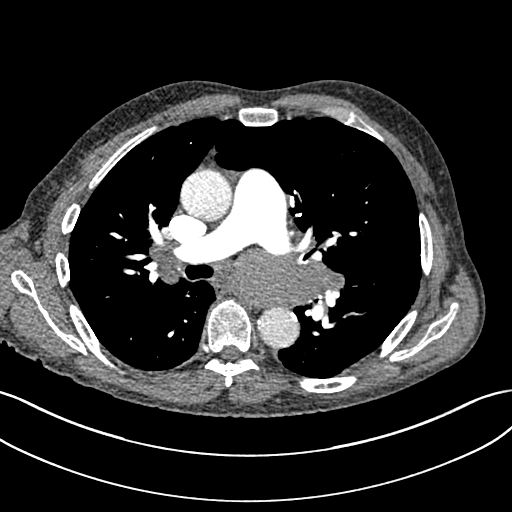
[im 244/355  lung]
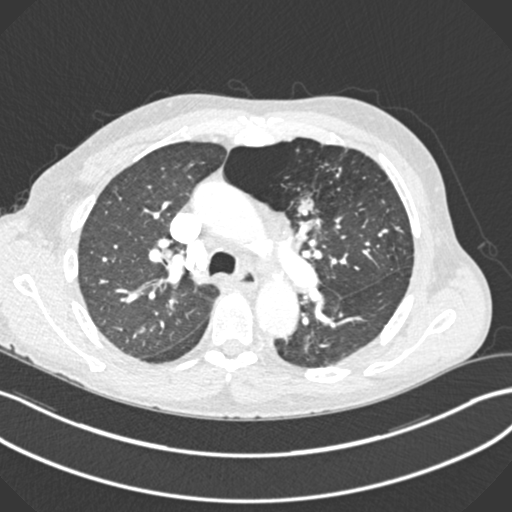
[im 266/355  soft-tissue]
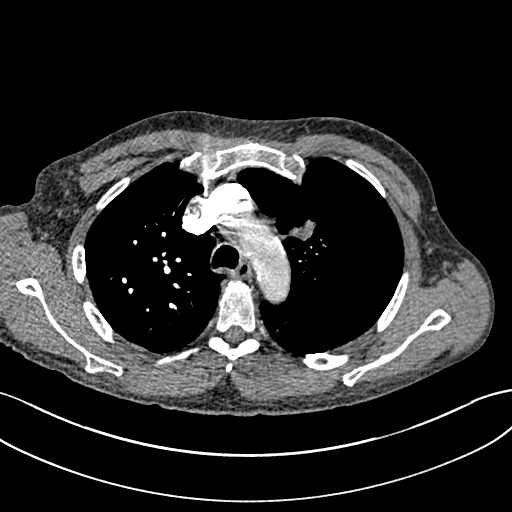
[im 288/355  lung]
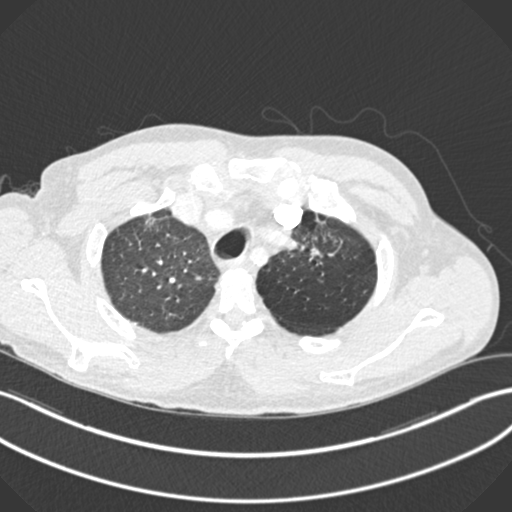
[im 310/355  soft-tissue]
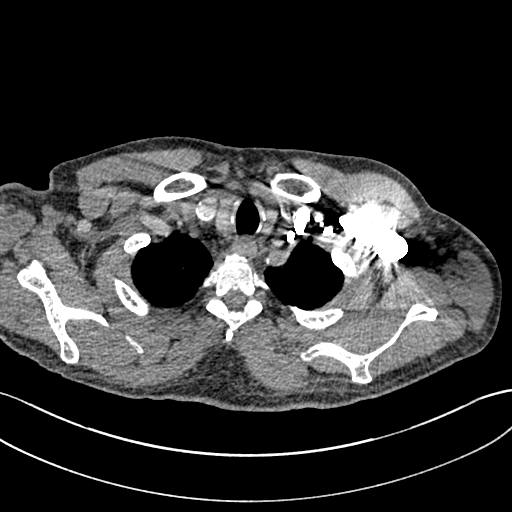
[im 332/355  lung]
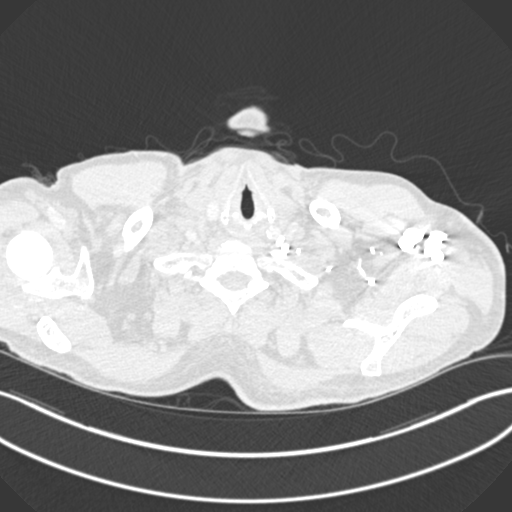

[Series 7: coronal mpr · coronal · 0.73mm/px · 2 of 101 slices shown]
[im 34/101  soft-tissue]
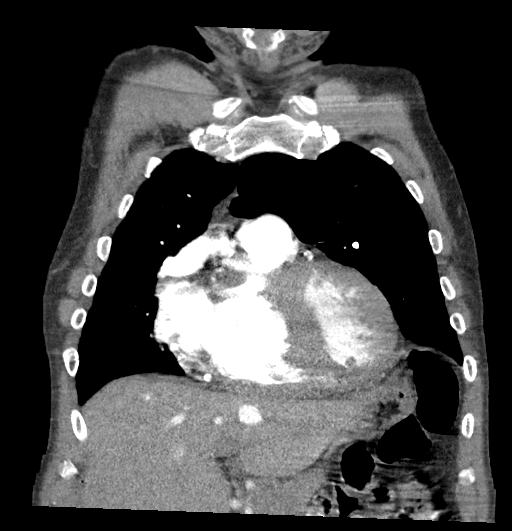
[im 67/101  soft-tissue]
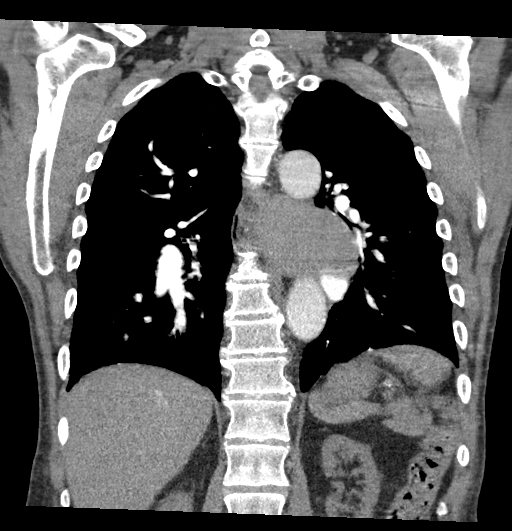

[17 of 46 positions shown; findings below may reference images not displayed]

RADIATION DOSE REDUCTION: This exam was performed according to the
departmental dose-optimization program which includes automated
exposure control, adjustment of the mA and/or kV according to
patient size and/or use of iterative reconstruction technique.

CONTRAST:  75mL OMNIPAQUE IOHEXOL 350 MG/ML SOLN
FINDINGS: Cardiovascular: Satisfactory opacification of the pulmonary arteries
to the segmental level. No evidence of pulmonary embolism. Normal
heart size. No pericardial effusion. Atherosclerosis of thoracic
aorta is noted without aneurysm formation.

Mediastinum/Nodes: Thyroid gland and esophagus are unremarkable.
x 5.5 cm oval-shaped mass with peripheral calcifications is seen in
the left hilar and subcarinal region, which displaces the esophagus
to the right. It demonstrates an average Hounsfield measurement of
63. Enlarged right hilar adenopathy is also noted measuring 1.9 cm.

Lungs/Pleura: No pneumothorax or pleural effusion is noted.
Emphysematous disease is noted with large bulla formation involving
the left upper lobe anteriorly. 27 x 16 mm solid density is noted in
the left suprahilar region concerning for possible mass or nodule.
Minimal right posterior basilar subsegmental atelectasis or scarring
is noted. Probable scarring is noted in the left upper lobe is well.

Upper Abdomen: No acute abnormality.

Musculoskeletal: No chest wall abnormality. No acute or significant
osseous findings.

Review of the MIP images confirms the above findings.
IMPRESSION: No definite evidence of pulmonary embolus.

8.9 x 5.5 cm mass is noted in the left hilar and subcarinal region
which demonstrates peripheral calcifications. Also noted is enlarged
right hilar adenopathy. While this large mass may represent
hyperdense bronchial cyst, malignancy or metastatic disease cannot
be excluded, particularly given the presence of right hilar
adenopathy. PET scan is recommended for further evaluation.

Also noted is 2.7 x 1.6 cm solid density in the left suprahilar
region concerning for possible mass or nodule.

Aortic Atherosclerosis ([IE]-[IE]) and Emphysema ([IE]-[IE]).

These results will be called to the ordering clinician or
representative by the Radiologist Assistant, and communication
documented in the PACS or zVision Dashboard.

## 2021-05-22 MED ORDER — SODIUM CHLORIDE (PF) 0.9 % IJ SOLN
INTRAMUSCULAR | Status: AC
  Filled 2021-05-22: qty 50

## 2021-05-22 MED ORDER — IOHEXOL 350 MG/ML SOLN
75.0000 mL | Freq: Once | INTRAVENOUS | Status: AC | PRN
  Administered 2021-05-22: 75 mL via INTRAVENOUS

## 2021-05-22 NOTE — Telephone Encounter (Signed)
-  [facility]iology called to report the following findings ?-Will forward to PA for review ? ? ?8.9 x 5.5 cm mass is noted in the left hilar and subcarinal region ?which demonstrates peripheral calcifications. Also noted is enlarged ?right hilar adenopathy. While this large mass may represent ?hyperdense bronchial cyst, malignancy or metastatic disease cannot ?be excluded, particularly given the presence of right hilar ?adenopathy. PET scan is recommended for further evaluation. ?

## 2021-05-22 NOTE — Telephone Encounter (Signed)
[facility] is calling to give CT report.  ?

## 2021-05-25 ENCOUNTER — Other Ambulatory Visit (HOSPITAL_BASED_OUTPATIENT_CLINIC_OR_DEPARTMENT_OTHER): Payer: Medicare HMO

## 2021-05-26 DIAGNOSIS — Z992 Dependence on renal dialysis: Secondary | ICD-10-CM | POA: Diagnosis not present

## 2021-05-26 DIAGNOSIS — N186 End stage renal disease: Secondary | ICD-10-CM | POA: Diagnosis not present

## 2021-05-26 DIAGNOSIS — N2581 Secondary hyperparathyroidism of renal origin: Secondary | ICD-10-CM | POA: Diagnosis not present

## 2021-05-27 ENCOUNTER — Ambulatory Visit: Payer: Self-pay

## 2021-05-30 ENCOUNTER — Other Ambulatory Visit: Payer: Self-pay

## 2021-05-30 ENCOUNTER — Emergency Department (HOSPITAL_COMMUNITY)
Admission: EM | Admit: 2021-05-30 | Discharge: 2021-05-31 | Disposition: A | Discharge status: Home / Self Care | Payer: Medicare HMO | Attending: Emergency Medicine | Admitting: Emergency Medicine

## 2021-05-30 ENCOUNTER — Emergency Department (HOSPITAL_COMMUNITY): Payer: Medicare HMO

## 2021-05-30 ENCOUNTER — Encounter (HOSPITAL_COMMUNITY): Payer: Self-pay | Admitting: Emergency Medicine

## 2021-05-30 DIAGNOSIS — I132 Hypertensive heart and chronic kidney disease with heart failure and with stage 5 chronic kidney disease, or end stage renal disease: Secondary | ICD-10-CM | POA: Diagnosis not present

## 2021-05-30 DIAGNOSIS — E875 Hyperkalemia: Secondary | ICD-10-CM

## 2021-05-30 DIAGNOSIS — R600 Localized edema: Secondary | ICD-10-CM | POA: Insufficient documentation

## 2021-05-30 DIAGNOSIS — Z992 Dependence on renal dialysis: Secondary | ICD-10-CM | POA: Insufficient documentation

## 2021-05-30 DIAGNOSIS — R9431 Abnormal electrocardiogram [ECG] [EKG]: Secondary | ICD-10-CM | POA: Diagnosis not present

## 2021-05-30 DIAGNOSIS — I509 Heart failure, unspecified: Secondary | ICD-10-CM | POA: Diagnosis not present

## 2021-05-30 DIAGNOSIS — Z79899 Other long term (current) drug therapy: Secondary | ICD-10-CM | POA: Insufficient documentation

## 2021-05-30 DIAGNOSIS — N186 End stage renal disease: Secondary | ICD-10-CM | POA: Insufficient documentation

## 2021-05-30 DIAGNOSIS — M25562 Pain in left knee: Secondary | ICD-10-CM | POA: Insufficient documentation

## 2021-05-30 DIAGNOSIS — I517 Cardiomegaly: Secondary | ICD-10-CM | POA: Diagnosis not present

## 2021-05-30 DIAGNOSIS — R911 Solitary pulmonary nodule: Secondary | ICD-10-CM | POA: Diagnosis not present

## 2021-05-30 DIAGNOSIS — R590 Localized enlarged lymph nodes: Secondary | ICD-10-CM | POA: Diagnosis not present

## 2021-05-30 DIAGNOSIS — Z91158 Patient's noncompliance with renal dialysis for other reason: Secondary | ICD-10-CM

## 2021-05-30 LAB — CBC WITH DIFFERENTIAL/PLATELET
Abs Immature Granulocytes: 0.02 10*3/uL (ref 0.00–0.07)
Basophils Absolute: 0 10*3/uL (ref 0.0–0.1)
Basophils Relative: 1 %
Eosinophils Absolute: 0.5 10*3/uL (ref 0.0–0.5)
Eosinophils Relative: 9 %
HCT: 32 % — ABNORMAL LOW (ref 39.0–52.0)
Hemoglobin: 11 g/dL — ABNORMAL LOW (ref 13.0–17.0)
Immature Granulocytes: 0 %
Lymphocytes Relative: 10 %
Lymphs Abs: 0.5 10*3/uL — ABNORMAL LOW (ref 0.7–4.0)
MCH: 31.8 pg (ref 26.0–34.0)
MCHC: 34.4 g/dL (ref 30.0–36.0)
MCV: 92.5 fL (ref 80.0–100.0)
Monocytes Absolute: 0.5 10*3/uL (ref 0.1–1.0)
Monocytes Relative: 9 %
Neutro Abs: 3.7 10*3/uL (ref 1.7–7.7)
Neutrophils Relative %: 71 %
Platelets: 167 10*3/uL (ref 150–400)
RBC: 3.46 MIL/uL — ABNORMAL LOW (ref 4.22–5.81)
RDW: 15.3 % (ref 11.5–15.5)
WBC: 5.3 10*3/uL (ref 4.0–10.5)
nRBC: 0 % (ref 0.0–0.2)

## 2021-05-30 LAB — COMPREHENSIVE METABOLIC PANEL
ALT: 25 U/L (ref 0–44)
AST: 20 U/L (ref 15–41)
Albumin: 3.1 g/dL — ABNORMAL LOW (ref 3.5–5.0)
Alkaline Phosphatase: 41 U/L (ref 38–126)
Anion gap: 22 — ABNORMAL HIGH (ref 5–15)
BUN: 119 mg/dL — ABNORMAL HIGH (ref 6–20)
CO2: 18 mmol/L — ABNORMAL LOW (ref 22–32)
Calcium: 8.2 mg/dL — ABNORMAL LOW (ref 8.9–10.3)
Chloride: 97 mmol/L — ABNORMAL LOW (ref 98–111)
Creatinine, Ser: 15.75 mg/dL — ABNORMAL HIGH (ref 0.61–1.24)
GFR, Estimated: 3 mL/min — ABNORMAL LOW (ref >60)
Glucose, Bld: 128 mg/dL — ABNORMAL HIGH (ref 70–99)
Potassium: 5.6 mmol/L — ABNORMAL HIGH (ref 3.5–5.1)
Sodium: 137 mmol/L (ref 135–145)
Total Bilirubin: 0.8 mg/dL (ref 0.3–1.2)
Total Protein: 6.3 g/dL — ABNORMAL LOW (ref 6.5–8.1)

## 2021-05-30 LAB — MAGNESIUM: Magnesium: 2.6 mg/dL — ABNORMAL HIGH (ref 1.7–2.4)

## 2021-05-30 IMAGING — DX DG CHEST 1V PORT
1 series · 1 of 1 positions shown · non-contrast
Comparison: Chest radiograph [DATE], CT [DATE]

CLINICAL DATA: Missed dialysis.

EXAM:
PORTABLE CHEST 1 VIEW

[chest]
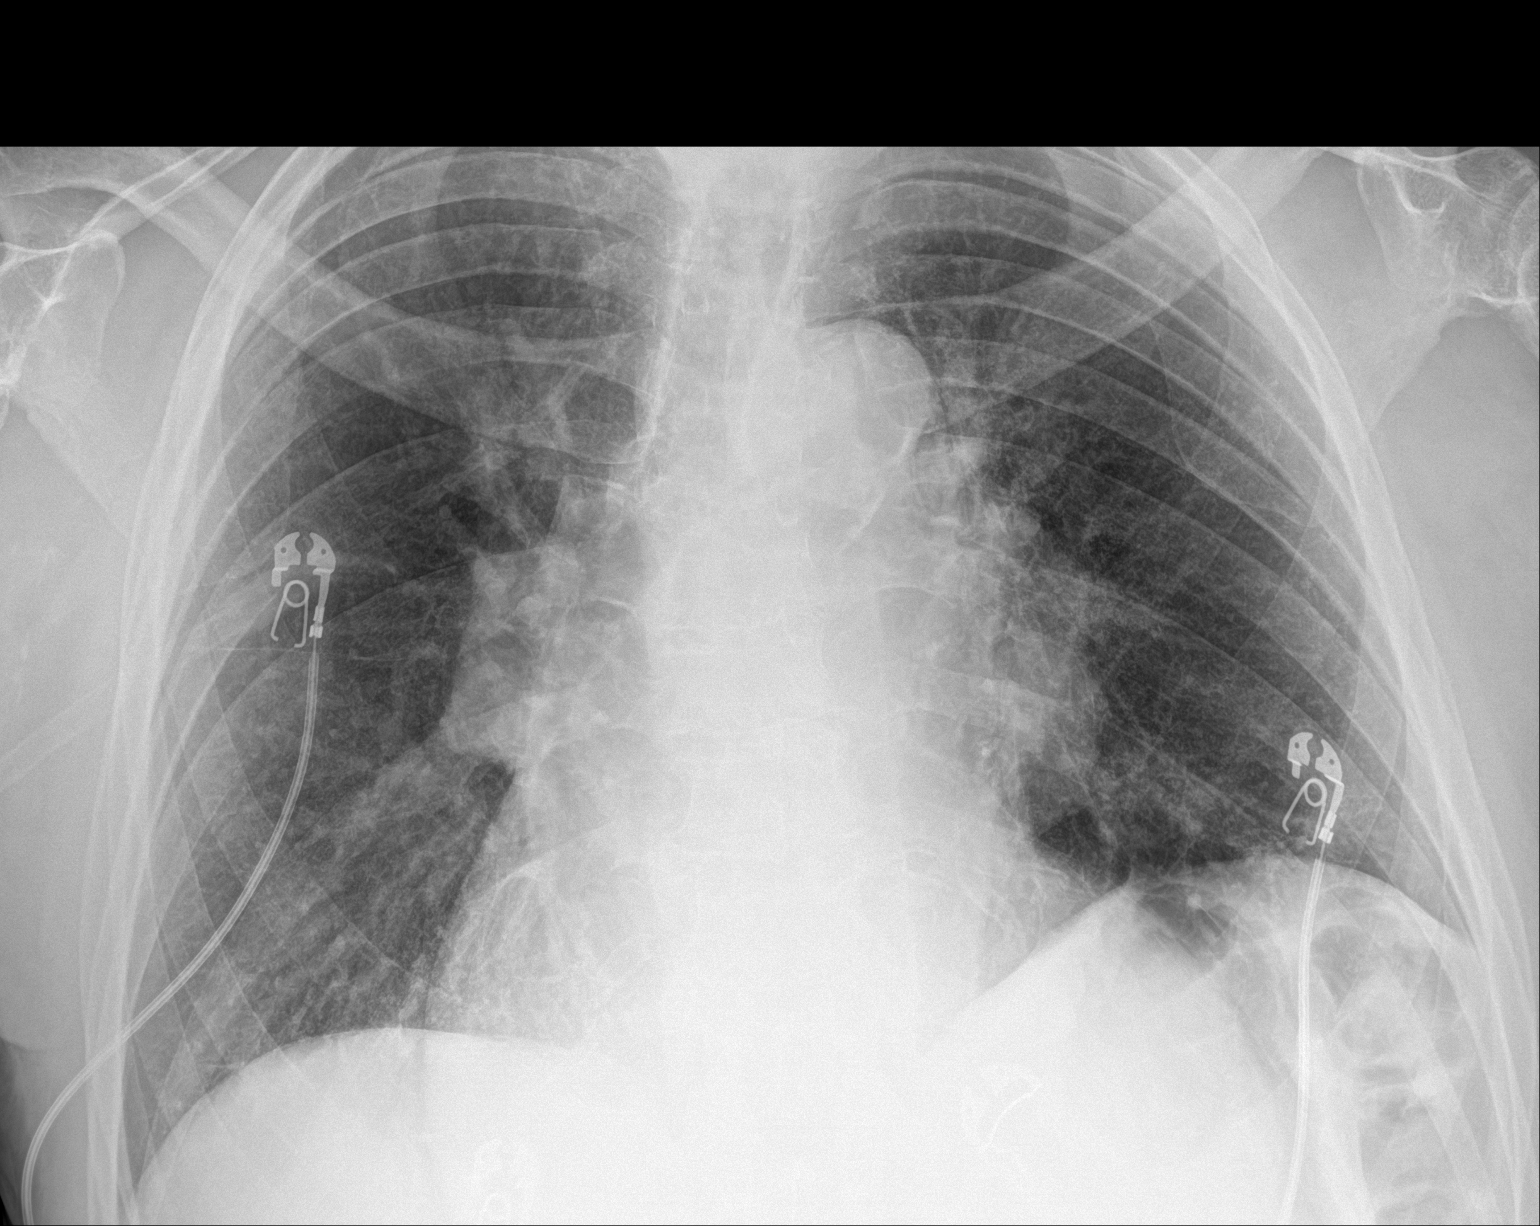

[1 of 1 positions shown; findings below may reference images not displayed]

FINDINGS: Again seen cardiomegaly. Bilateral hilar enlargement, left greater
than right, corresponding to adenopathy and mediastinal mass on
prior CT. The left suprahilar nodule on prior CT is not well
demonstrated on the current exam. Mild vascular congestion without
pulmonary edema. No pleural fluid. Chronic elevation of left
hemidiaphragm. No pneumothorax.
IMPRESSION: 1. Cardiomegaly. Mild vascular congestion.
2. Unchanged bilateral hilar enlargement, corresponding to
adenopathy and mediastinal mass on prior CT.

## 2021-05-30 MED ORDER — HEPARIN SODIUM (PORCINE) 1000 UNIT/ML DIALYSIS
1000.0000 [IU] | INTRAMUSCULAR | Status: DC | PRN
  Filled 2021-05-30: qty 1

## 2021-05-30 MED ORDER — SODIUM CHLORIDE 0.9 % IV SOLN: 100.0000 mL | INTRAVENOUS | Status: DC | PRN

## 2021-05-30 MED ORDER — CHLORHEXIDINE GLUCONATE CLOTH 2 % EX PADS: 6.0000 | MEDICATED_PAD | Freq: Every day | CUTANEOUS | Status: DC

## 2021-05-30 MED ORDER — PENTAFLUOROPROP-TETRAFLUOROETH EX AERO
1.0000 "application " | INHALATION_SPRAY | CUTANEOUS | Status: DC | PRN
  Filled 2021-05-30: qty 116

## 2021-05-30 MED ORDER — ALTEPLASE 2 MG IJ SOLR: 2.0000 mg | Freq: Once | INTRAMUSCULAR | Status: DC | PRN

## 2021-05-30 MED ORDER — LIDOCAINE HCL (PF) 1 % IJ SOLN: 5.0000 mL | INTRAMUSCULAR | Status: DC | PRN

## 2021-05-30 MED ORDER — LIDOCAINE-PRILOCAINE 2.5-2.5 % EX CREA
1.0000 "application " | TOPICAL_CREAM | CUTANEOUS | Status: DC | PRN
  Filled 2021-05-30: qty 5

## 2021-05-30 MED ORDER — HEPARIN SODIUM (PORCINE) 1000 UNIT/ML DIALYSIS
20.0000 [IU]/kg | INTRAMUSCULAR | Status: DC | PRN
  Filled 2021-05-30: qty 2

## 2021-05-30 NOTE — ED Notes (Signed)
Pt spO2 87% at rest with good pleth waveform. Placed on 2L [place], improved to 94%

## 2021-05-30 NOTE — Progress Notes (Signed)
Nephrology Brief note; Received call from ER. Pt missed HD OP, last HD on 5/15, now w/SOB and hypoxia. K 5.6 and BUN 119.  Dialysis Orders: [facility] T,Th,S  4:15 hr 200 NRe 400/800 76 kg 2.0 K/2.0 Ca AVF, EDW 76 kg.  Plan: HD tonight Pt will go back to ER after dialysis for re-assessment and possible discharge. Discussed with EDP and dialysis nurse.  DCarolin Sicks, CKA.

## 2021-05-30 NOTE — ED Provider Notes (Signed)
[facility] EMERGENCY DEPARTMENT Provider Note   CSN: [id number] Arrival date & time: 05/30/21  1801     History  Chief Complaint  Patient presents with   Missed dialysis   Knee Pain    Jason Mcminn. is a 61 y.o. male with a history of ESRD on TTS dialysis, CHF, HTN presenting to the ED after missing dialysis.  Patient states that he has been unable to attend dialysis both on Thursday and Saturday.  Unclear why he missed the sessions.  He does continue to endorse some chronic left knee pain which has been evaluated at the past (previous x-rays have been normal).  He states the left knee pain is unchanged and does improve with Tylenol "when I take it".  He denies any chest pain, shortness of breath, abdominal pain, nausea/vomiting/diarrhea, fevers.  Overall he states that he feels "fine".   Knee Pain Associated symptoms: no fever       Home Medications Prior to Admission medications   Medication Sig Start Date End Date Taking? Authorizing Provider  amLODipine (NORVASC) 10 MG tablet Take 10 mg by mouth every morning. 06/20/18   [provider]  carvedilol (COREG) 25 MG tablet Take 1 tablet (25 mg total) by mouth 2 (two) times daily with a meal. 02/02/18   [name], FNP  lanthanum (FOSRENOL) 1000 MG chewable tablet Chew 1,000 mg by mouth 3 (three) times daily with meals.    [provider]  multivitamin (RENA-VIT) TABS tablet Take 1 tablet by mouth every morning. 10/20/17   [provider]  OXYGEN Inhale into the lungs. 3 liters at bedtime, But as needed depending on what the patient is doing.    [provider]  polyvinyl alcohol (LIQUIFILM TEARS) 1.4 % ophthalmic solution Place 1 drop into both eyes daily as needed for dry eyes.    [provider]      Allergies    Lisinopril    Review of Systems   Review of Systems  Constitutional:  Negative for fever.  Respiratory:  Negative for cough and shortness of  breath.   Cardiovascular:  Positive for leg swelling (chronic). Negative for chest pain.  Gastrointestinal:  Negative for abdominal pain, diarrhea, nausea and vomiting.  Musculoskeletal:        Positive for left knee pain, chronic  Neurological:  Negative for syncope and headaches.   Physical Exam Updated Vital Signs BP (!) 158/90   Pulse 65   Temp 99 F (37.2 C) (Oral)   Resp (!) 23   Ht '6\' 2"'$  (1.88 m)   Wt 87.5 kg   SpO2 94%   BMI 24.77 kg/m  Physical Exam Vitals and nursing note reviewed.  Constitutional:      General: He is not in acute distress.    Appearance: Normal appearance. He is not ill-appearing, toxic-appearing or diaphoretic.  HENT:     Head: Normocephalic and atraumatic.     Right Ear: External ear normal.     Left Ear: External ear normal.     Nose: Nose normal.  Eyes:     General: No scleral icterus. Cardiovascular:     Rate and Rhythm: Normal rate and regular rhythm.     Heart sounds: Normal heart sounds. No murmur heard.   No friction rub. No gallop.  Pulmonary:     Effort: Pulmonary effort is normal. No respiratory distress.     Breath sounds: Normal breath sounds. No stridor. No wheezing, rhonchi or  rales.  Abdominal:     Palpations: Abdomen is soft.     Tenderness: There is no abdominal tenderness. There is no guarding or rebound.  Musculoskeletal:        General: No deformity.     Cervical back: Neck supple.     Right lower leg: Edema (1+) present.     Left lower leg: Edema (2+) present.     Comments: Left knee is swollen with palpable effusion but no overlying erythema or warmth. Pt has full rang of motion of the left knee. No focal tenderness to palpation.  Skin:    General: Skin is warm and dry.  Neurological:     General: No focal deficit present.     Mental Status: He is alert and oriented to person, place, and time.    ED Results / Procedures / Treatments   Labs (all labs ordered are listed, but only abnormal results are  displayed) Labs Reviewed  CBC WITH DIFFERENTIAL/PLATELET - Abnormal; Notable for the following components:      Result Value   RBC 3.46 (*)    Hemoglobin 11.0 (*)    HCT 32.0 (*)    Lymphs Abs 0.5 (*)    All other components within normal limits  COMPREHENSIVE METABOLIC PANEL - Abnormal; Notable for the following components:   Potassium 5.6 (*)    Chloride 97 (*)    CO2 18 (*)    Glucose, Bld 128 (*)    BUN 119 (*)    Creatinine, Ser 15.75 (*)    Calcium 8.2 (*)    Total Protein 6.3 (*)    Albumin 3.1 (*)    GFR, Estimated 3 (*)    Anion gap 22 (*)    All other components within normal limits  MAGNESIUM - Abnormal; Notable for the following components:   Magnesium 2.6 (*)    All other components within normal limits  HEPATITIS B SURFACE ANTIGEN  HEPATITIS B SURFACE ANTIBODY,QUALITATIVE  HEPATITIS B SURFACE ANTIBODY, QUANTITATIVE    EKG EKG Interpretation  Date/Time:  Saturday May 30 2021 19:28:57 EDT Ventricular Rate:  69 PR Interval:  175 QRS Duration: 131 QT Interval:  417 QTC Calculation: 447 R Axis:   156 Text Interpretation: Sinus rhythm Nonspecific intraventricular conduction delay Borderline repolarization abnormality similar to April 2023 Confirmed by Sherwood Gambler [phone]) on 05/30/2021 7:38:34 PM  Radiology DG Chest Portable 1 View  Result Date: 05/30/2021 CLINICAL DATA:  Missed dialysis. EXAM: PORTABLE CHEST 1 VIEW COMPARISON:  Chest radiograph 05/09/2021, CT 05/22/2021 FINDINGS: Again seen cardiomegaly. Bilateral hilar enlargement, left greater than right, corresponding to adenopathy and mediastinal mass on prior CT. The left suprahilar nodule on prior CT is not well demonstrated on the current exam. Mild vascular congestion without pulmonary edema. No pleural fluid. Chronic elevation of left hemidiaphragm. No pneumothorax. IMPRESSION: 1. Cardiomegaly. Mild vascular congestion. 2. Unchanged bilateral hilar enlargement, corresponding to adenopathy and  mediastinal mass on prior CT. Electronically Signed   By: Keith Rake M.D.   On: 05/30/2021 20:13    Procedures Procedures    Medications Ordered in ED Medications  Chlorhexidine Gluconate Cloth 2 % PADS 6 each (has no administration in time range)  pentafluoroprop-tetrafluoroeth (GEBAUERS) aerosol 1 application. (has no administration in time range)  lidocaine (PF) (XYLOCAINE) 1 % injection 5 mL (has no administration in time range)  lidocaine-prilocaine (EMLA) cream 1 application. (has no administration in time range)  0.9 %  sodium chloride infusion (has no administration in time range)  0.9 %  sodium chloride infusion (has no administration in time range)  heparin injection 1,000 Units (has no administration in time range)  alteplase (CATHFLO ACTIVASE) injection 2 mg (has no administration in time range)  heparin injection 1,600 Units (has no administration in time range)    ED Course/ Medical Decision Making/ A&P                           Medical Decision Making Amount and/or Complexity of Data Reviewed Labs: ordered. Radiology: ordered.   Jason Orman. is a 61 y.o. male with a history of ESRD on TTS dialysis, CHF, HTN presenting to the ED after missing dialysis.  On exam, the patient is afebrile and hemodynamically stable.  His oxygen saturations initially on arrival were reportedly 89% on room air, but on my evaluation he is satting 92% on room air with no respiratory distress.  Lungs are clear to auscultation.  He does have some swelling of the lower extremities, left greater than right.  He states that the swelling is slightly increased from his baseline, but his left leg is always more swollen than the right leg.  He does have some swelling over the left knee but without overlying erythema or warmth and he has full range of motion of the knee without any focal tenderness to palpation.  Given patient has missed 2 sessions of dialysis, will obtain CBC, CMP, magnesium,  and chest x-ray for further evaluation for possible urgent dialysis.  For the left knee, this has been a chronic problem for several weeks to months.  He has had an x-ray about 1 month ago that showed a small effusion but was otherwise normal.  I do not suspect that this is a septic joint as the patient has not had any fevers and has full range of motion without much pain on my evaluation.  Likely due to chronic degenerative changes/arthritis.  ECG showed a sinus rhythm with inverted T waves in leads II and III, but largely appears unchanged from prior.  While in the ED, patient was noted to be hypoxic to the upper 80s on room air and was initiated on 2 L via nasal cannula.  Chest x-ray is consistent with mild pulmonary edema.  CMP notable for hyperkalemia with a potassium of 5.6, bicarb of 18, and elevated BUN to 119.  CBC without leukocytosis and with a hemoglobin of 11.  Magnesium is elevated to 2.6.  Given the patient's hyperkalemia and uremia as well as his new oxygen requirement in the setting of likely fluid overload due to missing his dialysis sessions, nephrology was consulted.  They plan to dialyze him overnight with plan to reevaluate the patient in the ED after dialysis.  If his oxygen requirement resolves after dialysis, patient will likely be able to be discharged home.  Pt receiving HD at the time of sign-out. Patient care was signed out to Jason Etienne, PA '@[id number]'$ .  Final Clinical Impression(s) / ED Diagnoses Final diagnoses:  Hyperkalemia    Rx / DC Orders ED Discharge Orders     None         Sondra Come, MD 05/30/21 [id number]    Sherwood Gambler, MD 06/04/21 [phone]

## 2021-05-30 NOTE — ED Triage Notes (Signed)
Pt states he missed his last 2 dialysis treatments because of L knee pain.  Seen 4/29 for knee pain.  States he feels fine.  Wears 2-3 liters O2 at home as needed.

## 2021-05-31 LAB — HEPATITIS B SURFACE ANTIGEN: Hepatitis B Surface Ag: NONREACTIVE

## 2021-05-31 LAB — HEPATITIS B SURFACE ANTIBODY,QUALITATIVE: Hep B S Ab: NONREACTIVE

## 2021-05-31 NOTE — ED Provider Notes (Signed)
Received at shift change from Dr. Sondra Come please see her note for full detail  In short patient with medical history including end-stage renal disease on dialysis Tuesday Thursday Saturday CHF hypertension presents due to missed dialysis.   Per previous provider reassessed patient after dialysis if patient is on room air having no complaints he can be discharged home.  Lab work which I personally reviewed and interpreted-CBC shows normocytic anemia hemoglobin 11, CMP shows potassium 5.6, chloride 97, CO2 of 18, glucose 128, BUN 119 creatinine 15.7 and gap 22, mag 2.6,  Imaging which I personally viewed and interpreted-mild vascular congestion.  EKG which I personally reviewed and interpreted without signs of ischemia  Reassessment  Patient is found resting calmly, having no complaints, he is no longer on supplemental oxygen vital signs are reassuring, he is eating a sandwich and states he is ready to go home.  Disposition  Missed dialysis-secondary to noncompliance, will have him continue with all home medications and encouraged him to go to his dialysis treatments.   Marcello Fennel, PA-C 05/31/21 0340    Merrily Pew, MD 05/31/21 [phone]

## 2021-05-31 NOTE — Discharge Instructions (Signed)
Please continue with all home medications  Please follow-up with your normal dialysis treatments.  Come back to the emergency department if you develop chest pain, shortness of breath, severe abdominal pain, uncontrolled nausea, vomiting, diarrhea.

## 2021-06-01 ENCOUNTER — Other Ambulatory Visit (HOSPITAL_BASED_OUTPATIENT_CLINIC_OR_DEPARTMENT_OTHER): Payer: Medicare HMO

## 2021-06-02 ENCOUNTER — Other Ambulatory Visit: Payer: Self-pay | Admitting: *Deleted

## 2021-06-02 DIAGNOSIS — R918 Other nonspecific abnormal finding of lung field: Secondary | ICD-10-CM

## 2021-06-02 DIAGNOSIS — N2581 Secondary hyperparathyroidism of renal origin: Secondary | ICD-10-CM | POA: Diagnosis not present

## 2021-06-02 DIAGNOSIS — Z992 Dependence on renal dialysis: Secondary | ICD-10-CM | POA: Diagnosis not present

## 2021-06-02 DIAGNOSIS — N186 End stage renal disease: Secondary | ICD-10-CM | POA: Diagnosis not present

## 2021-06-02 LAB — HEPATITIS B SURFACE ANTIBODY, QUANTITATIVE: Hep B S AB Quant (Post): <3.1 m[IU]/mL — ABNORMAL LOW (ref >9.9)

## 2021-06-06 DIAGNOSIS — Z992 Dependence on renal dialysis: Secondary | ICD-10-CM | POA: Diagnosis not present

## 2021-06-06 DIAGNOSIS — N2581 Secondary hyperparathyroidism of renal origin: Secondary | ICD-10-CM | POA: Diagnosis not present

## 2021-06-06 DIAGNOSIS — N186 End stage renal disease: Secondary | ICD-10-CM | POA: Diagnosis not present

## 2021-06-10 DIAGNOSIS — I129 Hypertensive chronic kidney disease with stage 1 through stage 4 chronic kidney disease, or unspecified chronic kidney disease: Secondary | ICD-10-CM | POA: Diagnosis not present

## 2021-06-10 DIAGNOSIS — N186 End stage renal disease: Secondary | ICD-10-CM | POA: Diagnosis not present

## 2021-06-10 DIAGNOSIS — Z992 Dependence on renal dialysis: Secondary | ICD-10-CM | POA: Diagnosis not present

## 2021-06-10 DIAGNOSIS — N2581 Secondary hyperparathyroidism of renal origin: Secondary | ICD-10-CM | POA: Diagnosis not present

## 2021-06-11 DIAGNOSIS — N186 End stage renal disease: Secondary | ICD-10-CM | POA: Diagnosis not present

## 2021-06-11 DIAGNOSIS — N2581 Secondary hyperparathyroidism of renal origin: Secondary | ICD-10-CM | POA: Diagnosis not present

## 2021-06-11 DIAGNOSIS — Z992 Dependence on renal dialysis: Secondary | ICD-10-CM | POA: Diagnosis not present

## 2021-06-16 DIAGNOSIS — N186 End stage renal disease: Secondary | ICD-10-CM | POA: Diagnosis not present

## 2021-06-16 DIAGNOSIS — N2581 Secondary hyperparathyroidism of renal origin: Secondary | ICD-10-CM | POA: Diagnosis not present

## 2021-06-16 DIAGNOSIS — Z992 Dependence on renal dialysis: Secondary | ICD-10-CM | POA: Diagnosis not present

## 2021-06-23 DIAGNOSIS — Z992 Dependence on renal dialysis: Secondary | ICD-10-CM | POA: Diagnosis not present

## 2021-06-23 DIAGNOSIS — N2581 Secondary hyperparathyroidism of renal origin: Secondary | ICD-10-CM | POA: Diagnosis not present

## 2021-06-23 DIAGNOSIS — N186 End stage renal disease: Secondary | ICD-10-CM | POA: Diagnosis not present

## 2021-06-26 DIAGNOSIS — N2581 Secondary hyperparathyroidism of renal origin: Secondary | ICD-10-CM | POA: Diagnosis not present

## 2021-06-26 DIAGNOSIS — Z992 Dependence on renal dialysis: Secondary | ICD-10-CM | POA: Diagnosis not present

## 2021-06-26 DIAGNOSIS — N186 End stage renal disease: Secondary | ICD-10-CM | POA: Diagnosis not present

## 2021-07-01 ENCOUNTER — Encounter (HOSPITAL_COMMUNITY): Payer: Self-pay

## 2021-07-01 ENCOUNTER — Emergency Department (HOSPITAL_COMMUNITY): Payer: Medicare HMO

## 2021-07-01 ENCOUNTER — Other Ambulatory Visit: Payer: Self-pay

## 2021-07-01 ENCOUNTER — Emergency Department (HOSPITAL_COMMUNITY)
Admission: EM | Admit: 2021-07-01 | Discharge: 2021-07-02 | Disposition: A | Discharge status: Home / Self Care | Payer: Medicare HMO | Attending: Emergency Medicine | Admitting: Emergency Medicine

## 2021-07-01 DIAGNOSIS — R0602 Shortness of breath: Secondary | ICD-10-CM | POA: Diagnosis not present

## 2021-07-01 DIAGNOSIS — I509 Heart failure, unspecified: Secondary | ICD-10-CM | POA: Insufficient documentation

## 2021-07-01 DIAGNOSIS — Z992 Dependence on renal dialysis: Secondary | ICD-10-CM | POA: Insufficient documentation

## 2021-07-01 DIAGNOSIS — Z79899 Other long term (current) drug therapy: Secondary | ICD-10-CM | POA: Diagnosis not present

## 2021-07-01 DIAGNOSIS — N186 End stage renal disease: Secondary | ICD-10-CM | POA: Diagnosis not present

## 2021-07-01 DIAGNOSIS — I4891 Unspecified atrial fibrillation: Secondary | ICD-10-CM | POA: Insufficient documentation

## 2021-07-01 DIAGNOSIS — R197 Diarrhea, unspecified: Secondary | ICD-10-CM | POA: Diagnosis not present

## 2021-07-01 DIAGNOSIS — R112 Nausea with vomiting, unspecified: Secondary | ICD-10-CM | POA: Insufficient documentation

## 2021-07-01 DIAGNOSIS — I132 Hypertensive heart and chronic kidney disease with heart failure and with stage 5 chronic kidney disease, or end stage renal disease: Secondary | ICD-10-CM | POA: Insufficient documentation

## 2021-07-01 DIAGNOSIS — E875 Hyperkalemia: Secondary | ICD-10-CM

## 2021-07-01 DIAGNOSIS — Z7901 Long term (current) use of anticoagulants: Secondary | ICD-10-CM | POA: Diagnosis not present

## 2021-07-01 LAB — CBC WITH DIFFERENTIAL/PLATELET
Abs Immature Granulocytes: 0.06 10*3/uL (ref 0.00–0.07)
Basophils Absolute: 0 10*3/uL (ref 0.0–0.1)
Basophils Relative: 1 %
Eosinophils Absolute: 0.3 10*3/uL (ref 0.0–0.5)
Eosinophils Relative: 4 %
HCT: 28.3 % — ABNORMAL LOW (ref 39.0–52.0)
Hemoglobin: 9.7 g/dL — ABNORMAL LOW (ref 13.0–17.0)
Immature Granulocytes: 1 %
Lymphocytes Relative: 8 %
Lymphs Abs: 0.5 10*3/uL — ABNORMAL LOW (ref 0.7–4.0)
MCH: 30.7 pg (ref 26.0–34.0)
MCHC: 34.3 g/dL (ref 30.0–36.0)
MCV: 89.6 fL (ref 80.0–100.0)
Monocytes Absolute: 0.4 10*3/uL (ref 0.1–1.0)
Monocytes Relative: 7 %
Neutro Abs: 5.1 10*3/uL (ref 1.7–7.7)
Neutrophils Relative %: 79 %
Platelets: 232 10*3/uL (ref 150–400)
RBC: 3.16 MIL/uL — ABNORMAL LOW (ref 4.22–5.81)
RDW: 14.9 % (ref 11.5–15.5)
WBC: 6.4 10*3/uL (ref 4.0–10.5)
nRBC: 0 % (ref 0.0–0.2)

## 2021-07-01 LAB — HEPATITIS C ANTIBODY: HCV Ab: NONREACTIVE

## 2021-07-01 LAB — COMPREHENSIVE METABOLIC PANEL
ALT: 33 U/L (ref 0–44)
AST: 24 U/L (ref 15–41)
Albumin: 3.4 g/dL — ABNORMAL LOW (ref 3.5–5.0)
Alkaline Phosphatase: 36 U/L — ABNORMAL LOW (ref 38–126)
Anion gap: 20 — ABNORMAL HIGH (ref 5–15)
BUN: 128 mg/dL — ABNORMAL HIGH (ref 6–20)
CO2: 19 mmol/L — ABNORMAL LOW (ref 22–32)
Calcium: 8.6 mg/dL — ABNORMAL LOW (ref 8.9–10.3)
Chloride: 97 mmol/L — ABNORMAL LOW (ref 98–111)
Creatinine, Ser: 15.09 mg/dL — ABNORMAL HIGH (ref 0.61–1.24)
GFR, Estimated: 3 mL/min — ABNORMAL LOW (ref >60)
Glucose, Bld: 124 mg/dL — ABNORMAL HIGH (ref 70–99)
Potassium: 5.9 mmol/L — ABNORMAL HIGH (ref 3.5–5.1)
Sodium: 136 mmol/L (ref 135–145)
Total Bilirubin: 0.8 mg/dL (ref 0.3–1.2)
Total Protein: 6.6 g/dL (ref 6.5–8.1)

## 2021-07-01 LAB — I-STAT CHEM 8, ED
BUN: 129 mg/dL — ABNORMAL HIGH (ref 6–20)
Calcium, Ion: 0.97 mmol/L — ABNORMAL LOW (ref 1.15–1.40)
Chloride: 99 mmol/L (ref 98–111)
Creatinine, Ser: 17.5 mg/dL — ABNORMAL HIGH (ref 0.61–1.24)
Glucose, Bld: 124 mg/dL — ABNORMAL HIGH (ref 70–99)
HCT: 30 % — ABNORMAL LOW (ref 39.0–52.0)
Hemoglobin: 10.2 g/dL — ABNORMAL LOW (ref 13.0–17.0)
Potassium: 5.6 mmol/L — ABNORMAL HIGH (ref 3.5–5.1)
Sodium: 131 mmol/L — ABNORMAL LOW (ref 135–145)
TCO2: 20 mmol/L — ABNORMAL LOW (ref 22–32)

## 2021-07-01 LAB — HEPATITIS B CORE ANTIBODY, TOTAL: Hep B Core Total Ab: NONREACTIVE

## 2021-07-01 LAB — MAGNESIUM: Magnesium: 2.5 mg/dL — ABNORMAL HIGH (ref 1.7–2.4)

## 2021-07-01 LAB — HEPATITIS B SURFACE ANTIBODY,QUALITATIVE: Hep B S Ab: NONREACTIVE

## 2021-07-01 LAB — HEPATITIS B SURFACE ANTIGEN: Hepatitis B Surface Ag: NONREACTIVE

## 2021-07-01 MED ORDER — HEPARIN SODIUM (PORCINE) 1000 UNIT/ML DIALYSIS
6000.0000 [IU] | Freq: Once | INTRAMUSCULAR | Status: AC
  Administered 2021-07-01: 6000 [IU] via INTRAVENOUS_CENTRAL
  Filled 2021-07-01: qty 6

## 2021-07-01 MED ORDER — LIDOCAINE HCL (PF) 1 % IJ SOLN: 5.0000 mL | INTRAMUSCULAR | Status: DC | PRN

## 2021-07-01 MED ORDER — SODIUM ZIRCONIUM CYCLOSILICATE 10 G PO PACK: 10.0000 g | PACK | Freq: Once | ORAL | Status: DC

## 2021-07-01 MED ORDER — APIXABAN 5 MG PO TABS: 5.0000 mg | ORAL_TABLET | Freq: Two times a day (BID) | ORAL | 0 refills | Status: DC

## 2021-07-01 MED ORDER — CHLORHEXIDINE GLUCONATE CLOTH 2 % EX PADS: 6.0000 | MEDICATED_PAD | Freq: Every day | CUTANEOUS | Status: DC

## 2021-07-01 MED ORDER — LIDOCAINE-PRILOCAINE 2.5-2.5 % EX CREA
1.0000 | TOPICAL_CREAM | CUTANEOUS | Status: DC | PRN
Start: 2021-07-01 — End: 2021-07-02

## 2021-07-01 MED ORDER — PENTAFLUOROPROP-TETRAFLUOROETH EX AERO
1.0000 | INHALATION_SPRAY | CUTANEOUS | Status: DC | PRN
Start: 2021-07-01 — End: 2021-07-02

## 2021-07-01 MED ORDER — DOXERCALCIFEROL 4 MCG/2ML IV SOLN: 8.0000 ug | INTRAVENOUS | Status: DC

## 2021-07-01 NOTE — ED Notes (Signed)
Pt Unsuccessful attempt urinating

## 2021-07-01 NOTE — ED Triage Notes (Addendum)
Pt arrived POV from home stating he has diarrhea x3 days. Pt is a dialysis pt and states he missed Saturday and Thursday. Pt states he wears oxygen as needed. Pt stating 75% on RA. Pt placed on his 3L as needed and now stating 96%.

## 2021-07-01 NOTE — Progress Notes (Addendum)
Chaske Paskett is a 61 Y/O patient on hemodialysis T,Th,S at [facility]. Last hemodialysis 06/26/2021 at which he signed off early and left 8.7 kg above OP EDW. PMH: Medical noncompliance, noncompliance with HD, HTN, pulmonary HTN, anemia, SHPT.   He presented to ED with C/O diarrhea for 3 days, daily N & V. He tells me this has been ongoing since 06/25/2021. He C/O worsening SOB, wears O2 at home. He is volume overloaded with hyperkalemia as expected with new atrial fibrillation. Will order HD for volume removal/hyperkalemia today and return to ED for further work up for Afib. Will consult formally if he is admitted to hospital.    HD orders: [facility]  T,Th,S 4 hr 15 min 200NRe 400/800 76 kg 2.0 K/2.0 Ca  AVF -Heparin 6000 units IV TIW -Hectorol 8 mcg IV TIW -Mircera 75 mcg IV q 2 weeks (last dose 06/03/2021)   Juanell Fairly [facility] [place] [phone]  Patient seen by NP.  Agree with the plan above.  Will consult formally if he is admitted.  ED is planning on discharge - defer to their discretion  Claudia Desanctis, MD 4:17 PM 07/01/2021

## 2021-07-01 NOTE — Discharge Instructions (Addendum)
You had evidence of Atrial Fibrillation on your EKG today which puts you at increased risk for having clotting events. Please pick up Eliquis from your pharmacy and start taking as prescribed. Eliquis is a blood thinning medication. Please read hand out on safety concerns for this. Please call and schedule an appointment with your Cardiologist regarding this for further management. You should also schedule a follow up appointment with your PCP.

## 2021-07-01 NOTE — ED Notes (Signed)
Phlebotomy at bedside at this time.

## 2021-07-01 NOTE — ED Notes (Signed)
This RN calls HD at [facility] at this time who reports that they will call this RN back when they're able to update her on the patient's plan of care

## 2021-07-01 NOTE — ED Notes (Signed)
HD consent signed at this time in paper form. Patient verbalizes understanding of the current plan of care

## 2021-07-01 NOTE — ED Notes (Signed)
This RN spoke with dialysis nurses at this time who report that they are still waiting on the orders from the nephrologist before taking the patient to dialysis. ER provider bowie made aware

## 2021-07-01 NOTE — ED Provider Notes (Cosign Needed)
Received signout from previous provider, please see her note for complete H&P.  This is a 61 year old male significant history of end-stage renal disease currently on Tuesday Thursday Saturday dialysis who initial complaint was diarrhea for 3 days.  Patient also missed both of his sliding into his dialysis and endorse having some increased shortness of breath with exertion.  Work-up remarkable for new A-fib/flutter and patient has a CHADS2VASC score of 3 and will need anticoagulation.  Previous provider have prescribed Eliquis as well as providing cardiology outpatient referred note.  Unfortunately despite being in the ED for prolonged period of time, there is a [employer] consultant for hemodialysis at this time.  Therefore, he has not been brought up to get dialyzed yet.  We will consult medicine for admission.  10:20 PM I appreciate consultation to [facility] hospitalist and spoke with Dr. Alcario Drought who felt at this time patient will need to have hemodialysis and medicine is not agreeable to admission as there are no additional management that medicine can offer at this time.  Suspect his persistent diarrhea is due to worsening renal function.  He will need to be dialyzed.  We will continue to reach out to HD team to get patient dialyzed appropriately.  11:57 PM Pt sign out to oncoming team who will dispo pt once he finished with his dialysis session.   BP (!) 132/95 (BP Location: Left Arm)   Pulse 81   Temp (!) 97.4 F (36.3 C) (Oral)   Resp 17   Ht '6\' 2"'$  (1.88 m)   Wt 89.8 kg   SpO2 96%   BMI 25.42 kg/m   Results for orders placed or performed during the hospital encounter of 07/01/21  Comprehensive metabolic panel  Result Value Ref Range   Sodium 136 135 - 145 mmol/L   Potassium 5.9 (H) 3.5 - 5.1 mmol/L   Chloride 97 (L) 98 - 111 mmol/L   CO2 19 (L) 22 - 32 mmol/L   Glucose, Bld 124 (H) 70 - 99 mg/dL   BUN 128 (H) 6 - 20 mg/dL   Creatinine, Ser 15.09 (H) 0.61 - 1.24 mg/dL   Calcium 8.6 (L)  8.9 - 10.3 mg/dL   Total Protein 6.6 6.5 - 8.1 g/dL   Albumin 3.4 (L) 3.5 - 5.0 g/dL   AST 24 15 - 41 U/L   ALT 33 0 - 44 U/L   Alkaline Phosphatase 36 (L) 38 - 126 U/L   Total Bilirubin 0.8 0.3 - 1.2 mg/dL   GFR, Estimated 3 (L) >60 mL/min   Anion gap 20 (H) 5 - 15  CBC with Differential  Result Value Ref Range   WBC 6.4 4.0 - 10.5 K/uL   RBC 3.16 (L) 4.22 - 5.81 MIL/uL   Hemoglobin 9.7 (L) 13.0 - 17.0 g/dL   HCT 28.3 (L) 39.0 - 52.0 %   MCV 89.6 80.0 - 100.0 fL   MCH 30.7 26.0 - 34.0 pg   MCHC 34.3 30.0 - 36.0 g/dL   RDW 14.9 11.5 - 15.5 %   Platelets 232 150 - 400 K/uL   nRBC 0.0 0.0 - 0.2 %   Neutrophils Relative % 79 %   Neutro Abs 5.1 1.7 - 7.7 K/uL   Lymphocytes Relative 8 %   Lymphs Abs 0.5 (L) 0.7 - 4.0 K/uL   Monocytes Relative 7 %   Monocytes Absolute 0.4 0.1 - 1.0 K/uL   Eosinophils Relative 4 %   Eosinophils Absolute 0.3 0.0 - 0.5 K/uL  Basophils Relative 1 %   Basophils Absolute 0.0 0.0 - 0.1 K/uL   Immature Granulocytes 1 %   Abs Immature Granulocytes 0.06 0.00 - 0.07 K/uL  Magnesium  Result Value Ref Range   Magnesium 2.5 (H) 1.7 - 2.4 mg/dL  Hepatitis B surface antigen  Result Value Ref Range   Hepatitis B Surface Ag NON REACTIVE NON REACTIVE  Hepatitis B surface antibody  Result Value Ref Range   Hep B S Ab NON REACTIVE NON REACTIVE  Hepatitis B core antibody, total  Result Value Ref Range   Hep B Core Total Ab NON REACTIVE NON REACTIVE  Hepatitis C antibody  Result Value Ref Range   HCV Ab NON REACTIVE NON REACTIVE  I-stat chem 8, ED  Result Value Ref Range   Sodium 131 (L) 135 - 145 mmol/L   Potassium 5.6 (H) 3.5 - 5.1 mmol/L   Chloride 99 98 - 111 mmol/L   BUN 129 (H) 6 - 20 mg/dL   Creatinine, Ser 17.50 (H) 0.61 - 1.24 mg/dL   Glucose, Bld 124 (H) 70 - 99 mg/dL   Calcium, Ion 0.97 (L) 1.15 - 1.40 mmol/L   TCO2 20 (L) 22 - 32 mmol/L   Hemoglobin 10.2 (L) 13.0 - 17.0 g/dL   HCT 30.0 (L) 39.0 - 52.0 %   DG Chest 2 View  Result Date:  07/01/2021 CLINICAL DATA:  Shortness of breath. EXAM: CHEST - 2 VIEW COMPARISON:  05/30/2021 FINDINGS: The cardio pericardial silhouette is enlarged. Fullness in the left hilar region compatible with the mass lesion seen on CTA chest 05/22/2021. There is subtle atelectasis or infiltrate at both lung bases. No substantial pleural effusion. The visualized bony structures of the thorax are unremarkable. IMPRESSION: Enlargement cardiopericardial silhouette with subtle atelectasis or infiltrate at both lung bases. Mediastinal and left hilar mass, better characterized on chest CTA 05/22/2021. Electronically Signed   By: Misty Stanley M.D.   On: 07/01/2021 07:10       Domenic Moras, PA-C 07/01/21 2359

## 2021-07-01 NOTE — ED Provider Notes (Addendum)
[facility] EMERGENCY DEPARTMENT Provider Note   CSN: [id number] Arrival date & time: 07/01/21  3976     History PMH: HTN, CHF, ESRD on HD (S, T, Th), CHF Chief Complaint  Patient presents with   Diarrhea    Jason [name]. is a 61 y.o. male. Patient presents the ED with a chief complaint of diarrhea for 3 days breath.  He states since Saturday he has had watery diarrhea.  It is nonbloody and non-mucousy.  He states that he has diarrhea about every 2 hours.  He also states that he has had associated nausea and vomiting once a day.  He states that because he has not been feeling well he has missed dialysis on both Saturday and Tuesday.  He has been noticing worsening shortness of breath with exertion.  He states that he was recently given oxygen prescription and he wears up to 3 L at home which he does state that he has been doing.  He does state he has an upcoming pulmonology appointment.  He was also recently diagnosed with a lung mass that he needs to have worked up.  He does state that he still makes urine but very rarely.  No recent travel. No recent abx use. Denies any chest pain, abdominal pain, constipation.    Diarrhea Associated symptoms: vomiting   Associated symptoms: no abdominal pain, no chills and no fever        Home Medications Prior to Admission medications   Medication Sig Start Date End Date Taking? Authorizing Provider  amLODipine (NORVASC) 10 MG tablet Take 10 mg by mouth every morning. 06/20/18  Yes [provider]  apixaban (ELIQUIS) 5 MG TABS tablet Take 1 tablet (5 mg total) by mouth 2 (two) times daily. 07/01/21 07/31/21 Yes Suad Autrey, Adora Fridge, PA-C  carvedilol (COREG) 25 MG tablet Take 1 tablet (25 mg total) by mouth 2 (two) times daily with a meal. 02/02/18  Yes [name], FNP  hydrALAZINE (APRESOLINE) 100 MG tablet Take 100 mg by mouth 3 (three) times daily.   Yes [provider]  lanthanum (FOSRENOL)  1000 MG chewable tablet Chew 1,000 mg by mouth 2 (two) times daily with a meal.   Yes [provider]  multivitamin (RENA-VIT) TABS tablet Take 1 tablet by mouth every morning. 10/20/17  Yes [provider]  polyvinyl alcohol (LIQUIFILM TEARS) 1.4 % ophthalmic solution Place 1 drop into both eyes daily as needed for dry eyes.   Yes [provider]  OXYGEN Inhale into the lungs. 3 liters at bedtime, But as needed depending on what the patient is doing.    [provider]      Allergies    Lisinopril    Review of Systems   Review of Systems  Constitutional:  Negative for chills and fever.  Respiratory:  Positive for shortness of breath.   Cardiovascular:  Positive for leg swelling. Negative for chest pain.  Gastrointestinal:  Positive for diarrhea, nausea and vomiting. Negative for abdominal distention, abdominal pain, blood in stool and constipation.    Physical Exam Updated Vital Signs BP (!) 132/94   Pulse (!) 39   Temp 98 F (36.7 C) (Oral)   Resp 16   Ht '6\' 2"'$  (1.88 m)   Wt 89.8 kg   SpO2 (!) 84%   BMI 25.42 kg/m  Physical Exam Vitals and nursing note reviewed.  Constitutional:      General: He is not in acute  distress.    Appearance: Normal appearance. He is not ill-appearing, toxic-appearing or diaphoretic.  HENT:     Head: Normocephalic and atraumatic.     Nose: No nasal deformity.     Mouth/Throat:     Lips: Pink. No lesions.     Mouth: Mucous membranes are moist. No injury, lacerations, oral lesions or angioedema.     Pharynx: Oropharynx is clear. Uvula midline. No pharyngeal swelling, oropharyngeal exudate, posterior oropharyngeal erythema or uvula swelling.  Eyes:     General: Gaze aligned appropriately. No scleral icterus.       Right eye: No discharge.        Left eye: No discharge.     Conjunctiva/sclera: Conjunctivae normal.     Right eye: Right conjunctiva is not injected. No exudate or hemorrhage.    Left eye: Left  conjunctiva is not injected. No exudate or hemorrhage. Cardiovascular:     Rate and Rhythm: Normal rate and regular rhythm.     Pulses: Normal pulses.          Radial pulses are 2+ on the right side and 2+ on the left side.       Dorsalis pedis pulses are 2+ on the right side and 2+ on the left side.     Heart sounds: Normal heart sounds, S1 normal and S2 normal. Heart sounds not distant. No murmur heard.    No friction rub. No gallop. No S3 or S4 sounds.  Pulmonary:     Effort: Pulmonary effort is normal. No accessory muscle usage or respiratory distress.     Breath sounds: Normal breath sounds. No stridor. No wheezing, rhonchi or rales.  Chest:     Chest wall: No tenderness.  Abdominal:     General: Abdomen is flat. There is no distension.     Palpations: Abdomen is soft. There is no mass or pulsatile mass.     Tenderness: There is no abdominal tenderness. There is no right CVA tenderness, left CVA tenderness, guarding or rebound.  Musculoskeletal:     Right lower leg: Edema present.     Left lower leg: Edema present.  Skin:    General: Skin is warm and dry.     Coloration: Skin is not jaundiced or pale.     Findings: No bruising, erythema, lesion or rash.  Neurological:     General: No focal deficit present.     Mental Status: He is alert and oriented to person, place, and time.     GCS: GCS eye subscore is 4. GCS verbal subscore is 5. GCS motor subscore is 6.  Psychiatric:        Mood and Affect: Mood normal.        Behavior: Behavior normal. Behavior is cooperative.     ED Results / Procedures / Treatments   Labs (all labs ordered are listed, but only abnormal results are displayed) Labs Reviewed  COMPREHENSIVE METABOLIC PANEL - Abnormal; Notable for the following components:      Result Value   Potassium 5.9 (*)    Chloride 97 (*)    CO2 19 (*)    Glucose, Bld 124 (*)    BUN 128 (*)    Creatinine, Ser 15.09 (*)    Calcium 8.6 (*)    Albumin 3.4 (*)    Alkaline  Phosphatase 36 (*)    GFR, Estimated 3 (*)    Anion gap 20 (*)    All other components within normal limits  CBC WITH  DIFFERENTIAL/PLATELET - Abnormal; Notable for the following components:   RBC 3.16 (*)    Hemoglobin 9.7 (*)    HCT 28.3 (*)    Lymphs Abs 0.5 (*)    All other components within normal limits  MAGNESIUM - Abnormal; Notable for the following components:   Magnesium 2.5 (*)    All other components within normal limits  I-STAT CHEM 8, ED - Abnormal; Notable for the following components:   Sodium 131 (*)    Potassium 5.6 (*)    BUN 129 (*)    Creatinine, Ser 17.50 (*)    Glucose, Bld 124 (*)    Calcium, Ion 0.97 (*)    TCO2 20 (*)    Hemoglobin 10.2 (*)    HCT 30.0 (*)    All other components within normal limits  GASTROINTESTINAL PANEL BY PCR, STOOL (REPLACES STOOL CULTURE)  C DIFFICILE QUICK SCREEN W PCR REFLEX    URINALYSIS, ROUTINE W REFLEX MICROSCOPIC  HEPATITIS B SURFACE ANTIGEN  HEPATITIS B SURFACE ANTIBODY,QUALITATIVE  HEPATITIS B SURFACE ANTIBODY, QUANTITATIVE  HEPATITIS B CORE ANTIBODY, TOTAL  HEPATITIS C ANTIBODY    EKG EKG Interpretation  Date/Time:  Wednesday July 01 2021 12:01:55 EDT Ventricular Rate:  59 PR Interval:    QRS Duration: 121 QT Interval:  458 QTC Calculation: 454 R Axis:   -68 Text Interpretation: Atrial fibrillation RBBB and LAFB No significant change since last tracing Confirmed by Aletta Edouard [phone]) on 07/01/2021 12:16:10 PM  Radiology DG Chest 2 View  Result Date: 07/01/2021 CLINICAL DATA:  Shortness of breath. EXAM: CHEST - 2 VIEW COMPARISON:  05/30/2021 FINDINGS: The cardio pericardial silhouette is enlarged. Fullness in the left hilar region compatible with the mass lesion seen on CTA chest 05/22/2021. There is subtle atelectasis or infiltrate at both lung bases. No substantial pleural effusion. The visualized bony structures of the thorax are unremarkable. IMPRESSION: Enlargement cardiopericardial silhouette with  subtle atelectasis or infiltrate at both lung bases. Mediastinal and left hilar mass, better characterized on chest CTA 05/22/2021. Electronically Signed   By: Misty Stanley M.D.   On: 07/01/2021 07:10    Procedures .Critical Care  Performed by: Adolphus Birchwood, PA-C Authorized by: Adolphus Birchwood, PA-C   Critical care provider statement:    Critical care time (minutes):  45   Critical care time was exclusive of:  Separately billable procedures and treating other patients   Critical care was necessary to treat or prevent imminent or life-threatening deterioration of the following conditions:  Renal failure   Critical care was time spent personally by me on the following activities:  Blood draw for specimens, development of treatment plan with patient or surrogate, discussions with consultants, discussions with primary provider, evaluation of patient's response to treatment, examination of patient, obtaining history from patient or surrogate, review of old charts, re-evaluation of patient's condition, pulse oximetry, ordering and review of radiographic studies, ordering and review of laboratory studies and ordering and performing treatments and interventions   This patient was on telemetry or cardiac monitoring during their time in the ED.    Medications Ordered in ED Medications  Chlorhexidine Gluconate Cloth 2 % PADS 6 each (has no administration in time range)  doxercalciferol (HECTOROL) injection 8 mcg (has no administration in time range)    ED Course/ Medical Decision Making/ A&P Clinical Course as of 07/01/21 1524  Wed Jul 01, 2021  1220 Plan to dialyze per nephrology.  [GL]  [place] 3. Plan to anticoagulate. Send home  after dialysis. [GL]  1331 Discussed case with Dr. Royce Macadamia. They will plan to Dialyze him and send back to ED for discharge. [GL]  5231 61 year old male end-stage renal disease here with complaint of diarrhea.  Nontoxic-appearing.  Labs consistent with his  end-stage renal disease.  Renal will dialyze and return to ED for ultimate disposition will which will likely be discharged. [MB]    Clinical Course User Index [GL] Adolphus Birchwood, PA-C [MB] Hayden Rasmussen, MD                           Medical Decision Making Amount and/or Complexity of Data Reviewed Labs: ordered.  Risk Prescription drug management.    MDM  This is a 61 y.o. male who presents to the ED with diarrhea/shortness of breath The differential of this patient includes but is not limited to fluid overload, infectious diarrhea.  My Impression, Plan, and ED Course:  Well-appearing 61 year old male with stable vitals.  He is oxygenating at 95% on 3 L. He has missed 2 dialysis sessions in the setting of a diarrheal illness resulting in worsening shortness of breath.  No increase in his home oxygen use. He does not seem hypovolemic on exam. Appears nontoxic. Diarrhea is watery.  I personally ordered, reviewed, and interpreted all laboratory work and imaging and agree with radiologist interpretation. Results interpreted below:  EKG revealed new atrial flutter with stable rate, prolonged QT CXR reveals enlargement of cardiopericardial silhouette with subtle atelectasis or infiltrates at both lung bases. There is a mediastinal mass which was evident on recent CT.  CBC: stable anemia. No leukocytosis CMP: K 5.9, Cl 97, CO2 19 (baseline), glucose 124, Creat 15.09 (baseline) BUN 128, Ca 8.6 (corrected to normal), Albumin 3.4, AG 20 (baseline) Mag 2.5 Stool studies pending UA pending  EKG reveals new A flutter with prolonged QT. Will obtain Mag level. Recheck EKG. Repeat shows A fib now. This is a new diagnosis as far as we know.  Hyperkalemia noted on labs.  '@1331'$ , I discussed case with Dr. Royce Macadamia. They are planning on dialyzing him from ED and once he is finished, dispo is per EDP team.   At this point, I anticipate that he will be discharged following dialysis as he is  stable and has no critical findings.  Diarrhea is likely infectious and does not seem to be causing him any profound hypovolemia. I do not feel that CT imaging is indicated or that he requires any treatment for his diarrhea. He does have a new Atrial Fibrillation diagnosis. His CHADS2-VASC score is 3 in which anticoagulation is recommended for. I have sent Eliquis in to his pharmacy and he needs to follow up with Cardiology for further management.   3:24 PM Care of Vasil Juhasz. transferred to PA Rona Ravens at the end of my shift as the patient will require reassessment once labs/imaging have resulted. Patient presentation, ED course, and plan of care discussed with review of all pertinent labs and imaging. Please see his/her note for further details regarding further ED course and disposition. Plan at time of handoff is discharge following HD. This may be altered or completely changed at the discretion of the oncoming team pending results of further workup.    Charting Requirements Additional history is obtained from:  Independent historian External Records from outside source obtained and reviewed including: prior cardiology note, Ekg, labs, recent admissions Social Determinants of Health:  none Pertinant PMH that complicates  patient's illness: ESRD  Patient Care Problems that were addressed during this visit: - Diarrhea: Acute illness - Hyperkalemia: Acute illness with complication - Atrial Fibrillation: Acute illness This patient was maintained on a cardiac monitor/telemetry. I personally viewed and interpreted the cardiac monitor which reveals an underlying rhythm of A fib, rate controlled Medications given in ED: n/a Reevaluation of the patient after these medicines showed that the patient improved I have reviewed home medications and made changes accordingly. Sent in for eliquis Critical Care Interventions: HD Consultations: Nephrology Disposition: anticipate discharge  This is a  supervised visit with my attending physician, Dr. Melina Copa. We have discussed this patient and they have altered the plan as needed.  Portions of this note were generated with [employer]. Dictation errors may occur despite best attempts at proofreading.     Final Clinical Impression(s) / ED Diagnoses Final diagnoses:  Diarrhea, unspecified type  Hyperkalemia  Atrial fibrillation, unspecified type [facility])    Rx / DC Orders ED Discharge Orders          Ordered    apixaban (ELIQUIS) 5 MG TABS tablet  2 times daily        07/01/21 1307              Audrionna Lampton, Adora Fridge, PA-C 07/01/21 1515    Natally Ribera, Adora Fridge, PA-C 07/01/21 1523    Adolphus Birchwood, PA-C 07/01/21 1524    Hayden Rasmussen, MD 07/01/21 1754

## 2021-07-01 NOTE — ED Notes (Signed)
This RN spoke with the PA who acknowledges that the patient has still not been to HD. This RN has tried to call HD without success in getting an answer. PA Gertie Fey reports understanding of the situation.

## 2021-07-01 NOTE — ED Notes (Signed)
Transported to dialysis at this time by this RN

## 2021-07-01 NOTE — ED Notes (Signed)
Phlebotomy called twice at this time and once by previous RN

## 2021-07-01 NOTE — ED Notes (Signed)
Patient reminded of the need for a urine and stool sample when possible

## 2021-07-01 NOTE — ED Notes (Signed)
Dialysis called back by this RN who reports that they see the orders now and will get to the patient as soon as they can (no ETA provided) provider made aware via secure chat

## 2021-07-01 NOTE — ED Provider Triage Note (Signed)
Emergency Medicine Provider Triage Evaluation Note  Jason [name]. , a 61 y.o. male  was evaluated in triage.  Pt complains of diarrhea, reports he is having a bowel movement every 2 hours, watery diarrhea, having some associated abdominal cramping also feels swollen as he missed dialysis recently. He typically dialyzes T/Th/Sat, most recently went 06/25/21. Feels like he has fluid on him, mild dyspnea, wears oxygen PRN at home- 3L via [place]. Denies recent abx, foreign travel, or hospitalizations.   Review of Systems  Positive: Diarrhea, dyspnea, abdominal cramping Negative: Vomiting, fever  Physical Exam  BP 127/89 (BP Location: Left Arm)   Pulse (!) 50   Temp 98 F (36.7 C) (Oral)   Resp 18   Ht '6\' 2"'$  (1.88 m)   Wt 89.8 kg   SpO2 98%   BMI 25.42 kg/m  Gen:   Awake, no distress   Resp:  SpO2 in the 70s on RA, per nursing staff, applied 3L via [place] with improvement.  MSK:   Mild LE edema noted Other:  Mild abdominal distension, no peritoneal signs.   Medical Decision Making  Medically screening exam initiated at 6:26 AM.  Appropriate orders placed.  [name] was informed that the remainder of the evaluation will be completed by another provider, this initial triage assessment does not replace that evaluation, and the importance of remaining in the ED until their evaluation is complete.  Diarrhea.    Amaryllis Dyke, [place] 07/01/21 [phone]

## 2021-07-02 ENCOUNTER — Telehealth (HOSPITAL_COMMUNITY): Payer: Self-pay

## 2021-07-02 LAB — HEPATITIS B SURFACE ANTIBODY, QUANTITATIVE: Hep B S AB Quant (Post): <3.1 m[IU]/mL — ABNORMAL LOW (ref >9.9)

## 2021-07-02 NOTE — Telephone Encounter (Signed)
Reached out to patient to schedule ED f/u appointment. No answer left voicemail. 

## 2021-07-04 DIAGNOSIS — Z992 Dependence on renal dialysis: Secondary | ICD-10-CM | POA: Diagnosis not present

## 2021-07-04 DIAGNOSIS — N2581 Secondary hyperparathyroidism of renal origin: Secondary | ICD-10-CM | POA: Diagnosis not present

## 2021-07-04 DIAGNOSIS — N186 End stage renal disease: Secondary | ICD-10-CM | POA: Diagnosis not present

## 2021-07-06 ENCOUNTER — Encounter: Payer: Self-pay | Admitting: Pulmonary Disease

## 2021-07-06 ENCOUNTER — Ambulatory Visit (INDEPENDENT_AMBULATORY_CARE_PROVIDER_SITE_OTHER): Payer: Medicare HMO | Admitting: Pulmonary Disease

## 2021-07-06 VITALS — BP 160/98 | HR 74 | Temp 98.6°F | Ht 74.0 in | Wt 195.8 lb

## 2021-07-06 DIAGNOSIS — J42 Unspecified chronic bronchitis: Secondary | ICD-10-CM | POA: Diagnosis not present

## 2021-07-06 DIAGNOSIS — Z87891 Personal history of nicotine dependence: Secondary | ICD-10-CM

## 2021-07-06 DIAGNOSIS — J449 Chronic obstructive pulmonary disease, unspecified: Secondary | ICD-10-CM | POA: Diagnosis not present

## 2021-07-06 DIAGNOSIS — J9859 Other diseases of mediastinum, not elsewhere classified: Secondary | ICD-10-CM | POA: Diagnosis not present

## 2021-07-06 MED ORDER — TRELEGY ELLIPTA 100-62.5-25 MCG/ACT IN AEPB: 1.0000 | INHALATION_SPRAY | Freq: Every day | RESPIRATORY_TRACT | 0 refills | Status: DC

## 2021-07-06 MED ORDER — TRELEGY ELLIPTA 100-62.5-25 MCG/ACT IN AEPB: 1.0000 | INHALATION_SPRAY | Freq: Every day | RESPIRATORY_TRACT | 3 refills | Status: AC

## 2021-07-06 MED ORDER — ALBUTEROL SULFATE HFA 108 (90 BASE) MCG/ACT IN AERS: 2.0000 | INHALATION_SPRAY | Freq: Four times a day (QID) | RESPIRATORY_TRACT | 6 refills | Status: AC | PRN

## 2021-07-06 NOTE — H&P (View-Only) (Signed)
Synopsis: Referred in June 2023 for mediastinal mass by Almyra Deforest, PA  Subjective:   PATIENT ID: Jason [name]. GENDER: male DOB: 03-13-1960, MRN: [id number]  Chief Complaint  Patient presents with   Consult    This is a 61 year old gentleman, complicated medical history, congestive heart failure, diastolic, end-stage renal disease on dialysis, mediastinal mass.  Patient had this originally discovered in 2020.  He was evaluated at [facility].  He underwent bronchoscopy with biopsy which was negative for malignancy.  CT imaging at that time concern for mass versus esophageal duplication or bronchial cyst.  Had a repeat scan recently in the emergency department at [facility].  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  Slightly larger in size.  He is on oxygen and has been for the past several months.  He is a former smoker quit 20+ years ago.  He does have shortness of breath with exertion.  No recent pulmonary function test.    Past Medical History:  Diagnosis Date   A-V fistula (HCC)    Right arm   Abdominal hernia    AKI (acute kidney injury) ([place]) 01/2015   Anasarca    Anemia    Asthma    as a child   Bilateral inguinal hernia    Bilateral pleural effusion 01/2017   Cellulitis 01/16/2017   Bilateral lower extremity   CHF (congestive heart failure) (HCC)    Chronic venous insufficiency    Concentric left ventricular hypertrophy 08/17/2017   Moderated, noted on ECHO   Diastolic dysfunction 67/34/1937   noted on ECHO   Elevated LFTs    per patient, resolved   End stage renal disease ([place])    Dialysis T/Th/Sa  Started 02/2017/pt is waiting for a kidney transplant   H/O right inguinal hernia repair 11/2017   History of colonoscopy 03/20/2018   History of elevated PSA 10/2017   Hypertension    Pneumonia    Pulmonary hypertension ([place])    Moderate   Venous stasis ulcer ([place])    bil legs/ healed up now per pt (03/06/18)     Family History  Problem Relation Age of  Onset   Heart attack Mother 50       CABG hx   Heart disease Mother    Hypertension Mother    Stroke Mother    Diabetes Father    Healthy Brother    Prostate cancer Maternal Grandfather    Colon cancer Neg Hx    Colon polyps Neg Hx    Esophageal cancer Neg Hx    Stomach cancer Neg Hx    Rectal cancer Neg Hx      Past Surgical History:  Procedure Laterality Date   AV FISTULA PLACEMENT Right 01/20/2017   Procedure: ARTERIOVENOUS (AV) FISTULA CREATION;  Surgeon: Angelia Mould, MD;  Location: [place];  Service: Vascular;  Laterality: Right;   HERNIA REPAIR     INSERTION OF DIALYSIS CATHETER Right 01/20/2017   Procedure: INSERTION OF DIALYSIS CATHETER;  Surgeon: Angelia Mould, MD;  Location: [place];  Service: Vascular;  Laterality: Right;   INSERTION OF MESH N/A 11/18/2017   Procedure: INSERTION OF MESH;  Surgeon: Michael Boston, MD;  Location: [facility];  Service: General;  Laterality: N/A;   IR FLUORO GUIDE CV LINE RIGHT  01/18/2017   IR LUMBAR DISC ASPIRATION W/IMG GUIDE  07/21/2020   IR US GUIDE VASC ACCESS RIGHT  01/18/2017   LAPAROSCOPIC INGUINAL HERNIA WITH UMBILICAL HERNIA Bilateral 11/18/2017  Procedure: LAPAROSCOPIC BILATERAL INGUINAL HERNIA REPAIR WITH UMBILICAL HERNIA REPAIR WITH MESH;  Surgeon: Michael Boston, MD;  Location: [facility];  Service: General;  Laterality: Bilateral;   TOE SURGERY     right fracture in big toe   TONSILLECTOMY      Social History   Socioeconomic History   Marital status: Single    Spouse name: Not on file   Number of children: Not on file   Years of education: Not on file   Highest education level: Not on file  Occupational History   Not on file  Tobacco Use   Smoking status: Former    Years: 17.00    Types: Cigarettes    Quit date: 2    Years since quitting: 26.4   Smokeless tobacco: Never   Tobacco comments:    quit smoking in 1997  Vaping Use   Vaping Use: Never used  Substance and Sexual Activity   Alcohol use: Not  Currently    Comment: every several months, rare   Drug use: Not Currently    Comment: marijuana   Sexual activity: Not Currently  Other Topics Concern   Not on file  Social History Narrative   Not on file   Social Determinants of Health   Financial Resource Strain: Not on file  Food Insecurity: Not on file  Transportation Needs: No Transportation Needs (09/11/2020)   PRAPARE - Hydrologist (Medical): No    Lack of Transportation (Non-Medical): No  Physical Activity: Not on file  Stress: Not on file  Social Connections: Not on file  Intimate Partner Violence: Not on file     Allergies  Allergen Reactions   Lisinopril Cough     Outpatient Medications Prior to Visit  Medication Sig Dispense Refill   amLODipine (NORVASC) 10 MG tablet Take 10 mg by mouth every morning.     apixaban (ELIQUIS) 5 MG TABS tablet Take 1 tablet (5 mg total) by mouth 2 (two) times daily. 60 tablet 0   carvedilol (COREG) 25 MG tablet Take 1 tablet (25 mg total) by mouth 2 (two) times daily with a meal. 60 tablet 2   hydrALAZINE (APRESOLINE) 100 MG tablet Take 100 mg by mouth 3 (three) times daily.     lanthanum (FOSRENOL) 1000 MG chewable tablet Chew 1,000 mg by mouth 2 (two) times daily with a meal.     multivitamin (RENA-VIT) TABS tablet Take 1 tablet by mouth every morning.  11   OXYGEN Inhale into the lungs. 3 liters at bedtime, But as needed depending on what the patient is doing.     polyvinyl alcohol (LIQUIFILM TEARS) 1.4 % ophthalmic solution Place 1 drop into both eyes daily as needed for dry eyes.     No facility-administered medications prior to visit.    Review of Systems  Constitutional:  Negative for chills, fever, malaise/fatigue and weight loss.  HENT:  Negative for hearing loss, sore throat and tinnitus.   Eyes:  Negative for blurred vision and double vision.  Respiratory:  Positive for shortness of breath. Negative for cough, hemoptysis, sputum  production, wheezing and stridor.   Cardiovascular:  Negative for chest pain, palpitations, orthopnea, leg swelling and PND.  Gastrointestinal:  Negative for abdominal pain, constipation, diarrhea, heartburn, nausea and vomiting.  Genitourinary:  Negative for dysuria, hematuria and urgency.  Musculoskeletal:  Negative for joint pain and myalgias.  Skin:  Negative for itching and rash.  Neurological:  Negative for dizziness, tingling, weakness  and headaches.  Endo/Heme/Allergies:  Negative for environmental allergies. Does not bruise/bleed easily.  Psychiatric/Behavioral:  Negative for depression. The patient is not nervous/anxious and does not have insomnia.   All other systems reviewed and are negative.    Objective:  Physical Exam Vitals reviewed.  Constitutional:      General: He is not in acute distress.    Appearance: He is well-developed.  HENT:     Head: Normocephalic and atraumatic.  Eyes:     General: No scleral icterus.    Conjunctiva/sclera: Conjunctivae normal.     Pupils: Pupils are equal, round, and reactive to light.  Neck:     Vascular: No JVD.     Trachea: No tracheal deviation.  Cardiovascular:     Rate and Rhythm: Normal rate and regular rhythm.     Heart sounds: Normal heart sounds. No murmur heard. Pulmonary:     Effort: Pulmonary effort is normal. No tachypnea, accessory muscle usage or respiratory distress.     Breath sounds: No stridor. No wheezing, rhonchi or rales.  Abdominal:     General: There is no distension.     Palpations: Abdomen is soft.     Tenderness: There is no abdominal tenderness.  Musculoskeletal:        General: No tenderness.     Cervical back: Neck supple.     Comments: Right-sided upper extremity AV fistula palpable thrill  Lymphadenopathy:     Cervical: No cervical adenopathy.  Skin:    General: Skin is warm and dry.     Capillary Refill: Capillary refill takes less than 2 seconds.     Findings: No rash.  Neurological:      Mental Status: He is alert and oriented to person, place, and time.  Psychiatric:        Behavior: Behavior normal.      Vitals:   07/06/21 1603  BP: (!) 160/98  Pulse: 74  Temp: 98.6 F (37 C)  TempSrc: Oral  SpO2: 97%  Weight: 195 lb 12.8 oz (88.8 kg)  Height: '6\' 2"'$  (1.88 m)   97% on RA BMI Readings from Last 3 Encounters:  07/06/21 25.14 kg/m  07/01/21 25.42 kg/m  05/30/21 24.77 kg/m   Wt Readings from Last 3 Encounters:  07/06/21 195 lb 12.8 oz (88.8 kg)  07/01/21 198 lb (89.8 kg)  05/30/21 192 lb 14.4 oz (87.5 kg)     CBC    Component Value Date/Time   WBC 6.4 07/01/2021 0643   RBC 3.16 (L) 07/01/2021 0643   HGB 10.2 (L) 07/01/2021 0713   HGB 9.4 (L) 03/19/2019 1429   HCT 30.0 (L) 07/01/2021 0713   HCT 29.6 (L) 03/19/2019 1429   PLT 232 07/01/2021 0643   PLT 227 03/19/2019 1429   MCV 89.6 07/01/2021 0643   MCV 90 03/19/2019 1429   MCH 30.7 07/01/2021 0643   MCHC 34.3 07/01/2021 0643   RDW 14.9 07/01/2021 0643   RDW 12.5 03/19/2019 1429   LYMPHSABS 0.5 (L) 07/01/2021 0643   LYMPHSABS 0.6 (L) 03/19/2019 1429   MONOABS 0.4 07/01/2021 0643   EOSABS 0.3 07/01/2021 0643   EOSABS 0.6 (H) 03/19/2019 1429   BASOSABS 0.0 07/01/2021 0643   BASOSABS 0.1 03/19/2019 1429     Chest Imaging:  05/22/2021: CT chest: Mediastinal mass, ovoid shaped cystic lesion. The patient's images have been independently reviewed by me.     Pulmonary Functions Testing Results:     No data to display  FeNO:   Pathology:   Echocardiogram:   Heart Catheterization:     Assessment & Plan:     ICD-10-CM   1. Chronic bronchitis, unspecified chronic bronchitis type [facility])  [place] [facility] referral to Cardiothoracic Surgery    2. Former smoker  Z87.891 Pulmonary Function Test    3. Mediastinal mass  J98.59 Ambulatory referral to Cardiothoracic Surgery    NM PET Image Initial (PI) Skull Base To Thigh (F-18 FDG)    Pulmonary Function Test    4. Stage 2  moderate COPD by GOLD classification ([place])  J44.9       Discussion:  This is a 61 year old gentleman referred for evaluation of mediastinal mass.  He has a ovoid shaped lesion within the central mediastinum adjacent to the bronchus and esophagus.  He has underwent tissue sampling of this lesion in 2020 completed at [facility] which was negative for malignancy.  Plan: I think we are dealing with an esophageal or bronchial cyst. We will get a PET scan for further characterization because I do not see this has been completed before. Pulmonary function test be completed prior to evaluation by thoracic surgery. He does have several medical comorbidities but is in good physical shape. He may be a candidate for surgery and if not we can continue to observe this lesion over time. If is not bothering him it may be best just left alone.  Additional time spent reviewing chart from [facility] as well as images.   Current Outpatient Medications:    albuterol (VENTOLIN HFA) 108 (90 Base) MCG/ACT inhaler, Inhale 2 puffs into the lungs every 6 (six) hours as needed for wheezing or shortness of breath., Disp: 8 g, Rfl: 6   amLODipine (NORVASC) 10 MG tablet, Take 10 mg by mouth every morning., Disp: , Rfl:    apixaban (ELIQUIS) 5 MG TABS tablet, Take 1 tablet (5 mg total) by mouth 2 (two) times daily., Disp: 60 tablet, Rfl: 0   carvedilol (COREG) 25 MG tablet, Take 1 tablet (25 mg total) by mouth 2 (two) times daily with a meal., Disp: 60 tablet, Rfl: 2   Fluticasone-Umeclidin-Vilant (TRELEGY ELLIPTA) 100-62.5-25 MCG/ACT AEPB, Inhale 1 puff into the lungs daily., Disp: 3 each, Rfl: 3   hydrALAZINE (APRESOLINE) 100 MG tablet, Take 100 mg by mouth 3 (three) times daily., Disp: , Rfl:    lanthanum (FOSRENOL) 1000 MG chewable tablet, Chew 1,000 mg by mouth 2 (two) times daily with a meal., Disp: , Rfl:    multivitamin (RENA-VIT) TABS tablet, Take 1 tablet by mouth every morning., Disp: , Rfl: 11   OXYGEN,  Inhale into the lungs. 3 liters at bedtime, But as needed depending on what the patient is doing., Disp: , Rfl:    polyvinyl alcohol (LIQUIFILM TEARS) 1.4 % ophthalmic solution, Place 1 drop into both eyes daily as needed for dry eyes., Disp: , Rfl:   I spent 63 minutes dedicated to the care of this patient on the date of this encounter to include pre-visit review of records, face-to-face time with the patient discussing conditions above, post visit ordering of testing, clinical documentation with the electronic health record, making appropriate referrals as documented, and communicating necessary findings to members of the patients care team.    Garner Nash, DO [facility] 07/06/2021 4:38 PM

## 2021-07-07 DIAGNOSIS — N2581 Secondary hyperparathyroidism of renal origin: Secondary | ICD-10-CM | POA: Diagnosis not present

## 2021-07-07 DIAGNOSIS — N186 End stage renal disease: Secondary | ICD-10-CM | POA: Diagnosis not present

## 2021-07-07 DIAGNOSIS — Z992 Dependence on renal dialysis: Secondary | ICD-10-CM | POA: Diagnosis not present

## 2021-07-08 ENCOUNTER — Ambulatory Visit (HOSPITAL_COMMUNITY): Payer: Medicare HMO | Admitting: Nurse Practitioner

## 2021-07-09 DIAGNOSIS — N186 End stage renal disease: Secondary | ICD-10-CM | POA: Diagnosis not present

## 2021-07-09 DIAGNOSIS — N2581 Secondary hyperparathyroidism of renal origin: Secondary | ICD-10-CM | POA: Diagnosis not present

## 2021-07-09 DIAGNOSIS — Z992 Dependence on renal dialysis: Secondary | ICD-10-CM | POA: Diagnosis not present

## 2021-07-10 DIAGNOSIS — Z992 Dependence on renal dialysis: Secondary | ICD-10-CM | POA: Diagnosis not present

## 2021-07-10 DIAGNOSIS — N186 End stage renal disease: Secondary | ICD-10-CM | POA: Diagnosis not present

## 2021-07-10 DIAGNOSIS — I129 Hypertensive chronic kidney disease with stage 1 through stage 4 chronic kidney disease, or unspecified chronic kidney disease: Secondary | ICD-10-CM | POA: Diagnosis not present

## 2021-07-11 DIAGNOSIS — Z992 Dependence on renal dialysis: Secondary | ICD-10-CM | POA: Diagnosis not present

## 2021-07-11 DIAGNOSIS — N186 End stage renal disease: Secondary | ICD-10-CM | POA: Diagnosis not present

## 2021-07-11 DIAGNOSIS — I129 Hypertensive chronic kidney disease with stage 1 through stage 4 chronic kidney disease, or unspecified chronic kidney disease: Secondary | ICD-10-CM | POA: Diagnosis not present

## 2021-07-13 ENCOUNTER — Encounter (HOSPITAL_COMMUNITY): Admission: RE | Admit: 2021-07-13 | Payer: Medicare HMO | Source: Ambulatory Visit

## 2021-07-15 DIAGNOSIS — N186 End stage renal disease: Secondary | ICD-10-CM | POA: Diagnosis not present

## 2021-07-15 DIAGNOSIS — N2581 Secondary hyperparathyroidism of renal origin: Secondary | ICD-10-CM | POA: Diagnosis not present

## 2021-07-15 DIAGNOSIS — Z992 Dependence on renal dialysis: Secondary | ICD-10-CM | POA: Diagnosis not present

## 2021-07-16 DIAGNOSIS — N2581 Secondary hyperparathyroidism of renal origin: Secondary | ICD-10-CM | POA: Diagnosis not present

## 2021-07-16 DIAGNOSIS — N186 End stage renal disease: Secondary | ICD-10-CM | POA: Diagnosis not present

## 2021-07-16 DIAGNOSIS — Z992 Dependence on renal dialysis: Secondary | ICD-10-CM | POA: Diagnosis not present

## 2021-07-18 DIAGNOSIS — N2581 Secondary hyperparathyroidism of renal origin: Secondary | ICD-10-CM | POA: Diagnosis not present

## 2021-07-18 DIAGNOSIS — N186 End stage renal disease: Secondary | ICD-10-CM | POA: Diagnosis not present

## 2021-07-18 DIAGNOSIS — Z992 Dependence on renal dialysis: Secondary | ICD-10-CM | POA: Diagnosis not present

## 2021-07-21 DIAGNOSIS — Z992 Dependence on renal dialysis: Secondary | ICD-10-CM | POA: Diagnosis not present

## 2021-07-21 DIAGNOSIS — N2581 Secondary hyperparathyroidism of renal origin: Secondary | ICD-10-CM | POA: Diagnosis not present

## 2021-07-21 DIAGNOSIS — N186 End stage renal disease: Secondary | ICD-10-CM | POA: Diagnosis not present

## 2021-07-22 ENCOUNTER — Telehealth: Payer: Self-pay | Admitting: Cardiology

## 2021-07-22 ENCOUNTER — Encounter (HOSPITAL_COMMUNITY)
Admission: RE | Admit: 2021-07-22 | Discharge: 2021-07-22 | Disposition: A | Discharge status: Home / Self Care | Payer: Medicare HMO | Source: Ambulatory Visit | Attending: Pulmonary Disease | Admitting: Pulmonary Disease

## 2021-07-22 DIAGNOSIS — J432 Centrilobular emphysema: Secondary | ICD-10-CM | POA: Insufficient documentation

## 2021-07-22 DIAGNOSIS — K5730 Diverticulosis of large intestine without perforation or abscess without bleeding: Secondary | ICD-10-CM | POA: Diagnosis not present

## 2021-07-22 DIAGNOSIS — G319 Degenerative disease of nervous system, unspecified: Secondary | ICD-10-CM | POA: Diagnosis not present

## 2021-07-22 DIAGNOSIS — R911 Solitary pulmonary nodule: Secondary | ICD-10-CM | POA: Diagnosis not present

## 2021-07-22 DIAGNOSIS — J438 Other emphysema: Secondary | ICD-10-CM | POA: Diagnosis not present

## 2021-07-22 DIAGNOSIS — I700 Atherosclerosis of aorta: Secondary | ICD-10-CM | POA: Insufficient documentation

## 2021-07-22 DIAGNOSIS — I2510 Atherosclerotic heart disease of native coronary artery without angina pectoris: Secondary | ICD-10-CM | POA: Diagnosis not present

## 2021-07-22 DIAGNOSIS — R918 Other nonspecific abnormal finding of lung field: Secondary | ICD-10-CM | POA: Insufficient documentation

## 2021-07-22 DIAGNOSIS — J9859 Other diseases of mediastinum, not elsewhere classified: Secondary | ICD-10-CM | POA: Insufficient documentation

## 2021-07-22 LAB — GLUCOSE, CAPILLARY: Glucose-Capillary: 99 mg/dL (ref 70–99)

## 2021-07-22 MED ORDER — CARVEDILOL 6.25 MG PO TABS: 6.2500 mg | ORAL_TABLET | Freq: Two times a day (BID) | ORAL | 3 refills | Status: AC

## 2021-07-22 MED ORDER — FLUDEOXYGLUCOSE F - 18 (FDG) INJECTION
10.0000 | Freq: Once | INTRAVENOUS | Status: AC | PRN
  Administered 2021-07-22: 10 via INTRAVENOUS

## 2021-07-22 NOTE — Telephone Encounter (Signed)
Lelon Perla, MD  Cristopher Estimable, RN 40 minutes ago (3:57 PM)    DC amlodipine and decrease coreg to 6.25 BID; follow BP  Kirk Ruths    Returned call to patient and made him aware of Dr. Jacalyn Lefevre recommendations. Patient aware and verbalized understanding of all instructions. Amlodipine D/C'd, and Coreg 6.'25mg'$  BID sent to patient preferred pharmacy.   Advised patient to call back to office with any issues, questions, or concerns. Patient verbalized understanding.

## 2021-07-22 NOTE — Telephone Encounter (Signed)
Pt c/o BP issue: STAT if pt c/o blurred vision, one-sided weakness or slurred speech  1. What are your last 5 BP readings? 80/60 when on dialysis 115/70 when not on   2. Are you having any other symptoms (ex. Dizziness, headache, blurred vision, passed out)? Dizzy   3. What is your BP issue? Patient said his BP was low yesterday when he was at dialysis   STAT if patient feels like he/she is going to faint   Are you dizzy now? no  Do you feel faint or have you passed out? Patient has not passed out. He is afraid to stand up for fear of passing out   Do you have any other symptoms? no  Have you checked your HR and BP (record if available)? Patient gets it checked when he is at dialysis

## 2021-07-22 NOTE — Telephone Encounter (Signed)
Spoke with patient of Dr. [name]. He reports low BP on dialysis days. He typically does not take amlodipine and carvedilol prior to dialysis. He reports low readings even if he misses a dose the day before dialysis. On non-dialysis days, BP is 110s/70s. He has had BPs in 90s/70s prior to dialysis. Will send to MD to review if meds need to be adjusted

## 2021-07-23 DIAGNOSIS — N2581 Secondary hyperparathyroidism of renal origin: Secondary | ICD-10-CM | POA: Diagnosis not present

## 2021-07-23 DIAGNOSIS — Z992 Dependence on renal dialysis: Secondary | ICD-10-CM | POA: Diagnosis not present

## 2021-07-23 DIAGNOSIS — N186 End stage renal disease: Secondary | ICD-10-CM | POA: Diagnosis not present

## 2021-07-23 NOTE — Progress Notes (Signed)
[address]       [place] [address]             [place] Jr[place] Record #[id number] Date of Birth: [birth year]  Referring: Garner Nash, DO Primary Care: [name], FNP Primary Cardiologist: Kirk Ruths, MD  Chief Complaint:    Chief Complaint  Patient presents with   esophageal bronchial cyst    Surgical consult/ PET Scan 07/22/21/ PFT's 07/06/21/ CTA Chest 05/22/21    History of Present Illness:    Jason [name]. 61 y.o. male presents for surgical evaluation of a mediastinal mass.  He did undergo a PET/CT which identified an avid pulmonary nodule along with due to the in his thoracic spine concerning for metastatic disease.  He does admit to shortness of breath with minimal activity.  In regards to the mediastinal cyst, this was previously evaluated at [facility] at which point he had a biopsy which was negative.  He is a former smoker who quit over 20 years ago.      Zubrod Score: At the time of surgery this patient's most appropriate activity status/level should be described as: '[]'$     0    Normal activity, no symptoms '[x]'$     1    Restricted in physical strenuous activity but ambulatory, able to do out light work '[]'$     2    Ambulatory and capable of self care, unable to do work activities, up and about               >50 % of waking hours                              '[]'$     3    Only limited self care, in bed greater than 50% of waking hours '[]'$     4    Completely disabled, no self care, confined to bed or chair '[]'$     5    Moribund   Past Medical History:  Diagnosis Date   A-V fistula ([place])    Right arm   Abdominal hernia    AKI (acute kidney injury) ([place]) 01/2015   Anasarca    Anemia    Asthma    as a child   Bilateral inguinal hernia    Bilateral pleural effusion 01/2017   Cellulitis 01/16/2017   Bilateral lower extremity   CHF (congestive heart failure) ([place])     Chronic venous insufficiency    Concentric left ventricular hypertrophy 08/17/2017   Moderated, noted on ECHO   Diastolic dysfunction 19/14/7829   noted on ECHO   Elevated LFTs    per patient, resolved   End stage renal disease ([place])    Dialysis T/Th/Sa  Started 02/2017/pt is waiting for a kidney transplant   H/O right inguinal hernia repair 11/2017   History of colonoscopy 03/20/2018   History of elevated PSA 10/2017   Hypertension    Pneumonia    Pulmonary hypertension ([place])    Moderate   Venous stasis ulcer ([place])    bil legs/ healed up now per pt (03/06/18)    Past Surgical History:  Procedure Laterality Date   AV FISTULA PLACEMENT Right 01/20/2017   Procedure: ARTERIOVENOUS (AV) FISTULA CREATION;  Surgeon: Angelia Mould,  MD;  Location: [facility];  Service: Vascular;  Laterality: Right;   HERNIA REPAIR     INSERTION OF DIALYSIS CATHETER Right 01/20/2017   Procedure: INSERTION OF DIALYSIS CATHETER;  Surgeon: Angelia Mould, MD;  Location: [facility];  Service: Vascular;  Laterality: Right;   INSERTION OF MESH N/A 11/18/2017   Procedure: INSERTION OF MESH;  Surgeon: Michael Boston, MD;  Location: [facility];  Service: General;  Laterality: N/A;   IR FLUORO GUIDE CV LINE RIGHT  01/18/2017   IR LUMBAR DISC ASPIRATION W/IMG GUIDE  07/21/2020   IR US GUIDE VASC ACCESS RIGHT  01/18/2017   LAPAROSCOPIC INGUINAL HERNIA WITH UMBILICAL HERNIA Bilateral 11/18/2017   Procedure: LAPAROSCOPIC BILATERAL INGUINAL HERNIA REPAIR WITH UMBILICAL HERNIA REPAIR WITH MESH;  Surgeon: Michael Boston, MD;  Location: [facility];  Service: General;  Laterality: Bilateral;   TOE SURGERY     right fracture in big toe   TONSILLECTOMY      Family History  Problem Relation Age of Onset   Heart attack Mother 55       CABG hx   Heart disease Mother    Hypertension Mother    Stroke Mother    Diabetes Father    Healthy Brother    Prostate cancer Maternal Grandfather    Colon cancer Neg Hx    Colon polyps Neg Hx     Esophageal cancer Neg Hx    Stomach cancer Neg Hx    Rectal cancer Neg Hx      Social History   Tobacco Use  Smoking Status Former   Years: 17.00   Types: Cigarettes   Quit date: 1997   Years since quitting: 26.5  Smokeless Tobacco Never  Tobacco Comments   quit smoking in 1997    Social History   Substance and Sexual Activity  Alcohol Use Not Currently   Comment: every several months, rare     Allergies  Allergen Reactions   Lisinopril Cough    Current Outpatient Medications  Medication Sig Dispense Refill   albuterol (VENTOLIN HFA) 108 (90 Base) MCG/ACT inhaler Inhale 2 puffs into the lungs every 6 (six) hours as needed for wheezing or shortness of breath. 8 g 6   apixaban (ELIQUIS) 5 MG TABS tablet Take 1 tablet (5 mg total) by mouth 2 (two) times daily. 60 tablet 0   carvedilol (COREG) 6.25 MG tablet Take 1 tablet (6.25 mg total) by mouth 2 (two) times daily. 60 tablet 3   Fluticasone-Umeclidin-Vilant (TRELEGY ELLIPTA) 100-62.5-25 MCG/ACT AEPB Inhale 1 puff into the lungs daily. 3 each 3   Fluticasone-Umeclidin-Vilant (TRELEGY ELLIPTA) 100-62.5-25 MCG/ACT AEPB Inhale 1 puff into the lungs daily. 60 each 0   lanthanum (FOSRENOL) 1000 MG chewable tablet Chew 1,000 mg by mouth 2 (two) times daily with a meal.     multivitamin (RENA-VIT) TABS tablet Take 1 tablet by mouth every morning.  11   OXYGEN Inhale into the lungs. 3 liters at bedtime, But as needed depending on what the patient is doing.     hydrALAZINE (APRESOLINE) 100 MG tablet Take 100 mg by mouth 3 (three) times daily.     polyvinyl alcohol (LIQUIFILM TEARS) 1.4 % ophthalmic solution Place 1 drop into both eyes daily as needed for dry eyes.     No current facility-administered medications for this visit.    Review of Systems  Respiratory:  Positive for shortness of breath. Negative for cough.   Cardiovascular:  Negative for chest pain.  Musculoskeletal:  Positive for back pain and myalgias.      PHYSICAL EXAMINATION: BP (!) 141/85 (BP Location: Left Arm, Patient Position: Sitting)   Pulse 90   Resp 18   Ht '6\' 2"'$  (1.88 m)   Wt 170 lb (77.1 kg)   SpO2 90% Comment: RA  BMI 21.83 kg/m  Physical Exam Constitutional:      General: He is not in acute distress.    Appearance: Normal appearance. He is normal weight. He is not ill-appearing.  HENT:     Head: Normocephalic and atraumatic.     Nose: Nose normal.  Eyes:     Extraocular Movements: Extraocular movements intact.  Cardiovascular:     Rate and Rhythm: Normal rate.  Pulmonary:     Effort: Pulmonary effort is normal. No respiratory distress.  Abdominal:     General: Abdomen is flat. There is no distension.  Musculoskeletal:        General: Normal range of motion.  Skin:    General: Skin is warm and dry.  Neurological:     General: No focal deficit present.     Mental Status: He is alert and oriented to person, place, and time.     Diagnostic Studies & Laboratory data:     Recent Radiology Findings:   NM PET Image Initial (PI) Skull Base To Thigh (F-18 FDG)  Result Date: 07/23/2021 CLINICAL DATA:  Initial treatment strategy for mediastinal mass. EXAM: NUCLEAR MEDICINE PET SKULL BASE TO THIGH TECHNIQUE: 8.6 mCi F-18 FDG was injected intravenously. Full-ring PET imaging was performed from the skull base to thigh after the radiotracer. CT data was obtained and used for attenuation correction and anatomic localization. Fasting blood glucose: 99 mg/dl COMPARISON:  Chest CTA 05/22/2021.  Neck CTA 11/01/2019. FINDINGS: Mediastinal blood pool activity: SUV max 2.9 NECK: Imaging through the head and neck is limited by motion artifact, resulting in misregistration between the PET and CT data. Allowing for this, no hypermetabolic cervical lymph nodes or definite lesions of the pharyngeal mucosal space are identified.Pharyngeal assessment is limited. Incidental CT findings: Bilateral carotid atherosclerosis. CHEST: The  spiculated left apical nodule is intensely hypermetabolic with an SUV max of 11.4. This nodule measures 2.5 x 1.6 cm on image 15/7. There are small hypermetabolic left hilar lymph nodes (SUV max 7.8). The dominant mediastinal mass seen on CT is not hypermetabolic, devoid of central metabolic activity. This lesion measures 8.6 x 5.5 cm and has a density of 56 HU. No hypermetabolic contralateral mediastinal or hilar lymph nodes. No other hypermetabolic pulmonary activity or suspicious nodularity. Incidental CT findings: Centrilobular and paraseptal emphysema. Apparent chronic mucous plugging in the left upper lobe bronchus without central hypermetabolic activity. Moderate cardiac enlargement with diffuse atherosclerosis of the aorta, great vessels and coronary arteries. ABDOMEN/PELVIS: There is no hypermetabolic activity within the liver, adrenal glands, spleen or pancreas. There is no hypermetabolic nodal activity. Diffuse bowel activity within physiologic limits. Incidental CT findings: Bilateral renal cortical thinning and atrophy consistent with chronic medical renal disease. A low-density 1.7 cm lesion projecting posteriorly from the mid left kidney demonstrates no metabolic activity, consistent with an incidental cyst. Diffuse aortic and branch vessel atherosclerosis. Mild distal colonic diverticulosis. SKELETON: Focal hypermetabolic activity within the T6 vertebral body (SUV max 6.9), suspicious for possible metastatic disease. No other suspicious osseous activity. Incidental CT findings: Multilevel spondylosis. Dilated, peripherally calcified hemodialysis graft noted in the right upper arm. Soft tissue calcification posterior to the right shoulder with associated hypermetabolic activity, most consistent with  hydroxyapatite deposition. There is also some periarticular soft tissue activity around the trochanteric regions of both hips. IMPRESSION: 1. Spiculated, hypermetabolic left upper lobe pulmonary nodule  with hypermetabolic left hilar lymph nodes, highly suspicious for bronchogenic carcinoma. 2. 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 3. Focal hypermetabolic activity in the midthoracic spine suspicious for metastatic disease. No other evidence of distant metastases. 4. Evaluation of the head and neck limited by motion artifact. 5. Coronary and aortic atherosclerosis (ICD10-I70.0). Emphysema (ICD10-J43.9). Electronically Signed   By: Richardean Sale M.D.   On: 07/23/2021 13:49   DG Chest 2 View  Result Date: 07/01/2021 CLINICAL DATA:  Shortness of breath. EXAM: CHEST - 2 VIEW COMPARISON:  05/30/2021 FINDINGS: The cardio pericardial silhouette is enlarged. Fullness in the left hilar region compatible with the mass lesion seen on CTA chest 05/22/2021. There is subtle atelectasis or infiltrate at both lung bases. No substantial pleural effusion. The visualized bony structures of the thorax are unremarkable. IMPRESSION: Enlargement cardiopericardial silhouette with subtle atelectasis or infiltrate at both lung bases. Mediastinal and left hilar mass, better characterized on chest CTA 05/22/2021. Electronically Signed   By: Misty Stanley M.D.   On: 07/01/2021 07:10       I have independently reviewed the above radiology studies  and reviewed the findings with the patient.   Recent Lab Findings: Lab Results  Component Value Date   WBC 6.4 07/01/2021   HGB 10.2 (L) 07/01/2021   HCT 30.0 (L) 07/01/2021   PLT 232 07/01/2021   GLUCOSE 124 (H) 07/01/2021   CHOL 123 03/19/2019   TRIG 61 03/19/2019   HDL 49 03/19/2019   LDLCALC 61 03/19/2019   ALT 33 07/01/2021   AST 24 07/01/2021   NA 131 (L) 07/01/2021   K 5.6 (H) 07/01/2021   CL 99 07/01/2021   CREATININE 17.50 (H) 07/01/2021   BUN 129 (H) 07/01/2021   CO2 19 (L) 07/01/2021   TSH 1.901 08/03/2020   INR 1.2 07/21/2020    HGBA1C 4.9 01/17/2017             Assessment / Plan:   61 year old male with a left upper lobe avid pulmonary nodule, hypermetabolic hilar adenopathy, and a spinal lesion with increased uptake as well.  All this is concerning for stage IV disease.  He will require a tissue diagnosis.  This has been discussed with Dr. Valeta Harms, and plans for biopsy.  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  This will be secondary to obtaining a tissue sample of the pulmonary nodule, and nodal avidity.    I  spent 20 minutes with  the patient face to face in counseling and coordination of care.    Jaielle Dlouhy O Khaliah Barnick 07/24/2021 1:20 PM

## 2021-07-24 ENCOUNTER — Institutional Professional Consult (permissible substitution) (INDEPENDENT_AMBULATORY_CARE_PROVIDER_SITE_OTHER): Payer: Medicare HMO | Admitting: Thoracic Surgery (Cardiothoracic Vascular Surgery)

## 2021-07-24 VITALS — BP 141/85 | HR 90 | Resp 18 | Ht 74.0 in | Wt 170.0 lb

## 2021-07-24 DIAGNOSIS — K2289 Other specified disease of esophagus: Secondary | ICD-10-CM | POA: Diagnosis not present

## 2021-07-28 ENCOUNTER — Telehealth: Payer: Self-pay | Admitting: Pulmonary Disease

## 2021-07-28 DIAGNOSIS — R918 Other nonspecific abnormal finding of lung field: Secondary | ICD-10-CM | POA: Insufficient documentation

## 2021-07-28 NOTE — Telephone Encounter (Signed)
PCCM:  I called and left a message for the patient to return phone call to our office [phone].  We need to set him up for bronchoscopy and biopsy.  This was discussed with him at his office appointment with Dr. Kipp Brood on Friday.  I saw the patient in the office a few weeks ago.  If the patient is willing to have procedure done on Monday I have availability to do the procedure then. I have left a voicemail for him to call back.  If he does I be happy to speak with him on the phone.  Or we can go ahead and get his procedure set up for Monday 08/03/2021  [facility] 07/28/2021 3:16 PM

## 2021-07-29 ENCOUNTER — Encounter: Payer: Self-pay | Admitting: Pulmonary Disease

## 2021-07-29 ENCOUNTER — Emergency Department (HOSPITAL_COMMUNITY)
Admission: EM | Admit: 2021-07-29 | Discharge: 2021-07-30 | Disposition: A | Discharge status: Home / Self Care | Payer: Medicare HMO | Attending: Emergency Medicine | Admitting: Emergency Medicine

## 2021-07-29 ENCOUNTER — Encounter (HOSPITAL_COMMUNITY): Payer: Self-pay | Admitting: Emergency Medicine

## 2021-07-29 ENCOUNTER — Emergency Department (HOSPITAL_COMMUNITY): Payer: Medicare HMO

## 2021-07-29 ENCOUNTER — Other Ambulatory Visit: Payer: Self-pay

## 2021-07-29 DIAGNOSIS — R0600 Dyspnea, unspecified: Secondary | ICD-10-CM | POA: Insufficient documentation

## 2021-07-29 DIAGNOSIS — E8779 Other fluid overload: Secondary | ICD-10-CM | POA: Diagnosis not present

## 2021-07-29 DIAGNOSIS — R5383 Other fatigue: Secondary | ICD-10-CM | POA: Diagnosis not present

## 2021-07-29 DIAGNOSIS — Z7901 Long term (current) use of anticoagulants: Secondary | ICD-10-CM | POA: Insufficient documentation

## 2021-07-29 DIAGNOSIS — N186 End stage renal disease: Secondary | ICD-10-CM | POA: Diagnosis not present

## 2021-07-29 DIAGNOSIS — R112 Nausea with vomiting, unspecified: Secondary | ICD-10-CM | POA: Diagnosis not present

## 2021-07-29 DIAGNOSIS — N19 Unspecified kidney failure: Secondary | ICD-10-CM

## 2021-07-29 DIAGNOSIS — E8770 Fluid overload, unspecified: Secondary | ICD-10-CM | POA: Diagnosis not present

## 2021-07-29 DIAGNOSIS — R0602 Shortness of breath: Secondary | ICD-10-CM | POA: Diagnosis not present

## 2021-07-29 DIAGNOSIS — N179 Acute kidney failure, unspecified: Secondary | ICD-10-CM | POA: Diagnosis not present

## 2021-07-29 DIAGNOSIS — E875 Hyperkalemia: Secondary | ICD-10-CM | POA: Diagnosis not present

## 2021-07-29 DIAGNOSIS — R001 Bradycardia, unspecified: Secondary | ICD-10-CM | POA: Diagnosis not present

## 2021-07-29 DIAGNOSIS — Z992 Dependence on renal dialysis: Secondary | ICD-10-CM | POA: Diagnosis not present

## 2021-07-29 DIAGNOSIS — R918 Other nonspecific abnormal finding of lung field: Secondary | ICD-10-CM | POA: Diagnosis not present

## 2021-07-29 LAB — CBC WITH DIFFERENTIAL/PLATELET
Abs Immature Granulocytes: 0.02 10*3/uL (ref 0.00–0.07)
Basophils Absolute: 0 10*3/uL (ref 0.0–0.1)
Basophils Relative: 0 %
Eosinophils Absolute: 0.2 10*3/uL (ref 0.0–0.5)
Eosinophils Relative: 4 %
HCT: 29.8 % — ABNORMAL LOW (ref 39.0–52.0)
Hemoglobin: 9.7 g/dL — ABNORMAL LOW (ref 13.0–17.0)
Immature Granulocytes: 0 %
Lymphocytes Relative: 19 %
Lymphs Abs: 1.1 10*3/uL (ref 0.7–4.0)
MCH: 31.1 pg (ref 26.0–34.0)
MCHC: 32.6 g/dL (ref 30.0–36.0)
MCV: 95.5 fL (ref 80.0–100.0)
Monocytes Absolute: 0.6 10*3/uL (ref 0.1–1.0)
Monocytes Relative: 11 %
Neutro Abs: 3.6 10*3/uL (ref 1.7–7.7)
Neutrophils Relative %: 66 %
Platelets: 227 10*3/uL (ref 150–400)
RBC: 3.12 MIL/uL — ABNORMAL LOW (ref 4.22–5.81)
RDW: 16.5 % — ABNORMAL HIGH (ref 11.5–15.5)
WBC: 5.5 10*3/uL (ref 4.0–10.5)
nRBC: 0 % (ref 0.0–0.2)

## 2021-07-29 LAB — COMPREHENSIVE METABOLIC PANEL
ALT: 29 U/L (ref 0–44)
AST: 22 U/L (ref 15–41)
Albumin: 3.3 g/dL — ABNORMAL LOW (ref 3.5–5.0)
Alkaline Phosphatase: 44 U/L (ref 38–126)
Anion gap: 25 — ABNORMAL HIGH (ref 5–15)
BUN: 156 mg/dL — ABNORMAL HIGH (ref 6–20)
CO2: 16 mmol/L — ABNORMAL LOW (ref 22–32)
Calcium: 8.3 mg/dL — ABNORMAL LOW (ref 8.9–10.3)
Chloride: 97 mmol/L — ABNORMAL LOW (ref 98–111)
Creatinine, Ser: 17.42 mg/dL — ABNORMAL HIGH (ref 0.61–1.24)
GFR, Estimated: 3 mL/min — ABNORMAL LOW (ref >60)
Glucose, Bld: 88 mg/dL (ref 70–99)
Potassium: 6.7 mmol/L (ref 3.5–5.1)
Sodium: 138 mmol/L (ref 135–145)
Total Bilirubin: 1.1 mg/dL (ref 0.3–1.2)
Total Protein: 6.9 g/dL (ref 6.5–8.1)

## 2021-07-29 NOTE — ED Provider Triage Note (Addendum)
Emergency Medicine Provider Triage Evaluation Note  Jason [name]. , a 61 y.o. male  was evaluated in triage.  Pt complains of shortness of breath.  Patient states that shortness of breath has not deviated from baseline since being on dialysis for years.  He notes of missing dialysis appointment on Saturday as well as Tuesday.  He states that he was sick with generalized myalgias/arthralgias and "feeling ill on Saturday.  He states that he was on his way to dialysis on Tuesday when the bill on his car broke and he was unable to make his appointment.  He presents emergency department to obtain dialysis tomorrow.  Denies any symptoms different from baseline.  Denies chest pain, abdominal pain, nausea/vomiting/diarrhea or urinary symptoms, change in bowel habits.  Dialysis days or Saturday Tuesday Thursday.  He states he makes little amounts of urine.  Review of Systems  Positive:  Negative:   Physical Exam  BP (!) 169/111 (BP Location: Right Arm)   Pulse 63   Temp 98.2 F (36.8 C) (Oral)   Resp 16   SpO2 96%  Gen:   Awake, no distress   Resp:  Normal effort  MSK:   Moves extremities without difficulty  Other:  No obvious rales, rhonchi, wheeze auscultation of lungs.  1+ pitting edema noted bilateral lower extremities.  Patient states this is baseline. Medical Decision Making  Medically screening exam initiated at 7:44 PM.  Appropriate orders placed.  Jason [name]. was informed that the remainder of the evaluation will be completed by another provider, this initial triage assessment does not replace that evaluation, and the importance of remaining in the ED until their evaluation is complete.    Jason [name], [place] 07/29/21 [place], [place], [place] 07/29/21 1948

## 2021-07-29 NOTE — Telephone Encounter (Signed)
Pt has been scheduled for 7/24 at 11:45.  I spoke to him and gave him appt info.  [place]e [place] his CT was a CT angio at [facility] on 5/12.  I just want to make sure you can pull what you need from PACS on that and nothing else needs to be done.

## 2021-07-29 NOTE — Telephone Encounter (Signed)
PCCM:  Orders placed for case on Monday   Garner Nash, DO [facility] 07/29/2021 8:26 AM

## 2021-07-29 NOTE — ED Triage Notes (Signed)
Pt here via [facility] for worse generalized fatigue after missing dialysis Tuesday and Saturday. Pt is normally O2 at home and states he titrates it as needed between 2L and 6L. Pt is currently on 3L at 99%. 130/82, 68HR.

## 2021-07-29 NOTE — Telephone Encounter (Signed)
I spoke with him and his wife yesterday evening via phone   [place]

## 2021-07-30 DIAGNOSIS — R0600 Dyspnea, unspecified: Secondary | ICD-10-CM | POA: Diagnosis not present

## 2021-07-30 MED ORDER — CLONIDINE HCL 0.1 MG PO TABS
0.1000 mg | ORAL_TABLET | Freq: Once | ORAL | Status: AC
  Administered 2021-07-30: 0.1 mg via ORAL
  Filled 2021-07-30: qty 1

## 2021-07-30 MED ORDER — LIDOCAINE HCL (PF) 1 % IJ SOLN: 5.0000 mL | INTRAMUSCULAR | Status: DC | PRN

## 2021-07-30 MED ORDER — LIDOCAINE-PRILOCAINE 2.5-2.5 % EX CREA
1.0000 | TOPICAL_CREAM | CUTANEOUS | Status: DC | PRN
Start: 2021-07-30 — End: 2021-07-30

## 2021-07-30 MED ORDER — PENTAFLUOROPROP-TETRAFLUOROETH EX AERO: 1.0000 | INHALATION_SPRAY | CUTANEOUS | Status: DC | PRN

## 2021-07-30 MED ORDER — HEPARIN SODIUM (PORCINE) 1000 UNIT/ML DIALYSIS: 1000.0000 [IU] | INTRAMUSCULAR | Status: DC | PRN

## 2021-07-30 MED ORDER — DEXTROSE 50 % IV SOLN
1.0000 | Freq: Once | INTRAVENOUS | Status: AC
  Administered 2021-07-30: 50 mL via INTRAVENOUS
  Filled 2021-07-30: qty 50

## 2021-07-30 MED ORDER — CHLORHEXIDINE GLUCONATE CLOTH 2 % EX PADS: 6.0000 | MEDICATED_PAD | Freq: Every day | CUTANEOUS | Status: DC

## 2021-07-30 MED ORDER — INSULIN ASPART 100 UNIT/ML IV SOLN
10.0000 [IU] | Freq: Once | INTRAVENOUS | Status: AC
  Administered 2021-07-30: 10 [IU] via INTRAVENOUS

## 2021-07-30 NOTE — Progress Notes (Addendum)
Pt receives out-pt HD at [facility] on TTS. Contacted clinic to make them aware pt currently in the ED. Pt arrives at 11:30 for 11:45 chair time. Clinic staff reports that pt normally drives himself to treatments. Attempted to speak to pt via phone but went straight to voicemail. Will assist as needed.  Melven Sartorius Renal Navigator [phone]  Addendum at 1:57 pm: Contacted [facility] to advise clinic of pt's d/c from ED today and that pt should resume care on Saturday.

## 2021-07-30 NOTE — ED Notes (Signed)
Discharge instructions reviewed with patient. Patient denies any questions or concerns. Pt ambulatory to front door.

## 2021-07-30 NOTE — ED Provider Notes (Signed)
Patient had emergent hemodialysis because of pulmonary edema. He is now post hemodialysis.  He feels a lot better.  Next dialysis will be on Saturday.  Patient noted to have no respiratory distress, no hypoxia.  Stable for discharge.   Varney Biles, MD 07/30/21 [phone]

## 2021-07-30 NOTE — Procedures (Signed)
I was present at this dialysis session. I have reviewed the session itself and made appropriate changes.  BP elevated will dose with clonidine and continue to UF.  Vital signs in last 24 hours:  Temp:  [97.5 F (36.4 C)-98.9 F (37.2 C)] 97.5 F (36.4 C) (07/20 1110) Pulse Rate:  [63-71] 64 (07/20 1400) Resp:  [16-21] 19 (07/20 1400) BP: (166-198)/(99-118) 198/115 (07/20 1400) SpO2:  [94 %-100 %] 98 % (07/20 1148) Weight:  [83.1 kg] 83.1 kg (07/20 1110) Weight change:  Filed Weights   07/30/21 1110  Weight: 83.1 kg    Recent Labs  Lab 07/29/21 1951  NA 138  K 6.7*  CL 97*  CO2 16*  GLUCOSE 88  BUN 156*  CREATININE 17.42*  CALCIUM 8.3*    Recent Labs  Lab 07/29/21 1951  WBC 5.5  NEUTROABS 3.6  HGB 9.7*  HCT 29.8*  MCV 95.5  PLT 227    Scheduled Meds:  Chlorhexidine Gluconate Cloth  6 each Topical Q0600   Continuous Infusions: PRN Meds:.lidocaine (PF), lidocaine-prilocaine, pentafluoroprop-tetrafluoroeth   Donetta Potts,  MD 07/30/2021, 2:33 PM

## 2021-07-30 NOTE — ED Notes (Signed)
Oxygen tank replaced °

## 2021-07-30 NOTE — Discharge Instructions (Signed)
Carry on with your dialysis on Saturday as planned.

## 2021-07-30 NOTE — Progress Notes (Addendum)
Received patient in bed, alert and oriented. Informed consent signed and in chart.  Time tx completed:1600  HD treatment completed. Patient tolerated well. Fistula/ without signs and symptoms of complications. Patient transported back to the room, alert and oriented and in no acute distress. Report given to bedside RN.  Total UF removed:  3949m   Medication given:   clonodine 0.190m Post HD VS:  97.7    188/105    69    21    98 on 3LNC  Discussed pt's increased BP while Dr CoMarval Regalas at bedside. Ok for pt to go home despite increased bp as long as he gets the clonidine he ordered and he confirmed w/ pt that he would take his home bp meds once he got home as he had not had them today.   Post HD weight:  pt refused to stand

## 2021-07-30 NOTE — ED Provider Notes (Signed)
[facility] EMERGENCY DEPARTMENT Provider Note   CSN: [id number] Arrival date & time: 07/29/21  1920     History  Chief Complaint  Patient presents with   Fatigue   Missed Dialysis    Jason [name]. is a 61 y.o. male.  HPI 61 year old male end-stage renal disease, on dialysis, has not been to dialysis for 1 week.  He reports difficulties with transportation and episodes of illness as reasons that he was unable to go to dialysis.  He has been getting increased dyspnea and presented here last night secondary to this.  He denies any fever, chills, nausea, vomiting, chest pain.  He is currently on oxygen and states he needs this intermittently.  He is being worked up for possible lung cancer.    Home Medications Prior to Admission medications   Medication Sig Start Date End Date Taking? Authorizing Provider  albuterol (VENTOLIN HFA) 108 (90 Base) MCG/ACT inhaler Inhale 2 puffs into the lungs every 6 (six) hours as needed for wheezing or shortness of breath. 07/06/21   Icard, Octavio Graves, DO  apixaban (ELIQUIS) 5 MG TABS tablet Take 1 tablet (5 mg total) by mouth 2 (two) times daily. Patient not taking: Reported on 07/29/2021 07/01/21 07/31/21  Loeffler, Adora Fridge, PA-C  carvedilol (COREG) 6.25 MG tablet Take 1 tablet (6.25 mg total) by mouth 2 (two) times daily. 07/22/21   Lelon Perla, MD  Fluticasone-Umeclidin-Vilant (TRELEGY ELLIPTA) 100-62.5-25 MCG/ACT AEPB Inhale 1 puff into the lungs daily. 07/06/21 10/04/21  Icard, Octavio Graves, DO  Fluticasone-Umeclidin-Vilant (TRELEGY ELLIPTA) 100-62.5-25 MCG/ACT AEPB Inhale 1 puff into the lungs daily. Patient not taking: Reported on 07/29/2021 07/06/21   June Leap L, DO  multivitamin (RENA-VIT) TABS tablet Take 1 tablet by mouth every morning. 10/20/17   [provider]  OXYGEN Inhale 3 L into the lungs as needed (shortness of breath).    [provider]  polyvinyl alcohol (LIQUIFILM TEARS) 1.4 % ophthalmic  solution Place 1 drop into both eyes daily as needed for dry eyes.    [provider]      Allergies    Lisinopril    Review of Systems   Review of Systems  Physical Exam Updated Vital Signs BP (!) 188/118 (BP Location: Right Arm)   Pulse 65   Temp 98.9 F (37.2 C) (Oral)   Resp 20   SpO2 100%  Physical Exam Vitals and nursing note reviewed.  Constitutional:      Comments: Significant hypertension noted  HENT:     Head: Normocephalic.     Right Ear: External ear normal.     Left Ear: External ear normal.  Eyes:     Pupils: Pupils are equal, round, and reactive to light.  Cardiovascular:     Rate and Rhythm: Normal rate and regular rhythm.     Pulses: Normal pulses.  Pulmonary:     Effort: Respiratory distress present.     Breath sounds: No stridor. No rhonchi.     Comments: Patient is tachypneic Abdominal:     Palpations: Abdomen is soft.  Musculoskeletal:        General: Normal range of motion.     Cervical back: Normal range of motion.     Comments: Trace edema bilateral lower extremities  Neurological:     General: No focal deficit present.     Mental Status: He is alert.  Psychiatric:        Mood and Affect: Mood normal.  ED Results / Procedures / Treatments   Labs (all labs ordered are listed, but only abnormal results are displayed) Labs Reviewed  COMPREHENSIVE METABOLIC PANEL - Abnormal; Notable for the following components:      Result Value   Potassium 6.7 (*)    Chloride 97 (*)    CO2 16 (*)    BUN 156 (*)    Creatinine, Ser 17.42 (*)    Calcium 8.3 (*)    Albumin 3.3 (*)    GFR, Estimated 3 (*)    Anion gap 25 (*)    All other components within normal limits  CBC WITH DIFFERENTIAL/PLATELET - Abnormal; Notable for the following components:   RBC 3.12 (*)    Hemoglobin 9.7 (*)    HCT 29.8 (*)    RDW 16.5 (*)    All other components within normal limits  HEPATITIS B SURFACE ANTIGEN  HEPATITIS B SURFACE ANTIBODY,QUALITATIVE   HEPATITIS B SURFACE ANTIBODY, QUANTITATIVE    EKG EKG Interpretation  Date/Time:  Wednesday July 29 2021 19:48:28 EDT Ventricular Rate:  59 PR Interval:  168 QRS Duration: 108 QT Interval:  442 QTC Calculation: 437 R Axis:   219 Text Interpretation: Sinus bradycardia Right superior axis deviation ST & T wave abnormality, consider inferior ischemia ST & T wave abnormality, consider anterolateral ischemia Abnormal ECG When compared with ECG of 01-Jul-2021 12:01, afib has resolved st changes previously present Confirmed by Pattricia Boss [phone]) on 07/30/2021 9:26:49 AM  Radiology DG Chest 2 View  Result Date: 07/29/2021 CLINICAL DATA:  Shortness of breath. EXAM: CHEST - 2 VIEW COMPARISON:  July 01, 2021 FINDINGS: Tortuosity of the aorta. Increased density in the hilum consistent with known lymphadenopathy and cystic structure. The cardiac silhouette is normal. There is no evidence of focal airspace consolidation, pleural effusion or pneumothorax. Left upper lobe mass visualized. Chronic elevation of the left hemidiaphragm. Osseous structures are without acute abnormality. Soft tissues are grossly normal. IMPRESSION: 1. Known left upper lobe mass. 2. Increased density in the hilum consistent with known lymphadenopathy and cystic structure. 3. No pneumothorax or pleural effusion. Electronically Signed   By: Fidela Salisbury M.D.   On: 07/29/2021 20:20    Procedures .Critical Care  Performed by: Pattricia Boss, MD Authorized by: Pattricia Boss, MD   Critical care provider statement:    Critical care time (minutes):  30   Critical care was necessary to treat or prevent imminent or life-threatening deterioration of the following conditions:  Respiratory failure and metabolic crisis   Critical care was time spent personally by me on the following activities:  Development of treatment plan with patient or surrogate, discussions with consultants, evaluation of patient's response to treatment,  examination of patient, ordering and review of laboratory studies, ordering and review of radiographic studies, ordering and performing treatments and interventions, pulse oximetry, re-evaluation of patient's condition and review of old charts     Medications Ordered in ED Medications  Chlorhexidine Gluconate Cloth 2 % PADS 6 each (has no administration in time range)  insulin aspart (novoLOG) injection 10 Units (10 Units Intravenous Given 07/30/21 0937)    And  dextrose 50 % solution 50 mL (50 mLs Intravenous Given 07/30/21 6045)    ED Course/ Medical Decision Making/ A&P                           Medical Decision Making 61 year old male on dialysis presents today with missed dialysis for 1 week.  He is having increased respiratory distress 1 respiratory failure due to missed dialysis 2 hyperkalemia Renal failure  Amount and/or Complexity of Data Reviewed Labs: ordered. Decision-making details documented in ED Course. Radiology: ordered and independent interpretation performed. Decision-making details documented in ED Course. ECG/medicine tests: ordered and independent interpretation performed. Decision-making details documented in ED Course. Discussion of management or test interpretation with external provider(s): Discussed with Dr. Osborne Casco, on-call for nephrology and plan dialysis with discharge home after  Risk OTC drugs. Prescription drug management.           Final Clinical Impression(s) / ED Diagnoses Final diagnoses:  Renal failure, unspecified chronicity  Other hypervolemia  Dyspnea, unspecified type  Hyperkalemia    Rx / DC Orders ED Discharge Orders     None         Pattricia Boss, MD 07/30/21 [phone]

## 2021-07-30 NOTE — Progress Notes (Signed)
Brief nephrology note  Patient presents after not being able to go to dialysis since 7/13.  Here with volume overload, hypertension, hyperkalemia.  Plan for observation dialysis today.  If the patient is stable after hemodialysis he would not require admission from nephrology perspective.  Patient should go to dialysis outpatient on Saturday

## 2021-07-31 ENCOUNTER — Other Ambulatory Visit: Payer: Self-pay

## 2021-07-31 ENCOUNTER — Encounter (HOSPITAL_COMMUNITY): Payer: Self-pay | Admitting: Pulmonary Disease

## 2021-07-31 ENCOUNTER — Other Ambulatory Visit: Payer: Self-pay | Admitting: Pulmonary Disease

## 2021-07-31 DIAGNOSIS — Z992 Dependence on renal dialysis: Secondary | ICD-10-CM | POA: Diagnosis not present

## 2021-07-31 DIAGNOSIS — N186 End stage renal disease: Secondary | ICD-10-CM | POA: Diagnosis not present

## 2021-07-31 DIAGNOSIS — I129 Hypertensive chronic kidney disease with stage 1 through stage 4 chronic kidney disease, or unspecified chronic kidney disease: Secondary | ICD-10-CM | POA: Diagnosis not present

## 2021-07-31 LAB — SARS CORONAVIRUS 2 (TAT 6-24 HRS): SARS Coronavirus 2: NEGATIVE

## 2021-07-31 NOTE — Progress Notes (Signed)
Anesthesia Chart Review:  Pt is a same day work up   Case: [id number] Date/Time: 08/03/21 1145   Procedures:      ROBOTIC ASSISTED NAVIGATIONAL BRONCHOSCOPY (Bilateral)     VIDEO BRONCHOSCOPY WITH ENDOBRONCHIAL ULTRASOUND (Bilateral)   Anesthesia type: General   Diagnosis: Lung nodules [R91.8]   Pre-op diagnosis: lung mass adenopathy   Location: [facility] ROOM 3 / [place] [facility]   Surgeons: Garner Nash, DO       DISCUSSION: Pt is 61 years old with hx CHF, HTN, pulmonary HTN, anemia, ESRD on hemodialysis TTS. Dyspnea - pt is using 2-6L of supplemental oxygen PRN  Pt has frequent ED visits and hospitalizations for missed dialysis  ED visit 07/29/21 for dyspnea, missed dialysis x1 week. Pt reportedly felt better after emergent dialysis   Pt saw Almyra Deforest, [place] with cardiology 05/13/21 after another hospitalization for missed dialysis. Pt reported needing supplemental oxygen for months, also reported LLE swelling. Mr. Eulas Post ordered CT angio which showed mediastinal mass (previously identified, thought to be benign cyst) and a new suprahilar nodule/mass; ordered LLE [place] which did not show DVT; and ordered an echo. Echo has not been done. I spoke with Mr. Eulas Post via secure chat. He notes that echo was ordered for dyspnea/use of oxygen and lung mass likely explains dyspnea and pt does not need echo prior to video bronch. Echo in 2021 and stress echo in 2020 were reassuring.   Eliquis is listed as an active medication but pt apparently never picked up prescription and never started taking it.    VS: Ht '6\' 2"'$  (1.88 m)   Wt 77.1 kg   BMI 21.83 kg/m   PROVIDERS: - PCP is [name], FNP - Cardiologist is Kirk Ruths, MD. Last office visit 05/13/21 with Almyra Deforest, [place] - Nephrologist is Madelon Lips, MD   LABS: results below in the context of ED visit for dyspnea, missed dialysis x1 week - CBC w/diff 07/29/21: H/H 9.7/29.8. This is consistent with prior results - CMP 07/29/21: K  6.7, Cr 17.42. Calcium 8.3. Albumin 3.3 - Will recheck BMP day of surgery   IMAGES: CXR 07/29/21:  1. Known left upper lobe mass. 2. Increased density in the hilum consistent with known lymphadenopathy and cystic structure. 3. No pneumothorax or pleural effusion.  PET scan 07/22/21:  1. Spiculated, hypermetabolic left upper lobe pulmonary nodule with hypermetabolic left hilar lymph nodes, highly suspicious for bronchogenic carcinoma. 2. 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 3. Focal hypermetabolic activity in the midthoracic spine suspicious for metastatic disease. No other evidence of distant metastases. 4. Evaluation of the head and neck limited by motion artifact. 5. Coronary and aortic atherosclerosis. Emphysema  CT angio chest 05/22/21:  - No definite evidence of pulmonary embolus. - 8.9 x 5.5 cm mass is noted in the left hilar and subcarinal region which demonstrates peripheral calcifications. Also noted is enlarged right hilar adenopathy. While this large mass may represent hyperdense bronchial cyst, malignancy or metastatic disease cannot be excluded, particularly given the presence of right hilar adenopathy. PET scan is recommended for further evaluation. - Also noted is 2.7 x 1.6 cm solid density in the left suprahilar region concerning for possible mass or nodule. - Aortic Atherosclerosis and Emphysema.  EKG 07/29/21: Sinus bradycardia. Right superior axis deviation. ST & T wave abnormality, consider inferior ischemia. ST & T wave abnormality,  consider anterolateral ischemia - Interpreting physician Dr. Pattricia Boss notes ST changes present on prior EKG   CV: Echo 02/18/19:  1. Left ventricular ejection fraction, by visual estimation, is 50 to 55%. The left ventricle has low normal function. There is moderately increased left ventricular hypertrophy.   2. Elevated left ventricular end-diastolic pressure.  3. Left ventricular diastolic parameters are consistent with Grade I diastolic dysfunction (impaired relaxation).  4. The left ventricle has no regional wall motion abnormalities.  5. Global right ventricle has normal systolic function.The right ventricular size is normal. No increase in right ventricular wall thickness.  6. Left atrial size was moderately dilated.  7. Right atrial size was normal.  8. The mitral valve is grossly normal. Mild mitral valve regurgitation.  9. The tricuspid valve is grossly normal.  10. The tricuspid valve is grossly normal. Tricuspid valve regurgitation is mild-moderate.  11. The aortic valve is tricuspid. Aortic valve regurgitation is not visualized. Very mild aortic valve stenosis.  12. The pulmonic valve was grossly normal. Pulmonic valve regurgitation is not visualized.  13. Moderately elevated pulmonary artery systolic pressure.  14. The inferior vena cava is normal in size with greater than 50% respiratory variability, suggesting right atrial pressure of 3 mmHg.   Stress echo 01/17/18 (care everywhere):  - The patient had no chest pain.  - The patient achieved 79 % of maximum predicted heart rate.  - The METS achieved was 8.  - Exercise capacity was fair.  - The baseline ECG displays normal sinus rhythm.  - No diagnostic ST segment changes were seen.  - Negative stress ECG for inducible ischemia at heart rate achieved.  - Normal left ventricular function at rest.  - There was normal left ventricular wall motion at rest  - The estimated LV ejection fraction is 55-60% .  - Normal left ventricular function and global wall motion with stress.  - The estimated LV ejection fraction is 65-70% with stress.  - Negative exercise echocardiography for inducible ischemia at heart rate  achieved.    Past Medical History:  Diagnosis Date   A-V fistula ([place])    Right arm   Abdominal hernia    AKI (acute  kidney injury) ([place]) 01/2015   Anasarca    Anemia    Arthritis    right shoulder and left knee   Asthma    as a child   Bilateral inguinal hernia    Bilateral pleural effusion 01/2017   Cellulitis 01/16/2017   Bilateral lower extremity   CHF (congestive heart failure) ([facility])    Chronic venous insufficiency    Concentric left ventricular hypertrophy 08/17/2017   Moderated, noted on ECHO   Diastolic dysfunction 93/81/0175   noted on ECHO   Dyspnea    oxygen 2L-6L via [place]   Elevated LFTs    per patient, resolved   End stage renal disease ([place])    Dialysis T/Th/Sa  Started 02/2017/pt is waiting for a kidney transplant   H/O right inguinal hernia repair 11/2017   History of colonoscopy 03/20/2018   History of elevated PSA 10/2017   Hypertension    Pneumonia    Pulmonary hypertension ([place])    Moderate   Venous stasis ulcer ([place])    bil legs/ healed up now per pt (03/06/18)    Past Surgical History:  Procedure Laterality Date   AV FISTULA PLACEMENT Right 01/20/2017   Procedure: ARTERIOVENOUS (AV) FISTULA CREATION;  Surgeon: Angelia Mould, MD;  Location: [place];  Service:  Vascular;  Laterality: Right;   COLONOSCOPY     HERNIA REPAIR     INSERTION OF DIALYSIS CATHETER Right 01/20/2017   Procedure: INSERTION OF DIALYSIS CATHETER;  Surgeon: Angelia Mould, MD;  Location: [place];  Service: Vascular;  Laterality: Right;   INSERTION OF MESH N/A 11/18/2017   Procedure: INSERTION OF MESH;  Surgeon: Michael Boston, MD;  Location: [facility];  Service: General;  Laterality: N/A;   IR FLUORO GUIDE CV LINE RIGHT  01/18/2017   IR LUMBAR DISC ASPIRATION W/IMG GUIDE  07/21/2020   IR US GUIDE VASC ACCESS RIGHT  01/18/2017   LAPAROSCOPIC INGUINAL HERNIA WITH UMBILICAL HERNIA Bilateral 11/18/2017   Procedure: LAPAROSCOPIC BILATERAL INGUINAL HERNIA REPAIR WITH UMBILICAL HERNIA REPAIR WITH MESH;  Surgeon: Michael Boston, MD;  Location: [facility];  Service: General;  Laterality: Bilateral;    TOE SURGERY     right fracture in big toe   TONSILLECTOMY      MEDICATIONS: No current facility-administered medications for this encounter.    albuterol (VENTOLIN HFA) 108 (90 Base) MCG/ACT inhaler   carvedilol (COREG) 6.25 MG tablet   Fluticasone-Umeclidin-Vilant (TRELEGY ELLIPTA) 100-62.5-25 MCG/ACT AEPB   multivitamin (RENA-VIT) TABS tablet   OXYGEN   polyvinyl alcohol (LIQUIFILM TEARS) 1.4 % ophthalmic solution   apixaban (ELIQUIS) 5 MG TABS tablet   Fluticasone-Umeclidin-Vilant (TRELEGY ELLIPTA) 100-62.5-25 MCG/ACT AEPB    If labs acceptable day of surgery, I anticipate pt can proceed with surgery as scheduled.   Willeen Cass, PhD, FNP-BC [facility]/Anesthesiology Phone: [phone] 07/31/2021 10:31 AM

## 2021-07-31 NOTE — Progress Notes (Signed)
Patient was given pre-op instructions over the phone. The opportunity was given for the patient to ask questions. No further questions asked.  Patient verbalized understanding of instructions that were given via phone.   PCP - Kathe Becton, NP Cardiologist - Dr Kirk Ruths Nephrology - Dr Madelon Lips  Chest x-ray - 07/29/21 (2V) EKG - 07/29/21 Stress Test - 01/17/18 CE ECHO - 05/13/21 Cardiac Cath - n/a  ICD Pacemaker/Loop - n/a  Sleep Study -  n/a CPAP - none  Patient is on Oxygen @ home between 2L-6L via [place].  Anesthesia review: Yes  STOP now taking any Aspirin (unless otherwise instructed by your surgeon), Aleve, Naproxen, Ibuprofen, Motrin, Advil, Goody's, BC's, all herbal medications, fish oil, and all vitamins.   Coronavirus Screening Do you have any of the following symptoms:  Cough yes/no: No Fever (>100.44F)  yes/no: No Runny nose yes/no: No Sore throat yes/no: No Difficulty breathing/shortness of breath  Yes  Have you traveled in the last 14 days and where? yes/no: No

## 2021-07-31 NOTE — Anesthesia Preprocedure Evaluation (Signed)
Anesthesia Evaluation  Patient identified by MRN, date of birth, ID band Patient awake    Reviewed: Allergy & Precautions, NPO status , Patient's Chart, lab work & pertinent test results, reviewed documented beta blocker date and time   Airway Mallampati: II  TM Distance: >3 FB Neck ROM: Full    Dental no notable dental hx.    Pulmonary pneumonia, former smoker,    Pulmonary exam normal        Cardiovascular hypertension, Pt. on medications and Pt. on home beta blockers Normal cardiovascular exam  Echo 02/18/19:  1. Left ventricular ejection fraction, by visual estimation, is 50 to 55%. The left ventricle has low normal function. There is moderately increased left ventricular hypertrophy.  2. Elevated left ventricular end-diastolic pressure.  3. Left ventricular diastolic parameters are consistent with Grade I diastolic dysfunction (impaired relaxation).  4. The left ventricle has no regional wall motion abnormalities.  5. Global right ventricle has normal systolic function.The right ventricular size is normal. No increase in right ventricular wall thickness.  6. Left atrial size was moderately dilated.  7. Right atrial size was normal.  8. The mitral valve is grossly normal. Mild mitral valve regurgitation.  9. The tricuspid valve is grossly normal.  10. The tricuspid valve is grossly normal. Tricuspid valve regurgitation is mild-moderate.  11. The aortic valve is tricuspid. Aortic valve regurgitation is not visualized. Very mild aortic valve stenosis.  12. The pulmonic valve was grossly normal. Pulmonic valve regurgitation is not visualized.  13. Moderately elevated pulmonary artery systolic pressure.  14. The inferior vena cava is normal in size with greater than 50% respiratory variability, suggesting right atrial pressure of 3 mmHg.   Stress echo 01/17/18 (care everywhere):  - The patient had no chest pain.  - The patient  achieved 79 % of maximum predicted heart rate.  - The METS achieved was 8.  - Exercise capacity was fair.  - The baseline ECG displays normal sinus rhythm.  - No diagnostic ST segment changes were seen.  - Negative stress ECG for inducible ischemia at heart rate achieved.  - Normal left ventricular function at rest.  - There was normal left ventricular wall motion at rest  - The estimated LV ejection fraction is 55-60% .  - Normal left ventricular function and global wall motion with stress.  - The estimated LV ejection fraction is 65-70% with stress.  - Negative exercise echocardiography for inducible ischemia at heart rate  achieved.     Neuro/Psych negative neurological ROS     GI/Hepatic negative GI ROS, Neg liver ROS,   Endo/Other  negative endocrine ROS  Renal/GU ESRF and DialysisRenal disease     Musculoskeletal negative musculoskeletal ROS (+)   Abdominal   Peds  Hematology  (+) Blood dyscrasia, anemia ,   Anesthesia Other Findings   Reproductive/Obstetrics                           Anesthesia Physical Anesthesia Plan  ASA: 3  Anesthesia Plan: General   Post-op Pain Management: Minimal or no pain anticipated   Induction: Intravenous  PONV Risk Score and Plan: 2 and Ondansetron  Airway Management Planned: Oral ETT  Additional Equipment:   Intra-op Plan:   Post-operative Plan: Extubation in OR  Informed Consent: I have reviewed the patients History and Physical, chart, labs and discussed the procedure including the risks, benefits and alternatives for the proposed anesthesia with the patient or authorized  representative who has indicated his/her understanding and acceptance.     Dental advisory given  Plan Discussed with: Anesthesiologist and CRNA  Anesthesia Plan Comments: (See APP note by Durel Salts, FNP )      Anesthesia Quick Evaluation

## 2021-08-03 ENCOUNTER — Encounter (HOSPITAL_COMMUNITY): Payer: Self-pay | Admitting: Pulmonary Disease

## 2021-08-03 ENCOUNTER — Encounter (HOSPITAL_COMMUNITY)
Admission: RE | Disposition: A | Discharge status: Home / Self Care | Payer: Self-pay | Source: Home / Self Care | Attending: Internal Medicine

## 2021-08-03 ENCOUNTER — Inpatient Hospital Stay (HOSPITAL_COMMUNITY): Payer: Medicare HMO

## 2021-08-03 ENCOUNTER — Ambulatory Visit (HOSPITAL_COMMUNITY): Payer: Medicare HMO

## 2021-08-03 ENCOUNTER — Telehealth: Payer: Self-pay | Admitting: Pulmonary Disease

## 2021-08-03 ENCOUNTER — Inpatient Hospital Stay (HOSPITAL_COMMUNITY)
Admission: RE | Admit: 2021-08-03 | Discharge: 2021-08-08 | DRG: 166 | Disposition: A | Discharge status: Home / Self Care | Payer: Medicare HMO | Attending: Internal Medicine | Admitting: Internal Medicine

## 2021-08-03 ENCOUNTER — Ambulatory Visit (HOSPITAL_COMMUNITY): Payer: Medicare HMO | Admitting: Emergency Medicine

## 2021-08-03 ENCOUNTER — Other Ambulatory Visit: Payer: Self-pay

## 2021-08-03 DIAGNOSIS — M898X9 Other specified disorders of bone, unspecified site: Secondary | ICD-10-CM | POA: Diagnosis present

## 2021-08-03 DIAGNOSIS — I4891 Unspecified atrial fibrillation: Secondary | ICD-10-CM | POA: Diagnosis present

## 2021-08-03 DIAGNOSIS — R911 Solitary pulmonary nodule: Principal | ICD-10-CM | POA: Diagnosis present

## 2021-08-03 DIAGNOSIS — I10 Essential (primary) hypertension: Secondary | ICD-10-CM | POA: Diagnosis not present

## 2021-08-03 DIAGNOSIS — Z888 Allergy status to other drugs, medicaments and biological substances status: Secondary | ICD-10-CM

## 2021-08-03 DIAGNOSIS — Z992 Dependence on renal dialysis: Secondary | ICD-10-CM | POA: Diagnosis not present

## 2021-08-03 DIAGNOSIS — Z91158 Patient's noncompliance with renal dialysis for other reason: Secondary | ICD-10-CM

## 2021-08-03 DIAGNOSIS — I469 Cardiac arrest, cause unspecified: Secondary | ICD-10-CM | POA: Diagnosis not present

## 2021-08-03 DIAGNOSIS — Z4682 Encounter for fitting and adjustment of non-vascular catheter: Secondary | ICD-10-CM | POA: Diagnosis not present

## 2021-08-03 DIAGNOSIS — Z7901 Long term (current) use of anticoagulants: Secondary | ICD-10-CM | POA: Diagnosis not present

## 2021-08-03 DIAGNOSIS — Z8249 Family history of ischemic heart disease and other diseases of the circulatory system: Secondary | ICD-10-CM | POA: Diagnosis not present

## 2021-08-03 DIAGNOSIS — J9859 Other diseases of mediastinum, not elsewhere classified: Secondary | ICD-10-CM | POA: Diagnosis not present

## 2021-08-03 DIAGNOSIS — I700 Atherosclerosis of aorta: Secondary | ICD-10-CM | POA: Diagnosis not present

## 2021-08-03 DIAGNOSIS — Z87891 Personal history of nicotine dependence: Secondary | ICD-10-CM | POA: Diagnosis not present

## 2021-08-03 DIAGNOSIS — I872 Venous insufficiency (chronic) (peripheral): Secondary | ICD-10-CM | POA: Diagnosis present

## 2021-08-03 DIAGNOSIS — N250 Renal osteodystrophy: Secondary | ICD-10-CM | POA: Diagnosis not present

## 2021-08-03 DIAGNOSIS — I132 Hypertensive heart and chronic kidney disease with heart failure and with stage 5 chronic kidney disease, or end stage renal disease: Secondary | ICD-10-CM | POA: Diagnosis not present

## 2021-08-03 DIAGNOSIS — R599 Enlarged lymph nodes, unspecified: Secondary | ICD-10-CM

## 2021-08-03 DIAGNOSIS — Z7682 Awaiting organ transplant status: Secondary | ICD-10-CM

## 2021-08-03 DIAGNOSIS — I517 Cardiomegaly: Secondary | ICD-10-CM | POA: Diagnosis not present

## 2021-08-03 DIAGNOSIS — Z20822 Contact with and (suspected) exposure to covid-19: Secondary | ICD-10-CM | POA: Diagnosis present

## 2021-08-03 DIAGNOSIS — M1990 Unspecified osteoarthritis, unspecified site: Secondary | ICD-10-CM | POA: Diagnosis present

## 2021-08-03 DIAGNOSIS — I462 Cardiac arrest due to underlying cardiac condition: Secondary | ICD-10-CM | POA: Diagnosis not present

## 2021-08-03 DIAGNOSIS — Z01818 Encounter for other preprocedural examination: Secondary | ICD-10-CM

## 2021-08-03 DIAGNOSIS — E875 Hyperkalemia: Secondary | ICD-10-CM | POA: Diagnosis not present

## 2021-08-03 DIAGNOSIS — I120 Hypertensive chronic kidney disease with stage 5 chronic kidney disease or end stage renal disease: Secondary | ICD-10-CM

## 2021-08-03 DIAGNOSIS — D631 Anemia in chronic kidney disease: Secondary | ICD-10-CM | POA: Diagnosis not present

## 2021-08-03 DIAGNOSIS — E8729 Other acidosis: Secondary | ICD-10-CM | POA: Diagnosis present

## 2021-08-03 DIAGNOSIS — N186 End stage renal disease: Secondary | ICD-10-CM | POA: Diagnosis not present

## 2021-08-03 DIAGNOSIS — Z79899 Other long term (current) drug therapy: Secondary | ICD-10-CM

## 2021-08-03 DIAGNOSIS — I5032 Chronic diastolic (congestive) heart failure: Secondary | ICD-10-CM | POA: Diagnosis not present

## 2021-08-03 DIAGNOSIS — R591 Generalized enlarged lymph nodes: Secondary | ICD-10-CM | POA: Diagnosis present

## 2021-08-03 DIAGNOSIS — Z5900 Homelessness unspecified: Secondary | ICD-10-CM

## 2021-08-03 DIAGNOSIS — J9621 Acute and chronic respiratory failure with hypoxia: Secondary | ICD-10-CM | POA: Diagnosis present

## 2021-08-03 DIAGNOSIS — R001 Bradycardia, unspecified: Secondary | ICD-10-CM | POA: Diagnosis not present

## 2021-08-03 DIAGNOSIS — J441 Chronic obstructive pulmonary disease with (acute) exacerbation: Secondary | ICD-10-CM | POA: Diagnosis present

## 2021-08-03 DIAGNOSIS — R918 Other nonspecific abnormal finding of lung field: Secondary | ICD-10-CM

## 2021-08-03 HISTORY — DX: Dyspnea, unspecified: R06.00

## 2021-08-03 HISTORY — PX: VIDEO BRONCHOSCOPY WITH ENDOBRONCHIAL ULTRASOUND: SHX6177

## 2021-08-03 HISTORY — PX: BRONCHIAL BIOPSY: SHX5109

## 2021-08-03 HISTORY — DX: Unspecified osteoarthritis, unspecified site: M19.90

## 2021-08-03 HISTORY — PX: FINE NEEDLE ASPIRATION: SHX5430

## 2021-08-03 HISTORY — PX: BRONCHIAL NEEDLE ASPIRATION BIOPSY: SHX5106

## 2021-08-03 LAB — COMPREHENSIVE METABOLIC PANEL
ALT: 46 U/L — ABNORMAL HIGH (ref 0–44)
ALT: 68 U/L — ABNORMAL HIGH (ref 0–44)
AST: 40 U/L (ref 15–41)
AST: 63 U/L — ABNORMAL HIGH (ref 15–41)
Albumin: 3 g/dL — ABNORMAL LOW (ref 3.5–5.0)
Albumin: 2.7 g/dL — ABNORMAL LOW (ref 3.5–5.0)
Alkaline Phosphatase: 39 U/L (ref 38–126)
Alkaline Phosphatase: 34 U/L — ABNORMAL LOW (ref 38–126)
Anion gap: 21 — ABNORMAL HIGH (ref 5–15)
Anion gap: 21 — ABNORMAL HIGH (ref 5–15)
BUN: 143 mg/dL — ABNORMAL HIGH (ref 6–20)
BUN: 144 mg/dL — ABNORMAL HIGH (ref 6–20)
CO2: 16 mmol/L — ABNORMAL LOW (ref 22–32)
CO2: 18 mmol/L — ABNORMAL LOW (ref 22–32)
Calcium: 8.9 mg/dL (ref 8.9–10.3)
Calcium: 8.1 mg/dL — ABNORMAL LOW (ref 8.9–10.3)
Chloride: 98 mmol/L (ref 98–111)
Chloride: 98 mmol/L (ref 98–111)
Creatinine, Ser: 15.75 mg/dL — ABNORMAL HIGH (ref 0.61–1.24)
Creatinine, Ser: 16.02 mg/dL — ABNORMAL HIGH (ref 0.61–1.24)
GFR, Estimated: 3 mL/min — ABNORMAL LOW (ref >60)
GFR, Estimated: 3 mL/min — ABNORMAL LOW (ref >60)
Glucose, Bld: 121 mg/dL — ABNORMAL HIGH (ref 70–99)
Glucose, Bld: 51 mg/dL — ABNORMAL LOW (ref 70–99)
Potassium: 7.3 mmol/L (ref 3.5–5.1)
Potassium: 6.2 mmol/L — ABNORMAL HIGH (ref 3.5–5.1)
Sodium: 135 mmol/L (ref 135–145)
Sodium: 137 mmol/L (ref 135–145)
Total Bilirubin: 0.5 mg/dL (ref 0.3–1.2)
Total Bilirubin: 0.7 mg/dL (ref 0.3–1.2)
Total Protein: 6.2 g/dL — ABNORMAL LOW (ref 6.5–8.1)
Total Protein: 5.8 g/dL — ABNORMAL LOW (ref 6.5–8.1)

## 2021-08-03 LAB — POCT I-STAT, CHEM 8
BUN: >130 mg/dL — ABNORMAL HIGH (ref 6–20)
BUN: >130 mg/dL — ABNORMAL HIGH (ref 6–20)
Calcium, Ion: 0.94 mmol/L — ABNORMAL LOW (ref 1.15–1.40)
Calcium, Ion: 1.23 mmol/L (ref 1.15–1.40)
Chloride: 98 mmol/L (ref 98–111)
Chloride: 102 mmol/L (ref 98–111)
Creatinine, Ser: >18 mg/dL — ABNORMAL HIGH (ref 0.61–1.24)
Creatinine, Ser: 16.2 mg/dL — ABNORMAL HIGH (ref 0.61–1.24)
Glucose, Bld: 90 mg/dL (ref 70–99)
Glucose, Bld: 120 mg/dL — ABNORMAL HIGH (ref 70–99)
HCT: 27 % — ABNORMAL LOW (ref 39.0–52.0)
HCT: 30 % — ABNORMAL LOW (ref 39.0–52.0)
Hemoglobin: 9.2 g/dL — ABNORMAL LOW (ref 13.0–17.0)
Hemoglobin: 10.2 g/dL — ABNORMAL LOW (ref 13.0–17.0)
Potassium: 5.9 mmol/L — ABNORMAL HIGH (ref 3.5–5.1)
Potassium: 7 mmol/L (ref 3.5–5.1)
Sodium: 133 mmol/L — ABNORMAL LOW (ref 135–145)
Sodium: 131 mmol/L — ABNORMAL LOW (ref 135–145)
TCO2: 19 mmol/L — ABNORMAL LOW (ref 22–32)
TCO2: 20 mmol/L — ABNORMAL LOW (ref 22–32)

## 2021-08-03 LAB — GLUCOSE, CAPILLARY
Glucose-Capillary: 58 mg/dL — ABNORMAL LOW (ref 70–99)
Glucose-Capillary: 66 mg/dL — ABNORMAL LOW (ref 70–99)
Glucose-Capillary: 79 mg/dL (ref 70–99)
Glucose-Capillary: 96 mg/dL (ref 70–99)

## 2021-08-03 LAB — POCT I-STAT 7, (LYTES, BLD GAS, ICA,H+H)
Acid-base deficit: 5 mmol/L — ABNORMAL HIGH (ref 0.0–2.0)
Bicarbonate: 20.8 mmol/L (ref 20.0–28.0)
Calcium, Ion: 1.03 mmol/L — ABNORMAL LOW (ref 1.15–1.40)
HCT: 24 % — ABNORMAL LOW (ref 39.0–52.0)
Hemoglobin: 8.2 g/dL — ABNORMAL LOW (ref 13.0–17.0)
O2 Saturation: 100 %
Patient temperature: 97.6
Potassium: 5.8 mmol/L — ABNORMAL HIGH (ref 3.5–5.1)
Sodium: 133 mmol/L — ABNORMAL LOW (ref 135–145)
TCO2: 22 mmol/L (ref 22–32)
pCO2 arterial: 39.9 mmHg (ref 32–48)
pH, Arterial: 7.323 — ABNORMAL LOW (ref 7.35–7.45)
pO2, Arterial: 341 mmHg — ABNORMAL HIGH (ref 83–108)

## 2021-08-03 LAB — CBC
HCT: 28.5 % — ABNORMAL LOW (ref 39.0–52.0)
HCT: 23.8 % — ABNORMAL LOW (ref 39.0–52.0)
Hemoglobin: 9.5 g/dL — ABNORMAL LOW (ref 13.0–17.0)
Hemoglobin: 8.2 g/dL — ABNORMAL LOW (ref 13.0–17.0)
MCH: 31 pg (ref 26.0–34.0)
MCH: 31.5 pg (ref 26.0–34.0)
MCHC: 33.3 g/dL (ref 30.0–36.0)
MCHC: 34.5 g/dL (ref 30.0–36.0)
MCV: 93.1 fL (ref 80.0–100.0)
MCV: 91.5 fL (ref 80.0–100.0)
Platelets: 270 10*3/uL (ref 150–400)
Platelets: 234 10*3/uL (ref 150–400)
RBC: 3.06 MIL/uL — ABNORMAL LOW (ref 4.22–5.81)
RBC: 2.6 MIL/uL — ABNORMAL LOW (ref 4.22–5.81)
RDW: 15.5 % (ref 11.5–15.5)
RDW: 15.4 % (ref 11.5–15.5)
WBC: 6.6 10*3/uL (ref 4.0–10.5)
WBC: 5.9 10*3/uL (ref 4.0–10.5)
nRBC: 0 % (ref 0.0–0.2)
nRBC: 0 % (ref 0.0–0.2)

## 2021-08-03 LAB — HEPATITIS C ANTIBODY: HCV Ab: NONREACTIVE

## 2021-08-03 LAB — TROPONIN I (HIGH SENSITIVITY): Troponin I (High Sensitivity): 481 ng/L (ref <18)

## 2021-08-03 LAB — HIV ANTIBODY (ROUTINE TESTING W REFLEX): HIV Screen 4th Generation wRfx: NONREACTIVE

## 2021-08-03 LAB — HEPATITIS B CORE ANTIBODY, TOTAL: Hep B Core Total Ab: NONREACTIVE

## 2021-08-03 LAB — PHOSPHORUS: Phosphorus: >30 mg/dL — ABNORMAL HIGH (ref 2.5–4.6)

## 2021-08-03 LAB — HEPATITIS B SURFACE ANTIBODY,QUALITATIVE: Hep B S Ab: NONREACTIVE

## 2021-08-03 SURGERY — BRONCHOSCOPY, WITH BIOPSY USING ELECTROMAGNETIC NAVIGATION
Anesthesia: General | Laterality: Bilateral

## 2021-08-03 MED ORDER — ALBUTEROL SULFATE HFA 108 (90 BASE) MCG/ACT IN AERS
INHALATION_SPRAY | RESPIRATORY_TRACT | Status: DC | PRN
  Administered 2021-08-03: 8 via RESPIRATORY_TRACT

## 2021-08-03 MED ORDER — MIDAZOLAM HCL 2 MG/2ML IJ SOLN
INTRAMUSCULAR | Status: DC | PRN
  Administered 2021-08-03: 2 mg via INTRAVENOUS

## 2021-08-03 MED ORDER — INSULIN REGULAR(HUMAN) IN NACL 100-0.9 UT/100ML-% IV SOLN: INTRAVENOUS | Status: DC | PRN

## 2021-08-03 MED ORDER — CHLORHEXIDINE GLUCONATE CLOTH 2 % EX PADS: 6.0000 | MEDICATED_PAD | Freq: Every day | CUTANEOUS | Status: DC

## 2021-08-03 MED ORDER — DOCUSATE SODIUM 100 MG PO CAPS: 100.0000 mg | ORAL_CAPSULE | Freq: Two times a day (BID) | ORAL | Status: DC | PRN

## 2021-08-03 MED ORDER — PROPOFOL 10 MG/ML IV BOLUS
INTRAVENOUS | Status: DC | PRN
  Administered 2021-08-03: 100 mg via INTRAVENOUS
  Administered 2021-08-03: 50 mg via INTRAVENOUS

## 2021-08-03 MED ORDER — LACTATED RINGERS IV SOLN
INTRAVENOUS | Status: DC
Start: 2021-08-03 — End: 2021-08-03

## 2021-08-03 MED ORDER — FENTANYL CITRATE (PF) 100 MCG/2ML IJ SOLN: 50.0000 ug | INTRAMUSCULAR | Status: DC | PRN

## 2021-08-03 MED ORDER — PROPOFOL 1000 MG/100ML IV EMUL
0.0000 ug/kg/min | INTRAVENOUS | Status: DC
  Administered 2021-08-03 – 2021-08-04 (×4): 50 ug/kg/min via INTRAVENOUS
  Filled 2021-08-03 (×4): qty 100

## 2021-08-03 MED ORDER — CALCIUM CHLORIDE 10 % IV SOLN
INTRAVENOUS | Status: DC | PRN
  Administered 2021-08-03: 1 g via INTRAVENOUS

## 2021-08-03 MED ORDER — ROCURONIUM BROMIDE 10 MG/ML (PF) SYRINGE
PREFILLED_SYRINGE | INTRAVENOUS | Status: DC | PRN
  Administered 2021-08-03: 80 mg via INTRAVENOUS
  Administered 2021-08-03: 10 mg via INTRAVENOUS

## 2021-08-03 MED ORDER — SODIUM BICARBONATE 4.2 % IV SOLN
INTRAVENOUS | Status: DC | PRN
  Administered 2021-08-03: 50 mL via INTRAVENOUS

## 2021-08-03 MED ORDER — CHLORHEXIDINE GLUCONATE 0.12 % MT SOLN
15.0000 mL | Freq: Once | OROMUCOSAL | Status: AC
  Administered 2021-08-03: 15 mL via OROMUCOSAL
  Filled 2021-08-03: qty 15

## 2021-08-03 MED ORDER — HYDRALAZINE HCL 20 MG/ML IJ SOLN
10.0000 mg | INTRAMUSCULAR | Status: DC | PRN
  Administered 2021-08-03: 10 mg via INTRAVENOUS
  Filled 2021-08-03: qty 1

## 2021-08-03 MED ORDER — EPINEPHRINE PF 1 MG/ML IJ SOLN
INTRAMUSCULAR | Status: DC | PRN
  Administered 2021-08-03: 2 mL via INTRAVENOUS
  Administered 2021-08-03 (×2): 1 mL via INTRAVENOUS
  Administered 2021-08-03: 5 mL via INTRAVENOUS
  Administered 2021-08-03: 1 mL via INTRAVENOUS

## 2021-08-03 MED ORDER — SODIUM CHLORIDE 0.9 % IV SOLN: INTRAVENOUS | Status: DC | PRN

## 2021-08-03 MED ORDER — PROPOFOL 500 MG/50ML IV EMUL
INTRAVENOUS | Status: DC | PRN
  Administered 2021-08-03: 150 ug/kg/min via INTRAVENOUS

## 2021-08-03 MED ORDER — DEXTROSE 50 % IV SOLN
1.0000 | Freq: Once | INTRAVENOUS | Status: AC
  Administered 2021-08-03: 50 mL via INTRAVENOUS
  Filled 2021-08-03: qty 50

## 2021-08-03 MED ORDER — LIDOCAINE 2% (20 MG/ML) 5 ML SYRINGE
INTRAMUSCULAR | Status: DC | PRN
  Administered 2021-08-03: 60 mg via INTRAVENOUS

## 2021-08-03 MED ORDER — ATROPINE SULFATE 0.4 MG/ML IV SOLN
INTRAVENOUS | Status: DC | PRN
  Administered 2021-08-03: .4 mg via INTRAVENOUS

## 2021-08-03 MED ORDER — DEXTROSE 50 % IV SOLN: 12.5000 g | INTRAVENOUS | Status: AC

## 2021-08-03 MED ORDER — EPHEDRINE SULFATE-NACL 50-0.9 MG/10ML-% IV SOSY
PREFILLED_SYRINGE | INTRAVENOUS | Status: DC | PRN
  Administered 2021-08-03 (×2): 10 mg via INTRAVENOUS

## 2021-08-03 MED ORDER — PANTOPRAZOLE 2 MG/ML SUSPENSION
40.0000 mg | Freq: Every day | ORAL | Status: DC
Start: 2021-08-03 — End: 2021-08-04
  Administered 2021-08-03: 40 mg
  Filled 2021-08-03: qty 20

## 2021-08-03 MED ORDER — DEXTROSE 50 % IV SOLN
INTRAVENOUS | Status: AC
  Administered 2021-08-03: 12.5 g via INTRAVENOUS
  Filled 2021-08-03: qty 50

## 2021-08-03 MED ORDER — CHLORHEXIDINE GLUCONATE CLOTH 2 % EX PADS
6.0000 | MEDICATED_PAD | Freq: Every day | CUTANEOUS | Status: DC
Start: 2021-08-04 — End: 2021-08-06
  Administered 2021-08-04: 6 via TOPICAL

## 2021-08-03 MED ORDER — DOCUSATE SODIUM 50 MG/5ML PO LIQD
100.0000 mg | Freq: Two times a day (BID) | ORAL | Status: DC
Start: 2021-08-03 — End: 2021-08-04
  Administered 2021-08-03: 100 mg
  Filled 2021-08-03: qty 10

## 2021-08-03 MED ORDER — HEPARIN SODIUM (PORCINE) 1000 UNIT/ML IJ SOLN
INTRAMUSCULAR | Status: AC
  Filled 2021-08-03: qty 6

## 2021-08-03 MED ORDER — CLEVIDIPINE BUTYRATE 0.5 MG/ML IV EMUL
0.0000 mg/h | INTRAVENOUS | Status: DC
  Administered 2021-08-04: 4 mg/h via INTRAVENOUS
  Administered 2021-08-04: 1 mg/h via INTRAVENOUS
  Filled 2021-08-03 (×4): qty 50

## 2021-08-03 MED ORDER — PHENYLEPHRINE 80 MCG/ML (10ML) SYRINGE FOR IV PUSH (FOR BLOOD PRESSURE SUPPORT)
PREFILLED_SYRINGE | INTRAVENOUS | Status: DC | PRN
  Administered 2021-08-03: 160 ug via INTRAVENOUS

## 2021-08-03 MED ORDER — ORAL CARE MOUTH RINSE
15.0000 mL | OROMUCOSAL | Status: DC
Start: 2021-08-04 — End: 2021-08-04
  Administered 2021-08-04 (×5): 15 mL via OROMUCOSAL

## 2021-08-03 MED ORDER — FENTANYL CITRATE PF 50 MCG/ML IJ SOSY: 50.0000 ug | PREFILLED_SYRINGE | INTRAMUSCULAR | Status: DC | PRN

## 2021-08-03 MED ORDER — HEPARIN SODIUM (PORCINE) 5000 UNIT/ML IJ SOLN
5000.0000 [IU] | Freq: Three times a day (TID) | INTRAMUSCULAR | Status: DC
Start: 2021-08-03 — End: 2021-08-05
  Administered 2021-08-03 – 2021-08-05 (×5): 5000 [IU] via SUBCUTANEOUS
  Filled 2021-08-03 (×6): qty 1

## 2021-08-03 MED ORDER — ORAL CARE MOUTH RINSE
15.0000 mL | OROMUCOSAL | Status: DC | PRN
  Administered 2021-08-05: 15 mL via OROMUCOSAL

## 2021-08-03 MED ORDER — POLYETHYLENE GLYCOL 3350 17 G PO PACK: 17.0000 g | PACK | Freq: Every day | ORAL | Status: DC | PRN

## 2021-08-03 MED ORDER — DEXTROSE 50 % IV SOLN
INTRAVENOUS | Status: DC | PRN
  Administered 2021-08-03: 25 mL via INTRAVENOUS

## 2021-08-03 MED ORDER — POLYETHYLENE GLYCOL 3350 17 G PO PACK: 17.0000 g | PACK | Freq: Every day | ORAL | Status: DC

## 2021-08-03 MED ORDER — DARBEPOETIN ALFA 100 MCG/0.5ML IJ SOSY
100.0000 ug | PREFILLED_SYRINGE | INTRAMUSCULAR | Status: DC
  Administered 2021-08-04: 100 ug via INTRAVENOUS
  Filled 2021-08-03 (×2): qty 0.5

## 2021-08-03 MED ORDER — SODIUM CHLORIDE 0.9 % IV SOLN
62.5000 mg | INTRAVENOUS | Status: DC
  Filled 2021-08-03: qty 5

## 2021-08-03 MED ORDER — FENTANYL CITRATE (PF) 250 MCG/5ML IJ SOLN
INTRAMUSCULAR | Status: DC | PRN
  Administered 2021-08-03: 100 ug via INTRAVENOUS
  Administered 2021-08-03 (×2): 50 ug via INTRAVENOUS

## 2021-08-03 MED ORDER — SODIUM ZIRCONIUM CYCLOSILICATE 10 G PO PACK
10.0000 g | PACK | Freq: Once | ORAL | Status: AC
  Administered 2021-08-03: 10 g via ORAL
  Filled 2021-08-03: qty 1

## 2021-08-03 MED ORDER — CALCIUM GLUCONATE 10 % IV SOLN
1.0000 g | Freq: Once | INTRAVENOUS | Status: DC
  Filled 2021-08-03: qty 10

## 2021-08-03 MED ORDER — SUGAMMADEX SODIUM 200 MG/2ML IV SOLN
INTRAVENOUS | Status: DC | PRN
  Administered 2021-08-03: 320 mg via INTRAVENOUS

## 2021-08-03 MED ORDER — INSULIN ASPART 100 UNIT/ML IV SOLN
10.0000 [IU] | Freq: Once | INTRAVENOUS | Status: AC
  Administered 2021-08-03: 10 [IU] via INTRAVENOUS

## 2021-08-03 MED ORDER — INSULIN ASPART 100 UNIT/ML IV SOLN: 10.0000 [IU] | Freq: Once | INTRAVENOUS | Status: DC

## 2021-08-03 MED ORDER — ONDANSETRON HCL 4 MG/2ML IJ SOLN
INTRAMUSCULAR | Status: DC | PRN
  Administered 2021-08-03: 4 mg via INTRAVENOUS

## 2021-08-03 MED ORDER — PHENYLEPHRINE HCL-NACL 20-0.9 MG/250ML-% IV SOLN
INTRAVENOUS | Status: DC | PRN
  Administered 2021-08-03: 50 ug/min via INTRAVENOUS

## 2021-08-03 MED ORDER — SODIUM BICARBONATE 8.4 % IV SOLN
50.0000 meq | Freq: Once | INTRAVENOUS | Status: AC
  Administered 2021-08-03: 50 meq via INTRAVENOUS
  Filled 2021-08-03: qty 50

## 2021-08-03 SURGICAL SUPPLY — 30 items

## 2021-08-03 NOTE — Op Note (Signed)
Video Bronchoscopy with Robotic Assisted Bronchoscopic Navigation  Video Bronchoscopy with Endobronchial Ultrasound Procedure Note  Date of Operation: 08/03/2021   Pre-op Diagnosis: Lung nodule   Post-op Diagnosis: Lung nodule  Surgeon: Garner Nash, DO   Assistants: None   Anesthesia: General endotracheal anesthesia  Operation: Flexible video fiberoptic bronchoscopy with robotic assistance and biopsies.  Estimated Blood Loss: Minimal  Complications: None  Indications and History: Jason [name]. is a 61 y.o. male with history of lung nodules. The risks, benefits, complications, treatment options and expected outcomes were discussed with the patient.  The possibilities of pneumothorax, pneumonia, reaction to medication, pulmonary aspiration, perforation of a viscus, bleeding, failure to diagnose a condition and creating a complication requiring transfusion or operation were discussed with the patient who freely signed the consent.    Description of Procedure: The patient was seen in the Preoperative Area, was examined and was deemed appropriate to proceed.  The patient was taken to [facility] endoscopy room 3, identified as Jason [name]. and the procedure verified as Flexible Video Fiberoptic Bronchoscopy.  A Time Out was held and the above information confirmed.   Prior to the date of the procedure a high-resolution CT scan of the chest was performed. Utilizing ION software program a virtual tracheobronchial tree was generated to allow the creation of distinct navigation pathways to the patient's parenchymal abnormalities. After being taken to the operating room general anesthesia was initiated and the patient  was orally intubated. The video fiberoptic bronchoscope was introduced via the endotracheal tube and a general inspection was performed which showed normal right and left lung anatomy, aspiration of the bilateral mainstems was completed to remove any remaining secretions. Robotic  catheter inserted into patient's endotracheal tube.   Target #1 LUL lung nodule: The distinct navigation pathways prepared prior to this procedure were then utilized to navigate to patient's lesion identified on CT scan. The robotic catheter was secured into place and the vision probe was withdrawn.  Lesion location was approximated using fluoroscopy and radial endobronchial ultrasound for peripheral targeting. Under fluoroscopic guidance transbronchial needle brushings, transbronchial needle biopsies, and transbronchial forceps biopsies were performed to be sent for cytology and pathology.   Target #2 station 11L: The standard scope was then withdrawn and the endobronchial ultrasound was used to identify and characterize the peritracheal, hilar and bronchial lymph nodes. 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  We also were able to identify the small area of concern within the left hilum consistent with lymphadenopathy. Using real-time ultrasound guidance Wang needle biopsies were take from Station 11L nodes and were sent for cytology. The patient tolerated the procedure well without apparent complications. There was no significant blood loss. The bronchoscope was withdrawn. Anesthesia was reversed and the patient was taken to the PACU for recovery.   At the end of the procedure a general airway inspection was performed and there was no evidence of active bleeding. The bronchoscope was removed.  The patient tolerated the procedure well. There was no significant blood loss and there were no obvious complications. A post-procedural chest x-ray is pending.  Samples Target #1: 1. Transbronchial Wang needle biopsies from LUL 2. Transbronchial forceps biopsies from LUL  Samples: 1. Wang needle biopsies from Station 7 node  Plans:  The patient will be discharged from the PACU to home when recovered from anesthesia and  after chest x-ray is reviewed. We will review the cytology, pathology  results with the patient when they become available. Outpatient followup will be with Garner Nash, DO.  Garner Nash, DO [facility] 08/03/2021 1:31 PM

## 2021-08-03 NOTE — Progress Notes (Signed)
eLink Physician-Brief Progress Note Patient Name: Jason [name]. DOB: November 02, 1960 MRN: [id number]   Date of Service  08/03/2021  HPI/Events of Note  Remains Hypertensive in spite of Hydralazine 10 mg IV. BP = 206/93.  eICU Interventions  Plan: Cleviprex IV infusion. Titrate to SBP < 170.     Intervention Category Major Interventions: Hypertension - evaluation and management  [name] 08/03/2021, 11:36 PM

## 2021-08-03 NOTE — Progress Notes (Signed)
eLink Physician-Brief Progress Note Patient Name: Jason [name]. DOB: 1960-08-21 MRN: [id number]   Date of Service  08/03/2021  HPI/Events of Note  Hypertension - BP = 200/89 with MAP = 120.  eICU Interventions  Plan: Hydralazine 10 mg IV Q 4 hours PRN SBP > 170 or DBP > 100.     Intervention Category Major Interventions: Hypertension - evaluation and management  Lee-Ann Gal Alf 08/03/2021, 8:56 PM

## 2021-08-03 NOTE — Telephone Encounter (Signed)
Order placed for covid test. Nothing further needed.

## 2021-08-03 NOTE — Anesthesia Procedure Notes (Signed)
Arterial Line Insertion Start/End7/24/2023 1:50 PM, 08/03/2021 1:55 PM Performed by: Leonor Liv, CRNA  Preanesthetic checklist: patient identified, IV checked, site marked, risks and benefits discussed, surgical consent, monitors and equipment checked, pre-op evaluation, timeout performed and anesthesia consent Emergency situation Patient sedated Left, radial was placed Catheter size: 20 G Hand hygiene performed  and maximum sterile barriers used  Allen's test indicative of satisfactory collateral circulation Attempts: 1 Procedure performed without using ultrasound guided technique. Following insertion, dressing applied and Biopatch. Patient tolerated the procedure well with no immediate complications.

## 2021-08-03 NOTE — H&P (Addendum)
.  NAME:  Jason Gearheart., MRN:  [id number], DOB:  1961/01/08, LOS: 0 ADMISSION DATE:  08/03/2021, CONSULTATION DATE:  08/03/2021 REFERRING MD:  June Leap, CHIEF COMPLAINT:  Cardiac arrest in ENDO    History of Present Illness:  Jason [name]. Is a 61yo male with a PMH of ESRD on iHD T/T/S (recently self discontinued iHD in favor of homeopathic treatments, CHF, HTN, pulmonary hypertension, anemia, and arthritis who presented for elective navigational bronchoscopy for evaluation of lung nodule.   Initially patient tolerated procedure well but upon waking from anesthesia patient began to cough on ETT with concern for vasovagal event leading to bradycardia and periarrest x2, of chest compressions and ACLS medications prior to stabilization, Patient will remain intubated and will be  admitted to critical care.   Pertinent  Medical History  ESRD on iHD T/T/S (recently self discontinued iHD in favor of homeopathic treatments, CHF, HTN, pulmonary hypertension, anemia, and arthritis  Significant Hospital Events: Including procedures, antibiotic start and stop dates in addition to other pertinent events   7/24 Presented for elective navigational bronch on awaking from anesthesia patient became brady and arrested x2  Interim History / Subjective:  As above   Objective   Blood pressure (!) 148/94, pulse 64, temperature 98 F (36.7 C), temperature source Oral, resp. rate 18, height '6\' 2"'$  (1.88 m), weight 79.4 kg, SpO2 95 %.        Intake/Output Summary (Last 24 hours) at 08/03/2021 1359 Last data filed at 08/03/2021 1249 Gross per 24 hour  Intake 350 ml  Output 1 ml  Net 349 ml   Filed Weights   07/31/21 0925 08/03/21 0935  Weight: 77.1 kg 79.4 kg    Examination: General: Acute on chronically ill appearing middle aged male, lying in bed in NAD HEENT: ETT, MM pink/moist, PERRL,  Neuro: Sedated on vent  CV: s1s2 regular rate and rhythm, no murmur, rubs, or gallops,  PULM:  Clear to  ascultation, no increased work of breathing, no added breath sounds GI: soft, bowel sounds active in all 4 quadrants, non-tender, non-distended, tolerating TF Extremities: warm/dry, no edema  Skin: no rashes or lesions  Resolved Hospital Problem list     Assessment & Plan:  S/p bradycardic arrest, suspect vasovagal component Peri-arrest x 2 in the immediate post-procedure period, suspect vasovagal component of bradycardia. Unclear if patient every truly lost pulses; 2 rounds of CPR, atropine/Epi. - Post-arrest monitoring, admit to ICU - Goal MAP > 65 - Fluid resuscitation as tolerated  - May require vasopressors titrated to goal MAP - Trend troponins - F/u post-arrest Echo   Acute hypoxemic respiratory failure Respiratory acidosis COPD, on HOT Former tobacco abuse, stopped 20 years ago Remained intubated post-procedure. - Continue full vent support (4-8cc/kg IBW) - Wean FiO2 for O2 sat > 90% - Daily WUA/SBT - VAP bundle - Continue bronchodilators - Pulmonary hygiene - PAD protocol for sedation: Propofol and Fentanyl for goal RASS 0 to -1 - F/u daily CXR - F/u ABG   Mediastinal mass S/p bronchoscopy 7/24. Originally noted in 2020, evaluated at [facility] with bx negative for malignancy. CT Chest at that time c/f mass vs. esophageal duplication/bronchial cyst. Repeat CT at [facility] similar, slightly larger in size.  - S/p bronchoscopy 7/24 (Dr. Valeta Harms) - F/u pathology - Repeat imaging TBD   Diastolic CHF Echo 01/1939 with EF 50-55%, moderately increased LVH, G1DD. - Needs HD for volume management - Cardiac monitoring - F/u repeat Echo   ESRD  on HD, noncompliant Patient with ESRD on HD, TTS schedule. Self-discontinued HD in favor of "homeopathic treatments", missed session 7/22. Listed for kidney transplant. Hyperkalemia  - Nephrology consulted - Will require RRT; uncertain if patient will tolerate HD given hypotension; may require temp HD cath/CRRT - Trend BMP - Replete  electrolytes as indicated - Monitor I&Os - Avoid nephrotoxic agents as able - Dose Lokelmia    Best Practice (right click and "Reselect all SmartList Selections" daily)   Diet/type: NPO DVT prophylaxis: prophylactic heparin  GI prophylaxis: PPI Lines: N/A Foley:  N/A Code Status:  full code Last date of multidisciplinary goals of care discussion: Daughter updated post arrest   Labs   CBC: Recent Labs  Lab 07/29/21 1951 08/03/21 1018  WBC 5.5  --   NEUTROABS 3.6  --   HGB 9.7* 9.2*  HCT 29.8* 27.0*  MCV 95.5  --   PLT 227  --     Basic Metabolic Panel: Recent Labs  Lab 07/29/21 1951 08/03/21 1018  NA 138 133*  K 6.7* 5.9*  CL 97* 98  CO2 16*  --   GLUCOSE 88 90  BUN 156* >130*  CREATININE 17.42* >18.00*  CALCIUM 8.3*  --    GFR: CrCl cannot be calculated (This lab value cannot be used to calculate CrCl because it is not a number: >18.00). Recent Labs  Lab 07/29/21 1951  WBC 5.5    Liver Function Tests: Recent Labs  Lab 07/29/21 1951  AST 22  ALT 29  ALKPHOS 44  BILITOT 1.1  PROT 6.9  ALBUMIN 3.3*   No results for input(s): "LIPASE", "AMYLASE" in the last 168 hours. No results for input(s): "AMMONIA" in the last 168 hours.  ABG    Component Value Date/Time   PHART 7.457 (H) 08/31/2020 0627   PCO2ART 39.9 08/31/2020 0627   PO2ART 52 (L) 08/31/2020 0627   HCO3 28.2 (H) 08/31/2020 0627   TCO2 19 (L) 08/03/2021 1018   ACIDBASEDEF 7.0 (H) 04/26/2019 1414   O2SAT 89.0 08/31/2020 0627     Coagulation Profile: No results for input(s): "INR", "PROTIME" in the last 168 hours.  Cardiac Enzymes: No results for input(s): "CKTOTAL", "CKMB", "CKMBINDEX", "TROPONINI" in the last 168 hours.  HbA1C: Hgb A1c MFr Bld  Date/Time Value Ref Range Status  01/17/2017 01:17 AM 4.9 4.8 - 5.6 % Final    Comment:    (NOTE) Pre diabetes:          5.7%-6.4% Diabetes:              >6.4% Glycemic control for   <7.0% adults with diabetes     CBG: No  results for input(s): "GLUCAP" in the last 168 hours.  Review of Systems:   Unable to assess   Past Medical History:  He,  has a past medical history of A-V fistula ([place]), Abdominal hernia, AKI (acute kidney injury) ([place]) (01/2015), Anasarca, Anemia, Arthritis, Asthma, Bilateral inguinal hernia, Bilateral pleural effusion (01/2017), Cellulitis (01/16/2017), CHF (congestive heart failure) ([place]), Chronic venous insufficiency, Concentric left ventricular hypertrophy (50/27/7412), Diastolic dysfunction (87/86/7672), Dyspnea, Elevated LFTs, End stage renal disease ([place]), H/O right inguinal hernia repair (11/2017), History of colonoscopy (03/20/2018), History of elevated PSA (10/2017), Hypertension, Pneumonia, Pulmonary hypertension ([place]), and Venous stasis ulcer ([place]).   Surgical History:   Past Surgical History:  Procedure Laterality Date   AV FISTULA PLACEMENT Right 01/20/2017   Procedure: ARTERIOVENOUS (AV) FISTULA CREATION;  Surgeon: Angelia Mould, MD;  Location: [place];  Service:  Vascular;  Laterality: Right;   COLONOSCOPY     HERNIA REPAIR     INSERTION OF DIALYSIS CATHETER Right 01/20/2017   Procedure: INSERTION OF DIALYSIS CATHETER;  Surgeon: Angelia Mould, MD;  Location: [facility];  Service: Vascular;  Laterality: Right;   INSERTION OF MESH N/A 11/18/2017   Procedure: INSERTION OF MESH;  Surgeon: Michael Boston, MD;  Location: [facility];  Service: General;  Laterality: N/A;   IR FLUORO GUIDE CV LINE RIGHT  01/18/2017   IR LUMBAR DISC ASPIRATION W/IMG GUIDE  07/21/2020   IR US GUIDE VASC ACCESS RIGHT  01/18/2017   LAPAROSCOPIC INGUINAL HERNIA WITH UMBILICAL HERNIA Bilateral 11/18/2017   Procedure: LAPAROSCOPIC BILATERAL INGUINAL HERNIA REPAIR WITH UMBILICAL HERNIA REPAIR WITH MESH;  Surgeon: Michael Boston, MD;  Location: [facility];  Service: General;  Laterality: Bilateral;   TOE SURGERY     right fracture in big toe   TONSILLECTOMY       Social History:   reports that he quit  smoking about 26 years ago. His smoking use included cigarettes. He has never used smokeless tobacco. He reports that he does not currently use alcohol. He reports that he does not currently use drugs after having used the following drugs: Marijuana.   Family History:  His family history includes Diabetes in his father; Healthy in his brother; Heart attack (age of onset: 62) in his mother; Heart disease in his mother; Hypertension in his mother; Prostate cancer in his maternal grandfather; Stroke in his mother. There is no history of Colon cancer, Colon polyps, Esophageal cancer, Stomach cancer, or Rectal cancer.   Allergies Allergies  Allergen Reactions   Lisinopril Cough     Home Medications  Prior to Admission medications   Medication Sig Start Date End Date Taking? Authorizing Provider  albuterol (VENTOLIN HFA) 108 (90 Base) MCG/ACT inhaler Inhale 2 puffs into the lungs every 6 (six) hours as needed for wheezing or shortness of breath. 07/06/21  Yes Icard, Bradley L, DO  carvedilol (COREG) 6.25 MG tablet Take 1 tablet (6.25 mg total) by mouth 2 (two) times daily. 07/22/21  Yes Lelon Perla, MD  Fluticasone-Umeclidin-Vilant (TRELEGY ELLIPTA) 100-62.5-25 MCG/ACT AEPB Inhale 1 puff into the lungs daily. 07/06/21 10/04/21 Yes Icard, Octavio Graves, DO  multivitamin (RENA-VIT) TABS tablet Take 1 tablet by mouth every morning. 10/20/17  Yes [provider]  OXYGEN Inhale 3 L into the lungs as needed (shortness of breath).   Yes [provider]  polyvinyl alcohol (LIQUIFILM TEARS) 1.4 % ophthalmic solution Place 1 drop into both eyes daily as needed for dry eyes.   Yes [provider]  apixaban (ELIQUIS) 5 MG TABS tablet Take 1 tablet (5 mg total) by mouth 2 (two) times daily. Patient not taking: Reported on 07/29/2021 07/01/21 07/31/21  Loeffler, Adora Fridge, PA-C  Fluticasone-Umeclidin-Vilant (TRELEGY ELLIPTA) 100-62.5-25 MCG/ACT AEPB Inhale 1 puff into the lungs daily. Patient  not taking: Reported on 07/29/2021 07/06/21   Garner Nash, DO     Critical care time: 41mns  Fredrik Mogel D. HKenton Kingfisher NP-C [facility] Personal contact information can be found on Amion  08/03/2021, 2:26 PM

## 2021-08-03 NOTE — Progress Notes (Signed)
PCCM:  Approximately 20 minutes.  Patient still waking up in endoscopy.  Notified that he had bradycardic event with waking up.  Patient was in.  Arrest state.  Code ran by Dr. Lanetta Inch and Smith Robert from anesthesiology.  Patient appeared to stabilize with bradycardia and blood pressure however he had a another episode with heart rate in the 20s and was given code medications and CPR was started for circulation.  Unclear if he ever truly lost pulses.  Completed approximately 2 cycles of CPR.  Patient responded to blood pressure medications and heart rate improved.  I called our critical care colleagues to come assess and admit to the intensive care unit. I have called the patient's daughter Rudell Ortman at the number provided in the chart to give her an update.  Garner Nash, DO [facility] 08/03/2021 2:11 PM

## 2021-08-03 NOTE — Progress Notes (Signed)
Dr. Tobias Alexander aware patient's potassium level is 5.9 on ISTAT result. Ok per Dr. Tobias Alexander.

## 2021-08-03 NOTE — Anesthesia Procedure Notes (Signed)
Procedure Name: Intubation Date/Time: 08/03/2021 12:10 PM  Performed by: Leonor Liv, CRNAPre-anesthesia Checklist: Patient identified, Emergency Drugs available, Suction available and Patient being monitored Patient Re-evaluated:Patient Re-evaluated prior to induction Oxygen Delivery Method: Circle System Utilized Preoxygenation: Pre-oxygenation with 100% oxygen Induction Type: IV induction Ventilation: Mask ventilation without difficulty and Oral airway inserted - appropriate to patient size Laryngoscope Size: Glidescope and 4 Grade View: Grade I Tube type: Oral Tube size: 8.5 mm Number of attempts: 2 (DLx1 MAC4 grade II unable to pass tube) Airway Equipment and Method: Oral airway, Rigid stylet and Video-laryngoscopy Placement Confirmation: ETT inserted through vocal cords under direct vision, positive ETCO2 and breath sounds checked- equal and bilateral Secured at: 22 cm Tube secured with: Tape Dental Injury: Teeth and Oropharynx as per pre-operative assessment

## 2021-08-03 NOTE — Discharge Instructions (Addendum)
Follow with Primary MD [name], FNP in 7 days   Get CBC, CMP,  checked  by Primary MD next visit.    Activity: As tolerated with Full fall precautions use walker/cane & assistance as needed   Disposition Home    Diet: Renal diet with 1200 cc fluid restrictions   On your next visit with your primary care physician please Get Medicines reviewed and adjusted.   Please request your Prim.MD to go over all Hospital Tests and Procedure/Radiological results at the follow up, please get all Hospital records sent to your Prim MD by signing hospital release before you go home.   If you experience worsening of your admission symptoms, develop shortness of breath, life threatening emergency, suicidal or homicidal thoughts you must seek medical attention immediately by calling 911 or calling your MD immediately  if symptoms less severe.  You Must read complete instructions/literature along with all the possible adverse reactions/side effects for all the Medicines you take and that have been prescribed to you. Take any new Medicines after you have completely understood and accpet all the possible adverse reactions/side effects.   Do not drive, operating heavy machinery, perform activities at heights, swimming or participation in water activities or provide baby sitting services if your were admitted for syncope or siezures until you have seen by Primary MD or a Neurologist and advised to do so again.  Do not drive when taking Pain medications.    Do not take more than prescribed Pain, Sleep and Anxiety Medications  Special Instructions: If you have smoked or chewed Tobacco  in the last 2 yrs please stop smoking, stop any regular Alcohol  and or any Recreational drug use.  Wear Seat belts while driving.   Please note  You were cared for by a hospitalist during your hospital stay. If you have any questions about your discharge medications or the care you received while you were in the  hospital after you are discharged, you can call the unit and asked to speak with the hospitalist on call if the hospitalist that took care of you is not available. Once you are discharged, your primary care physician will handle any further medical issues. Please note that NO REFILLS for any discharge medications will be authorized once you are discharged, as it is imperative that you return to your primary care physician (or establish a relationship with a primary care physician if you do not have one) for your aftercare needs so that they can reassess your need for medications and monitor your lab values.

## 2021-08-03 NOTE — Consult Note (Signed)
Renal Service Consult Note [name]  Jason [name]. 08/03/2021 Jason Blazing, MD Requesting Physician: Dr. Valeta Harms  Reason for Consult: ESRD sp possible arrest during bronchoscopy HPI: The patient is a 61 y.o. year-old w/ hx of ESRD on HD, HTN, pHTN who presented for bronchoscopy today to assess some CT changes in the mediastinum. Post procedure pt had a brief arrest (2-3 min CPR, given code medications). Pt intubated now in ICU. Asked to see for esrd.   Pt seen in ICU. He is sedated now. Per CCM he missed 1-2 wks of HD. No hx obtained.  ROS - n/a   Past Medical History  Past Medical History:  Diagnosis Date   A-V fistula (HCC)    Right arm   Abdominal hernia    AKI (acute kidney injury) ([place]) 01/2015   Anasarca    Anemia    Arthritis    right shoulder and left knee   Asthma    as a child   Bilateral inguinal hernia    Bilateral pleural effusion 01/2017   Cellulitis 01/16/2017   Bilateral lower extremity   CHF (congestive heart failure) (HCC)    Chronic venous insufficiency    Concentric left ventricular hypertrophy 08/17/2017   Moderated, noted on ECHO   Diastolic dysfunction 88/82/8003   noted on ECHO   Dyspnea    oxygen 2L-6L via [place]   Elevated LFTs    per patient, resolved   End stage renal disease ([place])    Dialysis T/Th/Sa  Started 02/2017/pt is waiting for a kidney transplant   H/O right inguinal hernia repair 11/2017   History of colonoscopy 03/20/2018   History of elevated PSA 10/2017   Hypertension    Pneumonia    Pulmonary hypertension ([place])    Moderate   Venous stasis ulcer ([place])    bil legs/ healed up now per pt (03/06/18)   Past Surgical History  Past Surgical History:  Procedure Laterality Date   AV FISTULA PLACEMENT Right 01/20/2017   Procedure: ARTERIOVENOUS (AV) FISTULA CREATION;  Surgeon: Angelia Mould, MD;  Location: [place];  Service: Vascular;  Laterality: Right;   COLONOSCOPY     HERNIA REPAIR     INSERTION  OF DIALYSIS CATHETER Right 01/20/2017   Procedure: INSERTION OF DIALYSIS CATHETER;  Surgeon: Angelia Mould, MD;  Location: [place];  Service: Vascular;  Laterality: Right;   INSERTION OF MESH N/A 11/18/2017   Procedure: INSERTION OF MESH;  Surgeon: Michael Boston, MD;  Location: [facility];  Service: General;  Laterality: N/A;   IR FLUORO GUIDE CV LINE RIGHT  01/18/2017   IR LUMBAR DISC ASPIRATION W/IMG GUIDE  07/21/2020   IR US GUIDE VASC ACCESS RIGHT  01/18/2017   LAPAROSCOPIC INGUINAL HERNIA WITH UMBILICAL HERNIA Bilateral 11/18/2017   Procedure: LAPAROSCOPIC BILATERAL INGUINAL HERNIA REPAIR WITH UMBILICAL HERNIA REPAIR WITH MESH;  Surgeon: Michael Boston, MD;  Location: [facility];  Service: General;  Laterality: Bilateral;   TOE SURGERY     right fracture in big toe   TONSILLECTOMY     Family History  Family History  Problem Relation Age of Onset   Heart attack Mother 24       CABG hx   Heart disease Mother    Hypertension Mother    Stroke Mother    Diabetes Father    Healthy Brother    Prostate cancer Maternal Grandfather    Colon cancer Neg Hx    Colon polyps Neg Hx  Esophageal cancer Neg Hx    Stomach cancer Neg Hx    Rectal cancer Neg Hx    Social History  reports that he quit smoking about 26 years ago. His smoking use included cigarettes. He has never used smokeless tobacco. He reports that he does not currently use alcohol. He reports that he does not currently use drugs after having used the following drugs: Marijuana. Allergies  Allergies  Allergen Reactions   Lisinopril Cough   Home medications Prior to Admission medications   Medication Sig Start Date End Date Taking? Authorizing Provider  albuterol (VENTOLIN HFA) 108 (90 Base) MCG/ACT inhaler Inhale 2 puffs into the lungs every 6 (six) hours as needed for wheezing or shortness of breath. 07/06/21  Yes Icard, Bradley L, DO  carvedilol (COREG) 6.25 MG tablet Take 1 tablet (6.25 mg total) by mouth 2 (two) times  daily. 07/22/21  Yes Lelon Perla, MD  Fluticasone-Umeclidin-Vilant (TRELEGY ELLIPTA) 100-62.5-25 MCG/ACT AEPB Inhale 1 puff into the lungs daily. 07/06/21 10/04/21 Yes Icard, Octavio Graves, DO  multivitamin (RENA-VIT) TABS tablet Take 1 tablet by mouth every morning. 10/20/17  Yes [provider]  OXYGEN Inhale 3 L into the lungs as needed (shortness of breath).   Yes [provider]  polyvinyl alcohol (LIQUIFILM TEARS) 1.4 % ophthalmic solution Place 1 drop into both eyes daily as needed for dry eyes.   Yes [provider]  apixaban (ELIQUIS) 5 MG TABS tablet Take 1 tablet (5 mg total) by mouth 2 (two) times daily. Patient not taking: Reported on 07/29/2021 07/01/21 07/31/21  Loeffler, Adora Fridge, PA-C  Fluticasone-Umeclidin-Vilant (TRELEGY ELLIPTA) 100-62.5-25 MCG/ACT AEPB Inhale 1 puff into the lungs daily. Patient not taking: Reported on 07/29/2021 07/06/21   Garner Nash, DO     Vitals:   07/31/21 9470 08/03/21 0935 08/03/21 1458  BP:  (!) 148/94 100/72  Pulse:  64 62  Resp:  18 18  Temp:  98 F (36.7 C)   TempSrc:  Oral   SpO2:  95% 100%  Weight: 77.1 kg 79.4 kg   Height: '6\' 2"'$  (1.88 m) '6\' 2"'$  (1.88 m)    Exam Gen on vent, sedated No rash, cyanosis or gangrene Sclera anicteric, throat w/ ETT No jvd or bruits Chest clear anterior/ lateral RRR no MRG Abd soft ntnd no mass or ascites +bs GU normal male MS no joint effusions or deformity Ext no LE or UE edema, no wounds or ulcers Neuro is on vent, sedated   RUA AVF+ aneurysmal, +bruit   Home meds include - apixaban, carvedilol, trelegy ellipta, renavit, home O2, prns/ vits/ supps   [facility] HD: [facility]  4h 3mn   400/800   76kjg  2/2 bath   RUA AVF   Hep 6000 - last HD 7/13, post wt 76.3kg  - last Hb 9.7, tsat 22% - mircera 100 q 2wks, last 7/5, due 7/19 (missed) - venofer 100 mg q he thru 8/05 - doxercalciferol 7 ug tiw IV     Na 135  K+ 7.3  BUN 143  Creat 15  CA 8.9  WBC 6k Hb 9.5     CXR - no  edema   Assessment/ Plan: Cardiac arrest - brief w/ 2-3 min CPR, on vent in ICU SP bronchoscopy - done today Hyperkalemia - repeat K+ is 7.3. Getting IV Ca, ins/ glu, bicarb and lokelma. Plan HD this evening, using low K+ bath.  ESRD - on HD TTS. Has missed last 4 HD sessions.  HD as above.  Anemia esrd - Hb 9s, will give esa w/ next HD here. Cont IV fe load.  MBD ckd - CCa in range, will add on phos. Cont IV vdra.       Rob Merrik [place]  MD 08/03/2021, 3:05 PM Recent Labs  Lab 07/29/21 1951 08/03/21 1018 08/03/21 1402 08/03/21 1403  HGB 9.7*   < > 10.2* 9.5*  ALBUMIN 3.3*  --   --  3.0*  CALCIUM 8.3*  --   --  8.9  CREATININE 17.42*   < > 16.20* 15.75*  K 6.7*   < > 7.0* 7.3*   < > = values in this interval not displayed.

## 2021-08-03 NOTE — Interval H&P Note (Signed)
History and Physical Interval Note:  08/03/2021 11:15 AM  Jason [name].  has presented today for surgery, with the diagnosis of lung mass adenopathy.  The various methods of treatment have been discussed with the patient and family. After consideration of risks, benefits and other options for treatment, the patient has consented to  Procedure(s): ROBOTIC ASSISTED NAVIGATIONAL BRONCHOSCOPY (Bilateral) VIDEO BRONCHOSCOPY WITH ENDOBRONCHIAL ULTRASOUND (Bilateral) as a surgical intervention.  The patient's history has been reviewed, patient examined, no change in status, stable for surgery.  I have reviewed the patient's chart and labs.  Questions were answered to the patient's satisfaction.     [place]

## 2021-08-03 NOTE — Transfer of Care (Signed)
Immediate Anesthesia Transfer of Care Note  Patient: Jason [name].  Procedure(s) Performed: ROBOTIC ASSISTED NAVIGATIONAL BRONCHOSCOPY (Bilateral) VIDEO BRONCHOSCOPY WITH ENDOBRONCHIAL ULTRASOUND (Bilateral) BRONCHIAL NEEDLE ASPIRATION BIOPSIES BRONCHIAL BIOPSIES FINE NEEDLE ASPIRATION (FNA) LINEAR  Patient Location: ICU  Anesthesia Type:General  Level of Consciousness: sedated  Airway & Oxygen Therapy: Patient Spontanous Breathing, Patient remains intubated per anesthesia plan and Patient placed on Ventilator (see vital sign flow sheet for setting)  Post-op Assessment: Report given to RN and Post -op Vital signs reviewed and stable  Post vital signs: Reviewed and stable  Last Vitals:  Vitals Value Taken Time  BP 116/80 08/03/21 1515  Temp    Pulse 58 08/03/21 1523  Resp 16 08/03/21 1523  SpO2 100 % 08/03/21 1523  Vitals shown include unvalidated device data.  Last Pain:  Vitals:   08/03/21 0950  TempSrc:   PainSc: 0-No pain      Patients Stated Pain Goal: 0 (25/00/37 0488)  Complications: No notable events documented.

## 2021-08-03 NOTE — Progress Notes (Signed)
eLink Physician-Brief Progress Note Patient Name: Jason [name]. DOB: [birth year] MRN: [id number]   Date of Service  08/03/2021  HPI/Events of Note  Nursing request to D/C order to discharge patient.   eICU Interventions  Order to D/C patient cancelled.      Intervention Category Major Interventions: Other:  Donie, [place] 08/03/2021, 10:31 PM

## 2021-08-04 ENCOUNTER — Inpatient Hospital Stay (HOSPITAL_COMMUNITY): Payer: Medicare HMO

## 2021-08-04 ENCOUNTER — Encounter (HOSPITAL_COMMUNITY): Payer: Self-pay | Admitting: Pulmonary Disease

## 2021-08-04 DIAGNOSIS — R918 Other nonspecific abnormal finding of lung field: Secondary | ICD-10-CM | POA: Diagnosis not present

## 2021-08-04 DIAGNOSIS — I469 Cardiac arrest, cause unspecified: Secondary | ICD-10-CM | POA: Diagnosis not present

## 2021-08-04 LAB — HEPATITIS B SURFACE ANTIGEN: Hepatitis B Surface Ag: NONREACTIVE

## 2021-08-04 LAB — COMPREHENSIVE METABOLIC PANEL
ALT: 67 U/L — ABNORMAL HIGH (ref 0–44)
AST: 56 U/L — ABNORMAL HIGH (ref 15–41)
Albumin: 2.7 g/dL — ABNORMAL LOW (ref 3.5–5.0)
Alkaline Phosphatase: 32 U/L — ABNORMAL LOW (ref 38–126)
Anion gap: 18 — ABNORMAL HIGH (ref 5–15)
BUN: 65 mg/dL — ABNORMAL HIGH (ref 6–20)
CO2: 21 mmol/L — ABNORMAL LOW (ref 22–32)
Calcium: 8 mg/dL — ABNORMAL LOW (ref 8.9–10.3)
Chloride: 96 mmol/L — ABNORMAL LOW (ref 98–111)
Creatinine, Ser: 8.57 mg/dL — ABNORMAL HIGH (ref 0.61–1.24)
GFR, Estimated: 7 mL/min — ABNORMAL LOW (ref >60)
Glucose, Bld: 95 mg/dL (ref 70–99)
Potassium: 3.7 mmol/L (ref 3.5–5.1)
Sodium: 135 mmol/L (ref 135–145)
Total Bilirubin: 1 mg/dL (ref 0.3–1.2)
Total Protein: 5.9 g/dL — ABNORMAL LOW (ref 6.5–8.1)

## 2021-08-04 LAB — ECHOCARDIOGRAM COMPLETE
AR max vel: 2.11 cm2
AV Area VTI: 2.19 cm2
AV Area mean vel: 2.06 cm2
AV Mean grad: 12 mmHg
AV Peak grad: 21.3 mmHg
Ao pk vel: 2.31 m/s
Area-P 1/2: 4.15 cm2
Calc EF: 64.1 %
Height: 74 in
S' Lateral: 3.1 cm
Single Plane A2C EF: 66.8 %
Single Plane A4C EF: 64.3 %
Weight: 2874.8 oz

## 2021-08-04 LAB — GLUCOSE, CAPILLARY
Glucose-Capillary: 65 mg/dL — ABNORMAL LOW (ref 70–99)
Glucose-Capillary: 76 mg/dL (ref 70–99)
Glucose-Capillary: 90 mg/dL (ref 70–99)
Glucose-Capillary: 98 mg/dL (ref 70–99)
Glucose-Capillary: 99 mg/dL (ref 70–99)

## 2021-08-04 LAB — CBC
HCT: 24.4 % — ABNORMAL LOW (ref 39.0–52.0)
Hemoglobin: 8.4 g/dL — ABNORMAL LOW (ref 13.0–17.0)
MCH: 31 pg (ref 26.0–34.0)
MCHC: 34.4 g/dL (ref 30.0–36.0)
MCV: 90 fL (ref 80.0–100.0)
Platelets: 242 10*3/uL (ref 150–400)
RBC: 2.71 MIL/uL — ABNORMAL LOW (ref 4.22–5.81)
RDW: 15.3 % (ref 11.5–15.5)
WBC: 6.2 10*3/uL (ref 4.0–10.5)
nRBC: 0 % (ref 0.0–0.2)

## 2021-08-04 LAB — PHOSPHORUS: Phosphorus: 6.5 mg/dL — ABNORMAL HIGH (ref 2.5–4.6)

## 2021-08-04 LAB — CYTOLOGY - NON PAP

## 2021-08-04 LAB — MAGNESIUM: Magnesium: 1.8 mg/dL (ref 1.7–2.4)

## 2021-08-04 LAB — TRIGLYCERIDES: Triglycerides: 83 mg/dL (ref <150)

## 2021-08-04 LAB — POTASSIUM: Potassium: 4.5 mmol/L (ref 3.5–5.1)

## 2021-08-04 MED ORDER — PANTOPRAZOLE SODIUM 40 MG PO TBEC
40.0000 mg | DELAYED_RELEASE_TABLET | Freq: Every day | ORAL | Status: DC
  Administered 2021-08-04 – 2021-08-05 (×2): 40 mg via ORAL
  Filled 2021-08-04 (×4): qty 1

## 2021-08-04 MED ORDER — DEXTROSE 50 % IV SOLN
12.5000 g | INTRAVENOUS | Status: AC
Start: 2021-08-04 — End: 2021-08-04
  Administered 2021-08-04: 12.5 g via INTRAVENOUS

## 2021-08-04 MED ORDER — FENTANYL CITRATE PF 50 MCG/ML IJ SOSY
25.0000 ug | PREFILLED_SYRINGE | INTRAMUSCULAR | Status: DC | PRN
  Administered 2021-08-05 – 2021-08-07 (×8): 50 ug via INTRAVENOUS
  Filled 2021-08-04 (×10): qty 1

## 2021-08-04 MED ORDER — DOCUSATE SODIUM 100 MG PO CAPS
100.0000 mg | ORAL_CAPSULE | Freq: Two times a day (BID) | ORAL | Status: DC
  Administered 2021-08-04: 100 mg via ORAL
  Filled 2021-08-04 (×5): qty 1

## 2021-08-04 MED ORDER — AMLODIPINE BESYLATE 10 MG PO TABS
10.0000 mg | ORAL_TABLET | Freq: Once | ORAL | Status: AC
  Administered 2021-08-04: 10 mg via ORAL
  Filled 2021-08-04: qty 1

## 2021-08-04 MED ORDER — AMLODIPINE BESYLATE 10 MG PO TABS: 10.0000 mg | ORAL_TABLET | Freq: Every day | ORAL | Status: DC

## 2021-08-04 MED ORDER — HEPARIN SODIUM (PORCINE) 1000 UNIT/ML IJ SOLN
INTRAMUSCULAR | Status: AC
  Filled 2021-08-04: qty 3

## 2021-08-04 MED ORDER — POLYETHYLENE GLYCOL 3350 17 G PO PACK
17.0000 g | PACK | Freq: Every day | ORAL | Status: DC
  Filled 2021-08-04 (×3): qty 1

## 2021-08-04 MED ORDER — ORAL CARE MOUTH RINSE
15.0000 mL | OROMUCOSAL | Status: DC
  Administered 2021-08-04 (×2): 15 mL via OROMUCOSAL

## 2021-08-04 MED ORDER — SODIUM CHLORIDE 0.9 % IV SOLN
125.0000 mg | INTRAVENOUS | Status: DC
  Administered 2021-08-04 – 2021-08-08 (×3): 125 mg via INTRAVENOUS
  Filled 2021-08-04 (×3): qty 10

## 2021-08-04 MED ORDER — CARVEDILOL 6.25 MG PO TABS
6.2500 mg | ORAL_TABLET | Freq: Two times a day (BID) | ORAL | Status: DC
  Administered 2021-08-04 – 2021-08-08 (×9): 6.25 mg via ORAL
  Filled 2021-08-04 (×9): qty 1

## 2021-08-04 NOTE — Progress Notes (Signed)
.  NAME:  Jason [name]., MRN:  [id number], DOB:  1960-06-19, LOS: 1 ADMISSION DATE:  08/03/2021, CONSULTATION DATE:  08/03/2021 REFERRING MD:  June Leap, CHIEF COMPLAINT:  Cardiac arrest in ENDO    History of Present Illness:  61 year old man who presented to [facility] 7/24 for elective navigational bronchoscopy for evaluation of lung nodule. Subsequently experienced bradycardic arrest post-procedure. PMHx significant for ESRD on iHD T/T/S (recently self discontinued iHD in favor of homeopathic treatments, Afib (on Eliquis), CHF, HTN, pulmonary hypertension, anemia, and arthritis.  Initially patient tolerated procedure well but upon waking from anesthesia patient began to cough on ETT with concern for vasovagal event leading to bradycardia and periarrest x 2, received chest compressions and ACLS medications prior to stabilization.   Patient remained intubated and was admitted to [facility] for management.  Pertinent Medical History:  ESRD on iHD T/T/S (recently self discontinued iHD in favor of homeopathic treatments), CHF, HTN, pulmonary hypertension, anemia, and arthritis  Significant Hospital Events: Including procedures, antibiotic start and stop dates in addition to other pertinent events   7/24 Presented for elective navigational bronch on awaking from anesthesia patient became brady and arrested x 2. Remained intubated post-procedure/arrest. Dialyzed overnight, 2L removed. Hypertensive requiring Cleviprex gtt initiation. 7/25 Labs significantly improved. Extubated, tolerated well.  Interim History / Subjective:  Hypertensive overnight requiring Cleviprex initiation No other significant events Underwent HD, 2L removed Extubated this AM to [place], tolerated well Oriented to self, date, location; unclear on what occurred 7/24  Objective:  Blood pressure (!) 206/92, pulse 67, temperature (!) 97.5 F (36.4 C), temperature source Axillary, resp. rate 16, height '6\' 2"'$  (1.88 m), weight 81.5 kg, SpO2  98 %.    Vent Mode: PRVC FiO2 (%):  [40 %-100 %] 40 % Set Rate:  [16 bmp] 16 bmp Vt Set:  [620 mL] 620 mL PEEP:  [5 cmH20] 5 cmH20 Plateau Pressure:  [16 cmH20-19 cmH20] 19 cmH20   Intake/Output Summary (Last 24 hours) at 08/04/2021 0746 Last data filed at 08/04/2021 0600 Gross per 24 hour  Intake 1178.24 ml  Output 2001 ml  Net -822.76 ml    Filed Weights   08/03/21 0935 08/03/21 2151 08/04/21 0500  Weight: 79.4 kg 82.2 kg 81.5 kg   Physical Examination: General: Overall well-appearing middle-aged man in NAD. Mildly confused immediately post-extubation. HEENT: [place]/AT, anicteric sclera, PERRL, moist mucous membranes. [place] in place. Neuro: Awake, oriented x 3. Responds to verbal stimuli. Following commands consistently. Moves all 4 extremities spontaneously. Strength 5/5 in all 4 extremities. CV: RRR, no m/g/r. PULM: Breathing even and unlabored on 4LNC. Lung fields CTAB post-extubation. GI: Soft, nontender, nondistended. Normoactive bowel sounds. Extremities: Trace symmetric bilateral LE edema noted. RUE AVF with +thrill. Skin: Warm/dry, no rashes.  Resolved Hospital Problem List:  Hyperkalemia - improved post-HD  Assessment & Plan:  S/p bradycardic arrest, suspect vasovagal component Peri-arrest x 2 in the immediate post-procedure period, suspect vasovagal component of bradycardia +/- severe hyperkalemia. Unclear if patient every truly lost pulses; 2 rounds of CPR, atropine/Epi. - Admitted to ICU for post-arrest monitoring - Goal MAP > 65 - Remains hypertensive, no need for vasopressors - Troponin x 1 sent, elevated 481 post-arrest/CPR - Echo pending   Acute hypoxemic respiratory failure Respiratory acidosis COPD, on HOT Former tobacco abuse, stopped 20 years ago Remained intubated post-procedure. Extubated 7/25AM. - Continue supplemental O2 support. - Wean O2 for sat > 90% - Pulmonary hygiene - Intermittent CXR - F/u ABG  Mediastinal mass S/p bronchoscopy 7/24.  Originally noted in 2020, evaluated at [facility] with bx negative for malignancy. CT Chest at that time c/f mass vs. esophageal duplication/bronchial cyst. Repeat CT at [facility] similar, slightly larger in size.  - S/p bronchoscopy 7/24 (Dr. Valeta Harms) - F/u pathology - Repeat imaging TBD   Diastolic CHF Echo 0/0174 with EF 50-55%, moderately increased LVH, G1DD. - HD per usual schedule for volume management - Cardiac monitoring - F/u Echo  AFib CHADS2VASC score of 3, on Eliquis. - Resume home Eliquis - Cardiac monitoring   ESRD on HD, noncompliant Patient with ESRD on HD, TTS schedule. Self-discontinued HD in favor of "homeopathic treatments", missed session 7/22. Listed for kidney transplant since 2019. - Nephrology following, appreciate assistance - Continue HD per TTS schedule, next today 7/25 - Trend BMP - Replete electrolytes as indicated - Monitor I&Os - Avoid nephrotoxic agents as able - Briefly discussed the importance of compliance with HD sessions and the risk of similar instances of cardiac arrest if he chooses not to receive dialysis.  Best Practice (right click and "Reselect all SmartList Selections" daily)   Diet/type: Regular consistency (see orders) DVT prophylaxis: prophylactic heparin  GI prophylaxis: PPI Lines: N/A Foley:  N/A Code Status:  full code Last date of multidisciplinary goals of care discussion: Daughter updated post arrest, left VM 7/25 with update.  Critical care time: 38 minutes   The patient is critically ill with multiple organ system failure and requires high complexity decision making for assessment and support, frequent evaluation and titration of therapies, advanced monitoring, review of radiographic studies and interpretation of complex data.   Critical Care Time devoted to patient care services, exclusive of separately billable procedures, described in this note is 38 minutes.  Lestine Mount, PA-C [facility] 08/04/21 7:49 AM  Please see [email or web address] for pager details.  From 7A-7P if no response, please call [phone] After hours, please call ELink [phone]

## 2021-08-04 NOTE — Evaluation (Signed)
Physical Therapy Evaluation Patient Details Name: Jason [name]. MRN: [id number] DOB: Mar 15, 1960 Today's Date: 08/04/2021  History of Present Illness  Pt is a 61 y.o. male presenting 08/03/21 for elective diagnostic bronchoscopy due to mediastinal mass seen in ER; course complicated by post-op cardiac arrest when extubated requiring chest compressions for ~3-min. ETT 7/24-7/25. PMH Includes ESRD (recently self-discontinued iHD in favor of homeopathic tx), HTN, PAH, HFpEF, COPD (wears 2-3L O2 baseline), L knee arthritis.   Clinical Impression  Pt presents with an overall decrease in functional mobility secondary to above. PTA, pt mod indep with rollator, most recently living in hotel but reports on waitlist for new apartment. Initiated educ re: precautions, positioning, pulmonary hygiene, activity recommendations and importance of mobility. Today, pt able to perform brief bouts of standing activity with RW and min guard; mobility limited by c/o chest pain and significant productive cough with upright movement. Pt would benefit from continued acute PT services to maximize functional mobility and independence prior to d/c home.     SpO2 down to 85% on 6L O2 HFNC with standing activity; HR 80s    Recommendations for follow up therapy are one component of a multi-disciplinary discharge planning process, led by the attending physician.  Recommendations may be updated based on patient status, additional functional criteria and insurance authorization.  Follow Up Recommendations No PT follow up      Assistance Recommended at Discharge PRN  Patient can return home with the following  Assistance with cooking/housework;Assist for transportation    Equipment Recommendations None recommended by PT  Recommendations for Other Services       Functional Status Assessment Patient has had a recent decline in their functional status and demonstrates the ability to make significant improvements in function  in a reasonable and predictable amount of time.     Precautions / Restrictions Precautions Precautions: Fall;Other (comment) Precaution Comments: Watch SpO2 (wears 2-3L O2 baseline) Restrictions Weight Bearing Restrictions: No      Mobility  Bed Mobility Overal bed mobility: Modified Independent             General bed mobility comments: increased time, HOB elevated; return to supine with bed flat    Transfers Overall transfer level: Needs assistance Equipment used: Rolling walker (2 wheels) Transfers: Sit to/from Stand Sit to Stand: Min guard           General transfer comment: initial attempt to stand without DME but pt asking for walker; reliant on momentum to power up, min guard for balance    Ambulation/Gait Ambulation/Gait assistance: Min guard Gait Distance (Feet): 4 Feet Assistive device: Rolling walker (2 wheels) Gait Pattern/deviations: Step-to pattern, Antalgic, Trunk flexed, Decreased weight shift to left Gait velocity: Decreased     General Gait Details: pt taking a few steps forwards/backwards and side steps towards HOB with RW and min guard; antalgic gait with decreased WB through LLE due to knee pain; increased coughing and secretions with standing requiring return to sit  Stairs            Wheelchair Mobility    Modified Rankin (Stroke Patients Only)       Balance Overall balance assessment: Needs assistance   Sitting balance-Leahy Scale: Good Sitting balance - Comments: prolonged sitting EOB due to increased coughing and secretions   Standing balance support: Bilateral upper extremity supported, During functional activity Standing balance-Leahy Scale: Poor Standing balance comment: use of RW to offload painful LLE; suspect pt could maintain balance without UE support  Pertinent Vitals/Pain Pain Assessment Pain Assessment: Faces Faces Pain Scale: Hurts even more Pain Location: chest,  throat Pain Descriptors / Indicators: Discomfort, Grimacing, Guarding Pain Intervention(s): Monitored during session, Other (comment) (educ on abdominal precautions/splinting for comfort)    Home Living Family/patient expects to be discharged to:: Other (Comment) (hotel) Living Arrangements: Alone Available Help at Discharge: Family;Available PRN/intermittently             Home Equipment: Rollator (4 wheels) Additional Comments: has been staying at a hotel, reports being on waitlist for new apartment    Prior Function Prior Level of Function : Independent/Modified Independent             Mobility Comments: reports using rollator since last admission (04/2021)       Hand Dominance        Extremity/Trunk Assessment   Upper Extremity Assessment Upper Extremity Assessment: Overall WFL for tasks assessed    Lower Extremity Assessment Lower Extremity Assessment: Generalized weakness;LLE deficits/detail LLE Deficits / Details: h/o L knee arthritis, noted swelling    Cervical / Trunk Assessment Cervical / Trunk Assessment: Other exceptions Cervical / Trunk Exceptions: forward flexed, pain post-chest compressions  Communication   Communication: No difficulties  Cognition Arousal/Alertness: Awake/alert Behavior During Therapy: WFL for tasks assessed/performed Overall Cognitive Status: Within Functional Limits for tasks assessed                                          General Comments General comments (skin integrity, edema, etc.): SpO2 down to 85% on 6L O2 HFNC with standing activity, HR 80s. Educ re: abdominal/sternal precautions for comfort, positioning, pulmonary hygiene, activity recommendations, importance of mobility    Exercises Other Exercises Other Exercises: incentive spirometer x8 (pt pulling ~738m with good technique)   Assessment/Plan    PT Assessment Patient needs continued PT services  PT Problem List Decreased activity  tolerance;Decreased balance;Decreased mobility;Cardiopulmonary status limiting activity;Pain       PT Treatment Interventions DME instruction;Stair training;Gait training;Functional mobility training;Therapeutic activities;Therapeutic exercise;Balance training;Patient/family education    PT Goals (Current goals can be found in the Care Plan section)  Acute Rehab PT Goals Patient Stated Goal: decreased pain; get new apartment PT Goal Formulation: With patient Time For Goal Achievement: 08/18/21 Potential to Achieve Goals: Good    Frequency Min 3X/week     Co-evaluation               AM-PAC PT "6 Clicks" Mobility  Outcome Measure Help needed turning from your back to your side while in a flat bed without using bedrails?: None Help needed moving from lying on your back to sitting on the side of a flat bed without using bedrails?: None Help needed moving to and from a bed to a chair (including a wheelchair)?: A Little Help needed standing up from a chair using your arms (e.g., wheelchair or bedside chair)?: A Little Help needed to walk in hospital room?: A Little Help needed climbing 3-5 steps with a railing? : A Little 6 Click Score: 20    End of Session Equipment Utilized During Treatment: Oxygen Activity Tolerance: Patient tolerated treatment well;Patient limited by pain Patient left: in bed;with call bell/phone within reach Nurse Communication: Mobility status PT Visit Diagnosis: Other abnormalities of gait and mobility (R26.89);Pain    Time: 19892-1194PT Time Calculation (min) (ACUTE ONLY): 22 min   Charges:   PT Evaluation $PT  Eval Moderate Complexity: 1 Mod        Mabeline Caras, PT, DPT Acute Rehabilitation Services  Personal: Needles Rehab Office: [facility] 08/04/2021, 1:27 PM

## 2021-08-04 NOTE — Progress Notes (Signed)
eLink Physician-Brief Progress Note Patient Name: Edgel Degnan. DOB: 1960-03-14 MRN: [id number]   Date of Service  08/04/2021  HPI/Events of Note  Patient c/o pain related to CPR/arthritis and rates it 6/10. ALT, AST and Creatinine all elevated, therefore, will avoid Tylenol and NSAID.  eICU Interventions  Plan: Fentanyl 25-50 mcg IV Q 2 hours PRN moderate or severe pain.      Intervention Category Major Interventions: Other:  [place], [place] 08/04/2021, 11:00 PM

## 2021-08-04 NOTE — Anesthesia Postprocedure Evaluation (Addendum)
Anesthesia Post Note  Patient: Jason [name].  Procedure(s) Performed: ROBOTIC ASSISTED NAVIGATIONAL BRONCHOSCOPY (Bilateral) VIDEO BRONCHOSCOPY WITH ENDOBRONCHIAL ULTRASOUND (Bilateral) BRONCHIAL NEEDLE ASPIRATION BIOPSIES BRONCHIAL BIOPSIES FINE NEEDLE ASPIRATION (FNA) LINEAR     Patient location during evaluation: ICU Anesthesia Type: General Level of consciousness: sedated and patient remains intubated per anesthesia plan Pain management: pain level controlled Vital Signs Assessment: post-procedure vital signs reviewed and stable Respiratory status: patient remains intubated per anesthesia plan Cardiovascular status: blood pressure returned to baseline and stable Postop Assessment: no apparent nausea or vomiting Anesthetic complications: no Comments: Please see intraoperative notes. In summary, upon emergence, pt with inadequate ventilation. BP and HR decreased requiring CPR. Medications administered as per chart. ROSC in 4 minutes. Labs revealed K 7.0. Arrest likely 2/2 hyperkalemia combined with hypercarbia/acidemia related to emergence.    No notable events documented.  Last Vitals:  Vitals:   08/04/21 0853 08/04/21 0900  BP: (!) 157/77 (!) 163/80  Pulse: 70 76  Resp: 14 19  Temp:    SpO2: 97% 92%    Last Pain:  Vitals:   08/04/21 0747  TempSrc: Oral  PainSc:                  Sara Keys L Parmvir Boomer

## 2021-08-04 NOTE — Addendum Note (Signed)
Addendum  created 08/04/21 1017 by Freddrick March, MD   Clinical Note Signed

## 2021-08-04 NOTE — Progress Notes (Signed)
[facility] Progress Note  Subjective: seen in ICU, extubated and alert and Ox 3. Remembers getting HD last night. No c/o today.   Vitals:   08/04/21 0755 08/04/21 0852 08/04/21 0853 08/04/21 0900  BP: (!) 161/73  (!) 157/77 (!) 163/80  Pulse: 68 69 70 76  Resp: '16 16 14 19  '$ Temp:      TempSrc:      SpO2: 100% 97% 97% 92%  Weight:      Height:        Exam:  alert, nad, nasal O2  no jvd  Chest cta bilat  Cor reg no RG  Abd soft ntnd no ascites   Ext 1+ LE edema   Alert, NF, ox3    RUA AVF+ aneurysmal, +bruit    Home meds include - apixaban, carvedilol, trelegy ellipta, renavit, home O2, prns/ vits/ supps    [facility]  4h 41mn   400/800   76kjg  2/2 bath   RUA AVF   Hep 6000 - last HD 7/13, post wt 76.3kg  - last Hb 9.7, tsat 22% - mircera 100 q 2wks, last 7/5, due 7/19 (missed) - venofer 100 mg q hd thru 8/05 - doxercalciferol 7 ug tiw IV        CXR - no edema     Assessment/ Plan: Cardiac arrest - brief w/ 2-3 min CPR post procedure 7/24. Stable today SP bronchoscopy - done 7/24 Hyperkalemia - K+ was 7.3, down to 3.7 this am. Resolved.  ESRD - on HD TTS. Has missed last 4 HD sessions. HD again today to get back on schedule.  BP/ volume - Bp's on the lower side, getting coreg low dose bid. Is 5kg up, max UF w/ HD today.  Anemia esrd - Hb 9s, will give esa w/ HD today. Cont IV fe load.  MBD ckd - CCa in range, phos a bit high. Cont IV vdra.        Rob Chason Mciver 08/04/2021, 9:29 AM   Recent Labs  Lab 08/03/21 1556 08/03/21 1612 08/04/21 0030  HGB 8.2* 8.2* 8.4*  ALBUMIN 2.7*  --  2.7*  CALCIUM 8.1*  --  8.0*  PHOS >30.0*  --  6.5*  CREATININE 16.02*  --  8.57*  K 6.2* 5.8* 3.7   No results for input(s): "IRON", "TIBC", "FERRITIN" in the last 168 hours. Inpatient medications:  calcium gluconate  1 g Intravenous Once   carvedilol  6.25 mg Oral BID WC   Chlorhexidine Gluconate Cloth  6 each Topical Q0600   darbepoetin (ARANESP)  injection - DIALYSIS  100 mcg Intravenous Q Tue-HD   docusate  100 mg Per Tube BID   heparin  5,000 Units Subcutaneous Q8H   insulin aspart  10 Units Intravenous Once   mouth rinse  15 mL Mouth Rinse Q2H   pantoprazole sodium  40 mg Per Tube Daily   polyethylene glycol  17 g Per Tube Daily    clevidipine Stopped (08/04/21 0856)   ferric gluconate (FERRLECIT) IVPB     propofol (DIPRIVAN) infusion Stopped (08/04/21 0826)   fentaNYL (SUBLIMAZE) injection, fentaNYL (SUBLIMAZE) injection, hydrALAZINE, mouth rinse

## 2021-08-04 NOTE — Progress Notes (Signed)
Received patient in bed, alert and oriented. Informed consent signed and in chart.  Time tx completed:3.5 hrs  HD treatment completed. Patient tolerated well. Fistula without signs and symptoms of complications. Patient treated at bedside. Alert and orient and in no acute distress. Report given to bedside RN.  Total UF removed:3500 mls  Medication given: Aranesp 100 mcg, Iron 125 mg  Post HD VS:See vitals  Post HD weight: 73.0 kg  Pt stable.  Alert and oriented x4.

## 2021-08-04 NOTE — Inpatient Diabetes Management (Signed)
Inpatient Diabetes Program Recommendations  AACE/ADA: New Consensus Statement on Inpatient Glycemic Control (2015)  Target Ranges:  Prepandial:   less than 140 mg/dL      Peak postprandial:   less than 180 mg/dL (1-2 hours)      Critically ill patients:  140 - 180 mg/dL   Lab Results  Component Value Date   GLUCAP 76 08/04/2021   HGBA1C 4.9 01/17/2017    Review of Glycemic Control  Latest Reference Range & Units 08/03/21 23:31 08/04/21 00:24 08/04/21 03:45 08/04/21 04:17 08/04/21 07:53  Glucose-Capillary 70 - 99 mg/dL 66 (L) 98 65 (L) 99 76  (L): Data is abnormally low Diabetes history: No DM noted Outpatient Diabetes medications: none Current orders for Inpatient glycemic control: Novolog 10 units x 1  Inpatient Diabetes Program Recommendations:    Noted hypoglycemia of 58 mg/dL following Novolog 10 units x 1 treatment for hyperkalemia. May want to continue checking CBGs Q4H.   Thanks, Bronson Curb, MSN, RNC-OB Diabetes Coordinator [phone] (8a-5p)

## 2021-08-04 NOTE — Procedures (Signed)
Extubation Procedure Note  Patient Details:   Name: Jason [name]. DOB: September 19, 1960 MRN: [id number]   Airway Documentation:    Vent end date: 08/04/21 Vent end time: 0857   Evaluation  O2 sats: stable throughout Complications: No apparent complications Patient did tolerate procedure well. Bilateral Breath Sounds: Clear, Diminished   Yes  Positive cuff leak noted. Patient placed on [place] 4L with humidity, no stridor noted.  Mingo Amber [name] 08/04/2021, 9:05 AM

## 2021-08-04 NOTE — TOC Initial Note (Addendum)
Transition of Care (TOC) - Initial/Assessment Note    Patient Details  Name: Jason [name]. MRN: [id number] Date of Birth: October 20, 1960  Transition of Care [facility]) CM/SW Contact:    Bethena Roys, RN Phone Number: 08/04/2021, 3:24 PM  Clinical Narrative: Risk for readmission assessment completed. Case Manager spoke with patient regarding disposition needs. Patient states he is currently living in a Motel and has issues with transportation to HD. Patient states he has a PCP- CM educated the patient on being present at PCP visits. Patient states he is on the waiting list for housing and is currently living in a motel at this time. Patient reports that he has transportation issues getting to HD appointment. Patient has Medicaid and the CSW will provide Medicaid transportation resources to the patient. Case Manager will continue to follow for additional transition of care needs.                Expected Discharge Plan: Home/Self Care Barriers to Discharge: Continued Medical Work up   Patient Goals and CMS Choice Patient states their goals for this hospitalization and ongoing recovery are:: to return to hotel      Expected Discharge Plan and Services Expected Discharge Plan: Home/Self Care In-house Referral: Clinical Social Work Discharge Planning Services: CM Consult   Living arrangements for the past 2 months: Hotel/Motel Expected Discharge Date: 08/03/21               DME Arranged: N/A     Prior Living Arrangements/Services Living arrangements for the past 2 months: Hotel/Motel Lives with:: Self Patient language and need for interpreter reviewed:: Yes Do you feel safe going back to the place where you live?: Yes      Need for Family Participation in Patient Care: No (Comment) Care giver support system in place?: No (comment)   Criminal Activity/Legal Involvement Pertinent to Current Situation/Hospitalization: No - Comment as needed  Permission  Sought/Granted Permission sought to share information with : Case Manager      Emotional Assessment Appearance:: Appears stated age Attitude/Demeanor/Rapport: Engaged Affect (typically observed): Appropriate Orientation: : Oriented to Situation, Oriented to Place, Oriented to  Time, Oriented to Self Alcohol / Substance Use: Not Applicable Psych Involvement: No (comment)  Admission diagnosis:  Cardiac arrest [facility]) [I46.9] Patient Active Problem List   Diagnosis Date Noted   Cardiac arrest ([place]) 08/03/2021   Lung nodules 07/28/2021   Acute on chronic respiratory failure with hypoxia ([place]) 05/11/2021   Homelessness 05/09/2021   Non-compliance with renal dialysis 05/09/2021   Left knee pain 05/09/2021   Abnormal CXR 05/09/2021   Hypoxia 02/09/2021   Infection of left olecranon bursa 02/09/2021   Low back pain 02/09/2021   Hyperkalemia, diminished renal excretion 07/17/2020   Acute on chronic diastolic CHF (congestive heart failure) ([place]) 07/17/2020   Encephalopathy 10/30/2019   Mediastinal mass 05/21/2019   Hyperkalemia 04/26/2019   Chronic diastolic CHF (congestive heart failure) ([place]) 04/26/2019   Hypertensive urgency 02/17/2019   Hypertensive emergency 02/17/2019   Leg edema, left 02/17/2019   Bilateral impacted cerumen 08/24/2018   History of elevated PSA 08/24/2018   Hemodialysis patient ([place]) 05/28/2018   Vitamin D deficiency 05/28/2018   Left inguinal hernia s/p lap repair w mesh 11/18/2017 11/18/2017   Scrotal hernia, right, s/p lap repair w mesh 11/18/2017 05/30/2017   Arteriovenous fistula (Right arm) for dialysis 97/67/3419   Umbilical hernia s/p primary repair 11/18/2017 05/30/2017   Anasarca 02/18/2017   ESRD (end stage renal disease) on dialysis (  [place])    NSVT (nonsustained ventricular tachycardia) ([place])    Evaluation by psychiatric service required 01/19/2017   Elevated troponin 01/16/2017   Acute CHF (congestive heart failure) ([place]) 01/16/2017   Venous stasis  ulcer ([place]) 01/16/2017   Essential hypertension 02/26/2015   Abnormal EKG 01/30/2015   Anemia of chronic disease 01/29/2015   PCP:  [name], FNP Pharmacy:   [place], [place] - [address] [place] [address] Phone: [phone] Fax: [phone]  [facility] [place], [place] - [address] [place], [place] [address] Phone: [phone] Fax: [phone]    Readmission Risk Interventions    08/04/2020    4:32 PM 04/30/2019    1:19 PM  Readmission Risk Prevention Plan  Transportation Screening Complete Complete  PCP or Specialist Appt within 3-5 Days  Complete  HRI or Home Care Consult Complete Complete  Social Work Consult for [place] Planning/Counseling Complete Complete  Palliative Care Screening Not Applicable Not Applicable  Medication Review Press photographer) Complete Complete

## 2021-08-05 ENCOUNTER — Telehealth: Payer: Self-pay | Admitting: Pulmonary Disease

## 2021-08-05 ENCOUNTER — Telehealth (HOSPITAL_COMMUNITY): Payer: Self-pay | Admitting: Pharmacy Technician

## 2021-08-05 ENCOUNTER — Other Ambulatory Visit (HOSPITAL_COMMUNITY): Payer: Self-pay

## 2021-08-05 DIAGNOSIS — I5032 Chronic diastolic (congestive) heart failure: Secondary | ICD-10-CM | POA: Diagnosis not present

## 2021-08-05 DIAGNOSIS — N186 End stage renal disease: Secondary | ICD-10-CM

## 2021-08-05 DIAGNOSIS — I469 Cardiac arrest, cause unspecified: Secondary | ICD-10-CM

## 2021-08-05 DIAGNOSIS — Z91158 Patient's noncompliance with renal dialysis for other reason: Secondary | ICD-10-CM

## 2021-08-05 DIAGNOSIS — I10 Essential (primary) hypertension: Secondary | ICD-10-CM

## 2021-08-05 DIAGNOSIS — R918 Other nonspecific abnormal finding of lung field: Secondary | ICD-10-CM | POA: Diagnosis not present

## 2021-08-05 DIAGNOSIS — Z992 Dependence on renal dialysis: Secondary | ICD-10-CM

## 2021-08-05 LAB — RENAL FUNCTION PANEL
Albumin: 2.7 g/dL — ABNORMAL LOW (ref 3.5–5.0)
Anion gap: 12 (ref 5–15)
BUN: 44 mg/dL — ABNORMAL HIGH (ref 6–20)
CO2: 25 mmol/L (ref 22–32)
Calcium: 7.8 mg/dL — ABNORMAL LOW (ref 8.9–10.3)
Chloride: 93 mmol/L — ABNORMAL LOW (ref 98–111)
Creatinine, Ser: 6.64 mg/dL — ABNORMAL HIGH (ref 0.61–1.24)
GFR, Estimated: 9 mL/min — ABNORMAL LOW (ref >60)
Glucose, Bld: 107 mg/dL — ABNORMAL HIGH (ref 70–99)
Phosphorus: 7.9 mg/dL — ABNORMAL HIGH (ref 2.5–4.6)
Potassium: 4.3 mmol/L (ref 3.5–5.1)
Sodium: 130 mmol/L — ABNORMAL LOW (ref 135–145)

## 2021-08-05 LAB — MRSA NEXT GEN BY PCR, NASAL: MRSA by PCR Next Gen: NOT DETECTED

## 2021-08-05 LAB — CBC
HCT: 27.3 % — ABNORMAL LOW (ref 39.0–52.0)
Hemoglobin: 9.3 g/dL — ABNORMAL LOW (ref 13.0–17.0)
MCH: 31.3 pg (ref 26.0–34.0)
MCHC: 34.1 g/dL (ref 30.0–36.0)
MCV: 91.9 fL (ref 80.0–100.0)
Platelets: 253 10*3/uL (ref 150–400)
RBC: 2.97 MIL/uL — ABNORMAL LOW (ref 4.22–5.81)
RDW: 15.4 % (ref 11.5–15.5)
WBC: 6.8 10*3/uL (ref 4.0–10.5)
nRBC: 0 % (ref 0.0–0.2)

## 2021-08-05 LAB — HEPATITIS B SURFACE ANTIBODY, QUANTITATIVE: Hep B S AB Quant (Post): <3.1 m[IU]/mL — ABNORMAL LOW (ref >9.9)

## 2021-08-05 MED ORDER — ALBUTEROL SULFATE HFA 108 (90 BASE) MCG/ACT IN AERS
2.0000 | INHALATION_SPRAY | RESPIRATORY_TRACT | Status: DC | PRN
Start: 2021-08-05 — End: 2021-08-05

## 2021-08-05 MED ORDER — ONDANSETRON HCL 4 MG/2ML IJ SOLN: 4.0000 mg | Freq: Four times a day (QID) | INTRAMUSCULAR | Status: DC | PRN

## 2021-08-05 MED ORDER — CHLORHEXIDINE GLUCONATE CLOTH 2 % EX PADS
6.0000 | MEDICATED_PAD | Freq: Every day | CUTANEOUS | Status: DC
  Administered 2021-08-05 – 2021-08-07 (×3): 6 via TOPICAL

## 2021-08-05 MED ORDER — HEPARIN SODIUM (PORCINE) 1000 UNIT/ML DIALYSIS: 3000.0000 [IU] | INTRAMUSCULAR | Status: DC | PRN

## 2021-08-05 MED ORDER — ALBUTEROL SULFATE (2.5 MG/3ML) 0.083% IN NEBU
2.5000 mg | INHALATION_SOLUTION | RESPIRATORY_TRACT | Status: DC | PRN
  Administered 2021-08-05 – 2021-08-07 (×5): 2.5 mg via RESPIRATORY_TRACT
  Filled 2021-08-05 (×5): qty 3

## 2021-08-05 MED ORDER — APIXABAN 5 MG PO TABS: 5.0000 mg | ORAL_TABLET | Freq: Two times a day (BID) | ORAL | Status: DC

## 2021-08-05 MED ORDER — ORAL CARE MOUTH RINSE: 15.0000 mL | OROMUCOSAL | Status: DC | PRN

## 2021-08-05 NOTE — Progress Notes (Signed)
Daughter called unit, this RN updated her of impending transfer and new room number.

## 2021-08-05 NOTE — TOC Benefit Eligibility Note (Signed)
Patient Teacher, English as a foreign language completed.    The patient is currently admitted and upon discharge could be taking Eliquis 5 mg.  The current 30 day co-pay is $10.35.   The patient is insured through [place], [place] Patient Advocate Specialist [place] Patient Advocate Team Direct Number: [phone]  Fax: [phone]

## 2021-08-05 NOTE — Telephone Encounter (Addendum)
Order cancelled per hospital.

## 2021-08-05 NOTE — Progress Notes (Signed)
Physical Therapy Treatment Patient Details Name: Jason [name]. MRN: [id number] DOB: 15-Dec-1960 Today's Date: 08/05/2021   History of Present Illness Pt is a 61 y.o. male presenting 08/03/21 for elective diagnostic bronchoscopy due to mediastinal mass seen in ER; course complicated by post-op cardiac arrest when extubated requiring chest compressions for ~3-min. ETT 7/24-7/25. PMH Includes ESRD (recently self-discontinued iHD in favor of homeopathic tx), HTN, PAH, HFpEF, COPD (wears 2-3L O2 baseline), L knee arthritis.   PT Comments    Pt progressing with mobility. Pt ambulatory at supervision-level; demonstrates good strength, but limited by painful productive cough and significant SOB. Pt reports has not ambulated or sat up in recliner yet today; education on activity recommendations and importance of increased mobility. Will continue to follow acutely to address established goals.  SpO2 90% on 4L O2 [place] with ambulation, but difficulty getting reliable pleth HR 70s    Recommendations for follow up therapy are one component of a multi-disciplinary discharge planning process, led by the attending physician.  Recommendations may be updated based on patient status, additional functional criteria and insurance authorization.  Follow Up Recommendations  No PT follow up     Assistance Recommended at Discharge PRN  Patient can return home with the following Assistance with cooking/housework;Assist for transportation   Equipment Recommendations  None recommended by PT    Recommendations for Other Services       Precautions / Restrictions Precautions Precautions: Fall;Other (comment) Precaution Comments: Watch SpO2 (wears 2-3L O2 baseline) Restrictions Weight Bearing Restrictions: No     Mobility  Bed Mobility Overal bed mobility: Modified Independent             General bed mobility comments: increased time to recover c/o SOB once sitting EOB, increased secretions wanting to  use yaunker for suction frequently    Transfers Overall transfer level: Modified independent Equipment used: None, Rollator (4 wheels)                    Ambulation/Gait Ambulation/Gait assistance: Supervision Gait Distance (Feet): 210 Feet (+ 20') Assistive device: Rollator (4 wheels), None   Gait velocity: Decreased     General Gait Details: hallway ambulation with rollator and supervision for safety/lines, pt with productive cough then realizing he does not have yaunker in hallway (although warned of this and brought spit cup for pt), pt rushing back to room to get to yaunker and catch breathing; required prolonged seated rest break to recover, pt requesting increased supplemental O2, educ on pursed lip and deep breathing breathing; then ambulating to bathroom without DME, good stability   Stairs             Wheelchair Mobility    Modified Rankin (Stroke Patients Only)       Balance Overall balance assessment: Needs assistance Sitting-balance support: No upper extremity supported, Feet supported Sitting balance-Leahy Scale: Good     Standing balance support: No upper extremity supported, During functional activity Standing balance-Leahy Scale: Fair Standing balance comment: ambulatory without DME                            Cognition Arousal/Alertness: Awake/alert Behavior During Therapy: WFL for tasks assessed/performed Overall Cognitive Status: No family/caregiver present to determine baseline cognitive functioning                                 General Comments: [facility]  for simple tasks; minimal conversation, difficult to determine desire to incorporate education/suggestions        Exercises      General Comments General comments (skin integrity, edema, etc.): educ pt on importance of deep breathing and strong cough, as pt very attached to yaunker suction use though not necessarily needing it for secretions; reports  increased pain with cough. pt has not mobilized out of room and no recliner in room; found recliner for pt to sit in after using bathroom, encouraged pt and RN to ambulate more frequently with pt      Pertinent Vitals/Pain Pain Assessment Pain Assessment: Faces Faces Pain Scale: Hurts little more Pain Location: chest with coughing Pain Descriptors / Indicators: Discomfort, Grimacing, Guarding Pain Intervention(s): Monitored during session, Limited activity within patient's tolerance    Home Living                          Prior Function            PT Goals (current goals can now be found in the care plan section) Progress towards PT goals: Progressing toward goals    Frequency    Min 3X/week      PT Plan Current plan remains appropriate    Co-evaluation              AM-PAC PT "6 Clicks" Mobility   Outcome Measure  Help needed turning from your back to your side while in a flat bed without using bedrails?: None Help needed moving from lying on your back to sitting on the side of a flat bed without using bedrails?: None Help needed moving to and from a bed to a chair (including a wheelchair)?: None Help needed standing up from a chair using your arms (e.g., wheelchair or bedside chair)?: None Help needed to walk in hospital room?: A Little Help needed climbing 3-5 steps with a railing? : A Little 6 Click Score: 22    End of Session Equipment Utilized During Treatment: Oxygen Activity Tolerance: Patient tolerated treatment well;Patient limited by pain Patient left: with call bell/phone within reach (in bathroom) Nurse Communication: Mobility status;Other (comment) (RN and NT notified pt needing increased time to use bathroom, pt aware to pull call cord when done) PT Visit Diagnosis: Other abnormalities of gait and mobility (R26.89);Pain     Time: 3903-0092 PT Time Calculation (min) (ACUTE ONLY): 29 min  Charges:  $Gait Training: 8-22  mins $Therapeutic Activity: 8-22 mins                     Mabeline Caras, PT, DPT Acute Rehabilitation Services  Personal: [facility] Office: [place] 08/05/2021, 4:48 PM

## 2021-08-05 NOTE — Progress Notes (Signed)
CSW received consult from CM, resources for transportation to HD for patient. CSW spoke with patient at bedside. CSW provided patient with transportation resources. Patient informed CSW will call Medicaid to set up transportation to and from HD. All questions answered. No further questions reported at this time.

## 2021-08-05 NOTE — Telephone Encounter (Signed)
Pharmacy Patient Advocate Encounter  Insurance verification completed.    The patient is insured through [employer] Part D   The patient is currently admitted and ran test claims for the following: Eliquis .  Copays and coinsurance results were relayed to Inpatient clinical team.      

## 2021-08-05 NOTE — Progress Notes (Signed)
[facility] Progress Note  Subjective: seen in room, in good spirits. Has orders to move to the floor. Standing wt 80.5kg this am.   Vitals:   08/05/21 0700 08/05/21 0725 08/05/21 0800 08/05/21 0900  BP: (!) 130/93  126/73 105/75  Pulse: 83  90 83  Resp: (!) 22  (!) 21 (!) 23  Temp:  98.2 F (36.8 C)    TempSrc:  Oral    SpO2: 94%  93% 92%  Weight:      Height:        Exam:  alert, nad, nasal O2  no jvd  Chest cta bilat  Cor reg no RG  Abd soft ntnd no ascites   Ext 1+ LE edema   Alert, NF, ox3    RUA AVF+bruit    Home meds include - apixaban, carvedilol, trelegy ellipta, renavit, home O2, prns/ vits/ supps  CXR 7/24 - IMPRESSION: 1. Interval intubation with tip of endotracheal tube about 5.5 cm superior to carina  2. Negative for pneumothorax  3. Stable cardiomediastinal silhouette with mediastinal mass. Left suprahilar lung mass is better seen on CT.     [facility] HD: [facility]  4h 55mn   400/800   76kjg  2/2 bath   RUA AVF   Hep 6000 - last HD 7/13, post wt 76.3kg  - last Hb 9.7, tsat 22% - mircera 100 q 2wks, last 7/5, due 7/19 (missed) - venofer 100 mg q hd thru 8/05 - doxercalciferol 7 ug tiw IV        CXR - no edema     Assessment/ Plan: Cardiac arrest - brief w/ 2-3 min CPR post procedure 7/24. Stable today, has orders to move to a floor bed.  AHRF - HFNC down from 6 L to 3 L today. On home O2 2-3 L .  SP bronchoscopy - done 7/24 Hyperkalemia - resolved ESRD - on HD TTS. Had missed last 4 OP HD sessions. Has had HD here twice on Monday and Tuesday. Next HD tomorrow.  BP/ volume - Bp's well controlled, getting coreg low dose bid. Is 3-4kg up still per standing wt today.  Anemia esrd - Hb 9s, esa was due so we started darbe 100 ug weekly, 1st dose was 7/24. Cont IV fe load thru 8/3.  MBD ckd - CCa in range, phos slightly up. Cont IV vdra.     Rob Shabre Kreher 08/05/2021, 9:31 AM   Recent Labs  Lab 08/04/21 0030 08/04/21 0914 08/05/21 0337   HGB 8.4*  --  9.3*  ALBUMIN 2.7*  --  2.7*  CALCIUM 8.0*  --  7.8*  PHOS 6.5*  --  7.9*  CREATININE 8.57*  --  6.64*  K 3.7 4.5 4.3    No results for input(s): "IRON", "TIBC", "FERRITIN" in the last 168 hours. Inpatient medications:  amLODipine  10 mg Oral QHS   apixaban  5 mg Oral BID   carvedilol  6.25 mg Oral BID WC   Chlorhexidine Gluconate Cloth  6 each Topical Q0600   darbepoetin (ARANESP) injection - DIALYSIS  100 mcg Intravenous Q Tue-HD   docusate sodium  100 mg Oral BID   insulin aspart  10 Units Intravenous Once   pantoprazole  40 mg Oral Daily   polyethylene glycol  17 g Oral Daily    ferric gluconate (FERRLECIT) IVPB Stopped (08/04/21 1549)   fentaNYL (SUBLIMAZE) injection, hydrALAZINE, mouth rinse

## 2021-08-05 NOTE — Progress Notes (Addendum)
PROGRESS NOTE    Jason [name].  TDV:[id number] DOB: [birth year] DOA: 08/03/2021 PCP: [name], FNP    No chief complaint on file.   Brief Narrative:   61 year old man who presented to [facility] 7/24 for elective navigational bronchoscopy for evaluation of lung nodule. Subsequently experienced bradycardic arrest post-procedure. PMHx significant for ESRD on iHD T/T/S (recently self discontinued iHD in favor of homeopathic treatments, Afib (on Eliquis), CHF, HTN, pulmonary hypertension, anemia, and arthritis.   Initially patient tolerated procedure well but upon waking from anesthesia patient began to cough on ETT with concern for vasovagal event leading to bradycardia and periarrest x 2, received chest compressions and ACLS medications prior to stabilization.    Patient remained intubated and was admitted to ICU, were consulted where patient was dialyzed, he was successfully extubated, and transferred to [facility] service 7/26.  Assessment & Plan:   Principal Problem:   Lung nodules Active Problems:   Non-compliance with renal dialysis   ESRD (end stage renal disease) on dialysis [facility])   Essential hypertension   Chronic diastolic CHF (congestive heart failure) (HCC)   Cardiac arrest (HCC)   S/p bradycardic arrest, suspect vasovagal component - Peri-arrest x 2 in the immediate post-procedure period, suspect vasovagal component of bradycardia +/- severe hyperkalemia. Unclear if patient every truly lost pulses; 2 rounds of CPR, atropine/Epi. -Patient was intubated for airway protection. -Admitted to ICU, currently stabilized, continue A-line and transfer out of ICU -Hypertensive, did not require any vasopressors - Admitted to ICU for post-arrest monitoring - Remains hypertensive, no need for vasopressors - Troponin x 1 sent, elevated 481 post-arrest/CPR -2D echo with a preserved EF, but significant for severely elevated pulmonary artery systolic pressure.   Acute hypoxemic  respiratory failure Respiratory acidosis COPD, on HOT Former tobacco abuse, stopped 20 years ago -Since remained intubated postprocedure(bronchoscopy) married to ICU, extubated 7/25AM. - Continue supplemental O2 support. - Wean O2 for sat > 90% - Pulmonary hygiene - Intermittent CXR -Wean oxygen as tolerated.   Mediastinal mass S/p bronchoscopy 7/24. Originally noted in 2020, evaluated at [facility] with bx negative for malignancy. CT Chest at that time c/f mass vs. esophageal duplication/bronchial cyst. Repeat CT at [facility] similar, slightly larger in size.  - S/p bronchoscopy 7/24 (Dr. Valeta Harms) - F/u pathology - Repeat imaging TBD  Hypertension -Blood pressure on the lower side today, will discontinue Norvasc, continue with Coreg.   Diastolic CHF - Echo 0/6269 with EF 50-55%, moderately increased LVH, G1DD.  Repeat echo this admission with EF 48%, grade 1 diastolic dysfunction. -Volume management with HD.   AFib CHADS2VASC score of 3, on Eliquis. -Resume home Eliquis.   ESRD on HD, noncompliant -Consult greatly appreciated, now he is back on dialys -Patient was counseled about noncompliance.   DVT prophylaxis: Corazon heparin>> Eliquis Code Status: Full Family Communication: None at bedside Disposition:   Status is: Inpatient    Consultants:  PCCM Renal   Subjective:  Significant events overnight, today he denies any complaints.  Objective: Vitals:   08/05/21 0700 08/05/21 0725 08/05/21 0800 08/05/21 0900  BP: (!) 130/93  126/73 105/75  Pulse: 83  90 83  Resp: (!) 22  (!) 21 (!) 23  Temp:  98.2 F (36.8 C)    TempSrc:  Oral    SpO2: 94%  93% 92%  Weight:      Height:        Intake/Output Summary (Last 24 hours) at 08/05/2021 0914 Last data filed at 08/05/2021 0725 Gross  per 24 hour  Intake 1823.54 ml  Output 3500 ml  Net -1676.46 ml   Filed Weights   08/04/21 1340 08/04/21 1826 08/05/21 0600  Weight: 76 kg 73 kg 81.3 kg    Examination:  Awake  Alert, Oriented X 3, No new F.N deficits, Normal affect Symmetrical Chest wall movement, Good air movement bilaterally, CTAB RRR,No Gallops,Rubs or new Murmurs, No Parasternal Heave +ve B.Sounds, Abd Soft, No tenderness, No rebound - guarding or rigidity. No Cyanosis, Clubbing or edema, No new Rash or bruise      Data Reviewed: I have personally reviewed following labs and imaging studies  CBC: Recent Labs  Lab 07/29/21 1951 08/03/21 1018 08/03/21 1403 08/03/21 1556 08/03/21 1612 08/04/21 0030 08/05/21 0337  WBC 5.5  --  6.6 5.9  --  6.2 6.8  NEUTROABS 3.6  --   --   --   --   --   --   HGB 9.7*   < > 9.5* 8.2* 8.2* 8.4* 9.3*  HCT 29.8*   < > 28.5* 23.8* 24.0* 24.4* 27.3*  MCV 95.5  --  93.1 91.5  --  90.0 91.9  PLT 227  --  270 234  --  242 253   < > = values in this interval not displayed.    Basic Metabolic Panel: Recent Labs  Lab 07/29/21 1951 08/03/21 1018 08/03/21 1402 08/03/21 1403 08/03/21 1556 08/03/21 1612 08/04/21 0030 08/04/21 0914 08/05/21 0337  NA 138   < > 131* 135 137 133* 135  --  130*  K 6.7*   < > 7.0* 7.3* 6.2* 5.8* 3.7 4.5 4.3  CL 97*   < > 102 98 98  --  96*  --  93*  CO2 16*  --   --  16* 18*  --  21*  --  25  GLUCOSE 88   < > 120* 121* 51*  --  95  --  107*  BUN 156*   < > >130* 143* 144*  --  65*  --  44*  CREATININE 17.42*   < > 16.20* 15.75* 16.02*  --  8.57*  --  6.64*  CALCIUM 8.3*  --   --  8.9 8.1*  --  8.0*  --  7.8*  MG  --   --   --   --   --   --  1.8  --   --   PHOS  --   --   --   --  >30.0*  --  6.5*  --  7.9*   < > = values in this interval not displayed.    GFR: Estimated Creatinine Clearance: 13.6 mL/min (A) (by C-G formula based on SCr of 6.64 mg/dL (H)).  Liver Function Tests: Recent Labs  Lab 07/29/21 1951 08/03/21 1403 08/03/21 1556 08/04/21 0030 08/05/21 0337  AST 22 40 63* 56*  --   ALT 29 46* 68* 67*  --   ALKPHOS 44 39 34* 32*  --   BILITOT 1.1 0.5 0.7 1.0  --   PROT 6.9 6.2* 5.8* 5.9*  --   ALBUMIN  3.3* 3.0* 2.7* 2.7* 2.7*    CBG: Recent Labs  Lab 08/04/21 0024 08/04/21 0345 08/04/21 0417 08/04/21 0753 08/04/21 1057  GLUCAP 98 65* 99 76 90     Recent Results (from the past 240 hour(s))  SARS Coronavirus 2 (TAT 6-24 hrs)     Status: None   Collection Time: 07/31/21 12:00 AM  Result  Value Ref Range Status   SARS Coronavirus 2 RESULT: NEGATIVE  Final    Comment: RESULT: NEGATIVESARS-CoV-2 INTERPRETATION:A NEGATIVE  test result means that SARS-CoV-2 RNA was not present in the specimen above the limit of detection of this test. This does not preclude a possible SARS-CoV-2 infection and should not be used as the  sole basis for patient management decisions. Negative results must be combined with clinical observations, patient history, and epidemiological information. Optimum specimen types and timing for peak viral levels during infections caused by SARS-CoV-2  have not been determined. Collection of multiple specimens or types of specimens may be necessary to detect virus. Improper specimen collection and handling, sequence variability under primers/probes, or organism present below the limit of detection may  lead to false negative results. Positive and negative predictive values of testing are highly dependent on prevalence. False negative test results are more likely when prevalence of disease is high.The expected result is NEGATIVE.Fact S heet for  Healthcare Providers: LocalChronicle.no Sheet for Patients: SalonLookup.es Reference Range - Negative          Radiology Studies: ECHOCARDIOGRAM COMPLETE  Result Date: 08/04/2021    ECHOCARDIOGRAM REPORT   Patient Name:   Fernando Stoiber. Date of Exam: 08/04/2021 Medical Rec #:  [id number]         Height:       74.0 in Accession #:    [id number]        Weight:       179.7 lb Date of Birth:  1960-08-12         BSA:          2.077 m Patient Age:    70 years          BP:            190/98 mmHg Patient Gender: M                 HR:           79 bpm. Exam Location:  Inpatient Procedure: 2D Echo, Cardiac Doppler and Color Doppler Indications:    Cardiac arrest  History:        Patient has prior history of Echocardiogram examinations. CHF;                 Risk Factors:Hypertension.  Sonographer:    Jyl Heinz Referring Phys: [phone] WHITNEY D HARRIS  Sonographer Comments: Image acquisition challenging due to respiratory motion. IMPRESSIONS  1. Left ventricular ejection fraction, by estimation, is 60 to 65%. The left ventricle has normal function. The left ventricle has no regional wall motion abnormalities. There is moderate left ventricular hypertrophy. Left ventricular diastolic parameters are consistent with Grade I diastolic dysfunction (impaired relaxation).  2. Right ventricular systolic function is severely reduced. The right ventricular size is moderately enlarged. There is severely elevated pulmonary artery systolic pressure. The estimated right ventricular systolic pressure is 382.5 mmHg.  3. Right atrial size was moderately dilated.  4. The mitral valve is grossly normal. No evidence of mitral valve regurgitation.  5. Tricuspid valve regurgitation is moderate.  6. There is mild calcification of the aortic valve. Aortic valve regurgitation is not visualized. Mild aortic valve stenosis.  7. The inferior vena cava is dilated in size with <50% respiratory variability, suggesting right atrial pressure of 15 mmHg. Conclusion(s)/Recommendation(s): Compared to prior echo, RV has significant dysfunction with worsening pulmonary hypertension. FINDINGS  Left Ventricle: Left ventricular ejection fraction, by estimation, is 60 to 65%. The left  ventricle has normal function. The left ventricle has no regional wall motion abnormalities. The left ventricular internal cavity size was normal in size. There is  moderate left ventricular hypertrophy. Left ventricular diastolic parameters are consistent  with Grade I diastolic dysfunction (impaired relaxation). Right Ventricle: The right ventricular size is moderately enlarged. No increase in right ventricular wall thickness. Right ventricular systolic function is severely reduced. There is severely elevated pulmonary artery systolic pressure. The tricuspid regurgitant velocity is 4.84 m/s, and with an assumed right atrial pressure of 15 mmHg, the estimated right ventricular systolic pressure is 517.6 mmHg. Left Atrium: Left atrial size was normal in size. Right Atrium: Right atrial size was moderately dilated. Pericardium: There is no evidence of pericardial effusion. Mitral Valve: The mitral valve is grossly normal. No evidence of mitral valve regurgitation. Tricuspid Valve: The tricuspid valve is normal in structure. Tricuspid valve regurgitation is moderate. Aortic Valve: There is mild calcification of the aortic valve. Aortic valve regurgitation is not visualized. Mild aortic stenosis is present. Aortic valve mean gradient measures 12.0 mmHg. Aortic valve peak gradient measures 21.3 mmHg. Aortic valve area,  by VTI measures 2.19 cm. Pulmonic Valve: The pulmonic valve was not well visualized. Pulmonic valve regurgitation is not visualized. Aorta: The aortic root and ascending aorta are structurally normal, with no evidence of dilitation. Venous: The inferior vena cava is dilated in size with less than 50% respiratory variability, suggesting right atrial pressure of 15 mmHg. IAS/Shunts: No atrial level shunt detected by color flow Doppler.  LEFT VENTRICLE PLAX 2D LVIDd:         4.80 cm      Diastology LVIDs:         3.10 cm      LV e' medial:    4.46 cm/s LV PW:         1.40 cm      LV E/e' medial:  13.2 LV IVS:        1.20 cm      LV e' lateral:   4.90 cm/s LVOT diam:     2.00 cm      LV E/e' lateral: 12.0 LV SV:         82 LV SV Index:   39 LVOT Area:     3.14 cm  LV Volumes (MOD) LV vol d, MOD A2C: 136.0 ml LV vol d, MOD A4C: 104.0 ml LV vol s, MOD A2C: 45.1  ml LV vol s, MOD A4C: 37.1 ml LV SV MOD A2C:     90.9 ml LV SV MOD A4C:     104.0 ml LV SV MOD BP:      76.2 ml RIGHT VENTRICLE            IVC RV Basal diam:  4.80 cm    IVC diam: 2.70 cm RV Mid diam:    4.60 cm RV S prime:     8.59 cm/s TAPSE (M-mode): 1.9 cm LEFT ATRIUM             Index        RIGHT ATRIUM           Index LA diam:        4.00 cm 1.93 cm/m   RA Area:     25.10 cm LA Vol (A2C):   56.4 ml 27.16 ml/m  RA Volume:   86.50 ml  41.65 ml/m LA Vol (A4C):   48.3 ml 23.26 ml/m LA Biplane Vol: 56.0 ml 26.97 ml/m  AORTIC  VALVE AV Area (Vmax):    2.11 cm AV Area (Vmean):   2.06 cm AV Area (VTI):     2.19 cm AV Vmax:           231.00 cm/s AV Vmean:          166.500 cm/s AV VTI:            0.374 m AV Peak Grad:      21.3 mmHg AV Mean Grad:      12.0 mmHg LVOT Vmax:         155.00 cm/s LVOT Vmean:        109.000 cm/s LVOT VTI:          0.260 m LVOT/AV VTI ratio: 0.70  AORTA Ao Root diam: 3.20 cm Ao Asc diam:  2.60 cm MITRAL VALVE                TRICUSPID VALVE MV Area (PHT): 4.15 cm     TR Peak grad:   93.7 mmHg MV Decel Time: 183 msec     TR Vmax:        484.00 cm/s MV E velocity: 58.70 cm/s MV A velocity: 107.00 cm/s  SHUNTS MV E/A ratio:  0.55         Systemic VTI:  0.26 m                             Systemic Diam: 2.00 cm Phineas Inches Electronically signed by Phineas Inches Signature Date/Time: 08/04/2021/11:49:35 AM    Final    DG Chest Port 1 View  Result Date: 08/03/2021 CLINICAL DATA:  Status post bronchoscopy with biopsy EXAM: PORTABLE CHEST 1 VIEW COMPARISON:  07/29/2021, CT 05/22/2021 FINDINGS: Endotracheal tube tip is about 5.5 cm superior to the carina. Esophageal tube tip incompletely visualized but below diaphragm. Mild cardiomegaly. Aortic atherosclerosis. Left suprahilar mass better seen on CT. Left mediastinal mass again noted. No definitive pneumothorax is seen. IMPRESSION: 1. Interval intubation with tip of endotracheal tube about 5.5 cm superior to carina 2. Negative for pneumothorax  3. Stable cardiomediastinal silhouette with mediastinal mass. Left suprahilar lung mass is better seen on CT. Electronically Signed   By: Donavan Foil M.D.   On: 08/03/2021 15:16   DG C-Arm 1-60 Min-No Report  Result Date: 08/03/2021 Fluoroscopy was utilized by the requesting physician.  No radiographic interpretation.   DG C-ARM BRONCHOSCOPY  Result Date: 08/03/2021 C-ARM BRONCHOSCOPY: Fluoroscopy was utilized by the requesting physician.  No radiographic interpretation.        Scheduled Meds:  amLODipine  10 mg Oral QHS   carvedilol  6.25 mg Oral BID WC   Chlorhexidine Gluconate Cloth  6 each Topical Q0600   darbepoetin (ARANESP) injection - DIALYSIS  100 mcg Intravenous Q Tue-HD   docusate sodium  100 mg Oral BID   heparin  5,000 Units Subcutaneous Q8H   insulin aspart  10 Units Intravenous Once   pantoprazole  40 mg Oral Daily   polyethylene glycol  17 g Oral Daily   Continuous Infusions:  ferric gluconate (FERRLECIT) IVPB Stopped (08/04/21 1549)     LOS: 2 days       Phillips Climes, MD [facility]   To contact the attending provider between 7A-7P or the covering provider during after hours 7P-7A, please log into the web site [email or web address] and access using universal Rafael Gonzalez password for that web site. If you do not have the password,  please call the hospital operator.  08/05/2021, 9:14 AM

## 2021-08-05 NOTE — Telephone Encounter (Signed)
Please advise if you are ok with ordering Covid testing?   I have tried to call the number listed and he is currently in the hospital.

## 2021-08-05 NOTE — Progress Notes (Addendum)
Pt c/o wheezing, has expiratory wheezing on auscultation. No respiratory tx currently ordered. Text-paged Hospitalist Dr. Myna Hidalgo requesting albuterol inhaler (pt takes at home). Awaiting further orders.  Henreitta Leber, RN 8:05 PM. 08/05/21   Addendum: albuterol ordered, will administer 8:14 PM

## 2021-08-05 NOTE — Progress Notes (Signed)
Patient refused Eliquis this morning. He was prescribed Eliquis before this hospital admission, but did not fill the prescription. He takes turmeric and ginger supplements at home. MD and rounding team notified.

## 2021-08-05 NOTE — Progress Notes (Signed)
Pt receives out-pt HD at [facility] on TTS. Pt arrives at 11:30 for 11:45 chair time. Will assist as needed.  Melven Sartorius Renal Navigator [phone]

## 2021-08-06 DIAGNOSIS — I469 Cardiac arrest, cause unspecified: Secondary | ICD-10-CM | POA: Diagnosis not present

## 2021-08-06 DIAGNOSIS — Z992 Dependence on renal dialysis: Secondary | ICD-10-CM | POA: Diagnosis not present

## 2021-08-06 DIAGNOSIS — R918 Other nonspecific abnormal finding of lung field: Secondary | ICD-10-CM | POA: Diagnosis not present

## 2021-08-06 DIAGNOSIS — N186 End stage renal disease: Secondary | ICD-10-CM | POA: Diagnosis not present

## 2021-08-06 LAB — BASIC METABOLIC PANEL
Anion gap: 17 — ABNORMAL HIGH (ref 5–15)
BUN: 58 mg/dL — ABNORMAL HIGH (ref 6–20)
CO2: 21 mmol/L — ABNORMAL LOW (ref 22–32)
Calcium: 8 mg/dL — ABNORMAL LOW (ref 8.9–10.3)
Chloride: 91 mmol/L — ABNORMAL LOW (ref 98–111)
Creatinine, Ser: 8.1 mg/dL — ABNORMAL HIGH (ref 0.61–1.24)
GFR, Estimated: 7 mL/min — ABNORMAL LOW (ref >60)
Glucose, Bld: 100 mg/dL — ABNORMAL HIGH (ref 70–99)
Potassium: 5.1 mmol/L (ref 3.5–5.1)
Sodium: 129 mmol/L — ABNORMAL LOW (ref 135–145)

## 2021-08-06 LAB — CBC
HCT: 26.2 % — ABNORMAL LOW (ref 39.0–52.0)
Hemoglobin: 8.9 g/dL — ABNORMAL LOW (ref 13.0–17.0)
MCH: 31 pg (ref 26.0–34.0)
MCHC: 34 g/dL (ref 30.0–36.0)
MCV: 91.3 fL (ref 80.0–100.0)
Platelets: 226 10*3/uL (ref 150–400)
RBC: 2.87 MIL/uL — ABNORMAL LOW (ref 4.22–5.81)
RDW: 14.8 % (ref 11.5–15.5)
WBC: 7.6 10*3/uL (ref 4.0–10.5)
nRBC: 0.4 % — ABNORMAL HIGH (ref 0.0–0.2)

## 2021-08-06 MED ORDER — IPRATROPIUM-ALBUTEROL 0.5-2.5 (3) MG/3ML IN SOLN
3.0000 mL | Freq: Four times a day (QID) | RESPIRATORY_TRACT | Status: DC
  Administered 2021-08-06 – 2021-08-08 (×7): 3 mL via RESPIRATORY_TRACT
  Filled 2021-08-06 (×8): qty 3

## 2021-08-06 MED ORDER — METHYLPREDNISOLONE SODIUM SUCC 125 MG IJ SOLR
80.0000 mg | Freq: Two times a day (BID) | INTRAMUSCULAR | Status: DC
Start: 2021-08-06 — End: 2021-08-09
  Administered 2021-08-06 – 2021-08-08 (×5): 80 mg via INTRAVENOUS
  Filled 2021-08-06 (×5): qty 2

## 2021-08-06 MED ORDER — HEPARIN SODIUM (PORCINE) 5000 UNIT/ML IJ SOLN
5000.0000 [IU] | Freq: Three times a day (TID) | INTRAMUSCULAR | Status: DC
  Administered 2021-08-06: 5000 [IU] via SUBCUTANEOUS
  Filled 2021-08-06 (×3): qty 1

## 2021-08-06 NOTE — Progress Notes (Signed)
PT Cancellation Note  Patient Details Name: Jason [name]. MRN: [id number] DOB: November 13, 1960   Cancelled Treatment:    Reason Eval/Treat Not Completed: Patient at procedure or test/unavailable, pt off unit at HD. Will check back as schedule allows to continue with PT POC.  Audry Riles. PTA Acute Rehabilitation Services Office: [place] 08/06/2021, 1:40 PM

## 2021-08-06 NOTE — Progress Notes (Addendum)
[facility] Progress Note   Subjective: Extubated, on HD. Noncompliant with HD but he is homeless and is struggling. Says he lives in hotels and sometimes with his cousin. No C/Os.  Objective Vitals:   08/06/21 0414 08/06/21 0656 08/06/21 0758 08/06/21 0812  BP: 128/90  123/81 138/85  Pulse: 84 (!) 106 79 82  Resp: 17 (!) '21 19 20  '$ Temp: 97.9 F (36.6 C)  98.7 F (37.1 C)   TempSrc: Oral  Oral   SpO2: 94% 100% 93% 96%  Weight: 81.3 kg  83.6 kg   Height:       Physical Exam General: Chronically ill appearing male in NAD Heart: S1,S2 RRR 3/6 harsh M 5th ICS MCL-No rub. SR on monitor Lungs: Upper airway rhonchi which mostly clear with cough. No WOB.  Abdomen: NABS, NT Extremities: No LE edema Dialysis Access: R AVF aneurysmal, cannulated at present.   Additional Objective Labs: Basic Metabolic Panel: Recent Labs  Lab 08/03/21 1556 08/03/21 1612 08/04/21 0030 08/04/21 0914 08/05/21 0337 08/06/21 0044  NA 137   < > 135  --  130* 129*  K 6.2*   < > 3.7 4.5 4.3 5.1  CL 98  --  96*  --  93* 91*  CO2 18*  --  21*  --  25 21*  GLUCOSE 51*  --  95  --  107* 100*  BUN 144*  --  65*  --  44* 58*  CREATININE 16.02*  --  8.57*  --  6.64* 8.10*  CALCIUM 8.1*  --  8.0*  --  7.8* 8.0*  PHOS >30.0*  --  6.5*  --  7.9*  --    < > = values in this interval not displayed.   Liver Function Tests: Recent Labs  Lab 08/03/21 1403 08/03/21 1556 08/04/21 0030 08/05/21 0337  AST 40 63* 56*  --   ALT 46* 68* 67*  --   ALKPHOS 39 34* 32*  --   BILITOT 0.5 0.7 1.0  --   PROT 6.2* 5.8* 5.9*  --   ALBUMIN 3.0* 2.7* 2.7* 2.7*   No results for input(s): "LIPASE", "AMYLASE" in the last 168 hours. CBC: Recent Labs  Lab 08/03/21 1403 08/03/21 1556 08/03/21 1612 08/04/21 0030 08/05/21 0337 08/06/21 0044  WBC 6.6 5.9  --  6.2 6.8 7.6  HGB 9.5* 8.2*   < > 8.4* 9.3* 8.9*  HCT 28.5* 23.8*   < > 24.4* 27.3* 26.2*  MCV 93.1 91.5  --  90.0 91.9 91.3  PLT 270 234  --   242 253 226   < > = values in this interval not displayed.   Blood Culture    Component Value Date/Time   SDES BLOOD RIGHT FOREARM 02/08/2021 1315   SPECREQUEST  02/08/2021 1315    BOTTLES DRAWN AEROBIC AND ANAEROBIC Blood Culture results may not be optimal due to an excessive volume of blood received in culture bottles   CULT  02/08/2021 1315    NO GROWTH 5 DAYS Performed at [facility], [place] [address] [place], [place] [address]    REPTSTATUS 02/13/2021 FINAL 02/08/2021 1315    Cardiac Enzymes: No results for input(s): "CKTOTAL", "CKMB", "CKMBINDEX", "TROPONINI" in the last 168 hours. CBG: Recent Labs  Lab 08/04/21 0024 08/04/21 0345 08/04/21 0417 08/04/21 0753 08/04/21 1057  GLUCAP 98 65* 99 76 90   Iron Studies: No results for input(s): "IRON", "TIBC", "TRANSFERRIN", "FERRITIN" in the last 72 hours. '@lablastinr3'$ @ Studies/Results:  ECHOCARDIOGRAM COMPLETE  Result Date: 08/04/2021    ECHOCARDIOGRAM REPORT   Patient Name:   Jason [name]. Date of Exam: 08/04/2021 Medical Rec #:  [id number]         Height:       74.0 in Accession #:    [id number]        Weight:       179.7 lb Date of Birth:  04-05-1960         BSA:          2.077 m Patient Age:    61 years          BP:           190/98 mmHg Patient Gender: M                 HR:           79 bpm. Exam Location:  Inpatient Procedure: 2D Echo, Cardiac Doppler and Color Doppler Indications:    Cardiac arrest  History:        Patient has prior history of Echocardiogram examinations. CHF;                 Risk Factors:Hypertension.  Sonographer:    Jyl Heinz Referring Phys: [phone] WHITNEY D HARRIS  Sonographer Comments: Image acquisition challenging due to respiratory motion. IMPRESSIONS  1. Left ventricular ejection fraction, by estimation, is 60 to 65%. The left ventricle has normal function. The left ventricle has no regional wall motion abnormalities. There is moderate left ventricular hypertrophy. Left ventricular diastolic  parameters are consistent with Grade I diastolic dysfunction (impaired relaxation).  2. Right ventricular systolic function is severely reduced. The right ventricular size is moderately enlarged. There is severely elevated pulmonary artery systolic pressure. The estimated right ventricular systolic pressure is 761.6 mmHg.  3. Right atrial size was moderately dilated.  4. The mitral valve is grossly normal. No evidence of mitral valve regurgitation.  5. Tricuspid valve regurgitation is moderate.  6. There is mild calcification of the aortic valve. Aortic valve regurgitation is not visualized. Mild aortic valve stenosis.  7. The inferior vena cava is dilated in size with <50% respiratory variability, suggesting right atrial pressure of 15 mmHg. Conclusion(s)/Recommendation(s): Compared to prior echo, RV has significant dysfunction with worsening pulmonary hypertension. FINDINGS  Left Ventricle: Left ventricular ejection fraction, by estimation, is 60 to 65%. The left ventricle has normal function. The left ventricle has no regional wall motion abnormalities. The left ventricular internal cavity size was normal in size. There is  moderate left ventricular hypertrophy. Left ventricular diastolic parameters are consistent with Grade I diastolic dysfunction (impaired relaxation). Right Ventricle: The right ventricular size is moderately enlarged. No increase in right ventricular wall thickness. Right ventricular systolic function is severely reduced. There is severely elevated pulmonary artery systolic pressure. The tricuspid regurgitant velocity is 4.84 m/s, and with an assumed right atrial pressure of 15 mmHg, the estimated right ventricular systolic pressure is 073.7 mmHg. Left Atrium: Left atrial size was normal in size. Right Atrium: Right atrial size was moderately dilated. Pericardium: There is no evidence of pericardial effusion. Mitral Valve: The mitral valve is grossly normal. No evidence of mitral valve  regurgitation. Tricuspid Valve: The tricuspid valve is normal in structure. Tricuspid valve regurgitation is moderate. Aortic Valve: There is mild calcification of the aortic valve. Aortic valve regurgitation is not visualized. Mild aortic stenosis is present. Aortic valve mean gradient measures 12.0 mmHg. Aortic valve peak gradient measures 21.3 mmHg.  Aortic valve area,  by VTI measures 2.19 cm. Pulmonic Valve: The pulmonic valve was not well visualized. Pulmonic valve regurgitation is not visualized. Aorta: The aortic root and ascending aorta are structurally normal, with no evidence of dilitation. Venous: The inferior vena cava is dilated in size with less than 50% respiratory variability, suggesting right atrial pressure of 15 mmHg. IAS/Shunts: No atrial level shunt detected by color flow Doppler.  LEFT VENTRICLE PLAX 2D LVIDd:         4.80 cm      Diastology LVIDs:         3.10 cm      LV e' medial:    4.46 cm/s LV PW:         1.40 cm      LV E/e' medial:  13.2 LV IVS:        1.20 cm      LV e' lateral:   4.90 cm/s LVOT diam:     2.00 cm      LV E/e' lateral: 12.0 LV SV:         82 LV SV Index:   39 LVOT Area:     3.14 cm  LV Volumes (MOD) LV vol d, MOD A2C: 136.0 ml LV vol d, MOD A4C: 104.0 ml LV vol s, MOD A2C: 45.1 ml LV vol s, MOD A4C: 37.1 ml LV SV MOD A2C:     90.9 ml LV SV MOD A4C:     104.0 ml LV SV MOD BP:      76.2 ml RIGHT VENTRICLE            IVC RV Basal diam:  4.80 cm    IVC diam: 2.70 cm RV Mid diam:    4.60 cm RV S prime:     8.59 cm/s TAPSE (M-mode): 1.9 cm LEFT ATRIUM             Index        RIGHT ATRIUM           Index LA diam:        4.00 cm 1.93 cm/m   RA Area:     25.10 cm LA Vol (A2C):   56.4 ml 27.16 ml/m  RA Volume:   86.50 ml  41.65 ml/m LA Vol (A4C):   48.3 ml 23.26 ml/m LA Biplane Vol: 56.0 ml 26.97 ml/m  AORTIC VALVE AV Area (Vmax):    2.11 cm AV Area (Vmean):   2.06 cm AV Area (VTI):     2.19 cm AV Vmax:           231.00 cm/s AV Vmean:          166.500 cm/s AV VTI:             0.374 m AV Peak Grad:      21.3 mmHg AV Mean Grad:      12.0 mmHg LVOT Vmax:         155.00 cm/s LVOT Vmean:        109.000 cm/s LVOT VTI:          0.260 m LVOT/AV VTI ratio: 0.70  AORTA Ao Root diam: 3.20 cm Ao Asc diam:  2.60 cm MITRAL VALVE                TRICUSPID VALVE MV Area (PHT): 4.15 cm     TR Peak grad:   93.7 mmHg MV Decel Time: 183 msec     TR Vmax:  484.00 cm/s MV E velocity: 58.70 cm/s MV A velocity: 107.00 cm/s  SHUNTS MV E/A ratio:  0.55         Systemic VTI:  0.26 m                             Systemic Diam: 2.00 cm Phineas Inches Electronically signed by Phineas Inches Signature Date/Time: 08/04/2021/11:49:35 AM    Final    Medications:  ferric gluconate (FERRLECIT) IVPB Stopped (08/04/21 1549)    carvedilol  6.25 mg Oral BID WC   Chlorhexidine Gluconate Cloth  6 each Topical Q0600   Chlorhexidine Gluconate Cloth  6 each Topical Q0600   darbepoetin (ARANESP) injection - DIALYSIS  100 mcg Intravenous Q Tue-HD   docusate sodium  100 mg Oral BID   heparin injection (subcutaneous)  5,000 Units Subcutaneous Q8H   insulin aspart  10 Units Intravenous Once   ipratropium-albuterol  3 mL Nebulization QID   methylPREDNISolone (SOLU-MEDROL) injection  80 mg Intravenous Q12H   pantoprazole  40 mg Oral Daily   polyethylene glycol  17 g Oral Daily     OP HD: [facility]  4h 74mn   400/800   76kjg  2/2 bath   RUA AVF    -Hep 6000 units IV TIW - mircera 100 mcg IV q 2wks, last 7/5, due 7/19 (missed) - venofer 100 mg IV q  HD X 10 doses (not started) - doxercalciferol 7 ug tiw IV         Assessment/ Plan: Cardiac arrest - brief w/ 2-3 min CPR, on vent in ICU SP bronchoscopy - done today Hyperkalemia - Treated medically. 5.1 this AM.  ESRD - on HD TTS. Has missed last 4 HD sessions. H/O noncompliance with HD now made worse by homelessness.  Next HD 08/08/2021..Marland Kitchen Anemia esrd - Hb 9s, will give esa w/ next HD here. Cont IV fe load.  MBD ckd - CCa in range, will add on phos. Cont  IV vdra.     Latreshia Beauchaine H. Alp Goldwater NP-C 08/06/2021, 8:37 AM  [id number] [phone]

## 2021-08-06 NOTE — Progress Notes (Addendum)
PROGRESS NOTE    Jason [name].  WUJ:[id number] DOB: [birth year] DOA: 08/03/2021 PCP: [name], FNP    No chief complaint on file.   Brief Narrative:   61 year old man who presented to [facility] 7/24 for elective navigational bronchoscopy for evaluation of lung nodule. Subsequently experienced bradycardic arrest post-procedure. PMHx significant for ESRD on iHD T/T/S (recently self discontinued iHD in favor of homeopathic treatments, Afib (on Eliquis), CHF, HTN, pulmonary hypertension, anemia, and arthritis.   Initially patient tolerated procedure well but upon waking from anesthesia patient began to cough on ETT with concern for vasovagal event leading to bradycardia and periarrest x 2, received chest compressions and ACLS medications prior to stabilization.    Patient remained intubated and was admitted to ICU, were consulted where patient was dialyzed, he was successfully extubated, and transferred to [facility] service 7/26.  Assessment & Plan:   Principal Problem:   Lung nodules Active Problems:   Non-compliance with renal dialysis   ESRD (end stage renal disease) on dialysis [facility])   Essential hypertension   Chronic diastolic CHF (congestive heart failure) (HCC)   Cardiac arrest (HCC)   S/p bradycardic arrest, suspect vasovagal component - Peri-arrest x 2 in the immediate post-procedure period, suspect vasovagal component of bradycardia +/- severe hyperkalemia. Unclear if patient every truly lost pulses; 2 rounds of CPR, atropine/Epi. -Patient was intubated for airway protection. -Admitted to ICU, currently stabilized, continue A-line and transfer out of ICU -Hypertensive, did not require any vasopressors - Admitted to ICU for post-arrest monitoring - Remains hypertensive, no need for vasopressors - Troponin x 1 sent, elevated 481 post-arrest/CPR -2D echo with a preserved EF, but significant for severely elevated pulmonary artery systolic pressure.   Acute on chronic hypoxic   respiratory failure Respiratory acidosis COPD acute exacerbation Former tobacco abuse, stopped 20 years ago -Since remained intubated postprocedure(bronchoscopy) admitted to ICU, extubated 7/25AM. - Continue supplemental O2 support. - Wean O2 for sat > 90% - Pulmonary hygiene - Intermittent CXR -Wean oxygen as tolerated. -Patient with significant wheezing this morning, started on IV steroids, scheduled DuoNebs and as needed albuterol   Mediastinal mass S/p bronchoscopy 7/24. Originally noted in 2020, evaluated at [facility] with bx negative for malignancy. CT Chest at that time c/f mass vs. esophageal duplication/bronchial cyst. Repeat CT at [facility] similar, slightly larger in size.  - S/p bronchoscopy 7/24 (Dr. Valeta Harms) - F/u pathology - Repeat imaging TBD  Hypertension -Blood pressure on the lower side today, will discontinue Norvasc, continue with Coreg.   Diastolic CHF - Echo 09/5619 with EF 50-55%, moderately increased LVH, G1DD.  Repeat echo this admission with EF 30%, grade 1 diastolic dysfunction. -Volume management with HD.   AFib CHADS2VASC score of 3, patient is on Eliquis as an outpatient, but he is not taking it, I have discussed with him risk factors of acute CVA, he is adamant about not taking it despite explaining risks and benefits.  ESRD on HD, noncompliant -Consult greatly appreciated, now he is back on dialys -Patient was counseled about noncompliance.   DVT prophylaxis: [place] heparin Code Status: Full Family Communication: None at bedside Disposition:   Status is: Inpatient    Consultants:  PCCM Renal   Subjective:  Reports dyspnea,  Objective: Vitals:   08/06/21 1200 08/06/21 1221 08/06/21 1225 08/06/21 1353  BP: (!) 162/112 (!) 166/115 (!) 166/110 (!) 175/109  Pulse: 81 80 81   Resp: '16 15 17   '$ Temp:    97.9 F (36.6 C)  TempSrc:    Oral  SpO2: 97% 96% 98%   Weight:   81.3 kg   Height:        Intake/Output Summary (Last 24 hours) at  08/06/2021 1430 Last data filed at 08/06/2021 1225 Gross per 24 hour  Intake 600 ml  Output 3501 ml  Net -2901 ml   Filed Weights   08/06/21 0414 08/06/21 0758 08/06/21 1225  Weight: 81.3 kg 83.6 kg 81.3 kg    Examination:  Awake Alert, Oriented X 3, No new F.N deficits, Normal affect Symmetrical Chest wall movement, diffuse wheezing RRR,No Gallops,Rubs + Murmurs, No Parasternal Heave +ve B.Sounds, Abd Soft, No tenderness, No rebound - guarding or rigidity. No Cyanosis, Clubbing or edema, No new Rash or bruise     Data Reviewed: I have personally reviewed following labs and imaging studies  CBC: Recent Labs  Lab 08/03/21 1403 08/03/21 1556 08/03/21 1612 08/04/21 0030 08/05/21 0337 08/06/21 0044  WBC 6.6 5.9  --  6.2 6.8 7.6  HGB 9.5* 8.2* 8.2* 8.4* 9.3* 8.9*  HCT 28.5* 23.8* 24.0* 24.4* 27.3* 26.2*  MCV 93.1 91.5  --  90.0 91.9 91.3  PLT 270 234  --  242 253 932    Basic Metabolic Panel: Recent Labs  Lab 08/03/21 1403 08/03/21 1556 08/03/21 1612 08/04/21 0030 08/04/21 0914 08/05/21 0337 08/06/21 0044  NA 135 137 133* 135  --  130* 129*  K 7.3* 6.2* 5.8* 3.7 4.5 4.3 5.1  CL 98 98  --  96*  --  93* 91*  CO2 16* 18*  --  21*  --  25 21*  GLUCOSE 121* 51*  --  95  --  107* 100*  BUN 143* 144*  --  65*  --  44* 58*  CREATININE 15.75* 16.02*  --  8.57*  --  6.64* 8.10*  CALCIUM 8.9 8.1*  --  8.0*  --  7.8* 8.0*  MG  --   --   --  1.8  --   --   --   PHOS  --  >30.0*  --  6.5*  --  7.9*  --     GFR: Estimated Creatinine Clearance: 11.2 mL/min (A) (by C-G formula based on SCr of 8.1 mg/dL (H)).  Liver Function Tests: Recent Labs  Lab 08/03/21 1403 08/03/21 1556 08/04/21 0030 08/05/21 0337  AST 40 63* 56*  --   ALT 46* 68* 67*  --   ALKPHOS 39 34* 32*  --   BILITOT 0.5 0.7 1.0  --   PROT 6.2* 5.8* 5.9*  --   ALBUMIN 3.0* 2.7* 2.7* 2.7*    CBG: Recent Labs  Lab 08/04/21 0024 08/04/21 0345 08/04/21 0417 08/04/21 0753 08/04/21 1057  GLUCAP 98  65* 99 76 90     Recent Results (from the past 240 hour(s))  SARS Coronavirus 2 (TAT 6-24 hrs)     Status: None   Collection Time: 07/31/21 12:00 AM  Result Value Ref Range Status   SARS Coronavirus 2 RESULT: NEGATIVE  Final    Comment: RESULT: NEGATIVESARS-CoV-2 INTERPRETATION:A NEGATIVE  test result means that SARS-CoV-2 RNA was not present in the specimen above the limit of detection of this test. This does not preclude a possible SARS-CoV-2 infection and should not be used as the  sole basis for patient management decisions. Negative results must be combined with clinical observations, patient history, and epidemiological information. Optimum specimen types and timing for peak viral levels during infections caused by SARS-CoV-2  have not been determined. Collection of multiple specimens or types of specimens may be necessary to detect virus. Improper specimen collection and handling, sequence variability under primers/probes, or organism present below the limit of detection may  lead to false negative results. Positive and negative predictive values of testing are highly dependent on prevalence. False negative test results are more likely when prevalence of disease is high.The expected result is NEGATIVE.Fact S heet for  Healthcare Providers: LocalChronicle.no Sheet for Patients: SalonLookup.es Reference Range - Negative   MRSA Next Gen by PCR, Nasal     Status: None   Collection Time: 08/05/21  3:37 AM   Specimen: Nasal Mucosa; Nasal Swab  Result Value Ref Range Status   MRSA by PCR Next Gen NOT DETECTED NOT DETECTED Final    Comment: (NOTE) The GeneXpert MRSA Assay (FDA approved for NASAL specimens only), is one component of a comprehensive MRSA colonization surveillance program. It is not intended to diagnose MRSA infection nor to guide or monitor treatment for MRSA infections. Test performance is not FDA approved in  patients less than 37 years old. Performed at [facility], [place] [address] [place], [place] [address]          Radiology Studies: No results found.      Scheduled Meds:  carvedilol  6.25 mg Oral BID WC   Chlorhexidine Gluconate Cloth  6 each Topical Q0600   Chlorhexidine Gluconate Cloth  6 each Topical Q0600   darbepoetin (ARANESP) injection - DIALYSIS  100 mcg Intravenous Q Tue-HD   docusate sodium  100 mg Oral BID   heparin injection (subcutaneous)  5,000 Units Subcutaneous Q8H   insulin aspart  10 Units Intravenous Once   ipratropium-albuterol  3 mL Nebulization QID   methylPREDNISolone (SOLU-MEDROL) injection  80 mg Intravenous Q12H   pantoprazole  40 mg Oral Daily   polyethylene glycol  17 g Oral Daily   Continuous Infusions:  ferric gluconate (FERRLECIT) IVPB 125 mg (08/06/21 1038)     LOS: 3 days       Phillips Climes, MD [facility]   To contact the attending provider between 7A-7P or the covering provider during after hours 7P-7A, please log into the web site [email or web address] and access using universal [place] password for that web site. If you do not have the password, please call the hospital operator.  08/06/2021, 2:30 PM

## 2021-08-07 ENCOUNTER — Other Ambulatory Visit: Payer: Self-pay

## 2021-08-07 ENCOUNTER — Other Ambulatory Visit (HOSPITAL_COMMUNITY): Payer: Self-pay

## 2021-08-07 DIAGNOSIS — I469 Cardiac arrest, cause unspecified: Secondary | ICD-10-CM | POA: Diagnosis not present

## 2021-08-07 DIAGNOSIS — Z992 Dependence on renal dialysis: Secondary | ICD-10-CM | POA: Diagnosis not present

## 2021-08-07 DIAGNOSIS — R918 Other nonspecific abnormal finding of lung field: Secondary | ICD-10-CM | POA: Diagnosis not present

## 2021-08-07 DIAGNOSIS — Z01818 Encounter for other preprocedural examination: Secondary | ICD-10-CM

## 2021-08-07 DIAGNOSIS — N186 End stage renal disease: Secondary | ICD-10-CM | POA: Diagnosis not present

## 2021-08-07 LAB — GLUCOSE, CAPILLARY: Glucose-Capillary: 154 mg/dL — ABNORMAL HIGH (ref 70–99)

## 2021-08-07 MED ORDER — PREDNISONE 10 MG (21) PO TBPK
ORAL_TABLET | ORAL | 0 refills | Status: DC
  Filled 2021-08-07: qty 21, 6d supply, fill #0

## 2021-08-07 MED ORDER — CHLORHEXIDINE GLUCONATE CLOTH 2 % EX PADS
6.0000 | MEDICATED_PAD | Freq: Every day | CUTANEOUS | Status: DC
Start: 2021-08-08 — End: 2021-08-09

## 2021-08-07 MED ORDER — HYDROMORPHONE HCL 1 MG/ML IJ SOLN
0.5000 mg | INTRAMUSCULAR | Status: DC | PRN
  Administered 2021-08-07 – 2021-08-08 (×3): 0.5 mg via INTRAVENOUS
  Filled 2021-08-07 (×3): qty 0.5

## 2021-08-07 NOTE — Progress Notes (Signed)
Mobility Specialist Progress Note:   08/07/21 1514  Mobility  Activity Refused mobility   Pt refused mobility stating he had already walked today and was too tired. Will f/u as able.    Jaye Polidori Mobility Specialist-Acute Rehab Secure Chat only

## 2021-08-07 NOTE — Progress Notes (Signed)
PROGRESS NOTE    Jason [name].  ZOX:[id number] DOB: 1960-12-20 DOA: 08/03/2021 PCP: [name], FNP    No chief complaint on file.   Brief Narrative:   61 year old man who presented to [facility] 7/24 for elective navigational bronchoscopy for evaluation of lung nodule. Subsequently experienced bradycardic arrest post-procedure. PMHx significant for ESRD on iHD T/T/S (recently self discontinued iHD in favor of homeopathic treatments, Afib (on Eliquis), CHF, HTN, pulmonary hypertension, anemia, and arthritis.   Initially patient tolerated procedure well but upon waking from anesthesia patient began to cough on ETT with concern for vasovagal event leading to bradycardia and periarrest x 2, received chest compressions and ACLS medications prior to stabilization.    Patient remained intubated and was admitted to ICU, were consulted where patient was dialyzed, he was successfully extubated, and transferred to [facility] service 7/26.  Assessment & Plan:   Principal Problem:   Lung nodules Active Problems:   Non-compliance with renal dialysis   ESRD (end stage renal disease) on dialysis [facility])   Essential hypertension   Chronic diastolic CHF (congestive heart failure) (HCC)   Cardiac arrest (HCC)   S/p bradycardic arrest, suspect vasovagal component - Peri-arrest x 2 in the immediate post-procedure period, suspect vasovagal component of bradycardia +/- severe hyperkalemia. Unclear if patient every truly lost pulses; 2 rounds of CPR, atropine/Epi. -Patient was intubated for airway protection. -Admitted to ICU, currently stabilized, continue A-line and transfer out of ICU -Hypertensive, did not require any vasopressors - Admitted to ICU for post-arrest monitoring - Remains hypertensive, no need for vasopressors - Troponin x 1 sent, elevated 481 post-arrest/CPR -2D echo with a preserved EF, but significant for severely elevated pulmonary artery systolic pressure.   Acute on chronic hypoxic   respiratory failure Respiratory acidosis COPD acute exacerbation Former tobacco abuse, stopped 20 years ago -Since remained intubated postprocedure(bronchoscopy) admitted to ICU, extubated 7/25AM. - Continue supplemental O2 support. - Wean O2 for sat > 90% - Pulmonary hygiene - Intermittent CXR -Wean oxygen as tolerated. -Wheezing has improved, but remains significant, hopefully can transition to p.o. prednisone tomorrow, continue with scheduled DuoNebs and as needed albuterol.   Mediastinal mass S/p bronchoscopy 7/24. Originally noted in 2020, evaluated at [facility] with bx negative for malignancy. CT Chest at that time c/f mass vs. esophageal duplication/bronchial cyst. Repeat CT at [facility] similar, slightly larger in size.  - S/p bronchoscopy 7/24 (Dr. Valeta Harms) -Pathology with no evidence of malignancy.  Hypertension -Blood pressure on the lower side today, will discontinue Norvasc, continue with Coreg.   Diastolic CHF - Echo 08/1189 with EF 50-55%, moderately increased LVH, G1DD.  Repeat echo this admission with EF 47%, grade 1 diastolic dysfunction. -Volume management with HD.   AFib CHADS2VASC score of 3, patient is on Eliquis as an outpatient, but he is not taking it, I have discussed with him risk factors of acute CVA, he is adamant about not taking it despite explaining risks and benefits.  ESRD on HD, noncompliant -Consult greatly appreciated, now he is back on dialys -Patient was counseled about noncompliance.   DVT prophylaxis: [place] heparin Code Status: Full Family Communication: None at bedside Disposition:   Status is: Inpatient    Consultants:  PCCM Renal   Subjective:  No chest pain, reports his dyspnea has improved, no cough  Objective: Vitals:   08/07/21 0615 08/07/21 0737 08/07/21 0747 08/07/21 0842  BP:    (!) 137/94  Pulse:    93  Resp:    18  Temp:    97.6 F (36.4 C)  TempSrc:    Oral  SpO2: 96% 90% 92% 96%  Weight:      Height:         Intake/Output Summary (Last 24 hours) at 08/07/2021 0946 Last data filed at 08/07/2021 0855 Gross per 24 hour  Intake 1711.15 ml  Output 3500 ml  Net -1788.85 ml   Filed Weights   08/06/21 0758 08/06/21 1225 08/07/21 0423  Weight: 83.6 kg 81.3 kg 80.4 kg    Examination:  Awake Alert, Oriented X 3, No new F.N deficits, Normal affect Symmetrical Chest wall movement, improved air entry, less wheezing today RRR,No Gallops,Rubs ,+ Murmurs, No Parasternal Heave +ve B.Sounds, Abd Soft, No tenderness, No rebound - guarding or rigidity. No Cyanosis, Clubbing or edema, No new Rash or bruise     Data Reviewed: I have personally reviewed following labs and imaging studies  CBC: Recent Labs  Lab 08/03/21 1403 08/03/21 1556 08/03/21 1612 08/04/21 0030 08/05/21 0337 08/06/21 0044  WBC 6.6 5.9  --  6.2 6.8 7.6  HGB 9.5* 8.2* 8.2* 8.4* 9.3* 8.9*  HCT 28.5* 23.8* 24.0* 24.4* 27.3* 26.2*  MCV 93.1 91.5  --  90.0 91.9 91.3  PLT 270 234  --  242 253 295    Basic Metabolic Panel: Recent Labs  Lab 08/03/21 1403 08/03/21 1556 08/03/21 1612 08/04/21 0030 08/04/21 0914 08/05/21 0337 08/06/21 0044  NA 135 137 133* 135  --  130* 129*  K 7.3* 6.2* 5.8* 3.7 4.5 4.3 5.1  CL 98 98  --  96*  --  93* 91*  CO2 16* 18*  --  21*  --  25 21*  GLUCOSE 121* 51*  --  95  --  107* 100*  BUN 143* 144*  --  65*  --  44* 58*  CREATININE 15.75* 16.02*  --  8.57*  --  6.64* 8.10*  CALCIUM 8.9 8.1*  --  8.0*  --  7.8* 8.0*  MG  --   --   --  1.8  --   --   --   PHOS  --  >30.0*  --  6.5*  --  7.9*  --     GFR: Estimated Creatinine Clearance: 11 mL/min (A) (by C-G formula based on SCr of 8.1 mg/dL (H)).  Liver Function Tests: Recent Labs  Lab 08/03/21 1403 08/03/21 1556 08/04/21 0030 08/05/21 0337  AST 40 63* 56*  --   ALT 46* 68* 67*  --   ALKPHOS 39 34* 32*  --   BILITOT 0.5 0.7 1.0  --   PROT 6.2* 5.8* 5.9*  --   ALBUMIN 3.0* 2.7* 2.7* 2.7*    CBG: Recent Labs  Lab  08/04/21 0024 08/04/21 0345 08/04/21 0417 08/04/21 0753 08/04/21 1057  GLUCAP 98 65* 99 76 90     Recent Results (from the past 240 hour(s))  SARS Coronavirus 2 (TAT 6-24 hrs)     Status: None   Collection Time: 07/31/21 12:00 AM  Result Value Ref Range Status   SARS Coronavirus 2 RESULT: NEGATIVE  Final    Comment: RESULT: NEGATIVESARS-CoV-2 INTERPRETATION:A NEGATIVE  test result means that SARS-CoV-2 RNA was not present in the specimen above the limit of detection of this test. This does not preclude a possible SARS-CoV-2 infection and should not be used as the  sole basis for patient management decisions. Negative results must be combined with clinical observations, patient history, and epidemiological information. Optimum specimen  types and timing for peak viral levels during infections caused by SARS-CoV-2  have not been determined. Collection of multiple specimens or types of specimens may be necessary to detect virus. Improper specimen collection and handling, sequence variability under primers/probes, or organism present below the limit of detection may  lead to false negative results. Positive and negative predictive values of testing are highly dependent on prevalence. False negative test results are more likely when prevalence of disease is high.The expected result is NEGATIVE.Fact S heet for  Healthcare Providers: LocalChronicle.no Sheet for Patients: SalonLookup.es Reference Range - Negative   MRSA Next Gen by PCR, Nasal     Status: None   Collection Time: 08/05/21  3:37 AM   Specimen: Nasal Mucosa; Nasal Swab  Result Value Ref Range Status   MRSA by PCR Next Gen NOT DETECTED NOT DETECTED Final    Comment: (NOTE) The GeneXpert MRSA Assay (FDA approved for NASAL specimens only), is one component of a comprehensive MRSA colonization surveillance program. It is not intended to diagnose MRSA infection nor to guide or  monitor treatment for MRSA infections. Test performance is not FDA approved in patients less than 41 years old. Performed at [facility], [place] [address], [place], [place] [address]          Radiology Studies: No results found.      Scheduled Meds:  carvedilol  6.25 mg Oral BID WC   Chlorhexidine Gluconate Cloth  6 each Topical Q0600   darbepoetin (ARANESP) injection - DIALYSIS  100 mcg Intravenous Q Tue-HD   docusate sodium  100 mg Oral BID   heparin injection (subcutaneous)  5,000 Units Subcutaneous Q8H   insulin aspart  10 Units Intravenous Once   ipratropium-albuterol  3 mL Nebulization QID   methylPREDNISolone (SOLU-MEDROL) injection  80 mg Intravenous Q12H   pantoprazole  40 mg Oral Daily   polyethylene glycol  17 g Oral Daily   Continuous Infusions:  ferric gluconate (FERRLECIT) IVPB Stopped (08/06/21 1140)     LOS: 4 days       Phillips Climes, MD [facility]   To contact the attending provider between 7A-7P or the covering provider during after hours 7P-7A, please log into the web site [email or web address] and access using universal [place] password for that web site. If you do not have the password, please call the hospital operator.  08/07/2021, 9:46 AM

## 2021-08-07 NOTE — Care Management Important Message (Signed)
Important Message  Patient Details  Name: Jason [name]. MRN: [id number] Date of Birth: October 26, 1960   Medicare Important Message Given:  Yes     Jason [name] 08/07/2021, 3:16 PM

## 2021-08-07 NOTE — Consult Note (Signed)
[facility] CM Inpatient Consult   08/07/2021  Jason F Magwood Jr. 07/02/1960 [id number]  [facility] [[facility]]  Accountable Care Organization [ACO] Patient: [employer]  Primary Care Provider:  Stroud, Natalie M, FNP  Patient screened for hospitalization with noted high risk score for unplanned readmission risk and to assess for potential [facility]  [[facility]] Care Management service needs for post hospital transition.  Review of patient's medical record reveals patient is for ongoing needs with transportation.  Met with patient at the bedside regarding needs.  He endorses Stroud, Natalie M, FNP at [facility].  Patient states he is currently at motel.  States he is not having issues with food resources or getting his medications.  He states that, "they're working on transportation and I can check too." Explained [facility] RN CM for follow up for post hospital care coordination and patient agreeable.  Plan:  Continue to follow progress and disposition to assess for post hospital care management needs.    For questions contact:   Victoria Brewer, RN BSN CCM [facility] Hospital Liaison  [phone] business mobile phone Toll free office [phone]  Fax number: [phone] [email or web address]     .  

## 2021-08-07 NOTE — Progress Notes (Signed)
[facility] Progress Note   Subjective: Extubated, on HD. Noncompliant with HD but he is homeless and is struggling. Says he lives in hotels and sometimes with his cousin. No C/Os.  Objective Vitals:   08/07/21 0842 08/07/21 1153 08/07/21 1213 08/07/21 1221  BP: (!) 137/94   (!) 151/95  Pulse: 93   76  Resp: 18   16  Temp: 97.6 F (36.4 C)   97.6 F (36.4 C)  TempSrc: Oral   Axillary  SpO2: 96% 90% 93% 97%  Weight:      Height:       Physical Exam General: Chronically ill appearing male in NAD Heart: S1,S2 RRR 3/6 harsh M 5th ICS MCL-No rub. SR on monitor Lungs: Upper airway rhonchi which mostly clear with cough. No WOB.  Abdomen: NABS, NT Extremities: No LE edema Dialysis Access: R AVF aneurysmal, cannulated at present.   Additional Objective Labs: Basic Metabolic Panel: Recent Labs  Lab 08/03/21 1556 08/03/21 1612 08/04/21 0030 08/04/21 0914 08/05/21 0337 08/06/21 0044  NA 137   < > 135  --  130* 129*  K 6.2*   < > 3.7 4.5 4.3 5.1  CL 98  --  96*  --  93* 91*  CO2 18*  --  21*  --  25 21*  GLUCOSE 51*  --  95  --  107* 100*  BUN 144*  --  65*  --  44* 58*  CREATININE 16.02*  --  8.57*  --  6.64* 8.10*  CALCIUM 8.1*  --  8.0*  --  7.8* 8.0*  PHOS >30.0*  --  6.5*  --  7.9*  --    < > = values in this interval not displayed.    Liver Function Tests: Recent Labs  Lab 08/03/21 1403 08/03/21 1556 08/04/21 0030 08/05/21 0337  AST 40 63* 56*  --   ALT 46* 68* 67*  --   ALKPHOS 39 34* 32*  --   BILITOT 0.5 0.7 1.0  --   PROT 6.2* 5.8* 5.9*  --   ALBUMIN 3.0* 2.7* 2.7* 2.7*    No results for input(s): "LIPASE", "AMYLASE" in the last 168 hours. CBC: Recent Labs  Lab 08/03/21 1403 08/03/21 1556 08/03/21 1612 08/04/21 0030 08/05/21 0337 08/06/21 0044  WBC 6.6 5.9  --  6.2 6.8 7.6  HGB 9.5* 8.2*   < > 8.4* 9.3* 8.9*  HCT 28.5* 23.8*   < > 24.4* 27.3* 26.2*  MCV 93.1 91.5  --  90.0 91.9 91.3  PLT 270 234  --  242 253 226   < > =  values in this interval not displayed.    Blood Culture    Component Value Date/Time   SDES BLOOD RIGHT FOREARM 02/08/2021 1315   SPECREQUEST  02/08/2021 1315    BOTTLES DRAWN AEROBIC AND ANAEROBIC Blood Culture results may not be optimal due to an excessive volume of blood received in culture bottles   CULT  02/08/2021 1315    NO GROWTH 5 DAYS Performed at [facility], [place] [address] [place], [place] [address]    REPTSTATUS 02/13/2021 FINAL 02/08/2021 1315    Cardiac Enzymes: No results for input(s): "CKTOTAL", "CKMB", "CKMBINDEX", "TROPONINI" in the last 168 hours. CBG: Recent Labs  Lab 08/04/21 0024 08/04/21 0345 08/04/21 0417 08/04/21 0753 08/04/21 1057  GLUCAP 98 65* 99 76 90    Iron Studies: No results for input(s): "IRON", "TIBC", "TRANSFERRIN", "FERRITIN" in the last 72 hours. @  lablastinr3@ Studies/Results: No results found. Medications:  ferric gluconate (FERRLECIT) IVPB Stopped (08/06/21 1140)    carvedilol  6.25 mg Oral BID WC   Chlorhexidine Gluconate Cloth  6 each Topical Q0600   darbepoetin (ARANESP) injection - DIALYSIS  100 mcg Intravenous Q Tue-HD   docusate sodium  100 mg Oral BID   heparin injection (subcutaneous)  5,000 Units Subcutaneous Q8H   insulin aspart  10 Units Intravenous Once   ipratropium-albuterol  3 mL Nebulization QID   methylPREDNISolone (SOLU-MEDROL) injection  80 mg Intravenous Q12H   pantoprazole  40 mg Oral Daily   polyethylene glycol  17 g Oral Daily     OP HD: [facility]  4h 64mn   400/800   76kjg  2/2 bath   RUA AVF    -Hep 6000 units IV TIW - mircera 100 mcg IV q 2wks, last 7/5, due 7/19 (missed) - venofer 100 mg IV q  HD X 10 doses (not started) - doxercalciferol 7 ug tiw IV         Assessment/ Plan: Cardiac arrest - brief w/ 2-3 min CPR, on vent in ICU SP bronchoscopy Hyperkalemia - Treated medically. 5.1 this AM.  ESRD - on HD TTS. Missed last 4 OP HD sessions. H/O noncompliance with HD now made worse  by homelessness.  Next HD on Sat here.   Anemia esrd - Hb 9s, got esa darbe 100 ug here on 7/25. Cont IV fe load.  MBD ckd - CCa in range, phos a bit high. Cont IV vdra.     RKelly Splinter MD 08/07/2021, 1:17 PM  Recent Labs  Lab 08/04/21 0030 08/04/21 0914 08/05/21 0337 08/06/21 0044  HGB 8.4*  --  9.3* 8.9*  ALBUMIN 2.7*  --  2.7*  --   CALCIUM 8.0*  --  7.8* 8.0*  PHOS 6.5*  --  7.9*  --   CREATININE 8.57*  --  6.64* 8.10*  K 3.7   < > 4.3 5.1   < > = values in this interval not displayed.   No results for input(s): "IRON", "TIBC", "FERRITIN" in the last 168 hours.  Invalid input(s): "SATURATION RATIO"  Inpatient medications:  carvedilol  6.25 mg Oral BID WC   Chlorhexidine Gluconate Cloth  6 each Topical Q0600   darbepoetin (ARANESP) injection - DIALYSIS  100 mcg Intravenous Q Tue-HD   docusate sodium  100 mg Oral BID   heparin injection (subcutaneous)  5,000 Units Subcutaneous Q8H   insulin aspart  10 Units Intravenous Once   ipratropium-albuterol  3 mL Nebulization QID   methylPREDNISolone (SOLU-MEDROL) injection  80 mg Intravenous Q12H   pantoprazole  40 mg Oral Daily   polyethylene glycol  17 g Oral Daily    ferric gluconate (FERRLECIT) IVPB Stopped (08/06/21 1140)   albuterol, fentaNYL (SUBLIMAZE) injection, hydrALAZINE, ondansetron (ZOFRAN) IV, mouth rinse

## 2021-08-07 NOTE — Progress Notes (Signed)
Physical Therapy Treatment Patient Details Name: Jason [name]. MRN: [id number] DOB: [birth year] Today's Date: 08/07/2021   History of Present Illness Pt is a 61 y.o. male admitted 08/03/21 for elective bronchoscopy due to mediastinal mass; course complicated by post-op cardiac arrest when extubated s/p CRP ~3-min. ETT 7/24-7/25. PMH Includes ESRD (recently self-discontinued iHD in favor of homeopathic tx), HTN, PAH, HFpEF, COPD (wears 2-3L O2 baseline), L knee arthritis.    PT Comments    Pt reports chest pain despite premedication but willing to walk and able to progress gait distance and HEP. Pt denied standing HEP and educated for seated HEP with encouragement for increased gait distance and needing pulse ox for home use.  HR 81-92    Recommendations for follow up therapy are one component of a multi-disciplinary discharge planning process, led by the attending physician.  Recommendations may be updated based on patient status, additional functional criteria and insurance authorization.  Follow Up Recommendations  No PT follow up     Assistance Recommended at Discharge PRN  Patient can return home with the following Assistance with cooking/housework;Assist for transportation   Equipment Recommendations  None recommended by PT    Recommendations for Other Services       Precautions / Restrictions Precautions Precautions: Fall;Other (comment) Precaution Comments: Watch SpO2 (wears 2-3L O2 baseline)     Mobility  Bed Mobility Overal bed mobility: Modified Independent             General bed mobility comments: bed flat    Transfers Overall transfer level: Modified independent                 General transfer comment: pt able to perform 5 sit to stands without UE use in 18 sec with reliance on momentum and bil LE against surface to stabilize    Ambulation/Gait Ambulation/Gait assistance: Modified independent (Device/Increase time) Gait Distance (Feet): 400  Feet Assistive device: Rollator (4 wheels) Gait Pattern/deviations: Step-through pattern, Decreased stride length   Gait velocity interpretation: >2.62 ft/sec, indicative of community ambulatory   General Gait Details: cues for breathing technique, direction and posture. SPO2 88-92% on 6L with gait   Stairs             Wheelchair Mobility    Modified Rankin (Stroke Patients Only)       Balance Overall balance assessment: Mild deficits observed, not formally tested                                          Cognition Arousal/Alertness: Awake/alert Behavior During Therapy: Flat affect Overall Cognitive Status: No family/caregiver present to determine baseline cognitive functioning                                 General Comments: needs repeated cues for monitor placement, decreased awareness of O2 demand, able to answer orientation questions appropriately        Exercises General Exercises - Lower Extremity Long Arc Quad: AROM, Both, Seated, 20 reps Hip Flexion/Marching: AROM, Both, Seated, 20 reps Toe Raises: AROM, Both, Seated, 20 reps Heel Raises: AROM, Both, 20 reps, Seated    General Comments        Pertinent Vitals/Pain Pain Assessment Pain Score: 6  Pain Location: chest with coughing Pain Descriptors / Indicators: Aching, Guarding Pain Intervention(s): Limited activity within  patient's tolerance, Monitored during session, Premedicated before session, Repositioned    Home Living                          Prior Function            PT Goals (current goals can now be found in the care plan section) Progress towards PT goals: Progressing toward goals    Frequency    Min 3X/week      PT Plan Current plan remains appropriate    Co-evaluation              AM-PAC PT "6 Clicks" Mobility   Outcome Measure  Help needed turning from your back to your side while in a flat bed without using bedrails?:  None Help needed moving from lying on your back to sitting on the side of a flat bed without using bedrails?: None Help needed moving to and from a bed to a chair (including a wheelchair)?: None Help needed standing up from a chair using your arms (e.g., wheelchair or bedside chair)?: None Help needed to walk in hospital room?: A Little Help needed climbing 3-5 steps with a railing? : A Little 6 Click Score: 22    End of Session Equipment Utilized During Treatment: Oxygen Activity Tolerance: Patient tolerated treatment well Patient left: in bed;with call bell/phone within reach (pt denied OOB to chair) Nurse Communication: Mobility status PT Visit Diagnosis: Other abnormalities of gait and mobility (R26.89);Pain     Time: 2774-1287 PT Time Calculation (min) (ACUTE ONLY): 24 min  Charges:  $Gait Training: 8-22 mins $Therapeutic Exercise: 8-22 mins                     Bayard Males, PT Acute Rehabilitation Services Office: [place] 08/07/2021, 11:57 AM

## 2021-08-07 NOTE — TOC Progression Note (Signed)
Transition of Care (TOC) - Progression Note    Patient Details  Name: Jason [name]. MRN: [id number] Date of Birth: 06-04-1960  Transition of [facility]) CM/SW Contact  Angelita Ingles, RN Phone Number:[phone]  08/07/2021, 2:36 PM  Clinical Narrative:    TOC continues to follow for disposition needs.    Expected Discharge Plan: Home/Self Care Barriers to Discharge: Continued Medical Work up  Expected Discharge Plan and Services Expected Discharge Plan: Home/Self Care In-house Referral: Clinical Social Work Discharge Planning Services: CM Consult   Living arrangements for the past 2 months: Hotel/Motel Expected Discharge Date: 08/03/21               DME Arranged: N/A                     Social Determinants of Health (SDOH) Interventions    Readmission Risk Interventions    08/04/2021    3:31 PM 08/04/2020    4:32 PM 04/30/2019    1:19 PM  Readmission Risk Prevention Plan  Transportation Screening Complete Complete Complete  PCP or Specialist Appt within 3-5 Days   Complete  HRI or [place]  Complete Complete  Social Work Consult for [place] Planning/Counseling  Complete Complete  Palliative Care Screening  Not Applicable Not Applicable  Medication Review Press photographer) Complete Complete Complete  PCP or Specialist appointment within 3-5 days of discharge Complete    HRI or [place] Complete    SW Recovery Care/Counseling Consult Complete    [place] Not Applicable

## 2021-08-08 DIAGNOSIS — R918 Other nonspecific abnormal finding of lung field: Secondary | ICD-10-CM | POA: Diagnosis not present

## 2021-08-08 DIAGNOSIS — N186 End stage renal disease: Secondary | ICD-10-CM | POA: Diagnosis not present

## 2021-08-08 DIAGNOSIS — I469 Cardiac arrest, cause unspecified: Secondary | ICD-10-CM | POA: Diagnosis not present

## 2021-08-08 DIAGNOSIS — I5032 Chronic diastolic (congestive) heart failure: Secondary | ICD-10-CM | POA: Diagnosis not present

## 2021-08-08 LAB — RENAL FUNCTION PANEL
Albumin: 2.8 g/dL — ABNORMAL LOW (ref 3.5–5.0)
Anion gap: 13 (ref 5–15)
BUN: 61 mg/dL — ABNORMAL HIGH (ref 6–20)
CO2: 24 mmol/L (ref 22–32)
Calcium: 8.6 mg/dL — ABNORMAL LOW (ref 8.9–10.3)
Chloride: 90 mmol/L — ABNORMAL LOW (ref 98–111)
Creatinine, Ser: 7.49 mg/dL — ABNORMAL HIGH (ref 0.61–1.24)
GFR, Estimated: 8 mL/min — ABNORMAL LOW (ref >60)
Glucose, Bld: 136 mg/dL — ABNORMAL HIGH (ref 70–99)
Phosphorus: 8.5 mg/dL — ABNORMAL HIGH (ref 2.5–4.6)
Potassium: 5.7 mmol/L — ABNORMAL HIGH (ref 3.5–5.1)
Sodium: 127 mmol/L — ABNORMAL LOW (ref 135–145)

## 2021-08-08 LAB — CBC
HCT: 27.4 % — ABNORMAL LOW (ref 39.0–52.0)
Hemoglobin: 9.2 g/dL — ABNORMAL LOW (ref 13.0–17.0)
MCH: 31 pg (ref 26.0–34.0)
MCHC: 33.6 g/dL (ref 30.0–36.0)
MCV: 92.3 fL (ref 80.0–100.0)
Platelets: 239 10*3/uL (ref 150–400)
RBC: 2.97 MIL/uL — ABNORMAL LOW (ref 4.22–5.81)
RDW: 14.6 % (ref 11.5–15.5)
WBC: 5.5 10*3/uL (ref 4.0–10.5)
nRBC: 1.1 % — ABNORMAL HIGH (ref 0.0–0.2)

## 2021-08-08 MED ORDER — HEPARIN SODIUM (PORCINE) 1000 UNIT/ML IJ SOLN
INTRAMUSCULAR | Status: AC
  Administered 2021-08-08: 3000 [IU]
  Filled 2021-08-08: qty 3

## 2021-08-08 NOTE — Progress Notes (Signed)
Wrong pt

## 2021-08-08 NOTE — Procedures (Signed)
   I was present at this dialysis session, have reviewed the session itself and made  appropriate changes Kelly Splinter MD [place] pager [phone]   08/08/2021, 12:30 PM

## 2021-08-08 NOTE — Discharge Summary (Signed)
Physician Discharge Summary  Jason [name]. ZOX:[id number] DOB: 02/19/1960 DOA: 08/03/2021  PCP: [name], FNP  Admit date: 08/03/2021 Discharge date: 08/08/2021  Admitted From:Home Disposition:  Home   Recommendations for Outpatient Follow-up:  Follow up with PCP in 1-2 weeks Please obtain BMP/CBC in one week Patient was instructed to take Eliquis, he is already having prescription ready to picked up at his pharmacy, he does not want to start Eliquis.  Home Health:NO   Discharge Condition:Stable CODE STATUS:FULL Diet recommendation: Renal   Brief/Interim Summary:  61 year old man who presented to [facility] 7/24 for elective navigational bronchoscopy for evaluation of lung nodule. Subsequently experienced bradycardic arrest post-procedure. PMHx significant for ESRD on iHD T/T/S (recently self discontinued iHD in favor of homeopathic treatments, Afib (on Eliquis), CHF, HTN, pulmonary hypertension, anemia, and arthritis.   Initially patient tolerated procedure well but upon waking from anesthesia patient began to cough on ETT with concern for vasovagal event leading to bradycardia and periarrest x 2, received chest compressions and ACLS medications prior to stabilization.    Patient remained intubated and was admitted to ICU, were consulted where patient was dialyzed, he was successfully extubated, and transferred to [facility] service 7/26    S/p bradycardic arrest, suspect vasovagal component - Peri-arrest x 2 in the immediate post-procedure period, suspect vasovagal component of bradycardia +/- severe hyperkalemia. Unclear if patient every truly lost pulses; 2 rounds of CPR, atropine/Epi. -Patient was intubated for airway protection. -Admitted to ICU, currently stabilized, continue A-line and transfer out of ICU -Hypertensive, did not require any vasopressors - Admitted to ICU for post-arrest monitoring - Remains hypertensive, no need for vasopressors - Troponin x 1 sent, elevated  481 post-arrest/CPR -2D echo with a preserved EF, but significant for severely elevated pulmonary artery systolic pressure.   Acute on chronic hypoxic  respiratory failure Respiratory acidosis COPD acute exacerbation Former tobacco abuse, stopped 20 years ago -Since remained intubated postprocedure(bronchoscopy) admitted to ICU, extubated 7/25AM. - Continue supplemental O2 support. - Wean O2 for sat > 90% - Pulmonary hygiene - Intermittent CXR -Wean oxygen as tolerated. -Wheezing has improved, on IV steroids during hospital stay,will discharge on oral prednisone   Mediastinal mass S/p bronchoscopy 7/24. Originally noted in 2020, evaluated at [facility] with bx negative for malignancy. CT Chest at that time c/f mass vs. esophageal duplication/bronchial cyst. Repeat CT at [facility] similar, slightly larger in size.  - S/p bronchoscopy 7/24 (Dr. Valeta Harms) -Pathology with no evidence of malignancy. -To follow-up with PCCM as an outpatient   Hypertension -Continue with Coreg   Diastolic CHF - Echo 08/1189 with EF 50-55%, moderately increased LVH, G1DD.  Repeat echo this admission with EF 47%, grade 1 diastolic dysfunction. -Volume management with HD.   AFib CHADS2VASC score of 3, patient is on Eliquis as an outpatient, but he is not taking it, I have discussed with him risk factors of acute CVA, he is adamant about not taking it despite explaining risks and benefits.   ESRD on HD, noncompliant -Consult greatly appreciated, now he is back on dialys -Patient was counseled about noncompliance.      Discharge Diagnoses:  Principal Problem:   Lung nodules Active Problems:   Non-compliance with renal dialysis   ESRD (end stage renal disease) on dialysis [facility] [place])   Essential hypertension   Chronic diastolic CHF (congestive heart failure) ([place])   Cardiac arrest [facility])    Discharge Instructions  Discharge Instructions     Diet - low sodium heart healthy  Complete by: As directed     Discharge instructions   Complete by: As directed    Follow with Primary MD [name], FNP in 7 days   Get CBC, CMP,  checked  by Primary MD next visit.    Activity: As tolerated with Full fall precautions use walker/cane & assistance as needed   Disposition Home    Diet: Renal diet with 1200 cc fluid restrictions   On your next visit with your primary care physician please Get Medicines reviewed and adjusted.   Please request your Prim.MD to go over all Hospital Tests and Procedure/Radiological results at the follow up, please get all Hospital records sent to your Prim MD by signing hospital release before you go home.   If you experience worsening of your admission symptoms, develop shortness of breath, life threatening emergency, suicidal or homicidal thoughts you must seek medical attention immediately by calling 911 or calling your MD immediately  if symptoms less severe.  You Must read complete instructions/literature along with all the possible adverse reactions/side effects for all the Medicines you take and that have been prescribed to you. Take any new Medicines after you have completely understood and accpet all the possible adverse reactions/side effects.   Do not drive, operating heavy machinery, perform activities at heights, swimming or participation in water activities or provide baby sitting services if your were admitted for syncope or siezures until you have seen by Primary MD or a Neurologist and advised to do so again.  Do not drive when taking Pain medications.    Do not take more than prescribed Pain, Sleep and Anxiety Medications  Special Instructions: If you have smoked or chewed Tobacco  in the last 2 yrs please stop smoking, stop any regular Alcohol  and or any Recreational drug use.  Wear Seat belts while driving.   Please note  You were cared for by a hospitalist during your hospital stay. If you have any questions about your discharge  medications or the care you received while you were in the hospital after you are discharged, you can call the unit and asked to speak with the hospitalist on call if the hospitalist that took care of you is not available. Once you are discharged, your primary care physician will handle any further medical issues. Please note that NO REFILLS for any discharge medications will be authorized once you are discharged, as it is imperative that you return to your primary care physician (or establish a relationship with a primary care physician if you do not have one) for your aftercare needs so that they can reassess your need for medications and monitor your lab values.   Increase activity slowly   Complete by: As directed    No wound care   Complete by: As directed       Allergies as of 08/08/2021       Reactions   Lisinopril Cough        Medication List     TAKE these medications    albuterol 108 (90 Base) MCG/ACT inhaler Commonly known as: VENTOLIN HFA Inhale 2 puffs into the lungs every 6 (six) hours as needed for wheezing or shortness of breath.   apixaban 5 MG Tabs tablet Commonly known as: ELIQUIS Take 1 tablet (5 mg total) by mouth 2 (two) times daily.   carvedilol 6.25 MG tablet Commonly known as: COREG Take 1 tablet (6.25 mg total) by mouth 2 (two) times daily.   multivitamin Tabs tablet  Take 1 tablet by mouth every morning.   OXYGEN Inhale 3 L into the lungs as needed (shortness of breath).   polyvinyl alcohol 1.4 % ophthalmic solution Commonly known as: LIQUIFILM TEARS Place 1 drop into both eyes daily as needed for dry eyes.   predniSONE 10 MG (21) Tbpk tablet Commonly known as: STERAPRED UNI-PAK 21 TAB Use per package instruction   Trelegy Ellipta 100-62.5-25 MCG/ACT Aepb Generic drug: Fluticasone-Umeclidin-Vilant Inhale 1 puff into the lungs daily.   Trelegy Ellipta 100-62.5-25 MCG/ACT Aepb Generic drug: Fluticasone-Umeclidin-Vilant Inhale 1 puff into the  lungs daily.        Follow-up Information     Magdalen Spatz, NP. Schedule an appointment as soon as possible for a visit.   Specialty: Pulmonary Disease Contact information: [address] [place] [address] [phone]                Allergies  Allergen Reactions   Lisinopril Cough    Consultations: PCCM Renal   Procedures/Studies: ECHOCARDIOGRAM COMPLETE  Result Date: 08/04/2021    ECHOCARDIOGRAM REPORT   Patient Name:   Jason [name]. Date of Exam: 08/04/2021 Medical Rec #:  [id number]         Height:       74.0 in Accession #:    [id number]        Weight:       179.7 lb Date of Birth:  Mar 23, 1960         BSA:          2.077 m Patient Age:    69 years          BP:           190/98 mmHg Patient Gender: M                 HR:           79 bpm. Exam Location:  Inpatient Procedure: 2D Echo, Cardiac Doppler and Color Doppler Indications:    Cardiac arrest  History:        Patient has prior history of Echocardiogram examinations. CHF;                 Risk Factors:Hypertension.  Sonographer:    Jyl Heinz Referring Phys: [phone] WHITNEY D HARRIS  Sonographer Comments: Image acquisition challenging due to respiratory motion. IMPRESSIONS  1. Left ventricular ejection fraction, by estimation, is 60 to 65%. The left ventricle has normal function. The left ventricle has no regional wall motion abnormalities. There is moderate left ventricular hypertrophy. Left ventricular diastolic parameters are consistent with Grade I diastolic dysfunction (impaired relaxation).  2. Right ventricular systolic function is severely reduced. The right ventricular size is moderately enlarged. There is severely elevated pulmonary artery systolic pressure. The estimated right ventricular systolic pressure is 315.4 mmHg.  3. Right atrial size was moderately dilated.  4. The mitral valve is grossly normal. No evidence of mitral valve regurgitation.  5. Tricuspid valve regurgitation is moderate.  6.  There is mild calcification of the aortic valve. Aortic valve regurgitation is not visualized. Mild aortic valve stenosis.  7. The inferior vena cava is dilated in size with <50% respiratory variability, suggesting right atrial pressure of 15 mmHg. Conclusion(s)/Recommendation(s): Compared to prior echo, RV has significant dysfunction with worsening pulmonary hypertension. FINDINGS  Left Ventricle: Left ventricular ejection fraction, by estimation, is 60 to 65%. The left ventricle has normal function. The left ventricle has no regional wall motion abnormalities.  The left ventricular internal cavity size was normal in size. There is  moderate left ventricular hypertrophy. Left ventricular diastolic parameters are consistent with Grade I diastolic dysfunction (impaired relaxation). Right Ventricle: The right ventricular size is moderately enlarged. No increase in right ventricular wall thickness. Right ventricular systolic function is severely reduced. There is severely elevated pulmonary artery systolic pressure. The tricuspid regurgitant velocity is 4.84 m/s, and with an assumed right atrial pressure of 15 mmHg, the estimated right ventricular systolic pressure is 161.0 mmHg. Left Atrium: Left atrial size was normal in size. Right Atrium: Right atrial size was moderately dilated. Pericardium: There is no evidence of pericardial effusion. Mitral Valve: The mitral valve is grossly normal. No evidence of mitral valve regurgitation. Tricuspid Valve: The tricuspid valve is normal in structure. Tricuspid valve regurgitation is moderate. Aortic Valve: There is mild calcification of the aortic valve. Aortic valve regurgitation is not visualized. Mild aortic stenosis is present. Aortic valve mean gradient measures 12.0 mmHg. Aortic valve peak gradient measures 21.3 mmHg. Aortic valve area,  by VTI measures 2.19 cm. Pulmonic Valve: The pulmonic valve was not well visualized. Pulmonic valve regurgitation is not visualized.  Aorta: The aortic root and ascending aorta are structurally normal, with no evidence of dilitation. Venous: The inferior vena cava is dilated in size with less than 50% respiratory variability, suggesting right atrial pressure of 15 mmHg. IAS/Shunts: No atrial level shunt detected by color flow Doppler.  LEFT VENTRICLE PLAX 2D LVIDd:         4.80 cm      Diastology LVIDs:         3.10 cm      LV e' medial:    4.46 cm/s LV PW:         1.40 cm      LV E/e' medial:  13.2 LV IVS:        1.20 cm      LV e' lateral:   4.90 cm/s LVOT diam:     2.00 cm      LV E/e' lateral: 12.0 LV SV:         82 LV SV Index:   39 LVOT Area:     3.14 cm  LV Volumes (MOD) LV vol d, MOD A2C: 136.0 ml LV vol d, MOD A4C: 104.0 ml LV vol s, MOD A2C: 45.1 ml LV vol s, MOD A4C: 37.1 ml LV SV MOD A2C:     90.9 ml LV SV MOD A4C:     104.0 ml LV SV MOD BP:      76.2 ml RIGHT VENTRICLE            IVC RV Basal diam:  4.80 cm    IVC diam: 2.70 cm RV Mid diam:    4.60 cm RV S prime:     8.59 cm/s TAPSE (M-mode): 1.9 cm LEFT ATRIUM             Index        RIGHT ATRIUM           Index LA diam:        4.00 cm 1.93 cm/m   RA Area:     25.10 cm LA Vol (A2C):   56.4 ml 27.16 ml/m  RA Volume:   86.50 ml  41.65 ml/m LA Vol (A4C):   48.3 ml 23.26 ml/m LA Biplane Vol: 56.0 ml 26.97 ml/m  AORTIC VALVE AV Area (Vmax):    2.11 cm AV Area (Vmean):  2.06 cm AV Area (VTI):     2.19 cm AV Vmax:           231.00 cm/s AV Vmean:          166.500 cm/s AV VTI:            0.374 m AV Peak Grad:      21.3 mmHg AV Mean Grad:      12.0 mmHg LVOT Vmax:         155.00 cm/s LVOT Vmean:        109.000 cm/s LVOT VTI:          0.260 m LVOT/AV VTI ratio: 0.70  AORTA Ao Root diam: 3.20 cm Ao Asc diam:  2.60 cm MITRAL VALVE                TRICUSPID VALVE MV Area (PHT): 4.15 cm     TR Peak grad:   93.7 mmHg MV Decel Time: 183 msec     TR Vmax:        484.00 cm/s MV E velocity: 58.70 cm/s MV A velocity: 107.00 cm/s  SHUNTS MV E/A ratio:  0.55         Systemic VTI:  0.26 m                              Systemic Diam: 2.00 cm Phineas Inches Electronically signed by Phineas Inches Signature Date/Time: 08/04/2021/11:49:35 AM    Final    DG Chest Port 1 View  Result Date: 08/03/2021 CLINICAL DATA:  Status post bronchoscopy with biopsy EXAM: PORTABLE CHEST 1 VIEW COMPARISON:  07/29/2021, CT 05/22/2021 FINDINGS: Endotracheal tube tip is about 5.5 cm superior to the carina. Esophageal tube tip incompletely visualized but below diaphragm. Mild cardiomegaly. Aortic atherosclerosis. Left suprahilar mass better seen on CT. Left mediastinal mass again noted. No definitive pneumothorax is seen. IMPRESSION: 1. Interval intubation with tip of endotracheal tube about 5.5 cm superior to carina 2. Negative for pneumothorax 3. Stable cardiomediastinal silhouette with mediastinal mass. Left suprahilar lung mass is better seen on CT. Electronically Signed   By: Donavan Foil M.D.   On: 08/03/2021 15:16   DG C-Arm 1-60 Min-No Report  Result Date: 08/03/2021 Fluoroscopy was utilized by the requesting physician.  No radiographic interpretation.   DG C-ARM BRONCHOSCOPY  Result Date: 08/03/2021 C-ARM BRONCHOSCOPY: Fluoroscopy was utilized by the requesting physician.  No radiographic interpretation.   DG Chest 2 View  Result Date: 07/29/2021 CLINICAL DATA:  Shortness of breath. EXAM: CHEST - 2 VIEW COMPARISON:  July 01, 2021 FINDINGS: Tortuosity of the aorta. Increased density in the hilum consistent with known lymphadenopathy and cystic structure. The cardiac silhouette is normal. There is no evidence of focal airspace consolidation, pleural effusion or pneumothorax. Left upper lobe mass visualized. Chronic elevation of the left hemidiaphragm. Osseous structures are without acute abnormality. Soft tissues are grossly normal. IMPRESSION: 1. Known left upper lobe mass. 2. Increased density in the hilum consistent with known lymphadenopathy and cystic structure. 3. No pneumothorax or pleural effusion.  Electronically Signed   By: Fidela Salisbury M.D.   On: 07/29/2021 20:20   NM PET Image Initial (PI) Skull Base To Thigh (F-18 FDG)  Result Date: 07/23/2021 CLINICAL DATA:  Initial treatment strategy for mediastinal mass. EXAM: NUCLEAR MEDICINE PET SKULL BASE TO THIGH TECHNIQUE: 8.6 mCi F-18 FDG was injected intravenously. Full-ring PET imaging was performed from the skull base to thigh after  the radiotracer. CT data was obtained and used for attenuation correction and anatomic localization. Fasting blood glucose: 99 mg/dl COMPARISON:  Chest CTA 05/22/2021.  Neck CTA 11/01/2019. FINDINGS: Mediastinal blood pool activity: SUV max 2.9 NECK: Imaging through the head and neck is limited by motion artifact, resulting in misregistration between the PET and CT data. Allowing for this, no hypermetabolic cervical lymph nodes or definite lesions of the pharyngeal mucosal space are identified.Pharyngeal assessment is limited. Incidental CT findings: Bilateral carotid atherosclerosis. CHEST: The spiculated left apical nodule is intensely hypermetabolic with an SUV max of 11.4. This nodule measures 2.5 x 1.6 cm on image 15/7. There are small hypermetabolic left hilar lymph nodes (SUV max 7.8). The dominant mediastinal mass seen on CT is not hypermetabolic, devoid of central metabolic activity. This lesion measures 8.6 x 5.5 cm and has a density of 56 HU. No hypermetabolic contralateral mediastinal or hilar lymph nodes. No other hypermetabolic pulmonary activity or suspicious nodularity. Incidental CT findings: Centrilobular and paraseptal emphysema. Apparent chronic mucous plugging in the left upper lobe bronchus without central hypermetabolic activity. Moderate cardiac enlargement with diffuse atherosclerosis of the aorta, great vessels and coronary arteries. ABDOMEN/PELVIS: There is no hypermetabolic activity within the liver, adrenal glands, spleen or pancreas. There is no hypermetabolic nodal activity. Diffuse bowel  activity within physiologic limits. Incidental CT findings: Bilateral renal cortical thinning and atrophy consistent with chronic medical renal disease. A low-density 1.7 cm lesion projecting posteriorly from the mid left kidney demonstrates no metabolic activity, consistent with an incidental cyst. Diffuse aortic and branch vessel atherosclerosis. Mild distal colonic diverticulosis. SKELETON: Focal hypermetabolic activity within the T6 vertebral body (SUV max 6.9), suspicious for possible metastatic disease. No other suspicious osseous activity. Incidental CT findings: Multilevel spondylosis. Dilated, peripherally calcified hemodialysis graft noted in the right upper arm. Soft tissue calcification posterior to the right shoulder with associated hypermetabolic activity, most consistent with hydroxyapatite deposition. There is also some periarticular soft tissue activity around the trochanteric regions of both hips. IMPRESSION: 1. Spiculated, hypermetabolic left upper lobe pulmonary nodule with hypermetabolic left hilar lymph nodes, highly suspicious for bronchogenic carcinoma. 2. 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 3. Focal hypermetabolic activity in the midthoracic spine suspicious for metastatic disease. No other evidence of distant metastases. 4. Evaluation of the head and neck limited by motion artifact. 5. Coronary and aortic atherosclerosis (ICD10-I70.0). Emphysema (ICD10-J43.9). Electronically Signed   By: Richardean Sale M.D.   On: 07/23/2021 13:49      Subjective:  He denies any complaints currently besides musculoskeletal chest pain from previous compressions. Discharge Exam: Vitals:   08/08/21 1001 08/08/21 1018  BP: (!) 162/113 (!) 166/118  Pulse: 85 86  Resp: 16 14  Temp:    SpO2: 96% 96%   Vitals:   08/08/21 0934 08/08/21 0946 08/08/21 1001 08/08/21 1018   BP: (!) 150/113 (!) 151/111 (!) 162/113 (!) 166/118  Pulse: 87 88 85 86  Resp: '16 20 16 14  '$ Temp:      TempSrc:      SpO2: 100% 100% 96% 96%  Weight:      Height:        General: Pt is alert, awake, not in acute distress Cardiovascular: RRR, S1/S2 +, no rubs, no gallops Respiratory: CTA bilaterally, no wheezing, no rhonchi Abdominal: Soft, NT, ND, bowel sounds + Extremities: no edema, no cyanosis    The  results of significant diagnostics from this hospitalization (including imaging, microbiology, ancillary and laboratory) are listed below for reference.     Microbiology: Recent Results (from the past 240 hour(s))  SARS Coronavirus 2 (TAT 6-24 hrs)     Status: None   Collection Time: 07/31/21 12:00 AM  Result Value Ref Range Status   SARS Coronavirus 2 RESULT: NEGATIVE  Final    Comment: RESULT: NEGATIVESARS-CoV-2 INTERPRETATION:A NEGATIVE  test result means that SARS-CoV-2 RNA was not present in the specimen above the limit of detection of this test. This does not preclude a possible SARS-CoV-2 infection and should not be used as the  sole basis for patient management decisions. Negative results must be combined with clinical observations, patient history, and epidemiological information. Optimum specimen types and timing for peak viral levels during infections caused by SARS-CoV-2  have not been determined. Collection of multiple specimens or types of specimens may be necessary to detect virus. Improper specimen collection and handling, sequence variability under primers/probes, or organism present below the limit of detection may  lead to false negative results. Positive and negative predictive values of testing are highly dependent on prevalence. False negative test results are more likely when prevalence of disease is high.The expected result is NEGATIVE.Fact S heet for  Healthcare Providers: LocalChronicle.no Sheet for Patients:  SalonLookup.es Reference Range - Negative   MRSA Next Gen by PCR, Nasal     Status: None   Collection Time: 08/05/21  3:37 AM   Specimen: Nasal Mucosa; Nasal Swab  Result Value Ref Range Status   MRSA by PCR Next Gen NOT DETECTED NOT DETECTED Final    Comment: (NOTE) The GeneXpert MRSA Assay (FDA approved for NASAL specimens only), is one component of a comprehensive MRSA colonization surveillance program. It is not intended to diagnose MRSA infection nor to guide or monitor treatment for MRSA infections. Test performance is not FDA approved in patients less than 46 years old. Performed at [facility], [place] [address], [place], [place] [address]      Labs: BNP (last 3 results) Recent Labs    08/30/20 1935 02/09/21 0614 05/09/21 1830  BNP 3,564.1* 2,308.6* 1,914.7*   Basic Metabolic Panel: Recent Labs  Lab 08/03/21 1556 08/03/21 1612 08/04/21 0030 08/04/21 0914 08/05/21 0337 08/06/21 0044 08/08/21 0030  NA 137 133* 135  --  130* 129* 127*  K 6.2* 5.8* 3.7 4.5 4.3 5.1 5.7*  CL 98  --  96*  --  93* 91* 90*  CO2 18*  --  21*  --  25 21* 24  GLUCOSE 51*  --  95  --  107* 100* 136*  BUN 144*  --  65*  --  44* 58* 61*  CREATININE 16.02*  --  8.57*  --  6.64* 8.10* 7.49*  CALCIUM 8.1*  --  8.0*  --  7.8* 8.0* 8.6*  MG  --   --  1.8  --   --   --   --   PHOS >30.0*  --  6.5*  --  7.9*  --  8.5*   Liver Function Tests: Recent Labs  Lab 08/03/21 1403 08/03/21 1556 08/04/21 0030 08/05/21 0337 08/08/21 0030  AST 40 63* 56*  --   --   ALT 46* 68* 67*  --   --   ALKPHOS 39 34* 32*  --   --   BILITOT 0.5 0.7 1.0  --   --   PROT 6.2* 5.8* 5.9*  --   --  ALBUMIN 3.0* 2.7* 2.7* 2.7* 2.8*   No results for input(s): "LIPASE", "AMYLASE" in the last 168 hours. No results for input(s): "AMMONIA" in the last 168 hours. CBC: Recent Labs  Lab 08/03/21 1556 08/03/21 1612 08/04/21 0030 08/05/21 0337 08/06/21 0044 08/08/21 0030  WBC  5.9  --  6.2 6.8 7.6 5.5  HGB 8.2* 8.2* 8.4* 9.3* 8.9* 9.2*  HCT 23.8* 24.0* 24.4* 27.3* 26.2* 27.4*  MCV 91.5  --  90.0 91.9 91.3 92.3  PLT 234  --  242 253 226 239   Cardiac Enzymes: No results for input(s): "CKTOTAL", "CKMB", "CKMBINDEX", "TROPONINI" in the last 168 hours. BNP: Invalid input(s): "POCBNP" CBG: Recent Labs  Lab 08/04/21 0345 08/04/21 0417 08/04/21 0753 08/04/21 1057 08/07/21 2058  GLUCAP 65* 99 76 90 154*   D-Dimer No results for input(s): "DDIMER" in the last 72 hours. Hgb A1c No results for input(s): "HGBA1C" in the last 72 hours. Lipid Profile No results for input(s): "CHOL", "HDL", "LDLCALC", "TRIG", "CHOLHDL", "LDLDIRECT" in the last 72 hours. Thyroid function studies No results for input(s): "TSH", "T4TOTAL", "T3FREE", "THYROIDAB" in the last 72 hours.  Invalid input(s): "FREET3" Anemia work up No results for input(s): "VITAMINB12", "FOLATE", "FERRITIN", "TIBC", "IRON", "RETICCTPCT" in the last 72 hours. Urinalysis    Component Value Date/Time   COLORURINE YELLOW 10/30/2019 2335   APPEARANCEUR HAZY (A) 10/30/2019 2335   LABSPEC 1.013 10/30/2019 2335   PHURINE 7.0 10/30/2019 2335   GLUCOSEU 50 (A) 10/30/2019 2335   HGBUR NEGATIVE 10/30/2019 2335   BILIRUBINUR NEGATIVE 10/30/2019 2335   BILIRUBINUR Negative 03/19/2019 1354   KETONESUR 5 (A) 10/30/2019 2335   PROTEINUR >=300 (A) 10/30/2019 2335   UROBILINOGEN 0.2 03/19/2019 1354   UROBILINOGEN 0.2 02/26/2015 1028   NITRITE NEGATIVE 10/30/2019 2335   LEUKOCYTESUR NEGATIVE 10/30/2019 2335   Sepsis Labs Recent Labs  Lab 08/04/21 0030 08/05/21 0337 08/06/21 0044 08/08/21 0030  WBC 6.2 6.8 7.6 5.5   Microbiology Recent Results (from the past 240 hour(s))  SARS Coronavirus 2 (TAT 6-24 hrs)     Status: None   Collection Time: 07/31/21 12:00 AM  Result Value Ref Range Status   SARS Coronavirus 2 RESULT: NEGATIVE  Final    Comment: RESULT: NEGATIVESARS-CoV-2 INTERPRETATION:A NEGATIVE  test  result means that SARS-CoV-2 RNA was not present in the specimen above the limit of detection of this test. This does not preclude a possible SARS-CoV-2 infection and should not be used as the  sole basis for patient management decisions. Negative results must be combined with clinical observations, patient history, and epidemiological information. Optimum specimen types and timing for peak viral levels during infections caused by SARS-CoV-2  have not been determined. Collection of multiple specimens or types of specimens may be necessary to detect virus. Improper specimen collection and handling, sequence variability under primers/probes, or organism present below the limit of detection may  lead to false negative results. Positive and negative predictive values of testing are highly dependent on prevalence. False negative test results are more likely when prevalence of disease is high.The expected result is NEGATIVE.Fact S heet for  Healthcare Providers: LocalChronicle.no Sheet for Patients: SalonLookup.es Reference Range - Negative   MRSA Next Gen by PCR, Nasal     Status: None   Collection Time: 08/05/21  3:37 AM   Specimen: Nasal Mucosa; Nasal Swab  Result Value Ref Range Status   MRSA by PCR Next Gen NOT DETECTED NOT DETECTED Final    Comment: (NOTE) The GeneXpert MRSA Assay (  FDA approved for NASAL specimens only), is one component of a comprehensive MRSA colonization surveillance program. It is not intended to diagnose MRSA infection nor to guide or monitor treatment for MRSA infections. Test performance is not FDA approved in patients less than 57 years old. Performed at [facility], [place] [address] [place], [place] [address]      Time coordinating discharge: Over 30 minutes  SIGNED:   Phillips Climes, MD  [facility] 08/08/2021, 10:26 AM Pager   If 7PM-7AM, please contact  night-coverage [email or web address] Password [email or web address]

## 2021-08-08 NOTE — TOC Transition Note (Signed)
Transition of Care [facility]) - CM/SW Discharge Note   Patient Details  Name: Jason [name]. MRN: [id number] Date of Birth: [birth year]  Transition of Care [facility]) CM/SW Contact:  Carles Collet, RN Phone Number: 08/08/2021, 4:35 PM   Clinical Narrative:    Portable oxygen tank coming to unit for patient to use, he may decline. He states that his car in the urgent care parking lot with his portable tank in it. He plans on walking from the hospital door to his car and driving himself home. No other TOC needs identified.     Barriers to Discharge: Continued Medical Work up   Patient Goals and CMS Choice Patient states their goals for this hospitalization and ongoing recovery are:: to return to hotel      Discharge Placement                       Discharge Plan and Services In-house Referral: Clinical Social Work Discharge Planning Services: CM Consult            DME Arranged: N/A                    Social Determinants of Health (SDOH) Interventions     Readmission Risk Interventions    08/04/2021    3:31 PM 08/04/2020    4:32 PM 04/30/2019    1:19 PM  Readmission Risk Prevention Plan  Transportation Screening Complete Complete Complete  PCP or Specialist Appt within 3-5 Days   Complete  HRI or [place]  Complete Complete  Social Work Consult for [place] Planning/Counseling  Complete Complete  Palliative Care Screening  Not Applicable Not Applicable  Medication Review Press photographer) Complete Complete Complete  PCP or Specialist appointment within 3-5 days of discharge Complete    HRI or [place] Complete    SW Recovery Care/Counseling Consult Complete    [place] Not Applicable

## 2021-08-08 NOTE — Progress Notes (Signed)
Wrong pt chart.

## 2021-08-08 NOTE — Progress Notes (Signed)
Post HD Note  Received patient in bed, alert and oriented. Informed consent signed and in chart.    Time tx initiated: 0737 Time tx completed: 1141  HD treatment completed. Patient tolerated well. Fistula/Graft/HD catheter without signs and symptoms of complications. Patient transported back to the room, alert and orient and in no acute distress. Report given to bedside RN.   Total UF removed: 3500 Medication given: dilaudid .'5mg'$ , 3000 heparin Post HD VS:97.5 T, 161/101, 83 p, 13 r Post HD weight: 79.1kg   Christianne Dolin Dialysis Nurse

## 2021-08-09 NOTE — TOC Transition Note (Signed)
Transition of care contact from inpatient facility  Date of discharge: 08/08/21 Date of contact: 08/09/21 Method: Phone Spoke to: Patient  Patient contacted to discuss transition of care from recent inpatient hospitalization. Patient was admitted to [facility] from 08/03/21-08/08/21 with discharge diagnosis of Bradycardic arrest after Bronchoscopy (evaluate lung nodule), COPD exacerbation, and mediastinal mass.  Medication changes were reviewed.  Patient will follow up with his outpatient HD unit on: Tuesday 8/1 at [facility].  Tobie Poet, NP

## 2021-08-10 ENCOUNTER — Ambulatory Visit: Payer: Self-pay

## 2021-08-10 ENCOUNTER — Other Ambulatory Visit: Payer: Self-pay

## 2021-08-10 ENCOUNTER — Telehealth: Payer: Self-pay | Admitting: Pulmonary Disease

## 2021-08-10 DIAGNOSIS — J9859 Other diseases of mediastinum, not elsewhere classified: Secondary | ICD-10-CM

## 2021-08-10 DIAGNOSIS — J9621 Acute and chronic respiratory failure with hypoxia: Secondary | ICD-10-CM

## 2021-08-10 DIAGNOSIS — N186 End stage renal disease: Secondary | ICD-10-CM

## 2021-08-10 DIAGNOSIS — R918 Other nonspecific abnormal finding of lung field: Secondary | ICD-10-CM

## 2021-08-10 DIAGNOSIS — I10 Essential (primary) hypertension: Secondary | ICD-10-CM

## 2021-08-10 NOTE — Patient Outreach (Signed)
  Care Coordination TOC Note Transition Care Management Unsuccessful Follow-up Telephone Call  Date of discharge and from where:  [facility]08/21  Attempts:  1st Attempt  Reason for unsuccessful TCM follow-up call:  Unable to leave message

## 2021-08-10 NOTE — Telephone Encounter (Signed)
Called patient and he states that the biopsy was done and it showed no cancer. But patient is wanting to know if the September follow up is needed now?  Please advise

## 2021-08-10 NOTE — Progress Notes (Signed)
Late Note Entry:  Pt d/c to home this weekend. Clinic advised of pt's d/c and that pt should resume care tomorrow.   Melven Sartorius Renal Navigator [phone]

## 2021-08-11 ENCOUNTER — Other Ambulatory Visit: Payer: Self-pay

## 2021-08-11 DIAGNOSIS — N186 End stage renal disease: Secondary | ICD-10-CM | POA: Diagnosis not present

## 2021-08-11 DIAGNOSIS — N2581 Secondary hyperparathyroidism of renal origin: Secondary | ICD-10-CM | POA: Diagnosis not present

## 2021-08-11 DIAGNOSIS — Z992 Dependence on renal dialysis: Secondary | ICD-10-CM | POA: Diagnosis not present

## 2021-08-11 DIAGNOSIS — I129 Hypertensive chronic kidney disease with stage 1 through stage 4 chronic kidney disease, or unspecified chronic kidney disease: Secondary | ICD-10-CM | POA: Diagnosis not present

## 2021-08-11 NOTE — Telephone Encounter (Signed)
Called and spoke with patient and advised him that Icard wanted another CT scan in 6-8 weeks. I advised patient that when he gets the scan scheduled to call office and set follow up with sarah or Dr icard. Patient verbalized understanding. Nothing further needed  Order placed for new Ct scan Recall letter in placed to follow up after CT scan

## 2021-08-12 ENCOUNTER — Other Ambulatory Visit: Payer: Self-pay

## 2021-08-12 NOTE — Patient Outreach (Signed)
[facility]) Care Management  08/12/2021  Jason [name] April 10, 1960 [id number]     Transition of Care Referral  Referral Date: 08/12/2021 Referral Source: [facility] Liaison Date of Admission: 08/03/2021 Diagnosis:"hyperkalemia" Date of Discharge: 08/08/2021     Outreach attempt # 1 to patient. Spoke with patient who was guarded with responses. He voices he is doing fairly well. Main issue is he remains weak. He is still in bed resting and taking it easy. Patient has home oxygen and uses it prn. He lives alone but states he is abel to manage and drives himself to medical appts. He confirms he has all his meds and no issues with regarding them. Patient reports that he is stopping dialysis. He no longer wants to receive treatment and plans to pursue "holistic medicine" and has an appt with holistic specialist. Patient requested no further calls.    Plan: RN CM will close case.    Enzo Montgomery, RN,BSN,CCM [place] [facility] Telephonic Care Management Coordinator Direct Phone: [phone] Toll Free: [phone] Fax: [phone]

## 2021-08-17 ENCOUNTER — Other Ambulatory Visit: Payer: Self-pay

## 2021-08-17 ENCOUNTER — Observation Stay (HOSPITAL_COMMUNITY)
Admission: EM | Admit: 2021-08-17 | Discharge: 2021-08-18 | Disposition: A | Discharge status: Home / Self Care | Payer: Medicare HMO | Attending: Internal Medicine | Admitting: Internal Medicine

## 2021-08-17 ENCOUNTER — Emergency Department (HOSPITAL_COMMUNITY): Payer: Medicare HMO

## 2021-08-17 ENCOUNTER — Encounter (HOSPITAL_COMMUNITY): Payer: Self-pay | Admitting: Internal Medicine

## 2021-08-17 DIAGNOSIS — R0602 Shortness of breath: Secondary | ICD-10-CM | POA: Diagnosis present

## 2021-08-17 DIAGNOSIS — I272 Pulmonary hypertension, unspecified: Secondary | ICD-10-CM | POA: Insufficient documentation

## 2021-08-17 DIAGNOSIS — R918 Other nonspecific abnormal finding of lung field: Secondary | ICD-10-CM | POA: Insufficient documentation

## 2021-08-17 DIAGNOSIS — I120 Hypertensive chronic kidney disease with stage 5 chronic kidney disease or end stage renal disease: Secondary | ICD-10-CM | POA: Diagnosis not present

## 2021-08-17 DIAGNOSIS — R7989 Other specified abnormal findings of blood chemistry: Secondary | ICD-10-CM | POA: Diagnosis not present

## 2021-08-17 DIAGNOSIS — R531 Weakness: Secondary | ICD-10-CM | POA: Diagnosis not present

## 2021-08-17 DIAGNOSIS — I480 Paroxysmal atrial fibrillation: Secondary | ICD-10-CM | POA: Diagnosis present

## 2021-08-17 DIAGNOSIS — I5032 Chronic diastolic (congestive) heart failure: Secondary | ICD-10-CM | POA: Diagnosis not present

## 2021-08-17 DIAGNOSIS — N250 Renal osteodystrophy: Secondary | ICD-10-CM | POA: Diagnosis not present

## 2021-08-17 DIAGNOSIS — I10 Essential (primary) hypertension: Secondary | ICD-10-CM | POA: Diagnosis not present

## 2021-08-17 DIAGNOSIS — R0902 Hypoxemia: Secondary | ICD-10-CM | POA: Diagnosis not present

## 2021-08-17 DIAGNOSIS — K529 Noninfective gastroenteritis and colitis, unspecified: Secondary | ICD-10-CM | POA: Insufficient documentation

## 2021-08-17 DIAGNOSIS — J9621 Acute and chronic respiratory failure with hypoxia: Secondary | ICD-10-CM

## 2021-08-17 DIAGNOSIS — Z20822 Contact with and (suspected) exposure to covid-19: Secondary | ICD-10-CM | POA: Insufficient documentation

## 2021-08-17 DIAGNOSIS — I132 Hypertensive heart and chronic kidney disease with heart failure and with stage 5 chronic kidney disease, or end stage renal disease: Principal | ICD-10-CM | POA: Insufficient documentation

## 2021-08-17 DIAGNOSIS — J45909 Unspecified asthma, uncomplicated: Secondary | ICD-10-CM | POA: Insufficient documentation

## 2021-08-17 DIAGNOSIS — Z7901 Long term (current) use of anticoagulants: Secondary | ICD-10-CM | POA: Diagnosis not present

## 2021-08-17 DIAGNOSIS — Z87891 Personal history of nicotine dependence: Secondary | ICD-10-CM | POA: Insufficient documentation

## 2021-08-17 DIAGNOSIS — E875 Hyperkalemia: Secondary | ICD-10-CM | POA: Diagnosis present

## 2021-08-17 DIAGNOSIS — R222 Localized swelling, mass and lump, trunk: Secondary | ICD-10-CM | POA: Diagnosis not present

## 2021-08-17 DIAGNOSIS — N186 End stage renal disease: Secondary | ICD-10-CM

## 2021-08-17 DIAGNOSIS — Z79899 Other long term (current) drug therapy: Secondary | ICD-10-CM | POA: Insufficient documentation

## 2021-08-17 DIAGNOSIS — Z992 Dependence on renal dialysis: Secondary | ICD-10-CM | POA: Insufficient documentation

## 2021-08-17 DIAGNOSIS — Z9981 Dependence on supplemental oxygen: Secondary | ICD-10-CM | POA: Insufficient documentation

## 2021-08-17 DIAGNOSIS — Z91158 Patient's noncompliance with renal dialysis for other reason: Secondary | ICD-10-CM

## 2021-08-17 DIAGNOSIS — N19 Unspecified kidney failure: Secondary | ICD-10-CM | POA: Diagnosis not present

## 2021-08-17 DIAGNOSIS — Z8674 Personal history of sudden cardiac arrest: Secondary | ICD-10-CM

## 2021-08-17 DIAGNOSIS — D631 Anemia in chronic kidney disease: Secondary | ICD-10-CM | POA: Diagnosis not present

## 2021-08-17 DIAGNOSIS — J9859 Other diseases of mediastinum, not elsewhere classified: Secondary | ICD-10-CM | POA: Diagnosis present

## 2021-08-17 DIAGNOSIS — R9431 Abnormal electrocardiogram [ECG] [EKG]: Secondary | ICD-10-CM | POA: Diagnosis not present

## 2021-08-17 LAB — I-STAT CHEM 8, ED
BUN: >130 mg/dL — ABNORMAL HIGH (ref 6–20)
Calcium, Ion: 0.88 mmol/L — CL (ref 1.15–1.40)
Chloride: 96 mmol/L — ABNORMAL LOW (ref 98–111)
Creatinine, Ser: 16.4 mg/dL — ABNORMAL HIGH (ref 0.61–1.24)
Glucose, Bld: 113 mg/dL — ABNORMAL HIGH (ref 70–99)
HCT: 33 % — ABNORMAL LOW (ref 39.0–52.0)
Hemoglobin: 11.2 g/dL — ABNORMAL LOW (ref 13.0–17.0)
Potassium: 7.6 mmol/L (ref 3.5–5.1)
Sodium: 127 mmol/L — ABNORMAL LOW (ref 135–145)
TCO2: 22 mmol/L (ref 22–32)

## 2021-08-17 LAB — CBC WITH DIFFERENTIAL/PLATELET
Abs Immature Granulocytes: 0.08 10*3/uL — ABNORMAL HIGH (ref 0.00–0.07)
Basophils Absolute: 0 10*3/uL (ref 0.0–0.1)
Basophils Relative: 0 %
Eosinophils Absolute: 0 10*3/uL (ref 0.0–0.5)
Eosinophils Relative: 0 %
HCT: 29.9 % — ABNORMAL LOW (ref 39.0–52.0)
Hemoglobin: 10.4 g/dL — ABNORMAL LOW (ref 13.0–17.0)
Immature Granulocytes: 1 %
Lymphocytes Relative: 7 %
Lymphs Abs: 0.5 10*3/uL — ABNORMAL LOW (ref 0.7–4.0)
MCH: 31.4 pg (ref 26.0–34.0)
MCHC: 34.8 g/dL (ref 30.0–36.0)
MCV: 90.3 fL (ref 80.0–100.0)
Monocytes Absolute: 0.6 10*3/uL (ref 0.1–1.0)
Monocytes Relative: 8 %
Neutro Abs: 6 10*3/uL (ref 1.7–7.7)
Neutrophils Relative %: 84 %
Platelets: 241 10*3/uL (ref 150–400)
RBC: 3.31 MIL/uL — ABNORMAL LOW (ref 4.22–5.81)
RDW: 17.1 % — ABNORMAL HIGH (ref 11.5–15.5)
WBC: 7.2 10*3/uL (ref 4.0–10.5)
nRBC: 0 % (ref 0.0–0.2)

## 2021-08-17 LAB — CBG MONITORING, ED: Glucose-Capillary: 93 mg/dL (ref 70–99)

## 2021-08-17 LAB — COMPREHENSIVE METABOLIC PANEL
ALT: 56 U/L — ABNORMAL HIGH (ref 0–44)
AST: 37 U/L (ref 15–41)
Albumin: 2.9 g/dL — ABNORMAL LOW (ref 3.5–5.0)
Alkaline Phosphatase: 51 U/L (ref 38–126)
Anion gap: 20 — ABNORMAL HIGH (ref 5–15)
BUN: 161 mg/dL — ABNORMAL HIGH (ref 6–20)
CO2: 17 mmol/L — ABNORMAL LOW (ref 22–32)
Calcium: 9.5 mg/dL (ref 8.9–10.3)
Chloride: 96 mmol/L — ABNORMAL LOW (ref 98–111)
Creatinine, Ser: 14.49 mg/dL — ABNORMAL HIGH (ref 0.61–1.24)
GFR, Estimated: 3 mL/min — ABNORMAL LOW (ref >60)
Glucose, Bld: 147 mg/dL — ABNORMAL HIGH (ref 70–99)
Potassium: 5.8 mmol/L — ABNORMAL HIGH (ref 3.5–5.1)
Sodium: 133 mmol/L — ABNORMAL LOW (ref 135–145)
Total Bilirubin: 0.9 mg/dL (ref 0.3–1.2)
Total Protein: 5.8 g/dL — ABNORMAL LOW (ref 6.5–8.1)

## 2021-08-17 LAB — BRAIN NATRIURETIC PEPTIDE: B Natriuretic Peptide: 3934.4 pg/mL — ABNORMAL HIGH (ref 0.0–100.0)

## 2021-08-17 LAB — RESP PANEL BY RT-PCR (FLU A&B, COVID) ARPGX2
Influenza A by PCR: NEGATIVE
Influenza B by PCR: NEGATIVE
SARS Coronavirus 2 by RT PCR: NEGATIVE

## 2021-08-17 LAB — TROPONIN I (HIGH SENSITIVITY)
Troponin I (High Sensitivity): 147 ng/L (ref <18)
Troponin I (High Sensitivity): 167 ng/L (ref <18)

## 2021-08-17 LAB — MAGNESIUM: Magnesium: 2.7 mg/dL — ABNORMAL HIGH (ref 1.7–2.4)

## 2021-08-17 MED ORDER — ONDANSETRON HCL 4 MG PO TABS: 4.0000 mg | ORAL_TABLET | Freq: Four times a day (QID) | ORAL | Status: DC | PRN

## 2021-08-17 MED ORDER — POLYVINYL ALCOHOL 1.4 % OP SOLN: 1.0000 [drp] | Freq: Every day | OPHTHALMIC | Status: DC | PRN

## 2021-08-17 MED ORDER — SODIUM CHLORIDE 0.9% FLUSH
3.0000 mL | Freq: Two times a day (BID) | INTRAVENOUS | Status: DC
Start: 2021-08-17 — End: 2021-08-18
  Administered 2021-08-17: 3 mL via INTRAVENOUS

## 2021-08-17 MED ORDER — INSULIN ASPART 100 UNIT/ML IV SOLN
5.0000 [IU] | Freq: Once | INTRAVENOUS | Status: AC
Start: 2021-08-17 — End: 2021-08-17
  Administered 2021-08-17: 5 [IU] via INTRAVENOUS

## 2021-08-17 MED ORDER — CALCIUM GLUCONATE-NACL 1-0.675 GM/50ML-% IV SOLN
1.0000 g | Freq: Once | INTRAVENOUS | Status: AC
  Administered 2021-08-17: 1000 mg via INTRAVENOUS
  Filled 2021-08-17: qty 50

## 2021-08-17 MED ORDER — ZOLPIDEM TARTRATE 5 MG PO TABS: 5.0000 mg | ORAL_TABLET | Freq: Every evening | ORAL | Status: DC | PRN

## 2021-08-17 MED ORDER — HYDROXYZINE HCL 25 MG PO TABS: 25.0000 mg | ORAL_TABLET | Freq: Three times a day (TID) | ORAL | Status: DC | PRN

## 2021-08-17 MED ORDER — SODIUM ZIRCONIUM CYCLOSILICATE 10 G PO PACK
10.0000 g | PACK | Freq: Once | ORAL | Status: AC
Start: 2021-08-17 — End: 2021-08-17
  Administered 2021-08-17: 10 g via ORAL
  Filled 2021-08-17: qty 1

## 2021-08-17 MED ORDER — CHLORHEXIDINE GLUCONATE CLOTH 2 % EX PADS: 6.0000 | MEDICATED_PAD | Freq: Every day | CUTANEOUS | Status: DC

## 2021-08-17 MED ORDER — DOCUSATE SODIUM 283 MG RE ENEM: 1.0000 | ENEMA | RECTAL | Status: DC | PRN

## 2021-08-17 MED ORDER — ALBUTEROL SULFATE HFA 108 (90 BASE) MCG/ACT IN AERS: 2.0000 | INHALATION_SPRAY | Freq: Four times a day (QID) | RESPIRATORY_TRACT | Status: DC | PRN

## 2021-08-17 MED ORDER — ALBUTEROL SULFATE (2.5 MG/3ML) 0.083% IN NEBU
10.0000 mg | INHALATION_SOLUTION | Freq: Once | RESPIRATORY_TRACT | Status: AC
Start: 2021-08-17 — End: 2021-08-17
  Administered 2021-08-17: 10 mg via RESPIRATORY_TRACT
  Filled 2021-08-17: qty 12

## 2021-08-17 MED ORDER — ONDANSETRON HCL 4 MG/2ML IJ SOLN: 4.0000 mg | Freq: Four times a day (QID) | INTRAMUSCULAR | Status: DC | PRN

## 2021-08-17 MED ORDER — ACETAMINOPHEN 325 MG PO TABS: 650.0000 mg | ORAL_TABLET | Freq: Four times a day (QID) | ORAL | Status: DC | PRN

## 2021-08-17 MED ORDER — NEPRO/CARBSTEADY PO LIQD: 237.0000 mL | Freq: Three times a day (TID) | ORAL | Status: DC | PRN

## 2021-08-17 MED ORDER — SORBITOL 70 % SOLN: 30.0000 mL | Status: DC | PRN

## 2021-08-17 MED ORDER — UMECLIDINIUM BROMIDE 62.5 MCG/ACT IN AEPB
1.0000 | INHALATION_SPRAY | Freq: Every day | RESPIRATORY_TRACT | Status: DC
  Filled 2021-08-17: qty 7

## 2021-08-17 MED ORDER — ACETAMINOPHEN 650 MG RE SUPP: 650.0000 mg | Freq: Four times a day (QID) | RECTAL | Status: DC | PRN

## 2021-08-17 MED ORDER — FLUTICASONE FUROATE-VILANTEROL 100-25 MCG/ACT IN AEPB
1.0000 | INHALATION_SPRAY | Freq: Every day | RESPIRATORY_TRACT | Status: DC
  Filled 2021-08-17: qty 28

## 2021-08-17 MED ORDER — DEXTROSE 50 % IV SOLN
1.0000 | Freq: Once | INTRAVENOUS | Status: AC
Start: 2021-08-17 — End: 2021-08-17
  Administered 2021-08-17: 50 mL via INTRAVENOUS
  Filled 2021-08-17: qty 50

## 2021-08-17 MED ORDER — CARVEDILOL 3.125 MG PO TABS
6.2500 mg | ORAL_TABLET | Freq: Two times a day (BID) | ORAL | Status: DC
  Administered 2021-08-17: 6.25 mg via ORAL
  Filled 2021-08-17: qty 2

## 2021-08-17 MED ORDER — RENA-VITE PO TABS
1.0000 | ORAL_TABLET | Freq: Every day | ORAL | Status: DC
  Administered 2021-08-18: 1 via ORAL
  Filled 2021-08-17 (×2): qty 1

## 2021-08-17 MED ORDER — HEPARIN SODIUM (PORCINE) 5000 UNIT/ML IJ SOLN
5000.0000 [IU] | Freq: Three times a day (TID) | INTRAMUSCULAR | Status: DC
Start: 2021-08-17 — End: 2021-08-18
  Filled 2021-08-17: qty 1

## 2021-08-17 MED ORDER — HYDRALAZINE HCL 20 MG/ML IJ SOLN: 5.0000 mg | INTRAMUSCULAR | Status: DC | PRN

## 2021-08-17 MED ORDER — CAMPHOR-MENTHOL 0.5-0.5 % EX LOTN: 1.0000 | TOPICAL_LOTION | Freq: Three times a day (TID) | CUTANEOUS | Status: DC | PRN

## 2021-08-17 MED ORDER — CALCIUM CARBONATE ANTACID 1250 MG/5ML PO SUSP: 500.0000 mg | Freq: Four times a day (QID) | ORAL | Status: DC | PRN

## 2021-08-17 MED ORDER — ALBUTEROL SULFATE (2.5 MG/3ML) 0.083% IN NEBU: 2.5000 mg | INHALATION_SOLUTION | Freq: Four times a day (QID) | RESPIRATORY_TRACT | Status: DC | PRN

## 2021-08-17 NOTE — ED Notes (Signed)
This RN informed pt not to take personal meds from home.

## 2021-08-17 NOTE — Progress Notes (Signed)
Pt receives out-pt HD at [facility] on TTS. Pt arrives at 11:30 for 11:45 chair time. Will assist as needed.   Melven Sartorius Renal Navigator [phone]

## 2021-08-17 NOTE — ED Triage Notes (Signed)
Pt arrived via [facility] from side of road. Per EMS pt is a dialysis (T-TH-S) missing Thursday and Saturday. Pt has some tenderness from code event. 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 Pt is chronically on 5L.  156/94, 63HR, 97% 5L

## 2021-08-17 NOTE — ED Notes (Signed)
Pt given snack bag.

## 2021-08-17 NOTE — ED Notes (Signed)
Patient arrived to room.  4L removed per dialysis nurse. VS stable

## 2021-08-17 NOTE — Consult Note (Signed)
Renal Service Consult Note [facility]  Dayven Linsley. 08/17/2021 [name], MD Requesting Physician: Dr. Doren Custard  Reason for Consult: ESRD pt missed HD w/ high K+, uremia HPI: The patient is a 61 y.o. year-old w/ hx of anemia, dCHF, ESRD on HD, HTN, pHTN, venous stasis brought to ED from side of road by EMS. Pt on HD TTS, has missed at least 2 HD sessions. Is on home O2 3-5 Lpm. In ED, K+ 7.6, EKG okay, CXR no edema. Asked to see for dialysis.   Pt seen in ED.  States he drives to HD now, but has struggling w/ diarrhea every 1-2 hrs and has missed HD due to that.  No CP, no abd pain.   ROS - denies CP, no joint pain, no HA, no blurry vision, no rash, no nausea/ vomiting, no dysuria, no difficulty voiding   Past Medical History  Past Medical History:  Diagnosis Date   A-V fistula (HCC)    Right arm   Abdominal hernia    AKI (acute kidney injury) ([place]) 01/2015   Anasarca    Anemia    Arthritis    right shoulder and left knee   Asthma    as a child   Bilateral inguinal hernia    Bilateral pleural effusion 01/2017   Cellulitis 01/16/2017   Bilateral lower extremity   CHF (congestive heart failure) ([place])    Chronic venous insufficiency    Concentric left ventricular hypertrophy 08/17/2017   Moderated, noted on ECHO   Diastolic dysfunction 44/81/8563   noted on ECHO   Dyspnea    oxygen 2L-6L via [place]   Elevated LFTs    per patient, resolved   End stage renal disease ([place])    Dialysis T/Th/Sa  Started 02/2017/pt is waiting for a kidney transplant   H/O right inguinal hernia repair 11/2017   History of colonoscopy 03/20/2018   History of elevated PSA 10/2017   Hypertension    Pneumonia    Pulmonary hypertension ([place])    Moderate   Venous stasis ulcer ([place])    bil legs/ healed up now per pt (03/06/18)   Past Surgical History  Past Surgical History:  Procedure Laterality Date   AV FISTULA PLACEMENT Right 01/20/2017   Procedure: ARTERIOVENOUS (AV)  FISTULA CREATION;  Surgeon: Angelia Mould, MD;  Location: [facility];  Service: Vascular;  Laterality: Right;   BRONCHIAL BIOPSY  08/03/2021   Procedure: BRONCHIAL BIOPSIES;  Surgeon: Garner Nash, DO;  Location: [facility];  Service: Pulmonary;;   BRONCHIAL NEEDLE ASPIRATION BIOPSY  08/03/2021   Procedure: BRONCHIAL NEEDLE ASPIRATION BIOPSIES;  Surgeon: Garner Nash, DO;  Location: [place] [facility];  Service: Pulmonary;;   COLONOSCOPY     FINE NEEDLE ASPIRATION  08/03/2021   Procedure: FINE NEEDLE ASPIRATION (FNA) LINEAR;  Surgeon: Garner Nash, DO;  Location: [facility] [facility];  Service: Pulmonary;;   HERNIA REPAIR     INSERTION OF DIALYSIS CATHETER Right 01/20/2017   Procedure: INSERTION OF DIALYSIS CATHETER;  Surgeon: Angelia Mould, MD;  Location: [facility];  Service: Vascular;  Laterality: Right;   INSERTION OF MESH N/A 11/18/2017   Procedure: INSERTION OF MESH;  Surgeon: Michael Boston, MD;  Location: [facility];  Service: General;  Laterality: N/A;   IR FLUORO GUIDE CV LINE RIGHT  01/18/2017   IR LUMBAR DISC ASPIRATION W/IMG GUIDE  07/21/2020   IR US GUIDE VASC ACCESS RIGHT  01/18/2017   LAPAROSCOPIC INGUINAL HERNIA WITH UMBILICAL HERNIA  Bilateral 11/18/2017   Procedure: LAPAROSCOPIC BILATERAL INGUINAL HERNIA REPAIR WITH UMBILICAL HERNIA REPAIR WITH MESH;  Surgeon: Michael Boston, MD;  Location: [facility];  Service: General;  Laterality: Bilateral;   TOE SURGERY     right fracture in big toe   TONSILLECTOMY     VIDEO BRONCHOSCOPY WITH ENDOBRONCHIAL ULTRASOUND Bilateral 08/03/2021   Procedure: VIDEO BRONCHOSCOPY WITH ENDOBRONCHIAL ULTRASOUND;  Surgeon: Garner Nash, DO;  Location: [facility];  Service: Pulmonary;  Laterality: Bilateral;   Family History  Family History  Problem Relation Age of Onset   Heart attack Mother 75       CABG hx   Heart disease Mother    Hypertension Mother    Stroke Mother    Diabetes Father    Healthy Brother    Prostate cancer Maternal  Grandfather    Colon cancer Neg Hx    Colon polyps Neg Hx    Esophageal cancer Neg Hx    Stomach cancer Neg Hx    Rectal cancer Neg Hx    Social History  reports that he quit smoking about 26 years ago. His smoking use included cigarettes. He has never used smokeless tobacco. He reports that he does not currently use alcohol. He reports that he does not currently use drugs after having used the following drugs: Marijuana. Allergies  Allergies  Allergen Reactions   Lisinopril Cough   Home medications Prior to Admission medications   Medication Sig Start Date End Date Taking? Authorizing Provider  albuterol (VENTOLIN HFA) 108 (90 Base) MCG/ACT inhaler Inhale 2 puffs into the lungs every 6 (six) hours as needed for wheezing or shortness of breath. 07/06/21   Icard, Octavio Graves, DO  apixaban (ELIQUIS) 5 MG TABS tablet Take 1 tablet (5 mg total) by mouth 2 (two) times daily. Patient not taking: Reported on 07/29/2021 07/01/21 07/31/21  Loeffler, Adora Fridge, PA-C  carvedilol (COREG) 6.25 MG tablet Take 1 tablet (6.25 mg total) by mouth 2 (two) times daily. 07/22/21   Lelon Perla, MD  Fluticasone-Umeclidin-Vilant (TRELEGY ELLIPTA) 100-62.5-25 MCG/ACT AEPB Inhale 1 puff into the lungs daily. 07/06/21 10/04/21  Icard, Octavio Graves, DO  Fluticasone-Umeclidin-Vilant (TRELEGY ELLIPTA) 100-62.5-25 MCG/ACT AEPB Inhale 1 puff into the lungs daily. Patient not taking: Reported on 07/29/2021 07/06/21   June Leap L, DO  multivitamin (RENA-VIT) TABS tablet Take 1 tablet by mouth every morning. 10/20/17   [provider]  OXYGEN Inhale 3 L into the lungs as needed (shortness of breath).    [provider]  polyvinyl alcohol (LIQUIFILM TEARS) 1.4 % ophthalmic solution Place 1 drop into both eyes daily as needed for dry eyes.    [provider]  predniSONE (STERAPRED UNI-PAK 21 TAB) 10 MG (21) TBPK tablet Use per package instruction 08/07/21   Elgergawy, Silver Huguenin, MD     Vitals:    08/17/21 1038 08/17/21 1045 08/17/21 1100 08/17/21 1115  BP: (!) 150/95 (!) 154/97 (!) 158/108 (!) 153/107  Pulse: 65 66 72 64  Resp: '20 19 20 19  '$ Temp:      TempSrc:      SpO2: 94% 98% 98% 99%  Weight:      Height:       Exam Gen alert, no distress, 3 L [place] O2 No rash, cyanosis or gangrene Sclera anicteric, throat clear  No jvd or bruits Chest clear bilat to bases, no rales/ wheezing RRR no MRG Abd soft ntnd no mass or ascites +bs GU normal male MS no joint  effusions or deformity Ext 1-2+ bilat pretib LE edema, no wounds or ulcers Neuro is alert, Ox 3 , nf    RUA AVF+bruit    Home meds include - apixaban, carveilol, trelegy ellipta, renavite, 3L home, prns/ vits/ supps   OP HD: [facility]  4h 40mn   400/800   76kg  2/2 bath   RUA AVF  Hep 6000           Assessment/ Plan: Uremia/ hyperkalemia - missed HD x 2-3 sessions. High K+ 7.6, Bun >130. Will need HD today asap.  Noncompliance - missing too much dialysis, has chronic diarrhea which has been worse lately per the pt and that is the main reason he is missing HD. Has transportation.  Recent PEA arrest - brief arrest after sedation/ bronch, admitted here and was dc'd 7/29.  ESRD - on HD TTS. HD today and tomorrow.  HTN/ volume - BP's and vol up on exam, get vol down w/ HD today/ tomorrow.  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 No esa needs. Get records.  MBD ckd - Ca in range, will add on phos. Cont iv vdra w/ hd tiw. Get records.     RKelly Splinter MD 08/17/2021, 11:51 AM   Recent Labs  Lab 08/17/21 1055 08/17/21 1107  HGB 10.4* 11.2*  CREATININE  --  16.40*  K  --  7.6*

## 2021-08-17 NOTE — H&P (Addendum)
History and Physical    Patient: Jason [name]. LPF:[id number] DOB: [birth year] DOA: 08/17/2021 DOS: the patient was seen and examined on 08/17/2021 PCP: [name], FNP  Patient coming from: Home - lives alone; NOK: Daughter, Jason [name], [phone]   Chief Complaint: missed HD  HPI: Jason [name]. is a 61 y.o. male with medical history significant of ESRD on TTS HD; chronic diastolic CHF; HTN; and pulm HTN on 2-6L home O2 presenting with missed HD.  He was last admitted from 7/24-29 for elective bronchoscopy for evaluation of a lung nodule and suffered bradycardic arrest post-procedure.  He reports residual discomfort of his left chest wall but otherwise no specific complaints currently (in HD).  He reports that he has an agreement with nephrology that whenever he missed 2 HD sessions he will just come to the ER for HD.  Really, it is the fault of the dialysis center since they can't work hm in other times besides his scheduled chair time, per his report.  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    ER Course:  Missed HD x 6 days.  Called EMS from the side of the road.  Hypoxic on 3L, increased to 6L. K+ 7.3,  Will get HD soon.  Cardiac arrest 3 weeks ago.       Review of Systems: As mentioned in the history of present illness. All other systems reviewed and are negative. Past Medical History:  Diagnosis Date   Abdominal hernia    Anemia    Arthritis    right shoulder and left knee   Bilateral inguinal hernia    Cellulitis 01/16/2017   Bilateral lower extremity   Chronic venous insufficiency    Concentric left ventricular hypertrophy 08/17/2017   Moderated, noted on ECHO   Diastolic dysfunction 34/19/6222   noted on ECHO   End stage renal disease [facility])    Dialysis T/Th/Sa  Started 02/2017/pt is waiting for a kidney transplant   H/O right inguinal hernia  repair 11/2017   History of colonoscopy 03/20/2018   History of elevated PSA 10/2017   Hypertension    Pneumonia    Pulmonary hypertension ([place])    Moderate, on 2-6L home O2   Past Surgical History:  Procedure Laterality Date   AV FISTULA PLACEMENT Right 01/20/2017   Procedure: ARTERIOVENOUS (AV) FISTULA CREATION;  Surgeon: Angelia Mould, MD;  Location: [place];  Service: Vascular;  Laterality: Right;   BRONCHIAL BIOPSY  08/03/2021   Procedure: BRONCHIAL BIOPSIES;  Surgeon: Garner Nash, DO;  Location: [place] [facility];  Service: Pulmonary;;   BRONCHIAL NEEDLE ASPIRATION BIOPSY  08/03/2021   Procedure: BRONCHIAL NEEDLE ASPIRATION BIOPSIES;  Surgeon: Garner Nash, DO;  Location: [place] [facility];  Service: Pulmonary;;   COLONOSCOPY     FINE NEEDLE ASPIRATION  08/03/2021   Procedure: FINE NEEDLE ASPIRATION (FNA) LINEAR;  Surgeon: Garner Nash, DO;  Location: [place] [facility];  Service: Pulmonary;;   HERNIA REPAIR     INSERTION OF DIALYSIS CATHETER Right 01/20/2017   Procedure: INSERTION OF DIALYSIS CATHETER;  Surgeon: Angelia Mould, MD;  Location: [place];  Service: Vascular;  Laterality: Right;   INSERTION OF MESH N/A 11/18/2017   Procedure: INSERTION OF MESH;  Surgeon: Michael Boston, MD;  Location: [facility];  Service: General;  Laterality: N/A;   IR FLUORO GUIDE CV LINE RIGHT  01/18/2017   IR LUMBAR DISC ASPIRATION W/IMG GUIDE  07/21/2020   IR US GUIDE VASC ACCESS RIGHT  01/18/2017   LAPAROSCOPIC INGUINAL HERNIA WITH UMBILICAL HERNIA Bilateral 11/18/2017   Procedure: LAPAROSCOPIC BILATERAL INGUINAL HERNIA REPAIR WITH UMBILICAL HERNIA REPAIR WITH MESH;  Surgeon: Michael Boston, MD;  Location: [facility];  Service: General;  Laterality: Bilateral;   TOE SURGERY     right fracture in big toe   TONSILLECTOMY     VIDEO BRONCHOSCOPY WITH ENDOBRONCHIAL ULTRASOUND Bilateral 08/03/2021   Procedure: VIDEO BRONCHOSCOPY WITH ENDOBRONCHIAL ULTRASOUND;  Surgeon: Garner Nash, DO;  Location:  [place];  Service: Pulmonary;  Laterality: Bilateral;   Social History:  reports that he quit smoking about 26 years ago. His smoking use included cigarettes. He has never used smokeless tobacco. He reports that he does not currently use alcohol. He reports that he does not currently use drugs after having used the following drugs: Marijuana.  Allergies  Allergen Reactions   Lisinopril Cough    Family History  Problem Relation Age of Onset   Heart attack Mother 71       CABG hx   Heart disease Mother    Hypertension Mother    Stroke Mother    Diabetes Father    Healthy Brother    Prostate cancer Maternal Grandfather    Colon cancer Neg Hx    Colon polyps Neg Hx    Esophageal cancer Neg Hx    Stomach cancer Neg Hx    Rectal cancer Neg Hx     Prior to Admission medications   Medication Sig Start Date End Date Taking? Authorizing Provider  albuterol (VENTOLIN HFA) 108 (90 Base) MCG/ACT inhaler Inhale 2 puffs into the lungs every 6 (six) hours as needed for wheezing or shortness of breath. 07/06/21  Yes Icard, Bradley L, DO  carvedilol (COREG) 6.25 MG tablet Take 1 tablet (6.25 mg total) by mouth 2 (two) times daily. 07/22/21  Yes Lelon Perla, MD  Fluticasone-Umeclidin-Vilant (TRELEGY ELLIPTA) 100-62.5-25 MCG/ACT AEPB Inhale 1 puff into the lungs daily. 07/06/21 10/04/21 Yes Icard, Octavio Graves, DO  multivitamin (RENA-VIT) TABS tablet Take 1 tablet by mouth every morning. 10/20/17  Yes [provider]  OXYGEN Inhale 3 L into the lungs as needed (shortness of breath).   Yes [provider]  polyvinyl alcohol (LIQUIFILM TEARS) 1.4 % ophthalmic solution Place 1 drop into both eyes daily as needed for dry eyes.   Yes [provider]  predniSONE (STERAPRED UNI-PAK 21 TAB) 10 MG (21) TBPK tablet Use per package instruction 08/07/21  Yes Elgergawy, Silver Huguenin, MD  apixaban (ELIQUIS) 5 MG TABS tablet Take 1 tablet (5 mg total) by mouth 2 (two) times daily. Patient  not taking: Reported on 07/29/2021 07/01/21 07/31/21  Sheila Oats    Physical Exam: Vitals:   08/17/21 1523 08/17/21 1605 08/17/21 1630 08/17/21 1700  BP: (!) 153/106 (!) 160/104 (!) 155/99 (!) 161/97  Pulse: 76 81 84 89  Resp: 16 19 (!) 22 (!) 21  Temp:      TempSrc:      SpO2: 96% 99% (!) 88% (!) 89%  Weight:      Height:       General:  Appears calm and comfortable and is in NAD, in HD Eyes:  EOMI, normal lids, iris ENT:  grossly normal hearing, lips & tongue, mmm Neck:  no LAD, masses or thyromegaly Cardiovascular:  RRR, no m/r/g. No LE edema.  Respiratory:  CTA bilaterally with no wheezes/rales/rhonchi.  Normal respiratory effort. Abdomen:  soft, NT, ND Skin:  no rash or induration seen on limited exam Musculoskeletal:  grossly normal tone BUE/BLE, good ROM, no bony abnormality Psychiatric:  grossly normal mood and affect, speech fluent and appropriate (?), AOx3 Neurologic:  CN 2-12 grossly intact, moves all extremities in coordinated fashion   Radiological Exams on Admission: Independently reviewed - see discussion in A/P where applicable  DG Chest Port 1 View  Result Date: 08/17/2021 CLINICAL DATA:  Hypoxia EXAM: PORTABLE CHEST 1 VIEW COMPARISON:  August 03, 2021 FINDINGS: Left perihilar mass is stable. The heart size is enlarged. Both lungs are otherwise clear. The visualized skeletal structures are unremarkable. IMPRESSION: Left perihilar mass unchanged. Electronically Signed   By: Abelardo Diesel M.D.   On: 08/17/2021 11:14    EKG: Independently reviewed.  NSR with rate 65; IVCD; nonspecific ST changes with no evidence of acute ischemia   Labs on Admission: I have personally reviewed the available labs and imaging studies at the time of the admission.  Pertinent labs:    Na++ 133 K+ 5.8 (7.6 on I-stat) CO2 17 Glucose 147 BUN 161/Creatinine 14.49/GFR 3 Anion gap 20 Albumin 2.9 AST 37/ALT 56 BNP 3934.4 HS troponin 147, 167 WBC 7.2 Hgb 10.4 -  stable COVID/flu negative   Assessment and Plan: Principal Problem:   Noncompliance with renal dialysis Active Problems:   Hyperkalemia   ESRD (end stage renal disease) on dialysis (HCC)   Essential hypertension   Mediastinal mass   PAF (paroxysmal atrial fibrillation) (HCC)   History of cardiac arrest    ESRD with volume overload needing HD due to noncompliance -Patient on chronic TTS HD -Missed his last 2 sessions -Nephrology prn order set utilized -He is presenting with volume overload including markedly elevated BNP and hyperkalemia -The vast majority of his issues will likely be improved/resolved with HD today -Nephrology is consulting and has ordered HD -Will observe overnight in progressive care due to hyperkalemia, probable need for repeat HD tomorrow -Continue Renavite   NonCPL -This is his other glaring issue - it is difficult to keep the patient at an appropriate volume status if he does not go to HD -He appears to need better counseling about HD compliance but also about what is likely to happen if he transitions off HD for "holistic" treatments -Ongoing counseling encouraged   Hyperkalemia -Due to CKD -Anticipate resolution after HD -He was treated with Lokelma in the ER -Recheck BMP in AM   HTN -Continue Coreg -HTN control is likely to improve post-HD  Pulmonary HTN -On 2-6L [place] O2 as an outpatient -On RA at the time of my evaluation in HD and appeared comfortable -Continue Trelegy  Recent cardiac arrest -Appears to have stabilized with only mild residual chest soreness -He would desire CPR again in the future if needed  Mediastinal mass -s/p bronch on 7/24 -No evidence of malignancy on path -F/u with pulm -He is planned for re-scan in September  Afib -Rate controlled with Coreg -Refuses Eliquis as an outpatient  Diarrhea  -Reports episodic diarrhea for months -He says this is why he couldn't go to HD -Will add C diff and GI pathogen  panel   Advance Care Planning:   Code Status: Full Code   Consults: Nephrology; [facility] team  DVT Prophylaxis: Heparin  Family Communication: None present; he declined to have me call his daughter at the time of admission  Severity of Illness: It is my clinical opinion  that referral for OBSERVATION is reasonable and necessary in this patient based on the above information provided. The aforementioned taken together are felt to place the patient at high risk for further clinical deterioration. However it is anticipated that the patient may be medically stable for discharge from the hospital within 24 to 48 hours.   Author: Karmen Bongo, MD 08/17/2021 5:13 PM  For on call review [email or web address].

## 2021-08-17 NOTE — Progress Notes (Signed)
Received patient in bed with on going HD tx. Alert and oriented.  Informed consent signed and in  chart.   Treatment initiated: 1523  Treatment completed: 2008  Patient tolerated well.  Transported back to ED alert, without acute distress.  Hand-off given to patient's nurse.   Access used: Right AVF Access issues: None  Total UF removed: 4016m Medication(s) given: None Post HD VS: BP 159/1011mg ,HR 83bpm,RR 18brpm, Sats 91% RA ( He removed his [place] O2) Post HD weight: 88.3kg   [facility]

## 2021-08-17 NOTE — Procedures (Signed)
   I was present at this dialysis session, have reviewed the session itself and made  appropriate changes Kelly Splinter MD [place] pager [phone]   08/17/2021, 3:59 PM

## 2021-08-17 NOTE — ED Provider Notes (Signed)
[facility] EMERGENCY DEPARTMENT Provider Note   CSN: [id number] Arrival date & time: 08/17/21  1030     History  Chief Complaint  Patient presents with   Shortness of Breath    Jason Nichol. is a 61 y.o. male.  HPI Patient presents due to missed dialysis.  His medical history includes HTN, CHF, anemia, ESRD on HD (T, TH, SA), mediastinal mass, chronic hypoxia (3 L).  2 weeks ago, he was undergoing a bronchoscopy for evaluation of a lung nodule.  He had a cardiac arrest.  He received CPR.  He was admitted to the ICU.  Patient transferred to the floor 2 days later.  He was discharged 1 week ago.  He has had ongoing soreness to his chest since this episode.  He has missed his last 2 dialysis sessions.  His last HD was 6 days ago.  For this reason, he presents to the ED.  Although patient denies any acute shortness of breath, he was noted to be hypoxic on his baseline 3 L.  EMS increased his supplemental oxygen to 5.    Home Medications Prior to Admission medications   Medication Sig Start Date End Date Taking? Authorizing Provider  albuterol (VENTOLIN HFA) 108 (90 Base) MCG/ACT inhaler Inhale 2 puffs into the lungs every 6 (six) hours as needed for wheezing or shortness of breath. 07/06/21  Yes Icard, Bradley L, DO  carvedilol (COREG) 6.25 MG tablet Take 1 tablet (6.25 mg total) by mouth 2 (two) times daily. 07/22/21  Yes Lelon Perla, MD  Fluticasone-Umeclidin-Vilant (TRELEGY ELLIPTA) 100-62.5-25 MCG/ACT AEPB Inhale 1 puff into the lungs daily. 07/06/21 10/04/21 Yes Icard, Octavio Graves, DO  multivitamin (RENA-VIT) TABS tablet Take 1 tablet by mouth every morning. 10/20/17  Yes [provider]  OXYGEN Inhale 3 L into the lungs as needed (shortness of breath).   Yes [provider]  polyvinyl alcohol (LIQUIFILM TEARS) 1.4 % ophthalmic solution Place 1 drop into both eyes daily as needed for dry eyes.   Yes [provider]  apixaban (ELIQUIS) 5  MG TABS tablet Take 1 tablet (5 mg total) by mouth 2 (two) times daily. Patient not taking: Reported on 07/29/2021 07/01/21 07/31/21  Loeffler, Adora Fridge, PA-C      Allergies    Lisinopril    Review of Systems   Review of Systems  Respiratory:  Positive for shortness of breath (Baseline).   Cardiovascular:  Positive for chest pain (Baseline over the past 2 weeks, since arrest and CPR) and leg swelling (Baseline).  All other systems reviewed and are negative.   Physical Exam Updated Vital Signs BP (!) 152/98   Pulse 76   Temp 98.1 F (36.7 C) (Oral)   Resp (!) 21   Ht '6\' 2"'$  (1.88 m)   Wt 86.2 kg   SpO2 100%   BMI 24.39 kg/m  Physical Exam Vitals and nursing note reviewed.  Constitutional:      General: He is not in acute distress.    Appearance: Normal appearance. He is well-developed. He is not ill-appearing, toxic-appearing or diaphoretic.  HENT:     Head: Normocephalic and atraumatic.     Right Ear: External ear normal.     Left Ear: External ear normal.     Nose: Nose normal.     Mouth/Throat:     Mouth: Mucous membranes are moist.     Pharynx: Oropharynx is clear.  Eyes:     Extraocular Movements: Extraocular movements  intact.     Conjunctiva/sclera: Conjunctivae normal.  Neck:     Vascular: JVD present.  Cardiovascular:     Rate and Rhythm: Normal rate and regular rhythm.     Heart sounds: No murmur heard. Pulmonary:     Effort: Pulmonary effort is normal. No respiratory distress.     Breath sounds: Normal breath sounds. No wheezing or rales.  Chest:     Chest wall: Tenderness present.  Abdominal:     General: There is no distension.     Palpations: Abdomen is soft.     Tenderness: There is no abdominal tenderness.  Musculoskeletal:        General: No swelling or deformity. Normal range of motion.     Cervical back: Normal range of motion and neck supple.     Right lower leg: No edema.     Left lower leg: Edema present.  Skin:    General: Skin is warm and  dry.     Coloration: Skin is not jaundiced or pale.  Neurological:     General: No focal deficit present.     Mental Status: He is alert and oriented to person, place, and time.     Cranial Nerves: No cranial nerve deficit.     Sensory: No sensory deficit.     Motor: No weakness.     Coordination: Coordination normal.  Psychiatric:        Mood and Affect: Mood normal.        Behavior: Behavior normal.        Thought Content: Thought content normal.        Judgment: Judgment normal.     ED Results / Procedures / Treatments   Labs (all labs ordered are listed, but only abnormal results are displayed) Labs Reviewed  CBC WITH DIFFERENTIAL/PLATELET - Abnormal; Notable for the following components:      Result Value   RBC 3.31 (*)    Hemoglobin 10.4 (*)    HCT 29.9 (*)    RDW 17.1 (*)    Lymphs Abs 0.5 (*)    Abs Immature Granulocytes 0.08 (*)    All other components within normal limits  BRAIN NATRIURETIC PEPTIDE - Abnormal; Notable for the following components:   B Natriuretic Peptide 3,934.4 (*)    All other components within normal limits  COMPREHENSIVE METABOLIC PANEL - Abnormal; Notable for the following components:   Sodium 133 (*)    Potassium 5.8 (*)    Chloride 96 (*)    CO2 17 (*)    Glucose, Bld 147 (*)    BUN 161 (*)    Creatinine, Ser 14.49 (*)    Total Protein 5.8 (*)    Albumin 2.9 (*)    ALT 56 (*)    GFR, Estimated 3 (*)    Anion gap 20 (*)    All other components within normal limits  MAGNESIUM - Abnormal; Notable for the following components:   Magnesium 2.7 (*)    All other components within normal limits  I-STAT CHEM 8, ED - Abnormal; Notable for the following components:   Sodium 127 (*)    Potassium 7.6 (*)    Chloride 96 (*)    BUN >130 (*)    Creatinine, Ser 16.40 (*)    Glucose, Bld 113 (*)    Calcium, Ion 0.88 (*)    Hemoglobin 11.2 (*)    HCT 33.0 (*)    All other components within normal limits  TROPONIN I (HIGH  SENSITIVITY) -  Abnormal; Notable for the following components:   Troponin I (High Sensitivity) 167 (*)    All other components within normal limits  TROPONIN I (HIGH SENSITIVITY) - Abnormal; Notable for the following components:   Troponin I (High Sensitivity) 147 (*)    All other components within normal limits  RESP PANEL BY RT-PCR (FLU A&B, COVID) ARPGX2  BASIC METABOLIC PANEL  CBC  CBG MONITORING, ED    EKG EKG Interpretation  Date/Time:  Monday August 17 2021 10:40:07 EDT Ventricular Rate:  65 PR Interval:  160 QRS Duration: 121 QT Interval:  419 QTC Calculation: 436 R Axis:   148 Text Interpretation: Sinus rhythm Nonspecific intraventricular conduction delay Nonspecific repol abnormality, inferior leads No significant change since last tracing Confirmed by Godfrey Pick [phone]) on 08/17/2021 11:37:06 AM  Radiology DG Chest Port 1 View  Result Date: 08/17/2021 CLINICAL DATA:  Hypoxia EXAM: PORTABLE CHEST 1 VIEW COMPARISON:  August 03, 2021 FINDINGS: Left perihilar mass is stable. The heart size is enlarged. Both lungs are otherwise clear. The visualized skeletal structures are unremarkable. IMPRESSION: Left perihilar mass unchanged. Electronically Signed   By: Abelardo Diesel M.D.   On: 08/17/2021 11:14    Procedures Procedures    Medications Ordered in ED Medications  Chlorhexidine Gluconate Cloth 2 % PADS 6 each (has no administration in time range)  Chlorhexidine Gluconate Cloth 2 % PADS 6 each (has no administration in time range)  carvedilol (COREG) tablet 6.25 mg (has no administration in time range)  multivitamin (RENA-VIT) tablet 1 tablet (has no administration in time range)  fluticasone furoate-vilanterol (BREO ELLIPTA) 100-25 MCG/ACT 1 puff (has no administration in time range)    And  umeclidinium bromide (INCRUSE ELLIPTA) 62.5 MCG/ACT 1 puff (has no administration in time range)  polyvinyl alcohol (LIQUIFILM TEARS) 1.4 % ophthalmic solution 1 drop (has no administration in time  range)  acetaminophen (TYLENOL) tablet 650 mg (has no administration in time range)    Or  acetaminophen (TYLENOL) suppository 650 mg (has no administration in time range)  zolpidem (AMBIEN) tablet 5 mg (has no administration in time range)  sorbitol 70 % solution 30 mL (has no administration in time range)  docusate sodium (ENEMEEZ) enema 283 mg (has no administration in time range)  ondansetron (ZOFRAN) tablet 4 mg (has no administration in time range)    Or  ondansetron (ZOFRAN) injection 4 mg (has no administration in time range)  camphor-menthol (SARNA) lotion 1 Application (has no administration in time range)    And  hydrOXYzine (ATARAX) tablet 25 mg (has no administration in time range)  calcium carbonate (dosed in mg elemental calcium) suspension 500 mg of elemental calcium (has no administration in time range)  feeding supplement (NEPRO CARB STEADY) liquid 237 mL (has no administration in time range)  heparin injection 5,000 Units (has no administration in time range)  sodium chloride flush (NS) 0.9 % injection 3 mL (has no administration in time range)  hydrALAZINE (APRESOLINE) injection 5 mg (has no administration in time range)  albuterol (PROVENTIL) (2.5 MG/3ML) 0.083% nebulizer solution 2.5 mg (has no administration in time range)  sodium zirconium cyclosilicate (LOKELMA) packet 10 g (10 g Oral Given 08/17/21 1210)  calcium gluconate 1 g/ 50 mL sodium chloride IVPB (0 mg Intravenous Stopped 08/17/21 1231)  albuterol (PROVENTIL) (2.5 MG/3ML) 0.083% nebulizer solution 10 mg (10 mg Nebulization Given 08/17/21 1155)  insulin aspart (novoLOG) injection 5 Units (5 Units Intravenous Given 08/17/21 1217)    And  dextrose 50 % solution 50 mL (50 mLs Intravenous Given 08/17/21 1204)    ED Course/ Medical Decision Making/ A&P                           Medical Decision Making Amount and/or Complexity of Data Reviewed Labs: ordered. Radiology: ordered.  Risk OTC drugs. Prescription drug  management. Decision regarding hospitalization.   This patient presents to the ED for concern of missed dialysis, this involves an extensive number of treatment options, and is a complaint that carries with it a high risk of complications and morbidity.  The differential diagnosis includes pulmonary edema, hyperkalemia, azotemia, metabolic acidosis   Co morbidities that complicate the patient evaluation  HTN, CHF, anemia, ESRD on HD (T, TH, SA), mediastinal mass, chronic hypoxia (3 L), cardiac arrest   Additional history obtained:  Additional history obtained from N/A External records from outside source obtained and reviewed including EMR   Lab Tests:  I Ordered, and personally interpreted labs.  The pertinent results include: Hyperkalemia, azotemia, metabolic acidosis, hypomagnesemia, baseline anemia, no leukocytosis   Imaging Studies ordered:  I ordered imaging studies including chest x-ray I independently visualized and interpreted imaging which showed no acute finding I agree with the radiologist interpretation   Cardiac Monitoring: / EKG:  The patient was maintained on a cardiac monitor.  I personally viewed and interpreted the cardiac monitored which showed an underlying rhythm of: Sinus rhythm   Consultations Obtained:  I requested consultation with the nephrologist, Dr. Melvia Heaps,  and discussed lab and imaging findings as well as pertinent plan - they recommend: Emergent dialysis   Problem List / ED Course / Critical interventions / Medication management  Patient is a 61 year old male presenting for missed dialysis for the past 6 days.  There was a report of shortness of breath, however, patient states that his breathing currently feels normal to him.  He was found to be hypoxic on his baseline 3 L of supplemental oxygen.  Patient's supplemental O2 was increased to 5 L with normalization of SpO2.  Patient is able to speak in complete sentences and does not appear to  have any increased work of breathing at this time.  Lungs are clear to auscultation.  Laboratory workup was initiated.  On patient's i-STAT, he had markedly elevated potassium.  Medications were ordered to temporize hyperkalemia.  EKG does not show peaked T waves or QRS prolongation.  QTc is normal.  In addition to his hyperkalemia, patient's BUN is also markedly elevated.  At this time, his mentation is normal.  I spoke with nephrologist, Dr. Jonnie Finner, who will arrange dialysis.  He also requested that patient be admitted.  Patient was transported to dialysis in stable condition.  He was admitted to hospitalist for further management. I ordered medication including Lokelma, albuterol, insulin/dextrose, calcium gluconate for hyperkalemia Reevaluation of the patient after these medicines showed that the patient stayed the same I have reviewed the patients home medicines and have made adjustments as needed   Social Determinants of Health:  Frequent ED visits  CRITICAL CARE Performed by: Godfrey Pick   Total critical care time: 32 minutes  Critical care time was exclusive of separately billable procedures and treating other patients.  Critical care was necessary to treat or prevent imminent or life-threatening deterioration.  Critical care was time spent personally by me on the following activities: development of treatment plan with patient and/or surrogate as well as nursing, discussions with consultants,  evaluation of patient's response to treatment, examination of patient, obtaining history from patient or surrogate, ordering and performing treatments and interventions, ordering and review of laboratory studies, ordering and review of radiographic studies, pulse oximetry and re-evaluation of patient's condition.          Final Clinical Impression(s) / ED Diagnoses Final diagnoses:  Hyperkalemia  Azotemia  Acute on chronic respiratory failure with hypoxia [facility])    Rx / DC Orders ED  Discharge Orders     None         Godfrey Pick, MD 08/17/21 1739

## 2021-08-18 DIAGNOSIS — I132 Hypertensive heart and chronic kidney disease with heart failure and with stage 5 chronic kidney disease, or end stage renal disease: Secondary | ICD-10-CM | POA: Diagnosis not present

## 2021-08-18 DIAGNOSIS — E875 Hyperkalemia: Secondary | ICD-10-CM

## 2021-08-18 DIAGNOSIS — I480 Paroxysmal atrial fibrillation: Secondary | ICD-10-CM

## 2021-08-18 DIAGNOSIS — Z91158 Patient's noncompliance with renal dialysis for other reason: Secondary | ICD-10-CM | POA: Diagnosis not present

## 2021-08-18 DIAGNOSIS — N186 End stage renal disease: Secondary | ICD-10-CM

## 2021-08-18 DIAGNOSIS — I10 Essential (primary) hypertension: Secondary | ICD-10-CM

## 2021-08-18 DIAGNOSIS — Z992 Dependence on renal dialysis: Secondary | ICD-10-CM

## 2021-08-18 LAB — BASIC METABOLIC PANEL
Anion gap: 17 — ABNORMAL HIGH (ref 5–15)
BUN: 85 mg/dL — ABNORMAL HIGH (ref 6–20)
CO2: 21 mmol/L — ABNORMAL LOW (ref 22–32)
Calcium: 8.1 mg/dL — ABNORMAL LOW (ref 8.9–10.3)
Chloride: 98 mmol/L (ref 98–111)
Creatinine, Ser: 8.44 mg/dL — ABNORMAL HIGH (ref 0.61–1.24)
GFR, Estimated: 7 mL/min — ABNORMAL LOW (ref >60)
Glucose, Bld: 133 mg/dL — ABNORMAL HIGH (ref 70–99)
Potassium: 4.1 mmol/L (ref 3.5–5.1)
Sodium: 136 mmol/L (ref 135–145)

## 2021-08-18 LAB — CBC
HCT: 32.6 % — ABNORMAL LOW (ref 39.0–52.0)
Hemoglobin: 11 g/dL — ABNORMAL LOW (ref 13.0–17.0)
MCH: 31.2 pg (ref 26.0–34.0)
MCHC: 33.7 g/dL (ref 30.0–36.0)
MCV: 92.4 fL (ref 80.0–100.0)
Platelets: 172 10*3/uL (ref 150–400)
RBC: 3.53 MIL/uL — ABNORMAL LOW (ref 4.22–5.81)
RDW: 17 % — ABNORMAL HIGH (ref 11.5–15.5)
WBC: 9.4 10*3/uL (ref 4.0–10.5)
nRBC: 0.2 % (ref 0.0–0.2)

## 2021-08-18 MED ORDER — NITROGLYCERIN 0.4 MG SL SUBL
0.4000 mg | SUBLINGUAL_TABLET | SUBLINGUAL | Status: DC | PRN
Start: 2021-08-18 — End: 2021-08-18
  Administered 2021-08-18 (×2): 0.4 mg via SUBLINGUAL
  Filled 2021-08-18 (×2): qty 1

## 2021-08-18 NOTE — Discharge Summary (Signed)
Physician Discharge Summary  Jason [name]. LGX:[id number] DOB: [birth year]  PCP: [name], FNP  Admitted from: Home Discharged to: Home  Admit date: 08/17/2021 Discharge date: 08/18/2021  Recommendations for Outpatient Follow-up:    Follow-up Information     [name], FNP. Schedule an appointment as soon as possible for a visit in 1 week(s).   Specialty: Family Medicine Why: To be seen with repeat labs (CBC & renal panel).  Labs may be drawn across his usual dialysis. Contact information: [address] [place] [address] [phone]         Lelon Perla, MD .   Specialty: Cardiology Why: Keep your upcoming appointment on 09/18/2021. Contact information: [place] [address] [phone]         Hemodialysis center Follow up on 08/20/2021.   Why: Continue your regular hemodialysis appointments on Tuesdays, Thursdays and Saturdays.                 Home Health: None    Equipment/Devices: None    Discharge Condition: Improved and stable.   Code Status: Full Code Diet recommendation:  Discharge Diet Orders (From admission, onward)     Start     Ordered   08/18/21 0000  Diet - low sodium heart healthy        08/18/21 0956             Discharge Diagnoses:  Principal Problem:   Noncompliance with renal dialysis Active Problems:   Hyperkalemia   ESRD (end stage renal disease) on dialysis ([place])   Essential hypertension   Mediastinal mass   PAF (paroxysmal atrial fibrillation) (HCC)   History of cardiac arrest   Brief Summary: 61 year old male with PMH of ESRD on TTS HD, chronic diastolic CHF, HTN, pulmonary HTN, chronic respiratory failure with hypoxia on home oxygen 3-5 L/min, anemia, venous stasis, presented due to missed outpatient hemodialysis.  He was recently admitted from 7/24 - 7/29 for elective bronchoscopy for evaluation of a lung nodule when he suffered a bradycardic  arrest postprocedure.  He reports residual discomfort of left chest wall due to CPR but no otherwise no specific complaints.  As per admitting MDs note, patient reported that he has an agreement with nephrology that whenever he missed 2 HD sessions, he will just come to the ED for HD.  As per ED triage and EDP note, patient arrived via [facility] from side of the road.  Admitted for further evaluation and management.  Assessment and plan:  ESRD on TTS HD, presented with volume overload, uremia, metabolic acidosis and hyperkalemia: Nephrology was consulted.  Patient reported that he had been struggling with diarrhea every 1-2 hours and thereby missed HD due to that.  He reportedly missed 2-3 sessions of outpatient dialysis.  On admission, potassium 7.6 and BUN >130.  Treated with Lokelma in the ED.  He underwent urgent hemodialysis on day of hospital arrival 8/7 when 4 L ultrafiltration was done.  Nephrology followed up today, underwent second round of HD which is his usual dialysis day and 3 L ultrafiltration was targeted.  He is now back on his HD schedule.  Hyperkalemia resolved.  Clinically improved.  Nephrology cleared him for discharge home.  They will order Imodium prior to HD to address his diarrhea which will need to be evaluated as outpatient.  Medical noncompliance: Per nephrology, missing too many dialysis sessions as outpatient.  Reportedly has chronic diarrhea which has  been worse lately per patient and may be the main reason for missing HD.  They discussed premedicating with Imodium and having it is a standing order as outpatient.  No diarrhea today.  Counseled importance of being compliant with all aspects of medical care.  He verbalized understanding.  Chronic diarrhea: Unclear etiology.  Will need further evaluation as outpatient as deemed necessary.  Recent PEA arrest: Reportedly brief arrest after sedation/bronchoscopy.  Still has chest wall tenderness.  Although HS Troponin's elevated, 147  > 167, these are down from 481 on 7/24 and likely trending down.  No concern for ACS at this time.  Supportive care.  Has outpatient follow-up with cardiology next month.  Hypertension: Uncontrolled likely due to hypovolemia.  Offloaded volume across HD.  Blood pressures better.  Continue home carvedilol.  Anemia of ESRD: Stable.  Outpatient follow-up at HD  Metabolic bone disease: Management per nephrology  Pulmonary hypertension/chronic respiratory failure with hypoxia: Verified with patient that he is on 3-5 L/min home oxygen.  Continue home meds.  Outpatient follow-up.  Mediastinal mass: S/p bronchoscopy 7/24.  As per H&P, no evidence of malignancy on path.  Outpatient follow-up with pulmonology with plans for repeating CT scan.  Paroxysmal A-fib: Controlled ventricular rate.  Patient consistently refuses Eliquis and has not taken it.  In sinus rhythm in ED.    Consultations: Nephrology  Procedures: HD on 8/7 and 8/8   Discharge Instructions  Discharge Instructions     (HEART FAILURE PATIENTS) Call MD:  Anytime you have any of the following symptoms: 1) 3 pound weight gain in 24 hours or 5 pounds in 1 week 2) shortness of breath, with or without a dry hacking cough 3) swelling in the hands, feet or stomach 4) if you have to sleep on extra pillows at night in order to breathe.   Complete by: As directed    Call MD for:  difficulty breathing, headache or visual disturbances   Complete by: As directed    Call MD for:  extreme fatigue   Complete by: As directed    Call MD for:  persistant dizziness or light-headedness   Complete by: As directed    Call MD for:  persistant nausea and vomiting   Complete by: As directed    Call MD for:  severe uncontrolled pain   Complete by: As directed    Call MD for:  temperature >100.4   Complete by: As directed    Diet - low sodium heart healthy   Complete by: As directed    Discharge instructions   Complete by: As directed    Your  outpatient dialysis team will prescribe Imodium as needed prior to hemodialysis.   Increase activity slowly   Complete by: As directed         Medication List     STOP taking these medications    apixaban 5 MG Tabs tablet Commonly known as: ELIQUIS       TAKE these medications    albuterol 108 (90 Base) MCG/ACT inhaler Commonly known as: VENTOLIN HFA Inhale 2 puffs into the lungs every 6 (six) hours as needed for wheezing or shortness of breath.   carvedilol 6.25 MG tablet Commonly known as: COREG Take 1 tablet (6.25 mg total) by mouth 2 (two) times daily.   multivitamin Tabs tablet Take 1 tablet by mouth every morning.   OXYGEN Inhale 3 L into the lungs as needed (shortness of breath).   polyvinyl alcohol 1.4 % ophthalmic solution Commonly  known as: LIQUIFILM TEARS Place 1 drop into both eyes daily as needed for dry eyes.   Trelegy Ellipta 100-62.5-25 MCG/ACT Aepb Generic drug: Fluticasone-Umeclidin-Vilant Inhale 1 puff into the lungs daily.       Allergies  Allergen Reactions   Lisinopril Cough      Procedures/Studies: DG Chest Port 1 View  Result Date: 08/17/2021 CLINICAL DATA:  Hypoxia EXAM: PORTABLE CHEST 1 VIEW COMPARISON:  August 03, 2021 FINDINGS: Left perihilar mass is stable. The heart size is enlarged. Both lungs are otherwise clear. The visualized skeletal structures are unremarkable. IMPRESSION: Left perihilar mass unchanged. Electronically Signed   By: Abelardo Diesel M.D.   On: 08/17/2021 11:14    Subjective: Seen this morning prior to HD.  Reports breathing is at baseline.  Reports some chronic dyspnea on exertion but no worse at this time.  Reproducible precordial chest pain from recent CPR.  No cough.  No other complaints reported.  Did not have diarrhea today.  Discharge Exam:  Vitals:   08/18/21 1130 08/18/21 1200 08/18/21 1226 08/18/21 1441  BP: (!) 181/121 (!) 137/98 133/88 (!) 145/89  Pulse: 84 83 81 84  Resp: 17 16 (!) 21 19   Temp:   97.7 F (36.5 C) 97.9 F (36.6 C)  TempSrc:   Oral Oral  SpO2: 90% 100% 96% 99%  Weight:   85.8 kg   Height:        General: Pleasant young male, moderately built and nourished sitting up comfortably in bed without distress. Cardiovascular: S1 & S2 heard, RRR, S1/S2 +. No rubs, gallops or clicks.  Trace bilateral leg edema.?  JVD.  Systolic murmur 2/6 at apex.  Telemetry personally reviewed in sinus rhythm. Respiratory: Clear to auscultation without wheezing, rhonchi or crackles. No increased work of breathing.  Reproducible precordial chest wall tenderness. Abdominal:  Non distended, non tender & soft. No organomegaly or masses appreciated. Normal bowel sounds heard. CNS: Alert and oriented. No focal deficits. Extremities: no edema, no cyanosis. Cool b/l feet. Well felt R dorsalis pedis pulse but diminished left one.    The results of significant diagnostics from this hospitalization (including imaging, microbiology, ancillary and laboratory) are listed below for reference.     Microbiology: Recent Results (from the past 240 hour(s))  Resp Panel by RT-PCR (Flu A&B, Covid) Anterior Nasal Swab     Status: None   Collection Time: 08/17/21 10:52 AM   Specimen: Anterior Nasal Swab  Result Value Ref Range Status   SARS Coronavirus 2 by RT PCR NEGATIVE NEGATIVE Final    Comment: (NOTE) SARS-CoV-2 target nucleic acids are NOT DETECTED.  The SARS-CoV-2 RNA is generally detectable in upper respiratory specimens during the acute phase of infection. The lowest concentration of SARS-CoV-2 viral copies this assay can detect is 138 copies/mL. A negative result does not preclude SARS-Cov-2 infection and should not be used as the sole basis for treatment or other patient management decisions. A negative result may occur with  improper specimen collection/handling, submission of specimen other than nasopharyngeal swab, presence of viral mutation(s) within the areas targeted by this  assay, and inadequate number of viral copies(<138 copies/mL). A negative result must be combined with clinical observations, patient history, and epidemiological information. The expected result is Negative.  Fact Sheet for Patients:  EntrepreneurPulse.com.au  Fact Sheet for Healthcare Providers:  IncredibleEmployment.be  This test is no t yet approved or cleared by the [place] FDA and  has been authorized for detection and/or diagnosis of SARS-CoV-2  by FDA under an Emergency Use Authorization (EUA). This EUA will remain  in effect (meaning this test can be used) for the duration of the COVID-19 declaration under Section 564(b)(1) of the Act, 21 U.S.C.section 360bbb-3(b)(1), unless the authorization is terminated  or revoked sooner.       Influenza A by PCR NEGATIVE NEGATIVE Final   Influenza B by PCR NEGATIVE NEGATIVE Final    Comment: (NOTE) The Xpert Xpress SARS-CoV-2/FLU/RSV plus assay is intended as an aid in the diagnosis of influenza from Nasopharyngeal swab specimens and should not be used as a sole basis for treatment. Nasal washings and aspirates are unacceptable for Xpert Xpress SARS-CoV-2/FLU/RSV testing.  Fact Sheet for Patients: EntrepreneurPulse.com.au  Fact Sheet for Healthcare Providers: IncredibleEmployment.be  This test is not yet approved or cleared by the [place] FDA and has been authorized for detection and/or diagnosis of SARS-CoV-2 by FDA under an Emergency Use Authorization (EUA). This EUA will remain in effect (meaning this test can be used) for the duration of the COVID-19 declaration under Section 564(b)(1) of the Act, 21 U.S.C. section 360bbb-3(b)(1), unless the authorization is terminated or revoked.  Performed at [facility], [place] [address] [place], [place] [address]      Labs: CBC: Recent Labs  Lab 08/17/21 1055 08/17/21 1107 08/18/21 0232   WBC 7.2  --  9.4  NEUTROABS 6.0  --   --   HGB 10.4* 11.2* 11.0*  HCT 29.9* 33.0* 32.6*  MCV 90.3  --  92.4  PLT 241  --  637    Basic Metabolic Panel: Recent Labs  Lab 08/17/21 1107 08/17/21 1230 08/18/21 0232  NA 127* 133* 136  K 7.6* 5.8* 4.1  CL 96* 96* 98  CO2  --  17* 21*  GLUCOSE 113* 147* 133*  BUN >130* 161* 85*  CREATININE 16.40* 14.49* 8.44*  CALCIUM  --  9.5 8.1*  MG  --  2.7*  --     Liver Function Tests: Recent Labs  Lab 08/17/21 1230  AST 37  ALT 56*  ALKPHOS 51  BILITOT 0.9  PROT 5.8*  ALBUMIN 2.9*    CBG: Recent Labs  Lab 08/17/21 1154  GLUCAP 93      Time coordinating discharge: 25 minutes  SIGNED:  Vernell Leep, MD,  FACP, [facility], [facility], [facility] (Care Management Physician Certified). [facility] Advisor  To contact the attending provider between 7A-7P or the covering provider during after hours 7P-7A, please log into the web site [email or web address] and access using universal [place] password for that web site. If you do not have the password, please call the hospital operator.

## 2021-08-18 NOTE — Progress Notes (Signed)
Received patient in bed to unit.  Alert and oriented.  Informed consent obtained  Treatment initiated: 0832 Treatment completed: 1225  Patient tolerated well.  Transported back to the room  alert, without acute distress.  Hand-off given to patient's nurse.   Access used: fistula right upper arm Access issues: none  Total UF removed: 3 liters Medication(s) given: none Post HD VS: BP 133/88 MAP 100 HR 81 RR 20 Sat 96% on 3 liters nasal cannula Temp oral 97.7 Post HD weight: 85.8 kg   [facility]

## 2021-08-18 NOTE — Progress Notes (Addendum)
[facility] Progress Note   Subjective: Seen in HD unit. UF goal 3L. Tolerating UF. C/o of chest soreness from chest compressions done last admission. Denies dyspnea, nausea/vomiting, diarrhea.   Objective Vitals:   08/18/21 0745 08/18/21 0820 08/18/21 0832 08/18/21 0900  BP: (!) 158/112 (!) 146/103 (!) 145/102 (!) 155/110  Pulse: 73 75 75 83  Resp: '20 17 18 '$ (!) 24  Temp:  97.9 F (36.6 C)    TempSrc:  Oral    SpO2: 97% 96% 99% 99%  Weight:  90.2 kg    Height:         Additional Objective Labs: Basic Metabolic Panel: Recent Labs  Lab 08/17/21 1107 08/17/21 1230 08/18/21 0232  NA 127* 133* 136  K 7.6* 5.8* 4.1  CL 96* 96* 98  CO2  --  17* 21*  GLUCOSE 113* 147* 133*  BUN >130* 161* 85*  CREATININE 16.40* 14.49* 8.44*  CALCIUM  --  9.5 8.1*   CBC: Recent Labs  Lab 08/17/21 1055 08/17/21 1107 08/18/21 0232  WBC 7.2  --  9.4  NEUTROABS 6.0  --   --   HGB 10.4* 11.2* 11.0*  HCT 29.9* 33.0* 32.6*  MCV 90.3  --  92.4  PLT 241  --  172   Blood Culture    Component Value Date/Time   SDES BLOOD RIGHT FOREARM 02/08/2021 1315   SPECREQUEST  02/08/2021 1315    BOTTLES DRAWN AEROBIC AND ANAEROBIC Blood Culture results may not be optimal due to an excessive volume of blood received in culture bottles   CULT  02/08/2021 1315    NO GROWTH 5 DAYS Performed at [facility], [place] [address], [place], [place] [address]    REPTSTATUS 02/13/2021 FINAL 02/08/2021 1315     Physical Exam General: Alert, nad  Heart: RRR  Lungs: Clear bilaterally  Abdomen: soft non-tender  Extremities: Trace LE edema Dialysis Access: R AVF in use on HD   Medications:   carvedilol  6.25 mg Oral BID   Chlorhexidine Gluconate Cloth  6 each Topical Q0600   Chlorhexidine Gluconate Cloth  6 each Topical Q0600   fluticasone furoate-vilanterol  1 puff Inhalation Daily   And   umeclidinium bromide  1 puff Inhalation Daily   heparin  5,000 Units Subcutaneous Q8H    multivitamin  1 tablet Oral QHS   sodium chloride flush  3 mL Intravenous Q12H     Home meds include - apixaban, carveilol, trelegy ellipta, renavite, 3L home, prns/ vits/ supps    OP HD: [facility] [place]  4h 39mn   400/800   76kg  2/2 bath   RUA AVF  Hep 6000       Assessment/ Plan: Uremia/ hyperkalemia - missed HD x 2-3 sessions. High K+ 7.6, Bun >130. Had urgent HD yesterday. Now improved. Back on schedule for HD today.   Noncompliance - missing too much dialysis, has chronic diarrhea which has been worse lately per the pt and that is the main reason he is missing HD. Discussed pre medication with Imodium. Will see if can have standing order at outpatient unit.  Recent PEA arrest - brief arrest after sedation/ bronch, admitted here and was dc'd 7/29.  ESRD - on HD TTS. HD Monday and today.  HTN/ volume - BP's and vol up on exam. New UF 4L with HD yesterday. Attempting another 3L today  Anemia esrd - Hb 1- 11, got esa darbe 100 ug here on 7/25. No esa needs.  MBD ckd - Ca in range, will add on phos. Cont iv vdra w/ hd tiw. Get records.  Dispo: Ok for discharge after HD   Lynnda Child PA-C [place] 08/18/2021,9:12 AM  Pt seen, examined and agree w A/P as above.  Kelly Splinter  MD 08/18/2021, 3:57 PM

## 2021-08-18 NOTE — Progress Notes (Signed)
D/C order noted. Contacted [facility] to advise clinic of pt's d/c today and that pt should resume care on Thursday.   Melven Sartorius Renal Navigator [phone]

## 2021-08-18 NOTE — ED Notes (Signed)
Consent for HD signed and on pt bed. MD [facility] requesting RN to message him after HD, possibility of pt d/c.

## 2021-08-18 NOTE — Discharge Instructions (Signed)

## 2021-08-18 NOTE — ED Notes (Signed)
Pt transported to HD.

## 2021-08-21 DIAGNOSIS — I5033 Acute on chronic diastolic (congestive) heart failure: Secondary | ICD-10-CM | POA: Diagnosis not present

## 2021-08-21 DIAGNOSIS — R0902 Hypoxemia: Secondary | ICD-10-CM | POA: Diagnosis not present

## 2021-08-21 DIAGNOSIS — R531 Weakness: Secondary | ICD-10-CM | POA: Diagnosis not present

## 2021-08-22 DIAGNOSIS — N2581 Secondary hyperparathyroidism of renal origin: Secondary | ICD-10-CM | POA: Diagnosis not present

## 2021-08-22 DIAGNOSIS — N186 End stage renal disease: Secondary | ICD-10-CM | POA: Diagnosis not present

## 2021-08-22 DIAGNOSIS — Z992 Dependence on renal dialysis: Secondary | ICD-10-CM | POA: Diagnosis not present

## 2021-08-22 NOTE — Progress Notes (Signed)
Team,  This gentleman needs a repeat CT that is scheduled September 20. He needs a follow-up appointment to see me or SG, NP in clinic after the CT is complete   Thanks,  [place], DO [facility] 08/22/2021 2:40 PM

## 2021-08-23 ENCOUNTER — Emergency Department (HOSPITAL_COMMUNITY)
Admission: EM | Admit: 2021-08-23 | Discharge: 2021-09-11 | Disposition: E | Payer: Medicare HMO | Attending: Emergency Medicine | Admitting: Emergency Medicine

## 2021-08-23 DIAGNOSIS — R140 Abdominal distension (gaseous): Secondary | ICD-10-CM | POA: Diagnosis not present

## 2021-08-23 DIAGNOSIS — Z79899 Other long term (current) drug therapy: Secondary | ICD-10-CM | POA: Diagnosis not present

## 2021-08-23 DIAGNOSIS — I509 Heart failure, unspecified: Secondary | ICD-10-CM | POA: Diagnosis not present

## 2021-08-23 DIAGNOSIS — I132 Hypertensive heart and chronic kidney disease with heart failure and with stage 5 chronic kidney disease, or end stage renal disease: Secondary | ICD-10-CM | POA: Insufficient documentation

## 2021-08-23 DIAGNOSIS — N186 End stage renal disease: Secondary | ICD-10-CM | POA: Insufficient documentation

## 2021-08-23 DIAGNOSIS — I499 Cardiac arrhythmia, unspecified: Secondary | ICD-10-CM | POA: Diagnosis not present

## 2021-08-23 DIAGNOSIS — I770 Arteriovenous fistula, acquired: Secondary | ICD-10-CM | POA: Diagnosis not present

## 2021-08-23 DIAGNOSIS — R404 Transient alteration of awareness: Secondary | ICD-10-CM | POA: Diagnosis not present

## 2021-08-23 DIAGNOSIS — I469 Cardiac arrest, cause unspecified: Secondary | ICD-10-CM | POA: Diagnosis not present

## 2021-08-23 DIAGNOSIS — I4901 Ventricular fibrillation: Secondary | ICD-10-CM | POA: Diagnosis not present

## 2021-08-24 MED FILL — Medication: Qty: 1 | Status: AC

## 2021-08-24 NOTE — ED Notes (Signed)
The pts daughter arrived wanting to see the pts body.  House coverage was called  his response was that no one is taken to the morgue to see anyone there  this info was passed on to the daughter  she was very upset about nunerus things  I gave her the number to the non-emergency ems number and the non-emergency number to the [place] police dept   to get the information she had questions she had about her fathers death  she last spoke to  her father yesterday afternoon.

## 2021-09-11 DIAGNOSIS — 419620001 Death: Secondary | SNOMED CT | POA: Diagnosis not present

## 2021-09-11 NOTE — ED Provider Notes (Signed)
[facility] EMERGENCY DEPARTMENT Provider Note   CSN: [id number] Arrival date & time: 09/18/21  04/19/2116     History  No chief complaint on file.   Jason [name]. is a 61 y.o. male.  61 year old male with a history of ESRD, CHF, hypertension, chronic hypoxia on 2 L nasal cannula who presented to the emergency department as a cardiac arrest that was declared deceased in the field by EMS.  Per EMS, the patient had a seizure-like activity at a gas station.  They found him he was in PEA arrest.  He received approximately 30 minutes of ACLS with 6 rounds of epinephrine, calcium, 2 rounds of bicarb.  Reportedly was in a shockable rhythm and was shocked twice.  EMS called the time of death at 04/19/2028.  Per police there was no drug paraphernalia around her concerns for foul play at this time.  Next of kin was not yet contacted by EMS.  Police state that the patient's car is still at the [place] at 04/19/[address] [place] Medications Prior to Admission medications   Medication Sig Start Date End Date Taking? Authorizing Provider  albuterol (VENTOLIN HFA) 108 (90 Base) MCG/ACT inhaler Inhale 2 puffs into the lungs every 6 (six) hours as needed for wheezing or shortness of breath. 07/06/21   Icard, Leory Plowman L, DO  carvedilol (COREG) 6.25 MG tablet Take 1 tablet (6.25 mg total) by mouth 2 (two) times daily. 07/22/21   Lelon Perla, MD  Fluticasone-Umeclidin-Vilant (TRELEGY ELLIPTA) 100-62.5-25 MCG/ACT AEPB Inhale 1 puff into the lungs daily. 07/06/21 10/04/21  Garner Nash, DO  multivitamin (RENA-VIT) TABS tablet Take 1 tablet by mouth every morning. 10/20/17   [provider]  OXYGEN Inhale 3 L into the lungs as needed (shortness of breath).    [provider]  polyvinyl alcohol (LIQUIFILM TEARS) 1.4 % ophthalmic solution Place 1 drop into both eyes daily as needed for dry eyes.    [provider]      Allergies    Lisinopril     Review of Systems   Review of Systems  Physical Exam Updated Vital Signs There were no vitals taken for this visit. Physical Exam Constitutional:      Appearance: He is obese.     Comments: Patient deceased.  HENT:     Head: Normocephalic and atraumatic.     Right Ear: External ear normal.     Left Ear: External ear normal.     Nose: Nose normal.     Mouth/Throat:     Mouth: Mucous membranes are dry.     Pharynx: Oropharynx is clear.     Comments: King airway in place Eyes:     General: No scleral icterus.    Comments: Pupils 5 mm and fixed bilaterally  Cardiovascular:     Comments: No cardiac activity auscultated.  Pulseless and in femoral and carotid pulses. Pulmonary:     Comments: No respirations auscultated Abdominal:     General: There is distension.     Palpations: There is no mass.  Musculoskeletal:        General: No deformity. Normal range of motion.     Cervical back: Neck supple.     Comments: AV fistula on right upper extremity.  Bilateral tibial IO's in place.  Neurological:     Comments: No response to painful stimuli. No spontaneous movement.      ED  Results / Procedures / Treatments   Labs (all labs ordered are listed, but only abnormal results are displayed) Labs Reviewed - No data to display  EKG None  Radiology No results found.  Procedures Procedures   Medications Ordered in ED Medications - No data to display  ED Course/ Medical Decision Making/ A&P Clinical Course as of September 20, 2021 2143  Sun 09-20-21 Discussed with medical examiner Jason [name] who does not feel that this is a [employer] case.  Feels that the patient likely died from atherosclerotic heart disease.  Attempted to call patient's next of kin Jason [name] twice without success. [RP]    Clinical Course User Index [RP] Fransico Meadow, MD                           Medical Decision Making  Jason Huy. is a 61 y.o. male with history of CHF, ESRD,  hypertension, and chronic hypoxia who presented as a cardiac arrest that was called in the field.  Initial DDx: Hyperkalemia, ACS, hypoxia due to seizure-like activity, arrhythmia  ED Summary:  Discussed case with the medical examiner who did feel that this was likely due to medical causes.  Thus the medical examiner did not take the case.  Feels that the patient likely died of atherosclerotic heart disease.  Next of kin was attempted to be notified about the condition of her father as well as the location of his car noted in the HPI but she did not pick up.  We will transport the patient to the morgue family is unable to be contacted.  Consults:  Medical examiner  Additional history obtained from EMS  and police Records reviewed previous admission documents - prior cardiac arrest   Final Clinical Impression(s) / ED Diagnoses Final diagnoses:  Cardiac arrest [facility])    Rx / DC Orders ED Discharge Orders     None         Fransico Meadow, MD 2021/09/20 2143

## 2021-09-11 NOTE — ED Notes (Signed)
Pt BIB EMS, DOA, no report received.

## 2021-09-11 NOTE — ED Notes (Signed)
Per Dr. Sabra Heck pt is not an ME case and pt is to go directly to the morgue. Post mortem care performed. Pt valuables inventoried and placed in secure lock up.  (1 phone, $5.07 cash, 1 wallet with multiple cards). Pt transported to morgue.

## 2021-09-11 DEATH — deceased

## 2021-09-18 ENCOUNTER — Ambulatory Visit: Payer: Medicare HMO | Admitting: Cardiology

## 2021-09-30 ENCOUNTER — Other Ambulatory Visit (HOSPITAL_COMMUNITY): Payer: Medicare HMO
